# Patient Record
Sex: Female | Born: 1935
Health system: Southern US, Community
[De-identification: ages and names within clinical notes are randomized; demographics above are authoritative.]

## PROBLEM LIST (undated history)

## (undated) DIAGNOSIS — C8590 Non-Hodgkin lymphoma, unspecified, unspecified site: Secondary | ICD-10-CM

## (undated) DIAGNOSIS — I10 Essential (primary) hypertension: Secondary | ICD-10-CM

## (undated) DIAGNOSIS — Z972 Presence of dental prosthetic device (complete) (partial): Secondary | ICD-10-CM

## (undated) DIAGNOSIS — L304 Erythema intertrigo: Secondary | ICD-10-CM

## (undated) DIAGNOSIS — I5189 Other ill-defined heart diseases: Secondary | ICD-10-CM

## (undated) DIAGNOSIS — K635 Polyp of colon: Secondary | ICD-10-CM

## (undated) DIAGNOSIS — I2510 Atherosclerotic heart disease of native coronary artery without angina pectoris: Secondary | ICD-10-CM

## (undated) DIAGNOSIS — K5730 Diverticulosis of large intestine without perforation or abscess without bleeding: Secondary | ICD-10-CM

## (undated) DIAGNOSIS — C801 Malignant (primary) neoplasm, unspecified: Secondary | ICD-10-CM

## (undated) DIAGNOSIS — N200 Calculus of kidney: Secondary | ICD-10-CM

## (undated) DIAGNOSIS — I351 Nonrheumatic aortic (valve) insufficiency: Secondary | ICD-10-CM

## (undated) DIAGNOSIS — N1832 Chronic kidney disease, stage 3b: Secondary | ICD-10-CM

## (undated) DIAGNOSIS — E785 Hyperlipidemia, unspecified: Secondary | ICD-10-CM

## (undated) DIAGNOSIS — K6289 Other specified diseases of anus and rectum: Secondary | ICD-10-CM

## (undated) DIAGNOSIS — M1990 Unspecified osteoarthritis, unspecified site: Secondary | ICD-10-CM

## (undated) DIAGNOSIS — Z974 Presence of external hearing-aid: Secondary | ICD-10-CM

## (undated) DIAGNOSIS — T7840XA Allergy, unspecified, initial encounter: Secondary | ICD-10-CM

## (undated) DIAGNOSIS — Z860100 Personal history of colon polyps, unspecified: Secondary | ICD-10-CM

## (undated) DIAGNOSIS — I4891 Unspecified atrial fibrillation: Secondary | ICD-10-CM

## (undated) DIAGNOSIS — T753XXA Motion sickness, initial encounter: Secondary | ICD-10-CM

## (undated) DIAGNOSIS — Z9289 Personal history of other medical treatment: Secondary | ICD-10-CM

## (undated) DIAGNOSIS — H409 Unspecified glaucoma: Secondary | ICD-10-CM

## (undated) DIAGNOSIS — K219 Gastro-esophageal reflux disease without esophagitis: Secondary | ICD-10-CM

## (undated) DIAGNOSIS — I517 Cardiomegaly: Secondary | ICD-10-CM

## (undated) DIAGNOSIS — D649 Anemia, unspecified: Secondary | ICD-10-CM

## (undated) DIAGNOSIS — I82409 Acute embolism and thrombosis of unspecified deep veins of unspecified lower extremity: Secondary | ICD-10-CM

## (undated) HISTORY — DX: Anemia, unspecified: D64.9

## (undated) HISTORY — DX: Atherosclerotic heart disease of native coronary artery without angina pectoris: I25.10

## (undated) HISTORY — DX: Gastro-esophageal reflux disease without esophagitis: K21.9

## (undated) HISTORY — DX: Diverticulosis of large intestine without perforation or abscess without bleeding: K57.30

## (undated) HISTORY — DX: Allergy, unspecified, initial encounter: T78.40XA

## (undated) HISTORY — DX: Other specified diseases of anus and rectum: K62.89

## (undated) HISTORY — DX: Calculus of kidney: N20.0

## (undated) HISTORY — DX: Personal history of other medical treatment: Z92.89

## (undated) HISTORY — DX: Hyperlipidemia, unspecified: E78.5

## (undated) HISTORY — DX: Chronic kidney disease, stage 3b: N18.32

## (undated) HISTORY — DX: Personal history of colon polyps, unspecified: Z86.0100

## (undated) HISTORY — PX: CATARACT EXTRACTION: SUR2

## (undated) HISTORY — DX: Personal history of colonic polyps: Z86.010

## (undated) HISTORY — DX: Essential (primary) hypertension: I10

## (undated) HISTORY — PX: VAGINAL HYSTERECTOMY: SUR661

## (undated) HISTORY — DX: Acute embolism and thrombosis of unspecified deep veins of unspecified lower extremity: I82.409

## (undated) HISTORY — DX: Non-Hodgkin lymphoma, unspecified, unspecified site: C85.90

## (undated) HISTORY — DX: Polyp of colon: K63.5

## (undated) HISTORY — DX: Unspecified osteoarthritis, unspecified site: M19.90

## (undated) HISTORY — PX: CORONARY ANGIOPLASTY WITH STENT PLACEMENT: SHX49

## (undated) HISTORY — PX: OTHER SURGICAL HISTORY: SHX169

## (undated) HISTORY — DX: Unspecified glaucoma: H40.9

---
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
## 2000-05-09 ENCOUNTER — Encounter: Payer: Self-pay | Admitting: Gastroenterology

## 2001-02-28 ENCOUNTER — Other Ambulatory Visit: Admission: RE | Admit: 2001-02-28 | Discharge: 2001-02-28 | Payer: Self-pay | Admitting: Family Medicine

## 2001-09-19 ENCOUNTER — Other Ambulatory Visit: Admission: RE | Admit: 2001-09-19 | Discharge: 2001-09-19 | Payer: Self-pay | Admitting: Gastroenterology

## 2001-09-19 ENCOUNTER — Encounter (INDEPENDENT_AMBULATORY_CARE_PROVIDER_SITE_OTHER): Payer: Self-pay | Admitting: Specialist

## 2001-12-06 ENCOUNTER — Ambulatory Visit (HOSPITAL_COMMUNITY): Admission: RE | Admit: 2001-12-06 | Discharge: 2001-12-06 | Payer: Self-pay | Admitting: Gastroenterology

## 2001-12-06 ENCOUNTER — Encounter (INDEPENDENT_AMBULATORY_CARE_PROVIDER_SITE_OTHER): Payer: Self-pay | Admitting: Specialist

## 2001-12-26 ENCOUNTER — Ambulatory Visit (HOSPITAL_COMMUNITY): Admission: RE | Admit: 2001-12-26 | Discharge: 2001-12-26 | Payer: Self-pay | Admitting: Oncology

## 2001-12-26 ENCOUNTER — Encounter: Payer: Self-pay | Admitting: Oncology

## 2002-02-06 ENCOUNTER — Other Ambulatory Visit: Admission: RE | Admit: 2002-02-06 | Discharge: 2002-02-06 | Payer: Self-pay | Admitting: Oncology

## 2002-02-06 ENCOUNTER — Encounter (INDEPENDENT_AMBULATORY_CARE_PROVIDER_SITE_OTHER): Payer: Self-pay

## 2002-03-30 ENCOUNTER — Ambulatory Visit: Admission: RE | Admit: 2002-03-30 | Discharge: 2002-06-28 | Payer: Self-pay | Admitting: Radiation Oncology

## 2002-09-18 ENCOUNTER — Ambulatory Visit (HOSPITAL_COMMUNITY): Admission: RE | Admit: 2002-09-18 | Discharge: 2002-09-18 | Payer: Self-pay | Admitting: Oncology

## 2002-09-18 ENCOUNTER — Encounter: Payer: Self-pay | Admitting: Oncology

## 2003-01-22 ENCOUNTER — Ambulatory Visit (HOSPITAL_COMMUNITY): Admission: RE | Admit: 2003-01-22 | Discharge: 2003-01-22 | Payer: Self-pay | Admitting: Oncology

## 2003-01-22 ENCOUNTER — Encounter: Payer: Self-pay | Admitting: Oncology

## 2003-05-20 ENCOUNTER — Ambulatory Visit (HOSPITAL_COMMUNITY): Admission: RE | Admit: 2003-05-20 | Discharge: 2003-05-20 | Payer: Self-pay | Admitting: Oncology

## 2003-05-20 ENCOUNTER — Encounter: Payer: Self-pay | Admitting: Oncology

## 2003-07-22 ENCOUNTER — Encounter (INDEPENDENT_AMBULATORY_CARE_PROVIDER_SITE_OTHER): Payer: Self-pay | Admitting: Specialist

## 2003-07-22 ENCOUNTER — Ambulatory Visit (HOSPITAL_COMMUNITY): Admission: RE | Admit: 2003-07-22 | Discharge: 2003-07-22 | Payer: Self-pay | Admitting: Oncology

## 2003-08-20 ENCOUNTER — Encounter: Payer: Self-pay | Admitting: Oncology

## 2003-08-20 ENCOUNTER — Ambulatory Visit (HOSPITAL_COMMUNITY): Admission: RE | Admit: 2003-08-20 | Discharge: 2003-08-20 | Payer: Self-pay | Admitting: Oncology

## 2003-10-16 ENCOUNTER — Encounter: Payer: Self-pay | Admitting: Gastroenterology

## 2003-10-30 HISTORY — PX: KNEE SURGERY: SHX244

## 2003-12-17 ENCOUNTER — Ambulatory Visit (HOSPITAL_COMMUNITY): Admission: RE | Admit: 2003-12-17 | Discharge: 2003-12-17 | Payer: Self-pay | Admitting: Oncology

## 2003-12-30 ENCOUNTER — Ambulatory Visit (HOSPITAL_COMMUNITY): Admission: RE | Admit: 2003-12-30 | Discharge: 2003-12-30 | Payer: Self-pay | Admitting: Orthopedic Surgery

## 2003-12-30 ENCOUNTER — Ambulatory Visit (HOSPITAL_BASED_OUTPATIENT_CLINIC_OR_DEPARTMENT_OTHER): Admission: RE | Admit: 2003-12-30 | Discharge: 2003-12-30 | Payer: Self-pay | Admitting: Orthopedic Surgery

## 2004-03-10 ENCOUNTER — Ambulatory Visit (HOSPITAL_COMMUNITY): Admission: RE | Admit: 2004-03-10 | Discharge: 2004-03-10 | Payer: Self-pay | Admitting: Oncology

## 2004-08-31 ENCOUNTER — Ambulatory Visit (HOSPITAL_COMMUNITY): Admission: RE | Admit: 2004-08-31 | Discharge: 2004-08-31 | Payer: Self-pay | Admitting: Oncology

## 2004-09-30 ENCOUNTER — Ambulatory Visit: Payer: Self-pay | Admitting: Gastroenterology

## 2004-11-22 ENCOUNTER — Ambulatory Visit: Payer: Self-pay | Admitting: Internal Medicine

## 2004-11-23 ENCOUNTER — Inpatient Hospital Stay (HOSPITAL_COMMUNITY): Admission: EM | Admit: 2004-11-23 | Discharge: 2004-11-25 | Payer: Self-pay | Admitting: Emergency Medicine

## 2004-11-23 ENCOUNTER — Ambulatory Visit: Payer: Self-pay | Admitting: Cardiology

## 2004-12-01 ENCOUNTER — Ambulatory Visit: Payer: Self-pay

## 2004-12-01 ENCOUNTER — Ambulatory Visit: Payer: Self-pay | Admitting: Cardiology

## 2004-12-05 ENCOUNTER — Ambulatory Visit: Payer: Self-pay | Admitting: Family Medicine

## 2004-12-07 ENCOUNTER — Ambulatory Visit: Payer: Self-pay | Admitting: Internal Medicine

## 2004-12-07 ENCOUNTER — Ambulatory Visit: Payer: Self-pay | Admitting: Cardiology

## 2004-12-14 ENCOUNTER — Ambulatory Visit: Payer: Self-pay | Admitting: Internal Medicine

## 2004-12-17 ENCOUNTER — Ambulatory Visit: Payer: Self-pay | Admitting: Internal Medicine

## 2004-12-28 ENCOUNTER — Ambulatory Visit: Payer: Self-pay | Admitting: Oncology

## 2004-12-30 ENCOUNTER — Ambulatory Visit (HOSPITAL_COMMUNITY): Admission: RE | Admit: 2004-12-30 | Discharge: 2004-12-30 | Payer: Self-pay | Admitting: Oncology

## 2005-01-18 ENCOUNTER — Ambulatory Visit: Payer: Self-pay | Admitting: Internal Medicine

## 2005-02-01 ENCOUNTER — Ambulatory Visit: Payer: Self-pay | Admitting: Family Medicine

## 2005-02-19 ENCOUNTER — Ambulatory Visit: Payer: Self-pay | Admitting: Cardiology

## 2005-02-26 ENCOUNTER — Ambulatory Visit: Payer: Self-pay | Admitting: Cardiology

## 2005-04-19 ENCOUNTER — Ambulatory Visit: Payer: Self-pay | Admitting: Cardiology

## 2005-05-26 ENCOUNTER — Ambulatory Visit: Payer: Self-pay | Admitting: Family Medicine

## 2005-06-14 ENCOUNTER — Ambulatory Visit: Payer: Self-pay | Admitting: Internal Medicine

## 2005-06-25 ENCOUNTER — Ambulatory Visit: Payer: Self-pay | Admitting: Oncology

## 2005-06-30 ENCOUNTER — Ambulatory Visit (HOSPITAL_COMMUNITY): Admission: RE | Admit: 2005-06-30 | Discharge: 2005-06-30 | Payer: Self-pay | Admitting: Oncology

## 2005-07-05 ENCOUNTER — Ambulatory Visit: Payer: Self-pay | Admitting: Family Medicine

## 2005-08-06 ENCOUNTER — Ambulatory Visit: Payer: Self-pay | Admitting: Family Medicine

## 2005-08-16 ENCOUNTER — Ambulatory Visit: Payer: Self-pay | Admitting: Family Medicine

## 2005-08-25 ENCOUNTER — Ambulatory Visit: Payer: Self-pay | Admitting: Cardiology

## 2005-09-01 ENCOUNTER — Ambulatory Visit: Payer: Self-pay | Admitting: Cardiology

## 2005-09-13 ENCOUNTER — Ambulatory Visit: Payer: Self-pay | Admitting: Family Medicine

## 2005-10-01 ENCOUNTER — Ambulatory Visit: Payer: Self-pay | Admitting: Gastroenterology

## 2005-10-18 ENCOUNTER — Ambulatory Visit: Payer: Self-pay | Admitting: Cardiology

## 2005-10-18 ENCOUNTER — Ambulatory Visit: Payer: Self-pay | Admitting: Family Medicine

## 2005-10-25 ENCOUNTER — Ambulatory Visit: Payer: Self-pay | Admitting: Gastroenterology

## 2005-11-15 ENCOUNTER — Ambulatory Visit: Payer: Self-pay | Admitting: Cardiology

## 2005-12-06 ENCOUNTER — Ambulatory Visit: Payer: Self-pay | Admitting: Family Medicine

## 2005-12-27 ENCOUNTER — Ambulatory Visit: Payer: Self-pay | Admitting: Oncology

## 2005-12-29 ENCOUNTER — Ambulatory Visit (HOSPITAL_COMMUNITY): Admission: RE | Admit: 2005-12-29 | Discharge: 2005-12-29 | Payer: Self-pay | Admitting: Oncology

## 2006-01-10 ENCOUNTER — Ambulatory Visit: Payer: Self-pay | Admitting: Family Medicine

## 2006-01-28 ENCOUNTER — Ambulatory Visit: Payer: Self-pay | Admitting: Family Medicine

## 2006-02-23 ENCOUNTER — Ambulatory Visit: Payer: Self-pay | Admitting: Family Medicine

## 2006-03-29 ENCOUNTER — Ambulatory Visit: Payer: Self-pay | Admitting: Family Medicine

## 2006-03-30 ENCOUNTER — Ambulatory Visit: Payer: Self-pay | Admitting: Family Medicine

## 2006-05-16 ENCOUNTER — Ambulatory Visit: Payer: Self-pay | Admitting: Family Medicine

## 2006-06-27 ENCOUNTER — Ambulatory Visit: Payer: Self-pay | Admitting: Oncology

## 2006-06-28 LAB — CBC WITH DIFFERENTIAL/PLATELET
Eosinophils Absolute: 0.1 10*3/uL (ref 0.0–0.5)
MCV: 95.6 fL (ref 81.0–101.0)
MONO%: 8.6 % (ref 0.0–13.0)
NEUT#: 1.8 10*3/uL (ref 1.5–6.5)
RBC: 4.22 10*6/uL (ref 3.70–5.32)
RDW: 13.9 % (ref 11.3–14.5)
WBC: 4.5 10*3/uL (ref 3.9–10.0)

## 2006-06-29 ENCOUNTER — Ambulatory Visit (HOSPITAL_COMMUNITY): Admission: RE | Admit: 2006-06-29 | Discharge: 2006-06-29 | Payer: Self-pay | Admitting: Oncology

## 2006-06-30 LAB — COMPREHENSIVE METABOLIC PANEL
ALT: 39 U/L (ref 0–40)
AST: 31 U/L (ref 0–37)
Albumin: 4.6 g/dL (ref 3.5–5.2)
Alkaline Phosphatase: 60 U/L (ref 39–117)
Glucose, Bld: 170 mg/dL — ABNORMAL HIGH (ref 70–99)
Potassium: 4.5 mEq/L (ref 3.5–5.3)
Sodium: 140 mEq/L (ref 135–145)
Total Protein: 7.3 g/dL (ref 6.0–8.3)

## 2006-06-30 LAB — PROTEIN ELECTROPHORESIS, SERUM
Beta 2: 4.9 % (ref 3.2–6.5)
Beta Globulin: 6.4 % (ref 4.7–7.2)
Gamma Globulin: 13.2 % (ref 11.1–18.8)
M-Spike, %: 0.17 g/dL
Total Protein, Serum Electrophoresis: 7.3 g/dL (ref 6.0–8.3)

## 2006-08-02 ENCOUNTER — Ambulatory Visit: Payer: Self-pay | Admitting: Family Medicine

## 2006-08-10 ENCOUNTER — Ambulatory Visit: Payer: Self-pay | Admitting: Cardiology

## 2006-09-05 ENCOUNTER — Ambulatory Visit: Payer: Self-pay | Admitting: Family Medicine

## 2006-09-06 ENCOUNTER — Ambulatory Visit: Payer: Self-pay | Admitting: Cardiology

## 2006-09-13 ENCOUNTER — Ambulatory Visit: Payer: Self-pay | Admitting: Cardiology

## 2006-09-30 ENCOUNTER — Ambulatory Visit: Payer: Self-pay | Admitting: Family Medicine

## 2006-12-21 ENCOUNTER — Ambulatory Visit: Payer: Self-pay | Admitting: Oncology

## 2006-12-26 LAB — CBC WITH DIFFERENTIAL/PLATELET
EOS%: 1.6 % (ref 0.0–7.0)
MCH: 33.2 pg (ref 26.0–34.0)
MCV: 96.4 fL (ref 81.0–101.0)
MONO%: 7.7 % (ref 0.0–13.0)
NEUT#: 2 10*3/uL (ref 1.5–6.5)
RBC: 4.2 10*6/uL (ref 3.70–5.32)
RDW: 12.8 % (ref 11.3–14.5)

## 2006-12-26 LAB — ERYTHROCYTE SEDIMENTATION RATE: Sed Rate: 7 mm/hr (ref 0–30)

## 2006-12-28 LAB — COMPREHENSIVE METABOLIC PANEL
ALT: 45 U/L — ABNORMAL HIGH (ref 0–35)
AST: 36 U/L (ref 0–37)
Alkaline Phosphatase: 66 U/L (ref 39–117)
Sodium: 143 mEq/L (ref 135–145)
Total Bilirubin: 0.6 mg/dL (ref 0.3–1.2)
Total Protein: 7.1 g/dL (ref 6.0–8.3)

## 2006-12-28 LAB — SPEP & IFE WITH QIG
Alpha-1-Globulin: 3.4 % (ref 2.9–4.9)
Alpha-2-Globulin: 10.7 % (ref 7.1–11.8)
Total Protein, Serum Electrophoresis: 7.1 g/dL (ref 6.0–8.3)

## 2006-12-28 LAB — LACTATE DEHYDROGENASE: LDH: 167 U/L (ref 94–250)

## 2006-12-29 ENCOUNTER — Encounter: Payer: Self-pay | Admitting: Family Medicine

## 2006-12-29 ENCOUNTER — Ambulatory Visit (HOSPITAL_COMMUNITY): Admission: RE | Admit: 2006-12-29 | Discharge: 2006-12-29 | Payer: Self-pay | Admitting: Oncology

## 2007-01-24 ENCOUNTER — Ambulatory Visit: Payer: Self-pay | Admitting: Gastroenterology

## 2007-01-24 LAB — CONVERTED CEMR LAB: Total Protein: 7.2 g/dL (ref 6.0–8.3)

## 2007-02-09 ENCOUNTER — Ambulatory Visit: Payer: Self-pay | Admitting: Gastroenterology

## 2007-02-20 ENCOUNTER — Ambulatory Visit: Payer: Self-pay | Admitting: Family Medicine

## 2007-02-23 ENCOUNTER — Ambulatory Visit: Payer: Self-pay | Admitting: Gastroenterology

## 2007-02-23 LAB — CONVERTED CEMR LAB
Fecal Occult Blood: NEGATIVE
OCCULT 2: NEGATIVE
OCCULT 3: NEGATIVE
OCCULT 4: NEGATIVE
OCCULT 5: NEGATIVE

## 2007-03-06 ENCOUNTER — Ambulatory Visit: Payer: Self-pay | Admitting: Family Medicine

## 2007-04-14 ENCOUNTER — Encounter (INDEPENDENT_AMBULATORY_CARE_PROVIDER_SITE_OTHER): Payer: Self-pay | Admitting: Internal Medicine

## 2007-04-14 LAB — CONVERTED CEMR LAB: Pap Smear: NORMAL

## 2007-04-17 ENCOUNTER — Telehealth (INDEPENDENT_AMBULATORY_CARE_PROVIDER_SITE_OTHER): Payer: Self-pay | Admitting: *Deleted

## 2007-04-21 ENCOUNTER — Ambulatory Visit: Payer: Self-pay | Admitting: Internal Medicine

## 2007-04-21 DIAGNOSIS — S139XXA Sprain of joints and ligaments of unspecified parts of neck, initial encounter: Secondary | ICD-10-CM

## 2007-05-08 ENCOUNTER — Telehealth (INDEPENDENT_AMBULATORY_CARE_PROVIDER_SITE_OTHER): Payer: Self-pay | Admitting: *Deleted

## 2007-05-17 ENCOUNTER — Telehealth (INDEPENDENT_AMBULATORY_CARE_PROVIDER_SITE_OTHER): Payer: Self-pay | Admitting: Internal Medicine

## 2007-06-22 ENCOUNTER — Ambulatory Visit: Payer: Self-pay | Admitting: Oncology

## 2007-06-26 LAB — CBC WITH DIFFERENTIAL/PLATELET
BASO%: 0.3 % (ref 0.0–2.0)
EOS%: 0.9 % (ref 0.0–7.0)
HCT: 38.1 % (ref 34.8–46.6)
HGB: 13.6 g/dL (ref 11.6–15.9)
MCH: 34.3 pg — ABNORMAL HIGH (ref 26.0–34.0)
MCHC: 35.6 g/dL (ref 32.0–36.0)
MONO#: 0.3 10*3/uL (ref 0.1–0.9)
NEUT%: 46 % (ref 39.6–76.8)
RDW: 13.1 % (ref 11.3–14.5)
WBC: 4.6 10*3/uL (ref 3.9–10.0)
lymph#: 2.1 10*3/uL (ref 0.9–3.3)

## 2007-06-27 LAB — COMPREHENSIVE METABOLIC PANEL
ALT: 60 U/L — ABNORMAL HIGH (ref 0–35)
AST: 57 U/L — ABNORMAL HIGH (ref 0–37)
Albumin: 4.3 g/dL (ref 3.5–5.2)
CO2: 25 mEq/L (ref 19–32)
Calcium: 9.8 mg/dL (ref 8.4–10.5)
Chloride: 102 mEq/L (ref 96–112)
Creatinine, Ser: 1.3 mg/dL — ABNORMAL HIGH (ref 0.40–1.20)
Potassium: 5.1 mEq/L (ref 3.5–5.3)
Total Protein: 7.1 g/dL (ref 6.0–8.3)

## 2007-06-27 LAB — BETA 2 MICROGLOBULIN, SERUM: Beta-2 Microglobulin: 2.46 mg/L — ABNORMAL HIGH (ref 1.01–1.73)

## 2007-06-28 ENCOUNTER — Encounter (INDEPENDENT_AMBULATORY_CARE_PROVIDER_SITE_OTHER): Payer: Self-pay | Admitting: Internal Medicine

## 2007-06-28 ENCOUNTER — Ambulatory Visit (HOSPITAL_COMMUNITY): Admission: RE | Admit: 2007-06-28 | Discharge: 2007-06-28 | Payer: Self-pay | Admitting: Oncology

## 2007-06-29 ENCOUNTER — Ambulatory Visit: Payer: Self-pay | Admitting: Family Medicine

## 2007-06-29 DIAGNOSIS — M545 Low back pain: Secondary | ICD-10-CM

## 2007-06-30 ENCOUNTER — Encounter: Payer: Self-pay | Admitting: Family Medicine

## 2007-07-03 ENCOUNTER — Encounter: Payer: Self-pay | Admitting: Family Medicine

## 2007-08-07 ENCOUNTER — Telehealth (INDEPENDENT_AMBULATORY_CARE_PROVIDER_SITE_OTHER): Payer: Self-pay | Admitting: Internal Medicine

## 2007-08-09 ENCOUNTER — Ambulatory Visit: Payer: Self-pay | Admitting: Internal Medicine

## 2007-08-09 DIAGNOSIS — R945 Abnormal results of liver function studies: Secondary | ICD-10-CM

## 2007-08-18 ENCOUNTER — Telehealth (INDEPENDENT_AMBULATORY_CARE_PROVIDER_SITE_OTHER): Payer: Self-pay | Admitting: Internal Medicine

## 2007-08-24 LAB — CONVERTED CEMR LAB
AST: 66 units/L — ABNORMAL HIGH (ref 0–37)
Bilirubin, Direct: 0.1 mg/dL (ref 0.0–0.3)
Total Bilirubin: 0.9 mg/dL (ref 0.3–1.2)

## 2007-08-28 ENCOUNTER — Encounter (INDEPENDENT_AMBULATORY_CARE_PROVIDER_SITE_OTHER): Payer: Self-pay | Admitting: Internal Medicine

## 2007-08-28 DIAGNOSIS — I059 Rheumatic mitral valve disease, unspecified: Secondary | ICD-10-CM | POA: Insufficient documentation

## 2007-08-28 DIAGNOSIS — R079 Chest pain, unspecified: Secondary | ICD-10-CM

## 2007-08-28 DIAGNOSIS — N6019 Diffuse cystic mastopathy of unspecified breast: Secondary | ICD-10-CM | POA: Insufficient documentation

## 2007-08-28 DIAGNOSIS — C8583 Other specified types of non-Hodgkin lymphoma, intra-abdominal lymph nodes: Secondary | ICD-10-CM | POA: Insufficient documentation

## 2007-08-28 DIAGNOSIS — E7800 Pure hypercholesterolemia, unspecified: Secondary | ICD-10-CM

## 2007-08-28 DIAGNOSIS — I10 Essential (primary) hypertension: Secondary | ICD-10-CM | POA: Insufficient documentation

## 2007-08-28 DIAGNOSIS — K589 Irritable bowel syndrome without diarrhea: Secondary | ICD-10-CM

## 2007-08-28 DIAGNOSIS — K219 Gastro-esophageal reflux disease without esophagitis: Secondary | ICD-10-CM

## 2007-08-28 DIAGNOSIS — H3320 Serous retinal detachment, unspecified eye: Secondary | ICD-10-CM | POA: Insufficient documentation

## 2007-08-28 DIAGNOSIS — K5289 Other specified noninfective gastroenteritis and colitis: Secondary | ICD-10-CM

## 2007-08-28 DIAGNOSIS — I2510 Atherosclerotic heart disease of native coronary artery without angina pectoris: Secondary | ICD-10-CM | POA: Insufficient documentation

## 2007-08-31 ENCOUNTER — Ambulatory Visit: Payer: Self-pay | Admitting: Family Medicine

## 2007-10-03 ENCOUNTER — Ambulatory Visit: Payer: Self-pay | Admitting: Internal Medicine

## 2007-10-03 ENCOUNTER — Ambulatory Visit: Payer: Self-pay | Admitting: Family Medicine

## 2007-10-03 DIAGNOSIS — M25529 Pain in unspecified elbow: Secondary | ICD-10-CM | POA: Insufficient documentation

## 2007-10-03 DIAGNOSIS — M25559 Pain in unspecified hip: Secondary | ICD-10-CM

## 2007-10-23 ENCOUNTER — Encounter (INDEPENDENT_AMBULATORY_CARE_PROVIDER_SITE_OTHER): Payer: Self-pay | Admitting: Internal Medicine

## 2007-11-24 ENCOUNTER — Ambulatory Visit: Payer: Self-pay | Admitting: Family Medicine

## 2007-11-24 DIAGNOSIS — N390 Urinary tract infection, site not specified: Secondary | ICD-10-CM

## 2007-11-24 LAB — CONVERTED CEMR LAB
Bilirubin Urine: NEGATIVE
Casts: 0 /lpf
Ketones, urine, test strip: NEGATIVE

## 2007-12-02 ENCOUNTER — Ambulatory Visit: Payer: Self-pay | Admitting: Family Medicine

## 2007-12-02 DIAGNOSIS — T7840XA Allergy, unspecified, initial encounter: Secondary | ICD-10-CM | POA: Insufficient documentation

## 2007-12-08 ENCOUNTER — Telehealth (INDEPENDENT_AMBULATORY_CARE_PROVIDER_SITE_OTHER): Payer: Self-pay | Admitting: Internal Medicine

## 2007-12-08 ENCOUNTER — Encounter: Payer: Self-pay | Admitting: Family Medicine

## 2007-12-18 ENCOUNTER — Ambulatory Visit: Payer: Self-pay | Admitting: Cardiology

## 2007-12-21 ENCOUNTER — Ambulatory Visit: Payer: Self-pay | Admitting: Oncology

## 2007-12-25 ENCOUNTER — Ambulatory Visit: Payer: Self-pay | Admitting: Cardiology

## 2007-12-25 LAB — CONVERTED CEMR LAB
Direct LDL: 169.6 mg/dL
HDL: 63.9 mg/dL (ref >39.0)
Total CHOL/HDL Ratio: 3.9
Triglycerides: 123 mg/dL (ref 0–149)
VLDL: 25 mg/dL (ref 0–40)

## 2007-12-25 LAB — CBC WITH DIFFERENTIAL/PLATELET
BASO%: 0 % (ref 0.0–2.0)
EOS%: 1.5 % (ref 0.0–7.0)
Eosinophils Absolute: 0.1 10*3/uL (ref 0.0–0.5)
LYMPH%: 43.3 % (ref 14.0–48.0)
MCH: 33.6 pg (ref 26.0–34.0)
MCHC: 34.7 g/dL (ref 32.0–36.0)
MCV: 96.9 fL (ref 81.0–101.0)
MONO%: 5.9 % (ref 0.0–13.0)
Platelets: 192 10*3/uL (ref 145–400)
RBC: 4.34 10*6/uL (ref 3.70–5.32)
RDW: 13.2 % (ref 11.3–14.5)

## 2007-12-25 LAB — COMPREHENSIVE METABOLIC PANEL
AST: 16 U/L (ref 0–37)
Albumin: 4.5 g/dL (ref 3.5–5.2)
Alkaline Phosphatase: 58 U/L (ref 39–117)
Potassium: 5.8 mEq/L — ABNORMAL HIGH (ref 3.5–5.3)
Sodium: 141 mEq/L (ref 135–145)
Total Protein: 7.3 g/dL (ref 6.0–8.3)

## 2007-12-27 ENCOUNTER — Ambulatory Visit (HOSPITAL_COMMUNITY)
Admission: RE | Admit: 2007-12-27 | Discharge: 2007-12-27 | Payer: Self-pay | Admitting: Physical Medicine and Rehabilitation

## 2007-12-27 ENCOUNTER — Ambulatory Visit (HOSPITAL_COMMUNITY): Admission: RE | Admit: 2007-12-27 | Discharge: 2007-12-27 | Payer: Self-pay | Admitting: Oncology

## 2007-12-29 ENCOUNTER — Encounter (INDEPENDENT_AMBULATORY_CARE_PROVIDER_SITE_OTHER): Payer: Self-pay | Admitting: Internal Medicine

## 2008-01-01 ENCOUNTER — Encounter: Payer: Self-pay | Admitting: Family Medicine

## 2008-01-04 ENCOUNTER — Encounter (INDEPENDENT_AMBULATORY_CARE_PROVIDER_SITE_OTHER): Payer: Self-pay | Admitting: Internal Medicine

## 2008-01-04 ENCOUNTER — Ambulatory Visit: Payer: Self-pay | Admitting: Family Medicine

## 2008-01-04 DIAGNOSIS — E878 Other disorders of electrolyte and fluid balance, not elsewhere classified: Secondary | ICD-10-CM | POA: Insufficient documentation

## 2008-01-04 LAB — CONVERTED CEMR LAB: Chloride: 100 meq/L (ref 96–112)

## 2008-01-10 ENCOUNTER — Ambulatory Visit: Payer: Self-pay | Admitting: Family Medicine

## 2008-01-10 DIAGNOSIS — J019 Acute sinusitis, unspecified: Secondary | ICD-10-CM | POA: Insufficient documentation

## 2008-01-15 IMAGING — CT CT NECK W/ CM
3 of 8 series · 12 of 33 positions shown, 14 images · IV contrast (omnipaque)
Comparison: 06/29/2006

CLINICAL DATA: Rectal cancer. Non-Hodgkin's lymphoma.
TECHNIQUE: Multidetector CT imaging of the neck, chest, abdomen and pelvis was
performed following the standard protocol during administration of intravenous
contrast.

Contrast:  125 cc Omnipaque 300
NECK CT WITH CONTRAST:
There is no evidence for lymphadenopathy in the neck. Chronic sinus disease in
the left maxillary sinus is stable. The parotid and submandibular glands are
symmetric.

[Series 2: cap 5.0 b40f · axial · 0.77mm/px · z∈[-708,-268]mm · 5 of 134 slices shown, 7 images]
[im 23/134  soft-tissue]
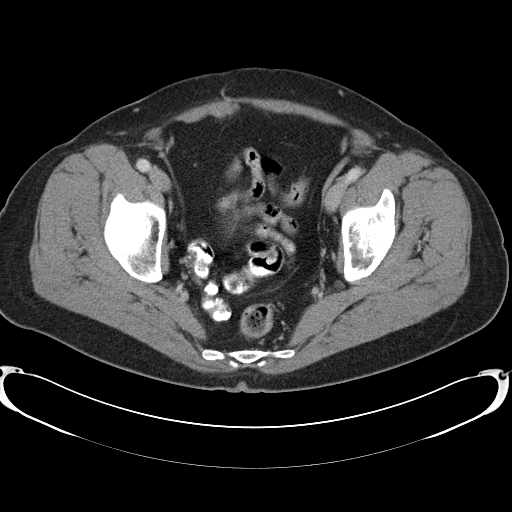
[im 23/134  bone]
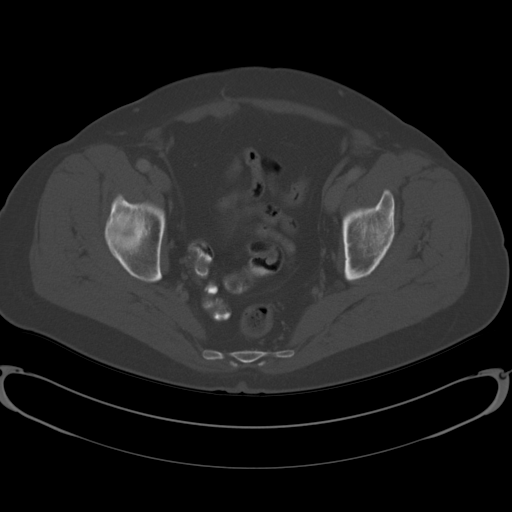
[im 45/134  bone]
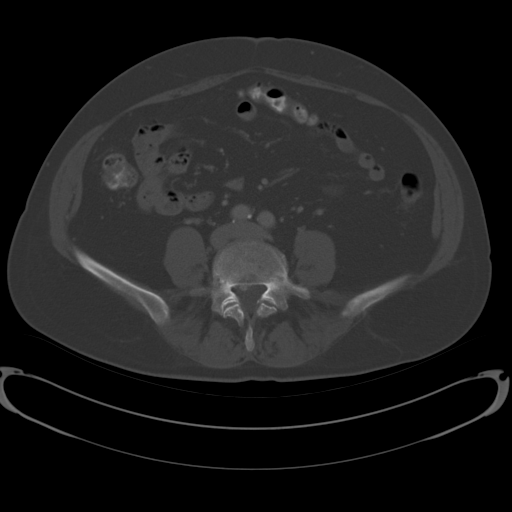
[im 67/134  bone]
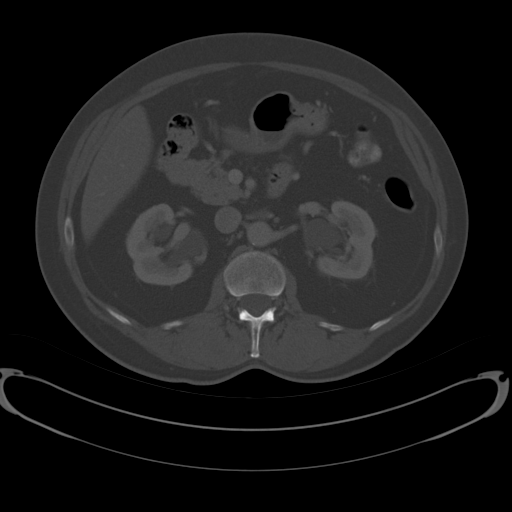
[im 89/134  bone]
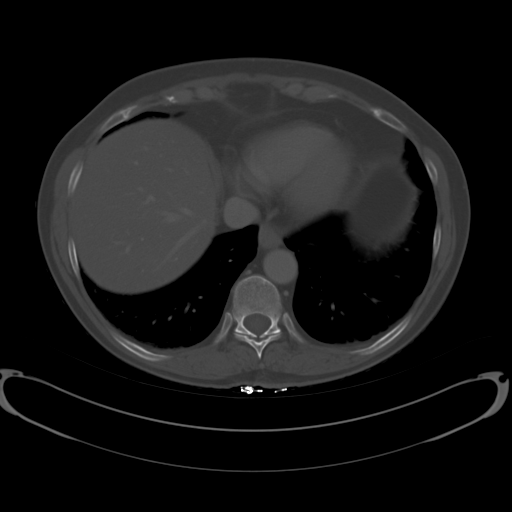
[im 111/134  soft-tissue]
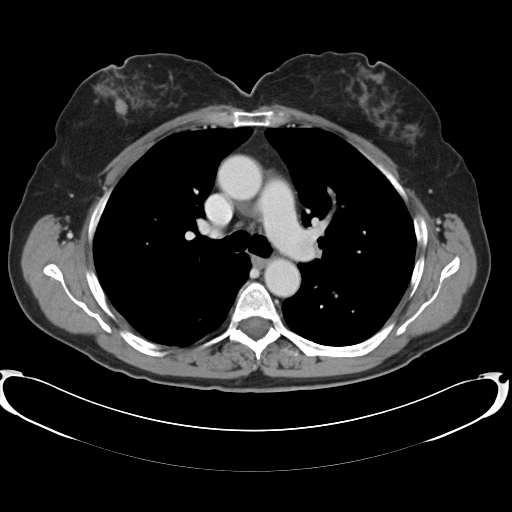
[im 111/134  bone]
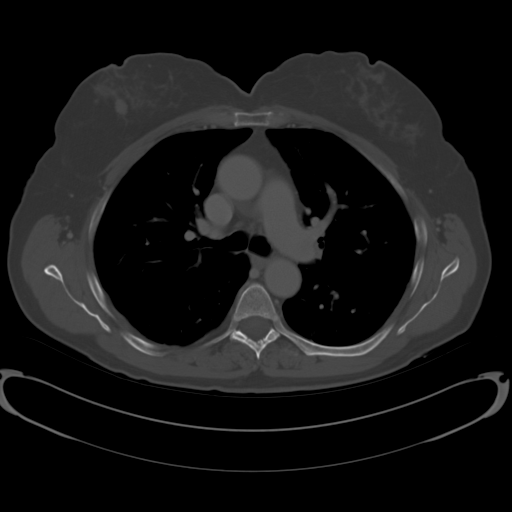

[Series 604: coronal cap images · coronal · 1.30mm/px · 2 of 83 slices shown]
[im 28/83  bone]
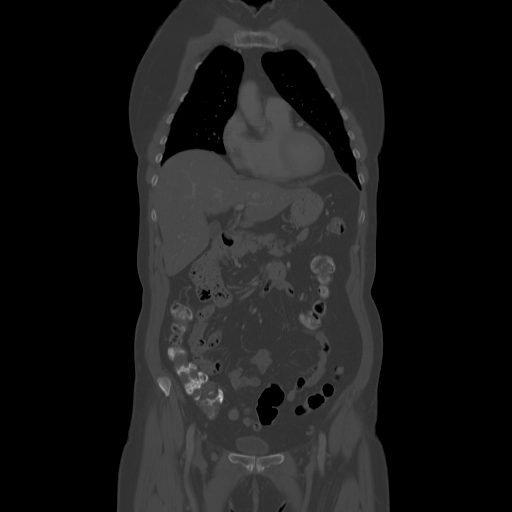
[im 55/83  bone]
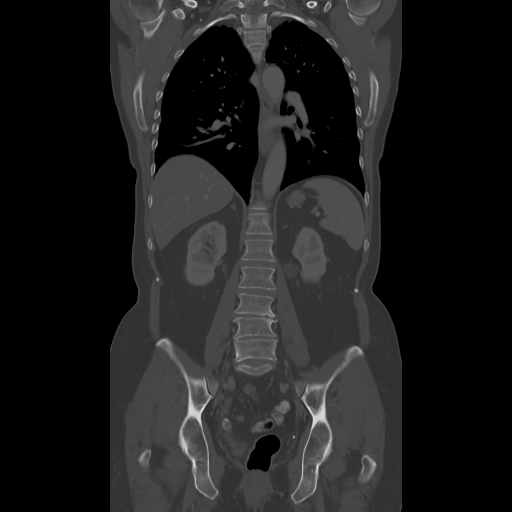

[Series 605: sagittal cap images · sagittal · 1.30mm/px · 5 of 105 slices shown]
[im 15/105  bone]
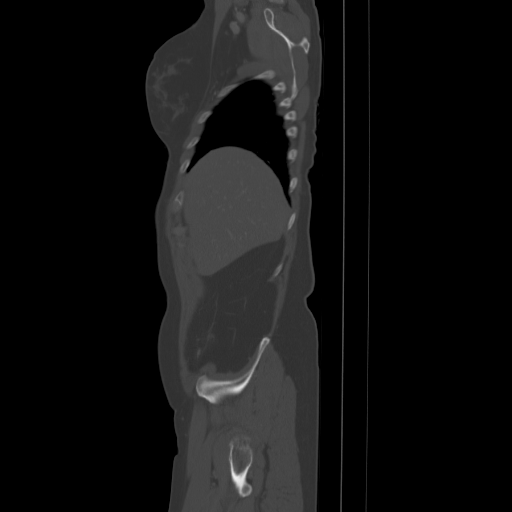
[im 30/105  bone]
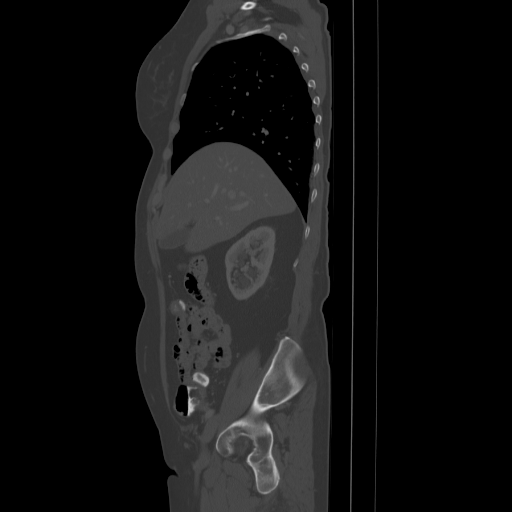
[im 45/105  bone]
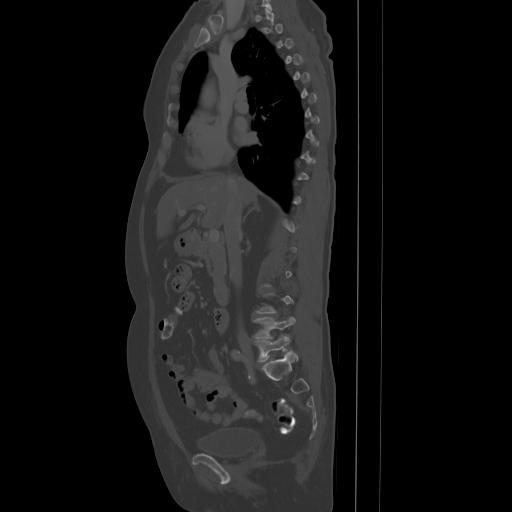
[im 60/105  bone]
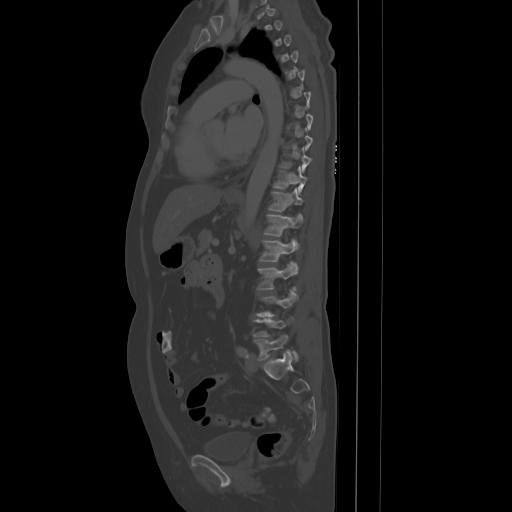
[im 75/105  bone]
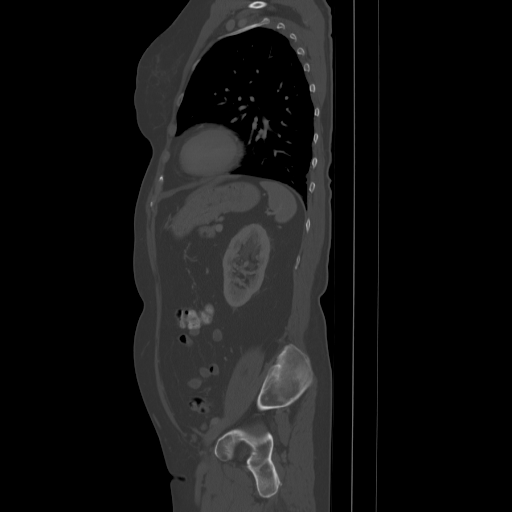

[12 of 33 positions shown; findings below may reference images not displayed]

IMPRESSION: Stable exam. No lymphadenopathy.

CHEST CT WITH CONTRAST:

No axillary, mediastinal, or hilar lymphadenopathy. Heart size is upper normal.
There is no pericardial or pleural effusion. Coronary artery calcification is
apparent. The lungs are clear bilaterally.

Bony structures are unremarkable.
IMPRESSION: Stable. No evidence for lymphadenopathy in the chest.

ABDOMEN CT WITH CONTRAST:

The liver is fatty infiltrated without focal abnormality. The spleen, stomach,
duodenum, pancreas, gallbladder, and adrenal glands have normal imaging
features. Large central sinus cysts are again noted in the kidneys. There is no
evidence for intraperitoneal free fluid. No abdominal lymphadenopathy.
IMPRESSION: Stable exam. No evidence for lymphadenopathy.

PELVIS CT WITH CONTRAST:

No intraperitoneal free fluid. There is no pelvic lymphadenopathy. Diverticular
changes seen in the sigmoid colon without diverticulitis. No worrisome or
asymmetric wall thickening is identified in the colon. There is no evidence for
a perirectal mass lesion.

A small right inguinal hernia contains only fat. Patient is status post
hysterectomy. No adnexal mass.

Bone windows demonstrate bilateral pars defects at the level of L5 with diffuse
mild degenerative change in the lumbar spine.
IMPRESSION: No evidence for metastatic disease or lymphadenopathy in the anatomic pelvis.

## 2008-02-07 ENCOUNTER — Encounter (INDEPENDENT_AMBULATORY_CARE_PROVIDER_SITE_OTHER): Payer: Self-pay | Admitting: Internal Medicine

## 2008-02-12 ENCOUNTER — Encounter (INDEPENDENT_AMBULATORY_CARE_PROVIDER_SITE_OTHER): Payer: Self-pay | Admitting: Internal Medicine

## 2008-02-14 ENCOUNTER — Ambulatory Visit: Payer: Self-pay | Admitting: Cardiology

## 2008-02-14 LAB — CONVERTED CEMR LAB
ALT: 29 units/L (ref 0–35)
Alkaline Phosphatase: 63 units/L (ref 39–117)
Total CHOL/HDL Ratio: 5.3
Triglycerides: 279 mg/dL (ref 0–149)
VLDL: 56 mg/dL — ABNORMAL HIGH (ref 0–40)

## 2008-02-29 ENCOUNTER — Ambulatory Visit: Payer: Self-pay | Admitting: Cardiology

## 2008-02-29 LAB — CONVERTED CEMR LAB
BUN: 12 mg/dL (ref 6–23)
CO2: 32 meq/L (ref 19–32)
Eosinophils Relative: 1.5 % (ref 0.0–5.0)
GFR calc Af Amer: 56 mL/min
Glucose, Bld: 91 mg/dL (ref 70–99)
HCT: 44.1 % (ref 36.0–46.0)
Hemoglobin: 14.1 g/dL (ref 12.0–15.0)
INR: 1 (ref 0.8–1.0)
Lymphocytes Relative: 46 % (ref 12.0–46.0)
Monocytes Absolute: 0.6 10*3/uL (ref 0.1–1.0)
Monocytes Relative: 10.2 % (ref 3.0–12.0)
Neutro Abs: 2.4 10*3/uL (ref 1.4–7.7)
Platelets: 242 10*3/uL (ref 150–400)
Potassium: 4.9 meq/L (ref 3.5–5.1)
Prothrombin Time: 12.4 s (ref 10.9–13.3)
RDW: 12.3 % (ref 11.5–14.6)
Sodium: 143 meq/L (ref 135–145)
WBC: 5.6 10*3/uL (ref 4.5–10.5)

## 2008-03-04 ENCOUNTER — Ambulatory Visit: Payer: Self-pay | Admitting: Cardiology

## 2008-03-04 ENCOUNTER — Inpatient Hospital Stay (HOSPITAL_BASED_OUTPATIENT_CLINIC_OR_DEPARTMENT_OTHER): Admission: RE | Admit: 2008-03-04 | Discharge: 2008-03-04 | Payer: Self-pay | Admitting: Cardiology

## 2008-03-07 ENCOUNTER — Ambulatory Visit: Payer: Self-pay | Admitting: Family Medicine

## 2008-03-07 ENCOUNTER — Encounter (INDEPENDENT_AMBULATORY_CARE_PROVIDER_SITE_OTHER): Payer: Self-pay | Admitting: Internal Medicine

## 2008-03-08 ENCOUNTER — Inpatient Hospital Stay (HOSPITAL_COMMUNITY): Admission: RE | Admit: 2008-03-08 | Discharge: 2008-03-10 | Payer: Self-pay | Admitting: Cardiology

## 2008-03-08 ENCOUNTER — Encounter (INDEPENDENT_AMBULATORY_CARE_PROVIDER_SITE_OTHER): Payer: Self-pay | Admitting: Internal Medicine

## 2008-03-08 ENCOUNTER — Ambulatory Visit: Payer: Self-pay | Admitting: Cardiology

## 2008-03-11 ENCOUNTER — Emergency Department: Payer: BC Managed Care – PPO | Admitting: Emergency Medicine

## 2008-03-12 ENCOUNTER — Telehealth (INDEPENDENT_AMBULATORY_CARE_PROVIDER_SITE_OTHER): Payer: Self-pay | Admitting: Internal Medicine

## 2008-03-12 ENCOUNTER — Encounter: Payer: Self-pay | Admitting: Cardiovascular Disease

## 2008-03-12 ENCOUNTER — Ambulatory Visit: Payer: Self-pay

## 2008-03-13 ENCOUNTER — Ambulatory Visit: Payer: Self-pay | Admitting: Family Medicine

## 2008-03-27 ENCOUNTER — Ambulatory Visit: Payer: Self-pay | Admitting: Cardiology

## 2008-03-27 LAB — CONVERTED CEMR LAB
Basophils Absolute: 0 10*3/uL (ref 0.0–0.1)
Basophils Relative: 0.1 % (ref 0.0–1.0)
Eosinophils Absolute: 0 10*3/uL (ref 0.0–0.7)
Eosinophils Relative: 0.8 % (ref 0.0–5.0)
HCT: 43.2 % (ref 36.0–46.0)
MCHC: 33.1 g/dL (ref 30.0–36.0)
MCV: 100.5 fL — ABNORMAL HIGH (ref 78.0–100.0)
Monocytes Absolute: 0.4 10*3/uL (ref 0.1–1.0)
Neutro Abs: 3 10*3/uL (ref 1.4–7.7)
RBC: 4.3 M/uL (ref 3.87–5.11)
WBC: 4.8 10*3/uL (ref 4.5–10.5)

## 2008-04-01 ENCOUNTER — Ambulatory Visit: Payer: Self-pay | Admitting: Cardiology

## 2008-04-03 ENCOUNTER — Ambulatory Visit: Payer: Self-pay | Admitting: Cardiology

## 2008-04-03 LAB — CONVERTED CEMR LAB: Direct LDL: 130 mg/dL

## 2008-04-30 ENCOUNTER — Ambulatory Visit: Payer: Self-pay | Admitting: Family Medicine

## 2008-04-30 DIAGNOSIS — M546 Pain in thoracic spine: Secondary | ICD-10-CM

## 2008-04-30 DIAGNOSIS — L501 Idiopathic urticaria: Secondary | ICD-10-CM

## 2008-05-01 ENCOUNTER — Encounter (INDEPENDENT_AMBULATORY_CARE_PROVIDER_SITE_OTHER): Payer: Self-pay | Admitting: Internal Medicine

## 2008-05-06 ENCOUNTER — Ambulatory Visit: Payer: Self-pay | Admitting: Cardiology

## 2008-05-06 ENCOUNTER — Encounter (INDEPENDENT_AMBULATORY_CARE_PROVIDER_SITE_OTHER): Payer: Self-pay | Admitting: Internal Medicine

## 2008-05-06 LAB — CONVERTED CEMR LAB
Albumin: 4 g/dL (ref 3.5–5.2)
Alkaline Phosphatase: 75 units/L (ref 39–117)
Cholesterol: 203 mg/dL (ref 0–200)
Direct LDL: 97.8 mg/dL
HDL: 41.6 mg/dL (ref >39.0)
Total CHOL/HDL Ratio: 4.9
Total Protein: 7 g/dL (ref 6.0–8.3)
Triglycerides: 320 mg/dL (ref 0–149)
VLDL: 64 mg/dL — ABNORMAL HIGH (ref 0–40)

## 2008-06-03 ENCOUNTER — Ambulatory Visit: Payer: Self-pay | Admitting: Cardiology

## 2008-07-30 ENCOUNTER — Ambulatory Visit: Payer: Self-pay | Admitting: Cardiology

## 2008-08-06 ENCOUNTER — Ambulatory Visit: Payer: Self-pay

## 2008-08-21 ENCOUNTER — Encounter (INDEPENDENT_AMBULATORY_CARE_PROVIDER_SITE_OTHER): Payer: Self-pay | Admitting: Internal Medicine

## 2008-08-28 ENCOUNTER — Telehealth: Payer: Self-pay | Admitting: Family Medicine

## 2008-08-29 ENCOUNTER — Encounter (INDEPENDENT_AMBULATORY_CARE_PROVIDER_SITE_OTHER): Payer: Self-pay | Admitting: *Deleted

## 2008-08-29 ENCOUNTER — Encounter (INDEPENDENT_AMBULATORY_CARE_PROVIDER_SITE_OTHER): Payer: Self-pay | Admitting: Internal Medicine

## 2008-08-30 ENCOUNTER — Encounter (INDEPENDENT_AMBULATORY_CARE_PROVIDER_SITE_OTHER): Payer: Self-pay | Admitting: Internal Medicine

## 2008-09-02 ENCOUNTER — Ambulatory Visit: Payer: Self-pay | Admitting: Family Medicine

## 2008-09-02 DIAGNOSIS — R040 Epistaxis: Secondary | ICD-10-CM | POA: Insufficient documentation

## 2008-09-02 DIAGNOSIS — R042 Hemoptysis: Secondary | ICD-10-CM | POA: Insufficient documentation

## 2008-09-04 LAB — CONVERTED CEMR LAB
Eosinophils Absolute: 0.1 10*3/uL (ref 0.0–0.7)
HCT: 42.8 % (ref 36.0–46.0)
INR: 1 (ref 0.8–1.0)
Monocytes Absolute: 0.5 10*3/uL (ref 0.1–1.0)
Monocytes Relative: 10.1 % (ref 3.0–12.0)
Neutrophils Relative %: 42.4 % — ABNORMAL LOW (ref 43.0–77.0)
Platelets: 174 10*3/uL (ref 150–400)
Prothrombin Time: 12.4 s (ref 10.9–13.3)
RDW: 12.7 % (ref 11.5–14.6)
aPTT: 28.8 s (ref 21.7–29.8)

## 2008-09-21 ENCOUNTER — Telehealth (INDEPENDENT_AMBULATORY_CARE_PROVIDER_SITE_OTHER): Payer: Self-pay | Admitting: *Deleted

## 2008-09-21 ENCOUNTER — Ambulatory Visit: Payer: Self-pay | Admitting: Family Medicine

## 2008-09-21 LAB — CONVERTED CEMR LAB
Bilirubin Urine: NEGATIVE
Glucose, Urine, Semiquant: NEGATIVE
Specific Gravity, Urine: 1.02
pH: 6.5

## 2008-09-25 ENCOUNTER — Ambulatory Visit: Payer: Self-pay | Admitting: Family Medicine

## 2008-09-25 LAB — CONVERTED CEMR LAB
Bacteria, UA: 0
Blood in Urine, dipstick: NEGATIVE
Ketones, urine, test strip: NEGATIVE
Protein, U semiquant: 30
RBC / HPF: 0

## 2008-10-31 ENCOUNTER — Ambulatory Visit: Payer: Self-pay | Admitting: Family Medicine

## 2008-10-31 DIAGNOSIS — R109 Unspecified abdominal pain: Secondary | ICD-10-CM | POA: Insufficient documentation

## 2008-11-05 ENCOUNTER — Encounter (INDEPENDENT_AMBULATORY_CARE_PROVIDER_SITE_OTHER): Payer: Self-pay | Admitting: *Deleted

## 2008-11-27 ENCOUNTER — Telehealth (INDEPENDENT_AMBULATORY_CARE_PROVIDER_SITE_OTHER): Payer: Self-pay | Admitting: Internal Medicine

## 2008-12-10 ENCOUNTER — Ambulatory Visit: Payer: Self-pay | Admitting: Family Medicine

## 2008-12-10 DIAGNOSIS — I2510 Atherosclerotic heart disease of native coronary artery without angina pectoris: Secondary | ICD-10-CM | POA: Insufficient documentation

## 2008-12-10 LAB — CONVERTED CEMR LAB
Glucose, Urine, Semiquant: NEGATIVE
Specific Gravity, Urine: 1.025
WBC Urine, dipstick: NEGATIVE
pH: 5

## 2008-12-11 ENCOUNTER — Encounter: Payer: Self-pay | Admitting: Family Medicine

## 2008-12-11 ENCOUNTER — Ambulatory Visit: Payer: Self-pay | Admitting: Cardiology

## 2008-12-11 LAB — CONVERTED CEMR LAB
ALT: 34 units/L (ref 0–35)
Basophils Absolute: 0 10*3/uL (ref 0.0–0.1)
Basophils Relative: 0.1 % (ref 0.0–3.0)
CO2: 32 meq/L (ref 19–32)
Calcium: 9.5 mg/dL (ref 8.4–10.5)
Creatinine, Ser: 1 mg/dL (ref 0.4–1.2)
Direct LDL: 158.3 mg/dL
Eosinophils Absolute: 0.1 10*3/uL (ref 0.0–0.7)
HCT: 42.4 % (ref 36.0–46.0)
Hemoglobin: 14.7 g/dL (ref 12.0–15.0)
Lymphocytes Relative: 44.9 % (ref 12.0–46.0)
MCHC: 34.6 g/dL (ref 30.0–36.0)
MCV: 100.1 fL — ABNORMAL HIGH (ref 78.0–100.0)
Neutro Abs: 2.1 10*3/uL (ref 1.4–7.7)
RBC: 4.24 M/uL (ref 3.87–5.11)
TSH: 1.69 microintl units/mL (ref 0.35–5.50)
Total Bilirubin: 0.8 mg/dL (ref 0.3–1.2)
Total Protein: 7.1 g/dL (ref 6.0–8.3)

## 2008-12-17 ENCOUNTER — Telehealth (INDEPENDENT_AMBULATORY_CARE_PROVIDER_SITE_OTHER): Payer: Self-pay | Admitting: Internal Medicine

## 2008-12-19 ENCOUNTER — Encounter (INDEPENDENT_AMBULATORY_CARE_PROVIDER_SITE_OTHER): Payer: Self-pay | Admitting: Internal Medicine

## 2008-12-25 ENCOUNTER — Ambulatory Visit: Payer: Self-pay | Admitting: Cardiology

## 2008-12-25 LAB — CONVERTED CEMR LAB
Calcium: 9.2 mg/dL (ref 8.4–10.5)
Chloride: 107 meq/L (ref 96–112)
Creatinine, Ser: 1.1 mg/dL (ref 0.4–1.2)
GFR calc Af Amer: 62 mL/min
GFR calc non Af Amer: 52 mL/min

## 2009-01-08 ENCOUNTER — Ambulatory Visit: Payer: Self-pay | Admitting: Cardiology

## 2009-01-08 LAB — CONVERTED CEMR LAB
AST: 21 units/L (ref 0–37)
Albumin: 3.9 g/dL (ref 3.5–5.2)
Alkaline Phosphatase: 92 units/L (ref 39–117)
BUN: 20 mg/dL (ref 6–23)
CO2: 32 meq/L (ref 19–32)
Chloride: 109 meq/L (ref 96–112)
Cholesterol: 190 mg/dL (ref 0–200)
Glucose, Bld: 115 mg/dL — ABNORMAL HIGH (ref 70–99)
Potassium: 5.2 meq/L — ABNORMAL HIGH (ref 3.5–5.1)
Total Protein: 6.8 g/dL (ref 6.0–8.3)

## 2009-02-04 ENCOUNTER — Ambulatory Visit: Payer: Self-pay | Admitting: Cardiology

## 2009-02-04 LAB — CONVERTED CEMR LAB
BUN: 14 mg/dL (ref 6–23)
Calcium: 9 mg/dL (ref 8.4–10.5)
Creatinine, Ser: 1.1 mg/dL (ref 0.4–1.2)
GFR calc non Af Amer: 51 mL/min

## 2009-02-24 DIAGNOSIS — K5730 Diverticulosis of large intestine without perforation or abscess without bleeding: Secondary | ICD-10-CM | POA: Insufficient documentation

## 2009-02-24 DIAGNOSIS — K644 Residual hemorrhoidal skin tags: Secondary | ICD-10-CM | POA: Insufficient documentation

## 2009-02-25 ENCOUNTER — Ambulatory Visit: Payer: Self-pay | Admitting: Gastroenterology

## 2009-02-25 DIAGNOSIS — C8589 Other specified types of non-Hodgkin lymphoma, extranodal and solid organ sites: Secondary | ICD-10-CM | POA: Insufficient documentation

## 2009-02-25 DIAGNOSIS — K529 Noninfective gastroenteritis and colitis, unspecified: Secondary | ICD-10-CM | POA: Insufficient documentation

## 2009-02-25 DIAGNOSIS — R197 Diarrhea, unspecified: Secondary | ICD-10-CM

## 2009-03-03 ENCOUNTER — Telehealth (INDEPENDENT_AMBULATORY_CARE_PROVIDER_SITE_OTHER): Payer: Self-pay | Admitting: *Deleted

## 2009-03-04 ENCOUNTER — Encounter: Payer: Self-pay | Admitting: Gastroenterology

## 2009-03-12 ENCOUNTER — Ambulatory Visit: Payer: Self-pay | Admitting: Gastroenterology

## 2009-03-12 ENCOUNTER — Encounter: Payer: Self-pay | Admitting: Gastroenterology

## 2009-03-13 ENCOUNTER — Telehealth: Payer: Self-pay | Admitting: Gastroenterology

## 2009-03-17 ENCOUNTER — Encounter: Payer: Self-pay | Admitting: Gastroenterology

## 2009-03-17 ENCOUNTER — Ambulatory Visit: Payer: Self-pay | Admitting: Family Medicine

## 2009-03-19 ENCOUNTER — Encounter: Admission: RE | Admit: 2009-03-19 | Discharge: 2009-03-19 | Payer: Self-pay | Admitting: Pulmonary Disease

## 2009-04-16 ENCOUNTER — Encounter: Admission: RE | Admit: 2009-04-16 | Discharge: 2009-04-16 | Payer: Self-pay | Admitting: Pulmonary Disease

## 2009-04-23 ENCOUNTER — Telehealth: Payer: Self-pay | Admitting: Cardiology

## 2009-04-23 ENCOUNTER — Ambulatory Visit: Payer: Self-pay | Admitting: Family Medicine

## 2009-04-23 DIAGNOSIS — S40029A Contusion of unspecified upper arm, initial encounter: Secondary | ICD-10-CM | POA: Insufficient documentation

## 2009-04-23 LAB — CONVERTED CEMR LAB
Glucose, Urine, Semiquant: NEGATIVE
Nitrite: NEGATIVE
Specific Gravity, Urine: 1.02
pH: 6

## 2009-04-24 ENCOUNTER — Encounter (INDEPENDENT_AMBULATORY_CARE_PROVIDER_SITE_OTHER): Payer: Self-pay | Admitting: Internal Medicine

## 2009-04-25 ENCOUNTER — Encounter (INDEPENDENT_AMBULATORY_CARE_PROVIDER_SITE_OTHER): Payer: Self-pay | Admitting: Internal Medicine

## 2009-04-29 ENCOUNTER — Encounter (INDEPENDENT_AMBULATORY_CARE_PROVIDER_SITE_OTHER): Payer: Self-pay | Admitting: Internal Medicine

## 2009-04-29 ENCOUNTER — Ambulatory Visit: Payer: Self-pay | Admitting: Cardiology

## 2009-04-29 DIAGNOSIS — M899 Disorder of bone, unspecified: Secondary | ICD-10-CM | POA: Insufficient documentation

## 2009-04-29 DIAGNOSIS — M949 Disorder of cartilage, unspecified: Secondary | ICD-10-CM

## 2009-04-29 DIAGNOSIS — R5381 Other malaise: Secondary | ICD-10-CM | POA: Insufficient documentation

## 2009-04-29 DIAGNOSIS — R5383 Other fatigue: Secondary | ICD-10-CM | POA: Insufficient documentation

## 2009-04-29 LAB — CONVERTED CEMR LAB
Basophils Absolute: 0 10*3/uL (ref 0.0–0.1)
Basophils Relative: 0.1 % (ref 0.0–3.0)
Eosinophils Absolute: 0.1 10*3/uL (ref 0.0–0.7)
Eosinophils Relative: 1.1 % (ref 0.0–5.0)
HCT: 41.8 % (ref 36.0–46.0)
Hemoglobin: 14.4 g/dL (ref 12.0–15.0)
Lymphocytes Relative: 49.5 % — ABNORMAL HIGH (ref 12.0–46.0)
Lymphs Abs: 2.7 10*3/uL (ref 0.7–4.0)
MCHC: 34.4 g/dL (ref 30.0–36.0)
MCV: 99.5 fL (ref 78.0–100.0)
Monocytes Absolute: 0.5 10*3/uL (ref 0.1–1.0)
Monocytes Relative: 8.9 % (ref 3.0–12.0)
Neutro Abs: 2.3 10*3/uL (ref 1.4–7.7)
Neutrophils Relative %: 40.4 % — ABNORMAL LOW (ref 43.0–77.0)
Platelets: 189 10*3/uL (ref 150.0–400.0)
RBC: 4.2 M/uL (ref 3.87–5.11)
RDW: 13 % (ref 11.5–14.6)
WBC: 5.6 10*3/uL (ref 4.5–10.5)

## 2009-05-05 ENCOUNTER — Ambulatory Visit: Payer: Self-pay | Admitting: Family Medicine

## 2009-05-06 ENCOUNTER — Encounter (INDEPENDENT_AMBULATORY_CARE_PROVIDER_SITE_OTHER): Payer: Self-pay | Admitting: Internal Medicine

## 2009-05-16 ENCOUNTER — Encounter (INDEPENDENT_AMBULATORY_CARE_PROVIDER_SITE_OTHER): Payer: Self-pay | Admitting: Internal Medicine

## 2009-05-26 ENCOUNTER — Ambulatory Visit: Payer: Self-pay | Admitting: Family Medicine

## 2009-05-27 ENCOUNTER — Encounter (INDEPENDENT_AMBULATORY_CARE_PROVIDER_SITE_OTHER): Payer: Self-pay | Admitting: Internal Medicine

## 2009-06-09 ENCOUNTER — Telehealth: Payer: Self-pay | Admitting: Cardiology

## 2009-06-27 ENCOUNTER — Telehealth: Payer: Self-pay | Admitting: Cardiology

## 2009-06-30 ENCOUNTER — Telehealth (INDEPENDENT_AMBULATORY_CARE_PROVIDER_SITE_OTHER): Payer: Self-pay | Admitting: Radiology

## 2009-06-30 ENCOUNTER — Ambulatory Visit: Payer: Self-pay | Admitting: Cardiology

## 2009-07-01 ENCOUNTER — Encounter: Payer: Self-pay | Admitting: Cardiovascular Disease

## 2009-07-01 ENCOUNTER — Ambulatory Visit: Payer: Self-pay

## 2009-07-21 ENCOUNTER — Ambulatory Visit: Payer: Self-pay | Admitting: Family Medicine

## 2009-07-28 ENCOUNTER — Telehealth: Payer: Self-pay | Admitting: Family Medicine

## 2009-07-29 ENCOUNTER — Ambulatory Visit: Payer: Self-pay | Admitting: Family Medicine

## 2009-07-29 DIAGNOSIS — R519 Headache, unspecified: Secondary | ICD-10-CM | POA: Insufficient documentation

## 2009-07-29 DIAGNOSIS — R51 Headache: Secondary | ICD-10-CM

## 2009-07-29 LAB — CONVERTED CEMR LAB
Basophils Relative: 0.8 % (ref 0.0–3.0)
Eosinophils Relative: 1.2 % (ref 0.0–5.0)
Hemoglobin: 14.2 g/dL (ref 12.0–15.0)
Lymphocytes Relative: 40.4 % (ref 12.0–46.0)
MCV: 100.8 fL — ABNORMAL HIGH (ref 78.0–100.0)
Neutro Abs: 2.2 10*3/uL (ref 1.4–7.7)
Neutrophils Relative %: 49.4 % (ref 43.0–77.0)
RBC: 4.12 M/uL (ref 3.87–5.11)
WBC: 4.6 10*3/uL (ref 4.5–10.5)

## 2009-07-31 ENCOUNTER — Encounter: Payer: Self-pay | Admitting: Family Medicine

## 2009-08-05 ENCOUNTER — Ambulatory Visit: Payer: Self-pay | Admitting: Cardiology

## 2009-08-07 ENCOUNTER — Encounter: Admission: RE | Admit: 2009-08-07 | Discharge: 2009-08-07 | Payer: Self-pay | Admitting: Cardiology

## 2009-08-14 ENCOUNTER — Telehealth: Payer: Self-pay | Admitting: Cardiology

## 2009-08-14 ENCOUNTER — Encounter: Payer: Self-pay | Admitting: Cardiology

## 2009-08-22 ENCOUNTER — Encounter (INDEPENDENT_AMBULATORY_CARE_PROVIDER_SITE_OTHER): Payer: Self-pay | Admitting: Internal Medicine

## 2009-08-27 ENCOUNTER — Encounter: Payer: Self-pay | Admitting: Family Medicine

## 2009-08-27 ENCOUNTER — Telehealth (INDEPENDENT_AMBULATORY_CARE_PROVIDER_SITE_OTHER): Payer: Self-pay | Admitting: Internal Medicine

## 2009-08-27 ENCOUNTER — Encounter (INDEPENDENT_AMBULATORY_CARE_PROVIDER_SITE_OTHER): Payer: Self-pay | Admitting: Internal Medicine

## 2009-09-05 ENCOUNTER — Ambulatory Visit: Payer: BC Managed Care – PPO | Admitting: Unknown Physician Specialty

## 2009-09-18 ENCOUNTER — Ambulatory Visit: Payer: Self-pay | Admitting: Family Medicine

## 2009-10-28 ENCOUNTER — Telehealth: Payer: Self-pay | Admitting: Cardiology

## 2009-10-29 ENCOUNTER — Ambulatory Visit: Payer: Self-pay | Admitting: Family Medicine

## 2009-10-29 DIAGNOSIS — R42 Dizziness and giddiness: Secondary | ICD-10-CM

## 2009-12-12 ENCOUNTER — Ambulatory Visit: Payer: Self-pay | Admitting: Family Medicine

## 2009-12-15 ENCOUNTER — Telehealth: Payer: Self-pay | Admitting: Family Medicine

## 2010-01-15 ENCOUNTER — Ambulatory Visit: Payer: Self-pay | Admitting: Family Medicine

## 2010-01-15 DIAGNOSIS — S335XXA Sprain of ligaments of lumbar spine, initial encounter: Secondary | ICD-10-CM

## 2010-02-10 ENCOUNTER — Ambulatory Visit: Payer: Self-pay | Admitting: Family Medicine

## 2010-02-10 DIAGNOSIS — L989 Disorder of the skin and subcutaneous tissue, unspecified: Secondary | ICD-10-CM | POA: Insufficient documentation

## 2010-04-15 ENCOUNTER — Ambulatory Visit: Payer: Self-pay | Admitting: Family Medicine

## 2010-05-01 ENCOUNTER — Telehealth: Payer: Self-pay | Admitting: Cardiology

## 2010-05-06 ENCOUNTER — Telehealth: Payer: Self-pay | Admitting: Cardiology

## 2010-05-07 ENCOUNTER — Encounter (INDEPENDENT_AMBULATORY_CARE_PROVIDER_SITE_OTHER): Payer: Self-pay | Admitting: *Deleted

## 2010-05-13 ENCOUNTER — Encounter: Payer: Self-pay | Admitting: Cardiology

## 2010-07-31 ENCOUNTER — Ambulatory Visit: Payer: Self-pay | Admitting: Cardiology

## 2010-08-04 ENCOUNTER — Ambulatory Visit: Payer: Self-pay | Admitting: Cardiology

## 2010-08-04 DIAGNOSIS — E782 Mixed hyperlipidemia: Secondary | ICD-10-CM | POA: Insufficient documentation

## 2010-08-04 DIAGNOSIS — I119 Hypertensive heart disease without heart failure: Secondary | ICD-10-CM

## 2010-08-05 LAB — CONVERTED CEMR LAB
Albumin: 4 g/dL (ref 3.5–5.2)
Basophils Relative: 0.3 % (ref 0.0–3.0)
CO2: 31 meq/L (ref 19–32)
Chloride: 103 meq/L (ref 96–112)
Cholesterol: 189 mg/dL (ref 0–200)
Eosinophils Absolute: 0 10*3/uL (ref 0.0–0.7)
HCT: 38.9 % (ref 36.0–46.0)
Hemoglobin: 13.5 g/dL (ref 12.0–15.0)
Lymphocytes Relative: 45.3 % (ref 12.0–46.0)
MCHC: 34.8 g/dL (ref 30.0–36.0)
MCV: 98.8 fL (ref 78.0–100.0)
Monocytes Absolute: 0.4 10*3/uL (ref 0.1–1.0)
Neutro Abs: 2 10*3/uL (ref 1.4–7.7)
Potassium: 5.4 meq/L — ABNORMAL HIGH (ref 3.5–5.1)
RBC: 3.94 M/uL (ref 3.87–5.11)
Sodium: 142 meq/L (ref 135–145)
Total CHOL/HDL Ratio: 5
Triglycerides: 394 mg/dL — ABNORMAL HIGH (ref 0.0–149.0)
VLDL: 78.8 mg/dL — ABNORMAL HIGH (ref 0.0–40.0)

## 2010-08-12 ENCOUNTER — Ambulatory Visit: Payer: Self-pay | Admitting: Cardiovascular Disease

## 2010-08-27 ENCOUNTER — Telehealth: Payer: Self-pay | Admitting: Cardiology

## 2010-08-27 LAB — CONVERTED CEMR LAB
BUN: 21 mg/dL (ref 6–23)
GFR calc non Af Amer: 51.25 mL/min (ref >60)
Glucose, Bld: 134 mg/dL — ABNORMAL HIGH (ref 70–99)
Potassium: 5.2 meq/L — ABNORMAL HIGH (ref 3.5–5.1)

## 2010-09-01 ENCOUNTER — Ambulatory Visit: Payer: Self-pay | Admitting: Family Medicine

## 2010-09-22 ENCOUNTER — Telehealth: Payer: Self-pay | Admitting: Cardiology

## 2010-09-23 ENCOUNTER — Ambulatory Visit: Payer: Self-pay | Admitting: Family Medicine

## 2010-10-26 LAB — CONVERTED CEMR LAB
CO2: 29 meq/L (ref 19–32)
Calcium: 9.1 mg/dL (ref 8.4–10.5)
Creatinine, Ser: 1.1 mg/dL (ref 0.4–1.2)
Glucose, Bld: 176 mg/dL — ABNORMAL HIGH (ref 70–99)

## 2010-12-29 NOTE — Assessment & Plan Note (Signed)
Summary: inner ear/dizzy/rbh   Vital Signs:  Patient profile:   77 year old female Height:      69.5 inches Weight:      175 pounds BMI:     25.56 Temp:     98.5 degrees F oral Pulse rate:   80 / minute Pulse rhythm:   regular BP sitting:   150 / 86  (left arm) Cuff size:   regular  Vitals Entered By: Delilah Shan CMA [name]) (December 12, 2009 9:37 AM) CC: Inner ear probs, dizzy   History of Present Illness: Here for acute vertigo.  Has had problems with it for years, but this is worst attack she has had. Woke up with it Saturday morning, cannot turn head to left side or she gets very dizzy and nauseated.  No tinnitus or hearing loss.  Episodes last a few seconds. No focal neurological deficits. Has Meclizine at home but it is written for her to take 25 mg every 8 hours.  It works for a few hours, then wears off.   Can't sleep because she is afraid she will turn her head in her sleep and wake up dizzy and nauseated.   Current Medications (verified): 1)  Toprol Xl 50 Mg Xr24h-Tab (Metoprolol Succinate) .... One Tab By Mouth Every Evening 2)  Zyrtec Allergy 10 Mg  Tabs (Cetirizine Hcl) .... Take 1 Tablet By Mouth Once At Bedtime 3)  Cozaar 50 Mg  Tabs (Losartan Potassium) .... Take 1 Tablet By Mouth Once A Day 4)  Cranberry Plus Vitamin C 140-100-3 Mg-Mg-Unit Caps (Cranberry-Vitamin C-Vitamin E) .Marland Kitchen.. 1 Capsule By Mouth Once Daily 5)  Ticlopidine Hcl 250 Mg  Tabs (Ticlopidine Hcl) .... Take One By Mouth Bid 6)  Aspirin Adult Low Strength 81 Mg Tbec (Aspirin) .Marland Kitchen.. 1 Tablet By Mouth Once Daily 7)  Claritin 10 Mg Tabs (Loratadine) .... Take One Tablet Once Qam 8)  Toprol Xl 100 Mg Xr24h-Tab (Metoprolol Succinate) .Marland Kitchen.. 1 Tablet By Mouth Every Morning 9)  Zetia 10 Mg Tabs (Ezetimibe) .Marland Kitchen.. 1 Tablet By Mouth Once Daily 10)  Vision Formula  Tabs (Multiple Vitamins-Minerals) .Marland Kitchen.. 1 Tablet By Mouth Once Daily 11)  Ibuprofen 200 Mg Tabs (Ibuprofen) .Marland Kitchen.. 1 Tablet By Mouth As Needed 12)   Prevacid 15 Mg Cpdr (Lansoprazole) .Marland Kitchen.. 1 Tab Once Daily 13)  Antivert 25 Mg Tabs (Meclizine Hcl) .... Take 1 Every 8 Hrs As Needed For Dizziness 14)  Calcium Carbonate-Vitamin D 600-400 Mg-Unit  Tabs (Calcium Carbonate-Vitamin D) .... Take 1 Tablet By Mouth Once A Day 15)  Lorazepam 0.5 Mg Tabs (Lorazepam) .Marland Kitchen.. 1 Tab By Mouth At Bedtime As Needed For Anxiety, Dizziness.  Allergies: 1)  ! Macrobid 2)  ! Celebrex 3)  ! * Tussionex 4)  ! Cipro 5)  ! Darvocet 6)  ! * Vytorin 7)  ! * Plavix 8)  ! * Lyrica 9)  ! Vioxx 10)  ! Keflex 11)  ! * Nuts 12)  * Iodine; Iodine Containing Group 13)  * Sulfa (Sulfonamides) Group 14)  Codeine Phosphate (Codeine Phosphate) 15)  * Cipro (Ciprofloxacin) 16)  * Vytorin 17)  Sulfamethoxazole (Sulfamethoxazole)  Review of Systems      See HPI General:  Denies malaise. Neuro:  Complains of sensation of room spinning; denies falling down, headaches, tingling, tremors, and visual disturbances.  Physical Exam  General:  alert, well-developed, well-nourished, and well-hydrated.  NAD Eyes:  No corneal or conjunctival inflammation noted. EOMI. Perrla. Funduscopic exam benign, without hemorrhages, exudates or  papilledema. Vision grossly normal. Neurologic:  alert & oriented X3, strength normal in all extremities,  gait normal.   Psych:  normally interactive and good eye contact.     Impression & Recommendations:  Problem # 1:  VERTIGO (ICD-780.4) Assessment Deteriorated consistent with benign positional vertigo.  She did not want me to perform Gilberto Better today because she drove herself and does not want to get sick.  Will increase dosage of Antivert to 50 mg two times a day (max is 100 mg daily) and add Lorazepam at night.  See patient instructions for details. Her updated medication list for this problem includes:    Zyrtec Allergy 10 Mg Tabs (Cetirizine hcl) .Marland Kitchen... Take 1 tablet by mouth once at bedtime    Claritin 10 Mg Tabs (Loratadine) .Marland Kitchen... Take  one tablet once qam    Antivert 25 Mg Tabs (Meclizine hcl) .Marland Kitchen... Take 1 every 8 hrs as needed for dizziness  Complete Medication List: 1)  Toprol Xl 50 Mg Xr24h-tab (Metoprolol succinate) .... One tab by mouth every evening 2)  Zyrtec Allergy 10 Mg Tabs (Cetirizine hcl) .... Take 1 tablet by mouth once at bedtime 3)  Cozaar 50 Mg Tabs (Losartan potassium) .... Take 1 tablet by mouth once a day 4)  Cranberry Plus Vitamin C 140-100-3 Mg-mg-unit Caps (Cranberry-vitamin c-vitamin e) .Marland Kitchen.. 1 capsule by mouth once daily 5)  Ticlopidine Hcl 250 Mg Tabs (Ticlopidine hcl) .... Take one by mouth bid 6)  Aspirin Adult Low Strength 81 Mg Tbec (Aspirin) .Marland Kitchen.. 1 tablet by mouth once daily 7)  Claritin 10 Mg Tabs (Loratadine) .... Take one tablet once qam 8)  Toprol Xl 100 Mg Xr24h-tab (Metoprolol succinate) .Marland Kitchen.. 1 tablet by mouth every morning 9)  Zetia 10 Mg Tabs (Ezetimibe) .Marland Kitchen.. 1 tablet by mouth once daily 10)  Vision Formula Tabs (Multiple vitamins-minerals) .Marland Kitchen.. 1 tablet by mouth once daily 11)  Ibuprofen 200 Mg Tabs (Ibuprofen) .Marland Kitchen.. 1 tablet by mouth as needed 12)  Prevacid 15 Mg Cpdr (Lansoprazole) .Marland Kitchen.. 1 tab once daily 13)  Antivert 25 Mg Tabs (Meclizine hcl) .... Take 1 every 8 hrs as needed for dizziness 14)  Calcium Carbonate-vitamin D 600-400 Mg-unit Tabs (Calcium carbonate-vitamin d) .... Take 1 tablet by mouth once a day 15)  Lorazepam 0.5 Mg Tabs (Lorazepam) .Marland Kitchen.. 1 tab by mouth at bedtime as needed for anxiety, dizziness.  Patient Instructions: 1)  Meclizine- 2 tablets twice a day. 2)  Lorazepam- 0.5 mg every night. 3)  Call me if no better on Monday. Prescriptions: LORAZEPAM 0.5 MG TABS (LORAZEPAM) 1 tab by mouth at bedtime as needed for anxiety, dizziness.  #60 x 0   Entered and Authorized by:   Ruthe Mannan MD   Signed by:   Ruthe Mannan MD on 12/12/2009   Method used:   Print then Give to Patient   RxID:   [id number]   Current Allergies (reviewed today): ! MACROBID !  CELEBREX ! * TUSSIONEX ! CIPRO ! DARVOCET ! * VYTORIN ! * PLAVIX ! * LYRICA ! VIOXX ! KEFLEX ! * NUTS * IODINE; IODINE CONTAINING GROUP * SULFA (SULFONAMIDES) GROUP CODEINE PHOSPHATE (CODEINE PHOSPHATE) * CIPRO (CIPROFLOXACIN) * VYTORIN SULFAMETHOXAZOLE (SULFAMETHOXAZOLE)

## 2010-12-29 NOTE — Assessment & Plan Note (Signed)
Summary: [name] [name]   Visit Type:  Follow-up Primary Provider:  Everrett Coombe, MD(  -retired) Dr Clifton Custard  CC:  no coplaints.  History of Present Illness: This 77 years old and return for management of CAD. In 2009 she had tandem nonoverlapping promus drug-eluting stents placed to the LAD. She was evaluated in 2010 for chest pain and had a negative Myoview scan. She has been allergic to Plavix and is currently taking Ticlid.  She says she's been doing quite well has had no recent chest pain shortness of breath or palpitations.  her other problems include hyperlipidemia and hypertension. She's been intolerant to statins. She also has a history of non-Hodgkin's lymphoma.  Current Medications (verified): 1)  Metoprolol Succinate 100 Mg Xr24h-Tab (Metoprolol Succinate) .... Pt Takes 100mg  Tab in The Am and Then 50mg  Tab in The Pm 2)  Zyrtec Allergy 10 Mg  Tabs (Cetirizine Hcl) .... Take 1 Tablet By Mouth Once At Bedtime 3)  Cozaar 50 Mg  Tabs (Losartan Potassium) .... Take 1 Tablet By Mouth Once A Day 4)  Cranberry Plus Vitamin C 140-100-3 Mg-Mg-Unit Caps (Cranberry-Vitamin C-Vitamin E) .Marland Kitchen.. 1 Capsule By Mouth Once Daily 5)  Ticlopidine Hcl 250 Mg  Tabs (Ticlopidine Hcl) .Marland Kitchen.. 1 Tab Bid 6)  Aspirin Adult Low Strength 81 Mg Tbec (Aspirin) .Marland Kitchen.. 1 Tablet By Mouth Once Daily 7)  Claritin 10 Mg Tabs (Loratadine) .... Take One Tablet Once Qam 8)  Zetia 10 Mg Tabs (Ezetimibe) .Marland Kitchen.. 1 Tablet By Mouth Once Daily 9)  Vision Formula  Tabs (Multiple Vitamins-Minerals) .Marland Kitchen.. 1 Tablet By Mouth Once Daily 10)  Ibuprofen 200 Mg Tabs (Ibuprofen) .Marland Kitchen.. 1 Tablet By Mouth As Needed 11)  Prevacid 15 Mg Cpdr (Lansoprazole) .Marland Kitchen.. 1 Tab Once Daily 12)  Antivert 25 Mg Tabs (Meclizine Hcl) .... Take 1 Every 8 Hrs As Needed For Dizziness 13)  Lorazepam 0.5 Mg Tabs (Lorazepam) .Marland Kitchen.. 1 Tab By Mouth At Bedtime As Needed For Anxiety, Dizziness. 14)  Tramadol Hcl 50 Mg  Tabs (Tramadol Hcl) .... 1/2 Tablet To 1 Tablet Every 6 Hours As Needed  For Pain. 15)  Trimethoprim 100 Mg Tabs (Trimethoprim) .Marland Kitchen.. 1 Tab Two Times A Day As Needed  Allergies (verified): 1)  ! Macrobid 2)  ! Celebrex 3)  ! * Tussionex 4)  ! Cipro 5)  ! Darvocet 6)  ! * Vytorin 7)  ! * Plavix 8)  ! * Lyrica 9)  ! Vioxx 10)  ! Keflex 11)  ! * Nuts 12)  * Iodine; Iodine Containing Group 13)  * Sulfa (Sulfonamides) Group 14)  Codeine Phosphate (Codeine Phosphate) 15)  * Cipro (Ciprofloxacin) 16)  * Vytorin 17)  Sulfamethoxazole (Sulfamethoxazole)  Past History:  Past Medical History: Reviewed history from 08/04/2009 and no changes required.  GERD multi level degenerative spondylosis and braad based disc bulge at L 4-5--2/09--[facility]  1. Coronary artery disease status post percutaneous coronary     intervention of the mid and distal left anterior descending with     drug-eluting stents 2009 neg MV 06/2009 2. Urticaria secondary most probably to either Cipro or Plavix,     currently on Ticlid. 3. Hyperlipidemia with intolerance to STATINS. 4. Hypertension. 5. Non-Hodgkin's lymphoma. 6. History of multiple allergies including PLAVIX, CIPRO and STATINS.   Review of Systems       ROS is negative except as outlined in HPI.   Vital Signs:  Patient profile:   77 year old female Height:      69 inches  Weight:      177 pounds BMI:     26.23 Pulse rate:   68 / minute BP sitting:   117 / 78  (left arm) Cuff size:   regular  Vitals Entered By: Burnett Kanaris, CNA (July 31, 2010 3:53 PM)  Physical Exam  Additional Exam:  Gen. Well-nourished, in no distress   Neck: No JVD, thyroid not enlarged, no carotid bruits Lungs: No tachypnea, clear without rales, rhonchi or wheezes Cardiovascular: Rhythm regular, PMI not displaced,  heart sounds  normal, no murmurs or gallops, no peripheral edema, pulses normal in all 4 extremities. Abdomen: BS normal, abdomen soft and non-tender without masses or organomegaly, no hepatosplenomegaly. MS: No  deformities, no cyanosis or clubbing   Neuro:  No focal sns   Skin:  no lesions    Impression & Recommendations:  Problem # 1:  CAD, NATIVE VESSEL (ICD-414.01) She has 2 drug-eluting stents in the LAD. These are nonoverlapping second-generation stents. In the side effect profile of Ticlid and the fact that she has second-generation doubling stent, I think we should stop the Ticlid this point. We'll continue aspirin.  The following medications were removed from the medication list:    Ticlopidine Hcl 250 Mg Tabs (Ticlopidine hcl) .Marland Kitchen... 1 tab bid    Toprol Xl 100 Mg Xr24h-tab (Metoprolol succinate) .Marland Kitchen... 1 tablet by mouth every morning Her updated medication list for this problem includes:    Metoprolol Succinate 100 Mg Xr24h-tab (Metoprolol succinate) .Marland Kitchen... Pt takes 100mg  tab in the am and then 50mg  tab in the pm    Aspirin Adult Low Strength 81 Mg Tbec (Aspirin) .Marland Kitchen... 1 tablet by mouth once daily  Orders: EKG w/ Interpretation (93000)  Problem # 2:  LABILE HYPERTENSION (ICD-401.9) This is well controlled on current medications. The following medications were removed from the medication list:    Toprol Xl 100 Mg Xr24h-tab (Metoprolol succinate) .Marland Kitchen... 1 tablet by mouth every morning Her updated medication list for this problem includes:    Metoprolol Succinate 100 Mg Xr24h-tab (Metoprolol succinate) .Marland Kitchen... Pt takes 100mg  tab in the am and then 50mg  tab in the pm    Cozaar 50 Mg Tabs (Losartan potassium) .Marland Kitchen... Take 1 tablet by mouth once a day    Aspirin Adult Low Strength 81 Mg Tbec (Aspirin) .Marland Kitchen... 1 tablet by mouth once daily  Problem # 3:  Hx of HYPERCHOLESTEROLEMIA (ICD-272.0) She is intolerant to statins and is just being treated with ZETIA. We will lipid profile. Her updated medication list for this problem includes:    Zetia 10 Mg Tabs (Ezetimibe) .Marland Kitchen... 1 tablet by mouth once daily  Patient Instructions: 1)  Finish your current dose of Ticlid and then stop this medication. 2)   Your physician recommends that you return for FASTING  lab work in: 1 week- lipid/liver/bmet/cbc (414.01;272.2;402.10) 3)  Your physician wants you to follow-up in: 1 year with [name]. You will receive a reminder letter in the mail two months in advance. If you don't receive a letter, please call our office to schedule the follow-up appointment.

## 2010-12-29 NOTE — Progress Notes (Signed)
Summary: pt request call  Phone Note Call from Patient Call back at [facility] Phone [phone]   Caller: Patient Reason for Call: Talk to Nurse Initial call taken by: Judie Grieve,  September 22, 2010 9:23 AM  Follow-up for Phone Call        Pt. concerned regarding her Cozaar & wondering if she still needs to take 1/2 pill or 25mg . I advised her to continue taking the 25mg  & I have set her up to go have another BMP drawn @ [facility] on 10/26 to recheck her potassium level since we decreased her Cozaar. Pt. aware. Whitney Maeola Sarah RN  September 22, 2010 9:46 AM  Follow-up by: Whitney Maeola Sarah RN,  September 22, 2010 9:47 AM

## 2010-12-29 NOTE — Progress Notes (Signed)
Summary: Having eye surgery need to stop medication  Phone Note Call from Patient Call back at Home Phone [phone]   Caller: Patient Summary of Call: Pt having eye surgery and need to stop Aspirin and Ticlopidine  need to be off for 10 days Initial call taken by: Judie Grieve,  May 01, 2010 3:36 PM  Follow-up for Phone Call        Called patient back..she states that she has a blocked tear duct and needs to have surgery in [facility] ( Not scheduled yet). They are requesting that she come off of Asa and Ticlid 7 to 10 days prior. Advised we will ask Dr.Jenniferann Stuckert next week and call her back. Follow-up by: Suzan Garibaldi RN

## 2010-12-29 NOTE — Progress Notes (Signed)
Summary: Hold meds prior to procedure  ---- Converted from flag ---- ---- 05/05/2010 9:00 PM, Lenoria Farrier, MD, [facility] wrote: Let's continue asa. BB  ---- 05/05/2010 5:40 PM, Sherri Rad, RN, BSN wrote: [place] Sidle Dr. Juanda Chance- this pt is on ticlid- you told me it would be ok to hold for her procedure. How about her ASA?  ---- 05/03/2010 9:31 PM, Lenoria Farrier, MD, [facility] wrote: Herbert Seta, OK to hold plavix 5-7 days prior to surgery. BB ------------------------------  Phone Note Outgoing Call   Call placed by: Sherri Rad, RN, BSN,  May 06, 2010 4:30 PM Call placed to: Patient Summary of Call: The pt is aware she may hold Ticlid for 5-7 days prior to her procedure, but that Dr. Juanda Chance would like her to continue her ASA. The pt asked that we contact Dr. Mickie Kay in [facility]. [phone] FAX/ [phone]. I will send this imformation to them. Initial call taken by: Sherri Rad, RN, BSN,  May 06, 2010 4:32 PM

## 2010-12-29 NOTE — Assessment & Plan Note (Signed)
Summary: PAIN R  BACK/CLE   Vital Signs:  Patient profile:   77 year old female Height:      69.5 inches Weight:      173.50 pounds BMI:     25.35 Temp:     98.4 degrees F oral Pulse rate:   68 / minute Pulse rhythm:   regular BP sitting:   146 / 80  (left arm) Cuff size:   regular  Vitals Entered By: Delilah Shan CMA [name]) (February 10, 2010 8:35 AM) CC: Pain in right back   History of Present Illness: 78 yo with 3 days of aching in her right back/side. Feels like skin is tender to touch, no radiation of pain. No known injury.  Not aggrevated by food or changes in position. Does feel like she cannot wear a bra because any touch hurts. Feels a little tingly as well. No rashes evident.  Does have a h/o CAD but does not feel this pain is at all related, feels like it is on the surface of her skin. No CP, SOB, DOE. No LE edema.  Has not had Zostavax.  Current Medications (verified): 1)  Metoprolol Succinate 100 Mg Xr24h-Tab (Metoprolol Succinate) .... Take One Tablet By Mouth Daily 2)  Zyrtec Allergy 10 Mg  Tabs (Cetirizine Hcl) .... Take 1 Tablet By Mouth Once At Bedtime 3)  Cozaar 50 Mg  Tabs (Losartan Potassium) .... Take 1 Tablet By Mouth Once A Day 4)  Cranberry Plus Vitamin C 140-100-3 Mg-Mg-Unit Caps (Cranberry-Vitamin C-Vitamin E) .Marland Kitchen.. 1 Capsule By Mouth Once Daily 5)  Ticlopidine Hcl 250 Mg  Tabs (Ticlopidine Hcl) .Marland Kitchen.. 1 Tab Bid 6)  Aspirin Adult Low Strength 81 Mg Tbec (Aspirin) .Marland Kitchen.. 1 Tablet By Mouth Once Daily 7)  Claritin 10 Mg Tabs (Loratadine) .... Take One Tablet Once Qam 8)  Toprol Xl 100 Mg Xr24h-Tab (Metoprolol Succinate) .Marland Kitchen.. 1 Tablet By Mouth Every Morning 9)  Zetia 10 Mg Tabs (Ezetimibe) .Marland Kitchen.. 1 Tablet By Mouth Once Daily 10)  Vision Formula  Tabs (Multiple Vitamins-Minerals) .Marland Kitchen.. 1 Tablet By Mouth Once Daily 11)  Ibuprofen 200 Mg Tabs (Ibuprofen) .Marland Kitchen.. 1 Tablet By Mouth As Needed 12)  Prevacid 15 Mg Cpdr (Lansoprazole) .Marland Kitchen.. 1 Tab Once Daily 13)  Antivert  25 Mg Tabs (Meclizine Hcl) .... Take 1 Every 8 Hrs As Needed For Dizziness 14)  Calcium Carbonate-Vitamin D 600-400 Mg-Unit  Tabs (Calcium Carbonate-Vitamin D) .... Take 1 Tablet By Mouth Once A Day 15)  Lorazepam 0.5 Mg Tabs (Lorazepam) .Marland Kitchen.. 1 Tab By Mouth At Bedtime As Needed For Anxiety, Dizziness. 16)  Cyclobenzaprine Hcl 10 Mg  Tabs (Cyclobenzaprine Hcl) .Marland Kitchen.. 1 By Mouth Nightly As Needed For Back Pain 17)  Meloxicam 15 Mg Tabs (Meloxicam) .... One By Mouth Daily As Needed Back Pain. 18)  Valtrex 1 Gm Tabs (Valacyclovir Hcl) .Marland Kitchen.. 1 Tab By Mouth Three Times A Day X 7 Days 19)  Tramadol Hcl 50 Mg  Tabs (Tramadol Hcl) .... 1/2 Tablet To 1 Tablet Every 6 Hours As Needed For Pain.  Allergies: 1)  ! Macrobid 2)  ! Celebrex 3)  ! * Tussionex 4)  ! Cipro 5)  ! Darvocet 6)  ! * Vytorin 7)  ! * Plavix 8)  ! * Lyrica 9)  ! Vioxx 10)  ! Keflex 11)  ! * Nuts 12)  * Iodine; Iodine Containing Group 13)  * Sulfa (Sulfonamides) Group 14)  Codeine Phosphate (Codeine Phosphate) 15)  * Cipro (Ciprofloxacin) 16)  *  Vytorin 17)  Sulfamethoxazole (Sulfamethoxazole)  Review of Systems      See HPI General:  Denies chills and fever. CV:  Denies chest pain or discomfort. Resp:  Denies shortness of breath. Derm:  Denies rash.  Physical Exam  General:  alert, well-developed, well-nourished, and well-hydrated.  NAD Lungs:  Normal respiratory effort, chest expands symmetrically. Lungs are clear to auscultation, no crackles or wheezes. Heart:  Normal rate and regular rhythm. S1 and S2 normal without gallop, murmur, click, rub or other extra sounds. Msk:  normal ROM.   Neg empty can, neg arch.  Skin:  No rash on right side. Very tender to touch. No pain with deep palpation. Psych:  normally interactive and good eye contact.     Impression & Recommendations:  Problem # 1:  UNSPECIFIED DISORDER OF SKIN&SUBCUTANEOUS TISSUE (ICD-709.9) Assessment New  Pain does seem very consistent with Zoster,  no rash evident yet. Will treat with Valtrex 1 g by mouth three times a day x 7 days. If pain changes, needs to f/u with me immediately. I would like to see her back next week. I have added her to the Zostavax wait list.  Orders: Prescription Created Electronically [phone])  Complete Medication List: 1)  Metoprolol Succinate 100 Mg Xr24h-tab (Metoprolol succinate) .... Take one tablet by mouth daily 2)  Zyrtec Allergy 10 Mg Tabs (Cetirizine hcl) .... Take 1 tablet by mouth once at bedtime 3)  Cozaar 50 Mg Tabs (Losartan potassium) .... Take 1 tablet by mouth once a day 4)  Cranberry Plus Vitamin C 140-100-3 Mg-mg-unit Caps (Cranberry-vitamin c-vitamin e) .Marland Kitchen.. 1 capsule by mouth once daily 5)  Ticlopidine Hcl 250 Mg Tabs (Ticlopidine hcl) .Marland Kitchen.. 1 tab bid 6)  Aspirin Adult Low Strength 81 Mg Tbec (Aspirin) .Marland Kitchen.. 1 tablet by mouth once daily 7)  Claritin 10 Mg Tabs (Loratadine) .... Take one tablet once qam 8)  Toprol Xl 100 Mg Xr24h-tab (Metoprolol succinate) .Marland Kitchen.. 1 tablet by mouth every morning 9)  Zetia 10 Mg Tabs (Ezetimibe) .Marland Kitchen.. 1 tablet by mouth once daily 10)  Vision Formula Tabs (Multiple vitamins-minerals) .Marland Kitchen.. 1 tablet by mouth once daily 11)  Ibuprofen 200 Mg Tabs (Ibuprofen) .Marland Kitchen.. 1 tablet by mouth as needed 12)  Prevacid 15 Mg Cpdr (Lansoprazole) .Marland Kitchen.. 1 tab once daily 13)  Antivert 25 Mg Tabs (Meclizine hcl) .... Take 1 every 8 hrs as needed for dizziness 14)  Calcium Carbonate-vitamin D 600-400 Mg-unit Tabs (Calcium carbonate-vitamin d) .... Take 1 tablet by mouth once a day 15)  Lorazepam 0.5 Mg Tabs (Lorazepam) .Marland Kitchen.. 1 tab by mouth at bedtime as needed for anxiety, dizziness. 16)  Cyclobenzaprine Hcl 10 Mg Tabs (Cyclobenzaprine hcl) .Marland Kitchen.. 1 by mouth nightly as needed for back pain 17)  Meloxicam 15 Mg Tabs (Meloxicam) .... One by mouth daily as needed back pain. 18)  Valtrex 1 Gm Tabs (Valacyclovir hcl) .Marland Kitchen.. 1 tab by mouth three times a day x 7 days 19)  Tramadol Hcl 50 Mg Tabs  (Tramadol hcl) .... 1/2 tablet to 1 tablet every 6 hours as needed for pain. Prescriptions: TRAMADOL HCL 50 MG  TABS (TRAMADOL HCL) 1/2 tablet to 1 tablet every 6 hours as needed for pain.  #30 x 0   Entered and Authorized by:   Ruthe Mannan MD   Signed by:   Ruthe Mannan MD on 02/10/2010   Method used:   Electronically to        [facility]  [address]       [place], [place]  [address]       Ph: [phone]       Fax: [phone]   RxID:   [id number] VALTREX 1 GM TABS (VALACYCLOVIR HCL) 1 tab by mouth three times a day x 7 days  #21 x 0   Entered and Authorized by:   Ruthe Mannan MD   Signed by:   Ruthe Mannan MD on 02/10/2010   Method used:   Electronically to        [facility]       [address]       [place], [place]  [address]       Ph: [phone]       Fax: [phone]   RxID:   [id number]   Current Allergies (reviewed today): ! MACROBID ! CELEBREX ! * TUSSIONEX ! CIPRO ! DARVOCET ! * VYTORIN ! * PLAVIX ! * LYRICA ! VIOXX ! KEFLEX ! * NUTS * IODINE; IODINE CONTAINING GROUP * SULFA (SULFONAMIDES) GROUP CODEINE PHOSPHATE (CODEINE PHOSPHATE) * CIPRO (CIPROFLOXACIN) * VYTORIN SULFAMETHOXAZOLE (SULFAMETHOXAZOLE)

## 2010-12-29 NOTE — Letter (Signed)
Summary: [facility] Note  [facility] Note   Imported By: Roderic Ovens 05/29/2010 09:46:29  _____________________________________________________________________  External Attachment:    Type:   Image     Comment:   External Document

## 2010-12-29 NOTE — Assessment & Plan Note (Signed)
Summary: BACK PAIN  [name]   Vital Signs:  Patient profile:   77 year old female Height:      69.5 inches Weight:      177.25 pounds BMI:     25.89 Temp:     98.5 degrees F oral Pulse rate:   88 / minute Pulse rhythm:   regular BP sitting:   142 / 84  (left arm) Cuff size:   regular  Vitals Entered By: Delilah Shan CMA [name]) (January 15, 2010 2:43 PM) CC: Back pain   History of Present Illness: 77 yo with left back pain.  Started 4 weeks ago, moved a certain way while lifting something, hurting ever since.  Left side only. No radiulcopathy, urinary incontinence, dysuria, fevers/chills, saddle anesthesia or LE weakness. Has been gradually getting better but she wanted to make sure that it was not something serious. Taking Ibuprofen as needed which does provide some relief. Pain is worse when she goes from lying to standing position.  Current Medications (verified): 1)  Toprol Xl 50 Mg Xr24h-Tab (Metoprolol Succinate) .... One Tab By Mouth Every Evening 2)  Zyrtec Allergy 10 Mg  Tabs (Cetirizine Hcl) .... Take 1 Tablet By Mouth Once At Bedtime 3)  Cozaar 50 Mg  Tabs (Losartan Potassium) .... Take 1 Tablet By Mouth Once A Day 4)  Cranberry Plus Vitamin C 140-100-3 Mg-Mg-Unit Caps (Cranberry-Vitamin C-Vitamin E) .Marland Kitchen.. 1 Capsule By Mouth Once Daily 5)  Ticlopidine Hcl 250 Mg  Tabs (Ticlopidine Hcl) .Marland Kitchen.. 1 Tab Bid 6)  Aspirin Adult Low Strength 81 Mg Tbec (Aspirin) .Marland Kitchen.. 1 Tablet By Mouth Once Daily 7)  Claritin 10 Mg Tabs (Loratadine) .... Take One Tablet Once Qam 8)  Toprol Xl 100 Mg Xr24h-Tab (Metoprolol Succinate) .Marland Kitchen.. 1 Tablet By Mouth Every Morning 9)  Zetia 10 Mg Tabs (Ezetimibe) .Marland Kitchen.. 1 Tablet By Mouth Once Daily 10)  Vision Formula  Tabs (Multiple Vitamins-Minerals) .Marland Kitchen.. 1 Tablet By Mouth Once Daily 11)  Ibuprofen 200 Mg Tabs (Ibuprofen) .Marland Kitchen.. 1 Tablet By Mouth As Needed 12)  Prevacid 15 Mg Cpdr (Lansoprazole) .Marland Kitchen.. 1 Tab Once Daily 13)  Antivert 25 Mg Tabs (Meclizine Hcl) ....  Take 1 Every 8 Hrs As Needed For Dizziness 14)  Calcium Carbonate-Vitamin D 600-400 Mg-Unit  Tabs (Calcium Carbonate-Vitamin D) .... Take 1 Tablet By Mouth Once A Day 15)  Lorazepam 0.5 Mg Tabs (Lorazepam) .Marland Kitchen.. 1 Tab By Mouth At Bedtime As Needed For Anxiety, Dizziness. 16)  Cyclobenzaprine Hcl 10 Mg  Tabs (Cyclobenzaprine Hcl) .Marland Kitchen.. 1 By Mouth Nightly As Needed For Back Pain 17)  Meloxicam 15 Mg Tabs (Meloxicam) .... One By Mouth Daily As Needed Back Pain.  Allergies: 1)  ! Macrobid 2)  ! Celebrex 3)  ! * Tussionex 4)  ! Cipro 5)  ! Darvocet 6)  ! * Vytorin 7)  ! * Plavix 8)  ! * Lyrica 9)  ! Vioxx 10)  ! Keflex 11)  ! * Nuts 12)  * Iodine; Iodine Containing Group 13)  * Sulfa (Sulfonamides) Group 14)  Codeine Phosphate (Codeine Phosphate) 15)  * Cipro (Ciprofloxacin) 16)  * Vytorin 17)  Sulfamethoxazole (Sulfamethoxazole)  Review of Systems      See HPI General:  Denies chills, fatigue, and fever. GU:  Denies dysuria, hematuria, incontinence, urinary frequency, and urinary hesitancy. MS:  Complains of low back pain; denies loss of strength, muscle aches, muscle weakness, stiffness, and thoracic pain.  Physical Exam  General:  alert, well-developed, well-nourished, and well-hydrated.  NAD   Detailed Back/Spine Exam  Lumbosacral Exam:  Inspection-deformity:    Normal Palpation-spinal tenderness:  Normal Squatting:      Pain with hyperextension only. Lying Straight Leg Raise:    Right:  negative Sitting Straight Leg Raise:    Right:  negative    Left:  negative Reverse Straight Leg Raise:    Right:  negative    Left:  negative Contralateral Straight Leg Raise:    Right:  negative    Left:  negative Fabere Test:    Right:  negative    Left:  negative   Impression & Recommendations:  Problem # 1:  LUMBAR SPRAIN AND STRAIN (ICD-847.2) Assessment New  No red flag signs or symptoms. Continue supportive care, will try muscle relaxant and NSAIDs. See pt  instructions for details.  Orders: Prescription Created Electronically [phone])  Complete Medication List: 1)  Toprol Xl 50 Mg Xr24h-tab (Metoprolol succinate) .... One tab by mouth every evening 2)  Zyrtec Allergy 10 Mg Tabs (Cetirizine hcl) .... Take 1 tablet by mouth once at bedtime 3)  Cozaar 50 Mg Tabs (Losartan potassium) .... Take 1 tablet by mouth once a day 4)  Cranberry Plus Vitamin C 140-100-3 Mg-mg-unit Caps (Cranberry-vitamin c-vitamin e) .Marland Kitchen.. 1 capsule by mouth once daily 5)  Ticlopidine Hcl 250 Mg Tabs (Ticlopidine hcl) .Marland Kitchen.. 1 tab bid 6)  Aspirin Adult Low Strength 81 Mg Tbec (Aspirin) .Marland Kitchen.. 1 tablet by mouth once daily 7)  Claritin 10 Mg Tabs (Loratadine) .... Take one tablet once qam 8)  Toprol Xl 100 Mg Xr24h-tab (Metoprolol succinate) .Marland Kitchen.. 1 tablet by mouth every morning 9)  Zetia 10 Mg Tabs (Ezetimibe) .Marland Kitchen.. 1 tablet by mouth once daily 10)  Vision Formula Tabs (Multiple vitamins-minerals) .Marland Kitchen.. 1 tablet by mouth once daily 11)  Ibuprofen 200 Mg Tabs (Ibuprofen) .Marland Kitchen.. 1 tablet by mouth as needed 12)  Prevacid 15 Mg Cpdr (Lansoprazole) .Marland Kitchen.. 1 tab once daily 13)  Antivert 25 Mg Tabs (Meclizine hcl) .... Take 1 every 8 hrs as needed for dizziness 14)  Calcium Carbonate-vitamin D 600-400 Mg-unit Tabs (Calcium carbonate-vitamin d) .... Take 1 tablet by mouth once a day 15)  Lorazepam 0.5 Mg Tabs (Lorazepam) .Marland Kitchen.. 1 tab by mouth at bedtime as needed for anxiety, dizziness. 16)  Cyclobenzaprine Hcl 10 Mg Tabs (Cyclobenzaprine hcl) .Marland Kitchen.. 1 by mouth nightly as needed for back pain 17)  Meloxicam 15 Mg Tabs (Meloxicam) .... One by mouth daily as needed back pain.  Patient Instructions: 1)  Most patients (90%) with low back pain will improve with time ( 2-6 weeks). Keep active but avoid activities that are painful. Apply moist heat and/or ice to lower back several times a day.  2)  Try Flexeril as needed at night, along with Mobic during the day. Prescriptions: MELOXICAM 15 MG TABS  (MELOXICAM) one by mouth daily as needed back pain.  #30 x 0   Entered and Authorized by:   Ruthe Mannan MD   Signed by:   Ruthe Mannan MD on 01/15/2010   Method used:   Electronically to        [facility]       [address]       [place], [place]  [address]       Ph: [phone]       Fax: [phone]   RxID:   [id number] CYCLOBENZAPRINE HCL 10 MG  TABS (CYCLOBENZAPRINE HCL) 1 by mouth  nightly as needed for back pain  #20 x 0   Entered and Authorized by:   Ruthe Mannan MD   Signed by:   Ruthe Mannan MD on 01/15/2010   Method used:   [facility]        [facility]       [address]       [place], [place]  [address]       Ph: [phone]       Fax: [phone]   RxID:   [id number]   Current Allergies (reviewed today): ! MACROBID ! CELEBREX ! * TUSSIONEX ! CIPRO ! DARVOCET ! * VYTORIN ! * PLAVIX ! * LYRICA ! VIOXX ! KEFLEX ! * NUTS * IODINE; IODINE CONTAINING GROUP * SULFA (SULFONAMIDES) GROUP CODEINE PHOSPHATE (CODEINE PHOSPHATE) * CIPRO (CIPROFLOXACIN) * VYTORIN SULFAMETHOXAZOLE (SULFAMETHOXAZOLE)

## 2010-12-29 NOTE — Assessment & Plan Note (Signed)
Summary: SHINGLES SHOT/DLO  Nurse Visit   Allergies: 1)  ! Macrobid 2)  ! Celebrex 3)  ! * Tussionex 4)  ! Cipro 5)  ! Darvocet 6)  ! * Vytorin 7)  ! * Plavix 8)  ! * Lyrica 9)  ! Vioxx 10)  ! Keflex 11)  ! * Nuts 12)  * Iodine; Iodine Containing Group 13)  * Sulfa (Sulfonamides) Group 14)  Codeine Phosphate (Codeine Phosphate) 15)  * Cipro (Ciprofloxacin) 16)  * Vytorin 17)  Sulfamethoxazole (Sulfamethoxazole)  Immunizations Administered:  Zostavax # 1:    Vaccine Type: Zostavax    Site: left deltoid    Mfr: Merck    Dose: 0.65ML    Route: [place]    Given by: Linde Gillis CMA (AAMA)    Exp. Date: 05/08/2011    Lot #: [id number]    VIS given: 09/10/05 given Apr 15, 2010.   Patient is aware that Medicare will not cover the cost of this injection.  She says that BCBS will cover it at 100%.  Linde Gillis CMA [name])  Apr 15, 2010 9:10 AM    Orders Added: 1)  Zoster (Shingles) Vaccine Live [90736] 2)  Admin 1st Vaccine [phone]

## 2010-12-29 NOTE — Progress Notes (Signed)
Summary: Dizzy  Phone Note Call from Patient Call back at [phone] or [phone] cell   Caller: Patient Call For: Dr. Dayton Martes Summary of Call: Pt was supposed to call today for Dr. Dayton Martes with update on how feeling with inner ear problem. Pt thinks Lorazepam0.5mg  made her feed druggy and hung over the next morning. Pt took on Fri and Sat nights and on Sat and Sun mornings felt hung over. Pt did not take Lorazepam on Sunday night and did not have the hung over feeling this morning. Pt would like another med instead of Lorazepam. Also today had not been dizzy. Pt took two  Meclizine this morning and had a good day with no dizziness. About 4 pm pt bent over to get something out of cabinet and became dizzy. Pt took 2 Meclizine and is not dizzy now and can walk without staggering. Dr. Dayton Martes has gone for the day and I will leave phone note for her but will check with Dr. Milinda Antis to verify ok for pt to wait until tomorrown for instruction from Dr. Dayton Martes. Pt said she would rest tonight and not bend over. Please advise.  Initial call taken by: Lewanda Rife LPN,  December 15, 2009 4:41 PM  Follow-up for Phone Call        meclizine as needed hold lorazepam  udate asap if symptoms worsen or change  update Dr Dayton Martes in am Follow-up by: Judith Part MD,  December 15, 2009 4:43 PM  Additional Follow-up for Phone Call Additional follow up Details #1::        Patient notified as instructed by telephone. Lewanda Rife LPN  December 15, 2009 5:23 PM     Additional Follow-up for Phone Call Additional follow up Details #2::    Ok to stop the Lorazepam.  I think seeing an ENT at this point would be best if she is ok with that.  Take the meclizine as needed.  I can put the referral in for ENT if she's ready. Follow-up by: Ruthe Mannan MD,  December 16, 2009 7:54 AM   Appended Document: Dizzy Left message on voicemail  to return call.   Appended Document: Dizzy Patient states that she doesn't think she could tolerate them  "jerking her head around" like she has heard some people say that they do.  She said she thought that you said there was another medication that she could take along with this that might help.     Also, could she possibly take 1/2 of the Lorazepam to see if that might help to relieve the hang over feeling in the mornings.  Appended Document: Dizzy We could try antinausea drugs, which sometimes help. I would definitely recommend cutting the lorazepam dosage in half, which she can do on her own. If Meclizine truly is not helping, I would go see an ENT as she may need further work up.  They wont do any procedures without her consent.  Appended Document: Dizzy Patient says she will try the 1/2 Lorazepam for a few nights to see if that helps and if not, she will schedule an appt. with her ENT, Dr. Jenne Campus, who has done sinus surgery on her in the past.

## 2010-12-29 NOTE — Progress Notes (Signed)
Summary: pt would like lab results  Phone Note Call from Patient Call back at Home Phone [phone]   Caller: Patient Reason for Call: Talk to Nurse, Talk to Doctor, Lab or Test Results Summary of Call: pt would like lab results Initial call taken by: Omer Jack,  August 27, 2010 4:14 PM  Follow-up for Phone Call        PT AWARE DR BRODIE HASN'T REVIEWED LABS WILL CALL ONCE HE REVIEWS PT VERBALIZED UNDERSTANDING. Follow-up by: Scherrie Bateman, LPN,  August 27, 2010 4:30 PM

## 2010-12-29 NOTE — Assessment & Plan Note (Signed)
Summary: FLU SHOT/CLE  Nurse Visit   Allergies: 1)  ! Macrobid 2)  ! Celebrex 3)  ! * Tussionex 4)  ! Cipro 5)  ! Darvocet 6)  ! * Vytorin 7)  ! * Plavix 8)  ! * Lyrica 9)  ! Vioxx 10)  ! Keflex 11)  ! * Nuts 12)  * Iodine; Iodine Containing Group 13)  * Sulfa (Sulfonamides) Group 14)  Codeine Phosphate (Codeine Phosphate) 15)  * Cipro (Ciprofloxacin) 16)  * Vytorin 17)  Sulfamethoxazole (Sulfamethoxazole)  Orders Added: 1)  Flu Vaccine 13yrs + MEDICARE PATIENTS [Q2039] 2)  Administration Flu vaccine - MCR [G0008]  Flu Vaccine Consent Questions     Do you have a history of severe allergic reactions to this vaccine? no    Any prior history of allergic reactions to egg and/or gelatin? no    Do you have a sensitivity to the preservative Thimersol? no    Do you have a past history of Guillan-Barre Syndrome? no    Do you currently have an acute febrile illness? no    Have you ever had a severe reaction to latex? no    Vaccine information given and explained to patient? yes    Are you currently pregnant? no    Lot Number:[id number]   Exp Date:05/29/2011   Site Given  Left Deltoid IMu

## 2010-12-29 NOTE — Letter (Signed)
Summary: Generic Letter  [facility], Main Office  [address]   [place], [place] [address]   Phone: [phone]  Fax: [phone]        May 07, 2010 MRN: [id number]    [facility] [address] [place], [place]  [address]    Dear. Dr. Ether Griffins,  Ms. Lennartz has contacted our office stating that she has some upcoming eye surgery with you. She may hold her Ticlid for 5-7 days prior to surgery, but it is suggested that she continue her Aspirin throughout the perioperative period. If you have any questions please contact our office.     Sincerely,   Charlies Constable, MD Sherri Rad, RN, BSN  This letter has been electronically signed by your physician.

## 2011-02-01 ENCOUNTER — Ambulatory Visit (INDEPENDENT_AMBULATORY_CARE_PROVIDER_SITE_OTHER)
Admission: RE | Admit: 2011-02-01 | Discharge: 2011-02-01 | Disposition: A | Discharge status: Home / Self Care | Payer: Medicare Other | Source: Ambulatory Visit | Attending: Family Medicine | Admitting: Family Medicine

## 2011-02-01 ENCOUNTER — Encounter: Payer: Self-pay | Admitting: Family Medicine

## 2011-02-01 ENCOUNTER — Ambulatory Visit (INDEPENDENT_AMBULATORY_CARE_PROVIDER_SITE_OTHER): Payer: Medicare Other | Admitting: Family Medicine

## 2011-02-01 ENCOUNTER — Other Ambulatory Visit: Payer: Self-pay | Admitting: Family Medicine

## 2011-02-01 DIAGNOSIS — M549 Dorsalgia, unspecified: Secondary | ICD-10-CM

## 2011-02-01 DIAGNOSIS — E782 Mixed hyperlipidemia: Secondary | ICD-10-CM

## 2011-02-01 DIAGNOSIS — E785 Hyperlipidemia, unspecified: Secondary | ICD-10-CM

## 2011-02-01 LAB — HEPATIC FUNCTION PANEL
AST: 57 U/L — ABNORMAL HIGH (ref 0–37)
Albumin: 4.1 g/dL (ref 3.5–5.2)
Alkaline Phosphatase: 76 U/L (ref 39–117)
Total Bilirubin: 0.7 mg/dL (ref 0.3–1.2)

## 2011-02-01 LAB — BASIC METABOLIC PANEL
BUN: 16 mg/dL (ref 6–23)
Calcium: 9.1 mg/dL (ref 8.4–10.5)
GFR: 51.19 mL/min — ABNORMAL LOW (ref >60.00)
Glucose, Bld: 187 mg/dL — ABNORMAL HIGH (ref 70–99)
Potassium: 4.9 mEq/L (ref 3.5–5.1)
Sodium: 137 mEq/L (ref 135–145)

## 2011-02-01 LAB — LIPID PANEL
HDL: 40.8 mg/dL (ref >39.00)
Triglycerides: 202 mg/dL — ABNORMAL HIGH (ref 0.0–149.0)
VLDL: 40.4 mg/dL — ABNORMAL HIGH (ref 0.0–40.0)

## 2011-02-01 LAB — LDL CHOLESTEROL, DIRECT: Direct LDL: 110.7 mg/dL

## 2011-02-09 NOTE — Assessment & Plan Note (Signed)
Summary: RIGHT SIDE PAIN,PAIN BETWEEN SHOULDERS/CLE  MEDICARE/BCBS   Vital Signs:  Patient profile:   77 year old female Height:      69 inches Weight:      177.50 pounds BMI:     26.31 Temp:     97.9 degrees F oral Pulse rate:   64 / minute Pulse rhythm:   regular BP sitting:   140 / 72  (left arm) Cuff size:   regular  Vitals Entered By: Linde Gillis CMA [name]) (February 01, 2011 11:49 AM) CC: right side pain, pain between shoulder blades   History of Present Illness: 77 yo here for  right arm neck, back pain that started acutely several days ago.  Has been caring for her sister who fell but reports not lifting anything heavy or out of the ordinary for her.  No tingling down her right arm, no UE weakness.  Hurts most when she turns from side to side or lays directly on the tender area.  Last DEXA was in 04/2009- osteopenia.  Current Medications (verified): 1)  Metoprolol Succinate 100 Mg Xr24h-Tab (Metoprolol Succinate) .... Pt Takes 100mg  Tab in The Am and Then 50mg  Tab in The Pm 2)  Zyrtec Allergy 10 Mg  Tabs (Cetirizine Hcl) .... Take 1 Tablet By Mouth Once At Bedtime 3)  Cozaar 50 Mg  Tabs (Losartan Potassium) .... Take 1 Tablet By Mouth Once A Day 4)  Cranberry Plus Vitamin C 140-100-3 Mg-Mg-Unit Caps (Cranberry-Vitamin C-Vitamin E) .Marland Kitchen.. 1 Capsule By Mouth Once Daily 5)  Aspirin Adult Low Strength 81 Mg Tbec (Aspirin) .Marland Kitchen.. 1 Tablet By Mouth Once Daily 6)  Claritin 10 Mg Tabs (Loratadine) .... Take One Tablet Once Qam 7)  Zetia 10 Mg Tabs (Ezetimibe) .Marland Kitchen.. 1 Tablet By Mouth Once Daily 8)  Vision Formula  Tabs (Multiple Vitamins-Minerals) .Marland Kitchen.. 1 Tablet By Mouth Once Daily 9)  Ibuprofen 200 Mg Tabs (Ibuprofen) .Marland Kitchen.. 1 Tablet By Mouth As Needed 10)  Prevacid 15 Mg Cpdr (Lansoprazole) .Marland Kitchen.. 1 Tab Once Daily 11)  Antivert 25 Mg Tabs (Meclizine Hcl) .... Take 1 Every 8 Hrs As Needed For Dizziness 12)  Trimethoprim 100 Mg Tabs (Trimethoprim) .Marland Kitchen.. 1 Tab Two Times A Day As Needed 13)   Toprol Xl 50 Mg Xr24h-Tab (Metoprolol Succinate) .... Take One Tablet in The Evening.  Pt Also Takes A 100 Mg Tablet in The Morning 14)  Bonine 25 Mg Chew (Meclizine Hcl) .... Take As Directed  Allergies: 1)  ! Macrobid 2)  ! Celebrex 3)  ! * Tussionex 4)  ! Cipro 5)  ! Darvocet 6)  ! * Vytorin 7)  ! * Plavix 8)  ! * Lyrica 9)  ! Vioxx 10)  ! Keflex 11)  ! * Nuts 12)  * Iodine; Iodine Containing Group 13)  * Sulfa (Sulfonamides) Group 14)  Codeine Phosphate (Codeine Phosphate) 15)  * Cipro (Ciprofloxacin) 16)  * Vytorin 17)  Sulfamethoxazole (Sulfamethoxazole)  Past History:  Past Medical History: Last updated: 08/04/2009  GERD multi level degenerative spondylosis and braad based disc bulge at L 4-5--2/09--[facility]  1. Coronary artery disease status post percutaneous coronary     intervention of the mid and distal left anterior descending with     drug-eluting stents 2009 neg MV 06/2009 2. Urticaria secondary most probably to either Cipro or Plavix,     currently on Ticlid. 3. Hyperlipidemia with intolerance to STATINS. 4. Hypertension. 5. Non-Hodgkin's lymphoma. 6. History of multiple allergies including PLAVIX, CIPRO and STATINS.  Past Surgical History: Last updated: 2009/03/24 Dexa- neg (12/1995) Cardiac cath- neg (01/1997)    CAD (10/2004) Laser surgery, left- cataracts (11/1998) Hysterectomy- partial, fibroids--1ovary left in Stress cardiolite- poor tolerance, neg ischemia, EF 80% (03/1999) Abd U/S- normal (08/10/1999) Breast U/S- cyst (01/2000), repeated 06/2000--asperated Colonoscopy- normal (05/2000) Colonoscopy- divertics, colitis- non infectious (08/2001) Sigmoidoscopy- colitis, atypical proctitis (11/2001) Left knee surgery (10/2003) 2D Echo- thickening mitral valve (10/2003) Cardiolite- neg EF 72% (04/2002) Dexa- neg (12/1995),  neg (06/2001) EGD- Neg (04/2000) Colonoscopy- normal (09/2004),  divertics (09/2005) CT spine--2/09--[facility]--multilevel  degenerative spondylosis and bulging disc L4-5 Cardiac Stent Placement x 2  Family History: Last updated: 2009-03-24 Father: deceased age 58- HTN Mother: deceased age 15- cancer Jaw Siblings: sister died of cerebral hemorrhage age 42 No FH of Colon Cancer:  Social History: Last updated: 03-24-09 Marital Status: Married Children:  Occupation: retired Patient has never smoked.  Alcohol Use - no Illicit Drug Use - no  Risk Factors: Smoking Status: never (24-Mar-2009)  Review of Systems      See HPI General:  Denies fever and malaise. MS:  Complains of thoracic pain; denies joint redness, joint swelling, loss of strength, and muscle weakness.  Physical Exam  General:  alert, well-developed, well-nourished, and well-hydrated.  NAD   Detailed Back/Spine Exam  Cervical Exam:  Inspection-deformity:    Normal Palpation-spinal tenderness:  Normal Range of Motion:    Forward Flexion:   60 degrees    Hyperextension:   75 degrees    Right Lat. Flexion:   45 degrees    Left Lat. Flexion:   45 degrees    Right Lat. Rotation:   80 degrees    Left Lat. Rotation:   80 degrees Spurling Maneuver:    negative Hoffman's Sign:    Right:  negative    Left:  negative  Thoracic Exam:  Inspection-deformity:    Normal Palpation-spinal tenderness:  Abnormal    TTP over T3  Shoulder Prominence    Location: normal Pelvis Prominence    Location: none Range of Motion:    Forward Flexion:   60 degrees    Hyperextension:   75 degrees    Right Lat. Flexion:   45 degrees    Left Lat. Flexion:   45 degrees    Right Lat. Rotation:   80 degrees    Left Lat. Rotation:   80 degrees   Impression & Recommendations:  Problem # 1:  BACK PAIN, ACUTE (ICD-724.5) Assessment New Will get xray of Thoracic and cervical spines and I am concerned about her point tenderness. ? compression fracture vs strain. The following medications were removed from the medication list:    Tramadol Hcl 50 Mg  Tabs (Tramadol hcl) .Marland Kitchen... 1/2 tablet to 1 tablet every 6 hours as needed for pain. Her updated medication list for this problem includes:    Aspirin Adult Low Strength 81 Mg Tbec (Aspirin) .Marland Kitchen... 1 tablet by mouth once daily    Ibuprofen 200 Mg Tabs (Ibuprofen) .Marland Kitchen... 1 tablet by mouth as needed  Orders: T-Thoracic Spine 2 Views [phone]) T-Cervicle Spine 2-3 Views (72040TC)  Problem # 2:  MIXED HYPERLIPIDEMIA (ICD-272.2) Assessment: Unchanged recheck labs today. Her updated medication list for this problem includes:    Zetia 10 Mg Tabs (Ezetimibe) .Marland Kitchen... 1 tablet by mouth once daily  Orders: Venipuncture (86578) TLB-Lipid Panel (80061-LIPID) TLB-BMP (Basic Metabolic Panel-BMET) (80048-METABOL) TLB-Hepatic/Liver Function Pnl (80076-HEPATIC)  Complete Medication List: 1)  Metoprolol Succinate 100 Mg Xr24h-tab (Metoprolol succinate) .... Pt takes 100mg   tab in the am and then 50mg  tab in the pm 2)  Zyrtec Allergy 10 Mg Tabs (Cetirizine hcl) .... Take 1 tablet by mouth once at bedtime 3)  Cozaar 50 Mg Tabs (Losartan potassium) .... Take 1 tablet by mouth once a day 4)  Cranberry Plus Vitamin C 140-100-3 Mg-mg-unit Caps (Cranberry-vitamin c-vitamin e) .Marland Kitchen.. 1 capsule by mouth once daily 5)  Aspirin Adult Low Strength 81 Mg Tbec (Aspirin) .Marland Kitchen.. 1 tablet by mouth once daily 6)  Claritin 10 Mg Tabs (Loratadine) .... Take one tablet once qam 7)  Zetia 10 Mg Tabs (Ezetimibe) .Marland Kitchen.. 1 tablet by mouth once daily 8)  Vision Formula Tabs (Multiple vitamins-minerals) .Marland Kitchen.. 1 tablet by mouth once daily 9)  Ibuprofen 200 Mg Tabs (Ibuprofen) .Marland Kitchen.. 1 tablet by mouth as needed 10)  Prevacid 15 Mg Cpdr (Lansoprazole) .Marland Kitchen.. 1 tab once daily 11)  Antivert 25 Mg Tabs (Meclizine hcl) .... Take 1 every 8 hrs as needed for dizziness 12)  Trimethoprim 100 Mg Tabs (Trimethoprim) .Marland Kitchen.. 1 tab two times a day as needed 13)  Toprol Xl 50 Mg Xr24h-tab (Metoprolol succinate) .... Take one tablet in the evening.  pt also takes a  100 mg tablet in the morning 14)  Bonine 25 Mg Chew (Meclizine hcl) .... Take as directed   Orders Added: 1)  T-Thoracic Spine 2 Views [72070TC] 2)  T-Cervicle Spine 2-3 Views [72040TC] 3)  Venipuncture [36415] 4)  TLB-Lipid Panel [80061-LIPID] 5)  TLB-BMP (Basic Metabolic Panel-BMET) [80048-METABOL] 6)  TLB-Hepatic/Liver Function Pnl [80076-HEPATIC] 7)  Est. Patient Level IV [04540]    Current Allergies (reviewed today): ! MACROBID ! CELEBREX ! * TUSSIONEX ! CIPRO ! DARVOCET ! * VYTORIN ! * PLAVIX ! * LYRICA ! VIOXX ! KEFLEX ! * NUTS * IODINE; IODINE CONTAINING GROUP * SULFA (SULFONAMIDES) GROUP CODEINE PHOSPHATE (CODEINE PHOSPHATE) * CIPRO (CIPROFLOXACIN) * VYTORIN SULFAMETHOXAZOLE (SULFAMETHOXAZOLE)

## 2011-03-04 ENCOUNTER — Telehealth: Payer: Self-pay | Admitting: *Deleted

## 2011-03-04 NOTE — Telephone Encounter (Signed)
Let's refer to ortho.  If ok with her, I will place referral on Monday.

## 2011-03-04 NOTE — Telephone Encounter (Signed)
Patient called to let Dr. Dayton Martes know that her back pains has gotten worse.  She stated that she cannot even open the oven door or cabinet doors without wanting to cry.  She had xrays done recently.  She has been taking Tylenol and Ibuprofen for pain with no relief.  Please advise.

## 2011-03-09 ENCOUNTER — Other Ambulatory Visit: Payer: Self-pay | Admitting: Family Medicine

## 2011-03-09 DIAGNOSIS — S139XXA Sprain of joints and ligaments of unspecified parts of neck, initial encounter: Secondary | ICD-10-CM

## 2011-03-09 NOTE — Telephone Encounter (Signed)
Referral

## 2011-03-09 NOTE — Telephone Encounter (Signed)
Referral placed.

## 2011-03-09 NOTE — Telephone Encounter (Signed)
Patient would like referral to ortho.

## 2011-03-15 ENCOUNTER — Other Ambulatory Visit: Payer: Self-pay | Admitting: Orthopedic Surgery

## 2011-03-15 DIAGNOSIS — M549 Dorsalgia, unspecified: Secondary | ICD-10-CM

## 2011-03-19 ENCOUNTER — Ambulatory Visit
Admission: RE | Admit: 2011-03-19 | Discharge: 2011-03-19 | Disposition: A | Discharge status: Home / Self Care | Payer: Medicare Other | Source: Ambulatory Visit | Attending: Orthopedic Surgery | Admitting: Orthopedic Surgery

## 2011-03-19 DIAGNOSIS — M549 Dorsalgia, unspecified: Secondary | ICD-10-CM

## 2011-03-25 ENCOUNTER — Telehealth: Payer: Self-pay | Admitting: *Deleted

## 2011-03-25 MED ORDER — FLUCONAZOLE 150 MG PO TABS: 150.0000 mg | ORAL_TABLET | Freq: Once | ORAL | Status: AC

## 2011-03-25 NOTE — Telephone Encounter (Signed)
Pt states she has a yeast infection- has itching and burning.  She has been using otc vagicaine, but I told her that wont cure anything, but might make her feel better, she said it has not.  She is asking that something be called to [facility].

## 2011-03-25 NOTE — Telephone Encounter (Signed)
Patient advised as instructed via telephone. 

## 2011-03-25 NOTE — Telephone Encounter (Signed)
Diflucan sent in.  If no improvement, needs to be seen before another rx is called in.

## 2011-04-13 NOTE — Assessment & Plan Note (Signed)
[facility]                            CARDIOLOGY OFFICE NOTE   NAME:[name] [name], [name] [name]                        MRN:          [id number]  DATE:02/29/2008                            DOB:          05-17-1932    PRIMARY CARE PHYSICIAN:  Atha Starks. Bean, FNP, at [facility]   CLINICAL HISTORY:  Ms. Boonstra is 77 years old and returns for management  of coronary heart disease.  She came in for an unscheduled visit because  of increasing chest pain.  She had undergone catheterization in 2005 and  was found to have 70-80% stenosis in the distal LAD and was managed  medically.  Recently she has had some increased symptoms of chest pain  over the past couple of months.  She describes the pain as beginning in  the back between her shoulder blades and then radiating to the front of  her chest.  It seems to be particularly related to exertion.  The back  part of the pain seems to be precipitated also by bending, tilting head  backwards.  Her pain is a little bit different than the pain she had in  2005 when she was admitted with unstable angina.  She has had no  palpitations or diaphoresis.   PAST MEDICAL HISTORY:  1. A non-Hodgkin lymphoma, which is followed with CT scans.  2. Hyperlipidemia.  3. Hypertension.   She has been intolerant to Vytorin and Zetia.   CURRENT MEDICATIONS:  1. Aspirin 81 mg.  2. AcipHex.  3. Zyrtec.  4. Cozaar 50 mg daily.  5. Pravastatin 10 mg daily.  6. Toprol 75 mg daily, which she takes metoprolol 50 mg the morning      and 25 mg in the afternoon.  7. PreserVision daily.   EXAMINATION:  Blood pressure is 142/82 and the pulse 59 and regular.  There was no venous distention.  The carotid pulses were full without  bruits.  CHEST:  Clear.  The heart rhythm was regular.  No murmurs or gallops.  ABDOMEN:  Soft, without organomegaly.  Peripheral pulses were full and there was no peripheral edema.   IMPRESSION:  Ms. Marciel  symptoms are suggestive of recurrent angina.  In view of her known distal left anterior descending artery disease, I  think she can be evaluated better with cardiac catheterization than with  a nuclear stress test.  We will set her up for this for next week.  If  her catheterization does not suggest a new source of ischemia, then we  may consider cervical  spine disease as a possible culprit for her symptoms as some of them are  aggravated by position of her neck.  I will make a decision after we  have the results of her catheterization.     Bruce Elvera Lennox Juanda Chance, MD, [facility]  Electronically Signed    BRB/MedQ  DD: 02/29/2008  DT: 02/29/2008  Job #: [id number]

## 2011-04-13 NOTE — Assessment & Plan Note (Signed)
[facility]                            CARDIOLOGY OFFICE NOTE   NAME:[name] [name], [name] [name]                        MRN:          [id number]  DATE:12/18/2007                            DOB:          12-11-1931    PRIMARY CARE PHYSICIAN:  Atha Starks. Bean, FNP in [facility]   CLINICAL HISTORY:  [name] [name] is 77 years old and returns for management  of coronary heart disease.  She was admitted with unstable angina and  underwent catheterization in December 2005, at which time she was found  to have 70% and 80% distal stenoses in the LAD.  She has been managed  medically since that time.  She said she has been doing quite well from  the standpoint of her heart and has had no chest pain, shortness of  breath or palpitations.   PAST MEDICAL HISTORY:  Significant for non-Hodgkin's lymphoma for which  she gets serial CT scans.  She has been intolerant to both Vytorin and  Zetia.  She also has a history of multiple allergies and is quite  allergenic.  She had a severe reaction to [facility]  Her past history  is also significant for hypertension.   CURRENT MEDICATIONS:  1. Aspirin.  2. Aciphex.  3. Imdur.  4. B-complex.  5. Toprol.  6. Zyrtec.  7. Cozaar.   PHYSICAL EXAMINATION:  Blood pressure:  122/75.  Pulse:  71, regular.  There was no venous distension.  Carotids are full without bruits.  Chest was clear.  Heart rhythm was regular.  No murmurs or gallops.  Abdomen was soft with no organomegaly.  Peripheral pulses were full with  no peripheral edema.   Electrocardiogram showed small inferior Q waves and was otherwise  normal.   IMPRESSION:  1. Coronary artery disease with coronary anatomy as described above.  2. Hypertension.  3. Hyperlipidemia.  Intolerance to Vytorin and Zetia.  4. Non-Hodgkin's lymphoma.  5. History of multiple allergies including PLAVIX, CIPRO, and      SHELLFISH.   RECOMMENDATIONS:  1. I think [name] [name] is doing well from  the standpoint of her heart.      I think she can stop the Imdur since she is not having any angina.      Unfortunately she cannot take a statin.  I told her we would get a      fasting lipid profile.  After we get the lipid profile, we will      decide if we want to consider pravastatin or more likely give her      some alternative medicine such as garlic.  She has had reactions to      so many drugs that I am a little reluctant to start her on      another.  2. I will plan to see her back on a p.r.n. basis after this.     Bruce Elvera Lennox Juanda Chance, MD, [facility]  Electronically Signed    BRB/MedQ  DD: 12/18/2007  DT: 12/18/2007  Job #: [id number]

## 2011-04-13 NOTE — Assessment & Plan Note (Signed)
[facility]                                 ON-CALL NOTE   NAME:[name] [name], ONEDIA                          MRN:          [id number]  DATE:03/11/2008                            DOB:          Sep 06, 1932    This is a patient of Dr. Juanda Chance.  I received a call from Ms. Fatica this  evening stating that she recently underwent stent placement this past  Friday, April 10, and has been on Plavix for about a week now.  While  hospitalized between Friday and yesterday (Sunday), she was diagnosed  with a urinary tract infection and also initiated on Cipro therapy.  Today she noted some hives for which she took Benadryl with reasonable  control but then lay down to take a nap, and when she woke up, she noted  that her bottom lip was swollen.  She has not had any dyspnea.  She is  asking what she should do.  I have recommended that she present to a  local emergency room, which in her case is [facility], for  evaluation.  It may be that she is allergic to Cipro which she has been  on for just a couple of days.  However, the possibility of Plavix  allergy is also present as that is something that may not show up until  about 7-10 days after initiation of therapy.  I recommended stopping  Cipro for now and going to [facility] for initiation of steroids, and that  we would be in touch for followup.  The patient was grateful for the  callback.     Nicolasa Ducking, ANP  Electronically Signed    CB/MedQ  DD: 03/11/2008  DT: 03/11/2008  Job #: [id number]

## 2011-04-13 NOTE — Assessment & Plan Note (Signed)
[facility]                            CARDIOLOGY OFFICE NOTE   NAME:[name], [name] BRODMAN                        MRN:          [id number]  DATE:07/30/2008                            DOB:          23-Apr-1932    PRIMARY CARE PHYSICIAN:  Atha Starks. Bean, FNP, family nurse practitioner  at [facility].   CLINICAL HISTORY:  [name] [name] is a 77 years old and returns for followup  management of coronary heart disease.  In April 2009, she underwent  implantation of tandem non-overlapping drug-eluting stents in the mid  and distal LAD for exertional angina.  She initially did well, but she  developed a skin rash when she was treated with Cipro and we were not  sure if this is related to Plavix or Cipro and stopped both, then she  had been on Ticlid since that time.  She is continued to have trouble  with hives and whelps intermittently.  She also says that she feels a  warm sensation about 30 minutes after she takes Ticlid.   In addition to these symptoms, she says she has exertional chest  tightness which is worse when she walks up a hill.  She says that this  is similar what she had before stents, although she said it never went  away after we put the stents in.   Her past medical history is significant for non-Hodgkin lymphoma which  has been followed by CT scan.  She has hyperlipidemia, but has been  intolerant of statins due to muscle aches.  She also has hypertension.   CURRENT MEDICATIONS:  1. Ticlid 250 mg b.i.d.  2. Zyrtec.  3. Cozaar 50 mg daily.  4. Aspirin 325 mg daily.  5. Prilosec 20 mg daily.  6. Metoprolol ER 125 mg daily.   PHYSICAL EXAMINATION:  VITAL SIGNS:  The blood pressure is 147/84 and  the pulse is 73 and regular.  NECK:  There is no vein distension.  The carotid pulses are full without  bruits.  CHEST:  Clear.  HEART:  Rhythm was regular with no murmurs or gallops.  ABDOMEN:  Soft with normal bowel sounds.  There is  hepatosplenomegaly.  EXTREMITIES:  There is no peripheral edema.  Pedal pulses are equal.  SKIN:  Did show some, what appeared to be angioedema, but with linear  markings suggestive of possible contact dermatitis.   IMPRESSION:  1. Coronary artery disease, status post placement of tandem non-      overlapping drug-eluting stents in the mid and distal LAD April      2009.  2. Urticaria secondary to Cipro or Plavix.  Currently on Ticlid.  3. Recurrent urticaria.  4. Nasal congestion/probable sinusitis, possibly allergic.  5. Hypertension.  6. History of non-Hodgkin lymphoma.  7. Multiple drug allergies and intolerances including PLAVIX, CIPRO,      and STATIN.   RECOMMENDATIONS:  Mr. Hennings has multiple symptoms that are suggestive  of allergies.  She has recurrent urticaria or angioedema.  She also has  recurrent sinus congestion, which could be an allergic problem.  We  will  arrange her to see Dr. Stevphen Rochester in consultation for this.  She  also has exertional chest pain and I am concerned that this could be an  ischemic equivalent.  We will plan to arrange for her to have an  exercise rest-stress Myoview scan to evaluate this.  We will be in touch  by phone after having the results and decide about followup.  I will  plan to see her in followup in 4 months, but we will plan to see her  sooner if needed depending on the results of her test.     Bruce R. Juanda Chance, MD, [facility]  Electronically Signed    BRB/MedQ  DD: 07/30/2008  DT: 07/31/2008  Job #: [id number]

## 2011-04-13 NOTE — Discharge Summary (Signed)
[name] [name], [name] [name]                 ACCOUNT NO.:  [id number]   MEDICAL RECORD NO.:  [id number]          PATIENT TYPE:  INP   LOCATION:  2921                         FACILITY:  [facility]   PHYSICIAN:  Jonelle Sidle, MD DATE OF BIRTH:  Apr 05, 1932   DATE OF ADMISSION:  03/08/2008  DATE OF DISCHARGE:  03/10/2008                               DISCHARGE SUMMARY   PRIMARY CARDIOLOGIST:  Dr. Charlies Constable.   PRIMARY CARE Franz Svec:  Atha Starks. Bean, FNP.   PROCEDURES PERFORMED DURING HOSPITALIZATION:  Cardiac catheterization  completed by Dr. Charlies Constable on March 08, 2008, with successful PCI of  tandem lesions in the mid and distal LAD with improvement of a mid  lesion from 90% to 0% and improvement in distal lesion from 80% to 0%  using promise drug-eluting stents, nonoverlapping.   FINAL DISCHARGE DIAGNOSES:  1. Coronary artery disease, status post drug-eluting stent to the left      anterior descending on March 08, 2008, using 2 promise drug-eluting      stents nonoverlapping.  2. Mild renal insufficiency, creatinine elevation of 1.25.  3. Urinary tract infection, going home on Cipro.  4. Non-Hodgkin's lymphoma.  5. Hypertension.  6. Hyperlipidemia.   HOSPITAL COURSE:  This is a 77 year old female with known history of  coronary artery disease and distal disease in the LAD, which had been  managed medically.  She developed increasing angina.  The patient was  admitted secondary to abnormal catheterization, which was done earlier  in the week, which revealed new tight lesions in the mid and distal LAD.  She was admitted for intervention.   The patient tolerated the procedure well without any complaints.  The  patient did have 2 drug-eluting promise stents placed per Dr. Juanda Chance.  It was noted that she had some mild elevation in her creatinine of 1.25.  The patient's creatinine was followed with hydration and on day of  discharge, was 1.1.  The patient was found also to have a UTI and  was  started on Cipro 250 mg twice a day.  The patient had some abdominal  pain and mild soreness at her right groin site with some mild swelling.  She had an abdominal and pelvic CT with showed no obvious  retroperitaneal bleed.  There was a probable small hematoma at the  access site.  Could not entirely exclude a pseudoaneurysm although no  bruit on exam and patient felt much better the next day. The patient is  to follow up as an outpatient next week for right groin ultrasound to  rule out pseudoaneurysm.  The patient was seen and examined by Dr. Simona Huh on discharge and found to be stable.   DISCHARGE VITAL SIGNS:  Blood pressure 131/71, pulse 73, respirations  16, O2 sat 95% on room air, and temperature 98.5.   DISCHARGE LABS:  Sodium 140, potassium 4.9, chloride 102, CO2 of 33,  glucose 104, BUN 11, and creatinine 1.18.  Hemoglobin 13.0, hematocrit  37.8, white blood cells 6.6, and platelets of 173.  Urinalysis was  positive for UTI.  DISCHARGE MEDICATIONS:  1. Cipro 250 mg twice a day for 3 days only (prescription provided).  2. Aciphex 20 mg daily.  3. Metoprolol 50 mg daily.  4. Cozaar 50 mg daily.  5. Pravastatin 10 mg at bedtime.  6. Zyrtec 10 mg daily.  7. Aspirin 325 daily.  8. Super B vitamin complex daily.  9. Cranberry daily.  10.PreserVision daily  11.Darvocet-N 100 as needed.  12.Plavix 75 mg daily.  13.Nitrofurantoin 100 mg daily.  14.Azo p.r.n.   ALLERGIES:  CODEINE and SULFA.   FOLLOWUP PLANS AND APPOINTMENTS:  1. The patient will have her right groin ultrasound to rule out      pseudoaneurysm early next week, the week of March 11, 2008, in our      office.  2. The patient is to follow up with Dr. Charlies Constable on a previously      scheduled appointment on Apr 02, 2008.  3. The patient is to follow with primary care physician for continued      medical management.  4. The patient has been given post cardiac catheterization      instructions  with particular emphasis on the right groin site for      evidence of bleeding, hematomas, signs of infection or severe pain.   TIME SPENT WITH THE PATIENT TO INCLUDE PHYSICIAN TIME:  30 minutes.      Bettey Mare. Lyman Bishop, NP      Jonelle Sidle, MD  Electronically Signed    KML/MEDQ  D:  03/10/2008  T:  03/11/2008  Job:  [id number]

## 2011-04-13 NOTE — Assessment & Plan Note (Signed)
[facility] HEALTHCARE                                 ON-CALL NOTE   NAME:Scahill, NIANG MITCHELTREE                        MRN:          [id number]  DATE:03/13/2008                            DOB:          1932/05/19    PRIMARY CARDIOLOGIST:  Everardo Beals. Juanda Chance, MD.   Ms. Carmon was discharged from the hospital on March 10, 2008, after  having drug-eluting stents placed in the mid and distal LAD.  She had  called on March 11, 2008, because of a rash.  She had two new  medications, Cipro and Plavix.  She went to the local emergency room as  instructed and received Benadryl and prednisone.  She was asked to stop  the Cipro and did so.  Today she called back because she stated she  feels like the rash is getting worse, not better.  Upon review of the  records I see on a note from approximately 2 years ago that she was  listed as having an allergy to Plavix.  I therefore advised her she  would need to stop the Plavix.  I called her in a prescription of Ticlid  to her preferred pharmacy.  I will have the office contact her to come  in and get blood work done in a week.  She understands if she has any  reaction to the Ticlid or her symptoms do not get better on the current  dose of prednisone, she is to contact our office immediately but not  skip any doses of the Ticlid unless advised by cardiology to do so.  Ms.  Lamb was not having any chest pain or shortness of breath and was  otherwise doing well.  She was in agreement with this as a plan of care.      Theodore Demark, PA-C  Electronically Signed      Luis Abed, MD, [facility]  Electronically Signed   RB/MedQ  DD: 03/13/2008  DT: 03/13/2008  Job #: [id number]

## 2011-04-13 NOTE — Assessment & Plan Note (Signed)
[facility] HEALTHCARE                                 ON-CALL NOTE   NAME:[name] [name], [name] [name]                        MRN:          [id number]  DATE:03/08/2008                            DOB:          [birth year]    PRIMARY CARDIOLOGIST:  Dr. Juanda Chance.   SUPERVISING PHYSICIAN:  Dr. Antoine Poche.   SUMMARY OF HISTORY:  [name] [name] is a 77 year old female who calls on the  morning of March 07, 2008, complaining of a possible urinary tract  infection.  She states that she was recently seen by Dr. Juanda Chance and  underwent cardiac catheterization earlier this week, and she was  instructed to come back to the hospital on March 08, 2008, for  intervention on a blockage.  However, she states that she woke up this  morning with intent urinary burning and frequency.  She states that she  is not running a temperature, but she is not sure if she should have the  procedure tomorrow or not.   I suggested that she contact her primary care physician at [facility]  to have a urinalysis and to possibly be placed on antibiotics.  I  explained to her that after this is done, I will have Dr. Regino Schultze nurse  follow-up with [facility] to find out the results of the urinalysis  and also discussed with Dr. Juanda Chance if she should proceed to cardiac  catheterization.  I further explained to her that after Dr. Juanda Chance hears  the results of her urinalysis, then his nurse well contact her on the  afternoon of April 9 to let her know if they should postpone the cardiac  catheterization/intervention or proceed.  [name] [name] was acceptable with  that plan.     Joellyn Rued, PA-C  Electronically Signed    EW/MedQ  DD: 03/08/2008  DT: 03/08/2008  Job #: [id number]

## 2011-04-13 NOTE — Cardiovascular Report (Signed)
[name] [name], [name] [name]                 ACCOUNT NO.:  [id number]   MEDICAL RECORD NO.:  [id number]          PATIENT TYPE:  INP   LOCATION:  2921                         FACILITY:  [facility]   PHYSICIAN:  Everardo Beals. Juanda Chance, MD, FACCDATE OF BIRTH:  1932-08-06   DATE OF PROCEDURE:  03/08/2008  DATE OF DISCHARGE:                            CARDIAC CATHETERIZATION   CLINICAL HISTORY:  [name] [name] is 77 years old and has a known coronary  disease with distal disease in the LAD, which has been managed  medically.  Recently, she developed increasing angina.  We brought her  in for an AV cath earlier this week and found that she had developed new  tight lesions in the mid and mid-to-distal LAD.  We brought her back  today for intervention.  These were heavily calcified, and we planned to  use rotational atherectomy.   PROCEDURE:  The procedure was performed via the  right femoral  arterial  sheath and a 7-French Q-3.5 guiding catheter with side holes.  The  patient was given Angiomax  bolus infusion and was loaded with 300 mg of  Plavix and was given 4 chewable aspirin.  She was given Benadryl for a  shellfish allergy.  We first navigated a Rotafloppy wire down the LAD  across the lesions in the mid and distal vessel.  We then performed  rotational atherectomy using a 1.5 bur.  We performed approximately 5  runs for approximately 10 seconds each at 155,000 RPMs.  There was only  a moderate amount of resistance.  We then used a 2.25 x 20 mm Maverick  balloon and dilated both lesions up to 8 atmospheres for 20 seconds.  We  then deployed a 2.5 x 28 mm Palmaz stent at the lesion site in the mid  LAD.  The proximal edge of the stent was just after a moderate-sized  diagonal branch.  We deployed this with one inflation of 12 atmospheres  for 30 seconds.  We then postdilated with a 2.75 x 20 mm Quantum  Maverick performing two inflations up to 20 atmospheres for 30 seconds.  We then deployed a 2.5 x 8 mm stent  at the lesion in the distal LAD.  We  deployed this with one inflation of 16 atmospheres for 30 seconds.   We then decided that the stent in the mid LAD was not quite fully  expanded in its proximal portion, so went in with a 3.0 x 12 mm Castor  Voyager and performed one inflation up to 18 atmospheres for 30 seconds  which gave full expansion of the balloon, which we did not have  previously.  The balloon was removed, and the final diagnostics were  then performed through the guiding catheter.  The patient tolerated  procedure well and left the laboratory in satisfactory condition.   RESULTS:  Initially, the stenosis in the mid LAD was estimated 90%.  Following stenting, this improved to 0%.   Initially, the stenosis in the distal LAD was 80%, and following  stenting, this improved to 0%.   CONCLUSION:  Successful PCI of tandem  lesions in the mid and distal left  anterior descending artery with improvement in the mid lesion from 90%  to 0% and improvement in distal lesion from 80% to 0% using Promus drug-  eluting stents, not overlapping.   DISPOSITION:  The patient was taken to  postanesthesia for further  observation.  She should remain on Plavix for at least a year.      Bruce Elvera Lennox Juanda Chance, MD, [facility]  Electronically Signed     BRB/MEDQ  D:  03/08/2008  T:  03/09/2008  Job:  [id number]   cc:   [name] D. Bean, FNP  [name] [name]  Cardiopulmonary Lab

## 2011-04-13 NOTE — Assessment & Plan Note (Signed)
[facility] HEALTHCARE                            CARDIOLOGY OFFICE NOTE   NAME:[name] [name], [name] [name]                        MRN:          [id number]  DATE:04/01/2008                            DOB:          October 04, 1932    PRIMARY CARE PHYSICIAN:  Everrett Coombe, FNP at [facility].   CLINICAL HISTORY:  [name] [name] is 77 years old and returns for a follow-  up visit after a recent percutaneous coronary intervention.  She had  been cathed a few years ago and had coronary artery disease that we  managed medically. She recently had increased symptoms and we brought  her in to the [facility] lab where she had progression of disease in the mid and  distal LAD.  We brought her back and put non non-overlapping drug-  eluting stents in the mid and distal LAD.  She went home on Cipro for a  urinary tract infection as well as Plavix and developed hives and  urticaria and was seen at [facility]. [name] [name] was involved in  the discussions at this time and the Cipro and Plavix were discontinued  and she was put on Ticlid and she was treated with steroids.  She had a  difficult time for several days but subsequently  improved.   She has had no recurrence of the chest pain that precipitated her  catheterization procedure.   PAST MEDICAL HISTORY:  Significant for non-Hodgkin's lymphoma followed  by CT scan.  She has hyperlipidemia and has been intolerant to statins  due to muscle aches.  She also has hypertension.   CURRENT MEDICATIONS:  1. Ticlid 250 mg b.i.d.  2. Toprol 50  mg in the morning and 25 in the afternoon.  3. Aspirin.  4. Cozaar 50 mg daily.  5. Zyrtec.  6. AcipHex.   PHYSICAL EXAMINATION:  Blood pressure is 120/80 and the pulse 68 and  regular.  There was no venous tension.  The carotid pulses were full  without bruits.  CHEST:  Clear.  HEART:  Rhythm was regular.  No murmurs or gallops.  ABDOMEN:  Soft without organomegaly.  Peripherals pulses were full with no  peripheral edema.   The right femoral artery site was well-healed.  There was a small knot.   IMPRESSION:  1. Coronary artery disease status post recent percutaneous coronary      intervention of the mid and distal left anterior descending with      drug-eluting stents now stable.  2. Urticaria secondary most probably to either Cipro or Plavix,      currently on Ticlid.  3. Hyperlipidemia with intolerance to STATINS.  4. Hypertension.  5. Non-Hodgkin's lymphoma.  6. History of multiple allergies including PLAVIX, CIPRO and STATINS.   RECOMMENDATIONS:  I think [name] [name] is doing well from the standpoint  of her heart.  Will plan to continue her current medications.  She is  not on any cholesterol medications and her LDL was quite high before.  Will get a repeat baseline lipid profile this week and then give her a  retrial of Zetia 10  mg a day with a follow-up lipid profile in a month.  She does have a history of some intolerance to Zetia in the past but I  think given the low side effect profile of this drug, I think it would  be worth a rechallenge.  We will get a follow-up lipid profile in 1  month and I will see her in 4 months.     Bruce Elvera Lennox Juanda Chance, MD, [facility]  Electronically Signed    BRB/MedQ  DD: 04/01/2008  DT: 04/01/2008  Job #: [id number]

## 2011-04-13 NOTE — Assessment & Plan Note (Signed)
[facility] HEALTHCARE                            CARDIOLOGY OFFICE NOTE   NAME:[name] [name], [name] [name]                        MRN:          [id number]  DATE:12/11/2008                            DOB:          12-06-1931    PRIMARY CARE PHYSICIAN:  Atha Starks. Bean, FNP   CLINICAL HISTORY:  [name] [name] is a 77 years old who returned for a  followup management of coronary heart disease.  In April 2009, she  underwent overlapping drug-eluting stents in the mid and distal LAD.  She has done quite well since that time from the standpoint of her  heart.  She was having some chest pain when I saw her last March, and we  did a Myoview scan, but this showed no evidence of ischemia.   She has had some problem with reactions to medication.  She developed a  skin rash while on Cipro and Plavix and she has been on Ticlid since  that time.  She saw Dr. Stevphen Rochester recently and he tested her for  allergies and has her on Zyrtec, Claritin, and Zantac.   PAST MEDICAL HISTORY:  Significant for a non-Hodgkin lymphoma followed  by CT scans.  She has hyperlipidemia, but has been intolerant to STATINS  due to muscle aches.  She also has hypertension.   CURRENT MEDICATIONS:  1. Ticlid 250 mg b.i.d.  2. Zyrtec.  3. Cozaar 50 mg daily.  4. PreserVision 1 daily.  5. Metoprolol ER 100 mg in the morning and 25 mg in the afternoon.  6. Aspirin 81 mg.  7. Claritin.  8. Prevacid 20 mg daily.  9. Zantac 150 mg b.i.d.   PHYSICAL EXAMINATION:  VITAL SIGNS:  The blood pressure is 128/60 and  the pulse 68 and regular.  NECK:  There was no venous tension.  The carotid pulses were full  without bruits.  CHEST:  Clear.  HEART:  Rhythm was regular.  I could hear no murmurs or gallops.  ABDOMEN:  Soft.  No organomegaly.  EXTREMITIES:  Peripheral pulses were full with no peripheral edema.   IMPRESSION:  1. Coronary artery disease status post tandem non-overlapping stents      in the left anterior  descending coronary artery in April 2009, now      stable.  2. Good left ventricular function.  3. Hypertension.  4. Hyperlipidemia, intolerance of STATINS.  5. Urticaria secondary to Cipro and Plavix, currently on Ticlid.  6. Multiple allergies.  7. History of non-Hodgkin lymphoma.   RECOMMENDATIONS:  I think, [name] [name] is doing very well.  We will plan  to get laboratory studies today including a lipid, CBC, BMP, and TSH.  I  will plan to see her back in a year.     Bruce Elvera Lennox Juanda Chance, MD, [facility]  Electronically Signed    BRB/MedQ  DD: 12/11/2008  DT: 12/11/2008  Job #: [id number]

## 2011-04-13 NOTE — Assessment & Plan Note (Signed)
[facility] HEALTHCARE                                 ON-CALL NOTE   NAME:[place], [facility]                          MRN:          [id number]  DATE:12/02/2007                            DOB:          10/31/1932    Telephone number [phone].   SUBJECTIVE:  Possible allergic reaction with bottom lip swollen.  No  tongue swelling.  No shortness of breath.  She does have a rash across  her body.  She is unsure what the allergen could be.   ASSESSMENT/PLAN:  Come to Saturday clinic first thing.     Kerby Nora, MD  Electronically Signed    AB/MedQ  DD: 12/02/2007  DT: 12/02/2007  Job #: [id number]

## 2011-04-13 NOTE — Cardiovascular Report (Signed)
NAMEAZELEA, SEGUIN                 ACCOUNT NO.:  [id number]   MEDICAL RECORD NO.:  [id number]           PATIENT TYPE:   LOCATION:                                 FACILITY:   PHYSICIAN:  Bruce R. Juanda Chance, MD, FACCDATE OF BIRTH:  [birth year]   DATE OF PROCEDURE:  03/04/2008  DATE OF DISCHARGE:                            CARDIAC CATHETERIZATION   PAST MEDICAL HISTORY:  Ms. Tomeo is 77 years old and has known coronary  disease with distal occlusion of the LAD which has been managed  medically.  Recently she developed increasing symptoms of chest pain  with exertion, and I saw her in the office and scheduled her for  evaluation in the outpatient laboratory for today.  She also has a non-  Hodgkin's lymphoma, hyperlipidemia, and hypertension.   DESCRIPTION OF PROCEDURE:  The procedure was performed via the femoral  and arterial sheath and 4-French preformed coronary catheters.  A front  wall arterial puncture was and Omnipaque contrast was used.  The patient  tolerated the procedure well and left the laboratory in satisfactory  condition.   RESULTS:  1. Left main coronary main was free of significant disease.  2. The left anterior descending artery gave rise to a diagonal branch      and several septal perforators.  The LAD was heavily calcified.      There was 50% ostial stenosis in the diagonal branch.  There was      40% narrowing in the mid-LAD and there was 90% narrowing in the mid-      to-distal LAD.  There was 50% and 90% stenoses in the distal LAD.  3. The circumflex artery  gave rise to a small marginal branch, an      atrial branch, and two posterolateral branches.  These vessels were      free of significant disease.  4. The right coronary artery was a moderate-sized vessel that gave      rise to a conus branch, right ventricle branch, posterior branch      and three posterolateral branches.  This had some irregularity but      no significant obstruction.   LEFT  VENTRICULOGRAM:  The left ventriculogram was performed in the RAO  projection. It showed good wall motion with no areas of hypokinesis.  The estimated ejection fraction was 60%.   The aortic pressure was 166/83 with a mean of 120 and left ventricular  pressure is 166/24.   CONCLUSION:  Coronary artery disease with 40% and 90% stenosis in the  mid-LAD, 50% and 90% stenosis in the distal LAD, 50% stenosis in the  diagonal branch of the LAD, no significant obstruction in the circumflex  and right coronaries with normal LV function.   RECOMMENDATIONS:  The patient has progressed the lesion in the mid-LAD  from 70%-90%; and I suspect that this is responsible for her increased  symptoms.  The vessel was heavily calcified; but, I think, that it is  suitable for intervention.  We will plan to schedule that later this  week.      Smitty Cords  R. Juanda Chance, MD, [facility]  Electronically Signed     BRB/MEDQ  D:  03/04/2008  T:  03/04/2008  Job:  [id number]   cc:   Willaim Sheng D. Bean, FNP

## 2011-04-16 NOTE — Assessment & Plan Note (Signed)
[facility]                              CARDIOLOGY OFFICE NOTE   NAME:[name] [name], [name] [name]                        MRN:          [id number]  DATE:09/13/2006                            DOB:          [birth year]    PRIMARY CARE PHYSICIAN:  Atha Starks. Bean, FNP, in [place].   CLINICAL HISTORY:  [name] [name] is 77 years old and has coronary artery  disease with lesions in the mid and distal LAD in a small vessel which we  have been managing medically.  She has chronic angina that has been fairly  well controlled.   She came in today because of increased edema in the lower extremity.  We  thought this might be related to Norvasc and we stopped this a little bit  prior to her office visit today.  She says that it has gotten somewhat  better.  She says that she worn support hose in the past for mild edema but  this has gotten somewhat worse.  She also indicated she had some pain in her  left foot and wondered if this was related to the swelling.  This was  located in the region of the plantar fascia.   PAST MEDICAL HISTORY:  Significant for a history of non-Hodgkin's lymphoma.  She also has a history of being intolerant to VYTORIN and is on Zetia.  She  has multiple allergies including PLAVIX and CIPRO.  She also has  hypertension.   CURRENT MEDICATIONS:  Include aspirin, AcipHex, Imdur, Toprol-XL, Zyrtec,  Zetia, Mobic, and Cozaar.   EXAMINATION TODAY:  VITAL SIGNS:  The blood pressure was 138/88 and the  pulse 70 and regular.  NECK:  There was no venous distention.  The carotid pulses were full without  bruits.  CHEST:  Clear.  CARDIAC:  Rhythm was regular.  No murmurs, no gallops.  ABDOMEN:  Soft without organomegaly.  EXTREMITIES:  There was only trace edema of the lower extremities.  The  pedal pulses were equal.   IMPRESSION:  1. Edema lower extremities now improved off Norvasc.  2. Coronary artery disease managed medically with stable  angina.  3. Hypertension.  4. Hyperlipidemia, intolerant to VYTORIN.  5. Non-Hodgkin's lymphoma.  6. Multiple allergies including PLAVIX and CIPRO.  7. Fatigue.   RECOMMENDATIONS:  [name] [name] edema is better since she has been off the  Norvasc.  She also appears to have some plantar fasciitis on the left and  this may be contributing to some of the swelling in the foot, and I told her  the Mobic would help with that.  She complained of symptoms of fatigue and I  think the Zyrtec may contribute to that, and we have cut her back from 10 to  5 a day and hope that will help.  Her blood pressure seems to be controlled  okay without the Norvasc so will leave her other medications the same.  I  will plan to see her back in a year and she will follow up Everrett Coombe, FNP,  in the interim.  ______________________________  Everardo Beals Juanda Chance, MD, [facility]     BRB/MedQ  DD:  09/13/2006  DT:  09/14/2006  Job #:  [id number]

## 2011-04-16 NOTE — Assessment & Plan Note (Signed)
[name] [name]                         GASTROENTEROLOGY OFFICE NOTE   NAME:[name], [name] WOOLFORD                        MRN:          [id number]  DATE:02/09/2007                            DOB:          May 06, 1932    [name] returns today, is really fairly asymptomatic.  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  This really made no difference in her GI symptomatology.  She has recent CT scan of her chest, abdomen, and pelvis, all of which  were normal without any evidence of any recurrent lymphoma.  I have  given her Hemoccult cards for guaiac testing.  If these are negative, we  will repeat her colonoscopy exam in November 2009.  If these are  positive, we will repeat her exam as needed.  She is to continue all of  her other medications including her AcipHex 20 mg daily for chronic acid  reflux and her chronic NSAID use.     Vania Rea. Jarold Motto, MD, Caleen Essex, FAGA  Electronically Signed    DRP/MedQ  DD: 02/09/2007  DT: 02/10/2007  Job #: [id number]   cc:   Rose Phi. Myna Hidalgo, M.D.  Arta Silence, MD

## 2011-04-16 NOTE — Cardiovascular Report (Signed)
[name] [name], [name] [name]                 ACCOUNT NO.:  [id number]   MEDICAL RECORD NO.:  [id number]          PATIENT TYPE:  INP   LOCATION:  3737                         FACILITY:  [facility]   PHYSICIAN:  Charlies Constable, M.D. [facility] DATE OF BIRTH:  1932-09-02   DATE OF PROCEDURE:  11/24/2004  DATE OF DISCHARGE:                              CARDIAC CATHETERIZATION   CLINICAL HISTORY:  [name] [name] is 77 years old and was recently admitted with  exertional chest and back pain and then severe rest pain.  Her enzymes were  negative but she had recurrent chest pain so she was scheduled for  evaluation of angiography.  She indicated she had a previous catheterization  done in 1998 showing a nonobstructive coronary disease.  She had a  Cardiolite in June of 2003 which did not show any ischemia and showed an  ejection fraction of 72%.   PROCEDURE:  The procedure was performed with the right femoral artery and  arterial sheath and 6 French sheath performing coronary catheters.  _____  was performed and Omnipaque contrast was used.  The right femoral artery was  closed with Angio-Seal.  The patient tolerated the procedure well and left  the laboratory in satisfactory condition.   RESULTS:  The left coronary artery:  The left main coronary artery was free  of significant disease.   The left anterior descending artery:  The left anterior descending artery  had moderate calcification.  The LAD gave rise to two diagonal branches, a  large and several small septal perforators.  There was 30% narrowing of the  proximal LAD.  There was a long area of 70% narrowing in the mid LAD.  There  was 80% narrowing in the distal LAD as it wrapped around the apex.  There  was 50% narrowing in the ostium of the diagonal branch.  The segments gave  rise to two marginal branches and the posterolateral branch.  There was mild  irregularity but no significant obstruction.   The right coronary artery gave rise to a conus branch.   Two right  ventricular branches, the posterior descending branch and three  posterolateral branches.  There were irregulars in the right coronary artery  but no significant obstruction.   The left ventriculogram was performed in the RAO projection showed good wall  motion but no areas of hypokinesis.  The estimated ejection fraction was  60%.   The aortic pressure was 164/92 with a mean of 124 and left mid pressure is  164/9.   CONCLUSION:  Coronary artery disease with 70% mid and 80% distal stenosis in  the left anterior descending with 50% stenosis in the second diagonal  branch, irregularities in the circumflex and right coronary artery and  normal left ventricular function.   RECOMMENDATIONS:  I think that [name] [name]'s symptoms may be ischemic  although her most severe disease is distal.  We will plan initial medical  management and plan to evaluate with a Cardiolite scan as an outpatient.  If  her Cardiolite scan suggests anterior ischemia and if her symptoms persist,  then I would  consider intervention on the  mid left anterior descending lesion.  I am not certain that her symptoms are  related to her coronary disease and they were quite impressive on admission  and will plan to evaluate her with a spiral CT scan to rule out pulmonary  embolism and other causes.       BB/MEDQ  D:  11/24/2004  T:  11/24/2004  Job:  [id number]   cc:   Laurita Quint, M.D.  [address] [place]  [place] [address]  Fax: [phone]   Legrand Rams, M.D.

## 2011-04-16 NOTE — H&P (Signed)
NAMETEIA, FREITAS                 ACCOUNT NO.:  [id number]   MEDICAL RECORD NO.:  [id number]          PATIENT TYPE:  INP   LOCATION:  3737                         FACILITY:  [facility]   PHYSICIAN:  Arvilla Meres, M.D. LHCDATE OF BIRTH:  1932/04/09   DATE OF ADMISSION:  11/22/2004  DATE OF DISCHARGE:                                HISTORY & PHYSICAL   PRIMARY CARE PHYSICIAN:  Laurita Quint, M.D. and Everrett Coombe, N.P.  Cardiologist is Charlies Constable, M.D. [facility].   HISTORY OF PRESENT ILLNESS:  The patient is a very pleasant 77 year old  female with a history of non-cardiac chest pain by cardiac catheterization  in 1998 as well as mitral valve prolapse who is admitted through the ER for  further evaluation of six-hour episode of chest pain.   She reports a long history of exertional chest pain for which she had a  cardiac catheterization in 1998 which showed only a 30% LAD lesion.  More  recently, she had an Adenosine Cardiolite in 2003 which had an EF of 72%  without any ischemia or infarct.   She says her chest pain has been relatively quiet over the last few months  until this afternoon when she had a severe episode of central chest pain  radiating to her back and arms which she experienced while lifting and  bending.  She says this is much like her typical chest pain, however, the  pain persisted for greater than six hours and she was unable to get rid of  it by rubbing her back which usually makes the pain go away.  So she came to  the ER for further evaluation.  In the emergency room, the pain resolved  spontaneously.  Her first set of cardiac markers and EKG were negative.  She  denied any reflux symptoms.  The rest of her review of systems was  essentially negative.   PAST MEDICAL HISTORY:  1.  Noncardiac chest pain per HPI.  2.  Hypertension.  3.  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  4.  Status post hysterectomy.  5.  Status  post appendectomy.  6.  Mitral valve prolapse.  7.  Osteoarthritis.   MEDICATIONS:  1.  Toprol XL 75 mg a day.  2.  Aspirin 325 mg.  3.  Aciphex 20 mg.  4.  Multivitamins.   ALLERGIES:  SHELLFISH, IODINE, SULFA.   SOCIAL HISTORY:  She is married.  She lives in [place].  She is a retired Psychologist, forensic.  She denies any alcohol or tobacco.   FAMILY HISTORY:  Her father had a history of CAD and died at age 104.   PHYSICAL EXAMINATION:  GENERAL:  She is comfortable.  VITAL SIGNS:  Blood pressure is 171/96 in the right arm, 168/91 in the left  arm, heart rate is 72.  She is afebrile.  HEENT:  Sclerae anicteric.  EOMI.  NECK:  Supple.  There is no JVD.  Carotids are 2+ bilaterally without any  bruits.  There is no lymphadenopathy or  thyromegaly.  HEART:  She has a regular rate and rhythm with a normal S1 and S2, plus S4.  There is no murmur.  LUNGS:  Clear to auscultation.  BACK:  She has exquisite pain to palpation on the left cervical paraspinal  muscle which reproduces her symptoms exactly.  ABDOMEN:  Benign.  Soft, nontender, and nondistended with no bowel sounds.  There are no palpable masses.  EXTREMITIES:  There is no cyanosis, clubbing, or edema.  They are warm.  CP  pulses are 2+ bilaterally.  NEUROLOGY:  She is alert and oriented x3 and otherwise nonfocal.   LABORATORY DATA:  White count 4.6, hematocrit 38%, platelets 216.  Sodium  142, potassium 4.4, creatinine 1.2.  BNP is 75.  CK-MB 1.1, troponin is less  than 0.05.   Chest x-ray is normal sinus rhythm at 72 with no ST T wave changes.  Chest x-  ray is normal.  The mediastinum is not widened.   ASSESSMENT:  Given her history as well as her prolonged history of chest  pain with negative cardiac markers and reproducibility of the symptoms with  palpation, I have a very low suspicion for myocardial ischemia.  This is  most likely musculoskeletal.  However, we will observe her overnight for  rule out MI.  We can likely do an  outpatient stress test in the next week or  two.  Despite her symptoms, she has no evidence of dissection on her chest x-  ray and her pulses and blood pressures are symmetric bilaterally.  We will  add hydrochlorothiazide and increase her Toprol for her hypertension.  She  may possibly benefit from switching her Toprol to Coreg which will give her  more blood pressure control.  This can be decided as an outpatient.      Dani   DB/MEDQ  D:  11/23/2004  T:  11/23/2004  Job:  [id number]   cc:   Laurita Quint, M.D.  [address] [place]  [place] [address]  Fax: [phone]   Everrett Coombe, N.P.   Charlies Constable, M.D. [facility]

## 2011-04-16 NOTE — Discharge Summary (Signed)
[name] [name], [name] [name]                 ACCOUNT NO.:  [id number]   MEDICAL RECORD NO.:  [id number]          PATIENT TYPE:  INP   LOCATION:  3737                         FACILITY:  [facility]   PHYSICIAN:  Carole Binning, M.D. LHCDATE OF BIRTH:  01/06/1932   DATE OF ADMISSION:  11/22/2004  DATE OF DISCHARGE:  11/25/2004                           DISCHARGE SUMMARY - REFERRING   DISCHARGE DIAGNOSIS:  1.  Chest pain, resolved.  2.  Coronary artery disease, medical treatment.  3.  Elevated creatinine at 1.5 (will need a BMP next week).  4.  Hypertension, controlled.  5.  History of rectal lymphoma status post radiation therapy and      chemotherapy which was completed in June 2005.  6.  Mitral valve prolapse.  7.  Osteoarthritis.  8.  Status post hysterectomy.  9.  Status post appendectomy.   HOSPITAL COURSE:  [name] was admitted on November 22, 2004, with  substernal chest pain.  Her workup included serial cardiac isoenzymes which  were negative.  Because of her persistent pain, she underwent cardiac  catheterization the following day which revealed a 70% mid LAD, 80% distal  LAD, 50% second diagonal, circumflex and right coronary artery were  angiographically normal, LV was 60% with no wall motion abnormalities.  At  this point, the patient will be treated medically and we will perform a  Cardiolite as an outpatient.  If the Cardiolite does show ischemia, then we  will consider percutaneous intervention to the mid and distal LAD lesion.  We have added Plavix and Imdur to her medical regimen.  As part of her  workup, we did perform a VQ scan to evaluate for pulmonary embolus and this  was negative.  Other lab studies included total cholesterol 196,  triglycerides 110, HDL 49, LDL 125.  Her EKG showed normal sinus rhythm,  rate 90, no acute ST or T wave changes.  Chest x-ray showed left basilar  scar versus subsegmental atelectasis.   The patient was felt ready for discharge to home on  November 25, 2004.  She  was discharged home on the following medications:  Enteric coated aspirin  325 mg a day, AcipHex 20 mg daily, multi-vitamin daily.  She is to increase  her Toprol to Toprol XL 100 mg a day.  Her new medications include Plavix 75  mg daily and Imdur 30 mg daily.  She is to use sublingual nitroglycerin as  needed for chest pain, no straining, no lifting over 10 pounds for one week.  Remain on a low fat diet.  Clean the cath site with soap and water, no  scrubbing.  She has a follow up visit with Dr. Edwyna Shell on December 07, 2004, at  11:45 a.m., and also setting up a Cardiolite next week and will give her  instructions for that procedure.      Larita Fife   LB/MEDQ  D:  11/25/2004  T:  11/25/2004  Job:  [id number]   cc:   Charlies Constable, M.D. [facility]   Laurita Quint, M.D.  [address] [place]  [place] [address]  Fax: [phone]

## 2011-04-16 NOTE — Assessment & Plan Note (Signed)
[facility]                              CARDIOLOGY OFFICE NOTE   NAME:[name] [name], [name] [name]                        MRN:          [id number]  DATE:08/10/2006                            DOB:          01/10/1932    CLINICAL HISTORY:  [name] [name] is 77 years old and has coronary disease, and  had a catheterization performed in December of 2005 with a 70% mid and 80%  distal LAD.  She has had chronic angina since that time.  She said she still  has chest discomfort when she carries bags up the stairs or does something  strenuous.  This is fairly predictable.  She has really not had much in the  way of rest symptoms.  She usually does not take a nitroglycerin for this,  but rather just rests and her symptoms go away.  She has had no palpitations  or shortness of breath.   She also has hyperlipidemia and she recently developed muscle aches on  Zetia.  She had previously been intolerant to Vytorin.  She has been off  Zetia for about 3 weeks and she was put on Mobic recently.   PAST MEDICAL HISTORY:  Significant for non-Hodgkin's lymphoma.   CURRENT MEDICATIONS:  Include aspirin, AcipHex, Imdur, Toprol, Zyrtec,  alprazolam, Cozaar, __________, vitamins, and Mobic.   PHYSICAL EXAM:  VITAL SIGNS:  On examination, blood pressure is 167/96 and  pulse 74 and regular.  There is no venous distension.  Carotid pulses are full without bruits.  CHEST:  Clear.  CARDIAC:  Rhythm is regular.  No murmurs or gallops.  ABDOMEN:  Was soft without organomegaly.  EXTREMITIES:  Peripheral pulses are full and there is no peripheral edema.   An EKG today was normal.   IMPRESSION:  1. Coronary artery disease with stable angina.  2. Hypertension.  3. Hyperlipidemia with INTOLERANCE TO VYTORIN and POSSIBLY ZETIA.  4. MULTIPLE ALLERGIES including PLAVIX and CIPRO.  5. Non-Hodgkin's lymphoma.   RECOMMENDATIONS:  [name] [name] is still having symptoms that are fairly  characteristic  of angina.  Blood pressure is also poorly controlled.  Will  increase her Toprol from 100 to 125 a day and we will add Norvasc 10 a day  to her current regimen.  We will decide later if she needs both Norvasc and  Cozaar.  She is willing to try and resume the Zetia now that the Mobic has  cleared up her muscle aches.  We will get a lipid profile in about four  weeks and I will see her back in about 6 weeks to decide about further  adjustments in her anti-anginal therapy.                               Bruce Elvera Lennox Juanda Chance, MD, [facility]    BRB/MedQ  DD:  08/10/2006  DT:  08/10/2006  Job #:  [id number]

## 2011-04-16 NOTE — Assessment & Plan Note (Signed)
[name]                         GASTROENTEROLOGY OFFICE NOTE   NAME:[name] [name], [name] [name]                        MRN:          [id number]  DATE:01/24/2007                            DOB:          Sep 17, 1932    [name] [name] is a middle-aged white female who has had MALT-type rectal lymphoma  treated with radiation therapy and chemotherapy in March 2005.  She  currently is having no lower GI problems and sees Dr. Donnie Coffin and has  regular CT scans with one due in July.  She really denies any GI  complaints such as diarrhea or rectal bleeding but does have a lot of  abdominal gas, bloating, and occasional loose stool.  Last colonoscopy  exam was completed in November 2006 and was unremarkable except for some  diverticulosis.  Review of her chart shows that she has had some mild  abnormal liver function tests, probably related to her statin medication  and this has been discontinued.  She does suffer from coronary artery  disease, stable angina, hypertension, and hyperlipidemia.  She also has  multiple drug allergies including PLAVIX.   She currently has no steatorrhea but does occasionally have loose stools  and complains of a lot of abdominal gas and bloating.  She takes AcipHex  20 mg a day for acid reflux symptoms.  It is of note that she is on  Mobic 7.5 mg a day, Cozaar 50 mg a day, Zyrtec 10 mg a day, Toprol-XL  125 mg a day, Imdur 30 mg a day, and aspirin 325 mg a day, along with  AcipHex 20 mg a day.   She is a healthy-appearing white female in no distress.  There are no  stigmata of chronic liver disease.  She has had a 10-pound weight gain  up to 184 pounds.  Blood pressure today was 160/100 and pulse was 60 and  regular.  Her abdominal exam did show mild distention but no  organomegaly, masses, or localized tenderness.  Bowel sounds were  normal.  Rectal exam was deferred.   ASSESSMENT:  [name] [name]'s problems sound most consistent with possible occult  bacterial overgrowth syndrome.  I think it is unlikely that she has had  a recurrence of her lymphoma with close followup per Dr. Donnie Coffin.   RECOMMENDATIONS:  1. Trial of Xifaxan 400 mg t.i.d. for 10 days, along with align      probiotic therapy.  2. GI followup in 2 weeks' time.  Will check liver profile before her      visit.  3. Consider followup colonoscopy exam depending on her clinical      course.  4. Other medications as listed above.     Vania Rea. Jarold Motto, MD, [facility] Essex, FAGA  Electronically Signed    DRP/MedQ  DD: 01/24/2007  DT: 01/24/2007  Job #: [id number]   cc:   Arta Silence, MD  Everardo Beals. Juanda Chance, MD, [facility]  Pierce Crane, M.D.

## 2011-04-16 NOTE — Op Note (Signed)
NAME:  [name] [name], [name] [name]                           ACCOUNT NO.:  [id number]   MEDICAL RECORD NO.:  [id number]                   PATIENT TYPE:  OUT   LOCATION:  XRAY                                 FACILITY:  [facility]   PHYSICIAN:  Elana Alm. Thurston Hole, M.D.              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   POSTOPERATIVE DIAGNOSIS:  Left knee medial and lateral meniscal tears with  chondromalacia.   OPERATION PERFORMED:  1. Left knee examination under anesthesia followed by arthroscopic partial     medial and lateral meniscectomies.  2. Left knee chondroplasty with partial synovectomy.   SURGEON:  Elana Alm. Thurston Hole, M.D.   ANESTHESIA:  Local with MAC.   OPERATIVE TIME:  30 minutes.   COMPLICATIONS:  None.   INDICATIONS FOR PROCEDURE:  Ms. Min is a 77 year old woman who injured  her left knee approximately five months ago with a twisting injury,  persistent pain with exam and MRI documenting medial meniscal tear and  chondromalacia, who has failed conservative care and is now to undergo  arthroscopy.   DESCRIPTION OF PROCEDURE:  [name] was brought to the operating room on  December 30, 2003 after a knee block had been placed in the holding room by  anesthesia.  She was placed on the operating table in supine position.  Her  left knee was examined under anesthesia.  Range of motion 0 to 125 degrees,  1 to 2+ crepitation, knee stable to ligamentous exam with normal patellar  tracking.  The knee was prepped using sterile DuraPrep and draped using  sterile technique.  Originally through an anterolateral portal, an  arthroscope with a pump attached was placed and through an anteromedial  portal, an arthroscopic probe was placed.  On initial inspection of the  medial compartment, she was found to have 75%  grade 3 chondromalacia which  was debrided.  Medial meniscus showed tearing of the posterior medial horn  of which 50% was resected back to a stable rim.  The intercondylar notch was  inspected.  The anterior and posterior cruciate ligaments were normal.  Lateral compartment inspected.  30% grade 3 chondromalacia which was  debrided.  Lateral meniscus tearing posterior and lateral horn 20% which was  resected back to a stable rim.  Patellofemoral joint showed 30% grade 3  chondromalacia which was debrided.  The patella tracked normally.  Moderate  synovitis in the medial and lateral gutters were debrided.  Otherwise, they  were free of pathology.  After this was done, it was felt that all pathology  had been satisfactorily addressed.  The instruments were removed.  Portals  closed with 3-0 nylon suture and injected with 0.25% Marcaine with  epinephrine and 4 mg of morphine.  Sterile dressings applied.  Patient  awakened and taken to recovery room in stable condition.   FOLLOW UP:  Ms. Digilio will be followed as an outpatient on Vicodin and  Naprosyn.  See her back in the office in a week for suture removal and  follow-up.                                               Robert A. Thurston Hole, M.D.    RAW/MEDQ  D:  12/30/2003  T:  12/30/2003  Job:  [id number]

## 2011-04-20 ENCOUNTER — Ambulatory Visit (INDEPENDENT_AMBULATORY_CARE_PROVIDER_SITE_OTHER): Payer: Medicare Other | Admitting: Family Medicine

## 2011-04-20 ENCOUNTER — Encounter: Payer: Self-pay | Admitting: Family Medicine

## 2011-04-20 VITALS — BP 140/80 | HR 73 | Temp 98.8°F | Wt 173.0 lb

## 2011-04-20 DIAGNOSIS — J329 Chronic sinusitis, unspecified: Secondary | ICD-10-CM

## 2011-04-20 LAB — HM COLONOSCOPY

## 2011-04-20 MED ORDER — AMOXICILLIN 875 MG PO TABS: 875.0000 mg | ORAL_TABLET | Freq: Two times a day (BID) | ORAL | Status: AC

## 2011-04-20 NOTE — Progress Notes (Signed)
Subjective:     [name] [name] is a 77 y.o. female who presents for evaluation of sinus pain. Symptoms include: congestion, cough, facial pain, fevers and nasal congestion. Onset of symptoms was 7 days ago. Symptoms have been gradually worsening since that time. Past history is significant for no history of pneumonia or bronchitis. Patient is a non-smoker.  The PMH, PSH, Social History, Family History, Medications, and allergies have been reviewed in [facility], and have been updated if relevant.  Review of Systems See HPI Denies CP, nausea, vomiting, diarrhea, abdominal, wheezing, or SOB.  Objective:  BP 140/80  Pulse 73  Temp(Src) 98.8 F (37.1 C) (Oral)  Wt 173 lb (78.472 kg)  SpO2 94% Gen:  Alert, congested, NAD HEENT:  +PND, boggy turbinates, sinuses +/- TTP througout Resp:  CTA bilaterally, no increased WOB CVS: RRR Ext: no edema  Assessment:    Acute bacterial sinusitis.    Plan:    Nasal saline sprays. Amoxicillin per medication orders.

## 2011-04-21 ENCOUNTER — Other Ambulatory Visit: Payer: Self-pay | Admitting: Family Medicine

## 2011-05-06 ENCOUNTER — Telehealth: Payer: Self-pay | Admitting: *Deleted

## 2011-05-06 MED ORDER — FLUCONAZOLE 150 MG PO TABS: 150.0000 mg | ORAL_TABLET | Freq: Once | ORAL | Status: DC

## 2011-05-06 NOTE — Telephone Encounter (Signed)
Pt complains of yeast infection- has burning and itching, and is asking that diflucan be called to [facility].  She has recently been on augmentin.  ( Dr. Dayton Martes has left for the day).

## 2011-05-06 NOTE — Telephone Encounter (Signed)
rx sent to pharmacy by e-script , Spoke with patient and advised results   

## 2011-05-06 NOTE — Telephone Encounter (Signed)
Okay to send Rx for fluconazole 150mg   #2 x 0 Take 1 now and repeat in 3 days if symptoms persist

## 2011-05-31 ENCOUNTER — Other Ambulatory Visit: Payer: Self-pay | Admitting: Family Medicine

## 2011-05-31 ENCOUNTER — Encounter: Payer: Self-pay | Admitting: Family Medicine

## 2011-05-31 ENCOUNTER — Ambulatory Visit (INDEPENDENT_AMBULATORY_CARE_PROVIDER_SITE_OTHER): Payer: Medicare Other | Admitting: Family Medicine

## 2011-05-31 VITALS — BP 124/72 | HR 65 | Temp 98.3°F | Wt 174.5 lb

## 2011-05-31 DIAGNOSIS — Z13820 Encounter for screening for osteoporosis: Secondary | ICD-10-CM

## 2011-05-31 DIAGNOSIS — R7309 Other abnormal glucose: Secondary | ICD-10-CM

## 2011-05-31 DIAGNOSIS — E1122 Type 2 diabetes mellitus with diabetic chronic kidney disease: Secondary | ICD-10-CM | POA: Insufficient documentation

## 2011-05-31 DIAGNOSIS — E119 Type 2 diabetes mellitus without complications: Secondary | ICD-10-CM | POA: Insufficient documentation

## 2011-05-31 DIAGNOSIS — R739 Hyperglycemia, unspecified: Secondary | ICD-10-CM

## 2011-05-31 DIAGNOSIS — N390 Urinary tract infection, site not specified: Secondary | ICD-10-CM

## 2011-05-31 DIAGNOSIS — R81 Glycosuria: Secondary | ICD-10-CM | POA: Insufficient documentation

## 2011-05-31 LAB — POCT URINALYSIS DIPSTICK
Leukocytes, UA: NEGATIVE
Nitrite, UA: NEGATIVE
Protein, UA: NEGATIVE
pH, UA: 5

## 2011-05-31 MED ORDER — FLUCONAZOLE 150 MG PO TABS: 150.0000 mg | ORAL_TABLET | Freq: Once | ORAL | Status: DC

## 2011-05-31 NOTE — Progress Notes (Signed)
SUBJECTIVE: [name] [name] is a 77 y.o. female who complains of urinary frequency without dysuria and increased thirst.  Labia majora irritated and itchy.  No flank pain, fever, chills, or abnormal vaginal discharge or bleeding.   Patient Active Problem List  Diagnoses  . 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  . LYMPHOMA, MALT  . HYPERCHOLESTEROLEMIA  . MIXED HYPERLIPIDEMIA  . ELECTROLYTE AND FLUID DISORDERS NEC  . DETACHED RETINA  . LABILE HYPERTENSION  . 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 CORONARY ARTERY DISEASE  . CAD, NATIVE VESSEL  . MITRAL VALVE PROLAPSE  . EXTERNAL HEMORRHOIDS  . Acute Sinusitis, Unspecified  . GERD  . COLITIS  . DIVERTICULOSIS, COLON  . IBS  . UTI  . BREAST CYSTS, BILATERAL  . URTICARIA, IDIOPATHIC  . UNSPECIFIED DISORDER OF SKIN&SUBCUTANEOUS TISSUE  . ELBOW PAIN, RIGHT  . HIP PAIN, LEFT  . BACK PAIN, THORACIC REGION  . LOW BACK PAIN, CHRONIC  . OSTEOPENIA  . VERTIGO  . OTHER MALAISE AND FATIGUE  . FACIAL PAIN  . EPISTAXIS  . HEMOPTYSIS  . Unspecified Chest Pain  . DIARRHEA  . ABDOMINAL PAIN  . ABNORMAL RESULT, FUNCTION STUDY, LIVER  . SPRAIN/STRAIN, NECK  . LUMBAR SPRAIN AND STRAIN  . CONTUSION, ARM  . ALLERGIC REACTION, ACUTE  . BACK PAIN, ACUTE   Past Medical History  Diagnosis Date  . GERD (gastroesophageal reflux disease)   . CAD (coronary artery disease)   . Urticaria   . Hyperlipidemia   . Hypertension   . Non Hodgkin's lymphoma   . Allergy     Cipro, Plavix, Statins   Past Surgical History  Procedure Date  . Vaginal hysterectomy     partial, fibroids, one ovary left  . Knee surgery 10/2003    left  . Colonoscopy   . Coronary angioplasty with stent placement     x 2   History  Substance Use Topics  . Smoking status: Never Smoker   . Smokeless tobacco: Not on file  . Alcohol Use: No   Family History  Problem Relation Age of Onset  . Cancer Mother     jaw  . Hypertension Father    Allergies    Allergen Reactions  . Celecoxib     REACTION: [place]  . Cephalexin     REACTION: trash and swelling  . Ciprofloxacin     REACTION: u/k  . Clopidogrel Bisulfate     REACTION: swelling, rash  . Codeine Phosphate     REACTION: unspecified  . Ezetimibe-Simvastatin     REACTION: myalgias  . Nitrofurantoin     REACTION: [place]  . Pregabalin     REACTION: felt bad  . Propoxyphene N-Acetaminophen     REACTION: [place]  . Rofecoxib   . Sulfamethoxazole     REACTION: unspecified  . Sulfonamide Derivatives     REACTION: unspecified  . Tussionex Pennkinetic Er     REACTION: u/k   Current Outpatient Prescriptions on File Prior to Visit  Medication Sig Dispense Refill  . aspirin (ASPIRIN LOW DOSE) 81 MG tablet Take 81 mg by mouth daily.        . cetirizine (ZYRTEC) 10 MG tablet Take 10 mg by mouth at bedtime.        . Cranberry-Vitamin C-Vitamin E (CRANBERRY PLUS VITAMIN C PO) Take 1 tablet by mouth daily.        Marland Kitchen ezetimibe (ZETIA) 10 MG tablet Take 10 mg by mouth daily.        Marland Kitchen  fluconazole (DIFLUCAN) 150 MG tablet Take 1 tablet (150 mg total) by mouth once. repeat in 3 days if symptoms persist  2 tablet  0  . ibuprofen (ADVIL,MOTRIN) 200 MG tablet Take 200 mg by mouth as needed.        . loratadine (CLARITIN) 10 MG tablet Take 10 mg by mouth daily.        Marland Kitchen losartan (COZAAR) 50 MG tablet Take 50 mg by mouth daily.        . meclizine (ANTIVERT) 25 MG tablet Take 25 mg by mouth every 8 (eight) hours.        . Meclizine HCl (BONINE) 25 MG CHEW Take as directed       . metoprolol (TOPROL-XL) 100 MG 24 hr tablet Take 100mg  in the morning 50mg  in the evening.       . metoprolol (TOPROL-XL) 50 MG 24 hr tablet Take 50 mg by mouth daily.        . Multiple Vitamins-Minerals (VISION FORMULA) TABS Take 1 tablet by mouth daily.        Marland Kitchen PREVACID 24HR 15 MG capsule TAKE ONE CAPSULE BY MOUTH ONE TIME DAILY  30 capsule  6  . trimethoprim (TRIMPEX) 100 MG tablet Take 100 mg by mouth 2 (two) times daily.           The PMH, PSH, Social History, Family History, Medications, and allergies have been reviewed in [facility], and have been updated if relevant. Multiple drug allergies, including sulfa, cipro, keflex, macrobid although unclear which are true allergies.  OBJECTIVE: BP 124/72  Pulse 65  Temp(Src) 98.3 F (36.8 C) (Oral)  Wt 174 lb 8 oz (79.153 kg)   Appears well, in no apparent distress.  Vital signs are normal. The abdomen is soft without tenderness, guarding, mass, rebound or organomegaly. No CVA tenderness or inguinal adenopathy noted. Urine dipstick shows positive for glucose.  Micro exam: not done.   ASSESSMENT: ?new onset DM.  PLAN: Push fluids, check a1c and BMET today. Diflucan po x 1 for candidiasis. The patient indicates understanding of these issues and agrees with the plan.

## 2011-05-31 NOTE — Progress Notes (Signed)
Addended by: Alvina Chou on: 05/31/2011 03:19 PM   Modules accepted: Orders

## 2011-05-31 NOTE — Patient Instructions (Signed)
Please pick up the diflucan from your pharmacy. I will call you tomorrow with results of your blood sugar test.

## 2011-06-01 ENCOUNTER — Other Ambulatory Visit: Payer: Self-pay | Admitting: Family Medicine

## 2011-06-01 MED ORDER — METFORMIN HCL 500 MG PO TABS: 500.0000 mg | ORAL_TABLET | Freq: Two times a day (BID) | ORAL | Status: DC

## 2011-06-02 LAB — URINE CULTURE: Colony Count: 15000

## 2011-06-04 ENCOUNTER — Other Ambulatory Visit: Payer: Self-pay | Admitting: *Deleted

## 2011-06-04 ENCOUNTER — Other Ambulatory Visit: Payer: Self-pay | Admitting: Family Medicine

## 2011-06-04 ENCOUNTER — Other Ambulatory Visit (INDEPENDENT_AMBULATORY_CARE_PROVIDER_SITE_OTHER): Payer: Medicare Other | Admitting: Family Medicine

## 2011-06-04 DIAGNOSIS — R7309 Other abnormal glucose: Secondary | ICD-10-CM

## 2011-06-04 DIAGNOSIS — N179 Acute kidney failure, unspecified: Secondary | ICD-10-CM

## 2011-06-04 DIAGNOSIS — R739 Hyperglycemia, unspecified: Secondary | ICD-10-CM

## 2011-06-04 DIAGNOSIS — E119 Type 2 diabetes mellitus without complications: Secondary | ICD-10-CM

## 2011-06-04 LAB — BASIC METABOLIC PANEL
BUN: 24 mg/dL — ABNORMAL HIGH (ref 6–23)
CO2: 29 mEq/L (ref 19–32)
Chloride: 96 mEq/L (ref 96–112)
Creatinine, Ser: 1.4 mg/dL — ABNORMAL HIGH (ref 0.4–1.2)
Glucose, Bld: 299 mg/dL — ABNORMAL HIGH (ref 70–99)
Potassium: 5.1 mEq/L (ref 3.5–5.1)

## 2011-06-04 MED ORDER — GLIPIZIDE 5 MG PO TABS: 5.0000 mg | ORAL_TABLET | Freq: Every day | ORAL | Status: DC

## 2011-06-07 ENCOUNTER — Other Ambulatory Visit: Payer: Self-pay | Admitting: Family Medicine

## 2011-06-07 ENCOUNTER — Other Ambulatory Visit (INDEPENDENT_AMBULATORY_CARE_PROVIDER_SITE_OTHER): Payer: Medicare Other | Admitting: Family Medicine

## 2011-06-07 DIAGNOSIS — N179 Acute kidney failure, unspecified: Secondary | ICD-10-CM

## 2011-06-07 DIAGNOSIS — E119 Type 2 diabetes mellitus without complications: Secondary | ICD-10-CM

## 2011-06-07 DIAGNOSIS — N289 Disorder of kidney and ureter, unspecified: Secondary | ICD-10-CM | POA: Insufficient documentation

## 2011-06-07 LAB — BASIC METABOLIC PANEL
BUN: 26 mg/dL — ABNORMAL HIGH (ref 6–23)
CO2: 27 mEq/L (ref 19–32)
Calcium: 9.3 mg/dL (ref 8.4–10.5)
Creatinine, Ser: 1.4 mg/dL — ABNORMAL HIGH (ref 0.4–1.2)
GFR: 37.48 mL/min — ABNORMAL LOW (ref >60.00)
Glucose, Bld: 220 mg/dL — ABNORMAL HIGH (ref 70–99)

## 2011-06-07 NOTE — Progress Notes (Signed)
Shirlee Limerick, can you please look into this for me! Thanks. Nikki, please also remind her not to take any ibuprofen since it's hard on the kidneys.  Ok to take Tylenol.

## 2011-06-08 ENCOUNTER — Encounter: Payer: Self-pay | Admitting: Family Medicine

## 2011-06-17 ENCOUNTER — Other Ambulatory Visit: Payer: Self-pay | Admitting: *Deleted

## 2011-06-17 ENCOUNTER — Other Ambulatory Visit: Payer: Self-pay | Admitting: Family Medicine

## 2011-06-17 ENCOUNTER — Other Ambulatory Visit (INDEPENDENT_AMBULATORY_CARE_PROVIDER_SITE_OTHER): Payer: Medicare Other | Admitting: Family Medicine

## 2011-06-17 ENCOUNTER — Ambulatory Visit: Payer: BC Managed Care – PPO | Admitting: Family Medicine

## 2011-06-17 ENCOUNTER — Ambulatory Visit: Payer: Medicare Other | Admitting: Family Medicine

## 2011-06-17 DIAGNOSIS — E119 Type 2 diabetes mellitus without complications: Secondary | ICD-10-CM

## 2011-06-17 LAB — BASIC METABOLIC PANEL
BUN: 22 mg/dL (ref 6–23)
Calcium: 9.5 mg/dL (ref 8.4–10.5)
Creatinine, Ser: 1.2 mg/dL (ref 0.4–1.2)
GFR: 46.7 mL/min — ABNORMAL LOW (ref >60.00)

## 2011-06-17 LAB — HEMOGLOBIN A1C: Hgb A1c MFr Bld: 11.9 % — ABNORMAL HIGH (ref 4.6–6.5)

## 2011-06-17 MED ORDER — SODIUM POLYSTYRENE SULFONATE 15 GM/60ML PO SUSP: ORAL | Status: DC

## 2011-06-18 ENCOUNTER — Other Ambulatory Visit: Payer: Self-pay | Admitting: Family Medicine

## 2011-06-18 ENCOUNTER — Telehealth: Payer: Self-pay | Admitting: *Deleted

## 2011-06-18 MED ORDER — GLIPIZIDE 5 MG PO TABS: 5.0000 mg | ORAL_TABLET | Freq: Two times a day (BID) | ORAL | Status: DC

## 2011-06-18 NOTE — Telephone Encounter (Signed)
Melons, oranges, dried fruits, bananas and , potatoes, prune juice are very high in potassium.

## 2011-06-18 NOTE — Telephone Encounter (Signed)
Patient says that she needs a list or foods to avoid since her potassium is high. She says that she has already been trying to avoid fruits, bananas. Please advise.

## 2011-06-18 NOTE — Telephone Encounter (Signed)
Patient advised.

## 2011-06-21 ENCOUNTER — Encounter: Payer: Self-pay | Admitting: Family Medicine

## 2011-06-21 ENCOUNTER — Other Ambulatory Visit (INDEPENDENT_AMBULATORY_CARE_PROVIDER_SITE_OTHER): Payer: Medicare Other | Admitting: Family Medicine

## 2011-06-21 DIAGNOSIS — E119 Type 2 diabetes mellitus without complications: Secondary | ICD-10-CM

## 2011-06-21 LAB — BASIC METABOLIC PANEL
BUN: 19 mg/dL (ref 6–23)
CO2: 29 mEq/L (ref 19–32)
Calcium: 9 mg/dL (ref 8.4–10.5)
Chloride: 102 mEq/L (ref 96–112)
Creatinine, Ser: 1.2 mg/dL (ref 0.4–1.2)

## 2011-06-22 ENCOUNTER — Other Ambulatory Visit: Payer: Self-pay | Admitting: *Deleted

## 2011-06-22 MED ORDER — MICROLET LANCETS MISC: Status: DC

## 2011-06-22 MED ORDER — GLUCOSE BLOOD VI STRP: ORAL_STRIP | Status: DC

## 2011-06-23 ENCOUNTER — Telehealth: Payer: Self-pay | Admitting: *Deleted

## 2011-06-23 NOTE — Telephone Encounter (Signed)
Agreed -

## 2011-06-23 NOTE — Telephone Encounter (Signed)
Pt was seen by ENT yesterday for a sinus infection and was given augmentin and prednisone.  She doesn't want to take the prednisone, afraid this will make her blood sugar go up.  Pt states this is not a severe infection that she has had for a long time so I advised pt that she doesn't have to take the prednisone if she doesn't want to.  She said she will discuss the need for the prednisone later if her symptoms dont improve with antibiotic.

## 2011-06-30 ENCOUNTER — Ambulatory Visit: Payer: BC Managed Care – PPO | Admitting: Family Medicine

## 2011-07-20 ENCOUNTER — Ambulatory Visit (INDEPENDENT_AMBULATORY_CARE_PROVIDER_SITE_OTHER): Payer: Medicare Other | Admitting: Family Medicine

## 2011-07-20 ENCOUNTER — Encounter: Payer: Self-pay | Admitting: Family Medicine

## 2011-07-20 ENCOUNTER — Encounter: Payer: Self-pay | Admitting: Cardiovascular Disease

## 2011-07-20 DIAGNOSIS — E119 Type 2 diabetes mellitus without complications: Secondary | ICD-10-CM

## 2011-07-20 DIAGNOSIS — N289 Disorder of kidney and ureter, unspecified: Secondary | ICD-10-CM

## 2011-07-20 LAB — BASIC METABOLIC PANEL
BUN: 24 mg/dL — ABNORMAL HIGH (ref 6–23)
Calcium: 9.4 mg/dL (ref 8.4–10.5)
GFR: 44.94 mL/min — ABNORMAL LOW (ref >60.00)
Glucose, Bld: 145 mg/dL — ABNORMAL HIGH (ref 70–99)

## 2011-07-20 MED ORDER — GLIPIZIDE 5 MG PO TABS: 5.0000 mg | ORAL_TABLET | Freq: Once | ORAL | Status: DC

## 2011-07-20 NOTE — Progress Notes (Signed)
  Subjective:    Patient ID: [name] [name], female    DOB: [birth year], 77 y.o.   MRN: [id number]  HPI  77 yo here for follow up new onset diabetes. Lab Results  Component Value Date   HGBA1C 11.9* 06/17/2011   Has been going to diabetic teaching center. Originally started on Metformin 500 mg twice daily but Cr was elevated at 1.4 so switched to Glipizide 5 mg twice daily . Recently changed to Glipizide 5 mg daily after she had a few episodes of hypoglycemia.  Checking CBGs twice daily. Bring her log in today, ranging 87-138. No recent episodes or symptoms of hypoglycemia.  Feels much better now that her sugars are improving- much more energy, less urinary frequency and less thirst.   Review of Systems See HPI   Objective:   Physical Exam BP 124/70  Pulse 63  Temp(Src) 98.1 F (36.7 C) (Oral)  Wt 163 lb 8 oz (74.163 kg)  General:  Well-developed,well-nourished,in no acute distress; alert,appropriate and cooperative throughout examination Head:  normocephalic and atraumatic.   Eyes:  vision grossly intact, pupils equal, pupils round, and pupils reactive to light.   Ears:  R ear normal and L ear normal.   Nose:  no external deformity.   Mouth:  good dentition.   Lungs:  Normal respiratory effort, chest expands symmetrically. Lungs are clear to auscultation, no crackles or wheezes. Heart:  Normal rate and regular rhythm. S1 and S2 normal without gallop, murmur, click, rub or other extra sounds. Abdomen:  Bowel sounds positive,abdomen soft and non-tender without masses, organomegaly or hernias noted. Msk:  No deformity or scoliosis noted of thoracic or lumbar spine.   Extremities:  No clubbing, cyanosis, edema, or deformity noted with normal full range of motion of all joints.   Neurologic:  alert & oriented X3 and gait normal.   Skin:  Intact without suspicious lesions or rashes Psych:  Cognition and judgment appear intact. Alert and cooperative with normal attention span and  concentration. No apparent delusions, illusions, hallucinations    Assessment & Plan:   1. Diabetes mellitus   Improving.  Doing well. Continue current dose of Glipizide. glipiZIDE (GLUCOTROL) 5 MG tablet, Basic Metabolic Panel (BMET)  2. Renal insufficiency   Improving.  Recheck BMET today. Has appt with nephrology to monitor. Basic Metabolic Panel (BMET)

## 2011-07-26 ENCOUNTER — Telehealth: Payer: Self-pay | Admitting: Physician Assistant

## 2011-07-26 NOTE — Telephone Encounter (Signed)
Pharmacy calling regarding toprol XL 50mg . Please return call.

## 2011-07-28 ENCOUNTER — Other Ambulatory Visit: Payer: Self-pay | Admitting: *Deleted

## 2011-07-28 MED ORDER — METOPROLOL SUCCINATE ER 100 MG PO TB24: ORAL_TABLET | ORAL | Status: DC

## 2011-07-29 ENCOUNTER — Telehealth: Payer: Self-pay | Admitting: Cardiovascular Disease

## 2011-07-29 DIAGNOSIS — I10 Essential (primary) hypertension: Secondary | ICD-10-CM

## 2011-07-29 MED ORDER — METOPROLOL SUCCINATE ER 100 MG PO TB24: ORAL_TABLET | ORAL | Status: DC

## 2011-07-29 MED ORDER — METOPROLOL SUCCINATE ER 50 MG PO TB24: ORAL_TABLET | ORAL | Status: DC

## 2011-07-29 NOTE — Telephone Encounter (Signed)
Toprol has been refilled.

## 2011-07-29 NOTE — Telephone Encounter (Signed)
Pt had a change in her medication and since she has not been seen yet she was wondering why her meds have been changed she will be home until 11am then she has to go to hospital with her sister. She did not pick up meds because she wants to talk to someone first

## 2011-07-29 NOTE — Telephone Encounter (Signed)
Refilled [name] [name]'s Toprol. She is currently taking 100 mg in the morning and 50 mg in the evening.

## 2011-07-31 ENCOUNTER — Ambulatory Visit: Payer: BC Managed Care – PPO | Admitting: Family Medicine

## 2011-08-10 ENCOUNTER — Encounter: Payer: Self-pay | Admitting: Cardiovascular Disease

## 2011-08-10 ENCOUNTER — Ambulatory Visit (INDEPENDENT_AMBULATORY_CARE_PROVIDER_SITE_OTHER): Payer: Medicare Other | Admitting: Cardiovascular Disease

## 2011-08-10 VITALS — BP 135/80 | HR 55 | Ht 69.0 in | Wt 161.1 lb

## 2011-08-10 DIAGNOSIS — I2510 Atherosclerotic heart disease of native coronary artery without angina pectoris: Secondary | ICD-10-CM

## 2011-08-10 NOTE — Progress Notes (Signed)
History of Present Illness:77 yo female with history of CAD, HLD, HTN, non-Hodgkin's lymphoma, DM here today for cardiac follow up. She has been followed in the past by Dr. Juanda Chance.  In 2009 she had tandem nonoverlapping Promus drug-eluting stents placed to the LAD. She was evaluated in 2010 for chest pain and had a negative Myoview scan. She has been allergic to Plavix and is currently taking Ticlid. She has been intolerant to statins in the past.   She says she's been doing quite well has had no recent chest pain,  shortness of breath or palpitations.    Past Medical History  Diagnosis Date  . GERD (gastroesophageal reflux disease)   . CAD (coronary artery disease)   . Urticaria   . Hyperlipidemia   . Hypertension   . Non Hodgkin's lymphoma   . Allergy     Cipro, Plavix, Statins  . Hx of colonoscopy     Past Surgical History  Procedure Date  . Vaginal hysterectomy     partial, fibroids, one ovary left  . Knee surgery 10/2003    left  . Colonoscopy   . Coronary angioplasty with stent placement     x 2  . Dexa-neg   . Cardiac cath-neg   . Laser surgery for cataracts-left   . Stress cardiolite   . Sigmoidoscopy     Current Outpatient Prescriptions  Medication Sig Dispense Refill  . aspirin (ASPIRIN LOW DOSE) 81 MG tablet Take 81 mg by mouth daily.        . cetirizine (ZYRTEC) 10 MG tablet Take 10 mg by mouth at bedtime.        . Cranberry-Vitamin C-Vitamin E (CRANBERRY PLUS VITAMIN C PO) Take 1 tablet by mouth daily.        Marland Kitchen ezetimibe (ZETIA) 10 MG tablet Take 10 mg by mouth daily.        Marland Kitchen glipiZIDE (GLUCOTROL) 5 MG tablet Take 1 tablet (5 mg total) by mouth once.  30 tablet  3  . glucose blood (BAYER CONTOUR TEST) test strip Use to test blood sugar twice daily  100 each  11  . ibuprofen (ADVIL,MOTRIN) 200 MG tablet Take 200 mg by mouth as needed.        . loratadine (CLARITIN) 10 MG tablet Take 10 mg by mouth daily.        Marland Kitchen losartan (COZAAR) 50 MG tablet Take 50 mg by  mouth daily.        . meclizine (ANTIVERT) 25 MG tablet Take 25 mg by mouth every 8 (eight) hours.        . Meclizine HCl (BONINE) 25 MG CHEW Take as directed       . metoprolol (TOPROL-XL) 100 MG 24 hr tablet Take one tablet by mouth in the morning.  30 tablet  4  . metoprolol (TOPROL-XL) 50 MG 24 hr tablet Take one tablet by mouth in the evening.  30 tablet  4  . MICROLET LANCETS MISC Use with Bayer Contour Meter Test blood sugar twice daily  100 each  11  . Multiple Vitamins-Minerals (VISION FORMULA) TABS Take 1 tablet by mouth daily.        Marland Kitchen PREVACID 24HR 15 MG capsule TAKE ONE CAPSULE BY MOUTH ONE TIME DAILY  30 capsule  6  . sodium polystyrene (KAYEXALATE) 15 GM/60ML suspension 15g by mouth times one now, one refill  60 mL  1  . trimethoprim (TRIMPEX) 100 MG tablet Take 100 mg by mouth 2 (two)  times daily.          Allergies  Allergen Reactions  . Celecoxib     REACTION: [place]  . Cephalexin     REACTION: trash and swelling  . Ciprofloxacin     REACTION: u/k  . Clopidogrel Bisulfate     REACTION: swelling, rash  . Codeine Phosphate     REACTION: unspecified  . Ezetimibe-Simvastatin     REACTION: myalgias  . Nitrofurantoin     REACTION: [place]  . Pregabalin     REACTION: felt bad  . Propoxyphene N-Acetaminophen     REACTION: [place]  . Rofecoxib   . Sulfamethoxazole     REACTION: unspecified  . Sulfonamide Derivatives     REACTION: unspecified  . Tussionex Pennkinetic Er     REACTION: u/k    History   Social History  . Marital Status: Married    Spouse Name: N/A    Number of Children: N/A  . Years of Education: N/A   Occupational History  . Retired    Social History Main Topics  . Smoking status: Never Smoker   . Smokeless tobacco: Not on file  . Alcohol Use: No  . Drug Use: No  . Sexually Active: Not on file   Other Topics Concern  . Not on file   Social History Narrative  . No narrative on file    Family History  Problem Relation Age of Onset  . Cancer  Mother     jaw  . Hypertension Father   . Colon cancer Neg Hx     Review of Systems:  As stated in the HPI and otherwise negative.   BP 152/84  Ht 5\' 9"  (1.753 m)  Wt 161 lb 1.9 oz (73.084 kg)  BMI 23.79 kg/m2  Physical Examination: General: Well developed, well nourished, NAD HEENT: OP clear, mucus membranes moist SKIN: warm, dry. No rashes. Neuro: No focal deficits Musculoskeletal: Muscle strength 5/5 all ext Psychiatric: Mood and affect normal Neck: No JVD, no carotid bruits, no thyromegaly, no lymphadenopathy. Lungs:Clear bilaterally, no wheezes, rhonci, crackles Cardiovascular: Regular rate and rhythm. No murmurs, gallops or rubs. Abdomen:Soft. Bowel sounds present. Non-tender.  Extremities: No lower extremity edema. Pulses are 2 + in the bilateral DP/PT.  ZOX:WRUEA bradycardia, rate 55 bpm.

## 2011-08-10 NOTE — Assessment & Plan Note (Signed)
Stable. NO chest pain. Continue Zetia, ASA, Toprol. She is intolerant to statins. No changes.

## 2011-08-10 NOTE — Patient Instructions (Signed)
Your physician wants you to follow-up in:  12 months.  You will receive a reminder letter in the mail two months in advance. If you don't receive a letter, please call our office to schedule the follow-up appointment.   

## 2011-08-24 LAB — URINALYSIS, ROUTINE W REFLEX MICROSCOPIC
Glucose, UA: NEGATIVE
Ketones, ur: NEGATIVE
Protein, ur: NEGATIVE
Urobilinogen, UA: 0.2

## 2011-08-24 LAB — BASIC METABOLIC PANEL
BUN: 14
CO2: 26
CO2: 30
Calcium: 9.3
Chloride: 100
Chloride: 103
Creatinine, Ser: 1.11
Creatinine, Ser: 1.18
Creatinine, Ser: 1.25 — ABNORMAL HIGH
GFR calc Af Amer: 51 — ABNORMAL LOW
GFR calc Af Amer: 54 — ABNORMAL LOW
GFR calc non Af Amer: 45 — ABNORMAL LOW
Glucose, Bld: 121 — ABNORMAL HIGH
Glucose, Bld: 121 — ABNORMAL HIGH
Potassium: 4.3
Sodium: 140

## 2011-08-24 LAB — CBC
Hemoglobin: 12.9
Hemoglobin: 13
MCHC: 34.3
MCHC: 34.4
MCV: 98
RBC: 3.83 — ABNORMAL LOW
RBC: 3.9
RDW: 12.8
WBC: 6.6

## 2011-09-14 ENCOUNTER — Ambulatory Visit (INDEPENDENT_AMBULATORY_CARE_PROVIDER_SITE_OTHER): Payer: Medicare Other

## 2011-09-14 DIAGNOSIS — Z23 Encounter for immunization: Secondary | ICD-10-CM

## 2011-09-20 ENCOUNTER — Encounter: Payer: Self-pay | Admitting: Family Medicine

## 2011-09-22 ENCOUNTER — Encounter: Payer: Self-pay | Admitting: Family Medicine

## 2011-09-23 ENCOUNTER — Encounter: Payer: Self-pay | Admitting: Family Medicine

## 2011-09-23 ENCOUNTER — Ambulatory Visit (INDEPENDENT_AMBULATORY_CARE_PROVIDER_SITE_OTHER): Payer: Medicare Other | Admitting: Family Medicine

## 2011-09-23 DIAGNOSIS — N289 Disorder of kidney and ureter, unspecified: Secondary | ICD-10-CM

## 2011-09-23 DIAGNOSIS — E119 Type 2 diabetes mellitus without complications: Secondary | ICD-10-CM

## 2011-09-23 LAB — HEMOGLOBIN A1C: Hgb A1c MFr Bld: 6 % (ref 4.6–6.5)

## 2011-09-23 NOTE — Patient Instructions (Signed)
Great to see you. We will call with your a1c results. Good luck with your biopsy next week.

## 2011-09-23 NOTE — Progress Notes (Signed)
  Subjective:    Patient ID: [name] [name], female    DOB: 11-28-1932, 77 y.o.   MRN: [id number]  HPI  77 yo here for follow up new onset diabetes. Lab Results  Component Value Date   HGBA1C 11.9* 06/17/2011   Has been going to diabetic teaching center. Originally started on Metformin 500 mg twice daily but Cr was elevated at 1.4 so switched to Glipizide 5 mg twice daily . Lab Results  Component Value Date   CREATININE 1.2 07/20/2011  Referred to renal- renal U/S and 24 hour urine pending.  Recently changed to Glipizide 5 mg daily after she had a few episodes of hypoglycemia. Checks CBGs twice daily, doing very well with her diabetic diet. CBGs ranging 80s- low 100s. Has lost 3 pounds since last office visit!  Breast biopsy next week.  She is not too nervous since she was told it is very small.   Feels much better now that her sugars are improving- much more energy, less urinary frequency and less thirst.   Review of Systems See HPI   Objective:   Physical Exam BP 130/70  Pulse 59  Temp(Src) 98 F (36.7 C) (Oral)  Wt 160 lb (72.576 kg)  General:  Well-developed,well-nourished,in no acute distress; alert,appropriate and cooperative throughout examination Head:  normocephalic and atraumatic.   Eyes:  vision grossly intact, pupils equal, pupils round, and pupils reactive to light.   Ears:  R ear normal and L ear normal.   Nose:  no external deformity.   Mouth:  good dentition.   Lungs:  Normal respiratory effort, chest expands symmetrically. Lungs are clear to auscultation, no crackles or wheezes. Heart:  Normal rate and regular rhythm. S1 and S2 normal without gallop, murmur, click, rub or other extra sounds. Abdomen:  Bowel sounds positive,abdomen soft and non-tender without masses, organomegaly or hernias noted. Msk:  No deformity or scoliosis noted of thoracic or lumbar spine.   Extremities:  No clubbing, cyanosis, edema, or deformity noted with normal full range of motion  of all joints.   Neurologic:  alert & oriented X3 and gait normal.   Skin:  Intact without suspicious lesions or rashes Psych:  Cognition and judgment appear intact. Alert and cooperative with normal attention span and concentration. No apparent delusions, illusions, hallucinations    Assessment & Plan:   1. Diabetes mellitus   Improving.  Doing well. Continue current dose of Glipizide. glipiZIDE (GLUCOTROL) 5 MG tablet,  a1c   2. Renal insufficiency     Improved.  Followed by nephrology, renal ultrasound pending.

## 2011-09-28 ENCOUNTER — Encounter: Payer: Self-pay | Admitting: Family Medicine

## 2011-09-28 ENCOUNTER — Other Ambulatory Visit: Payer: Self-pay | Admitting: Radiology

## 2011-09-29 ENCOUNTER — Encounter: Payer: Self-pay | Admitting: Family Medicine

## 2011-09-30 ENCOUNTER — Encounter: Payer: Self-pay | Admitting: Family Medicine

## 2011-10-04 ENCOUNTER — Other Ambulatory Visit: Payer: Self-pay | Admitting: *Deleted

## 2011-10-04 MED ORDER — LOSARTAN POTASSIUM 50 MG PO TABS: 50.0000 mg | ORAL_TABLET | Freq: Every day | ORAL | Status: DC

## 2011-11-19 ENCOUNTER — Other Ambulatory Visit (HOSPITAL_COMMUNITY): Payer: Self-pay | Admitting: Nephrology

## 2011-11-19 ENCOUNTER — Telehealth: Payer: Self-pay | Admitting: Family Medicine

## 2011-11-19 DIAGNOSIS — N329 Bladder disorder, unspecified: Secondary | ICD-10-CM

## 2011-11-19 NOTE — Telephone Encounter (Signed)
Spoke with patient and she will be having a CT scan done of her right kidney ordered by [facility] and she will need BUN/Cr labs done to be sent to Dr. Hyman Hopes at [facility]  Schedule lab appt for 11/22/2011.

## 2011-11-19 NOTE — Telephone Encounter (Signed)
Would like to speak with nurse re: CT scan needed at [facility] in January and info needed for it.  Please call back at [phone].

## 2011-11-22 ENCOUNTER — Other Ambulatory Visit (INDEPENDENT_AMBULATORY_CARE_PROVIDER_SITE_OTHER): Payer: Medicare Other

## 2011-11-22 DIAGNOSIS — Z0000 Encounter for general adult medical examination without abnormal findings: Secondary | ICD-10-CM

## 2011-11-22 LAB — CREATININE, SERUM: Creatinine, Ser: 1.3 mg/dL — ABNORMAL HIGH (ref 0.4–1.2)

## 2011-11-22 LAB — BUN: BUN: 17 mg/dL (ref 6–23)

## 2011-11-25 ENCOUNTER — Telehealth: Payer: Self-pay | Admitting: Cardiovascular Disease

## 2011-11-25 MED ORDER — EZETIMIBE 10 MG PO TABS: 10.0000 mg | ORAL_TABLET | Freq: Every day | ORAL | Status: DC

## 2011-11-25 NOTE — Telephone Encounter (Signed)
New Msg: pt calling to check on status of zetia rx refill request from [facility]. Pt said there were no more refills on RX therefore [facility] has request approval of rx refill. Please call this refill request in.

## 2011-11-25 NOTE — Telephone Encounter (Signed)
Patient called, states she needs refills sent in to [facility] for Zetia.Zetia 10 mg daily #90 ,3 refills sent to [facility].

## 2011-12-06 ENCOUNTER — Encounter (HOSPITAL_COMMUNITY): Payer: Self-pay

## 2011-12-06 ENCOUNTER — Ambulatory Visit (HOSPITAL_COMMUNITY)
Admission: RE | Admit: 2011-12-06 | Discharge: 2011-12-06 | Disposition: A | Discharge status: Home / Self Care | Payer: Medicare Other | Source: Ambulatory Visit | Attending: Nephrology | Admitting: Nephrology

## 2011-12-06 ENCOUNTER — Other Ambulatory Visit (HOSPITAL_COMMUNITY): Payer: Self-pay | Admitting: Nephrology

## 2011-12-06 DIAGNOSIS — N329 Bladder disorder, unspecified: Secondary | ICD-10-CM

## 2011-12-06 DIAGNOSIS — E119 Type 2 diabetes mellitus without complications: Secondary | ICD-10-CM | POA: Insufficient documentation

## 2011-12-06 DIAGNOSIS — N281 Cyst of kidney, acquired: Secondary | ICD-10-CM | POA: Insufficient documentation

## 2011-12-06 DIAGNOSIS — I700 Atherosclerosis of aorta: Secondary | ICD-10-CM | POA: Insufficient documentation

## 2011-12-06 DIAGNOSIS — I2510 Atherosclerotic heart disease of native coronary artery without angina pectoris: Secondary | ICD-10-CM | POA: Insufficient documentation

## 2011-12-06 DIAGNOSIS — R944 Abnormal results of kidney function studies: Secondary | ICD-10-CM | POA: Insufficient documentation

## 2011-12-06 DIAGNOSIS — K5730 Diverticulosis of large intestine without perforation or abscess without bleeding: Secondary | ICD-10-CM | POA: Insufficient documentation

## 2011-12-06 HISTORY — DX: Malignant (primary) neoplasm, unspecified: C80.1

## 2011-12-06 MED ORDER — IOHEXOL 300 MG/ML  SOLN
100.0000 mL | Freq: Once | INTRAMUSCULAR | Status: AC | PRN
  Administered 2011-12-06: 100 mL via INTRAVENOUS

## 2011-12-09 ENCOUNTER — Encounter: Payer: Self-pay | Admitting: *Deleted

## 2011-12-10 ENCOUNTER — Encounter: Payer: Self-pay | Admitting: Gastroenterology

## 2011-12-10 ENCOUNTER — Ambulatory Visit (INDEPENDENT_AMBULATORY_CARE_PROVIDER_SITE_OTHER): Payer: Medicare Other | Admitting: Gastroenterology

## 2011-12-10 VITALS — BP 132/68 | HR 60 | Ht 69.0 in | Wt 163.0 lb

## 2011-12-10 DIAGNOSIS — K644 Residual hemorrhoidal skin tags: Secondary | ICD-10-CM | POA: Insufficient documentation

## 2011-12-10 DIAGNOSIS — K625 Hemorrhage of anus and rectum: Secondary | ICD-10-CM

## 2011-12-10 MED ORDER — HYDROCORTISONE ACE-PRAMOXINE 1-1 % RE CREA: TOPICAL_CREAM | Freq: Two times a day (BID) | RECTAL | Status: AC

## 2011-12-10 NOTE — Patient Instructions (Signed)
Buy metamucil OTC and take once a day. Use Analpram as needed, rx sent to your pharmacy.

## 2011-12-10 NOTE — Progress Notes (Signed)
This is a somewhat complicated 78 year old Caucasian female 10 years status post chemotherapy radiation treatment for intestinal non-Hodgkin's lymphoma. She is followed by Dr. Pierce Crane. She has other multiple medical problems including new-onset diabetes, and she is on oral medications. Recent CT scan of the abdomen was unremarkable scattered left colon diverticulosis, and a lateral renal cysts, and she does have mild chronic renal insufficiency. She has had several colonoscopies which have been unremarkable except for some small telangiectasias in her rectum felt secondary to previous radiation therapy.. She had bright red blood per rectum several days ago without rectal or abdominal pain. She denies upper gastrointestinal or hepatobiliary complaints. She does take aspirin 81 mg a day and when necessary Advil.  Current Medications, Allergies, Past Medical History, Past Surgical History, Family History and Social History were reviewed in [employer] record.  Pertinent Review of Systems Negative   Physical Exam: Healthy-appearing patient in no distress. Abdominal exam shows no organomegaly, masses or tenderness. Bowel sounds are normal. Inspection of rectum shows a posterior external hemorrhoid with a small blood clot evident. Rectal exam shows no masses or tenderness.    Assessment and Plan: Hemorrhoidal bleeding and the patient had a negative colonoscopy in April of 2010. Random colon biopsies at that time were negative for lymphocytic or collagenous colitis. I have asked to take daily Metamucil with liberal by mouth fluids, and to use Analpram cream on a when necessary . For rectal bleeding continues, she will call, and we will proceed with followup colonoscopy. Otherwise the rash continue her multiple medications as listed and reviewed.  Please copy her primary care physician, referring physician, and pertinent subspecialists. No diagnosis found.

## 2011-12-14 ENCOUNTER — Other Ambulatory Visit: Payer: Self-pay | Admitting: Family Medicine

## 2012-01-25 ENCOUNTER — Other Ambulatory Visit: Payer: Self-pay | Admitting: *Deleted

## 2012-01-25 DIAGNOSIS — I10 Essential (primary) hypertension: Secondary | ICD-10-CM

## 2012-01-25 MED ORDER — METOPROLOL SUCCINATE ER 50 MG PO TB24: ORAL_TABLET | ORAL | Status: DC

## 2012-01-25 MED ORDER — METOPROLOL SUCCINATE ER 100 MG PO TB24: ORAL_TABLET | ORAL | Status: DC

## 2012-02-15 ENCOUNTER — Ambulatory Visit (INDEPENDENT_AMBULATORY_CARE_PROVIDER_SITE_OTHER): Payer: Medicare Other | Admitting: Family Medicine

## 2012-02-15 ENCOUNTER — Encounter: Payer: Self-pay | Admitting: Family Medicine

## 2012-02-15 ENCOUNTER — Other Ambulatory Visit: Payer: Self-pay | Admitting: Family Medicine

## 2012-02-15 DIAGNOSIS — E119 Type 2 diabetes mellitus without complications: Secondary | ICD-10-CM

## 2012-02-15 LAB — HEMOGLOBIN A1C: Hgb A1c MFr Bld: 5.9 % (ref 4.6–6.5)

## 2012-02-15 NOTE — Telephone Encounter (Signed)
Received refill electronically from pharmacy. Is it okay to refill medication. See allergy/contraindication.

## 2012-02-15 NOTE — Patient Instructions (Signed)
I'm going to call you later today or tomorrow with your a1c result. Continue your glipizide once daily.

## 2012-02-15 NOTE — Progress Notes (Signed)
Subjective:    Patient ID: [name] [name], female    DOB: July 20, 1932, 78 y.o.   MRN: [id number]  HPI  78 yo here for follow up diabetes. Lab Results  Component Value Date   HGBA1C 6.0 09/23/2011    On Glipizide 5 mg daily. Checks CBGs twice daily, doing very well with her diabetic diet. CBGs ranging 80s- low 102. Denies any episodes of hypoglycemia.  Patient Active Problem List  Diagnoses  . 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  . LYMPHOMA, MALT  . HYPERCHOLESTEROLEMIA  . MIXED HYPERLIPIDEMIA  . ELECTROLYTE AND FLUID DISORDERS NEC  . DETACHED RETINA  . LABILE HYPERTENSION  . 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 CORONARY ARTERY DISEASE  . CAD, NATIVE VESSEL  . MITRAL VALVE PROLAPSE  . EXTERNAL HEMORRHOIDS  . Acute Sinusitis, Unspecified  . GERD  . COLITIS  . DIVERTICULOSIS, COLON  . IBS  . UTI  . BREAST CYSTS, BILATERAL  . URTICARIA, IDIOPATHIC  . UNSPECIFIED DISORDER OF SKIN&SUBCUTANEOUS TISSUE  . ELBOW PAIN, RIGHT  . HIP PAIN, LEFT  . BACK PAIN, THORACIC REGION  . LOW BACK PAIN, CHRONIC  . OSTEOPENIA  . VERTIGO  . OTHER MALAISE AND FATIGUE  . FACIAL PAIN  . EPISTAXIS  . HEMOPTYSIS  . Unspecified Chest Pain  . DIARRHEA  . ABDOMINAL PAIN  . ABNORMAL RESULT, FUNCTION STUDY, LIVER  . SPRAIN/STRAIN, NECK  . LUMBAR SPRAIN AND STRAIN  . CONTUSION, ARM  . ALLERGIC REACTION, ACUTE  . BACK PAIN, ACUTE  . Glucosuria  . Diabetes mellitus  . Renal insufficiency  . BRBPR (bright red blood per rectum)  . External hemorrhoid, bleeding   Past Medical History  Diagnosis Date  . GERD (gastroesophageal reflux disease)   . CAD (coronary artery disease)   . Urticaria   . Hyperlipidemia   . Hypertension   . Non Hodgkin's lymphoma   . Allergy     Cipro, Plavix, Statins  . Personal history of colonic polyps   . Diabetes mellitus     Diagnosed 2012  . nhl dx'd 2003    chemo/xrt comp 2003  . Proctitis    Past Surgical History  Procedure Date    . Vaginal hysterectomy     partial, fibroids, one ovary left  . Knee surgery 10/2003    left  . Coronary angioplasty with stent placement     x 2  . Dexa-neg   . Cardiac cath-neg   . Laser surgery for cataracts-left   . Stress cardiolite    History  Substance Use Topics  . Smoking status: Never Smoker   . Smokeless tobacco: Never Used  . Alcohol Use: No   Family History  Problem Relation Age of Onset  . Cancer Mother     jaw  . Hypertension Father   . Heart attack Father   . Colon cancer Neg Hx   . Heart attack Sister    Allergies  Allergen Reactions  . Celecoxib     REACTION: [place]  . Cephalexin     REACTION: trash and swelling  . Ciprofloxacin     REACTION: u/k  . Clopidogrel Bisulfate     REACTION: swelling, rash  . Codeine Phosphate     REACTION: unspecified  . Ezetimibe-Simvastatin     REACTION: myalgias  . Nitrofurantoin     REACTION: [place]  . Pregabalin     REACTION: felt bad  . Propoxyphene N-Acetaminophen     REACTION: [place]  .  Rofecoxib   . Shellfish Allergy   . Sulfamethoxazole     REACTION: unspecified  . Sulfonamide Derivatives     REACTION: unspecified  . Tussionex Pennkinetic Er     REACTION: u/k   Current Outpatient Prescriptions on File Prior to Visit  Medication Sig Dispense Refill  . aspirin (ASPIRIN LOW DOSE) 81 MG tablet Take 81 mg by mouth daily.        . Calcium Carbonate-Vitamin D (CALCIUM-VITAMIN D) 500-200 MG-UNIT per tablet Take 1 tablet by mouth 2 (two) times daily with a meal.        . cetirizine (ZYRTEC) 10 MG tablet Take 10 mg by mouth as needed.       . Cranberry-Vitamin C-Vitamin E (CRANBERRY PLUS VITAMIN C PO) Take 1 tablet by mouth daily.       Marland Kitchen ezetimibe (ZETIA) 10 MG tablet Take 1 tablet (10 mg total) by mouth daily.  90 tablet  3  . glipiZIDE (GLUCOTROL) 5 MG tablet Take 1 tablet (5 mg total) by mouth once.  30 tablet  3  . glucose blood (BAYER CONTOUR TEST) test strip Use to test blood sugar twice daily  100 each  11  .  ibuprofen (ADVIL,MOTRIN) 200 MG tablet Take 200 mg by mouth as needed.        Marland Kitchen losartan (COZAAR) 50 MG tablet Take 1 tablet (50 mg total) by mouth daily.  30 tablet  6  . meclizine (ANTIVERT) 25 MG tablet Take 25 mg by mouth every 8 (eight) hours.        . metoprolol succinate (TOPROL-XL) 100 MG 24 hr tablet Take one tablet by mouth in the morning.  30 tablet  6  . metoprolol succinate (TOPROL-XL) 50 MG 24 hr tablet Take one tablet by mouth in the evening.  30 tablet  6  . MICROLET LANCETS MISC Use with Bayer Contour Meter Test blood sugar twice daily  100 each  11  . Multiple Vitamins-Minerals (VISION FORMULA) TABS Take 1 tablet by mouth daily.        Marland Kitchen PREVACID 24HR 15 MG capsule TAKE ONE CAPSULE BY MOUTH ONE TIME DAILY  30 capsule  6   The PMH, PSH, Social History, Family History, Medications, and allergies have been reviewed in [facility], and have been updated if relevant.    Review of Systems See HPI   Objective:   Physical Exam BP 120/72  Pulse 58  Temp(Src) 97.9 F (36.6 C) (Oral)  Ht 5\' 9"  (1.753 m)  Wt 163 lb (73.936 kg)  BMI 24.07 kg/m2  SpO2 98%  General:  Well-developed,well-nourished,in no acute distress; alert,appropriate and cooperative throughout examination Head:  normocephalic and atraumatic.   Eyes:  vision grossly intact, pupils equal, pupils round, and pupils reactive to light.   Ears:  R ear normal and L ear normal.   Nose:  no external deformity.   Mouth:  good dentition.   Lungs:  Normal respiratory effort, chest expands symmetrically. Lungs are clear to auscultation, no crackles or wheezes. Heart:  Normal rate and regular rhythm. S1 and S2 normal without gallop, murmur, click, rub or other extra sounds. Abdomen:  Bowel sounds positive,abdomen soft and non-tender without masses, organomegaly or hernias noted. Msk:  No deformity or scoliosis noted of thoracic or lumbar spine.   Extremities:  No clubbing, cyanosis, edema, or deformity noted with normal full range  of motion of all joints.   Neurologic:  alert & oriented X3 and gait normal.   Skin:  Intact without suspicious lesions or rashes Psych:  Cognition and judgment appear intact. Alert and cooperative with normal attention span and concentration. No apparent delusions, illusions, hallucinations    Assessment & Plan:   1. Diabetes mellitus   Improving.  Doing well. Continue current dose of Glipizide. Recheck a1c today. glipiZIDE (GLUCOTROL) 5 MG tablet,  a1c

## 2012-03-16 ENCOUNTER — Ambulatory Visit (INDEPENDENT_AMBULATORY_CARE_PROVIDER_SITE_OTHER): Payer: Medicare Other | Admitting: Family Medicine

## 2012-03-16 ENCOUNTER — Encounter: Payer: Self-pay | Admitting: Family Medicine

## 2012-03-16 VITALS — BP 130/72 | HR 60 | Temp 97.9°F | Wt 165.0 lb

## 2012-03-16 DIAGNOSIS — M79606 Pain in leg, unspecified: Secondary | ICD-10-CM

## 2012-03-16 DIAGNOSIS — M79609 Pain in unspecified limb: Secondary | ICD-10-CM

## 2012-03-16 MED ORDER — TRAMADOL HCL 50 MG PO TABS: 50.0000 mg | ORAL_TABLET | Freq: Three times a day (TID) | ORAL | Status: AC | PRN

## 2012-03-16 NOTE — Progress Notes (Signed)
Subjective:    Patient ID: [name] [name], female    DOB: 10-Sep-1932, 78 y.o.   MRN: [id number]  HPI  78 yo here for left leg pain for several weeks.  Had right foot surgery 8 weeks ago.  Several weeks after surgery, she developed right thigh/groin pain. Pain is worse when she stands or knees are together. Has chronic "tingling in feet, " denies any new radiculopathy.  No LE weakness.  Patient Active Problem List  Diagnoses  . 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  . LYMPHOMA, MALT  . HYPERCHOLESTEROLEMIA  . MIXED HYPERLIPIDEMIA  . ELECTROLYTE AND FLUID DISORDERS NEC  . DETACHED RETINA  . LABILE HYPERTENSION  . 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 CORONARY ARTERY DISEASE  . CAD, NATIVE VESSEL  . MITRAL VALVE PROLAPSE  . EXTERNAL HEMORRHOIDS  . Acute Sinusitis, Unspecified  . GERD  . COLITIS  . DIVERTICULOSIS, COLON  . IBS  . UTI  . BREAST CYSTS, BILATERAL  . URTICARIA, IDIOPATHIC  . UNSPECIFIED DISORDER OF SKIN&SUBCUTANEOUS TISSUE  . ELBOW PAIN, RIGHT  . HIP PAIN, LEFT  . BACK PAIN, THORACIC REGION  . LOW BACK PAIN, CHRONIC  . OSTEOPENIA  . VERTIGO  . OTHER MALAISE AND FATIGUE  . FACIAL PAIN  . EPISTAXIS  . HEMOPTYSIS  . Unspecified Chest Pain  . DIARRHEA  . ABDOMINAL PAIN  . ABNORMAL RESULT, FUNCTION STUDY, LIVER  . SPRAIN/STRAIN, NECK  . LUMBAR SPRAIN AND STRAIN  . CONTUSION, ARM  . ALLERGIC REACTION, ACUTE  . BACK PAIN, ACUTE  . Glucosuria  . Diabetes mellitus  . Renal insufficiency  . BRBPR (bright red blood per rectum)  . External hemorrhoid, bleeding   Past Medical History  Diagnosis Date  . GERD (gastroesophageal reflux disease)   . CAD (coronary artery disease)   . Urticaria   . Hyperlipidemia   . Hypertension   . Non Hodgkin's lymphoma   . Allergy     Cipro, Plavix, Statins  . Personal history of colonic polyps   . Diabetes mellitus     Diagnosed 2012  . nhl dx'd 2003    chemo/xrt comp 2003  . Proctitis    Past  Surgical History  Procedure Date  . Vaginal hysterectomy     partial, fibroids, one ovary left  . Knee surgery 10/2003    left  . Coronary angioplasty with stent placement     x 2  . Dexa-neg   . Cardiac cath-neg   . Laser surgery for cataracts-left   . Stress cardiolite    History  Substance Use Topics  . Smoking status: Never Smoker   . Smokeless tobacco: Never Used  . Alcohol Use: No   Family History  Problem Relation Age of Onset  . Cancer Mother     jaw  . Hypertension Father   . Heart attack Father   . Colon cancer Neg Hx   . Heart attack Sister    Allergies  Allergen Reactions  . Celecoxib     REACTION: [place]  . Cephalexin     REACTION: trash and swelling  . Ciprofloxacin     REACTION: u/k  . Clopidogrel Bisulfate     REACTION: swelling, rash  . Codeine Phosphate     REACTION: unspecified  . Ezetimibe-Simvastatin     REACTION: myalgias  . Nitrofurantoin     REACTION: [place]  . Pregabalin     REACTION: felt bad  . Propoxacet-N  REACTION: [place]  . Rofecoxib   . Shellfish Allergy   . Sulfamethoxazole     REACTION: unspecified  . Sulfonamide Derivatives     REACTION: unspecified  . Tussionex Pennkinetic Er     REACTION: u/k   Current Outpatient Prescriptions on File Prior to Visit  Medication Sig Dispense Refill  . aspirin (ASPIRIN LOW DOSE) 81 MG tablet Take 81 mg by mouth daily.        . Calcium Carbonate-Vitamin D (CALCIUM-VITAMIN D) 500-200 MG-UNIT per tablet Take 1 tablet by mouth 2 (two) times daily with a meal.        . cetirizine (ZYRTEC) 10 MG tablet Take 10 mg by mouth as needed.       . Cranberry-Vitamin C-Vitamin E (CRANBERRY PLUS VITAMIN C PO) Take 1 tablet by mouth daily.       Marland Kitchen ezetimibe (ZETIA) 10 MG tablet Take 1 tablet (10 mg total) by mouth daily.  90 tablet  3  . glipiZIDE (GLUCOTROL) 5 MG tablet Take 1 tablet (5 mg total) by mouth once.  30 tablet  3  . glucose blood (BAYER CONTOUR TEST) test strip Use to test blood sugar twice daily   100 each  11  . ibuprofen (ADVIL,MOTRIN) 200 MG tablet Take 200 mg by mouth as needed.        Marland Kitchen losartan (COZAAR) 50 MG tablet Take 1 tablet (50 mg total) by mouth daily.  30 tablet  6  . meclizine (ANTIVERT) 25 MG tablet Take 25 mg by mouth every 8 (eight) hours.        . metoprolol succinate (TOPROL-XL) 100 MG 24 hr tablet Take one tablet by mouth in the morning.  30 tablet  6  . metoprolol succinate (TOPROL-XL) 50 MG 24 hr tablet Take one tablet by mouth in the evening.  30 tablet  6  . MICROLET LANCETS MISC Use with Bayer Contour Meter Test blood sugar twice daily  100 each  11  . Multiple Vitamins-Minerals (VISION FORMULA) TABS Take 1 tablet by mouth daily.        Marland Kitchen PREVACID 24HR 15 MG capsule TAKE ONE CAPSULE BY MOUTH ONE TIME DAILY  30 capsule  5  . DISCONTD: loratadine (CLARITIN) 10 MG tablet Take 10 mg by mouth as needed.        The PMH, PSH, Social History, Family History, Medications, and allergies have been reviewed in [facility], and have been updated if relevant.    Review of Systems See HPI   Objective:   Physical Exam BP 130/72  Pulse 60  Temp(Src) 97.9 F (36.6 C) (Oral)  Wt 165 lb (74.844 kg)  General:  Well-developed,well-nourished,in no acute distress; alert,appropriate and cooperative throughout examination Head:  normocephalic and atraumatic.   Eyes:  vision grossly intact, pupils equal, pupils round, and pupils reactive to light.   Ears:  R ear normal and L ear normal.   Nose:  no external deformity.   Mouth:  good dentition.   Lungs:  Normal respiratory effort, chest expands symmetrically. Lungs are clear to auscultation, no crackles or wheezes. Heart:  Normal rate and regular rhythm. S1 and S2 normal without gallop, murmur, click, rub or other extra sounds. Abdomen:  Bowel sounds positive,abdomen soft and non-tender without masses, organomegaly or hernias noted. Msk:  No deformity or scoliosis noted of thoracic or lumbar spine.   Extremities:  FROM of left hip, no  TTP over trochanteric bursa Neurologic:  alert & oriented X3 and gait normal.  Skin:  Intact without suspicious lesions or rashes Psych:  Cognition and judgment appear intact. Alert and cooperative with normal attention span and concentration. No apparent delusions, illusions, hallucinations    Assessment & Plan:   1. Groin strain New- likely due to compensation from foot surgery. Given handout from sports med advisor- discussed exercises.  Tramadol and Tylenol as needed for pain.

## 2012-03-16 NOTE — Patient Instructions (Signed)
Take tramadol as needed. Try applying heat. Keep walking Do home exercises we discussed.

## 2012-03-24 ENCOUNTER — Ambulatory Visit (INDEPENDENT_AMBULATORY_CARE_PROVIDER_SITE_OTHER): Payer: Medicare Other | Admitting: Internal Medicine

## 2012-03-24 ENCOUNTER — Encounter: Payer: Self-pay | Admitting: Internal Medicine

## 2012-03-24 VITALS — BP 116/70 | HR 64 | Temp 98.0°F | Wt 164.0 lb

## 2012-03-24 DIAGNOSIS — R1013 Epigastric pain: Secondary | ICD-10-CM | POA: Insufficient documentation

## 2012-03-24 LAB — BASIC METABOLIC PANEL
Chloride: 102 mEq/L (ref 96–112)
GFR: 49.99 mL/min — ABNORMAL LOW (ref >60.00)
Glucose, Bld: 81 mg/dL (ref 70–99)
Potassium: 4.8 mEq/L (ref 3.5–5.1)
Sodium: 141 mEq/L (ref 135–145)

## 2012-03-24 LAB — CBC WITH DIFFERENTIAL/PLATELET
Eosinophils Relative: 0.7 % (ref 0.0–5.0)
Lymphocytes Relative: 45.9 % (ref 12.0–46.0)
MCV: 96.5 fl (ref 78.0–100.0)
Monocytes Absolute: 0.6 10*3/uL (ref 0.1–1.0)
Neutrophils Relative %: 42.4 % — ABNORMAL LOW (ref 43.0–77.0)
Platelets: 192 10*3/uL (ref 150.0–400.0)
WBC: 5.7 10*3/uL (ref 4.5–10.5)

## 2012-03-24 LAB — HEPATIC FUNCTION PANEL
AST: 19 U/L (ref 0–37)
Albumin: 4.3 g/dL (ref 3.5–5.2)
Alkaline Phosphatase: 72 U/L (ref 39–117)
Total Bilirubin: 0.8 mg/dL (ref 0.3–1.2)

## 2012-03-24 NOTE — Progress Notes (Signed)
Subjective:    Patient ID: [name] [name], female    DOB: 1932-10-08, 78 y.o.   MRN: [id number]  HPI Having trouble with her stomach Has soreness in epigastrium Radiates around the right flank  No problems eating or nausea or vomiting Has had constipation since on tramadol Using miralax and has to strain  Notices it when coughing or she yawns---she can feel it then Still has gallbladder Started 2 days ago Not related to eating  Had surgery on right foot Has been walking on it but tends to favor left foot Has used some tramadol---wondering if this could cause the problem  Current Outpatient Prescriptions on File Prior to Visit  Medication Sig Dispense Refill  . aspirin (ASPIRIN LOW DOSE) 81 MG tablet Take 81 mg by mouth daily.        . Calcium Carbonate-Vitamin D (CALCIUM-VITAMIN D) 500-200 MG-UNIT per tablet Take 1 tablet by mouth 2 (two) times daily with a meal.        . cetirizine (ZYRTEC) 10 MG tablet Take 10 mg by mouth as needed.       . Cranberry-Vitamin C-Vitamin E (CRANBERRY PLUS VITAMIN C PO) Take 1 tablet by mouth daily.       Marland Kitchen ezetimibe (ZETIA) 10 MG tablet Take 1 tablet (10 mg total) by mouth daily.  90 tablet  3  . glipiZIDE (GLUCOTROL) 5 MG tablet Take 1 tablet (5 mg total) by mouth once.  30 tablet  3  . glucose blood (BAYER CONTOUR TEST) test strip Use to test blood sugar twice daily  100 each  11  . ibuprofen (ADVIL,MOTRIN) 200 MG tablet Take 200 mg by mouth as needed.        Marland Kitchen losartan (COZAAR) 50 MG tablet Take 1 tablet (50 mg total) by mouth daily.  30 tablet  6  . meclizine (ANTIVERT) 25 MG tablet Take 25 mg by mouth every 8 (eight) hours.        . metoprolol succinate (TOPROL-XL) 100 MG 24 hr tablet Take one tablet by mouth in the morning.  30 tablet  6  . metoprolol succinate (TOPROL-XL) 50 MG 24 hr tablet Take one tablet by mouth in the evening.  30 tablet  6  . MICROLET LANCETS MISC Use with Bayer Contour Meter Test blood sugar twice daily  100 each  11    . Multiple Vitamins-Minerals (VISION FORMULA) TABS Take 1 tablet by mouth daily.        Marland Kitchen PREVACID 24HR 15 MG capsule TAKE ONE CAPSULE BY MOUTH ONE TIME DAILY  30 capsule  5  . traMADol (ULTRAM) 50 MG tablet Take 1 tablet (50 mg total) by mouth every 8 (eight) hours as needed for pain.  60 tablet  0  . DISCONTD: loratadine (CLARITIN) 10 MG tablet Take 10 mg by mouth as needed.         Allergies  Allergen Reactions  . Celecoxib     REACTION: [place]  . Cephalexin     REACTION: trash and swelling  . Ciprofloxacin     REACTION: u/k  . Clopidogrel Bisulfate     REACTION: swelling, rash  . Codeine Phosphate     REACTION: unspecified  . Ezetimibe-Simvastatin     REACTION: myalgias  . Nitrofurantoin     REACTION: [place]  . Pregabalin     REACTION: felt bad  . Propoxacet-N     REACTION: [place]  . Rofecoxib   . Shellfish Allergy   . Sulfamethoxazole  REACTION: unspecified  . Sulfonamide Derivatives     REACTION: unspecified  . Tussionex Pennkinetic Er     REACTION: u/k    Past Medical History  Diagnosis Date  . GERD (gastroesophageal reflux disease)   . CAD (coronary artery disease)   . Urticaria   . Hyperlipidemia   . Hypertension   . Non Hodgkin's lymphoma   . Allergy     Cipro, Plavix, Statins  . Personal history of colonic polyps   . Diabetes mellitus     Diagnosed 2012  . nhl dx'd 2003    chemo/xrt comp 2003  . Proctitis     Past Surgical History  Procedure Date  . Vaginal hysterectomy     partial, fibroids, one ovary left  . Knee surgery 10/2003    left  . Coronary angioplasty with stent placement     x 2  . Dexa-neg   . Cardiac cath-neg   . Laser surgery for cataracts-left   . Stress cardiolite     Family History  Problem Relation Age of Onset  . Cancer Mother     jaw  . Hypertension Father   . Heart attack Father   . Colon cancer Neg Hx   . Heart attack Sister     History   Social History  . Marital Status: Married    Spouse Name: N/A     Number of Children: N/A  . Years of Education: N/A   Occupational History  . Retired    Social History Main Topics  . Smoking status: Never Smoker   . Smokeless tobacco: Never Used  . Alcohol Use: No  . Drug Use: No  . Sexually Active: Not on file   Other Topics Concern  . Not on file   Social History Narrative  . No narrative on file   Review of Systems No fever Normal stool color Tends to have loose stools normally No cough or respiratory difficulty     Objective:   Physical Exam  Constitutional: She appears well-developed and well-nourished. No distress.  Neck: Normal range of motion. Neck supple.  Pulmonary/Chest: Effort normal and breath sounds normal. No respiratory distress. She has no wheezes. She has no rales.  Abdominal: Soft. Bowel sounds are normal. She exhibits no distension and no mass. There is tenderness. There is no rebound and no guarding.       Mild epigastric tenderness Mildly positive Murphy's sign also  Musculoskeletal: She exhibits no edema and no tenderness.  Lymphadenopathy:    She has no cervical adenopathy.  Psychiatric: She has a normal mood and affect. Her behavior is normal.          Assessment & Plan:

## 2012-03-24 NOTE — Patient Instructions (Signed)
Please take the miralax-- a capful -- 3 times a day till your bowels open up. You will need to continue this probably once or twice a day if you continue the tramadol  Call next week if the pain is not gone, we will set up ultrasound

## 2012-03-24 NOTE — Assessment & Plan Note (Signed)
2 days of symptoms Most likely cause is secondary to constipation No evidence of pneumonia or intrathoracic cause Could be from gallbladder though history is not consistent---certainly not acute cholecystitis  Notes her lymphoma was bowel not stomach so doubt recurrence of this  Will have her increase her bowel regimen If symptoms persist, will check RUQ ultrasound next week

## 2012-03-29 ENCOUNTER — Encounter: Payer: Self-pay | Admitting: Family Medicine

## 2012-03-29 ENCOUNTER — Encounter: Payer: Self-pay | Admitting: *Deleted

## 2012-03-30 ENCOUNTER — Encounter: Payer: Self-pay | Admitting: Family Medicine

## 2012-03-30 ENCOUNTER — Encounter: Payer: Self-pay | Admitting: *Deleted

## 2012-06-19 ENCOUNTER — Encounter: Payer: Self-pay | Admitting: Gastroenterology

## 2012-06-19 NOTE — Telephone Encounter (Signed)
Pt called back and wanted to disregard message....she is going to see her PCP

## 2012-06-20 ENCOUNTER — Encounter: Payer: Self-pay | Admitting: Family Medicine

## 2012-06-20 ENCOUNTER — Ambulatory Visit (INDEPENDENT_AMBULATORY_CARE_PROVIDER_SITE_OTHER): Payer: Medicare Other | Admitting: Family Medicine

## 2012-06-20 VITALS — BP 124/80 | HR 64 | Temp 97.7°F | Wt 167.0 lb

## 2012-06-20 DIAGNOSIS — R109 Unspecified abdominal pain: Secondary | ICD-10-CM

## 2012-06-20 DIAGNOSIS — R1032 Left lower quadrant pain: Secondary | ICD-10-CM

## 2012-06-20 DIAGNOSIS — E119 Type 2 diabetes mellitus without complications: Secondary | ICD-10-CM

## 2012-06-20 LAB — CBC WITH DIFFERENTIAL/PLATELET
Basophils Relative: 0.4 % (ref 0.0–3.0)
Eosinophils Absolute: 0.1 10*3/uL (ref 0.0–0.7)
Eosinophils Relative: 1.5 % (ref 0.0–5.0)
Lymphocytes Relative: 36.9 % (ref 12.0–46.0)
Neutrophils Relative %: 52 % (ref 43.0–77.0)
RBC: 4.22 Mil/uL (ref 3.87–5.11)
WBC: 4.7 10*3/uL (ref 4.5–10.5)

## 2012-06-20 LAB — COMPREHENSIVE METABOLIC PANEL
Albumin: 4.1 g/dL (ref 3.5–5.2)
BUN: 28 mg/dL — ABNORMAL HIGH (ref 6–23)
Calcium: 9.4 mg/dL (ref 8.4–10.5)
Chloride: 100 mEq/L (ref 96–112)
GFR: 41.69 mL/min — ABNORMAL LOW (ref >60.00)
Glucose, Bld: 191 mg/dL — ABNORMAL HIGH (ref 70–99)
Potassium: 4.8 mEq/L (ref 3.5–5.1)

## 2012-06-20 LAB — POCT URINALYSIS DIPSTICK
Bilirubin, UA: NEGATIVE
Blood, UA: NEGATIVE
Glucose, UA: NEGATIVE
Spec Grav, UA: 1.025

## 2012-06-20 MED ORDER — AMOXICILLIN-POT CLAVULANATE 875-125 MG PO TABS: 1.0000 | ORAL_TABLET | Freq: Two times a day (BID) | ORAL | Status: AC

## 2012-06-20 NOTE — Patient Instructions (Signed)
Good to see you. Please take Augmentin as directed- 1 tablet twice daily for 10 days. Stop by to see Shirlee Limerick on your way out to set up your CAT scan.  Diverticulitis A diverticulum is a small pouch or sac on the colon. Diverticulosis is the presence of these diverticula on the colon. Diverticulitis is the irritation (inflammation) or infection of diverticula. CAUSES  The colon and its diverticula contain bacteria. If food particles block the tiny opening to a diverticulum, the bacteria inside can grow and cause an increase in pressure. This leads to infection and inflammation and is called diverticulitis. SYMPTOMS   Abdominal pain and tenderness. Usually, the pain is located on the left side of your abdomen. However, it could be located elsewhere.   Fever.   Bloating.   Feeling sick to your stomach (nausea).   Throwing up (vomiting).   Abnormal stools.  DIAGNOSIS  Your caregiver will take a history and perform a physical exam. Since many things can cause abdominal pain, other tests may be necessary. Tests may include:  Blood tests.   Urine tests.   X-ray of the abdomen.   CT scan of the abdomen.  Sometimes, surgery is needed to determine if diverticulitis or other conditions are causing your symptoms. TREATMENT  Most of the time, you can be treated without surgery. Treatment includes:  Resting the bowels by only having liquids for a few days. As you improve, you will need to eat a low-fiber diet.   Intravenous (IV) fluids if you are losing body fluids (dehydrated).   Antibiotic medicines that treat infections may be given.   Pain and nausea medicine, if needed.   Surgery if the inflamed diverticulum has burst.  HOME CARE INSTRUCTIONS   Try a clear liquid diet (broth, tea, or water for as long as directed by your caregiver). You may then gradually begin a low-fiber diet as tolerated. A low-fiber diet is a diet with less than 10 grams of fiber. Choose the foods below to  reduce fiber in the diet:   White breads, cereals, rice, and pasta.   Cooked fruits and vegetables or soft fresh fruits and vegetables without the skin.   Ground or well-cooked tender beef, ham, veal, lamb, pork, or poultry.   Eggs and seafood.   After your diverticulitis symptoms have improved, your caregiver may put you on a high-fiber diet. A high-fiber diet includes 14 grams of fiber for every 1000 calories consumed. For a standard 2000 calorie diet, you would need 28 grams of fiber. Follow these diet guidelines to help you increase the fiber in your diet. It is important to slowly increase the amount fiber in your diet to avoid gas, constipation, and bloating.   Choose whole-grain breads, cereals, pasta, and brown rice.   Choose fresh fruits and vegetables with the skin on. Do not overcook vegetables because the more vegetables are cooked, the more fiber is lost.   Choose more nuts, seeds, legumes, dried peas, beans, and lentils.   Look for food products that have greater than 3 grams of fiber per serving on the Nutrition Facts label.   Take all medicine as directed by your caregiver.   If your caregiver has given you a follow-up appointment, it is very important that you go. Not going could result in lasting (chronic) or permanent injury, pain, and disability. If there is any problem keeping the appointment, call to reschedule.  SEEK MEDICAL CARE IF:   Your pain does not improve.   You  have a hard time advancing your diet beyond clear liquids.   Your bowel movements do not return to normal.  SEEK IMMEDIATE MEDICAL CARE IF:   Your pain becomes worse.   You have an oral temperature above 102 F (38.9 C), not controlled by medicine.   You have repeated vomiting.   You have bloody or black, tarry stools.   Symptoms that brought you to your caregiver become worse or are not getting better.  MAKE SURE YOU:   Understand these instructions.   Will watch your condition.    Will get help right away if you are not doing well or get worse.  Document Released: 08/25/2005 Document Revised: 11/04/2011 Document Reviewed: 12/21/2010 [facility] Patient Information 2012 [place], [place].

## 2012-06-20 NOTE — Progress Notes (Signed)
Subjective:    Patient ID: [name] [name], female    DOB: June 20, 1932, 78 y.o.   MRN: [id number]  HPI  78 yo female here with intermittent LLQ abdominal pain for 5 days.  Has felt bloated.  Finally had a BM yesterday and symptoms a little better. No blood in stool that she could see. Does have a h/o colitis and diverticulosis (visible on colonoscopy dated 02/2009). No fevers but did have chills last night.  No dysuria.  No back pain.  No n/v/d.  Multiple drug allergies but states she takes amoxicillin frequently without issue (also h/o of amoxicillin use in Epic).  Patient Active Problem List  Diagnosis  . 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  . LYMPHOMA, MALT  . HYPERCHOLESTEROLEMIA  . MIXED HYPERLIPIDEMIA  . ELECTROLYTE AND FLUID DISORDERS NEC  . DETACHED RETINA  . LABILE HYPERTENSION  . 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 CORONARY ARTERY DISEASE  . CAD, NATIVE VESSEL  . MITRAL VALVE PROLAPSE  . EXTERNAL HEMORRHOIDS  . Acute Sinusitis, Unspecified  . GERD  . COLITIS  . DIVERTICULOSIS, COLON  . IBS  . UTI  . BREAST CYSTS, BILATERAL  . URTICARIA, IDIOPATHIC  . UNSPECIFIED DISORDER OF SKIN&SUBCUTANEOUS TISSUE  . ELBOW PAIN, RIGHT  . HIP PAIN, LEFT  . BACK PAIN, THORACIC REGION  . LOW BACK PAIN, CHRONIC  . OSTEOPENIA  . VERTIGO  . OTHER MALAISE AND FATIGUE  . FACIAL PAIN  . EPISTAXIS  . HEMOPTYSIS  . Unspecified Chest Pain  . DIARRHEA  . ABDOMINAL PAIN  . ABNORMAL RESULT, FUNCTION STUDY, LIVER  . SPRAIN/STRAIN, NECK  . LUMBAR SPRAIN AND STRAIN  . CONTUSION, ARM  . ALLERGIC REACTION, ACUTE  . BACK PAIN, ACUTE  . Glucosuria  . Diabetes mellitus  . Renal insufficiency  . BRBPR (bright red blood per rectum)  . External hemorrhoid, bleeding  . Abdominal pain, chronic, epigastric   Past Medical History  Diagnosis Date  . GERD (gastroesophageal reflux disease)   . CAD (coronary artery disease)   . Urticaria   . Hyperlipidemia   .  Hypertension   . Non Hodgkin's lymphoma   . Allergy     Cipro, Plavix, Statins  . Personal history of colonic polyps   . Diabetes mellitus     Diagnosed 2012  . nhl dx'd 2003    chemo/xrt comp 2003  . Proctitis    Past Surgical History  Procedure Date  . Vaginal hysterectomy     partial, fibroids, one ovary left  . Knee surgery 10/2003    left  . Coronary angioplasty with stent placement     x 2  . Dexa-neg   . Cardiac cath-neg   . Laser surgery for cataracts-left   . Stress cardiolite    History  Substance Use Topics  . Smoking status: Never Smoker   . Smokeless tobacco: Never Used  . Alcohol Use: No   Family History  Problem Relation Age of Onset  . Cancer Mother     jaw  . Hypertension Father   . Heart attack Father   . Colon cancer Neg Hx   . Heart attack Sister    Allergies  Allergen Reactions  . Celecoxib     REACTION: [place]  . Cephalexin     REACTION: trash and swelling  . Ciprofloxacin     REACTION: u/k  . Clopidogrel Bisulfate     REACTION: swelling, rash  . Codeine Phosphate  REACTION: unspecified  . Ezetimibe-Simvastatin     REACTION: myalgias  . Hydrocod Polst-Cpm Polst Er     REACTION: u/k  . Nitrofurantoin     REACTION: [place]  . Pregabalin     REACTION: felt bad  . Propoxyphene-Acetaminophen     REACTION: [place]  . Rofecoxib   . Shellfish Allergy   . Sulfamethoxazole     REACTION: unspecified  . Sulfonamide Derivatives     REACTION: unspecified   Current Outpatient Prescriptions on File Prior to Visit  Medication Sig Dispense Refill  . aspirin (ASPIRIN LOW DOSE) 81 MG tablet Take 81 mg by mouth daily.        . Calcium Carbonate-Vitamin D (CALCIUM-VITAMIN D) 500-200 MG-UNIT per tablet Take 1 tablet by mouth 2 (two) times daily with a meal.        . cetirizine (ZYRTEC) 10 MG tablet Take 10 mg by mouth as needed.       . ezetimibe (ZETIA) 10 MG tablet Take 1 tablet (10 mg total) by mouth daily.  90 tablet  3  . glipiZIDE (GLUCOTROL) 5 MG  tablet Take 1 tablet (5 mg total) by mouth once.  30 tablet  3  . glucose blood (BAYER CONTOUR TEST) test strip Use to test blood sugar twice daily  100 each  11  . ibuprofen (ADVIL,MOTRIN) 200 MG tablet Take 200 mg by mouth as needed.        Marland Kitchen losartan (COZAAR) 50 MG tablet Take 1 tablet (50 mg total) by mouth daily.  30 tablet  6  . meclizine (ANTIVERT) 25 MG tablet Take 25 mg by mouth every 8 (eight) hours.        . metoprolol succinate (TOPROL-XL) 100 MG 24 hr tablet Take one tablet by mouth in the morning.  30 tablet  6  . metoprolol succinate (TOPROL-XL) 50 MG 24 hr tablet Take one tablet by mouth in the evening.  30 tablet  6  . MICROLET LANCETS MISC Use with Bayer Contour Meter Test blood sugar twice daily  100 each  11  . Multiple Vitamins-Minerals (VISION FORMULA) TABS Take 1 tablet by mouth daily.        Marland Kitchen PREVACID 24HR 15 MG capsule TAKE ONE CAPSULE BY MOUTH ONE TIME DAILY  30 capsule  5  . DISCONTD: loratadine (CLARITIN) 10 MG tablet Take 10 mg by mouth as needed.        The PMH, PSH, Social History, Family History, Medications, and allergies have been reviewed in [facility], and have been updated if relevant.   Review of Systems    See HPI Objective:   Physical Exam BP 124/80  Pulse 64  Temp 97.7 F (36.5 C)  Wt 167 lb (75.751 kg)  General:  Well-developed,well-nourished,in no acute distress; alert,appropriate and cooperative throughout examination Head:  normocephalic and atraumatic.   Abdomen:  Bowel sounds positive, TTP over LLQ, no rebound or guarding Msk:  No deformity or scoliosis noted of thoracic or lumbar spine.   Extremities:  No clubbing, cyanosis, edema, or deformity noted with normal full range of motion of all joints.   Neurologic:  alert & oriented X3 and gait normal.   Skin:  Intact without suspicious lesions or rashes Psych:  Cognition and judgment appear intact. Alert and cooperative with normal attention span and concentration. No apparent delusions,  illusions, hallucinations        Assessment & Plan:   1. LLQ pain  CT Abdomen Pelvis W Contrast, Comprehensive metabolic panel,  CBC with Differential   New- history and exam concerning for diverticulitis in pt with known sigmoid diverticulosis. Will treat with Augmentin 875 twice daily x 10 days given multiple abx allergies. CT of abd/pelvis to confirm and r/o perf. UA neg.

## 2012-06-21 ENCOUNTER — Other Ambulatory Visit: Payer: Self-pay | Admitting: Family Medicine

## 2012-06-21 DIAGNOSIS — N289 Disorder of kidney and ureter, unspecified: Secondary | ICD-10-CM

## 2012-06-22 ENCOUNTER — Ambulatory Visit (INDEPENDENT_AMBULATORY_CARE_PROVIDER_SITE_OTHER)
Admission: RE | Admit: 2012-06-22 | Discharge: 2012-06-22 | Disposition: A | Discharge status: Home / Self Care | Payer: Medicare Other | Source: Ambulatory Visit | Attending: Family Medicine | Admitting: Family Medicine

## 2012-06-22 DIAGNOSIS — R1032 Left lower quadrant pain: Secondary | ICD-10-CM

## 2012-06-22 MED ORDER — IOHEXOL 300 MG/ML  SOLN
80.0000 mL | Freq: Once | INTRAMUSCULAR | Status: AC | PRN
  Administered 2012-06-22: 80 mL via INTRAVENOUS

## 2012-06-27 ENCOUNTER — Other Ambulatory Visit (INDEPENDENT_AMBULATORY_CARE_PROVIDER_SITE_OTHER): Payer: Medicare Other

## 2012-06-27 ENCOUNTER — Telehealth: Payer: Self-pay | Admitting: *Deleted

## 2012-06-27 DIAGNOSIS — N289 Disorder of kidney and ureter, unspecified: Secondary | ICD-10-CM

## 2012-06-27 LAB — COMPREHENSIVE METABOLIC PANEL
Albumin: 3.9 g/dL (ref 3.5–5.2)
Alkaline Phosphatase: 59 U/L (ref 39–117)
Glucose, Bld: 46 mg/dL — CL (ref 70–99)
Potassium: 5.4 mEq/L — ABNORMAL HIGH (ref 3.5–5.1)
Sodium: 141 mEq/L (ref 135–145)
Total Protein: 6.7 g/dL (ref 6.0–8.3)

## 2012-06-27 NOTE — Telephone Encounter (Signed)
[name] [name] with lab called with critical glucose of 46.

## 2012-06-27 NOTE — Telephone Encounter (Signed)
Noted! Thank you

## 2012-06-27 NOTE — Telephone Encounter (Signed)
Please recheck BMET as this may simply be a lab error.

## 2012-06-27 NOTE — Telephone Encounter (Signed)
Patient called back, she said she was having a low blood sugar episode when she was here getting her blood work done. Ate cereal about 6:30 am, then here in the Lab at 10:15. She left and ate at [facility] and felt better. I did schedule her for, 07-03-12 for a BMET.

## 2012-06-27 NOTE — Telephone Encounter (Signed)
lmom for patient to call.

## 2012-06-28 ENCOUNTER — Other Ambulatory Visit (INDEPENDENT_AMBULATORY_CARE_PROVIDER_SITE_OTHER): Payer: Medicare Other

## 2012-06-28 DIAGNOSIS — R7309 Other abnormal glucose: Secondary | ICD-10-CM

## 2012-06-28 LAB — BASIC METABOLIC PANEL
BUN: 17 mg/dL (ref 6–23)
Calcium: 9.1 mg/dL (ref 8.4–10.5)
Creatinine, Ser: 1.3 mg/dL — ABNORMAL HIGH (ref 0.4–1.2)
GFR: 43.6 mL/min — ABNORMAL LOW (ref >60.00)
Glucose, Bld: 138 mg/dL — ABNORMAL HIGH (ref 70–99)
Sodium: 139 mEq/L (ref 135–145)

## 2012-06-28 NOTE — Telephone Encounter (Signed)
Her potassium was elevated as well so I would like BMET redrawn today if possible . If that's a true value, we need to treat her hyperkalemia. Thanks

## 2012-06-28 NOTE — Telephone Encounter (Signed)
Pt had lab work drawn today

## 2012-07-03 ENCOUNTER — Other Ambulatory Visit: Payer: Self-pay | Admitting: Family Medicine

## 2012-07-03 ENCOUNTER — Other Ambulatory Visit: Payer: Medicare Other

## 2012-07-21 ENCOUNTER — Other Ambulatory Visit: Payer: Self-pay

## 2012-07-21 DIAGNOSIS — I10 Essential (primary) hypertension: Secondary | ICD-10-CM

## 2012-07-21 MED ORDER — LOSARTAN POTASSIUM 50 MG PO TABS: 50.0000 mg | ORAL_TABLET | Freq: Every day | ORAL | Status: DC

## 2012-08-16 ENCOUNTER — Encounter: Payer: Self-pay | Admitting: Cardiovascular Disease

## 2012-08-16 ENCOUNTER — Ambulatory Visit (INDEPENDENT_AMBULATORY_CARE_PROVIDER_SITE_OTHER): Payer: Medicare Other | Admitting: Cardiovascular Disease

## 2012-08-16 VITALS — BP 124/78 | HR 58 | Ht 69.0 in | Wt 170.0 lb

## 2012-08-16 DIAGNOSIS — I10 Essential (primary) hypertension: Secondary | ICD-10-CM

## 2012-08-16 DIAGNOSIS — I2510 Atherosclerotic heart disease of native coronary artery without angina pectoris: Secondary | ICD-10-CM

## 2012-08-16 MED ORDER — METOPROLOL SUCCINATE ER 100 MG PO TB24: ORAL_TABLET | ORAL | Status: DC

## 2012-08-16 NOTE — Progress Notes (Signed)
History of Present Illness: 78 yo female with history of CAD, HLD, HTN, non-Hodgkin's lymphoma, DM here today for cardiac follow up. She has been followed in the past by Dr. Juanda Chance. In 2009 she had tandem non-overlapping Promus drug-eluting stents placed in the LAD. She was evaluated in 2010 for chest pain and had a negative Myoview scan. She has been allergic to Plavix and is currently taking Ticlid. She has been intolerant to statins in the past. She has been on Zetia on doing well.   She is here for follow up. She has been doing well. She has occasional chest pains but these have been present for many years and she says it is unchanged. She also has some shortness of breath when she is rushing around. No palpitations. Her BP has been well controlled at home. She does describe fatigue. She has been on twice daily Toprol.   Primary Care Physician: Ruthe Mannan  Last Lipid Profile:Lipid Panel     Component Value Date/Time   CHOL 163 06/17/2011 0830   TRIG 156.0* 06/17/2011 0830   HDL 45.40 06/17/2011 0830   CHOLHDL 4 06/17/2011 0830   VLDL 31.2 06/17/2011 0830   LDLCALC 86 06/17/2011 0830     Past Medical History  Diagnosis Date  . GERD (gastroesophageal reflux disease)   . CAD (coronary artery disease)   . Urticaria   . Hyperlipidemia   . Hypertension   . Non Hodgkin's lymphoma   . Allergy     Cipro, Plavix, Statins  . Personal history of colonic polyps   . Diabetes mellitus     Diagnosed 2012  . nhl dx'd 2003    chemo/xrt comp 2003  . Proctitis     Past Surgical History  Procedure Date  . Vaginal hysterectomy     partial, fibroids, one ovary left  . Knee surgery 10/2003    left  . Coronary angioplasty with stent placement     x 2  . Dexa-neg   . Cardiac cath-neg   . Laser surgery for cataracts-left   . Stress cardiolite     Current Outpatient Prescriptions  Medication Sig Dispense Refill  . aspirin (ASPIRIN LOW DOSE) 81 MG tablet Take 81 mg by mouth daily.        Marland Kitchen  BAYER CONTOUR TEST test strip USE TO CHECK BLOOD SUGAR TWICE A DAY  100 each  5  . Calcium Carbonate-Vitamin D (CALCIUM-VITAMIN D) 500-200 MG-UNIT per tablet Take 1 tablet by mouth 2 (two) times daily with a meal.        . cetirizine (ZYRTEC) 10 MG tablet Take 10 mg by mouth as needed.       . ezetimibe (ZETIA) 10 MG tablet Take 1 tablet (10 mg total) by mouth daily.  90 tablet  3  . glipiZIDE (GLUCOTROL) 5 MG tablet Take 1 tablet (5 mg total) by mouth once.  30 tablet  3  . ibuprofen (ADVIL,MOTRIN) 200 MG tablet Take 200 mg by mouth as needed.        Marland Kitchen losartan (COZAAR) 50 MG tablet Take 1 tablet (50 mg total) by mouth daily.  30 tablet  6  . meclizine (ANTIVERT) 25 MG tablet Take 25 mg by mouth every 8 (eight) hours.        . metoprolol succinate (TOPROL-XL) 100 MG 24 hr tablet Take one tablet by mouth in the morning.  30 tablet  6  . metoprolol succinate (TOPROL-XL) 50 MG 24 hr tablet Take one tablet  by mouth in the evening.  30 tablet  6  . MICROLET LANCETS MISC USE WITH BAYER CONTOUR METER  TEST BLOOD SUGAR TWICE DAILY  100 each  5  . Multiple Vitamins-Minerals (VISION FORMULA) TABS Take 1 tablet by mouth daily.        Marland Kitchen PREVACID 24HR 15 MG capsule TAKE ONE CAPSULE BY MOUTH ONE TIME DAILY  30 capsule  5  . DISCONTD: loratadine (CLARITIN) 10 MG tablet Take 10 mg by mouth as needed.         Allergies  Allergen Reactions  . Celecoxib     REACTION: [place]  . Cephalexin     REACTION: trash and swelling  . Ciprofloxacin     REACTION: u/k  . Clopidogrel Bisulfate     REACTION: swelling, rash  . Codeine Phosphate     REACTION: unspecified  . Ezetimibe-Simvastatin     REACTION: myalgias  . Hydrocod Polst-Cpm Polst Er     REACTION: u/k  . Nitrofurantoin     REACTION: [place]  . Pregabalin     REACTION: felt bad  . Propoxyphene-Acetaminophen     REACTION: [place]  . Rofecoxib   . Shellfish Allergy   . Sulfamethoxazole     REACTION: unspecified  . Sulfonamide Derivatives     REACTION:  unspecified    History   Social History  . Marital Status: Married    Spouse Name: N/A    Number of Children: N/A  . Years of Education: N/A   Occupational History  . Retired    Social History Main Topics  . Smoking status: Never Smoker   . Smokeless tobacco: Never Used  . Alcohol Use: No  . Drug Use: No  . Sexually Active: Not on file   Other Topics Concern  . Not on file   Social History Narrative  . No narrative on file    Family History  Problem Relation Age of Onset  . Cancer Mother     jaw  . Hypertension Father   . Heart attack Father   . Colon cancer Neg Hx   . Heart attack Sister     Review of Systems:  As stated in the HPI and otherwise negative.   BP 124/78  Pulse 58  Ht 5\' 9"  (1.753 m)  Wt 170 lb (77.111 kg)  BMI 25.10 kg/m2  Physical Examination: General: Well developed, well nourished, NAD HEENT: OP clear, mucus membranes moist SKIN: warm, dry. No rashes. Neuro: No focal deficits Musculoskeletal: Muscle strength 5/5 all ext Psychiatric: Mood and affect normal Neck: No JVD, no carotid bruits, no thyromegaly, no lymphadenopathy. Lungs:Clear bilaterally, no wheezes, rhonci, crackles Cardiovascular: Huston Foley, Regular rhythm. No murmurs, gallops or rubs. Abdomen:Soft. Bowel sounds present. Non-tender.  Extremities: No lower extremity edema. Pulses are 2 + in the bilateral DP/PT.  EKG: Sinus bradycardia, rate 58 bpm.   Assessment and Plan:   1. CORONARY ARTERY DISEASE: Stable. No change in chronic chest pain. Continue Zetia, ASA, Toprol but will lower Toprol dose to 100 mg po QAM.  She is intolerant to statins.   2. Hyperlipidemia: Will continue Zetia. Check fasting lipids.

## 2012-08-16 NOTE — Patient Instructions (Signed)
Your physician wants you to follow-up in:  12 months.You will receive a reminder letter in the mail two months in advance. If you don't receive a letter, please call our office to schedule the follow-up appointment.  Your physician has recommended you make the following change in your medication:  Change Toprol to 100 mg by mouth every AM  Your physician recommends that you return for fasting lab work later this week or next week.--lipid profile

## 2012-08-22 ENCOUNTER — Other Ambulatory Visit (INDEPENDENT_AMBULATORY_CARE_PROVIDER_SITE_OTHER): Payer: Medicare Other

## 2012-08-22 DIAGNOSIS — I2510 Atherosclerotic heart disease of native coronary artery without angina pectoris: Secondary | ICD-10-CM

## 2012-08-22 LAB — LIPID PANEL
HDL: 42.8 mg/dL (ref >39.00)
LDL Cholesterol: 89 mg/dL (ref 0–99)
Total CHOL/HDL Ratio: 4
Triglycerides: 194 mg/dL — ABNORMAL HIGH (ref 0.0–149.0)

## 2012-08-28 ENCOUNTER — Ambulatory Visit (INDEPENDENT_AMBULATORY_CARE_PROVIDER_SITE_OTHER): Payer: Medicare Other

## 2012-08-28 ENCOUNTER — Ambulatory Visit (INDEPENDENT_AMBULATORY_CARE_PROVIDER_SITE_OTHER): Payer: Medicare Other | Admitting: Internal Medicine

## 2012-08-28 ENCOUNTER — Encounter: Payer: Self-pay | Admitting: Internal Medicine

## 2012-08-28 VITALS — BP 138/80 | HR 68 | Temp 98.2°F | Wt 171.0 lb

## 2012-08-28 DIAGNOSIS — R300 Dysuria: Secondary | ICD-10-CM

## 2012-08-28 DIAGNOSIS — Z23 Encounter for immunization: Secondary | ICD-10-CM

## 2012-08-28 DIAGNOSIS — N3000 Acute cystitis without hematuria: Secondary | ICD-10-CM

## 2012-08-28 LAB — POCT URINALYSIS DIPSTICK
Blood, UA: NEGATIVE
Nitrite, UA: POSITIVE
Protein, UA: NEGATIVE
Urobilinogen, UA: NEGATIVE
pH, UA: 6.5

## 2012-08-28 MED ORDER — AMOXICILLIN 500 MG PO TABS: 1000.0000 mg | ORAL_TABLET | Freq: Two times a day (BID) | ORAL | Status: DC

## 2012-08-28 NOTE — Progress Notes (Signed)
Subjective:    Patient ID: [name] [name], female    DOB: 12/29/1931, 78 y.o.   MRN: [id number]  HPI Feels she has a UTI Started today Has lower abdominal pressure and burning dysuria Tried azo--has helped some  No fever No abdominal pain Has urgency and increased frequency No blood in urine No back pain  Takes cranberrry pills regularly and has tried some cranberry juice  Current Outpatient Prescriptions on File Prior to Visit  Medication Sig Dispense Refill  . aspirin (ASPIRIN LOW DOSE) 81 MG tablet Take 81 mg by mouth daily.        Marland Kitchen BAYER CONTOUR TEST test strip USE TO CHECK BLOOD SUGAR TWICE A DAY  100 each  5  . Calcium Carbonate-Vitamin D (CALCIUM-VITAMIN D) 500-200 MG-UNIT per tablet Take 1 tablet by mouth 2 (two) times daily with a meal.        . cetirizine (ZYRTEC) 10 MG tablet Take 10 mg by mouth as needed.       . ezetimibe (ZETIA) 10 MG tablet Take 1 tablet (10 mg total) by mouth daily.  90 tablet  3  . glipiZIDE (GLUCOTROL) 5 MG tablet Take 1 tablet (5 mg total) by mouth once.  30 tablet  3  . ibuprofen (ADVIL,MOTRIN) 200 MG tablet Take 200 mg by mouth as needed.        Marland Kitchen losartan (COZAAR) 50 MG tablet Take 1 tablet (50 mg total) by mouth daily.  30 tablet  6  . meclizine (ANTIVERT) 25 MG tablet Take 25 mg by mouth every 8 (eight) hours.        . metoprolol succinate (TOPROL-XL) 100 MG 24 hr tablet Take one tablet by mouth in the morning.  30 tablet  11  . MICROLET LANCETS MISC USE WITH BAYER CONTOUR METER  TEST BLOOD SUGAR TWICE DAILY  100 each  5  . Multiple Vitamins-Minerals (VISION FORMULA) TABS Take 1 tablet by mouth daily.        Marland Kitchen PREVACID 24HR 15 MG capsule TAKE ONE CAPSULE BY MOUTH ONE TIME DAILY  30 capsule  5  . DISCONTD: loratadine (CLARITIN) 10 MG tablet Take 10 mg by mouth as needed.         Allergies  Allergen Reactions  . Celecoxib     REACTION: [place]  . Cephalexin     REACTION: trash and swelling  . Ciprofloxacin     REACTION: u/k  . Clopidogrel  Bisulfate     REACTION: swelling, rash  . Codeine Phosphate     REACTION: unspecified  . Ezetimibe-Simvastatin     REACTION: myalgias  . Hydrocod Polst-Cpm Polst Er     REACTION: u/k  . Nitrofurantoin     REACTION: [place]  . Pregabalin     REACTION: felt bad  . Propoxyphene-Acetaminophen     REACTION: [place]  . Rofecoxib   . Shellfish Allergy   . Sulfamethoxazole     REACTION: unspecified  . Sulfonamide Derivatives     REACTION: unspecified    Past Medical History  Diagnosis Date  . GERD (gastroesophageal reflux disease)   . CAD (coronary artery disease)   . Urticaria   . Hyperlipidemia   . Hypertension   . Non Hodgkin's lymphoma   . Allergy     Cipro, Plavix, Statins  . Personal history of colonic polyps   . Diabetes mellitus     Diagnosed 2012  . nhl dx'd 2003    chemo/xrt comp 2003  . Proctitis  Past Surgical History  Procedure Date  . Vaginal hysterectomy     partial, fibroids, one ovary left  . Knee surgery 10/2003    left  . Coronary angioplasty with stent placement     x 2  . Dexa-neg   . Cardiac cath-neg   . Laser surgery for cataracts-left   . Stress cardiolite     Family History  Problem Relation Age of Onset  . Cancer Mother     jaw  . Hypertension Father   . Heart attack Father   . Colon cancer Neg Hx   . Heart attack Sister     History   Social History  . Marital Status: Married    Spouse Name: N/A    Number of Children: N/A  . Years of Education: N/A   Occupational History  . Retired    Social History Main Topics  . Smoking status: Never Smoker   . Smokeless tobacco: Never Used  . Alcohol Use: No  . Drug Use: No  . Sexually Active: Not on file   Other Topics Concern  . Not on file   Social History Narrative  . No narrative on file   Review of Systems No nausea or vomiting Appetite is fine     Objective:   Physical Exam  Constitutional: She appears well-developed and well-nourished. No distress.  Abdominal: Soft.  She exhibits no distension. There is no tenderness. There is no rebound and no guarding.  Musculoskeletal:       No CVA tenderness          Assessment & Plan:

## 2012-08-28 NOTE — Patient Instructions (Signed)
If your bladder symptoms go away with the first or second dose of antibiotic, you can stop treatment after 3 days

## 2012-08-28 NOTE — Assessment & Plan Note (Signed)
Fairly classic symptoms No systemic symptoms Will treat with amoxil--- 3 days may be enough Azo messed up urine dip---would send culture if any recurrent symptoms despite the Rx

## 2012-08-29 ENCOUNTER — Ambulatory Visit: Payer: Medicare Other | Admitting: Family Medicine

## 2012-10-02 ENCOUNTER — Encounter: Payer: Self-pay | Admitting: Family Medicine

## 2012-10-02 ENCOUNTER — Ambulatory Visit (INDEPENDENT_AMBULATORY_CARE_PROVIDER_SITE_OTHER)
Admission: RE | Admit: 2012-10-02 | Discharge: 2012-10-02 | Disposition: A | Discharge status: Home / Self Care | Payer: Medicare Other | Source: Ambulatory Visit | Attending: Family Medicine | Admitting: Family Medicine

## 2012-10-02 ENCOUNTER — Ambulatory Visit (INDEPENDENT_AMBULATORY_CARE_PROVIDER_SITE_OTHER): Payer: Medicare Other | Admitting: Family Medicine

## 2012-10-02 VITALS — BP 124/80 | HR 64 | Temp 98.0°F | Wt 169.0 lb

## 2012-10-02 DIAGNOSIS — M25552 Pain in left hip: Secondary | ICD-10-CM | POA: Insufficient documentation

## 2012-10-02 DIAGNOSIS — M25559 Pain in unspecified hip: Secondary | ICD-10-CM

## 2012-10-02 DIAGNOSIS — E119 Type 2 diabetes mellitus without complications: Secondary | ICD-10-CM

## 2012-10-02 DIAGNOSIS — R350 Frequency of micturition: Secondary | ICD-10-CM

## 2012-10-02 LAB — COMPREHENSIVE METABOLIC PANEL
BUN: 18 mg/dL (ref 6–23)
CO2: 29 mEq/L (ref 19–32)
Creatinine, Ser: 1.2 mg/dL (ref 0.4–1.2)
GFR: 47 mL/min — ABNORMAL LOW (ref >60.00)
Glucose, Bld: 73 mg/dL (ref 70–99)
Sodium: 138 mEq/L (ref 135–145)
Total Bilirubin: 0.8 mg/dL (ref 0.3–1.2)
Total Protein: 7.7 g/dL (ref 6.0–8.3)

## 2012-10-02 LAB — POCT URINALYSIS DIPSTICK
Blood, UA: NEGATIVE
Spec Grav, UA: 1.02
Urobilinogen, UA: NEGATIVE
pH, UA: 6

## 2012-10-02 NOTE — Progress Notes (Signed)
Subjective:    Patient ID: [name] [name], female    DOB: 12-28-1931, 78 y.o.   MRN: [id number]  HPI 78 yo very pleasant female with h/o well controlled DM here to discuss several issues.  1.  Urinary frequency-  No dysuria. Has noticed increased urinary frequency/mild supra pubic pressure x 1 month. Saw Dr. Vonita Moss several months ago before he retired, urology, who gave her "an antibiotic" to take for 3 days when she has symptoms.  She took this, cannot remember the name. Feels symptoms are improving.  No fever No abdominal pain Has urgency and increased frequency No blood in urine No back pain  Last UTI was last month- treated with amoxicillin (saw Dr. Alphonsus Sias).  2.  Left leg/hip pain- only feels it few steps of the day or if she goes up and down steps.  Symptoms intermittent for past few months. No LE weakness.  No groin pain.    3.  DM- checks her CBGs every other day, typically runs between 100-120.  On Glipizide 5 mg daily.  Denies any episodes of hypoglycemia.  Lab Results  Component Value Date   HGBA1C 6.1 06/20/2012     Current Outpatient Prescriptions on File Prior to Visit  Medication Sig Dispense Refill  . aspirin (ASPIRIN LOW DOSE) 81 MG tablet Take 81 mg by mouth daily.        Marland Kitchen BAYER CONTOUR TEST test strip USE TO CHECK BLOOD SUGAR TWICE A DAY  100 each  5  . cetirizine (ZYRTEC) 10 MG tablet Take 10 mg by mouth as needed.       . ezetimibe (ZETIA) 10 MG tablet Take 1 tablet (10 mg total) by mouth daily.  90 tablet  3  . glipiZIDE (GLUCOTROL) 5 MG tablet Take 1 tablet (5 mg total) by mouth once.  30 tablet  3  . ibuprofen (ADVIL,MOTRIN) 200 MG tablet Take 200 mg by mouth as needed.        Marland Kitchen losartan (COZAAR) 50 MG tablet Take 1 tablet (50 mg total) by mouth daily.  30 tablet  6  . meclizine (ANTIVERT) 25 MG tablet Take 25 mg by mouth every 8 (eight) hours.        . metoprolol succinate (TOPROL-XL) 100 MG 24 hr tablet Take one tablet by mouth in the morning.  30  tablet  11  . MICROLET LANCETS MISC USE WITH BAYER CONTOUR METER  TEST BLOOD SUGAR TWICE DAILY  100 each  5  . Multiple Vitamins-Minerals (VISION FORMULA) TABS Take 1 tablet by mouth daily.        Marland Kitchen PREVACID 24HR 15 MG capsule TAKE ONE CAPSULE BY MOUTH ONE TIME DAILY  30 capsule  5  . Calcium Carbonate-Vitamin D (CALCIUM-VITAMIN D) 500-200 MG-UNIT per tablet Take 1 tablet by mouth 2 (two) times daily with a meal.        . [DISCONTINUED] loratadine (CLARITIN) 10 MG tablet Take 10 mg by mouth as needed.         Allergies  Allergen Reactions  . Celecoxib     REACTION: [place]  . Cephalexin     REACTION: trash and swelling  . Ciprofloxacin     REACTION: u/k  . Clopidogrel Bisulfate     REACTION: swelling, rash  . Codeine Phosphate     REACTION: unspecified  . Ezetimibe-Simvastatin     REACTION: myalgias  . Hydrocod Polst-Cpm Polst Er     REACTION: u/k  . Nitrofurantoin     REACTION:  [place]  . Pregabalin     REACTION: felt bad  . Propoxyphene-Acetaminophen     REACTION: [place]  . Rofecoxib   . Shellfish Allergy   . Sulfamethoxazole     REACTION: unspecified  . Sulfonamide Derivatives     REACTION: unspecified    Past Medical History  Diagnosis Date  . GERD (gastroesophageal reflux disease)   . CAD (coronary artery disease)   . Urticaria   . Hyperlipidemia   . Hypertension   . Non Hodgkin's lymphoma   . Allergy     Cipro, Plavix, Statins  . Personal history of colonic polyps   . Diabetes mellitus     Diagnosed 2012  . nhl dx'd 2003    chemo/xrt comp 2003  . Proctitis     Past Surgical History  Procedure Date  . Vaginal hysterectomy     partial, fibroids, one ovary left  . Knee surgery 10/2003    left  . Coronary angioplasty with stent placement     x 2  . Dexa-neg   . Cardiac cath-neg   . Laser surgery for cataracts-left   . Stress cardiolite     Family History  Problem Relation Age of Onset  . Cancer Mother     jaw  . Hypertension Father   . Heart attack  Father   . Colon cancer Neg Hx   . Heart attack Sister     History   Social History  . Marital Status: Married    Spouse Name: N/A    Number of Children: N/A  . Years of Education: N/A   Occupational History  . Retired    Social History Main Topics  . Smoking status: Never Smoker   . Smokeless tobacco: Never Used  . Alcohol Use: No  . Drug Use: No  . Sexually Active: Not on file   Other Topics Concern  . Not on file   Social History Narrative  . No narrative on file   Review of Systems No nausea or vomiting Appetite is fine     Objective:   Physical Exam  BP 124/80  Pulse 64  Temp 98 F (36.7 C)  Wt 169 lb (76.658 kg)  Constitutional: She appears well-developed and well-nourished. No distress.  Abdominal: Soft. She exhibits no distension. There is no tenderness. There is no rebound and no guarding.  Musculoskeletal:       No CVA tenderness  Left trochanteric bursa TTP, no pain with external or internal rotation of hip.      Assessment & Plan:   1. Frequent urination  UA s/p abx.  Will send for cx to verify no infection.  Advised pt to get established with another urologist. The patient indicates understanding of these issues and agrees with the plan.  POCT urinalysis dipstick  2. Left hip pain  Likely bursitis- will check hip xray today.  Likely refer to Dr. Patsy Lager for hip injections. DG Hip Complete Left  3. Diabetes mellitus  Stable.  Recheck a1c and CMET today. Hemoglobin A1c, Comprehensive metabolic panel

## 2012-10-02 NOTE — Patient Instructions (Addendum)
Good to see you. We will call you with your xray results.  Your urine looked good- we will send it for culture and call you with your results.  I would touch base with urology.

## 2012-10-02 NOTE — Addendum Note (Signed)
Addended by: Eliezer Bottom on: 10/02/2012 08:49 AM   Modules accepted: Orders

## 2012-10-04 LAB — URINE CULTURE

## 2012-10-05 ENCOUNTER — Other Ambulatory Visit (INDEPENDENT_AMBULATORY_CARE_PROVIDER_SITE_OTHER): Payer: Medicare Other

## 2012-10-05 DIAGNOSIS — N289 Disorder of kidney and ureter, unspecified: Secondary | ICD-10-CM

## 2012-10-05 LAB — BASIC METABOLIC PANEL
BUN: 19 mg/dL (ref 6–23)
CO2: 29 mEq/L (ref 19–32)
Chloride: 103 mEq/L (ref 96–112)
Creatinine, Ser: 1.2 mg/dL (ref 0.4–1.2)

## 2012-10-11 ENCOUNTER — Encounter: Payer: Self-pay | Admitting: Family Medicine

## 2012-10-11 ENCOUNTER — Ambulatory Visit (INDEPENDENT_AMBULATORY_CARE_PROVIDER_SITE_OTHER): Payer: Medicare Other | Admitting: Family Medicine

## 2012-10-11 ENCOUNTER — Other Ambulatory Visit: Payer: Self-pay | Admitting: Cardiovascular Disease

## 2012-10-11 VITALS — BP 140/74 | HR 74 | Temp 98.2°F | Ht 69.0 in | Wt 172.0 lb

## 2012-10-11 DIAGNOSIS — M76899 Other specified enthesopathies of unspecified lower limb, excluding foot: Secondary | ICD-10-CM

## 2012-10-11 DIAGNOSIS — M7060 Trochanteric bursitis, unspecified hip: Secondary | ICD-10-CM

## 2012-10-11 NOTE — Progress Notes (Signed)
Nature conservation officer at [facility] [address] [place] [address] Phone: [phone] Fax: [phone]  Date:  10/11/2012   Name:  [name] [name]   DOB:  24-Jun-1932   MRN:  [id number] Gender: female Age: 78 y.o.  PCP:  Ruthe Mannan, MD  Evaluating MD: Hannah Beat, MD   Chief Complaint: Hip Pain   History of Present Illness:  [name] [name] is a 78 y.o. pleasant patient who presents with the following:  Saw Dr. Dayton Martes last week, had some foot surgery, and put a lot of pressure on left side after her right bunionectomy. Had been walking differently, got better, but then has been flared up, but will hurt a lot with putting some weight on it.  Will have some R LBP, too. Pain is all lateral hip.  Went to [facility] last week, and with walking, felt OK, but with standing still it will get worse.   Reatha Harps, cards - occ chest pain.   Patient Active Problem List  Diagnosis  . 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  . LYMPHOMA, MALT  . HYPERCHOLESTEROLEMIA  . MIXED HYPERLIPIDEMIA  . DETACHED RETINA  . LABILE HYPERTENSION  . 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 CORONARY ARTERY DISEASE  . CAD, NATIVE VESSEL  . MITRAL VALVE PROLAPSE  . EXTERNAL HEMORRHOIDS  . GERD  . COLITIS  . DIVERTICULOSIS, COLON  . IBS  . UTI  . BREAST CYSTS, BILATERAL  . URTICARIA, IDIOPATHIC  . UNSPECIFIED DISORDER OF SKIN&SUBCUTANEOUS TISSUE  . BACK PAIN, THORACIC REGION  . LOW BACK PAIN, CHRONIC  . OSTEOPENIA  . VERTIGO  . OTHER MALAISE AND FATIGUE  . FACIAL PAIN  . HEMOPTYSIS  . DIARRHEA  . LUMBAR SPRAIN AND STRAIN  . BACK PAIN, ACUTE  . Glucosuria  . Diabetes mellitus  . Renal insufficiency  . BRBPR (bright red blood per rectum)  . External hemorrhoid, bleeding  . Abdominal pain, chronic, epigastric  . Acute cystitis  . Left hip pain    Past Medical History  Diagnosis Date  . GERD (gastroesophageal reflux disease)   . CAD (coronary artery disease)   .  Urticaria   . Hyperlipidemia   . Hypertension   . Non Hodgkin's lymphoma   . Allergy     Cipro, Plavix, Statins  . Personal history of colonic polyps   . Diabetes mellitus     Diagnosed 2012  . nhl dx'd 2003    chemo/xrt comp 2003  . Proctitis     Past Surgical History  Procedure Date  . Vaginal hysterectomy     partial, fibroids, one ovary left  . Knee surgery 10/2003    left  . Coronary angioplasty with stent placement     x 2  . Dexa-neg   . Cardiac cath-neg   . Laser surgery for cataracts-left   . Stress cardiolite     History  Substance Use Topics  . Smoking status: Never Smoker   . Smokeless tobacco: Never Used  . Alcohol Use: No    Family History  Problem Relation Age of Onset  . Cancer Mother     jaw  . Hypertension Father   . Heart attack Father   . Colon cancer Neg Hx   . Heart attack Sister     Allergies  Allergen Reactions  . Celecoxib     REACTION: [place]  . Cephalexin     REACTION: trash and swelling  . Ciprofloxacin  REACTION: u/k  . Clopidogrel Bisulfate     REACTION: swelling, rash  . Codeine Phosphate     REACTION: unspecified  . Ezetimibe-Simvastatin     REACTION: myalgias  . Hydrocod Polst-Cpm Polst Er     REACTION: u/k  . Nitrofurantoin     REACTION: [place]  . Pregabalin     REACTION: felt bad  . Propoxyphene-Acetaminophen     REACTION: [place]  . Rofecoxib   . Shellfish Allergy   . Sulfamethoxazole     REACTION: unspecified  . Sulfonamide Derivatives     REACTION: unspecified    Medication list has been reviewed and updated.  Outpatient Prescriptions Prior to Visit  Medication Sig Dispense Refill  . aspirin (ASPIRIN LOW DOSE) 81 MG tablet Take 81 mg by mouth daily.        Marland Kitchen BAYER CONTOUR TEST test strip USE TO CHECK BLOOD SUGAR TWICE A DAY  100 each  5  . cetirizine (ZYRTEC) 10 MG tablet Take 10 mg by mouth as needed.       . ezetimibe (ZETIA) 10 MG tablet Take 1 tablet (10 mg total) by mouth daily.  90 tablet  3  .  glipiZIDE (GLUCOTROL) 5 MG tablet Take 1 tablet (5 mg total) by mouth once.  30 tablet  3  . ibuprofen (ADVIL,MOTRIN) 200 MG tablet Take 200 mg by mouth as needed.        Marland Kitchen losartan (COZAAR) 50 MG tablet Take 1 tablet (50 mg total) by mouth daily.  30 tablet  6  . meclizine (ANTIVERT) 25 MG tablet Take 25 mg by mouth every 8 (eight) hours.        . metoprolol succinate (TOPROL-XL) 100 MG 24 hr tablet Take one tablet by mouth in the morning.  30 tablet  11  . MICROLET LANCETS MISC USE WITH BAYER CONTOUR METER  TEST BLOOD SUGAR TWICE DAILY  100 each  5  . Multiple Vitamins-Minerals (VISION FORMULA) TABS Take 1 tablet by mouth daily.        Marland Kitchen PREVACID 24HR 15 MG capsule TAKE ONE CAPSULE BY MOUTH ONE TIME DAILY  30 capsule  5  . Calcium Carbonate-Vitamin D (CALCIUM-VITAMIN D) 500-200 MG-UNIT per tablet Take 1 tablet by mouth 2 (two) times daily with a meal.         Last reviewed on 10/11/2012  9:05 AM by Consuello Masse, CMA  Review of Systems:   GEN: No fevers, chills. Nontoxic. Primarily MSK c/o today. MSK: Detailed in the HPI GI: tolerating PO intake without difficulty Neuro: No numbness, parasthesias, or tingling associated. Otherwise the pertinent positives of the ROS are noted above.    Physical Examination: Filed Vitals:   10/11/12 0904  BP: 140/74  Pulse: 74  Temp: 98.2 F (36.8 C)  TempSrc: Oral  Height: 5\' 9"  (1.753 m)  Weight: 172 lb (78.019 kg)  SpO2: 97%    Body mass index is 25.40 kg/(m^2). Ideal Body Weight: Weight in (lb) to have BMI = 25: 168.9    GEN: WDWN, NAD, Non-toxic, Alert & Oriented x 3 HEENT: Atraumatic, Normocephalic.  Ears and Nose: No external deformity. EXTR: No clubbing/cyanosis/edema NEURO: Normal gait.  PSYCH: Normally interactive. Conversant. Not depressed or anxious appearing.  Calm demeanor.   HIP EXAM: SIDE: B ROM: Abduction, Flexion, Internal and External range of motion: full Pain with terminal IROM and EROM: none GTB: L GTB  tender SLR: NEG Knees: No effusion FABER: NT REVERSE FABER: NT, neg Piriformis: NT  at direct palpation Str: flexion: 5/5 abduction: 4/5 on L adduction: 5/5 Strength testing non-tender     Assessment and Plan:  1. Trochanteric bursitis    Classic GTB, likely from altered gait post-op. Some abductor weakness Rehab reviewed  Trochanteric Bursitis Injection, L Verbal consent obtained. Risks (including infection, potential atrophy), benefits, and alternatives reviewed. Greater trochanter sterilely prepped with Chloraprep. Ethyl Chloride used for anesthesia. 8 cc of Lidocaine 1% injected with 2 cc of 40 mg Depo-Medrol into trochanteric bursa at area of maximal tenderness at greater trochanter. Needle taken to bone to troch bursa, flows easily. Bursa massaged. No bleeding and no complications. Decreased pain after injection. Needle: 22 gauge spinal needle   Orders Today:  No orders of the defined types were placed in this encounter.    Updated Medication List: (Includes new medications, updates to list, dose adjustments) No orders of the defined types were placed in this encounter.    Medications Discontinued: There are no discontinued medications.   Hannah Beat, MD

## 2012-11-01 ENCOUNTER — Other Ambulatory Visit: Payer: Self-pay | Admitting: Family Medicine

## 2012-11-29 ENCOUNTER — Other Ambulatory Visit: Payer: Self-pay | Admitting: Cardiovascular Disease

## 2012-11-30 NOTE — Telephone Encounter (Signed)
Called pharmacy and they stated patient does have more refills left and It was an error.

## 2012-12-19 ENCOUNTER — Telehealth: Payer: Self-pay | Admitting: *Deleted

## 2012-12-19 NOTE — Telephone Encounter (Signed)
Pt states that prevacid is no longer covered by her insurance and she is asking if generic can be called to [facility].

## 2012-12-20 MED ORDER — LANSOPRAZOLE 15 MG PO TBDP: 15.0000 mg | ORAL_TABLET | Freq: Every day | ORAL | Status: DC

## 2012-12-20 NOTE — Telephone Encounter (Signed)
Rx sent 

## 2012-12-21 ENCOUNTER — Telehealth: Payer: Self-pay | Admitting: Family Medicine

## 2012-12-21 NOTE — Telephone Encounter (Signed)
[name] [name], please find out what they will cover and ok to order 30 day supply with 3 refills.

## 2012-12-21 NOTE — Telephone Encounter (Signed)
Patient states she told by her [employer] they would no longer cover her prevacid.  She called the office and Dr. Lenward Chancellor in a Rx for  Prevacid Solutab 15mg  to her [facility] pharmacy [phone].  When she went to pick up the medication it is going to cost her $64.00.  She cannot afford that.  OTC will cost her $30.00 for 20 days supply and she cannot afford that either.  The pharmacist at [facility] told Ms. Corl that they make a "generic prevacid" that she would need a Rx for .  Ms. Wehner is asking that office contact the pharmacy and order the Generic prevacid that she can afford.  Please contact patient. [phone]

## 2012-12-22 ENCOUNTER — Other Ambulatory Visit: Payer: Self-pay | Admitting: *Deleted

## 2012-12-22 MED ORDER — LANSOPRAZOLE 15 MG PO CPDR: 15.0000 mg | DELAYED_RELEASE_CAPSULE | Freq: Every day | ORAL | Status: DC

## 2012-12-29 ENCOUNTER — Other Ambulatory Visit: Payer: Self-pay | Admitting: Cardiovascular Disease

## 2013-02-20 ENCOUNTER — Other Ambulatory Visit: Payer: Self-pay | Admitting: Family Medicine

## 2013-02-22 ENCOUNTER — Other Ambulatory Visit: Payer: Self-pay | Admitting: Cardiovascular Disease

## 2013-02-23 ENCOUNTER — Other Ambulatory Visit: Payer: Self-pay | Admitting: Family Medicine

## 2013-02-26 ENCOUNTER — Other Ambulatory Visit: Payer: Self-pay | Admitting: *Deleted

## 2013-02-26 MED ORDER — EZETIMIBE 10 MG PO TABS: ORAL_TABLET | ORAL | Status: DC

## 2013-02-27 ENCOUNTER — Encounter: Payer: Self-pay | Admitting: Family Medicine

## 2013-02-27 ENCOUNTER — Ambulatory Visit (INDEPENDENT_AMBULATORY_CARE_PROVIDER_SITE_OTHER): Payer: Medicare Other | Admitting: Family Medicine

## 2013-02-27 ENCOUNTER — Ambulatory Visit (INDEPENDENT_AMBULATORY_CARE_PROVIDER_SITE_OTHER)
Admission: RE | Admit: 2013-02-27 | Discharge: 2013-02-27 | Disposition: A | Discharge status: Home / Self Care | Payer: Medicare Other | Source: Ambulatory Visit | Attending: Family Medicine | Admitting: Family Medicine

## 2013-02-27 VITALS — BP 140/72 | HR 68 | Temp 98.0°F | Wt 174.0 lb

## 2013-02-27 DIAGNOSIS — M546 Pain in thoracic spine: Secondary | ICD-10-CM

## 2013-02-27 DIAGNOSIS — Z136 Encounter for screening for cardiovascular disorders: Secondary | ICD-10-CM

## 2013-02-27 DIAGNOSIS — E119 Type 2 diabetes mellitus without complications: Secondary | ICD-10-CM

## 2013-02-27 LAB — COMPREHENSIVE METABOLIC PANEL
ALT: 28 U/L (ref 0–35)
AST: 24 U/L (ref 0–37)
Albumin: 4.1 g/dL (ref 3.5–5.2)
Alkaline Phosphatase: 61 U/L (ref 39–117)
Calcium: 9.3 mg/dL (ref 8.4–10.5)
Chloride: 104 mEq/L (ref 96–112)
Potassium: 4.6 mEq/L (ref 3.5–5.1)

## 2013-02-27 LAB — LIPID PANEL
HDL: 38.8 mg/dL — ABNORMAL LOW (ref >39.00)
Total CHOL/HDL Ratio: 5
Triglycerides: 270 mg/dL — ABNORMAL HIGH (ref 0.0–149.0)

## 2013-02-27 LAB — LDL CHOLESTEROL, DIRECT: Direct LDL: 102.5 mg/dL

## 2013-02-27 LAB — HEMOGLOBIN A1C: Hgb A1c MFr Bld: 6 % (ref 4.6–6.5)

## 2013-02-27 MED ORDER — CYCLOBENZAPRINE HCL 5 MG PO TABS: 5.0000 mg | ORAL_TABLET | Freq: Two times a day (BID) | ORAL | Status: DC | PRN

## 2013-02-27 NOTE — Patient Instructions (Addendum)
Good to see you. I think is a muscle strain. We are getting an xray today.  I will call you with your results. Take flexeril 5 mg nightly for next 3 nights, continue using heating pad.

## 2013-02-27 NOTE — Progress Notes (Signed)
Subjective:    Patient ID: [name] [name], female    DOB: 05-19-1932, 79 y.o.   MRN: [id number]  HPI 79 yo very pleasant female with h/o well controlled DM here for:  1.  Pain inbetween her shoulder blades for over a week.  Was sleeping on her right side, woke up and immediately felt this pain.  Worsened with flexion of her neck and sore with direct palpation.  Heat and ice have not helped much.  Can take her breath away when she feels it but no CP or SOB otherwise.  No known injury.  No fall.    1.  DM- checks her CBGs every other day, typically runs between 100-120.  On Glipizide 5 mg daily.  Denies any episodes of hypoglycemia.  Lab Results  Component Value Date   HGBA1C 5.9 10/02/2012     Current Outpatient Prescriptions on File Prior to Visit  Medication Sig Dispense Refill  . aspirin (ASPIRIN LOW DOSE) 81 MG tablet Take 81 mg by mouth daily.        Marland Kitchen BAYER CONTOUR TEST test strip USE TO CHECK BLOOD SUGAR TWICE A DAY  100 each  5  . BAYER CONTOUR TEST test strip USE TO CHECK BLOOD SUGAR TWICE A DAY  100 each  5  . Calcium Carbonate-Vitamin D (CALCIUM-VITAMIN D) 500-200 MG-UNIT per tablet Take 1 tablet by mouth 2 (two) times daily with a meal.        . cetirizine (ZYRTEC) 10 MG tablet Take 10 mg by mouth as needed.       . ezetimibe (ZETIA) 10 MG tablet TAKE 1 TABLET DAILY  30 tablet  3  . glipiZIDE (GLUCOTROL) 5 MG tablet TAKE ONE TABLET BY MOUTH TWICE DAILY  60 tablet  5  . ibuprofen (ADVIL,MOTRIN) 200 MG tablet Take 200 mg by mouth as needed.        . lansoprazole (PREVACID) 15 MG capsule Take 1 capsule (15 mg total) by mouth daily.  30 capsule  11  . losartan (COZAAR) 50 MG tablet TAKE ONE TABLET BY MOUTH ONE TIME DAILY  30 tablet  3  . meclizine (ANTIVERT) 25 MG tablet Take 25 mg by mouth every 8 (eight) hours.        . metoprolol succinate (TOPROL-XL) 100 MG 24 hr tablet Take one tablet by mouth in the morning.  30 tablet  11  . MICROLET LANCETS MISC USE WITH BAYER CONTOUR  METER  TEST BLOOD SUGAR TWICE DAILY  100 each  5  . Multiple Vitamins-Minerals (VISION FORMULA) TABS Take 1 tablet by mouth daily.        . [DISCONTINUED] loratadine (CLARITIN) 10 MG tablet Take 10 mg by mouth as needed.        No current facility-administered medications on file prior to visit.    Allergies  Allergen Reactions  . Celecoxib     REACTION: [place]  . Cephalexin     REACTION: trash and swelling  . Ciprofloxacin     REACTION: u/k  . Clopidogrel Bisulfate     REACTION: swelling, rash  . Codeine Phosphate     REACTION: unspecified  . Ezetimibe-Simvastatin     REACTION: myalgias  . Hydrocod Polst-Cpm Polst Er     REACTION: u/k  . Nitrofurantoin     REACTION: [place]  . Pregabalin     REACTION: felt bad  . Propoxyphene-Acetaminophen     REACTION: [place]  . Rofecoxib   . Shellfish Allergy   .  Sulfamethoxazole     REACTION: unspecified  . Sulfonamide Derivatives     REACTION: unspecified    Past Medical History  Diagnosis Date  . GERD (gastroesophageal reflux disease)   . CAD (coronary artery disease)   . Urticaria   . Hyperlipidemia   . Hypertension   . Non Hodgkin's lymphoma   . Allergy     Cipro, Plavix, Statins  . Personal history of colonic polyps   . Diabetes mellitus     Diagnosed 2012  . nhl dx'd 2003    chemo/xrt comp 2003  . Proctitis     Past Surgical History  Procedure Laterality Date  . Vaginal hysterectomy      partial, fibroids, one ovary left  . Knee surgery  10/2003    left  . Coronary angioplasty with stent placement      x 2  . Dexa-neg    . Cardiac cath-neg    . Laser surgery for cataracts-left    . Stress cardiolite      Family History  Problem Relation Age of Onset  . Cancer Mother     jaw  . Hypertension Father   . Heart attack Father   . Colon cancer Neg Hx   . Heart attack Sister     History   Social History  . Marital Status: Married    Spouse Name: N/A    Number of Children: N/A  . Years of Education: N/A    Occupational History  . Retired    Social History Main Topics  . Smoking status: Never Smoker   . Smokeless tobacco: Never Used  . Alcohol Use: No  . Drug Use: No  . Sexually Active: Not on file   Other Topics Concern  . Not on file   Social History Narrative  . No narrative on file   Review of Systems No nausea or vomiting Appetite is fine     Objective:   Physical Exam  BP 140/72  Pulse 68  Temp(Src) 98 F (36.7 C)  Wt 174 lb (78.926 kg)  BMI 25.68 kg/m2  Constitutional: She appears well-developed and well-nourished. No distress.  Abdominal: Soft. She exhibits no distension. There is no tenderness. There is no rebound and no guarding.  Musculoskeletal: TTP over T3/T4, pain is elicited with neck flexion and extension.    Assessment & Plan:    1. BACK PAIN, THORACIC REGION New- I suspect muscle strain.  Since she is TTP over spine, will get xray to rule out compression fracture.  The patient indicates understanding of these issues and agrees with the plan.  - DG Thoracic Spine W/Swimmers; Future  2. Diabetes mellitus Well controlled on low dose glipizide. - Hemoglobin A1c - Comprehensive metabolic panel  3. Screening for ischemic heart disease  - Lipid Panel

## 2013-04-09 ENCOUNTER — Encounter: Payer: Self-pay | Admitting: Family Medicine

## 2013-04-26 ENCOUNTER — Encounter: Payer: Self-pay | Admitting: *Deleted

## 2013-05-03 ENCOUNTER — Other Ambulatory Visit (INDEPENDENT_AMBULATORY_CARE_PROVIDER_SITE_OTHER): Payer: Medicare PPO

## 2013-05-03 ENCOUNTER — Encounter: Payer: Self-pay | Admitting: Gastroenterology

## 2013-05-03 ENCOUNTER — Ambulatory Visit (INDEPENDENT_AMBULATORY_CARE_PROVIDER_SITE_OTHER): Payer: Medicare PPO | Admitting: Gastroenterology

## 2013-05-03 VITALS — BP 120/60 | HR 60 | Ht 69.0 in | Wt 174.0 lb

## 2013-05-03 DIAGNOSIS — R1011 Right upper quadrant pain: Secondary | ICD-10-CM

## 2013-05-03 DIAGNOSIS — K5730 Diverticulosis of large intestine without perforation or abscess without bleeding: Secondary | ICD-10-CM

## 2013-05-03 DIAGNOSIS — K219 Gastro-esophageal reflux disease without esophagitis: Secondary | ICD-10-CM

## 2013-05-03 DIAGNOSIS — R140 Abdominal distension (gaseous): Secondary | ICD-10-CM

## 2013-05-03 DIAGNOSIS — K589 Irritable bowel syndrome without diarrhea: Secondary | ICD-10-CM

## 2013-05-03 DIAGNOSIS — K5900 Constipation, unspecified: Secondary | ICD-10-CM

## 2013-05-03 DIAGNOSIS — R143 Flatulence: Secondary | ICD-10-CM

## 2013-05-03 LAB — CBC WITH DIFFERENTIAL/PLATELET
Basophils Absolute: 0 10*3/uL (ref 0.0–0.1)
Eosinophils Absolute: 0.1 10*3/uL (ref 0.0–0.7)
Hemoglobin: 14.3 g/dL (ref 12.0–15.0)
Lymphocytes Relative: 47.9 % — ABNORMAL HIGH (ref 12.0–46.0)
Lymphs Abs: 2.4 10*3/uL (ref 0.7–4.0)
MCHC: 33.3 g/dL (ref 30.0–36.0)
Neutro Abs: 2 10*3/uL (ref 1.4–7.7)
Platelets: 206 10*3/uL (ref 150.0–400.0)
RDW: 13.4 % (ref 11.5–14.6)

## 2013-05-03 LAB — LIPASE: Lipase: 34 U/L (ref 11.0–59.0)

## 2013-05-03 LAB — AMYLASE: Amylase: 49 U/L (ref 27–131)

## 2013-05-03 LAB — SEDIMENTATION RATE: Sed Rate: 11 mm/hr (ref 0–22)

## 2013-05-03 NOTE — Patient Instructions (Addendum)
  You have been scheduled for an abdominal ultrasound at [facility] (1st floor of hospital) on 05-07-13 at 8am. Please arrive 15 minutes prior to your appointment for registration. Make certain not to have anything to eat or drink 6 hours prior to your appointment. Should you need to reschedule your appointment, please contact radiology at [phone]. This test typically takes about 30 minutes to perform.  Please purchase Align over the counter(coupon given) and take one capsule by mouth once daily for two weeks. This puts good bacteria back into your colon.  Your physician has requested that you go to the basement for the following lab work before leaving today: CBC Amylase Sedimentation Rate Lipase  Celiac  _______________________________________________________                                               We are excited to introduce MyChart, a new best-in-class service that provides you online access to important information in your electronic medical record. We want to make it easier for you to view your health information - all in one secure location - when and where you need it. We expect MyChart will enhance the quality of care and service we provide.  When you register for MyChart, you can:    View your test results.    Request appointments and receive appointment reminders via email.    Request medication renewals.    View your medical history, allergies, medications and immunizations.    Communicate with your physician's office through a password-protected site.    Conveniently print information such as your medication lists.  To find out if MyChart is right for you, please talk to a member of our clinical staff today. We will gladly answer your questions about this free health and wellness tool.  If you are age 77 or older and want a member of your family to have access to your record, you must provide written consent by completing a proxy form available at  our office. Please speak to our clinical staff about guidelines regarding accounts for patients younger than age 46.  As you activate your MyChart account and need any technical assistance, please call the MyChart technical support line at [phone]) or email your question to [email or web address]. If you email your question(s), please include your name, a return phone number and the best time to reach you.  If you have non-urgent health-related questions, you can send a message to our office through MyChart at [email or web address]. If you have a medical emergency, call 911.  Thank you for using MyChart as your new health and wellness resource!   MyChart licensed from [employer],  [id number]. Patents Pending.

## 2013-05-03 NOTE — Progress Notes (Signed)
This is a 79 year old Caucasian female with chronic IBS.  She has periodic gas, bloating, crampy abdominal pain, also has a history of recurrent kidney stones, but apparently is had recent negative urologic evaluation.  Her acid reflux is well controlled on Prevacid 50 mg a day.  However, she continues to complain of periodic right upper quadrant discomfort, gas, bloating, but no melena or hematochezia.  She does use a fair amount of ibuprofen, has associated constipation and uses Benefiber and MiraLax.  Her appetite is good her weight is stable.  She's been to her previous lymphoma, no longer sees oncology.  Patient also has had hysterectomy, and denies gynecologic problems.  She is up-to-date on her endoscopy and colonoscopy exams.  CT scan of the abdomen one year ago was unremarkable.  The scan did show diverticulosis and degenerative spine disease.  Patient denies a specific food intolerances or any specific hepatobiliary or systemic complaints.  Current Medications, Allergies, Past Medical History, Past Surgical History, Family History and Social History were reviewed in [employer] record.  ROS: All systems were reviewed and are negative unless otherwise stated in the HPI.          Physical Exam: The appearing patient in no distress.  Blood pressure 120/60, pulse 60 and regular, and weight 174 with a BMI of 25.68.  I cannot appreciate stigmata of chronic liver disease.  Her chest is clear, she's in a regular rhythm without murmurs gallops or rubs.  There is no abdominal distention, organomegaly, masses or tenderness.  There is a well-healed large lower abdominal scar noted.  Bowel sounds are active but nonobstructive.  Peripheral extremities are unremarkable.  Mental status is normal.    Assessment and Plan: Diverticulosis coli and irritable bowel syndrome with possible element of bacterial overgrowth.  I have placed her on Align probiotic therapy but have asked her  continue her Benefiber and MiraLax for her chronic functional constipation.  Cause her abdominal pain, we will check followup labs and upper abdominal ultrasound to exclude cholelithiasis.  I do not think she needs followup endoscopy or colonoscopy at this time.  She is to continue her Prevacid for her GERD.  If she continues to be symptomatic, we will consider therapeutic trial of Xifaxan therapy. Encounter Diagnosis  Name Primary?  . RUQ abdominal pain Yes

## 2013-05-04 LAB — CELIAC PANEL 10
Endomysial Screen: NEGATIVE
IgA: 229 mg/dL (ref 69–380)
Tissue Transglutaminase Ab, IgA: 4.2 U/mL (ref <20)

## 2013-05-07 ENCOUNTER — Ambulatory Visit (HOSPITAL_COMMUNITY)
Admission: RE | Admit: 2013-05-07 | Discharge: 2013-05-07 | Disposition: A | Discharge status: Home / Self Care | Payer: Medicare PPO | Source: Ambulatory Visit | Attending: Gastroenterology | Admitting: Gastroenterology

## 2013-05-07 DIAGNOSIS — K7689 Other specified diseases of liver: Secondary | ICD-10-CM | POA: Insufficient documentation

## 2013-05-07 DIAGNOSIS — R109 Unspecified abdominal pain: Secondary | ICD-10-CM | POA: Insufficient documentation

## 2013-05-07 DIAGNOSIS — N281 Cyst of kidney, acquired: Secondary | ICD-10-CM | POA: Insufficient documentation

## 2013-05-07 DIAGNOSIS — Z87442 Personal history of urinary calculi: Secondary | ICD-10-CM | POA: Insufficient documentation

## 2013-06-13 ENCOUNTER — Ambulatory Visit (INDEPENDENT_AMBULATORY_CARE_PROVIDER_SITE_OTHER): Payer: Medicare PPO | Admitting: Family Medicine

## 2013-06-13 ENCOUNTER — Telehealth: Payer: Self-pay

## 2013-06-13 ENCOUNTER — Encounter: Payer: Self-pay | Admitting: Family Medicine

## 2013-06-13 ENCOUNTER — Other Ambulatory Visit: Payer: Self-pay | Admitting: Cardiovascular Disease

## 2013-06-13 VITALS — BP 120/70 | HR 60 | Temp 97.8°F | Ht 69.0 in | Wt 173.0 lb

## 2013-06-13 DIAGNOSIS — R209 Unspecified disturbances of skin sensation: Secondary | ICD-10-CM

## 2013-06-13 DIAGNOSIS — E119 Type 2 diabetes mellitus without complications: Secondary | ICD-10-CM

## 2013-06-13 DIAGNOSIS — M546 Pain in thoracic spine: Secondary | ICD-10-CM

## 2013-06-13 DIAGNOSIS — M2140 Flat foot [pes planus] (acquired), unspecified foot: Secondary | ICD-10-CM

## 2013-06-13 DIAGNOSIS — R208 Other disturbances of skin sensation: Secondary | ICD-10-CM | POA: Insufficient documentation

## 2013-06-13 LAB — MICROALBUMIN / CREATININE URINE RATIO: Microalb, Ur: 0.5 mg/dL (ref 0.0–1.9)

## 2013-06-13 NOTE — Telephone Encounter (Signed)
Please send my office note for today.

## 2013-06-13 NOTE — Progress Notes (Signed)
Subjective:    Patient ID: [name] [name], female    DOB: 07-Aug-1932, 79 y.o.   MRN: [id number]  HPI 79 yo very pleasant female with h/o well controlled DM here for:  1. Persistent back pain.  Initially saw her for this pain in 02/2013.  At that time she complained of pain inbetween her shoulder blades for over a week.  Was sleeping on her right side, woke up and immediately felt this pain.  Worsened with flexion of her neck and sore with direct palpation.    No known injury.  No fall. Xray at that time showed moderate degenerative spondylosis.    Clinical Data: 1-week history of thoracic back pain.  THORACIC SPINE - 2 VIEW + SWIMMERS  Comparison: MRI 03/19/2011.  Findings: There is marginal osteophyte formation representing  degenerative spondylosis. Alignment is within normal limits. No  fracture or bony destruction is evident. Slightly osteopenic  appearance of bones.  IMPRESSION:  Moderate changes of degenerative spondylosis.  Original Report Authenticated By: Onalee Hua Call  Pain has persisted especially with active.  Sees cardiology yearly- recent stress test neg.  No UE weakness or tingling in hands or fingers.  1.  Tingling and burning in her feet bilaterally-  This was her chief complaint however she describes it more as an achy than burning and mainly balls of feet bilaterally. Worse when she is wearing tight shoes or non supportive shoes.  DM has been under good control.   Checks her CBGs every other day, typically runs between 100-120.  On Glipizide 5 mg daily.  Denies any episodes of hypoglycemia.  Lab Results  Component Value Date   HGBA1C 6.0 02/27/2013     Current Outpatient Prescriptions on File Prior to Visit  Medication Sig Dispense Refill  . aspirin (ASPIRIN LOW DOSE) 81 MG tablet Take 81 mg by mouth daily.        Marland Kitchen BAYER CONTOUR TEST test strip USE TO CHECK BLOOD SUGAR TWICE A DAY  100 each  5  . BAYER CONTOUR TEST test strip USE TO CHECK BLOOD SUGAR TWICE A DAY   100 each  5  . Calcium Carbonate-Vitamin D (CALCIUM-VITAMIN D) 500-200 MG-UNIT per tablet Take 1 tablet by mouth 2 (two) times daily with a meal.        . cetirizine (ZYRTEC) 10 MG tablet Take 10 mg by mouth as needed.       . ezetimibe (ZETIA) 10 MG tablet TAKE 1 TABLET DAILY  30 tablet  3  . glipiZIDE (GLUCOTROL) 5 MG tablet TAKE ONE TABLET BY MOUTH TWICE DAILY  60 tablet  5  . ibuprofen (ADVIL,MOTRIN) 200 MG tablet Take 200 mg by mouth as needed.        . lansoprazole (PREVACID) 15 MG capsule Take 1 capsule (15 mg total) by mouth daily.  30 capsule  11  . losartan (COZAAR) 50 MG tablet TAKE ONE TABLET BY MOUTH ONE TIME DAILY  30 tablet  3  . meclizine (ANTIVERT) 25 MG tablet Take 25 mg by mouth every 8 (eight) hours.        . metoprolol succinate (TOPROL-XL) 100 MG 24 hr tablet Take one tablet by mouth in the morning.  30 tablet  11  . MICROLET LANCETS MISC USE WITH BAYER CONTOUR METER  TEST BLOOD SUGAR TWICE DAILY  100 each  5  . Multiple Vitamins-Minerals (VISION FORMULA) TABS Take 1 tablet by mouth daily.        . [DISCONTINUED] loratadine (CLARITIN)  10 MG tablet Take 10 mg by mouth as needed.        No current facility-administered medications on file prior to visit.    Allergies  Allergen Reactions  . Celecoxib     REACTION: [place]  . Cephalexin     REACTION: trash and swelling  . Ciprofloxacin     REACTION: u/k  . Clopidogrel Bisulfate     REACTION: swelling, rash  . Codeine Phosphate     REACTION: unspecified  . Ezetimibe-Simvastatin     REACTION: myalgias  . Hydrocod Polst-Cpm Polst Er     REACTION: u/k  . Nitrofurantoin     REACTION: [place]  . Pregabalin     REACTION: felt bad  . Propoxyphene-Acetaminophen     REACTION: [place]  . Rofecoxib   . Shellfish Allergy   . Sulfamethoxazole     REACTION: unspecified  . Sulfonamide Derivatives     REACTION: unspecified    Past Medical History  Diagnosis Date  . GERD (gastroesophageal reflux disease)   . CAD (coronary artery  disease)   . Urticaria   . Hyperlipidemia   . Hypertension   . Allergy     Cipro, Plavix, Statins  . Personal history of colonic polyps   . Diabetes mellitus     Diagnosed 2012  . NHL (non-Hodgkin's lymphoma) dx'd 2003    chemo/xrt comp 2003  . Proctitis   . Diverticulosis of colon (without mention of hemorrhage)     Past Surgical History  Procedure Laterality Date  . Vaginal hysterectomy      partial, fibroids, one ovary left  . Knee surgery  10/2003    left  . Coronary angioplasty with stent placement      x 2  . Dexa-neg    . Cardiac cath-neg    . Laser surgery for cataracts-left    . Stress cardiolite      Family History  Problem Relation Age of Onset  . Cancer Mother     jaw  . Hypertension Father   . Heart attack Father   . Colon cancer Neg Hx   . Heart attack Sister     History   Social History  . Marital Status: Married    Spouse Name: N/A    Number of Children: N/A  . Years of Education: N/A   Occupational History  . Retired    Social History Main Topics  . Smoking status: Never Smoker   . Smokeless tobacco: Never Used  . Alcohol Use: No  . Drug Use: No  . Sexually Active: Not on file   Other Topics Concern  . Not on file   Social History Narrative  . No narrative on file   Review of Systems No nausea or vomiting Appetite is fine     Objective:   Physical Exam  BP 120/70  Pulse 60  Temp(Src) 97.8 F (36.6 C)  Ht 5\' 9"  (1.753 m)  Wt 173 lb (78.472 kg)  BMI 25.54 kg/m2  Constitutional: She appears well-developed and well-nourished. No distress.  Abdominal: Soft. She exhibits no distension. There is no tenderness. There is no rebound and no guarding.  Musculoskeletal: TTP over T3/T4, pain is elicited with neck flexion and extension.   Diabetic foot exam: Normal inspection-+ pos pes planus No skin breakdown No calluses  Normal DP pulses Normal sensation to light touch and monofilament Nails normal  Assessment & Plan:  1.  BACK PAIN, THORACIC REGION Persistent likely due to moderate degenerative changes.  She defers ortho at this time. Advised Tylenol (no NSAIDS due to CKD and DM).  2. Burning sensation of feet Normal sensation.  This is likely more due to pes planus with arch breakdown.  Advised orthotics. The patient indicates understanding of these issues and agrees with the plan. She will call her insurance company to look into this.

## 2013-06-13 NOTE — Patient Instructions (Addendum)
Good to see you. Let's start taking Tylenol Arthritis- follow directions on bottles. If symptoms worsen and you do decide to see an orthopedist, let me know.  Call you insurance company.  Let them know that you are flat footed (pes planus) and that I would like for you to get custom orthotics.  Dr. Patsy Lager can do those here and you can schedule that at your convenience.

## 2013-06-13 NOTE — Telephone Encounter (Signed)
Pt was seen earlier today; Insurance co said cannot give any information over phone about coverage; Needed written letter from doctor stating why pt needed orthotics and diagnosis mailed to [facility], [address] [address] [place], [place] [address].

## 2013-06-15 NOTE — Telephone Encounter (Signed)
Copy of office note mailed to insurance company.

## 2013-06-19 ENCOUNTER — Encounter: Payer: Self-pay | Admitting: Family Medicine

## 2013-08-03 ENCOUNTER — Telehealth: Payer: Self-pay | Admitting: Family Medicine

## 2013-08-03 NOTE — Telephone Encounter (Signed)
Patient Information:  Caller Name: Kina  Phone: [phone]  Patient: [name] [name], [name] [name]  Gender: Female  DOB: 1932-08-06  Age: 79 Years  PCP: Ruthe Mannan [facility])  Office Follow Up:  Does the office need to follow up with this patient?: No  Instructions For The Office: N/A  RN Note:  Patient was given appt 08/06/13 at 1200 with Dr. Dayton Martes, but told to speak with RN regarding her back pain.  Pain onset into back 07/31/13.  States worse when climbing stairs.  Had an injection in the left hip in the past when this occurred before.  States her pain goes down the back of the left thigh.  Also has pain in mid back, particularly when carrying something heavy.  States pain is between the shoulder blades on the spine; states this is not cardiac pain.  Per back pain protocol, emergent symptoms denied; advised and offered appt within 24 hours at [facility].  Patient declines; states she prefers to be seen at [facility] office on 08/06/13.  krs/can  Symptoms  Reason For Call & Symptoms: pain in back of thigh and up into mid back especially when carrying something  Reviewed Health History In EMR: Yes  Reviewed Medications In EMR: Yes  Reviewed Allergies In EMR: Yes  Reviewed Surgeries / Procedures: Yes  Date of Onset of Symptoms: 07/27/2013  Guideline(s) Used:  Back Pain  Disposition Per Guideline:   See Today or Tomorrow in Office  Reason For Disposition Reached:   Pain radiates into the thigh or further down the leg  Advice Given:  Reassurance:  Twisting or heavy lifting can cause back pain.  With treatment, the pain most often goes away in 1-2 weeks.  You can treat most back pain at home.  Here is some care advice that should help.  Cold or Heat:  Cold Pack: For pain or swelling, use a cold pack or ice wrapped in a wet cloth. Put it on the sore area for 20 minutes. Repeat 4 times on the first day, then as needed.  Activity  Keep doing your day-to-day activities if it is not too painful.  Staying active is better than resting.  Avoid anything that makes your pain worse. Avoid heavy lifting, twisting, and too much exercise until your back heals.  You do not need to stay in bed.  Pain Medicines:  For pain relief, take acetaminophen, ibuprofen, or naproxen.  Use the lowest amount of medicine that makes your pain feel better.  Call Back If:  Numbness or weakness occur  Bowel/bladder problems occur  Pain lasts for more than 2 weeks  You become worse.  Patient Will Follow Care Advice:  YES  Appointment Scheduled:  08/06/2013 12:00:00 Appointment Scheduled Provider:  Ruthe Mannan [facility])

## 2013-08-03 NOTE — Telephone Encounter (Signed)
Noted  Will forward to Dr Dayton Martes

## 2013-08-06 ENCOUNTER — Ambulatory Visit (INDEPENDENT_AMBULATORY_CARE_PROVIDER_SITE_OTHER)
Admission: RE | Admit: 2013-08-06 | Discharge: 2013-08-06 | Disposition: A | Discharge status: Home / Self Care | Payer: Medicare PPO | Source: Ambulatory Visit | Attending: Family Medicine | Admitting: Family Medicine

## 2013-08-06 ENCOUNTER — Encounter: Payer: Self-pay | Admitting: Family Medicine

## 2013-08-06 ENCOUNTER — Ambulatory Visit (INDEPENDENT_AMBULATORY_CARE_PROVIDER_SITE_OTHER): Payer: Medicare PPO | Admitting: Family Medicine

## 2013-08-06 VITALS — BP 158/98 | HR 67 | Temp 98.0°F | Wt 179.0 lb

## 2013-08-06 DIAGNOSIS — R079 Chest pain, unspecified: Secondary | ICD-10-CM

## 2013-08-06 DIAGNOSIS — M25559 Pain in unspecified hip: Secondary | ICD-10-CM

## 2013-08-06 DIAGNOSIS — M25552 Pain in left hip: Secondary | ICD-10-CM

## 2013-08-06 LAB — CBC WITH DIFFERENTIAL/PLATELET
Eosinophils Relative: 1.1 % (ref 0.0–5.0)
HCT: 42.1 % (ref 36.0–46.0)
Hemoglobin: 14.4 g/dL (ref 12.0–15.0)
Lymphs Abs: 2.5 10*3/uL (ref 0.7–4.0)
Monocytes Relative: 11.1 % (ref 3.0–12.0)
Platelets: 222 10*3/uL (ref 150.0–400.0)
RBC: 4.37 Mil/uL (ref 3.87–5.11)
WBC: 5.8 10*3/uL (ref 4.5–10.5)

## 2013-08-06 LAB — LIPASE: Lipase: 35 U/L (ref 11.0–59.0)

## 2013-08-06 NOTE — Progress Notes (Signed)
Subjective:    Patient ID: [name] [name], female    DOB: 03-Oct-1932, 79 y.o.   MRN: [id number]  HPI 79 yo very pleasant female with h/o well controlled DM here for:  1. Persistent back pain.  Initially saw her for this pain in 02/2013.  At that time she complained of pain inbetween her shoulder blades for over a week.  Was sleeping on her right side, woke up and immediately felt this pain.  Worsened with flexion of her neck and sore with direct palpation.    No known injury.  No fall. Xray at that time showed moderate degenerative spondylosis.    Clinical Data: 1-week history of thoracic back pain.  THORACIC SPINE - 2 VIEW + SWIMMERS  Comparison: MRI 03/19/2011.  Findings: There is marginal osteophyte formation representing  degenerative spondylosis. Alignment is within normal limits. No  fracture or bony destruction is evident. Slightly osteopenic  appearance of bones.  IMPRESSION:  Moderate changes of degenerative spondylosis.  Original Report Authenticated By: Onalee Hua Call  Pain has persisted especially with active.  Sees cardiology yearly- recent stress test neg.  No UE weakness or tingling in hands or fingers. Now feels pain radiates to her chest as well.  Has appt to see cardiology on 08/21/2013.  2.  Left hip pain- referred her to Dr. Patsy Lager last year for left trochanteric hip bursitis.  Cortisone injection was helpful but past several weeks, pain has deteriorated.    Current Outpatient Prescriptions on File Prior to Visit  Medication Sig Dispense Refill  . aspirin (ASPIRIN LOW DOSE) 81 MG tablet Take 81 mg by mouth daily.        Marland Kitchen BAYER CONTOUR TEST test strip USE TO CHECK BLOOD SUGAR TWICE A DAY  100 each  5  . BAYER CONTOUR TEST test strip USE TO CHECK BLOOD SUGAR TWICE A DAY  100 each  5  . cetirizine (ZYRTEC) 10 MG tablet Take 10 mg by mouth as needed.       . ezetimibe (ZETIA) 10 MG tablet TAKE 1 TABLET DAILY  30 tablet  3  . glipiZIDE (GLUCOTROL) 5 MG tablet TAKE ONE  TABLET BY MOUTH TWICE DAILY  60 tablet  5  . ibuprofen (ADVIL,MOTRIN) 200 MG tablet Take 200 mg by mouth as needed.        . lansoprazole (PREVACID) 15 MG capsule Take 1 capsule (15 mg total) by mouth daily.  30 capsule  11  . losartan (COZAAR) 50 MG tablet TAKE ONE TABLET BY MOUTH ONE TIME DAILY  30 tablet  3  . meclizine (ANTIVERT) 25 MG tablet Take 25 mg by mouth every 8 (eight) hours.        . metoprolol succinate (TOPROL-XL) 100 MG 24 hr tablet Take one tablet by mouth in the morning.  30 tablet  11  . MICROLET LANCETS MISC USE WITH BAYER CONTOUR METER  TEST BLOOD SUGAR TWICE DAILY  100 each  5  . Multiple Vitamins-Minerals (VISION FORMULA) TABS Take 1 tablet by mouth daily.        . [DISCONTINUED] loratadine (CLARITIN) 10 MG tablet Take 10 mg by mouth as needed.        No current facility-administered medications on file prior to visit.    Allergies  Allergen Reactions  . Celecoxib     REACTION: [place]  . Cephalexin     REACTION: trash and swelling  . Ciprofloxacin     REACTION: u/k  . Clopidogrel Bisulfate  REACTION: swelling, rash  . Codeine Phosphate     REACTION: unspecified  . Ezetimibe-Simvastatin     REACTION: myalgias  . Hydrocod Polst-Cpm Polst Er     REACTION: u/k  . Nitrofurantoin     REACTION: [place]  . Pregabalin     REACTION: felt bad  . Propoxyphene-Acetaminophen     REACTION: [place]  . Rofecoxib   . Shellfish Allergy   . Sulfamethoxazole     REACTION: unspecified  . Sulfonamide Derivatives     REACTION: unspecified    Past Medical History  Diagnosis Date  . GERD (gastroesophageal reflux disease)   . CAD (coronary artery disease)   . Urticaria   . Hyperlipidemia   . Hypertension   . Allergy     Cipro, Plavix, Statins  . Personal history of colonic polyps   . Diabetes mellitus     Diagnosed 2012  . NHL (non-Hodgkin's lymphoma) dx'd 2003    chemo/xrt comp 2003  . Proctitis   . Diverticulosis of colon (without mention of hemorrhage)     Past  Surgical History  Procedure Laterality Date  . Vaginal hysterectomy      partial, fibroids, one ovary left  . Knee surgery  10/2003    left  . Coronary angioplasty with stent placement      x 2  . Dexa-neg    . Cardiac cath-neg    . Laser surgery for cataracts-left    . Stress cardiolite      Family History  Problem Relation Age of Onset  . Cancer Mother     jaw  . Hypertension Father   . Heart attack Father   . Colon cancer Neg Hx   . Heart attack Sister     History   Social History  . Marital Status: Married    Spouse Name: N/A    Number of Children: N/A  . Years of Education: N/A   Occupational History  . Retired    Social History Main Topics  . Smoking status: Never Smoker   . Smokeless tobacco: Never Used  . Alcohol Use: No  . Drug Use: No  . Sexual Activity: Not on file   Other Topics Concern  . Not on file   Social History Narrative  . No narrative on file   Review of Systems No nausea or vomiting Appetite is fine     Objective:   Physical Exam  BP 158/98  Pulse 67  Temp(Src) 98 F (36.7 C)  Wt 179 lb (81.194 kg)  BMI 26.42 kg/m2   General:  Well-developed,well-nourished,in no acute distress; alert,appropriate and cooperative throughout examination Head:  normocephalic and atraumatic.   Mouth:  good dentition.   Neck:  No deformities, masses, or tenderness noted. Lungs:  Normal respiratory effort, chest expands symmetrically. Lungs are clear to auscultation, no crackles or wheezes. Heart:  Normal rate and regular rhythm. S1 and S2 normal without gallop, murmur, click, rub or other extra sounds. Skin:  Intact without suspicious lesions or rashes Psych:  Cognition and judgment appear intact. Alert and cooperative with normal attention span and concentration. No apparent delusions, illusions, hallucinations  Assessment & Plan:   1. Chest pain, unspecified Deteriorated.  Ongoing for over a year.  Likely due to her significant degenerative  changes in her spine (almost always originates in her back), but there are some symptoms that are concerning, specifically pain that is worsened by exertion and relieved by rest.  Does have cards appt scheduled for week  after next.  EKG does not show any major changes from prior- I will send to her cardiologist as well. CXR and lab work today. - Lipase - DG Chest 2 View; Future - CBC with Differential - EKG 12-Lead  2. Left hip pain Not examined today- probable trochanteric bursitis, will refer back to Dr. Patsy Lager to see if repeat injections would be appropriate.

## 2013-08-06 NOTE — Patient Instructions (Addendum)
Good to see you.  I will call you with your xray and lab results. I will also forward your EKG to your cardiologist.  Please make an appointment to see Dr. Patsy Lager for your hip pain on the way out.

## 2013-08-07 ENCOUNTER — Other Ambulatory Visit: Payer: Self-pay | Admitting: Family Medicine

## 2013-08-07 DIAGNOSIS — R1013 Epigastric pain: Secondary | ICD-10-CM

## 2013-08-07 DIAGNOSIS — R079 Chest pain, unspecified: Secondary | ICD-10-CM

## 2013-08-08 ENCOUNTER — Ambulatory Visit (INDEPENDENT_AMBULATORY_CARE_PROVIDER_SITE_OTHER): Payer: Medicare PPO | Admitting: Family Medicine

## 2013-08-08 ENCOUNTER — Other Ambulatory Visit: Payer: Self-pay | Admitting: Cardiovascular Disease

## 2013-08-08 ENCOUNTER — Encounter: Payer: Self-pay | Admitting: Family Medicine

## 2013-08-08 VITALS — BP 142/80 | HR 62 | Temp 98.0°F | Ht 69.0 in | Wt 178.0 lb

## 2013-08-08 DIAGNOSIS — M7711 Lateral epicondylitis, right elbow: Secondary | ICD-10-CM

## 2013-08-08 DIAGNOSIS — M7710 Lateral epicondylitis, unspecified elbow: Secondary | ICD-10-CM

## 2013-08-08 DIAGNOSIS — M76899 Other specified enthesopathies of unspecified lower limb, excluding foot: Secondary | ICD-10-CM

## 2013-08-08 DIAGNOSIS — M7062 Trochanteric bursitis, left hip: Secondary | ICD-10-CM

## 2013-08-08 NOTE — Progress Notes (Signed)
Nature conservation officer at [facility] [address] [place] [address] Phone: [phone] Fax: [phone]  Date:  08/08/2013   Name:  [name] [name]   DOB:  1932-01-22   MRN:  [id number] Gender: female Age: 79 y.o.  Primary Physician:  Ruthe Mannan, MD  Evaluating MD: Hannah Beat, MD  Chief Complaint: Hip Pain and Elbow Pain   History of Present Illness:  [name] [name] is a 79 y.o. very pleasant female patient who presents with the following:  Going to [place], [place], then [place].   Hip When walking pain is lateral on the left. And the right elbow is also bothering her. I saw her about a year ago with GTB on the left, injected her hip, and she had good relief of symptoms for close to a year. Multiple med probs reviewed.  Occ back pain and other pains as well.  Patient presents with lateral elbow pain.  Length of symptoms: few months Hand effected: R  Patient describes a dull ache on the lateral elbow. There is some translation in the proximal forearm and in the distal upper arm. It is painful to lift with the hand facing down and to lift with the thumb in an upright position. Supination is painful. Patient points to the lateral epicondyle as the point of maximal tenderness near ECRB. No trauma.   No prior fractures or operative interventions in the effective hand. Prior PT or HEP: none  Denies numbness or tingling.   Past Medical History, Surgical History, Social History, Family History, Problem List, Medications, and Allergies have been reviewed and updated if relevant.  Current Outpatient Prescriptions on File Prior to Visit  Medication Sig Dispense Refill  . aspirin (ASPIRIN LOW DOSE) 81 MG tablet Take 81 mg by mouth daily.        Marland Kitchen BAYER CONTOUR TEST test strip USE TO CHECK BLOOD SUGAR TWICE A DAY  100 each  5  . BAYER CONTOUR TEST test strip USE TO CHECK BLOOD SUGAR TWICE A DAY  100 each  5  . cetirizine (ZYRTEC) 10 MG tablet Take  10 mg by mouth as needed.       . ezetimibe (ZETIA) 10 MG tablet TAKE 1 TABLET DAILY  30 tablet  3  . glipiZIDE (GLUCOTROL) 5 MG tablet TAKE ONE TABLET BY MOUTH TWICE DAILY  60 tablet  5  . ibuprofen (ADVIL,MOTRIN) 200 MG tablet Take 200 mg by mouth as needed.        . lansoprazole (PREVACID) 15 MG capsule Take 1 capsule (15 mg total) by mouth daily.  30 capsule  11  . losartan (COZAAR) 50 MG tablet TAKE ONE TABLET BY MOUTH ONE TIME DAILY  30 tablet  3  . meclizine (ANTIVERT) 25 MG tablet Take 25 mg by mouth every 8 (eight) hours.        Marland Kitchen MICROLET LANCETS MISC USE WITH BAYER CONTOUR METER  TEST BLOOD SUGAR TWICE DAILY  100 each  5  . Multiple Vitamins-Minerals (VISION FORMULA) TABS Take 1 tablet by mouth daily.        . [DISCONTINUED] loratadine (CLARITIN) 10 MG tablet Take 10 mg by mouth as needed.        No current facility-administered medications on file prior to visit.    Review of Systems:  GEN: No fevers, chills. Nontoxic. Primarily MSK c/o today. MSK: Detailed in the HPI GI: tolerating PO intake without difficulty Neuro: No numbness, parasthesias, or  tingling associated. Otherwise the pertinent positives of the ROS are noted above.    Physical Examination: BP 142/80  Pulse 62  Temp(Src) 98 F (36.7 C) (Oral)  Ht 5\' 9"  (1.753 m)  Wt 178 lb (80.74 kg)  BMI 26.27 kg/m2   GEN: WDWN, NAD, Non-toxic, Alert & Oriented x 3 HEENT: Atraumatic, Normocephalic.  Ears and Nose: No external deformity. EXTR: No clubbing/cyanosis/edema NEURO: Normal gait.  PSYCH: Normally interactive. Conversant. Not depressed or anxious appearing.  Calm demeanor.   HIP EXAM: SIDE: L ROM: Abduction, Flexion, Internal and External range of motion: approaching normal Pain with terminal IROM and EROM: minimal GTB: L SLR: NEG Knees: No effusion FABER: NT REVERSE FABER: NT, neg Piriformis: NT at direct palpation Str: flexion: 4/5 abduction: 4-/5 adduction: 5/5 Strength testing  non-tender  Elbow: R Ecchymosis or edema: neg ROM: full flexion, extension, pronation, supination Shoulder ROM: Full Flexion: 5/5 Extension: 5/5, PAINFUL Supination: 5/5, PAINFUL Pronation: 5/5 Wrist ext: 5/5 Wrist flexion: 5/5 No gross bony abnormality Varus and Valgus stress: stable ECRB tenderness: YES, TTP Medial epicondyle: NT Lateral epicondyle, resisted wrist extension from wrist full pronation and flexion: PAINFUL grip: 5/5  sensation intact    Assessment and Plan: Trochanteric bursitis, left  Lateral epicondylitis (tennis elbow), right   Lateral Epicondylitis: Elbow anatomy was reviewed, and tendinopathy was explained.  Pt. given a basic rehab program Series of concentric and eccentric exercises should be done starting with no weight, work up to 1 lb, hammer, etc.  Use counterforce strap if working or using hands. Given one today.  Formal PT would be beneficial. Emphasized stretching an cross-friction massage Emphasized proper palms up lifting biomechanics to unload ECRB  Classic GTB  Trochanteric Bursitis Injection, LEFT Verbal consent obtained. Risks (including infection, potential atrophy), benefits, and alternatives reviewed. Greater trochanter sterilely prepped with Chloraprep. Ethyl Chloride used for anesthesia. 8 cc of Lidocaine 1% injected with 2 cc of 40 mg Depo-Medrol into trochanteric bursa at area of maximal tenderness at greater trochanter. Needle taken to bone to troch bursa, flows easily. Bursa massaged. No bleeding and no complications. Decreased pain after injection. Needle: 22 gauge spinal needle   Signed, Nekeisha Aure T. Jla Reynolds, MD 08/08/2013 3:21 PM

## 2013-08-09 ENCOUNTER — Ambulatory Visit (INDEPENDENT_AMBULATORY_CARE_PROVIDER_SITE_OTHER)
Admission: RE | Admit: 2013-08-09 | Discharge: 2013-08-09 | Disposition: A | Discharge status: Home / Self Care | Payer: Medicare PPO | Source: Ambulatory Visit | Attending: Family Medicine | Admitting: Family Medicine

## 2013-08-09 ENCOUNTER — Ambulatory Visit
Admission: RE | Admit: 2013-08-09 | Discharge: 2013-08-09 | Disposition: A | Discharge status: Home / Self Care | Payer: Medicare PPO | Source: Ambulatory Visit | Attending: Family Medicine | Admitting: Family Medicine

## 2013-08-09 DIAGNOSIS — R1013 Epigastric pain: Secondary | ICD-10-CM

## 2013-08-09 DIAGNOSIS — R079 Chest pain, unspecified: Secondary | ICD-10-CM

## 2013-08-21 ENCOUNTER — Encounter: Payer: Self-pay | Admitting: Cardiovascular Disease

## 2013-08-21 ENCOUNTER — Ambulatory Visit (INDEPENDENT_AMBULATORY_CARE_PROVIDER_SITE_OTHER): Payer: Medicare PPO | Admitting: Cardiovascular Disease

## 2013-08-21 VITALS — BP 144/64 | Ht 69.0 in | Wt 174.1 lb

## 2013-08-21 DIAGNOSIS — I10 Essential (primary) hypertension: Secondary | ICD-10-CM

## 2013-08-21 DIAGNOSIS — R079 Chest pain, unspecified: Secondary | ICD-10-CM

## 2013-08-21 DIAGNOSIS — I2510 Atherosclerotic heart disease of native coronary artery without angina pectoris: Secondary | ICD-10-CM

## 2013-08-21 NOTE — Patient Instructions (Addendum)
Your physician recommends that you schedule a follow-up appointment in: 3-4 weeks.   Your physician has requested that you have an echocardiogram. Echocardiography is a painless test that uses sound waves to create images of your heart. It provides your doctor with information about the size and shape of your heart and how well your heart's chambers and valves are working. This procedure takes approximately one hour. There are no restrictions for this procedure.   Your physician has requested that you have an exercise stress myoview. For further information please visit [email or web address]. Please follow instruction sheet, as given.   

## 2013-08-21 NOTE — Progress Notes (Signed)
History of Present Illness: 79 yo female with history of CAD, HLD, HTN, non-Hodgkin's lymphoma, DM here today for cardiac follow up. She has been followed in the past by Dr. Juanda Chance. In 2009 she had tandem non-overlapping Promus drug-eluting stents placed in the LAD. She was evaluated in 2010 for chest pain and had a negative Myoview scan. She has been intolerant to statins and Plavix in the past. She has been on Zetia on doing well.   She is here for follow up. She has occasional chest pains but these have been present for many years and she says it is unchanged. She does note back pains, starting mid upper back and radiates to the chest and shoulders. Relieved with movement and position changes. No palpitations.   Primary Care Physician: Ruthe Mannan  Last Lipid Profile:Lipid Panel     Component Value Date/Time   CHOL 177 02/27/2013 0854   TRIG 270.0* 02/27/2013 0854   HDL 38.80* 02/27/2013 0854   CHOLHDL 5 02/27/2013 0854   VLDL 54.0* 02/27/2013 0854   LDLCALC 89 08/22/2012 0844     Past Medical History  Diagnosis Date  . GERD (gastroesophageal reflux disease)   . CAD (coronary artery disease)   . Urticaria   . Hyperlipidemia   . Hypertension   . Allergy     Cipro, Plavix, Statins  . Personal history of colonic polyps   . Diabetes mellitus     Diagnosed 2012  . NHL (non-Hodgkin's lymphoma) dx'd 2003    chemo/xrt comp 2003  . Proctitis   . Diverticulosis of colon (without mention of hemorrhage)     Past Surgical History  Procedure Laterality Date  . Vaginal hysterectomy      partial, fibroids, one ovary left  . Knee surgery  10/2003    left  . Coronary angioplasty with stent placement      x 2  . Dexa-neg    . Cardiac cath-neg    . Laser surgery for cataracts-left    . Stress cardiolite      Current Outpatient Prescriptions  Medication Sig Dispense Refill  . aspirin (ASPIRIN LOW DOSE) 81 MG tablet Take 81 mg by mouth daily.        Marland Kitchen BAYER CONTOUR TEST test strip USE TO  CHECK BLOOD SUGAR TWICE A DAY  100 each  5  . cetirizine (ZYRTEC) 10 MG tablet Take 10 mg by mouth as needed.       Marland Kitchen CRANBERRY PO Take 1 tablet by mouth daily.      Marland Kitchen ezetimibe (ZETIA) 10 MG tablet TAKE 1 TABLET DAILY  30 tablet  3  . glipiZIDE (GLUCOTROL) 5 MG tablet TAKE ONE TABLET BY MOUTH TWICE DAILY  60 tablet  5  . ibuprofen (ADVIL,MOTRIN) 200 MG tablet Take 200 mg by mouth as needed.        . lansoprazole (PREVACID) 15 MG capsule Take 1 capsule (15 mg total) by mouth daily.  30 capsule  11  . losartan (COZAAR) 50 MG tablet TAKE ONE TABLET BY MOUTH ONE TIME DAILY  30 tablet  3  . meclizine (ANTIVERT) 25 MG tablet Take 25 mg by mouth every 8 (eight) hours as needed.       . metoprolol succinate (TOPROL-XL) 100 MG 24 hr tablet Take one tablet by mouth every morning  30 tablet  1  . MICROLET LANCETS MISC USE WITH BAYER CONTOUR METER  TEST BLOOD SUGAR TWICE DAILY  100 each  5  . Multiple  Vitamins-Minerals (VISION FORMULA) TABS Take 1 tablet by mouth daily.        . [DISCONTINUED] loratadine (CLARITIN) 10 MG tablet Take 10 mg by mouth as needed.        No current facility-administered medications for this visit.    Allergies  Allergen Reactions  . Celecoxib     REACTION: [place]  . Cephalexin     REACTION: trash and swelling  . Ciprofloxacin     REACTION: u/k  . Clopidogrel Bisulfate     REACTION: swelling, rash  . Codeine Phosphate     REACTION: unspecified  . Ezetimibe-Simvastatin     REACTION: myalgias  . Hydrocod Polst-Cpm Polst Er     REACTION: u/k  . Nitrofurantoin     REACTION: [place]  . Pregabalin     REACTION: felt bad  . Propoxyphene-Acetaminophen     REACTION: [place]  . Rofecoxib   . Shellfish Allergy   . Sulfamethoxazole     REACTION: unspecified  . Sulfonamide Derivatives     REACTION: unspecified    History   Social History  . Marital Status: Married    Spouse Name: N/A    Number of Children: N/A  . Years of Education: N/A   Occupational History  . Retired      Social History Main Topics  . Smoking status: Never Smoker   . Smokeless tobacco: Never Used  . Alcohol Use: No  . Drug Use: No  . Sexual Activity: Not on file   Other Topics Concern  . Not on file   Social History Narrative  . No narrative on file    Family History  Problem Relation Age of Onset  . Cancer Mother     jaw  . Hypertension Father   . Heart attack Father   . Colon cancer Neg Hx   . Heart attack Sister     Review of Systems:  As stated in the HPI and otherwise negative.   BP 144/64  Ht 5\' 9"  (1.753 m)  Wt 174 lb 1.9 oz (78.98 kg)  BMI 25.7 kg/m2  Physical Examination: General: Well developed, well nourished, NAD HEENT: OP clear, mucus membranes moist SKIN: warm, dry. No rashes. Neuro: No focal deficits Musculoskeletal: Muscle strength 5/5 all ext Psychiatric: Mood and affect normal Neck: No JVD, no carotid bruits, no thyromegaly, no lymphadenopathy. Lungs:Clear bilaterally, no wheezes, rhonci, crackles Cardiovascular: Huston Foley, Regular rhythm. No murmurs, gallops or rubs. Abdomen:Soft. Bowel sounds present. Non-tender.  Extremities: No lower extremity edema. Pulses are 2 + in the bilateral DP/PT.  Assessment and Plan:   1. CORONARY ARTERY DISEASE: Recent back pains with radiation to chest. Not associated with exercise. She has had no ischemic evaluation in 4.5 years. Will arrange exercise stress myoview to exclude ischemia. Will arrange echo to exclude structural heart disease.  Continue Zetia, ASA, Toprol. She is intolerant to statins.   2. Hyperlipidemia: Lipids well controlled April 2014 as above. Will continue Zetia. Repeat lipids and LFTs in April 2015.   3. HTN: BP controlled. Continue Cozaar and Toprol.

## 2013-08-22 ENCOUNTER — Other Ambulatory Visit: Payer: Self-pay | Admitting: Cardiovascular Disease

## 2013-08-23 ENCOUNTER — Ambulatory Visit (HOSPITAL_COMMUNITY): Payer: Medicare PPO | Attending: Cardiovascular Disease | Admitting: Radiology

## 2013-08-23 DIAGNOSIS — I10 Essential (primary) hypertension: Secondary | ICD-10-CM

## 2013-08-23 DIAGNOSIS — R072 Precordial pain: Secondary | ICD-10-CM

## 2013-08-23 DIAGNOSIS — I2510 Atherosclerotic heart disease of native coronary artery without angina pectoris: Secondary | ICD-10-CM | POA: Insufficient documentation

## 2013-08-23 DIAGNOSIS — I059 Rheumatic mitral valve disease, unspecified: Secondary | ICD-10-CM | POA: Insufficient documentation

## 2013-08-23 DIAGNOSIS — R079 Chest pain, unspecified: Secondary | ICD-10-CM

## 2013-08-23 NOTE — Progress Notes (Signed)
Echocardiogram performed.  

## 2013-08-30 ENCOUNTER — Encounter: Payer: Self-pay | Admitting: Cardiovascular Disease

## 2013-09-04 ENCOUNTER — Ambulatory Visit (HOSPITAL_COMMUNITY): Payer: Medicare PPO | Attending: Cardiology | Admitting: Radiology

## 2013-09-04 VITALS — BP 132/79 | Ht 69.0 in | Wt 172.0 lb

## 2013-09-04 DIAGNOSIS — I2510 Atherosclerotic heart disease of native coronary artery without angina pectoris: Secondary | ICD-10-CM | POA: Insufficient documentation

## 2013-09-04 DIAGNOSIS — E119 Type 2 diabetes mellitus without complications: Secondary | ICD-10-CM | POA: Insufficient documentation

## 2013-09-04 DIAGNOSIS — R079 Chest pain, unspecified: Secondary | ICD-10-CM

## 2013-09-04 DIAGNOSIS — E785 Hyperlipidemia, unspecified: Secondary | ICD-10-CM | POA: Insufficient documentation

## 2013-09-04 DIAGNOSIS — I10 Essential (primary) hypertension: Secondary | ICD-10-CM | POA: Insufficient documentation

## 2013-09-04 DIAGNOSIS — Z8249 Family history of ischemic heart disease and other diseases of the circulatory system: Secondary | ICD-10-CM | POA: Insufficient documentation

## 2013-09-04 MED ORDER — TECHNETIUM TC 99M SESTAMIBI GENERIC - CARDIOLITE
30.0000 | Freq: Once | INTRAVENOUS | Status: AC | PRN
  Administered 2013-09-04: 30 via INTRAVENOUS

## 2013-09-04 MED ORDER — REGADENOSON 0.4 MG/5ML IV SOLN
0.4000 mg | Freq: Once | INTRAVENOUS | Status: AC
  Administered 2013-09-04: 0.4 mg via INTRAVENOUS

## 2013-09-04 MED ORDER — TECHNETIUM TC 99M SESTAMIBI GENERIC - CARDIOLITE
10.0000 | Freq: Once | INTRAVENOUS | Status: AC | PRN
  Administered 2013-09-04: 10 via INTRAVENOUS

## 2013-09-04 NOTE — Progress Notes (Signed)
[facility] SITE 3 NUCLEAR MED [address] [place], [place] [address] [phone]    Cardiology Nuclear Med Study  [name] [name] is a 79 y.o. female     MRN : [id number]     DOB: 1932-07-03  Procedure Date: 09/04/2013  Nuclear Med Background Indication for Stress Test:  Evaluation for Ischemia and Stent Patency History:  '09 Heart Catheterization>Stent of LADx2;EF=65-70%;'10 MPS:EF=82%,no ischemia;9/14 CT of chest:3 vessel CAD Cardiac Risk Factors: Family History - CAD, Hypertension, Lipids and NIDDM  Symptoms:  Chest Pain   Nuclear Pre-Procedure Caffeine/Decaff Intake:  None NPO After: 8:30pm   Lungs:  clear O2 Sat: 98% on room air. IV 0.9% NS with Angio Cath:  20g  IV Site: R Hand  IV Started by:  Cathlyn Parsons, RN  Chest Size (in):  38 Cup Size: B  Height: 5\' 9"  (1.753 m)  Weight:  172 lb (78.019 kg)  BMI:  Body mass index is 25.39 kg/(m^2). Tech Comments: No Toprol x 24 hrs. Patient was switched to a sitting Lexiscan, she was unable to walk on the treadmill. Aminophylline 75 mg IV given for symptoms. They were all resolved.    Nuclear Med Study 1 or 2 day study: 1 day  Stress Test Type:  Treadmill/Lexiscan  Reading MD: Cassell Clement, MD  Order Authorizing Provider:  Tedra Senegal  Resting Radionuclide: Technetium 3m Sestamibi  Resting Radionuclide Dose: 11.0 mCi   Stress Radionuclide:  Technetium 57m Sestamibi  Stress Radionuclide Dose: 33.0 mCi           Stress Protocol Rest HR: 60 Stress HR: 114  Rest BP: 132/79 Stress BP: 165/78  Exercise Time (min): 7:06 METS: 6.0   Predicted Max HR: 141 bpm % Max HR: 80.85 bpm Rate Pressure Product: 41324   Dose of Adenosine (mg):  n/a Dose of Lexiscan: 0.4 mg  Dose of Atropine (mg): n/a Dose of Dobutamine: n/a mcg/kg/min (at max HR)  Stress Test Technologist: Milana Na, EMT-P  Nuclear Technologist:  Doyne Keel, CNMT     Rest Procedure:  Myocardial perfusion imaging was performed  at rest 45 minutes following the intravenous administration of Technetium 88m Sestamibi. Rest ECG: NSR - Normal EKG  Stress Procedure:  The patient exercised on the treadmill utilizing the Bruce Protocol for 7:06 minutes. The patient stopped due to sob, jaw pain, chest pressure and chest pain.  Technetium 79m Sestamibi was injected at peak exercise and myocardial perfusion imaging was performed after a brief delay. Stress ECG: No significant change from baseline ECG  QPS Raw Data Images:  Normal; no motion artifact; normal heart/lung ratio. Stress Images:  Normal homogeneous uptake in all areas of the myocardium. Rest Images:  Normal homogeneous uptake in all areas of the myocardium. Subtraction (SDS):  No evidence of ischemia. Transient Ischemic Dilatation (Normal <1.22):  N/A  Lung/Heart Ratio (Normal <0.45): 0.34  Quantitative Gated Spect Images QGS EDV:  53 ml QGS ESV:  11 ml  Impression Exercise Capacity:  Lexiscan with low level exercise. BP Response:  Normal blood pressure response. Clinical Symptoms:  Significant chest pain. ECG Impression:  No significant ST segment change suggestive of ischemia. Comparison with Prior Nuclear Study: No significant change from previous study  Overall Impression:  Normal stress nuclear study. Patient had chest pain. No EKG changes. Normal perfusion. LV systolic function is vigorous.  LV Ejection Fraction: 80%.  LV Wall Motion:  NL LV Function; NL Wall Motion  [employer]

## 2013-09-19 ENCOUNTER — Encounter: Payer: Self-pay | Admitting: Cardiovascular Disease

## 2013-09-19 ENCOUNTER — Ambulatory Visit (INDEPENDENT_AMBULATORY_CARE_PROVIDER_SITE_OTHER): Payer: Medicare PPO | Admitting: Cardiovascular Disease

## 2013-09-19 VITALS — BP 130/70 | HR 64 | Ht 69.0 in | Wt 175.0 lb

## 2013-09-19 DIAGNOSIS — I2510 Atherosclerotic heart disease of native coronary artery without angina pectoris: Secondary | ICD-10-CM

## 2013-09-19 DIAGNOSIS — E785 Hyperlipidemia, unspecified: Secondary | ICD-10-CM

## 2013-09-19 DIAGNOSIS — I059 Rheumatic mitral valve disease, unspecified: Secondary | ICD-10-CM

## 2013-09-19 DIAGNOSIS — I10 Essential (primary) hypertension: Secondary | ICD-10-CM

## 2013-09-19 DIAGNOSIS — I340 Nonrheumatic mitral (valve) insufficiency: Secondary | ICD-10-CM

## 2013-09-19 NOTE — Patient Instructions (Signed)
Your physician wants you to follow-up in:  6 months. You will receive a reminder letter in the mail two months in advance. If you don't receive a letter, please call our office to schedule the follow-up appointment.   

## 2013-09-19 NOTE — Progress Notes (Signed)
History of Present Illness: 79 yo female with history of CAD, HLD, HTN, non-Hodgkin's lymphoma, DM here today for cardiac follow up. She has been followed in the past by Dr. Juanda Chance. In 2009 she had tandem non-overlapping Promus drug-eluting stents placed in the LAD. She was evaluated in 2010 for chest pain and had a negative Myoview scan. She has been intolerant to statins and Plavix in the past. She has been on Zetia on doing well. At her last visit in September 2014, she c/o occasional chest pains, back pains. Since she has had no recent ischemic evaluation, I arranged a stress myoview. Stress myoview 09/05/13 without ischemia. Echo 08/23/13 with normal LV size and function, mild MR.   She is here for follow up. She continues to have back pain that radiates to her chest. This occurs at rest or with exertion. She notices the pain depending on her position in bed. It seems to improve with rubbing her back. No associated SOB, diaphoresis, N/V.    Primary Care Physician: Ruthe Mannan  Last Lipid Profile:Lipid Panel     Component Value Date/Time   CHOL 177 02/27/2013 0854   TRIG 270.0* 02/27/2013 0854   HDL 38.80* 02/27/2013 0854   CHOLHDL 5 02/27/2013 0854   VLDL 54.0* 02/27/2013 0854   LDLCALC 89 08/22/2012 0844     Past Medical History  Diagnosis Date  . GERD (gastroesophageal reflux disease)   . CAD (coronary artery disease)   . Urticaria   . Hyperlipidemia   . Hypertension   . Allergy     Cipro, Plavix, Statins  . Personal history of colonic polyps   . Diabetes mellitus     Diagnosed 2012  . NHL (non-Hodgkin's lymphoma) dx'd 2003    chemo/xrt comp 2003  . Proctitis   . Diverticulosis of colon (without mention of hemorrhage)     Past Surgical History  Procedure Laterality Date  . Vaginal hysterectomy      partial, fibroids, one ovary left  . Knee surgery  10/2003    left  . Coronary angioplasty with stent placement      x 2  . Dexa-neg    . Cardiac cath-neg    . Laser surgery  for cataracts-left    . Stress cardiolite      Current Outpatient Prescriptions  Medication Sig Dispense Refill  . aspirin (ASPIRIN LOW DOSE) 81 MG tablet Take 81 mg by mouth daily.        Marland Kitchen BAYER CONTOUR TEST test strip USE TO CHECK BLOOD SUGAR TWICE A DAY  100 each  5  . cetirizine (ZYRTEC) 10 MG tablet Take 10 mg by mouth as needed.       Marland Kitchen CRANBERRY PO Take 1 tablet by mouth daily.      Marland Kitchen glipiZIDE (GLUCOTROL) 5 MG tablet TAKE ONE TABLET BY MOUTH TWICE DAILY  60 tablet  5  . ibuprofen (ADVIL,MOTRIN) 200 MG tablet Take 200 mg by mouth as needed.        . lansoprazole (PREVACID) 15 MG capsule Take 1 capsule (15 mg total) by mouth daily.  30 capsule  11  . losartan (COZAAR) 50 MG tablet TAKE ONE TABLET BY MOUTH ONE TIME DAILY  30 tablet  3  . meclizine (ANTIVERT) 25 MG tablet Take 25 mg by mouth every 8 (eight) hours as needed.       . metoprolol succinate (TOPROL-XL) 100 MG 24 hr tablet Take one tablet by mouth every morning  30 tablet  1  . MICROLET LANCETS MISC USE WITH BAYER CONTOUR METER  TEST BLOOD SUGAR TWICE DAILY  100 each  5  . Multiple Vitamins-Minerals (VISION FORMULA) TABS Take 1 tablet by mouth daily.        Marland Kitchen ZETIA 10 MG tablet Take one tablet by mouth one time daily  30 tablet  2  . [DISCONTINUED] loratadine (CLARITIN) 10 MG tablet Take 10 mg by mouth as needed.        No current facility-administered medications for this visit.    Allergies  Allergen Reactions  . Celecoxib     REACTION: [place]  . Cephalexin     REACTION: trash and swelling  . Ciprofloxacin     REACTION: u/k  . Clopidogrel Bisulfate     REACTION: swelling, rash  . Codeine Phosphate     REACTION: unspecified  . Ezetimibe-Simvastatin     REACTION: myalgias  . Hydrocod Polst-Cpm Polst Er     REACTION: u/k  . Nitrofurantoin     REACTION: [place]  . Pregabalin     REACTION: felt bad  . Propoxyphene-Acetaminophen     REACTION: [place]  . Rofecoxib   . Shellfish Allergy   . Sulfamethoxazole     REACTION:  unspecified  . Sulfonamide Derivatives     REACTION: unspecified    History   Social History  . Marital Status: Married    Spouse Name: N/A    Number of Children: N/A  . Years of Education: N/A   Occupational History  . Retired    Social History Main Topics  . Smoking status: Never Smoker   . Smokeless tobacco: Never Used  . Alcohol Use: No  . Drug Use: No  . Sexual Activity: Not on file   Other Topics Concern  . Not on file   Social History Narrative  . No narrative on file    Family History  Problem Relation Age of Onset  . Cancer Mother     jaw  . Hypertension Father   . Heart attack Father   . Colon cancer Neg Hx   . Heart attack Sister     Review of Systems:  As stated in the HPI and otherwise negative.   BP 130/70  Pulse 64  Ht 5\' 9"  (1.753 m)  Wt 175 lb (79.379 kg)  BMI 25.83 kg/m2  Physical Examination: General: Well developed, well nourished, NAD HEENT: OP clear, mucus membranes moist SKIN: warm, dry. No rashes. Neuro: No focal deficits Musculoskeletal: Muscle strength 5/5 all ext Psychiatric: Mood and affect normal Neck: No JVD, no carotid bruits, no thyromegaly, no lymphadenopathy. Lungs:Clear bilaterally, no wheezes, rhonci, crackles Cardiovascular: Huston Foley, Regular rhythm. No murmurs, gallops or rubs. Abdomen:Soft. Bowel sounds present. Non-tender.  Extremities: No lower extremity edema. Pulses are 2 + in the bilateral DP/PT.  Exercise stress myoview: 09/05/13:  Stress Procedure: The patient exercised on the treadmill utilizing the Bruce Protocol for 7:06 minutes. The patient stopped due to sob, jaw pain, chest pressure and chest pain. Technetium 53m Sestamibi was injected at peak exercise and myocardial perfusion imaging was performed after a brief delay.  Stress ECG: No significant change from baseline ECG  QPS  Raw Data Images: Normal; no motion artifact; normal heart/lung ratio.  Stress Images: Normal homogeneous uptake in all areas of  the myocardium.  Rest Images: Normal homogeneous uptake in all areas of the myocardium.  Subtraction (SDS): No evidence of ischemia.  Transient Ischemic Dilatation (Normal <1.22): N/A  Lung/Heart Ratio (Normal <0.45):  0.34  Quantitative Gated Spect Images  QGS EDV: 53 ml  QGS ESV: 11 ml  Impression  Exercise Capacity: Lexiscan with low level exercise.  BP Response: Normal blood pressure response.  Clinical Symptoms: Significant chest pain.  ECG Impression: No significant ST segment change suggestive of ischemia.  Comparison with Prior Nuclear Study: No significant change from previous study  Overall Impression: Normal stress nuclear study. Patient had chest pain. No EKG changes. Normal perfusion. LV systolic function is vigorous.  LV Ejection Fraction: 80%. LV Wall Motion: NL LV Function; NL Wall Motion  Echo 08/23/13: Left ventricle: The cavity size was normal. There was mild concentric hypertrophy. Systolic function was vigorous. The estimated ejection fraction was in the range of 65% to 70%. Wall motion was normal; there were no regional wall motion abnormalities. Doppler parameters are consistent with abnormal left ventricular relaxation (grade 1 diastolic dysfunction). - Mitral valve: Mildly calcified annulus. Mild regurgitation. - Left atrium: The atrium was mildly to moderately dilated.  Assessment and Plan:   1. CORONARY ARTERY DISEASE: Stable. Stress myoview 09/05/13 without ischemia. LV function normal. Continue Zetia, ASA, Toprol. She is intolerant to statins.   2. Hyperlipidemia: Lipids well controlled April 2014 as above. Will continue Zetia. Repeat lipids and LFTs in April 2015.   3. HTN: BP controlled. Continue Cozaar and Toprol.   4. Mitral regurgitation: Mild by echo September 2014. Repeat echo September 2016.

## 2013-09-27 ENCOUNTER — Telehealth: Payer: Self-pay

## 2013-09-27 NOTE — Telephone Encounter (Signed)
Michelle nurse with [facility] left v/m; medicare will not cover a service that was not audible on v/m. Left v/m requesting cb.

## 2013-10-01 ENCOUNTER — Encounter: Payer: Self-pay | Admitting: Family Medicine

## 2013-10-01 ENCOUNTER — Ambulatory Visit (INDEPENDENT_AMBULATORY_CARE_PROVIDER_SITE_OTHER): Payer: Medicare PPO | Admitting: Family Medicine

## 2013-10-01 VITALS — BP 114/80 | HR 74 | Temp 98.2°F | Ht 69.0 in | Wt 177.5 lb

## 2013-10-01 DIAGNOSIS — M7711 Lateral epicondylitis, right elbow: Secondary | ICD-10-CM

## 2013-10-01 DIAGNOSIS — M7701 Medial epicondylitis, right elbow: Secondary | ICD-10-CM

## 2013-10-01 DIAGNOSIS — M7710 Lateral epicondylitis, unspecified elbow: Secondary | ICD-10-CM

## 2013-10-01 DIAGNOSIS — M7700 Medial epicondylitis, unspecified elbow: Secondary | ICD-10-CM

## 2013-10-01 NOTE — Progress Notes (Signed)
    Nature conservation officer at [facility] [address] [place] [address] Phone: [phone] Fax: [phone]  Date:  10/01/2013   Name:  [name] [name]   DOB:  Dec 26, 1931   MRN:  [id number] Gender: female Age: 79 y.o.  PCP:  Ruthe Mannan, MD  Evaluating MD: Hannah Beat, MD  Patient presents with lateral elbow pain.  Length of symptoms: 3 mo Hand effected: R  Patient describes a dull ache on the lateral and medial elbow. There is some translation in the proximal forearm and in the distal upper arm. It is painful to lift with the hand facing down and to lift with the thumb in an upright position. Supination is painful. 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  Using strap.  Motrin anf tylenol  No trauma.   No prior fractures or operative interventions in the effective hand. Prior PT or HEP: none  Denies numbness or tingling. No significant neck or shoulder pain.  Hand of dominance: R  REVIEW OF SYSTEMS  GEN: No fevers, chills. Nontoxic. Primarily MSK c/o today. MSK: Detailed in the HPI GI: tolerating PO intake without difficulty Neuro: No numbness, parasthesias, or tingling associated. Otherwise the pertinent positives of the ROS are noted above.   PHYSICAL EXAM  Blood pressure 114/80, pulse 74, temperature 98.2 F (36.8 C), temperature source Oral, height 5\' 9"  (1.753 m), weight 177 lb 8 oz (80.513 kg).  GEN: Well-developed,well-nourished,in no acute distress; alert,appropriate and cooperative throughout examination HEENT: Normocephalic and atraumatic without obvious abnormalities. Ears, externally no deformities PULM: Breathing comfortably in no respiratory distress EXT: No clubbing, cyanosis, or edema PSYCH: Normally interactive. Cooperative during the interview. Pleasant. Friendly and conversant. Not anxious or depressed appearing.  Normal, full affect.  []  elbow Ecchymosis or edema: neg ROM: full flexion, extension, pronation, supination Shoulder ROM: Full Flexion: 5/5 Extension: 5/5, PAINFUL Supination: 5/5, PAINFUL Pronation: 5/5 Wrist ext: 5/5 Wrist flexion: 5/5 No gross bony abnormality Varus and Valgus stress: stable ECRB tenderness: YES, TTP Medial epicondyle: TTP Lateral epicondyle, resisted wrist extension from wrist full pronation and flexion: PAINFUL grip: 5/5  sensation intact Tinel's, Elbow: negative  A/P:  Lateral epicondylitis (tennis elbow), right  Medial epicondylitis, right    Elbow anatomy was reviewed, and tendinopathy was explained.  >25 minutes spent in face to face time with patient, >50% spent in counselling or coordination of care  Pt. given a formal rehab program Series of concentric and eccentric exercises should be done starting with no weight, work up to 1 lb, hammer, etc.  Use counterforce strap if working or using hands - has one  Formal PT would be beneficial. Emphasized stretching an cross-friction massage Emphasized proper palms up lifting biomechanics to unload ECRB

## 2013-10-05 ENCOUNTER — Encounter: Payer: Self-pay | Admitting: Cardiology

## 2013-10-08 ENCOUNTER — Ambulatory Visit (INDEPENDENT_AMBULATORY_CARE_PROVIDER_SITE_OTHER): Payer: Medicare PPO | Admitting: Internal Medicine

## 2013-10-08 ENCOUNTER — Encounter: Payer: Self-pay | Admitting: Internal Medicine

## 2013-10-08 VITALS — BP 136/68 | HR 72 | Temp 98.5°F | Ht 69.0 in | Wt 177.0 lb

## 2013-10-08 DIAGNOSIS — J0100 Acute maxillary sinusitis, unspecified: Secondary | ICD-10-CM

## 2013-10-08 DIAGNOSIS — J209 Acute bronchitis, unspecified: Secondary | ICD-10-CM

## 2013-10-08 MED ORDER — AMOXICILLIN 500 MG PO CAPS: 500.0000 mg | ORAL_CAPSULE | Freq: Three times a day (TID) | ORAL | Status: DC

## 2013-10-08 MED ORDER — FLUTICASONE PROPIONATE 50 MCG/ACT NA SUSP: 2.0000 | Freq: Every day | NASAL | Status: DC

## 2013-10-08 NOTE — Patient Instructions (Signed)

## 2013-10-08 NOTE — Progress Notes (Signed)
HPI  Pt presents to the clinic today with c/o nasal congestion, face pain, fever cough. This started on Saturday. The cough is productive of thick yellow sputum. She has taken Ibuprofen OTC with only minimal relief. She has no history of asthma, seasonal allergies or breathing problems. She has not had sick contacts that she is aware of.  Review of Systems    Past Medical History  Diagnosis Date  . GERD (gastroesophageal reflux disease)   . CAD (coronary artery disease)   . Urticaria   . Hyperlipidemia   . Hypertension   . Allergy     Cipro, Plavix, Statins  . Personal history of colonic polyps   . Diabetes mellitus     Diagnosed 2012  . NHL (non-Hodgkin's lymphoma) dx'd 2003    chemo/xrt comp 2003  . Proctitis   . Diverticulosis of colon (without mention of hemorrhage)     Family History  Problem Relation Age of Onset  . Cancer Mother     jaw  . Hypertension Father   . Heart attack Father   . Colon cancer Neg Hx   . Heart attack Sister     History   Social History  . Marital Status: Married    Spouse Name: N/A    Number of Children: N/A  . Years of Education: N/A   Occupational History  . Retired    Social History Main Topics  . Smoking status: Never Smoker   . Smokeless tobacco: Never Used  . Alcohol Use: No  . Drug Use: No  . Sexual Activity: Not on file   Other Topics Concern  . Not on file   Social History Narrative  . No narrative on file    Allergies  Allergen Reactions  . Celecoxib     REACTION: [place]  . Cephalexin     REACTION: trash and swelling  . Ciprofloxacin     REACTION: u/k  . Clopidogrel Bisulfate     REACTION: swelling, rash  . Codeine Phosphate     REACTION: unspecified  . Ezetimibe-Simvastatin     REACTION: myalgias  . Hydrocod Polst-Cpm Polst Er     REACTION: u/k  . Nitrofurantoin     REACTION: [place]  . Pregabalin     REACTION: felt bad  . Propoxyphene-Acetaminophen     REACTION: [place]  . Rofecoxib   . Shellfish Allergy    . Sulfamethoxazole     REACTION: unspecified  . Sulfonamide Derivatives     REACTION: unspecified     Constitutional: Positive headache, fatigue and fever. Denies abrupt weight changes.  HEENT:  Positive eye pain, pressure behind the eyes, facial pain, nasal congestion and sore throat. Denies eye redness, ear pain, ringing in the ears, wax buildup, runny nose or bloody nose. Respiratory: Positive cough and thick yellow sputum production. Denies difficulty breathing or shortness of breath.  Cardiovascular: Denies chest pain, chest tightness, palpitations or swelling in the hands or feet.   No other specific complaints in a complete review of systems (except as listed in HPI above).  Objective:    BP 136/68  Pulse 72  Temp(Src) 98.5 F (36.9 C) (Oral)  Ht 5\' 9"  (1.753 m)  Wt 177 lb (80.287 kg)  BMI 26.13 kg/m2 Wt Readings from Last 3 Encounters:  10/08/13 177 lb (80.287 kg)  10/01/13 177 lb 8 oz (80.513 kg)  09/19/13 175 lb (79.379 kg)    General: Appears her stated age, well developed, well nourished in NAD. HEENT: Head: normal  shape and size, maxillary tenderness to palpation; Eyes: sclera white, no icterus, conjunctiva pink, PERRLA and EOMs intact; Ears: Tm's gray and intact, normal light reflex; Nose: mucosa pink and moist, septum midline; Throat/Mouth: + PND. Teeth present, mucosa pink and moist, no exudate noted, no lesions or ulcerations noted.  Neck: Mild cervical lymphadenopathy. Neck supple, trachea midline. No massses, lumps or thyromegaly present.  Cardiovascular: Normal rate and rhythm. S1,S2 noted.  No murmur, rubs or gallops noted. No JVD or BLE edema. No carotid bruits noted. Pulmonary/Chest: Normal effort and intermittent wheeze noted in bilateral upper lobes. No respiratory distress. No rales or ronchi noted.      Assessment & Plan:   Acute bacterial sinusitis and acute bronchitis:  Can use a Neti Pot which can be purchased from your local drug  store. Flonase 2 sprays each nostril for 3 days and then as needed. Amoxicillin BID for 10 days  RTC as needed or if symptoms persist.

## 2013-10-08 NOTE — Progress Notes (Signed)
Pre-visit discussion using our clinic review tool. No additional management support is needed unless otherwise documented below in the visit note.  

## 2013-10-08 NOTE — Progress Notes (Signed)
HPI: Pt presents today with complaints of chest congestion and sinus pressure. Pt states symptoms started Saturday. She endorses productive cough, sinus pressure, headache, hoarseness, and nasal congestion. Pt denies fever, chills, shortness or breath, or chest tightness. Pt tried Ibuprofen for pain with some relief.  Past Medical History  Diagnosis Date  . GERD (gastroesophageal reflux disease)   . CAD (coronary artery disease)   . Urticaria   . Hyperlipidemia   . Hypertension   . Allergy     Cipro, Plavix, Statins  . Personal history of colonic polyps   . Diabetes mellitus     Diagnosed 2012  . NHL (non-Hodgkin's lymphoma) dx'd 2003    chemo/xrt comp 2003  . Proctitis   . Diverticulosis of colon (without mention of hemorrhage)     Current Outpatient Prescriptions  Medication Sig Dispense Refill  . aspirin (ASPIRIN LOW DOSE) 81 MG tablet Take 81 mg by mouth daily.        Marland Kitchen BAYER CONTOUR TEST test strip USE TO CHECK BLOOD SUGAR TWICE A DAY  100 each  5  . cetirizine (ZYRTEC) 10 MG tablet Take 10 mg by mouth as needed.       Marland Kitchen CRANBERRY PO Take 1 tablet by mouth daily.      Marland Kitchen glipiZIDE (GLUCOTROL) 5 MG tablet TAKE ONE TABLET BY MOUTH TWICE DAILY  60 tablet  5  . ibuprofen (ADVIL,MOTRIN) 200 MG tablet Take 200 mg by mouth as needed.        . lansoprazole (PREVACID) 15 MG capsule Take 1 capsule (15 mg total) by mouth daily.  30 capsule  11  . losartan (COZAAR) 50 MG tablet TAKE ONE TABLET BY MOUTH ONE TIME DAILY  30 tablet  3  . meclizine (ANTIVERT) 25 MG tablet Take 25 mg by mouth every 8 (eight) hours as needed.       . metoprolol succinate (TOPROL-XL) 100 MG 24 hr tablet Take one tablet by mouth every morning  30 tablet  1  . MICROLET LANCETS MISC USE WITH BAYER CONTOUR METER  TEST BLOOD SUGAR TWICE DAILY  100 each  5  . Multiple Vitamins-Minerals (VISION FORMULA) TABS Take 1 tablet by mouth daily.        Marland Kitchen ZETIA 10 MG tablet Take one tablet by mouth one time daily  30 tablet  2  .  [DISCONTINUED] loratadine (CLARITIN) 10 MG tablet Take 10 mg by mouth as needed.        No current facility-administered medications for this visit.    Allergies  Allergen Reactions  . Celecoxib     REACTION: [place]  . Cephalexin     REACTION: trash and swelling  . Ciprofloxacin     REACTION: u/k  . Clopidogrel Bisulfate     REACTION: swelling, rash  . Codeine Phosphate     REACTION: unspecified  . Ezetimibe-Simvastatin     REACTION: myalgias  . Hydrocod Polst-Cpm Polst Er     REACTION: u/k  . Nitrofurantoin     REACTION: [place]  . Pregabalin     REACTION: felt bad  . Propoxyphene-Acetaminophen     REACTION: [place]  . Rofecoxib   . Shellfish Allergy   . Sulfamethoxazole     REACTION: unspecified  . Sulfonamide Derivatives     REACTION: unspecified    Family History  Problem Relation Age of Onset  . Cancer Mother     jaw  . Hypertension Father   . Heart attack Father   .  Colon cancer Neg Hx   . Heart attack Sister     History   Social History  . Marital Status: Married    Spouse Name: N/A    Number of Children: N/A  . Years of Education: N/A   Occupational History  . Retired    Social History Main Topics  . Smoking status: Never Smoker   . Smokeless tobacco: Never Used  . Alcohol Use: No  . Drug Use: No  . Sexual Activity: Not on file   Other Topics Concern  . Not on file   Social History Narrative  . No narrative on file    ROS:  Constitutional:Endores headache. Denies fever, malaise, fatigue, or abrupt weight changes.  HEENT:Endores sinus pressure, nasal congestion, sore throat, and hoarseness. Denies eye pain, eye redness, ear pain, ringing in the ears, wax buildup, runny nose, or, bloody nose Respiratory: Endorses productive cough and sputum production. Denies difficulty breathing or shortness of breath Cardiovascular: Denies chest pain, chest tightness, palpitations or swelling in the hands or feet.    No other specific complaints in a complete  review of systems (except as listed in HPI above).  PE:  BP 136/68  Pulse 72  Temp(Src) 98.5 F (36.9 C) (Oral)  Ht 5\' 9"  (1.753 m)  Wt 177 lb (80.287 kg)  BMI 26.13 kg/m2 Wt Readings from Last 3 Encounters:  10/08/13 177 lb (80.287 kg)  10/01/13 177 lb 8 oz (80.513 kg)  09/19/13 175 lb (79.379 kg)    General: Appears their stated age, well developed, well nourished in NAD. HEENT: Head: normal shape and size; Eyes: sclera white, no icterus, conjunctiva pink, PERRLA and EOMs intact; Ears: Tm's gray and intact, normal light reflex; Nose: mucosa pink and moist,turbinates inflamed; bilateral maxillary tenderness, without frontal. septum midline; Throat/Mouth: Teeth present, mucosa pink and moist, no lesions or ulcerations noted.  Neck: Normal range of motion. Neck supple, trachea midline. No massses, lumps or thyromegaly present.  Cardiovascular: Normal rate and rhythm. S1,S2 noted.  No murmur, rubs or gallops noted. No JVD or BLE edema. No carotid bruits noted.Marland Kitchen Psychiatric: Mood and affect normal. Behavior is normal. Judgment and thought content normal.   Assessment and Plan: Acute Sinusitis/Acute Bronchitis Amoxicillin 500mg  one tablet by mouth three times a day for 10days; #30, no refills Flonase 16g 2 puffs to each nare twice daily as needed for nasal congestion; 2 refills Try sinus rinse as needed for congestion Follow up in 3-5 days if symptoms do not improve or worsening  Maximiano Lott S, Student-NP

## 2013-10-10 ENCOUNTER — Other Ambulatory Visit: Payer: Self-pay | Admitting: Cardiovascular Disease

## 2013-10-10 ENCOUNTER — Other Ambulatory Visit: Payer: Self-pay | Admitting: Family Medicine

## 2013-10-10 NOTE — Telephone Encounter (Signed)
Received refill request electronically. Last office visit 10/08/13. See allergy/contraindication. Is it okay to refill?

## 2013-10-17 NOTE — Telephone Encounter (Signed)
noted 

## 2013-10-17 NOTE — Telephone Encounter (Signed)
Spoke with [name] [name]; medicare will not cover 3D imaging for mammogram but will cover mammogram only. [name] [name] does not need cb; pt was mailed letter about denial  For 3D mammogram done on 04/05/13 with instructions of how to request a review for denial. [name] [name] wanted Dr Dayton Martes to be aware.

## 2013-10-19 ENCOUNTER — Other Ambulatory Visit: Payer: Self-pay | Admitting: Family Medicine

## 2013-10-29 ENCOUNTER — Telehealth: Payer: Self-pay

## 2013-10-29 NOTE — Telephone Encounter (Signed)
Pt has billing question for 10/08/13; pt advised to call [phone].

## 2013-11-23 NOTE — Telephone Encounter (Signed)
Pt said got another bill for $40 copay which is a specialist chg/ pt said she was on hold over 30 mins and wants me to contact [facility] billing for her; i spoke with Aundra Millet at [phone] and Aundra Millet will contact [employer] and call pt back by 11/27/13. Pt voiced understanding.

## 2013-11-26 ENCOUNTER — Other Ambulatory Visit: Payer: Self-pay | Admitting: Family Medicine

## 2013-11-26 ENCOUNTER — Other Ambulatory Visit: Payer: Self-pay | Admitting: Cardiovascular Disease

## 2013-12-03 ENCOUNTER — Other Ambulatory Visit: Payer: Self-pay | Admitting: Cardiovascular Disease

## 2013-12-27 LAB — HM DIABETES EYE EXAM

## 2013-12-31 ENCOUNTER — Encounter: Payer: Self-pay | Admitting: Family Medicine

## 2014-02-12 ENCOUNTER — Other Ambulatory Visit: Payer: Self-pay | Admitting: Cardiovascular Disease

## 2014-02-20 ENCOUNTER — Other Ambulatory Visit: Payer: Self-pay | Admitting: Family Medicine

## 2014-03-18 ENCOUNTER — Other Ambulatory Visit: Payer: Self-pay | Admitting: *Deleted

## 2014-03-18 ENCOUNTER — Encounter: Payer: Self-pay | Admitting: Family Medicine

## 2014-03-18 ENCOUNTER — Encounter (HOSPITAL_COMMUNITY): Payer: Self-pay | Admitting: Pharmacy Technician

## 2014-03-18 ENCOUNTER — Encounter (INDEPENDENT_AMBULATORY_CARE_PROVIDER_SITE_OTHER): Payer: Self-pay

## 2014-03-18 ENCOUNTER — Encounter: Payer: Self-pay | Admitting: *Deleted

## 2014-03-18 ENCOUNTER — Ambulatory Visit (INDEPENDENT_AMBULATORY_CARE_PROVIDER_SITE_OTHER): Payer: Medicare PPO | Admitting: Cardiovascular Disease

## 2014-03-18 ENCOUNTER — Encounter: Payer: Self-pay | Admitting: Cardiovascular Disease

## 2014-03-18 ENCOUNTER — Ambulatory Visit (INDEPENDENT_AMBULATORY_CARE_PROVIDER_SITE_OTHER): Payer: Medicare PPO | Admitting: Family Medicine

## 2014-03-18 VITALS — BP 120/60 | HR 72 | Temp 98.2°F | Ht 68.25 in | Wt 177.0 lb

## 2014-03-18 VITALS — BP 146/82 | HR 64 | Ht 69.0 in | Wt 176.0 lb

## 2014-03-18 DIAGNOSIS — M7062 Trochanteric bursitis, left hip: Secondary | ICD-10-CM

## 2014-03-18 DIAGNOSIS — E785 Hyperlipidemia, unspecified: Secondary | ICD-10-CM

## 2014-03-18 DIAGNOSIS — M79609 Pain in unspecified limb: Secondary | ICD-10-CM

## 2014-03-18 DIAGNOSIS — I10 Essential (primary) hypertension: Secondary | ICD-10-CM

## 2014-03-18 DIAGNOSIS — M7750 Other enthesopathy of unspecified foot: Secondary | ICD-10-CM

## 2014-03-18 DIAGNOSIS — I2510 Atherosclerotic heart disease of native coronary artery without angina pectoris: Secondary | ICD-10-CM

## 2014-03-18 DIAGNOSIS — M79672 Pain in left foot: Principal | ICD-10-CM

## 2014-03-18 DIAGNOSIS — M7670 Peroneal tendinitis, unspecified leg: Secondary | ICD-10-CM

## 2014-03-18 DIAGNOSIS — M76899 Other specified enthesopathies of unspecified lower limb, excluding foot: Secondary | ICD-10-CM

## 2014-03-18 DIAGNOSIS — G4700 Insomnia, unspecified: Secondary | ICD-10-CM

## 2014-03-18 DIAGNOSIS — I200 Unstable angina: Secondary | ICD-10-CM

## 2014-03-18 DIAGNOSIS — M79671 Pain in right foot: Secondary | ICD-10-CM

## 2014-03-18 LAB — BASIC METABOLIC PANEL
BUN: 19 mg/dL (ref 6–23)
CHLORIDE: 105 meq/L (ref 96–112)
CO2: 29 mEq/L (ref 19–32)
CREATININE: 1.1 mg/dL (ref 0.4–1.2)
Calcium: 9.4 mg/dL (ref 8.4–10.5)
GFR: 51.32 mL/min — ABNORMAL LOW (ref >60.00)
Glucose, Bld: 109 mg/dL — ABNORMAL HIGH (ref 70–99)
Potassium: 5.2 mEq/L — ABNORMAL HIGH (ref 3.5–5.1)
SODIUM: 141 meq/L (ref 135–145)

## 2014-03-18 LAB — CBC
HCT: 43.1 % (ref 36.0–46.0)
Hemoglobin: 14.5 g/dL (ref 12.0–15.0)
MCHC: 33.6 g/dL (ref 30.0–36.0)
MCV: 97 fl (ref 78.0–100.0)
PLATELETS: 206 10*3/uL (ref 150.0–400.0)
RBC: 4.45 Mil/uL (ref 3.87–5.11)
RDW: 13.6 % (ref 11.5–14.6)
WBC: 4.7 10*3/uL (ref 4.5–10.5)

## 2014-03-18 LAB — HEPATIC FUNCTION PANEL
ALT: 59 U/L — ABNORMAL HIGH (ref 0–35)
AST: 53 U/L — ABNORMAL HIGH (ref 0–37)
Albumin: 4.1 g/dL (ref 3.5–5.2)
Alkaline Phosphatase: 59 U/L (ref 39–117)
Bilirubin, Direct: 0.1 mg/dL (ref 0.0–0.3)
TOTAL PROTEIN: 7.2 g/dL (ref 6.0–8.3)
Total Bilirubin: 1 mg/dL (ref 0.3–1.2)

## 2014-03-18 LAB — PROTIME-INR
INR: 1 ratio (ref 0.8–1.0)
Prothrombin Time: 11.5 s (ref 9.6–13.1)

## 2014-03-18 LAB — LIPID PANEL
CHOL/HDL RATIO: 4
CHOLESTEROL: 177 mg/dL (ref 0–200)
HDL: 44.6 mg/dL (ref >39.00)
LDL CALC: 103 mg/dL — AB (ref 0–99)
TRIGLYCERIDES: 146 mg/dL (ref 0.0–149.0)
VLDL: 29.2 mg/dL (ref 0.0–40.0)

## 2014-03-18 MED ORDER — TRAZODONE HCL 50 MG PO TABS: 50.0000 mg | ORAL_TABLET | Freq: Every evening | ORAL | Status: DC | PRN

## 2014-03-18 NOTE — Progress Notes (Signed)
Date:  03/18/2014   Name:  [name] [name]   DOB:  February 08, 1932   MRN:  [id number]  Primary Physician:  Arnette Norris, MD   Chief Complaint: Foot Orthotics and Hip Pain   Subjective:   History of Present Illness:  [name] [name] is a 80 y.o. pleasant patient who presents with the following:  Very pleasant elderly lady who presents for multiple problems including many years of bilateral foot pain. She does have some significant longitudinal arch collapse as well as extensive forefoot breakdown. She has intermittently been having problems with her peroneal and posterior tibialis tendons off and on for years. She also has some occasional ache in the forefoot, that is not so significant right now.  She also has had some intermittent trochanteric bursitis on the LEFT. I did one injection on this about 9 months ago, the patient had excellent relief of symptoms. She is active and tries to walk.  She also has been having some intermittent insomnia.  Patient Active Problem List   Diagnosis Date Noted  . Burning sensation of feet 06/13/2013  . Pes planus 06/13/2013  . Left hip pain 10/02/2012  . Abdominal pain, chronic, epigastric 03/24/2012  . Renal insufficiency 06/07/2011  . Diabetes mellitus 05/31/2011  . MIXED HYPERLIPIDEMIA 08/04/2010  . BEN HTN HEART DISEASE WITHOUT HEART FAIL 08/04/2010  . LUMBAR SPRAIN AND STRAIN 01/15/2010  . VERTIGO 10/29/2009  . OSTEOPENIA 04/29/2009  . 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 02/25/2009  . EXTERNAL HEMORRHOIDS 02/24/2009  . DIVERTICULOSIS, COLON 02/24/2009  . HEMOPTYSIS 09/02/2008  . BACK PAIN, THORACIC REGION 04/30/2008  . LYMPHOMA, MALT 08/28/2007  . DETACHED RETINA 08/28/2007  . LABILE HYPERTENSION 08/28/2007  . CORONARY ARTERY DISEASE 08/28/2007  . MITRAL VALVE PROLAPSE 08/28/2007  . GERD 08/28/2007  . IBS 08/28/2007  . BREAST CYSTS, BILATERAL 08/28/2007  . LOW BACK PAIN, CHRONIC 06/29/2007    Past Medical History  Diagnosis  Date  . GERD (gastroesophageal reflux disease)   . CAD (coronary artery disease)   . Urticaria   . Hyperlipidemia   . Hypertension   . Allergy     Cipro, Plavix, Statins  . Personal history of colonic polyps   . Diabetes mellitus     Diagnosed 2012  . NHL (non-Hodgkin's lymphoma) dx'd 2003    chemo/xrt comp 2003  . Proctitis   . Diverticulosis of colon (without mention of hemorrhage)     Past Surgical History  Procedure Laterality Date  . Vaginal hysterectomy      partial, fibroids, one ovary left  . Knee surgery  10/2003    left  . Coronary angioplasty with stent placement      x 2  . Dexa-neg    . Cardiac cath-neg    . Laser surgery for cataracts-left    . Stress cardiolite      History   Social History  . Marital Status: Married    Spouse Name: N/A    Number of Children: N/A  . Years of Education: N/A   Occupational History  . Retired    Social History Main Topics  . Smoking status: Never Smoker   . Smokeless tobacco: Never Used  . Alcohol Use: No  . Drug Use: No  . Sexual Activity: Not on file   Other Topics Concern  . Not on file   Social History Narrative  . No narrative on file    Family History  Problem Relation Age of Onset  . Cancer  Mother     jaw  . Hypertension Father   . Heart attack Father   . Colon cancer Neg Hx   . Heart attack Sister     Allergies  Allergen Reactions  . Shellfish Allergy Anaphylaxis  . Celecoxib     REACTION: [place]  . Cephalexin     REACTION: trash and swelling  . Ciprofloxacin     REACTION: u/k  . Clopidogrel Bisulfate     REACTION: swelling, rash  . Codeine Phosphate     REACTION: unspecified  . Ezetimibe-Simvastatin     REACTION: myalgias  . Hydrocod Polst-Cpm Polst Er     REACTION: u/k  . Nitrofurantoin     REACTION: [place]  . Pregabalin     REACTION: felt bad  . Propoxyphene N-Acetaminophen     REACTION: [place]  . Rofecoxib     unk   . Sulfamethoxazole     REACTION: unspecified  . Sulfonamide  Derivatives     REACTION: unspecified    Medication list has been reviewed and updated.  Review of Systems:  GEN: No fevers, chills. Nontoxic. Primarily MSK c/o today. MSK: Detailed in the HPI GI: tolerating PO intake without difficulty Neuro: No numbness, parasthesias, or tingling associated. Otherwise the pertinent positives of the ROS are noted above.   Objective:   Physical Examination: BP 120/60  Pulse 72  Temp(Src) 98.2 F (36.8 C) (Oral)  Ht 5' 8.25" (1.734 m)  Wt 177 lb (80.287 kg)  BMI 26.70 kg/m2  SpO2 96%  Ideal Body Weight: Weight in (lb) to have BMI = 25: 165.3   GEN: WDWN, NAD, Non-toxic, Alert & Oriented x 3 HEENT: Atraumatic, Normocephalic.  Ears and Nose: No external deformity. EXTR: No clubbing/cyanosis/edema NEURO: Normal gait.  PSYCH: Normally interactive. Conversant. Not depressed or anxious appearing.  Calm demeanor.   HIP EXAM: SIDE:  ROM: Abduction, Flexion, Internal and External range of motion: modest restriction only Pain with terminal IROM and EROM: no GTB: L TTP SLR: NEG Knees: No effusion FABER: NT REVERSE FABER: NT, neg Piriformis: NT at direct palpation Str: flexion: 5/5 abduction: 5/5 adduction: 5/5 Strength testing non-tender  FEET: B Echymosis: no Edema: no ROM: full LE B Gait: heel toe, non-antalgic MT pain: no Callus pattern: none Lateral Mall: NT Medial Mall: NT Talus: NT Navicular: NT Cuboid: NT Calcaneous: NT Metatarsals: NT 5th MT: NT Phalanges: NT Achilles: NT Plantar Fascia: NT Fat Pad: NT Peroneals: mild ttp Post Tib: mild ttp Great Toe: Nml motion Ant Drawer: neg ATFL: NT CFL: NT Deltoid: NT Long arch: mod pes planus Transverse arch: extensive forefoot breakdown Hindfoot breakdown: none Sensation: intact   No results found.  Assessment & Plan:   Foot pain, bilateral  Peroneal tendinitis  Trochanteric bursitis of left hip  Insomnia  >40 minutes spent in face to face time with  patient, >50% spent in counselling or coordination of care: additional gait analysis and discussion with footwear.   Patient was fitted for a standard, cushioned, semi-rigid orthotic.  The orthotic was heated and the patient stood on the orthotic blank positioned on the orthotic stand. The patient was positioned in subtalar neutral position and 10 degrees of ankle dorsiflexion in a weight bearing stance. After molding, a stable Fast-Tech EVA base was applied to the orthotic blank.   The blank was ground to a stable position for weight bearing. Size: 8 female base: f3 posting: none additional orthotic padding: none   Trochanteric Bursitis Injection, LEFT Verbal consent obtained. Risks (  including infection, potential atrophy), benefits, and alternatives reviewed. Greater trochanter sterilely prepped with Chloraprep. Ethyl Chloride used for anesthesia. 8 cc of Lidocaine 1% injected with 2 cc of 40 mg Depo-Medrol into trochanteric bursa at area of maximal tenderness at greater trochanter. Needle taken to bone to troch bursa, flows easily. Bursa massaged. No bleeding and no complications. Decreased pain after injection. Needle: 22 gauge spinal needle   Trazodone prn for insomnia  Follow-up: prn  New Prescriptions   TRAZODONE (DESYREL) 50 MG TABLET    Take 1 tablet (50 mg total) by mouth at bedtime as needed for sleep.   No orders of the defined types were placed in this encounter.    Signed,  Maud Deed. Fantasy Donald, MD, [facility] at [facility] [place] [address] Phone: [phone] Fax: [phone]  Patient's Medications  New Prescriptions   TRAZODONE (DESYREL) 50 MG TABLET    Take 1 tablet (50 mg total) by mouth at bedtime as needed for sleep.  Previous Medications   ASPIRIN (ASPIRIN LOW DOSE) 81 MG TABLET    Take 81 mg by mouth daily.     BETA CAROTENE W/MINERALS (OCUVITE) TABLET    Take 1 tablet by mouth daily.   CETIRIZINE (ZYRTEC) 10  MG TABLET    Take 10 mg by mouth daily as needed for allergies.    CRANBERRY PO    Take 1 tablet by mouth daily.   GLIPIZIDE (GLUCOTROL) 5 MG TABLET    Take 5 mg by mouth 2 (two) times daily.   GLUCOSE BLOOD (BAYER CONTOUR TEST) TEST STRIP    USE TO TEST GLUCOSE TWICE A DAY   LANSOPRAZOLE (PREVACID) 15 MG CAPSULE    Take 15 mg by mouth daily at 12 noon.   LOSARTAN (COZAAR) 50 MG TABLET    Take 50 mg by mouth daily.   MICROLET LANCETS MISC    Use with bayer contour meter test blood sugar twice daily   TIMOLOL (TIMOPTIC) 0.25 % OPHTHALMIC SOLUTION    Place 1 drop into both eyes 2 (two) times daily.    ZETIA 10 MG TABLET    TAKE ONE TABLET BY MOUTH EVERY DAY  Modified Medications   No medications on file  Discontinued Medications   AMOXICILLIN (AMOXIL) 500 MG CAPSULE    Take 1 capsule (500 mg total) by mouth 3 (three) times daily.   GLIPIZIDE (GLUCOTROL) 5 MG TABLET    Take one tablet by mouth twice daily   IBUPROFEN (ADVIL,MOTRIN) 200 MG TABLET    Take 200 mg by mouth as needed.     LANSOPRAZOLE (PREVACID) 15 MG CAPSULE    Take one capsule by mouth one time daily   LOSARTAN (COZAAR) 50 MG TABLET    TAKE ONE TABLET BY MOUTH ONE TIME DAILY    MECLIZINE (ANTIVERT) 25 MG TABLET    Take 25 mg by mouth every 8 (eight) hours as needed.    METOPROLOL SUCCINATE (TOPROL-XL) 100 MG 24 HR TABLET    TAKE ONE TABLET BY MOUTH EVERY MORNING    MULTIPLE VITAMINS-MINERALS (VISION FORMULA) TABS    Take 1 tablet by mouth daily.

## 2014-03-18 NOTE — Patient Instructions (Addendum)
Your physician recommends that you schedule a follow-up appointment in:  4-6 weeks with PA or NP.   Your physician has requested that you have a cardiac catheterization. Cardiac catheterization is used to diagnose and/or treat various heart conditions. Doctors may recommend this procedure for a number of different reasons. The most common reason is to evaluate chest pain. Chest pain can be a symptom of coronary artery disease (CAD), and cardiac catheterization can show whether plaque is narrowing or blocking your heart's arteries. This procedure is also used to evaluate the valves, as well as measure the blood flow and oxygen levels in different parts of your heart. For further information please visit [email or web address]. Please follow instruction sheet, as given. Scheduled for March 22, 2014

## 2014-03-18 NOTE — Progress Notes (Signed)
Pre visit review using our clinic review tool, if applicable. No additional management support is needed unless otherwise documented below in the visit note. 

## 2014-03-18 NOTE — Progress Notes (Signed)
History of Present Illness: 80 yo female with history of CAD, HLD, HTN, non-Hodgkin's lymphoma, DM here today for cardiac follow up. She has been followed in the past by Dr. Olevia Perches. In 2009 she had tandem non-overlapping Promus drug-eluting stents placed in the LAD. She was evaluated in 2010 for chest pain and had a negative Myoview scan. She has been intolerant to statins and Plavix in the past. She has been on Zetia on doing well. Stress myoview 09/05/13 without ischemia. Echo 08/23/13 with normal LV size and function, mild MR.   She is here for follow up. She found her sister in law dead last week. She has been upset. She has been having severe pains in her back with radiation into her chest. The pain lasts for 10 minutes. This also occurs with exertion.   Primary Care Physician: Arnette Norris  Last Lipid Profile:Lipid Panel     Component Value Date/Time   CHOL 177 02/27/2013 0854   TRIG 270.0* 02/27/2013 0854   HDL 38.80* 02/27/2013 0854   CHOLHDL 5 02/27/2013 0854   VLDL 54.0* 02/27/2013 0854   LDLCALC 89 08/22/2012 0844     Past Medical History  Diagnosis Date  . GERD (gastroesophageal reflux disease)   . CAD (coronary artery disease)   . Urticaria   . Hyperlipidemia   . Hypertension   . Allergy     Cipro, Plavix, Statins  . Personal history of colonic polyps   . Diabetes mellitus     Diagnosed 2012  . NHL (non-Hodgkin's lymphoma) dx'd 2003    chemo/xrt comp 2003  . Proctitis   . Diverticulosis of colon (without mention of hemorrhage)     Past Surgical History  Procedure Laterality Date  . Vaginal hysterectomy      partial, fibroids, one ovary left  . Knee surgery  10/2003    left  . Coronary angioplasty with stent placement      x 2  . Dexa-neg    . Cardiac cath-neg    . Laser surgery for cataracts-left    . Stress cardiolite      Current Outpatient Prescriptions  Medication Sig Dispense Refill  . aspirin (ASPIRIN LOW DOSE) 81 MG tablet Take 81 mg by mouth daily.         . cetirizine (ZYRTEC) 10 MG tablet Take 10 mg by mouth as needed.       Marland Kitchen CRANBERRY PO Take 1 tablet by mouth daily.      Marland Kitchen glipiZIDE (GLUCOTROL) 5 MG tablet Take one tablet by mouth twice daily  60 tablet  4  . glucose blood (BAYER CONTOUR TEST) test strip USE TO TEST GLUCOSE TWICE A DAY  100 each  0  . ibuprofen (ADVIL,MOTRIN) 200 MG tablet Take 200 mg by mouth as needed.        . lansoprazole (PREVACID) 15 MG capsule Take one capsule by mouth one time daily  30 capsule  5  . losartan (COZAAR) 50 MG tablet TAKE ONE TABLET BY MOUTH ONE TIME DAILY   30 tablet  5  . meclizine (ANTIVERT) 25 MG tablet Take 25 mg by mouth every 8 (eight) hours as needed.       . metoprolol succinate (TOPROL-XL) 100 MG 24 hr tablet TAKE ONE TABLET BY MOUTH EVERY MORNING   30 tablet  6  . MICROLET LANCETS MISC Use with bayer contour meter test blood sugar twice daily  100 each  5  . Multiple Vitamins-Minerals (VISION FORMULA) TABS  Take 1 tablet by mouth daily.        Marland Kitchen ZETIA 10 MG tablet TAKE ONE TABLET BY MOUTH EVERY DAY  30 tablet  0  . amoxicillin (AMOXIL) 500 MG capsule Take 1 capsule (500 mg total) by mouth 3 (three) times daily.  30 capsule  0  . [DISCONTINUED] loratadine (CLARITIN) 10 MG tablet Take 10 mg by mouth as needed.        No current facility-administered medications for this visit.    Allergies  Allergen Reactions  . Celecoxib     REACTION: [place]  . Cephalexin     REACTION: trash and swelling  . Ciprofloxacin     REACTION: u/k  . Clopidogrel Bisulfate     REACTION: swelling, rash  . Codeine Phosphate     REACTION: unspecified  . Ezetimibe-Simvastatin     REACTION: myalgias  . Hydrocod Polst-Cpm Polst Er     REACTION: u/k  . Nitrofurantoin     REACTION: [place]  . Pregabalin     REACTION: felt bad  . Propoxyphene N-Acetaminophen     REACTION: [place]  . Rofecoxib   . Shellfish Allergy   . Sulfamethoxazole     REACTION: unspecified  . Sulfonamide Derivatives     REACTION: unspecified     History   Social History  . Marital Status: Married    Spouse Name: N/A    Number of Children: N/A  . Years of Education: N/A   Occupational History  . Retired    Social History Main Topics  . Smoking status: Never Smoker   . Smokeless tobacco: Never Used  . Alcohol Use: No  . Drug Use: No  . Sexual Activity: Not on file   Other Topics Concern  . Not on file   Social History Narrative  . No narrative on file    Family History  Problem Relation Age of Onset  . Cancer Mother     jaw  . Hypertension Father   . Heart attack Father   . Colon cancer Neg Hx   . Heart attack Sister     Review of Systems:  As stated in the HPI and otherwise negative.   BP 146/82  Pulse 64  Ht 5\' 9"  (1.753 m)  Wt 176 lb (79.833 kg)  BMI 25.98 kg/m2  Physical Examination: General: Well developed, well nourished, NAD HEENT: OP clear, mucus membranes moist SKIN: warm, dry. No rashes. Neuro: No focal deficits Musculoskeletal: Muscle strength 5/5 all ext Psychiatric: Mood and affect normal Neck: No JVD, no carotid bruits, no thyromegaly, no lymphadenopathy. Lungs:Clear bilaterally, no wheezes, rhonci, crackles Cardiovascular: Loletha Grayer, Regular rhythm. No murmurs, gallops or rubs. Abdomen:Soft. Bowel sounds present. Non-tender.  Extremities: No lower extremity edema. Pulses are 2 + in the bilateral DP/PT.  Exercise stress myoview: 09/05/13:  Stress Procedure: The patient exercised on the treadmill utilizing the Bruce Protocol for 7:06 minutes. The patient stopped due to sob, jaw pain, chest pressure and chest pain. Technetium 8m Sestamibi was injected at peak exercise and myocardial perfusion imaging was performed after a brief delay.  Stress ECG: No significant change from baseline ECG  QPS  Raw Data Images: Normal; no motion artifact; normal heart/lung ratio.  Stress Images: Normal homogeneous uptake in all areas of the myocardium.  Rest Images: Normal homogeneous uptake in all  areas of the myocardium.  Subtraction (SDS): No evidence of ischemia.  Transient Ischemic Dilatation (Normal <1.22): N/A  Lung/Heart Ratio (Normal <0.45): 0.34  Quantitative Gated Spect  Images  QGS EDV: 53 ml  QGS ESV: 11 ml  Impression  Exercise Capacity: Lexiscan with low level exercise.  BP Response: Normal blood pressure response.  Clinical Symptoms: Significant chest pain.  ECG Impression: No significant ST segment change suggestive of ischemia.  Comparison with Prior Nuclear Study: No significant change from previous study  Overall Impression: Normal stress nuclear study. Patient had chest pain. No EKG changes. Normal perfusion. LV systolic function is vigorous.  LV Ejection Fraction: 80%. LV Wall Motion: NL LV Function; NL Wall Motion  Echo 08/23/13: Left ventricle: The cavity size was normal. There was mild concentric hypertrophy. Systolic function was vigorous. The estimated ejection fraction was in the range of 65% to 70%. Wall motion was normal; there were no regional wall motion abnormalities. Doppler parameters are consistent with abnormal left ventricular relaxation (grade 1 diastolic dysfunction). - Mitral valve: Mildly calcified annulus. Mild regurgitation. - Left atrium: The atrium was mildly to moderately dilated.  Assessment and Plan:   1. CORONARY ARTERY DISEASE: She is still having back and chest pain with exertion.  Stress myoview 09/05/13 without ischemia but this pain is very similar to her prior angina. Will arrange cardiac cath with possible PCI at [facility] on 03/22/14. Risks and benefits reviewed. She agrees to proceed. LV function normal. Continue Zetia, ASA, Toprol. She is intolerant to statins.   2. Hyperlipidemia: Lipids well controlled April 2014 as above. Will continue Zetia. She is intolerant of statins. Repeat lipids and LFTs now.   3. HTN: BP controlled. Continue Cozaar and Toprol.   4. Mitral regurgitation: Mild by echo September 2014. Repeat echo  September 2016.   5. Unstable angina: See above. Plan cath.

## 2014-03-19 ENCOUNTER — Other Ambulatory Visit: Payer: Self-pay | Admitting: Cardiovascular Disease

## 2014-03-19 ENCOUNTER — Telehealth: Payer: Self-pay | Admitting: Family Medicine

## 2014-03-19 NOTE — Telephone Encounter (Signed)
Relevant patient education mailed to patient.  

## 2014-03-22 ENCOUNTER — Ambulatory Visit (HOSPITAL_COMMUNITY)
Admission: RE | Admit: 2014-03-22 | Discharge: 2014-03-22 | Disposition: A | Discharge status: Home / Self Care | Payer: Medicare PPO | Source: Ambulatory Visit | Attending: Cardiovascular Disease | Admitting: Cardiovascular Disease

## 2014-03-22 ENCOUNTER — Encounter (HOSPITAL_COMMUNITY)
Admission: RE | Disposition: A | Discharge status: Home / Self Care | Payer: Self-pay | Source: Ambulatory Visit | Attending: Cardiovascular Disease

## 2014-03-22 DIAGNOSIS — Z9861 Coronary angioplasty status: Secondary | ICD-10-CM | POA: Insufficient documentation

## 2014-03-22 DIAGNOSIS — I2510 Atherosclerotic heart disease of native coronary artery without angina pectoris: Secondary | ICD-10-CM | POA: Insufficient documentation

## 2014-03-22 DIAGNOSIS — Z7982 Long term (current) use of aspirin: Secondary | ICD-10-CM | POA: Insufficient documentation

## 2014-03-22 DIAGNOSIS — Z9221 Personal history of antineoplastic chemotherapy: Secondary | ICD-10-CM | POA: Insufficient documentation

## 2014-03-22 DIAGNOSIS — Z923 Personal history of irradiation: Secondary | ICD-10-CM | POA: Insufficient documentation

## 2014-03-22 DIAGNOSIS — E119 Type 2 diabetes mellitus without complications: Secondary | ICD-10-CM | POA: Insufficient documentation

## 2014-03-22 DIAGNOSIS — I059 Rheumatic mitral valve disease, unspecified: Secondary | ICD-10-CM | POA: Insufficient documentation

## 2014-03-22 DIAGNOSIS — K219 Gastro-esophageal reflux disease without esophagitis: Secondary | ICD-10-CM | POA: Insufficient documentation

## 2014-03-22 DIAGNOSIS — C8589 Other specified types of non-Hodgkin lymphoma, extranodal and solid organ sites: Secondary | ICD-10-CM | POA: Insufficient documentation

## 2014-03-22 DIAGNOSIS — E785 Hyperlipidemia, unspecified: Secondary | ICD-10-CM | POA: Insufficient documentation

## 2014-03-22 DIAGNOSIS — I10 Essential (primary) hypertension: Secondary | ICD-10-CM | POA: Insufficient documentation

## 2014-03-22 DIAGNOSIS — I200 Unstable angina: Secondary | ICD-10-CM | POA: Insufficient documentation

## 2014-03-22 HISTORY — PX: LEFT HEART CATHETERIZATION WITH CORONARY ANGIOGRAM: SHX5451

## 2014-03-22 LAB — GLUCOSE, CAPILLARY
Glucose-Capillary: 113 mg/dL — ABNORMAL HIGH (ref 70–99)
Glucose-Capillary: 98 mg/dL (ref 70–99)

## 2014-03-22 SURGERY — LEFT HEART CATHETERIZATION WITH CORONARY ANGIOGRAM
Anesthesia: LOCAL

## 2014-03-22 MED ORDER — DIPHENHYDRAMINE HCL 50 MG/ML IJ SOLN
50.0000 mg | Freq: Once | INTRAMUSCULAR | Status: AC
  Administered 2014-03-22: 50 mg via INTRAVENOUS

## 2014-03-22 MED ORDER — HEPARIN SODIUM (PORCINE) 1000 UNIT/ML IJ SOLN
INTRAMUSCULAR | Status: AC
  Filled 2014-03-22: qty 1

## 2014-03-22 MED ORDER — MIDAZOLAM HCL 2 MG/2ML IJ SOLN
INTRAMUSCULAR | Status: AC
  Filled 2014-03-22: qty 2

## 2014-03-22 MED ORDER — LIDOCAINE HCL (PF) 1 % IJ SOLN
INTRAMUSCULAR | Status: AC
  Filled 2014-03-22: qty 30

## 2014-03-22 MED ORDER — ASPIRIN 81 MG PO CHEW: 81.0000 mg | CHEWABLE_TABLET | ORAL | Status: DC

## 2014-03-22 MED ORDER — DIPHENHYDRAMINE HCL 50 MG/ML IJ SOLN
INTRAMUSCULAR | Status: AC
  Administered 2014-03-22: 50 mg via INTRAVENOUS
  Filled 2014-03-22: qty 1

## 2014-03-22 MED ORDER — FENTANYL CITRATE 0.05 MG/ML IJ SOLN
INTRAMUSCULAR | Status: AC
Start: 2014-03-22 — End: 2014-03-22
  Filled 2014-03-22: qty 2

## 2014-03-22 MED ORDER — SODIUM CHLORIDE 0.9 % IV SOLN: 250.0000 mL | INTRAVENOUS | Status: DC | PRN

## 2014-03-22 MED ORDER — METHYLPREDNISOLONE SODIUM SUCC 125 MG IJ SOLR
125.0000 mg | Freq: Once | INTRAMUSCULAR | Status: AC
  Administered 2014-03-22: 125 mg via INTRAVENOUS

## 2014-03-22 MED ORDER — FAMOTIDINE IN NACL 20-0.9 MG/50ML-% IV SOLN
20.0000 mg | Freq: Once | INTRAVENOUS | Status: AC
  Administered 2014-03-22: 20 mg via INTRAVENOUS

## 2014-03-22 MED ORDER — NITROGLYCERIN IN D5W 200-5 MCG/ML-% IV SOLN
INTRAVENOUS | Status: AC
  Filled 2014-03-22: qty 250

## 2014-03-22 MED ORDER — SODIUM CHLORIDE 0.9 % IJ SOLN: 3.0000 mL | INTRAMUSCULAR | Status: DC | PRN

## 2014-03-22 MED ORDER — SODIUM CHLORIDE 0.9 % IV SOLN
INTRAVENOUS | Status: DC
  Administered 2014-03-22: 06:00:00 via INTRAVENOUS

## 2014-03-22 MED ORDER — METHYLPREDNISOLONE SODIUM SUCC 125 MG IJ SOLR
INTRAMUSCULAR | Status: AC
  Administered 2014-03-22: 125 mg via INTRAVENOUS
  Filled 2014-03-22: qty 2

## 2014-03-22 MED ORDER — HEPARIN (PORCINE) IN NACL 2-0.9 UNIT/ML-% IJ SOLN
INTRAMUSCULAR | Status: AC
  Filled 2014-03-22: qty 1500

## 2014-03-22 MED ORDER — VERAPAMIL HCL 2.5 MG/ML IV SOLN
INTRAVENOUS | Status: AC
  Filled 2014-03-22: qty 2

## 2014-03-22 MED ORDER — SODIUM CHLORIDE 0.9 % IJ SOLN: 3.0000 mL | Freq: Two times a day (BID) | INTRAMUSCULAR | Status: DC

## 2014-03-22 MED ORDER — HYDRALAZINE HCL 20 MG/ML IJ SOLN
INTRAMUSCULAR | Status: AC
  Filled 2014-03-22: qty 1

## 2014-03-22 MED ORDER — SODIUM CHLORIDE 0.9 % IV SOLN: INTRAVENOUS | Status: AC

## 2014-03-22 MED ORDER — DIAZEPAM 5 MG PO TABS
5.0000 mg | ORAL_TABLET | ORAL | Status: AC
  Administered 2014-03-22: 5 mg via ORAL
  Filled 2014-03-22: qty 1

## 2014-03-22 NOTE — Progress Notes (Signed)
Called to see patient regarding hives, post-cath.  Patient is being given Benadryl, Solu-Medrol and Pepcid.  [name] [name] has had multiple allergic reactions like this, the cause has not always been clear. She is aware of a shrimp allergy, and has reacted to multiple medications in the past. She has never had a reaction to dye before. Today, she was fine post-cath but after she ate a [place] sandwich, she had sudden onset of itching followed shortly by hives and was noted to have swelling in the left side of her jaw. She denies any difficulty breathing or speaking. She has had perioral swelling in the past but does not have it today. She denies shortness of breath. Vital signs are stable. No wheezing on physical exam. She has some generalized erythema and welts are noted, with mild edema to her left jaw and neck.  Continue current medical therapy, patient may need to be discharged with a steroid Dosepak. Monitor and short stay for now. Have encouraged the patient to get an EpiPen from her primary care provider as her current EpiPen as expired.

## 2014-03-22 NOTE — CV Procedure (Addendum)
      Cardiac Catheterization Operative Report  ZAIA CARRE [id number] 4/24/20158:26 AM Arnette Norris, MD  Procedure Performed:  1. Left Heart Catheterization 2. Selective Coronary Angiography 3. Left ventricular angiogram  Operator: Lauree Chandler, MD  Arterial access site:  Right radial artery.   Indication: 80 yo female with history of CAD, HLD, HTN, non-Hodgkin's lymphoma, DM here today for cardiac cath. In 2009 she had tandem non-overlapping Promus drug-eluting stents placed in the LAD. She was evaluated in 2010 for chest pain and had a negative Myoview scan. She has been intolerant to statins and Plavix in the past. She has been on Zetia on doing well. Stress myoview 09/05/13 without ischemia. Echo 08/23/13 with normal LV size and function, mild MR. Recent chest pains c/w unstable angina.                                       Procedure Details: The risks, benefits, complications, treatment options, and expected outcomes were discussed with the patient. The patient and/or family concurred with the proposed plan, giving informed consent. The patient was brought to the cath lab after IV hydration was begun and oral premedication was given. The patient was further sedated with Versed and Fentanyl. The right wrist was assessed with an Allens test which was positive. The right wrist was prepped and draped in a sterile fashion. 1% lidocaine was used for local anesthesia. Using the modified Seldinger access technique, a 5 French sheath was placed in the right radial artery. 3 mg Verapamil was given through the sheath. 4000 units IV heparin was given. Standard diagnostic catheters were used to perform selective coronary angiography. A pigtail catheter was used to perform a left ventricular angiogram. The sheath was removed from the right radial artery and a Terumo hemostasis band was applied at the arteriotomy site on the right wrist.    There were no immediate complications. The patient was  taken to the recovery area in stable condition.   Hemodynamic Findings: Central aortic pressure: 158/78 Left ventricular pressure: 158/16/28  Angiographic Findings:  Left main: No obstructive disease.   Left Anterior Descending Artery: Large caliber vessel that courses to the apex. 30% proximal stenosis. 40% mid stenosis just prior to the stented segment. Mid stented segment patent without restenosis. Distal stented segment patent with 20% restenosis. Apical LAD becomes very small in caliber and has total occlusion with filling of apical vessel by left to left collaterals. (0.75 mm vessel).   Circumflex Artery: Large caliber vessel with several small OM branches. Diffuse luminal irregularities in the mid vessel.   Right Coronary Artery: Large dominant vessel with 20% mid stenosis.   Left Ventricular Angiogram: LVEF=70%.   Impression: 1. Single vessel CAD with patent LAD stents 2. Occlusion of distal/apical LAD, very small in caliber. Too small for PCI 3. Normal LV function  Recommendations: Continue medical management of CAD.        Complications:  None. The patient tolerated the procedure well.

## 2014-03-22 NOTE — Interval H&P Note (Signed)
History and Physical Interval Note:  03/22/2014 7:15 AM  [name]  has presented today for surgery, with the diagnosis of CAD/chest pain.   The various methods of treatment have been discussed with the patient and family. After consideration of risks, benefits and other options for treatment, the patient has consented to  Procedure(s): LEFT HEART CATHETERIZATION WITH CORONARY ANGIOGRAM (N/A) as a surgical intervention .  The patient's history has been reviewed, patient examined, no change in status, stable for surgery.  I have reviewed the patient's chart and labs.  Questions were answered to the patient's satisfaction.    Cath Lab Visit (complete for each Cath Lab visit)  Clinical Evaluation Leading to the Procedure:   ACS: no  Non-ACS:    Anginal Classification: CCS III  Anti-ischemic medical therapy: No Therapy  Non-Invasive Test Results: No non-invasive testing performed  Prior CABG: No previous CABG        [name] [name]

## 2014-03-22 NOTE — Progress Notes (Signed)
Pt c/o swelling to left neck, hives noted to left neck and abdomen.  Dr Angelena Form notified, give benadryl 50 mg IV, solumedrol 125mg  IV, pepcid 20 mg IV.  Will have PA to come assess pt.  Will continue to monitor

## 2014-03-22 NOTE — H&P (View-Only) (Signed)
History of Present Illness: 80 yo female with history of CAD, HLD, HTN, non-Hodgkin's lymphoma, DM here today for cardiac follow up. She has been followed in the past by Dr. Olevia Perches. In 2009 she had tandem non-overlapping Promus drug-eluting stents placed in the LAD. She was evaluated in 2010 for chest pain and had a negative Myoview scan. She has been intolerant to statins and Plavix in the past. She has been on Zetia on doing well. Stress myoview 09/05/13 without ischemia. Echo 08/23/13 with normal LV size and function, mild MR.   She is here for follow up. She found her sister in law dead last week. She has been upset. She has been having severe pains in her back with radiation into her chest. The pain lasts for 10 minutes. This also occurs with exertion.   Primary Care Physician: Arnette Norris  Last Lipid Profile:Lipid Panel     Component Value Date/Time   CHOL 177 02/27/2013 0854   TRIG 270.0* 02/27/2013 0854   HDL 38.80* 02/27/2013 0854   CHOLHDL 5 02/27/2013 0854   VLDL 54.0* 02/27/2013 0854   LDLCALC 89 08/22/2012 0844     Past Medical History  Diagnosis Date  . GERD (gastroesophageal reflux disease)   . CAD (coronary artery disease)   . Urticaria   . Hyperlipidemia   . Hypertension   . Allergy     Cipro, Plavix, Statins  . Personal history of colonic polyps   . Diabetes mellitus     Diagnosed 2012  . NHL (non-Hodgkin's lymphoma) dx'd 2003    chemo/xrt comp 2003  . Proctitis   . Diverticulosis of colon (without mention of hemorrhage)     Past Surgical History  Procedure Laterality Date  . Vaginal hysterectomy      partial, fibroids, one ovary left  . Knee surgery  10/2003    left  . Coronary angioplasty with stent placement      x 2  . Dexa-neg    . Cardiac cath-neg    . Laser surgery for cataracts-left    . Stress cardiolite      Current Outpatient Prescriptions  Medication Sig Dispense Refill  . aspirin (ASPIRIN LOW DOSE) 81 MG tablet Take 81 mg by mouth daily.         . cetirizine (ZYRTEC) 10 MG tablet Take 10 mg by mouth as needed.       Marland Kitchen CRANBERRY PO Take 1 tablet by mouth daily.      Marland Kitchen glipiZIDE (GLUCOTROL) 5 MG tablet Take one tablet by mouth twice daily  60 tablet  4  . glucose blood (BAYER CONTOUR TEST) test strip USE TO TEST GLUCOSE TWICE A DAY  100 each  0  . ibuprofen (ADVIL,MOTRIN) 200 MG tablet Take 200 mg by mouth as needed.        . lansoprazole (PREVACID) 15 MG capsule Take one capsule by mouth one time daily  30 capsule  5  . losartan (COZAAR) 50 MG tablet TAKE ONE TABLET BY MOUTH ONE TIME DAILY   30 tablet  5  . meclizine (ANTIVERT) 25 MG tablet Take 25 mg by mouth every 8 (eight) hours as needed.       . metoprolol succinate (TOPROL-XL) 100 MG 24 hr tablet TAKE ONE TABLET BY MOUTH EVERY MORNING   30 tablet  6  . MICROLET LANCETS MISC Use with bayer contour meter test blood sugar twice daily  100 each  5  . Multiple Vitamins-Minerals (VISION FORMULA) TABS  Take 1 tablet by mouth daily.        . ZETIA 10 MG tablet TAKE ONE TABLET BY MOUTH EVERY DAY  30 tablet  0  . amoxicillin (AMOXIL) 500 MG capsule Take 1 capsule (500 mg total) by mouth 3 (three) times daily.  30 capsule  0  . [DISCONTINUED] loratadine (CLARITIN) 10 MG tablet Take 10 mg by mouth as needed.        No current facility-administered medications for this visit.    Allergies  Allergen Reactions  . Celecoxib     REACTION: [place]  . Cephalexin     REACTION: trash and swelling  . Ciprofloxacin     REACTION: u/k  . Clopidogrel Bisulfate     REACTION: swelling, rash  . Codeine Phosphate     REACTION: unspecified  . Ezetimibe-Simvastatin     REACTION: myalgias  . Hydrocod Polst-Cpm Polst Er     REACTION: u/k  . Nitrofurantoin     REACTION: [place]  . Pregabalin     REACTION: felt bad  . Propoxyphene N-Acetaminophen     REACTION: [place]  . Rofecoxib   . Shellfish Allergy   . Sulfamethoxazole     REACTION: unspecified  . Sulfonamide Derivatives     REACTION: unspecified     History   Social History  . Marital Status: Married    Spouse Name: N/A    Number of Children: N/A  . Years of Education: N/A   Occupational History  . Retired    Social History Main Topics  . Smoking status: Never Smoker   . Smokeless tobacco: Never Used  . Alcohol Use: No  . Drug Use: No  . Sexual Activity: Not on file   Other Topics Concern  . Not on file   Social History Narrative  . No narrative on file    Family History  Problem Relation Age of Onset  . Cancer Mother     jaw  . Hypertension Father   . Heart attack Father   . Colon cancer Neg Hx   . Heart attack Sister     Review of Systems:  As stated in the HPI and otherwise negative.   BP 146/82  Pulse 64  Ht 5' 9" (1.753 m)  Wt 176 lb (79.833 kg)  BMI 25.98 kg/m2  Physical Examination: General: Well developed, well nourished, NAD HEENT: OP clear, mucus membranes moist SKIN: warm, dry. No rashes. Neuro: No focal deficits Musculoskeletal: Muscle strength 5/5 all ext Psychiatric: Mood and affect normal Neck: No JVD, no carotid bruits, no thyromegaly, no lymphadenopathy. Lungs:Clear bilaterally, no wheezes, rhonci, crackles Cardiovascular: Brady, Regular rhythm. No murmurs, gallops or rubs. Abdomen:Soft. Bowel sounds present. Non-tender.  Extremities: No lower extremity edema. Pulses are 2 + in the bilateral DP/PT.  Exercise stress myoview: 09/05/13:  Stress Procedure: The patient exercised on the treadmill utilizing the Bruce Protocol for 7:06 minutes. The patient stopped due to sob, jaw pain, chest pressure and chest pain. Technetium 99m Sestamibi was injected at peak exercise and myocardial perfusion imaging was performed after a brief delay.  Stress ECG: No significant change from baseline ECG  QPS  Raw Data Images: Normal; no motion artifact; normal heart/lung ratio.  Stress Images: Normal homogeneous uptake in all areas of the myocardium.  Rest Images: Normal homogeneous uptake in all  areas of the myocardium.  Subtraction (SDS): No evidence of ischemia.  Transient Ischemic Dilatation (Normal <1.22): N/A  Lung/Heart Ratio (Normal <0.45): 0.34  Quantitative Gated Spect   Images  QGS EDV: 53 ml  QGS ESV: 11 ml  Impression  Exercise Capacity: Lexiscan with low level exercise.  BP Response: Normal blood pressure response.  Clinical Symptoms: Significant chest pain.  ECG Impression: No significant ST segment change suggestive of ischemia.  Comparison with Prior Nuclear Study: No significant change from previous study  Overall Impression: Normal stress nuclear study. Patient had chest pain. No EKG changes. Normal perfusion. LV systolic function is vigorous.  LV Ejection Fraction: 80%. LV Wall Motion: NL LV Function; NL Wall Motion  Echo 08/23/13: Left ventricle: The cavity size was normal. There was mild concentric hypertrophy. Systolic function was vigorous. The estimated ejection fraction was in the range of 65% to 70%. Wall motion was normal; there were no regional wall motion abnormalities. Doppler parameters are consistent with abnormal left ventricular relaxation (grade 1 diastolic dysfunction). - Mitral valve: Mildly calcified annulus. Mild regurgitation. - Left atrium: The atrium was mildly to moderately dilated.  Assessment and Plan:   1. CORONARY ARTERY DISEASE: She is still having back and chest pain with exertion.  Stress myoview 09/05/13 without ischemia but this pain is very similar to her prior angina. Will arrange cardiac cath with possible PCI at [facility] on 03/22/14. Risks and benefits reviewed. She agrees to proceed. LV function normal. Continue Zetia, ASA, Toprol. She is intolerant to statins.   2. Hyperlipidemia: Lipids well controlled April 2014 as above. Will continue Zetia. She is intolerant of statins. Repeat lipids and LFTs now.   3. HTN: BP controlled. Continue Cozaar and Toprol.   4. Mitral regurgitation: Mild by echo September 2014. Repeat echo  September 2016.   5. Unstable angina: See above. Plan cath.  

## 2014-03-22 NOTE — Discharge Instructions (Signed)

## 2014-04-10 ENCOUNTER — Encounter: Payer: Self-pay | Admitting: Family Medicine

## 2014-04-19 ENCOUNTER — Ambulatory Visit (INDEPENDENT_AMBULATORY_CARE_PROVIDER_SITE_OTHER): Payer: Medicare PPO | Admitting: Physician Assistant

## 2014-04-19 ENCOUNTER — Other Ambulatory Visit: Payer: Medicare PPO

## 2014-04-19 ENCOUNTER — Encounter: Payer: Self-pay | Admitting: Physician Assistant

## 2014-04-19 VITALS — BP 136/78 | HR 66 | Ht 69.25 in | Wt 175.0 lb

## 2014-04-19 DIAGNOSIS — R079 Chest pain, unspecified: Secondary | ICD-10-CM

## 2014-04-19 DIAGNOSIS — R7989 Other specified abnormal findings of blood chemistry: Secondary | ICD-10-CM

## 2014-04-19 DIAGNOSIS — I340 Nonrheumatic mitral (valve) insufficiency: Secondary | ICD-10-CM

## 2014-04-19 DIAGNOSIS — E782 Mixed hyperlipidemia: Secondary | ICD-10-CM

## 2014-04-19 DIAGNOSIS — R945 Abnormal results of liver function studies: Secondary | ICD-10-CM

## 2014-04-19 DIAGNOSIS — I059 Rheumatic mitral valve disease, unspecified: Secondary | ICD-10-CM

## 2014-04-19 DIAGNOSIS — I119 Hypertensive heart disease without heart failure: Secondary | ICD-10-CM

## 2014-04-19 DIAGNOSIS — I2510 Atherosclerotic heart disease of native coronary artery without angina pectoris: Secondary | ICD-10-CM

## 2014-04-19 HISTORY — DX: Other specified abnormal findings of blood chemistry: R79.89

## 2014-04-19 LAB — HEPATIC FUNCTION PANEL
ALBUMIN: 4.1 g/dL (ref 3.5–5.2)
ALT: 42 U/L — AB (ref 0–35)
AST: 34 U/L (ref 0–37)
Alkaline Phosphatase: 59 U/L (ref 39–117)
BILIRUBIN TOTAL: 0.8 mg/dL (ref 0.2–1.2)
Bilirubin, Direct: 0.1 mg/dL (ref 0.0–0.3)
Total Protein: 7.1 g/dL (ref 6.0–8.3)

## 2014-04-19 MED ORDER — NITROGLYCERIN 0.4 MG SL SUBL: 0.4000 mg | SUBLINGUAL_TABLET | SUBLINGUAL | Status: DC | PRN

## 2014-04-19 MED ORDER — ISOSORBIDE MONONITRATE ER 30 MG PO TB24: 15.0000 mg | ORAL_TABLET | Freq: Every day | ORAL | Status: DC

## 2014-04-19 NOTE — Progress Notes (Signed)
Cardiology Office Note   Date:  04/19/2014   ID:  MONTEZ STRYKER, DOB 05-24-1932, MRN [id number]  PCP:  Arnette Norris, MD  Cardiologist:  Dr. Lauree Chandler      History of Present Illness: [name] [name] is a 80 y.o. female with a hx of CAD, HTN, HL, non-Hodgkins Lymphoma, DM.  She underwent tandem Promus DES to LAD in 2009 by Dr. Olevia Perches.  She saw Dr. Lauree Chandler 02/2014 with symptoms concerning for recurrent angina.  Cardiac cath was arranged.  This demonstrated a patent LAD stent. She did have an occluded apical LAD with left to left collaterals. This vessel was too small for PCI. She had nonobstructive disease elsewhere. She returns for followup. She is doing about the same. She continues to have interscapular back pain with exertion that radiates to her chest with associated dyspnea. She denies associated nausea, diaphoresis. She denies syncope. She denies orthopnea or PND. She denies edema. She does note some worsening of pain with palpation of her back.   Studies:  - LHC (03/22/14):  prox LAD 30%, mid LAD 40%, LAD stent ok with dist 20% ISR, apical LAD occluded with L-L collats filling apical vessel (too small for PCI), mid RCA 20%, EF 70%.  Med Rx.  - Echo (07/2013):  Mild LVH, vigorous LVF, EF 65-70%, Gr 1 DD, mild MR, mild to mod LAE.   - Nuclear (08/2013):  No ischemia, EF 80%, Normal    Recent Labs: 03/18/2014: ALT 59*; Creatinine 1.1; HDL Cholesterol by NMR 44.60; Hemoglobin 14.5; LDL (calc) 103*; Potassium 5.2*   Wt Readings from Last 3 Encounters:  03/22/14 176 lb (79.833 kg)  03/22/14 176 lb (79.833 kg)  03/18/14 177 lb (80.287 kg)     Past Medical History  Diagnosis Date  . GERD (gastroesophageal reflux disease)   . CAD (coronary artery disease)   . Urticaria   . Hyperlipidemia   . Hypertension   . Allergy     Cipro, Plavix, Statins  . Personal history of colonic polyps   . Diabetes mellitus     Diagnosed 2012  . NHL (non-Hodgkin's lymphoma) dx'd 2003      chemo/xrt comp 2003  . Proctitis   . Diverticulosis of colon (without mention of hemorrhage)     Current Outpatient Prescriptions  Medication Sig Dispense Refill  . aspirin (ASPIRIN LOW DOSE) 81 MG tablet Take 81 mg by mouth daily.        . beta carotene w/minerals (OCUVITE) tablet Take 1 tablet by mouth daily.      . cetirizine (ZYRTEC) 10 MG tablet Take 10 mg by mouth daily as needed for allergies.       Marland Kitchen CRANBERRY PO Take 1 tablet by mouth daily.      Marland Kitchen glipiZIDE (GLUCOTROL) 5 MG tablet Take 5 mg by mouth 2 (two) times daily.      Marland Kitchen glucose blood (BAYER CONTOUR TEST) test strip USE TO TEST GLUCOSE TWICE A DAY  100 each  0  . lansoprazole (PREVACID) 15 MG capsule Take 15 mg by mouth daily at 12 noon.      Marland Kitchen losartan (COZAAR) 50 MG tablet Take 50 mg by mouth daily.      Marland Kitchen MICROLET LANCETS MISC Use with bayer contour meter test blood sugar twice daily  100 each  5  . timolol (TIMOPTIC) 0.25 % ophthalmic solution Place 1 drop into both eyes 2 (two) times daily.       . traZODone (DESYREL) 50  MG tablet Take 1 tablet (50 mg total) by mouth at bedtime as needed for sleep.  30 tablet  3  . ZETIA 10 MG tablet TAKE ONE TABLET BY MOUTH EVERY DAY  30 tablet  1  . [DISCONTINUED] loratadine (CLARITIN) 10 MG tablet Take 10 mg by mouth as needed.        No current facility-administered medications for this visit.    Allergies:   Shellfish allergy; Celecoxib; Cephalexin; Ciprofloxacin; Clopidogrel bisulfate; Codeine phosphate; Ezetimibe-simvastatin; Hydrocod polst-cpm polst er; Nitrofurantoin; Pregabalin; Propoxyphene n-acetaminophen; Rofecoxib; Sulfamethoxazole; and Sulfonamide derivatives   Social History:  The patient  reports that she has never smoked. She has never used smokeless tobacco. She reports that she does not drink alcohol or use illicit drugs.   Family History:  The patient's family history includes Cancer in her mother; Heart attack in her father and sister; Hypertension in her  father. There is no history of Colon cancer.   ROS:  Please see the history of present illness.      All other systems reviewed and negative.   PHYSICAL EXAM: VS:  BP 136/78  Pulse 66  Ht 5' 9.25" (1.759 m)  Wt 175 lb (79.379 kg)  BMI 25.66 kg/m2 Well nourished, well developed, in no acute distress HEENT: normal Neck: no JVD Cardiac:  normal S1, S2; RRR; no murmur Lungs:  clear to auscultation bilaterally, no wheezing, rhonchi or rales Abd: soft, nontender, no hepatomegaly Back: No tenderness to palpation Ext: no edemaright wrist without hematoma or mass  Skin: warm and dry Neuro:  CNs 2-12 intact, no focal abnormalities noted      ASSESSMENT AND PLAN:  1. Chest pain: I suspect that she has stable angina (CCS class II) related to either microvascular ischemia or the occluded apical LAD noted on recent cardiac catheterization. We discussed a trial of nitrates for symptom control. I will start her on Imdur 15 mg daily. She understands that there is a possibility of headache. If this is prohibitive, consider switching to amlodipine. She does have some interscapular back pain that is brought on by palpation. If there is no improvement with medical therapy, consider referral back to primary care for evaluation of back pain. 2. CORONARY ARTERY DISEASE s/p DES x 2 to LAD in 2009:  Patent LAD stent at recent cardiac catheterization. Continue aspirin. She is intolerant of statins. Continue beta blocker. Add nitrates as noted. 3. BEN HTN HEART DISEASE WITHOUT HEART FAIL: Blood pressure controlled. 4. Mixed hyperlipidemia:  Continue Zetia. 5. Mitral regurgitation:  Mild by recent echo 6. Elevated LFTs:  Repeat LFTs today. 7. Disposition: Follow up with Dr. Lauree Chandler in 6 weeks.    Signed, Versie Starks, MHS 04/19/2014 8:26 AM    [facility] [place], [place], [place]  [address] Phone: [phone]; Fax: [phone]

## 2014-04-19 NOTE — Patient Instructions (Addendum)
Your physician has recommended you make the following change in your medication:   1. Start Imdur 15 mg (1/2 tablet) once daily. 2. Start Nitroglycerin 0.4mg  as needed for chest pains  Your physician recommends that you have lab work today: LFT's  Your physician recommends that you schedule a follow-up appointment in: 6 weeks with Dr. Julianne Handler

## 2014-04-23 ENCOUNTER — Other Ambulatory Visit: Payer: Self-pay | Admitting: Family Medicine

## 2014-04-23 ENCOUNTER — Telehealth: Payer: Self-pay | Admitting: *Deleted

## 2014-04-23 ENCOUNTER — Encounter: Payer: Self-pay | Admitting: *Deleted

## 2014-04-23 NOTE — Telephone Encounter (Signed)
This encounter was created in error - please disregard.

## 2014-04-23 NOTE — Telephone Encounter (Signed)
pt notified per Brynda Rim. PA to change imdur to 30 mg daily, pt said ok and thank you.

## 2014-04-23 NOTE — Telephone Encounter (Signed)
pt notified about lab results with verbal understanding. Pt does state to me that she is having a hard time cutting the imdur in 1/2 to = 15 mg, said tablet breaks apart. Pt wondering if she could maybe do 30 mg every other day. I said I will d/w Brynda Rim and cb later today. Pt said ok and thank you. I will fax results to PCP for pt today, pt advised to f/u PCP in regards to minimally elevated LFT.Marland Kitchen

## 2014-05-03 ENCOUNTER — Other Ambulatory Visit: Payer: Self-pay | Admitting: Cardiovascular Disease

## 2014-05-09 ENCOUNTER — Encounter: Payer: Self-pay | Admitting: Family Medicine

## 2014-05-09 ENCOUNTER — Ambulatory Visit (INDEPENDENT_AMBULATORY_CARE_PROVIDER_SITE_OTHER): Payer: Medicare PPO | Admitting: Family Medicine

## 2014-05-09 VITALS — BP 120/76 | HR 76 | Temp 98.3°F | Ht 69.25 in | Wt 178.8 lb

## 2014-05-09 DIAGNOSIS — M7062 Trochanteric bursitis, left hip: Secondary | ICD-10-CM

## 2014-05-09 DIAGNOSIS — M76899 Other specified enthesopathies of unspecified lower limb, excluding foot: Secondary | ICD-10-CM

## 2014-05-09 NOTE — Progress Notes (Signed)
[place] [address] Phone: [phone] Fax: [phone]  Patient ID: [name] [name] MRN: [id number], DOB: 20-Oct-1932, 80 y.o. Date of Encounter: 05/09/2014  Primary Physician:  Arnette Norris, MD   Chief Complaint: Hip Pain   Subjective:   History of Present Illness:  [name] [name] is a 80 y.o. very pleasant female patient who presents with the following:  Left hip - left hip is hurting. All laterally. None in the groin. None in the back or posterior. Has had GTB in the past. Foot pain has gotten better after I made her some orthotics.  GTB  Inj left hip.  Past Medical History, Surgical History, Social History, Family History, Problem List, Medications, and Allergies have been reviewed and updated if relevant.  Review of Systems:  GEN: No fevers, chills. Nontoxic. Primarily MSK c/o today. MSK: Detailed in the HPI GI: tolerating PO intake without difficulty Neuro: No numbness, parasthesias, or tingling associated. Otherwise the pertinent positives of the ROS are noted above.   Objective:   Physical Examination: BP 120/76  Pulse 76  Temp(Src) 98.3 F (36.8 C) (Oral)  Ht 5' 9.25" (1.759 m)  Wt 178 lb 12 oz (81.08 kg)  BMI 26.20 kg/m2   GEN: WDWN, NAD, Non-toxic, Alert & Oriented x 3 HEENT: Atraumatic, Normocephalic.  Ears and Nose: No external deformity. EXTR: No clubbing/cyanosis/edema NEURO: Normal gait.  PSYCH: Normally interactive. Conversant. Not depressed or anxious appearing.  Calm demeanor.   HIP EXAM: SIDE: L ROM: Abduction, Flexion, Internal and External range of motion: full Pain with terminal IROM and EROM: minimal GTB: marked SLR: NEG Knees: No effusion FABER: NT REVERSE FABER: NT, neg Piriformis: NT at direct palpation Str: flexion: 5/5 abduction: 5/5 adduction: 5/5 Strength testing non-tender     Radiology: No results found.  Assessment & Plan:   Trochanteric bursitis of left hip  Trochanteric Bursitis  Injection Verbal consent obtained. Risks (including infection, potential atrophy), benefits, and alternatives reviewed. Greater trochanter sterilely prepped with Chloraprep. Ethyl Chloride used for anesthesia. 8 cc of Lidocaine 1% injected with Depo-Medrol 80 mg into trochanteric bursa at area of maximal tenderness at greater trochanter. Needle taken to bone to troch bursa, flows easily. Bursa massaged. No bleeding and no complications. Decreased pain after injection. Needle: 22 gauge 1 1/2 inch  A rehabilitation program from the [facility] was reviewed with the patient face to face for their condition.   Follow-up: No Follow-up on file. Unless noted above, the patient is to follow-up if symptoms worsen. Red flags were reviewed with the patient.  Signed,  Maud Deed. Savoy Som[place], MD, CAQ Sports Medicine   Discontinued Medications   TIMOLOL (TIMOPTIC) 0.25 % OPHTHALMIC SOLUTION    Place 1 drop into both eyes 2 (two) times daily.    Current Medications at Discharge:   Medication List       This list is accurate as of: 05/09/14  5:12 PM.  Always use your most recent med list.               ASPIRIN LOW DOSE 81 MG tablet  Generic drug:  aspirin  Take 81 mg by mouth daily.     beta carotene w/minerals tablet  Take 1 tablet by mouth daily.     cetirizine 10 MG tablet  Commonly known as:  ZYRTEC  Take 10 mg by mouth daily as needed for allergies.     CRANBERRY PO  Take 1 tablet by mouth daily.  dorzolamide-timolol 22.3-6.8 MG/ML ophthalmic solution  Commonly known as:  COSOPT  Eye drops     glipiZIDE 5 MG tablet  Commonly known as:  GLUCOTROL  Take 5 mg by mouth 2 (two) times daily.     isosorbide mononitrate 30 MG 24 hr tablet  Commonly known as:  IMDUR  Take 0.5 tablets (15 mg total) by mouth daily.     lansoprazole 15 MG capsule  Commonly known as:  PREVACID  Take 15 mg by mouth daily at 12 noon.     losartan 50 MG tablet  Commonly known  as:  COZAAR  TAKE ONE TABLET BY MOUTH EVERY DAY     metoprolol succinate 100 MG 24 hr tablet  Commonly known as:  TOPROL-XL     MICROLET LANCETS Misc  Use with bayer contour meter test blood sugar twice daily     nitroGLYCERIN 0.4 MG SL tablet  Commonly known as:  NITROSTAT  Place 1 tablet (0.4 mg total) under the tongue every 5 (five) minutes as needed for chest pain.     traZODone 50 MG tablet  Commonly known as:  DESYREL  Take 1 tablet (50 mg total) by mouth at bedtime as needed for sleep.     TRUETEST TEST test strip  Generic drug:  glucose blood  TEST TWICE DAILY     ZETIA 10 MG tablet  Generic drug:  ezetimibe  TAKE ONE TABLET BY MOUTH EVERY DAY

## 2014-05-09 NOTE — Progress Notes (Signed)
Pre visit review using our clinic review tool, if applicable. No additional management support is needed unless otherwise documented below in the visit note. 

## 2014-05-27 ENCOUNTER — Other Ambulatory Visit: Payer: Self-pay | Admitting: Cardiovascular Disease

## 2014-05-30 ENCOUNTER — Ambulatory Visit: Payer: Medicare PPO | Admitting: Family Medicine

## 2014-06-03 ENCOUNTER — Other Ambulatory Visit: Payer: Self-pay | Admitting: Cardiovascular Disease

## 2014-06-04 ENCOUNTER — Ambulatory Visit (INDEPENDENT_AMBULATORY_CARE_PROVIDER_SITE_OTHER): Payer: Medicare PPO | Admitting: Family Medicine

## 2014-06-04 ENCOUNTER — Encounter: Payer: Self-pay | Admitting: Family Medicine

## 2014-06-04 VITALS — BP 118/66 | HR 69 | Temp 98.1°F | Wt 177.8 lb

## 2014-06-04 DIAGNOSIS — R5383 Other fatigue: Secondary | ICD-10-CM

## 2014-06-04 DIAGNOSIS — M25551 Pain in right hip: Secondary | ICD-10-CM | POA: Insufficient documentation

## 2014-06-04 DIAGNOSIS — R5382 Chronic fatigue, unspecified: Secondary | ICD-10-CM | POA: Insufficient documentation

## 2014-06-04 DIAGNOSIS — E0921 Drug or chemical induced diabetes mellitus with diabetic nephropathy: Secondary | ICD-10-CM

## 2014-06-04 DIAGNOSIS — E1329 Other specified diabetes mellitus with other diabetic kidney complication: Secondary | ICD-10-CM

## 2014-06-04 DIAGNOSIS — M25552 Pain in left hip: Secondary | ICD-10-CM

## 2014-06-04 DIAGNOSIS — M25559 Pain in unspecified hip: Secondary | ICD-10-CM

## 2014-06-04 DIAGNOSIS — N058 Unspecified nephritic syndrome with other morphologic changes: Secondary | ICD-10-CM

## 2014-06-04 DIAGNOSIS — R5381 Other malaise: Secondary | ICD-10-CM | POA: Insufficient documentation

## 2014-06-04 LAB — COMPREHENSIVE METABOLIC PANEL
ALT: 44 U/L — AB (ref 0–35)
AST: 38 U/L — ABNORMAL HIGH (ref 0–37)
Albumin: 3.9 g/dL (ref 3.5–5.2)
Alkaline Phosphatase: 49 U/L (ref 39–117)
BUN: 17 mg/dL (ref 6–23)
CO2: 30 meq/L (ref 19–32)
CREATININE: 1.2 mg/dL (ref 0.4–1.2)
Calcium: 9.6 mg/dL (ref 8.4–10.5)
Chloride: 102 mEq/L (ref 96–112)
GFR: 48.21 mL/min — AB (ref >60.00)
Glucose, Bld: 161 mg/dL — ABNORMAL HIGH (ref 70–99)
Potassium: 4.9 mEq/L (ref 3.5–5.1)
Sodium: 137 mEq/L (ref 135–145)
Total Bilirubin: 0.6 mg/dL (ref 0.2–1.2)
Total Protein: 6.8 g/dL (ref 6.0–8.3)

## 2014-06-04 LAB — CBC WITH DIFFERENTIAL/PLATELET
Basophils Absolute: 0 10*3/uL (ref 0.0–0.1)
Basophils Relative: 0.4 % (ref 0.0–3.0)
EOS ABS: 0.1 10*3/uL (ref 0.0–0.7)
Eosinophils Relative: 1.6 % (ref 0.0–5.0)
HEMATOCRIT: 41.4 % (ref 36.0–46.0)
Hemoglobin: 13.9 g/dL (ref 12.0–15.0)
Lymphocytes Relative: 45.6 % (ref 12.0–46.0)
Lymphs Abs: 1.9 10*3/uL (ref 0.7–4.0)
MCHC: 33.5 g/dL (ref 30.0–36.0)
MCV: 98.8 fl (ref 78.0–100.0)
MONO ABS: 0.5 10*3/uL (ref 0.1–1.0)
Monocytes Relative: 11.2 % (ref 3.0–12.0)
NEUTROS PCT: 41.2 % — AB (ref 43.0–77.0)
Neutro Abs: 1.7 10*3/uL (ref 1.4–7.7)
PLATELETS: 203 10*3/uL (ref 150.0–400.0)
RBC: 4.19 Mil/uL (ref 3.87–5.11)
RDW: 14.6 % (ref 11.5–15.5)
WBC: 4.1 10*3/uL (ref 4.0–10.5)

## 2014-06-04 LAB — TSH: TSH: 0.76 u[IU]/mL (ref 0.35–4.50)

## 2014-06-04 LAB — HEMOGLOBIN A1C: HEMOGLOBIN A1C: 7.1 % — AB (ref 4.6–6.5)

## 2014-06-04 LAB — LDL CHOLESTEROL, DIRECT: LDL DIRECT: 97.1 mg/dL

## 2014-06-04 LAB — VITAMIN B12: Vitamin B-12: 306 pg/mL (ref 211–911)

## 2014-06-04 NOTE — Assessment & Plan Note (Signed)
?  SI joint pain vs trochanteric bursitis. Advised continued management by Dr. [name]. She will schedule appt on her way out today.

## 2014-06-04 NOTE — Progress Notes (Signed)
Subjective:   Patient ID: [name] [name], female    DOB: [birth year], 80 y.o.   MRN: [id number]  [name] [name] is a pleasant 80 y.o. year old female who presents to clinic today with Hip Pain  on 06/04/2014  HPI: Here for a few issues:  1.  Hip pain- saw Dr. [name] last month for trochanteric bursitis, left.  Received hip injection at that OV (tihs was his second one).  Felt this one didn't work for as long as previous one. Now right hip is hurting but radiating more towards her groin.  Says she recently saw Dr. Risa Grill and was told "not a kidney issue."  2.  DM-  Feels sugars have been controlled.  No longer checking FSBS daily. On Glucotrol 5 mg daily.  Overdue for a1c. Lab Results  Component Value Date   HGBA1C 6.0 02/27/2013   Lab Results  Component Value Date   CHOL 177 03/18/2014   HDL 44.60 03/18/2014   LDLCALC 103* 03/18/2014   LDLDIRECT 102.5 02/27/2013   TRIG 146.0 03/18/2014   CHOLHDL 4 03/18/2014   3.  Fatigue- has been very fatigued lately, "no get up and go."  Denies any blood in stool.  No recent CP or SOB.  Sleeping ok.  Current Outpatient Prescriptions on File Prior to Visit  Medication Sig Dispense Refill  . aspirin (ASPIRIN LOW DOSE) 81 MG tablet Take 81 mg by mouth daily.        . beta carotene w/minerals (OCUVITE) tablet Take 1 tablet by mouth daily.      . cetirizine (ZYRTEC) 10 MG tablet Take 10 mg by mouth daily as needed for allergies.       Marland Kitchen CRANBERRY PO Take 1 tablet by mouth daily.      . dorzolamide-timolol (COSOPT) 22.3-6.8 MG/ML ophthalmic solution Eye drops      . glipiZIDE (GLUCOTROL) 5 MG tablet Take 5 mg by mouth 2 (two) times daily.      . isosorbide mononitrate (IMDUR) 30 MG 24 hr tablet Take 0.5 tablets (15 mg total) by mouth daily.  30 tablet  3  . lansoprazole (PREVACID) 15 MG capsule Take 15 mg by mouth daily at 12 noon.      Marland Kitchen losartan (COZAAR) 50 MG tablet TAKE ONE TABLET BY MOUTH EVERY DAY  30 tablet  0  . metoprolol succinate (TOPROL-XL) 100  MG 24 hr tablet       . MICROLET LANCETS MISC Use with bayer contour meter test blood sugar twice daily  100 each  5  . nitroGLYCERIN (NITROSTAT) 0.4 MG SL tablet Place 1 tablet (0.4 mg total) under the tongue every 5 (five) minutes as needed for chest pain.  30 tablet  3  . traZODone (DESYREL) 50 MG tablet Take 1 tablet (50 mg total) by mouth at bedtime as needed for sleep.  30 tablet  3  . TRUETEST TEST test strip TEST TWICE DAILY  100 each  0  . ZETIA 10 MG tablet TAKE ONE TABLET BY MOUTH EVERY DAY  30 tablet  0  . [DISCONTINUED] loratadine (CLARITIN) 10 MG tablet Take 10 mg by mouth as needed.        No current facility-administered medications on file prior to visit.    Allergies  Allergen Reactions  . Shellfish Allergy Anaphylaxis  . Celecoxib     REACTION: [place]  . Cephalexin     REACTION: trash and swelling  . Ciprofloxacin     REACTION: u/k  .  Clopidogrel Bisulfate     REACTION: swelling, rash  . Codeine Phosphate     REACTION: unspecified  . Ezetimibe-Simvastatin     REACTION: myalgias  . Hydrocod Polst-Cpm Polst Er     REACTION: u/k  . Nitrofurantoin     REACTION: [place]  . Pregabalin     REACTION: felt bad  . Propoxyphene N-Acetaminophen     REACTION: [place]  . Rofecoxib     unk   . Sulfamethoxazole     REACTION: unspecified  . Sulfonamide Derivatives     REACTION: unspecified    Past Medical History  Diagnosis Date  . GERD (gastroesophageal reflux disease)   . CAD (coronary artery disease)   . Urticaria   . Hyperlipidemia   . Hypertension   . Allergy     Cipro, Plavix, Statins  . Personal history of colonic polyps   . Diabetes mellitus     Diagnosed 2012  . NHL (non-Hodgkin's lymphoma) dx'd 2003    chemo/xrt comp 2003  . Proctitis   . Diverticulosis of colon (without mention of hemorrhage)     Past Surgical History  Procedure Laterality Date  . Vaginal hysterectomy      partial, fibroids, one ovary left  . Knee surgery  10/2003    left  . Coronary  angioplasty with stent placement      x 2  . Dexa-neg    . Cardiac cath-neg    . Laser surgery for cataracts-left    . Stress cardiolite      Family History  Problem Relation Age of [place]set  . Cancer Mother     jaw  . Hypertension Father   . Heart attack Father   . Colon cancer Neg Hx   . Heart attack Sister     History   Social History  . Marital Status: Married    Spouse Name: N/A    Number of Children: N/A  . Years of Education: N/A   Occupational History  . Retired    Social History Main Topics  . Smoking status: Never Smoker   . Smokeless tobacco: Never Used  . Alcohol Use: No  . Drug Use: No  . Sexual Activity: Not on file   Other Topics Concern  . Not on file   Social History Narrative  . No narrative on file   The PMH, PSH, Social History, Family History, Medications, and allergies have been reviewed in [facility], and have been updated if relevant.   Review of Systems    See HPI No HA No blurred vision No tingling in extremities  Objective:    BP 118/66  Pulse 69  Temp(Src) 98.1 F (36.7 C) (Oral)  Wt 177 lb 12 oz (80.627 kg)  SpO2 97%   Physical Exam  Nursing note and vitals reviewed. Constitutional: She appears well-developed and well-nourished. No distress.  HENT:  Head: Normocephalic and atraumatic.  Neck: Normal range of motion.  Musculoskeletal:  No TTP over right bursa. Complaining more of pain in SI joint Normal gait  Psychiatric: She has a normal mood and affect. Her behavior is normal. Judgment and thought content normal.          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

## 2014-06-04 NOTE — Assessment & Plan Note (Addendum)
Recheck a1c today. 

## 2014-06-04 NOTE — Patient Instructions (Signed)
Good to see you. I will call you with your lab results.  Have a wonderful trip.  Make an appointment with Dr. [name].

## 2014-06-04 NOTE — Progress Notes (Signed)
Pre visit review using our clinic review tool, if applicable. No additional management support is needed unless otherwise documented below in the visit note. 

## 2014-06-04 NOTE — Assessment & Plan Note (Signed)
Likely multifactorial. Check labs today. Orders Placed This Encounter  Procedures  . Hemoglobin A1c  . Comprehensive metabolic panel  . Vitamin B12  . TSH  . CBC with Differential  . LDL Cholesterol, Direct

## 2014-06-05 ENCOUNTER — Encounter: Payer: Self-pay | Admitting: Physician Assistant

## 2014-06-05 ENCOUNTER — Ambulatory Visit (INDEPENDENT_AMBULATORY_CARE_PROVIDER_SITE_OTHER): Payer: Medicare PPO | Admitting: Physician Assistant

## 2014-06-05 ENCOUNTER — Ambulatory Visit: Payer: Medicare PPO | Admitting: Physician Assistant

## 2014-06-05 VITALS — BP 120/67 | HR 71 | Ht 69.25 in | Wt 177.0 lb

## 2014-06-05 DIAGNOSIS — I25119 Atherosclerotic heart disease of native coronary artery with unspecified angina pectoris: Secondary | ICD-10-CM

## 2014-06-05 DIAGNOSIS — E782 Mixed hyperlipidemia: Secondary | ICD-10-CM

## 2014-06-05 DIAGNOSIS — I209 Angina pectoris, unspecified: Secondary | ICD-10-CM

## 2014-06-05 DIAGNOSIS — I2510 Atherosclerotic heart disease of native coronary artery without angina pectoris: Secondary | ICD-10-CM

## 2014-06-05 DIAGNOSIS — I119 Hypertensive heart disease without heart failure: Secondary | ICD-10-CM

## 2014-06-05 NOTE — Progress Notes (Signed)
Cardiology Office Note   Date:  06/05/2014   ID:  [name] [name], DOB [birth year], MRN [id number]  PCP:  Arnette Norris, MD  Cardiologist:  Dr. Lauree Chandler      History of Present Illness: [name] [name] is a 80 y.o. female with a hx of CAD, HTN, HL, non-Hodgkins Lymphoma, DM.  She underwent tandem Promus DES to LAD in 2009 by Dr. Olevia Perches.  She is intolerant to statins.  She saw Dr. Lauree Chandler 02/2014 with symptoms concerning for recurrent angina.  Cardiac cath was arranged.  This demonstrated a patent LAD stent. She did have an occluded apical LAD with left to left collaterals. This vessel was too small for PCI. She had nonobstructive disease elsewhere.  I saw her 04/19/14.  She continued to have interscapular back pain with exertion that radiated to her chest.  I placed her on Imdur.  She is brought back today for follow up.    She is feeling much better.  She has not had back and chest pain as much.  She has had one or 2 episodes since starting on the Imdur.  She denies dyspnea, orthopnea, PND, edema, syncope.     Studies:  - LHC (03/22/14):  prox LAD 30%, mid LAD 40%, LAD stent ok with dist 20% ISR, apical LAD occluded with L-L collats filling apical vessel (too small for PCI), mid RCA 20%, EF 70%.  Med Rx.  - Echo (07/2013):  Mild LVH, vigorous LVF, EF 65-70%, Gr 1 DD, mild MR, mild to mod LAE.   - Nuclear (08/2013):  No ischemia, EF 80%, Normal    Recent Labs: 03/18/2014: HDL Cholesterol by NMR 44.60; LDL (calc) 103*  06/04/2014: ALT 44*; Creatinine 1.2; Direct LDL 97.1; Hemoglobin 13.9; Potassium 4.9; TSH 0.76   Wt Readings from Last 3 Encounters:  06/05/14 177 lb (80.287 kg)  06/04/14 177 lb 12 oz (80.627 kg)  05/09/14 178 lb 12 oz (81.08 kg)     Past Medical History  Diagnosis Date  . GERD (gastroesophageal reflux disease)   . CAD (coronary artery disease)     a. s/p tandem Promus DES to LAD in 2009 by Dr. Olevia Perches;  b.  LHC (03/22/14):  prox LAD 30%, mid LAD 40%, LAD  stent ok with dist 20% ISR, apical LAD occluded with L-L collats filling apical vessel (too small for PCI), mid RCA 20%, EF 70%.  Med Rx  . Urticaria   . Hyperlipidemia     intol of statins  . Hypertension   . Allergy     Cipro, Plavix, Statins  . Personal history of colonic polyps   . Diabetes mellitus     Diagnosed 2012  . NHL (non-Hodgkin's lymphoma) dx'd 2003    chemo/xrt comp 2003  . Proctitis   . Diverticulosis of colon (without mention of hemorrhage)   . Hx of echocardiogram     a. Echo (07/2013):  Mild LVH, vigorous LVF, EF 65-70%, Gr 1 DD, mild MR, mild to mod LAE  . Hx of cardiovascular stress test     a. Nuclear (08/2013):  No ischemia, EF 80%, Normal    Current Outpatient Prescriptions  Medication Sig Dispense Refill  . aspirin (ASPIRIN LOW DOSE) 81 MG tablet Take 81 mg by mouth daily.        . beta carotene w/minerals (OCUVITE) tablet Take 1 tablet by mouth daily.      . cetirizine (ZYRTEC) 10 MG tablet Take 10 mg by mouth daily as  needed for allergies.       Marland Kitchen CRANBERRY PO Take 1 tablet by mouth daily.      . dorzolamide-timolol (COSOPT) 22.3-6.8 MG/ML ophthalmic solution Place 1 drop into both eyes 2 (two) times daily. Eye drops      . glipiZIDE (GLUCOTROL) 5 MG tablet Take 5 mg by mouth 2 (two) times daily.      . isosorbide mononitrate (IMDUR) 30 MG 24 hr tablet Take 0.5 tablets (15 mg total) by mouth daily.  30 tablet  3  . lansoprazole (PREVACID) 15 MG capsule Take 15 mg by mouth daily at 12 noon.      Marland Kitchen losartan (COZAAR) 50 MG tablet TAKE ONE TABLET BY MOUTH EVERY DAY  30 tablet  5  . metoprolol succinate (TOPROL-XL) 100 MG 24 hr tablet       . MICROLET LANCETS MISC Use with bayer contour meter test blood sugar twice daily  100 each  5  . nitroGLYCERIN (NITROSTAT) 0.4 MG SL tablet Place 1 tablet (0.4 mg total) under the tongue every 5 (five) minutes as needed for chest pain.  30 tablet  3  . traZODone (DESYREL) 50 MG tablet Take 1 tablet (50 mg total) by mouth at  bedtime as needed for sleep.  30 tablet  3  . TRUETEST TEST test strip TEST TWICE DAILY  100 each  0  . ZETIA 10 MG tablet TAKE ONE TABLET BY MOUTH EVERY DAY  30 tablet  0  . [DISCONTINUED] loratadine (CLARITIN) 10 MG tablet Take 10 mg by mouth as needed.        No current facility-administered medications for this visit.    Allergies:   Shellfish allergy; Celecoxib; Cephalexin; Ciprofloxacin; Clopidogrel bisulfate; Codeine phosphate; Ezetimibe-simvastatin; Hydrocod polst-cpm polst er; Nitrofurantoin; Pregabalin; Propoxyphene n-acetaminophen; Rofecoxib; Sulfamethoxazole; and Sulfonamide derivatives   Social History:  The patient  reports that she has never smoked. She has never used smokeless tobacco. She reports that she does not drink alcohol or use illicit drugs.   Family History:  The patient's family history includes Cancer in her mother; Heart attack in her father and sister; Hypertension in her father. There is no history of Colon cancer.   ROS:  Please see the history of present illness.      All other systems reviewed and negative.   PHYSICAL EXAM: VS:  BP 120/67  Pulse 71  Ht 5' 9.25" (1.759 m)  Wt 177 lb (80.287 kg)  BMI 25.95 kg/m2  SpO2 96% Well nourished, well developed, in no acute distress HEENT: normal Neck: no JVD Cardiac:  normal S1, S2; RRR; no murmur Lungs:  clear to auscultation bilaterally, no wheezing, rhonchi or rales Abd: soft, nontender, no hepatomegaly Back: No tenderness to palpation Ext: no edema  Skin: warm and dry Neuro:  CNs 2-12 intact, no focal abnormalities noted       ASSESSMENT AND PLAN:  1. CORONARY ARTERY DISEASE s/p DES x 2 to LAD in 2009:   She has stable angina that is much improved on nitrate Rx.  Continue current Rx.  I gave her the option to increase her Imdur to 30 mg QD if she needs to.  She will call us to get her Rx changed if she chooses to do so.  Continue aspirin, beta blocker, nitrates. 2. BEN HTN HEART DISEASE WITHOUT HEART  FAIL: Blood pressure controlled. 3. Mixed hyperlipidemia:  Intol of statins.  Recent LDL ok.  Continue Zetia. 4. Mitral regurgitation:  Mild by recent echo  5. Elevated LFTs:  F/u with PCP.  6. Disposition: Follow up with Dr. Lauree Chandler 6 mos.    Signed, Versie Starks, MHS 06/05/2014 12:26 PM    [address], [place], [place]  [address] Phone: [phone]; Fax: [phone]

## 2014-06-05 NOTE — Patient Instructions (Signed)
Your physician recommends that you continue on your current medications as directed. Please refer to the Current Medication list given to you today.  Your physician wants you to follow-up in: 6 months with Dr Shirley Friar will receive a reminder letter in the mail two months in advance. If you don't receive a letter, please call our office to schedule the follow-up appointment.

## 2014-06-10 ENCOUNTER — Ambulatory Visit (INDEPENDENT_AMBULATORY_CARE_PROVIDER_SITE_OTHER): Payer: Medicare PPO | Admitting: Family Medicine

## 2014-06-10 ENCOUNTER — Encounter: Payer: Self-pay | Admitting: Family Medicine

## 2014-06-10 VITALS — BP 110/68 | HR 65 | Temp 98.4°F | Ht 69.25 in

## 2014-06-10 DIAGNOSIS — M545 Low back pain, unspecified: Secondary | ICD-10-CM

## 2014-06-10 DIAGNOSIS — M76899 Other specified enthesopathies of unspecified lower limb, excluding foot: Secondary | ICD-10-CM

## 2014-06-10 DIAGNOSIS — M7062 Trochanteric bursitis, left hip: Secondary | ICD-10-CM

## 2014-06-10 NOTE — Progress Notes (Signed)
[place] [address] Phone: [phone] Fax: [phone]  Patient ID: [name] [name] MRN: [id number], DOB: [birth year], 80 y.o. Date of Encounter: 06/10/2014  Primary Physician:  Arnette Norris, MD   Chief Complaint: Hip Pain   Subjective:   History of Present Illness:  [name] [name] is a 80 y.o. very pleasant female patient who presents with the following:  Lives near [place].  L GTB - going to [place].   Pleasant 80 year old patient who has some bilateral chronic back pain in the lumbar spine area, and she also has had some trochanteric bursitis on the left side. I have injected her hip previously on the left, most recently one month ago. She also has some posterior pain, buttocks pain, and SI joint pain bilaterally right now.  Past Medical History, Surgical History, Social History, Family History, Problem List, Medications, and Allergies have been reviewed and updated if relevant.  Review of Systems:  GEN: No fevers, chills. Nontoxic. Primarily MSK c/o today. MSK: Detailed in the HPI GI: tolerating PO intake without difficulty Neuro: No numbness, parasthesias, or tingling associated. Otherwise the pertinent positives of the ROS are noted above.   Objective:   Physical Examination: BP 110/68  Pulse 65  Temp(Src) 98.4 F (36.9 C) (Oral)  Ht 5' 9.25" (1.759 m)   GEN: WDWN, NAD, Non-toxic, Alert & Oriented x 3 HEENT: Atraumatic, Normocephalic.  Ears and Nose: No external deformity. EXTR: No clubbing/cyanosis/edema NEURO: Normal gait.  PSYCH: Normally interactive. Conversant. Not depressed or anxious appearing.  Calm demeanor.   HIP EXAM: SIDE: L ROM: Abduction, Flexion, Internal and External range of motion: full Pain with terminal IROM and EROM: mild GTB: TTP on the L SLR: NEG Knees: No effusion FABER: NT REVERSE FABER: NT, neg Piriformis: NT at direct palpation Str: flexion: 5/5 abduction: 5/5 adduction: 5/5 Strength testing  non-tender     Radiology: No results found.  Assessment & Plan:   Trochanteric bursitis of left hip - Plan: Ambulatory referral to Physical Therapy  Bilateral low back pain without sciatica - Plan: Ambulatory referral to Physical Therapy  She also has some concomitant SI joint dysfunction. Some basic physical therapy would likely help. She was not really able to do the home exercise program that I gave to her last time. For this 80 year old, formal physical therapy would be helpful.  Trochanteric Bursitis Injection, LEFT Verbal consent obtained. Risks (including infection, potential atrophy), benefits, and alternatives reviewed. Greater trochanter sterilely prepped with Chloraprep. Ethyl Chloride used for anesthesia. 8 cc of Lidocaine 1% injected with Depo-Medrol 80 mg into trochanteric bursa at area of maximal tenderness at greater trochanter. Needle taken to bone to troch bursa, flows easily. Bursa massaged. No bleeding and no complications. Decreased pain after injection. Needle: 22 gauge spinal needle   New Prescriptions   No medications on file   Modified Medications   No medications on file   Orders Placed This Encounter  Procedures  . Ambulatory referral to Physical Therapy   Follow-up: No Follow-up on file. Unless noted above, the patient is to follow-up if symptoms worsen. Red flags were reviewed with the patient.  Signed,  Maud Deed. Eleisha Branscomb, MD, CAQ Sports Medicine   Discontinued Medications   No medications on file   Current Medications at Discharge:   Medication List       This list is accurate as of: 06/10/14  1:37 PM.  Always use your most recent med list.  ASPIRIN LOW DOSE 81 MG tablet  Generic drug:  aspirin  Take 81 mg by mouth daily.     beta carotene w/minerals tablet  Take 1 tablet by mouth daily.     cetirizine 10 MG tablet  Commonly known as:  ZYRTEC  Take 10 mg by mouth daily as needed for allergies.     CRANBERRY PO    Take 1 tablet by mouth daily.     dorzolamide-timolol 22.3-6.8 MG/ML ophthalmic solution  Commonly known as:  COSOPT  Place 1 drop into both eyes 2 (two) times daily. Eye drops     glipiZIDE 5 MG tablet  Commonly known as:  GLUCOTROL  Take 5 mg by mouth 2 (two) times daily.     isosorbide mononitrate 30 MG 24 hr tablet  Commonly known as:  IMDUR  Take 0.5 tablets (15 mg total) by mouth daily.     lansoprazole 15 MG capsule  Commonly known as:  PREVACID  Take 15 mg by mouth daily at 12 noon.     losartan 50 MG tablet  Commonly known as:  COZAAR  TAKE ONE TABLET BY MOUTH EVERY DAY     metoprolol succinate 100 MG 24 hr tablet  Commonly known as:  TOPROL-XL     MICROLET LANCETS Misc  Use with bayer contour meter test blood sugar twice daily     nitroGLYCERIN 0.4 MG SL tablet  Commonly known as:  NITROSTAT  Place 1 tablet (0.4 mg total) under the tongue every 5 (five) minutes as needed for chest pain.     traZODone 50 MG tablet  Commonly known as:  DESYREL  Take 1 tablet (50 mg total) by mouth at bedtime as needed for sleep.     TRUETEST TEST test strip  Generic drug:  glucose blood  TEST TWICE DAILY     vitamin B-12 500 MCG tablet  Commonly known as:  CYANOCOBALAMIN  Take 500 mcg by mouth daily.     ZETIA 10 MG tablet  Generic drug:  ezetimibe  TAKE ONE TABLET BY MOUTH EVERY DAY

## 2014-06-10 NOTE — Progress Notes (Signed)
Pre visit review using our clinic review tool, if applicable. No additional management support is needed unless otherwise documented below in the visit note. 

## 2014-06-10 NOTE — Patient Instructions (Signed)
REFERRALS TO SPECIALISTS, SPECIAL TESTS (MRI, CT, ULTRASOUNDS)  GO THE WAITING ROOM AND TELL CHECK IN YOU NEED HELP WITH A REFERRAL. Either MARION or LINDA will help you set it up.  If it is between 1-2 PM they may be at lunch.  After 5 PM, they will likely be at home.  They will call you, so please make sure the office has your correct phone number.  Referrals sometimes can be done same day if urgent, but others can take 2 or 3 days to get an appointment. Starting in 2015, many of the new Medicare insurance plans and Affordable Care Act (Obamacare) Health plans offered take much longer for referrals. They have added additional paperwork and steps.  MRI's and CT's can take up to a week for the test. (Emergencies like strokes take precedence. I will tell you if you have an emergency.)   If your referral is to an in-network [facility] office, their office may contact you directly prior to our office reaching you.  -- Examples: [facility], [facility], [facility], [facility], [facility], and many more.  Specialist appointment times vary a great deal, mostly on the specialist's schedule and if they have openings. -- Our office tries to get you in as fast as possible. -- Some specialists have very long wait times. (Example. Dermatology. Usually months) -- If you have a true emergency like new cancer, we work to get you in ASAP.   

## 2014-06-15 ENCOUNTER — Other Ambulatory Visit: Payer: Self-pay | Admitting: Cardiovascular Disease

## 2014-06-19 ENCOUNTER — Other Ambulatory Visit: Payer: Self-pay | Admitting: Family Medicine

## 2014-06-25 ENCOUNTER — Other Ambulatory Visit: Payer: Self-pay | Admitting: Family Medicine

## 2014-06-27 ENCOUNTER — Ambulatory Visit (INDEPENDENT_AMBULATORY_CARE_PROVIDER_SITE_OTHER): Payer: Medicare PPO | Admitting: Family Medicine

## 2014-06-27 ENCOUNTER — Encounter: Payer: Self-pay | Admitting: Family Medicine

## 2014-06-27 VITALS — BP 118/64 | HR 62 | Temp 98.4°F | Ht 69.25 in | Wt 176.0 lb

## 2014-06-27 DIAGNOSIS — M7750 Other enthesopathy of unspecified foot: Secondary | ICD-10-CM

## 2014-06-27 DIAGNOSIS — M7741 Metatarsalgia, right foot: Secondary | ICD-10-CM

## 2014-06-27 NOTE — Progress Notes (Signed)
Pre visit review using our clinic review tool, if applicable. No additional management support is needed unless otherwise documented below in the visit note. 

## 2014-06-27 NOTE — Progress Notes (Signed)
[place] [address] Phone: [phone] Fax: [phone]  Patient ID: [name] [name] MRN: [id number], DOB: January 14, 1932, 80 y.o. Date of Encounter: 06/27/2014  Primary Physician:  Arnette Norris, MD   Chief Complaint: Foot Swelling   Subjective:   History of Present Illness:  [name] [name] is a 80 y.o. very pleasant female patient who presents with the following:  Tuesday night R lateral ankle started to swell and started to bother her and more in the forefoot. Not really bothering her. Does feel a little bit tight.  Laid down and elevated. Wanted me to check - worried.  Had a good time on vacation.  No h/o gout.   Other joints feeling a little bit better.   Past Medical History, Surgical History, Social History, Family History, Problem List, Medications, and Allergies have been reviewed and updated if relevant.  Review of Systems:  GEN: No fevers, chills. Nontoxic. Primarily MSK c/o today. MSK: Detailed in the HPI GI: tolerating PO intake without difficulty Neuro: No numbness, parasthesias, or tingling associated. Otherwise the pertinent positives of the ROS are noted above.   Objective:   Physical Examination: BP 118/64  Pulse 62  Temp(Src) 98.4 F (36.9 C) (Oral)  Ht 5' 9.25" (1.759 m)  Wt 176 lb (79.833 kg)  BMI 25.80 kg/m2   GEN: Well-developed,well-nourished,in no acute distress; alert,appropriate and cooperative throughout examination HEENT: Normocephalic and atraumatic without obvious abnormalities. Ears, externally no deformities PULM: Breathing comfortably in no respiratory distress EXT: No clubbing, cyanosis, or edema PSYCH: Normally interactive. Cooperative during the interview. Pleasant. Friendly and conversant. Not anxious or depressed appearing. Normal, full affect.  ANKLE: R Echymosis: no Edema: forefoot slight and lateral ankle ROM: Full dorsi and plantar flexion, inversion, eversion Gait: heel toe, non-antalgic Lateral Mall:  NT Medial Mall: NT Talus: NT Navicular: NT Cuboid: NT Calcaneous: NT Metatarsals: mild swelling without focal shaft tenderness. 5th MT: NT Phalanges: NT Achilles: NT Plantar Fascia: NT Fat Pad: NT Peroneals: NT Post Tib: NT Great Toe: Nml motion Ant Drawer: neg Talar Tilt: neg ATFL: mild ttp CFL: mild ttp Deltoid: NT Str: 5/5 Other Special tests: none Sensation: intact   Radiology: No results found.  Assessment & Plan:   Metatarsalgia of right foot  Not overly concerned. Recent trip likely aggravated.  Ice TID for the next 5 days before next trip.  Placed a MT bar on orthotic.  New Prescriptions   No medications on file   Modified Medications   No medications on file   No orders of the defined types were placed in this encounter.   Follow-up: No Follow-up on file. Unless noted above, the patient is to follow-up if symptoms worsen. Red flags were reviewed with the patient.  Signed,  Maud Deed. Eissa Buchberger, MD, CAQ Sports Medicine   Discontinued Medications   No medications on file   Current Medications at Discharge:   Medication List       This list is accurate as of: 06/27/14 11:59 PM.  Always use your most recent med list.               ASPIRIN LOW DOSE 81 MG tablet  Generic drug:  aspirin  Take 81 mg by mouth daily.     beta carotene w/minerals tablet  Take 1 tablet by mouth daily.     cetirizine 10 MG tablet  Commonly known as:  ZYRTEC  Take 10 mg by mouth daily as needed for allergies.  CRANBERRY PO  Take 1 tablet by mouth daily.     dorzolamide-timolol 22.3-6.8 MG/ML ophthalmic solution  Commonly known as:  COSOPT  Place 1 drop into both eyes 2 (two) times daily. Eye drops     glipiZIDE 5 MG tablet  Commonly known as:  GLUCOTROL  TAKE ONE TABLET BY MOUTH TWICE DAILY     isosorbide mononitrate 30 MG 24 hr tablet  Commonly known as:  IMDUR  Take 0.5 tablets (15 mg total) by mouth daily.     lansoprazole 15 MG capsule   Commonly known as:  PREVACID  TAKE ONE CAPSULE BY MOUTH EVERY DAY     losartan 50 MG tablet  Commonly known as:  COZAAR  TAKE ONE TABLET BY MOUTH EVERY DAY     metoprolol succinate 100 MG 24 hr tablet  Commonly known as:  TOPROL-XL     MICROLET LANCETS Misc  Use with bayer contour meter test blood sugar twice daily     nitroGLYCERIN 0.4 MG SL tablet  Commonly known as:  NITROSTAT  Place 1 tablet (0.4 mg total) under the tongue every 5 (five) minutes as needed for chest pain.     traZODone 50 MG tablet  Commonly known as:  DESYREL  Take 1 tablet (50 mg total) by mouth at bedtime as needed for sleep.     TRUETEST TEST test strip  Generic drug:  glucose blood  TEST TWICE DAILY     vitamin B-12 500 MCG tablet  Commonly known as:  CYANOCOBALAMIN  Take 500 mcg by mouth daily.     ZETIA 10 MG tablet  Generic drug:  ezetimibe  TAKE ONE TABLET BY MOUTH EVERY DAY

## 2014-07-01 ENCOUNTER — Other Ambulatory Visit: Payer: Self-pay | Admitting: Cardiovascular Disease

## 2014-07-08 ENCOUNTER — Telehealth: Payer: Self-pay | Admitting: Family Medicine

## 2014-07-08 NOTE — Telephone Encounter (Signed)
Pt has ? On bill 05/09/14. Insurance states she has 40 copay  Her insurance card says 20  After 10 please call  [phone]

## 2014-07-15 ENCOUNTER — Telehealth: Payer: Self-pay | Admitting: Family Medicine

## 2014-07-15 NOTE — Telephone Encounter (Signed)
Patient Information:  Caller Name: Braylin  Phone: [phone]  Patient: [name] [name], [name] [name]  Gender: Female  DOB: [birth year]  Age: 80 Years  PCP: Arnette Norris [facility])  Office Follow Up:  Does the office need to follow up with this patient?: No  Instructions For The Office: N/A  RN Note:  Appointment already scheduled with Dr. Deborra Medina on 07/18/14 at 12:30pm. Advised patient to keep scheduled appointment and to call back prior to appointment if any symptoms worsen or any new symptoms develop.  Symptoms  Reason For Call & Symptoms: Reports swelling and pain to the right foot. Was seen by Dr. [name] approximately two week ago for the same symptoms.  Reviewed Health History In EMR: Yes  Reviewed Medications In EMR: Yes  Reviewed Allergies In EMR: Yes  Reviewed Surgeries / Procedures: Yes  Date of Onset of Symptoms: 07/01/2014  Treatments Tried: ice packs and elevating the legs  Treatments Tried Worked: Yes  Guideline(s) Used:  Ankle Pain  Disposition Per Guideline:   See Within 3 Days in Office  Reason For Disposition Reached:   Moderate pain (e.g., interferes with normal activities, limping) and present > 3 days  Advice Given:  Pain Medicines:  Ibuprofen (e.g., Motrin, Advil):  Take 400 mg (two 200 mg pills) by mouth every 6 hours.  The most you should take each day is 1,200 mg (six 200 mg pills), unless your doctor has told you to take more.  Patient Will Follow Care Advice:  YES

## 2014-07-18 ENCOUNTER — Encounter: Payer: Self-pay | Admitting: Family Medicine

## 2014-07-18 ENCOUNTER — Ambulatory Visit (INDEPENDENT_AMBULATORY_CARE_PROVIDER_SITE_OTHER): Payer: Medicare PPO | Admitting: Family Medicine

## 2014-07-18 ENCOUNTER — Ambulatory Visit (INDEPENDENT_AMBULATORY_CARE_PROVIDER_SITE_OTHER)
Admission: RE | Admit: 2014-07-18 | Discharge: 2014-07-18 | Disposition: A | Discharge status: Home / Self Care | Payer: Medicare PPO | Source: Ambulatory Visit | Attending: Family Medicine | Admitting: Family Medicine

## 2014-07-18 ENCOUNTER — Encounter (INDEPENDENT_AMBULATORY_CARE_PROVIDER_SITE_OTHER): Payer: Self-pay

## 2014-07-18 VITALS — BP 116/70 | HR 67 | Temp 98.0°F | Wt 179.5 lb

## 2014-07-18 DIAGNOSIS — M25571 Pain in right ankle and joints of right foot: Secondary | ICD-10-CM | POA: Insufficient documentation

## 2014-07-18 DIAGNOSIS — M25579 Pain in unspecified ankle and joints of unspecified foot: Secondary | ICD-10-CM

## 2014-07-18 DIAGNOSIS — L304 Erythema intertrigo: Secondary | ICD-10-CM

## 2014-07-18 DIAGNOSIS — L538 Other specified erythematous conditions: Secondary | ICD-10-CM

## 2014-07-18 HISTORY — DX: Erythema intertrigo: L30.4

## 2014-07-18 LAB — COMPREHENSIVE METABOLIC PANEL
ALBUMIN: 3.8 g/dL (ref 3.5–5.2)
ALT: 45 U/L — AB (ref 0–35)
AST: 37 U/L (ref 0–37)
Alkaline Phosphatase: 55 U/L (ref 39–117)
BUN: 24 mg/dL — ABNORMAL HIGH (ref 6–23)
CALCIUM: 8.9 mg/dL (ref 8.4–10.5)
CHLORIDE: 104 meq/L (ref 96–112)
CO2: 29 meq/L (ref 19–32)
Creatinine, Ser: 1.1 mg/dL (ref 0.4–1.2)
GFR: 49.18 mL/min — AB (ref >60.00)
Glucose, Bld: 186 mg/dL — ABNORMAL HIGH (ref 70–99)
POTASSIUM: 4.5 meq/L (ref 3.5–5.1)
SODIUM: 139 meq/L (ref 135–145)
TOTAL PROTEIN: 6.3 g/dL (ref 6.0–8.3)
Total Bilirubin: 0.5 mg/dL (ref 0.2–1.2)

## 2014-07-18 LAB — URIC ACID: URIC ACID, SERUM: 4 mg/dL (ref 2.4–7.0)

## 2014-07-18 MED ORDER — NYSTATIN 100000 UNIT/GM EX POWD: Freq: Four times a day (QID) | CUTANEOUS | Status: DC

## 2014-07-18 NOTE — Progress Notes (Signed)
Pre visit review using our clinic review tool, if applicable. No additional management support is needed unless otherwise documented below in the visit note. 

## 2014-07-18 NOTE — Assessment & Plan Note (Signed)
New- advised to stop abx ointment. Nystatin powder to area three times daily - four times daily and try to keep area dry. Call or return to clinic prn if these symptoms worsen or fail to improve as anticipated. The patient indicates understanding of these issues and agrees with the plan.

## 2014-07-18 NOTE — Patient Instructions (Signed)
Great to see you. Use Nystatin powder on your rash up to four times a day for no longer than a week without calling me.  Ask for [facility].  I will call you with your results.

## 2014-07-18 NOTE — Assessment & Plan Note (Signed)
Deteriorated with progressive pain and edema. Unclear etiology at this point. Xray and labs (uric acid to rule out gout). Advised to d/c MT pad as it seems to be aggravating her symptoms. The patient indicates understanding of these issues and agrees with the plan.

## 2014-07-18 NOTE — Progress Notes (Signed)
Subjective:   Patient ID: [name] [name], female    DOB: [birth year], 80 y.o.   MRN: [id number]  [name] [name] is a pleasant 80 y.o. year old female who presents to clinic today with Foot Swelling  on 07/18/2014  HPI: Saw Dr. [name] for this complaint on 05/3014- note reviewed. He felt she had metatarsalgia of her right foot that was aggravated by increased walking on her recent trip.  He advised ice .itd for 5 days before her next trip and gave her a MT bar on orthotic.  She is here today because she feels symptoms are actually getting worse.  More pain in foot and now toes are tingling since she put orthotic MT in.  No h/o gout.  Lab Results  Component Value Date   WBC 4.1 06/04/2014   HGB 13.9 06/04/2014   HCT 41.4 06/04/2014   MCV 98.8 06/04/2014   PLT 203.0 06/04/2014   Rash under left breast- very itchy.  Noticed it a few days ago.  Antibiotic ointment not helping. Not painful, not draining.   Current Outpatient Prescriptions on File Prior to Visit  Medication Sig Dispense Refill  . aspirin (ASPIRIN LOW DOSE) 81 MG tablet Take 81 mg by mouth daily.        . beta carotene w/minerals (OCUVITE) tablet Take 1 tablet by mouth daily.      . cetirizine (ZYRTEC) 10 MG tablet Take 10 mg by mouth daily as needed for allergies.       Marland Kitchen CRANBERRY PO Take 1 tablet by mouth daily.      . dorzolamide-timolol (COSOPT) 22.3-6.8 MG/ML ophthalmic solution Place 1 drop into both eyes 2 (two) times daily. Eye drops      . glipiZIDE (GLUCOTROL) 5 MG tablet TAKE ONE TABLET BY MOUTH TWICE DAILY  60 tablet  2  . isosorbide mononitrate (IMDUR) 30 MG 24 hr tablet Take 0.5 tablets (15 mg total) by mouth daily.  30 tablet  3  . lansoprazole (PREVACID) 15 MG capsule TAKE ONE CAPSULE BY MOUTH EVERY DAY  30 capsule  3  . losartan (COZAAR) 50 MG tablet TAKE ONE TABLET BY MOUTH EVERY DAY  30 tablet  5  . metoprolol succinate (TOPROL-XL) 100 MG 24 hr tablet TAKE ONE TABLET BY MOUTH EVERY MORNING  30 tablet  5  .  MICROLET LANCETS MISC Use with bayer contour meter test blood sugar twice daily  100 each  5  . nitroGLYCERIN (NITROSTAT) 0.4 MG SL tablet Place 1 tablet (0.4 mg total) under the tongue every 5 (five) minutes as needed for chest pain.  30 tablet  3  . traZODone (DESYREL) 50 MG tablet Take 1 tablet (50 mg total) by mouth at bedtime as needed for sleep.  30 tablet  3  . TRUETEST TEST test strip TEST TWICE DAILY  100 each  1  . vitamin B-12 (CYANOCOBALAMIN) 500 MCG tablet Take 500 mcg by mouth daily.      Marland Kitchen ZETIA 10 MG tablet TAKE ONE TABLET BY MOUTH EVERY DAY  30 tablet  6  . [DISCONTINUED] loratadine (CLARITIN) 10 MG tablet Take 10 mg by mouth as needed.        No current facility-administered medications on file prior to visit.    Allergies  Allergen Reactions  . Shellfish Allergy Anaphylaxis  . Celecoxib     REACTION: [place]  . Cephalexin     REACTION: trash and swelling  . Ciprofloxacin     REACTION:  u/k  . Clopidogrel Bisulfate     REACTION: swelling, rash  . Codeine Phosphate     REACTION: unspecified  . Ezetimibe-Simvastatin     REACTION: myalgias  . Hydrocod Polst-Cpm Polst Er     REACTION: u/k  . Nitrofurantoin     REACTION: [place]  . Pregabalin     REACTION: felt bad  . Propoxyphene N-Acetaminophen     REACTION: [place]  . Rofecoxib     unk   . Sulfamethoxazole     REACTION: unspecified  . Sulfonamide Derivatives     REACTION: unspecified    Past Medical History  Diagnosis Date  . GERD (gastroesophageal reflux disease)   . CAD (coronary artery disease)     a. s/p tandem Promus DES to LAD in 2009 by Dr. Olevia Perches;  b.  LHC (03/22/14):  prox LAD 30%, mid LAD 40%, LAD stent ok with dist 20% ISR, apical LAD occluded with L-L collats filling apical vessel (too small for PCI), mid RCA 20%, EF 70%.  Med Rx  . Urticaria   . Hyperlipidemia     intol of statins  . Hypertension   . Allergy     Cipro, Plavix, Statins  . Personal history of colonic polyps   . Diabetes mellitus      Diagnosed 2012  . NHL (non-Hodgkin's lymphoma) dx'd 2003    chemo/xrt comp 2003  . Proctitis   . Diverticulosis of colon (without mention of hemorrhage)   . Hx of echocardiogram     a. Echo (07/2013):  Mild LVH, vigorous LVF, EF 65-70%, Gr 1 DD, mild MR, mild to mod LAE  . Hx of cardiovascular stress test     a. Nuclear (08/2013):  No ischemia, EF 80%, Normal    Past Surgical History  Procedure Laterality Date  . Vaginal hysterectomy      partial, fibroids, one ovary left  . Knee surgery  10/2003    left  . Coronary angioplasty with stent placement      x 2  . Dexa-neg    . Cardiac cath-neg    . Laser surgery for cataracts-left    . Stress cardiolite      Family History  Problem Relation Age of Onset  . Cancer Mother     jaw  . Hypertension Father   . Heart attack Father   . Colon cancer Neg Hx   . Heart attack Sister     History   Social History  . Marital Status: Married    Spouse Name: N/A    Number of Children: N/A  . Years of Education: N/A   Occupational History  . Retired    Social History Main Topics  . Smoking status: Never Smoker   . Smokeless tobacco: Never Used  . Alcohol Use: No  . Drug Use: No  . Sexual Activity: Not on file   Other Topics Concern  . Not on file   Social History Narrative  . No narrative on file   The PMH, PSH, Social History, Family History, Medications, and allergies have been reviewed in [facility], and have been updated if relevant.  Review of Systems    See HPI No CP No SOB No fevers + intermittent redness over right ankle  No other rashes Objective:    BP 116/70  Pulse 67  Temp(Src) 98 F (36.7 C) (Oral)  Wt 179 lb 8 oz (81.421 kg)  SpO2 95%   Physical Exam  Nursing note and vitals reviewed. Constitutional:  She is oriented to person, place, and time. She appears well-developed and well-nourished. No distress.  HENT:  Head: Normocephalic.  Musculoskeletal: Normal range of motion.       Right ankle: She  exhibits swelling. She exhibits normal range of motion, no ecchymosis and normal pulse. Tenderness. Lateral malleolus tenderness found.  Neurological: She is alert and oriented to person, place, and time.  Skin: Rash noted.  Erythematous area (1.5 cm) under left breast.  No pustules or drainage          Assessment & Plan:   Arthralgia of right foot No Follow-up on file.

## 2014-07-24 ENCOUNTER — Encounter: Payer: Self-pay | Admitting: Family Medicine

## 2014-07-24 ENCOUNTER — Ambulatory Visit (INDEPENDENT_AMBULATORY_CARE_PROVIDER_SITE_OTHER): Payer: Medicare PPO | Admitting: Family Medicine

## 2014-07-24 VITALS — BP 130/80 | HR 64 | Temp 98.1°F | Ht 69.25 in | Wt 180.0 lb

## 2014-07-24 DIAGNOSIS — M79671 Pain in right foot: Secondary | ICD-10-CM

## 2014-07-24 DIAGNOSIS — M79609 Pain in unspecified limb: Secondary | ICD-10-CM

## 2014-07-24 NOTE — Progress Notes (Signed)
Pre visit review using our clinic review tool, if applicable. No additional management support is needed unless otherwise documented below in the visit note. 

## 2014-07-24 NOTE — Progress Notes (Signed)
Dr. Frederico Hamman T. Braylon Lemmons, MD, [facility] [place], [address] Phone: [phone] Fax: [phone]  07/24/2014  Patient: [name] [name], MRN: [id number], DOB: 1932/07/13  Primary Physician:  Arnette Norris, MD  Chief Complaint: Foot Swelling  Subjective:   [name] [name] is a 80 y.o. very pleasant female patient who presents with the following:  R foot. Pleasant patient, who is known well and I saw her about 1 months previously, and I thought that she was having some metatarsalgia. At that point put on a metatarsal bar, and adjusted her orthotic.  She is still having some pain and some swelling in the forefoot region on the right. X-rays from last week are negative. These were all independently reviewed by myself and reviewed with the patient in the room.  Past Medical History, Surgical History, Social History, Family History, Problem List, Medications, and Allergies have been reviewed and updated if relevant.  GEN: No fevers, chills. Nontoxic. Primarily MSK c/o today. MSK: Detailed in the HPI GI: tolerating PO intake without difficulty Neuro: No numbness, parasthesias, or tingling associated. Otherwise the pertinent positives of the ROS are noted above.   Objective:   BP 130/80  Pulse 64  Temp(Src) 98.1 F (36.7 C) (Oral)  Ht 5' 9.25" (1.759 m)  Wt 180 lb (81.647 kg)  BMI 26.39 kg/m2   GEN: WDWN, NAD, Non-toxic, Alert & Oriented x 3 HEENT: Atraumatic, Normocephalic.  Ears and Nose: No external deformity. EXTR: No clubbing/cyanosis/edema NEURO: Normal gait.  PSYCH: Normally interactive. Conversant. Not depressed or anxious appearing.  Calm demeanor.    The forefoot on the right compared to left is mildly swollen, mostly on 2,3, 4 metatarsals. This is mostly distal. She is at some mild tenderness in loss of motion at the first MCP. She has diffuse onychomycosis. She has tenderness along the second metatarsal shaft.  There is also some less tenderness on the third metatarsal shaft.  Radiology: Dg Foot Complete Right  07/18/2014   CLINICAL DATA:  Pain and swelling  EXAM: RIGHT FOOT COMPLETE - 3+ VIEW  COMPARISON:  January 18, 2012  FINDINGS: Frontal, oblique, and lateral views were obtained. There is no fracture or dislocation. There is mild hallux valgus deformity at the first MTP joint. There is no appreciable joint space narrowing. There is a spur arising from the inferior calcaneus.  IMPRESSION: No fracture or dislocation. Inferior calcaneal spur. Mild hallux valgus deformity first MTP joint.   Electronically Signed   By: Lowella Grip M.D.   On: 07/18/2014 13:26    Assessment and Plan:   Foot pain, right  Concern for metatarsal stress reaction. All place the patient in a postoperative shoe for the next 4 weeks.  We discussed potentially getting an MRI of the foot, and both of [place] agreed that treating clinically at this point is most appropriate.  Follow-up: Return in about 1 month (around 08/24/2014).  New Prescriptions   No medications on file   No orders of the defined types were placed in this encounter.    Signed,  Maud Deed. Chaddrick Brue, MD   Patient's Medications  New Prescriptions   No medications on file  Previous Medications   ASPIRIN (ASPIRIN LOW DOSE) 81 MG TABLET    Take 81 mg by mouth daily.     BETA CAROTENE W/MINERALS (OCUVITE) TABLET    Take 1 tablet by mouth daily.   CETIRIZINE (ZYRTEC) 10 MG TABLET    Take  10 mg by mouth daily as needed for allergies.    CRANBERRY PO    Take 1 tablet by mouth daily.   DORZOLAMIDE-TIMOLOL (COSOPT) 22.3-6.8 MG/ML OPHTHALMIC SOLUTION    Place 1 drop into both eyes 2 (two) times daily. Eye drops   GLIPIZIDE (GLUCOTROL) 5 MG TABLET    TAKE ONE TABLET BY MOUTH TWICE DAILY   ISOSORBIDE MONONITRATE (IMDUR) 30 MG 24 HR TABLET    Take 0.5 tablets (15 mg total) by mouth daily.   LANSOPRAZOLE (PREVACID) 15 MG CAPSULE    TAKE ONE CAPSULE BY MOUTH  EVERY DAY   LOSARTAN (COZAAR) 50 MG TABLET    TAKE ONE TABLET BY MOUTH EVERY DAY   METOPROLOL SUCCINATE (TOPROL-XL) 100 MG 24 HR TABLET    TAKE ONE TABLET BY MOUTH EVERY MORNING   MICROLET LANCETS MISC    Use with bayer contour meter test blood sugar twice daily   NITROGLYCERIN (NITROSTAT) 0.4 MG SL TABLET    Place 1 tablet (0.4 mg total) under the tongue every 5 (five) minutes as needed for chest pain.   NYSTATIN (MYCOSTATIN) POWDER    Apply topically 4 (four) times daily.   TRAZODONE (DESYREL) 50 MG TABLET    Take 1 tablet (50 mg total) by mouth at bedtime as needed for sleep.   TRUETEST TEST TEST STRIP    TEST TWICE DAILY   VITAMIN B-12 (CYANOCOBALAMIN) 500 MCG TABLET    Take 500 mcg by mouth daily.   ZETIA 10 MG TABLET    TAKE ONE TABLET BY MOUTH EVERY DAY  Modified Medications   No medications on file  Discontinued Medications   No medications on file

## 2014-08-12 ENCOUNTER — Ambulatory Visit: Payer: Medicare PPO

## 2014-08-26 ENCOUNTER — Ambulatory Visit (INDEPENDENT_AMBULATORY_CARE_PROVIDER_SITE_OTHER): Payer: Medicare PPO | Admitting: Family Medicine

## 2014-08-26 ENCOUNTER — Encounter: Payer: Self-pay | Admitting: Family Medicine

## 2014-08-26 VITALS — BP 110/64 | HR 65 | Temp 98.2°F | Ht 69.25 in | Wt 179.5 lb

## 2014-08-26 DIAGNOSIS — Z23 Encounter for immunization: Secondary | ICD-10-CM

## 2014-08-26 DIAGNOSIS — M79671 Pain in right foot: Secondary | ICD-10-CM

## 2014-08-26 DIAGNOSIS — M79609 Pain in unspecified limb: Secondary | ICD-10-CM

## 2014-08-26 NOTE — Patient Instructions (Signed)
Peroneal Tendon irritation:  Towel "Scrunch Ups" Use a hand towel or a moderate size towel Foot flat down on the towel Use toes to "scrunch up the towel" straight up and down, and going to the right and left.  3 sets of 20 * Can be done watching TV, reading, or sitting and relaxing.   Balance Exercises:  While brushing teeth, practice standing on 1 foot. Do for as long as you can, ideally 30 seconds to 1 minute each foot.

## 2014-08-26 NOTE — Progress Notes (Signed)
Pre visit review using our clinic review tool, if applicable. No additional management support is needed unless otherwise documented below in the visit note. 

## 2014-08-26 NOTE — Progress Notes (Signed)
Dr. Frederico Hamman T. Tanish Sinkler, MD, [facility] [place], [address] Phone: [phone] Fax: [phone]  08/26/2014  Patient: [name] [name], MRN: [id number], DOB: 25-May-1932  Primary Physician:  Arnette Norris, MD  Chief Complaint: Follow-up  Subjective:   [name] [name] is a 80 y.o. very pleasant female patient who presents with the following:  Much improved, she has been compliant. Forefoot does not hurt now, but mildly laterally.  07/24/2014 Last OV with Owens Loffler, MD  R foot. Pleasant patient, who is known well and I saw her about 1 months previously, and I thought that she was having some metatarsalgia. At that point put on a metatarsal bar, and adjusted her orthotic.  She is still having some pain and some swelling in the forefoot region on the right. X-rays from last week are negative. These were all independently reviewed by myself and reviewed with the patient in the room.  Past Medical History, Surgical History, Social History, Family History, Problem List, Medications, and Allergies have been reviewed and updated if relevant.  GEN: No fevers, chills. Nontoxic. Primarily MSK c/o today. MSK: Detailed in the HPI GI: tolerating PO intake without difficulty Neuro: No numbness, parasthesias, or tingling associated. Otherwise the pertinent positives of the ROS are noted above.   Objective:   BP 110/64  Pulse 65  Temp(Src) 98.2 F (36.8 C) (Oral)  Ht 5' 9.25" (1.759 m)  Wt 179 lb 8 oz (81.421 kg)  BMI 26.32 kg/m2   GEN: WDWN, NAD, Non-toxic, Alert & Oriented x 3 HEENT: Atraumatic, Normocephalic.  Ears and Nose: No external deformity. EXTR: No clubbing/cyanosis/edema NEURO: Normal gait.  PSYCH: Normally interactive. Conversant. Not depressed or anxious appearing.  Calm demeanor.    Entire forefoot is NT now. Ankle NT. Malleoli NT. PT NT. Peroneal mildly tender on the R str 5/5 neurovasc intact  Radiology: Dg  Foot Complete Right  07/18/2014   CLINICAL DATA:  Pain and swelling  EXAM: RIGHT FOOT COMPLETE - 3+ VIEW  COMPARISON:  January 18, 2012  FINDINGS: Frontal, oblique, and lateral views were obtained. There is no fracture or dislocation. There is mild hallux valgus deformity at the first MTP joint. There is no appreciable joint space narrowing. There is a spur arising from the inferior calcaneus.  IMPRESSION: No fracture or dislocation. Inferior calcaneal spur. Mild hallux valgus deformity first MTP joint.   Electronically Signed   By: Lowella Grip M.D.   On: 07/18/2014 13:26    Assessment and Plan:   Foot pain, right  Need for prophylactic vaccination and inoculation against influenza - Plan: Flu Vaccine QUAD 36+ mos IM  Need for prophylactic vaccination against Streptococcus pneumoniae (pneumococcus) - Plan: Pneumococcal conjugate vaccine 13-valent  MT stress rx improved.   Mild peroneal tendonitis.   Out of post-op shoe. Rehab.  Patient Instructions  Peroneal Tendon irritation:  Towel "Scrunch Ups" Use a hand towel or a moderate size towel Foot flat down on the towel Use toes to "scrunch up the towel" straight up and down, and going to the right and left.  3 sets of 20 * Can be done watching TV, reading, or sitting and relaxing.   Balance Exercises:  While brushing teeth, practice standing on 1 foot. Do for as long as you can, ideally 30 seconds to 1 minute each foot.       New Prescriptions   No medications on file   Orders Placed This Encounter  Procedures  . Flu Vaccine QUAD 36+ mos IM  . Pneumococcal conjugate vaccine 13-valent    Signed,  Makailyn Mccormick T. Pelagia Iacobucci, MD   Patient's Medications  New Prescriptions   No medications on file  Previous Medications   ASPIRIN (ASPIRIN LOW DOSE) 81 MG TABLET    Take 81 mg by mouth daily.     BETA CAROTENE W/MINERALS (OCUVITE) TABLET    Take 1 tablet by mouth daily.   CETIRIZINE (ZYRTEC) 10 MG TABLET    Take 10 mg by  mouth daily as needed for allergies.    CRANBERRY PO    Take 1 tablet by mouth daily.   DORZOLAMIDE-TIMOLOL (COSOPT) 22.3-6.8 MG/ML OPHTHALMIC SOLUTION    Place 1 drop into both eyes 2 (two) times daily. Eye drops   GLIPIZIDE (GLUCOTROL) 5 MG TABLET    TAKE ONE TABLET BY MOUTH TWICE DAILY   ISOSORBIDE MONONITRATE (IMDUR) 30 MG 24 HR TABLET    Take 0.5 tablets (15 mg total) by mouth daily.   LANSOPRAZOLE (PREVACID) 15 MG CAPSULE    TAKE ONE CAPSULE BY MOUTH EVERY DAY   LOSARTAN (COZAAR) 50 MG TABLET    TAKE ONE TABLET BY MOUTH EVERY DAY   METOPROLOL SUCCINATE (TOPROL-XL) 100 MG 24 HR TABLET    TAKE ONE TABLET BY MOUTH EVERY MORNING   MICROLET LANCETS MISC    Use with bayer contour meter test blood sugar twice daily   NITROGLYCERIN (NITROSTAT) 0.4 MG SL TABLET    Place 1 tablet (0.4 mg total) under the tongue every 5 (five) minutes as needed for chest pain.   NYSTATIN (MYCOSTATIN) POWDER    Apply topically 4 (four) times daily.   TRAZODONE (DESYREL) 50 MG TABLET    Take 1 tablet (50 mg total) by mouth at bedtime as needed for sleep.   TRUETEST TEST TEST STRIP    TEST TWICE DAILY   VITAMIN B-12 (CYANOCOBALAMIN) 500 MCG TABLET    Take 500 mcg by mouth daily.   ZETIA 10 MG TABLET    TAKE ONE TABLET BY MOUTH EVERY DAY  Modified Medications   No medications on file  Discontinued Medications   No medications on file

## 2014-09-02 ENCOUNTER — Encounter: Payer: Self-pay | Admitting: Family Medicine

## 2014-09-02 ENCOUNTER — Ambulatory Visit (INDEPENDENT_AMBULATORY_CARE_PROVIDER_SITE_OTHER): Payer: Medicare PPO | Admitting: Family Medicine

## 2014-09-02 VITALS — BP 140/88 | HR 87 | Temp 98.2°F | Wt 177.2 lb

## 2014-09-02 DIAGNOSIS — T7840XA Allergy, unspecified, initial encounter: Secondary | ICD-10-CM | POA: Insufficient documentation

## 2014-09-02 MED ORDER — PREDNISONE 20 MG PO TABS: ORAL_TABLET | ORAL | Status: DC

## 2014-09-02 NOTE — Progress Notes (Signed)
Pre visit review using our clinic review tool, if applicable. No additional management support is needed unless otherwise documented below in the visit note. 

## 2014-09-02 NOTE — Assessment & Plan Note (Signed)
New- Somewhat delayed response. Will place on course of prednisone, continue antihistamines. I do want her to call me in the morning- if symptoms are not improving or worsening, will add abx for possible cellulitis.  Given her multiple abx allergies, I would prefer to hold off on that if possible. The patient indicates understanding of these issues and agrees with the plan.

## 2014-09-02 NOTE — Progress Notes (Signed)
Subjective:   Patient ID: [name] [name], female    DOB: 1932/06/12, 80 y.o.   MRN: [id number]  [name] [name] is a pleasant 80 y.o. year old female who presents to clinic today with Arm Pain and Edema  on 09/02/2014  HPI: Has multiple drug allergies that involve itching, hives, swelling.  Received a flu shot in her left arm and prevnar 13 vaccination in her right arm here on 9/28.    Felt like her right arm was "heavy" for a day or two but there was no rash.  Then two days, large, baseball sized red, warm area appeared around her immunization site on her right arm (where she received prevnar 13).  It is itchy and growing in size.  Did take some benadryl over the weekend.  Had body aches and low grade temp over the weekend but those symptoms have resolved.  Feels rash is growing in size and getting more red and warm to touch.  No bug bites or other trauma to her right arm.  No hives, swelling of throat, nausea or vomiting.  Has never had allergic rxn to immunization in past.  Current Outpatient Prescriptions on File Prior to Visit  Medication Sig Dispense Refill  . aspirin (ASPIRIN LOW DOSE) 81 MG tablet Take 81 mg by mouth daily.        . beta carotene w/minerals (OCUVITE) tablet Take 1 tablet by mouth daily.      . cetirizine (ZYRTEC) 10 MG tablet Take 10 mg by mouth daily as needed for allergies.       Marland Kitchen CRANBERRY PO Take 1 tablet by mouth daily.      . dorzolamide-timolol (COSOPT) 22.3-6.8 MG/ML ophthalmic solution Place 1 drop into both eyes 2 (two) times daily. Eye drops      . glipiZIDE (GLUCOTROL) 5 MG tablet TAKE ONE TABLET BY MOUTH TWICE DAILY  60 tablet  2  . isosorbide mononitrate (IMDUR) 30 MG 24 hr tablet Take 0.5 tablets (15 mg total) by mouth daily.  30 tablet  3  . lansoprazole (PREVACID) 15 MG capsule TAKE ONE CAPSULE BY MOUTH EVERY DAY  30 capsule  3  . losartan (COZAAR) 50 MG tablet TAKE ONE TABLET BY MOUTH EVERY DAY  30 tablet  5  . metoprolol succinate (TOPROL-XL)  100 MG 24 hr tablet TAKE ONE TABLET BY MOUTH EVERY MORNING  30 tablet  5  . MICROLET LANCETS MISC Use with bayer contour meter test blood sugar twice daily  100 each  5  . nitroGLYCERIN (NITROSTAT) 0.4 MG SL tablet Place 1 tablet (0.4 mg total) under the tongue every 5 (five) minutes as needed for chest pain.  30 tablet  3  . nystatin (MYCOSTATIN) powder Apply topically 4 (four) times daily.  15 g  0  . traZODone (DESYREL) 50 MG tablet Take 1 tablet (50 mg total) by mouth at bedtime as needed for sleep.  30 tablet  3  . TRUETEST TEST test strip TEST TWICE DAILY  100 each  1  . vitamin B-12 (CYANOCOBALAMIN) 500 MCG tablet Take 500 mcg by mouth daily.      Marland Kitchen ZETIA 10 MG tablet TAKE ONE TABLET BY MOUTH EVERY DAY  30 tablet  6  . [DISCONTINUED] loratadine (CLARITIN) 10 MG tablet Take 10 mg by mouth as needed.        No current facility-administered medications on file prior to visit.    Allergies  Allergen Reactions  . Shellfish Allergy Anaphylaxis  .  Celecoxib     REACTION: [place]  . Cephalexin     REACTION: trash and swelling  . Ciprofloxacin     REACTION: u/k  . Clopidogrel Bisulfate     REACTION: swelling, rash  . Codeine Phosphate     REACTION: unspecified  . Ezetimibe-Simvastatin     REACTION: myalgias  . Hydrocod Polst-Cpm Polst Er     REACTION: u/k  . Nitrofurantoin     REACTION: [place]  . Pregabalin     REACTION: felt bad  . Propoxyphene N-Acetaminophen     REACTION: [place]  . Rofecoxib     unk   . Sulfamethoxazole     REACTION: unspecified  . Sulfonamide Derivatives     REACTION: unspecified    Past Medical History  Diagnosis Date  . GERD (gastroesophageal reflux disease)   . CAD (coronary artery disease)     a. s/p tandem Promus DES to LAD in 2009 by Dr. Olevia Perches;  b.  LHC (03/22/14):  prox LAD 30%, mid LAD 40%, LAD stent ok with dist 20% ISR, apical LAD occluded with L-L collats filling apical vessel (too small for PCI), mid RCA 20%, EF 70%.  Med Rx  . Urticaria   .  Hyperlipidemia     intol of statins  . Hypertension   . Allergy     Cipro, Plavix, Statins  . Personal history of colonic polyps   . Diabetes mellitus     Diagnosed 2012  . NHL (non-Hodgkin's lymphoma) dx'd 2003    chemo/xrt comp 2003  . Proctitis   . Diverticulosis of colon (without mention of hemorrhage)   . Hx of echocardiogram     a. Echo (07/2013):  Mild LVH, vigorous LVF, EF 65-70%, Gr 1 DD, mild MR, mild to mod LAE  . Hx of cardiovascular stress test     a. Nuclear (08/2013):  No ischemia, EF 80%, Normal    Past Surgical History  Procedure Laterality Date  . Vaginal hysterectomy      partial, fibroids, one ovary left  . Knee surgery  10/2003    left  . Coronary angioplasty with stent placement      x 2  . Dexa-neg    . Cardiac cath-neg    . Laser surgery for cataracts-left    . Stress cardiolite      Family History  Problem Relation Age of Onset  . Cancer Mother     jaw  . Hypertension Father   . Heart attack Father   . Colon cancer Neg Hx   . Heart attack Sister     History   Social History  . Marital Status: Married    Spouse Name: N/A    Number of Children: N/A  . Years of Education: N/A   Occupational History  . Retired    Social History Main Topics  . Smoking status: Never Smoker   . Smokeless tobacco: Never Used  . Alcohol Use: No  . Drug Use: No  . Sexual Activity: Not on file   Other Topics Concern  . Not on file   Social History Narrative  . No narrative on file   The PMH, PSH, Social History, Family History, Medications, and allergies have been reviewed in [facility], and have been updated if relevant.   Review of Systems See HPI No CP No dysphagia No SOB No HA    Objective:    BP 140/88  Pulse 87  Temp(Src) 98.2 F (36.8 C) (Oral)  Wt 177  lb 4 oz (80.4 kg)  SpO2 94%   Physical Exam  Nursing note and vitals reviewed. Constitutional: She is oriented to person, place, and time. She appears well-developed and  well-nourished. No distress.  HENT:  Head: Normocephalic.  Musculoskeletal: Normal range of motion.  Neurological: She is alert and oriented to person, place, and time. No cranial nerve deficit.  Skin:  Large- softball size red, raised area on right upper arm. Warm to touch. NTTP No streaking up her arm  Psychiatric: She has a normal mood and affect. Her behavior is normal. Judgment and thought content normal.          Assessment & Plan:   No diagnosis found. No Follow-up on file.

## 2014-09-02 NOTE — Patient Instructions (Signed)
Great to see you. Take prednisone as directed. You could try zyrtec in the morning and benadryl at night.  Call me in the morning.

## 2014-09-03 ENCOUNTER — Telehealth: Payer: Self-pay | Admitting: *Deleted

## 2014-09-03 NOTE — Telephone Encounter (Signed)
Pt was seen in the office on 09/02/14 for a reaction to Prevnar 13.Pt states that Dr Deborra Medina wanted her to contact the office with an update on today. She states that she began taking the prednisone on yesterday and her arm has began to improve. She was able to raise her arm this am and put deodorant on, which she was unable to do days prior. She indicates that it is still lightly red at the site, but is not as "warm to the touch" as it was on yesterday. Pt states she has upcoming appt 10/8 to f/u.

## 2014-09-03 NOTE — Telephone Encounter (Signed)
Spoke to pt and advised her to contact office should her s/s worsen before Thursday appt

## 2014-09-03 NOTE — Telephone Encounter (Signed)
Thank you for the update.  Call us if it worsens before her scheduled appt.

## 2014-09-05 ENCOUNTER — Encounter: Payer: Self-pay | Admitting: Family Medicine

## 2014-09-05 ENCOUNTER — Ambulatory Visit (INDEPENDENT_AMBULATORY_CARE_PROVIDER_SITE_OTHER): Payer: Medicare PPO | Admitting: Family Medicine

## 2014-09-05 VITALS — BP 118/66 | HR 68 | Temp 97.8°F | Wt 176.8 lb

## 2014-09-05 DIAGNOSIS — E1121 Type 2 diabetes mellitus with diabetic nephropathy: Secondary | ICD-10-CM

## 2014-09-05 DIAGNOSIS — E782 Mixed hyperlipidemia: Secondary | ICD-10-CM

## 2014-09-05 DIAGNOSIS — T7840XD Allergy, unspecified, subsequent encounter: Secondary | ICD-10-CM

## 2014-09-05 DIAGNOSIS — E1129 Type 2 diabetes mellitus with other diabetic kidney complication: Secondary | ICD-10-CM

## 2014-09-05 LAB — MICROALBUMIN / CREATININE URINE RATIO
Creatinine,U: 338.6 mg/dL
Microalb Creat Ratio: 2.3 mg/g (ref 0.0–30.0)
Microalb, Ur: 7.8 mg/dL — ABNORMAL HIGH (ref 0.0–1.9)

## 2014-09-05 LAB — COMPREHENSIVE METABOLIC PANEL
ALBUMIN: 3.5 g/dL (ref 3.5–5.2)
ALT: 55 U/L — AB (ref 0–35)
AST: 51 U/L — ABNORMAL HIGH (ref 0–37)
Alkaline Phosphatase: 57 U/L (ref 39–117)
BUN: 24 mg/dL — ABNORMAL HIGH (ref 6–23)
CALCIUM: 9.1 mg/dL (ref 8.4–10.5)
CHLORIDE: 103 meq/L (ref 96–112)
CO2: 30 meq/L (ref 19–32)
Creatinine, Ser: 1.1 mg/dL (ref 0.4–1.2)
GFR: 48.67 mL/min — AB (ref >60.00)
GLUCOSE: 129 mg/dL — AB (ref 70–99)
POTASSIUM: 3.9 meq/L (ref 3.5–5.1)
SODIUM: 139 meq/L (ref 135–145)
TOTAL PROTEIN: 7.6 g/dL (ref 6.0–8.3)
Total Bilirubin: 0.8 mg/dL (ref 0.2–1.2)

## 2014-09-05 LAB — HEMOGLOBIN A1C: Hgb A1c MFr Bld: 6.8 % — ABNORMAL HIGH (ref 4.6–6.5)

## 2014-09-05 NOTE — Patient Instructions (Signed)
Great to see you. Your arm looks so much better. Please let me know if it does not continue to improve. We will call you with your lab results from today.

## 2014-09-05 NOTE — Assessment & Plan Note (Signed)
At goal for diabetic. Continue zetia- statin allergic. Lab Results  Component Value Date   CHOL 177 03/18/2014   HDL 44.60 03/18/2014   LDLCALC 103* 03/18/2014   LDLDIRECT 97.1 06/04/2014   TRIG 146.0 03/18/2014   CHOLHDL 4 03/18/2014

## 2014-09-05 NOTE — Progress Notes (Signed)
Subjective:    Patient ID: [name] [name], female    DOB: 11-13-1932, 80 y.o.   MRN: [id number]  HPI  80 yo pleasant female here for follow up.  Diabetes- Lab Results  Component Value Date   HGBA1C 7.1* 06/04/2014    On Glipizide 5 mg daily. Checks FSBS twice daily, doing very well with her diabetic diet. CBGs ranging 70s- low 103. Denies any episodes of hypoglycemia. Neg urine micro 05/2013.  HLD- at goal for diabetic.  Takes Zetia.  Statin intolerant. Lab Results  Component Value Date   CHOL 177 03/18/2014   HDL 44.60 03/18/2014   LDLCALC 103* 03/18/2014   LDLDIRECT 97.1 06/04/2014   TRIG 146.0 03/18/2014   CHOLHDL 4 03/18/2014   Saw her two days ago from right arm swelling, redness and warmth after receiving prevnar 13 immunization. Placed on course of prednisone- feels much better.  Itching has resolved. Last dose of prednisone with be this Sunday. FSBS have been only mildly higher in the evenings since starting prednisone.  Patient Active Problem List   Diagnosis Date Noted  . Allergic reaction 09/02/2014  . Arthralgia of right foot 07/18/2014  . Pruritic intertrigo 07/18/2014  . Right hip pain 06/04/2014  . Other malaise and fatigue 06/04/2014  . Elevated LFTs 04/19/2014  . Mitral regurgitation 04/19/2014  . Burning sensation of feet 06/13/2013  . Pes planus 06/13/2013  . Left hip pain 10/02/2012  . Abdominal pain, chronic, epigastric 03/24/2012  . Renal insufficiency 06/07/2011  . Diabetes mellitus with renal manifestations, controlled 05/31/2011  . MIXED HYPERLIPIDEMIA 08/04/2010  . BEN HTN HEART DISEASE WITHOUT HEART FAIL 08/04/2010  . LUMBAR SPRAIN AND STRAIN 01/15/2010  . VERTIGO 10/29/2009  . OSTEOPENIA 04/29/2009  . 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 02/25/2009  . EXTERNAL HEMORRHOIDS 02/24/2009  . DIVERTICULOSIS, COLON 02/24/2009  . HEMOPTYSIS 09/02/2008  . BACK PAIN, THORACIC REGION 04/30/2008  . LYMPHOMA, MALT 08/28/2007  . DETACHED  RETINA 08/28/2007  . LABILE HYPERTENSION 08/28/2007  . CORONARY ARTERY DISEASE s/p DES x 2 to LAD in 2009 08/28/2007  . MITRAL VALVE PROLAPSE 08/28/2007  . GERD 08/28/2007  . IBS 08/28/2007  . BREAST CYSTS, BILATERAL 08/28/2007  . LOW BACK PAIN, CHRONIC 06/29/2007   Past Medical History  Diagnosis Date  . GERD (gastroesophageal reflux disease)   . CAD (coronary artery disease)     a. s/p tandem Promus DES to LAD in 2009 by Dr. Olevia Perches;  b.  LHC (03/22/14):  prox LAD 30%, mid LAD 40%, LAD stent ok with dist 20% ISR, apical LAD occluded with L-L collats filling apical vessel (too small for PCI), mid RCA 20%, EF 70%.  Med Rx  . Urticaria   . Hyperlipidemia     intol of statins  . Hypertension   . Allergy     Cipro, Plavix, Statins  . Personal history of colonic polyps   . Diabetes mellitus     Diagnosed 2012  . NHL (non-Hodgkin's lymphoma) dx'd 2003    chemo/xrt comp 2003  . Proctitis   . Diverticulosis of colon (without mention of hemorrhage)   . Hx of echocardiogram     a. Echo (07/2013):  Mild LVH, vigorous LVF, EF 65-70%, Gr 1 DD, mild MR, mild to mod LAE  . Hx of cardiovascular stress test     a. Nuclear (08/2013):  No ischemia, EF 80%, Normal   Past Surgical History  Procedure Laterality Date  . Vaginal hysterectomy  partial, fibroids, one ovary left  . Knee surgery  10/2003    left  . Coronary angioplasty with stent placement      x 2  . Dexa-neg    . Cardiac cath-neg    . Laser surgery for cataracts-left    . Stress cardiolite     History  Substance Use Topics  . Smoking status: Never Smoker   . Smokeless tobacco: Never Used  . Alcohol Use: No   Family History  Problem Relation Age of Onset  . Cancer Mother     jaw  . Hypertension Father   . Heart attack Father   . Colon cancer Neg Hx   . Heart attack Sister    Allergies  Allergen Reactions  . Shellfish Allergy Anaphylaxis  . Celecoxib     REACTION: [place]  . Cephalexin     REACTION: trash and  swelling  . Ciprofloxacin     REACTION: u/k  . Clopidogrel Bisulfate     REACTION: swelling, rash  . Codeine Phosphate     REACTION: unspecified  . Ezetimibe-Simvastatin     REACTION: myalgias  . Hydrocod Polst-Cpm Polst Er     REACTION: u/k  . Nitrofurantoin     REACTION: [place]  . Pregabalin     REACTION: felt bad  . Propoxyphene N-Acetaminophen     REACTION: [place]  . Rofecoxib     unk   . Sulfamethoxazole     REACTION: unspecified  . Sulfonamide Derivatives     REACTION: unspecified   Current Outpatient Prescriptions on File Prior to Visit  Medication Sig Dispense Refill  . aspirin (ASPIRIN LOW DOSE) 81 MG tablet Take 81 mg by mouth daily.        . beta carotene w/minerals (OCUVITE) tablet Take 1 tablet by mouth daily.      . cetirizine (ZYRTEC) 10 MG tablet Take 10 mg by mouth daily as needed for allergies.       Marland Kitchen CRANBERRY PO Take 1 tablet by mouth daily.      . dorzolamide-timolol (COSOPT) 22.3-6.8 MG/ML ophthalmic solution Place 1 drop into both eyes 2 (two) times daily. Eye drops      . glipiZIDE (GLUCOTROL) 5 MG tablet TAKE ONE TABLET BY MOUTH TWICE DAILY  60 tablet  2  . isosorbide mononitrate (IMDUR) 30 MG 24 hr tablet Take 0.5 tablets (15 mg total) by mouth daily.  30 tablet  3  . lansoprazole (PREVACID) 15 MG capsule TAKE ONE CAPSULE BY MOUTH EVERY DAY  30 capsule  3  . losartan (COZAAR) 50 MG tablet TAKE ONE TABLET BY MOUTH EVERY DAY  30 tablet  5  . metoprolol succinate (TOPROL-XL) 100 MG 24 hr tablet TAKE ONE TABLET BY MOUTH EVERY MORNING  30 tablet  5  . MICROLET LANCETS MISC Use with bayer contour meter test blood sugar twice daily  100 each  5  . nitroGLYCERIN (NITROSTAT) 0.4 MG SL tablet Place 1 tablet (0.4 mg total) under the tongue every 5 (five) minutes as needed for chest pain.  30 tablet  3  . nystatin (MYCOSTATIN) powder Apply topically 4 (four) times daily.  15 g  0  . predniSONE (DELTASONE) 20 MG tablet 2 tablets by mouth every morning with food for 7  days  14 tablet  0  . traZODone (DESYREL) 50 MG tablet Take 1 tablet (50 mg total) by mouth at bedtime as needed for sleep.  30 tablet  3  . TRUETEST TEST test  strip TEST TWICE DAILY  100 each  1  . vitamin B-12 (CYANOCOBALAMIN) 500 MCG tablet Take 500 mcg by mouth daily.      Marland Kitchen ZETIA 10 MG tablet TAKE ONE TABLET BY MOUTH EVERY DAY  30 tablet  6  . [DISCONTINUED] loratadine (CLARITIN) 10 MG tablet Take 10 mg by mouth as needed.        No current facility-administered medications on file prior to visit.   The PMH, PSH, Social History, Family History, Medications, and allergies have been reviewed in [facility], and have been updated if relevant.    Review of Systems See HPI   No CP No SOB No nausea or vomiting No fevers No anxiety No depression No LE edema  Objective:   Physical Exam BP 118/66  Pulse 68  Temp(Src) 97.8 F (36.6 C) (Oral)  Wt 176 lb 12 oz (80.173 kg)  SpO2 97%  General:  Well-developed,well-nourished,in no acute distress; alert,appropriate and cooperative throughout examination Head:  normocephalic and atraumatic.   Eyes:  vision grossly intact, pupils equal, pupils round, and pupils reactive to light.   Ears:  R ear normal and L ear normal.   Nose:  no external deformity.   Mouth:  good dentition.   Lungs:  Normal respiratory effort, chest expands symmetrically. Lungs are clear to auscultation, no crackles or wheezes. Heart:  Normal rate and regular rhythm. S1 and S2 normal without gallop, murmur, click, rub or other extra sounds. Abdomen:  Bowel sounds positive,abdomen soft and non-tender without masses, organomegaly or hernias noted. Msk:  No deformity or scoliosis noted of thoracic or lumbar spine.   Extremities:   Right upper arm- area of erythema much small, no longer warm or edematous, able to lift arm overhead without discomfort. Neurologic:  alert & oriented X3 and gait normal.   Psych:  Cognition and judgment appear intact. Alert and cooperative with  normal attention span and concentration. No apparent delusions, illusions, hallucinations    Assessment & Plan:

## 2014-09-05 NOTE — Assessment & Plan Note (Signed)
Improving. Finish course of prednisone. Call or return to clinic prn if these symptoms worsen or fail to improve as anticipated. The patient indicates understanding of these issues and agrees with the plan.

## 2014-09-05 NOTE — Progress Notes (Signed)
Pre visit review using our clinic review tool, if applicable. No additional management support is needed unless otherwise documented below in the visit note. 

## 2014-09-05 NOTE — Assessment & Plan Note (Signed)
Has been well controlled. Due for urine micro. Recheck a1c today as well.

## 2014-09-09 ENCOUNTER — Encounter: Payer: Self-pay | Admitting: *Deleted

## 2014-09-12 ENCOUNTER — Telehealth: Payer: Self-pay

## 2014-09-12 DIAGNOSIS — R5383 Other fatigue: Secondary | ICD-10-CM

## 2014-09-12 NOTE — Telephone Encounter (Signed)
Pt left v/m; pt received flushot and prevnar 13 on 08/26/14; pt continues feeling weak, no energy and tired. Pt wants to know could these symptoms be related to Prevnar vaccine. Pt request cb.

## 2014-09-12 NOTE — Telephone Encounter (Signed)
Spoke to pt and advised per Dr Deborra Medina. Repeat labs ordered and sched for 10/16

## 2014-09-12 NOTE — Telephone Encounter (Signed)
I suppose that she could still be having some lingering effects.  I would like for her to come in for CBC, TSH, vit B12 and Vit D labs- Dx- R53.83

## 2014-09-13 ENCOUNTER — Other Ambulatory Visit (INDEPENDENT_AMBULATORY_CARE_PROVIDER_SITE_OTHER): Payer: Medicare PPO

## 2014-09-13 DIAGNOSIS — E559 Vitamin D deficiency, unspecified: Secondary | ICD-10-CM

## 2014-09-13 DIAGNOSIS — M949 Disorder of cartilage, unspecified: Secondary | ICD-10-CM

## 2014-09-13 DIAGNOSIS — R7989 Other specified abnormal findings of blood chemistry: Secondary | ICD-10-CM

## 2014-09-13 DIAGNOSIS — E1129 Type 2 diabetes mellitus with other diabetic kidney complication: Secondary | ICD-10-CM

## 2014-09-13 DIAGNOSIS — R945 Abnormal results of liver function studies: Secondary | ICD-10-CM

## 2014-09-13 DIAGNOSIS — E1121 Type 2 diabetes mellitus with diabetic nephropathy: Secondary | ICD-10-CM

## 2014-09-13 DIAGNOSIS — M899 Disorder of bone, unspecified: Secondary | ICD-10-CM

## 2014-09-13 DIAGNOSIS — Z79899 Other long term (current) drug therapy: Secondary | ICD-10-CM

## 2014-09-13 DIAGNOSIS — R5383 Other fatigue: Secondary | ICD-10-CM

## 2014-09-13 LAB — VITAMIN D 25 HYDROXY (VIT D DEFICIENCY, FRACTURES): VITD: 26.02 ng/mL — ABNORMAL LOW (ref 30.00–100.00)

## 2014-09-13 LAB — CBC WITH DIFFERENTIAL/PLATELET
BASOS ABS: 0 10*3/uL (ref 0.0–0.1)
BASOS PCT: 0.4 % (ref 0.0–3.0)
Eosinophils Absolute: 0.1 10*3/uL (ref 0.0–0.7)
Eosinophils Relative: 1 % (ref 0.0–5.0)
HCT: 43.2 % (ref 36.0–46.0)
Hemoglobin: 14.1 g/dL (ref 12.0–15.0)
Lymphocytes Relative: 34.5 % (ref 12.0–46.0)
Lymphs Abs: 2.6 10*3/uL (ref 0.7–4.0)
MCHC: 32.6 g/dL (ref 30.0–36.0)
MCV: 98.2 fl (ref 78.0–100.0)
MONO ABS: 0.6 10*3/uL (ref 0.1–1.0)
Monocytes Relative: 7.7 % (ref 3.0–12.0)
NEUTROS PCT: 56.4 % (ref 43.0–77.0)
Neutro Abs: 4.3 10*3/uL (ref 1.4–7.7)
PLATELETS: 252 10*3/uL (ref 150.0–400.0)
RBC: 4.4 Mil/uL (ref 3.87–5.11)
RDW: 13.5 % (ref 11.5–15.5)
WBC: 7.7 10*3/uL (ref 4.0–10.5)

## 2014-09-13 LAB — TSH: TSH: 1.19 u[IU]/mL (ref 0.35–4.50)

## 2014-09-13 LAB — VITAMIN B12: Vitamin B-12: 657 pg/mL (ref 211–911)

## 2014-09-19 MED ORDER — VITAMIN D (ERGOCALCIFEROL) 1.25 MG (50000 UNIT) PO CAPS: 50000.0000 [IU] | ORAL_CAPSULE | ORAL | Status: DC

## 2014-09-19 NOTE — Addendum Note (Signed)
Addended by: Modena Nunnery on: 09/19/2014 03:19 PM   Modules accepted: Orders

## 2014-10-21 ENCOUNTER — Other Ambulatory Visit: Payer: Self-pay | Admitting: Family Medicine

## 2014-11-07 ENCOUNTER — Encounter (HOSPITAL_COMMUNITY): Payer: Self-pay | Admitting: Cardiovascular Disease

## 2014-11-08 ENCOUNTER — Ambulatory Visit (INDEPENDENT_AMBULATORY_CARE_PROVIDER_SITE_OTHER): Payer: Medicare PPO

## 2014-11-08 ENCOUNTER — Other Ambulatory Visit: Payer: Self-pay | Admitting: Family Medicine

## 2014-11-08 ENCOUNTER — Ambulatory Visit (INDEPENDENT_AMBULATORY_CARE_PROVIDER_SITE_OTHER): Payer: Medicare PPO | Admitting: Podiatry

## 2014-11-08 ENCOUNTER — Encounter: Payer: Self-pay | Admitting: Podiatry

## 2014-11-08 VITALS — BP 125/68 | HR 68 | Resp 16 | Ht 69.0 in | Wt 176.0 lb

## 2014-11-08 DIAGNOSIS — M779 Enthesopathy, unspecified: Secondary | ICD-10-CM

## 2014-11-08 DIAGNOSIS — R609 Edema, unspecified: Secondary | ICD-10-CM

## 2014-11-08 MED ORDER — TRIAMCINOLONE ACETONIDE 10 MG/ML IJ SUSP
10.0000 mg | Freq: Once | INTRAMUSCULAR | Status: AC
  Administered 2014-11-08: 10 mg

## 2014-11-08 NOTE — Progress Notes (Signed)
   Subjective:    Patient ID: [name] [name], female    DOB: 1932/11/03, 80 y.o.   MRN: [id number]  HPI Comments: The right foot is swollen and painful. It has been like this for 3 or 4 months off an on. Went to primary doctor does not know , diabetes , dont know what the last a1c was   Foot Pain      Review of Systems  Endocrine:       Diabetes   All other systems reviewed and are negative.      Objective:   Physical Exam        Assessment & Plan:

## 2014-11-09 NOTE — Progress Notes (Signed)
Subjective:     Patient ID: [name] [name], female   DOB: [birth year], 80 y.o.   MRN: [id number]  HPI patient presents with quite a bit of discomfort in the right ankle and right forefoot for several months and does not remember any specific injury or trauma   Review of Systems  All other systems reviewed and are negative.      Objective:   Physical Exam  Nursing note and vitals reviewed.  neurovascular status found to be intact muscle strength was adequate with discomfort in the sinus tarsi right and when the ankle was inverted or everted. Also noted to have forefoot edema with mild to moderate discomfort around the second and third metatarsophalangeal joint and pain with palpation to the metatarsal shafts of a mild nature     Assessment:     Probable sinus tarsitis right with forefoot edema which may be related to inflammatory capsulitis gait change or possible stress fracture    Plan:     H&P and x-rays reviewed with patient. Today I went ahead and injected the sinus tarsi right 3 mg Kenalog 5 mg Xylocaine and applied Unna boot to try to reduce forefoot edema and take stress off her foot. Patient will reappoint to recheck again in the next several weeks or earlier if symptoms indicate and she will leave the dressing on for the next 4 days and take it off if she should develop any discoloration in her digits

## 2014-11-18 ENCOUNTER — Other Ambulatory Visit: Payer: Self-pay | Admitting: Cardiovascular Disease

## 2014-11-19 ENCOUNTER — Ambulatory Visit (INDEPENDENT_AMBULATORY_CARE_PROVIDER_SITE_OTHER): Payer: Medicare PPO | Admitting: Podiatry

## 2014-11-19 VITALS — BP 120/66 | HR 66 | Resp 16

## 2014-11-19 DIAGNOSIS — M779 Enthesopathy, unspecified: Secondary | ICD-10-CM

## 2014-11-19 DIAGNOSIS — R609 Edema, unspecified: Secondary | ICD-10-CM

## 2014-11-20 NOTE — Progress Notes (Signed)
Subjective:     Patient ID: [name] [name], female   DOB: February 18, 1932, 80 y.o.   MRN: [id number]  HPI patient presents stating that she has had quite a bit of reduction of discomfort in her ankle but that she's having a lot of pain in her metatarsal phalangeal joints still and that the swelling has reduced quite a bit with the treatment   Review of Systems     Objective:   Physical Exam Neurovascular status unchanged with muscle strength and range of motion within normal limits. Patient is noted to have mild forefoot edema but improved from previous visit and quite a bit of discomfort in the second metatarsophalangeal joint right upon palpation. Right sinus tarsi is improved with palpation and ankle motion is improved    Assessment:     Continued inflammatory capsulitis right with improvement of edema and sinus tarsitis right    Plan:     Reviewed condition and discussed possibility long-term for capsular injection treatment but today were to try to utilize pad therapy and rigid bottom shoes and see whether or not this will reduce. She will be seen back for reevaluation again if symptoms persist

## 2014-11-25 ENCOUNTER — Ambulatory Visit (INDEPENDENT_AMBULATORY_CARE_PROVIDER_SITE_OTHER): Payer: Medicare PPO | Admitting: Family Medicine

## 2014-11-25 ENCOUNTER — Encounter: Payer: Self-pay | Admitting: Family Medicine

## 2014-11-25 VITALS — BP 126/74 | HR 64 | Temp 98.1°F | Ht 69.0 in | Wt 174.2 lb

## 2014-11-25 DIAGNOSIS — J209 Acute bronchitis, unspecified: Secondary | ICD-10-CM

## 2014-11-25 MED ORDER — BENZONATATE 200 MG PO CAPS: 200.0000 mg | ORAL_CAPSULE | Freq: Three times a day (TID) | ORAL | Status: DC | PRN

## 2014-11-25 MED ORDER — AMOXICILLIN 500 MG PO CAPS: 500.0000 mg | ORAL_CAPSULE | Freq: Three times a day (TID) | ORAL | Status: DC

## 2014-11-25 NOTE — Progress Notes (Signed)
Subjective:    Patient ID: [name] [name], female    DOB: 11-30-1931, 80 y.o.   MRN: [id number]  HPI Here for uri symptoms   Started getting sick on 12/19  Nose runny/stuffy and face pressure  She took alka selzer cold medicine   Cough is productive and persistent - yellow mucous  ST from cough as well   Nl temp/ no fever   Facial pressure is improved but cough is worse   Patient Active Problem List   Diagnosis Date Noted  . Allergic reaction 09/02/2014  . Arthralgia of right foot 07/18/2014  . Pruritic intertrigo 07/18/2014  . Right hip pain 06/04/2014  . Other malaise and fatigue 06/04/2014  . Elevated LFTs 04/19/2014  . Mitral regurgitation 04/19/2014  . Burning sensation of feet 06/13/2013  . Pes planus 06/13/2013  . Left hip pain 10/02/2012  . Abdominal pain, chronic, epigastric 03/24/2012  . Renal insufficiency 06/07/2011  . Diabetes mellitus with renal manifestations, controlled 05/31/2011  . MIXED HYPERLIPIDEMIA 08/04/2010  . BEN HTN HEART DISEASE WITHOUT HEART FAIL 08/04/2010  . LUMBAR SPRAIN AND STRAIN 01/15/2010  . VERTIGO 10/29/2009  . OSTEOPENIA 04/29/2009  . 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 02/25/2009  . EXTERNAL HEMORRHOIDS 02/24/2009  . DIVERTICULOSIS, COLON 02/24/2009  . HEMOPTYSIS 09/02/2008  . BACK PAIN, THORACIC REGION 04/30/2008  . LYMPHOMA, MALT 08/28/2007  . DETACHED RETINA 08/28/2007  . LABILE HYPERTENSION 08/28/2007  . CORONARY ARTERY DISEASE s/p DES x 2 to LAD in 2009 08/28/2007  . MITRAL VALVE PROLAPSE 08/28/2007  . GERD 08/28/2007  . IBS 08/28/2007  . BREAST CYSTS, BILATERAL 08/28/2007  . LOW BACK PAIN, CHRONIC 06/29/2007   Past Medical History  Diagnosis Date  . GERD (gastroesophageal reflux disease)   . CAD (coronary artery disease)     a. s/p tandem Promus DES to LAD in 2009 by Dr. Olevia Perches;  b.  LHC (03/22/14):  prox LAD 30%, mid LAD 40%, LAD stent ok with dist 20% ISR, apical LAD occluded with L-L collats filling  apical vessel (too small for PCI), mid RCA 20%, EF 70%.  Med Rx  . Urticaria   . Hyperlipidemia     intol of statins  . Hypertension   . Allergy     Cipro, Plavix, Statins  . Personal history of colonic polyps   . Diabetes mellitus     Diagnosed 2012  . NHL (non-Hodgkin's lymphoma) dx'd 2003    chemo/xrt comp 2003  . Proctitis   . Diverticulosis of colon (without mention of hemorrhage)   . Hx of echocardiogram     a. Echo (07/2013):  Mild LVH, vigorous LVF, EF 65-70%, Gr 1 DD, mild MR, mild to mod LAE  . Hx of cardiovascular stress test     a. Nuclear (08/2013):  No ischemia, EF 80%, Normal   Past Surgical History  Procedure Laterality Date  . Vaginal hysterectomy      partial, fibroids, one ovary left  . Knee surgery  10/2003    left  . Coronary angioplasty with stent placement      x 2  . Dexa-neg    . Cardiac cath-neg    . Laser surgery for cataracts-left    . Stress cardiolite    . Left heart catheterization with coronary angiogram N/A 03/22/2014    Procedure: LEFT HEART CATHETERIZATION WITH CORONARY ANGIOGRAM;  Surgeon: Burnell Blanks, MD;  Location: [facility] CATH LAB;  Service: Cardiovascular;  Laterality: N/A;   History  Substance  Use Topics  . Smoking status: Never Smoker   . Smokeless tobacco: Never Used  . Alcohol Use: No   Family History  Problem Relation Age of Onset  . Cancer Mother     jaw  . Hypertension Father   . Heart attack Father   . Colon cancer Neg Hx   . Heart attack Sister    Allergies  Allergen Reactions  . Shellfish Allergy Anaphylaxis  . Celecoxib     REACTION: [place]  . Cephalexin     REACTION: trash and swelling  . Ciprofloxacin     REACTION: u/k  . Clopidogrel Bisulfate     REACTION: swelling, rash  . Codeine Phosphate     REACTION: unspecified  . Ezetimibe-Simvastatin     REACTION: myalgias  . Hydrocod Polst-Cpm Polst Er     REACTION: u/k  . Nitrofurantoin     REACTION: [place]  . Pregabalin     REACTION: felt bad  .  Propoxyphene N-Acetaminophen     REACTION: [place]  . Rofecoxib     unk   . Sulfamethoxazole     REACTION: unspecified  . Sulfonamide Derivatives     REACTION: unspecified   Current Outpatient Prescriptions on File Prior to Visit  Medication Sig Dispense Refill  . aspirin (ASPIRIN LOW DOSE) 81 MG tablet Take 81 mg by mouth daily.      . beta carotene w/minerals (OCUVITE) tablet Take 1 tablet by mouth daily.    . cetirizine (ZYRTEC) 10 MG tablet Take 10 mg by mouth daily as needed for allergies.     Marland Kitchen CRANBERRY PO Take 1 tablet by mouth daily.    . dorzolamide-timolol (COSOPT) 22.3-6.8 MG/ML ophthalmic solution Place 1 drop into both eyes 2 (two) times daily. Eye drops    . glipiZIDE (GLUCOTROL) 5 MG tablet TAKE ONE TABLET BY MOUTH TWICE DAILY 60 tablet 2  . isosorbide mononitrate (IMDUR) 30 MG 24 hr tablet Take 0.5 tablets (15 mg total) by mouth daily. 30 tablet 3  . lansoprazole (PREVACID) 15 MG capsule TAKE ONE CAPSULE BY MOUTH EVERY DAY 30 capsule 5  . losartan (COZAAR) 50 MG tablet TAKE ONE TABLET BY MOUTH EVERY DAY 30 tablet 3  . metoprolol succinate (TOPROL-XL) 100 MG 24 hr tablet TAKE ONE TABLET BY MOUTH EVERY MORNING 30 tablet 5  . MICROLET LANCETS MISC Use with bayer contour meter test blood sugar twice daily 100 each 5  . nitroGLYCERIN (NITROSTAT) 0.4 MG SL tablet Place 1 tablet (0.4 mg total) under the tongue every 5 (five) minutes as needed for chest pain. 30 tablet 3  . TRUETEST TEST test strip TEST TWICE DAILY 100 each 1  . vitamin B-12 (CYANOCOBALAMIN) 500 MCG tablet Take 500 mcg by mouth daily.    Marland Kitchen ZETIA 10 MG tablet TAKE ONE TABLET BY MOUTH EVERY DAY 30 tablet 6  . [DISCONTINUED] loratadine (CLARITIN) 10 MG tablet Take 10 mg by mouth as needed.      No current facility-administered medications on file prior to visit.     Review of Systems Review of Systems  Constitutional: Negative for fever, appetite change,  and unexpected weight change.  Eyes: Negative for pain and  visual disturbance.  ENT pos for nasal congestion /neg for facial pain  Respiratory: Negative for wheeze and shortness of breath.   Cardiovascular: Negative for cp or palpitations    Gastrointestinal: Negative for nausea, diarrhea and constipation.  Genitourinary: Negative for urgency and frequency.  Skin: Negative for pallor or  rash   Neurological: Negative for weakness, light-headedness, numbness and headaches.  Hematological: Negative for adenopathy. Does not bruise/bleed easily.  Psychiatric/Behavioral: Negative for dysphoric mood. The patient is not nervous/anxious.         Objective:   Physical Exam  Constitutional: She appears well-developed and well-nourished. No distress.  HENT:  Head: Normocephalic and atraumatic.  Right Ear: External ear normal.  Left Ear: External ear normal.  Mouth/Throat: Oropharynx is clear and moist. No oropharyngeal exudate.  Nares are injected and congested  No sinus tenderness  Eyes: Conjunctivae and EOM are normal. Pupils are equal, round, and reactive to light. Right eye exhibits no discharge. Left eye exhibits no discharge.  Neck: Normal range of motion. Neck supple.  Cardiovascular: Normal rate and regular rhythm.   Pulmonary/Chest: Effort normal and breath sounds normal. No respiratory distress. She has no wheezes. She has no rales.  Harsh bs  Croupy cough   Lymphadenopathy:    She has no cervical adenopathy.  Neurological: She is alert.  Skin: Skin is warm and dry. No rash noted.  Psychiatric: She has a normal mood and affect.          Assessment & Plan:   Problem List Items Addressed This Visit      Respiratory   Acute bronchitis - Primary    Given duration / cover with abx (pt states she does well with amox even though she cannot take cephalosporin)- requested amox Disc symptomatic care - see instructions on AVS  Tessalon for cough   Update if not starting to improve in a week or if worsening  -esp if any wheezing

## 2014-11-25 NOTE — Assessment & Plan Note (Signed)
Given duration / cover with abx (pt states she does well with amox even though she cannot take cephalosporin)- requested amox Disc symptomatic care - see instructions on AVS  Tessalon for cough   Update if not starting to improve in a week or if worsening  -esp if any wheezing

## 2014-11-25 NOTE — Progress Notes (Signed)
Pre visit review using our clinic review tool, if applicable. No additional management support is needed unless otherwise documented below in the visit note. 

## 2014-11-25 NOTE — Patient Instructions (Signed)
You have bronchitis  Drink lots of fluids and rest  Take amoxicillin as directed  Try tessalon for cough  Try mucinex DM over the counter for cough also if you want something extra   Update if not starting to improve in a week or if worsening

## 2014-11-26 ENCOUNTER — Other Ambulatory Visit: Payer: Self-pay | Admitting: Family Medicine

## 2014-11-26 DIAGNOSIS — E559 Vitamin D deficiency, unspecified: Secondary | ICD-10-CM

## 2014-12-02 ENCOUNTER — Other Ambulatory Visit: Payer: Medicare PPO

## 2014-12-02 ENCOUNTER — Other Ambulatory Visit: Payer: Self-pay | Admitting: Family Medicine

## 2014-12-03 ENCOUNTER — Other Ambulatory Visit (INDEPENDENT_AMBULATORY_CARE_PROVIDER_SITE_OTHER): Payer: Medicare PPO

## 2014-12-03 DIAGNOSIS — E559 Vitamin D deficiency, unspecified: Secondary | ICD-10-CM

## 2014-12-03 LAB — VITAMIN D 25 HYDROXY (VIT D DEFICIENCY, FRACTURES): VITD: 25.45 ng/mL — AB (ref 30.00–100.00)

## 2014-12-05 ENCOUNTER — Ambulatory Visit (INDEPENDENT_AMBULATORY_CARE_PROVIDER_SITE_OTHER): Payer: Medicare PPO | Admitting: Cardiovascular Disease

## 2014-12-05 ENCOUNTER — Encounter: Payer: Self-pay | Admitting: Cardiovascular Disease

## 2014-12-05 VITALS — BP 130/78 | HR 61 | Ht 69.0 in | Wt 174.8 lb

## 2014-12-05 DIAGNOSIS — I2510 Atherosclerotic heart disease of native coronary artery without angina pectoris: Secondary | ICD-10-CM

## 2014-12-05 DIAGNOSIS — I340 Nonrheumatic mitral (valve) insufficiency: Secondary | ICD-10-CM

## 2014-12-05 DIAGNOSIS — I10 Essential (primary) hypertension: Secondary | ICD-10-CM

## 2014-12-05 DIAGNOSIS — E785 Hyperlipidemia, unspecified: Secondary | ICD-10-CM

## 2014-12-05 NOTE — Progress Notes (Signed)
History of Present Illness: 81 yo female with history of CAD, HLD, HTN, non-Hodgkin's lymphoma, DM here today for cardiac follow up. She has been followed in the past by Dr. Olevia Perches. In 2009 she had tandem non-overlapping Promus drug-eluting stents placed in the LAD. She was evaluated in 2010 for chest pain and had a negative Myoview scan. She has been intolerant to statins and Plavix in the past. She has been on Zetia on doing well. Stress myoview 09/05/13 without ischemia. Echo 08/23/13 with normal LV size and function, mild MR. I saw her April 2015 and she c/o chest pain. Cardiac cath was arranged. This demonstrated a patent LAD stent. She did have an occluded apical LAD with left to left collaterals. This vessel was too small for PCI. She had nonobstructive disease elsewhere.  She is here for follow up. She is doing well. She has no chest pain or SOB.   Primary Care Physician: Arnette Norris  Last Lipid Profile:Lipid Panel     Component Value Date/Time   CHOL 177 03/18/2014 0914   TRIG 146.0 03/18/2014 0914   HDL 44.60 03/18/2014 0914   CHOLHDL 4 03/18/2014 0914   VLDL 29.2 03/18/2014 0914   LDLCALC 103* 03/18/2014 0914     Past Medical History  Diagnosis Date  . GERD (gastroesophageal reflux disease)   . CAD (coronary artery disease)     a. s/p tandem Promus DES to LAD in 2009 by Dr. Olevia Perches;  b.  LHC (03/22/14):  prox LAD 30%, mid LAD 40%, LAD stent ok with dist 20% ISR, apical LAD occluded with L-L collats filling apical vessel (too small for PCI), mid RCA 20%, EF 70%.  Med Rx  . Urticaria   . Hyperlipidemia     intol of statins  . Hypertension   . Allergy     Cipro, Plavix, Statins  . Personal history of colonic polyps   . Diabetes mellitus     Diagnosed 2012  . NHL (non-Hodgkin's lymphoma) dx'd 2003    chemo/xrt comp 2003  . Proctitis   . Diverticulosis of colon (without mention of hemorrhage)   . Hx of echocardiogram     a. Echo (07/2013):  Mild LVH, vigorous LVF, EF  65-70%, Gr 1 DD, mild MR, mild to mod LAE  . Hx of cardiovascular stress test     a. Nuclear (08/2013):  No ischemia, EF 80%, Normal    Past Surgical History  Procedure Laterality Date  . Vaginal hysterectomy      partial, fibroids, one ovary left  . Knee surgery  10/2003    left  . Coronary angioplasty with stent placement      x 2  . Dexa-neg    . Cardiac cath-neg    . Laser surgery for cataracts-left    . Stress cardiolite    . Left heart catheterization with coronary angiogram N/A 03/22/2014    Procedure: LEFT HEART CATHETERIZATION WITH CORONARY ANGIOGRAM;  Surgeon: Burnell Blanks, MD;  Location: [facility] CATH LAB;  Service: Cardiovascular;  Laterality: N/A;    Current Outpatient Prescriptions  Medication Sig Dispense Refill  . aspirin (ASPIRIN LOW DOSE) 81 MG tablet Take 81 mg by mouth daily.      . beta carotene w/minerals (OCUVITE) tablet Take 1 tablet by mouth daily.    . cetirizine (ZYRTEC) 10 MG tablet Take 10 mg by mouth daily as needed for allergies.     . COMBIGAN 0.2-0.5 % ophthalmic solution     .  CRANBERRY PO Take 1 tablet by mouth daily.    Marland Kitchen glipiZIDE (GLUCOTROL) 5 MG tablet TAKE ONE TABLET BY MOUTH TWICE DAILY 60 tablet 1  . isosorbide mononitrate (IMDUR) 30 MG 24 hr tablet Take 0.5 tablets (15 mg total) by mouth daily. 30 tablet 3  . lansoprazole (PREVACID) 15 MG capsule TAKE ONE CAPSULE BY MOUTH EVERY DAY 30 capsule 5  . losartan (COZAAR) 50 MG tablet TAKE ONE TABLET BY MOUTH EVERY DAY 30 tablet 3  . metoprolol succinate (TOPROL-XL) 100 MG 24 hr tablet TAKE ONE TABLET BY MOUTH EVERY MORNING 30 tablet 5  . MICROLET LANCETS MISC Use with bayer contour meter test blood sugar twice daily 100 each 5  . nitroGLYCERIN (NITROSTAT) 0.4 MG SL tablet Place 1 tablet (0.4 mg total) under the tongue every 5 (five) minutes as needed for chest pain. 30 tablet 3  . predniSONE (DELTASONE) 20 MG tablet     . TRUETEST TEST test strip TEST TWICE DAILY 100 each 1  . vitamin B-12  (CYANOCOBALAMIN) 500 MCG tablet Take 500 mcg by mouth daily.    Marland Kitchen ZETIA 10 MG tablet TAKE ONE TABLET BY MOUTH EVERY DAY 30 tablet 6  . [DISCONTINUED] loratadine (CLARITIN) 10 MG tablet Take 10 mg by mouth as needed.      No current facility-administered medications for this visit.    Allergies  Allergen Reactions  . Shellfish Allergy Anaphylaxis  . Celecoxib     REACTION: [place]  . Cephalexin     REACTION: trash and swelling  . Ciprofloxacin     REACTION: u/k  . Clopidogrel Bisulfate     REACTION: swelling, rash  . Codeine Phosphate     REACTION: unspecified  . Ezetimibe-Simvastatin     REACTION: myalgias  . Hydrocod Polst-Cpm Polst Er     REACTION: u/k  . Nitrofurantoin     REACTION: [place]  . Pregabalin     REACTION: felt bad  . Propoxyphene N-Acetaminophen     REACTION: [place]  . Rofecoxib     unk   . Sulfamethoxazole     REACTION: unspecified  . Sulfonamide Derivatives     REACTION: unspecified    History   Social History  . Marital Status: Married    Spouse Name: N/A    Number of Children: N/A  . Years of Education: N/A   Occupational History  . Retired    Social History Main Topics  . Smoking status: Never Smoker   . Smokeless tobacco: Never Used  . Alcohol Use: No  . Drug Use: No  . Sexual Activity: Not on file   Other Topics Concern  . Not on file   Social History Narrative    Family History  Problem Relation Age of Onset  . Cancer Mother     jaw  . Hypertension Father   . Heart attack Father   . Colon cancer Neg Hx   . Heart attack Sister     Review of Systems:  As stated in the HPI and otherwise negative.   BP 130/78 mmHg  Pulse 61  Ht 5\' 9"  (1.753 m)  Wt 174 lb 12.8 oz (79.289 kg)  BMI 25.80 kg/m2  SpO2 95%  Physical Examination: General: Well developed, well nourished, NAD HEENT: OP clear, mucus membranes moist SKIN: warm, dry. No rashes. Neuro: No focal deficits Musculoskeletal: Muscle strength 5/5 all ext Psychiatric: Mood and  affect normal Neck: No JVD, no carotid bruits, no thyromegaly, no lymphadenopathy. Lungs:Clear bilaterally, no  wheezes, rhonci, crackles Cardiovascular: Loletha Grayer, Regular rhythm. No murmurs, gallops or rubs. Abdomen:Soft. Bowel sounds present. Non-tender.  Extremities: No lower extremity edema. Pulses are 2 + in the bilateral DP/PT.  Exercise stress myoview: 09/05/13:  Stress Procedure: The patient exercised on the treadmill utilizing the Bruce Protocol for 7:06 minutes. The patient stopped due to sob, jaw pain, chest pressure and chest pain. Technetium 43m Sestamibi was injected at peak exercise and myocardial perfusion imaging was performed after a brief delay.  Stress ECG: No significant change from baseline ECG  QPS  Raw Data Images: Normal; no motion artifact; normal heart/lung ratio.  Stress Images: Normal homogeneous uptake in all areas of the myocardium.  Rest Images: Normal homogeneous uptake in all areas of the myocardium.  Subtraction (SDS): No evidence of ischemia.  Transient Ischemic Dilatation (Normal <1.22): N/A  Lung/Heart Ratio (Normal <0.45): 0.34  Quantitative Gated Spect Images  QGS EDV: 53 ml  QGS ESV: 11 ml  Impression  Exercise Capacity: Lexiscan with low level exercise.  BP Response: Normal blood pressure response.  Clinical Symptoms: Significant chest pain.  ECG Impression: No significant ST segment change suggestive of ischemia.  Comparison with Prior Nuclear Study: No significant change from previous study  Overall Impression: Normal stress nuclear study. Patient had chest pain. No EKG changes. Normal perfusion. LV systolic function is vigorous.  LV Ejection Fraction: 80%. LV Wall Motion: NL LV Function; NL Wall Motion  Echo 08/23/13: Left ventricle: The cavity size was normal. There was mild concentric hypertrophy. Systolic function was vigorous. The estimated ejection fraction was in the range of 65% to 70%. Wall motion was normal; there were no regional  wall motion abnormalities. Doppler parameters are consistent with abnormal left ventricular relaxation (grade 1 diastolic dysfunction). - Mitral valve: Mildly calcified annulus. Mild regurgitation. - Left atrium: The atrium was mildly to moderately dilated.  Cardiac cath 03/22/14: Left main: No obstructive disease.  Left Anterior Descending Artery: Large caliber vessel that courses to the apex. 30% proximal stenosis. 40% mid stenosis just prior to the stented segment. Mid stented segment patent without restenosis. Distal stented segment patent with 20% restenosis. Apical LAD becomes very small in caliber and has total occlusion with filling of apical vessel by left to left collaterals. (0.75 mm vessel).  Circumflex Artery: Large caliber vessel with several small OM branches. Diffuse luminal irregularities in the mid vessel.  Right Coronary Artery: Large dominant vessel with 20% mid stenosis.  Left Ventricular Angiogram: LVEF=70%.   EKG: NSR, rate 61 bpm. LVH. Poor R wave progression.   Assessment and Plan:   1. CORONARY ARTERY DISEASE: Stable. Continue Zetia, ASA, Toprol. She is intolerant to statins.   2. Hyperlipidemia: Lipids well controlled April 2014 as above. Will continue Zetia. She is intolerant of statins. Repeat lipids and LFTs now.   3. HTN: BP controlled. Continue Cozaar and Toprol.   4. Mitral regurgitation: Mild by echo September 2014. Repeat echo September 2016.

## 2014-12-05 NOTE — Patient Instructions (Signed)
Your physician wants you to follow-up in:  6 months. You will receive a reminder letter in the mail two months in advance. If you don't receive a letter, please call our office to schedule the follow-up appointment.   

## 2014-12-06 ENCOUNTER — Telehealth: Payer: Self-pay | Admitting: *Deleted

## 2014-12-06 NOTE — Telephone Encounter (Signed)
We could certainly try restarting the 1000 IU daily dose and recheck it in a month or two before considering restarting the high dose rx.

## 2014-12-06 NOTE — Telephone Encounter (Signed)
Spoke to someone at pts home and requested pt cb

## 2014-12-06 NOTE — Telephone Encounter (Signed)
Spoke to pt and advised per Dr Deborra Medina. Pt verbally expressed understanding and f/u lab appt scheduled for 16mo.

## 2014-12-06 NOTE — Telephone Encounter (Signed)
Spoke to pt who received her VitD results. She stopped taking 1000iu months ago, and is questioning if she should start back on it, or if she will be needing the high dose Rx. Pls advise

## 2014-12-17 ENCOUNTER — Other Ambulatory Visit: Payer: Self-pay | Admitting: Physician Assistant

## 2014-12-25 ENCOUNTER — Other Ambulatory Visit: Payer: Self-pay | Admitting: Family Medicine

## 2014-12-25 DIAGNOSIS — E559 Vitamin D deficiency, unspecified: Secondary | ICD-10-CM

## 2014-12-30 ENCOUNTER — Other Ambulatory Visit: Payer: Medicare PPO

## 2014-12-30 ENCOUNTER — Ambulatory Visit (INDEPENDENT_AMBULATORY_CARE_PROVIDER_SITE_OTHER): Payer: Medicare PPO | Admitting: Family Medicine

## 2014-12-30 ENCOUNTER — Other Ambulatory Visit: Payer: Self-pay | Admitting: Cardiovascular Disease

## 2014-12-30 ENCOUNTER — Encounter: Payer: Self-pay | Admitting: Family Medicine

## 2014-12-30 VITALS — BP 132/72 | HR 65 | Temp 97.9°F | Wt 176.8 lb

## 2014-12-30 DIAGNOSIS — E119 Type 2 diabetes mellitus without complications: Secondary | ICD-10-CM | POA: Diagnosis not present

## 2014-12-30 DIAGNOSIS — E782 Mixed hyperlipidemia: Secondary | ICD-10-CM

## 2014-12-30 DIAGNOSIS — K5900 Constipation, unspecified: Secondary | ICD-10-CM

## 2014-12-30 DIAGNOSIS — E559 Vitamin D deficiency, unspecified: Secondary | ICD-10-CM

## 2014-12-30 DIAGNOSIS — E538 Deficiency of other specified B group vitamins: Secondary | ICD-10-CM

## 2014-12-30 DIAGNOSIS — Z9849 Cataract extraction status, unspecified eye: Secondary | ICD-10-CM | POA: Diagnosis not present

## 2014-12-30 DIAGNOSIS — R1011 Right upper quadrant pain: Secondary | ICD-10-CM

## 2014-12-30 DIAGNOSIS — M25551 Pain in right hip: Secondary | ICD-10-CM

## 2014-12-30 DIAGNOSIS — H4011X3 Primary open-angle glaucoma, severe stage: Secondary | ICD-10-CM | POA: Diagnosis not present

## 2014-12-30 DIAGNOSIS — E1121 Type 2 diabetes mellitus with diabetic nephropathy: Secondary | ICD-10-CM

## 2014-12-30 DIAGNOSIS — N289 Disorder of kidney and ureter, unspecified: Secondary | ICD-10-CM

## 2014-12-30 DIAGNOSIS — E1129 Type 2 diabetes mellitus with other diabetic kidney complication: Secondary | ICD-10-CM

## 2014-12-30 HISTORY — DX: Constipation, unspecified: K59.00

## 2014-12-30 LAB — POCT URINALYSIS DIPSTICK
Bilirubin, UA: NEGATIVE
Blood, UA: NEGATIVE
GLUCOSE UA: NEGATIVE
Ketones, UA: NEGATIVE
Leukocytes, UA: NEGATIVE
Nitrite, UA: NEGATIVE
PROTEIN UA: NEGATIVE
Spec Grav, UA: >=1.03
Urobilinogen, UA: 0.2
pH, UA: 6

## 2014-12-30 LAB — COMPREHENSIVE METABOLIC PANEL
ALT: 30 U/L (ref 0–35)
AST: 26 U/L (ref 0–37)
Albumin: 4.2 g/dL (ref 3.5–5.2)
Alkaline Phosphatase: 60 U/L (ref 39–117)
BUN: 16 mg/dL (ref 6–23)
CO2: 31 mEq/L (ref 19–32)
Calcium: 9.5 mg/dL (ref 8.4–10.5)
Chloride: 104 mEq/L (ref 96–112)
Creatinine, Ser: 1.05 mg/dL (ref 0.40–1.20)
GFR: 53.47 mL/min — AB (ref >60.00)
GLUCOSE: 106 mg/dL — AB (ref 70–99)
POTASSIUM: 4.6 meq/L (ref 3.5–5.1)
Sodium: 138 mEq/L (ref 135–145)
TOTAL PROTEIN: 6.8 g/dL (ref 6.0–8.3)
Total Bilirubin: 0.6 mg/dL (ref 0.2–1.2)

## 2014-12-30 LAB — LDL CHOLESTEROL, DIRECT: Direct LDL: 101 mg/dL

## 2014-12-30 LAB — LIPASE: Lipase: 34 U/L (ref 11.0–59.0)

## 2014-12-30 LAB — VITAMIN B12: Vitamin B-12: 797 pg/mL (ref 211–911)

## 2014-12-30 LAB — HEMOGLOBIN A1C: Hgb A1c MFr Bld: 7.3 % — ABNORMAL HIGH (ref 4.6–6.5)

## 2014-12-30 LAB — HM DIABETES EYE EXAM

## 2014-12-30 LAB — VITAMIN D 25 HYDROXY (VIT D DEFICIENCY, FRACTURES): VITD: 28.51 ng/mL — ABNORMAL LOW (ref 30.00–100.00)

## 2014-12-30 NOTE — Assessment & Plan Note (Signed)
Check b12 today- if low, discuss IM B12 monthly injections here.

## 2014-12-30 NOTE — Progress Notes (Signed)
Pre visit review using our clinic review tool, if applicable. No additional management support is needed unless otherwise documented below in the visit note. 

## 2014-12-30 NOTE — Progress Notes (Signed)
Subjective:   Patient ID: [name] [name], female    DOB: [birth year], 81 y.o.   MRN: [id number]  [name] [name] is a pleasant 81 y.o. year old female who presents to clinic today with Hip Pain  on 12/30/2014  HPI: Right hip pain- on and off for months.  Had been receiving injections from Dr. [name] for left trochanteric bursitis which were very helpful. No known injury. She feels like it is really starting in abdomen and radiating towards her hip. RUQ has been tender to touch.  Intermittent- mainly when she sits down.  Does not seem to be affected by food.  No associated, nausea, vomiting, diarrhea, or fever.  Colonoscopy 03/02/09 ([facility])- diverticulosis.  Constipation- ongoing for months- with increased gasiness. Used to have 3-4 BMS per day.  Now strains to have one a couple of times a week. No new rx.  Does not take zyrtec regularly.  DM-On Glipizide 5 mg daily. Checks FSBS twice daily, doing very well with her diabetic diet. CBGs ranging 70s- low 103. Denies any episodes of hypoglycemia. Neg urine micro 05/2013.  Lab Results  Component Value Date   HGBA1C 6.8* 09/05/2014    HLD- at goal for diabetic. Takes Zetia. Statin intolerant.  Lab Results  Component Value Date   CHOL 177 03/18/2014   HDL 44.60 03/18/2014   LDLCALC 103* 03/18/2014   LDLDIRECT 97.1 06/04/2014   TRIG 146.0 03/18/2014   CHOLHDL 4 03/18/2014   Lab Results  Component Value Date   ALT 55* 09/05/2014   AST 51* 09/05/2014   ALKPHOS 57 09/05/2014   BILITOT 0.8 09/05/2014   B12 deficiency- taking OTC oral vit B12 but wonders if she could get IM injections here- "I take so many pills." Lab Results  Component Value Date   [id number] 322 09/13/2014     Current Outpatient Prescriptions on File Prior to Visit  Medication Sig Dispense Refill  . aspirin (ASPIRIN LOW DOSE) 81 MG tablet Take 81 mg by mouth daily.      . beta carotene w/minerals (OCUVITE) tablet Take 1 tablet by mouth daily.    .  cetirizine (ZYRTEC) 10 MG tablet Take 10 mg by mouth daily as needed for allergies.     . COMBIGAN 0.2-0.5 % ophthalmic solution     . CRANBERRY PO Take 1 tablet by mouth daily.    Marland Kitchen glipiZIDE (GLUCOTROL) 5 MG tablet TAKE ONE TABLET BY MOUTH TWICE DAILY 60 tablet 1  . isosorbide mononitrate (IMDUR) 30 MG 24 hr tablet TAKE ONE-HALF TABLET BY MOUTH DAILY 15 tablet 5  . lansoprazole (PREVACID) 15 MG capsule TAKE ONE CAPSULE BY MOUTH EVERY DAY 30 capsule 5  . losartan (COZAAR) 50 MG tablet TAKE ONE TABLET BY MOUTH EVERY DAY 30 tablet 3  . metoprolol succinate (TOPROL-XL) 100 MG 24 hr tablet TAKE ONE TABLET BY MOUTH EVERY MORNING 30 tablet 5  . MICROLET LANCETS MISC Use with bayer contour meter test blood sugar twice daily 100 each 5  . nitroGLYCERIN (NITROSTAT) 0.4 MG SL tablet Place 1 tablet (0.4 mg total) under the tongue every 5 (five) minutes as needed for chest pain. 30 tablet 3  . predniSONE (DELTASONE) 20 MG tablet     . TRUETEST TEST test strip TEST TWICE DAILY 100 each 1  . vitamin B-12 (CYANOCOBALAMIN) 500 MCG tablet Take 500 mcg by mouth daily.    Marland Kitchen ZETIA 10 MG tablet TAKE ONE TABLET BY MOUTH EVERY DAY 30 tablet 6  . [DISCONTINUED]  loratadine (CLARITIN) 10 MG tablet Take 10 mg by mouth as needed.      No current facility-administered medications on file prior to visit.    Allergies  Allergen Reactions  . Shellfish Allergy Anaphylaxis  . Celecoxib     REACTION: [place]  . Cephalexin     REACTION: trash and swelling  . Ciprofloxacin     REACTION: u/k  . Clopidogrel Bisulfate     REACTION: swelling, rash  . Codeine Phosphate     REACTION: unspecified  . Ezetimibe-Simvastatin     REACTION: myalgias  . Hydrocod Polst-Cpm Polst Er     REACTION: u/k  . Nitrofurantoin     REACTION: [place]  . Pregabalin     REACTION: felt bad  . Propoxyphene N-Acetaminophen     REACTION: [place]  . Rofecoxib     unk   . Sulfamethoxazole     REACTION: unspecified  . Sulfonamide Derivatives      REACTION: unspecified    Past Medical History  Diagnosis Date  . GERD (gastroesophageal reflux disease)   . CAD (coronary artery disease)     a. s/p tandem Promus DES to LAD in 2009 by Dr. Olevia Perches;  b.  LHC (03/22/14):  prox LAD 30%, mid LAD 40%, LAD stent ok with dist 20% ISR, apical LAD occluded with L-L collats filling apical vessel (too small for PCI), mid RCA 20%, EF 70%.  Med Rx  . Urticaria   . Hyperlipidemia     intol of statins  . Hypertension   . Allergy     Cipro, Plavix, Statins  . Personal history of colonic polyps   . Diabetes mellitus     Diagnosed 2012  . NHL (non-Hodgkin's lymphoma) dx'd 2003    chemo/xrt comp 2003  . Proctitis   . Diverticulosis of colon (without mention of hemorrhage)   . Hx of echocardiogram     a. Echo (07/2013):  Mild LVH, vigorous LVF, EF 65-70%, Gr 1 DD, mild MR, mild to mod LAE  . Hx of cardiovascular stress test     a. Nuclear (08/2013):  No ischemia, EF 80%, Normal    Past Surgical History  Procedure Laterality Date  . Vaginal hysterectomy      partial, fibroids, one ovary left  . Knee surgery  10/2003    left  . Coronary angioplasty with stent placement      x 2  . Dexa-neg    . Cardiac cath-neg    . Laser surgery for cataracts-left    . Stress cardiolite    . Left heart catheterization with coronary angiogram N/A 03/22/2014    Procedure: LEFT HEART CATHETERIZATION WITH CORONARY ANGIOGRAM;  Surgeon: Burnell Blanks, MD;  Location: [facility] CATH LAB;  Service: Cardiovascular;  Laterality: N/A;    Family History  Problem Relation Age of Onset  . Cancer Mother     jaw  . Hypertension Father   . Heart attack Father   . Colon cancer Neg Hx   . Heart attack Sister     History   Social History  . Marital Status: Married    Spouse Name: N/A    Number of Children: N/A  . Years of Education: N/A   Occupational History  . Retired    Social History Main Topics  . Smoking status: Never Smoker   . Smokeless tobacco: Never  Used  . Alcohol Use: No  . Drug Use: No  . Sexual Activity: Not on file   Other  Topics Concern  . Not on file   Social History Narrative   The PMH, PSH, Social History, Family History, Medications, and allergies have been reviewed in [facility], and have been updated if relevant.  Review of Systems  Constitutional: Negative for fever, chills, diaphoresis, appetite change, fatigue and unexpected weight change.  HENT: Negative.   Respiratory: Negative.   Cardiovascular: Negative.   Gastrointestinal: Positive for abdominal pain, constipation and abdominal distention. Negative for nausea, vomiting, diarrhea, blood in stool, anal bleeding and rectal pain.  Endocrine: Negative.   Genitourinary: Negative.   Musculoskeletal: Negative for joint swelling and gait problem.  Skin: Negative.   Neurological: Negative.   Hematological: Negative.   Psychiatric/Behavioral: Negative.   All other systems reviewed and are negative.      Objective:    BP 132/72 mmHg  Pulse 65  Temp(Src) 97.9 F (36.6 C) (Oral)  Wt 176 lb 12 oz (80.173 kg)  SpO2 96%   Physical Exam  Constitutional: She is oriented to person, place, and time. She appears well-developed and well-nourished. No distress.  HENT:  Head: Normocephalic.  Eyes: Conjunctivae are normal.  Neck: Normal range of motion.  Cardiovascular: Normal rate and regular rhythm.   Pulmonary/Chest: Effort normal and breath sounds normal. No respiratory distress.  Abdominal: Soft. Normal appearance. There is no hepatosplenomegaly. There is tenderness in the right upper quadrant, epigastric area and suprapubic area. There is no rigidity, no rebound, no guarding, no CVA tenderness, no tenderness at McBurney's point and negative Murphy's sign.  Musculoskeletal: Normal range of motion. She exhibits no edema.       Right hip: She exhibits tenderness. She exhibits normal range of motion, normal strength, no swelling, no crepitus, no deformity and no laceration.   Neurological: She is alert and oriented to person, place, and time.  Skin: Skin is warm and dry.  Psychiatric: She has a normal mood and affect. Her behavior is normal. Judgment and thought content normal.          Assessment & Plan:   Right hip pain  Constipation, unspecified constipation type  RUQ pain - Plan: US Abdomen Limited RUQ, Lipase  Renal insufficiency  Diabetes mellitus with renal manifestations, controlled - Plan: Hemoglobin A1c, Comprehensive metabolic panel  Mixed hyperlipidemia - Plan: LDL Cholesterol, Direct  Vitamin D deficiency - Plan: Vitamin D, 25-hydroxy  B12 deficiency - Plan: Vitamin B12 No Follow-up on file.

## 2014-12-30 NOTE — Assessment & Plan Note (Signed)
New- she is quite tender on exam. Not classic for biliary colic. Start work up with RUQ ultrasound however I question if she is having some referred pain from possible diverticulitis given that she does have some pain radiating to lower abdomen with tenderness on exam as well. Diverticulosis noted from last colonoscopy in 02/2009. Also is having some changes in bowel habits although constipation, not diarrhea.  Some of her pain may also be due to constipation- will need to rule out SBO if symptoms deteriorate.  Also check CMET and lipase today.

## 2014-12-30 NOTE — Assessment & Plan Note (Signed)
Well controlled- due for labs today. Orders Placed This Encounter  Procedures  . US Abdomen Limited RUQ  . Hemoglobin A1c  . Comprehensive metabolic panel  . Lipase  . Vitamin D, 25-hydroxy  . LDL Cholesterol, Direct  . Vitamin B12  . Urinalysis Dipstick

## 2014-12-30 NOTE — Assessment & Plan Note (Signed)
Deteriorated- known OA with h/o trochanteric bursitis. Advised contacting Dr. [name] for right hip injections since her left hip injections were successful.

## 2014-12-30 NOTE — Patient Instructions (Signed)
Good to see you. Please stop by to see Rosaria Ferries or Vaughan Basta on your way out to setup your ultrasound.  Start taking OTC Align daily for 2 weeks. Stop if not working in 2 weeks and consider Miralax.  Keep me updated.  We will call you with your lab results.

## 2014-12-31 ENCOUNTER — Ambulatory Visit
Admission: RE | Admit: 2014-12-31 | Discharge: 2014-12-31 | Disposition: A | Discharge status: Home / Self Care | Payer: Medicare PPO | Source: Ambulatory Visit | Attending: Family Medicine | Admitting: Family Medicine

## 2014-12-31 DIAGNOSIS — R1011 Right upper quadrant pain: Secondary | ICD-10-CM

## 2015-01-07 ENCOUNTER — Encounter: Payer: Self-pay | Admitting: Family Medicine

## 2015-02-11 ENCOUNTER — Other Ambulatory Visit: Payer: Self-pay | Admitting: Cardiovascular Disease

## 2015-03-04 ENCOUNTER — Ambulatory Visit (INDEPENDENT_AMBULATORY_CARE_PROVIDER_SITE_OTHER): Payer: Medicare PPO

## 2015-03-04 ENCOUNTER — Ambulatory Visit (INDEPENDENT_AMBULATORY_CARE_PROVIDER_SITE_OTHER): Payer: Medicare PPO | Admitting: Podiatry

## 2015-03-04 ENCOUNTER — Encounter: Payer: Self-pay | Admitting: Podiatry

## 2015-03-04 VITALS — BP 162/86 | HR 65 | Resp 16 | Ht 69.0 in | Wt 170.0 lb

## 2015-03-04 DIAGNOSIS — M79671 Pain in right foot: Secondary | ICD-10-CM | POA: Diagnosis not present

## 2015-03-04 DIAGNOSIS — M779 Enthesopathy, unspecified: Secondary | ICD-10-CM | POA: Diagnosis not present

## 2015-03-04 DIAGNOSIS — M79672 Pain in left foot: Secondary | ICD-10-CM | POA: Diagnosis not present

## 2015-03-04 NOTE — Progress Notes (Signed)
   Subjective:    Patient ID: [name] [name], female    DOB: 03/14/1932, 81 y.o.   MRN: [id number] Pt states that her right foot hurts on dorsal side and swells all over dorsal and plantar side up through her ankle.   HPI 81 year old female returns the office today with complaints of pain along side of a bunion on the right side as well as on the bottom of the big toe joint. She states that she previously hadn't injection to her ankle which helped those symptoms and her last appointment with Dr. Paulla Dolly he applied  a metatarsal pad. She states that she does not have much relief from the pad. Denies any recent injury or trauma. Denies any swelling or redness overlying the area. No other complaints at this time.   Review of Systems  All other systems reviewed and are negative.      Objective:   Physical Exam AAO 3, NAD DP/PT pulses palpable, CRT less than 3 seconds protective sensation intact with Simms Weinstein monofilament, vibratory sensation intact, Achilles tendon reflex intact. There is tenderness palpation overlying the medial aspect of the right first metatarsal head over site of mild bunion deformity. There is mild irritation directly overlying the first metatarsal head likely from shoe gear. There is no increase in warmth or any ascending cellulitis. There is also mild tenderness on the course of the sesamoid complex. There is mild discomfort with first MTPJ range of motion however there is no crepitation. No other areas of tenderness to bilateral lower extremities. No overlying edema, erythema, increase in warmth bilaterally. MMT 5/5, ROM WNL. No open lesions or pre-ulcerative lesions identified bilaterally. No pain with calf compression, swelling, warmth, erythema.     Assessment & Plan:  81 year old female right bunion pain, sesamoiditis -Treatment options were discussed the patient living alternatives, risks, complications -At this time I did discuss a steroid injection around the site  of maximal tenderness on the bunion. Patient agrees to injection after discussing risks and complications. Under sterile conditions a total of 1 mL mixture of dexamethasone phosphate and 0.5% Marcaine plain was infiltrated to the area of maximal tenderness along the medial aspect of the first metatarsal head. Bandage was applied. Post injection care was discussed the patient for which she understood. Patient tolerated the injection well without any competitions. -I dispensed offloading pad with a cutout for the sesamoids to help alleviate her symptoms. -In the future if symptoms continue would likely benefit from custom orthotics. -Follow-up as needed. In the meantime encouraged to call the office with any questions, concerns, change in symptoms.

## 2015-03-10 ENCOUNTER — Other Ambulatory Visit: Payer: Self-pay | Admitting: Family Medicine

## 2015-03-14 ENCOUNTER — Other Ambulatory Visit: Payer: Self-pay | Admitting: Family Medicine

## 2015-03-24 ENCOUNTER — Ambulatory Visit (INDEPENDENT_AMBULATORY_CARE_PROVIDER_SITE_OTHER): Payer: Medicare PPO | Admitting: Family Medicine

## 2015-03-24 ENCOUNTER — Encounter: Payer: Self-pay | Admitting: Family Medicine

## 2015-03-24 VITALS — BP 124/78 | HR 82 | Temp 98.0°F | Wt 179.2 lb

## 2015-03-24 DIAGNOSIS — J209 Acute bronchitis, unspecified: Secondary | ICD-10-CM

## 2015-03-24 DIAGNOSIS — B079 Viral wart, unspecified: Secondary | ICD-10-CM | POA: Diagnosis not present

## 2015-03-24 DIAGNOSIS — L820 Inflamed seborrheic keratosis: Secondary | ICD-10-CM | POA: Diagnosis not present

## 2015-03-24 DIAGNOSIS — L304 Erythema intertrigo: Secondary | ICD-10-CM | POA: Diagnosis not present

## 2015-03-24 MED ORDER — DOXYCYCLINE HYCLATE 100 MG PO TABS: 100.0000 mg | ORAL_TABLET | Freq: Two times a day (BID) | ORAL | Status: DC

## 2015-03-24 MED ORDER — EPINEPHRINE 0.3 MG/0.3ML IJ SOAJ
0.3000 mg | Freq: Once | INTRAMUSCULAR | Status: DC
Start: 2015-03-24 — End: 2015-05-28

## 2015-03-24 MED ORDER — BENZONATATE 200 MG PO CAPS: 200.0000 mg | ORAL_CAPSULE | Freq: Three times a day (TID) | ORAL | Status: DC | PRN

## 2015-03-24 NOTE — Patient Instructions (Signed)
Good to see you. Please take doxycyline as directed- 1 tablet twice daily for 10 days. Tessalon as needed for cough. Drink a lot of fluids.

## 2015-03-24 NOTE — Progress Notes (Signed)
Pre visit review using our clinic review tool, if applicable. No additional management support is needed unless otherwise documented below in the visit note. 

## 2015-03-24 NOTE — Progress Notes (Signed)
SUBJECTIVE:  [name] [name] is a 81 y.o. female who complains of coryza, congestion, productive cough and fever for 8 days. She denies a history of shortness of breath, sweats, vomiting and wheezing and denies a history of asthma. Patient denies smoke cigarettes.  Taking Tylenol and Sudafed.  FSBS have been ok.    Current Outpatient Prescriptions on File Prior to Visit  Medication Sig Dispense Refill  . aspirin (ASPIRIN LOW DOSE) 81 MG tablet Take 81 mg by mouth daily.      . beta carotene w/minerals (OCUVITE) tablet Take 1 tablet by mouth daily.    . cetirizine (ZYRTEC) 10 MG tablet Take 10 mg by mouth daily as needed for allergies.     . COMBIGAN 0.2-0.5 % ophthalmic solution     . CRANBERRY PO Take 1 tablet by mouth daily.    Marland Kitchen glipiZIDE (GLUCOTROL) 5 MG tablet TAKE ONE TABLET BY MOUTH TWICE DAILY 60 tablet 3  . isosorbide mononitrate (IMDUR) 30 MG 24 hr tablet TAKE ONE-HALF TABLET BY MOUTH DAILY 15 tablet 5  . lansoprazole (PREVACID) 15 MG capsule TAKE ONE CAPSULE BY MOUTH EVERY DAY 30 capsule 5  . losartan (COZAAR) 50 MG tablet TAKE ONE TABLET BY MOUTH EVERY DAY 30 tablet 3  . metoprolol succinate (TOPROL-XL) 100 MG 24 hr tablet TAKE ONE TABLET BY MOUTH EVERY MORNING 30 tablet 6  . MICROLET LANCETS MISC Use with bayer contour meter test blood sugar twice daily 100 each 5  . nitroGLYCERIN (NITROSTAT) 0.4 MG SL tablet Place 1 tablet (0.4 mg total) under the tongue every 5 (five) minutes as needed for chest pain. 30 tablet 3  . predniSONE (DELTASONE) 20 MG tablet     . TRUETEST TEST test strip TEST TWICE DAILY 100 each 1  . vitamin B-12 (CYANOCOBALAMIN) 500 MCG tablet Take 500 mcg by mouth daily.    Marland Kitchen ZETIA 10 MG tablet TAKE ONE TABLET BY MOUTH EVERY DAY 30 tablet 3  . [DISCONTINUED] loratadine (CLARITIN) 10 MG tablet Take 10 mg by mouth as needed.      No current facility-administered medications on file prior to visit.    Allergies  Allergen Reactions  . Shellfish Allergy  Anaphylaxis and Rash  . Cephalexin     REACTION: trash and swelling REACTION: trash and swelling  . Ciprofloxacin     REACTION: u/k  . Clopidogrel Bisulfate     REACTION: swelling, rash  . Codeine Phosphate     REACTION: unspecified  . Ezetimibe-Simvastatin     REACTION: myalgias REACTION: myalgias  . Hydrocod Polst-Cpm Polst Er     REACTION: u/k  . Nitrofurantoin     REACTION: [place] REACTION: [place]  . Pregabalin     REACTION: felt bad REACTION: felt bad  . Propoxyphene N-Acetaminophen     REACTION: [place]  . Rofecoxib     unk unk   . Sulfa Antibiotics     REACTION: unspecified  . Sulfamethoxazole     REACTION: unspecified  . Sulfonamide Derivatives     REACTION: unspecified  . Celecoxib Rash    REACTION: [place]  . Codeine Rash    REACTION: [place]  . Hydrocod Polst-Cpm Polst Er Rash  . Other Rash  . Propoxyphene Rash    Past Medical History  Diagnosis Date  . GERD (gastroesophageal reflux disease)   . CAD (coronary artery disease)     a. s/p tandem Promus DES to LAD in 2009 by Dr. Olevia Perches;  b.  LHC (03/22/14):  prox  LAD 30%, mid LAD 40%, LAD stent ok with dist 20% ISR, apical LAD occluded with L-L collats filling apical vessel (too small for PCI), mid RCA 20%, EF 70%.  Med Rx  . Urticaria   . Hyperlipidemia     intol of statins  . Hypertension   . Allergy     Cipro, Plavix, Statins  . Personal history of colonic polyps   . Diabetes mellitus     Diagnosed 2012  . NHL (non-Hodgkin's lymphoma) dx'd 2003    chemo/xrt comp 2003  . Proctitis   . Diverticulosis of colon (without mention of hemorrhage)   . Hx of echocardiogram     a. Echo (07/2013):  Mild LVH, vigorous LVF, EF 65-70%, Gr 1 DD, mild MR, mild to mod LAE  . Hx of cardiovascular stress test     a. Nuclear (08/2013):  No ischemia, EF 80%, Normal    Past Surgical History  Procedure Laterality Date  . Vaginal hysterectomy      partial, fibroids, one ovary left  . Knee surgery  10/2003    left  . Coronary  angioplasty with stent placement      x 2  . Dexa-neg    . Cardiac cath-neg    . Laser surgery for cataracts-left    . Stress cardiolite    . Left heart catheterization with coronary angiogram N/A 03/22/2014    Procedure: LEFT HEART CATHETERIZATION WITH CORONARY ANGIOGRAM;  Surgeon: Burnell Blanks, MD;  Location: [facility] CATH LAB;  Service: Cardiovascular;  Laterality: N/A;    Family History  Problem Relation Age of Onset  . Cancer Mother     jaw  . Hypertension Father   . Heart attack Father   . Colon cancer Neg Hx   . Heart attack Sister     History   Social History  . Marital Status: Married    Spouse Name: N/A  . Number of Children: N/A  . Years of Education: N/A   Occupational History  . Retired    Social History Main Topics  . Smoking status: Never Smoker   . Smokeless tobacco: Never Used  . Alcohol Use: No  . Drug Use: No  . Sexual Activity: Not on file   Other Topics Concern  . Not on file   Social History Narrative   The PMH, PSH, Social History, Family History, Medications, and allergies have been reviewed in [facility], and have been updated if relevant.  OBJECTIVE:  BP 124/78 mmHg  Pulse 82  Temp(Src) 98 F (36.7 C) (Oral)  Wt 179 lb 4 oz (81.307 kg)  SpO2 98%  She appears well, vital signs are as noted. Ears normal.  Throat and pharynx normal.  Neck supple. No adenopathy in the neck. Nose is congested. Sinuses non tender.  Right sided exp wheezes and rales  ASSESSMENT:  bronchitis  PLAN: Doxycyline 100 mg twice daily x 10 days and tessalon as needed for cough. Symptomatic therapy suggested: push fluids, rest and return office visit prn if symptoms persist or worsen.Call or return to clinic prn if these symptoms worsen or fail to improve as anticipated.

## 2015-03-31 ENCOUNTER — Other Ambulatory Visit: Payer: Self-pay | Admitting: Cardiovascular Disease

## 2015-03-31 DIAGNOSIS — H4011X3 Primary open-angle glaucoma, severe stage: Secondary | ICD-10-CM | POA: Diagnosis not present

## 2015-04-01 ENCOUNTER — Ambulatory Visit (INDEPENDENT_AMBULATORY_CARE_PROVIDER_SITE_OTHER): Payer: Medicare PPO | Admitting: Podiatry

## 2015-04-01 VITALS — BP 112/78 | HR 88 | Resp 16

## 2015-04-01 DIAGNOSIS — M2010 Hallux valgus (acquired), unspecified foot: Secondary | ICD-10-CM

## 2015-04-01 DIAGNOSIS — M779 Enthesopathy, unspecified: Secondary | ICD-10-CM

## 2015-04-02 NOTE — Progress Notes (Signed)
Patient ID: [name] [name], female   DOB: 1932-10-29, 81 y.o.   MRN: [id number]  Subjective: 81 year old female presents the ossific follow-up evaluation of right bunion pain and sesamoiditis. She states that since the injection last appointment she is significantly in proved compared to she was prior although she is not 100% yet. She does continue gets some intermittent pain which is mostly dependent on shoe gear. She denies any recent injury or trauma. Denies any swelling or redness. Denies any numbness or tingling. Denies any systemic complaints such as fevers, chills, nausea, vomiting. No acute changes since last appointment, and no other complaints at this time.   Objective: AAO x3, NAD DP/PT pulses palpable bilaterally, CRT less than 3 seconds Protective sensation intact with Simms Weinstein monofilament, vibratory sensation intact, Achilles tendon reflex intact There is no tenderness to palpation along the medial aspect of the right first metatarsal head of the site of the bunion. There is no tenderness along the sesamoids. There is mild discomfort on second MTPJ however there is no pinpoint bony tenderness or pain the vibratory sensation on the digits, metatarsals or other areas of the foot/ankle. There is no pain with MTPJ range of motion. No subluxation of the digit. There is prominent metatarsal head plantarly with atrophy of the fat pad No other areas of pinpoint bony tenderness or pain with vibratory sensation. MMT 5/5, ROM WNL. No edema, erythema, increase in warmth to bilateral lower extremities.  No open lesions or pre-ulcerative lesions.  No pain with calf compression, swelling, warmth, erythema  Assessment: 81 year old female with right bunion, resolved sesamoiditis, second MTPJ capsulitis  Plan: -All treatment options discussed with the patient including all alternatives, risks, complications.  I do the patient would benefit from orthotics however she does not want orthotics at  this time as she wear sandals. I discussed with her shoe gear modifications. Also dispensed. Offloading pads to help take pressure off the second MTPJ. -She was going on vacation to [facility] in the next few weeks, and she will follow-up after if symptoms continue or worsen. In the meantime, encouraged to call with any questions/concerns/change in symptoms.  -Patient encouraged to call the office with any questions, concerns, change in symptoms.

## 2015-04-10 ENCOUNTER — Telehealth: Payer: Self-pay | Admitting: Family Medicine

## 2015-04-10 NOTE — Telephone Encounter (Signed)
Pt called she is having her mammogram Tuesday 5/17 @ [facility].  She called her insurance company  They will not pay for 3d mammogram unless the medical doctor request that it be done/   Pt stated she has already had a biospy on right breast  Please advise pt if she can have 3d mammogram

## 2015-04-10 NOTE — Telephone Encounter (Signed)
Lm on pts vm and advised per DrARon

## 2015-04-10 NOTE — Telephone Encounter (Signed)
She could have a1c done now- anytime after 5/1

## 2015-04-10 NOTE — Telephone Encounter (Signed)
Request faxed to [facility]

## 2015-04-10 NOTE — Telephone Encounter (Signed)
Yes I do advise that she does get a 3 D mammogram given her breast history.

## 2015-04-10 NOTE — Telephone Encounter (Signed)
Spoke to [place] at [place] and she informed me to have Dr. Deborra Medina sign examination request including request for 3D. Forms in Dr. Deborra Medina inbox.  Also spoke to pt and informed her that since she does not have traditional [place] [place] [employer] medicare that the 3D may not be covered at 100%. She also is aware that [facility]'s protocol is to collect $55.00 upfront at check in and she is to just tell them to bill her and after visit is ran through [place] and if still not covered she will get a bill (which if [employer] told her it would be covered if medical dr requested 3D--she should not get bill).

## 2015-04-10 NOTE — Telephone Encounter (Signed)
Pt would like to know when she needs her sugar re-checked?  Pt states it is ok to leave message on home phone and she will call back to schedule lab.

## 2015-04-16 ENCOUNTER — Telehealth: Payer: Self-pay | Admitting: Family Medicine

## 2015-04-16 ENCOUNTER — Other Ambulatory Visit: Payer: Self-pay | Admitting: Family Medicine

## 2015-04-16 ENCOUNTER — Encounter: Payer: Self-pay | Admitting: Family Medicine

## 2015-04-16 NOTE — Telephone Encounter (Signed)
Patient returned [facility] call.  Please call her back before 9:00 on the home phone or after 9:00 on cell phone  [phone].

## 2015-04-16 NOTE — Telephone Encounter (Signed)
Spoke to pt and advised pt that she is able to have A1C completed anytime.

## 2015-04-21 ENCOUNTER — Other Ambulatory Visit (INDEPENDENT_AMBULATORY_CARE_PROVIDER_SITE_OTHER): Payer: Medicare PPO

## 2015-04-21 DIAGNOSIS — E1121 Type 2 diabetes mellitus with diabetic nephropathy: Secondary | ICD-10-CM | POA: Diagnosis not present

## 2015-04-21 DIAGNOSIS — E1129 Type 2 diabetes mellitus with other diabetic kidney complication: Secondary | ICD-10-CM

## 2015-04-21 LAB — HEMOGLOBIN A1C: Hgb A1c MFr Bld: 7 % — ABNORMAL HIGH (ref 4.6–6.5)

## 2015-04-22 ENCOUNTER — Encounter: Payer: Self-pay | Admitting: *Deleted

## 2015-05-28 ENCOUNTER — Encounter: Payer: Self-pay | Admitting: Gastroenterology

## 2015-05-28 ENCOUNTER — Ambulatory Visit (INDEPENDENT_AMBULATORY_CARE_PROVIDER_SITE_OTHER): Payer: Medicare PPO | Admitting: Gastroenterology

## 2015-05-28 VITALS — BP 114/62 | HR 72 | Ht 69.0 in | Wt 180.6 lb

## 2015-05-28 DIAGNOSIS — K589 Irritable bowel syndrome without diarrhea: Secondary | ICD-10-CM

## 2015-05-28 DIAGNOSIS — R109 Unspecified abdominal pain: Secondary | ICD-10-CM

## 2015-05-28 DIAGNOSIS — K5909 Other constipation: Secondary | ICD-10-CM

## 2015-05-28 MED ORDER — DICYCLOMINE HCL 10 MG PO CAPS: 10.0000 mg | ORAL_CAPSULE | Freq: Three times a day (TID) | ORAL | Status: DC | PRN

## 2015-05-28 NOTE — Progress Notes (Signed)
    History of Present Illness: This is an 81 year old female previously followed by Dr. Verl Blalock. She has a history of rectosigmoid lymphoma in 2002 and irritable bowel syndrome. She underwent colonoscopy in 02/2009 that showed some mild radiation changes in the rectal area but was otherwise unremarkable. Colonoscopy in 09/2005 showed diverticulosis in the descending colon and sigmoid colon. She last saw Dr. Sharlett Iles 2 years ago and had problems with crampy right-sided flank and abdominal pain, constipation, gas and bloating.  She continues to have the same symptoms. When her constipation is under better control she has no right-sided abdominal pain however she has occasional gas and bloating. Recently she has been taking MiraLAX on a daily basis with good control of all her symptoms.  Current Medications, Allergies, Past Medical History, Past Surgical History, Family History and Social History were reviewed in [employer] record.  Physical Exam: General: Well developed , well nourished, no acute distress Head: Normocephalic and atraumatic Eyes:  sclerae anicteric, EOMI Ears: Normal auditory acuity Mouth: No deformity or lesions Lungs: Clear throughout to auscultation Heart: Regular rate and rhythm; no murmurs, rubs or bruits Abdomen: Soft, non tender and non distended. No masses, hepatosplenomegaly or hernias noted. Normal Bowel sounds Musculoskeletal: Symmetrical with no gross deformities  Pulses:  Normal pulses noted Extremities: No clubbing, cyanosis, edema or deformities noted Neurological: Alert oriented x 4, grossly nonfocal Psychological:  Alert and cooperative. Normal mood and affect  Assessment and Recommendations:  1.  IBS constipation. Right-sided flank and abdominal pain relieved with treatment of constipation. Continue MiraLAX 1-3 times daily titrated for adequate bowel movements. Dicyclomine 10 mg 3 times a day when necessary. Gas-X  4 times a day  when necessary. Avoid foods that worsen symptoms. Follow in 1 year and prn.  2.  GERD. Well controlled on Prevacid 15 mg daily and standard antireflux measures.

## 2015-05-28 NOTE — Patient Instructions (Addendum)
You can take Gas- X four times a day as needed.   You can take Miralax mixing 17 grams in 8 oz of water 1-3 x daily.  We have sent the following medications to your pharmacy for you to pick up at your convenience:Bentyl.  You can follow up with Dr. Fuller Plan as needed.  Normal BMI (Body Mass Index- based on height and weight) is between 23 and 30. Your BMI today is Body mass index is 26.66 kg/(m^2). Marland Kitchen Please consider follow up  regarding your BMI with your Primary Care Provider.  Thank you for choosing me and [facility].  Pricilla Riffle. Dagoberto Ligas., MD., Marval Regal

## 2015-06-05 ENCOUNTER — Encounter: Payer: Self-pay | Admitting: Cardiovascular Disease

## 2015-06-05 ENCOUNTER — Ambulatory Visit (INDEPENDENT_AMBULATORY_CARE_PROVIDER_SITE_OTHER): Payer: Medicare PPO | Admitting: Cardiovascular Disease

## 2015-06-05 ENCOUNTER — Other Ambulatory Visit: Payer: Self-pay | Admitting: Cardiovascular Disease

## 2015-06-05 VITALS — BP 110/70 | HR 63 | Ht 67.0 in | Wt 177.8 lb

## 2015-06-05 DIAGNOSIS — I340 Nonrheumatic mitral (valve) insufficiency: Secondary | ICD-10-CM | POA: Diagnosis not present

## 2015-06-05 DIAGNOSIS — I2510 Atherosclerotic heart disease of native coronary artery without angina pectoris: Secondary | ICD-10-CM

## 2015-06-05 DIAGNOSIS — E785 Hyperlipidemia, unspecified: Secondary | ICD-10-CM | POA: Diagnosis not present

## 2015-06-05 DIAGNOSIS — M79671 Pain in right foot: Secondary | ICD-10-CM

## 2015-06-05 DIAGNOSIS — R0989 Other specified symptoms and signs involving the circulatory and respiratory systems: Secondary | ICD-10-CM

## 2015-06-05 DIAGNOSIS — M79672 Pain in left foot: Secondary | ICD-10-CM

## 2015-06-05 DIAGNOSIS — I10 Essential (primary) hypertension: Secondary | ICD-10-CM

## 2015-06-05 NOTE — Patient Instructions (Signed)
Medication Instructions:  Your physician recommends that you continue on your current medications as directed. Please refer to the Current Medication list given to you today.   Labwork: none  Testing/Procedures:  Your physician has requested that you have a lower   extremity arterial duplex. This test is an ultrasound of the arteries in the legs   It looks at arterial blood flow in the legs  Allow one hour for Lower   Arterial scans. There are no restrictions or special instructions    Follow-Up: Your physician wants you to follow-up in:  6 months.  You will receive a reminder letter in the mail two months in advance. If you don't receive a letter, please call our office to schedule the follow-up appointment.

## 2015-06-05 NOTE — Progress Notes (Signed)
4  Chief Complaint  Patient presents with  . Shortness of Breath      History of Present Illness: 81 yo female with history of CAD, HLD, HTN, non-Hodgkin's lymphoma, DM here today for cardiac follow up. She has been followed in the past by Dr. Olevia Perches. In 2009 she had tandem non-overlapping Promus drug-eluting stents placed in the LAD. She was evaluated in 2010 for chest pain and had a negative Myoview scan. She has been intolerant to statins and Plavix in the past. She has been on Zetia. Stress myoview 09/05/13 without ischemia. Echo 08/23/13 with normal LV size and function, mild MR. I saw her April 2015 and she c/o chest pain. Cardiac cath was arranged. This demonstrated a patent LAD stent. She did have an occluded apical LAD with left to left collaterals. This vessel was too small for PCI. She had nonobstructive disease elsewhere.  She is here for follow up. She is doing well. She has no chest pain or SOB. She does note fatigue and occasional dyspnea. Also bilateral foot pain. No calf pain but left hip and buttock pain.   Primary Care Physician: Arnette Norris  Last Lipid Profile:Lipid Panel     Component Value Date/Time   CHOL 177 03/18/2014 0914   TRIG 146.0 03/18/2014 0914   HDL 44.60 03/18/2014 0914   CHOLHDL 4 03/18/2014 0914   VLDL 29.2 03/18/2014 0914   LDLCALC 103* 03/18/2014 0914     Past Medical History  Diagnosis Date  . GERD (gastroesophageal reflux disease)   . CAD (coronary artery disease)     a. s/p tandem Promus DES to LAD in 2009 by Dr. Olevia Perches;  b.  LHC (03/22/14):  prox LAD 30%, mid LAD 40%, LAD stent ok with dist 20% ISR, apical LAD occluded with L-L collats filling apical vessel (too small for PCI), mid RCA 20%, EF 70%.  Med Rx  . Urticaria   . Hyperlipidemia     intol of statins  . Hypertension   . Allergy     Cipro, Plavix, Statins  . Personal history of colonic polyps   . Diabetes mellitus     Diagnosed 2012  . NHL (non-Hodgkin's lymphoma) dx'd 2003   chemo/xrt comp 2003  . Proctitis   . Diverticulosis of colon (without mention of hemorrhage)   . Hx of echocardiogram     a. Echo (07/2013):  Mild LVH, vigorous LVF, EF 65-70%, Gr 1 DD, mild MR, mild to mod LAE  . Hx of cardiovascular stress test     a. Nuclear (08/2013):  No ischemia, EF 80%, Normal    Past Surgical History  Procedure Laterality Date  . Vaginal hysterectomy      partial, fibroids, one ovary left  . Knee surgery  10/2003    left  . Coronary angioplasty with stent placement      x 2  . Dexa-neg    . Cardiac cath-neg    . Laser surgery for cataracts-left    . Stress cardiolite    . Left heart catheterization with coronary angiogram N/A 03/22/2014    Procedure: LEFT HEART CATHETERIZATION WITH CORONARY ANGIOGRAM;  Surgeon: Burnell Blanks, MD;  Location: [facility] CATH LAB;  Service: Cardiovascular;  Laterality: N/A;    Current Outpatient Prescriptions  Medication Sig Dispense Refill  . aspirin (ASPIRIN LOW DOSE) 81 MG tablet Take 81 mg by mouth daily.      . beta carotene w/minerals (OCUVITE) tablet Take 1 tablet by mouth daily.    Marland Kitchen  cetirizine (ZYRTEC) 10 MG tablet Take 10 mg by mouth daily as needed for allergies.     . Cholecalciferol (VITAMIN D-3) 1000 UNITS CAPS Take 1 capsule by mouth daily.    . COMBIGAN 0.2-0.5 % ophthalmic solution 1 drop every 12 (twelve) hours.     . CRANBERRY PO Take 1 tablet by mouth daily.    Marland Kitchen dicyclomine (BENTYL) 10 MG capsule Take 1 capsule (10 mg total) by mouth 3 (three) times daily as needed for spasms. 90 capsule 5  . glipiZIDE (GLUCOTROL) 5 MG tablet TAKE ONE TABLET BY MOUTH TWICE DAILY 60 tablet 3  . isosorbide mononitrate (IMDUR) 30 MG 24 hr tablet TAKE ONE-HALF TABLET BY MOUTH DAILY 15 tablet 5  . lansoprazole (PREVACID) 15 MG capsule TAKE ONE CAPSULE BY MOUTH EVERY DAY 30 capsule 5  . losartan (COZAAR) 50 MG tablet TAKE ONE TABLET BY MOUTH EVERY DAY 30 tablet 1  . metoprolol succinate (TOPROL-XL) 100 MG 24 hr tablet TAKE ONE  TABLET BY MOUTH EVERY MORNING 30 tablet 6  . MICROLET LANCETS MISC Use with bayer contour meter test blood sugar twice daily 100 each 5  . nitroGLYCERIN (NITROSTAT) 0.4 MG SL tablet Place 1 tablet (0.4 mg total) under the tongue every 5 (five) minutes as needed for chest pain. 30 tablet 3  . TRUETEST TEST test strip TEST TWICE DAILY 100 each 1  . vitamin B-12 (CYANOCOBALAMIN) 500 MCG tablet Take 500 mcg by mouth daily.    . vitamin C (ASCORBIC ACID) 500 MG tablet Take 500 mg by mouth daily.    Marland Kitchen ZETIA 10 MG tablet TAKE ONE TABLET BY MOUTH EVERY DAY 30 tablet 3  . [DISCONTINUED] loratadine (CLARITIN) 10 MG tablet Take 10 mg by mouth as needed.      No current facility-administered medications for this visit.    Allergies  Allergen Reactions  . Shellfish Allergy Anaphylaxis and Rash  . Cephalexin     REACTION: trash and swelling REACTION: trash and swelling  . Ciprofloxacin     REACTION: u/k  . Clopidogrel Bisulfate     REACTION: swelling, rash  . Codeine Phosphate     REACTION: unspecified  . Ezetimibe-Simvastatin     REACTION: myalgias REACTION: myalgias  . Hydrocod Polst-Cpm Polst Er     REACTION: u/k  . Nitrofurantoin     REACTION: [place] REACTION: [place]  . Pregabalin     REACTION: felt bad REACTION: felt bad  . Propoxyphene N-Acetaminophen     REACTION: [place]  . Rofecoxib     unk unk   . Sulfa Antibiotics     REACTION: unspecified  . Sulfamethoxazole     REACTION: unspecified  . Sulfonamide Derivatives     REACTION: unspecified  . Celecoxib Rash    REACTION: [place]  . Codeine Rash    REACTION: [place]  . Hydrocod Polst-Cpm Polst Er Rash  . Other Rash  . Propoxyphene Rash    History   Social History  . Marital Status: Married    Spouse Name: N/A  . Number of Children: N/A  . Years of Education: N/A   Occupational History  . Retired    Social History Main Topics  . Smoking status: Never Smoker   . Smokeless tobacco: Never Used  . Alcohol Use: No  . Drug Use: No    . Sexual Activity: Not on file   Other Topics Concern  . Not on file   Social History Narrative    Family History  Problem Relation Age of Onset  . Cancer Mother     jaw  . Hypertension Father   . Heart attack Father   . Colon cancer Neg Hx   . Heart attack Sister     Review of Systems:  As stated in the HPI and otherwise negative.   BP 110/70 mmHg  Pulse 63  Ht 5\' 7"  (1.702 m)  Wt 177 lb 12.8 oz (80.65 kg)  BMI 27.84 kg/m2  SpO2 94%  Physical Examination: General: Well developed, well nourished, NAD HEENT: OP clear, mucus membranes moist SKIN: warm, dry. No rashes. Neuro: No focal deficits Musculoskeletal: Muscle strength 5/5 all ext Psychiatric: Mood and affect normal Neck: No JVD, no carotid bruits, no thyromegaly, no lymphadenopathy. Lungs:Clear bilaterally, no wheezes, rhonci, crackles Cardiovascular: Loletha Grayer, Regular rhythm. No murmurs, gallops or rubs. Abdomen:Soft. Bowel sounds present. Non-tender.  Extremities: No lower extremity edema. Pulses are 2 + in the bilateral DP/PT.  Exercise stress myoview: 09/05/13:  Stress Procedure: The patient exercised on the treadmill utilizing the Bruce Protocol for 7:06 minutes. The patient stopped due to sob, jaw pain, chest pressure and chest pain. Technetium 75m Sestamibi was injected at peak exercise and myocardial perfusion imaging was performed after a brief delay.  Stress ECG: No significant change from baseline ECG  QPS  Raw Data Images: Normal; no motion artifact; normal heart/lung ratio.  Stress Images: Normal homogeneous uptake in all areas of the myocardium.  Rest Images: Normal homogeneous uptake in all areas of the myocardium.  Subtraction (SDS): No evidence of ischemia.  Transient Ischemic Dilatation (Normal <1.22): N/A  Lung/Heart Ratio (Normal <0.45): 0.34  Quantitative Gated Spect Images  QGS EDV: 53 ml  QGS ESV: 11 ml  Impression  Exercise Capacity: Lexiscan with low level exercise.  BP Response:  Normal blood pressure response.  Clinical Symptoms: Significant chest pain.  ECG Impression: No significant ST segment change suggestive of ischemia.  Comparison with Prior Nuclear Study: No significant change from previous study  Overall Impression: Normal stress nuclear study. Patient had chest pain. No EKG changes. Normal perfusion. LV systolic function is vigorous.  LV Ejection Fraction: 80%. LV Wall Motion: NL LV Function; NL Wall Motion  Echo 08/23/13: Left ventricle: The cavity size was normal. There was mild concentric hypertrophy. Systolic function was vigorous. The estimated ejection fraction was in the range of 65% to 70%. Wall motion was normal; there were no regional wall motion abnormalities. Doppler parameters are consistent with abnormal left ventricular relaxation (grade 1 diastolic dysfunction). - Mitral valve: Mildly calcified annulus. Mild regurgitation. - Left atrium: The atrium was mildly to moderately dilated.  Cardiac cath 03/22/14: Left main: No obstructive disease.  Left Anterior Descending Artery: Large caliber vessel that courses to the apex. 30% proximal stenosis. 40% mid stenosis just prior to the stented segment. Mid stented segment patent without restenosis. Distal stented segment patent with 20% restenosis. Apical LAD becomes very small in caliber and has total occlusion with filling of apical vessel by left to left collaterals. (0.75 mm vessel).  Circumflex Artery: Large caliber vessel with several small OM branches. Diffuse luminal irregularities in the mid vessel.  Right Coronary Artery: Large dominant vessel with 20% mid stenosis.  Left Ventricular Angiogram: LVEF=70%.   EKG:  EKG is not ordered today. The ekg ordered today demonstrates   Recent Labs: 09/13/2014: Hemoglobin 14.1; Platelets 252.0; TSH 1.19 12/30/2014: ALT 30; BUN 16; Creatinine, Ser 1.05; Potassium 4.6; Sodium 138   Lipid Panel    Component Value  Date/Time   CHOL 177 03/18/2014  0914   TRIG 146.0 03/18/2014 0914   HDL 44.60 03/18/2014 0914   CHOLHDL 4 03/18/2014 0914   VLDL 29.2 03/18/2014 0914   LDLCALC 103* 03/18/2014 0914   LDLDIRECT 101.0 12/30/2014 1208     Wt Readings from Last 3 Encounters:  06/05/15 177 lb 12.8 oz (80.65 kg)  05/28/15 180 lb 9.6 oz (81.92 kg)  03/24/15 179 lb 4 oz (81.307 kg)     Other studies Reviewed: Additional studies/ records that were reviewed today include: . Review of the above records demonstrates:    Assessment and Plan:   1. CORONARY ARTERY DISEASE: Stable. Continue Zetia, ASA, Toprol. She is intolerant to statins.   2. Hyperlipidemia: Lipids well controlled. Will continue Zetia. She is intolerant of statins.   3. HTN: BP controlled. Continue Cozaar and Toprol.   4. Mitral regurgitation: Mild by echo September 2014. Repeat echo after next visit  5. Bilateral foot pain: No claudication but pulses are reduced in left foot. Will arrange LE dopplers with ABI  Current medicines are reviewed at length with the patient today.  The patient does not have concerns regarding medicines.  The following changes have been made:  no change  Labs/ tests ordered today include:  No orders of the defined types were placed in this encounter.    Disposition:   FU with me in 6 months  Signed, Lauree Chandler, MD 06/05/2015 11:45 AM    [address], [place], [place]  [address] Phone: [phone]; Fax: [phone]

## 2015-06-09 ENCOUNTER — Ambulatory Visit (HOSPITAL_COMMUNITY): Payer: Medicare PPO | Attending: Internal Medicine

## 2015-06-09 ENCOUNTER — Other Ambulatory Visit: Payer: Self-pay | Admitting: Cardiovascular Disease

## 2015-06-09 DIAGNOSIS — L304 Erythema intertrigo: Secondary | ICD-10-CM | POA: Diagnosis not present

## 2015-06-09 DIAGNOSIS — L820 Inflamed seborrheic keratosis: Secondary | ICD-10-CM | POA: Diagnosis not present

## 2015-06-09 DIAGNOSIS — R0989 Other specified symptoms and signs involving the circulatory and respiratory systems: Secondary | ICD-10-CM | POA: Insufficient documentation

## 2015-06-11 ENCOUNTER — Telehealth: Payer: Self-pay | Admitting: Cardiovascular Disease

## 2015-06-11 NOTE — Telephone Encounter (Signed)
Contacted the pt to inform her that per Dr Angelena Form, she had normal ABI bilaterally. Pt verbalized understanding.

## 2015-06-11 NOTE — Telephone Encounter (Signed)
New message  ° ° °Patient calling for test results.   °

## 2015-06-14 ENCOUNTER — Other Ambulatory Visit: Payer: Self-pay | Admitting: Cardiovascular Disease

## 2015-06-16 ENCOUNTER — Other Ambulatory Visit: Payer: Self-pay

## 2015-06-16 MED ORDER — EZETIMIBE 10 MG PO TABS: 10.0000 mg | ORAL_TABLET | Freq: Every day | ORAL | Status: DC

## 2015-06-17 DIAGNOSIS — M899 Disorder of bone, unspecified: Secondary | ICD-10-CM | POA: Diagnosis not present

## 2015-06-23 ENCOUNTER — Encounter: Payer: Self-pay | Admitting: Gastroenterology

## 2015-06-23 ENCOUNTER — Other Ambulatory Visit: Payer: Self-pay | Admitting: Cardiovascular Disease

## 2015-06-24 ENCOUNTER — Encounter: Payer: Self-pay | Admitting: Family Medicine

## 2015-06-24 ENCOUNTER — Other Ambulatory Visit: Payer: Self-pay

## 2015-06-24 MED ORDER — ISOSORBIDE MONONITRATE ER 30 MG PO TB24: 15.0000 mg | ORAL_TABLET | Freq: Every day | ORAL | Status: DC

## 2015-06-25 ENCOUNTER — Ambulatory Visit (INDEPENDENT_AMBULATORY_CARE_PROVIDER_SITE_OTHER): Payer: Medicare PPO | Admitting: Family Medicine

## 2015-06-25 ENCOUNTER — Encounter: Payer: Self-pay | Admitting: Family Medicine

## 2015-06-25 VITALS — BP 118/60 | HR 66 | Temp 98.2°F | Ht 67.0 in | Wt 177.2 lb

## 2015-06-25 DIAGNOSIS — M7062 Trochanteric bursitis, left hip: Secondary | ICD-10-CM

## 2015-06-25 DIAGNOSIS — M25552 Pain in left hip: Secondary | ICD-10-CM | POA: Diagnosis not present

## 2015-06-25 MED ORDER — METHYLPREDNISOLONE ACETATE 40 MG/ML IJ SUSP
80.0000 mg | Freq: Once | INTRAMUSCULAR | Status: AC
  Administered 2015-06-25: 80 mg via INTRA_ARTICULAR

## 2015-06-25 NOTE — Progress Notes (Signed)
   Dr. Frederico Hamman T. Kevaughn Ewing, MD, [facility] [place], [address] Phone: [phone] Fax: [phone]  06/25/2015  Patient: [name] [name], MRN: [id number], DOB: [birth year], 81 y.o.  Primary Physician:  Arnette Norris, MD  Chief Complaint: Hip Pain    Procedure only   Obvious trochanteric bursitis on the left.  Trochanteric Bursitis Injection, LEFT Verbal consent obtained. Risks (including infection, potential atrophy), benefits, and alternatives reviewed. Greater trochanter sterilely prepped with Chloraprep. Ethyl Chloride used for anesthesia. 8 cc of Lidocaine 1% injected with Depo-Medrol 80 mg into trochanteric bursa at area of maximal tenderness at greater trochanter. Needle taken to bone to troch bursa, flows easily. Bursa massaged. No bleeding and no complications. Decreased pain after injection. Needle: 22 gauge spinal needle   Signed,  Korinne Greenstein T. Shellyann Wandrey, MD

## 2015-06-25 NOTE — Progress Notes (Signed)
Pre visit review using our clinic review tool, if applicable. No additional management support is needed unless otherwise documented below in the visit note. 

## 2015-07-04 ENCOUNTER — Encounter: Payer: Self-pay | Admitting: *Deleted

## 2015-07-08 ENCOUNTER — Other Ambulatory Visit: Payer: Self-pay | Admitting: Family Medicine

## 2015-07-28 ENCOUNTER — Other Ambulatory Visit: Payer: Self-pay | Admitting: Cardiovascular Disease

## 2015-07-28 ENCOUNTER — Other Ambulatory Visit: Payer: Self-pay | Admitting: Family Medicine

## 2015-08-01 DIAGNOSIS — H4011X3 Primary open-angle glaucoma, severe stage: Secondary | ICD-10-CM | POA: Diagnosis not present

## 2015-08-06 ENCOUNTER — Encounter: Payer: Self-pay | Admitting: *Deleted

## 2015-08-06 ENCOUNTER — Encounter: Payer: Self-pay | Admitting: Family Medicine

## 2015-08-06 ENCOUNTER — Ambulatory Visit (INDEPENDENT_AMBULATORY_CARE_PROVIDER_SITE_OTHER): Payer: Medicare PPO | Admitting: Family Medicine

## 2015-08-06 VITALS — BP 140/66 | HR 64 | Temp 97.8°F | Wt 175.5 lb

## 2015-08-06 DIAGNOSIS — Z23 Encounter for immunization: Secondary | ICD-10-CM | POA: Diagnosis not present

## 2015-08-06 DIAGNOSIS — E782 Mixed hyperlipidemia: Secondary | ICD-10-CM

## 2015-08-06 DIAGNOSIS — M8580 Other specified disorders of bone density and structure, unspecified site: Secondary | ICD-10-CM | POA: Diagnosis not present

## 2015-08-06 DIAGNOSIS — E119 Type 2 diabetes mellitus without complications: Secondary | ICD-10-CM | POA: Diagnosis not present

## 2015-08-06 DIAGNOSIS — E538 Deficiency of other specified B group vitamins: Secondary | ICD-10-CM

## 2015-08-06 LAB — LIPID PANEL
Cholesterol: 168 mg/dL (ref 0–200)
HDL: 38.6 mg/dL — AB (ref >39.00)
NonHDL: 129.02
Total CHOL/HDL Ratio: 4
Triglycerides: 228 mg/dL — ABNORMAL HIGH (ref 0.0–149.0)
VLDL: 45.6 mg/dL — AB (ref 0.0–40.0)

## 2015-08-06 LAB — VITAMIN B12: Vitamin B-12: 1011 pg/mL — ABNORMAL HIGH (ref 211–911)

## 2015-08-06 LAB — HEMOGLOBIN A1C: HEMOGLOBIN A1C: 7.1 % — AB (ref 4.6–6.5)

## 2015-08-06 LAB — LDL CHOLESTEROL, DIRECT: Direct LDL: 93 mg/dL

## 2015-08-06 NOTE — Progress Notes (Signed)
Pre visit review using our clinic review tool, if applicable. No additional management support is needed unless otherwise documented below in the visit note. 

## 2015-08-06 NOTE — Progress Notes (Signed)
Subjective:   Patient ID: [name] [name], female    DOB: 1932-01-28, 81 y.o.   MRN: [id number]  [name] [name] is a pleasant 81 y.o. year old female who presents to clinic today with Hip Pain and Results  on 08/06/2015  HPI: Here to discuss bone density results.  Low T score (femoral) - 1.7.  Bone density in spine normal.  No recent falls or fractures.  Stays very active despite OA/trochanteric bursitis that has been bothering her lately.  Does not like dairy.  She does take Vit D 1000 IU daily.  Current Outpatient Prescriptions on File Prior to Visit  Medication Sig Dispense Refill  . aspirin (ASPIRIN LOW DOSE) 81 MG tablet Take 81 mg by mouth daily.      . beta carotene w/minerals (OCUVITE) tablet Take 1 tablet by mouth daily.    . cetirizine (ZYRTEC) 10 MG tablet Take 10 mg by mouth daily as needed for allergies.     . Cholecalciferol (VITAMIN D-3) 1000 UNITS CAPS Take 1 capsule by mouth daily.    . COMBIGAN 0.2-0.5 % ophthalmic solution 1 drop every 12 (twelve) hours.     . CRANBERRY PO Take 1 tablet by mouth daily.    Marland Kitchen ezetimibe (ZETIA) 10 MG tablet Take 1 tablet (10 mg total) by mouth daily. 30 tablet 11  . glipiZIDE (GLUCOTROL) 5 MG tablet TAKE ONE TABLET BY MOUTH TWICE DAILY 60 tablet 0  . isosorbide mononitrate (IMDUR) 30 MG 24 hr tablet Take 0.5 tablets (15 mg total) by mouth daily. 15 tablet 11  . lansoprazole (PREVACID) 15 MG capsule TAKE ONE CAPSULE BY MOUTH EVERY DAY 30 capsule 5  . losartan (COZAAR) 50 MG tablet TAKE ONE TABLET BY MOUTH EVERY DAY 30 tablet 11  . metoprolol succinate (TOPROL-XL) 100 MG 24 hr tablet TAKE ONE TABLET BY MOUTH EVERY MORNING 30 tablet 3  . MICROLET LANCETS MISC Use with bayer contour meter test blood sugar twice daily 100 each 5  . nitroGLYCERIN (NITROSTAT) 0.4 MG SL tablet Place 1 tablet (0.4 mg total) under the tongue every 5 (five) minutes as needed for chest pain. 30 tablet 3  . TRUETEST TEST test strip TEST TWICE DAILY 100 each 2  .  vitamin B-12 (CYANOCOBALAMIN) 500 MCG tablet Take 500 mcg by mouth daily.    . vitamin C (ASCORBIC ACID) 500 MG tablet Take 500 mg by mouth daily.    . [DISCONTINUED] loratadine (CLARITIN) 10 MG tablet Take 10 mg by mouth as needed.      No current facility-administered medications on file prior to visit.    Allergies  Allergen Reactions  . Shellfish Allergy Anaphylaxis and Rash  . Cephalexin     REACTION: trash and swelling REACTION: trash and swelling  . Ciprofloxacin     REACTION: u/k  . Clopidogrel Bisulfate     REACTION: swelling, rash  . Codeine Phosphate     REACTION: unspecified  . Ezetimibe-Simvastatin     REACTION: myalgias REACTION: myalgias  . Hydrocod Polst-Cpm Polst Er     REACTION: u/k  . Nitrofurantoin     REACTION: [place] REACTION: [place]  . Pregabalin     REACTION: felt bad REACTION: felt bad  . Propoxyphene N-Acetaminophen     REACTION: [place]  . Rofecoxib     unk unk   . Sulfa Antibiotics     REACTION: unspecified  . Sulfamethoxazole     REACTION: unspecified  . Sulfonamide Derivatives     REACTION:  unspecified  . Celecoxib Rash    REACTION: [place]  . Codeine Rash    REACTION: [place]  . Hydrocod Polst-Cpm Polst Er Rash  . Other Rash  . Propoxyphene Rash    Past Medical History  Diagnosis Date  . GERD (gastroesophageal reflux disease)   . CAD (coronary artery disease)     a. s/p tandem Promus DES to LAD in 2009 by Dr. Olevia Perches;  b.  LHC (03/22/14):  prox LAD 30%, mid LAD 40%, LAD stent ok with dist 20% ISR, apical LAD occluded with L-L collats filling apical vessel (too small for PCI), mid RCA 20%, EF 70%.  Med Rx  . Urticaria   . Hyperlipidemia     intol of statins  . Hypertension   . Allergy     Cipro, Plavix, Statins  . Personal history of colonic polyps   . Diabetes mellitus     Diagnosed 2012  . NHL (non-Hodgkin's lymphoma) dx'd 2003    chemo/xrt comp 2003  . Proctitis   . Diverticulosis of colon (without mention of hemorrhage)   . Hx of  echocardiogram     a. Echo (07/2013):  Mild LVH, vigorous LVF, EF 65-70%, Gr 1 DD, mild MR, mild to mod LAE  . Hx of cardiovascular stress test     a. Nuclear (08/2013):  No ischemia, EF 80%, Normal    Past Surgical History  Procedure Laterality Date  . Vaginal hysterectomy      partial, fibroids, one ovary left  . Knee surgery  10/2003    left  . Coronary angioplasty with stent placement      x 2  . Dexa-neg    . Cardiac cath-neg    . Laser surgery for cataracts-left    . Stress cardiolite    . Left heart catheterization with coronary angiogram N/A 03/22/2014    Procedure: LEFT HEART CATHETERIZATION WITH CORONARY ANGIOGRAM;  Surgeon: Burnell Blanks, MD;  Location: [facility] CATH LAB;  Service: Cardiovascular;  Laterality: N/A;    Family History  Problem Relation Age of Onset  . Cancer Mother     jaw  . Hypertension Father   . Heart attack Father   . Colon cancer Neg Hx   . Heart attack Sister     Social History   Social History  . Marital Status: Married    Spouse Name: N/A  . Number of Children: N/A  . Years of Education: N/A   Occupational History  . Retired    Social History Main Topics  . Smoking status: Never Smoker   . Smokeless tobacco: Never Used  . Alcohol Use: No  . Drug Use: No  . Sexual Activity: Not on file   Other Topics Concern  . Not on file   Social History Narrative   The PMH, PSH, Social History, Family History, Medications, and allergies have been reviewed in [facility], and have been updated if relevant.     Review of Systems  Constitutional: Negative.   Respiratory: Negative.   Cardiovascular: Negative.   Genitourinary: Negative.   Musculoskeletal: Positive for arthralgias. Negative for gait problem.  Skin: Negative.   Neurological: Negative.   Psychiatric/Behavioral: Negative.   All other systems reviewed and are negative.      Objective:    BP 140/66 mmHg  Pulse 64  Temp(Src) 97.8 F (36.6 C) (Oral)  Wt 175 lb 8 oz  (79.606 kg)  SpO2 96%   Physical Exam  Constitutional: She is oriented to person, place,  and time. She appears well-developed and well-nourished. No distress.  HENT:  Head: Normocephalic.  Eyes: Conjunctivae are normal.  Cardiovascular: Normal rate.   Pulmonary/Chest: Effort normal.  Musculoskeletal: Normal range of motion. She exhibits no edema.  Neurological: She is alert and oriented to person, place, and time. No cranial nerve deficit.  Skin: Skin is warm and dry.  Psychiatric: She has a normal mood and affect. Her behavior is normal. Judgment and thought content normal.          Assessment & Plan:   Osteopenia No Follow-up on file.

## 2015-08-06 NOTE — Assessment & Plan Note (Signed)
>  15 minutes spent in face to face time with patient, >50% spent in counselling or coordination of care. Discussed non dairy sources of Calcium- see AVS. Continue vitamin d intake in diet and weight bearing exercise.  Follow up bone density in 2 years.

## 2015-08-06 NOTE — Patient Instructions (Signed)
  Try to get most or all of your calcium from your food--aim for 1200 mg/day for women over 50 and men over 70.  To figure out dietary calcium: 300 mg/day from all non dairy foods plus 300 mg per cup of milk, other dairy, or fortified juice.  Non dairy foods that contain calcium:  Kale, oranges, sardines, oatmeal, soy milk/soybeans, salmon, white beans, dried figs, turnip greens, almonds, broccoli, tofu.  

## 2015-08-14 ENCOUNTER — Ambulatory Visit (INDEPENDENT_AMBULATORY_CARE_PROVIDER_SITE_OTHER): Payer: Medicare PPO | Admitting: Family Medicine

## 2015-08-14 ENCOUNTER — Encounter: Payer: Self-pay | Admitting: Family Medicine

## 2015-08-14 VITALS — BP 120/64 | HR 66 | Temp 98.2°F | Ht 67.0 in | Wt 177.5 lb

## 2015-08-14 DIAGNOSIS — M7062 Trochanteric bursitis, left hip: Secondary | ICD-10-CM

## 2015-08-14 DIAGNOSIS — M25552 Pain in left hip: Secondary | ICD-10-CM

## 2015-08-14 MED ORDER — METHYLPREDNISOLONE ACETATE 40 MG/ML IJ SUSP
80.0000 mg | Freq: Once | INTRAMUSCULAR | Status: AC
  Administered 2015-08-14: 80 mg via INTRA_ARTICULAR

## 2015-08-14 NOTE — Progress Notes (Signed)
Dr. Frederico Hamman T. Sueko Dimichele, MD, [facility] [place], [address] Phone: [phone] Fax: [phone]  08/14/2015  Patient: [name] [name], MRN: [id number], DOB: 1932-05-05, 81 y.o.  Primary Physician:  Arnette Norris, MD  Chief Complaint: Hip Pain  Subjective:   [name] [name] is a 81 y.o. very pleasant female patient who presents with the following:  Left hip pain:  Wakes up in the morning and feels like she is getting stuck by a knife, and standing up and cooking it will hurt really had and ache. In church will stand up and down.   This is all lateral in the left hip, and a slightly different location than on previous episodes.  She is not having any groin pain.  No back pain.  No radicular symptoms.  Past Medical History, Surgical History, Social History, Family History, Problem List, Medications, and Allergies have been reviewed and updated if relevant.  Patient Active Problem List   Diagnosis Date Noted  . Osteopenia 08/06/2015  . Constipation 12/30/2014  . RUQ pain 12/30/2014  . B12 deficiency 12/30/2014  . Allergic reaction 09/02/2014  . Arthralgia of right foot 07/18/2014  . Pruritic intertrigo 07/18/2014  . Right hip pain 06/04/2014  . Other malaise and fatigue 06/04/2014  . Elevated LFTs 04/19/2014  . Mitral regurgitation 04/19/2014  . Burning sensation of feet 06/13/2013  . Pes planus 06/13/2013  . Left hip pain 10/02/2012  . Renal insufficiency 06/07/2011  . Diabetes mellitus with renal manifestations, controlled 05/31/2011  . MIXED HYPERLIPIDEMIA 08/04/2010  . BEN HTN HEART DISEASE WITHOUT HEART FAIL 08/04/2010  . LUMBAR SPRAIN AND STRAIN 01/15/2010  . VERTIGO 10/29/2009  . OSTEOPENIA 04/29/2009  . 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 02/25/2009  . EXTERNAL HEMORRHOIDS 02/24/2009  . DIVERTICULOSIS, COLON 02/24/2009  . HEMOPTYSIS 09/02/2008  . BACK PAIN, THORACIC REGION 04/30/2008  .  LYMPHOMA, MALT 08/28/2007  . DETACHED RETINA 08/28/2007  . LABILE HYPERTENSION 08/28/2007  . CORONARY ARTERY DISEASE s/p DES x 2 to LAD in 2009 08/28/2007  . MITRAL VALVE PROLAPSE 08/28/2007  . GERD 08/28/2007  . IBS 08/28/2007  . BREAST CYSTS, BILATERAL 08/28/2007  . LOW BACK PAIN, CHRONIC 06/29/2007    Past Medical History  Diagnosis Date  . GERD (gastroesophageal reflux disease)   . CAD (coronary artery disease)     a. s/p tandem Promus DES to LAD in 2009 by Dr. Olevia Perches;  b.  LHC (03/22/14):  prox LAD 30%, mid LAD 40%, LAD stent ok with dist 20% ISR, apical LAD occluded with L-L collats filling apical vessel (too small for PCI), mid RCA 20%, EF 70%.  Med Rx  . Urticaria   . Hyperlipidemia     intol of statins  . Hypertension   . Allergy     Cipro, Plavix, Statins  . Personal history of colonic polyps   . Diabetes mellitus     Diagnosed 2012  . NHL (non-Hodgkin's lymphoma) dx'd 2003    chemo/xrt comp 2003  . Proctitis   . Diverticulosis of colon (without mention of hemorrhage)   . Hx of echocardiogram     a. Echo (07/2013):  Mild LVH, vigorous LVF, EF 65-70%, Gr 1 DD, mild MR, mild to mod LAE  . Hx of cardiovascular stress test     a. Nuclear (08/2013):  No ischemia, EF 80%, Normal    Past Surgical History  Procedure Laterality Date  . Vaginal hysterectomy  partial, fibroids, one ovary left  . Knee surgery  10/2003    left  . Coronary angioplasty with stent placement      x 2  . Dexa-neg    . Cardiac cath-neg    . Laser surgery for cataracts-left    . Stress cardiolite    . Left heart catheterization with coronary angiogram N/A 03/22/2014    Procedure: LEFT HEART CATHETERIZATION WITH CORONARY ANGIOGRAM;  Surgeon: Burnell Blanks, MD;  Location: [facility] CATH LAB;  Service: Cardiovascular;  Laterality: N/A;    Social History   Social History  . Marital Status: Married    Spouse Name: N/A  . Number of Children: N/A  . Years of Education: N/A   Occupational  History  . Retired    Social History Main Topics  . Smoking status: Never Smoker   . Smokeless tobacco: Never Used  . Alcohol Use: No  . Drug Use: No  . Sexual Activity: Not on file   Other Topics Concern  . Not on file   Social History Narrative    Family History  Problem Relation Age of Onset  . Cancer Mother     jaw  . Hypertension Father   . Heart attack Father   . Colon cancer Neg Hx   . Heart attack Sister     Allergies  Allergen Reactions  . Shellfish Allergy Anaphylaxis and Rash  . Cephalexin     REACTION: trash and swelling REACTION: trash and swelling  . Ciprofloxacin     REACTION: u/k  . Clopidogrel Bisulfate     REACTION: swelling, rash  . Codeine Phosphate     REACTION: unspecified  . Ezetimibe-Simvastatin     REACTION: myalgias REACTION: myalgias  . Hydrocod Polst-Cpm Polst Er     REACTION: u/k  . Nitrofurantoin     REACTION: [place] REACTION: [place]  . Pregabalin     REACTION: felt bad REACTION: felt bad  . Propoxyphene N-Acetaminophen     REACTION: [place]  . Rofecoxib     unk unk   . Sulfa Antibiotics     REACTION: unspecified  . Sulfamethoxazole     REACTION: unspecified  . Sulfonamide Derivatives     REACTION: unspecified  . Celecoxib Rash    REACTION: [place]  . Codeine Rash    REACTION: [place]  . Hydrocod Polst-Cpm Polst Er Rash  . Other Rash  . Propoxyphene Rash    Medication list reviewed and updated in full in [place].  GEN: No fevers, chills. Nontoxic. Primarily MSK c/o today. MSK: Detailed in the HPI GI: tolerating PO intake without difficulty Neuro: No numbness, parasthesias, or tingling associated. Otherwise the pertinent positives of the ROS are noted above.   Objective:   BP 120/64 mmHg  Pulse 66  Temp(Src) 98.2 F (36.8 C) (Oral)  Ht 5\' 7"  (1.702 m)  Wt 177 lb 8 oz (80.513 kg)  BMI 27.79 kg/m2   GEN: WDWN, NAD, Non-toxic, Alert & Oriented x 3 HEENT: Atraumatic, Normocephalic.  Ears and Nose: No external  deformity. EXTR: No clubbing/cyanosis/edema NEURO: Normal gait.  PSYCH: Normally interactive. Conversant. Not depressed or anxious appearing.  Calm demeanor.   HIP EXAM: SIDE: l ROM: Abduction, Flexion, Internal and External range of motion: mostly preserved Pain with terminal IROM and EROM: minor  GTB: TTP but more tender at the lesser troch bursa SLR: NEG Knees: No effusion FABER: NT REVERSE FABER: NT, neg Piriformis: NT at direct palpation Str: flexion: 5/5 abduction: 4/5  on the L adduction: 5/5 Strength testing non-tender     Radiology: No results found.  Assessment and Plan:   Trochanteric bursitis of left hip  Left hip pain - Plan: methylPREDNISolone acetate (DEPO-MEDROL) injection 80 mg   This time the patient seems to have more pain at her lesser trochanteric bursa, with a milder degree at the trochanteric bursa at the greater trochanter.  She still remains weak in her hip abductors, but to try to give her some pain relief we'll inject both the lesser and greater trochanteric bursa.  Trochanteric Bursitis Injection, L Verbal consent obtained. Risks (including infection, potential atrophy), benefits, and alternatives reviewed. Greater trochanter sterilely prepped with Chloraprep. Ethyl Chloride used for anesthesia. 8 cc of Lidocaine 1% injected with Depo-Medrol 80 mg into trochanteric bursa at area of maximal tenderness at lesser trochanter and 1/2 of medication injected into greater trochateric bursa. Needle taken to bone to troch bursa, flows easily. Bursa massaged. No bleeding and no complications. Decreased pain after injection. Needle: 22 gauge spinal needle   Follow-up: prn  Patient Instructions  Hip Rehab:  Hip Abductions: Lying on side, straight out to side. 3 sets, work up to being able to do #30 with each set.  At the beginning you may only be able to do a lot less, try to do #10.   hip abductor rotations. standing, hip flexion and rotation outward then  inward. 3 sets, 15 reps. when can do comfortably, add ankle weights starting at 2 pounds.   cross over stretching - shoulder back to ground, same side leg crossover. 3-5 sets for 30 min..   IT Band stretch, 3 sets, 5-10 seconds     Signed,  Crystalann Korf T. Selen Smucker, MD   Patient's Medications  New Prescriptions   No medications on file  Previous Medications   ASPIRIN (ASPIRIN LOW DOSE) 81 MG TABLET    Take 81 mg by mouth daily.     BETA CAROTENE W/MINERALS (OCUVITE) TABLET    Take 1 tablet by mouth daily.   CETIRIZINE (ZYRTEC) 10 MG TABLET    Take 10 mg by mouth daily as needed for allergies.    CHOLECALCIFEROL (VITAMIN D-3) 1000 UNITS CAPS    Take 1 capsule by mouth daily.   COMBIGAN 0.2-0.5 % OPHTHALMIC SOLUTION    1 drop every 12 (twelve) hours.    CRANBERRY PO    Take 1 tablet by mouth daily.   EZETIMIBE (ZETIA) 10 MG TABLET    Take 1 tablet (10 mg total) by mouth daily.   GLIPIZIDE (GLUCOTROL) 5 MG TABLET    TAKE ONE TABLET BY MOUTH TWICE DAILY   ISOSORBIDE MONONITRATE (IMDUR) 30 MG 24 HR TABLET    Take 0.5 tablets (15 mg total) by mouth daily.   LANSOPRAZOLE (PREVACID) 15 MG CAPSULE    TAKE ONE CAPSULE BY MOUTH EVERY DAY   LATANOPROST (XALATAN) 0.005 % OPHTHALMIC SOLUTION       LOSARTAN (COZAAR) 50 MG TABLET    TAKE ONE TABLET BY MOUTH EVERY DAY   METOPROLOL SUCCINATE (TOPROL-XL) 100 MG 24 HR TABLET    TAKE ONE TABLET BY MOUTH EVERY MORNING   MICROLET LANCETS MISC    Use with bayer contour meter test blood sugar twice daily   NITROGLYCERIN (NITROSTAT) 0.4 MG SL TABLET    Place 1 tablet (0.4 mg total) under the tongue every 5 (five) minutes as needed for chest pain.   TRUETEST TEST TEST STRIP    TEST TWICE DAILY  VITAMIN B-12 (CYANOCOBALAMIN) 500 MCG TABLET    Take 500 mcg by mouth daily.   VITAMIN C (ASCORBIC ACID) 500 MG TABLET    Take 500 mg by mouth daily.  Modified Medications   No medications on file  Discontinued Medications   No medications on file

## 2015-08-14 NOTE — Progress Notes (Signed)
Pre visit review using our clinic review tool, if applicable. No additional management support is needed unless otherwise documented below in the visit note. 

## 2015-08-14 NOTE — Patient Instructions (Signed)
Hip Rehab:  Hip Abductions: Lying on side, straight out to side. 3 sets, work up to being able to do #30 with each set.  At the beginning you may only be able to do a lot less, try to do #10.   hip abductor rotations. standing, hip flexion and rotation outward then inward. 3 sets, 15 reps. when can do comfortably, add ankle weights starting at 2 pounds.   cross over stretching - shoulder back to ground, same side leg crossover. 3-5 sets for 30 min..   IT Band stretch, 3 sets, 5-10 seconds

## 2015-09-04 ENCOUNTER — Ambulatory Visit: Payer: Self-pay | Admitting: Family Medicine

## 2015-10-08 ENCOUNTER — Encounter: Payer: Self-pay | Admitting: Family Medicine

## 2015-10-08 ENCOUNTER — Ambulatory Visit (INDEPENDENT_AMBULATORY_CARE_PROVIDER_SITE_OTHER): Payer: Medicare PPO | Admitting: Family Medicine

## 2015-10-08 VITALS — BP 112/66 | HR 66 | Temp 97.7°F | Wt 178.0 lb

## 2015-10-08 DIAGNOSIS — M25552 Pain in left hip: Secondary | ICD-10-CM | POA: Diagnosis not present

## 2015-10-08 MED ORDER — TRAMADOL HCL 50 MG PO TBDP: ORAL_TABLET | ORAL | Status: DC

## 2015-10-08 MED ORDER — LIDOCAINE 5 % EX PTCH: 1.0000 | MEDICATED_PATCH | CUTANEOUS | Status: DC

## 2015-10-08 NOTE — Assessment & Plan Note (Signed)
Deteriorated. >25 minutes spent in face to face time with patient, >50% spent in counselling or coordination of care Multiple drug allergies. Voltaren gel would be a good option but she developed a rash with celebrex. Advised scheduling tylenol twice daily, eRx sent for lidocaine patches and given for tramadol for severe pain. If pain persists, she will schedule follow up with Dr. [name].

## 2015-10-08 NOTE — Patient Instructions (Signed)
Good to see you.  We are starting lidocaine patch for your hip. Ok to take Tylenol twice daily and tramadol as needed for SEVERE pain.  Colace and Miralax as needed for constipation.

## 2015-10-08 NOTE — Progress Notes (Signed)
Subjective:   Patient ID: [name] [name], female    DOB: November 10, 1932, 81 y.o.   MRN: [id number]  [name] [name] is a pleasant 81 y.o. year old female who presents to clinic today with Hip Pain  on 10/08/2015  HPI:  Has been followed by Dr. [name] for trochanteric bursitis, most recently on 08/14/15.  Note reviewed.  Did receive left trochanteric bursa steroid injection. Also given exercises to do.  First hip injection that she received helped a lot.  The one in September didn't last as long.  Not sleeping well- cant get comfortable. Standing is very painful too.  Pain goes down "fleshy part of thigh" but not to groin.  Never warm to touch.  Tylenol hasnt helped.  Taking a lot of Ibuprofen. Current Outpatient Prescriptions on File Prior to Visit  Medication Sig Dispense Refill  . aspirin (ASPIRIN LOW DOSE) 81 MG tablet Take 81 mg by mouth daily.      . beta carotene w/minerals (OCUVITE) tablet Take 1 tablet by mouth daily.    . cetirizine (ZYRTEC) 10 MG tablet Take 10 mg by mouth daily as needed for allergies.     . Cholecalciferol (VITAMIN D-3) 1000 UNITS CAPS Take 1 capsule by mouth daily.    . COMBIGAN 0.2-0.5 % ophthalmic solution 1 drop every 12 (twelve) hours.     . CRANBERRY PO Take 1 tablet by mouth daily.    Marland Kitchen ezetimibe (ZETIA) 10 MG tablet Take 1 tablet (10 mg total) by mouth daily. 30 tablet 11  . glipiZIDE (GLUCOTROL) 5 MG tablet TAKE ONE TABLET BY MOUTH TWICE DAILY 60 tablet 0  . isosorbide mononitrate (IMDUR) 30 MG 24 hr tablet Take 0.5 tablets (15 mg total) by mouth daily. 15 tablet 11  . lansoprazole (PREVACID) 15 MG capsule TAKE ONE CAPSULE BY MOUTH EVERY DAY 30 capsule 5  . latanoprost (XALATAN) 0.005 % ophthalmic solution     . losartan (COZAAR) 50 MG tablet TAKE ONE TABLET BY MOUTH EVERY DAY 30 tablet 11  . metoprolol succinate (TOPROL-XL) 100 MG 24 hr tablet TAKE ONE TABLET BY MOUTH EVERY MORNING 30 tablet 3  . MICROLET LANCETS MISC Use with bayer contour meter  test blood sugar twice daily 100 each 5  . nitroGLYCERIN (NITROSTAT) 0.4 MG SL tablet Place 1 tablet (0.4 mg total) under the tongue every 5 (five) minutes as needed for chest pain. 30 tablet 3  . TRUETEST TEST test strip TEST TWICE DAILY 100 each 2  . vitamin B-12 (CYANOCOBALAMIN) 500 MCG tablet Take 500 mcg by mouth daily.    . vitamin C (ASCORBIC ACID) 500 MG tablet Take 500 mg by mouth daily.    . [DISCONTINUED] loratadine (CLARITIN) 10 MG tablet Take 10 mg by mouth as needed.      No current facility-administered medications on file prior to visit.    Allergies  Allergen Reactions  . Shellfish Allergy Anaphylaxis and Rash  . Cephalexin     REACTION: trash and swelling REACTION: trash and swelling  . Ciprofloxacin     REACTION: u/k  . Clopidogrel Bisulfate     REACTION: swelling, rash  . Codeine Phosphate     REACTION: unspecified  . Ezetimibe-Simvastatin     REACTION: myalgias REACTION: myalgias  . Hydrocod Polst-Cpm Polst Er     REACTION: u/k  . Nitrofurantoin     REACTION: [place] REACTION: [place]  . Pregabalin     REACTION: felt bad REACTION: felt bad  . Propoxyphene N-Acetaminophen  REACTION: [place]  . Rofecoxib     unk unk   . Sulfa Antibiotics     REACTION: unspecified  . Sulfamethoxazole     REACTION: unspecified  . Sulfonamide Derivatives     REACTION: unspecified  . Celecoxib Rash    REACTION: [place]  . Codeine Rash    REACTION: [place]  . Hydrocod Polst-Cpm Polst Er Rash  . Other Rash  . Propoxyphene Rash    Past Medical History  Diagnosis Date  . GERD (gastroesophageal reflux disease)   . CAD (coronary artery disease)     a. s/p tandem Promus DES to LAD in 2009 by Dr. Olevia Perches;  b.  LHC (03/22/14):  prox LAD 30%, mid LAD 40%, LAD stent ok with dist 20% ISR, apical LAD occluded with L-L collats filling apical vessel (too small for PCI), mid RCA 20%, EF 70%.  Med Rx  . Urticaria   . Hyperlipidemia     intol of statins  . Hypertension   . Allergy     Cipro,  Plavix, Statins  . Personal history of colonic polyps   . Diabetes mellitus     Diagnosed 2012  . NHL (non-Hodgkin's lymphoma) ([place]) dx'd 2003    chemo/xrt comp 2003  . Proctitis   . Diverticulosis of colon (without mention of hemorrhage)   . Hx of echocardiogram     a. Echo (07/2013):  Mild LVH, vigorous LVF, EF 65-70%, Gr 1 DD, mild MR, mild to mod LAE  . Hx of cardiovascular stress test     a. Nuclear (08/2013):  No ischemia, EF 80%, Normal    Past Surgical History  Procedure Laterality Date  . Vaginal hysterectomy      partial, fibroids, one ovary left  . Knee surgery  10/2003    left  . Coronary angioplasty with stent placement      x 2  . Dexa-neg    . Cardiac cath-neg    . Laser surgery for cataracts-left    . Stress cardiolite    . Left heart catheterization with coronary angiogram N/A 03/22/2014    Procedure: LEFT HEART CATHETERIZATION WITH CORONARY ANGIOGRAM;  Surgeon: Burnell Blanks, MD;  Location: [facility] CATH LAB;  Service: Cardiovascular;  Laterality: N/A;    Family History  Problem Relation Age of Onset  . Cancer Mother     jaw  . Hypertension Father   . Heart attack Father   . Colon cancer Neg Hx   . Heart attack Sister     Social History   Social History  . Marital Status: Married    Spouse Name: N/A  . Number of Children: N/A  . Years of Education: N/A   Occupational History  . Retired    Social History Main Topics  . Smoking status: Never Smoker   . Smokeless tobacco: Never Used  . Alcohol Use: No  . Drug Use: No  . Sexual Activity: Not on file   Other Topics Concern  . Not on file   Social History Narrative   The PMH, PSH, Social History, Family History, Medications, and allergies have been reviewed in [facility], and have been updated if relevant.  Review of Systems  Constitutional: Negative.   Cardiovascular: Negative.   Gastrointestinal: Negative.   Musculoskeletal: Positive for arthralgias. Negative for myalgias, back pain, joint  swelling, gait problem, neck pain and neck stiffness.  Skin: Negative.   Neurological: Negative.   Psychiatric/Behavioral: Negative.   All other systems reviewed and are negative.  Objective:    BP 112/66 mmHg  Pulse 66  Temp(Src) 97.7 F (36.5 C) (Oral)  Wt 178 lb (80.74 kg)  SpO2 95%   Physical Exam  Constitutional: She is oriented to person, place, and time. She appears well-developed and well-nourished. No distress.  HENT:  Head: Normocephalic.  Eyes: Conjunctivae are normal.  Cardiovascular: Normal rate.   Pulmonary/Chest: Effort normal.  Musculoskeletal:       Left hip: She exhibits tenderness. She exhibits normal range of motion, no swelling, no crepitus, no deformity and no laceration.  Neurological: She is alert and oriented to person, place, and time. No cranial nerve deficit.  Skin: Skin is warm and dry.  Psychiatric: She has a normal mood and affect. Her behavior is normal. Thought content normal.          Assessment & Plan:   Left hip pain No Follow-up on file.

## 2015-10-08 NOTE — Progress Notes (Signed)
Pre visit review using our clinic review tool, if applicable. No additional management support is needed unless otherwise documented below in the visit note. 

## 2015-10-10 ENCOUNTER — Telehealth: Payer: Self-pay | Admitting: *Deleted

## 2015-10-10 NOTE — Telephone Encounter (Signed)
Form received indicating PA required for pts lidocaine patch. Form completed and placed in Dr Hulen Shouts inbox for review and sig

## 2015-10-13 NOTE — Telephone Encounter (Signed)
Form completed and faxed back for approval

## 2015-10-15 NOTE — Telephone Encounter (Signed)
Attempted to contact pt. VM not set up. Coverage for pts lidocaine patches have been denied. Per fax, medication from pts formulary list must be prescribed

## 2015-10-17 ENCOUNTER — Other Ambulatory Visit: Payer: Self-pay | Admitting: Family Medicine

## 2015-10-21 ENCOUNTER — Other Ambulatory Visit: Payer: Self-pay | Admitting: Family Medicine

## 2015-11-13 ENCOUNTER — Encounter: Payer: Self-pay | Admitting: Primary Care

## 2015-11-13 ENCOUNTER — Ambulatory Visit (INDEPENDENT_AMBULATORY_CARE_PROVIDER_SITE_OTHER): Payer: Medicare PPO | Admitting: Primary Care

## 2015-11-13 VITALS — BP 132/68 | HR 63 | Temp 97.5°F | Ht 67.0 in | Wt 174.0 lb

## 2015-11-13 DIAGNOSIS — L509 Urticaria, unspecified: Secondary | ICD-10-CM | POA: Diagnosis not present

## 2015-11-13 MED ORDER — PREDNISONE 10 MG PO TABS: ORAL_TABLET | ORAL | Status: DC

## 2015-11-13 MED ORDER — METHYLPREDNISOLONE ACETATE 80 MG/ML IJ SUSP
80.0000 mg | Freq: Once | INTRAMUSCULAR | Status: AC
  Administered 2015-11-13: 80 mg via INTRAMUSCULAR

## 2015-11-13 NOTE — Progress Notes (Signed)
Subjective:    Patient ID: [name] [name], female    DOB: August 22, 1932, 81 y.o.   MRN: [id number]  HPI [name] [name] is an 81 year old female who presents today with a chief complaint of hives. Her hives are present to bilateral lower and upper extremities and are moderately itchy in nature.   She has a history of chronic hip and back pain so she  purchased OTC lidoncaine 4% patches for her pain. She placed the patch on her left hip Tuesday night and removed it Wednesday morning. She first noticed a few whelps Wednesday morning, but as the day went on her whelps progressed and are now to her extremities. She took a benadryl tablet last night with minor improvement in itching, however rash continues to progress.  Denies sensations of scratchy throat, inflammation of throat, tingling to lips, shortness of breath, wheezing, chest pain.   Review of Systems  Constitutional: Negative for fever and chills.  HENT: Negative for sore throat and trouble swallowing.   Respiratory: Negative for shortness of breath and wheezing.   Cardiovascular: Negative for chest pain.  Skin: Positive for rash.       Past Medical History  Diagnosis Date  . GERD (gastroesophageal reflux disease)   . CAD (coronary artery disease)     a. s/p tandem Promus DES to LAD in 2009 by Dr. Olevia Perches;  b.  LHC (03/22/14):  prox LAD 30%, mid LAD 40%, LAD stent ok with dist 20% ISR, apical LAD occluded with L-L collats filling apical vessel (too small for PCI), mid RCA 20%, EF 70%.  Med Rx  . Urticaria   . Hyperlipidemia     intol of statins  . Hypertension   . Allergy     Cipro, Plavix, Statins  . Personal history of colonic polyps   . Diabetes mellitus     Diagnosed 2012  . NHL (non-Hodgkin's lymphoma) ([place]) dx'd 2003    chemo/xrt comp 2003  . Proctitis   . Diverticulosis of colon (without mention of hemorrhage)   . Hx of echocardiogram     a. Echo (07/2013):  Mild LVH, vigorous LVF, EF 65-70%, Gr 1 DD, mild MR, mild to mod LAE   . Hx of cardiovascular stress test     a. Nuclear (08/2013):  No ischemia, EF 80%, Normal    Social History   Social History  . Marital Status: Married    Spouse Name: N/A  . Number of Children: N/A  . Years of Education: N/A   Occupational History  . Retired    Social History Main Topics  . Smoking status: Never Smoker   . Smokeless tobacco: Never Used  . Alcohol Use: No  . Drug Use: No  . Sexual Activity: Not on file   Other Topics Concern  . Not on file   Social History Narrative    Past Surgical History  Procedure Laterality Date  . Vaginal hysterectomy      partial, fibroids, one ovary left  . Knee surgery  10/2003    left  . Coronary angioplasty with stent placement      x 2  . Dexa-neg    . Cardiac cath-neg    . Laser surgery for cataracts-left    . Stress cardiolite    . Left heart catheterization with coronary angiogram N/A 03/22/2014    Procedure: LEFT HEART CATHETERIZATION WITH CORONARY ANGIOGRAM;  Surgeon: Burnell Blanks, MD;  Location: [facility] CATH LAB;  Service: Cardiovascular;  Laterality:  N/A;    Family History  Problem Relation Age of Onset  . Cancer Mother     jaw  . Hypertension Father   . Heart attack Father   . Colon cancer Neg Hx   . Heart attack Sister     Allergies  Allergen Reactions  . Shellfish Allergy Anaphylaxis and Rash  . Cephalexin     REACTION: trash and swelling REACTION: trash and swelling  . Ciprofloxacin     REACTION: u/k  . Clopidogrel Bisulfate     REACTION: swelling, rash  . Codeine Phosphate     REACTION: unspecified  . Ezetimibe-Simvastatin     REACTION: myalgias REACTION: myalgias  . Hydrocod Polst-Cpm Polst Er     REACTION: u/k  . Lidocaine Hives  . Nitrofurantoin     REACTION: [place] REACTION: [place]  . Pregabalin     REACTION: felt bad REACTION: felt bad  . Propoxyphene N-Acetaminophen     REACTION: [place]  . Rofecoxib     unk unk   . Sulfa Antibiotics     REACTION: unspecified  .  Sulfamethoxazole     REACTION: unspecified  . Sulfonamide Derivatives     REACTION: unspecified  . Celecoxib Rash    REACTION: [place]  . Codeine Rash    REACTION: [place]  . Hydrocod Polst-Cpm Polst Er Rash  . Other Rash  . Propoxyphene Rash    Current Outpatient Prescriptions on File Prior to Visit  Medication Sig Dispense Refill  . aspirin (ASPIRIN LOW DOSE) 81 MG tablet Take 81 mg by mouth daily.      . beta carotene w/minerals (OCUVITE) tablet Take 1 tablet by mouth daily.    . cetirizine (ZYRTEC) 10 MG tablet Take 10 mg by mouth daily as needed for allergies.     . Cholecalciferol (VITAMIN D-3) 1000 UNITS CAPS Take 1 capsule by mouth daily.    . COMBIGAN 0.2-0.5 % ophthalmic solution 1 drop every 12 (twelve) hours.     . CRANBERRY PO Take 1 tablet by mouth daily.    Marland Kitchen ezetimibe (ZETIA) 10 MG tablet Take 1 tablet (10 mg total) by mouth daily. 30 tablet 11  . glipiZIDE (GLUCOTROL) 5 MG tablet TAKE ONE TABLET BY MOUTH TWICE DAILY 60 tablet 0  . isosorbide mononitrate (IMDUR) 30 MG 24 hr tablet Take 0.5 tablets (15 mg total) by mouth daily. 15 tablet 11  . lansoprazole (PREVACID) 15 MG capsule TAKE ONE CAPSULE BY MOUTH EVERY DAY 30 capsule 11  . latanoprost (XALATAN) 0.005 % ophthalmic solution     . losartan (COZAAR) 50 MG tablet TAKE ONE TABLET BY MOUTH EVERY DAY 30 tablet 11  . metoprolol succinate (TOPROL-XL) 100 MG 24 hr tablet TAKE ONE TABLET BY MOUTH EVERY MORNING 30 tablet 3  . MICROLET LANCETS MISC Use with bayer contour meter test blood sugar twice daily 100 each 5  . nitroGLYCERIN (NITROSTAT) 0.4 MG SL tablet Place 1 tablet (0.4 mg total) under the tongue every 5 (five) minutes as needed for chest pain. 30 tablet 3  . TRUETEST TEST test strip TEST TWICE DAILY 100 each 2  . vitamin B-12 (CYANOCOBALAMIN) 500 MCG tablet Take 500 mcg by mouth daily.    . vitamin C (ASCORBIC ACID) 500 MG tablet Take 500 mg by mouth daily.    Marland Kitchen lidocaine (LIDODERM) 5 % Place 1 patch onto the skin daily.  Remove & Discard patch within 12 hours or as directed by MD (Patient not taking: Reported on 11/13/2015) 30  patch 0  . TraMADol HCl 50 MG TBDP 1 tab by mouth three times daily as needed for severe pain (Patient not taking: Reported on 11/13/2015) 30 tablet 0  . [DISCONTINUED] loratadine (CLARITIN) 10 MG tablet Take 10 mg by mouth as needed.      No current facility-administered medications on file prior to visit.    BP 132/68 mmHg  Pulse 63  Temp(Src) 97.5 F (36.4 C) (Oral)  Ht 5\' 7"  (1.702 m)  Wt 174 lb (78.926 kg)  BMI 27.25 kg/m2  SpO2 97%    Objective:   Physical Exam  Constitutional: She appears well-nourished.  HENT:  Mouth/Throat: Oropharynx is clear and moist.  Neck: Neck supple.  Cardiovascular: Normal rate and regular rhythm.   Pulmonary/Chest: Effort normal and breath sounds normal.  Skin: Skin is warm and dry.  Hives located to bilateral upper and lower extremities. During exam whelps popped up to small portion of anterior chest wall.          Assessment & Plan:  Hives:  Applied OTC lidocaine 4% patch Tuesday night, woke up Wednesday morning with large whelps, progressed since. Itchy. Denies wheezing, throat swelling, tingling to oral mucosa. Moderate sized whelps to bilateral upper and lower extremities. Treat with IM depo medrol 80 mg today. RX for prednisone taper to start tomorrow. Added to allergy list. ED precautions provided, return precautions provided.

## 2015-11-13 NOTE — Patient Instructions (Signed)
You've been provided with a shot of steroids for your hives.  Start prednisone tablets on Friday December 16th. Take 3 tablets daily for 3 days, then 2 tablets for 3 days, then 1 tablet for 3 days.  Please call me if no improvement in the next 1-2 days.  It was a pleasure meeting you!  Hives Hives are itchy, red, swollen areas of the skin. They can vary in size and location on your body. Hives can come and go for hours or several days (acute hives) or for several weeks (chronic hives). Hives do not spread from person to person (noncontagious). They may get worse with scratching, exercise, and emotional stress. CAUSES   Allergic reaction to food, additives, or drugs.  Infections, including the common cold.  Illness, such as vasculitis, lupus, or thyroid disease.  Exposure to sunlight, heat, or cold.  Exercise.  Stress.  Contact with chemicals. SYMPTOMS   Red or white swollen patches on the skin. The patches may change size, shape, and location quickly and repeatedly.  Itching.  Swelling of the hands, feet, and face. This may occur if hives develop deeper in the skin. DIAGNOSIS  Your caregiver can usually tell what is wrong by performing a physical exam. Skin or blood tests may also be done to determine the cause of your hives. In some cases, the cause cannot be determined. TREATMENT  Mild cases usually get better with medicines such as antihistamines. Severe cases may require an emergency epinephrine injection. If the cause of your hives is known, treatment includes avoiding that trigger.  HOME CARE INSTRUCTIONS   Avoid causes that trigger your hives.  Take antihistamines as directed by your caregiver to reduce the severity of your hives. Non-sedating or low-sedating antihistamines are usually recommended. Do not drive while taking an antihistamine.  Take any other medicines prescribed for itching as directed by your caregiver.  Wear loose-fitting clothing.  Keep all  follow-up appointments as directed by your caregiver. SEEK MEDICAL CARE IF:   You have persistent or severe itching that is not relieved with medicine.  You have painful or swollen joints. SEEK IMMEDIATE MEDICAL CARE IF:   You have a fever.  Your tongue or lips are swollen.  You have trouble breathing or swallowing.  You feel tightness in the throat or chest.  You have abdominal pain. These problems may be the first sign of a life-threatening allergic reaction. Call your local emergency services (911 in [place] MAKE SURE YOU:   Understand these instructions.  Will watch your condition.  Will get help right away if you are not doing well or get worse.   This information is not intended to replace advice given to you by your health care provider. Make sure you discuss any questions you have with your health care provider.   Document Released: 11/15/2005 Document Revised: 11/20/2013 Document Reviewed: 02/08/2012 Elsevier Interactive Patient Education [employer].

## 2015-11-13 NOTE — Progress Notes (Signed)
Pre visit review using our clinic review tool, if applicable. No additional management support is needed unless otherwise documented below in the visit note. 

## 2015-11-17 ENCOUNTER — Encounter: Payer: Self-pay | Admitting: Family Medicine

## 2015-11-17 ENCOUNTER — Ambulatory Visit (INDEPENDENT_AMBULATORY_CARE_PROVIDER_SITE_OTHER): Payer: Medicare PPO | Admitting: Family Medicine

## 2015-11-17 VITALS — BP 120/70 | HR 63 | Temp 97.7°F | Ht 67.0 in | Wt 174.0 lb

## 2015-11-17 DIAGNOSIS — L500 Allergic urticaria: Secondary | ICD-10-CM | POA: Diagnosis not present

## 2015-11-17 DIAGNOSIS — T7840XD Allergy, unspecified, subsequent encounter: Secondary | ICD-10-CM

## 2015-11-17 MED ORDER — PREDNISONE 20 MG PO TABS: ORAL_TABLET | ORAL | Status: DC

## 2015-11-17 NOTE — Patient Instructions (Signed)
Allergic Reaction: You have had an allergic reaction to something. Much of the time it is difficult to identify exactly what it is. If we have been able to identify your trigger, then that is great and be sure to avoid it in the future.   Antihistamines:  Zyrtec 10 mg, 1 tablet in the morning (Less sedating) You can take an additional Benadryl 25 mg tablet at night. (1-2 tablets before bed)  Cimetidine 400 mg, 1 tablet 3 times a day This is a stomach acid blocker (H2 blocker), but it also has antihistamine properties and works in a completely different way that Zyrtec or Benadryl.   These are all over the counter, and if you cannot find them, then the pharmacy staff would be happy to help you find the right medicine.   

## 2015-11-17 NOTE — Progress Notes (Signed)
Pre visit review using our clinic review tool, if applicable. No additional management support is needed unless otherwise documented below in the visit note. 

## 2015-11-17 NOTE — Progress Notes (Signed)
Dr. Frederico Hamman T. Walda Hertzog, MD, [facility] [place], [address] Phone: [phone] Fax: [phone]  11/17/2015  Patient: [name] [name], MRN: [id number], DOB: 1932/06/01, 81 y.o.  Primary Physician:  Arnette Norris, MD  Chief Complaint: Urticaria  Subjective:   [name] [name] is a 81 y.o. very pleasant female patient who presents with the following:  Allergic reaction and urticaria.  Slept with a lidocaine patch - took a benadryl. Woke up this morning scratching for dear life.   Seen last 11/13/2015 and given an IM steroid shot and prednisone 30 mg x 3, 20 mg x 3, 10 mg x 3. Taking rare benadryl only on top of this highly allergic.   20 things on her allergy list. Has always tolerated injectable lidocaine.  08/14/2015 Last OV with Owens Loffler, MD  Left hip pain:  Wakes up in the morning and feels like she is getting stuck by a knife, and standing up and cooking it will hurt really had and ache. In church will stand up and down.   This is all lateral in the left hip, and a slightly different location than on previous episodes.  She is not having any groin pain.  No back pain.  No radicular symptoms.  Past Medical History, Surgical History, Social History, Family History, Problem List, Medications, and Allergies have been reviewed and updated if relevant.  Patient Active Problem List   Diagnosis Date Noted  . Osteopenia 08/06/2015  . Constipation 12/30/2014  . RUQ pain 12/30/2014  . B12 deficiency 12/30/2014  . Allergic reaction 09/02/2014  . Arthralgia of right foot 07/18/2014  . Pruritic intertrigo 07/18/2014  . Right hip pain 06/04/2014  . Other malaise and fatigue 06/04/2014  . Elevated LFTs 04/19/2014  . Mitral regurgitation 04/19/2014  . Burning sensation of feet 06/13/2013  . Pes planus 06/13/2013  . Left hip pain 10/02/2012  . Renal insufficiency 06/07/2011  . Diabetes mellitus with renal manifestations,  controlled ([place]) 05/31/2011  . MIXED HYPERLIPIDEMIA 08/04/2010  . BEN HTN HEART DISEASE WITHOUT HEART FAIL 08/04/2010  . LUMBAR SPRAIN AND STRAIN 01/15/2010  . VERTIGO 10/29/2009  . OSTEOPENIA 04/29/2009  . 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 02/25/2009  . EXTERNAL HEMORRHOIDS 02/24/2009  . DIVERTICULOSIS, COLON 02/24/2009  . HEMOPTYSIS 09/02/2008  . BACK PAIN, THORACIC REGION 04/30/2008  . LYMPHOMA, MALT 08/28/2007  . DETACHED RETINA 08/28/2007  . LABILE HYPERTENSION 08/28/2007  . CORONARY ARTERY DISEASE s/p DES x 2 to LAD in 2009 08/28/2007  . MITRAL VALVE PROLAPSE 08/28/2007  . GERD 08/28/2007  . IBS 08/28/2007  . BREAST CYSTS, BILATERAL 08/28/2007  . LOW BACK PAIN, CHRONIC 06/29/2007    Past Medical History  Diagnosis Date  . GERD (gastroesophageal reflux disease)   . CAD (coronary artery disease)     a. s/p tandem Promus DES to LAD in 2009 by Dr. Olevia Perches;  b.  LHC (03/22/14):  prox LAD 30%, mid LAD 40%, LAD stent ok with dist 20% ISR, apical LAD occluded with L-L collats filling apical vessel (too small for PCI), mid RCA 20%, EF 70%.  Med Rx  . Urticaria   . Hyperlipidemia     intol of statins  . Hypertension   . Allergy     Cipro, Plavix, Statins  . Personal history of colonic polyps   . Diabetes mellitus     Diagnosed 2012  . NHL (non-Hodgkin's lymphoma) ([place]) dx'd 2003    chemo/xrt  comp 2003  . Proctitis   . Diverticulosis of colon (without mention of hemorrhage)   . Hx of echocardiogram     a. Echo (07/2013):  Mild LVH, vigorous LVF, EF 65-70%, Gr 1 DD, mild MR, mild to mod LAE  . Hx of cardiovascular stress test     a. Nuclear (08/2013):  No ischemia, EF 80%, Normal    Past Surgical History  Procedure Laterality Date  . Vaginal hysterectomy      partial, fibroids, one ovary left  . Knee surgery  10/2003    left  . Coronary angioplasty with stent placement      x 2  . Dexa-neg    . Cardiac cath-neg    . Laser surgery for cataracts-left    .  Stress cardiolite    . Left heart catheterization with coronary angiogram N/A 03/22/2014    Procedure: LEFT HEART CATHETERIZATION WITH CORONARY ANGIOGRAM;  Surgeon: Burnell Blanks, MD;  Location: [facility] CATH LAB;  Service: Cardiovascular;  Laterality: N/A;    Social History   Social History  . Marital Status: Married    Spouse Name: N/A  . Number of Children: N/A  . Years of Education: N/A   Occupational History  . Retired    Social History Main Topics  . Smoking status: Never Smoker   . Smokeless tobacco: Never Used  . Alcohol Use: No  . Drug Use: No  . Sexual Activity: Not on file   Other Topics Concern  . Not on file   Social History Narrative    Family History  Problem Relation Age of Onset  . Cancer Mother     jaw  . Hypertension Father   . Heart attack Father   . Colon cancer Neg Hx   . Heart attack Sister     Allergies  Allergen Reactions  . Shellfish Allergy Anaphylaxis and Rash  . Cephalexin     REACTION: trash and swelling REACTION: trash and swelling  . Ciprofloxacin     REACTION: u/k  . Clopidogrel Bisulfate     REACTION: swelling, rash  . Codeine Phosphate     REACTION: unspecified  . Ezetimibe-Simvastatin     REACTION: myalgias REACTION: myalgias  . Hydrocod Polst-Cpm Polst Er     REACTION: u/k  . Lidocaine Hives  . Nitrofurantoin     REACTION: [place] REACTION: [place]  . Pregabalin     REACTION: felt bad REACTION: felt bad  . Propoxyphene N-Acetaminophen     REACTION: [place]  . Rofecoxib     unk unk   . Sulfa Antibiotics     REACTION: unspecified  . Sulfamethoxazole     REACTION: unspecified  . Sulfonamide Derivatives     REACTION: unspecified  . Celecoxib Rash    REACTION: [place]  . Codeine Rash    REACTION: [place]  . Hydrocod Polst-Cpm Polst Er Rash  . Other Rash  . Propoxyphene Rash    Medication list reviewed and updated in full in [place].  GEN: No fevers, chills. Nontoxic. Primarily MSK c/o today. MSK: Detailed in the  HPI GI: tolerating PO intake without difficulty Neuro: No numbness, parasthesias, or tingling associated. Otherwise the pertinent positives of the ROS are noted above.   Objective:   BP 120/70 mmHg  Pulse 63  Temp(Src) 97.7 F (36.5 C) (Oral)  Ht 5\' 7"  (1.702 m)  Wt 174 lb (78.926 kg)  BMI 27.25 kg/m2   GEN: WDWN, NAD, Non-toxic, A & O x 3  HEENT: Atraumatic, Normocephalic. Neck supple. No masses, No LAD. Ears and Nose: No external deformity. CV: RRR, No M/G/R. No JVD. No thrill. No extra heart sounds. PULM: CTA B, no wheezes, crackles, rhonchi. No retractions. No resp. distress. No accessory muscle use. EXTR: No c/c/e NEURO Normal gait.  PSYCH: Normally interactive. Conversant. Not depressed or anxious appearing.  Calm demeanor.   SKIN: scattered hives   Radiology: No results found.  Assessment and Plan:   Allergic urticaria  Allergic reaction, subsequent encounter  Likely reaction to something in patch not lidocaine, but treat systemic reaction.  H1 and H2 blockers with steroids.   Patient Instructions  Allergic Reaction: You have had an allergic reaction to something. Much of the time it is difficult to identify exactly what it is. If we have been able to identify your trigger, then that is great and be sure to avoid it in the future.   Antihistamines:  Zyrtec 10 mg, 1 tablet in the morning (Less sedating) You can take an additional Benadryl 25 mg tablet at night. (1-2 tablets before bed)  Cimetidine 400 mg, 1 tablet 3 times a day This is a stomach acid blocker (H2 blocker), but it also has antihistamine properties and works in a completely different way that Zyrtec or Benadryl.   These are all over the counter, and if you cannot find them, then the pharmacy staff would be happy to help you find the right medicine.       New Prescriptions   PREDNISONE (DELTASONE) 20 MG TABLET    2 tabs a day for 3 days, then 1 tab a day for 3 days   Modified Medications   No  medications on file   No orders of the defined types were placed in this encounter.    Signed,  Maud Deed. Jjesus Dingley, MD   Patient's Medications  New Prescriptions   PREDNISONE (DELTASONE) 20 MG TABLET    2 tabs a day for 3 days, then 1 tab a day for 3 days  Previous Medications   ASPIRIN (ASPIRIN LOW DOSE) 81 MG TABLET    Take 81 mg by mouth daily.     BETA CAROTENE W/MINERALS (OCUVITE) TABLET    Take 1 tablet by mouth daily.   CETIRIZINE (ZYRTEC) 10 MG TABLET    Take 10 mg by mouth daily as needed for allergies.    CHOLECALCIFEROL (VITAMIN D-3) 1000 UNITS CAPS    Take 1 capsule by mouth daily.   COMBIGAN 0.2-0.5 % OPHTHALMIC SOLUTION    1 drop every 12 (twelve) hours.    CRANBERRY PO    Take 1 tablet by mouth daily.   EZETIMIBE (ZETIA) 10 MG TABLET    Take 1 tablet (10 mg total) by mouth daily.   GLIPIZIDE (GLUCOTROL) 5 MG TABLET    TAKE ONE TABLET BY MOUTH TWICE DAILY   ISOSORBIDE MONONITRATE (IMDUR) 30 MG 24 HR TABLET    Take 0.5 tablets (15 mg total) by mouth daily.   LANSOPRAZOLE (PREVACID) 15 MG CAPSULE    TAKE ONE CAPSULE BY MOUTH EVERY DAY   LATANOPROST (XALATAN) 0.005 % OPHTHALMIC SOLUTION       LIDOCAINE (LIDODERM) 5 %    Place 1 patch onto the skin daily. Remove & Discard patch within 12 hours or as directed by MD   LOSARTAN (COZAAR) 50 MG TABLET    TAKE ONE TABLET BY MOUTH EVERY DAY   METOPROLOL SUCCINATE (TOPROL-XL) 100 MG 24 HR TABLET    TAKE ONE  TABLET BY MOUTH EVERY MORNING   MICROLET LANCETS MISC    Use with bayer contour meter test blood sugar twice daily   NITROGLYCERIN (NITROSTAT) 0.4 MG SL TABLET    Place 1 tablet (0.4 mg total) under the tongue every 5 (five) minutes as needed for chest pain.   PREDNISONE (DELTASONE) 10 MG TABLET    Take 3 tablets daily for 3 days, then 2 tablets daily for 3 days, then 1 tablet daily for 3 days.   TRAMADOL HCL 50 MG TBDP    1 tab by mouth three times daily as needed for severe pain   TRIMETHOPRIM (TRIMPEX) 100 MG TABLET        TRUETEST TEST TEST STRIP    TEST TWICE DAILY   VITAMIN B-12 (CYANOCOBALAMIN) 500 MCG TABLET    Take 500 mcg by mouth daily.   VITAMIN C (ASCORBIC ACID) 500 MG TABLET    Take 500 mg by mouth daily.  Modified Medications   No medications on file  Discontinued Medications   No medications on file

## 2015-11-26 ENCOUNTER — Ambulatory Visit (INDEPENDENT_AMBULATORY_CARE_PROVIDER_SITE_OTHER): Payer: Medicare PPO | Admitting: Family Medicine

## 2015-11-26 ENCOUNTER — Encounter: Payer: Self-pay | Admitting: Family Medicine

## 2015-11-26 VITALS — BP 132/60 | HR 65 | Temp 98.2°F | Wt 173.5 lb

## 2015-11-26 DIAGNOSIS — L500 Allergic urticaria: Secondary | ICD-10-CM

## 2015-11-26 DIAGNOSIS — T7840XS Allergy, unspecified, sequela: Secondary | ICD-10-CM

## 2015-11-26 DIAGNOSIS — T7840XA Allergy, unspecified, initial encounter: Secondary | ICD-10-CM | POA: Diagnosis not present

## 2015-11-26 MED ORDER — METHYLPREDNISOLONE ACETATE 80 MG/ML IJ SUSP: 80.0000 mg | Freq: Once | INTRAMUSCULAR | Status: DC

## 2015-11-26 MED ORDER — METHYLPREDNISOLONE ACETATE 40 MG/ML IJ SUSP
80.0000 mg | Freq: Once | INTRAMUSCULAR | Status: AC
  Administered 2015-11-26: 80 mg via INTRAMUSCULAR

## 2015-11-26 NOTE — Progress Notes (Signed)
Pre visit review using our clinic review tool, if applicable. No additional management support is needed unless otherwise documented below in the visit note. 

## 2015-11-26 NOTE — Patient Instructions (Signed)
Great to see you. Happy New year! Please update me next week.

## 2015-11-26 NOTE — Assessment & Plan Note (Signed)
Deteriorated. Has been to allergist multiple times. Unclear what caused this flare of hives-? Possibly still component from lidocaine patch.  She insists she has not changed anything else or eaten any shellfish. She does not want to go back to allergist at this time. IM depo medrol 80 mg x 1 given today.  She will update me in a few days.  Perhaps a few more days of steroids will carry her over until the patch is out of her system. The patient indicates understanding of these issues and agrees with the plan.

## 2015-11-26 NOTE — Progress Notes (Signed)
Subjective:    Patient ID: [name] [name], female    DOB: [birth year], 81 y.o.   MRN: [id number]  Rash Pertinent negatives include no fever, shortness of breath or sore throat.   [name] [name] is an 81 year old female with multiple allergies, who presents today with a chief complaint of hives. Her hives are present to bilateral lower and upper extremities and are moderately itchy in nature.  This is her 3rd office visit in less than a month for this complaint.  Initially saw [name] Friendly, NP on 11/13/15- note reviewed-  She has a history of chronic hip and back pain so she  purchased OTC lidoncaine 4% patches for her pain. She placed the patch on her left hip Tuesday night and removed it Wednesday morning. She first noticed a few whelps Wednesday morning, but as the day went on her whelps progressed and are now to her extremities. She took a benadryl tablet last night with minor improvement in itching, however rash continues to progress.  Denies sensations of scratchy throat, inflammation of throat, tingling to lips, shortness of breath, wheezing, chest pain.  Treated with IM depo medrol 80 mg and give rx for prednisone taper to start at home the next day.  She then returned 4 days later to see Dr. Lorelei [name], on 12/19- note reviewed:  He treated her with the following:  Zyrtec 10 mg, 1 tablet in the morning (Less sedating) You can take an additional Benadryl 25 mg tablet at night. (1-2 tablets before bed)  Cimetidine 400 mg, 1 tablet 3 times a day This is a stomach acid blocker (H2 blocker), but it also has antihistamine properties and works in a completely different way that Zyrtec or Benadryl.   These are all over the counter, and if you cannot find them, then the pharmacy staff would be happy to help you find the right medicine.   Finished course of prednisone on christmas days.  Symptoms resolved until the next day and now she has all over hives again.  Took a bendaryl this morning but  tries not to as it "wipes her out."  Review of Systems  Constitutional: Negative for fever and chills.  HENT: Negative for sore throat and trouble swallowing.   Respiratory: Negative for shortness of breath and wheezing.   Cardiovascular: Negative for chest pain.  Skin: Positive for rash.       Past Medical History  Diagnosis Date  . GERD (gastroesophageal reflux disease)   . CAD (coronary artery disease)     a. s/p tandem Promus DES to LAD in 2009 by Dr. Olevia Perches;  b.  LHC (03/22/14):  prox LAD 30%, mid LAD 40%, LAD stent ok with dist 20% ISR, apical LAD occluded with L-L collats filling apical vessel (too small for PCI), mid RCA 20%, EF 70%.  Med Rx  . Urticaria   . Hyperlipidemia     intol of statins  . Hypertension   . Allergy     Cipro, Plavix, Statins  . Personal history of colonic polyps   . Diabetes mellitus     Diagnosed 2012  . NHL (non-Hodgkin's lymphoma) ([place]) dx'd 2003    chemo/xrt comp 2003  . Proctitis   . Diverticulosis of colon (without mention of hemorrhage)   . Hx of echocardiogram     a. Echo (07/2013):  Mild LVH, vigorous LVF, EF 65-70%, Gr 1 DD, mild MR, mild to mod LAE  . Hx of cardiovascular stress test  a. Nuclear (08/2013):  No ischemia, EF 80%, Normal    Social History   Social History  . Marital Status: Married    Spouse Name: N/A  . Number of Children: N/A  . Years of Education: N/A   Occupational History  . Retired    Social History Main Topics  . Smoking status: Never Smoker   . Smokeless tobacco: Never Used  . Alcohol Use: No  . Drug Use: No  . Sexual Activity: Not on file   Other Topics Concern  . Not on file   Social History Narrative    Past Surgical History  Procedure Laterality Date  . Vaginal hysterectomy      partial, fibroids, one ovary left  . Knee surgery  10/2003    left  . Coronary angioplasty with stent placement      x 2  . Dexa-neg    . Cardiac cath-neg    . Laser surgery for cataracts-left    . Stress  cardiolite    . Left heart catheterization with coronary angiogram N/A 03/22/2014    Procedure: LEFT HEART CATHETERIZATION WITH CORONARY ANGIOGRAM;  Surgeon: Burnell Blanks, MD;  Location: [facility] CATH LAB;  Service: Cardiovascular;  Laterality: N/A;    Family History  Problem Relation Age of Onset  . Cancer Mother     jaw  . Hypertension Father   . Heart attack Father   . Colon cancer Neg Hx   . Heart attack Sister     Allergies  Allergen Reactions  . Shellfish Allergy Anaphylaxis and Rash  . Cephalexin     REACTION: trash and swelling REACTION: trash and swelling  . Ciprofloxacin     REACTION: u/k  . Clopidogrel Bisulfate     REACTION: swelling, rash  . Codeine Phosphate     REACTION: unspecified  . Ezetimibe-Simvastatin     REACTION: myalgias REACTION: myalgias  . Hydrocod Polst-Cpm Polst Er     REACTION: u/k  . Lidocaine Hives    ONLY TO ADHESIVE LIDOCAINE PATCHES. NO PROBLEM WITH INJECTABLE LIDOCAINE.  Marland Kitchen Nitrofurantoin     REACTION: [place] REACTION: [place]  . Pregabalin     REACTION: felt bad REACTION: felt bad  . Propoxyphene N-Acetaminophen     REACTION: [place]  . Rofecoxib     unk unk   . Sulfa Antibiotics     REACTION: unspecified  . Sulfamethoxazole     REACTION: unspecified  . Sulfonamide Derivatives     REACTION: unspecified  . Celecoxib Rash    REACTION: [place]  . Codeine Rash    REACTION: [place]  . Hydrocod Polst-Cpm Polst Er Rash  . Other Rash  . Propoxyphene Rash    Current Outpatient Prescriptions on File Prior to Visit  Medication Sig Dispense Refill  . aspirin (ASPIRIN LOW DOSE) 81 MG tablet Take 81 mg by mouth daily.      . beta carotene w/minerals (OCUVITE) tablet Take 1 tablet by mouth daily.    . cetirizine (ZYRTEC) 10 MG tablet Take 10 mg by mouth daily as needed for allergies.     . Cholecalciferol (VITAMIN D-3) 1000 UNITS CAPS Take 1 capsule by mouth daily.    . COMBIGAN 0.2-0.5 % ophthalmic solution 1 drop every 12 (twelve) hours.     .  CRANBERRY PO Take 1 tablet by mouth daily.    Marland Kitchen ezetimibe (ZETIA) 10 MG tablet Take 1 tablet (10 mg total) by mouth daily. 30 tablet 11  . glipiZIDE (GLUCOTROL) 5 MG  tablet TAKE ONE TABLET BY MOUTH TWICE DAILY 60 tablet 0  . isosorbide mononitrate (IMDUR) 30 MG 24 hr tablet Take 0.5 tablets (15 mg total) by mouth daily. 15 tablet 11  . lansoprazole (PREVACID) 15 MG capsule TAKE ONE CAPSULE BY MOUTH EVERY DAY 30 capsule 11  . latanoprost (XALATAN) 0.005 % ophthalmic solution     . lidocaine (LIDODERM) 5 % Place 1 patch onto the skin daily. Remove & Discard patch within 12 hours or as directed by MD 30 patch 0  . losartan (COZAAR) 50 MG tablet TAKE ONE TABLET BY MOUTH EVERY DAY 30 tablet 11  . metoprolol succinate (TOPROL-XL) 100 MG 24 hr tablet TAKE ONE TABLET BY MOUTH EVERY MORNING 30 tablet 3  . MICROLET LANCETS MISC Use with bayer contour meter test blood sugar twice daily 100 each 5  . nitroGLYCERIN (NITROSTAT) 0.4 MG SL tablet Place 1 tablet (0.4 mg total) under the tongue every 5 (five) minutes as needed for chest pain. 30 tablet 3  . TraMADol HCl 50 MG TBDP 1 tab by mouth three times daily as needed for severe pain 30 tablet 0  . trimethoprim (TRIMPEX) 100 MG tablet     . TRUETEST TEST test strip TEST TWICE DAILY 100 each 2  . vitamin B-12 (CYANOCOBALAMIN) 500 MCG tablet Take 500 mcg by mouth daily.    . vitamin C (ASCORBIC ACID) 500 MG tablet Take 500 mg by mouth daily.    . [DISCONTINUED] loratadine (CLARITIN) 10 MG tablet Take 10 mg by mouth as needed.      No current facility-administered medications on file prior to visit.    BP 132/60 mmHg  Pulse 65  Temp(Src) 98.2 F (36.8 C) (Oral)  Wt 173 lb 8 oz (78.699 kg)  SpO2 96%    Objective:   Physical Exam  Constitutional: She appears well-nourished.  HENT:  Mouth/Throat: Oropharynx is clear and moist.  Neck: Neck supple.  Cardiovascular: Normal rate and regular rhythm.   Pulmonary/Chest: Effort normal and breath sounds  normal.  Skin: Skin is warm and dry.  Hives located to bilateral upper and lower extremities. During exam whelps popped up to small portion of anterior chest wall.  also has some hives now on her right hip        Assessment & Plan:

## 2015-12-02 ENCOUNTER — Other Ambulatory Visit: Payer: Self-pay | Admitting: Cardiovascular Disease

## 2015-12-02 LAB — HM DIABETES EYE EXAM

## 2015-12-03 ENCOUNTER — Other Ambulatory Visit: Payer: Self-pay | Admitting: Internal Medicine

## 2015-12-03 ENCOUNTER — Telehealth: Payer: Self-pay

## 2015-12-03 DIAGNOSIS — L500 Allergic urticaria: Secondary | ICD-10-CM

## 2015-12-03 MED ORDER — HYDROXYZINE HCL 10 MG PO TABS: 10.0000 mg | ORAL_TABLET | Freq: Three times a day (TID) | ORAL | Status: DC | PRN

## 2015-12-03 NOTE — Telephone Encounter (Signed)
Lm on pts vm and advised Rx sent to pharmacy. Pt advised to contact office should she have additional questions

## 2015-12-03 NOTE — Telephone Encounter (Signed)
Pt left v/m; pt was seen 11/26/15 with rash and pt still has hives and pt wants to get a rx for hydroxyzine 10 mg that Dr Caprice Red gave pt years ago that worked well. Pt request cb. [place]

## 2015-12-03 NOTE — Telephone Encounter (Signed)
We can try it short term. RX sent to pharmacy

## 2015-12-10 ENCOUNTER — Encounter: Payer: Self-pay | Admitting: Family Medicine

## 2015-12-26 ENCOUNTER — Other Ambulatory Visit: Payer: Self-pay | Admitting: Family Medicine

## 2015-12-26 NOTE — Telephone Encounter (Signed)
Last A1C abnormal. pls advise 

## 2016-01-09 ENCOUNTER — Ambulatory Visit (INDEPENDENT_AMBULATORY_CARE_PROVIDER_SITE_OTHER): Payer: Medicare Other | Admitting: Cardiovascular Disease

## 2016-01-09 ENCOUNTER — Encounter: Payer: Self-pay | Admitting: Cardiovascular Disease

## 2016-01-09 VITALS — Ht 67.0 in | Wt 174.0 lb

## 2016-01-09 DIAGNOSIS — I10 Essential (primary) hypertension: Secondary | ICD-10-CM | POA: Diagnosis not present

## 2016-01-09 DIAGNOSIS — I25119 Atherosclerotic heart disease of native coronary artery with unspecified angina pectoris: Secondary | ICD-10-CM

## 2016-01-09 DIAGNOSIS — I340 Nonrheumatic mitral (valve) insufficiency: Secondary | ICD-10-CM | POA: Diagnosis not present

## 2016-01-09 DIAGNOSIS — M79673 Pain in unspecified foot: Secondary | ICD-10-CM

## 2016-01-09 DIAGNOSIS — E785 Hyperlipidemia, unspecified: Secondary | ICD-10-CM

## 2016-01-09 MED ORDER — ISOSORBIDE MONONITRATE ER 30 MG PO TB24: 30.0000 mg | ORAL_TABLET | Freq: Every day | ORAL | Status: DC

## 2016-01-09 NOTE — Patient Instructions (Signed)
Medication Instructions:  Your physician has recommended you make the following change in your medication: Increase Imdur to 30 mg by mouth daily   Labwork: none  Testing/Procedures: Your physician has requested that you have an echocardiogram. Echocardiography is a painless test that uses sound waves to create images of your heart. It provides your doctor with information about the size and shape of your heart and how well your heart's chambers and valves are working. This procedure takes approximately one hour. There are no restrictions for this procedure.    Follow-Up: Your physician wants you to follow-up in: 12 months.  You will receive a reminder letter in the mail two months in advance. If you don't receive a letter, please call our office to schedule the follow-up appointment.   Any Other Special Instructions Will Be Listed Below (If Applicable).     If you need a refill on your cardiac medications before your next appointment, please call your pharmacy.

## 2016-01-09 NOTE — Progress Notes (Signed)
4  Chief Complaint  Patient presents with  . Follow-up      History of Present Illness: 82 yo female with history of CAD, HLD, HTN, non-Hodgkin's lymphoma, DM here today for cardiac follow up. She has been followed in the past by Dr. Olevia Perches. In 2009 she had tandem non-overlapping Promus drug-eluting stents placed in the LAD. She was evaluated in 2010 for chest pain and had a negative Myoview scan. She has been intolerant to statins and Plavix in the past. She has been on Zetia. Stress myoview 09/05/13 without ischemia. Echo 08/23/13 with normal LV size and function, mild MR. I saw her April 2015 and she c/o chest pain.Cardiac cath 03/22/14 with patent LAD stent, occluded apical with left to left collaterals and non-obstructive disease elsewhere. At her last visit here in 2016 she c/o bilateral foot pain, no calf pain. Lower extremity arterial dopplers were normal with normal ABI.   She is here for follow up. She is doing well. She has stable chest pains with exertion. No changes in frequency or character. No SOB. She continues to have bilateral foot pain.   Primary Care Physician: Arnette Norris  Past Medical History  Diagnosis Date  . GERD (gastroesophageal reflux disease)   . CAD (coronary artery disease)     a. s/p tandem Promus DES to LAD in 2009 by Dr. Olevia Perches;  b.  LHC (03/22/14):  prox LAD 30%, mid LAD 40%, LAD stent ok with dist 20% ISR, apical LAD occluded with L-L collats filling apical vessel (too small for PCI), mid RCA 20%, EF 70%.  Med Rx  . Urticaria   . Hyperlipidemia     intol of statins  . Hypertension   . Allergy     Cipro, Plavix, Statins  . Personal history of colonic polyps   . Diabetes mellitus     Diagnosed 2012  . NHL (non-Hodgkin's lymphoma) ([place]) dx'd 2003    chemo/xrt comp 2003  . Proctitis   . Diverticulosis of colon (without mention of hemorrhage)   . Hx of echocardiogram     a. Echo (07/2013):  Mild LVH, vigorous LVF, EF 65-70%, Gr 1 DD, mild MR, mild to mod LAE   . Hx of cardiovascular stress test     a. Nuclear (08/2013):  No ischemia, EF 80%, Normal    Past Surgical History  Procedure Laterality Date  . Vaginal hysterectomy      partial, fibroids, one ovary left  . Knee surgery  10/2003    left  . Coronary angioplasty with stent placement      x 2  . Dexa-neg    . Cardiac cath-neg    . Laser surgery for cataracts-left    . Stress cardiolite    . Left heart catheterization with coronary angiogram N/A 03/22/2014    Procedure: LEFT HEART CATHETERIZATION WITH CORONARY ANGIOGRAM;  Surgeon: Burnell Blanks, MD;  Location: [facility] CATH LAB;  Service: Cardiovascular;  Laterality: N/A;    Current Outpatient Prescriptions  Medication Sig Dispense Refill  . aspirin (ASPIRIN LOW DOSE) 81 MG tablet Take 81 mg by mouth daily.      . cetirizine (ZYRTEC) 10 MG tablet Take 10 mg by mouth daily as needed for allergies.     . Cholecalciferol (VITAMIN D-3) 1000 UNITS CAPS Take 1 capsule by mouth daily.    . COMBIGAN 0.2-0.5 % ophthalmic solution 1 drop every 12 (twelve) hours.     . CRANBERRY PO Take 1 tablet by mouth daily.    Marland Kitchen  ezetimibe (ZETIA) 10 MG tablet Take 1 tablet (10 mg total) by mouth daily. 30 tablet 11  . glipiZIDE (GLUCOTROL) 5 MG tablet TAKE ONE TABLET BY MOUTH TWICE DAILY 60 tablet 3  . hydrOXYzine (ATARAX/VISTARIL) 10 MG tablet Take 1 tablet (10 mg total) by mouth 3 (three) times daily as needed. 30 tablet 0  . isosorbide mononitrate (IMDUR) 30 MG 24 hr tablet Take 1 tablet (30 mg total) by mouth daily. 30 tablet 11  . lansoprazole (PREVACID) 15 MG capsule TAKE ONE CAPSULE BY MOUTH EVERY DAY 30 capsule 11  . latanoprost (XALATAN) 0.005 % ophthalmic solution     . lidocaine (LIDODERM) 5 % Place 1 patch onto the skin daily. Remove & Discard patch within 12 hours or as directed by MD 30 patch 0  . losartan (COZAAR) 50 MG tablet TAKE ONE TABLET BY MOUTH EVERY DAY 30 tablet 11  . metoprolol succinate (TOPROL-XL) 100 MG 24 hr tablet TAKE ONE  TABLET BY MOUTH EVERY MORNING 30 tablet 6  . MICROLET LANCETS MISC Use with bayer contour meter test blood sugar twice daily 100 each 5  . nitroGLYCERIN (NITROSTAT) 0.4 MG SL tablet Place 1 tablet (0.4 mg total) under the tongue every 5 (five) minutes as needed for chest pain. 30 tablet 3  . TraMADol HCl 50 MG TBDP 1 tab by mouth three times daily as needed for severe pain 30 tablet 0  . trimethoprim (TRIMPEX) 100 MG tablet     . TRUETEST TEST test strip TEST TWICE DAILY 100 each 2  . vitamin B-12 (CYANOCOBALAMIN) 500 MCG tablet Take 500 mcg by mouth daily.    . [DISCONTINUED] loratadine (CLARITIN) 10 MG tablet Take 10 mg by mouth as needed.      No current facility-administered medications for this visit.    Allergies  Allergen Reactions  . Shellfish Allergy Anaphylaxis and Rash  . Cephalexin     REACTION: trash and swelling REACTION: trash and swelling  . Ciprofloxacin     REACTION: u/k  . Clopidogrel Bisulfate     REACTION: swelling, rash  . Codeine Phosphate     REACTION: unspecified  . Ezetimibe-Simvastatin     REACTION: myalgias REACTION: myalgias  . Hydrocod Polst-Cpm Polst Er     REACTION: u/k  . Lidocaine Hives    ONLY TO ADHESIVE LIDOCAINE PATCHES. NO PROBLEM WITH INJECTABLE LIDOCAINE.  Marland Kitchen Nitrofurantoin     REACTION: [place] REACTION: [place]  . Pregabalin     REACTION: felt bad REACTION: felt bad  . Propoxyphene N-Acetaminophen     REACTION: [place]  . Rofecoxib     unk unk   . Sulfa Antibiotics     REACTION: unspecified  . Sulfamethoxazole     REACTION: unspecified  . Sulfonamide Derivatives     REACTION: unspecified  . Celecoxib Rash    REACTION: [place]  . Codeine Rash    REACTION: [place]  . Hydrocod Polst-Cpm Polst Er Rash  . Other Rash  . Propoxyphene Rash    Social History   Social History  . Marital Status: Married    Spouse Name: N/A  . Number of Children: N/A  . Years of Education: N/A   Occupational History  . Retired    Social History Main Topics  .  Smoking status: Never Smoker   . Smokeless tobacco: Never Used  . Alcohol Use: No  . Drug Use: No  . Sexual Activity: Not on file   Other Topics Concern  . Not on file  Social History Narrative    Family History  Problem Relation Age of Onset  . Cancer Mother     jaw  . Hypertension Father   . Heart attack Father   . Colon cancer Neg Hx   . Heart attack Sister     Review of Systems:  As stated in the HPI and otherwise negative.   Ht 5\' 7"  (1.702 m)  Wt 174 lb (78.926 kg)  BMI 27.25 kg/m2  Physical Examination: General: Well developed, well nourished, NAD HEENT: OP clear, mucus membranes moist SKIN: warm, dry. No rashes. Neuro: No focal deficits Musculoskeletal: Muscle strength 5/5 all ext Psychiatric: Mood and affect normal Neck: No JVD, no carotid bruits, no thyromegaly, no lymphadenopathy. Lungs:Clear bilaterally, no wheezes, rhonci, crackles Cardiovascular: Loletha Grayer, Regular rhythm. No murmurs, gallops or rubs. Abdomen:Soft. Bowel sounds present. Non-tender.  Extremities: No lower extremity edema. Pulses are 2 + in the bilateral DP/PT.  Exercise stress myoview: 09/05/13:  Stress Procedure: The patient exercised on the treadmill utilizing the Bruce Protocol for 7:06 minutes. The patient stopped due to sob, jaw pain, chest pressure and chest pain. Technetium 30m Sestamibi was injected at peak exercise and myocardial perfusion imaging was performed after a brief delay.  Stress ECG: No significant change from baseline ECG  QPS  Raw Data Images: Normal; no motion artifact; normal heart/lung ratio.  Stress Images: Normal homogeneous uptake in all areas of the myocardium.  Rest Images: Normal homogeneous uptake in all areas of the myocardium.  Subtraction (SDS): No evidence of ischemia.  Transient Ischemic Dilatation (Normal <1.22): N/A  Lung/Heart Ratio (Normal <0.45): 0.34  Quantitative Gated Spect Images  QGS EDV: 53 ml  QGS ESV: 11 ml  Impression  Exercise  Capacity: Lexiscan with low level exercise.  BP Response: Normal blood pressure response.  Clinical Symptoms: Significant chest pain.  ECG Impression: No significant ST segment change suggestive of ischemia.  Comparison with Prior Nuclear Study: No significant change from previous study  Overall Impression: Normal stress nuclear study. Patient had chest pain. No EKG changes. Normal perfusion. LV systolic function is vigorous.  LV Ejection Fraction: 80%. LV Wall Motion: NL LV Function; NL Wall Motion  Echo 08/23/13: Left ventricle: The cavity size was normal. There was mild concentric hypertrophy. Systolic function was vigorous. The estimated ejection fraction was in the range of 65% to 70%. Wall motion was normal; there were no regional wall motion abnormalities. Doppler parameters are consistent with abnormal left ventricular relaxation (grade 1 diastolic dysfunction). - Mitral valve: Mildly calcified annulus. Mild regurgitation. - Left atrium: The atrium was mildly to moderately dilated.  Cardiac cath 03/22/14: Left main: No obstructive disease.  Left Anterior Descending Artery: Large caliber vessel that courses to the apex. 30% proximal stenosis. 40% mid stenosis just prior to the stented segment. Mid stented segment patent without restenosis. Distal stented segment patent with 20% restenosis. Apical LAD becomes very small in caliber and has total occlusion with filling of apical vessel by left to left collaterals. (0.75 mm vessel).  Circumflex Artery: Large caliber vessel with several small OM branches. Diffuse luminal irregularities in the mid vessel.  Right Coronary Artery: Large dominant vessel with 20% mid stenosis.  Left Ventricular Angiogram: LVEF=70%.   EKG:  EKG is ordered today. The ekg ordered today demonstrates NSR, rate 81 bpm  Recent Labs: No results found for requested labs within last 365 days.   Lipid Panel    Component Value Date/Time   CHOL 168 08/06/2015  0847  TRIG 228.0* 08/06/2015 0847   HDL 38.60* 08/06/2015 0847   CHOLHDL 4 08/06/2015 0847   VLDL 45.6* 08/06/2015 0847   LDLCALC 103* 03/18/2014 0914   LDLDIRECT 93.0 08/06/2015 0847     Wt Readings from Last 3 Encounters:  01/09/16 174 lb (78.926 kg)  11/26/15 173 lb 8 oz (78.699 kg)  11/17/15 174 lb (78.926 kg)     Other studies Reviewed: Additional studies/ records that were reviewed today include: . Review of the above records demonstrates:    Assessment and Plan:   1. CORONARY ARTERY DISEASE: Stable. Continue Zetia, ASA, Toprol and Imdur. Will increase Imdur to 30 mg daily. She is intolerant to statins.   2. Hyperlipidemia: Lipids well controlled. Will continue Zetia. She is intolerant of statins.   3. HTN: BP controlled. Continue Cozaar and Toprol.   4. Mitral regurgitation: Mild by echo September 2014. Repeat echo now.   5. Bilateral foot pain: Lower extremity arterial dopplers are normal.   Current medicines are reviewed at length with the patient today.  The patient does not have concerns regarding medicines.  The following changes have been made:  no change  Labs/ tests ordered today include:   Orders Placed This Encounter  Procedures  . EKG 12-Lead  . Echocardiogram    Disposition:   FU with me in 12 months  Signed, Lauree Chandler, MD 01/09/2016 9:01 AM    [facility] [address], [place], [place]  [address] Phone: [phone]; Fax: [phone]

## 2016-01-14 ENCOUNTER — Ambulatory Visit (INDEPENDENT_AMBULATORY_CARE_PROVIDER_SITE_OTHER): Payer: Medicare Other | Admitting: Family Medicine

## 2016-01-14 ENCOUNTER — Encounter: Payer: Self-pay | Admitting: Family Medicine

## 2016-01-14 VITALS — BP 118/68 | HR 60 | Temp 97.9°F | Wt 172.0 lb

## 2016-01-14 DIAGNOSIS — I2510 Atherosclerotic heart disease of native coronary artery without angina pectoris: Secondary | ICD-10-CM | POA: Diagnosis not present

## 2016-01-14 DIAGNOSIS — M25571 Pain in right ankle and joints of right foot: Secondary | ICD-10-CM | POA: Insufficient documentation

## 2016-01-14 DIAGNOSIS — R300 Dysuria: Secondary | ICD-10-CM | POA: Diagnosis not present

## 2016-01-14 DIAGNOSIS — E0921 Drug or chemical induced diabetes mellitus with diabetic nephropathy: Secondary | ICD-10-CM

## 2016-01-14 DIAGNOSIS — M79671 Pain in right foot: Secondary | ICD-10-CM

## 2016-01-14 DIAGNOSIS — M79672 Pain in left foot: Secondary | ICD-10-CM

## 2016-01-14 DIAGNOSIS — M25572 Pain in left ankle and joints of left foot: Principal | ICD-10-CM

## 2016-01-14 LAB — COMPREHENSIVE METABOLIC PANEL
ALBUMIN: 4.3 g/dL (ref 3.5–5.2)
ALK PHOS: 54 U/L (ref 39–117)
ALT: 17 U/L (ref 0–35)
AST: 21 U/L (ref 0–37)
BUN: 28 mg/dL — AB (ref 6–23)
CALCIUM: 9.9 mg/dL (ref 8.4–10.5)
CHLORIDE: 102 meq/L (ref 96–112)
CO2: 32 mEq/L (ref 19–32)
Creatinine, Ser: 1.33 mg/dL — ABNORMAL HIGH (ref 0.40–1.20)
GFR: 40.6 mL/min — AB (ref >60.00)
Glucose, Bld: 122 mg/dL — ABNORMAL HIGH (ref 70–99)
POTASSIUM: 5.6 meq/L — AB (ref 3.5–5.1)
SODIUM: 139 meq/L (ref 135–145)
TOTAL PROTEIN: 7 g/dL (ref 6.0–8.3)
Total Bilirubin: 0.8 mg/dL (ref 0.2–1.2)

## 2016-01-14 LAB — LIPID PANEL
CHOLESTEROL: 216 mg/dL — AB (ref 0–200)
HDL: 52.3 mg/dL (ref >39.00)
LDL CALC: 133 mg/dL — AB (ref 0–99)
NonHDL: 163.43
TRIGLYCERIDES: 152 mg/dL — AB (ref 0.0–149.0)
Total CHOL/HDL Ratio: 4
VLDL: 30.4 mg/dL (ref 0.0–40.0)

## 2016-01-14 LAB — HEMOGLOBIN A1C: Hgb A1c MFr Bld: 7.2 % — ABNORMAL HIGH (ref 4.6–6.5)

## 2016-01-14 MED ORDER — GLUCOSE BLOOD VI STRP: 1.0000 | ORAL_STRIP | Freq: Two times a day (BID) | Status: DC

## 2016-01-14 MED ORDER — ONETOUCH VERIO FLEX SYSTEM W/DEVICE KIT: 1.0000 | PACK | Freq: Once | Status: DC

## 2016-01-14 NOTE — Assessment & Plan Note (Signed)
Due for labs today. 

## 2016-01-14 NOTE — Assessment & Plan Note (Signed)
New- consistent with plantar fascitis. Given and reviewed handout from sports med advisor with supportive care and exercises. Call or return to clinic prn if these symptoms worsen or fail to improve as anticipated. The patient indicates understanding of these issues and agrees with the plan.

## 2016-01-14 NOTE — Progress Notes (Signed)
Subjective:   Patient ID: [name] [name], female    DOB: 05-Nov-1932, 82 y.o.   MRN: [id number]  [name] [name] is a pleasant 82 y.o. year old female who presents to clinic today with Cramping in feet; Diabetes; and Dysuria  on 01/14/2016  HPI:  Saw cardiology  last week- increased Imdur 30 mg daily. Tolerating this ok.  This has helped with recurrent CP. She does have 2 echo scheduled for 01/20/16  Cramping in both feet/toes- at times radiates to above the ankle. First few steps in the morning are the worst.  Sometime tingling in her toes.  Diabetes- has been well controlled. On Glipizide 5 mg daily. Checks FSBS twice daily, doing very well with her diabetic diet. FSBS fasting was 122 this morning.  Lab Results  Component Value Date   HGBA1C 7.1* 08/06/2015   Lab Results  Component Value Date   CHOL 168 08/06/2015   HDL 38.60* 08/06/2015   LDLCALC 103* 03/18/2014   LDLDIRECT 93.0 08/06/2015   TRIG 228.0* 08/06/2015   CHOLHDL 4 08/06/2015    Dysuria- ongoing for past 2-3 days.  Denies hematuria.  No back pain.  No nausea, vomiting or fevers.  No hematuria. AZO has been helping with the burning.  Current Outpatient Prescriptions on File Prior to Visit  Medication Sig Dispense Refill  . aspirin (ASPIRIN LOW DOSE) 81 MG tablet Take 81 mg by mouth daily.      . cetirizine (ZYRTEC) 10 MG tablet Take 10 mg by mouth daily as needed for allergies.     . Cholecalciferol (VITAMIN D-3) 1000 UNITS CAPS Take 1 capsule by mouth daily.    . COMBIGAN 0.2-0.5 % ophthalmic solution 1 drop every 12 (twelve) hours.     . CRANBERRY PO Take 1 tablet by mouth daily.    Marland Kitchen ezetimibe (ZETIA) 10 MG tablet Take 1 tablet (10 mg total) by mouth daily. 30 tablet 11  . glipiZIDE (GLUCOTROL) 5 MG tablet TAKE ONE TABLET BY MOUTH TWICE DAILY 60 tablet 3  . hydrOXYzine (ATARAX/VISTARIL) 10 MG tablet Take 1 tablet (10 mg total) by mouth 3 (three) times daily as needed. 30 tablet 0  . isosorbide mononitrate  (IMDUR) 30 MG 24 hr tablet Take 1 tablet (30 mg total) by mouth daily. 30 tablet 11  . lansoprazole (PREVACID) 15 MG capsule TAKE ONE CAPSULE BY MOUTH EVERY DAY 30 capsule 11  . latanoprost (XALATAN) 0.005 % ophthalmic solution     . lidocaine (LIDODERM) 5 % Place 1 patch onto the skin daily. Remove & Discard patch within 12 hours or as directed by MD 30 patch 0  . losartan (COZAAR) 50 MG tablet TAKE ONE TABLET BY MOUTH EVERY DAY 30 tablet 11  . metoprolol succinate (TOPROL-XL) 100 MG 24 hr tablet TAKE ONE TABLET BY MOUTH EVERY MORNING 30 tablet 6  . nitroGLYCERIN (NITROSTAT) 0.4 MG SL tablet Place 1 tablet (0.4 mg total) under the tongue every 5 (five) minutes as needed for chest pain. 30 tablet 3  . TraMADol HCl 50 MG TBDP 1 tab by mouth three times daily as needed for severe pain 30 tablet 0  . trimethoprim (TRIMPEX) 100 MG tablet     . vitamin B-12 (CYANOCOBALAMIN) 500 MCG tablet Take 500 mcg by mouth daily.    . [DISCONTINUED] loratadine (CLARITIN) 10 MG tablet Take 10 mg by mouth as needed.      No current facility-administered medications on file prior to visit.    Allergies  Allergen Reactions  . Shellfish Allergy Anaphylaxis and Rash  . Cephalexin     REACTION: trash and swelling REACTION: trash and swelling  . Ciprofloxacin     REACTION: u/k  . Clopidogrel Bisulfate     REACTION: swelling, rash  . Codeine Phosphate     REACTION: unspecified  . Ezetimibe-Simvastatin     REACTION: myalgias REACTION: myalgias  . Hydrocod Polst-Cpm Polst Er     REACTION: u/k  . Lidocaine Hives    ONLY TO ADHESIVE LIDOCAINE PATCHES. NO PROBLEM WITH INJECTABLE LIDOCAINE.  Marland Kitchen Nitrofurantoin     REACTION: [place] REACTION: [place]  . Pregabalin     REACTION: felt bad REACTION: felt bad  . Propoxyphene N-Acetaminophen     REACTION: [place]  . Rofecoxib     unk unk   . Sulfa Antibiotics     REACTION: unspecified  . Sulfamethoxazole     REACTION: unspecified  . Sulfonamide Derivatives     REACTION:  unspecified  . Celecoxib Rash    REACTION: [place]  . Codeine Rash    REACTION: [place]  . Hydrocod Polst-Cpm Polst Er Rash  . Other Rash  . Propoxyphene Rash    Past Medical History  Diagnosis Date  . GERD (gastroesophageal reflux disease)   . CAD (coronary artery disease)     a. s/p tandem Promus DES to LAD in 2009 by Dr. Olevia Perches;  b.  LHC (03/22/14):  prox LAD 30%, mid LAD 40%, LAD stent ok with dist 20% ISR, apical LAD occluded with L-L collats filling apical vessel (too small for PCI), mid RCA 20%, EF 70%.  Med Rx  . Urticaria   . Hyperlipidemia     intol of statins  . Hypertension   . Allergy     Cipro, Plavix, Statins  . Personal history of colonic polyps   . Diabetes mellitus     Diagnosed 2012  . NHL (non-Hodgkin's lymphoma) ([place]) dx'd 2003    chemo/xrt comp 2003  . Proctitis   . Diverticulosis of colon (without mention of hemorrhage)   . Hx of echocardiogram     a. Echo (07/2013):  Mild LVH, vigorous LVF, EF 65-70%, Gr 1 DD, mild MR, mild to mod LAE  . Hx of cardiovascular stress test     a. Nuclear (08/2013):  No ischemia, EF 80%, Normal    Past Surgical History  Procedure Laterality Date  . Vaginal hysterectomy      partial, fibroids, one ovary left  . Knee surgery  10/2003    left  . Coronary angioplasty with stent placement      x 2  . Dexa-neg    . Cardiac cath-neg    . Laser surgery for cataracts-left    . Stress cardiolite    . Left heart catheterization with coronary angiogram N/A 03/22/2014    Procedure: LEFT HEART CATHETERIZATION WITH CORONARY ANGIOGRAM;  Surgeon: Burnell Blanks, MD;  Location: [facility] CATH LAB;  Service: Cardiovascular;  Laterality: N/A;    Family History  Problem Relation Age of Onset  . Cancer Mother     jaw  . Hypertension Father   . Heart attack Father   . Colon cancer Neg Hx   . Heart attack Sister     Social History   Social History  . Marital Status: Married    Spouse Name: N/A  . Number of Children: N/A  . Years of  Education: N/A   Occupational History  . Retired    Social History  Main Topics  . Smoking status: Never Smoker   . Smokeless tobacco: Never Used  . Alcohol Use: No  . Drug Use: No  . Sexual Activity: Not on file   Other Topics Concern  . Not on file   Social History Narrative   The PMH, PSH, Social History, Family History, Medications, and allergies have been reviewed in [facility], and have been updated if relevant.   Review of Systems  Constitutional: Negative.   Eyes: Negative.   Respiratory: Negative.   Endocrine: Negative.   Genitourinary: Positive for dysuria. Negative for frequency, hematuria, decreased urine volume, vaginal bleeding, vaginal discharge, genital sores, vaginal pain, menstrual problem and pelvic pain.  Musculoskeletal: Positive for arthralgias.  Allergic/Immunologic: Negative.   Neurological: Negative.   Hematological: Negative.   Psychiatric/Behavioral: Negative.   All other systems reviewed and are negative.      Objective:    BP 118/68 mmHg  Pulse 60  Temp(Src) 97.9 F (36.6 C) (Oral)  Wt 172 lb (78.019 kg)  SpO2 97%   Physical Exam   General:  Well-developed,well-nourished,in no acute distress; alert,appropriate and cooperative throughout examination Head:  normocephalic and atraumatic.   Eyes:  vision grossly intact, pupils equal, pupils round, and pupils reactive to light.   Ears:  R ear normal and L ear normal.   Nose:  no external deformity.   Mouth:  good dentition.   Neck:  No deformities, masses, or tenderness noted. Breasts:  No mass, nodules, thickening, tenderness, bulging, retraction, inflamation, nipple discharge or skin changes noted.   Lungs:  Normal respiratory effort, chest expands symmetrically. Lungs are clear to auscultation, no crackles or wheezes. Heart:  Normal rate and regular rhythm. S1 and S2 normal without gallop, murmur, click, rub or other extra sounds. Abdomen:  Bowel sounds positive,abdomen soft and non-tender  without masses, organomegaly or hernias noted. No suprapubic or CVA tenderness Msk:  TTP over right plantar fascia insertion   Extremities:  No clubbing, cyanosis, edema, or deformity noted with normal full range of motion of all joints.   Neurologic:  alert & oriented X3 and gait normal.   Skin:  Intact without suspicious lesions or rashes Psych:  Cognition and judgment appear intact. Alert and cooperative with normal attention span and concentration. No apparent delusions, illusions, hallucinations       Assessment & Plan:   Drug or chemical induced diabetes mellitus, controlled, with diabetic nephropathy, without long-term current use of insulin (HCC)  Dysuria No Follow-up on file.

## 2016-01-14 NOTE — Assessment & Plan Note (Signed)
Followed by cardiology 

## 2016-01-14 NOTE — Patient Instructions (Signed)
Great to see you. Your urine was normal.  . Frequency and burning may be due to bladder irritants... Drink water, avoid alcohol, caffeine, soda, citris, tomato, spicy foods.  Please keep me updated.  Try exercises given to you for plantar fascitis.  Let me know if this helps.  We will call you with your lab results and you can view them online.

## 2016-01-14 NOTE — Assessment & Plan Note (Signed)
New- UA neg.  Frequency may be due to bladder irritants... Drink water, avoid alcohol, caffeine, soda, citris, tomato, spicy foods. Call or return to clinic prn if these symptoms worsen or fail to improve as anticipated. The patient indicates understanding of these issues and agrees with the plan.

## 2016-01-14 NOTE — Progress Notes (Signed)
Pre visit review using our clinic review tool, if applicable. No additional management support is needed unless otherwise documented below in the visit note. 

## 2016-01-15 ENCOUNTER — Other Ambulatory Visit (INDEPENDENT_AMBULATORY_CARE_PROVIDER_SITE_OTHER): Payer: Medicare Other

## 2016-01-15 ENCOUNTER — Other Ambulatory Visit: Payer: Self-pay | Admitting: Family Medicine

## 2016-01-15 DIAGNOSIS — E875 Hyperkalemia: Secondary | ICD-10-CM

## 2016-01-15 LAB — BASIC METABOLIC PANEL
BUN: 28 mg/dL — AB (ref 6–23)
CHLORIDE: 102 meq/L (ref 96–112)
CO2: 31 meq/L (ref 19–32)
Calcium: 9.5 mg/dL (ref 8.4–10.5)
Creatinine, Ser: 1.34 mg/dL — ABNORMAL HIGH (ref 0.40–1.20)
GFR: 40.25 mL/min — ABNORMAL LOW (ref >60.00)
GLUCOSE: 114 mg/dL — AB (ref 70–99)
POTASSIUM: 4.8 meq/L (ref 3.5–5.1)
SODIUM: 137 meq/L (ref 135–145)

## 2016-01-20 ENCOUNTER — Ambulatory Visit (HOSPITAL_COMMUNITY): Payer: Medicare Other | Attending: Cardiology

## 2016-01-20 ENCOUNTER — Other Ambulatory Visit: Payer: Self-pay

## 2016-01-20 DIAGNOSIS — I340 Nonrheumatic mitral (valve) insufficiency: Secondary | ICD-10-CM | POA: Insufficient documentation

## 2016-01-20 DIAGNOSIS — I5189 Other ill-defined heart diseases: Secondary | ICD-10-CM | POA: Diagnosis not present

## 2016-01-20 DIAGNOSIS — I517 Cardiomegaly: Secondary | ICD-10-CM | POA: Insufficient documentation

## 2016-01-20 DIAGNOSIS — I351 Nonrheumatic aortic (valve) insufficiency: Secondary | ICD-10-CM | POA: Insufficient documentation

## 2016-01-20 DIAGNOSIS — I25119 Atherosclerotic heart disease of native coronary artery with unspecified angina pectoris: Secondary | ICD-10-CM | POA: Diagnosis not present

## 2016-01-20 DIAGNOSIS — I059 Rheumatic mitral valve disease, unspecified: Secondary | ICD-10-CM | POA: Diagnosis present

## 2016-02-04 ENCOUNTER — Encounter: Payer: Self-pay | Admitting: Family Medicine

## 2016-02-04 ENCOUNTER — Ambulatory Visit (INDEPENDENT_AMBULATORY_CARE_PROVIDER_SITE_OTHER): Payer: Medicare Other | Admitting: Family Medicine

## 2016-02-04 VITALS — BP 140/78 | HR 71 | Temp 98.1°F | Ht 67.0 in | Wt 177.2 lb

## 2016-02-04 DIAGNOSIS — M7062 Trochanteric bursitis, left hip: Secondary | ICD-10-CM | POA: Diagnosis not present

## 2016-02-04 MED ORDER — METHYLPREDNISOLONE ACETATE 40 MG/ML IJ SUSP
80.0000 mg | Freq: Once | INTRAMUSCULAR | Status: AC
  Administered 2016-02-04: 80 mg via INTRA_ARTICULAR

## 2016-02-04 NOTE — Progress Notes (Signed)
Pre visit review using our clinic review tool, if applicable. No additional management support is needed unless otherwise documented below in the visit note. 

## 2016-02-04 NOTE — Progress Notes (Signed)
Dr. Frederico Hamman T. Quin Mathenia, MD, [facility] [place], [address] Phone: [phone] Fax: [phone]  02/04/2016  Patient: [name] [name], MRN: [id number], DOB: [birth year], 82 y.o.  Primary Physician:  Arnette Norris, MD   Chief Complaint  Patient presents with  . Hip Pain   Subjective:   AMAND LEMOINE is a 82 y.o. very pleasant female patient who presents with the following:  F/u L hip lateral pain:  GTB. Patient who I remember well who presents with recurrent left-sided lateral hip pain.  She has had recurrent trochanteric bursitis on the left, and we've injected this a number of times.  She does have some resolution of symptoms the last anywhere from 2 or 3 months up to 6 months or longer, but she has had recurrence over time.  She is not having any significant back pain right now and she is not having any groin pain.  She moves her hip quite well.  Past Medical History, Surgical History, Social History, Family History, Problem List, Medications, and Allergies have been reviewed and updated if relevant.  Patient Active Problem List   Diagnosis Date Noted  . Dysuria 01/14/2016  . Pain in joints of both feet 01/14/2016  . Osteopenia 08/06/2015  . Constipation 12/30/2014  . RUQ pain 12/30/2014  . B12 deficiency 12/30/2014  . Allergic reaction 09/02/2014  . Arthralgia of right foot 07/18/2014  . Pruritic intertrigo 07/18/2014  . Right hip pain 06/04/2014  . Other malaise and fatigue 06/04/2014  . Elevated LFTs 04/19/2014  . Mitral regurgitation 04/19/2014  . Burning sensation of feet 06/13/2013  . Pes planus 06/13/2013  . Left hip pain 10/02/2012  . Renal insufficiency 06/07/2011  . Diabetes mellitus with renal manifestations, controlled ([place]) 05/31/2011  . MIXED HYPERLIPIDEMIA 08/04/2010  . BEN HTN HEART DISEASE WITHOUT HEART FAIL 08/04/2010  . LUMBAR SPRAIN AND STRAIN 01/15/2010  . VERTIGO 10/29/2009  . OSTEOPENIA  04/29/2009  . 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 02/25/2009  . EXTERNAL HEMORRHOIDS 02/24/2009  . DIVERTICULOSIS, COLON 02/24/2009  . HEMOPTYSIS 09/02/2008  . BACK PAIN, THORACIC REGION 04/30/2008  . LYMPHOMA, MALT 08/28/2007  . DETACHED RETINA 08/28/2007  . LABILE HYPERTENSION 08/28/2007  . Coronary atherosclerosis 08/28/2007  . MITRAL VALVE PROLAPSE 08/28/2007  . GERD 08/28/2007  . IBS 08/28/2007  . BREAST CYSTS, BILATERAL 08/28/2007  . LOW BACK PAIN, CHRONIC 06/29/2007    Past Medical History  Diagnosis Date  . GERD (gastroesophageal reflux disease)   . CAD (coronary artery disease)     a. s/p tandem Promus DES to LAD in 2009 by Dr. Olevia Perches;  b.  LHC (03/22/14):  prox LAD 30%, mid LAD 40%, LAD stent ok with dist 20% ISR, apical LAD occluded with L-L collats filling apical vessel (too small for PCI), mid RCA 20%, EF 70%.  Med Rx  . Urticaria   . Hyperlipidemia     intol of statins  . Hypertension   . Allergy     Cipro, Plavix, Statins  . Personal history of colonic polyps   . Diabetes mellitus     Diagnosed 2012  . NHL (non-Hodgkin's lymphoma) ([place]) dx'd 2003    chemo/xrt comp 2003  . Proctitis   . Diverticulosis of colon (without mention of hemorrhage)   . Hx of echocardiogram     a. Echo (07/2013):  Mild LVH, vigorous LVF, EF 65-70%, Gr 1 DD, mild MR, mild to mod LAE  .  Hx of cardiovascular stress test     a. Nuclear (08/2013):  No ischemia, EF 80%, Normal    Past Surgical History  Procedure Laterality Date  . Vaginal hysterectomy      partial, fibroids, one ovary left  . Knee surgery  10/2003    left  . Coronary angioplasty with stent placement      x 2  . Dexa-neg    . Cardiac cath-neg    . Laser surgery for cataracts-left    . Stress cardiolite    . Left heart catheterization with coronary angiogram N/A 03/22/2014    Procedure: LEFT HEART CATHETERIZATION WITH CORONARY ANGIOGRAM;  Surgeon: Burnell Blanks, MD;  Location: [facility] CATH LAB;   Service: Cardiovascular;  Laterality: N/A;    Social History   Social History  . Marital Status: Married    Spouse Name: N/A  . Number of Children: N/A  . Years of Education: N/A   Occupational History  . Retired    Social History Main Topics  . Smoking status: Never Smoker   . Smokeless tobacco: Never Used  . Alcohol Use: No  . Drug Use: No  . Sexual Activity: Not on file   Other Topics Concern  . Not on file   Social History Narrative    Family History  Problem Relation Age of Onset  . Cancer Mother     jaw  . Hypertension Father   . Heart attack Father   . Colon cancer Neg Hx   . Heart attack Sister     Allergies  Allergen Reactions  . Shellfish Allergy Anaphylaxis and Rash  . Cephalexin     REACTION: trash and swelling REACTION: trash and swelling  . Ciprofloxacin     REACTION: u/k  . Clopidogrel Bisulfate     REACTION: swelling, rash  . Codeine Phosphate     REACTION: unspecified  . Ezetimibe-Simvastatin     REACTION: myalgias REACTION: myalgias  . Hydrocod Polst-Cpm Polst Er     REACTION: u/k  . Lidocaine Hives    ONLY TO ADHESIVE LIDOCAINE PATCHES. NO PROBLEM WITH INJECTABLE LIDOCAINE.  Marland Kitchen Nitrofurantoin     REACTION: [place] REACTION: [place]  . Pregabalin     REACTION: felt bad REACTION: felt bad  . Propoxyphene N-Acetaminophen     REACTION: [place]  . Rofecoxib     unk unk   . Sulfa Antibiotics     REACTION: unspecified  . Sulfamethoxazole     REACTION: unspecified  . Sulfonamide Derivatives     REACTION: unspecified  . Celecoxib Rash    REACTION: [place]  . Codeine Rash    REACTION: [place]  . Hydrocod Polst-Cpm Polst Er Rash  . Other Rash  . Propoxyphene Rash    Medication list reviewed and updated in full in [facility].  GEN: No fevers, chills. Nontoxic. Primarily MSK c/o today. MSK: Detailed in the HPI GI: tolerating PO intake without difficulty Neuro: No numbness, parasthesias, or tingling associated. Otherwise the pertinent  positives of the ROS are noted above.   Objective:   BP 140/78 mmHg  Pulse 71  Temp(Src) 98.1 F (36.7 C) (Oral)  Ht 5' 7"  (1.702 m)  Wt 177 lb 4 oz (80.4 kg)  BMI 27.75 kg/m2   GEN: WDWN, NAD, Non-toxic, Alert & Oriented x 3 HEENT: Atraumatic, Normocephalic.  Ears and Nose: No external deformity. EXTR: No clubbing/cyanosis/edema NEURO: Normal gait.  PSYCH: Normally interactive. Conversant. Not depressed or anxious appearing.  Calm demeanor.  HIP EXAM: SIDE: L ROM: Abduction, Flexion, Internal and External range of motion: full Pain with terminal IROM and EROM: no GTB: ttp on the L lateral SLR: NEG Knees: No effusion FABER: NT REVERSE FABER: NT, neg Piriformis: NT at direct palpation Str: flexion: 5/5 abduction: 5/5 adduction: 5/5 Strength testing non-tender  Radiology: No results found.  Assessment and Plan:   Trochanteric bursitis of left hip - Plan: methylPREDNISolone acetate (DEPO-MEDROL) injection 80 mg  Her hip moves well overall, and I'm not entirely sure why she keeps having recurrent trochanteric bursitis.  Her strength is good.  Her hip range of motion is good.  No significant back pain.  For pain management and relief of symptoms, it is reasonable to do a repeat trochanteric bursa injection.  Trochanteric Bursitis Injection, LEFT Verbal consent obtained. Risks (including infection, potential atrophy), benefits, and alternatives reviewed. Greater trochanter sterilely prepped with Chloraprep. Ethyl Chloride used for anesthesia. 8 cc of Lidocaine 1% injected with 2 mL of Depo-Medrol 40 mg into trochanteric bursa at area of maximal tenderness at greater trochanter. Needle taken to bone to troch bursa, flows easily. Bursa massaged. No bleeding and no complications. Decreased pain after injection. Needle: 22 gauge spinal needle   Follow-up: prn  Signed,  Demitrios Molyneux T. Suzann Lazaro, MD   Patient's Medications  New Prescriptions   No medications on file  Previous  Medications   ASPIRIN (ASPIRIN LOW DOSE) 81 MG TABLET    Take 81 mg by mouth daily.     BLOOD GLUCOSE MONITORING SUPPL (ONETOUCH VERIO FLEX SYSTEM) W/DEVICE KIT    1 Device by Does not apply route once.   CETIRIZINE (ZYRTEC) 10 MG TABLET    Take 10 mg by mouth daily as needed for allergies.    CHOLECALCIFEROL (VITAMIN D-3) 1000 UNITS CAPS    Take 1 capsule by mouth daily.   COMBIGAN 0.2-0.5 % OPHTHALMIC SOLUTION    1 drop every 12 (twelve) hours.    CRANBERRY PO    Take 1 tablet by mouth daily.   EZETIMIBE (ZETIA) 10 MG TABLET    Take 1 tablet (10 mg total) by mouth daily.   GLIPIZIDE (GLUCOTROL) 5 MG TABLET    TAKE ONE TABLET BY MOUTH TWICE DAILY   GLUCOSE BLOOD (ONETOUCH VERIO) TEST STRIP    1 each by Other route 2 (two) times daily. Use as instructed   HYDROXYZINE (ATARAX/VISTARIL) 10 MG TABLET    Take 1 tablet (10 mg total) by mouth 3 (three) times daily as needed.   ISOSORBIDE MONONITRATE (IMDUR) 30 MG 24 HR TABLET    Take 1 tablet (30 mg total) by mouth daily.   LANSOPRAZOLE (PREVACID) 15 MG CAPSULE    TAKE ONE CAPSULE BY MOUTH EVERY DAY   LATANOPROST (XALATAN) 0.005 % OPHTHALMIC SOLUTION       LIDOCAINE (LIDODERM) 5 %    Place 1 patch onto the skin daily. Remove & Discard patch within 12 hours or as directed by MD   LOSARTAN (COZAAR) 50 MG TABLET    TAKE ONE TABLET BY MOUTH EVERY DAY   METOPROLOL SUCCINATE (TOPROL-XL) 100 MG 24 HR TABLET    TAKE ONE TABLET BY MOUTH EVERY MORNING   NITROGLYCERIN (NITROSTAT) 0.4 MG SL TABLET    Place 1 tablet (0.4 mg total) under the tongue every 5 (five) minutes as needed for chest pain.   ONE TOUCH LANCETS MISC    1 each by Does not apply route 2 (two) times daily.   TRAMADOL HCL 50  MG TBDP    1 tab by mouth three times daily as needed for severe pain   TRIMETHOPRIM (TRIMPEX) 100 MG TABLET       VITAMIN B-12 (CYANOCOBALAMIN) 500 MCG TABLET    Take 500 mcg by mouth daily.  Modified Medications   No medications on file  Discontinued Medications   No  medications on file

## 2016-02-04 NOTE — Patient Instructions (Signed)
Lidocaine 4% cream. Can use up to every 3 hours.

## 2016-02-05 ENCOUNTER — Telehealth: Payer: Self-pay

## 2016-02-05 NOTE — Telephone Encounter (Signed)
i am sorry if there was some confusion.   I meant for her to purchase one of the over the counter creams such as Aspercreme with 4% Lidocaine.  (Very few such creams have the lidocaine - so just check the label.)

## 2016-02-05 NOTE — Telephone Encounter (Signed)
Pt was seen on 02/04/16 and pt was advised to get OTC lidocaine 4% cream for topical use; pt was at [facility] and could not find cream; spoke with pharmacist at [facility] and advised lidocaine 4 % cream is primary ingredient in aspercreme; [facility] does not have just lidocaine 4 % cream OTC. Advised pt and she will purchase the aspercreme; pt will cb if needed. FYI to Dr [name].

## 2016-03-10 ENCOUNTER — Ambulatory Visit (INDEPENDENT_AMBULATORY_CARE_PROVIDER_SITE_OTHER): Payer: Medicare Other | Admitting: Family Medicine

## 2016-03-10 ENCOUNTER — Encounter: Payer: Self-pay | Admitting: Family Medicine

## 2016-03-10 VITALS — BP 120/70 | HR 69 | Temp 98.5°F | Ht 67.0 in | Wt 178.8 lb

## 2016-03-10 DIAGNOSIS — M25552 Pain in left hip: Secondary | ICD-10-CM | POA: Diagnosis not present

## 2016-03-10 DIAGNOSIS — M7062 Trochanteric bursitis, left hip: Secondary | ICD-10-CM

## 2016-03-10 DIAGNOSIS — M7632 Iliotibial band syndrome, left leg: Secondary | ICD-10-CM | POA: Diagnosis not present

## 2016-03-10 MED ORDER — METHYLPREDNISOLONE ACETATE 40 MG/ML IJ SUSP
80.0000 mg | Freq: Once | INTRAMUSCULAR | Status: AC
  Administered 2016-03-10: 80 mg via INTRA_ARTICULAR

## 2016-03-10 NOTE — Progress Notes (Signed)
Pre visit review using our clinic review tool, if applicable. No additional management support is needed unless otherwise documented below in the visit note. 

## 2016-03-10 NOTE — Progress Notes (Signed)
Dr. Frederico Hamman T. Zaydin Billey, MD, [facility] [place], [address] Phone: [phone] Fax: [phone]  03/10/2016  Patient: [name] [name], MRN: [id number], DOB: [birth year], 82 y.o.  Primary Physician:  Arnette Norris, MD   Chief Complaint  Patient presents with  . Leg Pain    left  . Hip Pain    left radiates down to her left leg.    Subjective:   [name] [name] is a 82 y.o. very pleasant female patient who presents with the following:  Buttocks pain on the left side. Radiating down the leg. This is mostly on the lateral, is never on the posterior aspect and never beyond the knee. She has pain in the lateral trochanteric bursal region. Her back doesn't have any pain at all. She occasionally will have some pain on the posterior rim on the left, but it is still around the bursa on the trochanter primarily. Strength is always been good, but she has not been all that diligent with doing any stretching of this region.  L GTB.  GTB inj  Rehab reviewed  02/04/2016 Last OV with Owens Loffler, MD  F/u L hip lateral pain:  GTB. Patient who I remember well who presents with recurrent left-sided lateral hip pain.  She has had recurrent trochanteric bursitis on the left, and we've injected this a number of times.  She does have some resolution of symptoms the last anywhere from 2 or 3 months up to 6 months or longer, but she has had recurrence over time.  She is not having any significant back pain right now and she is not having any groin pain.  She moves her hip quite well.  Past Medical History, Surgical History, Social History, Family History, Problem List, Medications, and Allergies have been reviewed and updated if relevant.  Patient Active Problem List   Diagnosis Date Noted  . Dysuria 01/14/2016  . Pain in joints of both feet 01/14/2016  . Osteopenia 08/06/2015  . Constipation 12/30/2014  . RUQ pain 12/30/2014  . B12 deficiency  12/30/2014  . Allergic reaction 09/02/2014  . Arthralgia of right foot 07/18/2014  . Pruritic intertrigo 07/18/2014  . Right hip pain 06/04/2014  . Other malaise and fatigue 06/04/2014  . Elevated LFTs 04/19/2014  . Mitral regurgitation 04/19/2014  . Burning sensation of feet 06/13/2013  . Pes planus 06/13/2013  . Left hip pain 10/02/2012  . Renal insufficiency 06/07/2011  . Diabetes mellitus with renal manifestations, controlled ([place]) 05/31/2011  . MIXED HYPERLIPIDEMIA 08/04/2010  . BEN HTN HEART DISEASE WITHOUT HEART FAIL 08/04/2010  . LUMBAR SPRAIN AND STRAIN 01/15/2010  . VERTIGO 10/29/2009  . OSTEOPENIA 04/29/2009  . 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 02/25/2009  . EXTERNAL HEMORRHOIDS 02/24/2009  . DIVERTICULOSIS, COLON 02/24/2009  . HEMOPTYSIS 09/02/2008  . BACK PAIN, THORACIC REGION 04/30/2008  . LYMPHOMA, MALT 08/28/2007  . DETACHED RETINA 08/28/2007  . LABILE HYPERTENSION 08/28/2007  . Coronary atherosclerosis 08/28/2007  . MITRAL VALVE PROLAPSE 08/28/2007  . GERD 08/28/2007  . IBS 08/28/2007  . BREAST CYSTS, BILATERAL 08/28/2007  . LOW BACK PAIN, CHRONIC 06/29/2007    Past Medical History  Diagnosis Date  . GERD (gastroesophageal reflux disease)   . CAD (coronary artery disease)     a. s/p tandem Promus DES to LAD in 2009 by Dr. Olevia Perches;  b.  LHC (03/22/14):  prox LAD 30%, mid LAD 40%, LAD stent ok with dist 20% ISR,  apical LAD occluded with L-L collats filling apical vessel (too small for PCI), mid RCA 20%, EF 70%.  Med Rx  . Urticaria   . Hyperlipidemia     intol of statins  . Hypertension   . Allergy     Cipro, Plavix, Statins  . Personal history of colonic polyps   . Diabetes mellitus     Diagnosed 2012  . NHL (non-Hodgkin's lymphoma) ([place]) dx'd 2003    chemo/xrt comp 2003  . Proctitis   . Diverticulosis of colon (without mention of hemorrhage)   . Hx of echocardiogram     a. Echo (07/2013):  Mild LVH, vigorous LVF, EF 65-70%, Gr 1 DD,  mild MR, mild to mod LAE  . Hx of cardiovascular stress test     a. Nuclear (08/2013):  No ischemia, EF 80%, Normal    Past Surgical History  Procedure Laterality Date  . Vaginal hysterectomy      partial, fibroids, one ovary left  . Knee surgery  10/2003    left  . Coronary angioplasty with stent placement      x 2  . Dexa-neg    . Cardiac cath-neg    . Laser surgery for cataracts-left    . Stress cardiolite    . Left heart catheterization with coronary angiogram N/A 03/22/2014    Procedure: LEFT HEART CATHETERIZATION WITH CORONARY ANGIOGRAM;  Surgeon: Burnell Blanks, MD;  Location: [facility] CATH LAB;  Service: Cardiovascular;  Laterality: N/A;    Social History   Social History  . Marital Status: Married    Spouse Name: N/A  . Number of Children: N/A  . Years of Education: N/A   Occupational History  . Retired    Social History Main Topics  . Smoking status: Never Smoker   . Smokeless tobacco: Never Used  . Alcohol Use: No  . Drug Use: No  . Sexual Activity: Not on file   Other Topics Concern  . Not on file   Social History Narrative    Family History  Problem Relation Age of Onset  . Cancer Mother     jaw  . Hypertension Father   . Heart attack Father   . Colon cancer Neg Hx   . Heart attack Sister     Allergies  Allergen Reactions  . Shellfish Allergy Anaphylaxis and Rash  . Cephalexin     REACTION: trash and swelling REACTION: trash and swelling  . Ciprofloxacin     REACTION: u/k  . Clopidogrel Bisulfate     REACTION: swelling, rash  . Codeine Phosphate     REACTION: unspecified  . Ezetimibe-Simvastatin     REACTION: myalgias REACTION: myalgias  . Hydrocod Polst-Cpm Polst Er     REACTION: u/k  . Lidocaine Hives    ONLY TO ADHESIVE LIDOCAINE PATCHES. NO PROBLEM WITH INJECTABLE LIDOCAINE.  Marland Kitchen Nitrofurantoin     REACTION: [place] REACTION: [place]  . Pregabalin     REACTION: felt bad REACTION: felt bad  . Propoxyphene N-Acetaminophen      REACTION: [place]  . Rofecoxib     unk unk   . Sulfa Antibiotics     REACTION: unspecified  . Sulfamethoxazole     REACTION: unspecified  . Sulfonamide Derivatives     REACTION: unspecified  . Celecoxib Rash    REACTION: [place]  . Codeine Rash    REACTION: [place]  . Hydrocod Polst-Cpm Polst Er Rash  . Other Rash  . Propoxyphene Rash    Medication list  reviewed and updated in full in [facility].  GEN: No fevers, chills. Nontoxic. Primarily MSK c/o today. MSK: Detailed in the HPI GI: tolerating PO intake without difficulty Neuro: No numbness, parasthesias, or tingling associated. Otherwise the pertinent positives of the ROS are noted above.   Objective:   BP 120/70 mmHg  Pulse 69  Temp(Src) 98.5 F (36.9 C) (Oral)  Ht 5' 7"  (1.702 m)  Wt 178 lb 12.8 oz (81.103 kg)  BMI 28.00 kg/m2  SpO2 96%   GEN: WDWN, NAD, Non-toxic, Alert & Oriented x 3 HEENT: Atraumatic, Normocephalic.  Ears and Nose: No external deformity. EXTR: No clubbing/cyanosis/edema NEURO: Normal gait.  PSYCH: Normally interactive. Conversant. Not depressed or anxious appearing.  Calm demeanor.   HIP EXAM: SIDE: L ROM: Abduction, Flexion, Internal and External range of motion: full Pain with terminal IROM and EROM: no GTB: ttp on the L lateral SLR: NEG Knees: No effusion FABER: NT REVERSE FABER: NT, neg Piriformis: NT at direct palpation Str: flexion: 5/5 abduction: 5/5 adduction: 5/5 Strength testing non-tender  Radiology: No results found.  Assessment and Plan:   Trochanteric bursitis of left hip  Left hip pain - Plan: methylPREDNISolone acetate (DEPO-MEDROL) injection 80 mg  Iliotibial band syndrome affecting lower leg, left   I did not fully know why the patient has had such difficulty with recurrent trochanteric bursitis. Her back seems to move quite well, her range of motion strength is excellent. She does have some tightness along the ITB as well as in her hips which may be  contributing. I'm going to alter some of her rehabilitation and have her begin doing more hip and ITB flexibility work.  I did inject her trochanteric bursa about 5 weeks ago, but in this extra-articular space I think that this is reasonable to repeat for pain control in an 82 year old.  Trochanteric Bursitis Injection, LEFT Verbal consent obtained. Risks (including infection, potential atrophy), benefits, and alternatives reviewed. Greater trochanter sterilely prepped with Chloraprep. Ethyl Chloride used for anesthesia. 8 cc of Lidocaine 1% injected with 2 mL of Depo-Medrol 40 mg into trochanteric bursa at area of maximal tenderness at greater trochanter. Needle taken to bone to troch bursa, flows easily. Bursa massaged. No bleeding and no complications. Decreased pain after injection. Needle: 22 gauge spinal needle    Follow-up: No Follow-up on file.  Signed,  Maud Deed. Deavion Dobbs, MD   Patient's Medications  New Prescriptions   No medications on file  Previous Medications   ASPIRIN (ASPIRIN LOW DOSE) 81 MG TABLET    Take 81 mg by mouth daily.     BLOOD GLUCOSE MONITORING SUPPL (ONETOUCH VERIO FLEX SYSTEM) W/DEVICE KIT    1 Device by Does not apply route once.   CETIRIZINE (ZYRTEC) 10 MG TABLET    Take 10 mg by mouth daily as needed for allergies.    CHOLECALCIFEROL (VITAMIN D-3) 1000 UNITS CAPS    Take 1 capsule by mouth daily.   COMBIGAN 0.2-0.5 % OPHTHALMIC SOLUTION    1 drop every 12 (twelve) hours.    CRANBERRY PO    Take 1 tablet by mouth daily.   EZETIMIBE (ZETIA) 10 MG TABLET    Take 1 tablet (10 mg total) by mouth daily.   GLIPIZIDE (GLUCOTROL) 5 MG TABLET    TAKE ONE TABLET BY MOUTH TWICE DAILY   GLUCOSE BLOOD (ONETOUCH VERIO) TEST STRIP    1 each by Other route 2 (two) times daily. Use as instructed   HYDROXYZINE (ATARAX/VISTARIL) 10  MG TABLET    Take 1 tablet (10 mg total) by mouth 3 (three) times daily as needed.   ISOSORBIDE MONONITRATE (IMDUR) 30 MG 24 HR TABLET    Take 1  tablet (30 mg total) by mouth daily.   LANSOPRAZOLE (PREVACID) 15 MG CAPSULE    TAKE ONE CAPSULE BY MOUTH EVERY DAY   LATANOPROST (XALATAN) 0.005 % OPHTHALMIC SOLUTION       LIDOCAINE (LIDODERM) 5 %    Place 1 patch onto the skin daily. Remove & Discard patch within 12 hours or as directed by MD   LOSARTAN (COZAAR) 50 MG TABLET    TAKE ONE TABLET BY MOUTH EVERY DAY   METOPROLOL SUCCINATE (TOPROL-XL) 100 MG 24 HR TABLET    TAKE ONE TABLET BY MOUTH EVERY MORNING   NITROGLYCERIN (NITROSTAT) 0.4 MG SL TABLET    Place 1 tablet (0.4 mg total) under the tongue every 5 (five) minutes as needed for chest pain.   ONE TOUCH LANCETS MISC    1 each by Does not apply route 2 (two) times daily.   TRAMADOL HCL 50 MG TBDP    1 tab by mouth three times daily as needed for severe pain   TRIMETHOPRIM (TRIMPEX) 100 MG TABLET       VITAMIN B-12 (CYANOCOBALAMIN) 500 MCG TABLET    Take 500 mcg by mouth daily.  Modified Medications   No medications on file  Discontinued Medications   No medications on file

## 2016-03-22 ENCOUNTER — Ambulatory Visit (INDEPENDENT_AMBULATORY_CARE_PROVIDER_SITE_OTHER): Payer: Medicare Other

## 2016-03-22 VITALS — BP 112/72 | HR 68 | Temp 98.5°F | Ht 69.0 in | Wt 177.0 lb

## 2016-03-22 DIAGNOSIS — Z0000 Encounter for general adult medical examination without abnormal findings: Secondary | ICD-10-CM

## 2016-03-22 NOTE — Progress Notes (Signed)
Pre visit review using our clinic review tool, if applicable. No additional management support is needed unless otherwise documented below in the visit note. 

## 2016-03-22 NOTE — Progress Notes (Signed)
Subjective:   [name] [name] is a 82 y.o. female who presents for an Initial Medicare Annual Wellness Visit.   Cardiac Risk Factors include: advanced age (>65mn, >>45women);diabetes mellitus     Objective:    Today's Vitals   03/22/16 1011  BP: 112/72  Pulse: 68  Temp: 98.5 F (36.9 C)  TempSrc: Oral  Height: _0  (1.753 m)  Weight: 177 lb (80.287 kg)  SpO2: 95%  PainSc: 0-No pain   Body mass index is 26.13 kg/(m^2).   Current Medications (verified) Outpatient Encounter Prescriptions as of 03/22/2016  Medication Sig  . aspirin (ASPIRIN LOW DOSE) 81 MG tablet Take 81 mg by mouth daily.    . Blood Glucose Monitoring Suppl ([place] w/Device KIT 1 Device by Does not apply route once.  . Cholecalciferol (VITAMIN D-3) 1000 UNITS CAPS Take 1 capsule by mouth daily.  . COMBIGAN 0.2-0.5 % ophthalmic solution 1 drop every 12 (twelve) hours.   . CRANBERRY PO Take 1 tablet by mouth daily.  .Marland Kitchenezetimibe (ZETIA) 10 MG tablet Take 1 tablet (10 mg total) by mouth daily.  .Marland KitchenglipiZIDE (GLUCOTROL) 5 MG tablet TAKE ONE TABLET BY MOUTH TWICE DAILY  . glucose blood (ONETOUCH VERIO) test strip 1 each by Other route 2 (two) times daily. Use as instructed  . isosorbide mononitrate (IMDUR) 30 MG 24 hr tablet Take 1 tablet (30 mg total) by mouth daily.  . lansoprazole (PREVACID) 15 MG capsule TAKE ONE CAPSULE BY MOUTH EVERY DAY  . latanoprost (XALATAN) 0.005 % ophthalmic solution Place 1 drop into both eyes at bedtime.   .Marland Kitchenlosartan (COZAAR) 50 MG tablet TAKE ONE TABLET BY MOUTH EVERY DAY  . metoprolol succinate (TOPROL-XL) 100 MG 24 hr tablet TAKE ONE TABLET BY MOUTH EVERY MORNING  . Multiple Vitamins-Minerals (EYE VITAMINS PO) Take 1 tablet by mouth.  . nitroGLYCERIN (NITROSTAT) 0.4 MG SL tablet Place 1 tablet (0.4 mg total) under the tongue every 5 (five) minutes as needed for chest pain.  . ONE TOUCH LANCETS MISC 1 each by Does not apply route 2 (two) times daily.  . vitamin  B-12 (CYANOCOBALAMIN) 500 MCG tablet Take 1,000 mcg by mouth daily.   . [DISCONTINUED] cetirizine (ZYRTEC) 10 MG tablet Take 10 mg by mouth daily as needed for allergies.   . [DISCONTINUED] hydrOXYzine (ATARAX/VISTARIL) 10 MG tablet Take 1 tablet (10 mg total) by mouth 3 (three) times daily as needed.  . [DISCONTINUED] lidocaine (LIDODERM) 5 % Place 1 patch onto the skin daily. Remove & Discard patch within 12 hours or as directed by MD  . [DISCONTINUED] TraMADol HCl 50 MG TBDP 1 tab by mouth three times daily as needed for severe pain  . [DISCONTINUED] trimethoprim (TRIMPEX) 100 MG tablet    No facility-administered encounter medications on file as of 03/22/2016.    Allergies (verified) Shellfish allergy; Cephalexin; Ciprofloxacin; Clopidogrel bisulfate; Codeine phosphate; Ezetimibe-simvastatin; Hydrocod polst-cpm polst er; Lidocaine; Nitrofurantoin; Pregabalin; Propoxyphene n-acetaminophen; Rofecoxib; Sulfa antibiotics; Sulfamethoxazole; Sulfonamide derivatives; Celecoxib; Codeine; Hydrocod polst-cpm polst er; Other; and Propoxyphene   History: Past Medical History  Diagnosis Date  . GERD (gastroesophageal reflux disease)   . CAD (coronary artery disease)     a. s/p tandem Promus DES to LAD in 2009 by Dr. BOlevia Perches  b.  LHC (03/22/14):  prox LAD 30%, mid LAD 40%, LAD stent ok with dist 20% ISR, apical LAD occluded with L-L collats filling apical vessel (too small for PCI), mid RCA 20%, EF 70%.  Med Rx  . Urticaria   . Hyperlipidemia     intol of statins  . Hypertension   . Allergy     Cipro, Plavix, Statins  . Personal history of colonic polyps   . Diabetes mellitus     Diagnosed 2012  . NHL (non-Hodgkin's lymphoma) ([place]) dx'd 2003    chemo/xrt comp 2003  . Proctitis   . Diverticulosis of colon (without mention of hemorrhage)   . Hx of echocardiogram     a. Echo (07/2013):  Mild LVH, vigorous LVF, EF 65-70%, Gr 1 DD, mild MR, mild to mod LAE  . Hx of cardiovascular stress test     a.  Nuclear (08/2013):  No ischemia, EF 80%, Normal   Past Surgical History  Procedure Laterality Date  . Vaginal hysterectomy      partial, fibroids, one ovary left  . Knee surgery  10/2003    left  . Coronary angioplasty with stent placement      x 2  . Dexa-neg    . Cardiac cath-neg    . Laser surgery for cataracts-left    . Stress cardiolite    . Left heart catheterization with coronary angiogram N/A 03/22/2014    Procedure: LEFT HEART CATHETERIZATION WITH CORONARY ANGIOGRAM;  Surgeon: Burnell Blanks, MD;  Location: [facility] CATH LAB;  Service: Cardiovascular;  Laterality: N/A;   Family History  Problem Relation Age of Onset  . Cancer Mother     jaw  . Hypertension Father   . Heart attack Father   . Colon cancer Neg Hx   . Heart attack Sister    Social History   Occupational History  . Retired    Social History Main Topics  . Smoking status: Never Smoker   . Smokeless tobacco: Never Used  . Alcohol Use: No  . Drug Use: No  . Sexual Activity: No    Tobacco Counseling Counseling given: No   Activities of Daily Living In your present state of health, do you have any difficulty performing the following activities: 03/22/2016  Hearing? N  Vision? N  Difficulty concentrating or making decisions? N  Walking or climbing stairs? N  Dressing or bathing? N  Doing errands, shopping? N  Preparing Food and eating ? N  Using the Toilet? N  In the past six months, have you accidently leaked urine? N  Do you have problems with loss of bowel control? N  Managing your Medications? N  Managing your Finances? N  Housekeeping or managing your Housekeeping? N    Immunizations and Health Maintenance Immunization History  Administered Date(s) Administered  . Influenza Split 09/14/2011, 08/28/2012  . Influenza Whole 09/09/2004, 09/05/2006, 08/31/2007, 08/29/2008, 09/18/2009, 09/01/2010  . Influenza,inj,Quad PF,36+ Mos 08/26/2014, 08/06/2015  . Pneumococcal Conjugate-13  08/26/2014  . Pneumococcal Polysaccharide-23 09/09/2004  . Td 05/30/1999  . Zoster 04/15/2010   There are no preventive care reminders to display for this patient.  Patient Care Team: Lucille Passy, MD as PCP - General Rana Snare, MD as Consulting Physician (Urology) Ladene Artist, MD as Consulting Physician (Gastroenterology) Thelma Comp, OD as Consulting Physician (Optometry)     Assessment:   This is a routine wellness examination for [facility].   Hearing/Vision screen  Hearing Screening   _0  _1  _2  _3  _4  _5  _6   Right ear:   40 40 40 0   Left ear:   0 40 40 0   Vision Screening Comments: Last eye exam in Jan 2017  Dietary issues  and exercise activities discussed: Current Exercise Habits: Home exercise routine, Type of exercise: walking, Time (Minutes): 30, Frequency (Times/Week): 2, Weekly Exercise (Minutes/Week): 60, Intensity: Mild, Exercise limited by: None identified  Goals    . Increase physical activity     Starting 03/22/2016, I will continue to walk for at least 30 min twice weekly and monitor my diet.       Depression Screen PHQ 2/9 Scores 03/22/2016 10/02/2012  PHQ - 2 Score 0 0    Fall Risk Fall Risk  03/22/2016  Falls in the past year? No    Cognitive Function: MMSE - Mini Mental State Exam 03/22/2016  Orientation to time 5  Orientation to Place 5  Registration 3  Attention/ Calculation 0  Recall 3  Language- name 2 objects 0  Language- repeat 1  Language- follow 3 step command 3  Language- read & follow direction 0  Write a sentence 0  Copy design 0  Total score 20   PLEASE NOTE: A Mini-Cog screen was completed. Maximum score is 20. A value of 0 denotes this part of [place] MMSE was not completed.  Orientation to Time - Max 5 Orientation to Place - Max 5 Registration - Max 3 Recall - Max 3 Language Repeat - Max 1 Language Follow 3 Step Command - Max 3  Screening Tests Health Maintenance  Topic Date Due  .  TETANUS/TDAP  03/22/2017 (Originally 05/29/2009)  . MAMMOGRAM  04/14/2016  . INFLUENZA VACCINE  06/29/2016  . HEMOGLOBIN A1C  07/13/2016  . OPHTHALMOLOGY EXAM  12/01/2016  . FOOT EXAM  01/13/2017  . DEXA SCAN  Completed  . ZOSTAVAX  Completed  . PNA vac Low Risk Adult  Completed      Plan:      I have personally reviewed and addressed the Medicare Annual Wellness questionnaire and have noted the following in the patient's chart:  A. Medical and social history B. Use of alcohol, tobacco or illicit drugs  C. Current medications and supplements D. Functional ability and status E.  Nutritional status F.  Physical activity G. Advance directives H. List of other physicians I.  Hospitalizations, surgeries, and ER visits in previous 12 months J.  [place] to include hearing, vision, cognitive, depression L. Referrals and appointments - none  In addition, I have reviewed and discussed with patient certain preventive protocols, quality metrics, and best practice recommendations. A written personalized care plan for preventive services as well as general preventive health recommendations were provided to patient.  See attached scanned questionnaire for additional information.   Signed,   Lindell Noe, MHA, BS, LPN Health Advisor [id number]

## 2016-03-22 NOTE — Progress Notes (Signed)
I reviewed health advisor's note, was available for consultation, and agree with documentation and plan.  

## 2016-03-22 NOTE — Patient Instructions (Addendum)
[name] [name] , Thank you for taking time to come for your Medicare Wellness Visit. I appreciate your ongoing commitment to your health goals. Please review the following plan we discussed and let me know if I can assist you in the future.   These are the goals we discussed: Goals    . Increase physical activity     Starting 03/22/2016, I will continue to walk for at least 30 min twice weekly and monitor my diet.        This is a list of the screening recommended for you and due dates:  Health Maintenance  Topic Date Due  . Tetanus Vaccine  03/22/2017*  . Mammogram  04/14/2016  . Flu Shot  06/29/2016  . Hemoglobin A1C  07/13/2016  . Eye exam for diabetics  12/01/2016  . Complete foot exam   01/13/2017  . DEXA scan (bone density measurement)  Completed  . Shingles Vaccine  Completed  . Pneumonia vaccines  Completed  *Topic was postponed. The date shown is not the original due date.   Preventive Care for Adults  A healthy lifestyle and preventive care can promote health and wellness. Preventive health guidelines for adults include the following key practices.  . A routine yearly physical is a good way to check with your health care provider about your health and preventive screening. It is a chance to share any concerns and updates on your health and to receive a thorough exam.  . Visit your dentist for a routine exam and preventive care every 6 months. Brush your teeth twice a day and floss once a day. Good oral hygiene prevents tooth decay and gum disease.  . The frequency of eye exams is based on your age, health, family medical history, use  of contact lenses, and other factors. Follow your health care provider's ecommendations for frequency of eye exams.  . Eat a healthy diet. Foods like vegetables, fruits, whole grains, low-fat dairy products, and lean protein foods contain the nutrients you need without too many calories. Decrease your intake of foods high in solid fats, added  sugars, and salt. Eat the right amount of calories for you. Get information about a proper diet from your health care provider, if necessary.  . Regular physical exercise is one of the most important things you can do for your health. Most adults should get at least 150 minutes of moderate-intensity exercise (any activity that increases your heart rate and causes you to sweat) each week. In addition, most adults need muscle-strengthening exercises on 2 or more days a week.  Silver Sneakers may be a benefit available to you. To determine eligibility, you may visit the website: www.silversneakers.com or contact program at 571-015-8691 Mon-Fri between 8AM-8PM.   . Maintain a healthy weight. The body mass index (BMI) is a screening tool to identify possible weight problems. It provides an estimate of body fat based on height and weight. Your health care provider can find your BMI and can help you achieve or maintain a healthy weight.   For adults 20 years and older: ? A BMI below 18.5 is considered underweight. ? A BMI of 18.5 to 24.9 is normal. ? A BMI of 25 to 29.9 is considered overweight. ? A BMI of 30 and above is considered obese.   . Maintain normal blood lipids and cholesterol levels by exercising and minimizing your intake of saturated fat. Eat a balanced diet with plenty of fruit and vegetables. Blood tests for lipids and cholesterol should  begin at age 68 and be repeated every 5 years. If your lipid or cholesterol levels are high, you are over 50, or you are at high risk for heart disease, you may need your cholesterol levels checked more frequently. Ongoing high lipid and cholesterol levels should be treated with medicines if diet and exercise are not working.  . If you smoke, find out from your health care provider how to quit. If you do not use tobacco, please do not start.  . If you choose to drink alcohol, please do not consume more than 2 drinks per day. One drink is considered to  be 12 ounces (355 mL) of beer, 5 ounces (148 mL) of wine, or 1.5 ounces (44 mL) of liquor.  . If you are 35-50 years old, ask your health care provider if you should take aspirin to prevent strokes.  . Use sunscreen. Apply sunscreen liberally and repeatedly throughout the day. You should seek shade when your shadow is shorter than you. Protect yourself by wearing long sleeves, pants, a wide-brimmed hat, and sunglasses year round, whenever you are outdoors.  . Once a month, do a whole body skin exam, using a mirror to look at the skin on your back. Tell your health care provider of new moles, moles that have irregular borders, moles that are larger than a pencil eraser, or moles that have changed in shape or color.    Fall Prevention in the Home  Falls can cause injuries. They can happen to people of all ages. There are many things you can do to make your home safe and to help prevent falls.  WHAT CAN I DO ON THE OUTSIDE OF MY HOME?  Regularly fix the edges of walkways and driveways and fix any cracks.  Remove anything that might make you trip as you walk through a door, such as a raised step or threshold.  Trim any bushes or trees on the path to your home.  Use bright outdoor lighting.  Clear any walking paths of anything that might make someone trip, such as rocks or tools.  Regularly check to see if handrails are loose or broken. Make sure that both sides of any steps have handrails.  Any raised decks and porches should have guardrails on the edges.  Have any leaves, snow, or ice cleared regularly.  Use sand or salt on walking paths during winter.  Clean up any spills in your garage right away. This includes oil or grease spills. WHAT CAN I DO IN THE BATHROOM?   Use night lights.  Install grab bars by the toilet and in the tub and shower. Do not use towel bars as grab bars.  Use non-skid mats or decals in the tub or shower.  If you need to sit down in the shower, use a  plastic, non-slip stool.  Keep the floor dry. Clean up any water that spills on the floor as soon as it happens.  Remove soap buildup in the tub or shower regularly.  Attach bath mats securely with double-sided non-slip rug tape.  Do not have throw rugs and other things on the floor that can make you trip. WHAT CAN I DO IN THE BEDROOM?  Use night lights.  Make sure that you have a light by your bed that is easy to reach.  Do not use any sheets or blankets that are too big for your bed. They should not hang down onto the floor.  Have a firm chair that has side  arms. You can use this for support while you get dressed.  Do not have throw rugs and other things on the floor that can make you trip. WHAT CAN I DO IN THE KITCHEN?  Clean up any spills right away.  Avoid walking on wet floors.  Keep items that you use a lot in easy-to-reach places.  If you need to reach something above you, use a strong step stool that has a grab bar.  Keep electrical cords out of the way.  Do not use floor polish or wax that makes floors slippery. If you must use wax, use non-skid floor wax.  Do not have throw rugs and other things on the floor that can make you trip. WHAT CAN I DO WITH MY STAIRS?  Do not leave any items on the stairs.  Make sure that there are handrails on both sides of the stairs and use them. Fix handrails that are broken or loose. Make sure that handrails are as long as the stairways.  Check any carpeting to make sure that it is firmly attached to the stairs. Fix any carpet that is loose or worn.  Avoid having throw rugs at the top or bottom of the stairs. If you do have throw rugs, attach them to the floor with carpet tape.  Make sure that you have a light switch at the top of the stairs and the bottom of the stairs. If you do not have them, ask someone to add them for you. WHAT ELSE CAN I DO TO HELP PREVENT FALLS?  Wear shoes that:  Do not have high heels.  Have  rubber bottoms.  Are comfortable and fit you well.  Are closed at the toe. Do not wear sandals.  If you use a stepladder:  Make sure that it is fully opened. Do not climb a closed stepladder.  Make sure that both sides of the stepladder are locked into place.  Ask someone to hold it for you, if possible.  Clearly mark and make sure that you can see:  Any grab bars or handrails.  First and last steps.  Where the edge of each step is.  Use tools that help you move around (mobility aids) if they are needed. These include:  Canes.  Walkers.  Scooters.  Crutches.  Turn on the lights when you go into a dark area. Replace any light bulbs as soon as they burn out.  Set up your furniture so you have a clear path. Avoid moving your furniture around.  If any of your floors are uneven, fix them.  If there are any pets around you, be aware of where they are.  Review your medicines with your doctor. Some medicines can make you feel dizzy. This can increase your chance of falling. Ask your doctor what other things that you can do to help prevent falls.   This information is not intended to replace advice given to you by your health care provider. Make sure you discuss any questions you have with your health care provider.   Document Released: 09/11/2009 Document Revised: 04/01/2015 Document Reviewed: 12/20/2014 Elsevier Interactive Patient Education [employer].

## 2016-04-12 ENCOUNTER — Telehealth: Payer: Self-pay | Admitting: Family Medicine

## 2016-04-12 NOTE — Telephone Encounter (Signed)
Patient Name: [name] [name] DOB: [birth year] Initial Comment Caller states [facility] Lerner; called earlier re UTI; MD gave trimethoprin, advised her to keep on hand; pharmacist adv for bladder; wants to know if can take meds and see how is tomorrow and reschedule appt; husband's phy therapist comes at [facility] tomorrow; sees Dr Risa Grill for kidneys; diabetic; Nurse Assessment Nurse: Ronnald Ramp, RN, Miranda Date/Time (Eastern Time): 04/12/2016 1:26:27 PM Confirm and document reason for call. If symptomatic, describe symptoms. You must click the next button to save text entered. ---Caller states she woke up and having pain and burning when she urinates. She made an appt for tomorrow but then remembered she had a standing prescription for Trimethoprim and wants to know if she can take that and cancel appt. Has the patient traveled out of the country within the last 30 days? ---Not Applicable Does the patient have any new or worsening symptoms? ---Yes Will a triage be completed? ---Yes Related visit to physician within the last 2 weeks? ---No Does the PT have any chronic conditions? (i.e. diabetes, asthma, etc.) ---Yes List chronic conditions. ---Diabetes, Is this a behavioral health or substance abuse call? ---No Guidelines Guideline Title Affirmed Question Affirmed Notes Urination Pain - Female Diabetes mellitus or weak immune system (e.g., HIV positive, cancer chemotherapy, transplant patient) Final Disposition User See Physician within 4 Hours (or PCP triage) Ronnald Ramp, RN, Miranda Comments Told caller that Trimethoprim is an antibiotic. She wants to cancel appt and will call her specialist if symptoms persist. Appt for tomorrow cancelled at callers request. Referrals GO TO FACILITY REFUSED Disagree/Comply: Disagree Disagree/Comply Reason: Disagree with instructions

## 2016-04-13 ENCOUNTER — Ambulatory Visit: Payer: Medicare Other | Admitting: Family Medicine

## 2016-04-20 ENCOUNTER — Encounter: Payer: Self-pay | Admitting: Internal Medicine

## 2016-04-20 ENCOUNTER — Ambulatory Visit (INDEPENDENT_AMBULATORY_CARE_PROVIDER_SITE_OTHER): Payer: Medicare Other | Admitting: Internal Medicine

## 2016-04-20 ENCOUNTER — Encounter: Payer: Self-pay | Admitting: Family Medicine

## 2016-04-20 VITALS — BP 110/70 | HR 61 | Temp 97.7°F | Wt 178.8 lb

## 2016-04-20 DIAGNOSIS — L509 Urticaria, unspecified: Secondary | ICD-10-CM | POA: Diagnosis not present

## 2016-04-20 MED ORDER — METHYLPREDNISOLONE ACETATE 80 MG/ML IJ SUSP
80.0000 mg | Freq: Once | INTRAMUSCULAR | Status: AC
  Administered 2016-04-20: 80 mg via INTRAMUSCULAR

## 2016-04-20 NOTE — Patient Instructions (Signed)
Hives Hives are itchy, red, swollen areas of the skin. They can vary in size and location on your body. Hives can come and go for hours or several days (acute hives) or for several weeks (chronic hives). Hives do not spread from person to person (noncontagious). They may get worse with scratching, exercise, and emotional stress. CAUSES   Allergic reaction to food, additives, or drugs.  Infections, including the common cold.  Illness, such as vasculitis, lupus, or thyroid disease.  Exposure to sunlight, heat, or cold.  Exercise.  Stress.  Contact with chemicals. SYMPTOMS   Red or white swollen patches on the skin. The patches may change size, shape, and location quickly and repeatedly.  Itching.  Swelling of the hands, feet, and face. This may occur if hives develop deeper in the skin. DIAGNOSIS  Your caregiver can usually tell what is wrong by performing a physical exam. Skin or blood tests may also be done to determine the cause of your hives. In some cases, the cause cannot be determined. TREATMENT  Mild cases usually get better with medicines such as antihistamines. Severe cases may require an emergency epinephrine injection. If the cause of your hives is known, treatment includes avoiding that trigger.  HOME CARE INSTRUCTIONS   Avoid causes that trigger your hives.  Take antihistamines as directed by your caregiver to reduce the severity of your hives. Non-sedating or low-sedating antihistamines are usually recommended. Do not drive while taking an antihistamine.  Take any other medicines prescribed for itching as directed by your caregiver.  Wear loose-fitting clothing.  Keep all follow-up appointments as directed by your caregiver. SEEK MEDICAL CARE IF:   You have persistent or severe itching that is not relieved with medicine.  You have painful or swollen joints. SEEK IMMEDIATE MEDICAL CARE IF:   You have a fever.  Your tongue or lips are swollen.  You have  trouble breathing or swallowing.  You feel tightness in the throat or chest.  You have abdominal pain. These problems may be the first sign of a life-threatening allergic reaction. Call your local emergency services (911 in [place] MAKE SURE YOU:   Understand these instructions.  Will watch your condition.  Will get help right away if you are not doing well or get worse.   This information is not intended to replace advice given to you by your health care provider. Make sure you discuss any questions you have with your health care provider.   Document Released: 11/15/2005 Document Revised: 11/20/2013 Document Reviewed: 02/08/2012 Elsevier Interactive Patient Education 2016 Elsevier Inc.  

## 2016-04-20 NOTE — Progress Notes (Signed)
Pre visit review using our clinic review tool, if applicable. No additional management support is needed unless otherwise documented below in the visit note. 

## 2016-04-20 NOTE — Progress Notes (Addendum)
Subjective:    Patient ID: [name] [name], female    DOB: 1932/03/05, 82 y.o.   MRN: [id number]  HPI  Pt presents to the clinic today with c/o a rash. It started out this morning on her arms. It has spread to her trunk, back and legs. The rash is very itchy. She did take Benadryl with some relief. She thinks it is hives, as she has had this many times in the past. She has had a thorough workup to identify the cause of the hives but all the testing has come back negative. She has been very stressed lately- her husband was recently diagnosed with hives. She denies changes in soaps, lotions or detergents.  Review of Systems      Past Medical History  Diagnosis Date  . GERD (gastroesophageal reflux disease)   . CAD (coronary artery disease)     a. s/p tandem Promus DES to LAD in 2009 by Dr. Olevia Perches;  b.  LHC (03/22/14):  prox LAD 30%, mid LAD 40%, LAD stent ok with dist 20% ISR, apical LAD occluded with L-L collats filling apical vessel (too small for PCI), mid RCA 20%, EF 70%.  Med Rx  . Urticaria   . Hyperlipidemia     intol of statins  . Hypertension   . Allergy     Cipro, Plavix, Statins  . Personal history of colonic polyps   . Diabetes mellitus     Diagnosed 2012  . NHL (non-Hodgkin's lymphoma) ([place]) dx'd 2003    chemo/xrt comp 2003  . Proctitis   . Diverticulosis of colon (without mention of hemorrhage)   . Hx of echocardiogram     a. Echo (07/2013):  Mild LVH, vigorous LVF, EF 65-70%, Gr 1 DD, mild MR, mild to mod LAE  . Hx of cardiovascular stress test     a. Nuclear (08/2013):  No ischemia, EF 80%, Normal    Current Outpatient Prescriptions  Medication Sig Dispense Refill  . aspirin (ASPIRIN LOW DOSE) 81 MG tablet Take 81 mg by mouth daily.      . Blood Glucose Monitoring Suppl ([place]) w/Device KIT 1 Device by Does not apply route once. 1 kit 0  . Cholecalciferol (VITAMIN D-3) 1000 UNITS CAPS Take 1 capsule by mouth daily.    . COMBIGAN 0.2-0.5 %  ophthalmic solution 1 drop every 12 (twelve) hours.     . CRANBERRY PO Take 1 tablet by mouth daily.    Marland Kitchen ezetimibe (ZETIA) 10 MG tablet Take 1 tablet (10 mg total) by mouth daily. 30 tablet 11  . glipiZIDE (GLUCOTROL) 5 MG tablet TAKE ONE TABLET BY MOUTH TWICE DAILY 60 tablet 3  . glucose blood (ONETOUCH VERIO) test strip 1 each by Other route 2 (two) times daily. Use as instructed 100 each 3  . isosorbide mononitrate (IMDUR) 30 MG 24 hr tablet Take 1 tablet (30 mg total) by mouth daily. 30 tablet 11  . lansoprazole (PREVACID) 15 MG capsule TAKE ONE CAPSULE BY MOUTH EVERY DAY 30 capsule 11  . latanoprost (XALATAN) 0.005 % ophthalmic solution Place 1 drop into both eyes at bedtime.     Marland Kitchen losartan (COZAAR) 50 MG tablet TAKE ONE TABLET BY MOUTH EVERY DAY 30 tablet 11  . metoprolol succinate (TOPROL-XL) 100 MG 24 hr tablet TAKE ONE TABLET BY MOUTH EVERY MORNING 30 tablet 6  . Multiple Vitamins-Minerals (EYE VITAMINS PO) Take 1 tablet by mouth.    . nitroGLYCERIN (NITROSTAT) 0.4 MG SL  tablet Place 1 tablet (0.4 mg total) under the tongue every 5 (five) minutes as needed for chest pain. 30 tablet 3  . ONE TOUCH LANCETS MISC 1 each by Does not apply route 2 (two) times daily.    . vitamin B-12 (CYANOCOBALAMIN) 500 MCG tablet Take 1,000 mcg by mouth daily.     . [DISCONTINUED] loratadine (CLARITIN) 10 MG tablet Take 10 mg by mouth as needed.      No current facility-administered medications for this visit.    Allergies  Allergen Reactions  . Shellfish Allergy Anaphylaxis and Rash  . Cephalexin     REACTION: trash and swelling REACTION: trash and swelling  . Ciprofloxacin     REACTION: u/k  . Clopidogrel Bisulfate     REACTION: swelling, rash  . Codeine Phosphate     REACTION: unspecified  . Ezetimibe-Simvastatin     REACTION: myalgias REACTION: myalgias  . Hydrocod Polst-Cpm Polst Er     REACTION: u/k  . Lidocaine Hives    ONLY TO ADHESIVE LIDOCAINE PATCHES. NO PROBLEM WITH INJECTABLE  LIDOCAINE.  Marland Kitchen Nitrofurantoin     REACTION: [place] REACTION: [place]  . Pregabalin     REACTION: felt bad REACTION: felt bad  . Propoxyphene N-Acetaminophen     REACTION: [place]  . Rofecoxib     unk unk   . Sulfa Antibiotics     REACTION: unspecified  . Sulfamethoxazole     REACTION: unspecified  . Sulfonamide Derivatives     REACTION: unspecified  . Celecoxib Rash    REACTION: [place]  . Codeine Rash    REACTION: [place]  . Hydrocod Polst-Cpm Polst Er Rash  . Other Rash  . Propoxyphene Rash    Family History  Problem Relation Age of Onset  . Cancer Mother     jaw  . Hypertension Father   . Heart attack Father   . Colon cancer Neg Hx   . Heart attack Sister     Social History   Social History  . Marital Status: Married    Spouse Name: N/A  . Number of Children: N/A  . Years of Education: N/A   Occupational History  . Retired    Social History Main Topics  . Smoking status: Never Smoker   . Smokeless tobacco: Never Used  . Alcohol Use: No  . Drug Use: No  . Sexual Activity: No   Other Topics Concern  . Not on file   Social History Narrative     Constitutional: Denies fever, malaise, fatigue, headache or abrupt weight changes.  Respiratory: Denies difficulty breathing, shortness of breath, cough or sputum production.   Cardiovascular: Denies chest pain, chest tightness, palpitations or swelling in the hands or feet.  Skin: Pt reports rash. Denies redness, lesions or ulcercations.    No other specific complaints in a complete review of systems (except as listed in HPI above).  Objective:   Physical Exam  BP 110/70 mmHg  Pulse 61  Temp(Src) 97.7 F (36.5 C) (Oral)  Wt 178 lb 12 oz (81.08 kg) Wt Readings from Last 3 Encounters:  04/20/16 178 lb 12 oz (81.08 kg)  03/22/16 177 lb (80.287 kg)  03/10/16 178 lb 12.8 oz (81.103 kg)    General: Appears her stated age, well developed, well nourished in NAD. Skin: Scattered wheals, noted on arms, back, abdomen and  upper thighs. Pulmonary/Chest: Normal effort and positive vesicular breath sounds. No respiratory distress. No wheezes, rales or ronchi noted.   BMET  Component Value Date/Time   NA 137 01/15/2016 1339   K 4.8 01/15/2016 1339   CL 102 01/15/2016 1339   CO2 31 01/15/2016 1339   GLUCOSE 114* 01/15/2016 1339   BUN 28* 01/15/2016 1339   CREATININE 1.34* 01/15/2016 1339   CALCIUM 9.5 01/15/2016 1339   GFRNONAA 51.78 09/23/2010 0917   GFRAA 62 02/04/2009 0833    Lipid Panel     Component Value Date/Time   CHOL 216* 01/14/2016 0934   TRIG 152.0* 01/14/2016 0934   HDL 52.30 01/14/2016 0934   CHOLHDL 4 01/14/2016 0934   VLDL 30.4 01/14/2016 0934   LDLCALC 133* 01/14/2016 0934    CBC    Component Value Date/Time   WBC 7.7 09/13/2014 0925   WBC 4.5 12/25/2007 0834   RBC 4.40 09/13/2014 0925   RBC 4.34 12/25/2007 0834   HGB 14.1 09/13/2014 0925   HGB 14.6 12/25/2007 0834   HCT 43.2 09/13/2014 0925   HCT 42.1 12/25/2007 0834   PLT 252.0 09/13/2014 0925   PLT 192 12/25/2007 0834   MCV 98.2 09/13/2014 0925   MCV 96.9 12/25/2007 0834   MCH 33.6 12/25/2007 0834   MCHC 32.6 09/13/2014 0925   MCHC 34.7 12/25/2007 0834   RDW 13.5 09/13/2014 0925   RDW 13.2 12/25/2007 0834   LYMPHSABS 2.6 09/13/2014 0925   LYMPHSABS 1.9 12/25/2007 0834   MONOABS 0.6 09/13/2014 0925   MONOABS 0.3 12/25/2007 0834   EOSABS 0.1 09/13/2014 0925   EOSABS 0.1 12/25/2007 0834   BASOSABS 0.0 09/13/2014 0925   BASOSABS 0.0 12/25/2007 0834    Hgb A1C Lab Results  Component Value Date   HGBA1C 7.2* 01/14/2016        Assessment & Plan:   Hives:  Continue Benadryl 25 mg every 12 hours for the next 36 hours 80 mg Depo IM today If persist or worsen, she will call back and will give RX for Pred Taper  RTC as needed or if symptoms persist or worsen  Webb Silversmith, NP

## 2016-04-20 NOTE — Addendum Note (Signed)
Addended by: Lurlean Nanny on: 04/20/2016 09:39 AM   Modules accepted: Orders

## 2016-04-21 ENCOUNTER — Telehealth: Payer: Self-pay | Admitting: Family Medicine

## 2016-04-21 ENCOUNTER — Other Ambulatory Visit: Payer: Self-pay | Admitting: Internal Medicine

## 2016-04-21 MED ORDER — PREDNISONE 10 MG PO TABS: ORAL_TABLET | ORAL | Status: DC

## 2016-04-21 NOTE — Telephone Encounter (Signed)
Prednisone called into pharmacy 

## 2016-04-21 NOTE — Telephone Encounter (Signed)
Patient stated that she was seen yesterday and given a shot for hives. Patient stated that she was told to call back today and you would send something in to the pharmacy for this. Patient stated that she is also taking Benadryl and it helps some, but itching starts back before the next dose is due. Pharmacy [facility]. Patient requested a call back on her cell [phone]

## 2016-04-21 NOTE — Telephone Encounter (Signed)
Pt is aware.  

## 2016-04-21 NOTE — Telephone Encounter (Signed)
Pt called stating she is broke out with hives.  They are all over legs waist and arms.  [place]  Please advise pt what she needs to do

## 2016-04-23 ENCOUNTER — Ambulatory Visit (INDEPENDENT_AMBULATORY_CARE_PROVIDER_SITE_OTHER): Payer: Medicare Other | Admitting: Primary Care

## 2016-04-23 ENCOUNTER — Encounter: Payer: Self-pay | Admitting: Primary Care

## 2016-04-23 VITALS — BP 124/76 | HR 63 | Temp 97.3°F | Ht 67.0 in | Wt 178.8 lb

## 2016-04-23 DIAGNOSIS — R21 Rash and other nonspecific skin eruption: Secondary | ICD-10-CM | POA: Diagnosis not present

## 2016-04-23 MED ORDER — TRIAMCINOLONE ACETONIDE 0.1 % EX CREA: 1.0000 "application " | TOPICAL_CREAM | Freq: Two times a day (BID) | CUTANEOUS | Status: DC

## 2016-04-23 MED ORDER — PREDNISONE 20 MG PO TABS: ORAL_TABLET | ORAL | Status: DC

## 2016-04-23 NOTE — Patient Instructions (Signed)
Stop your current regimen of prednisone.  Start Prednisone 20 mg. Take 3 tablets for 3 days, then 2 tablets for 3 days, then 1 tablet for 3 days.  You may apply the Triamcinolone cream twice daily to those bothersome, itchy spots on your skin. Do not apply this to your face. Wash your hands after each application.  Continue benadryl as discussed.  Please schedule an appointment with Dr. Deborra Medina to discuss anxiety and nerves as this is likely triggering your rash.  It was a pleasure meeting you!

## 2016-04-23 NOTE — Progress Notes (Signed)
Pre visit review using our clinic review tool, if applicable. No additional management support is needed unless otherwise documented below in the visit note. 

## 2016-04-23 NOTE — Progress Notes (Signed)
Subjective:    Patient ID: [name] [name], female    DOB: 02-04-1932, 82 y.o.   MRN: [id number]  HPI  [name] [name] is an 82 year old female who presents today with a chief complaint of rash. She was evaluated on 05/23 with a chief complaint of rash that had began that day. She's had prior testing for evaluation of hives/rash in the past with negative testing. During her recent visit she was instructed to take benadryl twice daily and also provided with IM Depo Medrol. She called back the next day with complaints of hives and was prescribed a prednisone taper.   Since her last visit she's taken prednisone and benadryl as instructed. She's not taken any of her medication this morning. This morning she woke up with moderate itching and whelps to her entire body excluding her face. She's undergoing increased stress with her husbands recent diagnosis of Parkinson's Disease.  She did notice temporary improvement when taking Prednisone, but the next morning her symptoms returned. She is now on day 3 of her taper. Denies shortness of breath, closure to her throat.   Review of Systems  Constitutional: Negative for fever and chills.  HENT: Negative for trouble swallowing and voice change.   Respiratory: Negative for shortness of breath and wheezing.   Skin: Positive for rash.       Past Medical History  Diagnosis Date  . GERD (gastroesophageal reflux disease)   . CAD (coronary artery disease)     a. s/p tandem Promus DES to LAD in 2009 by Dr. Olevia Perches;  b.  LHC (03/22/14):  prox LAD 30%, mid LAD 40%, LAD stent ok with dist 20% ISR, apical LAD occluded with L-L collats filling apical vessel (too small for PCI), mid RCA 20%, EF 70%.  Med Rx  . Urticaria   . Hyperlipidemia     intol of statins  . Hypertension   . Allergy     Cipro, Plavix, Statins  . Personal history of colonic polyps   . Diabetes mellitus     Diagnosed 2012  . NHL (non-Hodgkin's lymphoma) ([place]) dx'd 2003    chemo/xrt comp 2003  .  Proctitis   . Diverticulosis of colon (without mention of hemorrhage)   . Hx of echocardiogram     a. Echo (07/2013):  Mild LVH, vigorous LVF, EF 65-70%, Gr 1 DD, mild MR, mild to mod LAE  . Hx of cardiovascular stress test     a. Nuclear (08/2013):  No ischemia, EF 80%, Normal     Social History   Social History  . Marital Status: Married    Spouse Name: N/A  . Number of Children: N/A  . Years of Education: N/A   Occupational History  . Retired    Social History Main Topics  . Smoking status: Never Smoker   . Smokeless tobacco: Never Used  . Alcohol Use: No  . Drug Use: No  . Sexual Activity: No   Other Topics Concern  . Not on file   Social History Narrative    Past Surgical History  Procedure Laterality Date  . Vaginal hysterectomy      partial, fibroids, one ovary left  . Knee surgery  10/2003    left  . Coronary angioplasty with stent placement      x 2  . Dexa-neg    . Cardiac cath-neg    . Laser surgery for cataracts-left    . Stress cardiolite    . Left heart catheterization  with coronary angiogram N/A 03/22/2014    Procedure: LEFT HEART CATHETERIZATION WITH CORONARY ANGIOGRAM;  Surgeon: Burnell Blanks, MD;  Location: [facility] CATH LAB;  Service: Cardiovascular;  Laterality: N/A;    Family History  Problem Relation Age of Onset  . Cancer Mother     jaw  . Hypertension Father   . Heart attack Father   . Colon cancer Neg Hx   . Heart attack Sister     Allergies  Allergen Reactions  . Shellfish Allergy Anaphylaxis and Rash  . Cephalexin     REACTION: trash and swelling REACTION: trash and swelling  . Ciprofloxacin     REACTION: u/k  . Clopidogrel Bisulfate     REACTION: swelling, rash  . Codeine Phosphate     REACTION: unspecified  . Ezetimibe-Simvastatin     REACTION: myalgias REACTION: myalgias  . Hydrocod Polst-Cpm Polst Er     REACTION: u/k  . Lidocaine Hives    ONLY TO ADHESIVE LIDOCAINE PATCHES. NO PROBLEM WITH INJECTABLE  LIDOCAINE.  Marland Kitchen Nitrofurantoin     REACTION: [place] REACTION: [place]  . Pregabalin     REACTION: felt bad REACTION: felt bad  . Propoxyphene N-Acetaminophen     REACTION: [place]  . Rofecoxib     unk unk   . Sulfa Antibiotics     REACTION: unspecified  . Sulfamethoxazole     REACTION: unspecified  . Sulfonamide Derivatives     REACTION: unspecified  . Celecoxib Rash    REACTION: [place]  . Codeine Rash    REACTION: [place]  . Hydrocod Polst-Cpm Polst Er Rash  . Other Rash  . Propoxyphene Rash    Current Outpatient Prescriptions on File Prior to Visit  Medication Sig Dispense Refill  . aspirin (ASPIRIN LOW DOSE) 81 MG tablet Take 81 mg by mouth daily.      . Blood Glucose Monitoring Suppl ([place]) w/Device KIT 1 Device by Does not apply route once. 1 kit 0  . Cholecalciferol (VITAMIN D-3) 1000 UNITS CAPS Take 1 capsule by mouth daily.    . COMBIGAN 0.2-0.5 % ophthalmic solution 1 drop every 12 (twelve) hours.     . CRANBERRY PO Take 1 tablet by mouth daily.    Marland Kitchen ezetimibe (ZETIA) 10 MG tablet Take 1 tablet (10 mg total) by mouth daily. 30 tablet 11  . glipiZIDE (GLUCOTROL) 5 MG tablet TAKE ONE TABLET BY MOUTH TWICE DAILY 60 tablet 3  . glucose blood (ONETOUCH VERIO) test strip 1 each by Other route 2 (two) times daily. Use as instructed 100 each 3  . isosorbide mononitrate (IMDUR) 30 MG 24 hr tablet Take 1 tablet (30 mg total) by mouth daily. 30 tablet 11  . lansoprazole (PREVACID) 15 MG capsule TAKE ONE CAPSULE BY MOUTH EVERY DAY 30 capsule 11  . latanoprost (XALATAN) 0.005 % ophthalmic solution Place 1 drop into both eyes at bedtime.     Marland Kitchen losartan (COZAAR) 50 MG tablet TAKE ONE TABLET BY MOUTH EVERY DAY 30 tablet 11  . metoprolol succinate (TOPROL-XL) 100 MG 24 hr tablet TAKE ONE TABLET BY MOUTH EVERY MORNING 30 tablet 6  . Multiple Vitamins-Minerals (EYE VITAMINS PO) Take 1 tablet by mouth.    . nitroGLYCERIN (NITROSTAT) 0.4 MG SL tablet Place 1 tablet (0.4 mg total) under  the tongue every 5 (five) minutes as needed for chest pain. 30 tablet 3  . ONE TOUCH LANCETS MISC 1 each by Does not apply route 2 (two) times daily.    Marland Kitchen  vitamin B-12 (CYANOCOBALAMIN) 500 MCG tablet Take 1,000 mcg by mouth daily.     . [DISCONTINUED] loratadine (CLARITIN) 10 MG tablet Take 10 mg by mouth as needed.      No current facility-administered medications on file prior to visit.    BP 124/76 mmHg  Pulse 63  Temp(Src) 97.3 F (36.3 C) (Oral)  Ht _0  (1.702 m)  Wt 178 lb 12.8 oz (81.103 kg)  BMI 28.00 kg/m2  SpO2 96%    Objective:   Physical Exam  Constitutional: She appears well-nourished.  Cardiovascular: Normal rate and regular rhythm.   Pulmonary/Chest: Effort normal and breath sounds normal.  Skin: Skin is warm and dry.  Moderate, wide spread rash with raised whelps. Intact. Appear uncomfortable.  Psychiatric: She has a normal mood and affect.          Assessment & Plan:  Rash/Hives:  Located to entire body since 05/23. Temporary improvement on low dose prednisone taper. Complete negative work up in the past in regards to hives. Suspect this is due to increased stress from husbands recent diagnosis of Parkinson's and his moderate decline.  No signs of anaphylaxis. No wheezing, shortness of breath. Clear lungs.    Will increase prednisone as she is on a low dose taper. Continue benadryl as this does help and does not cause drowsiness. Discussed ways to decrease stress, she will be traveling to the beach this weekend. Also recommended she follow up with PCP in regards to anxiety and depression, she verbalized understanding.

## 2016-05-04 ENCOUNTER — Ambulatory Visit (INDEPENDENT_AMBULATORY_CARE_PROVIDER_SITE_OTHER): Payer: Medicare Other | Admitting: Family Medicine

## 2016-05-04 ENCOUNTER — Encounter: Payer: Self-pay | Admitting: Family Medicine

## 2016-05-04 VITALS — BP 114/70 | HR 61 | Temp 97.9°F | Wt 174.5 lb

## 2016-05-04 DIAGNOSIS — Z6379 Other stressful life events affecting family and household: Secondary | ICD-10-CM | POA: Diagnosis not present

## 2016-05-04 DIAGNOSIS — E0821 Diabetes mellitus due to underlying condition with diabetic nephropathy: Secondary | ICD-10-CM | POA: Diagnosis not present

## 2016-05-04 DIAGNOSIS — L509 Urticaria, unspecified: Secondary | ICD-10-CM | POA: Insufficient documentation

## 2016-05-04 DIAGNOSIS — F32A Depression, unspecified: Secondary | ICD-10-CM | POA: Insufficient documentation

## 2016-05-04 LAB — COMPREHENSIVE METABOLIC PANEL
ALBUMIN: 3.9 g/dL (ref 3.5–5.2)
ALT: 14 U/L (ref 0–35)
AST: 14 U/L (ref 0–37)
Alkaline Phosphatase: 42 U/L (ref 39–117)
BUN: 29 mg/dL — ABNORMAL HIGH (ref 6–23)
CALCIUM: 9.3 mg/dL (ref 8.4–10.5)
CHLORIDE: 103 meq/L (ref 96–112)
CO2: 32 meq/L (ref 19–32)
CREATININE: 1.21 mg/dL — AB (ref 0.40–1.20)
GFR: 45.25 mL/min — AB (ref >60.00)
Glucose, Bld: 89 mg/dL (ref 70–99)
POTASSIUM: 5 meq/L (ref 3.5–5.1)
Sodium: 139 mEq/L (ref 135–145)
Total Bilirubin: 0.7 mg/dL (ref 0.2–1.2)
Total Protein: 6.3 g/dL (ref 6.0–8.3)

## 2016-05-04 LAB — HEMOGLOBIN A1C: Hgb A1c MFr Bld: 7.2 % — ABNORMAL HIGH (ref 4.6–6.5)

## 2016-05-04 MED ORDER — HYDROXYZINE PAMOATE 25 MG PO CAPS: 25.0000 mg | ORAL_CAPSULE | Freq: Three times a day (TID) | ORAL | Status: DC | PRN

## 2016-05-04 NOTE — Patient Instructions (Signed)
Good to see you. Have fun at the beach. We are starting Vistaril 25 mg three times daily as needed.

## 2016-05-04 NOTE — Progress Notes (Signed)
Subjective:    Patient ID: [name] [name], female    DOB: 1932/08/18, 82 y.o.   MRN: [id number]  HPI  [name] [name] is an 82 year old pleasant female, well known to me,  who presents today with a chief complaint of rash and anxiety.   She was evaluated on 05/23 with a chief complaint of rash that had began that day by [name] [name], note reviewed.  She's had prior extensive testing for evaluation of hives/rash in the past which was unremarkable.  She was instructed to take benadryl twice daily and also provided with IM Depo Medrol. She called back the next day with complaints of hives and was prescribed a prednisone taper.   Was seen again in the office on 5/26 by [name] [name] for acute onset that morning of itching and whelps to her entire body excluding her face. Note reviewed.  She's undergoing increased stress with her husbands recent diagnosis of Parkinson's Disease.  She did notice temporary improvement when taking Prednisone, and prednisone taper duration was increased, advised to continue benadryl and to follow up with me here today.  Last dose of Prednisone on 6/2.  Still having recurrent rashes.  Did stop taking one of her eye drops and she does feel symptoms have been a little better since. Her eye doctor is aware.   Review of Systems  Constitutional: Negative for chills.  HENT: Negative for trouble swallowing and voice change.   Respiratory: Negative for wheezing.   Skin: Positive for rash.  Psychiatric/Behavioral: Negative for suicidal ideas, sleep disturbance and self-injury. The patient is nervous/anxious.        Past Medical History  Diagnosis Date  . GERD (gastroesophageal reflux disease)   . CAD (coronary artery disease)     a. s/p tandem Promus DES to LAD in 2009 by Dr. Olevia Perches;  b.  LHC (03/22/14):  prox LAD 30%, mid LAD 40%, LAD stent ok with dist 20% ISR, apical LAD occluded with L-L collats filling apical vessel (too small for PCI), mid RCA 20%, EF 70%.  Med Rx  .  Urticaria   . Hyperlipidemia     intol of statins  . Hypertension   . Allergy     Cipro, Plavix, Statins  . Personal history of colonic polyps   . Diabetes mellitus     Diagnosed 2012  . NHL (non-Hodgkin's lymphoma) ([place]) dx'd 2003    chemo/xrt comp 2003  . Proctitis   . Diverticulosis of colon (without mention of hemorrhage)   . Hx of echocardiogram     a. Echo (07/2013):  Mild LVH, vigorous LVF, EF 65-70%, Gr 1 DD, mild MR, mild to mod LAE  . Hx of cardiovascular stress test     a. Nuclear (08/2013):  No ischemia, EF 80%, Normal     Social History   Social History  . Marital Status: Married    Spouse Name: N/A  . Number of Children: N/A  . Years of Education: N/A   Occupational History  . Retired    Social History Main Topics  . Smoking status: Never Smoker   . Smokeless tobacco: Never Used  . Alcohol Use: No  . Drug Use: No  . Sexual Activity: No   Other Topics Concern  . Not on file   Social History Narrative    Past Surgical History  Procedure Laterality Date  . Vaginal hysterectomy      partial, fibroids, one ovary left  . Knee surgery  10/2003  left  . Coronary angioplasty with stent placement      x 2  . Dexa-neg    . Cardiac cath-neg    . Laser surgery for cataracts-left    . Stress cardiolite    . Left heart catheterization with coronary angiogram N/A 03/22/2014    Procedure: LEFT HEART CATHETERIZATION WITH CORONARY ANGIOGRAM;  Surgeon: Burnell Blanks, MD;  Location: [facility] CATH LAB;  Service: Cardiovascular;  Laterality: N/A;    Family History  Problem Relation Age of Onset  . Cancer Mother     jaw  . Hypertension Father   . Heart attack Father   . Colon cancer Neg Hx   . Heart attack Sister     Allergies  Allergen Reactions  . Shellfish Allergy Anaphylaxis and Rash  . Cephalexin     REACTION: trash and swelling REACTION: trash and swelling  . Ciprofloxacin     REACTION: u/k  . Clopidogrel Bisulfate     REACTION: swelling,  rash  . Codeine Phosphate     REACTION: unspecified  . Ezetimibe-Simvastatin     REACTION: myalgias REACTION: myalgias  . Hydrocod Polst-Cpm Polst Er     REACTION: u/k  . Lidocaine Hives    ONLY TO ADHESIVE LIDOCAINE PATCHES. NO PROBLEM WITH INJECTABLE LIDOCAINE.  Marland Kitchen Nitrofurantoin     REACTION: [place] REACTION: [place]  . Pregabalin     REACTION: felt bad REACTION: felt bad  . Propoxyphene N-Acetaminophen     REACTION: [place]  . Rofecoxib     unk unk   . Sulfa Antibiotics     REACTION: unspecified  . Sulfamethoxazole     REACTION: unspecified  . Sulfonamide Derivatives     REACTION: unspecified  . Celecoxib Rash    REACTION: [place]  . Codeine Rash    REACTION: [place]  . Hydrocod Polst-Cpm Polst Er Rash  . Other Rash  . Propoxyphene Rash    Current Outpatient Prescriptions on File Prior to Visit  Medication Sig Dispense Refill  . aspirin (ASPIRIN LOW DOSE) 81 MG tablet Take 81 mg by mouth daily.      . Blood Glucose Monitoring Suppl ([place]) w/Device KIT 1 Device by Does not apply route once. 1 kit 0  . Cholecalciferol (VITAMIN D-3) 1000 UNITS CAPS Take 1 capsule by mouth daily.    . COMBIGAN 0.2-0.5 % ophthalmic solution 1 drop every 12 (twelve) hours.     . CRANBERRY PO Take 1 tablet by mouth daily.    Marland Kitchen ezetimibe (ZETIA) 10 MG tablet Take 1 tablet (10 mg total) by mouth daily. 30 tablet 11  . glipiZIDE (GLUCOTROL) 5 MG tablet TAKE ONE TABLET BY MOUTH TWICE DAILY 60 tablet 3  . glucose blood (ONETOUCH VERIO) test strip 1 each by Other route 2 (two) times daily. Use as instructed 100 each 3  . isosorbide mononitrate (IMDUR) 30 MG 24 hr tablet Take 1 tablet (30 mg total) by mouth daily. 30 tablet 11  . lansoprazole (PREVACID) 15 MG capsule TAKE ONE CAPSULE BY MOUTH EVERY DAY 30 capsule 11  . latanoprost (XALATAN) 0.005 % ophthalmic solution Place 1 drop into both eyes at bedtime.     Marland Kitchen losartan (COZAAR) 50 MG tablet TAKE ONE TABLET BY MOUTH EVERY DAY 30 tablet 11  .  metoprolol succinate (TOPROL-XL) 100 MG 24 hr tablet TAKE ONE TABLET BY MOUTH EVERY MORNING 30 tablet 6  . Multiple Vitamins-Minerals (EYE VITAMINS PO) Take 1 tablet by mouth.    Marland Kitchen  nitroGLYCERIN (NITROSTAT) 0.4 MG SL tablet Place 1 tablet (0.4 mg total) under the tongue every 5 (five) minutes as needed for chest pain. 30 tablet 3  . ONE TOUCH LANCETS MISC 1 each by Does not apply route 2 (two) times daily.    . predniSONE (DELTASONE) 20 MG tablet Take 3 tablets for 3 days, then 2 tablets for 3 days, then 1 tablet for 3 days. 18 tablet 0  . triamcinolone cream (KENALOG) 0.1 % Apply 1 application topically 2 (two) times daily. 80 g 0  . vitamin B-12 (CYANOCOBALAMIN) 500 MCG tablet Take 1,000 mcg by mouth daily.     . [DISCONTINUED] loratadine (CLARITIN) 10 MG tablet Take 10 mg by mouth as needed.      No current facility-administered medications on file prior to visit.    BP 114/70 mmHg  Pulse 61  Temp(Src) 97.9 F (36.6 C) (Oral)  Wt 174 lb 8 oz (79.153 kg)  SpO2 97%    Objective:   Physical Exam  Constitutional: She appears well-nourished.  Cardiovascular: Normal rate and regular rhythm.   Pulmonary/Chest: Effort normal and breath sounds normal.  Skin: Skin is warm and dry.  No rashes noted today.  Psychiatric: She has a normal mood and affect.          Assessment & Plan:

## 2016-05-04 NOTE — Progress Notes (Signed)
Pre visit review using our clinic review tool, if applicable. No additional management support is needed unless otherwise documented below in the visit note. 

## 2016-05-04 NOTE — Assessment & Plan Note (Signed)
>  25 minutes spent in face to face time with patient, >50% spent in counselling or coordination of care Agree likely related to worsening anxiety. Will try vistaril 25 mg three times daily prn. The patient indicates understanding of these issues and agrees with the plan.

## 2016-05-05 ENCOUNTER — Encounter: Payer: Self-pay | Admitting: *Deleted

## 2016-05-06 ENCOUNTER — Other Ambulatory Visit: Payer: Self-pay | Admitting: *Deleted

## 2016-05-06 MED ORDER — GLIPIZIDE 5 MG PO TABS: 5.0000 mg | ORAL_TABLET | Freq: Two times a day (BID) | ORAL | Status: DC

## 2016-05-18 ENCOUNTER — Ambulatory Visit (INDEPENDENT_AMBULATORY_CARE_PROVIDER_SITE_OTHER): Payer: Medicare Other | Admitting: Family Medicine

## 2016-05-18 ENCOUNTER — Ambulatory Visit (INDEPENDENT_AMBULATORY_CARE_PROVIDER_SITE_OTHER)
Admission: RE | Admit: 2016-05-18 | Discharge: 2016-05-18 | Disposition: A | Discharge status: Home / Self Care | Payer: Medicare Other | Source: Ambulatory Visit | Attending: Family Medicine | Admitting: Family Medicine

## 2016-05-18 ENCOUNTER — Encounter: Payer: Self-pay | Admitting: Family Medicine

## 2016-05-18 VITALS — BP 114/60 | HR 77 | Temp 98.4°F | Ht 67.0 in | Wt 179.5 lb

## 2016-05-18 DIAGNOSIS — M7062 Trochanteric bursitis, left hip: Secondary | ICD-10-CM

## 2016-05-18 DIAGNOSIS — M79602 Pain in left arm: Secondary | ICD-10-CM | POA: Diagnosis not present

## 2016-05-18 DIAGNOSIS — M79605 Pain in left leg: Secondary | ICD-10-CM

## 2016-05-18 NOTE — Progress Notes (Signed)
Dr. Frederico Hamman T. Aleene Swanner, MD, [facility] [place], [address] Phone: [phone] Fax: [phone]  05/18/2016  Patient: [name] [name], MRN: [id number], DOB: May 20, 1932, 82 y.o.  Primary Physician:  Arnette Norris, MD   Chief Complaint  Patient presents with  . Hip Pain    Left   Subjective:   [name] [name] is a 82 y.o. very pleasant female patient who presents with the following:  Trouble putting pressure on her left leg.  [place] trip.   Pain laterally, with starting to walk, pain will be really bad. Pain with lifting up her left leg.   The patient has a history of trochanteric bursitis intermittently ongoing for a number of years.  She has had a failure of traditional conservative management.  She went to physical therapy a handful of years ago.  I have reviewed traditional rehabilitation for this for the hip numerous times.  We have done multiple trochanteric  Bursal injections which provided relief for a short amount of time, but then she always has a return of symptoms.  She really has minimal back pain at this point.  No pain in her buttocks and no radicular pain.  03/10/2016 Last OV with Owens Loffler, MD  Buttocks pain on the left side. Radiating down the leg. This is mostly on the lateral, is never on the posterior aspect and never beyond the knee. She has pain in the lateral trochanteric bursal region. Her back doesn't have any pain at all. She occasionally will have some pain on the posterior rim on the left, but it is still around the bursa on the trochanter primarily. Strength is always been good, but she has not been all that diligent with doing any stretching of this region.  L GTB.  GTB inj  Rehab reviewed  02/04/2016 Last OV with Owens Loffler, MD  F/u L hip lateral pain:  GTB. Patient who I remember well who presents with recurrent left-sided lateral hip pain.  She has had recurrent trochanteric  bursitis on the left, and we've injected this a number of times.  She does have some resolution of symptoms the last anywhere from 2 or 3 months up to 6 months or longer, but she has had recurrence over time.  She is not having any significant back pain right now and she is not having any groin pain.  She moves her hip quite well.  Past Medical History, Surgical History, Social History, Family History, Problem List, Medications, and Allergies have been reviewed and updated if relevant.  Patient Active Problem List   Diagnosis Date Noted  . Stress due to illness of family member 05/04/2016  . Hives 05/04/2016  . Dysuria 01/14/2016  . Pain in joints of both feet 01/14/2016  . Osteopenia 08/06/2015  . Constipation 12/30/2014  . B12 deficiency 12/30/2014  . Allergic reaction 09/02/2014  . Arthralgia of right foot 07/18/2014  . Pruritic intertrigo 07/18/2014  . Right hip pain 06/04/2014  . Other malaise and fatigue 06/04/2014  . Elevated LFTs 04/19/2014  . Mitral regurgitation 04/19/2014  . Burning sensation of feet 06/13/2013  . Pes planus 06/13/2013  . Left hip pain 10/02/2012  . Renal insufficiency 06/07/2011  . Diabetes mellitus with renal manifestations, controlled ([place]) 05/31/2011  . MIXED HYPERLIPIDEMIA 08/04/2010  . BEN HTN HEART DISEASE WITHOUT HEART FAIL 08/04/2010  . LUMBAR SPRAIN AND STRAIN 01/15/2010  . VERTIGO 10/29/2009  . OSTEOPENIA 04/29/2009  . MZ LYMPHOMA  UNS SITE EXTRANODAL&SOLID ORGAN SITE 02/25/2009  . EXTERNAL HEMORRHOIDS 02/24/2009  . DIVERTICULOSIS, COLON 02/24/2009  . HEMOPTYSIS 09/02/2008  . BACK PAIN, THORACIC REGION 04/30/2008  . LYMPHOMA, MALT 08/28/2007  . DETACHED RETINA 08/28/2007  . LABILE HYPERTENSION 08/28/2007  . Coronary atherosclerosis 08/28/2007  . MITRAL VALVE PROLAPSE 08/28/2007  . GERD 08/28/2007  . IBS 08/28/2007  . BREAST CYSTS, BILATERAL 08/28/2007  . LOW BACK PAIN, CHRONIC 06/29/2007    Past Medical History  Diagnosis Date  .  GERD (gastroesophageal reflux disease)   . CAD (coronary artery disease)     a. s/p tandem Promus DES to LAD in 2009 by Dr. Olevia Perches;  b.  LHC (03/22/14):  prox LAD 30%, mid LAD 40%, LAD stent ok with dist 20% ISR, apical LAD occluded with L-L collats filling apical vessel (too small for PCI), mid RCA 20%, EF 70%.  Med Rx  . Urticaria   . Hyperlipidemia     intol of statins  . Hypertension   . Allergy     Cipro, Plavix, Statins  . Personal history of colonic polyps   . Diabetes mellitus     Diagnosed 2012  . NHL (non-Hodgkin's lymphoma) ([place]) dx'd 2003    chemo/xrt comp 2003  . Proctitis   . Diverticulosis of colon (without mention of hemorrhage)   . Hx of echocardiogram     a. Echo (07/2013):  Mild LVH, vigorous LVF, EF 65-70%, Gr 1 DD, mild MR, mild to mod LAE  . Hx of cardiovascular stress test     a. Nuclear (08/2013):  No ischemia, EF 80%, Normal    Past Surgical History  Procedure Laterality Date  . Vaginal hysterectomy      partial, fibroids, one ovary left  . Knee surgery  10/2003    left  . Coronary angioplasty with stent placement      x 2  . Dexa-neg    . Cardiac cath-neg    . Laser surgery for cataracts-left    . Stress cardiolite    . Left heart catheterization with coronary angiogram N/A 03/22/2014    Procedure: LEFT HEART CATHETERIZATION WITH CORONARY ANGIOGRAM;  Surgeon: Burnell Blanks, MD;  Location[facility] CATH LAB;  Service: Cardiovascular;  Laterality: N/A;    Social History   Social History  . Marital Status: Married    Spouse Name: N/A  . Number of Children: N/A  . Years of Education: N/A   Occupational History  . Retired    Social History Main Topics  . Smoking status: Never Smoker   . Smokeless tobacco: Never Used  . Alcohol Use: No  . Drug Use: No  . Sexual Activity: No   Other Topics Concern  . Not on file   Social History Narrative    Family History  Problem Relation Age of Onset  . Cancer Mother     jaw  . Hypertension  Father   . Heart attack Father   . Colon cancer Neg Hx   . Heart attack Sister     Allergies  Allergen Reactions  . Shellfish Allergy Anaphylaxis and Rash  . Cephalexin     REACTION: trash and swelling REACTION: trash and swelling  . Ciprofloxacin     REACTION: u/k  . Clopidogrel Bisulfate     REACTION: swelling, rash  . Codeine Phosphate     REACTION: unspecified  . Ezetimibe-Simvastatin     REACTION: myalgias REACTION: myalgias  . Hydrocod Polst-Cpm Polst Er     REACTION:  u/k  . Lidocaine Hives    ONLY TO ADHESIVE LIDOCAINE PATCHES. NO PROBLEM WITH INJECTABLE LIDOCAINE.  Marland Kitchen Nitrofurantoin     REACTION: [place] REACTION: [place]  . Pregabalin     REACTION: felt bad REACTION: felt bad  . Propoxyphene N-Acetaminophen     REACTION: [place]  . Rofecoxib     unk unk   . Sulfa Antibiotics     REACTION: unspecified  . Sulfamethoxazole     REACTION: unspecified  . Sulfonamide Derivatives     REACTION: unspecified  . Celecoxib Rash    REACTION: [place]  . Codeine Rash    REACTION: [place]  . Hydrocod Polst-Cpm Polst Er Rash  . Other Rash  . Propoxyphene Rash    Medication list reviewed and updated in full in [place].  GEN: No fevers, chills. Nontoxic. Primarily MSK c/o today. MSK: Detailed in the HPI GI: tolerating PO intake without difficulty Neuro: No numbness, parasthesias, or tingling associated. Otherwise the pertinent positives of the ROS are noted above.   Objective:   BP 114/60 mmHg  Pulse 77  Temp(Src) 98.4 F (36.9 C) (Oral)  Ht 5' 7"  (1.702 m)  Wt 179 lb 8 oz (81.421 kg)  BMI 28.11 kg/m2   GEN: WDWN, NAD, Non-toxic, Alert & Oriented x 3 HEENT: Atraumatic, Normocephalic.  Ears and Nose: No external deformity. EXTR: No clubbing/cyanosis/edema NEURO: Normal gait.  PSYCH: Normally interactive. Conversant. Not depressed or anxious appearing.  Calm demeanor.   HIP EXAM: SIDE: L ROM: Abduction, Flexion, Internal and External range of motion: full Pain with  terminal IROM and EROM: no GTB: ttp on the L lateral SLR: NEG Knees: No effusion FABER: NT REVERSE FABER: NT, neg Piriformis: NT at direct palpation Str: flexion: 4/5 abduction: 3+/5 adduction: 4/5 Strength testing non-tender  Radiology: Dg Lumbar Spine Complete  05/18/2016  CLINICAL DATA:  Trochanteric bursitis of the left hip. Left leg pain. EXAM: LUMBAR SPINE - COMPLETE 4+ VIEW COMPARISON:  CT 06/22/2012 FINDINGS: Diffuse degenerative disc and facet disease throughout the lumbar spine. 8 mm anterolisthesis of L5 on S1, stable since prior CT. Bilateral L5 pars defects noted. No acute fracture. SI joints are symmetric and unremarkable. IMPRESSION: Degenerative disc and facet disease. Bilateral L5 pars defects with grade 1 anterolisthesis. No acute findings. Electronically Signed   By: Rolm Baptise M.D.   On: 05/18/2016 11:59   Dg Hip Unilat With Pelvis 2-3 Views Left  05/18/2016  CLINICAL DATA:  Trochanteric bursitis of the left hip. Left leg pain. EXAM: DG HIP (WITH OR WITHOUT PELVIS) 2-3V LEFT COMPARISON:  None. FINDINGS: Hip joints are symmetric and unremarkable. SI joints unremarkable. No acute bony abnormality. Specifically, no fracture, subluxation, or dislocation. Soft tissues are intact. IMPRESSION: No acute bony abnormality. Electronically Signed   By: Rolm Baptise M.D.   On: 05/18/2016 12:00    Assessment and Plan:   Trochanteric bursitis of left hip - Plan: DG Lumbar Spine Complete, DG HIP UNILAT WITH PELVIS 2-3 VIEWS LEFT, Ambulatory referral to Physical Therapy  Leg pain, lateral, left - Plan: DG Lumbar Spine Complete, DG HIP UNILAT WITH PELVIS 2-3 VIEWS LEFT, Ambulatory referral to Physical Therapy  >25 minutes spent in face to face time with patient, >50% spent in counselling or coordination of care  Refractory left-sided trochanteric bursitis.  Failure of conservative management for years.  Continued pelvic weakness.  Recommended formal physical therapy for core and hip  stability.  Discussed with patient at this point given her multiyear failure  of conservative management, consideration of bursectomy would be reasonable at this point, but she is not interested in operative management.  Follow-up: No Follow-up on file.  Orders Placed This Encounter  Procedures  . DG Lumbar Spine Complete  . DG HIP UNILAT WITH PELVIS 2-3 VIEWS LEFT  . Ambulatory referral to Physical Therapy    Signed,  Frederico Hamman T. Jaun Galluzzo, MD   Patient's Medications  New Prescriptions   No medications on file  Previous Medications   ASPIRIN (ASPIRIN LOW DOSE) 81 MG TABLET    Take 81 mg by mouth daily.     BLOOD GLUCOSE MONITORING SUPPL (ONETOUCH VERIO FLEX SYSTEM) W/DEVICE KIT    1 Device by Does not apply route once.   CHOLECALCIFEROL (VITAMIN D-3) 1000 UNITS CAPS    Take 1 capsule by mouth daily.   CRANBERRY PO    Take 1 tablet by mouth daily.   EZETIMIBE (ZETIA) 10 MG TABLET    Take 1 tablet (10 mg total) by mouth daily.   GLIPIZIDE (GLUCOTROL) 5 MG TABLET    Take 1 tablet (5 mg total) by mouth 2 (two) times daily.   GLUCOSE BLOOD (ONETOUCH VERIO) TEST STRIP    1 each by Other route 2 (two) times daily. Use as instructed   HYDROXYZINE (VISTARIL) 25 MG CAPSULE    Take 1 capsule (25 mg total) by mouth 3 (three) times daily as needed for anxiety or itching.   ISOSORBIDE MONONITRATE (IMDUR) 30 MG 24 HR TABLET    Take 1 tablet (30 mg total) by mouth daily.   LANSOPRAZOLE (PREVACID) 15 MG CAPSULE    TAKE ONE CAPSULE BY MOUTH EVERY DAY   LATANOPROST (XALATAN) 0.005 % OPHTHALMIC SOLUTION    Place 1 drop into both eyes at bedtime.    LOSARTAN (COZAAR) 50 MG TABLET    TAKE ONE TABLET BY MOUTH EVERY DAY   METOPROLOL SUCCINATE (TOPROL-XL) 100 MG 24 HR TABLET    TAKE ONE TABLET BY MOUTH EVERY MORNING   NITROGLYCERIN (NITROSTAT) 0.4 MG SL TABLET    Place 1 tablet (0.4 mg total) under the tongue every 5 (five) minutes as needed for chest pain.   ONE TOUCH LANCETS MISC    1 each by Does not  apply route 2 (two) times daily.   TRIAMCINOLONE CREAM (KENALOG) 0.1 %    Apply 1 application topically 2 (two) times daily.   VITAMIN B-12 (CYANOCOBALAMIN) 500 MCG TABLET    Take 1,000 mcg by mouth daily.   Modified Medications   No medications on file  Discontinued Medications   COMBIGAN 0.2-0.5 % OPHTHALMIC SOLUTION    1 drop every 12 (twelve) hours.    MULTIPLE VITAMINS-MINERALS (EYE VITAMINS PO)    Take 1 tablet by mouth.   PREDNISONE (DELTASONE) 20 MG TABLET    Take 3 tablets for 3 days, then 2 tablets for 3 days, then 1 tablet for 3 days.

## 2016-05-18 NOTE — Progress Notes (Signed)
Pre visit review using our clinic review tool, if applicable. No additional management support is needed unless otherwise documented below in the visit note. 

## 2016-05-18 NOTE — Patient Instructions (Signed)

## 2016-06-03 ENCOUNTER — Other Ambulatory Visit: Payer: Self-pay | Admitting: Cardiovascular Disease

## 2016-06-30 ENCOUNTER — Other Ambulatory Visit: Payer: Self-pay | Admitting: Cardiovascular Disease

## 2016-07-03 ENCOUNTER — Other Ambulatory Visit: Payer: Self-pay | Admitting: Cardiovascular Disease

## 2016-07-05 ENCOUNTER — Ambulatory Visit (INDEPENDENT_AMBULATORY_CARE_PROVIDER_SITE_OTHER): Payer: Medicare Other | Admitting: Family Medicine

## 2016-07-05 ENCOUNTER — Ambulatory Visit (INDEPENDENT_AMBULATORY_CARE_PROVIDER_SITE_OTHER)
Admission: RE | Admit: 2016-07-05 | Discharge: 2016-07-05 | Disposition: A | Discharge status: Home / Self Care | Payer: Medicare Other | Source: Ambulatory Visit | Attending: Family Medicine | Admitting: Family Medicine

## 2016-07-05 ENCOUNTER — Encounter: Payer: Self-pay | Admitting: Family Medicine

## 2016-07-05 DIAGNOSIS — M79605 Pain in left leg: Secondary | ICD-10-CM | POA: Insufficient documentation

## 2016-07-05 DIAGNOSIS — S99921A Unspecified injury of right foot, initial encounter: Secondary | ICD-10-CM

## 2016-07-05 NOTE — Patient Instructions (Signed)
Good to see you.  Please stop by to see Rosaria Ferries on your way out.  We will call you with your foot xray results.

## 2016-07-05 NOTE — Progress Notes (Signed)
Subjective:   Patient ID: [name] [name], female    DOB: [birth year], 82 y.o.   MRN: [id number]  [name] [name] is a pleasant 82 y.o. year old female who presents to clinic today with Leg Pain (left) and Foot Pain  on 07/05/2016  HPI:   Has been seeing Dr. [name] for left hip trochanteric bursitis.  Has received several steroid injections which have helped initially but now the pain radiating down her left leg and foot are becoming unbearable.  A couple of weeks ago, stepped off a curb and twisted her left foot.  Lateral left foot has been tender to palpation and with weight bearing.    Current Outpatient Prescriptions on File Prior to Visit  Medication Sig Dispense Refill  . aspirin (ASPIRIN LOW DOSE) 81 MG tablet Take 81 mg by mouth daily.      . Blood Glucose Monitoring Suppl (Ruth) w/Device KIT 1 Device by Does not apply route once. 1 kit 0  . Cholecalciferol (VITAMIN D-3) 1000 UNITS CAPS Take 1 capsule by mouth daily.    Marland Kitchen CRANBERRY PO Take 1 tablet by mouth daily.    Marland Kitchen ezetimibe (ZETIA) 10 MG tablet TAKE ONE TABLET BY MOUTH EVERY DAY 30 tablet 6  . glipiZIDE (GLUCOTROL) 5 MG tablet Take 1 tablet (5 mg total) by mouth 2 (two) times daily. 90 tablet 1  . glucose blood (ONETOUCH VERIO) test strip 1 each by Other route 2 (two) times daily. Use as instructed 100 each 3  . hydrOXYzine (VISTARIL) 25 MG capsule Take 1 capsule (25 mg total) by mouth 3 (three) times daily as needed for anxiety or itching. 90 capsule 0  . isosorbide mononitrate (IMDUR) 30 MG 24 hr tablet Take 1 tablet (30 mg total) by mouth daily. 30 tablet 11  . lansoprazole (PREVACID) 15 MG capsule TAKE ONE CAPSULE BY MOUTH EVERY DAY 30 capsule 11  . latanoprost (XALATAN) 0.005 % ophthalmic solution Place 1 drop into both eyes at bedtime.     Marland Kitchen losartan (COZAAR) 50 MG tablet TAKE ONE TABLET BY MOUTH EVERY DAY 30 tablet 6  . metoprolol succinate (TOPROL-XL) 100 MG 24 hr tablet TAKE ONE TABLET BY MOUTH  EVERY MORNING 30 tablet 7  . nitroGLYCERIN (NITROSTAT) 0.4 MG SL tablet Place 1 tablet (0.4 mg total) under the tongue every 5 (five) minutes as needed for chest pain. 30 tablet 3  . ONE TOUCH LANCETS MISC 1 each by Does not apply route 2 (two) times daily.    Marland Kitchen triamcinolone cream (KENALOG) 0.1 % Apply 1 application topically 2 (two) times daily. 80 g 0  . vitamin B-12 (CYANOCOBALAMIN) 500 MCG tablet Take 1,000 mcg by mouth daily.     . [DISCONTINUED] loratadine (CLARITIN) 10 MG tablet Take 10 mg by mouth as needed.      No current facility-administered medications on file prior to visit.     Allergies  Allergen Reactions  . Shellfish Allergy Anaphylaxis and Rash  . Cephalexin     REACTION: trash and swelling REACTION: trash and swelling  . Ciprofloxacin     REACTION: u/k  . Clopidogrel Bisulfate     REACTION: swelling, rash  . Codeine Phosphate     REACTION: unspecified  . Ezetimibe-Simvastatin     REACTION: myalgias REACTION: myalgias  . Hydrocod Polst-Cpm Polst Er     REACTION: u/k  . Lidocaine Hives    ONLY TO ADHESIVE LIDOCAINE PATCHES. NO PROBLEM WITH INJECTABLE LIDOCAINE.  Marland Kitchen  Nitrofurantoin     REACTION: [place] REACTION: [place]  . Pregabalin     REACTION: felt bad REACTION: felt bad  . Propoxyphene N-Acetaminophen     REACTION: [place]  . Rofecoxib     unk unk   . Sulfa Antibiotics     REACTION: unspecified  . Sulfamethoxazole     REACTION: unspecified  . Sulfonamide Derivatives     REACTION: unspecified  . Celecoxib Rash    REACTION: [place]  . Codeine Rash    REACTION: [place]  . Hydrocod Polst-Cpm Polst Er Rash  . Other Rash  . Propoxyphene Rash    Past Medical History:  Diagnosis Date  . Allergy    Cipro, Plavix, Statins  . CAD (coronary artery disease)    a. s/p tandem Promus DES to LAD in 2009 by Dr. Olevia Perches;  b.  LHC (03/22/14):  prox LAD 30%, mid LAD 40%, LAD stent ok with dist 20% ISR, apical LAD occluded with L-L collats filling apical vessel (too small for PCI),  mid RCA 20%, EF 70%.  Med Rx  . Diabetes mellitus    Diagnosed 2012  . Diverticulosis of colon (without mention of hemorrhage)   . GERD (gastroesophageal reflux disease)   . Hx of cardiovascular stress test    a. Nuclear (08/2013):  No ischemia, EF 80%, Normal  . Hx of echocardiogram    a. Echo (07/2013):  Mild LVH, vigorous LVF, EF 65-70%, Gr 1 DD, mild MR, mild to mod LAE  . Hyperlipidemia    intol of statins  . Hypertension   . NHL (non-Hodgkin's lymphoma) ([place]) dx'd 2003   chemo/xrt comp 2003  . Personal history of colonic polyps   . Proctitis   . Urticaria     Past Surgical History:  Procedure Laterality Date  . cardiac cath-neg    . CORONARY ANGIOPLASTY WITH STENT PLACEMENT     x 2  . dexa-neg    . KNEE SURGERY  10/2003   left  . laser surgery for cataracts-left    . LEFT HEART CATHETERIZATION WITH CORONARY ANGIOGRAM N/A 03/22/2014   Procedure: LEFT HEART CATHETERIZATION WITH CORONARY ANGIOGRAM;  Surgeon: Burnell Blanks, MD;  Location[facility] CATH LAB;  Service: Cardiovascular;  Laterality: N/A;  . stress cardiolite    . VAGINAL HYSTERECTOMY     partial, fibroids, one ovary left    Family History  Problem Relation Age of Onset  . Cancer Mother     jaw  . Hypertension Father   . Heart attack Father   . Colon cancer Neg Hx   . Heart attack Sister     Social History   Social History  . Marital status: Married    Spouse name: N/A  . Number of children: N/A  . Years of education: N/A   Occupational History  . Retired Retired   Social History Main Topics  . Smoking status: Never Smoker  . Smokeless tobacco: Never Used  . Alcohol use No  . Drug use: No  . Sexual activity: No   Other Topics Concern  . Not on file   Social History Narrative  . No narrative on file   The PMH, PSH, Social History, Family History, Medications, and allergies have been reviewed in [facility], and have been updated if relevant.   Review of Systems  Musculoskeletal: Positive  for arthralgias, gait problem and joint swelling.  All other systems reviewed and are negative.      Objective:    BP 134/68  Pulse 69   Temp 98.2 F (36.8 C) (Oral)   Wt 181 lb 4 oz (82.2 kg)   SpO2 95%   BMI 28.39 kg/m    Physical Exam  Constitutional: She is oriented to person, place, and time. She appears well-developed and well-nourished. No distress.  HENT:  Head: Normocephalic.  Eyes: Conjunctivae are normal.  Cardiovascular: Normal rate.   Pulmonary/Chest: Effort normal.  Musculoskeletal:       Right hip: She exhibits tenderness.       Right foot: There is tenderness, bony tenderness and swelling.  Neurological: She is alert and oriented to person, place, and time. No cranial nerve deficit.  Skin: Skin is warm and dry. She is not diaphoretic.  Psychiatric: She has a normal mood and affect. Her behavior is normal. Judgment and thought content normal.  Nursing note and vitals reviewed.         Assessment & Plan:   Left leg pain - Plan: Ambulatory referral to Orthopedic Surgery  Right foot injury, initial encounter - Plan: DG Foot Complete Right No Follow-up on file.

## 2016-07-05 NOTE — Assessment & Plan Note (Addendum)
Failed conservative tx despite Dr. Lillie Fragmin excellent care. Refer to ortho per pt request.

## 2016-07-05 NOTE — Assessment & Plan Note (Signed)
Lateral foot very tender to palpation. Xray today to rule out fx. The patient indicates understanding of these issues and agrees with the plan.

## 2016-07-05 NOTE — Progress Notes (Signed)
Pre visit review using our clinic review tool, if applicable. No additional management support is needed unless otherwise documented below in the visit note. 

## 2016-07-13 ENCOUNTER — Encounter: Payer: Self-pay | Admitting: Sports Medicine

## 2016-07-13 ENCOUNTER — Ambulatory Visit (INDEPENDENT_AMBULATORY_CARE_PROVIDER_SITE_OTHER): Payer: Medicare Other | Admitting: Sports Medicine

## 2016-07-13 DIAGNOSIS — S93401A Sprain of unspecified ligament of right ankle, initial encounter: Secondary | ICD-10-CM

## 2016-07-13 DIAGNOSIS — R600 Localized edema: Secondary | ICD-10-CM

## 2016-07-13 DIAGNOSIS — S92301A Fracture of unspecified metatarsal bone(s), right foot, initial encounter for closed fracture: Secondary | ICD-10-CM

## 2016-07-13 NOTE — Progress Notes (Signed)
Subjective: [name] [name] is a 82 y.o. female patient who presents to office for evaluation of right foot and ankle pain. Patient complains of swelling and pain in the ankle after 1 month of twisting her ankle while getting up to greet a friend at her house. Patient went to PCP 2 weeks ago who got xrays and also saw her Ortho doctor who has been treating her for left hip bursitis; finished steroids for this. Patient reports feels best in tennis shoes. Patient denies any other pedal complaints.   Patient Active Problem List   Diagnosis Date Noted  . Left leg pain 07/05/2016  . Right foot injury 07/05/2016  . Stress due to illness of family member 05/04/2016  . Hives 05/04/2016  . Dysuria 01/14/2016  . Pain in joints of both feet 01/14/2016  . Osteopenia 08/06/2015  . Constipation 12/30/2014  . B12 deficiency 12/30/2014  . Allergic reaction 09/02/2014  . Arthralgia of right foot 07/18/2014  . Pruritic intertrigo 07/18/2014  . Right hip pain 06/04/2014  . Other malaise and fatigue 06/04/2014  . Elevated LFTs 04/19/2014  . Mitral regurgitation 04/19/2014  . Burning sensation of feet 06/13/2013  . Pes planus 06/13/2013  . Left hip pain 10/02/2012  . Renal insufficiency 06/07/2011  . Diabetes mellitus with renal manifestations, controlled ([place]) 05/31/2011  . MIXED HYPERLIPIDEMIA 08/04/2010  . BEN HTN HEART DISEASE WITHOUT HEART FAIL 08/04/2010  . LUMBAR SPRAIN AND STRAIN 01/15/2010  . VERTIGO 10/29/2009  . OSTEOPENIA 04/29/2009  . 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 02/25/2009  . EXTERNAL HEMORRHOIDS 02/24/2009  . DIVERTICULOSIS, COLON 02/24/2009  . HEMOPTYSIS 09/02/2008  . BACK PAIN, THORACIC REGION 04/30/2008  . LYMPHOMA, MALT 08/28/2007  . DETACHED RETINA 08/28/2007  . LABILE HYPERTENSION 08/28/2007  . Coronary atherosclerosis 08/28/2007  . MITRAL VALVE PROLAPSE 08/28/2007  . GERD 08/28/2007  . IBS 08/28/2007  . BREAST CYSTS, BILATERAL 08/28/2007  . LOW BACK  PAIN, CHRONIC 06/29/2007    Current Outpatient Prescriptions on File Prior to Visit  Medication Sig Dispense Refill  . aspirin (ASPIRIN LOW DOSE) 81 MG tablet Take 81 mg by mouth daily.      . Blood Glucose Monitoring Suppl ([place]) w/Device KIT 1 Device by Does not apply route once. 1 kit 0  . Cholecalciferol (VITAMIN D-3) 1000 UNITS CAPS Take 1 capsule by mouth daily.    Marland Kitchen CRANBERRY PO Take 1 tablet by mouth daily.    Marland Kitchen ezetimibe (ZETIA) 10 MG tablet TAKE ONE TABLET BY MOUTH EVERY DAY 30 tablet 6  . glipiZIDE (GLUCOTROL) 5 MG tablet Take 1 tablet (5 mg total) by mouth 2 (two) times daily. 90 tablet 1  . glucose blood (ONETOUCH VERIO) test strip 1 each by Other route 2 (two) times daily. Use as instructed 100 each 3  . hydrOXYzine (VISTARIL) 25 MG capsule Take 1 capsule (25 mg total) by mouth 3 (three) times daily as needed for anxiety or itching. 90 capsule 0  . isosorbide mononitrate (IMDUR) 30 MG 24 hr tablet Take 1 tablet (30 mg total) by mouth daily. 30 tablet 11  . lansoprazole (PREVACID) 15 MG capsule TAKE ONE CAPSULE BY MOUTH EVERY DAY 30 capsule 11  . latanoprost (XALATAN) 0.005 % ophthalmic solution Place 1 drop into both eyes at bedtime.     Marland Kitchen losartan (COZAAR) 50 MG tablet TAKE ONE TABLET BY MOUTH EVERY DAY 30 tablet 6  . metoprolol succinate (TOPROL-XL) 100 MG 24 hr tablet TAKE ONE TABLET BY  MOUTH EVERY MORNING 30 tablet 7  . nitroGLYCERIN (NITROSTAT) 0.4 MG SL tablet Place 1 tablet (0.4 mg total) under the tongue every 5 (five) minutes as needed for chest pain. 30 tablet 3  . ONE TOUCH LANCETS MISC 1 each by Does not apply route 2 (two) times daily.    Marland Kitchen triamcinolone cream (KENALOG) 0.1 % Apply 1 application topically 2 (two) times daily. 80 g 0  . vitamin B-12 (CYANOCOBALAMIN) 500 MCG tablet Take 1,000 mcg by mouth daily.     . [DISCONTINUED] loratadine (CLARITIN) 10 MG tablet Take 10 mg by mouth as needed.      No current facility-administered medications  on file prior to visit.     Allergies  Allergen Reactions  . Shellfish Allergy Anaphylaxis and Rash  . Cephalexin     REACTION: trash and swelling REACTION: trash and swelling  . Ciprofloxacin     REACTION: u/k  . Clopidogrel Bisulfate     REACTION: swelling, rash  . Codeine Phosphate     REACTION: unspecified  . Ezetimibe-Simvastatin     REACTION: myalgias REACTION: myalgias  . Hydrocod Polst-Cpm Polst Er     REACTION: u/k  . Lidocaine Hives    ONLY TO ADHESIVE LIDOCAINE PATCHES. NO PROBLEM WITH INJECTABLE LIDOCAINE.  Marland Kitchen Nitrofurantoin     REACTION: [place] REACTION: [place]  . Pregabalin     REACTION: felt bad REACTION: felt bad  . Propoxyphene N-Acetaminophen     REACTION: [place]  . Rofecoxib     unk unk   . Sulfamethoxazole     REACTION: unspecified  . Sulfonamide Derivatives     REACTION: unspecified  . Celecoxib Rash    REACTION: [place] REACTION: [place]  . Codeine Rash    REACTION: [place] REACTION: [place]  . Hydrocod Polst-Cpm Polst Er Rash  . Other Rash  . Propoxyphene Rash  . Sulfa Antibiotics Rash    REACTION: unspecified REACTION: unspecified    Objective:  General: Alert and oriented x3 in no acute distress  Dermatology: No open lesions bilateral lower extremities, no webspace macerations, no ecchymosis bilateral, all nails x 10 are well manicured.  Vascular: Dorsalis Pedis and Posterior Tibial pedal pulses palpable, Capillary Fill Time 3 seconds,Scant pedal hair growth bilateral, Mild focal edema right ankle, Temperature gradient within normal limits.  Neurology: Gross sensation intact via light touch bilateral, Protective sensation intact with Semmes Weinstein Monofilament to all pedal sites, Position sense intact, vibratory intact bilateral, Deep tendon reflexes within normal limits bilateral, No babinski sign present bilateral. (- )Tinels sign bilateral.   Musculoskeletal: Mild tenderness with palpation at peroneal course on right. Negative talar tilt, Negative tib-fib  stress, No instability. No pain with calf compression bilateral. Range of motion within normal limits with mild guarding on right ankle especially with inversion. Strength within normal limits in all groups bilateral.    Assessment and Plan: Problem List Items Addressed This Visit    None    Visit Diagnoses    Sprain of ankle, right, initial encounter    -  Primary   Avulsion fracture of metatarsal bone of right foot, closed, initial encounter       Localized edema           -Complete examination performed -Previous Xrays reviewed from 07-05-16 -Discussed treatement options for small avulsion fracture with sprain -Recommend stiff sole shoe or tennis shoes; No boot or post op shoe give due to patient spine and hip bursitis issues -Compression anklet to right ankle to wear  as instructed on use -Recommend Motrin, ice, elevation until symptoms improve -Patient to return to office as 4-6 weeks for follow up eval or sooner if condition worsens. Patient may benefit from PT if symptoms persists.   Landis Martins, DPM

## 2016-08-10 ENCOUNTER — Encounter: Payer: Self-pay | Admitting: Podiatry

## 2016-08-10 ENCOUNTER — Ambulatory Visit (INDEPENDENT_AMBULATORY_CARE_PROVIDER_SITE_OTHER): Payer: Medicare Other

## 2016-08-10 ENCOUNTER — Ambulatory Visit (INDEPENDENT_AMBULATORY_CARE_PROVIDER_SITE_OTHER): Payer: Medicare Other | Admitting: Podiatry

## 2016-08-10 VITALS — BP 157/80 | HR 69 | Resp 16

## 2016-08-10 DIAGNOSIS — M79671 Pain in right foot: Secondary | ICD-10-CM | POA: Diagnosis not present

## 2016-08-10 DIAGNOSIS — S93401D Sprain of unspecified ligament of right ankle, subsequent encounter: Secondary | ICD-10-CM

## 2016-08-10 DIAGNOSIS — M25571 Pain in right ankle and joints of right foot: Secondary | ICD-10-CM

## 2016-08-10 DIAGNOSIS — R609 Edema, unspecified: Secondary | ICD-10-CM

## 2016-08-10 DIAGNOSIS — M25473 Effusion, unspecified ankle: Secondary | ICD-10-CM

## 2016-08-10 NOTE — Patient Instructions (Signed)
Ankle Sprain  An ankle sprain is an injury to the strong, fibrous tissues (ligaments) that hold the bones of your ankle joint together.   CAUSES  An ankle sprain is usually caused by a fall or by twisting your ankle. Ankle sprains most commonly occur when you step on the outer edge of your foot, and your ankle turns inward. People who participate in sports are more prone to these types of injuries.   SYMPTOMS    Pain in your ankle. The pain may be present at rest or only when you are trying to stand or walk.   Swelling.   Bruising. Bruising may develop immediately or within 1 to 2 days after your injury.   Difficulty standing or walking, particularly when turning corners or changing directions.  DIAGNOSIS   Your caregiver will ask you details about your injury and perform a physical exam of your ankle to determine if you have an ankle sprain. During the physical exam, your caregiver will press on and apply pressure to specific areas of your foot and ankle. Your caregiver will try to move your ankle in certain ways. An X-ray exam may be done to be sure a bone was not broken or a ligament did not separate from one of the bones in your ankle (avulsion fracture).   TREATMENT   Certain types of braces can help stabilize your ankle. Your caregiver can make a recommendation for this. Your caregiver may recommend the use of medicine for pain. If your sprain is severe, your caregiver may refer you to a surgeon who helps to restore function to parts of your skeletal system (orthopedist) or a physical therapist.  HOME CARE INSTRUCTIONS    Apply ice to your injury for 1-2 days or as directed by your caregiver. Applying ice helps to reduce inflammation and pain.    Put ice in a plastic bag.    Place a towel between your skin and the bag.    Leave the ice on for 15-20 minutes at a time, every 2 hours while you are awake.   Only take over-the-counter or prescription medicines for pain, discomfort, or fever as directed by  your caregiver.   Elevate your injured ankle above the level of your heart as much as possible for 2-3 days.   If your caregiver recommends crutches, use them as instructed. Gradually put weight on the affected ankle. Continue to use crutches or a cane until you can walk without feeling pain in your ankle.   If you have a plaster splint, wear the splint as directed by your caregiver. Do not rest it on anything harder than a pillow for the first 24 hours. Do not put weight on it. Do not get it wet. You may take it off to take a shower or bath.   You may have been given an elastic bandage to wear around your ankle to provide support. If the elastic bandage is too tight (you have numbness or tingling in your foot or your foot becomes cold and blue), adjust the bandage to make it comfortable.   If you have an air splint, you may blow more air into it or let air out to make it more comfortable. You may take your splint off at night and before taking a shower or bath. Wiggle your toes in the splint several times per day to decrease swelling.  SEEK MEDICAL CARE IF:    You have rapidly increasing bruising or swelling.   Your toes feel   extremely cold or you lose feeling in your foot.   Your pain is not relieved with medicine.  SEEK IMMEDIATE MEDICAL CARE IF:   Your toes are numb or blue.   You have severe pain that is increasing.  MAKE SURE YOU:    Understand these instructions.   Will watch your condition.   Will get help right away if you are not doing well or get worse.     This information is not intended to replace advice given to you by your health care provider. Make sure you discuss any questions you have with your health care provider.     Document Released: 11/15/2005 Document Revised: 12/06/2014 Document Reviewed: 11/27/2011  [employer]vier Interactive Patient Education 2016 [employer]

## 2016-08-10 NOTE — Progress Notes (Signed)
Subjective: Patient is a history of a right ankle sprain. She still relates some pain and tenderness to the right ankle. Patient also states that she's been treated by an orthopedic specialist for left hip bursitis. Patient is currently receiving injections into the left hip bursa. Patient has no other complaints at this time.   Objective: Physical Exam General: The patient is alert and oriented x3 in no acute distress.  Dermatology: Skin is warm, dry and supple bilateral lower extremities. Negative for open lesions or macerations.  Vascular: Palpable pedal pulses bilaterally. No edema or erythema noted. Capillary refill within normal limits.  Neurological: Epicritic and protective threshold grossly intact bilaterally.   Musculoskeletal Exam: Pain on palpation overlying the ATF ligament right ankle. Also pain on palpation to the tubercle of the fifth metatarsal right foot. Range of motion within normal limits to all pedal and ankle joints bilateral. Muscle strength 5/5 in all groups bilateral.   Radiographic Exam:    Normal osseous mineralization. Joint spaces preserved. No fracture/dislocation/boney destruction.     Assessment:  Problem List Items Addressed This Visit    None    Visit Diagnoses    Ankle pain, right    -  Primary   Foot pain, right       Relevant Orders   DG Foot Complete Right        Plan of Care:  #1 Patient was evaluated. #2 Smaller compression stockinette was given for the right ankle. The one that she's currently using his 2 loose #3 ankle stabilization brace was dispensed for the right ankle for ankle stabilization. #4 prescription for [facility] pharmacy and temperature compounding cream was prescribed for the patient next #5 patient is to return to clinic in 2 months     Dr. Edrick Kins, [place]

## 2016-09-13 ENCOUNTER — Encounter: Payer: Self-pay | Admitting: Physician Assistant

## 2016-09-14 ENCOUNTER — Ambulatory Visit (INDEPENDENT_AMBULATORY_CARE_PROVIDER_SITE_OTHER): Payer: Medicare Other

## 2016-09-14 ENCOUNTER — Encounter: Payer: Self-pay | Admitting: Physician Assistant

## 2016-09-14 ENCOUNTER — Ambulatory Visit (INDEPENDENT_AMBULATORY_CARE_PROVIDER_SITE_OTHER): Payer: Medicare Other | Admitting: Physician Assistant

## 2016-09-14 VITALS — BP 122/74 | HR 74 | Ht 69.0 in | Wt 178.8 lb

## 2016-09-14 DIAGNOSIS — I10 Essential (primary) hypertension: Secondary | ICD-10-CM

## 2016-09-14 DIAGNOSIS — R0609 Other forms of dyspnea: Secondary | ICD-10-CM

## 2016-09-14 DIAGNOSIS — I25119 Atherosclerotic heart disease of native coronary artery with unspecified angina pectoris: Secondary | ICD-10-CM

## 2016-09-14 DIAGNOSIS — Z23 Encounter for immunization: Secondary | ICD-10-CM

## 2016-09-14 DIAGNOSIS — I208 Other forms of angina pectoris: Secondary | ICD-10-CM | POA: Diagnosis not present

## 2016-09-14 DIAGNOSIS — R0789 Other chest pain: Secondary | ICD-10-CM

## 2016-09-14 MED ORDER — ISOSORBIDE MONONITRATE ER 60 MG PO TB24: 60.0000 mg | ORAL_TABLET | Freq: Every day | ORAL | 11 refills | Status: DC

## 2016-09-14 NOTE — Progress Notes (Signed)
Cardiology Office Note    Date:  09/14/2016   ID:  [name] [name], DOB Oct 22, 1932, MRN [id number]  PCP:  Arnette Norris, MD  Cardiologist: Dr. Angelena Form  No chief complaint on file.   History of Present Illness:  [name] [name] is a 82 y.o. female with history of CADStatus post tandem nonoverlapping DES to the LAD in 2009. Chest pain 2010 with negative Myoview scan. Negative Myoview in 2014, chest pain 2015 repeat cath demonstrated a patent LAD stent but occluded apical LAD with left to left collaterals. This vessel was too small for PCI. She had nonobstructive disease elsewhere. 2-D echo 2014 normal LV size and function, mild MR. She has been intolerant to statins and Plavix in the past., HLD, HTN, non-Hodgkin's lymphoma, DM. ABIs 2016 normal  Last follow-up with Dr.McAlhany 12/2015 . 2-D echo showed normal LV systolic function grade 1 DD with elevated LV filling pressures, trace AI, MR and TR, EF 60-65%.  Last weekend she went to the beach. She was falling asleep and her neck was strained while sitting up. She noticed she had to take a deep breath when she woke up. Also had an episode of chest tightness relieved with NTG that night. Having some dyspnea on exertion carrying something heavy or getting in a hurry. This is new since she was last seen here. No symptoms since she came back from beach.She is under a lot of stress trying to take care of her husband who has Parkinson's and she recently broke her right ankle and is in a soft cast. She's had some right leg swelling because of this that goes down at night.   Past Medical History:  Diagnosis Date  . Allergy    Cipro, Plavix, Statins  . CAD (coronary artery disease)    a. s/p tandem Promus DES to LAD in 2009 by Dr. Olevia Perches;  b.  LHC (03/22/14):  prox LAD 30%, mid LAD 40%, LAD stent ok with dist 20% ISR, apical LAD occluded with L-L collats filling apical vessel (too small for PCI), mid RCA 20%, EF 70%.  Med Rx  . Diabetes mellitus    Diagnosed 2012  . Diverticulosis of colon (without mention of hemorrhage)   . GERD (gastroesophageal reflux disease)   . Hx of cardiovascular stress test    a. Nuclear (08/2013):  No ischemia, EF 80%, Normal  . Hx of echocardiogram    a. Echo (07/2013):  Mild LVH, vigorous LVF, EF 65-70%, Gr 1 DD, mild MR, mild to mod LAE  . Hyperlipidemia    intol of statins  . Hypertension   . NHL (non-Hodgkin's lymphoma) ([place]) dx'd 2003   chemo/xrt comp 2003  . Personal history of colonic polyps   . Proctitis   . Urticaria     Past Surgical History:  Procedure Laterality Date  . cardiac cath-neg    . CORONARY ANGIOPLASTY WITH STENT PLACEMENT     x 2  . dexa-neg    . KNEE SURGERY  10/2003   left  . laser surgery for cataracts-left    . LEFT HEART CATHETERIZATION WITH CORONARY ANGIOGRAM N/A 03/22/2014   Procedure: LEFT HEART CATHETERIZATION WITH CORONARY ANGIOGRAM;  Surgeon: Burnell Blanks, MD;  Location: [facility] CATH LAB;  Service: Cardiovascular;  Laterality: N/A;  . stress cardiolite    . VAGINAL HYSTERECTOMY     partial, fibroids, one ovary left    Current Medications: Outpatient Medications Prior to Visit  Medication Sig Dispense Refill  . aspirin (ASPIRIN  LOW DOSE) 81 MG tablet Take 81 mg by mouth daily.      . Blood Glucose Monitoring Suppl ([place]) w/Device KIT 1 Device by Does not apply route once. 1 kit 0  . Cholecalciferol (VITAMIN D-3) 1000 UNITS CAPS Take 1 capsule by mouth daily.    Marland Kitchen ezetimibe (ZETIA) 10 MG tablet TAKE ONE TABLET BY MOUTH EVERY DAY 30 tablet 6  . glipiZIDE (GLUCOTROL) 5 MG tablet Take 1 tablet (5 mg total) by mouth 2 (two) times daily. 90 tablet 1  . glucose blood (ONETOUCH VERIO) test strip 1 each by Other route 2 (two) times daily. Use as instructed 100 each 3  . hydrOXYzine (VISTARIL) 25 MG capsule Take 1 capsule (25 mg total) by mouth 3 (three) times daily as needed for anxiety or itching. 90 capsule 0  . lansoprazole (PREVACID) 15  MG capsule TAKE ONE CAPSULE BY MOUTH EVERY DAY 30 capsule 11  . latanoprost (XALATAN) 0.005 % ophthalmic solution Place 1 drop into both eyes at bedtime.     Marland Kitchen losartan (COZAAR) 50 MG tablet TAKE ONE TABLET BY MOUTH EVERY DAY 30 tablet 6  . methylPREDNISolone (MEDROL DOSEPAK) 4 MG TBPK tablet     . metoprolol succinate (TOPROL-XL) 100 MG 24 hr tablet TAKE ONE TABLET BY MOUTH EVERY MORNING 30 tablet 7  . miconazole (MICOTIN) 2 % powder Apply topically under breast twice daily.    . nitroGLYCERIN (NITROSTAT) 0.4 MG SL tablet Place 1 tablet (0.4 mg total) under the tongue every 5 (five) minutes as needed for chest pain. 30 tablet 3  . ONE TOUCH LANCETS MISC 1 each by Does not apply route 2 (two) times daily.    Marland Kitchen trimethoprim (TRIMPEX) 100 MG tablet Take 100 mg by mouth 2 (two) times daily as needed (UTI).     . isosorbide mononitrate (IMDUR) 30 MG 24 hr tablet Take 1 tablet (30 mg total) by mouth daily. 30 tablet 11  . brimonidine-timolol (COMBIGAN) 0.2-0.5 % ophthalmic solution 1 drop every 12 (twelve) hours.    . Cranberry 200 MG CAPS Take by mouth.    . CRANBERRY PO Take 1 tablet by mouth daily.    . cyanocobalamin (V-R VITAMIN B-12) 500 MCG tablet Take by mouth.    . desonide (DESOWEN) 0.05 % lotion Apply to inflamed areas under breasts up to twice daily.    Marland Kitchen ezetimibe (ZETIA) 10 MG tablet TAKE ONE TABLET BY MOUTH EVERY DAY    . glipiZIDE (GLUCOTROL) 5 MG tablet TAKE ONE TABLET BY MOUTH TWICE DAILY    . predniSONE (DELTASONE) 10 MG tablet     . triamcinolone cream (KENALOG) 0.1 % Apply 1 application topically 2 (two) times daily. (Patient not taking: Reported on 09/14/2016) 80 g 0  . vitamin B-12 (CYANOCOBALAMIN) 500 MCG tablet Take 1,000 mcg by mouth daily.      No facility-administered medications prior to visit.      Allergies:   Shellfish allergy; Cephalexin; Ciprofloxacin; Clopidogrel bisulfate; Codeine phosphate; Ezetimibe-simvastatin; Hydrocod polst-cpm polst er; Lidocaine;  Nitrofurantoin; Pregabalin; Propoxyphene n-acetaminophen; Rofecoxib; Sulfamethoxazole; Sulfonamide derivatives; Celecoxib; Codeine; Hydrocod polst-cpm polst er; Other; Propoxyphene; and Sulfa antibiotics   Social History   Social History  . Marital status: Married    Spouse name: N/A  . Number of children: N/A  . Years of education: N/A   Occupational History  . Retired Retired   Social History Main Topics  . Smoking status: Never Smoker  . Smokeless tobacco: Never Used  .  Alcohol use No  . Drug use: No  . Sexual activity: No   Other Topics Concern  . None   Social History Narrative  . None     Family History:  The patient's family history includes Cancer in her mother; Heart attack in her father and sister; Hypertension in her father.   ROS:   Please see the history of present illness.    Review of Systems  Constitution: Negative.  HENT: Negative.   Eyes: Negative.   Cardiovascular: Positive for chest pain, dyspnea on exertion and leg swelling.  Respiratory: Negative.   Hematologic/Lymphatic: Negative.   Musculoskeletal: Positive for arthritis, joint pain and joint swelling.  Gastrointestinal: Negative.   Genitourinary: Negative.   Neurological: Negative.    All other systems reviewed and are negative.   PHYSICAL EXAM:   VS:  BP 122/74   Pulse 74   Ht _0  (1.753 m)   Wt 178 lb 12.8 oz (81.1 kg)   BMI 26.40 kg/m   Physical Exam  GEN: Well nourished, well developed, in no acute distress  Neck: no JVD, carotid bruits, or masses Cardiac:RRR;As of S4, 1/6 systolic murmur at the left sternal border Respiratory:  clear to auscultation bilaterally, normal work of breathing GI: soft, nontender, nondistended, + BS Ext: Right lower extremity +1 edema in right ankle and a soft cast otherwise without cyanosis, clubbing,  Good distal pulses bilaterally MS: no deformity or atrophy  Skin: warm and dry, no rash Psych: euthymic mood, full affect  Wt Readings from Last  3 Encounters:  09/14/16 178 lb 12.8 oz (81.1 kg)  07/05/16 181 lb 4 oz (82.2 kg)  05/18/16 179 lb 8 oz (81.4 kg)      Studies/Labs Reviewed:   EKG:  EKG is ordered today.  The ekg ordered today demonstrates Normal sinus rhythm with old anterior infarct, no acute change  Recent Labs: 05/04/2016: ALT 14; BUN 29; Creatinine, Ser 1.21; Potassium 5.0; Sodium 139   Lipid Panel    Component Value Date/Time   CHOL 216 (H) 01/14/2016 0934   TRIG 152.0 (H) 01/14/2016 0934   HDL 52.30 01/14/2016 0934   CHOLHDL 4 01/14/2016 0934   VLDL 30.4 01/14/2016 0934   LDLCALC 133 (H) 01/14/2016 0934   LDLDIRECT 93.0 08/06/2015 0847    Additional studies/ records that were reviewed today include:  Cardiac cath 03/22/14: Left main: No obstructive disease.   Left Anterior Descending Artery: Large caliber vessel that courses to the apex. 30% proximal stenosis. 40% mid stenosis just prior to the stented segment. Mid stented segment patent without restenosis. Distal stented segment patent with 20% restenosis. Apical LAD becomes very small in caliber and has total occlusion with filling of apical vessel by left to left collaterals. (0.75 mm vessel).   Circumflex Artery: Large caliber vessel with several small OM branches. Diffuse luminal irregularities in the mid vessel.   Right Coronary Artery: Large dominant vessel with 20% mid stenosis.  Left Ventricular Angiogram: LVEF=70%  2-D echo 01/20/16 Study Conclusions   - Left ventricle: The cavity size was normal. Wall thickness was   increased in a pattern of mild LVH. There was mild focal basal   hypertrophy of the septum. Systolic function was normal. The   estimated ejection fraction was in the range of 60% to 65%. Wall   motion was normal; there were no regional wall motion   abnormalities. Doppler parameters are consistent with abnormal   left ventricular relaxation (grade 1 diastolic dysfunction).  Doppler parameters are consistent with high  ventricular filling   pressure. - Aortic valve: There was trivial regurgitation. - Mitral valve: Calcified annulus.   Impressions:   - Normal LV systolic function; grade 1 diastolic dysfunction with   elevated LV filling pressure; trace AI, MR and TR.    ASSESSMENT:    1. Other chest pain   2. Stable angina pectoris ([place])   3. Dyspnea on exertion   4. Coronary artery disease involving native coronary artery of native heart with angina pectoris ([place])   5. Essential hypertension      PLAN:  In order of problems listed above:  Chest pain is intermittent and responds to nitroglycerin. She does have CAD. Increase Imdur to 60 mg daily. I will see her back after her Lexi scan.  Stable angina pectoris as described above  Dyspnea on exertion is a new symptom for her. She has trouble going up hills or carrying packages. She is rocking harder because she broke her ankle and taking care of her husband with Parkinson's. Recommend Lexi scan Myoview to rule out ischemia.  CAD last cath 2015 patent stent to the LAD nonobstructive disease elsewhere. Chronic stable angina increase Imdur to 60 mg daily.  Essential hypertension controlled    Medication Adjustments/Labs and Tests Ordered: Current medicines are reviewed at length with the patient today.  Concerns regarding medicines are outlined above.  Medication changes, Labs and Tests ordered today are listed in the Patient Instructions below. Patient Instructions  Medication Instructions: [name] Barrios, PA-C, has recommended making the following medication changes: 1. INCREASE Isosorbide to 60 mg daily  Labwork: NONE ORDERED  Testing/Procedures: 1. [place] Stress Test - Your physician has requested that you have a lexiscan myoview. For further information please visit [email or web address]. Please follow instruction sheet, as given.  Follow-up: Selinda Eon recommends that you schedule a follow-up appointment in 3 weeks.  If you  need a refill on your cardiac medications before your next appointment, please call your pharmacy.    Signed, [name] Barrios, PA-C  09/14/2016 1:12 PM    [facility] [address], [place], [place]  [address] Phone: [phone]; Fax: [phone]

## 2016-09-14 NOTE — Patient Instructions (Signed)
Medication Instructions: Ermalinda Barrios, PA-C, has recommended making the following medication changes: 1. INCREASE Isosorbide to 60 mg daily  Labwork: NONE ORDERED  Testing/Procedures: 1. [place] Stress Test - Your physician has requested that you have a lexiscan myoview. For further information please visit [email or web address]. Please follow instruction sheet, as given.  Follow-up: Selinda Eon recommends that you schedule a follow-up appointment in 3 weeks.  If you need a refill on your cardiac medications before your next appointment, please call your pharmacy.

## 2016-09-21 ENCOUNTER — Telehealth (HOSPITAL_COMMUNITY): Payer: Self-pay | Admitting: Radiology

## 2016-09-21 NOTE — Telephone Encounter (Signed)
Patient given detailed instructions per Myocardial Perfusion Study Information Sheet for the test on 09/22/2016 at 7:45. Patient notified to arrive 15 minutes early and that it is imperative to arrive on time for appointment to keep from having the test rescheduled.  If you need to cancel or reschedule your appointment, please call the office within 24 hours of your appointment. Failure to do so may result in a cancellation of your appointment, and a $50 no show fee. Patient verbalized understanding.EHK

## 2016-09-22 ENCOUNTER — Ambulatory Visit (HOSPITAL_COMMUNITY): Payer: Medicare Other | Attending: Cardiovascular Disease

## 2016-09-22 DIAGNOSIS — R0609 Other forms of dyspnea: Secondary | ICD-10-CM | POA: Insufficient documentation

## 2016-09-22 DIAGNOSIS — E119 Type 2 diabetes mellitus without complications: Secondary | ICD-10-CM | POA: Diagnosis not present

## 2016-09-22 DIAGNOSIS — R0789 Other chest pain: Secondary | ICD-10-CM | POA: Insufficient documentation

## 2016-09-22 DIAGNOSIS — I2510 Atherosclerotic heart disease of native coronary artery without angina pectoris: Secondary | ICD-10-CM | POA: Insufficient documentation

## 2016-09-22 LAB — MYOCARDIAL PERFUSION IMAGING
CHL CUP NUCLEAR SDS: 3
CHL CUP NUCLEAR SSS: 6
CSEPPHR: 86 {beats}/min
LV dias vol: 78 mL (ref 46–106)
LVSYSVOL: 19 mL
RATE: 0.26
Rest HR: 64 {beats}/min
SRS: 3
TID: 0.91

## 2016-09-22 MED ORDER — TECHNETIUM TC 99M TETROFOSMIN IV KIT
10.7000 | PACK | Freq: Once | INTRAVENOUS | Status: AC | PRN
  Administered 2016-09-22: 10.7 via INTRAVENOUS
  Filled 2016-09-22: qty 11

## 2016-09-22 MED ORDER — TECHNETIUM TC 99M TETROFOSMIN IV KIT
32.2000 | PACK | Freq: Once | INTRAVENOUS | Status: AC | PRN
  Administered 2016-09-22: 32.2 via INTRAVENOUS
  Filled 2016-09-22: qty 33

## 2016-09-22 MED ORDER — REGADENOSON 0.4 MG/5ML IV SOLN
0.4000 mg | Freq: Once | INTRAVENOUS | Status: AC
  Administered 2016-09-22: 0.4 mg via INTRAVENOUS

## 2016-10-04 ENCOUNTER — Encounter: Payer: Self-pay | Admitting: Family Medicine

## 2016-10-04 ENCOUNTER — Ambulatory Visit (INDEPENDENT_AMBULATORY_CARE_PROVIDER_SITE_OTHER): Payer: Medicare Other | Admitting: Family Medicine

## 2016-10-04 VITALS — BP 108/57 | HR 79 | Temp 97.7°F | Ht 67.0 in | Wt 175.2 lb

## 2016-10-04 DIAGNOSIS — M25552 Pain in left hip: Secondary | ICD-10-CM | POA: Diagnosis not present

## 2016-10-04 DIAGNOSIS — M7062 Trochanteric bursitis, left hip: Secondary | ICD-10-CM

## 2016-10-04 MED ORDER — METHYLPREDNISOLONE ACETATE 40 MG/ML IJ SUSP
80.0000 mg | Freq: Once | INTRAMUSCULAR | Status: AC
  Administered 2016-10-04: 80 mg via INTRA_ARTICULAR

## 2016-10-04 NOTE — Progress Notes (Signed)
Pre visit review using our clinic review tool, if applicable. No additional management support is needed unless otherwise documented below in the visit note. 

## 2016-10-04 NOTE — Progress Notes (Signed)
Dr. Frederico Hamman T. Lulu Hirschmann, MD, [facility] [place], [address] Phone: [phone] Fax: [phone]  10/04/2016  Patient: [name] [name], MRN: [id number], DOB: 21-Aug-1932, 82 y.o.  Primary Physician:  Arnette Norris, MD   Chief Complaint  Patient presents with  . Follow-up    Left Leg   Subjective:   [name] [name] is a 82 y.o. very pleasant female patient who presents with the following:  Still having lateral troch pain. Long term back pain for many years.   Currently, she is not having any significant back pain.  She is not having any significant buttocks pain.  Her pain is localized to the lateral left side trochanteric bursa.  She did course of physical therapy over the summer, and per the therapist's notes, the patient had improved, but she states that she didn't really improve all that much and she has been ongoing and having pain since at least June.  I given her different home rehabilitation programs, she is on formal physical therapy, she has had multiple different injections to the trochanteric bursa, but her symptoms have been persistent and lasting on the order of years.  This summer she got another opinion and saw what sounds like Dr. Mardelle Matte at [facility].  They did another hip injection and recommended continued conservative management.  05/18/2016 Last OV with Owens Loffler, MD  Trouble putting pressure on her left leg.  [place] trip.   Pain laterally, with starting to walk, pain will be really bad. Pain with lifting up her left leg.   The patient has a history of trochanteric bursitis intermittently ongoing for a number of years.  She has had a failure of traditional conservative management.  She went to physical therapy a handful of years ago.  I have reviewed traditional rehabilitation for this for the hip numerous times.  We have done multiple trochanteric  Bursal injections which provided relief for a  short amount of time, but then she always has a return of symptoms.  She really has minimal back pain at this point.  No pain in her buttocks and no radicular pain.  03/10/2016 Last OV with Owens Loffler, MD  Buttocks pain on the left side. Radiating down the leg. This is mostly on the lateral, is never on the posterior aspect and never beyond the knee. She has pain in the lateral trochanteric bursal region. Her back doesn't have any pain at all. She occasionally will have some pain on the posterior rim on the left, but it is still around the bursa on the trochanter primarily. Strength is always been good, but she has not been all that diligent with doing any stretching of this region.  L GTB.  GTB inj  Rehab reviewed  02/04/2016 Last OV with Owens Loffler, MD  F/u L hip lateral pain:  GTB. Patient who I remember well who presents with recurrent left-sided lateral hip pain.  She has had recurrent trochanteric bursitis on the left, and we've injected this a number of times.  She does have some resolution of symptoms the last anywhere from 2 or 3 months up to 6 months or longer, but she has had recurrence over time.  She is not having any significant back pain right now and she is not having any groin pain.  She moves her hip quite well.  Past Medical History, Surgical History, Social History, Family History, Problem List, Medications, and Allergies have been reviewed and  updated if relevant.  Patient Active Problem List   Diagnosis Date Noted  . Left leg pain 07/05/2016  . Right foot injury 07/05/2016  . Stress due to illness of family member 05/04/2016  . Hives 05/04/2016  . Dysuria 01/14/2016  . Pain in joints of both feet 01/14/2016  . Osteopenia 08/06/2015  . Constipation 12/30/2014  . B12 deficiency 12/30/2014  . Allergic reaction 09/02/2014  . Arthralgia of right foot 07/18/2014  . Pruritic intertrigo 07/18/2014  . Right hip pain 06/04/2014  . Other malaise and fatigue  06/04/2014  . Elevated LFTs 04/19/2014  . Mitral regurgitation 04/19/2014  . Burning sensation of feet 06/13/2013  . Pes planus 06/13/2013  . Left hip pain 10/02/2012  . Renal insufficiency 06/07/2011  . Diabetes mellitus with renal manifestations, controlled ([place]) 05/31/2011  . MIXED HYPERLIPIDEMIA 08/04/2010  . BEN HTN HEART DISEASE WITHOUT HEART FAIL 08/04/2010  . LUMBAR SPRAIN AND STRAIN 01/15/2010  . VERTIGO 10/29/2009  . OSTEOPENIA 04/29/2009  . 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 02/25/2009  . EXTERNAL HEMORRHOIDS 02/24/2009  . DIVERTICULOSIS, COLON 02/24/2009  . HEMOPTYSIS 09/02/2008  . BACK PAIN, THORACIC REGION 04/30/2008  . LYMPHOMA, MALT 08/28/2007  . DETACHED RETINA 08/28/2007  . LABILE HYPERTENSION 08/28/2007  . Coronary atherosclerosis 08/28/2007  . MITRAL VALVE PROLAPSE 08/28/2007  . GERD 08/28/2007  . IBS 08/28/2007  . BREAST CYSTS, BILATERAL 08/28/2007  . LOW BACK PAIN, CHRONIC 06/29/2007    Past Medical History:  Diagnosis Date  . Allergy    Cipro, Plavix, Statins  . CAD (coronary artery disease)    a. s/p tandem Promus DES to LAD in 2009 by Dr. Olevia Perches;  b.  LHC (03/22/14):  prox LAD 30%, mid LAD 40%, LAD stent ok with dist 20% ISR, apical LAD occluded with L-L collats filling apical vessel (too small for PCI), mid RCA 20%, EF 70%.  Med Rx  . Diabetes mellitus    Diagnosed 2012  . Diverticulosis of colon (without mention of hemorrhage)   . GERD (gastroesophageal reflux disease)   . Hx of cardiovascular stress test    a. Nuclear (08/2013):  No ischemia, EF 80%, Normal  . Hx of echocardiogram    a. Echo (07/2013):  Mild LVH, vigorous LVF, EF 65-70%, Gr 1 DD, mild MR, mild to mod LAE  . Hyperlipidemia    intol of statins  . Hypertension   . NHL (non-Hodgkin's lymphoma) ([place]) dx'd 2003   chemo/xrt comp 2003  . Personal history of colonic polyps   . Proctitis   . Urticaria     Past Surgical History:  Procedure Laterality Date  . cardiac  cath-neg    . CORONARY ANGIOPLASTY WITH STENT PLACEMENT     x 2  . dexa-neg    . KNEE SURGERY  10/2003   left  . laser surgery for cataracts-left    . LEFT HEART CATHETERIZATION WITH CORONARY ANGIOGRAM N/A 03/22/2014   Procedure: LEFT HEART CATHETERIZATION WITH CORONARY ANGIOGRAM;  Surgeon: Burnell Blanks, MD;  Location: [facility] CATH LAB;  Service: Cardiovascular;  Laterality: N/A;  . stress cardiolite    . VAGINAL HYSTERECTOMY     partial, fibroids, one ovary left    Social History   Social History  . Marital status: Married    Spouse name: N/A  . Number of children: N/A  . Years of education: N/A   Occupational History  . Retired Retired   Social History Main Topics  . Smoking status: Never Smoker  .  Smokeless tobacco: Never Used  . Alcohol use No  . Drug use: No  . Sexual activity: No   Other Topics Concern  . Not on file   Social History Narrative  . No narrative on file    Family History  Problem Relation Age of Onset  . Cancer Mother     jaw  . Hypertension Father   . Heart attack Father   . Heart attack Sister   . Colon cancer Neg Hx     Allergies  Allergen Reactions  . Shellfish Allergy Anaphylaxis and Rash  . Cephalexin     REACTION: trash and swelling REACTION: trash and swelling  . Ciprofloxacin     REACTION: u/k  . Clopidogrel Bisulfate     REACTION: swelling, rash  . Codeine Phosphate     REACTION: unspecified  . Ezetimibe-Simvastatin     REACTION: myalgias REACTION: myalgias  . Hydrocod Polst-Cpm Polst Er     REACTION: u/k  . Lidocaine Hives    ONLY TO ADHESIVE LIDOCAINE PATCHES. NO PROBLEM WITH INJECTABLE LIDOCAINE.  Marland Kitchen Nitrofurantoin     REACTION: [place] REACTION: [place]  . Pregabalin     REACTION: felt bad REACTION: felt bad  . Propoxyphene N-Acetaminophen     REACTION: [place]  . Rofecoxib     unk unk   . Sulfamethoxazole     REACTION: unspecified  . Sulfonamide Derivatives     REACTION: unspecified  . Celecoxib Rash     REACTION: [place] REACTION: [place]  . Codeine Rash    REACTION: [place] REACTION: [place]  . Hydrocod Polst-Cpm Polst Er Rash  . Other Rash  . Propoxyphene Rash  . Sulfa Antibiotics Rash    REACTION: unspecified REACTION: unspecified    Medication list reviewed and updated in full in [place].  GEN: No fevers, chills. Nontoxic. Primarily MSK c/o today. MSK: Detailed in the HPI GI: tolerating PO intake without difficulty Neuro: No numbness, parasthesias, or tingling associated. Otherwise the pertinent positives of the ROS are noted above.   Objective:   BP (!) 108/57   Pulse 79   Temp 97.7 F (36.5 C) (Oral)   Ht _0  (1.702 m)   Wt 175 lb 4 oz (79.5 kg)   BMI 27.45 kg/m    GEN: WDWN, NAD, Non-toxic, Alert & Oriented x 3 HEENT: Atraumatic, Normocephalic.  Ears and Nose: No external deformity. EXTR: No clubbing/cyanosis/edema NEURO: Normal gait.  PSYCH: Normally interactive. Conversant. Not depressed or anxious appearing.  Calm demeanor.   HIP EXAM: SIDE: L ROM: Abduction, Flexion, Internal and External range of motion: full Pain with terminal IROM and EROM: no GTB: ttp on the L lateral SLR: NEG Knees: No effusion FABER: NT REVERSE FABER: NT, neg Piriformis: NT at direct palpation Str: flexion: 5/5 abduction: 4+/5 adduction: 5/5 Strength testing non-tender  Radiology: Xray, hip series Indication: pain Findings: No occult fracture or dislocation. No signficant osteoarthritic changes.  Prior films reviewed again. Electronically Signed  By: Owens Loffler, MD On: 10/05/2016 9:26 AM   Assessment and Plan:   Trochanteric bursitis, left hip  Left hip pain - Plan: methylPREDNISolone acetate (DEPO-MEDROL) injection 80 mg  Very challenging case, the patient is frustrated, and she is had failure of extensive conservative management.  I discussed with her that potentially she could be an option for surgical bursectomy.  I also suggested that she very well would be well  suited to get another opinion from an orthopedic surgeon or someone who does surgical bursectomy  or concentrates on the hip to get their evaluation.  I am at a bit of a loss why this patient has had symptoms so persistently in this region.  Continue with home rehabilitation, and we will do a trochanteric bursa injection again on this patient today.  Trochanteric Bursitis Injection, L Verbal consent obtained. Risks (including infection, potential atrophy), benefits, and alternatives reviewed. Greater trochanter sterilely prepped with Chloraprep. Ethyl Chloride used for anesthesia. 8 cc of Lidocaine 1% injected with 2 mL of Depo-Medrol 40 mg into trochanteric bursa at area of maximal tenderness at greater trochanter. Needle taken to bone to troch bursa, flows easily. Bursa massaged. No bleeding and no complications. Decreased pain after injection. Needle: 22 gauge spinal needle   Follow-up: No Follow-up on file.  Meds ordered this encounter  Medications  . methylPREDNISolone acetate (DEPO-MEDROL) injection 80 mg   Medications Discontinued During This Encounter  Medication Reason  . methylPREDNISolone (MEDROL DOSEPAK) 4 MG TBPK tablet Completed Course  . miconazole (MICOTIN) 2 % powder Completed Course   No orders of the defined types were placed in this encounter.   Signed,  Maud Deed. Doniven Vanpatten, MD     Medication List       Accurate as of 10/04/16 11:59 PM. Always use your most recent med list.          ASPIRIN LOW DOSE 81 MG tablet Generic drug:  aspirin Take 81 mg by mouth daily.   COMBIGAN 0.2-0.5 % ophthalmic solution Generic drug:  brimonidine-timolol Place 1 drop into both eyes every 12 (twelve) hours.   ezetimibe 10 MG tablet Commonly known as:  ZETIA TAKE ONE TABLET BY MOUTH EVERY DAY   glipiZIDE 5 MG tablet Commonly known as:  GLUCOTROL Take 1 tablet (5 mg total) by mouth 2 (two) times daily.   glucose blood test strip Commonly known as:  ONETOUCH VERIO 1  each by Other route 2 (two) times daily. Use as instructed   hydrOXYzine 25 MG capsule Commonly known as:  VISTARIL Take 1 capsule (25 mg total) by mouth 3 (three) times daily as needed for anxiety or itching.   isosorbide mononitrate 60 MG 24 hr tablet Commonly known as:  IMDUR Take 1 tablet (60 mg total) by mouth daily.   lansoprazole 15 MG capsule Commonly known as:  PREVACID TAKE ONE CAPSULE BY MOUTH EVERY DAY   latanoprost 0.005 % ophthalmic solution Commonly known as:  XALATAN Place 1 drop into both eyes at bedtime.   losartan 50 MG tablet Commonly known as:  COZAAR TAKE ONE TABLET BY MOUTH EVERY DAY   metoprolol succinate 100 MG 24 hr tablet Commonly known as:  TOPROL-XL TAKE ONE TABLET BY MOUTH EVERY MORNING   nitroGLYCERIN 0.4 MG SL tablet Commonly known as:  NITROSTAT Place 1 tablet (0.4 mg total) under the tongue every 5 (five) minutes as needed for chest pain.   ONE TOUCH LANCETS Misc 1 each by Does not apply route 2 (two) times daily.   [place] w/Device Kit 1 Device by Does not apply route once.   OVER THE COUNTER MEDICATION Take 1 tablet by mouth daily. Med name: VISION FORMULA   trimethoprim 100 MG tablet Commonly known as:  TRIMPEX Take 100 mg by mouth 2 (two) times daily as needed (UTI).   vitamin B-12 1000 MCG tablet Commonly known as:  CYANOCOBALAMIN Take 1,000 mcg by mouth daily.   Vitamin D-3 1000 units Caps Take 1 capsule by mouth daily.

## 2016-10-11 ENCOUNTER — Ambulatory Visit: Payer: Medicare Other | Admitting: Physician Assistant

## 2016-10-12 ENCOUNTER — Ambulatory Visit (INDEPENDENT_AMBULATORY_CARE_PROVIDER_SITE_OTHER): Payer: Medicare Other | Admitting: Podiatry

## 2016-10-12 DIAGNOSIS — S93401D Sprain of unspecified ligament of right ankle, subsequent encounter: Secondary | ICD-10-CM | POA: Diagnosis not present

## 2016-10-12 DIAGNOSIS — IMO0001 Reserved for inherently not codable concepts without codable children: Secondary | ICD-10-CM

## 2016-10-12 DIAGNOSIS — M7751 Other enthesopathy of right foot: Secondary | ICD-10-CM

## 2016-10-12 DIAGNOSIS — M25571 Pain in right ankle and joints of right foot: Secondary | ICD-10-CM

## 2016-10-12 DIAGNOSIS — M659 Synovitis and tenosynovitis, unspecified: Secondary | ICD-10-CM | POA: Diagnosis not present

## 2016-10-12 DIAGNOSIS — M65971 Unspecified synovitis and tenosynovitis, right ankle and foot: Secondary | ICD-10-CM

## 2016-10-12 DIAGNOSIS — M25471 Effusion, right ankle: Secondary | ICD-10-CM

## 2016-10-14 ENCOUNTER — Other Ambulatory Visit: Payer: Self-pay | Admitting: Family Medicine

## 2016-10-16 ENCOUNTER — Other Ambulatory Visit: Payer: Self-pay | Admitting: Family Medicine

## 2016-10-18 ENCOUNTER — Ambulatory Visit (INDEPENDENT_AMBULATORY_CARE_PROVIDER_SITE_OTHER): Payer: Medicare Other | Admitting: Physician Assistant

## 2016-10-18 ENCOUNTER — Encounter: Payer: Self-pay | Admitting: Physician Assistant

## 2016-10-18 VITALS — BP 120/50 | HR 60 | Ht 69.0 in | Wt 172.0 lb

## 2016-10-18 DIAGNOSIS — R0609 Other forms of dyspnea: Secondary | ICD-10-CM | POA: Diagnosis not present

## 2016-10-18 DIAGNOSIS — I2510 Atherosclerotic heart disease of native coronary artery without angina pectoris: Secondary | ICD-10-CM

## 2016-10-18 DIAGNOSIS — I10 Essential (primary) hypertension: Secondary | ICD-10-CM | POA: Diagnosis not present

## 2016-10-18 NOTE — Patient Instructions (Addendum)
Your physician recommends that you continue on your current medications as directed. Please refer to the Current Medication list given to you today.   Your physician recommends that you schedule a follow-up appointment in:  3 MONTHS WITH DR MCALHANY  

## 2016-10-18 NOTE — Progress Notes (Signed)
Cardiology Office Note    Date:  10/18/2016   ID:  [name] [name], DOB Jun 20, 1932, MRN [id number]  PCP:  Arnette Norris, MD  Cardiologist: Dr. Angelena Form  Chief Complaint  Patient presents with  . Follow-up    History of Present Illness:  [name] [name] is a 82 y.o. female with history of CADStatus post tandem nonoverlapping DES to the LAD in 2009. Chest pain 2010 with negative Myoview scan. Negative Myoview in 2014, chest pain 2015 repeat cath demonstrated a patent LAD stent but occluded apical LAD with left to left collaterals. This vessel was too small for PCI. She had nonobstructive disease elsewhere. 2-D echo 2014 normal LV size and function, mild MR. She has been intolerant to statins and Plavix in the past., HLD, HTN, non-Hodgkin's lymphoma, DM. ABIs 2016 normal   Last follow-up with Dr.McAlhany 12/2015 . 2-D echo showed normal LV systolic function grade 1 DD with elevated LV filling pressures, trace AI, MR and TR, EF 60-65%.  I saw the patient 09/14/16 complaining of intermittent chest pain and dyspnea on exertion that was responsive to nitroglycerin. I increased her Imdur to 60 mg daily and ordered a Lexi scan. This was done on 09/22/16 normal LVEF 76% normal study low risk.  Patient comes in today feeling much better. She denies any further chest pain since increasing Imdur. She was under a lot of stress with her husband who has Parkinson's. Her ankle is also doing better that she fractured.        Past Medical History:  Diagnosis Date  . Allergy    Cipro, Plavix, Statins  . CAD (coronary artery disease)    a. s/p tandem Promus DES to LAD in 2009 by Dr. Olevia Perches;  b.  LHC (03/22/14):  prox LAD 30%, mid LAD 40%, LAD stent ok with dist 20% ISR, apical LAD occluded with L-L collats filling apical vessel (too small for PCI), mid RCA 20%, EF 70%.  Med Rx  . Diabetes mellitus    Diagnosed 2012  . Diverticulosis of colon (without mention of hemorrhage)   . GERD (gastroesophageal reflux  disease)   . Hx of cardiovascular stress test    a. Nuclear (08/2013):  No ischemia, EF 80%, Normal  . Hx of echocardiogram    a. Echo (07/2013):  Mild LVH, vigorous LVF, EF 65-70%, Gr 1 DD, mild MR, mild to mod LAE  . Hyperlipidemia    intol of statins  . Hypertension   . NHL (non-Hodgkin's lymphoma) ([place]) dx'd 2003   chemo/xrt comp 2003  . Personal history of colonic polyps   . Proctitis   . Urticaria     Past Surgical History:  Procedure Laterality Date  . cardiac cath-neg    . CORONARY ANGIOPLASTY WITH STENT PLACEMENT     x 2  . dexa-neg    . KNEE SURGERY  10/2003   left  . laser surgery for cataracts-left    . LEFT HEART CATHETERIZATION WITH CORONARY ANGIOGRAM N/A 03/22/2014   Procedure: LEFT HEART CATHETERIZATION WITH CORONARY ANGIOGRAM;  Surgeon: Burnell Blanks, MD;  Location: [facility] CATH LAB;  Service: Cardiovascular;  Laterality: N/A;  . stress cardiolite    . VAGINAL HYSTERECTOMY     partial, fibroids, one ovary left    Current Medications: Outpatient Medications Prior to Visit  Medication Sig Dispense Refill  . aspirin (ASPIRIN LOW DOSE) 81 MG tablet Take 81 mg by mouth daily.      . Blood Glucose Monitoring Suppl [facility]  [place]) w/Device KIT 1 Device by Does not apply route once. 1 kit 0  . brimonidine-timolol (COMBIGAN) 0.2-0.5 % ophthalmic solution Place 1 drop into both eyes every 12 (twelve) hours.    . Cholecalciferol (VITAMIN D-3) 1000 UNITS CAPS Take 1 capsule by mouth daily.    Marland Kitchen ezetimibe (ZETIA) 10 MG tablet TAKE ONE TABLET BY MOUTH EVERY DAY 30 tablet 6  . glipiZIDE (GLUCOTROL) 5 MG tablet Take 1 tablet (5 mg total) by mouth 2 (two) times daily. 90 tablet 1  . glucose blood (ONETOUCH VERIO) test strip 1 each by Other route 2 (two) times daily. Use as instructed 100 each 3  . hydrOXYzine (VISTARIL) 25 MG capsule Take 1 capsule (25 mg total) by mouth 3 (three) times daily as needed for anxiety or itching. 90 capsule 0  . isosorbide  mononitrate (IMDUR) 60 MG 24 hr tablet Take 1 tablet (60 mg total) by mouth daily. 30 tablet 11  . lansoprazole (PREVACID) 15 MG capsule TAKE ONE CAPSULE BY MOUTH EVERY DAY 90 capsule 2  . latanoprost (XALATAN) 0.005 % ophthalmic solution Place 1 drop into both eyes at bedtime.     Marland Kitchen losartan (COZAAR) 50 MG tablet TAKE ONE TABLET BY MOUTH EVERY DAY 30 tablet 6  . metoprolol succinate (TOPROL-XL) 100 MG 24 hr tablet TAKE ONE TABLET BY MOUTH EVERY MORNING 30 tablet 7  . nitroGLYCERIN (NITROSTAT) 0.4 MG SL tablet Place 1 tablet (0.4 mg total) under the tongue every 5 (five) minutes as needed for chest pain. 30 tablet 3  . ONE TOUCH LANCETS MISC 1 each by Does not apply route 2 (two) times daily.    Marland Kitchen OVER THE COUNTER MEDICATION Take 1 tablet by mouth daily. Med name: VISION FORMULA    . trimethoprim (TRIMPEX) 100 MG tablet Take 100 mg by mouth 2 (two) times daily as needed (UTI).     . vitamin B-12 (CYANOCOBALAMIN) 1000 MCG tablet Take 1,000 mcg by mouth daily.     No facility-administered medications prior to visit.      Allergies:   Shellfish allergy; Cephalexin; Ciprofloxacin; Clopidogrel bisulfate; Codeine phosphate; Ezetimibe-simvastatin; Hydrocod polst-cpm polst er; Lidocaine; Nitrofurantoin; Pregabalin; Propoxyphene n-acetaminophen; Rofecoxib; Sulfamethoxazole; Sulfonamide derivatives; Celecoxib; Codeine; Hydrocod polst-cpm polst er; Other; Propoxyphene; and Sulfa antibiotics   Social History   Social History  . Marital status: Married    Spouse name: N/A  . Number of children: N/A  . Years of education: N/A   Occupational History  . Retired Retired   Social History Main Topics  . Smoking status: Never Smoker  . Smokeless tobacco: Never Used  . Alcohol use No  . Drug use: No  . Sexual activity: No   Other Topics Concern  . Not on file   Social History Narrative  . No narrative on file     Family History:  The patient's family history includes Cancer in her mother; Heart  attack in her father and sister; Hypertension in her father.   ROS:   Please see the history of present illness.    Review of Systems  Constitution: Negative.  HENT: Negative.   Eyes: Negative.   Respiratory: Negative.   Hematologic/Lymphatic: Negative.   Musculoskeletal: Positive for arthritis, back pain and joint pain.  Gastrointestinal: Negative.   Genitourinary: Negative.   Neurological: Negative.   Psychiatric/Behavioral: The patient has insomnia.    All other systems reviewed and are negative.   PHYSICAL EXAM:   VS:  BP (!) 120/50  Pulse 60   Ht '5\' 9"'$  (1.753 m)   Wt 172 lb (78 kg)   BMI 25.40 kg/m   Physical Exam  GEN: Well nourished, well developed, in no acute distress  Neck: no JVD, carotid bruits, or masses Cardiac:RRR; positive S4, no murmurs, rubs, or gallops  Respiratory:  clear to auscultation bilaterally, normal work of breathing GI: soft, nontender, nondistended, + BS Ext: without cyanosis, clubbing, or edema, Good distal pulses bilaterally MS: no deformity or atrophy  Skin: warm and dry, no rash Psych: euthymic mood, full affect  Wt Readings from Last 3 Encounters:  10/18/16 172 lb (78 kg)  10/04/16 175 lb 4 oz (79.5 kg)  09/22/16 178 lb (80.7 kg)      Studies/Labs Reviewed:   EKG:  EKG is not ordered today.   Recent Labs: 05/04/2016: ALT 14; BUN 29; Creatinine, Ser 1.21; Potassium 5.0; Sodium 139   Lipid Panel    Component Value Date/Time   CHOL 216 (H) 01/14/2016 0934   TRIG 152.0 (H) 01/14/2016 0934   HDL 52.30 01/14/2016 0934   CHOLHDL 4 01/14/2016 0934   VLDL 30.4 01/14/2016 0934   LDLCALC 133 (H) 01/14/2016 0934   LDLDIRECT 93.0 08/06/2015 0847    Additional studies/ records that were reviewed today include:  Nuclear stress test 09/22/16  Nuclear stress EF: 76%.  The left ventricular ejection fraction is hyperdynamic (>65%).  There was no ST segment deviation noted during stress.  The study is normal.  This is a low risk  study.   Cardiac cath 03/22/14: Left main: No obstructive disease.   Left Anterior Descending Artery: Large caliber vessel that courses to the apex. 30% proximal stenosis. 40% mid stenosis just prior to the stented segment. Mid stented segment patent without restenosis. Distal stented segment patent with 20% restenosis. Apical LAD becomes very small in caliber and has total occlusion with filling of apical vessel by left to left collaterals. (0.75 mm vessel).   Circumflex Artery: Large caliber vessel with several small OM branches. Diffuse luminal irregularities in the mid vessel.   Right Coronary Artery: Large dominant vessel with 20% mid stenosis.  Left Ventricular Angiogram: LVEF=70%   2-D echo 01/20/16 Study Conclusions   - Left ventricle: The cavity size was normal. Wall thickness was   increased in a pattern of mild LVH. There was mild focal basal   hypertrophy of the septum. Systolic function was normal. The   estimated ejection fraction was in the range of 60% to 65%. Wall   motion was normal; there were no regional wall motion   abnormalities. Doppler parameters are consistent with abnormal   left ventricular relaxation (grade 1 diastolic dysfunction).   Doppler parameters are consistent with high ventricular filling   pressure. - Aortic valve: There was trivial regurgitation. - Mitral valve: Calcified annulus.   Impressions:   - Normal LV systolic function; grade 1 diastolic dysfunction with   elevated LV filling pressure; trace AI, MR and TR.         ASSESSMENT:    1. Atherosclerosis of native coronary artery of native heart without angina pectoris   2. Essential hypertension   3. Dyspnea on exertion      PLAN:  In order of problems listed above:  CAD angina improved with increasing Imdur to 60 mg daily. Lexi scan Myoview was normal. Continue medical management. Follow-up with Dr. Angelena Form in Feb.  Essential hypertension controlled  Dyspnea on exertion  improved.    Medication Adjustments/Labs and Tests  Ordered: Current medicines are reviewed at length with the patient today.  Concerns regarding medicines are outlined above.  Medication changes, Labs and Tests ordered today are listed in the Patient Instructions below. Patient Instructions  Your physician recommends that you continue on your current medications as directed. Please refer to the Current Medication list given to you today.   Your physician recommends that you schedule a follow-up appointment in: [place]      Signed, Ermalinda Barrios, PA-C  10/18/2016 12:30 PM    [address], [place], [place]  [address] Phone: [phone]; Fax: [phone]

## 2016-10-24 MED ORDER — BETAMETHASONE SOD PHOS & ACET 6 (3-3) MG/ML IJ SUSP: 3.0000 mg | Freq: Once | INTRAMUSCULAR | Status: DC

## 2016-10-24 NOTE — Progress Notes (Signed)
Subjective: Patient is a history of a right ankle sprain. She still relates some pain and tenderness to the right ankle. Patient also states that she's been treated by an orthopedic specialist for left hip bursitis. Patient is currently receiving injections into the left hip bursa. Patient has no other complaints at this time.   Objective: Physical Exam General: The patient is alert and oriented x3 in no acute distress.  Dermatology: Skin is warm, dry and supple bilateral lower extremities. Negative for open lesions or macerations.  Vascular: Palpable pedal pulses bilaterally. No edema or erythema noted. Capillary refill within normal limits.  Neurological: Epicritic and protective threshold grossly intact bilaterally.   Musculoskeletal Exam: Pain on palpation overlying the ATF ligament right ankle. Also pain on palpation to the tubercle of the fifth metatarsal right foot. Range of motion within normal limits to all pedal and ankle joints bilateral. Muscle strength 5/5 in all groups bilateral.   Radiographic Exam:    Normal osseous mineralization. Joint spaces preserved. No fracture/dislocation/boney destruction.     Assessment:  Problem List Items Addressed This Visit    None        Plan of Care:  #1 Patient was evaluated. #2 Injection of 0.5 mL Celestone Soluspan injected in the patient's right ankle joint #3 compression anklet dispensed #4 continue anti-inflammatory pain cream through [facility] #5 continue ankle stabilization brace as needed #6 return to clinic when necessary    Dr. Edrick Kins, [place]

## 2016-10-28 ENCOUNTER — Other Ambulatory Visit: Payer: Self-pay | Admitting: Family Medicine

## 2017-01-04 ENCOUNTER — Other Ambulatory Visit: Payer: Self-pay | Admitting: Cardiovascular Disease

## 2017-01-20 ENCOUNTER — Encounter: Payer: Self-pay | Admitting: Cardiovascular Disease

## 2017-01-26 ENCOUNTER — Encounter: Payer: Self-pay | Admitting: Family Medicine

## 2017-01-26 ENCOUNTER — Ambulatory Visit (INDEPENDENT_AMBULATORY_CARE_PROVIDER_SITE_OTHER): Payer: Medicare Other | Admitting: Family Medicine

## 2017-01-26 VITALS — BP 120/80 | HR 67 | Wt 177.1 lb

## 2017-01-26 DIAGNOSIS — M25552 Pain in left hip: Secondary | ICD-10-CM

## 2017-01-26 DIAGNOSIS — E0821 Diabetes mellitus due to underlying condition with diabetic nephropathy: Secondary | ICD-10-CM

## 2017-01-26 DIAGNOSIS — E1121 Type 2 diabetes mellitus with diabetic nephropathy: Secondary | ICD-10-CM

## 2017-01-26 DIAGNOSIS — F5109 Other insomnia not due to a substance or known physiological condition: Secondary | ICD-10-CM | POA: Diagnosis not present

## 2017-01-26 DIAGNOSIS — M79605 Pain in left leg: Secondary | ICD-10-CM

## 2017-01-26 LAB — COMPREHENSIVE METABOLIC PANEL
ALBUMIN: 4.1 g/dL (ref 3.5–5.2)
ALT: 39 U/L — AB (ref 0–35)
AST: 36 U/L (ref 0–37)
Alkaline Phosphatase: 55 U/L (ref 39–117)
BILIRUBIN TOTAL: 0.6 mg/dL (ref 0.2–1.2)
BUN: 13 mg/dL (ref 6–23)
CALCIUM: 9.6 mg/dL (ref 8.4–10.5)
CO2: 30 meq/L (ref 19–32)
CREATININE: 1.06 mg/dL (ref 0.40–1.20)
Chloride: 102 mEq/L (ref 96–112)
GFR: 52.62 mL/min — ABNORMAL LOW (ref >60.00)
Glucose, Bld: 160 mg/dL — ABNORMAL HIGH (ref 70–99)
Potassium: 4.7 mEq/L (ref 3.5–5.1)
Sodium: 137 mEq/L (ref 135–145)
Total Protein: 6.7 g/dL (ref 6.0–8.3)

## 2017-01-26 LAB — VITAMIN D 25 HYDROXY (VIT D DEFICIENCY, FRACTURES): VITD: 32.86 ng/mL (ref 30.00–100.00)

## 2017-01-26 LAB — HEMOGLOBIN A1C: Hgb A1c MFr Bld: 7.2 % — ABNORMAL HIGH (ref 4.6–6.5)

## 2017-01-26 MED ORDER — TRAZODONE HCL 50 MG PO TABS
25.0000 mg | ORAL_TABLET | Freq: Every evening | ORAL | 3 refills | Status: DC | PRN
Start: 2017-01-26 — End: 2017-12-26

## 2017-01-26 NOTE — Assessment & Plan Note (Signed)
I suspect this is still related to her left hip pain. Also not sleeping well. I advised we check labs today, start trazodone for sleep and for her to follow up with Dr. [name] about her leg/hip. The patient indicates understanding of these issues and agrees with the plan. Orders Placed This Encounter  Procedures  . Hemoglobin A1c  . Comprehensive metabolic panel  . Vitamin D, 25-hydroxy

## 2017-01-26 NOTE — Progress Notes (Signed)
Subjective:   Patient ID: [name] [name], female    DOB: 11/02/1932, 83 y.o.   MRN: [id number]  [name] [name] is a pleasant 83 y.o. year old female who presents to clinic today with Leg Pain (left )  on 01/26/2017  HPI:   Has been followed by Dr. [name] and Dr. Mardelle Matte for left hip/leg pain for months now.  She did have bursal injections and PT which provided some relief.  She last saw Dr. [name] on 10/04/16.  Note reviewed. She did received another left bursal injection at that [facility]. This has helped her.  Hip is sore but not was painful.  Now having more left upper thigh pain- feels like a toothache.  Not sleeping well.  Husband recently diagnosed with PD and it has been stressful for her. Denies SI or HI.   Current Outpatient Prescriptions on File Prior to Visit  Medication Sig Dispense Refill  . aspirin (ASPIRIN LOW DOSE) 81 MG tablet Take 81 mg by mouth daily.      . Blood Glucose Monitoring Suppl ([place]) w/Device KIT 1 Device by Does not apply route once. 1 kit 0  . brimonidine-timolol (COMBIGAN) 0.2-0.5 % ophthalmic solution Place 1 drop into both eyes every 12 (twelve) hours.    . Cholecalciferol (VITAMIN D-3) 1000 UNITS CAPS Take 1 capsule by mouth daily.    Marland Kitchen ezetimibe (ZETIA) 10 MG tablet TAKE ONE TABLET BY MOUTH EVERY DAY 30 tablet 6  . glipiZIDE (GLUCOTROL) 5 MG tablet TAKE ONE TABLET BY MOUTH TWICE A DAY. 180 tablet 1  . hydrOXYzine (VISTARIL) 25 MG capsule Take 1 capsule (25 mg total) by mouth 3 (three) times daily as needed for anxiety or itching. 90 capsule 0  . isosorbide mononitrate (IMDUR) 60 MG 24 hr tablet Take 1 tablet (60 mg total) by mouth daily. 30 tablet 11  . lansoprazole (PREVACID) 15 MG capsule TAKE ONE CAPSULE BY MOUTH EVERY DAY 90 capsule 2  . latanoprost (XALATAN) 0.005 % ophthalmic solution Place 1 drop into both eyes at bedtime.     Marland Kitchen losartan (COZAAR) 50 MG tablet TAKE ONE TABLET BY MOUTH EVERY DAY. 30 tablet 8  . metoprolol  succinate (TOPROL-XL) 100 MG 24 hr tablet TAKE ONE TABLET BY MOUTH EVERY MORNING 30 tablet 7  . nitroGLYCERIN (NITROSTAT) 0.4 MG SL tablet Place 1 tablet (0.4 mg total) under the tongue every 5 (five) minutes as needed for chest pain. 30 tablet 3  . ONE TOUCH LANCETS MISC 1 each by Does not apply route 2 (two) times daily.    . ONE TOUCH ULTRA TEST test strip TEST TWICE DAILY 100 each 2  . OVER THE COUNTER MEDICATION Take 1 tablet by mouth daily. Med name: VISION FORMULA    . trimethoprim (TRIMPEX) 100 MG tablet Take 100 mg by mouth 2 (two) times daily as needed (UTI).     . vitamin B-12 (CYANOCOBALAMIN) 1000 MCG tablet Take 1,000 mcg by mouth daily.    . [DISCONTINUED] loratadine (CLARITIN) 10 MG tablet Take 10 mg by mouth as needed.      Current Facility-Administered Medications on File Prior to Visit  Medication Dose Route Frequency Provider Last Rate Last Dose  . betamethasone acetate-betamethasone sodium phosphate (CELESTONE) injection 3 mg  3 mg Intramuscular Once Edrick Kins, DPM        Allergies  Allergen Reactions  . Shellfish Allergy Anaphylaxis and Rash  . Cephalexin     REACTION: trash and swelling  REACTION: trash and swelling  . Ciprofloxacin     REACTION: u/k  . Clopidogrel Bisulfate     REACTION: swelling, rash  . Codeine Phosphate     REACTION: unspecified  . Ezetimibe-Simvastatin     REACTION: myalgias REACTION: myalgias  . Hydrocod Polst-Cpm Polst Er     REACTION: u/k  . Lidocaine Hives    ONLY TO ADHESIVE LIDOCAINE PATCHES. NO PROBLEM WITH INJECTABLE LIDOCAINE.  Marland Kitchen Nitrofurantoin     REACTION: [place] REACTION: [place]  . Pregabalin     REACTION: felt bad REACTION: felt bad  . Propoxyphene N-Acetaminophen     REACTION: [place]  . Rofecoxib     unk unk   . Sulfamethoxazole     REACTION: unspecified  . Sulfonamide Derivatives     REACTION: unspecified  . Celecoxib Rash    REACTION: [place] REACTION: [place]  . Codeine Rash    REACTION: [place] REACTION: [place]  . Hydrocod  Polst-Cpm Polst Er Rash  . Other Rash  . Propoxyphene Rash  . Sulfa Antibiotics Rash    REACTION: unspecified REACTION: unspecified    Past Medical History:  Diagnosis Date  . Allergy    Cipro, Plavix, Statins  . CAD (coronary artery disease)    a. s/p tandem Promus DES to LAD in 2009 by Dr. Olevia Perches;  b.  LHC (03/22/14):  prox LAD 30%, mid LAD 40%, LAD stent ok with dist 20% ISR, apical LAD occluded with L-L collats filling apical vessel (too small for PCI), mid RCA 20%, EF 70%.  Med Rx  . Diabetes mellitus    Diagnosed 2012  . Diverticulosis of colon (without mention of hemorrhage)   . GERD (gastroesophageal reflux disease)   . Hx of cardiovascular stress test    a. Nuclear (08/2013):  No ischemia, EF 80%, Normal  . Hx of echocardiogram    a. Echo (07/2013):  Mild LVH, vigorous LVF, EF 65-70%, Gr 1 DD, mild MR, mild to mod LAE  . Hyperlipidemia    intol of statins  . Hypertension   . NHL (non-Hodgkin's lymphoma) ([place]) dx'd 2003   chemo/xrt comp 2003  . Personal history of colonic polyps   . Proctitis   . Urticaria     Past Surgical History:  Procedure Laterality Date  . cardiac cath-neg    . CORONARY ANGIOPLASTY WITH STENT PLACEMENT     x 2  . dexa-neg    . KNEE SURGERY  10/2003   left  . laser surgery for cataracts-left    . LEFT HEART CATHETERIZATION WITH CORONARY ANGIOGRAM N/A 03/22/2014   Procedure: LEFT HEART CATHETERIZATION WITH CORONARY ANGIOGRAM;  Surgeon: Burnell Blanks, MD;  Location: [facility] CATH LAB;  Service: Cardiovascular;  Laterality: N/A;  . stress cardiolite    . VAGINAL HYSTERECTOMY     partial, fibroids, one ovary left    Family History  Problem Relation Age of Onset  . Cancer Mother     jaw  . Hypertension Father   . Heart attack Father   . Heart attack Sister   . Colon cancer Neg Hx     Social History   Social History  . Marital status: Married    Spouse name: N/A  . Number of children: N/A  . Years of education: N/A    Occupational History  . Retired Retired   Social History Main Topics  . Smoking status: Never Smoker  . Smokeless tobacco: Never Used  . Alcohol use No  . Drug use: No  .  Sexual activity: No   Other Topics Concern  . Not on file   Social History Narrative  . No narrative on file   The PMH, PSH, Social History, Family History, Medications, and allergies have been reviewed in [facility], and have been updated if relevant.  Review of Systems  Constitutional: Negative.   Cardiovascular: Negative.   Gastrointestinal: Negative.   Genitourinary: Negative.   Musculoskeletal: Positive for arthralgias and myalgias.  Neurological: Negative.   Psychiatric/Behavioral: Positive for sleep disturbance. Negative for agitation, behavioral problems, confusion, decreased concentration, dysphoric mood, hallucinations, self-injury and suicidal ideas. The patient is nervous/anxious. The patient is not hyperactive.   All other systems reviewed and are negative.      Objective:    BP 120/80   Pulse 67   Wt 177 lb 1.9 oz (80.3 kg)   SpO2 97%   BMI 26.16 kg/m    Physical Exam  Constitutional: She is oriented to person, place, and time. She appears well-developed and well-nourished. No distress.  HENT:  Head: Normocephalic.  Eyes: Conjunctivae are normal.  Cardiovascular: Normal rate.   Pulmonary/Chest: Effort normal.  Musculoskeletal:       Left hip: She exhibits decreased range of motion and tenderness. She exhibits no bony tenderness, no swelling, no crepitus, no deformity and no laceration.  Neurological: She is alert and oriented to person, place, and time. No cranial nerve deficit.  Skin: Skin is warm and dry. She is not diaphoretic.  Psychiatric: Her behavior is normal. Judgment and thought content normal.  Nursing note reviewed.         Assessment & Plan:   Left leg pain - Plan: Comprehensive metabolic panel, Vitamin D, 25-hydroxy  Left hip pain  Diabetes mellitus due to  underlying condition, controlled, with diabetic nephropathy, without long-term current use of insulin (HCC) - Plan: Hemoglobin A1c  Situational insomnia No Follow-up on file.

## 2017-01-26 NOTE — Patient Instructions (Signed)
Great to see you. We will call you with your lab results.   Try OTC miralax for constipation.  We are starting Trazodone for insomnia.  Please call me with an updated.  Happy Birthday!

## 2017-01-26 NOTE — Assessment & Plan Note (Signed)
  The problem of recurrent insomnia is discussed. Avoidance of caffeine sources is strongly encouraged. Sleep hygiene issues are reviewed.  eRx sent for trazodone and advised her to keep me updated with symptoms. The patient indicates understanding of these issues and agrees with the plan.

## 2017-01-31 ENCOUNTER — Encounter: Payer: Self-pay | Admitting: Cardiovascular Disease

## 2017-01-31 ENCOUNTER — Ambulatory Visit (INDEPENDENT_AMBULATORY_CARE_PROVIDER_SITE_OTHER): Payer: Medicare Other | Admitting: Cardiovascular Disease

## 2017-01-31 VITALS — BP 112/72 | HR 64 | Ht 69.0 in | Wt 179.0 lb

## 2017-01-31 DIAGNOSIS — E7800 Pure hypercholesterolemia, unspecified: Secondary | ICD-10-CM

## 2017-01-31 DIAGNOSIS — I2510 Atherosclerotic heart disease of native coronary artery without angina pectoris: Secondary | ICD-10-CM | POA: Diagnosis not present

## 2017-01-31 DIAGNOSIS — I10 Essential (primary) hypertension: Secondary | ICD-10-CM

## 2017-01-31 MED ORDER — EZETIMIBE 10 MG PO TABS: 10.0000 mg | ORAL_TABLET | Freq: Every day | ORAL | 3 refills | Status: DC

## 2017-01-31 MED ORDER — LOSARTAN POTASSIUM 50 MG PO TABS: 50.0000 mg | ORAL_TABLET | Freq: Every day | ORAL | 3 refills | Status: DC

## 2017-01-31 MED ORDER — ISOSORBIDE MONONITRATE ER 60 MG PO TB24: 60.0000 mg | ORAL_TABLET | Freq: Every day | ORAL | 3 refills | Status: DC

## 2017-01-31 MED ORDER — METOPROLOL SUCCINATE ER 100 MG PO TB24: 100.0000 mg | ORAL_TABLET | Freq: Every morning | ORAL | 3 refills | Status: DC

## 2017-01-31 NOTE — Progress Notes (Signed)
4  Chief Complaint  Patient presents with  . Essential hypertension    follow up      History of Present Illness: 83 yo female with history of CAD, HLD, HTN, non-Hodgkin's lymphoma, DM here today for cardiac follow up. In 2009 she had tandem non-overlapping Promus drug-eluting stents placed in the LAD. Last cardiac cath April 2015 with patent LAD stent, occluded apical LAD segment with left to left collaterals and non-obstructive disease elsewhere. She has been intolerant to statins and Plavix in the past. She has been on Zetia. Echo 01/20/16 with normal LV size and function, trivial aortic valve insufficiency. She has had bilateral foot pain and normal ABI in 2016. Last nuclear stress test October 2017 with no ischemia. She had chest pain that responded to increased dose of Imdur.   She is here for follow up. She is doing well. She has stable chest pains with exertion. This occurs once every few months. No changes in frequency or character. No SOB. She continues to have bilateral foot pain and leg pain.   Primary Care Physician: Arnette Norris, MD   Past Medical History:  Diagnosis Date  . Allergy    Cipro, Plavix, Statins  . CAD (coronary artery disease)    a. s/p tandem Promus DES to LAD in 2009 by Dr. Olevia Perches;  b.  LHC (03/22/14):  prox LAD 30%, mid LAD 40%, LAD stent ok with dist 20% ISR, apical LAD occluded with L-L collats filling apical vessel (too small for PCI), mid RCA 20%, EF 70%.  Med Rx  . Diabetes mellitus    Diagnosed 2012  . Diverticulosis of colon (without mention of hemorrhage)   . GERD (gastroesophageal reflux disease)   . Hx of cardiovascular stress test    a. Nuclear (08/2013):  No ischemia, EF 80%, Normal  . Hx of echocardiogram    a. Echo (07/2013):  Mild LVH, vigorous LVF, EF 65-70%, Gr 1 DD, mild MR, mild to mod LAE  . Hyperlipidemia    intol of statins  . Hypertension   . NHL (non-Hodgkin's lymphoma) ([place]) dx'd 2003   chemo/xrt comp 2003  . Personal history of  colonic polyps   . Proctitis   . Urticaria     Past Surgical History:  Procedure Laterality Date  . cardiac cath-neg    . CORONARY ANGIOPLASTY WITH STENT PLACEMENT     x 2  . dexa-neg    . KNEE SURGERY  10/2003   left  . laser surgery for cataracts-left    . LEFT HEART CATHETERIZATION WITH CORONARY ANGIOGRAM N/A 03/22/2014   Procedure: LEFT HEART CATHETERIZATION WITH CORONARY ANGIOGRAM;  Surgeon: Burnell Blanks, MD;  Location: [facility] CATH LAB;  Service: Cardiovascular;  Laterality: N/A;  . stress cardiolite    . VAGINAL HYSTERECTOMY     partial, fibroids, one ovary left    Current Outpatient Prescriptions  Medication Sig Dispense Refill  . aspirin (ASPIRIN LOW DOSE) 81 MG tablet Take 81 mg by mouth daily.      . Blood Glucose Monitoring Suppl ([place]) w/Device KIT 1 Device by Does not apply route once. 1 kit 0  . brimonidine-timolol (COMBIGAN) 0.2-0.5 % ophthalmic solution Place 1 drop into both eyes every 12 (twelve) hours.    . Cholecalciferol (VITAMIN D-3) 1000 UNITS CAPS Take 1 capsule by mouth daily.    Marland Kitchen ezetimibe (ZETIA) 10 MG tablet Take 1 tablet (10 mg total) by mouth daily. 90 tablet 3  . glipiZIDE (GLUCOTROL)  5 MG tablet TAKE ONE TABLET BY MOUTH TWICE A DAY. 180 tablet 1  . hydrOXYzine (VISTARIL) 25 MG capsule Take 1 capsule (25 mg total) by mouth 3 (three) times daily as needed for anxiety or itching. 90 capsule 0  . isosorbide mononitrate (IMDUR) 60 MG 24 hr tablet Take 1 tablet (60 mg total) by mouth daily. 90 tablet 3  . lansoprazole (PREVACID) 15 MG capsule TAKE ONE CAPSULE BY MOUTH EVERY DAY 90 capsule 2  . latanoprost (XALATAN) 0.005 % ophthalmic solution Place 1 drop into both eyes at bedtime.     Marland Kitchen losartan (COZAAR) 50 MG tablet Take 1 tablet (50 mg total) by mouth daily. 90 tablet 3  . metoprolol succinate (TOPROL-XL) 100 MG 24 hr tablet Take 1 tablet (100 mg total) by mouth every morning. Take with or immediately following a meal. 90  tablet 3  . nitroGLYCERIN (NITROSTAT) 0.4 MG SL tablet Place 1 tablet (0.4 mg total) under the tongue every 5 (five) minutes as needed for chest pain. 30 tablet 3  . ONE TOUCH LANCETS MISC 1 each by Does not apply route 2 (two) times daily.    . ONE TOUCH ULTRA TEST test strip TEST TWICE DAILY 100 each 2  . OVER THE COUNTER MEDICATION Take 1 tablet by mouth daily. Med name: VISION FORMULA    . traZODone (DESYREL) 50 MG tablet Take 0.5-1 tablets (25-50 mg total) by mouth at bedtime as needed for sleep. 30 tablet 3  . trimethoprim (TRIMPEX) 100 MG tablet Take 100 mg by mouth 2 (two) times daily as needed (UTI).     . vitamin B-12 (CYANOCOBALAMIN) 1000 MCG tablet Take 1,000 mcg by mouth daily.     Current Facility-Administered Medications  Medication Dose Route Frequency Provider Last Rate Last Dose  . betamethasone acetate-betamethasone sodium phosphate (CELESTONE) injection 3 mg  3 mg Intramuscular Once Edrick Kins, DPM        Allergies  Allergen Reactions  . Shellfish Allergy Anaphylaxis and Rash  . Cephalexin     REACTION: trash and swelling REACTION: trash and swelling  . Ciprofloxacin     REACTION: u/k  . Clopidogrel Bisulfate     REACTION: swelling, rash  . Codeine Phosphate     REACTION: unspecified  . Ezetimibe-Simvastatin     REACTION: myalgias REACTION: myalgias  . Hydrocod Polst-Cpm Polst Er     REACTION: u/k  . Lidocaine Hives    ONLY TO ADHESIVE LIDOCAINE PATCHES. NO PROBLEM WITH INJECTABLE LIDOCAINE.  Marland Kitchen Nitrofurantoin     REACTION: [place] REACTION: [place]  . Pregabalin     REACTION: felt bad REACTION: felt bad  . Propoxyphene N-Acetaminophen     REACTION: [place]  . Rofecoxib     unk unk   . Sulfamethoxazole     REACTION: unspecified  . Sulfonamide Derivatives     REACTION: unspecified  . Celecoxib Rash    REACTION: [place] REACTION: [place]  . Codeine Rash    REACTION: [place] REACTION: [place]  . Hydrocod Polst-Cpm Polst Er Rash  . Other Rash  . Propoxyphene Rash  . Sulfa  Antibiotics Rash    REACTION: unspecified REACTION: unspecified    Social History   Social History  . Marital status: Married    Spouse name: N/A  . Number of children: N/A  . Years of education: N/A   Occupational History  . Retired Retired   Social History Main Topics  . Smoking status: Never Smoker  . Smokeless tobacco: Never Used  .  Alcohol use No  . Drug use: No  . Sexual activity: No   Other Topics Concern  . Not on file   Social History Narrative  . No narrative on file    Family History  Problem Relation Age of Onset  . Cancer Mother     jaw  . Hypertension Father   . Heart attack Father   . Heart attack Sister   . Colon cancer Neg Hx     Review of Systems:  As stated in the HPI and otherwise negative.   BP 112/72   Pulse 64   Ht _0  (1.753 m)   Wt 179 lb (81.2 kg)   BMI 26.43 kg/m   Physical Examination: General: Well developed, well nourished, NAD  HEENT: OP clear, mucus membranes moist  SKIN: warm, dry. No rashes. Neuro: No focal deficits  Musculoskeletal: Muscle strength 5/5 all ext  Psychiatric: Mood and affect normal  Neck: No JVD, no carotid bruits, no thyromegaly, no lymphadenopathy.  Lungs:Clear bilaterally, no wheezes, rhonci, crackles Cardiovascular: Loletha Grayer, Regular rhythm. No murmurs, gallops or rubs. Abdomen:Soft. Bowel sounds present. Non-tender.  Extremities: No lower extremity edema. Pulses are 2 + in the bilateral DP/PT.  Echo 01/20/16: Left ventricle: The cavity size was normal. Wall thickness was   increased in a pattern of mild LVH. There was mild focal basal   hypertrophy of the septum. Systolic function was normal. The   estimated ejection fraction was in the range of 60% to 65%. Wall   motion was normal; there were no regional wall motion   abnormalities. Doppler parameters are consistent with abnormal   left ventricular relaxation (grade 1 diastolic dysfunction).   Doppler parameters are consistent with high  ventricular filling   pressure. - Aortic valve: There was trivial regurgitation. - Mitral valve: Calcified annulus.  Cardiac cath 03/22/14: Left main: No obstructive disease.  Left Anterior Descending Artery: Large caliber vessel that courses to the apex. 30% proximal stenosis. 40% mid stenosis just prior to the stented segment. Mid stented segment patent without restenosis. Distal stented segment patent with 20% restenosis. Apical LAD becomes very small in caliber and has total occlusion with filling of apical vessel by left to left collaterals. (0.75 mm vessel).  Circumflex Artery: Large caliber vessel with several small OM branches. Diffuse luminal irregularities in the mid vessel.  Right Coronary Artery: Large dominant vessel with 20% mid stenosis.  Left Ventricular Angiogram: LVEF=70%.   EKG:  EKG is not ordered today. The ekg ordered today demonstrates   Recent Labs: 01/26/2017: ALT 39; BUN 13; Creatinine, Ser 1.06; Potassium 4.7; Sodium 137   Lipid Panel    Component Value Date/Time   CHOL 216 (H) 01/14/2016 0934   TRIG 152.0 (H) 01/14/2016 0934   HDL 52.30 01/14/2016 0934   CHOLHDL 4 01/14/2016 0934   VLDL 30.4 01/14/2016 0934   LDLCALC 133 (H) 01/14/2016 0934   LDLDIRECT 93.0 08/06/2015 0847     Wt Readings from Last 3 Encounters:  01/31/17 179 lb (81.2 kg)  01/26/17 177 lb 1.9 oz (80.3 kg)  10/18/16 172 lb (78 kg)     Other studies Reviewed: Additional studies/ records that were reviewed today include: . Review of the above records demonstrates:    Assessment and Plan:   1. CAD with stable angina: She has no change in angina. Continue Zetia, ASA, Toprol and Imdur. She is intolerant to statins.   2. Hyperlipidemia: Will continue Zetia. She is intolerant of statins.   3.  HTN: BP controlled. Continue current meds.    Current medicines are reviewed at length with the patient today.  The patient does not have concerns regarding medicines.  The following  changes have been made:  no change  Labs/ tests ordered today include:   No orders of the defined types were placed in this encounter.   Disposition:   FU with me in 6 months  Signed, Lauree Chandler, MD 01/31/2017 11:23 AM    [facility] [address], [place], [place]  [address] Phone: [phone]; Fax: [phone]

## 2017-01-31 NOTE — Patient Instructions (Signed)

## 2017-02-10 ENCOUNTER — Other Ambulatory Visit: Payer: Self-pay | Admitting: Family Medicine

## 2017-02-10 MED ORDER — OMEPRAZOLE-SODIUM BICARBONATE 40-1100 MG PO CAPS: 1.0000 | ORAL_CAPSULE | Freq: Every day | ORAL | 3 refills | Status: DC

## 2017-02-10 NOTE — Telephone Encounter (Signed)
Yes, I will send the rx. I spoke to the pharmacy to make sure she was only doing Zegerid and not both.

## 2017-02-10 NOTE — Telephone Encounter (Signed)
Pharmacy sent a refill request for lansoprazole and put this statement with the refill: MRS [facility] IS HAVING SOME BREAKTHROUGH ACID REFLUX, CAN WEPLEASE TRY ZEGERID?  IT IS BACK IN STOCK AND INS COVERS.

## 2017-02-10 NOTE — Telephone Encounter (Signed)
Yes It's ok to substitute zegerid.  Do they need a new rx?

## 2017-03-07 ENCOUNTER — Encounter (INDEPENDENT_AMBULATORY_CARE_PROVIDER_SITE_OTHER): Payer: Self-pay

## 2017-03-07 ENCOUNTER — Encounter: Payer: Self-pay | Admitting: Primary Care

## 2017-03-07 ENCOUNTER — Ambulatory Visit (INDEPENDENT_AMBULATORY_CARE_PROVIDER_SITE_OTHER): Payer: Medicare Other | Admitting: Primary Care

## 2017-03-07 VITALS — BP 140/88 | HR 74 | Temp 98.9°F | Ht 69.0 in | Wt 174.1 lb

## 2017-03-07 DIAGNOSIS — J302 Other seasonal allergic rhinitis: Secondary | ICD-10-CM

## 2017-03-07 NOTE — Progress Notes (Signed)
Subjective:    Patient ID: [name] [name], female    DOB: Jun 18, 1932, 83 y.o.   MRN: [id number]  HPI  [name] [name] is an 83 year old female with a history of GERD, mitral valve insufficiency who presents today with a chief complaint of cough. She also reports nasal congestion, sore throat, facial pain, sinus pressure, rhinorrhea, watery eyes. Her symptoms began 2 days ago. Her cough is non productive. She's been taking tylenol and benadryl without improvement in pain or sleep. She's not slept well in two days. She denies fevers, chills, sick contacts, nausea, shortness of breath.   Review of Systems  Constitutional: Negative for chills and fever.  HENT: Positive for congestion, postnasal drip, rhinorrhea, sinus pain, sinus pressure and sore throat.   Eyes:       Watery eyes  Respiratory: Positive for cough. Negative for shortness of breath and wheezing.   Cardiovascular: Negative for chest pain.       Past Medical History:  Diagnosis Date  . Allergy    Cipro, Plavix, Statins  . CAD (coronary artery disease)    a. s/p tandem Promus DES to LAD in 2009 by Dr. Olevia Perches;  b.  LHC (03/22/14):  prox LAD 30%, mid LAD 40%, LAD stent ok with dist 20% ISR, apical LAD occluded with L-L collats filling apical vessel (too small for PCI), mid RCA 20%, EF 70%.  Med Rx  . Diabetes mellitus    Diagnosed 2012  . Diverticulosis of colon (without mention of hemorrhage)   . GERD (gastroesophageal reflux disease)   . Hx of cardiovascular stress test    a. Nuclear (08/2013):  No ischemia, EF 80%, Normal  . Hx of echocardiogram    a. Echo (07/2013):  Mild LVH, vigorous LVF, EF 65-70%, Gr 1 DD, mild MR, mild to mod LAE  . Hyperlipidemia    intol of statins  . Hypertension   . NHL (non-Hodgkin's lymphoma) ([place]) dx'd 2003   chemo/xrt comp 2003  . Personal history of colonic polyps   . Proctitis   . Urticaria      Social History   Social History  . Marital status: Married    Spouse name: N/A  . Number of  children: N/A  . Years of education: N/A   Occupational History  . Retired Retired   Social History Main Topics  . Smoking status: Never Smoker  . Smokeless tobacco: Never Used  . Alcohol use No  . Drug use: No  . Sexual activity: No   Other Topics Concern  . Not on file   Social History Narrative  . No narrative on file    Past Surgical History:  Procedure Laterality Date  . cardiac cath-neg    . CORONARY ANGIOPLASTY WITH STENT PLACEMENT     x 2  . dexa-neg    . KNEE SURGERY  10/2003   left  . laser surgery for cataracts-left    . LEFT HEART CATHETERIZATION WITH CORONARY ANGIOGRAM N/A 03/22/2014   Procedure: LEFT HEART CATHETERIZATION WITH CORONARY ANGIOGRAM;  Surgeon: Burnell Blanks, MD;  Location: [facility] CATH LAB;  Service: Cardiovascular;  Laterality: N/A;  . stress cardiolite    . VAGINAL HYSTERECTOMY     partial, fibroids, one ovary left    Family History  Problem Relation Age of Onset  . Cancer Mother     jaw  . Hypertension Father   . Heart attack Father   . Heart attack Sister   . Colon cancer  Neg Hx     Allergies  Allergen Reactions  . Shellfish Allergy Anaphylaxis and Rash  . Cephalexin     REACTION: trash and swelling REACTION: trash and swelling  . Ciprofloxacin     REACTION: u/k  . Clopidogrel Bisulfate     REACTION: swelling, rash  . Codeine Phosphate     REACTION: unspecified  . Ezetimibe-Simvastatin     REACTION: myalgias REACTION: myalgias  . Hydrocod Polst-Cpm Polst Er     REACTION: u/k  . Lidocaine Hives    ONLY TO ADHESIVE LIDOCAINE PATCHES. NO PROBLEM WITH INJECTABLE LIDOCAINE.  Marland Kitchen Nitrofurantoin     REACTION: [place] REACTION: [place]  . Pregabalin     REACTION: felt bad REACTION: felt bad  . Propoxyphene N-Acetaminophen     REACTION: [place]  . Rofecoxib     unk unk   . Sulfamethoxazole     REACTION: unspecified  . Sulfonamide Derivatives     REACTION: unspecified  . Celecoxib Rash    REACTION: [place] REACTION: [place]  . Codeine  Rash    REACTION: [place] REACTION: [place]  . Hydrocod Polst-Cpm Polst Er Rash  . Other Rash  . Propoxyphene Rash  . Sulfa Antibiotics Rash    REACTION: unspecified REACTION: unspecified    Current Outpatient Prescriptions on File Prior to Visit  Medication Sig Dispense Refill  . aspirin (ASPIRIN LOW DOSE) 81 MG tablet Take 81 mg by mouth daily.      . Blood Glucose Monitoring Suppl ([place]) w/Device KIT 1 Device by Does not apply route once. 1 kit 0  . brimonidine-timolol (COMBIGAN) 0.2-0.5 % ophthalmic solution Place 1 drop into both eyes every 12 (twelve) hours.    . Cholecalciferol (VITAMIN D-3) 1000 UNITS CAPS Take 1 capsule by mouth daily.    Marland Kitchen ezetimibe (ZETIA) 10 MG tablet Take 1 tablet (10 mg total) by mouth daily. 90 tablet 3  . glipiZIDE (GLUCOTROL) 5 MG tablet TAKE ONE TABLET BY MOUTH TWICE A DAY. 180 tablet 1  . hydrOXYzine (VISTARIL) 25 MG capsule Take 1 capsule (25 mg total) by mouth 3 (three) times daily as needed for anxiety or itching. 90 capsule 0  . isosorbide mononitrate (IMDUR) 60 MG 24 hr tablet Take 1 tablet (60 mg total) by mouth daily. 90 tablet 3  . lansoprazole (PREVACID) 15 MG capsule TAKE ONE CAPSULE BY MOUTH EVERY DAY 90 capsule 2  . latanoprost (XALATAN) 0.005 % ophthalmic solution Place 1 drop into both eyes at bedtime.     Marland Kitchen losartan (COZAAR) 50 MG tablet Take 1 tablet (50 mg total) by mouth daily. 90 tablet 3  . metoprolol succinate (TOPROL-XL) 100 MG 24 hr tablet Take 1 tablet (100 mg total) by mouth every morning. Take with or immediately following a meal. 90 tablet 3  . nitroGLYCERIN (NITROSTAT) 0.4 MG SL tablet Place 1 tablet (0.4 mg total) under the tongue every 5 (five) minutes as needed for chest pain. 30 tablet 3  . omeprazole-sodium bicarbonate (ZEGERID) 40-1100 MG capsule Take 1 capsule by mouth daily before breakfast. 90 capsule 3  . ONE TOUCH LANCETS MISC 1 each by Does not apply route 2 (two) times daily.    . ONE TOUCH ULTRA TEST  test strip TEST TWICE DAILY 100 each 2  . OVER THE COUNTER MEDICATION Take 1 tablet by mouth daily. Med name: VISION FORMULA    . traZODone (DESYREL) 50 MG tablet Take 0.5-1 tablets (25-50 mg total) by mouth at bedtime as needed  for sleep. 30 tablet 3  . trimethoprim (TRIMPEX) 100 MG tablet Take 100 mg by mouth 2 (two) times daily as needed (UTI).     . vitamin B-12 (CYANOCOBALAMIN) 1000 MCG tablet Take 1,000 mcg by mouth daily.    . [DISCONTINUED] loratadine (CLARITIN) 10 MG tablet Take 10 mg by mouth as needed.      Current Facility-Administered Medications on File Prior to Visit  Medication Dose Route Frequency Provider Last Rate Last Dose  . betamethasone acetate-betamethasone sodium phosphate (CELESTONE) injection 3 mg  3 mg Intramuscular Once Edrick Kins, DPM        BP 140/88   Pulse 74   Temp 98.9 F (37.2 C) (Oral)   Ht 5' 9"  (1.753 m)   Wt 174 lb 1.9 oz (79 kg)   SpO2 95%   BMI 25.71 kg/m    Objective:   Physical Exam  Constitutional: She appears well-nourished.  HENT:  Right Ear: Tympanic membrane and ear canal normal.  Left Ear: Tympanic membrane and ear canal normal.  Nose: Mucosal edema present. Right sinus exhibits maxillary sinus tenderness. Right sinus exhibits no frontal sinus tenderness. Left sinus exhibits maxillary sinus tenderness. Left sinus exhibits no frontal sinus tenderness.  Mouth/Throat: Oropharynx is clear and moist.  Eyes: Conjunctivae are normal.  Neck: Neck supple.  Cardiovascular: Normal rate and regular rhythm.   Pulmonary/Chest: Effort normal and breath sounds normal. She has no wheezes. She has no rales.  Lymphadenopathy:    She has no cervical adenopathy.  Skin: Skin is warm and dry.          Assessment & Plan:  Allergic Rhinitis:  Symptoms x 2 days. Little improvement with OTC treatment. Exam today stable, consistent for allergy involvement. Suspect cough secondary to post nasal drip. Does not appear ill. Will treat with Zyrtec  HS to help with symptoms plus difficulty sleeping. Discussed use of Flonase twice daily. She will update if symptoms progress. At this point she does not show evidence for any infectious process.  Sheral Flow, NP

## 2017-03-07 NOTE — Patient Instructions (Signed)
Your symptoms are related to allergies. You may also have the beginning of a viral infection.  Start cetirizine (Zyrtec) tablets once daily for runny notes, watery eyes, drainage.  Nasal Congestion/Sinus Pressure: Try using Flonase (fluticasone) nasal spray. Instill 1 spray in each nostril twice daily.   If your cough becomes worse, you may try Delsym or Robitussin, these may be purchased over the counter.   Please notify me if you develop persistent fevers of 101, start coughing up green mucous, notice increased fatigue or weakness, or feel worse after 1 week of onset of symptoms.   Increase consumption of water intake and rest.  It was a pleasure meeting you!

## 2017-03-07 NOTE — Progress Notes (Signed)
Pre visit review using our clinic review tool, if applicable. No additional management support is needed unless otherwise documented below in the visit note. 

## 2017-04-06 ENCOUNTER — Ambulatory Visit: Payer: Medicare Other | Admitting: Family Medicine

## 2017-04-07 ENCOUNTER — Ambulatory Visit: Payer: Medicare Other | Admitting: Family Medicine

## 2017-04-12 ENCOUNTER — Ambulatory Visit: Payer: Medicare Other

## 2017-04-12 ENCOUNTER — Ambulatory Visit (INDEPENDENT_AMBULATORY_CARE_PROVIDER_SITE_OTHER): Payer: Medicare Other | Admitting: Podiatry

## 2017-04-12 DIAGNOSIS — M25571 Pain in right ankle and joints of right foot: Secondary | ICD-10-CM

## 2017-04-12 DIAGNOSIS — M79672 Pain in left foot: Secondary | ICD-10-CM

## 2017-04-12 DIAGNOSIS — M65971 Unspecified synovitis and tenosynovitis, right ankle and foot: Secondary | ICD-10-CM

## 2017-04-12 DIAGNOSIS — M659 Synovitis and tenosynovitis, unspecified: Secondary | ICD-10-CM

## 2017-04-12 DIAGNOSIS — M7751 Other enthesopathy of right foot: Secondary | ICD-10-CM

## 2017-04-13 ENCOUNTER — Ambulatory Visit: Payer: Medicare Other | Admitting: Family Medicine

## 2017-04-13 NOTE — Progress Notes (Signed)
   Subjective:  Patient presents today for follow-up evaluation of pain and tenderness to the right ankle. Patient relates significant pain and tenderness when walking. She reports a new complaint of pain to the heels, forefoot and arches of bilateral feet that has been ongoing for years. She reports associated numbness, tingling in the toes. Walking increases the pain. She has been taking Tylenol with some temporary relief. Patient presents for further treatment and evaluation.    Objective / Physical Exam:  General:  The patient is alert and oriented x3 in no acute distress. Dermatology:  Skin is warm, dry and supple bilateral lower extremities. Negative for open lesions or macerations. Vascular:  Palpable pedal pulses bilaterally. No edema or erythema noted. Capillary refill within normal limits. Neurological:  Epicritic and protective threshold grossly intact bilaterally.  Musculoskeletal Exam:  Pain on palpation to the anterior lateral medial aspects of the patient's right ankle. Mild edema noted. Pain with palpation to the 2nd MPJ of the right foot. Range of motion within normal limits to all pedal and ankle joints bilateral. Muscle strength 5/5 in all groups bilateral.   Radiographic Exam:  Normal osseous mineralization. Joint spaces preserved. No fracture/dislocation/boney destruction.    Assessment: #1 pain in right ankle #2 second MPJ capsulitis-right  Plan of Care:  #1 Patient was evaluated. X-rays reviewed. #2 injection of 0.5 mL Celestone Soluspan injected in the patient's right ankle. #3 Injection of 0.5 mLs Celestone Soluspan injected into second MPJ of the right foot. #4 compression anklet dispensed. #5 Silicone toe caps dispensed. #6 patient is to return to clinic when necessary  Edrick Kins, DPM [facility]  Dr. Edrick Kins, [address]                                        [place], [place] [address]                Office [phone]  Fax [phone]

## 2017-04-16 MED ORDER — BETAMETHASONE SOD PHOS & ACET 6 (3-3) MG/ML IJ SUSP: 3.0000 mg | Freq: Once | INTRAMUSCULAR | Status: DC

## 2017-04-27 ENCOUNTER — Ambulatory Visit (INDEPENDENT_AMBULATORY_CARE_PROVIDER_SITE_OTHER): Payer: Medicare Other | Admitting: Family Medicine

## 2017-04-27 ENCOUNTER — Encounter: Payer: Self-pay | Admitting: Family Medicine

## 2017-04-27 ENCOUNTER — Ambulatory Visit (INDEPENDENT_AMBULATORY_CARE_PROVIDER_SITE_OTHER): Payer: Medicare Other

## 2017-04-27 VITALS — BP 110/70 | HR 72 | Temp 97.8°F | Ht 68.0 in | Wt 171.0 lb

## 2017-04-27 DIAGNOSIS — M25552 Pain in left hip: Secondary | ICD-10-CM

## 2017-04-27 DIAGNOSIS — M7062 Trochanteric bursitis, left hip: Secondary | ICD-10-CM | POA: Diagnosis not present

## 2017-04-27 DIAGNOSIS — Z0000 Encounter for general adult medical examination without abnormal findings: Secondary | ICD-10-CM | POA: Diagnosis not present

## 2017-04-27 MED ORDER — METHYLPREDNISOLONE ACETATE 40 MG/ML IJ SUSP
80.0000 mg | Freq: Once | INTRAMUSCULAR | Status: AC
  Administered 2017-04-27: 80 mg via INTRA_ARTICULAR

## 2017-04-27 NOTE — Patient Instructions (Signed)
[name] , Thank you for taking time to come for your Medicare Wellness Visit. I appreciate your ongoing commitment to your health goals. Please review the following plan we discussed and let me know if I can assist you in the future.   These are the goals we discussed: Goals    . Increase physical activity          Starting 04/27/17, I will continue to walk for at least 15 min 3 days per week.         This is a list of the screening recommended for you and due dates:  Health Maintenance  Topic Date Due  . Tetanus Vaccine  05/29/2019*  . Flu Shot  06/29/2017  . Hemoglobin A1C  07/26/2017  . Eye exam for diabetics  02/27/2018  . Complete foot exam   04/12/2018  . Mammogram  04/27/2018  . DEXA scan (bone density measurement)  Completed  . Pneumonia vaccines  Completed  *Topic was postponed. The date shown is not the original due date.   Preventive Care for Adults  A healthy lifestyle and preventive care can promote health and wellness. Preventive health guidelines for adults include the following key practices.  . A routine yearly physical is a good way to check with your health care provider about your health and preventive screening. It is a chance to share any concerns and updates on your health and to receive a thorough exam.  . Visit your dentist for a routine exam and preventive care every 6 months. Brush your teeth twice a day and floss once a day. Good oral hygiene prevents tooth decay and gum disease.  . The frequency of eye exams is based on your age, health, family medical history, use  of contact lenses, and other factors. Follow your health care provider's ecommendations for frequency of eye exams.  . Eat a healthy diet. Foods like vegetables, fruits, whole grains, low-fat dairy products, and lean protein foods contain the nutrients you need without too many calories. Decrease your intake of foods high in solid fats, added sugars, and salt. Eat the right amount of  calories for you. Get information about a proper diet from your health care provider, if necessary.  . Regular physical exercise is one of the most important things you can do for your health. Most adults should get at least 150 minutes of moderate-intensity exercise (any activity that increases your heart rate and causes you to sweat) each week. In addition, most adults need muscle-strengthening exercises on 2 or more days a week.  Silver Sneakers may be a benefit available to you. To determine eligibility, you may visit the website: www.silversneakers.com or contact program at 406-002-6732 Mon-Fri between 8AM-8PM.   . Maintain a healthy weight. The body mass index (BMI) is a screening tool to identify possible weight problems. It provides an estimate of body fat based on height and weight. Your health care provider can find your BMI and can help you achieve or maintain a healthy weight.   For adults 20 years and older: ? A BMI below 18.5 is considered underweight. ? A BMI of 18.5 to 24.9 is normal. ? A BMI of 25 to 29.9 is considered overweight. ? A BMI of 30 and above is considered obese.   . Maintain normal blood lipids and cholesterol levels by exercising and minimizing your intake of saturated fat. Eat a balanced diet with plenty of fruit and vegetables. Blood tests for lipids and cholesterol should begin at  age 63 and be repeated every 5 years. If your lipid or cholesterol levels are high, you are over 50, or you are at high risk for heart disease, you may need your cholesterol levels checked more frequently. Ongoing high lipid and cholesterol levels should be treated with medicines if diet and exercise are not working.  . If you smoke, find out from your health care provider how to quit. If you do not use tobacco, please do not start.  . If you choose to drink alcohol, please do not consume more than 2 drinks per day. One drink is considered to be 12 ounces (355 mL) of beer, 5 ounces  (148 mL) of wine, or 1.5 ounces (44 mL) of liquor.  . If you are 39-71 years old, ask your health care provider if you should take aspirin to prevent strokes.  . Use sunscreen. Apply sunscreen liberally and repeatedly throughout the day. You should seek shade when your shadow is shorter than you. Protect yourself by wearing long sleeves, pants, a wide-brimmed hat, and sunglasses year round, whenever you are outdoors.  . Once a month, do a whole body skin exam, using a mirror to look at the skin on your back. Tell your health care provider of new moles, moles that have irregular borders, moles that are larger than a pencil eraser, or moles that have changed in shape or color.

## 2017-04-27 NOTE — Progress Notes (Signed)
I reviewed health advisor's note, was available for consultation, and agree with documentation and plan.   Signed,  Cambry Spampinato T. Jeannette Maddy, MD  

## 2017-04-27 NOTE — Progress Notes (Signed)
Pre visit review using our clinic review tool, if applicable. No additional management support is needed unless otherwise documented below in the visit note. 

## 2017-04-27 NOTE — Progress Notes (Signed)
Subjective:   [name] [name] is a 83 y.o. female who presents for Medicare Annual (Subsequent) preventive examination.  Review of Systems:  N/A Cardiac Risk Factors include: advanced age (>26mn, >>67women);diabetes mellitus     Objective:     Vitals: BP 110/70 (BP Location: Right Arm, Patient Position: Sitting, Cuff Size: Normal)   Pulse 72   Temp 97.8 F (36.6 C) (Oral)   Ht 5' 8"  (1.727 m) Comment: no shoes  Wt 171 lb (77.6 kg)   SpO2 96%   BMI 26.00 kg/m   Body mass index is 26 kg/m.   Tobacco History  Smoking Status  . Never Smoker  Smokeless Tobacco  . Never Used     Counseling given: No   Past Medical History:  Diagnosis Date  . Allergy    Cipro, Plavix, Statins  . CAD (coronary artery disease)    a. s/p tandem Promus DES to LAD in 2009 by Dr. BOlevia Perches  b.  LHC (03/22/14):  prox LAD 30%, mid LAD 40%, LAD stent ok with dist 20% ISR, apical LAD occluded with L-L collats filling apical vessel (too small for PCI), mid RCA 20%, EF 70%.  Med Rx  . Diabetes mellitus    Diagnosed 2012  . Diverticulosis of colon (without mention of hemorrhage)   . GERD (gastroesophageal reflux disease)   . Hx of cardiovascular stress test    a. Nuclear (08/2013):  No ischemia, EF 80%, Normal  . Hx of echocardiogram    a. Echo (07/2013):  Mild LVH, vigorous LVF, EF 65-70%, Gr 1 DD, mild MR, mild to mod LAE  . Hyperlipidemia    intol of statins  . Hypertension   . NHL (non-Hodgkin's lymphoma) [place] dx[place] 2003   chemo/xrt comp 2003  . Personal history of colonic polyps   . Proctitis   . Urticaria    Past Surgical History:  Procedure Laterality Date  . cardiac cath-neg    . CORONARY ANGIOPLASTY WITH STENT PLACEMENT     x 2  . dexa-neg    . KNEE SURGERY  10/2003   left  . laser surgery for cataracts-left    . LEFT HEART CATHETERIZATION WITH CORONARY ANGIOGRAM N/A 03/22/2014   Procedure: LEFT HEART CATHETERIZATION WITH CORONARY ANGIOGRAM;  Surgeon: CBurnell Blanks MD;   [facility];  Service: Cardiovascular;  Laterality: N/A;  . stress cardiolite    . VAGINAL HYSTERECTOMY     partial, fibroids, one ovary left   Family History  Problem Relation Age of Onset  . Cancer Mother        jaw  . Hypertension Father   . Heart attack Father   . Heart attack Sister   . Colon cancer Neg Hx    History  Sexual Activity  . Sexual activity: No    Outpatient Encounter Prescriptions as of 04/27/2017  Medication Sig  . aspirin (ASPIRIN LOW DOSE) 81 MG tablet Take 81 mg by mouth daily.    . Blood Glucose Monitoring Suppl ([place] w/Device KIT 1 Device by Does not apply route once.  . brimonidine-timolol (COMBIGAN) 0.2-0.5 % ophthalmic solution Place 1 drop into both eyes every 12 (twelve) hours.  . Cholecalciferol (VITAMIN D-3) 1000 UNITS CAPS Take 1 capsule by mouth daily.  .Marland Kitchenezetimibe (ZETIA) 10 MG tablet Take 1 tablet (10 mg total) by mouth daily.  .Marland KitchenglipiZIDE (GLUCOTROL) 5 MG tablet TAKE ONE TABLET BY MOUTH TWICE A DAY.  . hydrOXYzine (VISTARIL) 25  MG capsule Take 1 capsule (25 mg total) by mouth 3 (three) times daily as needed for anxiety or itching.  . isosorbide mononitrate (IMDUR) 60 MG 24 hr tablet Take 1 tablet (60 mg total) by mouth daily.  . lansoprazole (PREVACID) 15 MG capsule TAKE ONE CAPSULE BY MOUTH EVERY DAY  . latanoprost (XALATAN) 0.005 % ophthalmic solution Place 1 drop into both eyes at bedtime.   Marland Kitchen losartan (COZAAR) 50 MG tablet Take 1 tablet (50 mg total) by mouth daily.  . metoprolol succinate (TOPROL-XL) 100 MG 24 hr tablet Take 1 tablet (100 mg total) by mouth every morning. Take with or immediately following a meal.  . nitroGLYCERIN (NITROSTAT) 0.4 MG SL tablet Place 1 tablet (0.4 mg total) under the tongue every 5 (five) minutes as needed for chest pain.  Marland Kitchen omeprazole-sodium bicarbonate (ZEGERID) 40-1100 MG capsule Take 1 capsule by mouth daily before breakfast.  . ONE TOUCH LANCETS MISC 1 each by Does not  apply route 2 (two) times daily.  . ONE TOUCH ULTRA TEST test strip TEST TWICE DAILY  . OVER THE COUNTER MEDICATION Take 1 tablet by mouth daily. Med name: VISION FORMULA  . traZODone (DESYREL) 50 MG tablet Take 0.5-1 tablets (25-50 mg total) by mouth at bedtime as needed for sleep.  Marland Kitchen trimethoprim (TRIMPEX) 100 MG tablet Take 100 mg by mouth 2 (two) times daily as needed (UTI).   . vitamin B-12 (CYANOCOBALAMIN) 1000 MCG tablet Take 1,000 mcg by mouth daily.   Facility-Administered Encounter Medications as of 04/27/2017  Medication  . betamethasone acetate-betamethasone sodium phosphate (CELESTONE) injection 3 mg  . betamethasone acetate-betamethasone sodium phosphate (CELESTONE) injection 3 mg    Activities of Daily Living In your present state of health, do you have any difficulty performing the following activities: 04/27/2017  Hearing? N  Vision? Y  Difficulty concentrating or making decisions? N  Walking or climbing stairs? Y  Dressing or bathing? N  Doing errands, shopping? N  Preparing Food and eating ? N  Using the Toilet? N  In the past six months, have you accidently leaked urine? N  Do you have problems with loss of bowel control? N  Managing your Medications? N  Managing your Finances? N  Housekeeping or managing your Housekeeping? N  Some recent data might be hidden    Patient Care Team: Lucille Passy, MD as PCP - General Rana Snare, MD as Consulting Physician (Urology) Ladene Artist, MD as Consulting Physician (Gastroenterology) Thelma Comp, OD as Consulting Physician (Optometry)    Assessment:    PLEASE NOTE: A Mini-Cog screen was completed. Maximum score is 20. A value of 0 denotes this part of [place]stein MMSE was not completed or the patient failed this part of the Mini-Cog screening.   Mini-Cog Screening Orientation to Time - Max 5 pts Orientation to Place - Max 5 pts Registration - Max 3 pts Recall - Max 3 pts Language Repeat - Max 1  pts Language Follow 3 Step Command - Max 3 pts  Exercise Activities and Dietary recommendations Current Exercise Habits: Home exercise routine, Type of exercise: walking, Time (Minutes): 15, Frequency (Times/Week): 3, Weekly Exercise (Minutes/Week): 45, Intensity: Mild, Exercise limited by: orthopedic condition(s)  Goals    . Increase physical activity          Starting 04/27/17, I will continue to walk for at least 15 min 3 days per week.        Fall Risk Fall Risk  04/27/2017 03/22/2016  Falls  in the past year? No No   Depression Screen PHQ 2/9 Scores 04/27/2017 03/22/2016 10/02/2012  PHQ - 2 Score 0 0 0     Cognitive Function MMSE - Mini Mental State Exam 04/27/2017 03/22/2016  Orientation to time 5 5  Orientation to Place 5 5  Registration 3 3  Attention/ Calculation 0 0  Recall 3 3  Language- name 2 objects 0 0  Language- repeat 1 1  Language- follow 3 step command 3 3  Language- read & follow direction 0 0  Write a sentence 0 0  Copy design 0 0  Total score 20 20     PLEASE NOTE: A Mini-Cog screen was completed. Maximum score is 20. A value of 0 denotes this part of Folstein MMSE was not completed or the patient failed this part of the Mini-Cog screening.   Mini-Cog Screening Orientation to Time - Max 5 pts Orientation to Place - Max 5 pts Registration - Max 3 pts Recall - Max 3 pts Language Repeat - Max 1 pts Language Follow 3 Step Command - Max 3 pts     Immunization History  Administered Date(s) Administered  . Influenza Split 09/14/2011, 08/28/2012  . Influenza Whole 09/09/2004, 09/05/2006, 08/31/2007, 08/29/2008, 09/18/2009, 09/01/2010  . Influenza,inj,Quad PF,36+ Mos 08/26/2014, 08/06/2015, 09/14/2016  . Pneumococcal Conjugate-13 08/26/2014  . Pneumococcal Polysaccharide-23 09/09/2004  . Td 05/30/1999  . Zoster 04/15/2010   Screening Tests Health Maintenance  Topic Date Due  . TETANUS/TDAP  05/29/2019 (Originally 05/29/2009)  . INFLUENZA VACCINE   06/29/2017  . HEMOGLOBIN A1C  07/26/2017  . OPHTHALMOLOGY EXAM  02/27/2018  . FOOT EXAM  04/12/2018  . MAMMOGRAM  04/27/2018  . DEXA SCAN  Completed  . PNA vac Low Risk Adult  Completed      Plan:     I have personally reviewed and addressed the Medicare Annual Wellness questionnaire and have noted the following in the patient's chart:  A. Medical and social history B. Use of alcohol, tobacco or illicit drugs  C. Current medications and supplements D. Functional ability and status E.  Nutritional status F.  Physical activity G. Advance directives H. List of other physicians I.  Hospitalizations, surgeries, and ER visits in previous 12 months J.  [place] to include hearing, vision, cognitive, depression L. Referrals and appointments - none  In addition, I have reviewed and discussed with patient certain preventive protocols, quality metrics, and best practice recommendations. A written personalized care plan for preventive services as well as general preventive health recommendations were provided to patient.  See attached scanned questionnaire for additional information.   Signed,   Lindell Noe, MHA, BS, LPN Health Coach

## 2017-04-27 NOTE — Progress Notes (Signed)
Dr. Frederico Hamman T. Elmore Hyslop, MD, [facility] [place], [address] Phone: [phone] Fax: [phone]  04/27/2017  Patient: [name] [name], MRN: [id number], DOB: September 10, 1932, 83 y.o.  Primary Physician:  Lucille Passy, MD   Chief Complaint  Patient presents with  . Hip Pain    Left   Subjective:   [name] [name] is a 83 y.o. very pleasant female patient who presents with the following:  Very pleasant lady who I've seen last approximately 6 months ago who is a history of recurrent left-sided trochanteric bursitis. She has no significant hip pain, no significant intra-articular pain, with grossly normal x-rays, no significant back injuries or pain. Her strength is been good, she has done formal physical therapy, and she has failed essentially all conservative measures and we have been doing trochanteric bursa injections approximately 2 times per year for pain management.  Past Medical History, Surgical History, Social History, Family History, Problem List, Medications, and Allergies have been reviewed and updated if relevant.  Patient Active Problem List   Diagnosis Date Noted  . Situational insomnia 01/26/2017  . Left leg pain 07/05/2016  . Right foot injury 07/05/2016  . Stress due to illness of family member 05/04/2016  . Hives 05/04/2016  . Dysuria 01/14/2016  . Pain in joints of both feet 01/14/2016  . Osteopenia 08/06/2015  . Constipation 12/30/2014  . B12 deficiency 12/30/2014  . Allergic reaction 09/02/2014  . Arthralgia of right foot 07/18/2014  . Pruritic intertrigo 07/18/2014  . Right hip pain 06/04/2014  . Other malaise and fatigue 06/04/2014  . Elevated LFTs 04/19/2014  . Mitral regurgitation 04/19/2014  . Burning sensation of feet 06/13/2013  . Pes planus 06/13/2013  . Left hip pain 10/02/2012  . Renal insufficiency 06/07/2011  . Diabetes mellitus with renal manifestations, controlled ([place]) 05/31/2011  .  MIXED HYPERLIPIDEMIA 08/04/2010  . BEN HTN HEART DISEASE WITHOUT HEART FAIL 08/04/2010  . LUMBAR SPRAIN AND STRAIN 01/15/2010  . VERTIGO 10/29/2009  . OSTEOPENIA 04/29/2009  . 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 02/25/2009  . EXTERNAL HEMORRHOIDS 02/24/2009  . DIVERTICULOSIS, COLON 02/24/2009  . HEMOPTYSIS 09/02/2008  . BACK PAIN, THORACIC REGION 04/30/2008  . LYMPHOMA, MALT 08/28/2007  . DETACHED RETINA 08/28/2007  . LABILE HYPERTENSION 08/28/2007  . Coronary atherosclerosis 08/28/2007  . MITRAL VALVE PROLAPSE 08/28/2007  . GERD 08/28/2007  . IBS 08/28/2007  . BREAST CYSTS, BILATERAL 08/28/2007  . LOW BACK PAIN, CHRONIC 06/29/2007    Past Medical History:  Diagnosis Date  . Allergy    Cipro, Plavix, Statins  . CAD (coronary artery disease)    a. s/p tandem Promus DES to LAD in 2009 by Dr. Olevia Perches;  b.  LHC (03/22/14):  prox LAD 30%, mid LAD 40%, LAD stent ok with dist 20% ISR, apical LAD occluded with L-L collats filling apical vessel (too small for PCI), mid RCA 20%, EF 70%.  Med Rx  . Diabetes mellitus    Diagnosed 2012  . Diverticulosis of colon (without mention of hemorrhage)   . GERD (gastroesophageal reflux disease)   . Hx of cardiovascular stress test    a. Nuclear (08/2013):  No ischemia, EF 80%, Normal  . Hx of echocardiogram    a. Echo (07/2013):  Mild LVH, vigorous LVF, EF 65-70%, Gr 1 DD, mild MR, mild to mod LAE  . Hyperlipidemia    intol of statins  . Hypertension   . NHL (non-Hodgkin's lymphoma) (  [place]) dx'd 2003   chemo/xrt comp 2003  . Personal history of colonic polyps   . Proctitis   . Urticaria     Past Surgical History:  Procedure Laterality Date  . cardiac cath-neg    . CORONARY ANGIOPLASTY WITH STENT PLACEMENT     x 2  . dexa-neg    . KNEE SURGERY  10/2003   left  . laser surgery for cataracts-left    . LEFT HEART CATHETERIZATION WITH CORONARY ANGIOGRAM N/A 03/22/2014   Procedure: LEFT HEART CATHETERIZATION WITH CORONARY ANGIOGRAM;   Surgeon: Burnell Blanks, MD;  Location: [facility] CATH LAB;  Service: Cardiovascular;  Laterality: N/A;  . stress cardiolite    . VAGINAL HYSTERECTOMY     partial, fibroids, one ovary left    Social History   Social History  . Marital status: Married    Spouse name: N/A  . Number of children: N/A  . Years of education: N/A   Occupational History  . Retired Retired   Social History Main Topics  . Smoking status: Never Smoker  . Smokeless tobacco: Never Used  . Alcohol use No  . Drug use: No  . Sexual activity: No   Other Topics Concern  . Not on file   Social History Narrative  . No narrative on file    Family History  Problem Relation Age of Onset  . Cancer Mother        jaw  . Hypertension Father   . Heart attack Father   . Heart attack Sister   . Colon cancer Neg Hx     Allergies  Allergen Reactions  . Shellfish Allergy Anaphylaxis and Rash  . Cephalexin     REACTION: trash and swelling REACTION: trash and swelling  . Ciprofloxacin     REACTION: u/k  . Clopidogrel Bisulfate     REACTION: swelling, rash  . Codeine Phosphate     REACTION: unspecified  . Ezetimibe-Simvastatin     REACTION: myalgias REACTION: myalgias  . Hydrocod Polst-Cpm Polst Er     REACTION: u/k  . Lidocaine Hives    ONLY TO ADHESIVE LIDOCAINE PATCHES. NO PROBLEM WITH INJECTABLE LIDOCAINE.  Marland Kitchen Nitrofurantoin     REACTION: [place] REACTION: [place]  . Pregabalin     REACTION: felt bad REACTION: felt bad  . Propoxyphene N-Acetaminophen     REACTION: [place]  . Rofecoxib     unk unk   . Sulfamethoxazole     REACTION: unspecified  . Sulfonamide Derivatives     REACTION: unspecified  . Celecoxib Rash    REACTION: [place] REACTION: [place]  . Codeine Rash    REACTION: [place] REACTION: [place]  . Hydrocod Polst-Cpm Polst Er Rash  . Other Rash  . Propoxyphene Rash  . Sulfa Antibiotics Rash    REACTION: unspecified REACTION: unspecified    Medication list reviewed and updated in full in [place].  GEN: No fevers, chills. Nontoxic. Primarily MSK c/o today. MSK: Detailed in the HPI GI: tolerating PO intake without difficulty Neuro: No numbness, parasthesias, or tingling associated. Otherwise the pertinent positives of the ROS are noted above.   Objective:   BP 110/70 (BP Location: Right Arm, Patient Position: Sitting, Cuff Size: Normal)   Pulse 72   Temp 97.8 F (36.6 C) (Oral)   Ht 5' 8"  (1.727 m) Comment: no shoes  Wt 171 lb (77.6 kg)   SpO2 96%   BMI 26.00 kg/m    GEN: WDWN, NAD, Non-toxic, Alert & Oriented  x 3 HEENT: Atraumatic, Normocephalic.  Ears and Nose: No external deformity. EXTR: No clubbing/cyanosis/edema NEURO: Normal gait.  PSYCH: Normally interactive. Conversant. Not depressed or anxious appearing.  Calm demeanor.   HIP EXAM: SIDE: L ROM: Abduction, Flexion, Internal and External range of motion: full Pain with terminal IROM and EROM: nt GTB: L SLR: NEG Knees: No effusion FABER: NT REVERSE FABER: NT, neg Piriformis: NT at direct palpation Str: flexion: 5/5 abduction: 5/5 adduction: 5/5 Strength testing non-tender     Radiology: No results found.  Assessment and Plan:   Trochanteric bursitis, left hip  Left hip pain - Plan: methylPREDNISolone acetate (DEPO-MEDROL) injection 80 mg  Recurrent trochanteric bursitis. Try to exercise, stay limber and remained strong.  Trochanteric Bursitis Injection, L Verbal consent obtained. Risks (including infection, potential atrophy), benefits, and alternatives reviewed. Greater trochanter sterilely prepped with Chloraprep. Ethyl Chloride used for anesthesia. 8 cc of Lidocaine 1% injected with 2 mL of Depo-Medrol 40 mg into trochanteric bursa at area of maximal tenderness at greater trochanter. Needle taken to bone to troch bursa, flows easily. Bursa massaged. No bleeding and no complications. Decreased pain after injection. Needle: 22 gauge spinal needle   Follow-up: No Follow-up on file.  Meds  ordered this encounter  Medications  . methylPREDNISolone acetate (DEPO-MEDROL) injection 80 mg   Signed,  Aydrien Froman T. Latica Hohmann, MD   Allergies as of 04/27/2017      Reactions   Shellfish Allergy Anaphylaxis, Rash   Cephalexin    REACTION: trash and swelling REACTION: trash and swelling   Ciprofloxacin    REACTION: u/k   Clopidogrel Bisulfate    REACTION: swelling, rash   Codeine Phosphate    REACTION: unspecified   Ezetimibe-simvastatin    REACTION: myalgias REACTION: myalgias   Hydrocod Polst-cpm Polst Er    REACTION: u/k   Lidocaine Hives   ONLY TO ADHESIVE LIDOCAINE PATCHES. NO PROBLEM WITH INJECTABLE LIDOCAINE.   Nitrofurantoin    REACTION: [place] REACTION: [place]   Pregabalin    REACTION: felt bad REACTION: felt bad   Propoxyphene N-acetaminophen    REACTION: [place]   Rofecoxib    unk unk   Sulfamethoxazole    REACTION: unspecified   Sulfonamide Derivatives    REACTION: unspecified   Celecoxib Rash   REACTION: [place] REACTION: [place]   Codeine Rash   REACTION: [place] REACTION: [place]   Hydrocod Polst-cpm Polst Er Rash   Other Rash   Propoxyphene Rash   Sulfa Antibiotics Rash   REACTION: unspecified REACTION: unspecified      Medication List       Accurate as of 04/27/17  1:55 PM. Always use your most recent med list.          ASPIRIN LOW DOSE 81 MG tablet Generic drug:  aspirin Take 81 mg by mouth daily.   COMBIGAN 0.2-0.5 % ophthalmic solution Generic drug:  brimonidine-timolol Place 1 drop into both eyes every 12 (twelve) hours.   ezetimibe 10 MG tablet Commonly known as:  ZETIA Take 1 tablet (10 mg total) by mouth daily.   glipiZIDE 5 MG tablet Commonly known as:  GLUCOTROL TAKE ONE TABLET BY MOUTH TWICE A DAY.   hydrOXYzine 25 MG capsule Commonly known as:  VISTARIL Take 1 capsule (25 mg total) by mouth 3 (three) times daily as needed for anxiety or itching.   isosorbide mononitrate 60 MG 24 hr tablet Commonly known as:  IMDUR Take 1 tablet (60 mg  total) by mouth daily.   lansoprazole 15 MG  capsule Commonly known as:  PREVACID TAKE ONE CAPSULE BY MOUTH EVERY DAY   latanoprost 0.005 % ophthalmic solution Commonly known as:  XALATAN Place 1 drop into both eyes at bedtime.   losartan 50 MG tablet Commonly known as:  COZAAR Take 1 tablet (50 mg total) by mouth daily.   metoprolol succinate 100 MG 24 hr tablet Commonly known as:  TOPROL-XL Take 1 tablet (100 mg total) by mouth every morning. Take with or immediately following a meal.   nitroGLYCERIN 0.4 MG SL tablet Commonly known as:  NITROSTAT Place 1 tablet (0.4 mg total) under the tongue every 5 (five) minutes as needed for chest pain.   omeprazole-sodium bicarbonate 40-1100 MG capsule Commonly known as:  ZEGERID Take 1 capsule by mouth daily before breakfast.   ONE TOUCH LANCETS Misc 1 each by Does not apply route 2 (two) times daily.   ONE TOUCH ULTRA TEST test strip Generic drug:  glucose blood TEST TWICE DAILY   ONETOUCH VERIO FLEX SYSTEM w/Device Kit 1 Device by Does not apply route once.   OVER THE COUNTER MEDICATION Take 1 tablet by mouth daily. Med name: VISION FORMULA   traZODone 50 MG tablet Commonly known as:  DESYREL Take 0.5-1 tablets (25-50 mg total) by mouth at bedtime as needed for sleep.   trimethoprim 100 MG tablet Commonly known as:  TRIMPEX Take 100 mg by mouth 2 (two) times daily as needed (UTI).   vitamin B-12 1000 MCG tablet Commonly known as:  CYANOCOBALAMIN Take 1,000 mcg by mouth daily.   Vitamin D-3 1000 units Caps Take 1 capsule by mouth daily.

## 2017-04-27 NOTE — Progress Notes (Signed)
PCP notes:   Health maintenance:  Tetanus - postponed/insurance Foot exam - per pt, podiatrist completed exam Mammogram - per pt, screening completed today Eye exam - per pt, exam in Feb 2018  Abnormal screenings:   Hearing -failed  Patient concerns:   Hip pain - left, chronic, shooting. Pain scale: 9/10. Pt has same day appt with Dr. [name].  Nurse concerns:  None  Next PCP appt:   07/26/17 @ 1030

## 2017-05-26 ENCOUNTER — Ambulatory Visit: Payer: Medicare Other | Admitting: Family Medicine

## 2017-06-16 ENCOUNTER — Other Ambulatory Visit: Payer: Self-pay | Admitting: Family Medicine

## 2017-06-24 ENCOUNTER — Encounter: Payer: Self-pay | Admitting: Family Medicine

## 2017-07-26 ENCOUNTER — Encounter: Payer: Self-pay | Admitting: Family Medicine

## 2017-07-26 ENCOUNTER — Ambulatory Visit (INDEPENDENT_AMBULATORY_CARE_PROVIDER_SITE_OTHER): Payer: Medicare Other | Admitting: Family Medicine

## 2017-07-26 VITALS — BP 108/68 | HR 60 | Ht 68.0 in | Wt 177.0 lb

## 2017-07-26 DIAGNOSIS — E782 Mixed hyperlipidemia: Secondary | ICD-10-CM

## 2017-07-26 DIAGNOSIS — E538 Deficiency of other specified B group vitamins: Secondary | ICD-10-CM | POA: Diagnosis not present

## 2017-07-26 DIAGNOSIS — E1129 Type 2 diabetes mellitus with other diabetic kidney complication: Secondary | ICD-10-CM | POA: Diagnosis not present

## 2017-07-26 DIAGNOSIS — E0821 Diabetes mellitus due to underlying condition with diabetic nephropathy: Secondary | ICD-10-CM

## 2017-07-26 LAB — VITAMIN B12

## 2017-07-26 LAB — HEMOGLOBIN A1C: Hgb A1c MFr Bld: 8 % — ABNORMAL HIGH (ref 4.6–6.5)

## 2017-07-26 NOTE — Assessment & Plan Note (Signed)
Continue current rxs. Check a1c today.

## 2017-07-26 NOTE — Patient Instructions (Signed)
Great to see you.  I will call you with lab results from today.

## 2017-07-26 NOTE — Assessment & Plan Note (Signed)
Labs today. No changes made to rxs.

## 2017-07-26 NOTE — Progress Notes (Signed)
Subjective:   Patient ID: [name] [name], female    DOB: 1931-12-28, 83 y.o.   MRN: [id number]  [name] [name] is a pleasant 83 y.o. year old female who presents to clinic today with Follow-up  on 07/26/2017  HPI: DM-  On Glipizide 5 mg daily. Checks FSBS twice daily, doing very well with her diabetic diet. FSBS fasting have deteriorated in recent weeks- ranging in 140s rather than low 100s. Denies any episodes of hypoglycemia.  Lab Results  Component Value Date   HGBA1C 7.2 (H) 01/26/2017    HLD- due for labs.  Takes Zetia.  Statin intolerant.  Lab Results  Component Value Date   CHOL 216 (H) 01/14/2016   HDL 52.30 01/14/2016   LDLCALC 133 (H) 01/14/2016   LDLDIRECT 93.0 08/06/2015   TRIG 152.0 (H) 01/14/2016   CHOLHDL 4 01/14/2016   Lab Results  Component Value Date   ALT 39 (H) 01/26/2017   AST 36 01/26/2017   ALKPHOS 55 01/26/2017   BILITOT 0.6 01/26/2017     Current Outpatient Prescriptions on File Prior to Visit  Medication Sig Dispense Refill  . aspirin (ASPIRIN LOW DOSE) 81 MG tablet Take 81 mg by mouth daily.      . Blood Glucose Monitoring Suppl ([name]) w/Device KIT 1 Device by Does not apply route once. 1 kit 0  . brimonidine-timolol (COMBIGAN) 0.2-0.5 % ophthalmic solution Place 1 drop into both eyes every 12 (twelve) hours.    . Cholecalciferol (VITAMIN D-3) 1000 UNITS CAPS Take 1 capsule by mouth daily.    Marland Kitchen ezetimibe (ZETIA) 10 MG tablet Take 1 tablet (10 mg total) by mouth daily. 90 tablet 3  . glipiZIDE (GLUCOTROL) 5 MG tablet TAKE ONE TABLET BY MOUTH TWICE A DAY. 180 tablet 1  . hydrOXYzine (VISTARIL) 25 MG capsule Take 1 capsule (25 mg total) by mouth 3 (three) times daily as needed for anxiety or itching. 90 capsule 0  . isosorbide mononitrate (IMDUR) 60 MG 24 hr tablet Take 1 tablet (60 mg total) by mouth daily. 90 tablet 3  . lansoprazole (PREVACID) 15 MG capsule TAKE ONE CAPSULE BY MOUTH EVERY DAY 90 capsule 2  . latanoprost  (XALATAN) 0.005 % ophthalmic solution Place 1 drop into both eyes at bedtime.     Marland Kitchen losartan (COZAAR) 50 MG tablet Take 1 tablet (50 mg total) by mouth daily. 90 tablet 3  . metoprolol succinate (TOPROL-XL) 100 MG 24 hr tablet Take 1 tablet (100 mg total) by mouth every morning. Take with or immediately following a meal. 90 tablet 3  . nitroGLYCERIN (NITROSTAT) 0.4 MG SL tablet Place 1 tablet (0.4 mg total) under the tongue every 5 (five) minutes as needed for chest pain. 30 tablet 3  . omeprazole-sodium bicarbonate (ZEGERID) 40-1100 MG capsule Take 1 capsule by mouth daily before breakfast. 90 capsule 3  . ONE TOUCH LANCETS MISC 1 each by Does not apply route 2 (two) times daily.    . ONE TOUCH ULTRA TEST test strip TEST TWICE DAILY 100 each 10  . OVER THE COUNTER MEDICATION Take 1 tablet by mouth daily. Med name: VISION FORMULA    . traZODone (DESYREL) 50 MG tablet Take 0.5-1 tablets (25-50 mg total) by mouth at bedtime as needed for sleep. 30 tablet 3  . trimethoprim (TRIMPEX) 100 MG tablet Take 100 mg by mouth 2 (two) times daily as needed (UTI).     . vitamin B-12 (CYANOCOBALAMIN) 1000 MCG tablet Take 1,000  mcg by mouth daily.    . [DISCONTINUED] loratadine (CLARITIN) 10 MG tablet Take 10 mg by mouth as needed.      Current Facility-Administered Medications on File Prior to Visit  Medication Dose Route Frequency Provider Last Rate Last Dose  . betamethasone acetate-betamethasone sodium phosphate (CELESTONE) injection 3 mg  3 mg Intramuscular Once Daylene Katayama M, DPM      . betamethasone acetate-betamethasone sodium phosphate (CELESTONE) injection 3 mg  3 mg Intramuscular Once Edrick Kins, DPM        Allergies  Allergen Reactions  . Shellfish Allergy Anaphylaxis and Rash  . Cephalexin     REACTION: trash and swelling REACTION: trash and swelling  . Ciprofloxacin     REACTION: u/k  . Clopidogrel Bisulfate     REACTION: swelling, rash  . Codeine Phosphate     REACTION: unspecified    . Ezetimibe-Simvastatin     REACTION: myalgias REACTION: myalgias  . Hydrocod Polst-Cpm Polst Er     REACTION: u/k  . Lidocaine Hives    ONLY TO ADHESIVE LIDOCAINE PATCHES. NO PROBLEM WITH INJECTABLE LIDOCAINE.  Marland Kitchen Nitrofurantoin     REACTION: [place] REACTION: [place]  . Pregabalin     REACTION: felt bad REACTION: felt bad  . Propoxyphene N-Acetaminophen     REACTION: [place]  . Rofecoxib     unk unk   . Sulfamethoxazole     REACTION: unspecified  . Sulfonamide Derivatives     REACTION: unspecified  . Celecoxib Rash    REACTION: [place] REACTION: [place]  . Codeine Rash    REACTION: [place] REACTION: [place]  . Hydrocod Polst-Cpm Polst Er Rash  . Other Rash  . Propoxyphene Rash  . Sulfa Antibiotics Rash    REACTION: unspecified REACTION: unspecified    Past Medical History:  Diagnosis Date  . Allergy    Cipro, Plavix, Statins  . CAD (coronary artery disease)    a. s/p tandem Promus DES to LAD in 2009 by Dr. Olevia Perches;  b.  LHC (03/22/14):  prox LAD 30%, mid LAD 40%, LAD stent ok with dist 20% ISR, apical LAD occluded with L-L collats filling apical vessel (too small for PCI), mid RCA 20%, EF 70%.  Med Rx  . Diabetes mellitus    Diagnosed 2012  . Diverticulosis of colon (without mention of hemorrhage)   . GERD (gastroesophageal reflux disease)   . Hx of cardiovascular stress test    a. Nuclear (08/2013):  No ischemia, EF 80%, Normal  . Hx of echocardiogram    a. Echo (07/2013):  Mild LVH, vigorous LVF, EF 65-70%, Gr 1 DD, mild MR, mild to mod LAE  . Hyperlipidemia    intol of statins  . Hypertension   . NHL (non-Hodgkin's lymphoma) ([place]) dx'd 2003   chemo/xrt comp 2003  . Personal history of colonic polyps   . Proctitis   . Urticaria     Past Surgical History:  Procedure Laterality Date  . cardiac cath-neg    . CORONARY ANGIOPLASTY WITH STENT PLACEMENT     x 2  . dexa-neg    . KNEE SURGERY  10/2003   left  . laser surgery for cataracts-left    . LEFT HEART CATHETERIZATION WITH CORONARY  ANGIOGRAM N/A 03/22/2014   Procedure: LEFT HEART CATHETERIZATION WITH CORONARY ANGIOGRAM;  Surgeon: Burnell Blanks, MD;  Location: [facility] CATH LAB;  Service: Cardiovascular;  Laterality: N/A;  . stress cardiolite    . VAGINAL HYSTERECTOMY     partial, fibroids,  one ovary left    Family History  Problem Relation Age of Onset  . Cancer Mother        jaw  . Hypertension Father   . Heart attack Father   . Heart attack Sister   . Colon cancer Neg Hx     Social History   Social History  . Marital status: Married    Spouse name: N/A  . Number of children: N/A  . Years of education: N/A   Occupational History  . Retired Retired   Social History Main Topics  . Smoking status: Never Smoker  . Smokeless tobacco: Never Used  . Alcohol use No  . Drug use: No  . Sexual activity: No   Other Topics Concern  . Not on file   Social History Narrative  . No narrative on file   The PMH, PSH, Social History, Family History, Medications, and allergies have been reviewed in [facility], and have been updated if relevant.   Review of Systems  Constitutional: Negative.   HENT: Negative.   Eyes: Negative.   Respiratory: Negative.   Cardiovascular: Negative.   Gastrointestinal: Negative.   Endocrine: Negative.   Genitourinary: Negative.   Musculoskeletal: Negative.   Allergic/Immunologic: Negative.   Neurological: Negative.   Hematological: Negative.   Psychiatric/Behavioral: Negative.   All other systems reviewed and are negative.      Objective:    BP 108/68   Pulse 60   Ht 5' 8"  (1.727 m)   Wt 177 lb (80.3 kg)   SpO2 96%   BMI 26.91 kg/m    Physical Exam   General:  Well-developed,well-nourished,in no acute distress; alert,appropriate and cooperative throughout examination Head:  normocephalic and atraumatic.   Eyes:  vision grossly intact, PERRL Ears:  R ear normal and L ear normal externally, TMs clear bilaterally Nose:  no external deformity.   Mouth:  good  dentition.   Lungs:  Normal respiratory effort, chest expands symmetrically. Lungs are clear to auscultation, no crackles or wheezes. Heart:  Normal rate and regular rhythm. S1 and S2 normal without gallop, murmur, click, rub or other extra sounds. Abdomen:  Bowel sounds positive,abdomen soft and non-tender without masses, organomegaly or hernias noted. Msk:  No deformity or scoliosis noted of thoracic or lumbar spine.   Extremities:  No clubbing, cyanosis, edema, or deformity noted with normal full range of motion of all joints.   Neurologic:  alert & oriented X3 and gait normal.   Skin:  Intact without suspicious lesions or rashes Psych:  Cognition and judgment appear intact. Alert and cooperative with normal attention span and concentration. No apparent delusions, illusions, hallucinations       Assessment & Plan:   Diabetes mellitus due to underlying condition, controlled, with diabetic nephropathy, without long-term current use of insulin (HCC) - Plan: Comprehensive metabolic panel, TSH, Hemoglobin A1c  B12 deficiency - Plan: Vitamin B12  Mixed hyperlipidemia - Plan: Lipid panel No Follow-up on file.

## 2017-07-27 ENCOUNTER — Telehealth: Payer: Self-pay | Admitting: Family Medicine

## 2017-07-27 NOTE — Telephone Encounter (Signed)
Patient returned Direce's call.  Patient is going to a funeral this afternoon.  Please call her back before noon.

## 2017-07-27 NOTE — Telephone Encounter (Signed)
Refer to result notes.

## 2017-08-08 ENCOUNTER — Other Ambulatory Visit: Payer: Self-pay | Admitting: Family Medicine

## 2017-08-31 ENCOUNTER — Telehealth: Payer: Self-pay

## 2017-08-31 NOTE — Telephone Encounter (Signed)
Pt last seen 07/26/17; pt on eye gtt for glaucoma that might cause elevated BS. Pt said FBS 140's - 150s. 08/31/17 FBS 161.  Pt feels OK; some days pt feels weak but does not feel bad today. Pt taking glipizide 5 mg one bid. Pt wants to know if needs to repeat A1C or shat to do. Pt request cb. [place].

## 2017-08-31 NOTE — Telephone Encounter (Signed)
Spoke with patient advised her it was too early for A1C .  She states she is working hard on cutting  Sugar from diet.

## 2017-08-31 NOTE — Telephone Encounter (Signed)
It's a bit too early to repeat an a1c.  Has she cut out sugar from her diet?

## 2017-09-05 ENCOUNTER — Encounter: Payer: Self-pay | Admitting: Cardiovascular Disease

## 2017-09-08 ENCOUNTER — Ambulatory Visit (INDEPENDENT_AMBULATORY_CARE_PROVIDER_SITE_OTHER): Payer: Medicare Other

## 2017-09-08 DIAGNOSIS — Z23 Encounter for immunization: Secondary | ICD-10-CM

## 2017-09-15 ENCOUNTER — Ambulatory Visit (INDEPENDENT_AMBULATORY_CARE_PROVIDER_SITE_OTHER): Payer: Medicare Other | Admitting: Cardiovascular Disease

## 2017-09-15 ENCOUNTER — Encounter: Payer: Self-pay | Admitting: Cardiovascular Disease

## 2017-09-15 VITALS — BP 116/70 | HR 65 | Ht 68.0 in | Wt 175.1 lb

## 2017-09-15 DIAGNOSIS — I2510 Atherosclerotic heart disease of native coronary artery without angina pectoris: Secondary | ICD-10-CM | POA: Diagnosis not present

## 2017-09-15 DIAGNOSIS — I10 Essential (primary) hypertension: Secondary | ICD-10-CM | POA: Diagnosis not present

## 2017-09-15 DIAGNOSIS — E7800 Pure hypercholesterolemia, unspecified: Secondary | ICD-10-CM | POA: Diagnosis not present

## 2017-09-15 MED ORDER — NITROGLYCERIN 0.4 MG SL SUBL: 0.4000 mg | SUBLINGUAL_TABLET | SUBLINGUAL | 6 refills | Status: DC | PRN

## 2017-09-15 NOTE — Progress Notes (Signed)
4  Chief Complaint  Patient presents with  . Follow-up    CAD      History of Present Illness: 83 yo female with history of CAD, HLD, HTN, non-Hodgkin's lymphoma, DM here today for cardiac follow up. In 2009 she had tandem non-overlapping drug-eluting stents placed in the LAD. Last cardiac cath April 2015 with patent LAD stents, occluded apical LAD segment with left to left collaterals and non-obstructive disease elsewhere. She has been intolerant to statins and Plavix in the past. She has been on Zetia. Echo 01/20/16 with normal LV size and function, trivial aortic valve insufficiency. She has had bilateral foot pain and normal ABI in 2016. Last nuclear stress test October 2017 with no ischemia. She has stable angina controlled with Imdur.    She is here today for follow up. The patient denies any dyspnea, palpitations, lower extremity edema, orthopnea, PND, dizziness, near syncope or syncope. She had one episode of chest pain this am while asleep.This awakened her from her sleep and lasted for ten minutes. The pain resolved with NTG SL x 1. No recurrence of chest pain today.   Primary Care Physician: Lucille Passy, MD   Past Medical History:  Diagnosis Date  . Allergy    Cipro, Plavix, Statins  . CAD (coronary artery disease)    a. s/p tandem Promus DES to LAD in 2009 by Dr. Olevia Perches;  b.  LHC (03/22/14):  prox LAD 30%, mid LAD 40%, LAD stent ok with dist 20% ISR, apical LAD occluded with L-L collats filling apical vessel (too small for PCI), mid RCA 20%, EF 70%.  Med Rx  . Diabetes mellitus    Diagnosed 2012  . Diverticulosis of colon (without mention of hemorrhage)   . GERD (gastroesophageal reflux disease)   . Hx of cardiovascular stress test    a. Nuclear (08/2013):  No ischemia, EF 80%, Normal  . Hx of echocardiogram    a. Echo (07/2013):  Mild LVH, vigorous LVF, EF 65-70%, Gr 1 DD, mild MR, mild to mod LAE  . Hyperlipidemia    intol of statins  . Hypertension   . NHL  (non-Hodgkin's lymphoma) ([place]) dx'd 2003   chemo/xrt comp 2003  . Personal history of colonic polyps   . Proctitis   . Urticaria     Past Surgical History:  Procedure Laterality Date  . cardiac cath-neg    . CORONARY ANGIOPLASTY WITH STENT PLACEMENT     x 2  . dexa-neg    . KNEE SURGERY  10/2003   left  . laser surgery for cataracts-left    . LEFT HEART CATHETERIZATION WITH CORONARY ANGIOGRAM N/A 03/22/2014   Procedure: LEFT HEART CATHETERIZATION WITH CORONARY ANGIOGRAM;  Surgeon: Burnell Blanks, MD;  Location: [facility] CATH LAB;  Service: Cardiovascular;  Laterality: N/A;  . stress cardiolite    . VAGINAL HYSTERECTOMY     partial, fibroids, one ovary left    Current Outpatient Prescriptions  Medication Sig Dispense Refill  . aspirin (ASPIRIN LOW DOSE) 81 MG tablet Take 81 mg by mouth daily.      . Blood Glucose Monitoring Suppl ([place]) w/Device KIT 1 Device by Does not apply route once. 1 kit 0  . brimonidine-timolol (COMBIGAN) 0.2-0.5 % ophthalmic solution Place 1 drop into both eyes every 12 (twelve) hours.    . Cholecalciferol (VITAMIN D-3) 1000 UNITS CAPS Take 1 capsule by mouth daily.    Marland Kitchen ezetimibe (ZETIA) 10 MG tablet Take 1 tablet (10  mg total) by mouth daily. 90 tablet 3  . glipiZIDE (GLUCOTROL) 5 MG tablet TAKE 1 TABLET BY MOUTH TWICE A DAY. 180 tablet 0  . isosorbide mononitrate (IMDUR) 60 MG 24 hr tablet Take 1 tablet (60 mg total) by mouth daily. 90 tablet 3  . lansoprazole (PREVACID) 15 MG capsule TAKE ONE CAPSULE BY MOUTH EVERY DAY 90 capsule 2  . Latanoprostene Bunod (VYZULTA) 0.024 % SOLN Apply 1 drop to eye at bedtime.    Marland Kitchen losartan (COZAAR) 50 MG tablet Take 1 tablet (50 mg total) by mouth daily. 90 tablet 3  . metoprolol succinate (TOPROL-XL) 100 MG 24 hr tablet Take 1 tablet (100 mg total) by mouth every morning. Take with or immediately following a meal. 90 tablet 3  . nitroGLYCERIN (NITROSTAT) 0.4 MG SL tablet Place 1 tablet (0.4 mg  total) under the tongue every 5 (five) minutes as needed for chest pain. 26 tablet 6  . omeprazole-sodium bicarbonate (ZEGERID) 40-1100 MG capsule Take 1 capsule by mouth daily before breakfast. 90 capsule 3  . ONE TOUCH LANCETS MISC 1 each by Does not apply route 2 (two) times daily.    . ONE TOUCH ULTRA TEST test strip TEST TWICE DAILY 100 each 10  . OVER THE COUNTER MEDICATION Take 1 tablet by mouth daily. Med name: VISION FORMULA    . traZODone (DESYREL) 50 MG tablet Take 0.5-1 tablets (25-50 mg total) by mouth at bedtime as needed for sleep. 30 tablet 3  . trimethoprim (TRIMPEX) 100 MG tablet Take 100 mg by mouth 2 (two) times daily as needed (UTI).     . vitamin B-12 (CYANOCOBALAMIN) 1000 MCG tablet Take 1,000 mcg by mouth daily.     Current Facility-Administered Medications  Medication Dose Route Frequency Provider Last Rate Last Dose  . betamethasone acetate-betamethasone sodium phosphate (CELESTONE) injection 3 mg  3 mg Intramuscular Once Daylene Katayama M, DPM      . betamethasone acetate-betamethasone sodium phosphate (CELESTONE) injection 3 mg  3 mg Intramuscular Once Edrick Kins, DPM        Allergies  Allergen Reactions  . Other Rash and Anaphylaxis  . Shellfish Allergy Anaphylaxis and Rash  . Cephalexin     REACTION: trash and swelling REACTION: trash and swelling  . Ciprofloxacin     REACTION: u/k  . Clopidogrel Bisulfate     REACTION: swelling, rash  . Codeine Phosphate     REACTION: unspecified  . Ezetimibe-Simvastatin     REACTION: myalgias REACTION: myalgias  . Hydrocod Polst-Cpm Polst Er     REACTION: u/k  . Lidocaine Hives    ONLY TO ADHESIVE LIDOCAINE PATCHES. NO PROBLEM WITH INJECTABLE LIDOCAINE.  Marland Kitchen Nitrofurantoin     REACTION: [place] REACTION: [place]  . Pregabalin     REACTION: felt bad REACTION: felt bad  . Propoxyphene N-Acetaminophen     REACTION: [place]  . Rofecoxib     unk unk   . Sulfamethoxazole     REACTION: unspecified  . Sulfonamide Derivatives       REACTION: unspecified  . Celecoxib Rash    REACTION: [place] REACTION: [place]  . Codeine Rash    REACTION: [place] REACTION: [place]  . Hydrocod Polst-Cpm Polst Er Rash  . Propoxyphene Rash  . Sulfa Antibiotics Rash    REACTION: unspecified REACTION: unspecified    Social History   Social History  . Marital status: Married    Spouse name: N/A  . Number of children: N/A  . Years of education:  N/A   Occupational History  . Retired Retired   Social History Main Topics  . Smoking status: Never Smoker  . Smokeless tobacco: Never Used  . Alcohol use No  . Drug use: No  . Sexual activity: No   Other Topics Concern  . Not on file   Social History Narrative  . No narrative on file    Family History  Problem Relation Age of Onset  . Cancer Mother        jaw  . Hypertension Father   . Heart attack Father   . Heart attack Sister   . Colon cancer Neg Hx     Review of Systems:  As stated in the HPI and otherwise negative.   BP 116/70   Pulse 65   Ht 5' 8" (1.727 m)   Wt 175 lb 1.9 oz (79.4 kg)   SpO2 95%   BMI 26.63 kg/m   Physical Examination:  General: Well developed, well nourished, NAD  HEENT: OP clear, mucus membranes moist  SKIN: warm, dry. No rashes. Neuro: No focal deficits  Musculoskeletal: Muscle strength 5/5 all ext  Psychiatric: Mood and affect normal  Neck: No JVD, no carotid bruits, no thyromegaly, no lymphadenopathy.  Lungs:Clear bilaterally, no wheezes, rhonci, crackles Cardiovascular: Regular rate and rhythm. No murmurs, gallops or rubs. Abdomen:Soft. Bowel sounds present. Non-tender.  Extremities: No lower extremity edema. Pulses are 2 + in the bilateral DP/PT.  Echo 01/20/16: Left ventricle: The cavity size was normal. Wall thickness was   increased in a pattern of mild LVH. There was mild focal basal   hypertrophy of the septum. Systolic function was normal. The   estimated ejection fraction was in the range of 60% to 65%. Wall   motion was normal;  there were no regional wall motion   abnormalities. Doppler parameters are consistent with abnormal   left ventricular relaxation (grade 1 diastolic dysfunction).   Doppler parameters are consistent with high ventricular filling   pressure. - Aortic valve: There was trivial regurgitation. - Mitral valve: Calcified annulus.  Cardiac cath 03/22/14: Left main: No obstructive disease.  Left Anterior Descending Artery: Large caliber vessel that courses to the apex. 30% proximal stenosis. 40% mid stenosis just prior to the stented segment. Mid stented segment patent without restenosis. Distal stented segment patent with 20% restenosis. Apical LAD becomes very small in caliber and has total occlusion with filling of apical vessel by left to left collaterals. (0.75 mm vessel).  Circumflex Artery: Large caliber vessel with several small OM branches. Diffuse luminal irregularities in the mid vessel.  Right Coronary Artery: Large dominant vessel with 20% mid stenosis.  Left Ventricular Angiogram: LVEF=70%.   EKG:  EKG is ordered today. The ekg ordered today demonstrates NSR, rate 64 bpm.   Recent Labs: 01/26/2017: ALT 39; BUN 13; Creatinine, Ser 1.06; Potassium 4.7; Sodium 137   Lipid Panel    Component Value Date/Time   CHOL 216 (H) 01/14/2016 0934   TRIG 152.0 (H) 01/14/2016 0934   HDL 52.30 01/14/2016 0934   CHOLHDL 4 01/14/2016 0934   VLDL 30.4 01/14/2016 0934   LDLCALC 133 (H) 01/14/2016 0934   LDLDIRECT 93.0 08/06/2015 0847     Wt Readings from Last 3 Encounters:  09/15/17 175 lb 1.9 oz (79.4 kg)  07/26/17 177 lb (80.3 kg)  04/27/17 171 lb (77.6 kg)     Other studies Reviewed: Additional studies/ records that were reviewed today include: . Review of the above records demonstrates:  Assessment and Plan:   1. CAD with stable angina: She has chronic stable angina. One episode of chest pain this am. EKG normal today. No other pain over last year. Will continue ASA, Toprol,  Zetia and Imdur. She is intolerant of statins.  She will call back if she has increase in frequency or severity of chest pain. If she has more chest pain, will need to consider repeat cath.   2. Hyperlipidemia: She is intolerant of statins. Continue Zetia.   3. HTN: BP controlled. No changes.    Current medicines are reviewed at length with the patient today.  The patient does not have concerns regarding medicines.  The following changes have been made:  no change  Labs/ tests ordered today include:   Orders Placed This Encounter  Procedures  . EKG 12-Lead    Disposition:   FU with me in 12 months  Signed, Lauree Chandler, MD 09/15/2017 4:20 PM    [facility] [address], [place], [place]  [address] Phone: [phone]; Fax: [phone]

## 2017-09-15 NOTE — Patient Instructions (Signed)

## 2017-09-27 ENCOUNTER — Ambulatory Visit (INDEPENDENT_AMBULATORY_CARE_PROVIDER_SITE_OTHER): Payer: Medicare Other | Admitting: Family Medicine

## 2017-09-27 ENCOUNTER — Encounter: Payer: Self-pay | Admitting: Family Medicine

## 2017-09-27 DIAGNOSIS — M542 Cervicalgia: Secondary | ICD-10-CM | POA: Diagnosis not present

## 2017-09-27 DIAGNOSIS — G4700 Insomnia, unspecified: Secondary | ICD-10-CM | POA: Diagnosis not present

## 2017-09-27 MED ORDER — CYCLOBENZAPRINE HCL 5 MG PO TABS: 5.0000 mg | ORAL_TABLET | Freq: Every day | ORAL | 1 refills | Status: DC

## 2017-09-27 NOTE — Progress Notes (Signed)
Subjective:   Patient ID: [name] [name], female    DOB: 1932/03/19, 83 y.o.   MRN: [id number]  [name] [name] is a pleasant 83 y.o. year old female who presents to clinic today with Neck Pain (Patient is here today C/O neck pain that radiates up into AU.  This started a little over a week ago.  Has Tx with heat, ointment, and other OTC meds but still very painful.  Does not have full ROM.  She also states that the Trazodone is ineffective at helping her sleep.)  on 09/27/2017  HPI:  Neck pain- radiates up to her ear. Started a little over a week ago. Has tried heat, ointment, other OTC meds.  Decreased ROM.  No known injury.  Insomnia- Feels trazodone is no longer effective. She is currently taking 25 mg nightly.  Under a lot of stress- husband's dementia is worsening.  Current Outpatient Prescriptions on File Prior to Visit  Medication Sig Dispense Refill  . aspirin (ASPIRIN LOW DOSE) 81 MG tablet Take 81 mg by mouth daily.      . Blood Glucose Monitoring Suppl ([place]) w/Device KIT 1 Device by Does not apply route once. 1 kit 0  . brimonidine-timolol (COMBIGAN) 0.2-0.5 % ophthalmic solution Place 1 drop into both eyes every 12 (twelve) hours.    . Cholecalciferol (VITAMIN D-3) 1000 UNITS CAPS Take 1 capsule by mouth daily.    Marland Kitchen ezetimibe (ZETIA) 10 MG tablet Take 1 tablet (10 mg total) by mouth daily. 90 tablet 3  . glipiZIDE (GLUCOTROL) 5 MG tablet TAKE 1 TABLET BY MOUTH TWICE A DAY. 180 tablet 0  . isosorbide mononitrate (IMDUR) 60 MG 24 hr tablet Take 1 tablet (60 mg total) by mouth daily. 90 tablet 3  . Latanoprostene Bunod (VYZULTA) 0.024 % SOLN Apply 1 drop to eye at bedtime.    Marland Kitchen losartan (COZAAR) 50 MG tablet Take 1 tablet (50 mg total) by mouth daily. 90 tablet 3  . metoprolol succinate (TOPROL-XL) 100 MG 24 hr tablet Take 1 tablet (100 mg total) by mouth every morning. Take with or immediately following a meal. 90 tablet 3  . nitroGLYCERIN (NITROSTAT)  0.4 MG SL tablet Place 1 tablet (0.4 mg total) under the tongue every 5 (five) minutes as needed for chest pain. 26 tablet 6  . omeprazole-sodium bicarbonate (ZEGERID) 40-1100 MG capsule Take 1 capsule by mouth daily before breakfast. 90 capsule 3  . ONE TOUCH LANCETS MISC 1 each by Does not apply route 2 (two) times daily.    . ONE TOUCH ULTRA TEST test strip TEST TWICE DAILY 100 each 10  . OVER THE COUNTER MEDICATION Take 1 tablet by mouth daily. Med name: VISION FORMULA    . traZODone (DESYREL) 50 MG tablet Take 0.5-1 tablets (25-50 mg total) by mouth at bedtime as needed for sleep. 30 tablet 3  . trimethoprim (TRIMPEX) 100 MG tablet Take 100 mg by mouth 2 (two) times daily as needed (UTI).     . vitamin B-12 (CYANOCOBALAMIN) 1000 MCG tablet Take 1,000 mcg by mouth daily.    . [DISCONTINUED] loratadine (CLARITIN) 10 MG tablet Take 10 mg by mouth as needed.      No current facility-administered medications on file prior to visit.     Allergies  Allergen Reactions  . Other Rash and Anaphylaxis  . Shellfish Allergy Anaphylaxis and Rash  . Cephalexin     REACTION: trash and swelling REACTION: trash and swelling  .  Ciprofloxacin     REACTION: u/k  . Clopidogrel Bisulfate     REACTION: swelling, rash  . Codeine Phosphate     REACTION: unspecified  . Ezetimibe-Simvastatin     REACTION: myalgias REACTION: myalgias  . Hydrocod Polst-Cpm Polst Er     REACTION: u/k  . Lidocaine Hives    ONLY TO ADHESIVE LIDOCAINE PATCHES. NO PROBLEM WITH INJECTABLE LIDOCAINE.  Marland Kitchen Nitrofurantoin     REACTION: [place] REACTION: [place]  . Pregabalin     REACTION: felt bad REACTION: felt bad  . Propoxyphene N-Acetaminophen     REACTION: [place]  . Rofecoxib     unk unk   . Sulfamethoxazole     REACTION: unspecified  . Sulfonamide Derivatives     REACTION: unspecified  . Celecoxib Rash    REACTION: [place] REACTION: [place]  . Codeine Rash    REACTION: [place] REACTION: [place]  . Hydrocod Polst-Cpm Polst Er Rash  .  Propoxyphene Rash  . Sulfa Antibiotics Rash    REACTION: unspecified REACTION: unspecified    Past Medical History:  Diagnosis Date  . Allergy    Cipro, Plavix, Statins  . CAD (coronary artery disease)    a. s/p tandem Promus DES to LAD in 2009 by Dr. Olevia Perches;  b.  LHC (03/22/14):  prox LAD 30%, mid LAD 40%, LAD stent ok with dist 20% ISR, apical LAD occluded with L-L collats filling apical vessel (too small for PCI), mid RCA 20%, EF 70%.  Med Rx  . Diabetes mellitus    Diagnosed 2012  . Diverticulosis of colon (without mention of hemorrhage)   . GERD (gastroesophageal reflux disease)   . Hx of cardiovascular stress test    a. Nuclear (08/2013):  No ischemia, EF 80%, Normal  . Hx of echocardiogram    a. Echo (07/2013):  Mild LVH, vigorous LVF, EF 65-70%, Gr 1 DD, mild MR, mild to mod LAE  . Hyperlipidemia    intol of statins  . Hypertension   . NHL (non-Hodgkin's lymphoma) ([place]) dx'd 2003   chemo/xrt comp 2003  . Personal history of colonic polyps   . Proctitis   . Urticaria     Past Surgical History:  Procedure Laterality Date  . cardiac cath-neg    . CORONARY ANGIOPLASTY WITH STENT PLACEMENT     x 2  . dexa-neg    . KNEE SURGERY  10/2003   left  . laser surgery for cataracts-left    . LEFT HEART CATHETERIZATION WITH CORONARY ANGIOGRAM N/A 03/22/2014   Procedure: LEFT HEART CATHETERIZATION WITH CORONARY ANGIOGRAM;  Surgeon: Burnell Blanks, MD;  Location: [facility] CATH LAB;  Service: Cardiovascular;  Laterality: N/A;  . stress cardiolite    . VAGINAL HYSTERECTOMY     partial, fibroids, one ovary left    Family History  Problem Relation Age of Onset  . Cancer Mother        jaw  . Hypertension Father   . Heart attack Father   . Heart attack Sister   . Colon cancer Neg Hx     Social History   Social History  . Marital status: Married    Spouse name: N/A  . Number of children: N/A  . Years of education: N/A   Occupational History  . Retired Retired    Social History Main Topics  . Smoking status: Never Smoker  . Smokeless tobacco: Never Used  . Alcohol use No  . Drug use: No  . Sexual activity: No   Other  Topics Concern  . Not on file   Social History Narrative  . No narrative on file   The PMH, PSH, Social History, Family History, Medications, and allergies have been reviewed in [facility], and have been updated if relevant.   Review of Systems  Constitutional: Negative.   Musculoskeletal: Positive for neck pain and neck stiffness.  Psychiatric/Behavioral: Positive for sleep disturbance. Negative for agitation, behavioral problems, confusion, decreased concentration, dysphoric mood and hallucinations. The patient is not nervous/anxious and is not hyperactive.   All other systems reviewed and are negative.      Objective:    BP (!) 116/58 (BP Location: Left Arm, Patient Position: Sitting, Cuff Size: Normal)   Pulse 65   Temp 98.1 F (36.7 C) (Oral)   Ht 5' 8"  (1.727 m)   Wt 165 lb (74.8 kg)   SpO2 96%   BMI 25.09 kg/m    Physical Exam  Constitutional: She is oriented to person, place, and time. She appears well-developed and well-nourished. No distress.  HENT:  Head: Normocephalic and atraumatic.  Eyes: Conjunctivae are normal.  Cardiovascular: Normal rate.   Pulmonary/Chest: Effort normal.  Musculoskeletal:       Cervical back: She exhibits decreased range of motion, tenderness, pain and spasm. She exhibits no bony tenderness, no swelling, no edema, no deformity and no laceration.  Neurological: She is alert and oriented to person, place, and time. No cranial nerve deficit.  Skin: Skin is warm and dry. She is not diaphoretic.  Psychiatric: She has a normal mood and affect. Her behavior is normal. Judgment and thought content normal.  Nursing note and vitals reviewed.         Assessment & Plan:   Neck pain  Insomnia, unspecified type No Follow-up on file.

## 2017-09-27 NOTE — Assessment & Plan Note (Addendum)
Cervical strain. PLAN: rest the injured area as much as practical, apply heat, get a new pillow. Given how symptomatic she is, will try short course of low dose flexeril at bedtime.  Consider Physical Therapy and XRay studies if not improving.  Call or return to clinic prn if these symptoms worsen or fail to improve as anticipated.

## 2017-09-27 NOTE — Assessment & Plan Note (Signed)
Deteriorated due to recent worsening stressors. Advised trial of increasing trazodone to 50 mg nightly. Call or return to clinic prn if these symptoms worsen or fail to improve as anticipated. The patient indicates understanding of these issues and agrees with the plan.

## 2017-09-27 NOTE — Patient Instructions (Addendum)
Great to see you.  Try a new pillow.  Flexeril 5 mg nightly for next few nights. Continue heat.  Please keep me updated.  Try taking a full tablet (50 mg) of trazodone at bedtime.

## 2017-10-05 ENCOUNTER — Telehealth: Payer: Self-pay | Admitting: Family Medicine

## 2017-10-05 NOTE — Telephone Encounter (Signed)
Can she come in to see Dr. [name]?

## 2017-10-05 NOTE — Telephone Encounter (Signed)
LMOVM for pt to call office/can see Dr. [name]? Thx dmf

## 2017-10-05 NOTE — Telephone Encounter (Signed)
Copied from [place]. Topic: Quick Communication - See Telephone Encounter >> Oct 05, 2017  8:51 AM Ether Griffins B wrote: CRM for notification. See Telephone encounter for:  Muscle relaxer called in isnt helping. Using heat on it as well. Pain is on right side of neck and across shoulders.  10/05/17.

## 2017-10-10 ENCOUNTER — Encounter: Payer: Self-pay | Admitting: Family Medicine

## 2017-10-10 ENCOUNTER — Ambulatory Visit: Payer: Medicare Other | Admitting: Family Medicine

## 2017-10-10 ENCOUNTER — Other Ambulatory Visit: Payer: Self-pay

## 2017-10-10 VITALS — BP 140/70 | HR 75 | Temp 98.3°F | Ht 68.0 in | Wt 176.0 lb

## 2017-10-10 DIAGNOSIS — M542 Cervicalgia: Secondary | ICD-10-CM | POA: Diagnosis not present

## 2017-10-10 MED ORDER — PREDNISONE 20 MG PO TABS: ORAL_TABLET | ORAL | 0 refills | Status: DC

## 2017-10-10 NOTE — Progress Notes (Signed)
Dr. Frederico Hamman T. Demaree Liberto, MD, [facility] [place], [address] Phone: [phone] Fax: [phone]  10/10/2017  Patient: [name] [name], MRN: [id number], DOB: 09/04/1932, 83 y.o.  Primary Physician:  Lucille Passy, MD   Chief Complaint  Patient presents with  . Neck Pain    Radiates to right shoulder-Seen Dr. Loraine Grip Flexeril-Not helping   Subjective:   [name] [name] is a 83 y.o. very pleasant female patient who presents with the following:  Saw Dr. Deborra Medina a couple of weeks ago, taking some flexeril at night. Does not seem to be helping it. Now on a bambos pillow. Using some tube exercises.   She is having some pain and restriction of motion in her neck over the last few weeks.  She does have notable restriction of motion throughout her neck currently.  She was having some occasional intermittent radiation to the right shoulder only, no radiation down the arms.  No weakness.  No numbness.  Past Medical History, Surgical History, Social History, Family History, Problem List, Medications, and Allergies have been reviewed and updated if relevant.  Patient Active Problem List   Diagnosis Date Noted  . Neck pain 09/27/2017  . Insomnia 09/27/2017  . Situational insomnia 01/26/2017  . Stress due to illness of family member 05/04/2016  . Pain in joints of both feet 01/14/2016  . Osteopenia 08/06/2015  . Constipation 12/30/2014  . B12 deficiency 12/30/2014  . Allergic reaction 09/02/2014  . Arthralgia of right foot 07/18/2014  . Pruritic intertrigo 07/18/2014  . Right hip pain 06/04/2014  . Other malaise and fatigue 06/04/2014  . Elevated LFTs 04/19/2014  . Mitral regurgitation 04/19/2014  . Pes planus 06/13/2013  . Renal insufficiency 06/07/2011  . Diabetes mellitus with renal manifestations, controlled ([place]) 05/31/2011  . MIXED HYPERLIPIDEMIA 08/04/2010  . BEN HTN HEART DISEASE WITHOUT HEART FAIL 08/04/2010  . LUMBAR  SPRAIN AND STRAIN 01/15/2010  . OSTEOPENIA 04/29/2009  . 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 02/25/2009  . EXTERNAL HEMORRHOIDS 02/24/2009  . DIVERTICULOSIS, COLON 02/24/2009  . LYMPHOMA, MALT 08/28/2007  . DETACHED RETINA 08/28/2007  . LABILE HYPERTENSION 08/28/2007  . Coronary atherosclerosis 08/28/2007  . MITRAL VALVE PROLAPSE 08/28/2007  . GERD 08/28/2007  . IBS 08/28/2007  . BREAST CYSTS, BILATERAL 08/28/2007    Past Medical History:  Diagnosis Date  . Allergy    Cipro, Plavix, Statins  . CAD (coronary artery disease)    a. s/p tandem Promus DES to LAD in 2009 by Dr. Olevia Perches;  b.  LHC (03/22/14):  prox LAD 30%, mid LAD 40%, LAD stent ok with dist 20% ISR, apical LAD occluded with L-L collats filling apical vessel (too small for PCI), mid RCA 20%, EF 70%.  Med Rx  . Diabetes mellitus    Diagnosed 2012  . Diverticulosis of colon (without mention of hemorrhage)   . GERD (gastroesophageal reflux disease)   . Hx of cardiovascular stress test    a. Nuclear (08/2013):  No ischemia, EF 80%, Normal  . Hx of echocardiogram    a. Echo (07/2013):  Mild LVH, vigorous LVF, EF 65-70%, Gr 1 DD, mild MR, mild to mod LAE  . Hyperlipidemia    intol of statins  . Hypertension   . NHL (non-Hodgkin's lymphoma) ([place]) dx'd 2003   chemo/xrt comp 2003  . Personal history of colonic polyps   . Proctitis   . Urticaria     Past Surgical History:  Procedure Laterality Date  . cardiac cath-neg    . CORONARY ANGIOPLASTY WITH STENT PLACEMENT     x 2  . dexa-neg    . KNEE SURGERY  10/2003   left  . laser surgery for cataracts-left    . stress cardiolite    . VAGINAL HYSTERECTOMY     partial, fibroids, one ovary left    Social History   Socioeconomic History  . Marital status: Married    Spouse name: Not on file  . Number of children: Not on file  . Years of education: Not on file  . Highest education level: Not on file  Social Needs  . Financial resource strain: Not on file   . Food insecurity - worry: Not on file  . Food insecurity - inability: Not on file  . Transportation needs - medical: Not on file  . Transportation needs - non-medical: Not on file  Occupational History  . Occupation: Retired    Fish farm manager: RETIRED  Tobacco Use  . Smoking status: Never Smoker  . Smokeless tobacco: Never Used  Substance and Sexual Activity  . Alcohol use: No  . Drug use: No  . Sexual activity: No  Other Topics Concern  . Not on file  Social History Narrative  . Not on file    Family History  Problem Relation Age of Onset  . Cancer Mother        jaw  . Hypertension Father   . Heart attack Father   . Heart attack Sister   . Colon cancer Neg Hx     Allergies  Allergen Reactions  . Other Rash and Anaphylaxis  . Shellfish Allergy Anaphylaxis and Rash  . Cephalexin     REACTION: trash and swelling REACTION: trash and swelling  . Ciprofloxacin     REACTION: u/k  . Clopidogrel Bisulfate     REACTION: swelling, rash  . Codeine Phosphate     REACTION: unspecified  . Ezetimibe-Simvastatin     REACTION: myalgias REACTION: myalgias  . Hydrocod Polst-Cpm Polst Er     REACTION: u/k  . Lidocaine Hives    ONLY TO ADHESIVE LIDOCAINE PATCHES. NO PROBLEM WITH INJECTABLE LIDOCAINE.  Marland Kitchen Nitrofurantoin     REACTION: [place] REACTION: [place]  . Pregabalin     REACTION: felt bad REACTION: felt bad  . Propoxyphene N-Acetaminophen     REACTION: [place]  . Rofecoxib     unk unk   . Sulfamethoxazole     REACTION: unspecified  . Sulfonamide Derivatives     REACTION: unspecified  . Celecoxib Rash    REACTION: [place] REACTION: [place]  . Codeine Rash    REACTION: [place] REACTION: [place]  . Hydrocod Polst-Cpm Polst Er Rash  . Propoxyphene Rash  . Sulfa Antibiotics Rash    REACTION: unspecified REACTION: unspecified    Medication list reviewed and updated in full in [place].  GEN: No fevers, chills. Nontoxic. Primarily MSK c/o today. MSK: Detailed in the HPI GI: tolerating  PO intake without difficulty Neuro: No numbness, parasthesias, or tingling associated. Otherwise the pertinent positives of the ROS are noted above.   Objective:   BP 140/70   Pulse 75   Temp 98.3 F (36.8 C) (Oral)   Ht 5' 8"  (1.727 m)   Wt 176 lb (79.8 kg)   BMI 26.76 kg/m    GEN: Well-developed,well-nourished,in no acute distress; alert,appropriate and cooperative throughout examination HEENT: Normocephalic and atraumatic without obvious abnormalities. Ears, externally no deformities PULM: Breathing comfortably  in no respiratory distress EXT: No clubbing, cyanosis, or edema PSYCH: Normally interactive. Cooperative during the interview. Pleasant. Friendly and conversant. Not anxious or depressed appearing. Normal, full affect.  CERVICAL SPINE EXAM Range of motion: Flexion, extension, lateral bending, and rotation: approx 30% loss of motion in all directions Pain with terminal motion: y Spinous Processes: NT SCM: NT Upper paracervical muscles: ttp Upper traps: ttp C5-T1 intact, sensation and motor   Radiology: No results found.  Assessment and Plan:   Cervicalgia   Almost certainly the patient has cervical spondyloarthropathy as well as DDD.  She has about 20 different allergies listed.  I gave her a course of some prednisone and reviewed some range of motion that she can do multiple times a day, and likely will improve.  Follow-up: No Follow-up on file.   Meds ordered this encounter  Medications  . predniSONE (DELTASONE) 20 MG tablet    Sig: 2 tabs po for 5 days, then 1 tab po for 5 days    Dispense:  15 tablet    Refill:  0   Signed,  Akasia Ahmad T. Deaysia Grigoryan, MD   Allergies as of 10/10/2017      Reactions   Other Rash, Anaphylaxis   Shellfish Allergy Anaphylaxis, Rash   Cephalexin    REACTION: trash and swelling REACTION: trash and swelling   Ciprofloxacin    REACTION: u/k   Clopidogrel Bisulfate    REACTION: swelling, rash   Codeine Phosphate     REACTION: unspecified   Ezetimibe-simvastatin    REACTION: myalgias REACTION: myalgias   Hydrocod Polst-cpm Polst Er    REACTION: u/k   Lidocaine Hives   ONLY TO ADHESIVE LIDOCAINE PATCHES. NO PROBLEM WITH INJECTABLE LIDOCAINE.   Nitrofurantoin    REACTION: [place] REACTION: [place]   Pregabalin    REACTION: felt bad REACTION: felt bad   Propoxyphene N-acetaminophen    REACTION: [place]   Rofecoxib    unk unk   Sulfamethoxazole    REACTION: unspecified   Sulfonamide Derivatives    REACTION: unspecified   Celecoxib Rash   REACTION: [place] REACTION: [place]   Codeine Rash   REACTION: [place] REACTION: [place]   Hydrocod Polst-cpm Polst Er Rash   Propoxyphene Rash   Sulfa Antibiotics Rash   REACTION: unspecified REACTION: unspecified      Medication List        Accurate as of 10/10/17 11:59 PM. Always use your most recent med list.          ASPIRIN LOW DOSE 81 MG tablet Generic drug:  aspirin Take 81 mg by mouth daily.   COMBIGAN 0.2-0.5 % ophthalmic solution Generic drug:  brimonidine-timolol Place 1 drop into both eyes every 12 (twelve) hours.   cyclobenzaprine 5 MG tablet Commonly known as:  FLEXERIL Take 1 tablet (5 mg total) by mouth at bedtime.   ezetimibe 10 MG tablet Commonly known as:  ZETIA Take 1 tablet (10 mg total) by mouth daily.   glipiZIDE 5 MG tablet Commonly known as:  GLUCOTROL TAKE 1 TABLET BY MOUTH TWICE A DAY.   isosorbide mononitrate 60 MG 24 hr tablet Commonly known as:  IMDUR Take 1 tablet (60 mg total) by mouth daily.   losartan 50 MG tablet Commonly known as:  COZAAR Take 1 tablet (50 mg total) by mouth daily.   metoprolol succinate 100 MG 24 hr tablet Commonly known as:  TOPROL-XL Take 1 tablet (100 mg total) by mouth every morning. Take with or immediately following a meal.  nitroGLYCERIN 0.4 MG SL tablet Commonly known as:  NITROSTAT Place 1 tablet (0.4 mg total) under the tongue every 5 (five) minutes as needed for chest pain.     omeprazole-sodium bicarbonate 40-1100 MG capsule Commonly known as:  ZEGERID Take 1 capsule by mouth daily before breakfast.   ONE TOUCH LANCETS Misc 1 each by Does not apply route 2 (two) times daily.   ONE TOUCH ULTRA TEST test strip Generic drug:  glucose blood TEST TWICE DAILY   ONETOUCH VERIO FLEX SYSTEM w/Device Kit 1 Device by Does not apply route once.   OVER THE COUNTER MEDICATION Take 1 tablet by mouth daily. Med name: VISION FORMULA   predniSONE 20 MG tablet Commonly known as:  DELTASONE 2 tabs po for 5 days, then 1 tab po for 5 days   traZODone 50 MG tablet Commonly known as:  DESYREL Take 0.5-1 tablets (25-50 mg total) by mouth at bedtime as needed for sleep.   trimethoprim 100 MG tablet Commonly known as:  TRIMPEX Take 100 mg by mouth 2 (two) times daily as needed (UTI).   vitamin B-12 1000 MCG tablet Commonly known as:  CYANOCOBALAMIN Take 1,000 mcg by mouth daily.   Vitamin D-3 1000 units Caps Take 1 capsule by mouth daily.   VYZULTA 0.024 % Soln Generic drug:  Latanoprostene Bunod Apply 1 drop to eye at bedtime.

## 2017-10-17 ENCOUNTER — Telehealth: Payer: Self-pay | Admitting: Family Medicine

## 2017-10-17 NOTE — Telephone Encounter (Signed)
Copied from [place] [phone]. Topic: Quick Communication - See Telephone Encounter >> Oct 17, 2017  3:51 PM Ether Griffins B wrote: CRM for notification. See Telephone encounter for:  Pt taking omeprazole and [place] cover this medicine anymore. Pt is needing another med called in. Pt says its ok to leave detailed VM  10/17/17.

## 2017-10-25 NOTE — Telephone Encounter (Signed)
[place]-[employer] Medicare does not cover the Zegerid that patient is currently taking/I have researched her formulary and they will cover the following:  Omeprazole 20mg  Pantoprazole 20mg ; 40mg  Rebeprazole Esomeprazole  Plz advise your thoughts/thx dmf

## 2017-10-26 MED ORDER — PANTOPRAZOLE SODIUM 40 MG PO TBEC: 40.0000 mg | DELAYED_RELEASE_TABLET | Freq: Every day | ORAL | 3 refills | Status: DC

## 2017-10-26 NOTE — Telephone Encounter (Signed)
Ok to switch to pantoprazole 40mg , same sig

## 2017-10-26 NOTE — Telephone Encounter (Signed)
Pt aware to Marji Kuehnel/C Zegerid and Start Pantoprazole 40mg  1qd/faxed to pharmacy/thx dmf

## 2017-10-27 ENCOUNTER — Ambulatory Visit: Payer: Medicare Other | Admitting: Podiatry

## 2017-10-27 ENCOUNTER — Encounter: Payer: Self-pay | Admitting: Podiatry

## 2017-10-27 DIAGNOSIS — E119 Type 2 diabetes mellitus without complications: Secondary | ICD-10-CM | POA: Diagnosis not present

## 2017-10-27 DIAGNOSIS — M79674 Pain in right toe(s): Secondary | ICD-10-CM | POA: Diagnosis not present

## 2017-10-27 DIAGNOSIS — B351 Tinea unguium: Secondary | ICD-10-CM

## 2017-10-27 DIAGNOSIS — M79675 Pain in left toe(s): Secondary | ICD-10-CM

## 2017-10-27 NOTE — Progress Notes (Signed)
Complaint:  Visit Type: Patient returns to my office for continued preventative foot care services. Complaint: Patient states" my nails have grown long and thick and become painful to walk and wear shoes" Patient has been diagnosed with DM with no foot complications. The patient presents for preventative foot care services. No changes to ROS  Podiatric Exam: Vascular: dorsalis pedis and posterior tibial pulses are palpable bilateral. Capillary return is immediate. Temperature gradient is WNL. Skin turgor WNL  Sensorium: Normal Semmes Weinstein monofilament test. Normal tactile sensation bilaterally. Nail Exam: Pt has thick disfigured discolored nails with subungual debris noted bilateral entire nail hallux through fifth toenails Ulcer Exam: There is no evidence of ulcer or pre-ulcerative changes or infection. Orthopedic Exam: Muscle tone and strength are WNL. No limitations in general ROM. No crepitus or effusions noted. HAV  B/l .  Hammer toes second  B/L Skin: No Porokeratosis. No infection or ulcers  Diagnosis:  Onychomycosis, , Pain in right toe, pain in left toes  Treatment & Plan Procedures and Treatment: Consent by patient was obtained for treatment procedures.   Debridement of mycotic and hypertrophic toenails, 1 through 5 bilateral and clearing of subungual debris. No ulceration, no infection noted.  Return Visit-Office Procedure: Patient instructed to return to the office for a follow up visit 3 months for continued evaluation and treatment.    Gardiner Barefoot DPM

## 2017-11-12 ENCOUNTER — Other Ambulatory Visit: Payer: Self-pay | Admitting: Family Medicine

## 2017-12-14 ENCOUNTER — Ambulatory Visit: Payer: Medicare Other | Admitting: Family Medicine

## 2017-12-14 ENCOUNTER — Encounter: Payer: Self-pay | Admitting: Family Medicine

## 2017-12-14 ENCOUNTER — Other Ambulatory Visit: Payer: Self-pay

## 2017-12-14 VITALS — BP 120/62 | HR 70 | Temp 98.6°F | Ht 68.0 in | Wt 175.0 lb

## 2017-12-14 DIAGNOSIS — J029 Acute pharyngitis, unspecified: Secondary | ICD-10-CM | POA: Diagnosis not present

## 2017-12-14 DIAGNOSIS — J069 Acute upper respiratory infection, unspecified: Secondary | ICD-10-CM

## 2017-12-14 LAB — POCT RAPID STREP A (OFFICE): Rapid Strep A Screen: NEGATIVE

## 2017-12-14 NOTE — Progress Notes (Signed)
Dr. Frederico Hamman T. Neytiri Asche, MD, [facility] [place], [address] Phone: [phone] Fax: [phone]  12/14/2017  Patient: [name] [name], MRN: [id number], DOB: 1932/01/17, 84 y.o.  Primary Physician:  Lucille Passy, MD   Chief Complaint  Patient presents with  . Sore Throat   Subjective:   This 84 y.o. female patient presents with runny nose, sneezing, cough, sore throat, malaise and minimal / low-grade fever .   ? recent exposure to others with similar symptoms.   The patent denies sore throat as the primary complaint. Denies sthortness of breath/wheezing, high fever, chest pain, rhinits for more than 14 days, significant myalgia, otalgia, facial pain, abdominal pain, changes in bowel or bladder.  PMH, PHS, Allergies, Problem List, Medications, Family History, and Social History have all been reviewed.  Patient Active Problem List   Diagnosis Date Noted  . Neck pain 09/27/2017  . Insomnia 09/27/2017  . Situational insomnia 01/26/2017  . Stress due to illness of family member 05/04/2016  . Pain in joints of both feet 01/14/2016  . Osteopenia 08/06/2015  . Constipation 12/30/2014  . B12 deficiency 12/30/2014  . Allergic reaction 09/02/2014  . Arthralgia of right foot 07/18/2014  . Pruritic intertrigo 07/18/2014  . Right hip pain 06/04/2014  . Other malaise and fatigue 06/04/2014  . Elevated LFTs 04/19/2014  . Mitral regurgitation 04/19/2014  . Pes planus 06/13/2013  . Renal insufficiency 06/07/2011  . Diabetes mellitus with renal manifestations, controlled ([place]) 05/31/2011  . MIXED HYPERLIPIDEMIA 08/04/2010  . BEN HTN HEART DISEASE WITHOUT HEART FAIL 08/04/2010  . LUMBAR SPRAIN AND STRAIN 01/15/2010  . OSTEOPENIA 04/29/2009  . 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 02/25/2009  . EXTERNAL HEMORRHOIDS 02/24/2009  . DIVERTICULOSIS, COLON 02/24/2009  . LYMPHOMA, MALT 08/28/2007  . DETACHED RETINA  08/28/2007  . LABILE HYPERTENSION 08/28/2007  . Coronary atherosclerosis 08/28/2007  . MITRAL VALVE PROLAPSE 08/28/2007  . GERD 08/28/2007  . IBS 08/28/2007  . BREAST CYSTS, BILATERAL 08/28/2007    Past Medical History:  Diagnosis Date  . Allergy    Cipro, Plavix, Statins  . CAD (coronary artery disease)    a. s/p tandem Promus DES to LAD in 2009 by Dr. Olevia Perches;  b.  LHC (03/22/14):  prox LAD 30%, mid LAD 40%, LAD stent ok with dist 20% ISR, apical LAD occluded with L-L collats filling apical vessel (too small for PCI), mid RCA 20%, EF 70%.  Med Rx  . Diabetes mellitus    Diagnosed 2012  . Diverticulosis of colon (without mention of hemorrhage)   . GERD (gastroesophageal reflux disease)   . Hx of cardiovascular stress test    a. Nuclear (08/2013):  No ischemia, EF 80%, Normal  . Hx of echocardiogram    a. Echo (07/2013):  Mild LVH, vigorous LVF, EF 65-70%, Gr 1 DD, mild MR, mild to mod LAE  . Hyperlipidemia    intol of statins  . Hypertension   . NHL (non-Hodgkin's lymphoma) ([place]) dx'd 2003   chemo/xrt comp 2003  . Personal history of colonic polyps   . Proctitis   . Urticaria     Past Surgical History:  Procedure Laterality Date  . cardiac cath-neg    . CORONARY ANGIOPLASTY WITH STENT PLACEMENT     x 2  . dexa-neg    . KNEE SURGERY  10/2003   left  . laser surgery for cataracts-left    . LEFT HEART CATHETERIZATION WITH  CORONARY ANGIOGRAM N/A 03/22/2014   Procedure: LEFT HEART CATHETERIZATION WITH CORONARY ANGIOGRAM;  Surgeon: Burnell Blanks, MD;  Location: [facility] CATH LAB;  Service: Cardiovascular;  Laterality: N/A;  . stress cardiolite    . VAGINAL HYSTERECTOMY     partial, fibroids, one ovary left    Social History   Socioeconomic History  . Marital status: Married    Spouse name: Not on file  . Number of children: Not on file  . Years of education: Not on file  . Highest education level: Not on file  Social Needs  . Financial resource strain: Not on file    . Food insecurity - worry: Not on file  . Food insecurity - inability: Not on file  . Transportation needs - medical: Not on file  . Transportation needs - non-medical: Not on file  Occupational History  . Occupation: Retired    Fish farm manager: RETIRED  Tobacco Use  . Smoking status: Never Smoker  . Smokeless tobacco: Never Used  Substance and Sexual Activity  . Alcohol use: No  . Drug use: No  . Sexual activity: No  Other Topics Concern  . Not on file  Social History Narrative  . Not on file    Family History  Problem Relation Age of Onset  . Cancer Mother        jaw  . Hypertension Father   . Heart attack Father   . Heart attack Sister   . Colon cancer Neg Hx     Allergies  Allergen Reactions  . Other Rash and Anaphylaxis  . Shellfish Allergy Anaphylaxis and Rash  . Cephalexin     REACTION: trash and swelling REACTION: trash and swelling  . Ciprofloxacin     REACTION: u/k  . Clopidogrel Bisulfate     REACTION: swelling, rash  . Codeine Phosphate     REACTION: unspecified  . Ezetimibe-Simvastatin     REACTION: myalgias REACTION: myalgias  . Hydrocod Polst-Cpm Polst Er     REACTION: u/k  . Lidocaine Hives    ONLY TO ADHESIVE LIDOCAINE PATCHES. NO PROBLEM WITH INJECTABLE LIDOCAINE.  Marland Kitchen Nitrofurantoin     REACTION: [place] REACTION: [place]  . Pregabalin     REACTION: felt bad REACTION: felt bad  . Propoxyphene N-Acetaminophen     REACTION: [place]  . Rofecoxib     unk unk   . Sulfamethoxazole     REACTION: unspecified  . Sulfonamide Derivatives     REACTION: unspecified  . Celecoxib Rash    REACTION: [place] REACTION: [place]  . Codeine Rash    REACTION: [place] REACTION: [place]  . Hydrocod Polst-Cpm Polst Er Rash  . Propoxyphene Rash  . Sulfa Antibiotics Rash    REACTION: unspecified REACTION: unspecified    Medication list reviewed and updated in full in [place].  ROS as above, eating and drinking - tolerating PO. Urinating normally. No excessive vomitting or  diarrhea. O/w as above.  Objective:   Blood pressure 120/62, pulse 70, temperature 98.6 F (37 C), temperature source Oral, height 5' 8"  (1.727 m), weight 175 lb (79.4 kg).  GEN: WDWN, Non-toxic, Atraumatic, normocephalic. A and O x 3. HEENT: Oropharynx clear without exudate, MMM, no significant LAD, mild rhinnorhea Ears: TM clear, COL visualized with good landmarks CV: RRR, no m/g/r. Pulm: CTA B, no wheezes, rhonchi, or crackles, normal respiratory effort. EXT: no c/c/e Psych: well oriented, neither depressed nor anxious in appearance  Objective Data: Results for orders placed or performed in visit on  12/14/17  POCT rapid strep A  Result Value Ref Range   Rapid Strep A Screen Negative Negative    Assessment and Plan:   Acute URI  Sore throat - Plan: POCT rapid strep A  Supportive care reviewed with patient. See patient instruction section.  Follow-up: No Follow-up on file.  Orders Placed This Encounter  Procedures  . POCT rapid strep A    Signed,  Charlsie Fleeger T. Snyder Colavito, MD   Patient's Medications  New Prescriptions   No medications on file  Previous Medications   ASPIRIN (ASPIRIN LOW DOSE) 81 MG TABLET    Take 81 mg by mouth daily.     BLOOD GLUCOSE MONITORING SUPPL (ONETOUCH VERIO FLEX SYSTEM) W/DEVICE KIT    1 Device by Does not apply route once.   BRIMONIDINE (ALPHAGAN) 0.2 % OPHTHALMIC SOLUTION       BRIMONIDINE-TIMOLOL (COMBIGAN) 0.2-0.5 % OPHTHALMIC SOLUTION    Place 1 drop into both eyes every 12 (twelve) hours.   CHOLECALCIFEROL (VITAMIN D-3) 1000 UNITS CAPS    Take 1 capsule by mouth daily.   CYCLOBENZAPRINE (FLEXERIL) 5 MG TABLET    Take 1 tablet (5 mg total) by mouth at bedtime.   EZETIMIBE (ZETIA) 10 MG TABLET    Take 1 tablet (10 mg total) by mouth daily.   GLIPIZIDE (GLUCOTROL) 5 MG TABLET    TAKE ONE TABLET BY MOUTH TWICE DAILY   ISOSORBIDE MONONITRATE (IMDUR) 60 MG 24 HR TABLET    Take 1 tablet (60 mg total) by mouth daily.   LATANOPROSTENE BUNOD  (VYZULTA) 0.024 % SOLN    Apply 1 drop to eye at bedtime.   LOSARTAN (COZAAR) 50 MG TABLET    Take 1 tablet (50 mg total) by mouth daily.   METOPROLOL SUCCINATE (TOPROL-XL) 100 MG 24 HR TABLET    Take 1 tablet (100 mg total) by mouth every morning. Take with or immediately following a meal.   NITROGLYCERIN (NITROSTAT) 0.4 MG SL TABLET    Place 1 tablet (0.4 mg total) under the tongue every 5 (five) minutes as needed for chest pain.   ONE TOUCH LANCETS MISC    1 each by Does not apply route 2 (two) times daily.   ONE TOUCH ULTRA TEST TEST STRIP    TEST TWICE DAILY   OVER THE COUNTER MEDICATION    Take 1 tablet by mouth daily. Med name: VISION FORMULA   PANTOPRAZOLE (PROTONIX) 40 MG TABLET    Take 1 tablet (40 mg total) by mouth daily.   PREDNISONE (DELTASONE) 20 MG TABLET    2 tabs po for 5 days, then 1 tab po for 5 days   TIMOLOL (TIMOPTIC) 0.5 % OPHTHALMIC SOLUTION       TRAZODONE (DESYREL) 50 MG TABLET    Take 0.5-1 tablets (25-50 mg total) by mouth at bedtime as needed for sleep.   TRIMETHOPRIM (TRIMPEX) 100 MG TABLET    Take 100 mg by mouth 2 (two) times daily as needed (UTI).    VITAMIN B-12 (CYANOCOBALAMIN) 1000 MCG TABLET    Take 1,000 mcg by mouth daily.  Modified Medications   No medications on file  Discontinued Medications   No medications on file

## 2017-12-26 ENCOUNTER — Encounter: Payer: Self-pay | Admitting: Internal Medicine

## 2017-12-26 ENCOUNTER — Ambulatory Visit: Payer: Medicare Other | Admitting: Internal Medicine

## 2017-12-26 VITALS — BP 112/70 | HR 71 | Temp 98.1°F | Wt 175.0 lb

## 2017-12-26 DIAGNOSIS — J0140 Acute pansinusitis, unspecified: Secondary | ICD-10-CM | POA: Diagnosis not present

## 2017-12-26 MED ORDER — AMOXICILLIN 500 MG PO TABS: 1000.0000 mg | ORAL_TABLET | Freq: Two times a day (BID) | ORAL | 0 refills | Status: AC

## 2017-12-26 NOTE — Progress Notes (Signed)
Subjective:    Patient ID: [name] [name], female    DOB: 10/09/1932, 84 y.o.   MRN: [id number]  HPI Here due to ongoing respiratory symptoms  The cold improved but then seemed to worsen with head congestion Cough did get better Going on for 4-5 days Nasty nasal drainage Some post nasal drip--then spits it out Right nose feels sore No fever Sore throat is better now Headache if bends over No ear pain  Delsym, sudafed--but her symptoms worsened  Current Outpatient Medications on File Prior to Visit  Medication Sig Dispense Refill  . aspirin (ASPIRIN LOW DOSE) 81 MG tablet Take 81 mg by mouth daily.      . Blood Glucose Monitoring Suppl (Jack) w/Device KIT 1 Device by Does not apply route once. 1 kit 0  . brimonidine (ALPHAGAN) 0.2 % ophthalmic solution     . brimonidine-timolol (COMBIGAN) 0.2-0.5 % ophthalmic solution Place 1 drop into both eyes every 12 (twelve) hours.    . Cholecalciferol (VITAMIN D-3) 1000 UNITS CAPS Take 1 capsule by mouth daily.    Marland Kitchen ezetimibe (ZETIA) 10 MG tablet Take 1 tablet (10 mg total) by mouth daily. 90 tablet 3  . glipiZIDE (GLUCOTROL) 5 MG tablet TAKE ONE TABLET BY MOUTH TWICE DAILY 60 tablet 0  . isosorbide mononitrate (IMDUR) 60 MG 24 hr tablet Take 1 tablet (60 mg total) by mouth daily. 90 tablet 3  . Latanoprostene Bunod (VYZULTA) 0.024 % SOLN Apply 1 drop to eye at bedtime.    Marland Kitchen losartan (COZAAR) 50 MG tablet Take 1 tablet (50 mg total) by mouth daily. 90 tablet 3  . metoprolol succinate (TOPROL-XL) 100 MG 24 hr tablet Take 1 tablet (100 mg total) by mouth every morning. Take with or immediately following a meal. 90 tablet 3  . nitroGLYCERIN (NITROSTAT) 0.4 MG SL tablet Place 1 tablet (0.4 mg total) under the tongue every 5 (five) minutes as needed for chest pain. 26 tablet 6  . ONE TOUCH LANCETS MISC 1 each by Does not apply route 2 (two) times daily.    . ONE TOUCH ULTRA TEST test strip TEST TWICE DAILY 100 each 10  . OVER  THE COUNTER MEDICATION Take 1 tablet by mouth daily. Med name: VISION FORMULA    . pantoprazole (PROTONIX) 40 MG tablet Take 1 tablet (40 mg total) by mouth daily. 30 tablet 3  . trimethoprim (TRIMPEX) 100 MG tablet Take 100 mg by mouth 2 (two) times daily as needed (UTI).     . vitamin B-12 (CYANOCOBALAMIN) 1000 MCG tablet Take 1,000 mcg by mouth daily.    . [DISCONTINUED] loratadine (CLARITIN) 10 MG tablet Take 10 mg by mouth as needed.      No current facility-administered medications on file prior to visit.     Allergies  Allergen Reactions  . Other Rash and Anaphylaxis  . Shellfish Allergy Anaphylaxis and Rash  . Cephalexin     REACTION: trash and swelling REACTION: trash and swelling  . Ciprofloxacin     REACTION: u/k  . Clopidogrel Bisulfate     REACTION: swelling, rash  . Codeine Phosphate     REACTION: unspecified  . Ezetimibe-Simvastatin     REACTION: myalgias REACTION: myalgias  . Hydrocod Polst-Cpm Polst Er     REACTION: u/k  . Lidocaine Hives    ONLY TO ADHESIVE LIDOCAINE PATCHES. NO PROBLEM WITH INJECTABLE LIDOCAINE.  Marland Kitchen Nitrofurantoin     REACTION: [place] REACTION: [place]  . Pregabalin  REACTION: felt bad REACTION: felt bad  . Propoxyphene N-Acetaminophen     REACTION: [place]  . Rofecoxib     unk unk   . Sulfamethoxazole     REACTION: unspecified  . Sulfonamide Derivatives     REACTION: unspecified  . Celecoxib Rash    REACTION: [place] REACTION: [place]  . Codeine Rash    REACTION: [place] REACTION: [place]  . Hydrocod Polst-Cpm Polst Er Rash  . Propoxyphene Rash  . Sulfa Antibiotics Rash    REACTION: unspecified REACTION: unspecified    Past Medical History:  Diagnosis Date  . Allergy    Cipro, Plavix, Statins  . CAD (coronary artery disease)    a. s/p tandem Promus DES to LAD in 2009 by Dr. Olevia Perches;  b.  LHC (03/22/14):  prox LAD 30%, mid LAD 40%, LAD stent ok with dist 20% ISR, apical LAD occluded with L-L collats filling apical vessel (too small for PCI), mid RCA  20%, EF 70%.  Med Rx  . Diabetes mellitus    Diagnosed 2012  . Diverticulosis of colon (without mention of hemorrhage)   . GERD (gastroesophageal reflux disease)   . Hx of cardiovascular stress test    a. Nuclear (08/2013):  No ischemia, EF 80%, Normal  . Hx of echocardiogram    a. Echo (07/2013):  Mild LVH, vigorous LVF, EF 65-70%, Gr 1 DD, mild MR, mild to mod LAE  . Hyperlipidemia    intol of statins  . Hypertension   . NHL (non-Hodgkin's lymphoma) ([place]) dx'd 2003   chemo/xrt comp 2003  . Personal history of colonic polyps   . Proctitis   . Urticaria     Past Surgical History:  Procedure Laterality Date  . cardiac cath-neg    . CORONARY ANGIOPLASTY WITH STENT PLACEMENT     x 2  . dexa-neg    . KNEE SURGERY  10/2003   left  . laser surgery for cataracts-left    . LEFT HEART CATHETERIZATION WITH CORONARY ANGIOGRAM N/A 03/22/2014   Procedure: LEFT HEART CATHETERIZATION WITH CORONARY ANGIOGRAM;  Surgeon: Burnell Blanks, MD;  Location: [facility] CATH LAB;  Service: Cardiovascular;  Laterality: N/A;  . stress cardiolite    . VAGINAL HYSTERECTOMY     partial, fibroids, one ovary left    Family History  Problem Relation Age of Onset  . Cancer Mother        jaw  . Hypertension Father   . Heart attack Father   . Heart attack Sister   . Colon cancer Neg Hx     Social History   Socioeconomic History  . Marital status: Married    Spouse name: Not on file  . Number of children: Not on file  . Years of education: Not on file  . Highest education level: Not on file  Social Needs  . Financial resource strain: Not on file  . Food insecurity - worry: Not on file  . Food insecurity - inability: Not on file  . Transportation needs - medical: Not on file  . Transportation needs - non-medical: Not on file  Occupational History  . Occupation: Retired    Fish farm manager: RETIRED  Tobacco Use  . Smoking status: Never Smoker  . Smokeless tobacco: Never Used  Substance and Sexual  Activity  . Alcohol use: No  . Drug use: No  . Sexual activity: No  Other Topics Concern  . Not on file  Social History Narrative  . Not on file   Review of  Systems  Trying vaporizer to improve moisture in air No rash No vomiting or diarrhea Appetite is off some---trying to be careful Sugars high lately -- 180-190 fasting     Objective:   Physical Exam  Constitutional: She appears well-developed. No distress.  HENT:  Mouth/Throat: Oropharynx is clear and moist. No oropharyngeal exudate.  Moderate maxillary and mild frontal tenderness Moderate nasal inflammation TMs fine   Neck: No thyromegaly present.  Pulmonary/Chest: Effort normal and breath sounds normal. No respiratory distress. She has no wheezes. She has no rales.  Lymphadenopathy:    She has no cervical adenopathy.          Assessment & Plan:

## 2017-12-26 NOTE — Assessment & Plan Note (Signed)
Sick for over 2 weeks and sudden change ----likely secondary bacterial sinus infection Discussed supportive care--analgesics amoxil

## 2018-01-12 ENCOUNTER — Encounter: Payer: Self-pay | Admitting: Primary Care

## 2018-01-12 ENCOUNTER — Ambulatory Visit: Payer: Medicare Other | Admitting: Primary Care

## 2018-01-12 VITALS — BP 102/64 | HR 68 | Temp 97.9°F | Wt 171.0 lb

## 2018-01-12 DIAGNOSIS — K219 Gastro-esophageal reflux disease without esophagitis: Secondary | ICD-10-CM

## 2018-01-12 DIAGNOSIS — I10 Essential (primary) hypertension: Secondary | ICD-10-CM | POA: Diagnosis not present

## 2018-01-12 DIAGNOSIS — N390 Urinary tract infection, site not specified: Secondary | ICD-10-CM | POA: Diagnosis not present

## 2018-01-12 DIAGNOSIS — E782 Mixed hyperlipidemia: Secondary | ICD-10-CM | POA: Diagnosis not present

## 2018-01-12 DIAGNOSIS — E0821 Diabetes mellitus due to underlying condition with diabetic nephropathy: Secondary | ICD-10-CM

## 2018-01-12 DIAGNOSIS — E1121 Type 2 diabetes mellitus with diabetic nephropathy: Secondary | ICD-10-CM

## 2018-01-12 NOTE — Assessment & Plan Note (Signed)
Repeat A1C pending today. Based off of glucose readings, seems like A1C may be higher than last. Consider increasing glipizide 5 mg BID, cannot tolerate metformin.

## 2018-01-12 NOTE — Progress Notes (Signed)
Subjective:    Patient ID: [name] [name], female    DOB: Feb 15, 1932, 84 y.o.   MRN: [id number]  HPI  [name] is an 84 year old female who presents today to transfer care.  1) Essential Hypertension: Currently managed on losartan 50 mg, metoprolol succinate 100 mg, isosorbide mononitrate 60 mg. She is currently following with [facility] every 6 months. She denies dizziness, chest pain, shortness of breath.   BP Readings from Last 3 Encounters:  01/12/18 102/64  12/26/17 112/70  12/14/17 120/62     2) Glaucoma: Currently managed on Alphagan, Combigan, Vyzulta. Currently following with ophthalmology.  3) Recurrent UTI: Currently prescribed trimethoprim 100 mg PRN. She'll start this medication when she starts to feel symptoms of UTI's. She'll drink cranberry juice. She's following with Urology and recently had a follow up exam.  4) GERD: Currently managed on pantoprazole 40 mg. She has symptoms of belching and esophageal burning without her medication.   5) Hyperlipidemia: Currently managed on Zetia 10 mg and aspirin 81 mg. No recent lipid panel on file.   6) Type 2 Diabetes: Currently managed on glipizide 5 mg once daily. Her last A1C was 8.0 in August 2018. She's checking her blood sugars every morning fasting and typically gets readings of 90-130's. Over the past several months her blood sugar is running 160's-170's. 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 She was once managed on Metformin which caused GI upset. History of colon cancer.  7) Osteoarthritis: Located to neck and hips. Following with Dr. [name] and has had injections to the hip in the past. Overall feels she's doing well.  Review of Systems  Eyes:       Glaucoma   Respiratory: Negative for shortness of breath.   Cardiovascular: Negative for chest pain.  Gastrointestinal:       GERD  Musculoskeletal:       Chronic neck and hip pain  Neurological: Negative for  dizziness, numbness and headaches.       Past Medical History:  Diagnosis Date  . Allergy    Cipro, Plavix, Statins  . CAD (coronary artery disease)    a. s/p tandem Promus DES to LAD in 2009 by Dr. Olevia Perches;  b.  LHC (03/22/14):  prox LAD 30%, mid LAD 40%, LAD stent ok with dist 20% ISR, apical LAD occluded with L-L collats filling apical vessel (too small for PCI), mid RCA 20%, EF 70%.  Med Rx  . Diabetes mellitus    Diagnosed 2012  . Diverticulosis of colon (without mention of hemorrhage)   . GERD (gastroesophageal reflux disease)   . Hx of cardiovascular stress test    a. Nuclear (08/2013):  No ischemia, EF 80%, Normal  . Hx of echocardiogram    a. Echo (07/2013):  Mild LVH, vigorous LVF, EF 65-70%, Gr 1 DD, mild MR, mild to mod LAE  . Hyperlipidemia    intol of statins  . Hypertension   . NHL (non-Hodgkin's lymphoma) ([place]) dx'd 2003   chemo/xrt comp 2003  . Personal history of colonic polyps   . Proctitis   . Urticaria      Social History   Socioeconomic History  . Marital status: Married    Spouse name: Not on file  . Number of children: Not on file  . Years of education: Not on file  . Highest education level: Not on file  Social Needs  . Financial resource strain:  Not on file  . Food insecurity - worry: Not on file  . Food insecurity - inability: Not on file  . Transportation needs - medical: Not on file  . Transportation needs - non-medical: Not on file  Occupational History  . Occupation: Retired    Fish farm manager: RETIRED  Tobacco Use  . Smoking status: Never Smoker  . Smokeless tobacco: Never Used  Substance and Sexual Activity  . Alcohol use: No  . Drug use: No  . Sexual activity: No  Other Topics Concern  . Not on file  Social History Narrative  . Not on file    Past Surgical History:  Procedure Laterality Date  . cardiac cath-neg    . CORONARY ANGIOPLASTY WITH STENT PLACEMENT     x 2  . dexa-neg    . KNEE SURGERY  10/2003   left  . laser surgery  for cataracts-left    . LEFT HEART CATHETERIZATION WITH CORONARY ANGIOGRAM N/A 03/22/2014   Procedure: LEFT HEART CATHETERIZATION WITH CORONARY ANGIOGRAM;  Surgeon: Burnell Blanks, MD;  Location: [facility] CATH [facility];  Service: Cardiovascular;  Laterality: N/A;  . stress cardiolite    . VAGINAL HYSTERECTOMY     partial, fibroids, one ovary left    Family History  Problem Relation Age of Onset  . Cancer Mother        jaw  . Hypertension Father   . Heart attack Father   . Heart attack Sister   . Colon cancer Neg Hx     Allergies  Allergen Reactions  . Other Rash and Anaphylaxis  . Shellfish Allergy Anaphylaxis and Rash  . Cephalexin     REACTION: trash and swelling REACTION: trash and swelling  . Ciprofloxacin     REACTION: u/k  . Clopidogrel Bisulfate     REACTION: swelling, rash  . Codeine Phosphate     REACTION: unspecified  . Ezetimibe-Simvastatin     REACTION: myalgias REACTION: myalgias  . Hydrocod Polst-Cpm Polst Er     REACTION: u/k  . Lidocaine Hives    ONLY TO ADHESIVE LIDOCAINE PATCHES. NO PROBLEM WITH INJECTABLE LIDOCAINE.  Marland Kitchen Nitrofurantoin     REACTION: [place] REACTION: [place]  . Pregabalin     REACTION: felt bad REACTION: felt bad  . Propoxyphene N-Acetaminophen     REACTION: [place]  . Rofecoxib     unk unk   . Sulfamethoxazole     REACTION: unspecified  . Sulfonamide Derivatives     REACTION: unspecified  . Celecoxib Rash    REACTION: [place] REACTION: [place]  . Codeine Rash    REACTION: [place] REACTION: [place]  . Hydrocod Polst-Cpm Polst Er Rash  . Propoxyphene Rash  . Sulfa Antibiotics Rash    REACTION: unspecified REACTION: unspecified    Current Outpatient Medications on File Prior to Visit  Medication Sig Dispense Refill  . aspirin (ASPIRIN LOW DOSE) 81 MG tablet Take 81 mg by mouth daily.      . Blood Glucose Monitoring Suppl ([place]) w/Device KIT 1 Device by Does not apply route once. 1 kit 0  . brimonidine (ALPHAGAN) 0.2 % ophthalmic  solution     . brimonidine-timolol (COMBIGAN) 0.2-0.5 % ophthalmic solution Place 1 drop into both eyes every 12 (twelve) hours.    . Cholecalciferol (VITAMIN D-3) 1000 UNITS CAPS Take 1 capsule by mouth daily.    Marland Kitchen ezetimibe (ZETIA) 10 MG tablet Take 1 tablet (10 mg total) by mouth daily. 90 tablet 3  . glipiZIDE (GLUCOTROL)  5 MG tablet TAKE ONE TABLET BY MOUTH TWICE DAILY 60 tablet 0  . isosorbide mononitrate (IMDUR) 60 MG 24 hr tablet Take 1 tablet (60 mg total) by mouth daily. 90 tablet 3  . Latanoprostene Bunod (VYZULTA) 0.024 % SOLN Apply 1 drop to eye at bedtime.    Marland Kitchen losartan (COZAAR) 50 MG tablet Take 1 tablet (50 mg total) by mouth daily. 90 tablet 3  . metoprolol succinate (TOPROL-XL) 100 MG 24 hr tablet Take 1 tablet (100 mg total) by mouth every morning. Take with or immediately following a meal. 90 tablet 3  . nitroGLYCERIN (NITROSTAT) 0.4 MG SL tablet Place 1 tablet (0.4 mg total) under the tongue every 5 (five) minutes as needed for chest pain. 26 tablet 6  . ONE TOUCH LANCETS MISC 1 each by Does not apply route 2 (two) times daily.    . ONE TOUCH ULTRA TEST test strip TEST TWICE DAILY 100 each 10  . OVER THE COUNTER MEDICATION Take 1 tablet by mouth daily. Med name: VISION FORMULA    . pantoprazole (PROTONIX) 40 MG tablet Take 1 tablet (40 mg total) by mouth daily. 30 tablet 3  . trimethoprim (TRIMPEX) 100 MG tablet Take 100 mg by mouth 2 (two) times daily as needed (UTI).     . vitamin B-12 (CYANOCOBALAMIN) 1000 MCG tablet Take 1,000 mcg by mouth daily.    . [DISCONTINUED] loratadine (CLARITIN) 10 MG tablet Take 10 mg by mouth as needed.      No current facility-administered medications on file prior to visit.     BP 102/64 (BP Location: Left Arm, Patient Position: Sitting, Cuff Size: Large)   Pulse 68   Temp 97.9 F (36.6 C) (Oral)   Wt 171 lb (77.6 kg)   SpO2 96%   BMI 26.00 kg/m    Objective:   Physical Exam  Constitutional: She appears well-nourished.  Neck:  Neck supple.  Cardiovascular: Normal rate and regular rhythm.  Pulmonary/Chest: Effort normal and breath sounds normal.  Skin: Skin is warm and dry.  Psychiatric: She has a normal mood and affect.          Assessment & Plan:

## 2018-01-12 NOTE — Assessment & Plan Note (Signed)
Stable on current regimen, continue same.  Follows with cardiology semi-annually

## 2018-01-12 NOTE — Patient Instructions (Addendum)
Schedule a lab only appointment to return fasting for labs.  I'll be in touch once I receive your results.   Schedule a follow up visit in 6 months.  It was a pleasure to see you today!

## 2018-01-12 NOTE — Assessment & Plan Note (Signed)
Repeat lipids pending, she will return when fasting. Continue Zetia, discussed Fish Oil if needed.

## 2018-01-12 NOTE — Assessment & Plan Note (Addendum)
Following with urology, no use of Trimethoprim for one year. Continue cranberry daily.

## 2018-01-12 NOTE — Assessment & Plan Note (Signed)
Managed on pantoprazole 40 mg, overall doing well. Continue same given return of symptoms off of medication.

## 2018-01-16 ENCOUNTER — Other Ambulatory Visit (INDEPENDENT_AMBULATORY_CARE_PROVIDER_SITE_OTHER): Payer: Medicare Other

## 2018-01-16 DIAGNOSIS — E0821 Diabetes mellitus due to underlying condition with diabetic nephropathy: Secondary | ICD-10-CM

## 2018-01-16 DIAGNOSIS — E1121 Type 2 diabetes mellitus with diabetic nephropathy: Secondary | ICD-10-CM | POA: Diagnosis not present

## 2018-01-16 DIAGNOSIS — E782 Mixed hyperlipidemia: Secondary | ICD-10-CM | POA: Diagnosis not present

## 2018-01-16 LAB — LIPID PANEL
CHOL/HDL RATIO: 4
Cholesterol: 161 mg/dL (ref 0–200)
HDL: 39.5 mg/dL (ref >39.00)
LDL CALC: 91 mg/dL (ref 0–99)
NONHDL: 121.26
Triglycerides: 151 mg/dL — ABNORMAL HIGH (ref 0.0–149.0)
VLDL: 30.2 mg/dL (ref 0.0–40.0)

## 2018-01-16 LAB — COMPREHENSIVE METABOLIC PANEL
ALT: 41 U/L — ABNORMAL HIGH (ref 0–35)
AST: 36 U/L (ref 0–37)
Albumin: 4.2 g/dL (ref 3.5–5.2)
Alkaline Phosphatase: 56 U/L (ref 39–117)
BUN: 19 mg/dL (ref 6–23)
CHLORIDE: 102 meq/L (ref 96–112)
CO2: 29 meq/L (ref 19–32)
Calcium: 9.6 mg/dL (ref 8.4–10.5)
Creatinine, Ser: 1.12 mg/dL (ref 0.40–1.20)
GFR: 49.26 mL/min — ABNORMAL LOW (ref >60.00)
GLUCOSE: 142 mg/dL — AB (ref 70–99)
POTASSIUM: 5.1 meq/L (ref 3.5–5.1)
SODIUM: 139 meq/L (ref 135–145)
TOTAL PROTEIN: 7.4 g/dL (ref 6.0–8.3)
Total Bilirubin: 1 mg/dL (ref 0.2–1.2)

## 2018-01-16 LAB — HEMOGLOBIN A1C: HEMOGLOBIN A1C: 8.6 % — AB (ref 4.6–6.5)

## 2018-01-16 LAB — TSH: TSH: 1.65 u[IU]/mL (ref 0.35–4.50)

## 2018-01-18 ENCOUNTER — Other Ambulatory Visit: Payer: Self-pay | Admitting: Primary Care

## 2018-01-18 DIAGNOSIS — E0821 Diabetes mellitus due to underlying condition with diabetic nephropathy: Secondary | ICD-10-CM

## 2018-01-18 MED ORDER — GLIPIZIDE ER 10 MG PO TB24: 10.0000 mg | ORAL_TABLET | Freq: Every day | ORAL | 0 refills | Status: DC

## 2018-01-24 ENCOUNTER — Other Ambulatory Visit: Payer: Self-pay | Admitting: Primary Care

## 2018-01-26 ENCOUNTER — Ambulatory Visit: Payer: Medicare Other | Admitting: Podiatry

## 2018-02-03 ENCOUNTER — Other Ambulatory Visit: Payer: Self-pay | Admitting: Cardiovascular Disease

## 2018-02-06 ENCOUNTER — Other Ambulatory Visit: Payer: Self-pay | Admitting: Cardiovascular Disease

## 2018-02-14 ENCOUNTER — Ambulatory Visit: Payer: Medicare Other | Admitting: Primary Care

## 2018-02-15 ENCOUNTER — Ambulatory Visit: Payer: Medicare Other | Admitting: Primary Care

## 2018-02-15 VITALS — BP 120/76 | HR 61 | Temp 98.2°F | Ht 68.0 in | Wt 172.5 lb

## 2018-02-15 DIAGNOSIS — M25561 Pain in right knee: Secondary | ICD-10-CM | POA: Diagnosis not present

## 2018-02-15 DIAGNOSIS — N644 Mastodynia: Secondary | ICD-10-CM

## 2018-02-15 MED ORDER — NAPROXEN 500 MG PO TABS: 500.0000 mg | ORAL_TABLET | Freq: Two times a day (BID) | ORAL | 0 refills | Status: DC | PRN

## 2018-02-15 NOTE — Patient Instructions (Signed)
You can take Naproxen 500 mg tablets twice daily with food as needed for knee pain. Do not take Ibuprofen with this medication.   Purchase a knee sleeve/brace for support.  Continue working on knee stretching and exercises.   You will be contacted regarding your mammogram and ultrasound.  Please let us know if you have not been contacted within one week.   It was a pleasure to see you today!   Knee Exercises Ask your health care provider which exercises are safe for you. Do exercises exactly as told by your health care provider and adjust them as directed. It is normal to feel mild stretching, pulling, tightness, or discomfort as you do these exercises, but you should stop right away if you feel sudden pain or your pain gets worse.Do not begin these exercises until told by your health care provider. STRETCHING AND RANGE OF MOTION EXERCISES These exercises warm up your muscles and joints and improve the movement and flexibility of your knee. These exercises also help to relieve pain, numbness, and tingling. Exercise A: Knee Extension, Prone 1. Lie on your abdomen on a bed. 2. Place your left / right knee just beyond the edge of the surface so your knee is not on the bed. You can put a towel under your left / right thigh just above your knee for comfort. 3. Relax your leg muscles and allow gravity to straighten your knee. You should feel a stretch behind your left / right knee. 4. Hold this position for __________ seconds. 5. Scoot up so your knee is supported between repetitions. Repeat __________ times. Complete this stretch __________ times a day. Exercise B: Knee Flexion, Active  1. Lie on your back with both knees straight. If this causes back discomfort, bend your left / right knee so your foot is flat on the floor. 2. Slowly slide your left / right heel back toward your buttocks until you feel a gentle stretch in the front of your knee or thigh. 3. Hold this position for __________  seconds. 4. Slowly slide your left / right heel back to the starting position. Repeat __________ times. Complete this exercise __________ times a day. Exercise C: Quadriceps, Prone  1. Lie on your abdomen on a firm surface, such as a bed or padded floor. 2. Bend your left / right knee and hold your ankle. If you cannot reach your ankle or pant leg, loop a belt around your foot and grab the belt instead. 3. Gently pull your heel toward your buttocks. Your knee should not slide out to the side. You should feel a stretch in the front of your thigh and knee. 4. Hold this position for __________ seconds. Repeat __________ times. Complete this stretch __________ times a day. Exercise D: Hamstring, Supine 1. Lie on your back. 2. Loop a belt or towel over the ball of your left / right foot. The ball of your foot is on the walking surface, right under your toes. 3. Straighten your left / right knee and slowly pull on the belt to raise your leg until you feel a gentle stretch behind your knee. ? Do not let your left / right knee bend while you do this. ? Keep your other leg flat on the floor. 4. Hold this position for __________ seconds. Repeat __________ times. Complete this stretch __________ times a day. STRENGTHENING EXERCISES These exercises build strength and endurance in your knee. Endurance is the ability to use your muscles for a long time, even  after they get tired. Exercise E: Quadriceps, Isometric  1. Lie on your back with your left / right leg extended and your other knee bent. Put a rolled towel or small pillow under your knee if told by your health care provider. 2. Slowly tense the muscles in the front of your left / right thigh. You should see your kneecap slide up toward your hip or see increased dimpling just above the knee. This motion will push the back of the knee toward the floor. 3. For __________ seconds, keep the muscle as tight as you can without increasing your  pain. 4. Relax the muscles slowly and completely. Repeat __________ times. Complete this exercise __________ times a day. Exercise F: Straight Leg Raises - Quadriceps 1. Lie on your back with your left / right leg extended and your other knee bent. 2. Tense the muscles in the front of your left / right thigh. You should see your kneecap slide up or see increased dimpling just above the knee. Your thigh may even shake a bit. 3. Keep these muscles tight as you raise your leg 4-6 inches (10-15 cm) off the floor. Do not let your knee bend. 4. Hold this position for __________ seconds. 5. Keep these muscles tense as you lower your leg. 6. Relax your muscles slowly and completely after each repetition. Repeat __________ times. Complete this exercise __________ times a day. Exercise G: Hamstring, Isometric 1. Lie on your back on a firm surface. 2. Bend your left / right knee approximately __________ degrees. 3. Dig your left / right heel into the surface as if you are trying to pull it toward your buttocks. Tighten the muscles in the back of your thighs to dig as hard as you can without increasing any pain. 4. Hold this position for __________ seconds. 5. Release the tension gradually and allow your muscles to relax completely for __________ seconds after each repetition. Repeat __________ times. Complete this exercise __________ times a day. Exercise H: Hamstring Curls  If told by your health care provider, do this exercise while wearing ankle weights. Begin with __________ weights. Then increase the weight by 1 lb (0.5 kg) increments. Do not wear ankle weights that are more than __________. 1. Lie on your abdomen with your legs straight. 2. Bend your left / right knee as far as you can without feeling pain. Keep your hips flat against the floor. 3. Hold this position for __________ seconds. 4. Slowly lower your leg to the starting position.  Repeat __________ times. Complete this exercise  __________ times a day. Exercise I: Squats (Quadriceps) 1. Stand in front of a table, with your feet and knees pointing straight ahead. You may rest your hands on the table for balance but not for support. 2. Slowly bend your knees and lower your hips like you are going to sit in a chair. ? Keep your weight over your heels, not over your toes. ? Keep your lower legs upright so they are parallel with the table legs. ? Do not let your hips go lower than your knees. ? Do not bend lower than told by your health care provider. ? If your knee pain increases, do not bend as low. 3. Hold the squat position for __________ seconds. 4. Slowly push with your legs to return to standing. Do not use your hands to pull yourself to standing. Repeat __________ times. Complete this exercise __________ times a day. Exercise J: Wall Slides (Quadriceps)  1. Lean your back against  a smooth wall or door while you walk your feet out 18-24 inches (46-61 cm) from it. 2. Place your feet hip-width apart. 3. Slowly slide down the wall or door until your knees bend __________ degrees. Keep your knees over your heels, not over your toes. Keep your knees in line with your hips. 4. Hold for __________ seconds. Repeat __________ times. Complete this exercise __________ times a day. Exercise K: Straight Leg Raises - Hip Abductors 1. Lie on your side with your left / right leg in the top position. Lie so your head, shoulder, knee, and hip line up. You may bend your bottom knee to help you keep your balance. 2. Roll your hips slightly forward so your hips are stacked directly over each other and your left / right knee is facing forward. 3. Leading with your heel, lift your top leg 4-6 inches (10-15 cm). You should feel the muscles in your outer hip lifting. ? Do not let your foot drift forward. ? Do not let your knee roll toward the ceiling. 4. Hold this position for __________ seconds. 5. Slowly return your leg to the starting  position. 6. Let your muscles relax completely after each repetition. Repeat __________ times. Complete this exercise __________ times a day. Exercise L: Straight Leg Raises - Hip Extensors 1. Lie on your abdomen on a firm surface. You can put a pillow under your hips if that is more comfortable. 2. Tense the muscles in your buttocks and lift your left / right leg about 4-6 inches (10-15 cm). Keep your knee straight as you lift your leg. 3. Hold this position for __________ seconds. 4. Slowly lower your leg to the starting position. 5. Let your leg relax completely after each repetition. Repeat __________ times. Complete this exercise __________ times a day. This information is not intended to replace advice given to you by your health care provider. Make sure you discuss any questions you have with your health care provider. Document Released: 09/29/2005 Document Revised: 08/09/2016 Document Reviewed: 09/21/2015 Elsevier Interactive Patient Education  2018 [employer].

## 2018-02-15 NOTE — Progress Notes (Signed)
Subjective:    Patient ID: [name] [name], female    DOB: 1932/10/11, 84 y.o.   MRN: [id number]  HPI  [name] [name] is an 84 year old female with a history of non hodgkin's lymphoma, breast cysts, arthralgias who presents today with multiple complaints.  1) Breast Pain: Her pain is located to the right lateral chest with radiation to her right breast at 9 o'clock position. She denies changes in skin texture, masses, rashes. The pain has been present for the past one week. She had a skin biopsy by her dermatologist to the right lateral chest several years ago. Her mammogram is due in October this year.  2) Knee Pain: Located to her right knee that began 5 days ago after raising from a seated position. During that time she was unable to move due to the pain. She has pain with ambulation and when crossing her right leg over her left. Her pain is located to the right medial side with radiation to the popliteal fossa. She's been taking Ibuprofen and applying heat and Deep Blue Rub with some improvement. She's also been doing some stretching exercises at home.   Review of Systems  Respiratory: Negative for cough and shortness of breath.   Cardiovascular:       Right breast pain  Musculoskeletal: Positive for arthralgias.  Skin: Negative for color change, rash and wound.  Neurological: Negative for numbness.       Past Medical History:  Diagnosis Date  . Allergy    Cipro, Plavix, Statins  . CAD (coronary artery disease)    a. s/p tandem Promus DES to LAD in 2009 by Dr. Olevia Perches;  b.  LHC (03/22/14):  prox LAD 30%, mid LAD 40%, LAD stent ok with dist 20% ISR, apical LAD occluded with L-L collats filling apical vessel (too small for PCI), mid RCA 20%, EF 70%.  Med Rx  . Diabetes mellitus    Diagnosed 2012  . Diverticulosis of colon (without mention of hemorrhage)   . GERD (gastroesophageal reflux disease)   . Hx of cardiovascular stress test    a. Nuclear (08/2013):  No ischemia, EF 80%, Normal  .  Hx of echocardiogram    a. Echo (07/2013):  Mild LVH, vigorous LVF, EF 65-70%, Gr 1 DD, mild MR, mild to mod LAE  . Hyperlipidemia    intol of statins  . Hypertension   . NHL (non-Hodgkin's lymphoma) ([place]) dx'd 2003   chemo/xrt comp 2003  . Personal history of colonic polyps   . Proctitis   . Urticaria      Social History   Socioeconomic History  . Marital status: Married    Spouse name: Not on file  . Number of children: Not on file  . Years of education: Not on file  . Highest education level: Not on file  Social Needs  . Financial resource strain: Not on file  . Food insecurity - worry: Not on file  . Food insecurity - inability: Not on file  . Transportation needs - medical: Not on file  . Transportation needs - non-medical: Not on file  Occupational History  . Occupation: Retired    Fish farm manager: RETIRED  Tobacco Use  . Smoking status: Never Smoker  . Smokeless tobacco: Never Used  Substance and Sexual Activity  . Alcohol use: No  . Drug use: No  . Sexual activity: No  Other Topics Concern  . Not on file  Social History Narrative  . Not on file  Past Surgical History:  Procedure Laterality Date  . cardiac cath-neg    . CORONARY ANGIOPLASTY WITH STENT PLACEMENT     x 2  . dexa-neg    . KNEE SURGERY  10/2003   left  . laser surgery for cataracts-left    . LEFT HEART CATHETERIZATION WITH CORONARY ANGIOGRAM N/A 03/22/2014   Procedure: LEFT HEART CATHETERIZATION WITH CORONARY ANGIOGRAM;  Surgeon: Burnell Blanks, MD;  Location: [facility] C[facility] LAB;  Service: Cardiovascular;  Laterality: N/A;  . stress cardiolite    . VAGINAL HYSTERECTOMY     partial, fibroids, one ovary left    Family History  Problem Relation Age of Onset  . Cancer Mother        jaw  . Hypertension Father   . Heart attack Father   . Heart attack Sister   . Colon cancer Neg Hx     Allergies  Allergen Reactions  . Other Rash and Anaphylaxis  . Shellfish Allergy Anaphylaxis and Rash  .  Cephalexin     REACTION: trash and swelling REACTION: trash and swelling  . Ciprofloxacin     REACTION: u/k  . Clopidogrel Bisulfate     REACTION: swelling, rash  . Codeine Phosphate     REACTION: unspecified  . Ezetimibe-Simvastatin     REACTION: myalgias REACTION: myalgias  . Hydrocod Polst-Cpm Polst Er     REACTION: u/k  . Lidocaine Hives    ONLY TO ADHESIVE LIDOCAINE PATCHES. NO PROBLEM WITH INJECTABLE LIDOCAINE.  Marland Kitchen Nitrofurantoin     REACTION: [place] REACTION: [place]  . Pregabalin     REACTION: felt bad REACTION: felt bad  . Propoxyphene N-Acetaminophen     REACTION: [place]  . Rofecoxib     unk unk   . Sulfamethoxazole     REACTION: unspecified  . Sulfonamide Derivatives     REACTION: unspecified  . Celecoxib Rash    REACTION: [place] REACTION: [place]  . Codeine Rash    REACTION: [place] REACTION: [place]  . Hydrocod Polst-Cpm Polst Er Rash  . Propoxyphene Rash  . Sulfa Antibiotics Rash    REACTION: unspecified REACTION: unspecified    Current Outpatient Medications on File Prior to Visit  Medication Sig Dispense Refill  . aspirin (ASPIRIN LOW DOSE) 81 MG tablet Take 81 mg by mouth daily.      . Blood Glucose Monitoring Suppl ([place]) w/Device KIT 1 Device by Does not apply route once. 1 kit 0  . brimonidine-timolol (COMBIGAN) 0.2-0.5 % ophthalmic solution Place 1 drop into both eyes every 12 (twelve) hours.    . Cholecalciferol (VITAMIN D-3) 1000 UNITS CAPS Take 1 capsule by mouth daily.    Marland Kitchen ezetimibe (ZETIA) 10 MG tablet TAKE ONE TABLET BY MOUTH EVERY DAY 90 tablet 2  . glipiZIDE (GLUCOTROL XL) 10 MG 24 hr tablet Take 1 tablet (10 mg total) by mouth daily with breakfast. 90 tablet 0  . isosorbide mononitrate (IMDUR) 60 MG 24 hr tablet Take 1 tablet (60 mg total) by mouth daily. 90 tablet 3  . Latanoprostene Bunod (VYZULTA) 0.024 % SOLN Apply 1 drop to eye at bedtime.    Marland Kitchen losartan (COZAAR) 50 MG tablet TAKE ONE TABLET BY MOUTH EVERY DAY 90 tablet 2  . metoprolol  succinate (TOPROL-XL) 100 MG 24 hr tablet TAKE ONE TABLET BY MOUTH EVERY MORNING WITH OR IMMEDIATELY FOLLOWING MEAL 90 tablet 0  . nitroGLYCERIN (NITROSTAT) 0.4 MG SL tablet Place 1 tablet (0.4 mg total) under the tongue every 5 (  five) minutes as needed for chest pain. 26 tablet 6  . ONE TOUCH LANCETS MISC 1 each by Does not apply route 2 (two) times daily.    . ONE TOUCH ULTRA TEST test strip TEST TWICE DAILY 100 each 10  . OVER THE COUNTER MEDICATION Take 1 tablet by mouth daily. Med name: VISION FORMULA    . pantoprazole (PROTONIX) 40 MG tablet Take 1 tablet (40 mg total) by mouth daily. 30 tablet 3  . trimethoprim (TRIMPEX) 100 MG tablet Take 100 mg by mouth 2 (two) times daily as needed (UTI).     . vitamin B-12 (CYANOCOBALAMIN) 1000 MCG tablet Take 1,000 mcg by mouth daily.    . [DISCONTINUED] loratadine (CLARITIN) 10 MG tablet Take 10 mg by mouth as needed.      No current facility-administered medications on file prior to visit.     BP 120/76   Pulse 61   Temp 98.2 F (36.8 C) (Oral)   Ht _0  (1.727 m)   Wt 172 lb 8 oz (78.2 kg)   SpO2 95%   BMI 26.23 kg/m    Objective:   Physical Exam  Constitutional: She appears well-nourished.  Neck: Neck supple.  Cardiovascular: Normal rate and regular rhythm.  Pulmonary/Chest: Effort normal and breath sounds normal. Right breast exhibits no mass, no skin change and no tenderness. Left breast exhibits no mass, no skin change and no tenderness.    Breast cyst noted to left breast at 9 o'clock position  Musculoskeletal:       Right knee: She exhibits normal range of motion, no erythema and no bony tenderness. No tenderness found.       Legs: 5/5 strength to bilateral lower extremities  Skin: Skin is warm and dry. No rash noted.          Assessment & Plan:  Breast Pain:  Located to right lateral chest with radiation to right breast. No evidence of shingles. No obvious new masses or changes to skin texture. Orders placed for  diagnostic mammogram with bilateral ultrasounds. Consider MSK vs nerve involvement as well. Rx for Naproxen 500 mg course sent to pharmacy.  Acute knee Pain:  Located to right knee x 5 days. HPI and exam suggest arthritic flare. Rx for Naproxen course sent to pharmacy. Discussed knee brace/sleeve.  Handout provided for home exercises.  Consider PT vs Sports Med eval if no improvement.  Pleas Koch, NP

## 2018-02-18 ENCOUNTER — Other Ambulatory Visit: Payer: Self-pay | Admitting: Cardiovascular Disease

## 2018-02-22 ENCOUNTER — Other Ambulatory Visit: Payer: Self-pay

## 2018-02-22 ENCOUNTER — Encounter: Payer: Self-pay | Admitting: Family Medicine

## 2018-02-22 ENCOUNTER — Ambulatory Visit: Payer: Medicare Other | Admitting: Family Medicine

## 2018-02-22 VITALS — BP 140/72 | HR 56 | Temp 98.3°F | Ht 68.0 in | Wt 174.0 lb

## 2018-02-22 DIAGNOSIS — M25471 Effusion, right ankle: Secondary | ICD-10-CM | POA: Diagnosis not present

## 2018-02-22 DIAGNOSIS — M23306 Other meniscus derangements, unspecified meniscus, right knee: Secondary | ICD-10-CM | POA: Diagnosis not present

## 2018-02-22 DIAGNOSIS — M1711 Unilateral primary osteoarthritis, right knee: Secondary | ICD-10-CM

## 2018-02-22 NOTE — Progress Notes (Signed)
Dr. Frederico Hamman T. Chetara Kropp, MD, [facility] [place], [address] Phone: [phone] Fax: [phone]  02/22/2018  Patient: [name] [name], MRN: [id number], DOB: 23-Oct-1932, 84 y.o.  Primary Physician:  Pleas Koch, NP   Chief Complaint  Patient presents with  . Knee Pain    Right-behind knee  . Foot Swelling    Right   Subjective:   [name] [name] is a 84 y.o. very pleasant female patient who presents with the following:  Very well-known patient aged 7 who presents with right-sided knee pain over the last few days.  She has had some mild swelling and pain on the medial aspect as well as the posterior aspect of her knee.  Yesterday she had a difficult time even walking, but this is improved compared to yesterday.  Going up and down stairs has been okay.  She did have a knee brace that she placed on this, but this was actually a prior knee brace for her left knee that was hinged in of course did not fit correctly.  Husband, when stood up has had a lot of pain in her r knee. Has taken some NSAIDS.  Yesterday could hardly walk on it. Wore a brace with stays. Up and down the stairs without problems.   Did have some ankle swelling on the r  Past Medical History, Surgical History, Social History, Family History, Problem List, Medications, and Allergies have been reviewed and updated if relevant.  Patient Active Problem List   Diagnosis Date Noted  . Recurrent UTI 01/12/2018  . Acute non-recurrent pansinusitis 12/26/2017  . Neck pain 09/27/2017  . Insomnia 09/27/2017  . Situational insomnia 01/26/2017  . Stress due to illness of family member 05/04/2016  . Pain in joints of both feet 01/14/2016  . Osteopenia 08/06/2015  . Constipation 12/30/2014  . B12 deficiency 12/30/2014  . Allergic reaction 09/02/2014  . Arthralgia of right foot 07/18/2014  . Pruritic intertrigo 07/18/2014  . Right hip pain 06/04/2014  . Other  malaise and fatigue 06/04/2014  . Elevated LFTs 04/19/2014  . Mitral regurgitation 04/19/2014  . Pes planus 06/13/2013  . Renal insufficiency 06/07/2011  . Type 2 diabetes mellitus ([place]) 05/31/2011  . MIXED HYPERLIPIDEMIA 08/04/2010  . LUMBAR SPRAIN AND STRAIN 01/15/2010  . 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 02/25/2009  . EXTERNAL HEMORRHOIDS 02/24/2009  . DIVERTICULOSIS, COLON 02/24/2009  . LYMPHOMA, MALT 08/28/2007  . DETACHED RETINA 08/28/2007  . Essential hypertension 08/28/2007  . Coronary atherosclerosis 08/28/2007  . MITRAL VALVE PROLAPSE 08/28/2007  . GERD 08/28/2007  . IBS 08/28/2007  . BREAST CYSTS, BILATERAL 08/28/2007    Past Medical History:  Diagnosis Date  . Allergy    Cipro, Plavix, Statins  . CAD (coronary artery disease)    a. s/p tandem Promus DES to LAD in 2009 by Dr. Olevia Perches;  b.  LHC (03/22/14):  prox LAD 30%, mid LAD 40%, LAD stent ok with dist 20% ISR, apical LAD occluded with L-L collats filling apical vessel (too small for PCI), mid RCA 20%, EF 70%.  Med Rx  . Diabetes mellitus    Diagnosed 2012  . Diverticulosis of colon (without mention of hemorrhage)   . GERD (gastroesophageal reflux disease)   . Hx of cardiovascular stress test    a. Nuclear (08/2013):  No ischemia, EF 80%, Normal  . Hx of echocardiogram    a. Echo (07/2013):  Mild LVH, vigorous LVF, EF  65-70%, Gr 1 DD, mild MR, mild to mod LAE  . Hyperlipidemia    intol of statins  . Hypertension   . NHL (non-Hodgkin's lymphoma) ([place]) dx'd 2003   chemo/xrt comp 2003  . Personal history of colonic polyps   . Proctitis   . Urticaria     Past Surgical History:  Procedure Laterality Date  . cardiac cath-neg    . CORONARY ANGIOPLASTY WITH STENT PLACEMENT     x 2  . dexa-neg    . KNEE SURGERY  10/2003   left  . laser surgery for cataracts-left    . LEFT HEART CATHETERIZATION WITH CORONARY ANGIOGRAM N/A 03/22/2014   Procedure: LEFT HEART CATHETERIZATION WITH CORONARY ANGIOGRAM;   Surgeon: Burnell Blanks, MD;  Location: [facility] CATH LAB;  Service: Cardiovascular;  Laterality: N/A;  . stress cardiolite    . VAGINAL HYSTERECTOMY     partial, fibroids, one ovary left    Social History   Socioeconomic History  . Marital status: Married    Spouse name: Not on file  . Number of children: Not on file  . Years of education: Not on file  . Highest education level: Not on file  Occupational History  . Occupation: Retired    Fish farm manager: RETIRED  Social Needs  . Financial resource strain: Not on file  . Food insecurity:    Worry: Not on file    Inability: Not on file  . Transportation needs:    Medical: Not on file    Non-medical: Not on file  Tobacco Use  . Smoking status: Never Smoker  . Smokeless tobacco: Never Used  Substance and Sexual Activity  . Alcohol use: No  . Drug use: No  . Sexual activity: Never  Lifestyle  . Physical activity:    Days per week: Not on file    Minutes per session: Not on file  . Stress: Not on file  Relationships  . Social connections:    Talks on phone: Not on file    Gets together: Not on file    Attends religious service: Not on file    Active member of club or organization: Not on file    Attends meetings of clubs or organizations: Not on file    Relationship status: Not on file  . Intimate partner violence:    Fear of current or ex partner: Not on file    Emotionally abused: Not on file    Physically abused: Not on file    Forced sexual activity: Not on file  Other Topics Concern  . Not on file  Social History Narrative  . Not on file    Family History  Problem Relation Age of Onset  . Cancer Mother        jaw  . Hypertension Father   . Heart attack Father   . Heart attack Sister   . Colon cancer Neg Hx     Allergies  Allergen Reactions  . Other Rash and Anaphylaxis  . Shellfish Allergy Anaphylaxis and Rash  . Cephalexin     REACTION: trash and swelling REACTION: trash and swelling  .  Ciprofloxacin     REACTION: u/k  . Clopidogrel Bisulfate     REACTION: swelling, rash  . Codeine Phosphate     REACTION: unspecified  . Ezetimibe-Simvastatin     REACTION: myalgias REACTION: myalgias  . Hydrocod Polst-Cpm Polst Er     REACTION: u/k  . Lidocaine Hives    ONLY TO ADHESIVE LIDOCAINE  PATCHES. NO PROBLEM WITH INJECTABLE LIDOCAINE.  Marland Kitchen Nitrofurantoin     REACTION: [place] REACTION: [place]  . Pregabalin     REACTION: felt bad REACTION: felt bad  . Propoxyphene N-Acetaminophen     REACTION: [place]  . Rofecoxib     unk unk   . Sulfamethoxazole     REACTION: unspecified  . Sulfonamide Derivatives     REACTION: unspecified  . Celecoxib Rash    REACTION: [place] REACTION: [place]  . Codeine Rash    REACTION: [place] REACTION: [place]  . Hydrocod Polst-Cpm Polst Er Rash  . Propoxyphene Rash  . Sulfa Antibiotics Rash    REACTION: unspecified REACTION: unspecified    Medication list reviewed and updated in full in [place].  GEN: No fevers, chills. Nontoxic. Primarily MSK c/o today. MSK: Detailed in the HPI GI: tolerating PO intake without difficulty Neuro: No numbness, parasthesias, or tingling associated. Otherwise the pertinent positives of the ROS are noted above.   Objective:   BP 140/72   Pulse (!) 56   Temp 98.3 F (36.8 C) (Oral)   Ht 5' 8"  (1.727 m)   Wt 174 lb (78.9 kg)   BMI 26.46 kg/m    GEN: WDWN, NAD, Non-toxic, Alert & Oriented x 3 HEENT: Atraumatic, Normocephalic.  Ears and Nose: No external deformity. EXTR: No clubbing/cyanosis/edema NEURO: Normal gait.  PSYCH: Normally interactive. Conversant. Not depressed or anxious appearing.  Calm demeanor.   Knee:  R Gait: Normal heel toe pattern, antalgia ROM: 0-115 Effusion: neg Echymosis or edema: none Patellar tendon NT Painful PLICA: neg Patellar grind: negative Medial and lateral patellar facet loading: negative medial and lateral joint lines: ttp throughout medial joint line Mcmurray's + for  pain Flexion-pinch + Varus and valgus stress: stable Lachman: neg Ant and Post drawer: neg Hip abduction, IR, ER: WNL Hip flexion str: 5/5 Hip abd: 5/5 Quad: 5/5 VMO atrophy:No Hamstring concentric and eccentric: 5/5   Radiology: No results found.   Assessment and Plan:   Degenerative tear of meniscus, right  Primary osteoarthritis of right knee  Edema of right ankle  >25 minutes spent in face to face time with patient, >50% spent in counselling or coordination of care   Degenerative meniscal tear likely based on exam.  Age 61.  10-15 degrees difference in flexion compared to the left side.  She is very active, and I am and I have her go back to doing her regular basic exercise, which I think will help rehab her knee.  Hold off on any further medications or interventions given her preferences and approximate 20 allergies.  I reassured her about her ankle, and it is really not swollen at all right now and not tender at all.  I placed her in a patellar J brace for support when she is up and walking and moving about.  She is going to follow-up with me in 4-6 weeks.  If she is doing well, then I do not think she really needs to come back and see me.  Signed,  Maud Deed. [name], MD   Allergies as of 02/22/2018      Reactions   Other Rash, Anaphylaxis   Shellfish Allergy Anaphylaxis, Rash   Cephalexin    REACTION: trash and swelling REACTION: trash and swelling   Ciprofloxacin    REACTION: u/k   Clopidogrel Bisulfate    REACTION: swelling, rash   Codeine Phosphate    REACTION: unspecified   Ezetimibe-simvastatin    REACTION: myalgias REACTION: myalgias  Hydrocod Polst-cpm Polst Er    REACTION: u/k   Lidocaine Hives   ONLY TO ADHESIVE LIDOCAINE PATCHES. NO PROBLEM WITH INJECTABLE LIDOCAINE.   Nitrofurantoin    REACTION: [place] REACTION: [place]   Pregabalin    REACTION: felt bad REACTION: felt bad   Propoxyphene N-acetaminophen    REACTION: [place]   Rofecoxib     unk unk   Sulfamethoxazole    REACTION: unspecified   Sulfonamide Derivatives    REACTION: unspecified   Celecoxib Rash   REACTION: [place] REACTION: [place]   Codeine Rash   REACTION: [place] REACTION: [place]   Hydrocod Polst-cpm Polst Er Rash   Propoxyphene Rash   Sulfa Antibiotics Rash   REACTION: unspecified REACTION: unspecified      Medication List        Accurate as of 02/22/18 11:59 PM. Always use your most recent med list.          ASPIRIN LOW DOSE 81 MG tablet Generic drug:  aspirin Take 81 mg by mouth daily.   COMBIGAN 0.2-0.5 % ophthalmic solution Generic drug:  brimonidine-timolol Place 1 drop into both eyes every 12 (twelve) hours.   ezetimibe 10 MG tablet Commonly known as:  ZETIA TAKE ONE TABLET BY MOUTH EVERY DAY   glipiZIDE 10 MG 24 hr tablet Commonly known as:  GLUCOTROL XL Take 1 tablet (10 mg total) by mouth daily with breakfast.   isosorbide mononitrate 60 MG 24 hr tablet Commonly known as:  IMDUR TAKE ONE TABLET BY MOUTH EVERY DAY   losartan 50 MG tablet Commonly known as:  COZAAR TAKE ONE TABLET BY MOUTH EVERY DAY   metoprolol succinate 100 MG 24 hr tablet Commonly known as:  TOPROL-XL TAKE ONE TABLET BY MOUTH EVERY MORNING WITH OR IMMEDIATELY FOLLOWING MEAL   naproxen 500 MG tablet Commonly known as:  NAPROSYN Take 1 tablet (500 mg total) by mouth 2 (two) times daily as needed for moderate pain.   nitroGLYCERIN 0.4 MG SL tablet Commonly known as:  NITROSTAT Place 1 tablet (0.4 mg total) under the tongue every 5 (five) minutes as needed for chest pain.   ONE TOUCH LANCETS Misc 1 each by Does not apply route 2 (two) times daily.   ONE TOUCH ULTRA TEST test strip Generic drug:  glucose blood TEST TWICE DAILY   ONETOUCH VERIO FLEX SYSTEM w/Device Kit 1 Device by Does not apply route once.   OVER THE COUNTER MEDICATION Take 1 tablet by mouth daily. Med name: VISION FORMULA   pantoprazole 40 MG tablet Commonly known as:  PROTONIX Take 1  tablet (40 mg total) by mouth daily.   timolol 0.5 % ophthalmic solution Commonly known as:  TIMOPTIC   trimethoprim 100 MG tablet Commonly known as:  TRIMPEX Take 100 mg by mouth 2 (two) times daily as needed (UTI).   vitamin B-12 1000 MCG tablet Commonly known as:  CYANOCOBALAMIN Take 1,000 mcg by mouth daily.   Vitamin D-3 1000 units Caps Take 1 capsule by mouth daily.

## 2018-02-28 ENCOUNTER — Encounter: Payer: Self-pay | Admitting: Primary Care

## 2018-03-21 ENCOUNTER — Ambulatory Visit (INDEPENDENT_AMBULATORY_CARE_PROVIDER_SITE_OTHER): Payer: Medicare Other

## 2018-03-21 ENCOUNTER — Ambulatory Visit: Payer: Medicare Other | Admitting: Podiatry

## 2018-03-21 ENCOUNTER — Encounter: Payer: Self-pay | Admitting: Podiatry

## 2018-03-21 DIAGNOSIS — M779 Enthesopathy, unspecified: Secondary | ICD-10-CM

## 2018-03-21 DIAGNOSIS — M659 Synovitis and tenosynovitis, unspecified: Secondary | ICD-10-CM | POA: Diagnosis not present

## 2018-03-23 NOTE — Progress Notes (Signed)
   Subjective:  84 year old female with PMHx of T2DM presenting today with a chief complaint of intermittent lateral right ankle pain that began about one week ago. She reports associated swelling. She states the swelling is worse at the end of the day. Walking and bearing weight increase the pain. She has not done anything for treatment. She is not currently on any diuretics. Patient is here for further evaluation and treatment.   Past Medical History:  Diagnosis Date  . Allergy    Cipro, Plavix, Statins  . CAD (coronary artery disease)    a. s/p tandem Promus DES to LAD in 2009 by Dr. Olevia Perches;  b.  LHC (03/22/14):  prox LAD 30%, mid LAD 40%, LAD stent ok with dist 20% ISR, apical LAD occluded with L-L collats filling apical vessel (too small for PCI), mid RCA 20%, EF 70%.  Med Rx  . Diabetes mellitus    Diagnosed 2012  . Diverticulosis of colon (without mention of hemorrhage)   . GERD (gastroesophageal reflux disease)   . Hx of cardiovascular stress test    a. Nuclear (08/2013):  No ischemia, EF 80%, Normal  . Hx of echocardiogram    a. Echo (07/2013):  Mild LVH, vigorous LVF, EF 65-70%, Gr 1 DD, mild MR, mild to mod LAE  . Hyperlipidemia    intol of statins  . Hypertension   . NHL (non-Hodgkin's lymphoma) ([place]) dx'd 2003   chemo/xrt comp 2003  . Personal history of colonic polyps   . Proctitis   . Urticaria      Objective / Physical Exam:  General:  The patient is alert and oriented x3 in no acute distress. Dermatology:  Skin is warm, dry and supple bilateral lower extremities. Negative for open lesions or macerations. Vascular:  Palpable pedal pulses bilaterally. No edema or erythema noted. Capillary refill within normal limits. Neurological:  Epicritic and protective threshold grossly intact bilaterally.  Musculoskeletal Exam:  Pain on palpation to the anterior lateral medial aspects of the patient's right ankle. Mild edema noted. Range of motion within normal limits to all  pedal and ankle joints bilateral. Muscle strength 5/5 in all groups bilateral.   Radiographic Exam:  Normal osseous mineralization. Joint spaces preserved. No fracture/dislocation/boney destruction.    Assessment: 1. pain in right ankle 2. synovitis of right ankle  Plan of Care:  1. Patient was evaluated. X-Rays reviewed.  2. injection of 0.5 mL Celestone Soluspan injected in the patient's right ankle. 3. Compression anklet dispensed.  4. Return to clinic at next routine nail care appointment.   Edrick Kins, DPM [facility]  Dr. Edrick Kins, [address]                                        [place], [place] [address]                Office [phone]  Fax [phone]

## 2018-03-27 ENCOUNTER — Other Ambulatory Visit: Payer: Self-pay

## 2018-03-27 ENCOUNTER — Ambulatory Visit: Payer: Medicare Other | Admitting: Family Medicine

## 2018-03-27 ENCOUNTER — Encounter: Payer: Self-pay | Admitting: Family Medicine

## 2018-03-27 VITALS — BP 100/60 | HR 65 | Temp 98.5°F | Ht 68.0 in | Wt 172.2 lb

## 2018-03-27 DIAGNOSIS — J040 Acute laryngitis: Secondary | ICD-10-CM | POA: Diagnosis not present

## 2018-03-27 DIAGNOSIS — J069 Acute upper respiratory infection, unspecified: Secondary | ICD-10-CM

## 2018-03-27 MED ORDER — CYCLOBENZAPRINE HCL 5 MG PO TABS: 5.0000 mg | ORAL_TABLET | Freq: Every day | ORAL | 0 refills | Status: DC

## 2018-03-27 NOTE — Patient Instructions (Signed)
Over the counter Afrin

## 2018-03-27 NOTE — Progress Notes (Signed)
Dr. Frederico Hamman T. Caragh Gasper, MD, [facility] [place], [address] Phone: [phone] Fax: [phone]  03/27/2018  Patient: [name] [name], MRN: [id number], DOB: [birth year], 84 y.o.  Primary Physician:  Pleas Koch, NP   Chief Complaint  Patient presents with  . Cough  . Hoarse  . Nausea  . Sore Throat    Scrathcy   Subjective:   This 84 y.o. female patient presents with runny nose, sneezing, cough, sore throat, malaise and minimal / low-grade fever .  Saturday, lost her voice and took some Coricidin HBP and delsym cough medication. Will not sleep at night - up much of the night last night.   ? recent exposure to others with similar symptoms.   The patent denies sore throat as the primary complaint. Denies sthortness of breath/wheezing, high fever, chest pain, rhinits for more than 14 days, significant myalgia, otalgia, facial pain, abdominal pain, changes in bowel or bladder.  PMH, PHS, Allergies, Problem List, Medications, Family History, and Social History have all been reviewed.  Patient Active Problem List   Diagnosis Date Noted  . Recurrent UTI 01/12/2018  . Acute non-recurrent pansinusitis 12/26/2017  . Neck pain 09/27/2017  . Insomnia 09/27/2017  . Situational insomnia 01/26/2017  . Stress due to illness of family member 05/04/2016  . Pain in joints of both feet 01/14/2016  . Osteopenia 08/06/2015  . Constipation 12/30/2014  . B12 deficiency 12/30/2014  . Allergic reaction 09/02/2014  . Arthralgia of right foot 07/18/2014  . Pruritic intertrigo 07/18/2014  . Right hip pain 06/04/2014  . Other malaise and fatigue 06/04/2014  . Elevated LFTs 04/19/2014  . Mitral regurgitation 04/19/2014  . Pes planus 06/13/2013  . Renal insufficiency 06/07/2011  . Type 2 diabetes mellitus ([place]) 05/31/2011  . MIXED HYPERLIPIDEMIA 08/04/2010  . LUMBAR SPRAIN AND STRAIN 01/15/2010  . MZ LYMPHOMA UNS SITE  EXTRANODAL&SOLID ORGAN SITE 02/25/2009  . EXTERNAL HEMORRHOIDS 02/24/2009  . DIVERTICULOSIS, COLON 02/24/2009  . LYMPHOMA, MALT 08/28/2007  . DETACHED RETINA 08/28/2007  . Essential hypertension 08/28/2007  . Coronary atherosclerosis 08/28/2007  . MITRAL VALVE PROLAPSE 08/28/2007  . GERD 08/28/2007  . IBS 08/28/2007  . BREAST CYSTS, BILATERAL 08/28/2007    Past Medical History:  Diagnosis Date  . Allergy    Cipro, Plavix, Statins  . CAD (coronary artery disease)    a. s/p tandem Promus DES to LAD in 2009 by Dr. Olevia Perches;  b.  LHC (03/22/14):  prox LAD 30%, mid LAD 40%, LAD stent ok with dist 20% ISR, apical LAD occluded with L-L collats filling apical vessel (too small for PCI), mid RCA 20%, EF 70%.  Med Rx  . Diabetes mellitus    Diagnosed 2012  . Diverticulosis of colon (without mention of hemorrhage)   . GERD (gastroesophageal reflux disease)   . Hx of cardiovascular stress test    a. Nuclear (08/2013):  No ischemia, EF 80%, Normal  . Hx of echocardiogram    a. Echo (07/2013):  Mild LVH, vigorous LVF, EF 65-70%, Gr 1 DD, mild MR, mild to mod LAE  . Hyperlipidemia    intol of statins  . Hypertension   . NHL (non-Hodgkin's lymphoma) ([place]) dx'd 2003   chemo/xrt comp 2003  . Personal history of colonic polyps   . Proctitis   . Urticaria     Past Surgical History:  Procedure Laterality Date  . cardiac cath-neg    . CORONARY ANGIOPLASTY WITH STENT  PLACEMENT     x 2  . dexa-neg    . KNEE SURGERY  10/2003   left  . laser surgery for cataracts-left    . LEFT HEART CATHETERIZATION WITH CORONARY ANGIOGRAM N/A 03/22/2014   Procedure: LEFT HEART CATHETERIZATION WITH CORONARY ANGIOGRAM;  Surgeon: Burnell Blanks, MD;  Location: [facility] CATH LAB;  Service: Cardiovascular;  Laterality: N/A;  . stress cardiolite    . VAGINAL HYSTERECTOMY     partial, fibroids, one ovary left    Social History   Socioeconomic History  . Marital status: Married    Spouse name: Not on file  .  Number of children: Not on file  . Years of education: Not on file  . Highest education level: Not on file  Occupational History  . Occupation: Retired    Fish farm manager: RETIRED  Social Needs  . Financial resource strain: Not on file  . Food insecurity:    Worry: Not on file    Inability: Not on file  . Transportation needs:    Medical: Not on file    Non-medical: Not on file  Tobacco Use  . Smoking status: Never Smoker  . Smokeless tobacco: Never Used  Substance and Sexual Activity  . Alcohol use: No  . Drug use: No  . Sexual activity: Never  Lifestyle  . Physical activity:    Days per week: Not on file    Minutes per session: Not on file  . Stress: Not on file  Relationships  . Social connections:    Talks on phone: Not on file    Gets together: Not on file    Attends religious service: Not on file    Active member of club or organization: Not on file    Attends meetings of clubs or organizations: Not on file    Relationship status: Not on file  . Intimate partner violence:    Fear of current or ex partner: Not on file    Emotionally abused: Not on file    Physically abused: Not on file    Forced sexual activity: Not on file  Other Topics Concern  . Not on file  Social History Narrative  . Not on file    Family History  Problem Relation Age of Onset  . Cancer Mother        jaw  . Hypertension Father   . Heart attack Father   . Heart attack Sister   . Colon cancer Neg Hx     Allergies  Allergen Reactions  . Other Rash and Anaphylaxis  . Shellfish Allergy Anaphylaxis and Rash  . Cephalexin     REACTION: trash and swelling REACTION: trash and swelling  . Ciprofloxacin     REACTION: u/k  . Clopidogrel Bisulfate     REACTION: swelling, rash  . Codeine Phosphate     REACTION: unspecified  . Ezetimibe-Simvastatin     REACTION: myalgias REACTION: myalgias  . Hydrocod Polst-Cpm Polst Er     REACTION: u/k  . Lidocaine Hives    ONLY TO ADHESIVE LIDOCAINE  PATCHES. NO PROBLEM WITH INJECTABLE LIDOCAINE.  Marland Kitchen Nitrofurantoin     REACTION: [place] REACTION: [place]  . Pregabalin     REACTION: felt bad REACTION: felt bad  . Propoxyphene N-Acetaminophen     REACTION: [place]  . Rofecoxib     unk unk   . Sulfamethoxazole     REACTION: unspecified  . Sulfonamide Derivatives     REACTION: unspecified  . Celecoxib Rash  REACTION: [place] REACTION: [place]  . Codeine Rash    REACTION: [place] REACTION: [place]  . Hydrocod Polst-Cpm Polst Er Rash  . Propoxyphene Rash  . Sulfa Antibiotics Rash    REACTION: unspecified REACTION: unspecified    Medication list reviewed and updated in full in [place].  ROS as above, eating and drinking - tolerating PO. Urinating normally. No excessive vomitting or diarrhea. O/w as above.  Objective:   Blood pressure 100/60, pulse 65, temperature 98.5 F (36.9 C), temperature source Oral, height 5' 8"  (1.727 m), weight 172 lb 4 oz (78.1 kg), SpO2 94 %.  GEN: WDWN, Non-toxic, Atraumatic, normocephalic. A and O x 3. HEENT: Oropharynx clear without exudate, MMM, no significant LAD, mild rhinnorhea Ears: TM clear, COL visualized with good landmarks CV: RRR, no m/g/r. Pulm: CTA B, no wheezes, rhonchi, or crackles, normal respiratory effort. EXT: no c/c/e Psych: well oriented, neither depressed nor anxious in appearance  Objective Data:  Assessment and Plan:   Laryngitis  Acute URI  Supportive care reviewed with patient. See patient instruction section. Trouble with sleeping - she has tolerated flexeril before, and cont with home OTC cold meds.  Follow-up: No follow-ups on file.  Meds ordered this encounter  Medications  . cyclobenzaprine (FLEXERIL) 5 MG tablet    Sig: Take 1 tablet (5 mg total) by mouth at bedtime.    Dispense:  15 tablet    Refill:  0    Signed,  Lachanda Buczek T. Mckay Brandt, MD   Patient's Medications  New Prescriptions   CYCLOBENZAPRINE (FLEXERIL) 5 MG TABLET    Take 1 tablet (5 mg total) by mouth  at bedtime.  Previous Medications   ASPIRIN (ASPIRIN LOW DOSE) 81 MG TABLET    Take 81 mg by mouth daily.     BLOOD GLUCOSE MONITORING SUPPL (ONETOUCH VERIO FLEX SYSTEM) W/DEVICE KIT    1 Device by Does not apply route once.   BRIMONIDINE-TIMOLOL (COMBIGAN) 0.2-0.5 % OPHTHALMIC SOLUTION    Place 1 drop into both eyes every 12 (twelve) hours.   CHOLECALCIFEROL (VITAMIN D-3) 1000 UNITS CAPS    Take 1 capsule by mouth daily.   EZETIMIBE (ZETIA) 10 MG TABLET    TAKE ONE TABLET BY MOUTH EVERY DAY   GLIPIZIDE (GLUCOTROL XL) 10 MG 24 HR TABLET    Take 1 tablet (10 mg total) by mouth daily with breakfast.   ISOSORBIDE MONONITRATE (IMDUR) 60 MG 24 HR TABLET    TAKE ONE TABLET BY MOUTH EVERY DAY   LOSARTAN (COZAAR) 50 MG TABLET    TAKE ONE TABLET BY MOUTH EVERY DAY   METOPROLOL SUCCINATE (TOPROL-XL) 100 MG 24 HR TABLET    TAKE ONE TABLET BY MOUTH EVERY MORNING WITH OR IMMEDIATELY FOLLOWING MEAL   NAPROXEN (NAPROSYN) 500 MG TABLET    Take 1 tablet (500 mg total) by mouth 2 (two) times daily as needed for moderate pain.   NITROGLYCERIN (NITROSTAT) 0.4 MG SL TABLET    Place 1 tablet (0.4 mg total) under the tongue every 5 (five) minutes as needed for chest pain.   ONE TOUCH LANCETS MISC    1 each by Does not apply route 2 (two) times daily.   ONE TOUCH ULTRA TEST TEST STRIP    TEST TWICE DAILY   OVER THE COUNTER MEDICATION    Take 1 tablet by mouth daily. Med name: VISION FORMULA   PANTOPRAZOLE (PROTONIX) 40 MG TABLET    Take 1 tablet (40 mg total) by mouth daily.  TIMOLOL (TIMOPTIC) 0.5 % OPHTHALMIC SOLUTION       TRIMETHOPRIM (TRIMPEX) 100 MG TABLET    Take 100 mg by mouth 2 (two) times daily as needed (UTI).    VITAMIN B-12 (CYANOCOBALAMIN) 1000 MCG TABLET    Take 1,000 mcg by mouth daily.  Modified Medications   No medications on file  Discontinued Medications   No medications on file

## 2018-04-01 ENCOUNTER — Other Ambulatory Visit: Payer: Self-pay

## 2018-04-01 ENCOUNTER — Emergency Department
Admission: EM | Admit: 2018-04-01 | Discharge: 2018-04-01 | Disposition: A | Discharge status: Home / Self Care | Payer: Medicare Other | Attending: Emergency Medicine | Admitting: Emergency Medicine

## 2018-04-01 ENCOUNTER — Encounter: Payer: Self-pay | Admitting: Emergency Medicine

## 2018-04-01 DIAGNOSIS — Z79899 Other long term (current) drug therapy: Secondary | ICD-10-CM | POA: Insufficient documentation

## 2018-04-01 DIAGNOSIS — B309 Viral conjunctivitis, unspecified: Secondary | ICD-10-CM | POA: Diagnosis not present

## 2018-04-01 DIAGNOSIS — Z7982 Long term (current) use of aspirin: Secondary | ICD-10-CM | POA: Insufficient documentation

## 2018-04-01 DIAGNOSIS — Z7984 Long term (current) use of oral hypoglycemic drugs: Secondary | ICD-10-CM | POA: Insufficient documentation

## 2018-04-01 DIAGNOSIS — Z955 Presence of coronary angioplasty implant and graft: Secondary | ICD-10-CM | POA: Diagnosis not present

## 2018-04-01 DIAGNOSIS — Z8572 Personal history of non-Hodgkin lymphomas: Secondary | ICD-10-CM | POA: Insufficient documentation

## 2018-04-01 DIAGNOSIS — J209 Acute bronchitis, unspecified: Secondary | ICD-10-CM

## 2018-04-01 DIAGNOSIS — I2510 Atherosclerotic heart disease of native coronary artery without angina pectoris: Secondary | ICD-10-CM | POA: Insufficient documentation

## 2018-04-01 DIAGNOSIS — I10 Essential (primary) hypertension: Secondary | ICD-10-CM | POA: Insufficient documentation

## 2018-04-01 DIAGNOSIS — E119 Type 2 diabetes mellitus without complications: Secondary | ICD-10-CM | POA: Diagnosis not present

## 2018-04-01 DIAGNOSIS — R05 Cough: Secondary | ICD-10-CM | POA: Diagnosis present

## 2018-04-01 MED ORDER — PREDNISONE 10 MG PO TABS: ORAL_TABLET | ORAL | 0 refills | Status: DC

## 2018-04-01 MED ORDER — AMOXICILLIN 500 MG PO CAPS: 500.0000 mg | ORAL_CAPSULE | Freq: Three times a day (TID) | ORAL | 0 refills | Status: DC

## 2018-04-01 NOTE — ED Triage Notes (Signed)
C/O productive cough since Monday.  States coughing up thick yellow phlegm.  Seen by PCP earlier in the week for same, given afrin for symptoms and something to help her rest.

## 2018-04-01 NOTE — Discharge Instructions (Addendum)
You have bronchitis and conjunctivitis. We are providing you with a RX for Amoxicillin and Prednisone. Continue Afrin and Delsym OTC. Warm compresses to right eye 3 x day. Follow up with PCP early next week if no improvement

## 2018-04-01 NOTE — ED Provider Notes (Signed)
[facility] Emergency Department Provider Note ____________________________________________  Time seen: 31  I have reviewed the triage vital signs and the nursing notes.  HISTORY  Chief Complaint  Cough   HPI [name] [name] is a 84 y.o. female presents to the ER today with complaints of fever, nasal congestion, sore throat, cough and shortness of breath.  She reports this started about 1 week ago.  She saw Donella Stade, MD on 429 for the same.  She was diagnosed with a viral URI.  She was advised to try Afrin over-the-counter, which she has tried without any relief.  Her fever today is 100.8.  She is blowing yellow mucus out of her nose.  She denies difficulty swallowing.  The cough is productive of green mucus.  She denies dizziness, chest pain, or chest tightness.  She has also tried Delsym, ibuprofen, Tylenol with minimal relief.  She does have a history of allergies.  She has not had sick contacts that she is aware of.  She is up-to-date with vaccines.  Past Medical History:  Diagnosis Date  . Allergy    Cipro, Plavix, Statins  . CAD (coronary artery disease)    a. s/p tandem Promus DES to LAD in 2009 by Dr. Olevia Perches;  b.  LHC (03/22/14):  prox LAD 30%, mid LAD 40%, LAD stent ok with dist 20% ISR, apical LAD occluded with L-L collats filling apical vessel (too small for PCI), mid RCA 20%, EF 70%.  Med Rx  . Diabetes mellitus    Diagnosed 2012  . Diverticulosis of colon (without mention of hemorrhage)   . GERD (gastroesophageal reflux disease)   . Hx of cardiovascular stress test    a. Nuclear (08/2013):  No ischemia, EF 80%, Normal  . Hx of echocardiogram    a. Echo (07/2013):  Mild LVH, vigorous LVF, EF 65-70%, Gr 1 DD, mild MR, mild to mod LAE  . Hyperlipidemia    intol of statins  . Hypertension   . NHL (non-Hodgkin's lymphoma) ([place]) dx'd 2003   chemo/xrt comp 2003  . Personal history of colonic polyps   . Proctitis   . Urticaria     Patient  Active Problem List   Diagnosis Date Noted  . Recurrent UTI 01/12/2018  . Acute non-recurrent pansinusitis 12/26/2017  . Neck pain 09/27/2017  . Insomnia 09/27/2017  . Situational insomnia 01/26/2017  . Stress due to illness of family member 05/04/2016  . Pain in joints of both feet 01/14/2016  . Osteopenia 08/06/2015  . Constipation 12/30/2014  . B12 deficiency 12/30/2014  . Allergic reaction 09/02/2014  . Arthralgia of right foot 07/18/2014  . Pruritic intertrigo 07/18/2014  . Right hip pain 06/04/2014  . Other malaise and fatigue 06/04/2014  . Elevated LFTs 04/19/2014  . Mitral regurgitation 04/19/2014  . Pes planus 06/13/2013  . Renal insufficiency 06/07/2011  . Type 2 diabetes mellitus ([place]) 05/31/2011  . MIXED HYPERLIPIDEMIA 08/04/2010  . LUMBAR SPRAIN AND STRAIN 01/15/2010  . 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 02/25/2009  . EXTERNAL HEMORRHOIDS 02/24/2009  . DIVERTICULOSIS, COLON 02/24/2009  . LYMPHOMA, MALT 08/28/2007  . DETACHED RETINA 08/28/2007  . Essential hypertension 08/28/2007  . Coronary atherosclerosis 08/28/2007  . MITRAL VALVE PROLAPSE 08/28/2007  . GERD 08/28/2007  . IBS 08/28/2007  . BREAST CYSTS, BILATERAL 08/28/2007    Past Surgical History:  Procedure Laterality Date  . cardiac cath-neg    . CORONARY ANGIOPLASTY WITH STENT PLACEMENT     x 2  .  dexa-neg    . KNEE SURGERY  10/2003   left  . laser surgery for cataracts-left    . LEFT HEART CATHETERIZATION WITH CORONARY ANGIOGRAM N/A 03/22/2014   Procedure: LEFT HEART CATHETERIZATION WITH CORONARY ANGIOGRAM;  Surgeon: Burnell Blanks, MD;  Location: [facility] CATH LAB;  Service: Cardiovascular;  Laterality: N/A;  . stress cardiolite    . VAGINAL HYSTERECTOMY     partial, fibroids, one ovary left    Prior to Admission medications   Medication Sig Start Date End Date Taking? Authorizing Provider  amoxicillin (AMOXIL) 500 MG capsule Take 1 capsule (500 mg total) by mouth 3 (three)  times daily. 04/01/18   Jearld Fenton, NP  aspirin (ASPIRIN LOW DOSE) 81 MG tablet Take 81 mg by mouth daily.      [provider]  Blood Glucose Monitoring Suppl ([place]) w/Device KIT 1 Device by Does not apply route once. 01/14/16   Lucille Passy, MD  brimonidine-timolol (COMBIGAN) 0.2-0.5 % ophthalmic solution Place 1 drop into both eyes every 12 (twelve) hours.    [provider]  Cholecalciferol (VITAMIN D-3) 1000 UNITS CAPS Take 1 capsule by mouth daily.    [provider]  cyclobenzaprine (FLEXERIL) 5 MG tablet Take 1 tablet (5 mg total) by mouth at bedtime. 03/27/18   [place], Frederico Hamman, MD  ezetimibe (ZETIA) 10 MG tablet TAKE ONE TABLET BY MOUTH EVERY DAY 02/08/18   Burnell Blanks, MD  glipiZIDE (GLUCOTROL XL) 10 MG 24 hr tablet Take 1 tablet (10 mg total) by mouth daily with breakfast. 01/18/18   Pleas Koch, NP  isosorbide mononitrate (IMDUR) 60 MG 24 hr tablet TAKE ONE TABLET BY MOUTH EVERY DAY 02/20/18   Burnell Blanks, MD  losartan (COZAAR) 50 MG tablet TAKE ONE TABLET BY MOUTH EVERY DAY 02/08/18   Burnell Blanks, MD  metoprolol succinate (TOPROL-XL) 100 MG 24 hr tablet TAKE ONE TABLET BY MOUTH EVERY MORNING WITH OR IMMEDIATELY FOLLOWING MEAL 02/03/18   Burnell Blanks, MD  naproxen (NAPROSYN) 500 MG tablet Take 1 tablet (500 mg total) by mouth 2 (two) times daily as needed for moderate pain. 02/15/18   Pleas Koch, NP  nitroGLYCERIN (NITROSTAT) 0.4 MG SL tablet Place 1 tablet (0.4 mg total) under the tongue every 5 (five) minutes as needed for chest pain. 09/15/17   Burnell Blanks, MD  ONE TOUCH LANCETS MISC 1 each by Does not apply route 2 (two) times daily.    [provider]  ONE TOUCH ULTRA TEST test strip TEST TWICE DAILY 06/16/17   Lucille Passy, MD  OVER THE COUNTER MEDICATION Take 1 tablet by mouth daily. Med name: VISION FORMULA    [provider]  pantoprazole  (PROTONIX) 40 MG tablet Take 1 tablet (40 mg total) by mouth daily. 10/26/17   Lucille Passy, MD  predniSONE (DELTASONE) 10 MG tablet Take 3 tabs on days 1-2, take 2 tabs on days 3-4, take 1 tab on days 5-6 04/01/18   Jearld Fenton, NP  timolol (TIMOPTIC) 0.5 % ophthalmic solution  02/21/18   [provider]  trimethoprim (TRIMPEX) 100 MG tablet Take 100 mg by mouth 2 (two) times daily as needed (UTI).  04/12/16   [provider]  vitamin B-12 (CYANOCOBALAMIN) 1000 MCG tablet Take 1,000 mcg by mouth daily.    [provider]  loratadine (CLARITIN) 10 MG tablet Take 10 mg by mouth as needed.   02/15/12  [provider]    Allergies Other; Shellfish allergy; Cephalexin; Ciprofloxacin; Clopidogrel bisulfate; Codeine phosphate; Ezetimibe-simvastatin; Hydrocod polst-cpm polst er; Lidocaine; Nitrofurantoin; Pregabalin; Propoxyphene n-acetaminophen; Rofecoxib; Sulfamethoxazole; Sulfonamide derivatives; Celecoxib; Codeine; Hydrocod polst-cpm polst er; Propoxyphene; and Sulfa antibiotics  Family History  Problem Relation Age of Onset  . Cancer Mother        jaw  . Hypertension Father   . Heart attack Father   . Heart attack Sister   . Colon cancer Neg Hx     Social History Social History   Tobacco Use  . Smoking status: Never Smoker  . Smokeless tobacco: Never Used  Substance Use Topics  . Alcohol use: No  . Drug use: No    Review of Systems  Constitutional: Positive for fever. Eyes: Negative for visual changes. ENT: Positive for nasal congestion, sore throat. Cardiovascular: Negative for chest pain. Respiratory: Positive for cough and shortness of breath.   Gastrointestinal: Negative for abdominal pain, vomiting and diarrhea. Skin: Negative for rash. Neurological: Negative for headaches, focal weakness or numbness. ____________________________________________  PHYSICAL EXAM:  VITAL SIGNS: ED Triage Vitals  Enc Vitals Group     BP 04/01/18 0916  (!) 151/88     Pulse Rate 04/01/18 0916 93     Resp 04/01/18 0916 16     Temp 04/01/18 0916 (!) 100.8 F (38.2 C)     Temp Source 04/01/18 0916 Oral     SpO2 04/01/18 0916 95 %     Weight 04/01/18 0917 172 lb (78 kg)     Height 04/01/18 0917 5' 8"  (1.727 m)     Head Circumference --      Peak Flow --      Pain Score 04/01/18 0916 3     Pain Loc --      Pain Edu? --      Excl. in [place]? --     Constitutional: Alert and oriented.  Ill appearing but in no distress. Eyes: Right sclera injected, conjunctiva erythematous but no discharge noted.   Ears: Canals clear. TMs intact bilaterally. Nose: Mucosa boggy, turbinates swollen. Mouth/Throat: Mucous membranes are moist.  + postnasal drip. Neck: Supple. No adenopathy Hematological/Lymphatic/Immunological: No cervical lymphadenopathy. Cardiovascular: Normal rate, regular rhythm.?  Slight murmur noted. Respiratory: Normal respiratory effort.  Scattered rhonchi throughout and bilateral expiratory wheezing noted Neurologic: Normal speech and language. No gross focal neurologic deficits are appreciated. Skin:  Skin is warm, dry and intact. No rash noted.  INITIAL IMPRESSION / ASSESSMENT AND PLAN / ED COURSE  Acute Bronchitis, Viral Conjunctivitis:  She declines breathing treatment in ER today She declines chest x-ray to rule out pneumonia today She declines ibuprofen or Tylenol for fever, reports she will get this at home Rx provided for amoxicillin x10 days Rx provided for prednisone x6 days-advised her to monitor blood sugars She will continue ibuprofen and Tylenol over-the-counter She will continue Delsym as needed for cough  she will continue Afrin for nasal congestion Warm compresses to the right eye for 10 minutes 3 times a day Can use saline eyedrops for comfort Follow-up with PCP if symptoms persist or worsen  FINAL CLINICAL IMPRESSION(S) / ED DIAGNOSES  Final diagnoses:  Acute bronchitis, unspecified organism  Viral  conjunctivitis      Jearld Fenton, NP 04/01/18 1045    Lavonia Drafts, MD 04/01/18 1313

## 2018-04-03 ENCOUNTER — Telehealth: Payer: Self-pay

## 2018-04-03 NOTE — Telephone Encounter (Signed)
Per chart review tab pt went to [facility] ED on 04/01/18.

## 2018-04-03 NOTE — Telephone Encounter (Signed)
PLEASE NOTE: All timestamps contained within this report are represented as [place] Standard Time. CONFIDENTIALTY NOTICE: This fax transmission is intended only for the addressee. It contains information that is legally privileged, confidential or otherwise protected from use or disclosure. If you are not the intended recipient, you are strictly prohibited from reviewing, disclosing, copying using or disseminating any of this information or taking any action in reliance on or regarding this information. If you have received this fax in error, please notify us immediately by telephone so that we can arrange for its return to [place]. Phone: [phone], Toll-Free: [phone], Fax: [phone] Page: 1 of 2 Call Id: [id number] [place] Patient Name: [name] [name] Gender: Female DOB: 1932/11/08 Age: 84 Y 2 M 1 D Return Phone Number: [phone] (Primary) Address: City/State/Zip: [place] [address] Client [facility] Night - Client Client Site [place] Physician [name] - NP Contact Type Call Who Is Calling Patient / Member / Family / Caregiver Call Type Triage / Clinical Relationship To Patient Self Return Phone Number [phone] (Primary) Chief Complaint BREATHING - shortness of breath or sounds breathless Reason for Call Symptomatic / Request for [place] states she is having trouble breathing and is coughing. Translation No Nurse Assessment Nurse: Allene Dillon, RN, [place] Date/Time (Eastern Time): 04/01/2018 7:58:41 AM Confirm and document reason for call. If symptomatic, describe symptoms. ---Caller states she is having trouble breathing and is coughing. Does the patient have any new or worsening symptoms? ---Yes Will a triage be completed? ---Yes Related visit to physician within the last 2  weeks? ---Yes Does the PT have any chronic conditions? (i.e. diabetes, asthma, etc.) ---Yes List chronic conditions. ---diabetic Is this a behavioral health or substance abuse call? ---No Guidelines Guideline Title Affirmed Question Affirmed Notes Nurse Date/Time (Eastern Time) Cough - Acute Productive Difficulty breathing Allene Dillon, RN, Tabatha 04/01/2018 8:00:02 AM Disp. Time Eilene Ghazi Time) Disposition Final User 04/01/2018 7:56:31 AM Send to Urgent Queue Kelby Aline 04/01/2018 8:01:49 AM Go to ED Now Yes Allene Dillon, RN, Cassandria [place] Caller Disagree/Comply Comply Caller Understands Yes PreDisposition InappropriateToAsk PLEASE NOTE: All timestamps contained within this report are represented as [place] Standard Time. CONFIDENTIALTY NOTICE: This fax transmission is intended only for the addressee. It contains information that is legally privileged, confidential or otherwise protected from use or disclosure. If you are not the intended recipient, you are strictly prohibited from reviewing, disclosing, copying using or disseminating any of this information or taking any action in reliance on or regarding this information. If you have received this fax in error, please notify us immediately by telephone so that we can arrange for its return to [place]. Phone: [phone], Toll-Free: [phone], Fax: [phone] Page: 2 of 2 Call Id: [id number] Care Advice Given Per Guideline GO TO ED NOW: You need to be seen in the Emergency Department. Go to the ER at ___________ [place] now. Drive carefully. DRIVING: Another adult should drive. CARE ADVICE given per Cough - Acute Productive (Adult) guideline. Referrals [facility] - ED

## 2018-04-03 NOTE — Telephone Encounter (Signed)
Noted, chart reviewed

## 2018-04-25 ENCOUNTER — Encounter: Payer: Self-pay | Admitting: Internal Medicine

## 2018-04-25 ENCOUNTER — Ambulatory Visit: Payer: Medicare Other | Admitting: Internal Medicine

## 2018-04-25 VITALS — BP 126/78 | HR 75 | Temp 98.2°F | Wt 172.0 lb

## 2018-04-25 DIAGNOSIS — H9202 Otalgia, left ear: Secondary | ICD-10-CM | POA: Diagnosis not present

## 2018-04-25 DIAGNOSIS — H6122 Impacted cerumen, left ear: Secondary | ICD-10-CM | POA: Diagnosis not present

## 2018-04-25 DIAGNOSIS — J301 Allergic rhinitis due to pollen: Secondary | ICD-10-CM

## 2018-04-25 MED ORDER — BENZONATATE 100 MG PO CAPS: 100.0000 mg | ORAL_CAPSULE | Freq: Three times a day (TID) | ORAL | 0 refills | Status: DC | PRN

## 2018-04-25 MED ORDER — FEXOFENADINE HCL 180 MG PO TABS: 180.0000 mg | ORAL_TABLET | Freq: Every day | ORAL | 2 refills | Status: DC

## 2018-04-25 MED ORDER — FLUTICASONE PROPIONATE 50 MCG/ACT NA SUSP: 2.0000 | Freq: Every day | NASAL | 6 refills | Status: DC

## 2018-04-25 NOTE — Progress Notes (Signed)
HPI  Pt presents to the clinic today with c/o ear pain,runny nose, sore throat and cough. She reports this started 2-3 days ago. She describes the ear pain as sharp, full. The left seems worse than right. She denies loss of hearing. She is blowing clear mucous out of her nose. She denies difficulty swallowing but reports the left side of her throat seems worse than the right. The cough is productive of green/grey mucous. She denies fever, chills, body aches or shortness of breath.  She has a history of DM 2. She was recently seen in the ER for acute bronchitis, treated with abx and steroids.  Review of Systems      Past Medical History:  Diagnosis Date  . Allergy    Cipro, Plavix, Statins  . CAD (coronary artery disease)    a. s/p tandem Promus DES to LAD in 2009 by Dr. Olevia Perches;  b.  LHC (03/22/14):  prox LAD 30%, mid LAD 40%, LAD stent ok with dist 20% ISR, apical LAD occluded with L-L collats filling apical vessel (too small for PCI), mid RCA 20%, EF 70%.  Med Rx  . Diabetes mellitus    Diagnosed 2012  . Diverticulosis of colon (without mention of hemorrhage)   . GERD (gastroesophageal reflux disease)   . Hx of cardiovascular stress test    a. Nuclear (08/2013):  No ischemia, EF 80%, Normal  . Hx of echocardiogram    a. Echo (07/2013):  Mild LVH, vigorous LVF, EF 65-70%, Gr 1 DD, mild MR, mild to mod LAE  . Hyperlipidemia    intol of statins  . Hypertension   . NHL (non-Hodgkin's lymphoma) ([place]) dx'd 2003   chemo/xrt comp 2003  . Personal history of colonic polyps   . Proctitis   . Urticaria     Family History  Problem Relation Age of Onset  . Cancer Mother        jaw  . Hypertension Father   . Heart attack Father   . Heart attack Sister   . Colon cancer Neg Hx     Social History   Socioeconomic History  . Marital status: Married    Spouse name: Not on file  . Number of children: Not on file  . Years of education: Not on file  . Highest education level: Not on file   Occupational History  . Occupation: Retired    Fish farm manager: RETIRED  Social Needs  . Financial resource strain: Not on file  . Food insecurity:    Worry: Not on file    Inability: Not on file  . Transportation needs:    Medical: Not on file    Non-medical: Not on file  Tobacco Use  . Smoking status: Never Smoker  . Smokeless tobacco: Never Used  Substance and Sexual Activity  . Alcohol use: No  . Drug use: No  . Sexual activity: Never  Lifestyle  . Physical activity:    Days per week: Not on file    Minutes per session: Not on file  . Stress: Not on file  Relationships  . Social connections:    Talks on phone: Not on file    Gets together: Not on file    Attends religious service: Not on file    Active member of club or organization: Not on file    Attends meetings of clubs or organizations: Not on file    Relationship status: Not on file  . Intimate partner violence:    Fear of  current or ex partner: Not on file    Emotionally abused: Not on file    Physically abused: Not on file    Forced sexual activity: Not on file  Other Topics Concern  . Not on file  Social History Narrative  . Not on file    Allergies  Allergen Reactions  . Other Rash and Anaphylaxis  . Shellfish Allergy Anaphylaxis and Rash  . Cephalexin     REACTION: trash and swelling REACTION: trash and swelling  . Ciprofloxacin     REACTION: u/k  . Clopidogrel Bisulfate     REACTION: swelling, rash  . Codeine Phosphate     REACTION: unspecified  . Ezetimibe-Simvastatin     REACTION: myalgias REACTION: myalgias  . Hydrocod Polst-Cpm Polst Er     REACTION: u/k  . Lidocaine Hives    ONLY TO ADHESIVE LIDOCAINE PATCHES. NO PROBLEM WITH INJECTABLE LIDOCAINE.  Marland Kitchen Nitrofurantoin     REACTION: [place] REACTION: [place]  . Pregabalin     REACTION: felt bad REACTION: felt bad  . Propoxyphene N-Acetaminophen     REACTION: [place]  . Rofecoxib     unk unk   . Sulfamethoxazole     REACTION: unspecified  .  Sulfonamide Derivatives     REACTION: unspecified  . Celecoxib Rash    REACTION: [place] REACTION: [place]  . Codeine Rash    REACTION: [place] REACTION: [place]  . Hydrocod Polst-Cpm Polst Er Rash  . Propoxyphene Rash  . Sulfa Antibiotics Rash    REACTION: unspecified REACTION: unspecified     Constitutional:  Denies headache, fatigue, fever or abrupt weight changes.  HEENT:  Positive ear pain, runny nose, sore throat. Denies eye redness, eye pain, pressure behind the eyes, facial pain, nasal congestion, ringing in the ears, wax buildup, or bloody nose. Respiratory: Positive cough. Denies difficulty breathing or shortness of breath.  Cardiovascular: Denies chest pain, chest tightness, palpitations or swelling in the hands or feet.   No other specific complaints in a complete review of systems (except as listed in HPI above).  Objective:   BP 126/78   Pulse 75   Temp 98.2 F (36.8 C) (Oral)   Wt 172 lb (78 kg)   SpO2 97%   BMI 26.15 kg/m  Wt Readings from Last 3 Encounters:  04/25/18 172 lb (78 kg)  04/01/18 172 lb (78 kg)  03/27/18 172 lb 4 oz (78.1 kg)     General: Appears her stated age, well developed, well nourished in NAD. HEENT: Head: normal shape and size, no sinus tenderness noted; Right Ear: Tm's gray and intact, normal light reflex; Left Ear: cerumen impaction; Nose: mucosa pink and moist, septum midline; Throat/Mouth: + PND. Teeth present, mucosa pinkand moist, no exudate noted, no lesions or ulcerations noted.  Neck: No cervical lymphadenopathy.  Cardiovascular: Normal rate and rhythm. S1,S2 noted.  No murmur, rubs or gallops noted.  Pulmonary/Chest: Normal effort and positive vesicular breath sounds. No respiratory distress. No wheezes, rales or ronchi noted.       Assessment & Plan:   Otalgia, Cerumen Impaction, Allergic Rhinitis:  Get some rest and drink plenty of water Do salt water gargles for the sore throat Removal of cerumen in left ear with cerumen spoon by this  provider, Left ear canal intact without erythema or drainage, TM grey and intact, normal light reflex. Stop Claritin, Start Allegra OTC eRx for [facility] sent to pharmacy eRx for Tessalon 100 mg TID prn sent to pharmacy  RTC as needed or  if symptoms persist.   Webb Silversmith, NP

## 2018-04-25 NOTE — Patient Instructions (Signed)
Earwax Buildup, Adult The ears produce a substance called earwax that helps keep bacteria out of the ear and protects the skin in the ear canal. Occasionally, earwax can build up in the ear and cause discomfort or hearing loss. What increases the risk? This condition is more likely to develop in people who:  Are female.  Are elderly.  Naturally produce more earwax.  Clean their ears often with cotton swabs.  Use earplugs often.  Use in-ear headphones often.  Wear hearing aids.  Have narrow ear canals.  Have earwax that is overly thick or sticky.  Have eczema.  Are dehydrated.  Have excess hair in the ear canal.  What are the signs or symptoms? Symptoms of this condition include:  Reduced or muffled hearing.  A feeling of fullness in the ear or feeling that the ear is plugged.  Fluid coming from the ear.  Ear pain.  Ear itch.  Ringing in the ear.  Coughing.  An obvious piece of earwax that can be seen inside the ear canal.  How is this diagnosed? This condition may be diagnosed based on:  Your symptoms.  Your medical history.  An ear exam. During the exam, your health care provider will look into your ear with an instrument called an otoscope.  You may have tests, including a hearing test. How is this treated? This condition may be treated by:  Using ear drops to soften the earwax.  Having the earwax removed by a health care provider. The health care provider may: ? Flush the ear with water. ? Use an instrument that has a loop on the end (curette). ? Use a suction device.  Surgery to remove the wax buildup. This may be done in severe cases.  Follow these instructions at home:  Take over-the-counter and prescription medicines only as told by your health care provider.  Do not put any objects, including cotton swabs, into your ear. You can clean the opening of your ear canal with a washcloth or facial tissue.  Follow instructions from your health  care provider about cleaning your ears. Do not over-clean your ears.  Drink enough fluid to keep your urine clear or pale yellow. This will help to thin the earwax.  Keep all follow-up visits as told by your health care provider. If earwax builds up in your ears often or if you use hearing aids, consider seeing your health care provider for routine, preventive ear cleanings. Ask your health care provider how often you should schedule your cleanings.  If you have hearing aids, clean them according to instructions from the manufacturer and your health care provider. Contact a health care provider if:  You have ear pain.  You develop a fever.  You have blood, pus, or other fluid coming from your ear.  You have hearing loss.  You have ringing in your ears that does not go away.  Your symptoms do not improve with treatment.  You feel like the room is spinning (vertigo). Summary  Earwax can build up in the ear and cause discomfort or hearing loss.  The most common symptoms of this condition include reduced or muffled hearing and a feeling of fullness in the ear or feeling that the ear is plugged.  This condition may be diagnosed based on your symptoms, your medical history, and an ear exam.  This condition may be treated by using ear drops to soften the earwax or by having the earwax removed by a health care provider.  Do   not put any objects, including cotton swabs, into your ear. You can clean the opening of your ear canal with a washcloth or facial tissue. This information is not intended to replace advice given to you by your health care provider. Make sure you discuss any questions you have with your health care provider. Document Released: 12/23/2004 Document Revised: 01/26/2017 Document Reviewed: 01/26/2017 Elsevier Interactive Patient Education  2018 Elsevier Inc.  

## 2018-05-02 ENCOUNTER — Ambulatory Visit: Payer: Medicare Other | Admitting: Primary Care

## 2018-05-02 ENCOUNTER — Encounter: Payer: Self-pay | Admitting: Primary Care

## 2018-05-02 VITALS — BP 120/70 | HR 78 | Temp 98.1°F | Ht 68.0 in | Wt 171.5 lb

## 2018-05-02 DIAGNOSIS — Z6379 Other stressful life events affecting family and household: Secondary | ICD-10-CM

## 2018-05-02 DIAGNOSIS — E1165 Type 2 diabetes mellitus with hyperglycemia: Secondary | ICD-10-CM | POA: Diagnosis not present

## 2018-05-02 LAB — POCT GLYCOSYLATED HEMOGLOBIN (HGB A1C): HEMOGLOBIN A1C: 8.7 % — AB (ref 4.0–5.6)

## 2018-05-02 MED ORDER — METFORMIN HCL ER 750 MG PO TB24: ORAL_TABLET | ORAL | 1 refills | Status: DC

## 2018-05-02 NOTE — Patient Instructions (Addendum)
Start metformin ER 750 mg tablets for diabetes. Take 1 tablet by mouth every morning with breakfast for diabetes.  Continue taking Glipizide XL 10 mg daily.  Start checking your blood sugars at differing times of the day: morning before breakfast, before lunch, before dinner.  Work on regular exercise, this may help reduce fatigue and decrease stress.  Please schedule a follow up appointment in 3 months for diabetes check.  It was a pleasure to see you today!

## 2018-05-02 NOTE — Progress Notes (Signed)
Subjective:    Patient ID: [name] [name], female    DOB: March 13, 1932, 84 y.o.   MRN: [id number]  HPI  [name] is an 84 year old female who presents today for follow up and a chief complaint of fatigue.  1) Type 2 Diabetes:   Current medications include: Glipizide XL 10 mg,   She is checking her blood glucose 2 times daily and is getting readings of AM fasting: 160's 1 hour after dinner: 180-190  Last A1C: 8.6 in February 2019, 8.7 today. Last Eye Exam: Completed in February 2019 Last Foot Exam: Due Pneumonia Vaccination: Completed last in 2015 ACE/ARB: Losartan Statin: Zetia   Diet currently consists of:  Breakfast: Egg, toast, cereal  Lunch: Sandwich, fruit Dinner: Meat, vegetable, starch Snacks: Rarely  Desserts: Occasionally, teaspoon of honey Beverages: Water, peach tea    2) Fatigue: Chronic for around one year. She feels tired throughout the day. Goes to bed around 9:30 am, wakes around 2:45 am and will sometimes sleep and other times will stay awake. Sometimes she'll nap around 11 am if she gets up around 2:45 am. She feels as though she could take a nap daily if she allowed herself to do it. She'll feel "shakes" in the morning before breakfast and lunch. She continues to care for her husband who is completely dependant on her due to dementia. She is under stress. She does make sure to get out of her home at least once daily.   She does not regularly exercise. She underwent lab work last visit including TSH which was unremarkable. She denies a history of anemia.     Review of Systems  Constitutional: Positive for fatigue.  Eyes: Negative for visual disturbance.  Respiratory: Negative for shortness of breath.   Cardiovascular: Negative for chest pain.  Neurological: Negative for dizziness and headaches.  Psychiatric/Behavioral:       Caregiver stress       Past Medical History:  Diagnosis Date  . Allergy    Cipro, Plavix, Statins  . CAD (coronary artery  disease)    a. s/p tandem Promus DES to LAD in 2009 by Dr. Olevia Perches;  b.  LHC (03/22/14):  prox LAD 30%, mid LAD 40%, LAD stent ok with dist 20% ISR, apical LAD occluded with L-L collats filling apical vessel (too small for PCI), mid RCA 20%, EF 70%.  Med Rx  . Diabetes mellitus    Diagnosed 2012  . Diverticulosis of colon (without mention of hemorrhage)   . GERD (gastroesophageal reflux disease)   . Hx of cardiovascular stress test    a. Nuclear (08/2013):  No ischemia, EF 80%, Normal  . Hx of echocardiogram    a. Echo (07/2013):  Mild LVH, vigorous LVF, EF 65-70%, Gr 1 DD, mild MR, mild to mod LAE  . Hyperlipidemia    intol of statins  . Hypertension   . NHL (non-Hodgkin's lymphoma) ([place]) dx'd 2003   chemo/xrt comp 2003  . Personal history of colonic polyps   . Proctitis   . Urticaria      Social History   Socioeconomic History  . Marital status: Married    Spouse name: Not on file  . Number of children: Not on file  . Years of education: Not on file  . Highest education level: Not on file  Occupational History  . Occupation: Retired    Fish farm manager: RETIRED  Social Needs  . Financial resource strain: Not on file  . Food insecurity:  Worry: Not on file    Inability: Not on file  . Transportation needs:    Medical: Not on file    Non-medical: Not on file  Tobacco Use  . Smoking status: Never Smoker  . Smokeless tobacco: Never Used  Substance and Sexual Activity  . Alcohol use: No  . Drug use: No  . Sexual activity: Never  Lifestyle  . Physical activity:    Days per week: Not on file    Minutes per session: Not on file  . Stress: Not on file  Relationships  . Social connections:    Talks on phone: Not on file    Gets together: Not on file    Attends religious service: Not on file    Active member of club or organization: Not on file    Attends meetings of clubs or organizations: Not on file    Relationship status: Not on file  . Intimate partner violence:    Fear  of current or ex partner: Not on file    Emotionally abused: Not on file    Physically abused: Not on file    Forced sexual activity: Not on file  Other Topics Concern  . Not on file  Social History Narrative  . Not on file    Past Surgical History:  Procedure Laterality Date  . cardiac cath-neg    . CORONARY ANGIOPLASTY WITH STENT PLACEMENT     x 2  . dexa-neg    . KNEE SURGERY  10/2003   left  . laser surgery for cataracts-left    . LEFT HEART CATHETERIZATION WITH CORONARY ANGIOGRAM N/A 03/22/2014   Procedure: LEFT HEART CATHETERIZATION WITH CORONARY ANGIOGRAM;  Surgeon: Burnell Blanks, MD;  Location: [facility] CATH LAB;  Service: Cardiovascular;  Laterality: N/A;  . stress cardiolite    . VAGINAL HYSTERECTOMY     partial, fibroids, one ovary left    Family History  Problem Relation Age of Onset  . Cancer Mother        jaw  . Hypertension Father   . Heart attack Father   . Heart attack Sister   . Colon cancer Neg Hx     Allergies  Allergen Reactions  . Other Rash and Anaphylaxis  . Shellfish Allergy Anaphylaxis and Rash  . Cephalexin     REACTION: trash and swelling REACTION: trash and swelling  . Ciprofloxacin     REACTION: u/k  . Clopidogrel Bisulfate     REACTION: swelling, rash  . Codeine Phosphate     REACTION: unspecified  . Ezetimibe-Simvastatin     REACTION: myalgias REACTION: myalgias  . Hydrocod Polst-Cpm Polst Er     REACTION: u/k  . Lidocaine Hives    ONLY TO ADHESIVE LIDOCAINE PATCHES. NO PROBLEM WITH INJECTABLE LIDOCAINE.  Marland Kitchen Nitrofurantoin     REACTION: [place] REACTION: [place]  . Pregabalin     REACTION: felt bad REACTION: felt bad  . Propoxyphene N-Acetaminophen     REACTION: [place]  . Rofecoxib     unk unk   . Sulfamethoxazole     REACTION: unspecified  . Sulfonamide Derivatives     REACTION: unspecified  . Celecoxib Rash    REACTION: [place] REACTION: [place]  . Codeine Rash    REACTION: [place] REACTION: [place]  . Hydrocod Polst-Cpm Polst Er Rash  .  Propoxyphene Rash  . Sulfa Antibiotics Rash    REACTION: unspecified REACTION: unspecified    Current Outpatient Medications on File Prior to Visit  Medication Sig  Dispense Refill  . aspirin (ASPIRIN LOW DOSE) 81 MG tablet Take 81 mg by mouth daily.      . Blood Glucose Monitoring Suppl ([place]) w/Device KIT 1 Device by Does not apply route once. 1 kit 0  . brimonidine-timolol (COMBIGAN) 0.2-0.5 % ophthalmic solution Place 1 drop into both eyes every 12 (twelve) hours.    . Cholecalciferol (VITAMIN D-3) 1000 UNITS CAPS Take 1 capsule by mouth daily.    Marland Kitchen ezetimibe (ZETIA) 10 MG tablet TAKE ONE TABLET BY MOUTH EVERY DAY 90 tablet 2  . fexofenadine (ALLEGRA) 180 MG tablet Take 1 tablet (180 mg total) by mouth daily. 30 tablet 2  . fluticasone (FLONASE) 50 MCG/ACT nasal spray Place 2 sprays into both nostrils daily. 16 g 6  . glipiZIDE (GLUCOTROL XL) 10 MG 24 hr tablet Take 1 tablet (10 mg total) by mouth daily with breakfast. 90 tablet 0  . isosorbide mononitrate (IMDUR) 60 MG 24 hr tablet TAKE ONE TABLET BY MOUTH EVERY DAY 90 tablet 1  . losartan (COZAAR) 50 MG tablet TAKE ONE TABLET BY MOUTH EVERY DAY 90 tablet 2  . metoprolol succinate (TOPROL-XL) 100 MG 24 hr tablet TAKE ONE TABLET BY MOUTH EVERY MORNING WITH OR IMMEDIATELY FOLLOWING MEAL 90 tablet 0  . naproxen (NAPROSYN) 500 MG tablet Take 1 tablet (500 mg total) by mouth 2 (two) times daily as needed for moderate pain. 30 tablet 0  . nitroGLYCERIN (NITROSTAT) 0.4 MG SL tablet Place 1 tablet (0.4 mg total) under the tongue every 5 (five) minutes as needed for chest pain. 26 tablet 6  . ONE TOUCH LANCETS MISC 1 each by Does not apply route 2 (two) times daily.    . ONE TOUCH ULTRA TEST test strip TEST TWICE DAILY 100 each 10  . OVER THE COUNTER MEDICATION Take 1 tablet by mouth daily. Med name: VISION FORMULA    . pantoprazole (PROTONIX) 40 MG tablet Take 1 tablet (40 mg total) by mouth daily. 30 tablet 3  . timolol  (TIMOPTIC) 0.5 % ophthalmic solution     . trimethoprim (TRIMPEX) 100 MG tablet Take 100 mg by mouth 2 (two) times daily as needed (UTI).     . vitamin B-12 (CYANOCOBALAMIN) 1000 MCG tablet Take 1,000 mcg by mouth daily.    . cyclobenzaprine (FLEXERIL) 5 MG tablet Take 1 tablet (5 mg total) by mouth at bedtime. (Patient not taking: Reported on 05/02/2018) 15 tablet 0  . [DISCONTINUED] loratadine (CLARITIN) 10 MG tablet Take 10 mg by mouth as needed.      No current facility-administered medications on file prior to visit.     BP 120/70   Pulse 78   Temp 98.1 F (36.7 C) (Oral)   Ht _0  (1.727 m)   Wt 171 lb 8 oz (77.8 kg)   SpO2 96%   BMI 26.08 kg/m    Objective:   Physical Exam  Constitutional: She appears well-nourished.  Neck: Neck supple.  Cardiovascular: Normal rate and regular rhythm.  Respiratory: Effort normal and breath sounds normal.  Skin: Skin is warm and dry.  Psychiatric: She has a normal mood and affect.           Assessment & Plan:

## 2018-05-02 NOTE — Assessment & Plan Note (Signed)
A1C of 8.7 today which is an increase slightly from last visit. We increased her Glipizide XL to 10 mg last visit with no improvement. Will add in Metformin ER 750 mg once daily.  Foot exam today. Pneumonia and eye exams UTD. Managed on Zetia and ARB.  Discussed to start regular exercise. Also check glucose levels at varying times of the day.   Follow up in 3 months for repeat A1C and follow up.

## 2018-05-02 NOTE — Assessment & Plan Note (Signed)
Suspect fatigue to be secondary to caregiver strain and stress. She denies feeling depressed and increased anxious.  Recommended regular exercise and spending time on herself.   Will check back in with patient during her next visit.

## 2018-05-03 ENCOUNTER — Encounter

## 2018-05-03 ENCOUNTER — Ambulatory Visit: Payer: Medicare Other | Admitting: Cardiovascular Disease

## 2018-05-09 ENCOUNTER — Other Ambulatory Visit: Payer: Self-pay | Admitting: Cardiovascular Disease

## 2018-05-25 ENCOUNTER — Other Ambulatory Visit: Payer: Self-pay | Admitting: Primary Care

## 2018-05-25 DIAGNOSIS — E0821 Diabetes mellitus due to underlying condition with diabetic nephropathy: Secondary | ICD-10-CM

## 2018-05-25 MED ORDER — GLIPIZIDE ER 10 MG PO TB24: 10.0000 mg | ORAL_TABLET | Freq: Every day | ORAL | 0 refills | Status: DC

## 2018-05-25 NOTE — Telephone Encounter (Signed)
Copied from [place] [id number]. Topic: Quick Communication - Rx Refill/Question >> May 25, 2018  9:31 AM Judyann Munson wrote: Medication: glipiZIDE (GLUCOTROL XL) 10 MG 24 hr tablet  Has the patient contacted their pharmacy? no  Preferred Pharmacy (with phone number or street name):  [name] [address], [place], [address]  [phone])  Agent: Please be advised that RX refills may take up to 3 business days. We ask that you follow-up with your pharmacy.

## 2018-05-25 NOTE — Telephone Encounter (Signed)
POCT was done 6/4. Medication refilled per request

## 2018-05-25 NOTE — Telephone Encounter (Signed)
Pt was to have repeat labs in 5mos from 12/2017. pls advise

## 2018-05-25 NOTE — Addendum Note (Signed)
Addended by: Lurlean Nanny on: 05/25/2018 01:17 PM   Modules accepted: Orders

## 2018-06-06 ENCOUNTER — Telehealth: Payer: Self-pay | Admitting: Primary Care

## 2018-06-06 MED ORDER — PANTOPRAZOLE SODIUM 40 MG PO TBEC: 40.0000 mg | DELAYED_RELEASE_TABLET | Freq: Every day | ORAL | 3 refills | Status: DC

## 2018-06-06 NOTE — Telephone Encounter (Signed)
Copied from [place] [id number]. Topic: Quick Communication - Rx Refill/Question >> Jun 06, 2018 10:23 AM Selinda Flavin B, NT wrote: Medication: pantoprazole (PROTONIX) 40 MG tablet (30 day supply)  Has the patient contacted their pharmacy? Yes.   (Agent: If no, request that the patient contact the pharmacy for the refill.) (Agent: If yes, when and what did the pharmacy advise?)  Preferred Pharmacy (with phone number or street name): [place], [place]  Agent: Please be advised that RX refills may take up to 3 business days. We ask that you follow-up with your pharmacy.

## 2018-06-14 ENCOUNTER — Telehealth: Payer: Self-pay | Admitting: Primary Care

## 2018-06-14 NOTE — Telephone Encounter (Signed)
Copied from [place]. Topic: Quick Communication - Rx Refill/Question >> Jun 14, 2018  2:54 PM Yvette Rack wrote: Medication: Contour Next test strips 100 strips  only  for the Contour Next EZ machine  Has the patient contacted their pharmacy? Yes.   (Agent: If no, request that the patient contact the pharmacy for the refill.) (Agent: If yes, when and what did the pharmacy advise?)  pt called today and was advised to call provider  Preferred Pharmacy (with phone number or street name):     [place], [place] [phone] (Phone) [phone] (Fax)      Agent: Please be advised that RX refills may take up to 3 business days. We ask that you follow-up with your pharmacy.

## 2018-06-15 NOTE — Telephone Encounter (Signed)
Patient called on both numbers listed, left VM to return call to the office to verify the type of test strips needed, One Touch is listed on the medication profile.

## 2018-06-15 NOTE — Telephone Encounter (Signed)
Pt states she needs [place] Contour Next test strips. She no longer has One Touch.

## 2018-06-15 NOTE — Telephone Encounter (Signed)
Patient is not at home- on her way to appointment- message left for her to return call when she returns home. Verify meter to let office know if they need to remove the One Touch from list.

## 2018-06-16 MED ORDER — GLUCOSE BLOOD VI STRP: ORAL_STRIP | 2 refills | Status: DC

## 2018-06-16 NOTE — Addendum Note (Signed)
Addended by: Jacqualin Combes on: 06/16/2018 09:32 AM   Modules accepted: Orders

## 2018-06-29 ENCOUNTER — Encounter: Payer: Self-pay | Admitting: Primary Care

## 2018-06-29 ENCOUNTER — Ambulatory Visit: Payer: Medicare Other | Admitting: Primary Care

## 2018-06-29 DIAGNOSIS — R202 Paresthesia of skin: Secondary | ICD-10-CM | POA: Insufficient documentation

## 2018-06-29 MED ORDER — BLOOD GLUCOSE METER KIT: PACK | 0 refills | Status: DC

## 2018-06-29 NOTE — Progress Notes (Signed)
Subjective:    Patient ID: [name] [name], female    DOB: [birth year], 84 y.o.   MRN: [id number]  HPI  [name] [name] is an 84 year old female with a history of lumbar back pain, type 2 diabetes, hypertension who presents today with a chief complaint of right lateral chest skin discomfort.   This has been present intermittently since April 2019. She describes the discomfort as a pin needle sticking. Episodes will occur 1-2 times monthly and will last a few hours. Her last episode was this Sunday. Her symptoms are never bad enough to take anything. She denies seeing a rash, denies breast pain/lumps/skin texture changes. She has a history of shingles, this doesn't feel similar.   Review of Systems  Cardiovascular: Negative for chest pain.  Skin: Negative for rash.  Neurological:       Pin pricks to right lateral upper trunk       Past Medical History:  Diagnosis Date  . Allergy    Cipro, Plavix, Statins  . CAD (coronary artery disease)    a. s/p tandem Promus DES to LAD in 2009 by Dr. Olevia Perches;  b.  LHC (03/22/14):  prox LAD 30%, mid LAD 40%, LAD stent ok with dist 20% ISR, apical LAD occluded with L-L collats filling apical vessel (too small for PCI), mid RCA 20%, EF 70%.  Med Rx  . Diabetes mellitus    Diagnosed 2012  . Diverticulosis of colon (without mention of hemorrhage)   . GERD (gastroesophageal reflux disease)   . Hx of cardiovascular stress test    a. Nuclear (08/2013):  No ischemia, EF 80%, Normal  . Hx of echocardiogram    a. Echo (07/2013):  Mild LVH, vigorous LVF, EF 65-70%, Gr 1 DD, mild MR, mild to mod LAE  . Hyperlipidemia    intol of statins  . Hypertension   . NHL (non-Hodgkin's lymphoma) ([place]) dx'd 2003   chemo/xrt comp 2003  . Personal history of colonic polyps   . Proctitis   . Urticaria      Social History   Socioeconomic History  . Marital status: Married    Spouse name: Not on file  . Number of children: Not on file  . Years of education: Not on file  .  Highest education level: Not on file  Occupational History  . Occupation: Retired    Fish farm manager: RETIRED  Social Needs  . Financial resource strain: Not on file  . Food insecurity:    Worry: Not on file    Inability: Not on file  . Transportation needs:    Medical: Not on file    Non-medical: Not on file  Tobacco Use  . Smoking status: Never Smoker  . Smokeless tobacco: Never Used  Substance and Sexual Activity  . Alcohol use: No  . Drug use: No  . Sexual activity: Never  Lifestyle  . Physical activity:    Days per week: Not on file    Minutes per session: Not on file  . Stress: Not on file  Relationships  . Social connections:    Talks on phone: Not on file    Gets together: Not on file    Attends religious service: Not on file    Active member of club or organization: Not on file    Attends meetings of clubs or organizations: Not on file    Relationship status: Not on file  . Intimate partner violence:    Fear of current or ex partner:  Not on file    Emotionally abused: Not on file    Physically abused: Not on file    Forced sexual activity: Not on file  Other Topics Concern  . Not on file  Social History Narrative  . Not on file    Past Surgical History:  Procedure Laterality Date  . cardiac cath-neg    . CORONARY ANGIOPLASTY WITH STENT PLACEMENT     x 2  . dexa-neg    . KNEE SURGERY  10/2003   left  . laser surgery for cataracts-left    . LEFT HEART CATHETERIZATION WITH CORONARY ANGIOGRAM N/A 03/22/2014   Procedure: LEFT HEART CATHETERIZATION WITH CORONARY ANGIOGRAM;  Surgeon: Burnell Blanks, MD;  Location: [facility] CATH LAB;  Service: Cardiovascular;  Laterality: N/A;  . stress cardiolite    . VAGINAL HYSTERECTOMY     partial, fibroids, one ovary left    Family History  Problem Relation Age of Onset  . Cancer Mother        jaw  . Hypertension Father   . Heart attack Father   . Heart attack Sister   . Colon cancer Neg Hx     Allergies  Allergen  Reactions  . Other Rash and Anaphylaxis  . Shellfish Allergy Anaphylaxis and Rash  . Cephalexin     REACTION: trash and swelling REACTION: trash and swelling  . Ciprofloxacin     REACTION: u/k  . Clopidogrel Bisulfate     REACTION: swelling, rash  . Codeine Phosphate     REACTION: unspecified  . Ezetimibe-Simvastatin     REACTION: myalgias REACTION: myalgias  . Hydrocod Polst-Cpm Polst Er     REACTION: u/k  . Lidocaine Hives    ONLY TO ADHESIVE LIDOCAINE PATCHES. NO PROBLEM WITH INJECTABLE LIDOCAINE.  Marland Kitchen Nitrofurantoin     REACTION: [place] REACTION: [place]  . Pregabalin     REACTION: felt bad REACTION: felt bad  . Propoxyphene N-Acetaminophen     REACTION: [place]  . Rofecoxib     unk unk   . Sulfamethoxazole     REACTION: unspecified  . Sulfonamide Derivatives     REACTION: unspecified  . Celecoxib Rash    REACTION: [place] REACTION: [place]  . Codeine Rash    REACTION: [place] REACTION: [place]  . Hydrocod Polst-Cpm Polst Er Rash  . Propoxyphene Rash  . Sulfa Antibiotics Rash    REACTION: unspecified REACTION: unspecified    Current Outpatient Medications on File Prior to Visit  Medication Sig Dispense Refill  . aspirin (ASPIRIN LOW DOSE) 81 MG tablet Take 81 mg by mouth daily.      . brimonidine-timolol (COMBIGAN) 0.2-0.5 % ophthalmic solution Place 1 drop into both eyes every 12 (twelve) hours.    . Cholecalciferol (VITAMIN D-3) 1000 UNITS CAPS Take 1 capsule by mouth daily.    Marland Kitchen ezetimibe (ZETIA) 10 MG tablet TAKE ONE TABLET BY MOUTH EVERY DAY 90 tablet 2  . fexofenadine (ALLEGRA) 180 MG tablet Take 1 tablet (180 mg total) by mouth daily. 30 tablet 2  . fluticasone (FLONASE) 50 MCG/ACT nasal spray Place 2 sprays into both nostrils daily. 16 g 6  . glipiZIDE (GLUCOTROL XL) 10 MG 24 hr tablet Take 1 tablet (10 mg total) by mouth daily with breakfast. 90 tablet 0  . glucose blood test strip Contour Next Blood Glucose Test Strips Use as instructed to test blood sugar 2 times daily 100 each 2    . isosorbide mononitrate (IMDUR) 60 MG 24 hr tablet TAKE  ONE TABLET BY MOUTH EVERY DAY 90 tablet 1  . losartan (COZAAR) 50 MG tablet TAKE ONE TABLET BY MOUTH EVERY DAY 90 tablet 2  . metFORMIN (GLUCOPHAGE-XR) 750 MG 24 hr tablet Take 1 tablet by mouth with breakfast for diabetes. 90 tablet 1  . metoprolol succinate (TOPROL-XL) 100 MG 24 hr tablet TAKE ONE TABLET BY MOUTH EVERY MORNING WITH OR IMMEDIATELY FOLLOWING MEAL 90 tablet 1  . naproxen (NAPROSYN) 500 MG tablet Take 1 tablet (500 mg total) by mouth 2 (two) times daily as needed for moderate pain. 30 tablet 0  . nitroGLYCERIN (NITROSTAT) 0.4 MG SL tablet Place 1 tablet (0.4 mg total) under the tongue every 5 (five) minutes as needed for chest pain. 26 tablet 6  . OVER THE COUNTER MEDICATION Take 1 tablet by mouth daily. Med name: VISION FORMULA    . pantoprazole (PROTONIX) 40 MG tablet Take 1 tablet (40 mg total) by mouth daily. 30 tablet 3  . timolol (TIMOPTIC) 0.5 % ophthalmic solution     . trimethoprim (TRIMPEX) 100 MG tablet Take 100 mg by mouth 2 (two) times daily as needed (UTI).     . vitamin B-12 (CYANOCOBALAMIN) 1000 MCG tablet Take 1,000 mcg by mouth daily.    . cyclobenzaprine (FLEXERIL) 5 MG tablet Take 1 tablet (5 mg total) by mouth at bedtime. (Patient not taking: Reported on 05/02/2018) 15 tablet 0  . [DISCONTINUED] loratadine (CLARITIN) 10 MG tablet Take 10 mg by mouth as needed.      No current facility-administered medications on file prior to visit.     BP 124/74   Pulse 76   Temp 98.2 F (36.8 C) (Oral)   Ht 5\' 8"  (1.727 m)   Wt 168 lb 12 oz (76.5 kg)   SpO2 98%   BMI 25.66 kg/m    Objective:   Physical Exam  Constitutional: She appears well-nourished.  Musculoskeletal: Normal range of motion.  Skin: Skin is warm and dry. No erythema.  Several benign appearing nevi. No rash. Skin is non tender.           Assessment & Plan:

## 2018-06-29 NOTE — Assessment & Plan Note (Signed)
Located to right upper lateral trunk intermittently x 3 months. Mammogram in April 2019 unremarkable. No rash or any obvious herpes zoster.  Sounds to be nerve involvement, could be from diabetes although she is well controlled. Could be due to stress.  Discussed options including PRN gabapentin vs NSAID's. She will continue to monitor symptoms and update if they progress.

## 2018-06-29 NOTE — Patient Instructions (Signed)
Please notify me when you notice those symptoms.  Try taking Ibuprofen or naproxen when the symptoms start. Please call me immediately if you notice a rash.  It was a pleasure to see you today!

## 2018-07-18 ENCOUNTER — Ambulatory Visit (INDEPENDENT_AMBULATORY_CARE_PROVIDER_SITE_OTHER): Payer: Medicare Other

## 2018-07-18 ENCOUNTER — Encounter: Payer: Self-pay | Admitting: Podiatry

## 2018-07-18 ENCOUNTER — Ambulatory Visit: Payer: Medicare Other | Admitting: Podiatry

## 2018-07-18 ENCOUNTER — Ambulatory Visit: Payer: Medicare Other | Admitting: Primary Care

## 2018-07-18 DIAGNOSIS — M722 Plantar fascial fibromatosis: Secondary | ICD-10-CM

## 2018-07-20 NOTE — Progress Notes (Signed)
   Subjective: 84 year old female presenting today with a chief complaint of pain to the plantar aspect of the left heel that began 3 weeks ago. She reports associated pain in the arch of the foot. Walking or stretching makes the pain worse. She states the pain is the worst when she first wakes up in the morning. She has taken Tylenol and Motrin for treatment. Patient is here for further evaluation and treatment.   Past Medical History:  Diagnosis Date  . Allergy    Cipro, Plavix, Statins  . CAD (coronary artery disease)    a. s/p tandem Promus DES to LAD in 2009 by Dr. Olevia Perches;  b.  LHC (03/22/14):  prox LAD 30%, mid LAD 40%, LAD stent ok with dist 20% ISR, apical LAD occluded with L-L collats filling apical vessel (too small for PCI), mid RCA 20%, EF 70%.  Med Rx  . Diabetes mellitus    Diagnosed 2012  . Diverticulosis of colon (without mention of hemorrhage)   . GERD (gastroesophageal reflux disease)   . Hx of cardiovascular stress test    a. Nuclear (08/2013):  No ischemia, EF 80%, Normal  . Hx of echocardiogram    a. Echo (07/2013):  Mild LVH, vigorous LVF, EF 65-70%, Gr 1 DD, mild MR, mild to mod LAE  . Hyperlipidemia    intol of statins  . Hypertension   . NHL (non-Hodgkin's lymphoma) ([place]) dx'd 2003   chemo/xrt comp 2003  . Personal history of colonic polyps   . Proctitis   . Urticaria      Objective: Physical Exam General: The patient is alert and oriented x3 in no acute distress.  Dermatology: Skin is warm, dry and supple bilateral lower extremities. Negative for open lesions or macerations bilateral.   Vascular: Dorsalis Pedis and Posterior Tibial pulses palpable bilateral.  Capillary fill time is immediate to all digits.  Neurological: Epicritic and protective threshold intact bilateral.   Musculoskeletal: Tenderness to palpation to the plantar aspect of the left heel along the plantar fascia. All other joints range of motion within normal limits bilateral. Strength  5/5 in all groups bilateral.   Radiographic exam:   Normal osseous mineralization. Joint spaces preserved. No fracture/dislocation/boney destruction. No other soft tissue abnormalities or radiopaque foreign bodies.   Assessment: 1. Plantar fasciitis left foot  Plan of Care:  1. Patient evaluated. Xrays reviewed.   2. Injection of 0.5cc Celestone soluspan injected into the left plantar fascia.  3. Plantar fascial band(s) dispensed  4. Continue taking OTC Motrin and Tylenol.  5. Instructed patient regarding therapies and modalities at home to alleviate symptoms.  6. Return to clinic in 4 weeks.     Edrick Kins, DPM [facility]  Dr. Edrick Kins, DPM    [address], [place] [address]                Office [phone]  Fax [phone]

## 2018-08-02 ENCOUNTER — Ambulatory Visit: Payer: Medicare Other | Admitting: Primary Care

## 2018-08-02 ENCOUNTER — Encounter: Payer: Self-pay | Admitting: Primary Care

## 2018-08-02 VITALS — BP 124/72 | HR 64 | Temp 98.2°F | Ht 68.0 in | Wt 169.0 lb

## 2018-08-02 DIAGNOSIS — F5109 Other insomnia not due to a substance or known physiological condition: Secondary | ICD-10-CM | POA: Diagnosis not present

## 2018-08-02 DIAGNOSIS — G4700 Insomnia, unspecified: Secondary | ICD-10-CM | POA: Diagnosis not present

## 2018-08-02 DIAGNOSIS — Z23 Encounter for immunization: Secondary | ICD-10-CM

## 2018-08-02 DIAGNOSIS — Z6379 Other stressful life events affecting family and household: Secondary | ICD-10-CM | POA: Diagnosis not present

## 2018-08-02 DIAGNOSIS — R202 Paresthesia of skin: Secondary | ICD-10-CM

## 2018-08-02 DIAGNOSIS — I2510 Atherosclerotic heart disease of native coronary artery without angina pectoris: Secondary | ICD-10-CM

## 2018-08-02 DIAGNOSIS — E118 Type 2 diabetes mellitus with unspecified complications: Secondary | ICD-10-CM | POA: Diagnosis not present

## 2018-08-02 DIAGNOSIS — M542 Cervicalgia: Secondary | ICD-10-CM

## 2018-08-02 LAB — POCT GLYCOSYLATED HEMOGLOBIN (HGB A1C): HEMOGLOBIN A1C: 6.7 % — AB (ref 4.0–5.6)

## 2018-08-02 MED ORDER — NITROGLYCERIN 0.4 MG SL SUBL: 0.4000 mg | SUBLINGUAL_TABLET | SUBLINGUAL | 0 refills | Status: DC | PRN

## 2018-08-02 NOTE — Assessment & Plan Note (Signed)
Much improved, will continue to monitor.

## 2018-08-02 NOTE — Addendum Note (Signed)
Addended by: Jacqualin Combes on: 08/02/2018 02:13 PM   Modules accepted: Orders

## 2018-08-02 NOTE — Patient Instructions (Addendum)
Stop by the lab prior to leaving today. I will notify you of your results once received.   You can try Melatonin 5 mg tablets for difficulty sleeping. Do not exceed 10 mg in 24 hours. Please update me if no improvement.  Continue Metformin and Glipizide.   Be sure to watch your diet as discussed.  Please schedule a follow up appointment in 6 months for general follow up.  It was a pleasure to see you today!

## 2018-08-02 NOTE — Assessment & Plan Note (Deleted)
Chronic, no difficulty falling asleep, does wake up at 2 am with inability to return to sleep. Is the primary caregiver of her husband, and now is helping to care for her sister who has cancer.  Will have her start with OTC Melatonin 5 mg, she will update.

## 2018-08-02 NOTE — Progress Notes (Signed)
Subjective:    Patient ID: [name] [name], female    DOB: Oct 22, 1932, 84 y.o.   MRN: [id number]  HPI  [name] [name] is an 84 year old female who presents today for follow up.  1) Type 2 Diabetes:   Current medications include: Glipizide XL 10 mg, Metformin ER 750 mg. Has noticed some diarrhea with metformin, overall tolerable.   She is checking her blood glucose 2 times daily and is getting readings of: AM fasting: 80-120's 2 hours after dinner: 120-180  Last A1C: 8.7 in June 2019 Last Eye Exam: UTD Last Foot Exam: Completed Pneumonia Vaccination: Up to date ACE/ARB: ARB Statin: No statin, Zetia  Diet currently consists of:  Breakfast: Toast, cereal, oatmeal, eggs, bacon Lunch: Sandwich Dinner: Meat, vegetables, starch Snacks: Occasionally nuts Desserts: Several times weekly (pie, cake, peanut butter with graham crackers) Beverages: Water, coffee, seldom sweet tea  2) Neck Pain: Located to the left lateral neck that has been intermittent for the last 2 months. She'll turn her neck a "certain way" and will notice a cramping. She will rub the side of her neck with improvement. She denies radiation of pain to her upper extremities, and back; no numbness/tingling.   3) Insomnia: Chronic. Difficulty staying asleep, doesn't have trouble falling asleep. Has not taken anything OTC or Rx for symptoms in the past. She is under stress caring for her husband who has advanced Parkinson's Disease.    Review of Systems  Eyes: Negative for visual disturbance.  Respiratory: Negative for shortness of breath.   Cardiovascular: Negative for chest pain.  Musculoskeletal: Positive for neck pain.  Neurological: Negative for headaches.  Psychiatric/Behavioral:       Overall feeling better during this visit with caregiver strain       Past Medical History:  Diagnosis Date  . Allergy    Cipro, Plavix, Statins  . CAD (coronary artery disease)    a. s/p tandem Promus DES to LAD in 2009 by Dr.  Olevia Perches;  b.  LHC (03/22/14):  prox LAD 30%, mid LAD 40%, LAD stent ok with dist 20% ISR, apical LAD occluded with L-L collats filling apical vessel (too small for PCI), mid RCA 20%, EF 70%.  Med Rx  . Diabetes mellitus    Diagnosed 2012  . Diverticulosis of colon (without mention of hemorrhage)   . GERD (gastroesophageal reflux disease)   . Hx of cardiovascular stress test    a. Nuclear (08/2013):  No ischemia, EF 80%, Normal  . Hx of echocardiogram    a. Echo (07/2013):  Mild LVH, vigorous LVF, EF 65-70%, Gr 1 DD, mild MR, mild to mod LAE  . Hyperlipidemia    intol of statins  . Hypertension   . NHL (non-Hodgkin's lymphoma) ([place]) dx'd 2003   chemo/xrt comp 2003  . Personal history of colonic polyps   . Proctitis   . Urticaria      Social History   Socioeconomic History  . Marital status: Married    Spouse name: Not on file  . Number of children: Not on file  . Years of education: Not on file  . Highest education level: Not on file  Occupational History  . Occupation: Retired    Fish farm manager: RETIRED  Social Needs  . Financial resource strain: Not on file  . Food insecurity:    Worry: Not on file    Inability: Not on file  . Transportation needs:    Medical: Not on file    Non-medical: Not  on file  Tobacco Use  . Smoking status: Never Smoker  . Smokeless tobacco: Never Used  Substance and Sexual Activity  . Alcohol use: No  . Drug use: No  . Sexual activity: Never  Lifestyle  . Physical activity:    Days per week: Not on file    Minutes per session: Not on file  . Stress: Not on file  Relationships  . Social connections:    Talks on phone: Not on file    Gets together: Not on file    Attends religious service: Not on file    Active member of club or organization: Not on file    Attends meetings of clubs or organizations: Not on file    Relationship status: Not on file  . Intimate partner violence:    Fear of current or ex partner: Not on file    Emotionally  abused: Not on file    Physically abused: Not on file    Forced sexual activity: Not on file  Other Topics Concern  . Not on file  Social History Narrative  . Not on file    Past Surgical History:  Procedure Laterality Date  . cardiac cath-neg    . CORONARY ANGIOPLASTY WITH STENT PLACEMENT     x 2  . dexa-neg    . KNEE SURGERY  10/2003   left  . laser surgery for cataracts-left    . LEFT HEART CATHETERIZATION WITH CORONARY ANGIOGRAM N/A 03/22/2014   Procedure: LEFT HEART CATHETERIZATION WITH CORONARY ANGIOGRAM;  Surgeon: Burnell Blanks, MD;  Location: [facility] CATH LAB;  Service: Cardiovascular;  Laterality: N/A;  . stress cardiolite    . VAGINAL HYSTERECTOMY     partial, fibroids, one ovary left    Family History  Problem Relation Age of Onset  . Cancer Mother        jaw  . Hypertension Father   . Heart attack Father   . Heart attack Sister   . Colon cancer Neg Hx     Allergies  Allergen Reactions  . Other Rash and Anaphylaxis  . Shellfish Allergy Anaphylaxis and Rash  . Cephalexin     REACTION: trash and swelling REACTION: trash and swelling  . Ciprofloxacin     REACTION: u/k  . Clopidogrel Bisulfate     REACTION: swelling, rash  . Codeine Phosphate     REACTION: unspecified  . Ezetimibe-Simvastatin     REACTION: myalgias REACTION: myalgias  . Hydrocod Polst-Cpm Polst Er     REACTION: u/k  . Lidocaine Hives    ONLY TO ADHESIVE LIDOCAINE PATCHES. NO PROBLEM WITH INJECTABLE LIDOCAINE.  Marland Kitchen Nitrofurantoin     REACTION: [place] REACTION: [place]  . Pregabalin     REACTION: felt bad REACTION: felt bad  . Propoxyphene N-Acetaminophen     REACTION: [place]  . Rofecoxib     unk unk   . Sulfamethoxazole     REACTION: unspecified  . Sulfonamide Derivatives     REACTION: unspecified  . Celecoxib Rash    REACTION: [place] REACTION: [place]  . Codeine Rash    REACTION: [place] REACTION: [place]  . Hydrocod Polst-Cpm Polst Er Rash  . Propoxyphene Rash  . Sulfa Antibiotics Rash     REACTION: unspecified REACTION: unspecified    Current Outpatient Medications on File Prior to Visit  Medication Sig Dispense Refill  . aspirin (ASPIRIN LOW DOSE) 81 MG tablet Take 81 mg by mouth daily.      . blood glucose meter kit  and supplies Dispense based on patient and insurance preference. Use up to times times daily as directed. (FOR ICD-10 E10.9, E11.9). 1 each 0  . brimonidine-timolol (COMBIGAN) 0.2-0.5 % ophthalmic solution Place 1 drop into both eyes every 12 (twelve) hours.    . Cholecalciferol (VITAMIN D-3) 1000 UNITS CAPS Take 1 capsule by mouth daily.    . cyclobenzaprine (FLEXERIL) 5 MG tablet Take 1 tablet (5 mg total) by mouth at bedtime. 15 tablet 0  . ezetimibe (ZETIA) 10 MG tablet TAKE ONE TABLET BY MOUTH EVERY DAY 90 tablet 2  . fexofenadine (ALLEGRA) 180 MG tablet Take 1 tablet (180 mg total) by mouth daily. 30 tablet 2  . fluticasone (FLONASE) 50 MCG/ACT nasal spray Place 2 sprays into both nostrils daily. 16 g 6  . glipiZIDE (GLUCOTROL XL) 10 MG 24 hr tablet Take 1 tablet (10 mg total) by mouth daily with breakfast. 90 tablet 0  . glucose blood test strip Contour Next Blood Glucose Test Strips Use as instructed to test blood sugar 2 times daily 100 each 2  . isosorbide mononitrate (IMDUR) 60 MG 24 hr tablet TAKE ONE TABLET BY MOUTH EVERY DAY 90 tablet 1  . losartan (COZAAR) 50 MG tablet TAKE ONE TABLET BY MOUTH EVERY DAY 90 tablet 2  . metFORMIN (GLUCOPHAGE-XR) 750 MG 24 hr tablet Take 1 tablet by mouth with breakfast for diabetes. 90 tablet 1  . metoprolol succinate (TOPROL-XL) 100 MG 24 hr tablet TAKE ONE TABLET BY MOUTH EVERY MORNING WITH OR IMMEDIATELY FOLLOWING MEAL 90 tablet 1  . naproxen (NAPROSYN) 500 MG tablet Take 1 tablet (500 mg total) by mouth 2 (two) times daily as needed for moderate pain. 30 tablet 0  . OVER THE COUNTER MEDICATION Take 1 tablet by mouth daily. Med name: VISION FORMULA    . pantoprazole (PROTONIX) 40 MG tablet Take 1 tablet (40 mg  total) by mouth daily. 30 tablet 3  . timolol (TIMOPTIC) 0.5 % ophthalmic solution     . trimethoprim (TRIMPEX) 100 MG tablet Take 100 mg by mouth 2 (two) times daily as needed (UTI).     . vitamin B-12 (CYANOCOBALAMIN) 1000 MCG tablet Take 1,000 mcg by mouth daily.    . [DISCONTINUED] loratadine (CLARITIN) 10 MG tablet Take 10 mg by mouth as needed.      No current facility-administered medications on file prior to visit.     BP 124/72   Pulse 64   Temp 98.2 F (36.8 C) (Oral)   Ht _0  (1.727 m)   Wt 169 lb (76.7 kg)   SpO2 94%   BMI 25.70 kg/m    Objective:   Physical Exam  Constitutional: She appears well-nourished.  Neck: Neck supple.  Cardiovascular: Normal rate and regular rhythm.  Respiratory: Effort normal and breath sounds normal.  Musculoskeletal:       Cervical back: She exhibits normal range of motion and no bony tenderness.       Back:  Skin: Skin is warm and dry.  Psychiatric: She has a normal mood and affect.           Assessment & Plan:

## 2018-08-02 NOTE — Assessment & Plan Note (Signed)
Overall okay, now with sister who was recently diagnosed with cancer. Will continue to monitor. Will have her start Melatonin HS for sleep. She will update.

## 2018-08-02 NOTE — Assessment & Plan Note (Signed)
Repeat A1C much improved at 6.7. Continue Glipizide XL 10 mg and Metformin ER 750 mg. Foot and eye exams UTD. Managed on ARB. Pneumonia vaccination UTD.  Follow up in 6 months.

## 2018-08-02 NOTE — Assessment & Plan Note (Signed)
Chronic, no difficulty falling asleep, does wake up at 2 am with inability to return to sleep. Is the primary caregiver of her husband, and now is helping to care for her sister who has cancer.  Will have her start with OTC Melatonin 5 mg, she will update.

## 2018-08-02 NOTE — Assessment & Plan Note (Signed)
Located to left lateral neck x 2 months, sounds to be MSK spasm. Offered further work up including imaging, she kindly declines as the symptoms aren't too bothersome and will dissipate with massage. She will update.

## 2018-08-03 NOTE — Progress Notes (Signed)
4  Chief Complaint  Patient presents with  . Follow-up    CAD   History of Present Illness: 84 yo female with history of CAD, HLD, HTN, non-Hodgkin's lymphoma, DM here today for cardiac follow up. In 2009 she had tandem non-overlapping drug-eluting stents placed in the LAD. Last cardiac cath April 2015 with patent LAD stents, occluded apical LAD segment with left to left collaterals and non-obstructive disease elsewhere. She has been intolerant to statins and Plavix in the past. She has been on Zetia. Echo 01/20/16 with normal LV size and function, trivial aortic valve insufficiency. She has had bilateral foot pain and normal ABI in 2016. Last nuclear stress test October 2017 with no ischemia. She has stable angina controlled with Imdur.    She is here today for follow up. The patient denies any chest pain, dyspnea, palpitations, lower extremity edema, orthopnea, PND, dizziness, near syncope or syncope. Rare chest pains that respond to NTG.   Primary Care Physician: Pleas Koch, NP   Past Medical History:  Diagnosis Date  . Allergy    Cipro, Plavix, Statins  . CAD (coronary artery disease)    a. s/p tandem Promus DES to LAD in 2009 by Dr. Olevia Perches;  b.  LHC (03/22/14):  prox LAD 30%, mid LAD 40%, LAD stent ok with dist 20% ISR, apical LAD occluded with L-L collats filling apical vessel (too small for PCI), mid RCA 20%, EF 70%.  Med Rx  . Diabetes mellitus    Diagnosed 2012  . Diverticulosis of colon (without mention of hemorrhage)   . GERD (gastroesophageal reflux disease)   . Hx of cardiovascular stress test    a. Nuclear (08/2013):  No ischemia, EF 80%, Normal  . Hx of echocardiogram    a. Echo (07/2013):  Mild LVH, vigorous LVF, EF 65-70%, Gr 1 DD, mild MR, mild to mod LAE  . Hyperlipidemia    intol of statins  . Hypertension   . NHL (non-Hodgkin's lymphoma) ([place]) dx'd 2003   chemo/xrt comp 2003  . Personal history of colonic polyps   . Proctitis   . Urticaria     Past  Surgical History:  Procedure Laterality Date  . cardiac cath-neg    . CORONARY ANGIOPLASTY WITH STENT PLACEMENT     x 2  . dexa-neg    . KNEE SURGERY  10/2003   left  . laser surgery for cataracts-left    . LEFT HEART CATHETERIZATION WITH CORONARY ANGIOGRAM N/A 03/22/2014   Procedure: LEFT HEART CATHETERIZATION WITH CORONARY ANGIOGRAM;  Surgeon: Burnell Blanks, MD;  Location: [facility] CATH LAB;  Service: Cardiovascular;  Laterality: N/A;  . stress cardiolite    . VAGINAL HYSTERECTOMY     partial, fibroids, one ovary left    Current Outpatient Medications  Medication Sig Dispense Refill  . aspirin (ASPIRIN LOW DOSE) 81 MG tablet Take 81 mg by mouth daily.      . blood glucose meter kit and supplies Dispense based on patient and insurance preference. Use up to times times daily as directed. (FOR ICD-10 E10.9, E11.9). 1 each 0  . brimonidine-timolol (COMBIGAN) 0.2-0.5 % ophthalmic solution Place 1 drop into both eyes every 12 (twelve) hours.    . Cholecalciferol (VITAMIN D-3) 1000 UNITS CAPS Take 1 capsule by mouth daily.    . cyclobenzaprine (FLEXERIL) 5 MG tablet Take 5 mg by mouth as needed for muscle spasms.    Marland Kitchen ezetimibe (ZETIA) 10 MG tablet TAKE ONE TABLET BY MOUTH EVERY DAY  90 tablet 2  . fexofenadine (ALLEGRA) 180 MG tablet Take 180 mg by mouth as needed for allergies or rhinitis.    . fluticasone (FLONASE) 50 MCG/ACT nasal spray Place 2 sprays into both nostrils as needed for allergies or rhinitis.    Marland Kitchen glipiZIDE (GLUCOTROL XL) 10 MG 24 hr tablet Take 1 tablet (10 mg total) by mouth daily with breakfast. 90 tablet 0  . glucose blood test strip Contour Next Blood Glucose Test Strips Use as instructed to test blood sugar 2 times daily 100 each 2  . isosorbide mononitrate (IMDUR) 60 MG 24 hr tablet TAKE ONE TABLET BY MOUTH EVERY DAY 90 tablet 1  . losartan (COZAAR) 50 MG tablet TAKE ONE TABLET BY MOUTH EVERY DAY 90 tablet 2  . metFORMIN (GLUCOPHAGE-XR) 750 MG 24 hr tablet Take 1  tablet by mouth with breakfast for diabetes. 90 tablet 1  . metoprolol succinate (TOPROL-XL) 100 MG 24 hr tablet TAKE ONE TABLET BY MOUTH EVERY MORNING WITH OR IMMEDIATELY FOLLOWING MEAL 90 tablet 1  . nitroGLYCERIN (NITROSTAT) 0.4 MG SL tablet Place 1 tablet (0.4 mg total) under the tongue every 5 (five) minutes as needed for chest pain. 26 tablet 0  . OVER THE COUNTER MEDICATION Take 1 tablet by mouth daily. Med name: VISION FORMULA    . pantoprazole (PROTONIX) 40 MG tablet Take 1 tablet (40 mg total) by mouth daily. 30 tablet 3  . timolol (TIMOPTIC) 0.5 % ophthalmic solution     . trimethoprim (TRIMPEX) 100 MG tablet Take 100 mg by mouth 2 (two) times daily as needed (UTI).     . vitamin B-12 (CYANOCOBALAMIN) 1000 MCG tablet Take 1,000 mcg by mouth daily.     No current facility-administered medications for this visit.     Allergies  Allergen Reactions  . Other Rash and Anaphylaxis  . Shellfish Allergy Anaphylaxis and Rash  . Cephalexin     REACTION: trash and swelling REACTION: trash and swelling  . Ciprofloxacin     REACTION: u/k  . Clopidogrel Bisulfate     REACTION: swelling, rash  . Codeine Phosphate     REACTION: unspecified  . Ezetimibe-Simvastatin     REACTION: myalgias REACTION: myalgias  . Hydrocod Polst-Cpm Polst Er     REACTION: u/k  . Lidocaine Hives    ONLY TO ADHESIVE LIDOCAINE PATCHES. NO PROBLEM WITH INJECTABLE LIDOCAINE.  Marland Kitchen Nitrofurantoin     REACTION: [place] REACTION: [place]  . Pregabalin     REACTION: felt bad REACTION: felt bad  . Propoxyphene N-Acetaminophen     REACTION: [place]  . Rofecoxib     unk unk   . Sulfamethoxazole     REACTION: unspecified  . Sulfonamide Derivatives     REACTION: unspecified  . Celecoxib Rash    REACTION: [place] REACTION: [place]  . Codeine Rash    REACTION: [place] REACTION: [place]  . Hydrocod Polst-Cpm Polst Er Rash  . Propoxyphene Rash  . Sulfa Antibiotics Rash    REACTION: unspecified REACTION: unspecified    Social History    Socioeconomic History  . Marital status: Married    Spouse name: Not on file  . Number of children: Not on file  . Years of education: Not on file  . Highest education level: Not on file  Occupational History  . Occupation: Retired    Fish farm manager: RETIRED  Social Needs  . Financial resource strain: Not on file  . Food insecurity:    Worry: Not on file  Inability: Not on file  . Transportation needs:    Medical: Not on file    Non-medical: Not on file  Tobacco Use  . Smoking status: Never Smoker  . Smokeless tobacco: Never Used  Substance and Sexual Activity  . Alcohol use: No  . Drug use: No  . Sexual activity: Never  Lifestyle  . Physical activity:    Days per week: Not on file    Minutes per session: Not on file  . Stress: Not on file  Relationships  . Social connections:    Talks on phone: Not on file    Gets together: Not on file    Attends religious service: Not on file    Active member of club or organization: Not on file    Attends meetings of clubs or organizations: Not on file    Relationship status: Not on file  . Intimate partner violence:    Fear of current or ex partner: Not on file    Emotionally abused: Not on file    Physically abused: Not on file    Forced sexual activity: Not on file  Other Topics Concern  . Not on file  Social History Narrative  . Not on file    Family History  Problem Relation Age of Onset  . Cancer Mother        jaw  . Hypertension Father   . Heart attack Father   . Heart attack Sister   . Colon cancer Neg Hx     Review of Systems:  As stated in the HPI and otherwise negative.   BP 112/70   Pulse (!) 57   Ht 5' 8"  (1.727 m)   Wt 169 lb 12.8 oz (77 kg)   BMI 25.82 kg/m   Physical Examination:  General: Well developed, well nourished, NAD  HEENT: OP clear, mucus membranes moist  SKIN: warm, dry. No rashes. Neuro: No focal deficits  Musculoskeletal: Muscle strength 5/5 all ext  Psychiatric: Mood and affect  normal  Neck: No JVD, no carotid bruits, no thyromegaly, no lymphadenopathy.  Lungs:Clear bilaterally, no wheezes, rhonci, crackles Cardiovascular: Regular rate and rhythm. No murmurs, gallops or rubs. Abdomen:Soft. Bowel sounds present. Non-tender.  Extremities: No lower extremity edema. Pulses are 2 + in the bilateral DP/PT.  Echo 01/20/16: Left ventricle: The cavity size was normal. Wall thickness was   increased in a pattern of mild LVH. There was mild focal basal   hypertrophy of the septum. Systolic function was normal. The   estimated ejection fraction was in the range of 60% to 65%. Wall   motion was normal; there were no regional wall motion   abnormalities. Doppler parameters are consistent with abnormal   left ventricular relaxation (grade 1 diastolic dysfunction).   Doppler parameters are consistent with high ventricular filling   pressure. - Aortic valve: There was trivial regurgitation. - Mitral valve: Calcified annulus.  Cardiac cath 03/22/14: Left main: No obstructive disease.  Left Anterior Descending Artery: Large caliber vessel that courses to the apex. 30% proximal stenosis. 40% mid stenosis just prior to the stented segment. Mid stented segment patent without restenosis. Distal stented segment patent with 20% restenosis. Apical LAD becomes very small in caliber and has total occlusion with filling of apical vessel by left to left collaterals. (0.75 mm vessel).  Circumflex Artery: Large caliber vessel with several small OM branches. Diffuse luminal irregularities in the mid vessel.  Right Coronary Artery: Large dominant vessel with 20% mid stenosis.  Left  Ventricular Angiogram: LVEF=70%.   EKG:  EKG is ordered today. The ekg ordered today demonstrates Sinus brady, rate 57 bpm. Q waves inferior leads.   Recent Labs: 01/16/2018: ALT 41; BUN 19; Creatinine, Ser 1.12; Potassium 5.1; Sodium 139; TSH 1.65   Lipid Panel    Component Value Date/Time   CHOL 161  01/16/2018 1034   TRIG 151.0 (H) 01/16/2018 1034   HDL 39.50 01/16/2018 1034   CHOLHDL 4 01/16/2018 1034   VLDL 30.2 01/16/2018 1034   LDLCALC 91 01/16/2018 1034   LDLDIRECT 93.0 08/06/2015 0847     Wt Readings from Last 3 Encounters:  08/04/18 169 lb 12.8 oz (77 kg)  08/02/18 169 lb (76.7 kg)  06/29/18 168 lb 12 oz (76.5 kg)     Other studies Reviewed: Additional studies/ records that were reviewed today include: . Review of the above records demonstrates:    Assessment and Plan:   1. CAD with stable angina: She has stable chronic angina. Will continue ASA, Toprol, Zetia and Imdur. She is statin intolerant.   2. Hyperlipidemia: She is statin intolerant. LDL earlier this year 82. Continue Zetia.   3. HTN: BP is controlled today. No changes   Current medicines are reviewed at length with the patient today.  The patient does not have concerns regarding medicines.  The following changes have been made:  no change  Labs/ tests ordered today include:   Orders Placed This Encounter  Procedures  . EKG 12-Lead    Disposition:   FU with me in 12 months  Signed, Lauree Chandler, MD 08/04/2018 10:27 AM    [facility] [place], [place], [place]  [address] Phone: [phone]; Fax: [phone]

## 2018-08-04 ENCOUNTER — Ambulatory Visit: Payer: Medicare Other | Admitting: Cardiovascular Disease

## 2018-08-04 ENCOUNTER — Encounter: Payer: Self-pay | Admitting: Cardiovascular Disease

## 2018-08-04 VITALS — BP 112/70 | HR 57 | Ht 68.0 in | Wt 169.8 lb

## 2018-08-04 DIAGNOSIS — I10 Essential (primary) hypertension: Secondary | ICD-10-CM

## 2018-08-04 DIAGNOSIS — I2510 Atherosclerotic heart disease of native coronary artery without angina pectoris: Secondary | ICD-10-CM | POA: Diagnosis not present

## 2018-08-04 DIAGNOSIS — E7800 Pure hypercholesterolemia, unspecified: Secondary | ICD-10-CM | POA: Diagnosis not present

## 2018-08-04 NOTE — Patient Instructions (Signed)

## 2018-08-07 ENCOUNTER — Telehealth: Payer: Self-pay | Admitting: Primary Care

## 2018-08-07 NOTE — Telephone Encounter (Signed)
She didn't mention any of this during her visit last week.  Please notify patient that in regards to the diarrhea we can reduce her Metformin dose to 500 mg once daily. Metformin is a good medication so if the diarrhea isn't too bad then I do recommend we continue.   Has she ever tried omeprazole (Prilosec)? We can switch to this.   Let me know.

## 2018-08-07 NOTE — Telephone Encounter (Signed)
Patient called for lab results- she had other questions:   Patient has noticed since she started metformin 750 mg she has had increased diarrhea and she gets caught out sometimes. She wants to know if there is something that can help that?  Patient also states the pantoprazole does not work well to control her burping and she would like to know if there is something else.  ( Prevacid worked Engineer, manufacturing- but not covered by Insurance underwriter)   Patient states message can be left on voice mail- she has to take her sister to the cancer center today

## 2018-08-08 NOTE — Telephone Encounter (Signed)
Noted  

## 2018-08-08 NOTE — Telephone Encounter (Signed)
Spoken and notified patient of [name] [name] comments earlier this afternoon. Patient stated that she will continue as for right now. She will let us know if she feels the need to change.  Patient stated that she have not tried omeprazole but wants to hold off on trying something else right now.

## 2018-08-15 ENCOUNTER — Ambulatory Visit (INDEPENDENT_AMBULATORY_CARE_PROVIDER_SITE_OTHER): Payer: Medicare Other | Admitting: Podiatry

## 2018-08-15 ENCOUNTER — Encounter: Payer: Self-pay | Admitting: Podiatry

## 2018-08-15 ENCOUNTER — Ambulatory Visit (INDEPENDENT_AMBULATORY_CARE_PROVIDER_SITE_OTHER): Payer: Medicare Other

## 2018-08-15 ENCOUNTER — Other Ambulatory Visit: Payer: Self-pay | Admitting: Podiatry

## 2018-08-15 DIAGNOSIS — S93601A Unspecified sprain of right foot, initial encounter: Secondary | ICD-10-CM | POA: Diagnosis not present

## 2018-08-15 DIAGNOSIS — M722 Plantar fascial fibromatosis: Secondary | ICD-10-CM | POA: Diagnosis not present

## 2018-08-15 DIAGNOSIS — M79671 Pain in right foot: Secondary | ICD-10-CM

## 2018-08-16 NOTE — Progress Notes (Signed)
   Subjective: 84 year old female presenting today for follow up evaluation of plantar fasciitis of the left foot. She states the pain has worsened. She also notes pain to the dorsal right foot secondary to an injury 3-4 weeks ago. She states she fell in her garage when the pain began. She denies modifying factors and has been taking OTC Tylenol for pain. She states the pain in the right foot is not as bad as the left. Patient is here for further evaluation and treatment.   Past Medical History:  Diagnosis Date  . Allergy    Cipro, Plavix, Statins  . CAD (coronary artery disease)    a. s/p tandem Promus DES to LAD in 2009 by Dr. Olevia Perches;  b.  LHC (03/22/14):  prox LAD 30%, mid LAD 40%, LAD stent ok with dist 20% ISR, apical LAD occluded with L-L collats filling apical vessel (too small for PCI), mid RCA 20%, EF 70%.  Med Rx  . Diabetes mellitus    Diagnosed 2012  . Diverticulosis of colon (without mention of hemorrhage)   . GERD (gastroesophageal reflux disease)   . Hx of cardiovascular stress test    a. Nuclear (08/2013):  No ischemia, EF 80%, Normal  . Hx of echocardiogram    a. Echo (07/2013):  Mild LVH, vigorous LVF, EF 65-70%, Gr 1 DD, mild MR, mild to mod LAE  . Hyperlipidemia    intol of statins  . Hypertension   . NHL (non-Hodgkin's lymphoma) ([place]) dx'd 2003   chemo/xrt comp 2003  . Personal history of colonic polyps   . Proctitis   . Urticaria      Objective: Physical Exam General: The patient is alert and oriented x3 in no acute distress.  Dermatology: Skin is warm, dry and supple bilateral lower extremities. Negative for open lesions or macerations bilateral.   Vascular: Dorsalis Pedis and Posterior Tibial pulses palpable bilateral.  Capillary fill time is immediate to all digits.  Neurological: Epicritic and protective threshold intact bilateral.   Musculoskeletal: Tenderness to palpation to the plantar aspect of the left heel along the plantar fascia as well as to  the dorsum of the right foot. All other joints range of motion within normal limits bilateral. Strength 5/5 in all groups bilateral.   Radiographic exam:   Normal osseous mineralization. Joint spaces preserved. No fracture/dislocation/boney destruction. No other soft tissue abnormalities or radiopaque foreign bodies.   Assessment: 1. Plantar fasciitis left foot 2. Right foot sprain secondary to mechanical fall   Plan of Care:  1. Patient evaluated. Xrays reviewed.   2. Injection of 0.5cc Celestone soluspan injected into the left plantar fascia.  3. Continue taking OTC Tylenol.  4. Continue using plantar fascial brace.  5. Recommended good shoe gear.  6. Continue conservative treatment for right foot including rest and ice.  7. Return to clinic in 4 weeks.    Edrick Kins, DPM [facility]  Dr. Edrick Kins, DPM    [address], [place] [address]                Office [phone]  Fax [phone]

## 2018-08-21 ENCOUNTER — Other Ambulatory Visit: Payer: Self-pay | Admitting: Cardiovascular Disease

## 2018-08-21 ENCOUNTER — Other Ambulatory Visit: Payer: Self-pay | Admitting: Internal Medicine

## 2018-08-21 DIAGNOSIS — E0821 Diabetes mellitus due to underlying condition with diabetic nephropathy: Secondary | ICD-10-CM

## 2018-08-21 MED ORDER — ISOSORBIDE MONONITRATE ER 60 MG PO TB24: 60.0000 mg | ORAL_TABLET | Freq: Every day | ORAL | 3 refills | Status: DC

## 2018-08-21 NOTE — Telephone Encounter (Signed)
Pt's medication was sent to pt's pharmacy as requested. Confirmation received.  °

## 2018-09-08 ENCOUNTER — Encounter: Payer: Self-pay | Admitting: Podiatry

## 2018-09-08 ENCOUNTER — Ambulatory Visit: Payer: Medicare Other | Admitting: Podiatry

## 2018-09-08 DIAGNOSIS — M722 Plantar fascial fibromatosis: Secondary | ICD-10-CM

## 2018-09-08 DIAGNOSIS — S93601D Unspecified sprain of right foot, subsequent encounter: Secondary | ICD-10-CM

## 2018-09-12 ENCOUNTER — Ambulatory Visit: Payer: Medicare Other | Admitting: Podiatry

## 2018-09-12 NOTE — Progress Notes (Signed)
   Subjective: 84 year old female presenting today for follow up evaluation of plantar fasciitis of the left foot and a right foot sprain. She states her pain has improved significantly since her last visit. She reports some continued mild aching. She has been wearing the plantar fascial brace for treatment. She denies modifying factors. Patient is here for further evaluation and treatment.   Past Medical History:  Diagnosis Date  . Allergy    Cipro, Plavix, Statins  . CAD (coronary artery disease)    a. s/p tandem Promus DES to LAD in 2009 by Dr. Olevia Perches;  b.  LHC (03/22/14):  prox LAD 30%, mid LAD 40%, LAD stent ok with dist 20% ISR, apical LAD occluded with L-L collats filling apical vessel (too small for PCI), mid RCA 20%, EF 70%.  Med Rx  . Diabetes mellitus    Diagnosed 2012  . Diverticulosis of colon (without mention of hemorrhage)   . GERD (gastroesophageal reflux disease)   . Hx of cardiovascular stress test    a. Nuclear (08/2013):  No ischemia, EF 80%, Normal  . Hx of echocardiogram    a. Echo (07/2013):  Mild LVH, vigorous LVF, EF 65-70%, Gr 1 DD, mild MR, mild to mod LAE  . Hyperlipidemia    intol of statins  . Hypertension   . NHL (non-Hodgkin's lymphoma) ([place]) dx'd 2003   chemo/xrt comp 2003  . Personal history of colonic polyps   . Proctitis   . Urticaria      Objective: Physical Exam General: The patient is alert and oriented x3 in no acute distress.  Dermatology: Skin is warm, dry and supple bilateral lower extremities. Negative for open lesions or macerations bilateral.   Vascular: Dorsalis Pedis and Posterior Tibial pulses palpable bilateral.  Capillary fill time is immediate to all digits.  Neurological: Epicritic and protective threshold intact bilateral.   Musculoskeletal: Tenderness to palpation to the plantar aspect of the left heel along the plantar fascia as well as to the dorsum of the right foot. All other joints range of motion within normal limits  bilateral. Strength 5/5 in all groups bilateral.   Assessment: 1. Plantar fasciitis left foot - resolved  2. Right foot sprain secondary to mechanical fall   Plan of Care:  1. Patient evaluated.  2. Continue wearing good shoe gear.  3. Continue taking OTC Tylenol as needed.  4. Return to clinic as needed.     Edrick Kins, DPM [facility]  Dr. Edrick Kins, DPM    [address], [place] [address]                Office [phone]  Fax [phone]

## 2018-09-15 ENCOUNTER — Other Ambulatory Visit: Payer: Self-pay | Admitting: Primary Care

## 2018-09-28 ENCOUNTER — Encounter

## 2018-10-03 ENCOUNTER — Encounter

## 2018-10-05 ENCOUNTER — Encounter: Payer: Self-pay | Admitting: Primary Care

## 2018-10-05 ENCOUNTER — Ambulatory Visit: Payer: Medicare Other | Admitting: Primary Care

## 2018-10-05 VITALS — BP 120/68 | HR 59 | Temp 98.0°F | Ht 68.0 in | Wt 168.8 lb

## 2018-10-05 DIAGNOSIS — M542 Cervicalgia: Secondary | ICD-10-CM | POA: Diagnosis not present

## 2018-10-05 MED ORDER — CYCLOBENZAPRINE HCL 5 MG PO TABS: 5.0000 mg | ORAL_TABLET | Freq: Three times a day (TID) | ORAL | 0 refills | Status: DC | PRN

## 2018-10-05 NOTE — Patient Instructions (Signed)
You may take the cyclobenzaprine tablets every 8 hours as needed for muscle tightness/spasm. Caution as this may cause drowsiness.  You may take Ibuprofen and Tylenol as needed.  Apply a heating pad to the site of your neck pain. Try to stretch the neck daily to improve mobility.  Please update me in 1 week if no improvement.  It was a pleasure to see you today!

## 2018-10-05 NOTE — Progress Notes (Signed)
Subjective:    Patient ID: [name] [name], female    DOB: [birth year], 84 y.o.   MRN: [id number]  HPI  [name] [name] is an 84 year old female with a history of arthralgias who presents today with a chief complaint of neck and shoulder pain.   Her pain is located to the lower cervical spine with radiation down to her right trapezius muscle and posterior shoulder. She describes her pain as a tightness/pulling. She fell 2-3 months ago, accidentally missed the bottom step in her garage. She fell forward landing onto her right side. She did not hit her head or neck. Her pain began 2 weeks ago.  She's been alternating Tylenol and Ibuprofen without much improvement. She has a nearly empty bottle of cyclobenzaprine for which she's not taken, this was an older prescription. She denies radiation of pain or numbness/tingling down her right upper extremity.    She continues to remain under a lot of stress with her chronically ill husband, sister, and now brother.   Review of Systems  Musculoskeletal: Positive for myalgias and neck pain.  Skin: Negative for color change.  Neurological: Negative for numbness.       Past Medical History:  Diagnosis Date  . Allergy    Cipro, Plavix, Statins  . CAD (coronary artery disease)    a. s/p tandem Promus DES to LAD in 2009 by Dr. Olevia Perches;  b.  LHC (03/22/14):  prox LAD 30%, mid LAD 40%, LAD stent ok with dist 20% ISR, apical LAD occluded with L-L collats filling apical vessel (too small for PCI), mid RCA 20%, EF 70%.  Med Rx  . Diabetes mellitus    Diagnosed 2012  . Diverticulosis of colon (without mention of hemorrhage)   . GERD (gastroesophageal reflux disease)   . Hx of cardiovascular stress test    a. Nuclear (08/2013):  No ischemia, EF 80%, Normal  . Hx of echocardiogram    a. Echo (07/2013):  Mild LVH, vigorous LVF, EF 65-70%, Gr 1 DD, mild MR, mild to mod LAE  . Hyperlipidemia    intol of statins  . Hypertension   . NHL (non-Hodgkin's lymphoma) ([place])  dx'd 2003   chemo/xrt comp 2003  . Personal history of colonic polyps   . Proctitis   . Urticaria      Social History   Socioeconomic History  . Marital status: Married    Spouse name: Not on file  . Number of children: Not on file  . Years of education: Not on file  . Highest education level: Not on file  Occupational History  . Occupation: Retired    Fish farm manager: RETIRED  Social Needs  . Financial resource strain: Not on file  . Food insecurity:    Worry: Not on file    Inability: Not on file  . Transportation needs:    Medical: Not on file    Non-medical: Not on file  Tobacco Use  . Smoking status: Never Smoker  . Smokeless tobacco: Never Used  Substance and Sexual Activity  . Alcohol use: No  . Drug use: No  . Sexual activity: Never  Lifestyle  . Physical activity:    Days per week: Not on file    Minutes per session: Not on file  . Stress: Not on file  Relationships  . Social connections:    Talks on phone: Not on file    Gets together: Not on file    Attends religious service: Not on file  Active member of club or organization: Not on file    Attends meetings of clubs or organizations: Not on file    Relationship status: Not on file  . Intimate partner violence:    Fear of current or ex partner: Not on file    Emotionally abused: Not on file    Physically abused: Not on file    Forced sexual activity: Not on file  Other Topics Concern  . Not on file  Social History Narrative  . Not on file    Past Surgical History:  Procedure Laterality Date  . cardiac cath-neg    . CORONARY ANGIOPLASTY WITH STENT PLACEMENT     x 2  . dexa-neg    . KNEE SURGERY  10/2003   left  . laser surgery for cataracts-left    . LEFT HEART CATHETERIZATION WITH CORONARY ANGIOGRAM N/A 03/22/2014   Procedure: LEFT HEART CATHETERIZATION WITH CORONARY ANGIOGRAM;  Surgeon: Burnell Blanks, MD;  Location: [facility] CATH LAB;  Service: Cardiovascular;  Laterality: N/A;  . stress  cardiolite    . VAGINAL HYSTERECTOMY     partial, fibroids, one ovary left    Family History  Problem Relation Age of Onset  . Cancer Mother        jaw  . Hypertension Father   . Heart attack Father   . Heart attack Sister   . Colon cancer Neg Hx     Allergies  Allergen Reactions  . Other Rash and Anaphylaxis  . Shellfish Allergy Anaphylaxis and Rash  . Cephalexin     REACTION: trash and swelling REACTION: trash and swelling  . Ciprofloxacin     REACTION: u/k  . Clopidogrel Bisulfate     REACTION: swelling, rash  . Codeine Phosphate     REACTION: unspecified  . Ezetimibe-Simvastatin     REACTION: myalgias REACTION: myalgias  . Hydrocod Polst-Cpm Polst Er     REACTION: u/k  . Lidocaine Hives    ONLY TO ADHESIVE LIDOCAINE PATCHES. NO PROBLEM WITH INJECTABLE LIDOCAINE.  Marland Kitchen Nitrofurantoin     REACTION: [place] REACTION: [place]  . Pregabalin     REACTION: felt bad REACTION: felt bad  . Propoxyphene N-Acetaminophen     REACTION: [place]  . Rofecoxib     unk unk   . Sulfamethoxazole     REACTION: unspecified  . Sulfonamide Derivatives     REACTION: unspecified  . Celecoxib Rash    REACTION: [place] REACTION: [place]  . Codeine Rash    REACTION: [place] REACTION: [place]  . Hydrocod Polst-Cpm Polst Er Rash  . Propoxyphene Rash  . Sulfa Antibiotics Rash    REACTION: unspecified REACTION: unspecified    Current Outpatient Medications on File Prior to Visit  Medication Sig Dispense Refill  . aspirin (ASPIRIN LOW DOSE) 81 MG tablet Take 81 mg by mouth daily.      . blood glucose meter kit and supplies Dispense based on patient and insurance preference. Use up to times times daily as directed. (FOR ICD-10 E10.9, E11.9). 1 each 0  . brimonidine-timolol (COMBIGAN) 0.2-0.5 % ophthalmic solution Place 1 drop into both eyes every 12 (twelve) hours.    . Cholecalciferol (VITAMIN D-3) 1000 UNITS CAPS Take 1 capsule by mouth daily.    Marland Kitchen ezetimibe (ZETIA) 10 MG tablet TAKE ONE TABLET BY MOUTH EVERY DAY  90 tablet 2  . fexofenadine (ALLEGRA) 180 MG tablet Take 180 mg by mouth as needed for allergies or rhinitis.    . fluticasone (FLONASE) 50  MCG/ACT nasal spray Place 2 sprays into both nostrils as needed for allergies or rhinitis.    Marland Kitchen glipiZIDE (GLUCOTROL XL) 10 MG 24 hr tablet TAKE ONE TABLET BY MOUTH DAILY WITH BREAKFAST 90 tablet 0  . glucose blood (ONE TOUCH ULTRA TEST) test strip USE UP TO 4 TIMES DAILY TO CHECK BLOOD SUGAR AS DIRECTED 100 each 2  . isosorbide mononitrate (IMDUR) 60 MG 24 hr tablet Take 1 tablet (60 mg total) by mouth daily. 90 tablet 3  . losartan (COZAAR) 50 MG tablet TAKE ONE TABLET BY MOUTH EVERY DAY 90 tablet 2  . metFORMIN (GLUCOPHAGE-XR) 750 MG 24 hr tablet Take 1 tablet by mouth with breakfast for diabetes. 90 tablet 1  . metoprolol succinate (TOPROL-XL) 100 MG 24 hr tablet TAKE ONE TABLET BY MOUTH EVERY MORNING WITH OR IMMEDIATELY FOLLOWING MEAL 90 tablet 1  . nitroGLYCERIN (NITROSTAT) 0.4 MG SL tablet Place 1 tablet (0.4 mg total) under the tongue every 5 (five) minutes as needed for chest pain. 26 tablet 0  . OVER THE COUNTER MEDICATION Take 1 tablet by mouth daily. Med name: VISION FORMULA    . pantoprazole (PROTONIX) 40 MG tablet Take 1 tablet (40 mg total) by mouth daily. 30 tablet 3  . timolol (TIMOPTIC) 0.5 % ophthalmic solution     . trimethoprim (TRIMPEX) 100 MG tablet Take 100 mg by mouth 2 (two) times daily as needed (UTI).     . vitamin B-12 (CYANOCOBALAMIN) 1000 MCG tablet Take 1,000 mcg by mouth daily.    . [DISCONTINUED] loratadine (CLARITIN) 10 MG tablet Take 10 mg by mouth as needed.      No current facility-administered medications on file prior to visit.     BP 120/68   Pulse (!) 59   Temp 98 F (36.7 C) (Oral)   Ht 5' 8"  (1.727 m)   Wt 168 lb 12 oz (76.5 kg)   SpO2 97%   BMI 25.66 kg/m    Objective:   Physical Exam  Constitutional: She appears well-nourished.  Neck: Neck supple. Muscular tenderness present. No spinous process  tenderness present. Decreased range of motion present.    Decrease in ROM with tightness with right lateral rotation, neck extension, neck flexion.  Cardiovascular: Normal rate and regular rhythm.  Respiratory: Effort normal and breath sounds normal.  Musculoskeletal:       Right shoulder: She exhibits normal range of motion, no tenderness, no pain and normal strength.       Cervical back: She exhibits decreased range of motion and pain. She exhibits no tenderness and no swelling.       Back:  Muscle tightness noted to right upper trapezius muscle.   Skin: Skin is warm and dry.           Assessment & Plan:  Acute Neck Pain:  Present for the last 2 weeks. No recent trauma.  Exam today consistent for MSK involvement.  No radiculopathy, cervical spinal tenderness. Good strength to bilateral upper extremities.  Refill provided for cyclobenzaprine, drowsiness precautions provided.  Discussed Ibuprofen, Tylenol, heat/ice.  She will update.   Pleas Koch, NP

## 2018-10-06 ENCOUNTER — Other Ambulatory Visit: Payer: Self-pay | Admitting: Primary Care

## 2018-10-06 NOTE — Telephone Encounter (Signed)
Refilled pantoprazole 40 mg # 30 x 3; TOC 01/12/18 [place].

## 2018-10-17 ENCOUNTER — Encounter: Payer: Self-pay | Admitting: Primary Care

## 2018-10-17 ENCOUNTER — Ambulatory Visit: Payer: Medicare Other | Admitting: Primary Care

## 2018-10-17 VITALS — BP 126/70 | HR 63 | Temp 97.9°F | Ht 68.0 in | Wt 169.5 lb

## 2018-10-17 DIAGNOSIS — M542 Cervicalgia: Secondary | ICD-10-CM

## 2018-10-17 DIAGNOSIS — Z6379 Other stressful life events affecting family and household: Secondary | ICD-10-CM | POA: Diagnosis not present

## 2018-10-17 MED ORDER — NAPROXEN 500 MG PO TABS: ORAL_TABLET | ORAL | 0 refills | Status: DC

## 2018-10-17 MED ORDER — SERTRALINE HCL 25 MG PO TABS: 25.0000 mg | ORAL_TABLET | Freq: Every day | ORAL | 1 refills | Status: DC

## 2018-10-17 NOTE — Progress Notes (Signed)
Subjective:    Patient ID: [name] [name], female    DOB: Dec 02, 1931, 84 y.o.   MRN: [id number]  HPI  [name] [name] is an 84 year old female who presents today with a chief complaint of neck pain. She's also requesting treatment for anxiety.  She was last evaluated on 10/05/18 with complaints of right lower neck pain with radiation down her right trapezius and posterior shoulder. She endorsed falling 2-3 months prior, did not hit her head or neck. She was treated with muscle relaxer, Ibuprofen/Tylenol, ice/heat.   Since her last visit she's noticed some improvement in ROM and pain but continues to notice symptoms. Her pain is worse with extension and right rotation. The cyclobenzaprine has helped her to sleep.   She denies numbness/tingling down her extremities. She is taking Ibuprofen/Tylenol and applying lidocaine patches with some improvement in her symptoms.   She's under a lot of stress as she's taking care of her husband, sister, and brother who are all chronically ill. She endorses daily worry, is easily irritable, feels anxious. She has mentioned this on prior visits and thinks that this could be contributing to her neck symptoms. She would like to pursue treatment for stress/anxiety.    Review of Systems  Musculoskeletal: Positive for neck pain.  Skin: Negative for color change.  Neurological: Negative for weakness and numbness.  Psychiatric/Behavioral: The patient is nervous/anxious.        Past Medical History:  Diagnosis Date  . Allergy    Cipro, Plavix, Statins  . CAD (coronary artery disease)    a. s/p tandem Promus DES to LAD in 2009 by Dr. Olevia Perches;  b.  LHC (03/22/14):  prox LAD 30%, mid LAD 40%, LAD stent ok with dist 20% ISR, apical LAD occluded with L-L collats filling apical vessel (too small for PCI), mid RCA 20%, EF 70%.  Med Rx  . Diabetes mellitus    Diagnosed 2012  . Diverticulosis of colon (without mention of hemorrhage)   . GERD (gastroesophageal reflux disease)    . Hx of cardiovascular stress test    a. Nuclear (08/2013):  No ischemia, EF 80%, Normal  . Hx of echocardiogram    a. Echo (07/2013):  Mild LVH, vigorous LVF, EF 65-70%, Gr 1 DD, mild MR, mild to mod LAE  . Hyperlipidemia    intol of statins  . Hypertension   . NHL (non-Hodgkin's lymphoma) ([place]) dx'd 2003   chemo/xrt comp 2003  . Personal history of colonic polyps   . Proctitis   . Urticaria      Social History   Socioeconomic History  . Marital status: Married    Spouse name: Not on file  . Number of children: Not on file  . Years of education: Not on file  . Highest education level: Not on file  Occupational History  . Occupation: Retired    Fish farm manager: RETIRED  Social Needs  . Financial resource strain: Not on file  . Food insecurity:    Worry: Not on file    Inability: Not on file  . Transportation needs:    Medical: Not on file    Non-medical: Not on file  Tobacco Use  . Smoking status: Never Smoker  . Smokeless tobacco: Never Used  Substance and Sexual Activity  . Alcohol use: No  . Drug use: No  . Sexual activity: Never  Lifestyle  . Physical activity:    Days per week: Not on file    Minutes per session: Not  on file  . Stress: Not on file  Relationships  . Social connections:    Talks on phone: Not on file    Gets together: Not on file    Attends religious service: Not on file    Active member of club or organization: Not on file    Attends meetings of clubs or organizations: Not on file    Relationship status: Not on file  . Intimate partner violence:    Fear of current or ex partner: Not on file    Emotionally abused: Not on file    Physically abused: Not on file    Forced sexual activity: Not on file  Other Topics Concern  . Not on file  Social History Narrative  . Not on file    Past Surgical History:  Procedure Laterality Date  . cardiac cath-neg    . CORONARY ANGIOPLASTY WITH STENT PLACEMENT     x 2  . dexa-neg    . KNEE SURGERY   10/2003   left  . laser surgery for cataracts-left    . LEFT HEART CATHETERIZATION WITH CORONARY ANGIOGRAM N/A 03/22/2014   Procedure: LEFT HEART CATHETERIZATION WITH CORONARY ANGIOGRAM;  Surgeon: Burnell Blanks, MD;  Location: [facility] CATH LAB;  Service: Cardiovascular;  Laterality: N/A;  . stress cardiolite    . VAGINAL HYSTERECTOMY     partial, fibroids, one ovary left    Family History  Problem Relation Age of Onset  . Cancer Mother        jaw  . Hypertension Father   . Heart attack Father   . Heart attack Sister   . Colon cancer Neg Hx     Allergies  Allergen Reactions  . Other Rash and Anaphylaxis  . Shellfish Allergy Anaphylaxis and Rash  . Cephalexin     REACTION: trash and swelling REACTION: trash and swelling  . Ciprofloxacin     REACTION: u/k  . Clopidogrel Bisulfate     REACTION: swelling, rash  . Codeine Phosphate     REACTION: unspecified  . Ezetimibe-Simvastatin     REACTION: myalgias REACTION: myalgias  . Hydrocod Polst-Cpm Polst Er     REACTION: u/k  . Lidocaine Hives    ONLY TO ADHESIVE LIDOCAINE PATCHES. NO PROBLEM WITH INJECTABLE LIDOCAINE.  Marland Kitchen Nitrofurantoin     REACTION: [place] REACTION: [place]  . Pregabalin     REACTION: felt bad REACTION: felt bad  . Propoxyphene N-Acetaminophen     REACTION: [place]  . Rofecoxib     unk unk   . Sulfamethoxazole     REACTION: unspecified  . Sulfonamide Derivatives     REACTION: unspecified  . Celecoxib Rash    REACTION: [place] REACTION: [place]  . Codeine Rash    REACTION: [place] REACTION: [place]  . Hydrocod Polst-Cpm Polst Er Rash  . Propoxyphene Rash  . Sulfa Antibiotics Rash    REACTION: unspecified REACTION: unspecified    Current Outpatient Medications on File Prior to Visit  Medication Sig Dispense Refill  . aspirin (ASPIRIN LOW DOSE) 81 MG tablet Take 81 mg by mouth daily.      . blood glucose meter kit and supplies Dispense based on patient and insurance preference. Use up to times times daily as directed.  (FOR ICD-10 E10.9, E11.9). 1 each 0  . brimonidine-timolol (COMBIGAN) 0.2-0.5 % ophthalmic solution Place 1 drop into both eyes every 12 (twelve) hours.    . Cholecalciferol (VITAMIN D-3) 1000 UNITS CAPS Take 1 capsule by mouth daily.    Marland Kitchen  cyclobenzaprine (FLEXERIL) 5 MG tablet Take 1 tablet (5 mg total) by mouth 3 (three) times daily as needed for muscle spasms. 30 tablet 0  . ezetimibe (ZETIA) 10 MG tablet TAKE ONE TABLET BY MOUTH EVERY DAY 90 tablet 2  . fexofenadine (ALLEGRA) 180 MG tablet Take 180 mg by mouth as needed for allergies or rhinitis.    . fluticasone (FLONASE) 50 MCG/ACT nasal spray Place 2 sprays into both nostrils as needed for allergies or rhinitis.    Marland Kitchen glipiZIDE (GLUCOTROL XL) 10 MG 24 hr tablet TAKE ONE TABLET BY MOUTH DAILY WITH BREAKFAST 90 tablet 0  . glucose blood (ONE TOUCH ULTRA TEST) test strip USE UP TO 4 TIMES DAILY TO CHECK BLOOD SUGAR AS DIRECTED 100 each 2  . isosorbide mononitrate (IMDUR) 60 MG 24 hr tablet Take 1 tablet (60 mg total) by mouth daily. 90 tablet 3  . losartan (COZAAR) 50 MG tablet TAKE ONE TABLET BY MOUTH EVERY DAY 90 tablet 2  . metFORMIN (GLUCOPHAGE-XR) 750 MG 24 hr tablet Take 1 tablet by mouth with breakfast for diabetes. 90 tablet 1  . metoprolol succinate (TOPROL-XL) 100 MG 24 hr tablet TAKE ONE TABLET BY MOUTH EVERY MORNING WITH OR IMMEDIATELY FOLLOWING MEAL 90 tablet 1  . nitroGLYCERIN (NITROSTAT) 0.4 MG SL tablet Place 1 tablet (0.4 mg total) under the tongue every 5 (five) minutes as needed for chest pain. 26 tablet 0  . OVER THE COUNTER MEDICATION Take 1 tablet by mouth daily. Med name: VISION FORMULA    . pantoprazole (PROTONIX) 40 MG tablet TAKE ONE TABLET BY MOUTH DAILY 30 tablet 3  . timolol (TIMOPTIC) 0.5 % ophthalmic solution     . trimethoprim (TRIMPEX) 100 MG tablet Take 100 mg by mouth 2 (two) times daily as needed (UTI).     . vitamin B-12 (CYANOCOBALAMIN) 1000 MCG tablet Take 1,000 mcg by mouth daily.    . [DISCONTINUED]  loratadine (CLARITIN) 10 MG tablet Take 10 mg by mouth as needed.      No current facility-administered medications on file prior to visit.     BP 126/70   Pulse 63   Temp 97.9 F (36.6 C) (Oral)   Ht 5' 8"  (1.727 m)   Wt 169 lb 8 oz (76.9 kg)   SpO2 97%   BMI 25.77 kg/m    Objective:   Physical Exam  Constitutional: She appears well-nourished.  Neck: Neck supple. Muscular tenderness present. No spinous process tenderness present. Decreased range of motion present.    Decrease in ROM with extension and right rotation. Tenderness to lower cervical spine and right lateral neck.   Cardiovascular: Normal rate and regular rhythm.  Respiratory: Effort normal and breath sounds normal.  Musculoskeletal:  5/5 strength to bilateral upper extremities.   Skin: Skin is warm and dry.  Psychiatric: She has a normal mood and affect.           Assessment & Plan:

## 2018-10-17 NOTE — Assessment & Plan Note (Signed)
Slight improvement but overall continues to be bothersome. Do agree that stress is playing a role. Check plain films given trauma 2-3 months ago.  Prescription for naproxen 500 mg twice daily sent to pharmacy.  Discussed to avoid other NSAIDs.  Okay to use Tylenol and Flexeril as needed.  Discussed application of heat.  Also discussed potential for physical therapy evaluation, she kindly declines at this time.  She would like to treat her stress/anxiety to see if this will help with her symptoms.

## 2018-10-17 NOTE — Patient Instructions (Signed)
You can try taking naproxen 500 mg tablets as needed for pain. Take twice daily with food.   Do not take Ibuprofen, Advil, Motrin with the naproxen.  You may take Tylenol is needed. Continue applying heat as needed.  Complete xray(s) prior to leaving today. I will notify you of your results once received.  Start sertraline (Zoloft) 25 mg tablets for anxiety. Take 1 tablet by mouth once daily.  Schedule a follow up visit in 6 weeks for re-evaluation of anxiety.  It was a pleasure to see you today!

## 2018-10-17 NOTE — Assessment & Plan Note (Signed)
Uncontrolled, persists.  We have discussed this during several office visits and she is ready for treatment.  Discussed different options for treatment and she opts for medication.  Prescription for sertraline 25 mg sent to pharmacy. We discussed possible side effects of headache, GI upset, drowsiness, and SI/HI. If thoughts of SI/HI develop, we discussed to present to the emergency immediately. Patient verbalized understanding.   Follow up in 6 weeks for re-evaluation.

## 2018-10-18 ENCOUNTER — Ambulatory Visit (INDEPENDENT_AMBULATORY_CARE_PROVIDER_SITE_OTHER)
Admission: RE | Admit: 2018-10-18 | Discharge: 2018-10-18 | Disposition: A | Discharge status: Home / Self Care | Payer: Medicare Other | Source: Ambulatory Visit | Attending: Primary Care | Admitting: Primary Care

## 2018-10-18 DIAGNOSIS — M542 Cervicalgia: Secondary | ICD-10-CM | POA: Diagnosis not present

## 2018-10-23 ENCOUNTER — Ambulatory Visit: Payer: Medicare Other | Admitting: Primary Care

## 2018-10-23 ENCOUNTER — Ambulatory Visit (INDEPENDENT_AMBULATORY_CARE_PROVIDER_SITE_OTHER)
Admission: RE | Admit: 2018-10-23 | Discharge: 2018-10-23 | Disposition: A | Discharge status: Home / Self Care | Payer: Medicare Other | Source: Ambulatory Visit | Attending: Primary Care | Admitting: Primary Care

## 2018-10-23 VITALS — BP 120/68 | HR 76 | Temp 98.7°F | Ht 68.0 in | Wt 169.5 lb

## 2018-10-23 DIAGNOSIS — J069 Acute upper respiratory infection, unspecified: Secondary | ICD-10-CM | POA: Diagnosis not present

## 2018-10-23 MED ORDER — BENZONATATE 200 MG PO CAPS: 200.0000 mg | ORAL_CAPSULE | Freq: Three times a day (TID) | ORAL | 0 refills | Status: DC | PRN

## 2018-10-23 NOTE — Patient Instructions (Signed)
You may take Benzonatate capsules for cough. Take 1 capsule by mouth three times daily as needed for cough.  Complete xray(s) prior to leaving today. I will notify you of your results once received.  Make sure to drink plenty of water to help thin your mucous.  Continue the Flonase if needed for sinus pressure/congestion.  Please notify me if you develop persistent fevers of 101, notice increased fatigue or weakness, and/or feel worse after 1 week of onset of symptoms.   It was a pleasure to see you today!

## 2018-10-23 NOTE — Progress Notes (Signed)
Subjective:    Patient ID: [name] [name], female    DOB: [birth year], 84 y.o.   MRN: [id number]  HPI  [name] [name] is an 84 year old female with a history of hypertension, mitral regurgitation, GERD who presents today with a chief complaint of cough.  She also reports chest congestion, fatigue, voice hoarseness, shortness of breath. Her symptoms began four days ago with a tickle to her throat, has since progressed to chest congestion with cough. Her cough is most non productive but has seen a little yellow/green sputum. She denies fevers. She's been taking Tylenol, Flonase, and Delsym with some improvement.   Review of Systems  Constitutional: Positive for fatigue. Negative for chills and fever.  HENT: Positive for congestion. Negative for sinus pressure and sore throat.   Respiratory: Positive for cough and shortness of breath. Negative for wheezing.        Past Medical History:  Diagnosis Date  . Allergy    Cipro, Plavix, Statins  . CAD (coronary artery disease)    a. s/p tandem Promus DES to LAD in 2009 by Dr. Olevia Perches;  b.  LHC (03/22/14):  prox LAD 30%, mid LAD 40%, LAD stent ok with dist 20% ISR, apical LAD occluded with L-L collats filling apical vessel (too small for PCI), mid RCA 20%, EF 70%.  Med Rx  . Diabetes mellitus    Diagnosed 2012  . Diverticulosis of colon (without mention of hemorrhage)   . GERD (gastroesophageal reflux disease)   . Hx of cardiovascular stress test    a. Nuclear (08/2013):  No ischemia, EF 80%, Normal  . Hx of echocardiogram    a. Echo (07/2013):  Mild LVH, vigorous LVF, EF 65-70%, Gr 1 DD, mild MR, mild to mod LAE  . Hyperlipidemia    intol of statins  . Hypertension   . NHL (non-Hodgkin's lymphoma) ([place]) dx'd 2003   chemo/xrt comp 2003  . Personal history of colonic polyps   . Proctitis   . Urticaria      Social History   Socioeconomic History  . Marital status: Married    Spouse name: Not on file  . Number of children: Not on file  .  Years of education: Not on file  . Highest education level: Not on file  Occupational History  . Occupation: Retired    Fish farm manager: RETIRED  Social Needs  . Financial resource strain: Not on file  . Food insecurity:    Worry: Not on file    Inability: Not on file  . Transportation needs:    Medical: Not on file    Non-medical: Not on file  Tobacco Use  . Smoking status: Never Smoker  . Smokeless tobacco: Never Used  Substance and Sexual Activity  . Alcohol use: No  . Drug use: No  . Sexual activity: Never  Lifestyle  . Physical activity:    Days per week: Not on file    Minutes per session: Not on file  . Stress: Not on file  Relationships  . Social connections:    Talks on phone: Not on file    Gets together: Not on file    Attends religious service: Not on file    Active member of club or organization: Not on file    Attends meetings of clubs or organizations: Not on file    Relationship status: Not on file  . Intimate partner violence:    Fear of current or ex partner: Not on file  Emotionally abused: Not on file    Physically abused: Not on file    Forced sexual activity: Not on file  Other Topics Concern  . Not on file  Social History Narrative  . Not on file    Past Surgical History:  Procedure Laterality Date  . cardiac cath-neg    . CORONARY ANGIOPLASTY WITH STENT PLACEMENT     x 2  . dexa-neg    . KNEE SURGERY  10/2003   left  . laser surgery for cataracts-left    . LEFT HEART CATHETERIZATION WITH CORONARY ANGIOGRAM N/A 03/22/2014   Procedure: LEFT HEART CATHETERIZATION WITH CORONARY ANGIOGRAM;  Surgeon: Burnell Blanks, MD;  Location: [facility] CATH LAB;  Service: Cardiovascular;  Laterality: N/A;  . stress cardiolite    . VAGINAL HYSTERECTOMY     partial, fibroids, one ovary left    Family History  Problem Relation Age of Onset  . Cancer Mother        jaw  . Hypertension Father   . Heart attack Father   . Heart attack Sister   . Colon  cancer Neg Hx     Allergies  Allergen Reactions  . Other Rash and Anaphylaxis  . Shellfish Allergy Anaphylaxis and Rash  . Cephalexin     REACTION: trash and swelling REACTION: trash and swelling  . Ciprofloxacin     REACTION: u/k  . Clopidogrel Bisulfate     REACTION: swelling, rash  . Codeine Phosphate     REACTION: unspecified  . Ezetimibe-Simvastatin     REACTION: myalgias REACTION: myalgias  . Hydrocod Polst-Cpm Polst Er     REACTION: u/k  . Lidocaine Hives    ONLY TO ADHESIVE LIDOCAINE PATCHES. NO PROBLEM WITH INJECTABLE LIDOCAINE.  Marland Kitchen Nitrofurantoin     REACTION: [place] REACTION: [place]  . Pregabalin     REACTION: felt bad REACTION: felt bad  . Propoxyphene N-Acetaminophen     REACTION: [place]  . Rofecoxib     unk unk   . Sulfamethoxazole     REACTION: unspecified  . Sulfonamide Derivatives     REACTION: unspecified  . Celecoxib Rash    REACTION: [place] REACTION: [place]  . Codeine Rash    REACTION: [place] REACTION: [place]  . Hydrocod Polst-Cpm Polst Er Rash  . Propoxyphene Rash  . Sulfa Antibiotics Rash    REACTION: unspecified REACTION: unspecified    Current Outpatient Medications on File Prior to Visit  Medication Sig Dispense Refill  . aspirin (ASPIRIN LOW DOSE) 81 MG tablet Take 81 mg by mouth daily.      . blood glucose meter kit and supplies Dispense based on patient and insurance preference. Use up to times times daily as directed. (FOR ICD-10 E10.9, E11.9). 1 each 0  . brimonidine-timolol (COMBIGAN) 0.2-0.5 % ophthalmic solution Place 1 drop into both eyes every 12 (twelve) hours.    . Cholecalciferol (VITAMIN D-3) 1000 UNITS CAPS Take 1 capsule by mouth daily.    . cyclobenzaprine (FLEXERIL) 5 MG tablet Take 1 tablet (5 mg total) by mouth 3 (three) times daily as needed for muscle spasms. 30 tablet 0  . ezetimibe (ZETIA) 10 MG tablet TAKE ONE TABLET BY MOUTH EVERY DAY 90 tablet 2  . fexofenadine (ALLEGRA) 180 MG tablet Take 180 mg by mouth as needed for allergies or  rhinitis.    . fluticasone (FLONASE) 50 MCG/ACT nasal spray Place 2 sprays into both nostrils as needed for allergies or rhinitis.    Marland Kitchen glipiZIDE (GLUCOTROL XL)  10 MG 24 hr tablet TAKE ONE TABLET BY MOUTH DAILY WITH BREAKFAST 90 tablet 0  . glucose blood (ONE TOUCH ULTRA TEST) test strip USE UP TO 4 TIMES DAILY TO CHECK BLOOD SUGAR AS DIRECTED 100 each 2  . isosorbide mononitrate (IMDUR) 60 MG 24 hr tablet Take 1 tablet (60 mg total) by mouth daily. 90 tablet 3  . losartan (COZAAR) 50 MG tablet TAKE ONE TABLET BY MOUTH EVERY DAY 90 tablet 2  . metFORMIN (GLUCOPHAGE-XR) 750 MG 24 hr tablet Take 1 tablet by mouth with breakfast for diabetes. 90 tablet 1  . metoprolol succinate (TOPROL-XL) 100 MG 24 hr tablet TAKE ONE TABLET BY MOUTH EVERY MORNING WITH OR IMMEDIATELY FOLLOWING MEAL 90 tablet 1  . naproxen (NAPROSYN) 500 MG tablet Take 1 tablet by mouth twice daily with food as needed for pain. 60 tablet 0  . nitroGLYCERIN (NITROSTAT) 0.4 MG SL tablet Place 1 tablet (0.4 mg total) under the tongue every 5 (five) minutes as needed for chest pain. 26 tablet 0  . OVER THE COUNTER MEDICATION Take 1 tablet by mouth daily. Med name: VISION FORMULA    . pantoprazole (PROTONIX) 40 MG tablet TAKE ONE TABLET BY MOUTH DAILY 30 tablet 3  . sertraline (ZOLOFT) 25 MG tablet Take 1 tablet (25 mg total) by mouth daily. 30 tablet 1  . timolol (TIMOPTIC) 0.5 % ophthalmic solution     . trimethoprim (TRIMPEX) 100 MG tablet Take 100 mg by mouth 2 (two) times daily as needed (UTI).     . vitamin B-12 (CYANOCOBALAMIN) 1000 MCG tablet Take 1,000 mcg by mouth daily.    . [DISCONTINUED] loratadine (CLARITIN) 10 MG tablet Take 10 mg by mouth as needed.      No current facility-administered medications on file prior to visit.     BP 120/68   Pulse 76   Temp 98.7 F (37.1 C) (Oral)   Ht 5' 8"  (1.727 m)   Wt 169 lb 8 oz (76.9 kg)   SpO2 96%   BMI 25.77 kg/m    Objective:   Physical Exam  Constitutional: She appears  well-nourished. She does not appear ill.  HENT:  Right Ear: Tympanic membrane and ear canal normal.  Left Ear: Tympanic membrane and ear canal normal.  Nose: Mucosal edema present. Right sinus exhibits no maxillary sinus tenderness and no frontal sinus tenderness. Left sinus exhibits no maxillary sinus tenderness and no frontal sinus tenderness.  Mouth/Throat: Oropharynx is clear and moist.  Hoarseness to voice  Neck: Neck supple.  Cardiovascular: Normal rate and regular rhythm.  Respiratory: Effort normal and breath sounds normal. She has no wheezes.  Dry, deep cough noticed twice during exam  Skin: Skin is warm and dry.           Assessment & Plan:  URI:  Cough, congestion, x 4 days. Exam today with clear lungs, dry cough, vitals WNL. Suspect viral etiology but given history of pneumonia, will rule out today. Discussed to increase fluid intake, rest. Rx for Ladona Ridgel sent to pharmacy.  She will update if symptoms progress, precautions provided.   Pleas Koch, NP

## 2018-10-24 ENCOUNTER — Telehealth: Payer: Self-pay

## 2018-10-24 DIAGNOSIS — J181 Lobar pneumonia, unspecified organism: Principal | ICD-10-CM

## 2018-10-24 DIAGNOSIS — J189 Pneumonia, unspecified organism: Secondary | ICD-10-CM

## 2018-10-24 MED ORDER — DOXYCYCLINE HYCLATE 100 MG PO TABS: 100.0000 mg | ORAL_TABLET | Freq: Two times a day (BID) | ORAL | 0 refills | Status: DC

## 2018-10-24 NOTE — Telephone Encounter (Signed)
[place] radiology, Diane called report CXR; report taken to Gentry Fitz NP area and report is in [place].

## 2018-10-24 NOTE — Telephone Encounter (Signed)
Please notify patient that she has pneumonia. Start Doxycycline antibiotic for the infection. Take 1 tablet by mouth twice daily for 10 days.  Needs repeat chest xray in 6 weeks to ensure this has cleared, please notify.

## 2018-10-24 NOTE — Telephone Encounter (Signed)
Spoken and notified patient of Kate [name]'s comments. Patient verbalized understanding.  

## 2018-11-01 ENCOUNTER — Encounter: Payer: Self-pay | Admitting: Primary Care

## 2018-11-01 LAB — HM DIABETES EYE EXAM

## 2018-11-07 ENCOUNTER — Other Ambulatory Visit: Payer: Self-pay | Admitting: Cardiovascular Disease

## 2018-11-15 ENCOUNTER — Other Ambulatory Visit: Payer: Self-pay | Admitting: Internal Medicine

## 2018-11-15 DIAGNOSIS — E0821 Diabetes mellitus due to underlying condition with diabetic nephropathy: Secondary | ICD-10-CM

## 2018-11-19 ENCOUNTER — Other Ambulatory Visit: Payer: Self-pay | Admitting: Cardiovascular Disease

## 2018-11-24 ENCOUNTER — Encounter: Payer: Self-pay | Admitting: *Deleted

## 2018-11-30 ENCOUNTER — Encounter: Payer: Self-pay | Admitting: Primary Care

## 2018-11-30 ENCOUNTER — Ambulatory Visit: Payer: Medicare Other | Admitting: Primary Care

## 2018-11-30 DIAGNOSIS — Z6379 Other stressful life events affecting family and household: Secondary | ICD-10-CM | POA: Diagnosis not present

## 2018-11-30 MED ORDER — SERTRALINE HCL 25 MG PO TABS: 25.0000 mg | ORAL_TABLET | Freq: Every day | ORAL | 3 refills | Status: DC

## 2018-11-30 NOTE — Assessment & Plan Note (Signed)
Improved on sertraline 25 mg, continue same. Denies SI/HI. Refills sent to pharmacy.

## 2018-11-30 NOTE — Progress Notes (Signed)
Subjective:    Patient ID: [name] [name], female    DOB: Jan 09, 1932, 85 y.o.   MRN: [id number]  HPI  [name] [name] is an 85 year old female who presents today for follow up of anxiety.   She was last evaluated in mid to late November 2019 for complaints of stress and anxiety. 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 She endorsed symptoms of muscle tension, feeling nervous/stressed. Given continued symptoms of caregiver strain/stress she was treated with sertraline 25 mg daily.  Since her last visit she's noticed improvement. She's noticed that she's less irritable. Finds that she's able to react better during a stressful situation. She denies headaches, dizziness, SI/HI.   Review of Systems  Respiratory: Negative for shortness of breath.   Cardiovascular: Negative for chest pain.  Gastrointestinal: Negative for nausea.  Neurological: Negative for dizziness and headaches.  Psychiatric/Behavioral: Negative for suicidal ideas.       Past Medical History:  Diagnosis Date  . Allergy    Cipro, Plavix, Statins  . CAD (coronary artery disease)    a. s/p tandem Promus DES to LAD in 2009 by Dr. Olevia Perches;  b.  LHC (03/22/14):  prox LAD 30%, mid LAD 40%, LAD stent ok with dist 20% ISR, apical LAD occluded with L-L collats filling apical vessel (too small for PCI), mid RCA 20%, EF 70%.  Med Rx  . Diabetes mellitus    Diagnosed 2012  . Diverticulosis of colon (without mention of hemorrhage)   . GERD (gastroesophageal reflux disease)   . Hx of cardiovascular stress test    a. Nuclear (08/2013):  No ischemia, EF 80%, Normal  . Hx of echocardiogram    a. Echo (07/2013):  Mild LVH, vigorous LVF, EF 65-70%, Gr 1 DD, mild MR, mild to mod LAE  . Hyperlipidemia    intol of statins  . Hypertension   . NHL (non-Hodgkin's lymphoma) ([place]) dx'd 2003   chemo/xrt comp 2003  . Personal history of colonic polyps   . Proctitis   . Urticaria      Social History    Socioeconomic History  . Marital status: Married    Spouse name: Not on file  . Number of children: Not on file  . Years of education: Not on file  . Highest education level: Not on file  Occupational History  . Occupation: Retired    Fish farm manager: RETIRED  Social Needs  . Financial resource strain: Not on file  . Food insecurity:    Worry: Not on file    Inability: Not on file  . Transportation needs:    Medical: Not on file    Non-medical: Not on file  Tobacco Use  . Smoking status: Never Smoker  . Smokeless tobacco: Never Used  Substance and Sexual Activity  . Alcohol use: No  . Drug use: No  . Sexual activity: Never  Lifestyle  . Physical activity:    Days per week: Not on file    Minutes per session: Not on file  . Stress: Not on file  Relationships  . Social connections:    Talks on phone: Not on file    Gets together: Not on file    Attends religious service: Not on file    Active member of club or organization: Not on file    Attends meetings of clubs or organizations: Not on file    Relationship status: Not on file  . Intimate partner violence:  Fear of current or ex partner: Not on file    Emotionally abused: Not on file    Physically abused: Not on file    Forced sexual activity: Not on file  Other Topics Concern  . Not on file  Social History Narrative  . Not on file    Past Surgical History:  Procedure Laterality Date  . cardiac cath-neg    . CORONARY ANGIOPLASTY WITH STENT PLACEMENT     x 2  . dexa-neg    . KNEE SURGERY  10/2003   left  . laser surgery for cataracts-left    . LEFT HEART CATHETERIZATION WITH CORONARY ANGIOGRAM N/A 03/22/2014   Procedure: LEFT HEART CATHETERIZATION WITH CORONARY ANGIOGRAM;  Surgeon: Christopher D McAlhany, MD;  Location: [facility] CATH LAB;  Service: Cardiovascular;  Laterality: N/A;  . stress cardiolite    . VAGINAL HYSTERECTOMY     partial, fibroids, one ovary left    Family History  Problem Relation Age of  Onset  . Cancer Mother        jaw  . Hypertension Father   . Heart attack Father   . Heart attack Sister   . Colon cancer Neg Hx     Allergies  Allergen Reactions  . Other Rash and Anaphylaxis  . Shellfish Allergy Anaphylaxis and Rash  . Cephalexin     REACTION: trash and swelling REACTION: trash and swelling  . Ciprofloxacin     REACTION: u/k  . Clopidogrel Bisulfate     REACTION: swelling, rash  . Codeine Phosphate     REACTION: unspecified  . Ezetimibe-Simvastatin     REACTION: myalgias REACTION: myalgias  . Hydrocod Polst-Cpm Polst Er     REACTION: u/k  . Lidocaine Hives    ONLY TO ADHESIVE LIDOCAINE PATCHES. NO PROBLEM WITH INJECTABLE LIDOCAINE.  . Nitrofurantoin     REACTION: [place] REACTION: [place]  . Pregabalin     REACTION: felt bad REACTION: felt bad  . Propoxyphene N-Acetaminophen     REACTION: [place]  . Rofecoxib     unk unk   . Sulfamethoxazole     REACTION: unspecified  . Sulfonamide Derivatives     REACTION: unspecified  . Celecoxib Rash    REACTION: [place] REACTION: [place]  . Codeine Rash    REACTION: [place] REACTION: [place]  . Hydrocod Polst-Cpm Polst Er Rash  . Propoxyphene Rash  . Sulfa Antibiotics Rash    REACTION: unspecified REACTION: unspecified    Current Outpatient Medications on File Prior to Visit  Medication Sig Dispense Refill  . aspirin (ASPIRIN LOW DOSE) 81 MG tablet Take 81 mg by mouth daily.      . benzonatate (TESSALON) 200 MG capsule Take 1 capsule (200 mg total) by mouth 3 (three) times daily as needed for cough. 15 capsule 0  . blood glucose meter kit and supplies Dispense based on patient and insurance preference. Use up to times times daily as directed. (FOR ICD-10 E10.9, E11.9). 1 each 0  . brimonidine-timolol (COMBIGAN) 0.2-0.5 % ophthalmic solution Place 1 drop into both eyes every 12 (twelve) hours.    . Cholecalciferol (VITAMIN D-3) 1000 UNITS CAPS Take 1 capsule by mouth daily.    . cyclobenzaprine (FLEXERIL) 5 MG tablet Take 1 tablet  (5 mg total) by mouth 3 (three) times daily as needed for muscle spasms. 30 tablet 0  . doxycycline (VIBRA-TABS) 100 MG tablet Take 1 tablet (100 mg total) by mouth 2 (two) times daily. 20 tablet 0  . ezetimibe (ZETIA)   10 MG tablet TAKE ONE TABLET BY MOUTH EVERY DAY 90 tablet 2  . fexofenadine (ALLEGRA) 180 MG tablet Take 180 mg by mouth as needed for allergies or rhinitis.    . fluticasone (FLONASE) 50 MCG/ACT nasal spray Place 2 sprays into both nostrils as needed for allergies or rhinitis.    . glipiZIDE (GLUCOTROL XL) 10 MG 24 hr tablet TAKE ONE TABLET BY MOUTH EVERY DAY WITH BREAKFAST 90 tablet 0  . glucose blood (ONE TOUCH ULTRA TEST) test strip USE UP TO 4 TIMES DAILY TO CHECK BLOOD SUGAR AS DIRECTED 100 each 2  . isosorbide mononitrate (IMDUR) 60 MG 24 hr tablet Take 1 tablet (60 mg total) by mouth daily. 90 tablet 3  . losartan (COZAAR) 50 MG tablet TAKE ONE TABLET BY MOUTH DAILY 90 tablet 2  . metFORMIN (GLUCOPHAGE-XR) 750 MG 24 hr tablet Take 1 tablet by mouth with breakfast for diabetes. 90 tablet 1  . metoprolol succinate (TOPROL-XL) 100 MG 24 hr tablet TAKE ONE TABLET BY MOUTH EVERY MORNING WITH OR IMMEDIATELY FOLLOWING A MEAL 90 tablet 3  . nitroGLYCERIN (NITROSTAT) 0.4 MG SL tablet Place 1 tablet (0.4 mg total) under the tongue every 5 (five) minutes as needed for chest pain. 26 tablet 0  . OVER THE COUNTER MEDICATION Take 1 tablet by mouth daily. Med name: VISION FORMULA    . pantoprazole (PROTONIX) 40 MG tablet TAKE ONE TABLET BY MOUTH DAILY 30 tablet 3  . timolol (TIMOPTIC) 0.5 % ophthalmic solution     . trimethoprim (TRIMPEX) 100 MG tablet Take 100 mg by mouth 2 (two) times daily as needed (UTI).     . vitamin B-12 (CYANOCOBALAMIN) 1000 MCG tablet Take 1,000 mcg by mouth daily.    . [DISCONTINUED] loratadine (CLARITIN) 10 MG tablet Take 10 mg by mouth as needed.      No current facility-administered medications on file prior to visit.     BP 126/76   Pulse 64   Temp 98.1  F (36.7 C) (Oral)   Ht 5' 8" (1.727 m)   Wt 166 lb 8 oz (75.5 kg)   SpO2 95%   BMI 25.32 kg/m    Objective:   Physical Exam  Constitutional: She appears well-nourished.  Cardiovascular: Normal rate and regular rhythm.  Respiratory: Effort normal and breath sounds normal.  Skin: Skin is warm and dry.  Psychiatric: She has a normal mood and affect.           Assessment & Plan:   

## 2018-11-30 NOTE — Patient Instructions (Signed)
Continue sertraline 25 mg tablets for anxiety/stress.  It was a pleasure to see you today!

## 2018-12-05 ENCOUNTER — Other Ambulatory Visit: Payer: Self-pay | Admitting: Cardiovascular Disease

## 2019-01-03 ENCOUNTER — Other Ambulatory Visit: Payer: Self-pay | Admitting: Primary Care

## 2019-01-31 ENCOUNTER — Ambulatory Visit: Payer: Medicare Other | Admitting: Primary Care

## 2019-01-31 ENCOUNTER — Encounter: Payer: Self-pay | Admitting: Primary Care

## 2019-01-31 VITALS — BP 130/82 | HR 61 | Temp 98.2°F | Ht 68.0 in | Wt 169.5 lb

## 2019-01-31 DIAGNOSIS — E118 Type 2 diabetes mellitus with unspecified complications: Secondary | ICD-10-CM

## 2019-01-31 DIAGNOSIS — E119 Type 2 diabetes mellitus without complications: Secondary | ICD-10-CM

## 2019-01-31 LAB — POCT GLYCOSYLATED HEMOGLOBIN (HGB A1C): Hemoglobin A1C: 7 % — AB (ref 4.0–5.6)

## 2019-01-31 NOTE — Patient Instructions (Signed)
Continue to hold Metformin due to side effects. Continue Glipizide ER 10 mg daily.  Continue to monitor your blood sugars.  Continue to work on [employer].  Start exercising. You should be getting 150 minutes of exercise weekly.  Please schedule a physical with me in 6 months. You may also schedule a lab only appointment 3-4 days prior. We will discuss your lab results in detail during your physical.  It was a pleasure to see you today!

## 2019-01-31 NOTE — Assessment & Plan Note (Signed)
A1C today of 7.0 which is a slight increase from 6.7 six months ago.  Could not tolerate Metformin ER 750 mg, is compliant to Glipizide ER 10 mg.   Foot and eye exam UTD. Pneumonia vaccination due in Fall 2019. Cannot tolerate statin. Managed on ARB.  Given CAD a A1C of 6.5 or less is desirable, but given her age, intolerance to Metformin, and numerous medications we will continue with Glipizide ER 10 mg alone. Follow up in 6 months.

## 2019-01-31 NOTE — Progress Notes (Signed)
Subjective:    Patient ID: [name] [name], female    DOB: Sep 02, 1932, 85 y.o.   MRN: [id number]  HPI  [name] [name] is an 85 year old female who presents today for follow up of diabetes.  Current medications include: Metformin ER 750 mg daily, Glipizide ER 10 mg daily. She stopped taking Metformin 4-5 weeks ago due to GI upset.   She is checking her blood glucose one times daily and is getting readings of: AM fasting: 120-150  Highest reading: 190 Lowest reading: 90  Last A1C: 6.7 in September 2019, 7.0 today Last Eye Exam: Completed in December 2019 Last Foot Exam: Due in June 2020 Pneumonia Vaccination: Completed in September 2015 ACE/ARB: Losartan Statin: None. On Zetia due to statin intolerance  Diet currently consists of:  Breakfast: Egg, bacon, oatmeal, cereal, grits Lunch: Sandwich  Dinner: Meat, vegetable, starch  Snacks: Graham crackers, Ritz crackers, peanut butter Desserts: Daily, small portions  Beverages: Water  Exercise: She walks daily  BP Readings from Last 3 Encounters:  01/31/19 130/82  11/30/18 126/76  10/23/18 120/68     Review of Systems  Respiratory: Negative for shortness of breath.   Cardiovascular: Negative for chest pain.  Gastrointestinal:       GI upset with Metformin, resolved after cessation.   Neurological: Negative for numbness.       Past Medical History:  Diagnosis Date  . Allergy    Cipro, Plavix, Statins  . CAD (coronary artery disease)    a. s/p tandem Promus DES to LAD in 2009 by Dr. Olevia Perches;  b.  LHC (03/22/14):  prox LAD 30%, mid LAD 40%, LAD stent ok with dist 20% ISR, apical LAD occluded with L-L collats filling apical vessel (too small for PCI), mid RCA 20%, EF 70%.  Med Rx  . Diabetes mellitus    Diagnosed 2012  . Diverticulosis of colon (without mention of hemorrhage)   . GERD (gastroesophageal reflux disease)   . Hx of cardiovascular stress test    a. Nuclear (08/2013):  No ischemia, EF 80%, Normal  . Hx of  echocardiogram    a. Echo (07/2013):  Mild LVH, vigorous LVF, EF 65-70%, Gr 1 DD, mild MR, mild to mod LAE  . Hyperlipidemia    intol of statins  . Hypertension   . NHL (non-Hodgkin's lymphoma) ([place]) dx'd 2003   chemo/xrt comp 2003  . Personal history of colonic polyps   . Proctitis   . Urticaria      Social History   Socioeconomic History  . Marital status: Married    Spouse name: Not on file  . Number of children: Not on file  . Years of education: Not on file  . Highest education level: Not on file  Occupational History  . Occupation: Retired    Fish farm manager: RETIRED  Social Needs  . Financial resource strain: Not on file  . Food insecurity:    Worry: Not on file    Inability: Not on file  . Transportation needs:    Medical: Not on file    Non-medical: Not on file  Tobacco Use  . Smoking status: Never Smoker  . Smokeless tobacco: Never Used  Substance and Sexual Activity  . Alcohol use: No  . Drug use: No  . Sexual activity: Never  Lifestyle  . Physical activity:    Days per week: Not on file    Minutes per session: Not on file  . Stress: Not on file  Relationships  .  Social connections:    Talks on phone: Not on file    Gets together: Not on file    Attends religious service: Not on file    Active member of club or organization: Not on file    Attends meetings of clubs or organizations: Not on file    Relationship status: Not on file  . Intimate partner violence:    Fear of current or ex partner: Not on file    Emotionally abused: Not on file    Physically abused: Not on file    Forced sexual activity: Not on file  Other Topics Concern  . Not on file  Social History Narrative  . Not on file    Past Surgical History:  Procedure Laterality Date  . cardiac cath-neg    . CORONARY ANGIOPLASTY WITH STENT PLACEMENT     x 2  . dexa-neg    . KNEE SURGERY  10/2003   left  . laser surgery for cataracts-left    . LEFT HEART CATHETERIZATION WITH CORONARY  ANGIOGRAM N/A 03/22/2014   Procedure: LEFT HEART CATHETERIZATION WITH CORONARY ANGIOGRAM;  Surgeon: Burnell Blanks, MD;  Location: [facility] CATH LAB;  Service: Cardiovascular;  Laterality: N/A;  . stress cardiolite    . VAGINAL HYSTERECTOMY     partial, fibroids, one ovary left    Family History  Problem Relation Age of Onset  . Cancer Mother        jaw  . Hypertension Father   . Heart attack Father   . Heart attack Sister   . Colon cancer Neg Hx     Allergies  Allergen Reactions  . Other Rash and Anaphylaxis  . Shellfish Allergy Anaphylaxis and Rash  . Cephalexin     REACTION: trash and swelling REACTION: trash and swelling  . Ciprofloxacin     REACTION: u/k  . Clopidogrel Bisulfate     REACTION: swelling, rash  . Codeine Phosphate     REACTION: unspecified  . Ezetimibe-Simvastatin     REACTION: myalgias REACTION: myalgias  . Hydrocod Polst-Cpm Polst Er     REACTION: u/k  . Lidocaine Hives    ONLY TO ADHESIVE LIDOCAINE PATCHES. NO PROBLEM WITH INJECTABLE LIDOCAINE.  Marland Kitchen Nitrofurantoin     REACTION: [place] REACTION: [place]  . Pregabalin     REACTION: felt bad REACTION: felt bad  . Propoxyphene N-Acetaminophen     REACTION: [place]  . Rofecoxib     unk unk   . Sulfamethoxazole     REACTION: unspecified  . Sulfonamide Derivatives     REACTION: unspecified  . Celecoxib Rash    REACTION: [place] REACTION: [place]  . Codeine Rash    REACTION: [place] REACTION: [place]  . Hydrocod Polst-Cpm Polst Er Rash  . Propoxyphene Rash  . Sulfa Antibiotics Rash    REACTION: unspecified REACTION: unspecified    Current Outpatient Medications on File Prior to Visit  Medication Sig Dispense Refill  . aspirin (ASPIRIN LOW DOSE) 81 MG tablet Take 81 mg by mouth daily.      . blood glucose meter kit and supplies Dispense based on patient and insurance preference. Use up to times times daily as directed. (FOR ICD-10 E10.9, E11.9). 1 each 0  . brimonidine-timolol (COMBIGAN) 0.2-0.5 % ophthalmic solution  Place 1 drop into both eyes every 12 (twelve) hours.    . Cholecalciferol (VITAMIN D-3) 1000 UNITS CAPS Take 1 capsule by mouth daily.    . cyclobenzaprine (FLEXERIL) 5 MG tablet Take 1 tablet (5  mg total) by mouth 3 (three) times daily as needed for muscle spasms. 30 tablet 0  . ezetimibe (ZETIA) 10 MG tablet TAKE ONE TABLET BY MOUTH DAILY 90 tablet 2  . fexofenadine (ALLEGRA) 180 MG tablet Take 180 mg by mouth as needed for allergies or rhinitis.    . fluticasone (FLONASE) 50 MCG/ACT nasal spray Place 2 sprays into both nostrils as needed for allergies or rhinitis.    Marland Kitchen glipiZIDE (GLUCOTROL XL) 10 MG 24 hr tablet TAKE ONE TABLET BY MOUTH EVERY DAY WITH BREAKFAST 90 tablet 0  . glucose blood (ONE TOUCH ULTRA TEST) test strip USE UP TO 4 TIMES DAILY TO CHECK BLOOD SUGAR AS DIRECTED 100 each 2  . isosorbide mononitrate (IMDUR) 60 MG 24 hr tablet Take 1 tablet (60 mg total) by mouth daily. 90 tablet 3  . losartan (COZAAR) 50 MG tablet TAKE ONE TABLET BY MOUTH DAILY 90 tablet 2  . metFORMIN (GLUCOPHAGE-XR) 750 MG 24 hr tablet Take 1 tablet by mouth with breakfast for diabetes. 90 tablet 1  . metoprolol succinate (TOPROL-XL) 100 MG 24 hr tablet TAKE ONE TABLET BY MOUTH EVERY MORNING WITH OR IMMEDIATELY FOLLOWING A MEAL 90 tablet 3  . nitroGLYCERIN (NITROSTAT) 0.4 MG SL tablet Place 1 tablet (0.4 mg total) under the tongue every 5 (five) minutes as needed for chest pain. 26 tablet 0  . OVER THE COUNTER MEDICATION Take 1 tablet by mouth daily. Med name: VISION FORMULA    . pantoprazole (PROTONIX) 40 MG tablet TAKE ONE TABLET BY MOUTH DAILY 90 tablet 1  . sertraline (ZOLOFT) 25 MG tablet Take 1 tablet (25 mg total) by mouth daily. For stress. 90 tablet 3  . timolol (TIMOPTIC) 0.5 % ophthalmic solution     . trimethoprim (TRIMPEX) 100 MG tablet Take 100 mg by mouth 2 (two) times daily as needed (UTI).     . vitamin B-12 (CYANOCOBALAMIN) 1000 MCG tablet Take 1,000 mcg by mouth daily.    . [DISCONTINUED]  loratadine (CLARITIN) 10 MG tablet Take 10 mg by mouth as needed.      No current facility-administered medications on file prior to visit.     BP 130/82   Pulse 61   Temp 98.2 F (36.8 C) (Oral)   Ht _0  (1.727 m)   Wt 169 lb 8 oz (76.9 kg)   SpO2 97%   BMI 25.77 kg/m    Objective:   Physical Exam  Constitutional: She appears well-nourished.  Neck: Neck supple.  Cardiovascular: Normal rate and regular rhythm.  Respiratory: Effort normal and breath sounds normal.  Skin: Skin is warm and dry.  Psychiatric: She has a normal mood and affect.           Assessment & Plan:

## 2019-02-16 ENCOUNTER — Other Ambulatory Visit: Payer: Self-pay | Admitting: Primary Care

## 2019-02-16 DIAGNOSIS — E0821 Diabetes mellitus due to underlying condition with diabetic nephropathy: Secondary | ICD-10-CM

## 2019-03-13 ENCOUNTER — Ambulatory Visit (INDEPENDENT_AMBULATORY_CARE_PROVIDER_SITE_OTHER): Payer: Medicare Other | Admitting: Primary Care

## 2019-03-13 ENCOUNTER — Other Ambulatory Visit: Payer: Self-pay

## 2019-03-13 ENCOUNTER — Telehealth: Payer: Self-pay

## 2019-03-13 DIAGNOSIS — M542 Cervicalgia: Secondary | ICD-10-CM

## 2019-03-13 DIAGNOSIS — M25511 Pain in right shoulder: Secondary | ICD-10-CM

## 2019-03-13 DIAGNOSIS — M25519 Pain in unspecified shoulder: Secondary | ICD-10-CM | POA: Insufficient documentation

## 2019-03-13 NOTE — Patient Instructions (Signed)
You may take Tylenol and naproxen as needed for pain. Do not exceed 1000 mg of naproxen and 3000 mg of Tylenol in 24 hours.   You may take the cyclobenzaprine at bedtime as needed for sleep/rest.  Please call me in 2-4 weeks if no continued improvement in pain.  It was a pleasure to speak with you today! Allie Bossier, NP-C

## 2019-03-13 NOTE — Progress Notes (Signed)
Subjective:    Patient ID: [name] [name], female    DOB: [birth year], 85 y.o.   MRN: [id number]  HPI  Virtual Visit via Video Note  I connected with [name] [name] on 03/13/19 at  4:00 PM EDT by a video enabled telemedicine application and verified that I am speaking with the correct person using two identifiers.   I discussed the limitations of evaluation and management by telemedicine and the availability of in person appointments. The patient expressed understanding and agreed to proceed.  Patient unable to get video function to work on her device, therefore, we proceed with a phone visit.  History of Present Illness:  [name] [name] is an 85 year old female with a history of  chronic right hip pain, chronic back pain, hypertension who presents today via phone with a chief complaint of hip pain.  She fell walking down the steps in the garage about one month ago. She missed the bottom step when walking down the stairs and hit the floor of her garage with her right knee and shoulder. She has pain to the right shoulder, right side of the neck, and right chest beneath her breast.   She has some older prescriptions from prior visits with our clinic and is wanting to know what she may be able to take. She endorses full range of motion of her shoulder, does have pain if she fully extends her arm above her head. She has minor pain to her neck with moving her head to the right. She denies bruising and skin breakdown to her shoulder, neck, knee, and chest.   She's taken Tylenol with some improvement. She has naproxen and cyclobenzaprine at home. She denies hitting her head, changes in LOC, dizziness. Overall feeling better now and was ambulatory immediately after incident. Mostly just right shoulder pain and neck pain now.   Observations/Objective:  Calm. Alert and oriented. No distress.  Assessment and Plan:  Overall seems to be doing well with recent fall. No indication of fracture based off  of HPI, she has nearly full ROM of all joints with minimal bruising to her knee  We will have her alternate Naproxen and Tylenol as needed for pain. Okay to use cyclobenzaprine HS if needed for sleep/discomfort. Given the recent Covid-19 pandemic we agreed that she should stay home but we also discussed that if she continues to experience right shoulder/neck pain then she will need an xray. She is overall better and feels like this pain is more so her arthritis type pain. She will update.   Follow Up Instructions:  You may take Tylenol and naproxen as needed for pain. Do not exceed 1000 mg of naproxen and 3000 mg of Tylenol in 24 hours.   You may take the cyclobenzaprine at bedtime as needed for sleep/rest.  Please call me in 2-4 weeks if no continued improvement in pain.  It was a pleasure to speak with you today! Allie Bossier, NP-C    I discussed the assessment and treatment plan with the patient. The patient was provided an opportunity to ask questions and all were answered. The patient agreed with the plan and demonstrated an understanding of the instructions.   The patient was advised to call back or seek an in-person evaluation if the symptoms worsen or if the condition fails to improve as anticipated.     Pleas Koch, NP    Review of Systems  Eyes: Negative for visual disturbance.  Musculoskeletal: Positive for arthralgias and  neck pain.  Neurological: Negative for weakness, numbness and headaches.       Past Medical History:  Diagnosis Date  . Allergy    Cipro, Plavix, Statins  . CAD (coronary artery disease)    a. s/p tandem Promus DES to LAD in 2009 by Dr. Olevia Perches;  b.  LHC (03/22/14):  prox LAD 30%, mid LAD 40%, LAD stent ok with dist 20% ISR, apical LAD occluded with L-L collats filling apical vessel (too small for PCI), mid RCA 20%, EF 70%.  Med Rx  . Diabetes mellitus    Diagnosed 2012  . Diverticulosis of colon (without mention of hemorrhage)   . GERD  (gastroesophageal reflux disease)   . Hx of cardiovascular stress test    a. Nuclear (08/2013):  No ischemia, EF 80%, Normal  . Hx of echocardiogram    a. Echo (07/2013):  Mild LVH, vigorous LVF, EF 65-70%, Gr 1 DD, mild MR, mild to mod LAE  . Hyperlipidemia    intol of statins  . Hypertension   . NHL (non-Hodgkin's lymphoma) ([place]) dx'd 2003   chemo/xrt comp 2003  . Personal history of colonic polyps   . Proctitis   . Urticaria      Social History   Socioeconomic History  . Marital status: Married    Spouse name: Not on file  . Number of children: Not on file  . Years of education: Not on file  . Highest education level: Not on file  Occupational History  . Occupation: Retired    Fish farm manager: RETIRED  Social Needs  . Financial resource strain: Not on file  . Food insecurity:    Worry: Not on file    Inability: Not on file  . Transportation needs:    Medical: Not on file    Non-medical: Not on file  Tobacco Use  . Smoking status: Never Smoker  . Smokeless tobacco: Never Used  Substance and Sexual Activity  . Alcohol use: No  . Drug use: No  . Sexual activity: Never  Lifestyle  . Physical activity:    Days per week: Not on file    Minutes per session: Not on file  . Stress: Not on file  Relationships  . Social connections:    Talks on phone: Not on file    Gets together: Not on file    Attends religious service: Not on file    Active member of club or organization: Not on file    Attends meetings of clubs or organizations: Not on file    Relationship status: Not on file  . Intimate partner violence:    Fear of current or ex partner: Not on file    Emotionally abused: Not on file    Physically abused: Not on file    Forced sexual activity: Not on file  Other Topics Concern  . Not on file  Social History Narrative  . Not on file    Past Surgical History:  Procedure Laterality Date  . cardiac cath-neg    . CORONARY ANGIOPLASTY WITH STENT PLACEMENT     x 2   . dexa-neg    . KNEE SURGERY  10/2003   left  . laser surgery for cataracts-left    . LEFT HEART CATHETERIZATION WITH CORONARY ANGIOGRAM N/A 03/22/2014   Procedure: LEFT HEART CATHETERIZATION WITH CORONARY ANGIOGRAM;  Surgeon: Burnell Blanks, MD;  Location: [facility] CATH LAB;  Service: Cardiovascular;  Laterality: N/A;  . stress cardiolite    . VAGINAL HYSTERECTOMY  partial, fibroids, one ovary left    Family History  Problem Relation Age of Onset  . Cancer Mother        jaw  . Hypertension Father   . Heart attack Father   . Heart attack Sister   . Colon cancer Neg Hx     Allergies  Allergen Reactions  . Other Rash and Anaphylaxis  . Shellfish Allergy Anaphylaxis and Rash  . Cephalexin     REACTION: trash and swelling REACTION: trash and swelling  . Ciprofloxacin     REACTION: u/k  . Clopidogrel Bisulfate     REACTION: swelling, rash  . Codeine Phosphate     REACTION: unspecified  . Ezetimibe-Simvastatin     REACTION: myalgias REACTION: myalgias  . Hydrocod Polst-Cpm Polst Er     REACTION: u/k  . Lidocaine Hives    ONLY TO ADHESIVE LIDOCAINE PATCHES. NO PROBLEM WITH INJECTABLE LIDOCAINE.  Marland Kitchen Nitrofurantoin     REACTION: [place] REACTION: [place]  . Pregabalin     REACTION: felt bad REACTION: felt bad  . Propoxyphene N-Acetaminophen     REACTION: [place]  . Rofecoxib     unk unk   . Sulfamethoxazole     REACTION: unspecified  . Sulfonamide Derivatives     REACTION: unspecified  . Celecoxib Rash    REACTION: [place] REACTION: [place]  . Codeine Rash    REACTION: [place] REACTION: [place]  . Hydrocod Polst-Cpm Polst Er Rash  . Propoxyphene Rash  . Sulfa Antibiotics Rash    REACTION: unspecified REACTION: unspecified    Current Outpatient Medications on File Prior to Visit  Medication Sig Dispense Refill  . aspirin (ASPIRIN LOW DOSE) 81 MG tablet Take 81 mg by mouth daily.      . blood glucose meter kit and supplies Dispense based on patient and insurance preference. Use up to  times times daily as directed. (FOR ICD-10 E10.9, E11.9). 1 each 0  . brimonidine-timolol (COMBIGAN) 0.2-0.5 % ophthalmic solution Place 1 drop into both eyes every 12 (twelve) hours.    . Cholecalciferol (VITAMIN D-3) 1000 UNITS CAPS Take 1 capsule by mouth daily.    . cyclobenzaprine (FLEXERIL) 5 MG tablet Take 1 tablet (5 mg total) by mouth 3 (three) times daily as needed for muscle spasms. 30 tablet 0  . ezetimibe (ZETIA) 10 MG tablet TAKE ONE TABLET BY MOUTH DAILY 90 tablet 2  . fexofenadine (ALLEGRA) 180 MG tablet Take 180 mg by mouth as needed for allergies or rhinitis.    . fluticasone (FLONASE) 50 MCG/ACT nasal spray Place 2 sprays into both nostrils as needed for allergies or rhinitis.    Marland Kitchen glipiZIDE (GLUCOTROL XL) 10 MG 24 hr tablet TAKE ONE TABLET BY MOUTH EVERY MORNING WITH BREAKFAST 90 tablet 0  . glucose blood (ONE TOUCH ULTRA TEST) test strip USE UP TO 4 TIMES DAILY TO CHECK BLOOD SUGAR AS DIRECTED 100 each 2  . isosorbide mononitrate (IMDUR) 60 MG 24 hr tablet Take 1 tablet (60 mg total) by mouth daily. 90 tablet 3  . losartan (COZAAR) 50 MG tablet TAKE ONE TABLET BY MOUTH DAILY 90 tablet 2  . metoprolol succinate (TOPROL-XL) 100 MG 24 hr tablet TAKE ONE TABLET BY MOUTH EVERY MORNING WITH OR IMMEDIATELY FOLLOWING A MEAL 90 tablet 3  . nitroGLYCERIN (NITROSTAT) 0.4 MG SL tablet Place 1 tablet (0.4 mg total) under the tongue every 5 (five) minutes as needed for chest pain. 26 tablet 0  . OVER THE COUNTER MEDICATION Take 1  tablet by mouth daily. Med name: VISION FORMULA    . pantoprazole (PROTONIX) 40 MG tablet TAKE ONE TABLET BY MOUTH DAILY 90 tablet 1  . sertraline (ZOLOFT) 25 MG tablet Take 1 tablet (25 mg total) by mouth daily. For stress. 90 tablet 3  . timolol (TIMOPTIC) 0.5 % ophthalmic solution     . trimethoprim (TRIMPEX) 100 MG tablet Take 100 mg by mouth 2 (two) times daily as needed (UTI).     . vitamin B-12 (CYANOCOBALAMIN) 1000 MCG tablet Take 1,000 mcg by mouth daily.     . [DISCONTINUED] loratadine (CLARITIN) 10 MG tablet Take 10 mg by mouth as needed.      No current facility-administered medications on file prior to visit.     There were no vitals taken for this visit.   Objective:   Physical Exam  Constitutional: She is oriented to person, place, and time.  Respiratory: Effort normal.  Neurological: She is alert and oriented to person, place, and time.  Psychiatric: She has a normal mood and affect.           Assessment & Plan:

## 2019-03-13 NOTE — Assessment & Plan Note (Signed)
Acute on chronic from fall one month ago.  Overall seems to be doing well with recent fall. No indication of fracture based off of HPI, she has nearly full ROM of all joints with minimal bruising to her knee  We will have her alternate Naproxen and Tylenol as needed for pain. Okay to use cyclobenzaprine HS if needed for sleep/discomfort. Given the recent Covid-19 pandemic we agreed that she should stay home but we also discussed that if she continues to experience right shoulder/neck pain then she will need an xray. She is overall better and feels like this pain is more so her arthritis type pain. She will update.

## 2019-03-13 NOTE — Telephone Encounter (Signed)
Inbound call to triage - pt reports right side pain related to fall approx. 4 weeks ago. Patient states she missed a step in her garage.   Unable to complete virtual video appointment.   Phone appt scheduled 03/13/19 @ 1600.

## 2019-03-13 NOTE — Assessment & Plan Note (Signed)
Overall seems to be doing well with recent fall. No indication of fracture based off of HPI, she has nearly full ROM of all joints with minimal bruising to her knee  We will have her alternate Naproxen and Tylenol as needed for pain. Okay to use cyclobenzaprine HS if needed for sleep/discomfort. Given the recent Covid-19 pandemic we agreed that she should stay home but we also discussed that if she continues to experience right shoulder/neck pain then she will need an xray. She is overall better and feels like this pain is more so her arthritis type pain. She will update.

## 2019-04-03 ENCOUNTER — Other Ambulatory Visit: Payer: Self-pay

## 2019-04-03 DIAGNOSIS — J302 Other seasonal allergic rhinitis: Secondary | ICD-10-CM

## 2019-04-03 MED ORDER — FEXOFENADINE HCL 180 MG PO TABS: 180.0000 mg | ORAL_TABLET | ORAL | 0 refills | Status: AC | PRN

## 2019-04-03 NOTE — Telephone Encounter (Signed)
Pt request refill on allegra to [facility]. Pt also said she is better from fall that pt talked with Gentry Fitz NP on 03/13/19 bu still not back to her normal self. Pt said she will continue to alternate naproxen and tylenol and if does not completely clear will cb for xray.

## 2019-04-03 NOTE — Telephone Encounter (Signed)
Noted, refill sent to pharmacy. 

## 2019-04-03 NOTE — Telephone Encounter (Signed)
Rx have not been  prescribed. Last office visit on 03/13/2019. Next future appointment on 08/01/2019

## 2019-04-18 LAB — HM MAMMOGRAPHY

## 2019-04-20 ENCOUNTER — Encounter: Payer: Self-pay | Admitting: Primary Care

## 2019-04-24 ENCOUNTER — Other Ambulatory Visit: Payer: Self-pay

## 2019-04-24 ENCOUNTER — Encounter: Payer: Self-pay | Admitting: Primary Care

## 2019-04-24 ENCOUNTER — Ambulatory Visit: Payer: Medicare Other | Admitting: Primary Care

## 2019-04-24 DIAGNOSIS — M542 Cervicalgia: Secondary | ICD-10-CM | POA: Diagnosis not present

## 2019-04-24 DIAGNOSIS — M25511 Pain in right shoulder: Secondary | ICD-10-CM

## 2019-04-24 NOTE — Assessment & Plan Note (Signed)
Chronic, intermittent.  Exam today consistent for bursitis secondary to arthritis.  Continue with Tylenol 1000 mg in the AM, discussed to add in 500 mg HS. Will have her evaluated by sports medicine for injection/further treatment.

## 2019-04-24 NOTE — Assessment & Plan Note (Signed)
Chronic, intermittent. History of DDD to cervical spine. Continue Tylenol 1000 mg, add in 500 mg HS. Overall is doing well with this treatment. No alarm signs.  Recommend PT but options are limited.  Will have her see Sports Medicine for evaluation.

## 2019-04-24 NOTE — Progress Notes (Signed)
Subjective:    Patient ID: [name] [name], female    DOB: [birth year], 85 y.o.   MRN: [id number]  HPI  [name] [name] is a 85 year old female with a history of osteoarthritis, chronic neck and shoulder pain, chronic back pain who presents today with a chief complaint of shoulder and neck pain.  She is currently managed on Tylenol as needed. She is taking 1000 of Tylenol in the morning which helps with chronic pain. She didn't notice any improvement with cyclobenzaprine and doesn't take NSAID's. She's been applying deep blu cream and Hempvanna oil as well without improvement.   Her pain begins to the right shoulder with radiation up to her right lateral and posterior neck. She will notice more pain with neck extension or right lateral neck rotation. She underwent xray of her cervical spine in November 2019 with DDD of C6-7, mild-moderate foraminal narrowing bilaterally to C6-7.   She denies numbness/tinligng to neck, shoulders, extremity. She denies injury or fall since April 2020.   Review of Systems  Musculoskeletal: Positive for arthralgias and neck pain.  Skin: Negative for color change.  Neurological: Negative for weakness and numbness.       Past Medical History:  Diagnosis Date  . Allergy    Cipro, Plavix, Statins  . CAD (coronary artery disease)    a. s/p tandem Promus DES to LAD in 2009 by Dr. Olevia Perches;  b.  LHC (03/22/14):  prox LAD 30%, mid LAD 40%, LAD stent ok with dist 20% ISR, apical LAD occluded with L-L collats filling apical vessel (too small for PCI), mid RCA 20%, EF 70%.  Med Rx  . Diabetes mellitus    Diagnosed 2012  . Diverticulosis of colon (without mention of hemorrhage)   . GERD (gastroesophageal reflux disease)   . Hx of cardiovascular stress test    a. Nuclear (08/2013):  No ischemia, EF 80%, Normal  . Hx of echocardiogram    a. Echo (07/2013):  Mild LVH, vigorous LVF, EF 65-70%, Gr 1 DD, mild MR, mild to mod LAE  . Hyperlipidemia    intol of statins  .  Hypertension   . NHL (non-Hodgkin's lymphoma) ([place]) dx'd 2003   chemo/xrt comp 2003  . Personal history of colonic polyps   . Proctitis   . Urticaria      Social History   Socioeconomic History  . Marital status: Married    Spouse name: Not on file  . Number of children: Not on file  . Years of education: Not on file  . Highest education level: Not on file  Occupational History  . Occupation: Retired    Fish farm manager: RETIRED  Social Needs  . Financial resource strain: Not on file  . Food insecurity:    Worry: Not on file    Inability: Not on file  . Transportation needs:    Medical: Not on file    Non-medical: Not on file  Tobacco Use  . Smoking status: Never Smoker  . Smokeless tobacco: Never Used  Substance and Sexual Activity  . Alcohol use: No  . Drug use: No  . Sexual activity: Never  Lifestyle  . Physical activity:    Days per week: Not on file    Minutes per session: Not on file  . Stress: Not on file  Relationships  . Social connections:    Talks on phone: Not on file    Gets together: Not on file    Attends religious service: Not on file  Active member of club or organization: Not on file    Attends meetings of clubs or organizations: Not on file    Relationship status: Not on file  . Intimate partner violence:    Fear of current or ex partner: Not on file    Emotionally abused: Not on file    Physically abused: Not on file    Forced sexual activity: Not on file  Other Topics Concern  . Not on file  Social History Narrative  . Not on file    Past Surgical History:  Procedure Laterality Date  . cardiac cath-neg    . CORONARY ANGIOPLASTY WITH STENT PLACEMENT     x 2  . dexa-neg    . KNEE SURGERY  10/2003   left  . laser surgery for cataracts-left    . LEFT HEART CATHETERIZATION WITH CORONARY ANGIOGRAM N/A 03/22/2014   Procedure: LEFT HEART CATHETERIZATION WITH CORONARY ANGIOGRAM;  Surgeon: Burnell Blanks, MD;  Location: [facility] CATH LAB;   Service: Cardiovascular;  Laterality: N/A;  . stress cardiolite    . VAGINAL HYSTERECTOMY     partial, fibroids, one ovary left    Family History  Problem Relation Age of Onset  . Cancer Mother        jaw  . Hypertension Father   . Heart attack Father   . Heart attack Sister   . Colon cancer Neg Hx     Allergies  Allergen Reactions  . Other Rash and Anaphylaxis  . Shellfish Allergy Anaphylaxis and Rash  . Cephalexin     REACTION: trash and swelling REACTION: trash and swelling  . Ciprofloxacin     REACTION: u/k  . Clopidogrel Bisulfate     REACTION: swelling, rash  . Codeine Phosphate     REACTION: unspecified  . Ezetimibe-Simvastatin     REACTION: myalgias REACTION: myalgias  . Hydrocod Polst-Cpm Polst Er     REACTION: u/k  . Lidocaine Hives    ONLY TO ADHESIVE LIDOCAINE PATCHES. NO PROBLEM WITH INJECTABLE LIDOCAINE.  Marland Kitchen Nitrofurantoin     REACTION: [place] REACTION: [place]  . Pregabalin     REACTION: felt bad REACTION: felt bad  . Propoxyphene N-Acetaminophen     REACTION: [place]  . Rofecoxib     unk unk   . Sulfamethoxazole     REACTION: unspecified  . Sulfonamide Derivatives     REACTION: unspecified  . Celecoxib Rash    REACTION: [place] REACTION: [place]  . Codeine Rash    REACTION: [place] REACTION: [place]  . Hydrocod Polst-Cpm Polst Er Rash  . Propoxyphene Rash  . Sulfa Antibiotics Rash    REACTION: unspecified REACTION: unspecified    Current Outpatient Medications on File Prior to Visit  Medication Sig Dispense Refill  . aspirin (ASPIRIN LOW DOSE) 81 MG tablet Take 81 mg by mouth daily.      . blood glucose meter kit and supplies Dispense based on patient and insurance preference. Use up to times times daily as directed. (FOR ICD-10 E10.9, E11.9). 1 each 0  . brimonidine-timolol (COMBIGAN) 0.2-0.5 % ophthalmic solution Place 1 drop into both eyes every 12 (twelve) hours.    . Cholecalciferol (VITAMIN D-3) 1000 UNITS CAPS Take 1 capsule by mouth daily.    Marland Kitchen ezetimibe  (ZETIA) 10 MG tablet TAKE ONE TABLET BY MOUTH DAILY 90 tablet 2  . fexofenadine (ALLEGRA) 180 MG tablet Take 1 tablet (180 mg total) by mouth as needed for allergies or rhinitis. 90 tablet 0  .  fluticasone (FLONASE) 50 MCG/ACT nasal spray Place 2 sprays into both nostrils as needed for allergies or rhinitis.    Marland Kitchen glipiZIDE (GLUCOTROL XL) 10 MG 24 hr tablet TAKE ONE TABLET BY MOUTH EVERY MORNING WITH BREAKFAST 90 tablet 0  . glucose blood (ONE TOUCH ULTRA TEST) test strip USE UP TO 4 TIMES DAILY TO CHECK BLOOD SUGAR AS DIRECTED 100 each 2  . isosorbide mononitrate (IMDUR) 60 MG 24 hr tablet Take 1 tablet (60 mg total) by mouth daily. 90 tablet 3  . losartan (COZAAR) 50 MG tablet TAKE ONE TABLET BY MOUTH DAILY 90 tablet 2  . metoprolol succinate (TOPROL-XL) 100 MG 24 hr tablet TAKE ONE TABLET BY MOUTH EVERY MORNING WITH OR IMMEDIATELY FOLLOWING A MEAL 90 tablet 3  . nitroGLYCERIN (NITROSTAT) 0.4 MG SL tablet Place 1 tablet (0.4 mg total) under the tongue every 5 (five) minutes as needed for chest pain. 26 tablet 0  . OVER THE COUNTER MEDICATION Take 1 tablet by mouth daily. Med name: VISION FORMULA    . pantoprazole (PROTONIX) 40 MG tablet TAKE ONE TABLET BY MOUTH DAILY 90 tablet 1  . sertraline (ZOLOFT) 25 MG tablet Take 1 tablet (25 mg total) by mouth daily. For stress. 90 tablet 3  . timolol (TIMOPTIC) 0.5 % ophthalmic solution     . trimethoprim (TRIMPEX) 100 MG tablet Take 100 mg by mouth 2 (two) times daily as needed (UTI).     . vitamin B-12 (CYANOCOBALAMIN) 1000 MCG tablet Take 1,000 mcg by mouth daily.    . cyclobenzaprine (FLEXERIL) 5 MG tablet Take 1 tablet (5 mg total) by mouth 3 (three) times daily as needed for muscle spasms. (Patient not taking: Reported on 04/24/2019) 30 tablet 0  . [DISCONTINUED] loratadine (CLARITIN) 10 MG tablet Take 10 mg by mouth as needed.      No current facility-administered medications on file prior to visit.     BP 120/82   Pulse 71   Temp 98.2 F (36.8  C) (Oral)   Ht _0  (1.727 m)   Wt 169 lb (76.7 kg)   SpO2 97%   BMI 25.70 kg/m    Objective:   Physical Exam  Constitutional: She is oriented to person, place, and time. She appears well-nourished.  Neck: No spinous process tenderness and no muscular tenderness present. Decreased range of motion present. No erythema present.    Pain with neck extension and right lateral rotation. Slight decrease in ROM due to pain with these movements.   Respiratory: Effort normal.  Musculoskeletal:     Right shoulder: She exhibits decreased range of motion and pain. She exhibits no tenderness and normal strength.     Cervical back: She exhibits decreased range of motion and pain. She exhibits no tenderness.     Comments: Decrease in ROM to right shoulder with lateral and forward abduction. 5/5 strength to bilateral upper extremities.   Neurological: She is alert and oriented to person, place, and time.  Psychiatric: She has a normal mood and affect.           Assessment & Plan:

## 2019-04-24 NOTE — Patient Instructions (Addendum)
Reschedule your wellness visit with our nurse and your physical with me for July 2020.  You may take up to 3000 mg of Tylenol in 24 hours.  Schedule a visit with Dr. [name] for your shoulder and neck pain.   It was a pleasure to see you today!

## 2019-04-27 ENCOUNTER — Ambulatory Visit (INDEPENDENT_AMBULATORY_CARE_PROVIDER_SITE_OTHER): Payer: Medicare Other

## 2019-04-27 DIAGNOSIS — Z0000 Encounter for general adult medical examination without abnormal findings: Secondary | ICD-10-CM

## 2019-04-27 NOTE — Patient Instructions (Signed)
[name] [name] , Thank you for taking time to come for your Medicare Wellness Visit. I appreciate your ongoing commitment to your health goals. Please review the following plan we discussed and let me know if I can assist you in the future.   These are the goals we discussed: Goals    . Patient Stated     Starting 04/27/19, I will continue to take medications as prescribed.        This is a list of the screening recommended for you and due dates:  Health Maintenance  Topic Date Due  . Tetanus Vaccine  05/29/2019*  . Complete foot exam   05/03/2019  . Flu Shot  06/30/2019  . Hemoglobin A1C  08/03/2019  . Eye exam for diabetics  11/02/2019  . Mammogram  04/17/2020  . DEXA scan (bone density measurement)  Completed  . Pneumonia vaccines  Completed  *Topic was postponed. The date shown is not the original due date.   Preventive Care for Adults  A healthy lifestyle and preventive care can promote health and wellness. Preventive health guidelines for adults include the following key practices.  . A routine yearly physical is a good way to check with your health care provider about your health and preventive screening. It is a chance to share any concerns and updates on your health and to receive a thorough exam.  . Visit your dentist for a routine exam and preventive care every 6 months. Brush your teeth twice a day and floss once a day. Good oral hygiene prevents tooth decay and gum disease.  . The frequency of eye exams is based on your age, health, family medical history, use  of contact lenses, and other factors. Follow your health care provider's recommendations for frequency of eye exams.  . Eat a healthy diet. Foods like vegetables, fruits, whole grains, low-fat dairy products, and lean protein foods contain the nutrients you need without too many calories. Decrease your intake of foods high in solid fats, added sugars, and salt. Eat the right amount of calories for you. Get  information about a proper diet from your health care provider, if necessary.  . Regular physical exercise is one of the most important things you can do for your health. Most adults should get at least 150 minutes of moderate-intensity exercise (any activity that increases your heart rate and causes you to sweat) each week. In addition, most adults need muscle-strengthening exercises on 2 or more days a week.  Silver Sneakers may be a benefit available to you. To determine eligibility, you may visit the website: www.silversneakers.com or contact program at [phone] Mon-Fri between 8AM-8PM.   . Maintain a healthy weight. The body mass index (BMI) is a screening tool to identify possible weight problems. It provides an estimate of body fat based on height and weight. Your health care provider can find your BMI and can help you achieve or maintain a healthy weight.   For adults 20 years and older: ? A BMI below 18.5 is considered underweight. ? A BMI of 18.5 to 24.9 is normal. ? A BMI of 25 to 29.9 is considered overweight. ? A BMI of 30 and above is considered obese.   . Maintain normal blood lipids and cholesterol levels by exercising and minimizing your intake of saturated fat. Eat a balanced diet with plenty of fruit and vegetables. Blood tests for lipids and cholesterol should begin at age 53 and be repeated every 5 years. If your lipid or cholesterol  levels are high, you are over 50, or you are at high risk for heart disease, you may need your cholesterol levels checked more frequently. Ongoing high lipid and cholesterol levels should be treated with medicines if diet and exercise are not working.  . If you smoke, find out from your health care provider how to quit. If you do not use tobacco, please do not start.  . If you choose to drink alcohol, please do not consume more than 2 drinks per day. One drink is considered to be 12 ounces (355 mL) of beer, 5 ounces (148 mL) of wine, or 1.5  ounces (44 mL) of liquor.  . If you are 62-50 years old, ask your health care provider if you should take aspirin to prevent strokes.  . Use sunscreen. Apply sunscreen liberally and repeatedly throughout the day. You should seek shade when your shadow is shorter than you. Protect yourself by wearing long sleeves, pants, a wide-brimmed hat, and sunglasses year round, whenever you are outdoors.  . Once a month, do a whole body skin exam, using a mirror to look at the skin on your back. Tell your health care provider of new moles, moles that have irregular borders, moles that are larger than a pencil eraser, or moles that have changed in shape or color.

## 2019-04-27 NOTE — Progress Notes (Signed)
Subjective:   [name] [name] is a 85 y.o. female who presents for Medicare Annual (Subsequent) preventive examination.  Review of Systems:  N/A Cardiac Risk Factors include: advanced age (>43mn, >>42women);diabetes mellitus     Objective:     Vitals: There were no vitals taken for this visit.  There is no height or weight on file to calculate BMI.  Advanced Directives 04/27/2019 04/27/2017 03/22/2016 03/22/2014  Does Patient Have a Medical Advance Directive? No No No Patient does not have advance directive;Patient would not like information  Would patient like information on creating a medical advance directive? No - Patient declined - Yes - EScientist, clinical (histocompatibility and immunogenetics)given -    Tobacco Social History   Tobacco Use  Smoking Status Never Smoker  Smokeless Tobacco Never Used     Counseling given: No   Clinical Intake:  Pre-visit preparation completed: Yes  Pain Score: 8 (neck, bilateral shoulders)     Nutritional Status: BMI 25 -29 Overweight Nutritional Risks: None  How often do you need to have someone help you when you read instructions, pamphlets, or other written materials from your doctor or pharmacy?: 1 - Never What is the last grade level you completed in school?: 12th grade  Interpreter Needed?: No  Comments: pt is married Information entered by :: [employer] LPN  Past Medical History:  Diagnosis Date  . Allergy    Cipro, Plavix, Statins  . CAD (coronary artery disease)    a. s/p tandem Promus DES to LAD in 2009 by Dr. BOlevia Perches  b.  LHC (03/22/14):  prox LAD 30%, mid LAD 40%, LAD stent ok with dist 20% ISR, apical LAD occluded with L-L collats filling apical vessel (too small for PCI), mid RCA 20%, EF 70%.  Med Rx  . Diabetes mellitus    Diagnosed 2012  . Diverticulosis of colon (without mention of hemorrhage)   . GERD (gastroesophageal reflux disease)   . Hx of cardiovascular stress test    a. Nuclear (08/2013):  No ischemia, EF 80%, Normal  . Hx of  echocardiogram    a. Echo (07/2013):  Mild LVH, vigorous LVF, EF 65-70%, Gr 1 DD, mild MR, mild to mod LAE  . Hyperlipidemia    intol of statins  . Hypertension   . NHL (non-Hodgkin's lymphoma) ([place] dx'd 2003   chemo/xrt comp 2003  . Personal history of colonic polyps   . Proctitis   . Urticaria    Past Surgical History:  Procedure Laterality Date  . cardiac cath-neg    . CORONARY ANGIOPLASTY WITH STENT PLACEMENT     x 2  . dexa-neg    . KNEE SURGERY  10/2003   left  . laser surgery for cataracts-left    . LEFT HEART CATHETERIZATION WITH CORONARY ANGIOGRAM N/A 03/22/2014   Procedure: LEFT HEART CATHETERIZATION WITH CORONARY ANGIOGRAM;  Surgeon: CBurnell Blanks MD;  [facility];  Service: Cardiovascular;  Laterality: N/A;  . stress cardiolite    . VAGINAL HYSTERECTOMY     partial, fibroids, one ovary left   Family History  Problem Relation Age of Onset  . Cancer Mother        jaw  . Hypertension Father   . Heart attack Father   . Heart attack Sister   . Colon cancer Neg Hx    Social History   Socioeconomic History  . Marital status: Married    Spouse name: Not on file  . Number of children: Not on file  .  Years of education: Not on file  . Highest education level: Not on file  Occupational History  . Occupation: Retired    Fish farm manager: RETIRED  Social Needs  . Financial resource strain: Not on file  . Food insecurity:    Worry: Not on file    Inability: Not on file  . Transportation needs:    Medical: Not on file    Non-medical: Not on file  Tobacco Use  . Smoking status: Never Smoker  . Smokeless tobacco: Never Used  Substance and Sexual Activity  . Alcohol use: No  . Drug use: No  . Sexual activity: Not Currently  Lifestyle  . Physical activity:    Days per week: Not on file    Minutes per session: Not on file  . Stress: Not on file  Relationships  . Social connections:    Talks on phone: Not on file    Gets together: Not on file     Attends religious service: Not on file    Active member of club or organization: Not on file    Attends meetings of clubs or organizations: Not on file    Relationship status: Not on file  Other Topics Concern  . Not on file  Social History Narrative  . Not on file    Outpatient Encounter Medications as of 04/27/2019  Medication Sig  . aspirin (ASPIRIN LOW DOSE) 81 MG tablet Take 81 mg by mouth daily.    . blood glucose meter kit and supplies Dispense based on patient and insurance preference. Use up to times times daily as directed. (FOR ICD-10 E10.9, E11.9).  . brimonidine-timolol (COMBIGAN) 0.2-0.5 % ophthalmic solution Place 1 drop into both eyes every 12 (twelve) hours.  . Cholecalciferol (VITAMIN D-3) 1000 UNITS CAPS Take 1 capsule by mouth daily.  . cyclobenzaprine (FLEXERIL) 5 MG tablet Take 1 tablet (5 mg total) by mouth 3 (three) times daily as needed for muscle spasms.  Marland Kitchen ezetimibe (ZETIA) 10 MG tablet TAKE ONE TABLET BY MOUTH DAILY  . fexofenadine (ALLEGRA) 180 MG tablet Take 1 tablet (180 mg total) by mouth as needed for allergies or rhinitis.  . fluticasone (FLONASE) 50 MCG/ACT nasal spray Place 2 sprays into both nostrils as needed for allergies or rhinitis.  Marland Kitchen glipiZIDE (GLUCOTROL XL) 10 MG 24 hr tablet TAKE ONE TABLET BY MOUTH EVERY MORNING WITH BREAKFAST  . glucose blood (ONE TOUCH ULTRA TEST) test strip USE UP TO 4 TIMES DAILY TO CHECK BLOOD SUGAR AS DIRECTED  . isosorbide mononitrate (IMDUR) 60 MG 24 hr tablet Take 1 tablet (60 mg total) by mouth daily.  Marland Kitchen losartan (COZAAR) 50 MG tablet TAKE ONE TABLET BY MOUTH DAILY  . metoprolol succinate (TOPROL-XL) 100 MG 24 hr tablet TAKE ONE TABLET BY MOUTH EVERY MORNING WITH OR IMMEDIATELY FOLLOWING A MEAL  . nitroGLYCERIN (NITROSTAT) 0.4 MG SL tablet Place 1 tablet (0.4 mg total) under the tongue every 5 (five) minutes as needed for chest pain.  Marland Kitchen OVER THE COUNTER MEDICATION Take 1 tablet by mouth daily. Med name: VISION FORMULA   . pantoprazole (PROTONIX) 40 MG tablet TAKE ONE TABLET BY MOUTH DAILY  . sertraline (ZOLOFT) 25 MG tablet Take 1 tablet (25 mg total) by mouth daily. For stress.  . timolol (TIMOPTIC) 0.5 % ophthalmic solution   . trimethoprim (TRIMPEX) 100 MG tablet Take 100 mg by mouth 2 (two) times daily as needed (UTI).   . vitamin B-12 (CYANOCOBALAMIN) 1000 MCG tablet Take 1,000 mcg by mouth  daily.  . [DISCONTINUED] loratadine (CLARITIN) 10 MG tablet Take 10 mg by mouth as needed.    No facility-administered encounter medications on file as of 04/27/2019.     Activities of Daily Living In your present state of health, do you have any difficulty performing the following activities: 04/27/2019  Hearing? Y  Vision? N  Difficulty concentrating or making decisions? N  Walking or climbing stairs? N  Dressing or bathing? N  Doing errands, shopping? N  Preparing Food and eating ? N  Using the Toilet? N  In the past six months, have you accidently leaked urine? N  Do you have problems with loss of bowel control? N  Managing your Medications? N  Managing your Finances? N  Housekeeping or managing your Housekeeping? N  Some recent data might be hidden    Patient Care Team: Pleas Koch, NP as PCP - General (Internal Medicine) Burnell Blanks, MD as PCP - Cardiology (Cardiology) Rana Snare, MD as Consulting Physician (Urology) Ladene Artist, MD as Consulting Physician (Gastroenterology) Thelma Comp, OD as Consulting Physician (Optometry)    Assessment:   This is a routine wellness examination for [facility].  Vision Screening Comments: Vision exam in May 2020 with Dr. Maryruth Hancock B.   Exercise Activities and Dietary recommendations Current Exercise Habits: The patient does not participate in regular exercise at present, Exercise limited by: orthopedic condition(s)  Goals    . Patient Stated     Starting 04/27/19, I will continue to take medications as prescribed.        Fall  Risk Fall Risk  04/27/2019 08/02/2018 04/27/2017 03/22/2016  Falls in the past year? 1 No No No  Comment fell in garage - - -  Number falls in past yr: 0 - - -  Injury with Fall? 1 - - -   Depression Screen PHQ 2/9 Scores 04/27/2019 04/27/2019 08/02/2018 04/27/2017  PHQ - 2 Score 0 0 0 0  PHQ- 9 Score 0 0 - -     Cognitive Function MMSE - Mini Mental State Exam 04/27/2019 04/27/2017 03/22/2016  Orientation to time 5 5 5   Orientation to Place 5 5 5   Registration 3 3 3   Attention/ Calculation 0 0 0  Recall 3 3 3   Language- name 2 objects 0 0 0  Language- repeat 1 1 1   Language- follow 3 step command 0 3 3  Language- read & follow direction 0 0 0  Write a sentence 0 0 0  Copy design 0 0 0  Total score 17 20 20        PLEASE NOTE: A Mini-Cog screen was completed. Maximum score is 17. A value of 0 denotes this part of Folstein MMSE was not completed or the patient failed this part of the Mini-Cog screening.   Mini-Cog Screening Orientation to Time - Max 5 pts Orientation to Place - Max 5 pts Registration - Max 3 pts Recall - Max 3 pts Language Repeat - Max 1 pts    Immunization History  Administered Date(s) Administered  . Influenza Split 09/14/2011, 08/28/2012  . Influenza Whole 09/09/2004, 09/05/2006, 08/31/2007, 08/29/2008, 09/18/2009, 09/01/2010  . Influenza,inj,Quad PF,6+ Mos 08/26/2014, 08/06/2015, 09/14/2016, 09/08/2017, 08/02/2018  . Pneumococcal Conjugate-13 08/26/2014  . Pneumococcal Polysaccharide-23 09/09/2004  . Td 05/30/1999  . Zoster 04/15/2010   Screening Tests Health Maintenance  Topic Date Due  . TETANUS/TDAP  05/29/2019 (Originally 05/29/2009)  . FOOT EXAM  05/03/2019  . INFLUENZA VACCINE  06/30/2019  . HEMOGLOBIN A1C  08/03/2019  .  OPHTHALMOLOGY EXAM  11/02/2019  . MAMMOGRAM  04/17/2020  . DEXA SCAN  Completed  . PNA vac Low Risk Adult  Completed    Plan:      I have personally reviewed, addressed, and noted the following in the patient's chart:  A.  Medical and social history B. Use of alcohol, tobacco or illicit drugs  C. Current medications and supplements D. Functional ability and status E.  Nutritional status F.  Physical activity G. Advance directives H. List of other physicians I.  Hospitalizations, surgeries, and ER visits in previous 12 months J.  Vitals (unless it is a telemedicine encounter) K. Screenings to include cognitive, depression, hearing, vision (NOTE: hearing and vision screenings not completed in telemedicine encounter) L. Referrals and appointments   In addition, I have reviewed and discussed with patient certain preventive protocols, quality metrics, and best practice recommendations. A written personalized care plan for preventive services and recommendations were provided to patient.  With patient's permission, we connected on 04/27/19 at  1:00 PM EDT. Interactive audio and video telecommunications were attempted with patient. This attempt was unsuccessful due to patient having technical difficulties OR patient did not have access to video capability.  Encounter was completed with audio only.  Two patient identifiers were used to ensure the encounter occurred with the correct person.   Patient was in home and writer was in office.    Signed,   Lindell Noe, MHA, BS, LPN Health Coach

## 2019-04-27 NOTE — Progress Notes (Signed)
PCP notes:   Health maintenance:  No gaps identified.  Abnormal screenings:   Fall risk - hx of single fall Fall Risk  04/27/2019 08/02/2018 04/27/2017 03/22/2016  Falls in the past year? 1 No No No  Comment fell in garage - - -  Number falls in past yr: 0 - - -  Injury with Fall? 1 - - -   Patient concerns:   None  Nurse concerns:  None  Next PCP appt:   06/05/19 @ 1400

## 2019-04-30 ENCOUNTER — Encounter: Payer: Self-pay | Admitting: Family Medicine

## 2019-04-30 ENCOUNTER — Ambulatory Visit: Payer: Medicare Other | Admitting: Family Medicine

## 2019-04-30 ENCOUNTER — Other Ambulatory Visit: Payer: Self-pay

## 2019-04-30 VITALS — BP 120/70 | HR 63 | Temp 98.4°F | Ht 68.0 in | Wt 170.5 lb

## 2019-04-30 DIAGNOSIS — G8911 Acute pain due to trauma: Secondary | ICD-10-CM

## 2019-04-30 DIAGNOSIS — M25512 Pain in left shoulder: Secondary | ICD-10-CM | POA: Diagnosis not present

## 2019-04-30 NOTE — Progress Notes (Signed)
I reviewed health advisor's note, was available for consultation, and agree with documentation and plan.  

## 2019-04-30 NOTE — Progress Notes (Signed)
[name] Verno [name], MD Primary Care and Sports Medicine [facility] at [facility] [place], [address] Phone: [phone]  FAX: [phone]  [name] [name] - 85 y.o. female  MRN [id number]  Date of Birth: [birth year]  Visit Date: 04/30/2019  PCP: Pleas Koch, NP  Referred by: Pleas Koch, NP  Chief Complaint  Patient presents with  . Fall    in Feb  . Shoulder Pain    RIght  . Neck Pain   Subjective:   [name] [name] is a 85 y.o. very pleasant female patient who presents with the following:  She is a very nice lady who I have known quite well over the years, and she fell on her right shoulder in February.  Since then she has had some neck pain as well as shoulder pain.  Initially she has some difficulty abducting her shoulder, but she has been working on her range of motion at home.  She strive to limit her outside exposure secondary COVID-19, which she wanted to get checked today to make sure there is nothing more serious wrong with her shoulder.  Her strength is been relatively preserved, but she does have some pain in her trap and she does have some pain with abduction.  Missed a bottom step in Feb, landed on her right side.  Has been hurting her for quite a while. Lost two family members.    Shoulder and trap on the right side.  No numbness or tingling.  Sore and it will hurt.  Raising arm up.     Past Medical History, Surgical History, Social History, Family History, Problem List, Medications, and Allergies have been reviewed and updated if relevant.  Patient Active Problem List   Diagnosis Date Noted  . Shoulder pain 03/13/2019  . Tingling 06/29/2018  . Recurrent UTI 01/12/2018  . Neck pain 09/27/2017  . Insomnia 09/27/2017  . Stress due to illness of family member 05/04/2016  . Pain in joints of both feet 01/14/2016  . Osteopenia 08/06/2015  . Constipation 12/30/2014  . B12 deficiency 12/30/2014  .  Allergic reaction 09/02/2014  . Arthralgia of right foot 07/18/2014  . Pruritic intertrigo 07/18/2014  . Right hip pain 06/04/2014  . Other malaise and fatigue 06/04/2014  . Elevated LFTs 04/19/2014  . Mitral regurgitation 04/19/2014  . Pes planus 06/13/2013  . Renal insufficiency 06/07/2011  . Type 2 diabetes mellitus ([place]) 05/31/2011  . MIXED HYPERLIPIDEMIA 08/04/2010  . LUMBAR SPRAIN AND STRAIN 01/15/2010  . 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 02/25/2009  . EXTERNAL HEMORRHOIDS 02/24/2009  . DIVERTICULOSIS, COLON 02/24/2009  . LYMPHOMA, MALT 08/28/2007  . DETACHED RETINA 08/28/2007  . Essential hypertension 08/28/2007  . Coronary atherosclerosis 08/28/2007  . MITRAL VALVE PROLAPSE 08/28/2007  . GERD 08/28/2007  . IBS 08/28/2007  . BREAST CYSTS, BILATERAL 08/28/2007    Past Medical History:  Diagnosis Date  . Allergy    Cipro, Plavix, Statins  . CAD (coronary artery disease)    a. s/p tandem Promus DES to LAD in 2009 by Dr. Olevia Perches;  b.  LHC (03/22/14):  prox LAD 30%, mid LAD 40%, LAD stent ok with dist 20% ISR, apical LAD occluded with L-L collats filling apical vessel (too small for PCI), mid RCA 20%, EF 70%.  Med Rx  . Diabetes mellitus    Diagnosed 2012  . Diverticulosis of colon (without mention of hemorrhage)   . GERD (gastroesophageal reflux disease)   .  Hx of cardiovascular stress test    a. Nuclear (08/2013):  No ischemia, EF 80%, Normal  . Hx of echocardiogram    a. Echo (07/2013):  Mild LVH, vigorous LVF, EF 65-70%, Gr 1 DD, mild MR, mild to mod LAE  . Hyperlipidemia    intol of statins  . Hypertension   . NHL (non-Hodgkin's lymphoma) ([place]) dx'd 2003   chemo/xrt comp 2003  . Personal history of colonic polyps   . Proctitis   . Urticaria     Past Surgical History:  Procedure Laterality Date  . cardiac cath-neg    . CORONARY ANGIOPLASTY WITH STENT PLACEMENT     x 2  . dexa-neg    . KNEE SURGERY  10/2003   left  . laser surgery for  cataracts-left    . LEFT HEART CATHETERIZATION WITH CORONARY ANGIOGRAM N/A 03/22/2014   Procedure: LEFT HEART CATHETERIZATION WITH CORONARY ANGIOGRAM;  Surgeon: Burnell Blanks, MD;  Location[facility] C[facility] LAB;  Service: Cardiovascular;  Laterality: N/A;  . stress cardiolite    . VAGINAL HYSTERECTOMY     partial, fibroids, one ovary left    Social History   Socioeconomic History  . Marital status: Married    Spouse name: Not on file  . Number of children: Not on file  . Years of education: Not on file  . Highest education level: Not on file  Occupational History  . Occupation: Retired    Fish farm manager: RETIRED  Social Needs  . Financial resource strain: Not on file  . Food insecurity:    Worry: Not on file    Inability: Not on file  . Transportation needs:    Medical: Not on file    Non-medical: Not on file  Tobacco Use  . Smoking status: Never Smoker  . Smokeless tobacco: Never Used  Substance and Sexual Activity  . Alcohol use: No  . Drug use: No  . Sexual activity: Not Currently  Lifestyle  . Physical activity:    Days per week: Not on file    Minutes per session: Not on file  . Stress: Not on file  Relationships  . Social connections:    Talks on phone: Not on file    Gets together: Not on file    Attends religious service: Not on file    Active member of club or organization: Not on file    Attends meetings of clubs or organizations: Not on file    Relationship status: Not on file  . Intimate partner violence:    Fear of current or ex partner: Not on file    Emotionally abused: Not on file    Physically abused: Not on file    Forced sexual activity: Not on file  Other Topics Concern  . Not on file  Social History Narrative  . Not on file    Family History  Problem Relation Age of Onset  . Cancer Mother        jaw  . Hypertension Father   . Heart attack Father   . Heart attack Sister   . Colon cancer Neg Hx     Allergies  Allergen Reactions  .  Other Rash and Anaphylaxis  . Shellfish Allergy Anaphylaxis and Rash  . Cephalexin     REACTION: trash and swelling REACTION: trash and swelling  . Ciprofloxacin     REACTION: u/k  . Clopidogrel Bisulfate     REACTION: swelling, rash  . Codeine Phosphate     REACTION:  unspecified  . Ezetimibe-Simvastatin     REACTION: myalgias REACTION: myalgias  . Hydrocod Polst-Cpm Polst Er     REACTION: u/k  . Lidocaine Hives    ONLY TO ADHESIVE LIDOCAINE PATCHES. NO PROBLEM WITH INJECTABLE LIDOCAINE.  Marland Kitchen Nitrofurantoin     REACTION: [place] REACTION: [place]  . Pregabalin     REACTION: felt bad REACTION: felt bad  . Propoxyphene N-Acetaminophen     REACTION: [place]  . Rofecoxib     unk unk   . Sulfamethoxazole     REACTION: unspecified  . Sulfonamide Derivatives     REACTION: unspecified  . Celecoxib Rash    REACTION: [place] REACTION: [place]  . Codeine Rash    REACTION: [place] REACTION: [place]  . Hydrocod Polst-Cpm Polst Er Rash  . Propoxyphene Rash  . Sulfa Antibiotics Rash    REACTION: unspecified REACTION: unspecified    Medication list reviewed and updated in full in [place].  GEN: no acute illness or fever CV: No chest pain or shortness of breath MSK: detailed above Neuro: neurological signs are described above ROS O/w per HPI  Objective:   BP 120/70   Pulse 63   Temp 98.4 F (36.9 C) (Oral)   Ht 5' 8"  (1.727 m)   Wt 170 lb 8 oz (77.3 kg)   BMI 25.92 kg/m    GEN: Well-developed,well-nourished,in no acute distress; alert,appropriate and cooperative throughout examination HEENT: Normocephalic and atraumatic without obvious abnormalities. Ears, externally no deformities PULM: Breathing comfortably in no respiratory distress EXT: No clubbing, cyanosis, or edema PSYCH: Normally interactive. Cooperative during the interview. Pleasant. Friendly and conversant. Not anxious or depressed appearing. Normal, full affect.  Shoulder: R Inspection: No muscle wasting or winging  Ecchymosis/edema: neg  AC joint, scapula, clavicle: NT Cervical spine: NT, full ROM Spurling's: neg Abduction: full, 4+/5 Flexion: full, 5/5 IR, full, lift-off: 5/5 ER at neutral: full, 5/5 AC crossover: neg Neer: neg Hawkins: neg Drop Test: neg Empty Can: pos (mild Jobe pos) Supraspinatus insertion: nt Bicipital groove: NT Speed's: neg Yergason's: neg Sulcus sign: neg Scapular dyskinesis: none C5-T1 intact  Neuro: Sensation intact Grip 5/5   Radiology: No results found.  Assessment and Plan:   Acute shoulder pain due to trauma, left  Distant trauma in February.  Partially recovered from almost certainly a partial rotator cuff tear.  Her strength and range of motion are virtually preserved at this point.  Minimal to no concern for a surgical problem.  Offered a few options including physical therapy, which I think would be beneficial, and potentially shoulder injection for pain management, but at this point she wants to hold off and hold off on PT due to her husband's health conditions.  Reassurance, A rehabilitation program from the [employer] was reviewed with the patient face to face for their condition.   I appreciate the opportunity to evaluate this very friendly patient. If you have any question regarding her care or prognosis, do not hesitate to ask.   Follow-up: prn  Signed,  Raquell Richer T. Alberta Lenhard, MD   Outpatient Encounter Medications as of 04/30/2019  Medication Sig  . aspirin (ASPIRIN LOW DOSE) 81 MG tablet Take 81 mg by mouth daily.    . blood glucose meter kit and supplies Dispense based on patient and insurance preference. Use up to times times daily as directed. (FOR ICD-10 E10.9, E11.9).  . brimonidine-timolol (COMBIGAN) 0.2-0.5 % ophthalmic solution Place 1 drop into both eyes every 12 (twelve) hours.  Marland Kitchen  Cholecalciferol (VITAMIN D-3) 1000 UNITS CAPS Take 1 capsule by mouth daily.  . cyclobenzaprine (FLEXERIL) 5 MG tablet  Take 1 tablet (5 mg total) by mouth 3 (three) times daily as needed for muscle spasms.  Marland Kitchen ezetimibe (ZETIA) 10 MG tablet TAKE ONE TABLET BY MOUTH DAILY  . fexofenadine (ALLEGRA) 180 MG tablet Take 1 tablet (180 mg total) by mouth as needed for allergies or rhinitis.  . fluticasone (FLONASE) 50 MCG/ACT nasal spray Place 2 sprays into both nostrils as needed for allergies or rhinitis.  Marland Kitchen glipiZIDE (GLUCOTROL XL) 10 MG 24 hr tablet TAKE ONE TABLET BY MOUTH EVERY MORNING WITH BREAKFAST  . glucose blood (ONE TOUCH ULTRA TEST) test strip USE UP TO 4 TIMES DAILY TO CHECK BLOOD SUGAR AS DIRECTED  . isosorbide mononitrate (IMDUR) 60 MG 24 hr tablet Take 1 tablet (60 mg total) by mouth daily.  Marland Kitchen losartan (COZAAR) 50 MG tablet TAKE ONE TABLET BY MOUTH DAILY  . metoprolol succinate (TOPROL-XL) 100 MG 24 hr tablet TAKE ONE TABLET BY MOUTH EVERY MORNING WITH OR IMMEDIATELY FOLLOWING A MEAL  . nitroGLYCERIN (NITROSTAT) 0.4 MG SL tablet Place 1 tablet (0.4 mg total) under the tongue every 5 (five) minutes as needed for chest pain.  Marland Kitchen OVER THE COUNTER MEDICATION Take 1 tablet by mouth daily. Med name: VISION FORMULA  . pantoprazole (PROTONIX) 40 MG tablet TAKE ONE TABLET BY MOUTH DAILY  . sertraline (ZOLOFT) 25 MG tablet Take 1 tablet (25 mg total) by mouth daily. For stress.  . timolol (TIMOPTIC) 0.5 % ophthalmic solution   . trimethoprim (TRIMPEX) 100 MG tablet Take 100 mg by mouth 2 (two) times daily as needed (UTI).   . vitamin B-12 (CYANOCOBALAMIN) 1000 MCG tablet Take 1,000 mcg by mouth daily.  . [DISCONTINUED] loratadine (CLARITIN) 10 MG tablet Take 10 mg by mouth as needed.    No facility-administered encounter medications on file as of 04/30/2019.

## 2019-05-08 ENCOUNTER — Other Ambulatory Visit: Payer: Self-pay | Admitting: Primary Care

## 2019-05-08 DIAGNOSIS — E0821 Diabetes mellitus due to underlying condition with diabetic nephropathy: Secondary | ICD-10-CM

## 2019-05-23 ENCOUNTER — Other Ambulatory Visit: Payer: Self-pay | Admitting: Primary Care

## 2019-05-23 DIAGNOSIS — E119 Type 2 diabetes mellitus without complications: Secondary | ICD-10-CM

## 2019-05-23 DIAGNOSIS — E782 Mixed hyperlipidemia: Secondary | ICD-10-CM

## 2019-05-23 DIAGNOSIS — I10 Essential (primary) hypertension: Secondary | ICD-10-CM

## 2019-05-23 DIAGNOSIS — E538 Deficiency of other specified B group vitamins: Secondary | ICD-10-CM

## 2019-05-29 ENCOUNTER — Other Ambulatory Visit (INDEPENDENT_AMBULATORY_CARE_PROVIDER_SITE_OTHER): Payer: Medicare Other

## 2019-05-29 ENCOUNTER — Other Ambulatory Visit: Payer: Self-pay

## 2019-05-29 DIAGNOSIS — I10 Essential (primary) hypertension: Secondary | ICD-10-CM

## 2019-05-29 DIAGNOSIS — E782 Mixed hyperlipidemia: Secondary | ICD-10-CM

## 2019-05-29 DIAGNOSIS — E119 Type 2 diabetes mellitus without complications: Secondary | ICD-10-CM | POA: Diagnosis not present

## 2019-05-29 DIAGNOSIS — E538 Deficiency of other specified B group vitamins: Secondary | ICD-10-CM | POA: Diagnosis not present

## 2019-05-29 LAB — COMPREHENSIVE METABOLIC PANEL
ALT: 27 U/L (ref 0–35)
AST: 26 U/L (ref 0–37)
Albumin: 4.4 g/dL (ref 3.5–5.2)
Alkaline Phosphatase: 57 U/L (ref 39–117)
BUN: 14 mg/dL (ref 6–23)
CO2: 30 mEq/L (ref 19–32)
Calcium: 9.3 mg/dL (ref 8.4–10.5)
Chloride: 104 mEq/L (ref 96–112)
Creatinine, Ser: 1.16 mg/dL (ref 0.40–1.20)
GFR: 44.37 mL/min — ABNORMAL LOW (ref >60.00)
Glucose, Bld: 136 mg/dL — ABNORMAL HIGH (ref 70–99)
Potassium: 5.3 mEq/L — ABNORMAL HIGH (ref 3.5–5.1)
Sodium: 142 mEq/L (ref 135–145)
Total Bilirubin: 0.6 mg/dL (ref 0.2–1.2)
Total Protein: 7.3 g/dL (ref 6.0–8.3)

## 2019-05-29 LAB — LIPID PANEL
Cholesterol: 165 mg/dL (ref 0–200)
HDL: 44.5 mg/dL (ref >39.00)
LDL Cholesterol: 84 mg/dL (ref 0–99)
NonHDL: 120.05
Total CHOL/HDL Ratio: 4
Triglycerides: 181 mg/dL — ABNORMAL HIGH (ref 0.0–149.0)
VLDL: 36.2 mg/dL (ref 0.0–40.0)

## 2019-05-29 LAB — VITAMIN B12: Vitamin B-12: 1292 pg/mL — ABNORMAL HIGH (ref 211–911)

## 2019-05-29 LAB — HEMOGLOBIN A1C: Hgb A1c MFr Bld: 7.5 % — ABNORMAL HIGH (ref 4.6–6.5)

## 2019-05-31 ENCOUNTER — Other Ambulatory Visit: Payer: Self-pay | Admitting: Primary Care

## 2019-05-31 DIAGNOSIS — E876 Hypokalemia: Secondary | ICD-10-CM

## 2019-06-04 ENCOUNTER — Other Ambulatory Visit (INDEPENDENT_AMBULATORY_CARE_PROVIDER_SITE_OTHER): Payer: Medicare Other

## 2019-06-04 DIAGNOSIS — E876 Hypokalemia: Secondary | ICD-10-CM | POA: Diagnosis not present

## 2019-06-04 LAB — POTASSIUM: Potassium: 5.2 mEq/L — ABNORMAL HIGH (ref 3.5–5.1)

## 2019-06-05 ENCOUNTER — Ambulatory Visit (INDEPENDENT_AMBULATORY_CARE_PROVIDER_SITE_OTHER): Payer: Medicare Other | Admitting: Primary Care

## 2019-06-05 ENCOUNTER — Other Ambulatory Visit: Payer: Self-pay

## 2019-06-05 ENCOUNTER — Encounter: Payer: Self-pay | Admitting: Primary Care

## 2019-06-05 VITALS — BP 122/68 | HR 64 | Temp 98.3°F | Ht 68.0 in | Wt 171.5 lb

## 2019-06-05 DIAGNOSIS — M542 Cervicalgia: Secondary | ICD-10-CM | POA: Diagnosis not present

## 2019-06-05 DIAGNOSIS — Z6379 Other stressful life events affecting family and household: Secondary | ICD-10-CM

## 2019-06-05 DIAGNOSIS — I2510 Atherosclerotic heart disease of native coronary artery without angina pectoris: Secondary | ICD-10-CM

## 2019-06-05 DIAGNOSIS — E538 Deficiency of other specified B group vitamins: Secondary | ICD-10-CM | POA: Diagnosis not present

## 2019-06-05 DIAGNOSIS — G4700 Insomnia, unspecified: Secondary | ICD-10-CM

## 2019-06-05 DIAGNOSIS — E119 Type 2 diabetes mellitus without complications: Secondary | ICD-10-CM

## 2019-06-05 DIAGNOSIS — I10 Essential (primary) hypertension: Secondary | ICD-10-CM | POA: Diagnosis not present

## 2019-06-05 DIAGNOSIS — E2839 Other primary ovarian failure: Secondary | ICD-10-CM

## 2019-06-05 DIAGNOSIS — N289 Disorder of kidney and ureter, unspecified: Secondary | ICD-10-CM

## 2019-06-05 DIAGNOSIS — R945 Abnormal results of liver function studies: Secondary | ICD-10-CM

## 2019-06-05 DIAGNOSIS — Z0000 Encounter for general adult medical examination without abnormal findings: Secondary | ICD-10-CM | POA: Diagnosis not present

## 2019-06-05 DIAGNOSIS — K219 Gastro-esophageal reflux disease without esophagitis: Secondary | ICD-10-CM

## 2019-06-05 DIAGNOSIS — E782 Mixed hyperlipidemia: Secondary | ICD-10-CM

## 2019-06-05 DIAGNOSIS — R7989 Other specified abnormal findings of blood chemistry: Secondary | ICD-10-CM

## 2019-06-05 MED ORDER — AMLODIPINE BESYLATE 5 MG PO TABS: 5.0000 mg | ORAL_TABLET | Freq: Every day | ORAL | 0 refills | Status: DC

## 2019-06-05 NOTE — Assessment & Plan Note (Signed)
Immunizations UTD except for pneumonia vaccination which was provided today. Mammogram UTD. Colonoscopy UTD. Bone density scan due, orders placed. Encouraged her to work on diet, get some exercise. Exam stable. Labs reviewed.

## 2019-06-05 NOTE — Assessment & Plan Note (Signed)
Recent B12 level too high on labs. Decrease to 1000 mcg every other day. Continue to monitor.

## 2019-06-05 NOTE — Assessment & Plan Note (Signed)
Stable on recent labs. Continue to monitor.  

## 2019-06-05 NOTE — Assessment & Plan Note (Signed)
Overall doing better with Zoloft 25 mg. Denies SI/HI. She will update.

## 2019-06-05 NOTE — Assessment & Plan Note (Signed)
LDL at decent goal on Zetia. Intolerant to statin therapy. Continue zetia.

## 2019-06-05 NOTE — Assessment & Plan Note (Signed)
Chronic, slight decrease.  Removed losartan from regimen given hyperkalemia. Continue to closely monitor.

## 2019-06-05 NOTE — Assessment & Plan Note (Signed)
Chronic, overall stable. ?Continue to monitor. ? ?

## 2019-06-05 NOTE — Assessment & Plan Note (Addendum)
Stable in the office today on losartan 50 mg but BMP and repeat BMP with hyperkalemia. She is not taking OTC potassium or consuming a lot of potassium.   Stop losartan. Switch to amlodipine 10 mg.  We will plan to see her back in the office in 2 weeks for follow up and repeat BMP.

## 2019-06-05 NOTE — Patient Instructions (Signed)
Stop taking losartan 50 mg tablets for high blood pressure.  Start Amlodipine 5 mg tablets once daily for blood pressure.   [facility].   It is important that you improve your diet. Please limit carbohydrates in the form of white bread, rice, pasta, sweets, fast food, fried food, sugary drinks, etc. Increase your consumption of fresh fruits and vegetables, whole grains, lean protein.  Ensure you are consuming 64 ounces of water daily.  Start exercising. You should be getting 150 minutes of exercise weekly.  Schedule a follow up visit for blood pressure check for two weeks.  It was a pleasure to see you today!   Preventive Care 27 Years and Older, Female Preventive care refers to lifestyle choices and visits with your health care provider that can promote health and wellness. This includes:  A yearly physical exam. This is also called an annual well check.  Regular dental and eye exams.  Immunizations.  Screening for certain conditions.  Healthy lifestyle choices, such as diet and exercise. What can I expect for my preventive care visit? Physical exam Your health care provider will check:  Height and weight. These may be used to calculate body mass index (BMI), which is a measurement that tells if you are at a healthy weight.  Heart rate and blood pressure.  Your skin for abnormal spots. Counseling Your health care provider may ask you questions about:  Alcohol, tobacco, and drug use.  Emotional well-being.  Home and relationship well-being.  Sexual activity.  Eating habits.  History of falls.  Memory and ability to understand (cognition).  Work and work Statistician.  Pregnancy and menstrual history. What immunizations do I need?  Influenza (flu) vaccine  This is recommended every year. Tetanus, diphtheria, and pertussis (Tdap) vaccine  You may need a Td booster every 10 years. Varicella (chickenpox) vaccine   You may need this vaccine if you have not already been vaccinated. Zoster (shingles) vaccine  You may need this after age 82. Pneumococcal conjugate (PCV13) vaccine  One dose is recommended after age 92. Pneumococcal polysaccharide (PPSV23) vaccine  One dose is recommended after age 51. Measles, mumps, and rubella (MMR) vaccine  You may need at least one dose of MMR if you were born in 1957 or later. You may also need a second dose. Meningococcal conjugate (MenACWY) vaccine  You may need this if you have certain conditions. Hepatitis A vaccine  You may need this if you have certain conditions or if you travel or work in places where you may be exposed to hepatitis A. Hepatitis B vaccine  You may need this if you have certain conditions or if you travel or work in places where you may be exposed to hepatitis B. Haemophilus influenzae type b (Hib) vaccine  You may need this if you have certain conditions. You may receive vaccines as individual doses or as more than one vaccine together in one shot (combination vaccines). Talk with your health care provider about the risks and benefits of combination vaccines. What tests do I need? Blood tests  Lipid and cholesterol levels. These may be checked every 5 years, or more frequently depending on your overall health.  Hepatitis C test.  Hepatitis B test. Screening  Lung cancer screening. You may have this screening every year starting at age 28 if you have a 30-pack-year history of smoking and currently smoke or have quit within the past 15 years.  Colorectal cancer screening. All adults should  have this screening starting at age 59 and continuing until age 35. Your health care provider may recommend screening at age 19 if you are at increased risk. You will have tests every 1-10 years, depending on your results and the type of screening test.  Diabetes screening. This is done by checking your blood sugar (glucose) after you have not  eaten for a while (fasting). You may have this done every 1-3 years.  Mammogram. This may be done every 1-2 years. Talk with your health care provider about how often you should have regular mammograms.  BRCA-related cancer screening. This may be done if you have a family history of breast, ovarian, tubal, or peritoneal cancers. Other tests  Sexually transmitted disease (STD) testing.  Bone density scan. This is done to screen for osteoporosis. You may have this done starting at age 61. Follow these instructions at home: Eating and drinking  Eat a diet that includes fresh fruits and vegetables, whole grains, lean protein, and low-fat dairy products. Limit your intake of foods with high amounts of sugar, saturated fats, and salt.  Take vitamin and mineral supplements as recommended by your health care provider.  Do not drink alcohol if your health care provider tells you not to drink.  If you drink alcohol: ? Limit how much you have to 0-1 drink a day. ? Be aware of how much alcohol is in your drink. In the [place] one drink equals one 12 oz bottle of beer (355 mL), one 5 oz glass of wine (148 mL), or one 1 oz glass of hard liquor (44 mL). Lifestyle  Take daily care of your teeth and gums.  Stay active. Exercise for at least 30 minutes on 5 or more days each week.  Do not use any products that contain nicotine or tobacco, such as cigarettes, e-cigarettes, and chewing tobacco. If you need help quitting, ask your health care provider.  If you are sexually active, practice safe sex. Use a condom or other form of protection in order to prevent STIs (sexually transmitted infections).  Talk with your health care provider about taking a low-dose aspirin or statin. What's next?  Go to your health care provider once a year for a well check visit.  Ask your health care provider how often you should have your eyes and teeth checked.  Stay up to date on all vaccines. This information is not  intended to replace advice given to you by your health care provider. Make sure you discuss any questions you have with your health care provider. Document Released: 12/12/2015 Document Revised: 11/09/2018 Document Reviewed: 11/09/2018 Elsevier Patient Education  2020 [employer].

## 2019-06-05 NOTE — Progress Notes (Signed)
Subjective:    Patient ID: [name] [name], female    DOB: Aug 27, 1932, 85 y.o.   MRN: [id number]  HPI  [name] [name] is an 85 year old female who presents today for complete physical.  Immunizations: -Tetanus: Completed in 2000 -Influenza: Duet his season -Pneumonia: Completed in 2005, 2015 -Shingles: Completed Zostavax in 2011  Diet: She's been eating more pasta recently, also eating sandwiches, ice cream, vegetables, some lean protein and potatoes. She is drinking water mostly, occasionally sugar free flavored water. She is eating desserts 3-4 days weekly on average.  Exercise: She is walking some, no regular exercise   Eye exam: Due in September 2020 Dental exam: Completes regularly Colonoscopy: Completed in 2012 Mammogram: Completed in May 2020 Dexa: Completed in 2018, due  BP Readings from Last 3 Encounters:  06/05/19 122/68  04/30/19 120/70  04/24/19 120/82   She is checking her blood sugars once daily in the morning and is getting readings of:  AM fasting: 150-175 over the last several months, 200-250 over the last 7 days.  Wt Readings from Last 3 Encounters:  06/05/19 171 lb 8 oz (77.8 kg)  04/30/19 170 lb 8 oz (77.3 kg)  04/24/19 169 lb (76.7 kg)     Review of Systems  Constitutional: Negative for unexpected weight change.  HENT: Negative for rhinorrhea.   Respiratory: Negative for cough and shortness of breath.   Cardiovascular: Negative for chest pain.  Gastrointestinal: Negative for constipation and diarrhea.  Genitourinary: Negative for difficulty urinating.  Musculoskeletal: Positive for arthralgias.       Chronic neck and generalized arthritis pain  Skin: Negative for rash.  Allergic/Immunologic: Negative for environmental allergies.  Neurological: Negative for dizziness, numbness and headaches.  Psychiatric/Behavioral: Negative for suicidal ideas.       Feels that Zoloft 25 mg has helped.        Past Medical History:  Diagnosis Date  . Allergy    Cipro, Plavix, Statins  . CAD (coronary artery disease)    a. s/p tandem Promus DES to LAD in 2009 by Dr. Olevia Perches;  b.  LHC (03/22/14):  prox LAD 30%, mid LAD 40%, LAD stent ok with dist 20% ISR, apical LAD occluded with L-L collats filling apical vessel (too small for PCI), mid RCA 20%, EF 70%.  Med Rx  . Diabetes mellitus    Diagnosed 2012  . Diverticulosis of colon (without mention of hemorrhage)   . GERD (gastroesophageal reflux disease)   . Hx of cardiovascular stress test    a. Nuclear (08/2013):  No ischemia, EF 80%, Normal  . Hx of echocardiogram    a. Echo (07/2013):  Mild LVH, vigorous LVF, EF 65-70%, Gr 1 DD, mild MR, mild to mod LAE  . Hyperlipidemia    intol of statins  . Hypertension   . NHL (non-Hodgkin's lymphoma) ([place]) dx'd 2003   chemo/xrt comp 2003  . Personal history of colonic polyps   . Proctitis   . Urticaria      Social History   Socioeconomic History  . Marital status: Married    Spouse name: Not on file  . Number of children: Not on file  . Years of education: Not on file  . Highest education level: Not on file  Occupational History  . Occupation: Retired    Fish farm manager: RETIRED  Social Needs  . Financial resource strain: Not on file  . Food insecurity    Worry: Not on file    Inability: Not on file  .  Transportation needs    Medical: Not on file    Non-medical: Not on file  Tobacco Use  . Smoking status: Never Smoker  . Smokeless tobacco: Never Used  Substance and Sexual Activity  . Alcohol use: No  . Drug use: No  . Sexual activity: Not Currently  Lifestyle  . Physical activity    Days per week: Not on file    Minutes per session: Not on file  . Stress: Not on file  Relationships  . Social Herbalist on phone: Not on file    Gets together: Not on file    Attends religious service: Not on file    Active member of club or organization: Not on file    Attends meetings of clubs or organizations: Not on file    Relationship status:  Not on file  . Intimate partner violence    Fear of current or ex partner: Not on file    Emotionally abused: Not on file    Physically abused: Not on file    Forced sexual activity: Not on file  Other Topics Concern  . Not on file  Social History Narrative  . Not on file    Past Surgical History:  Procedure Laterality Date  . cardiac cath-neg    . CORONARY ANGIOPLASTY WITH STENT PLACEMENT     x 2  . dexa-neg    . KNEE SURGERY  10/2003   left  . laser surgery for cataracts-left    . LEFT HEART CATHETERIZATION WITH CORONARY ANGIOGRAM N/A 03/22/2014   Procedure: LEFT HEART CATHETERIZATION WITH CORONARY ANGIOGRAM;  Surgeon: Burnell Blanks, MD;  Location: [facility] CATH LAB;  Service: Cardiovascular;  Laterality: N/A;  . stress cardiolite    . VAGINAL HYSTERECTOMY     partial, fibroids, one ovary left    Family History  Problem Relation Age of Onset  . Cancer Mother        jaw  . Hypertension Father   . Heart attack Father   . Heart attack Sister   . Colon cancer Neg Hx     Allergies  Allergen Reactions  . Other Rash and Anaphylaxis  . Shellfish Allergy Anaphylaxis and Rash  . Cephalexin     REACTION: trash and swelling REACTION: trash and swelling  . Ciprofloxacin     REACTION: u/k  . Clopidogrel Bisulfate     REACTION: swelling, rash  . Codeine Phosphate     REACTION: unspecified  . Ezetimibe-Simvastatin     REACTION: myalgias REACTION: myalgias  . Hydrocod Polst-Cpm Polst Er     REACTION: u/k  . Lidocaine Hives    ONLY TO ADHESIVE LIDOCAINE PATCHES. NO PROBLEM WITH INJECTABLE LIDOCAINE.  Marland Kitchen Nitrofurantoin     REACTION: [place] REACTION: [place]  . Pregabalin     REACTION: felt bad REACTION: felt bad  . Propoxyphene N-Acetaminophen     REACTION: [place]  . Rofecoxib     unk unk   . Sulfamethoxazole     REACTION: unspecified  . Sulfonamide Derivatives     REACTION: unspecified  . Celecoxib Rash    REACTION: [place] REACTION: [place]  . Codeine Rash    REACTION: [place]  REACTION: [place]  . Hydrocod Polst-Cpm Polst Er Rash  . Propoxyphene Rash  . Sulfa Antibiotics Rash    REACTION: unspecified REACTION: unspecified    Current Outpatient Medications on File Prior to Visit  Medication Sig Dispense Refill  . aspirin (ASPIRIN LOW DOSE) 81 MG tablet Take  81 mg by mouth daily.      . blood glucose meter kit and supplies Dispense based on patient and insurance preference. Use up to times times daily as directed. (FOR ICD-10 E10.9, E11.9). 1 each 0  . brimonidine-timolol (COMBIGAN) 0.2-0.5 % ophthalmic solution Place 1 drop into both eyes every 12 (twelve) hours.    . Cholecalciferol (VITAMIN D-3) 1000 UNITS CAPS Take 1 capsule by mouth daily.    . cyclobenzaprine (FLEXERIL) 5 MG tablet Take 1 tablet (5 mg total) by mouth 3 (three) times daily as needed for muscle spasms. 30 tablet 0  . ezetimibe (ZETIA) 10 MG tablet TAKE ONE TABLET BY MOUTH DAILY 90 tablet 2  . fexofenadine (ALLEGRA) 180 MG tablet Take 1 tablet (180 mg total) by mouth as needed for allergies or rhinitis. 90 tablet 0  . fluticasone (FLONASE) 50 MCG/ACT nasal spray Place 2 sprays into both nostrils as needed for allergies or rhinitis.    . glipiZIDE (GLUCOTROL XL) 10 MG 24 hr tablet TAKE ONE TABLET BY MOUTH EVERY MORNING WITH BREAKFAST 90 tablet 1  . glucose blood (ONE TOUCH ULTRA TEST) test strip USE UP TO 4 TIMES DAILY TO CHECK BLOOD SUGAR AS DIRECTED 100 each 2  . isosorbide mononitrate (IMDUR) 60 MG 24 hr tablet Take 1 tablet (60 mg total) by mouth daily. 90 tablet 3  . metoprolol succinate (TOPROL-XL) 100 MG 24 hr tablet TAKE ONE TABLET BY MOUTH EVERY MORNING WITH OR IMMEDIATELY FOLLOWING A MEAL 90 tablet 3  . nitroGLYCERIN (NITROSTAT) 0.4 MG SL tablet Place 1 tablet (0.4 mg total) under the tongue every 5 (five) minutes as needed for chest pain. 26 tablet 0  . OVER THE COUNTER MEDICATION Take 1 tablet by mouth daily. Med name: VISION FORMULA    . pantoprazole (PROTONIX) 40 MG tablet TAKE ONE TABLET  BY MOUTH DAILY 90 tablet 1  . sertraline (ZOLOFT) 25 MG tablet Take 1 tablet (25 mg total) by mouth daily. For stress. 90 tablet 3  . timolol (TIMOPTIC) 0.5 % ophthalmic solution     . trimethoprim (TRIMPEX) 100 MG tablet Take 100 mg by mouth 2 (two) times daily as needed (UTI).     . vitamin B-12 (CYANOCOBALAMIN) 1000 MCG tablet Take 1,000 mcg by mouth daily.    . [DISCONTINUED] loratadine (CLARITIN) 10 MG tablet Take 10 mg by mouth as needed.      No current facility-administered medications on file prior to visit.     BP 122/68   Pulse 64   Temp 98.3 F (36.8 C) (Temporal)   Ht 5' 8" (1.727 m)   Wt 171 lb 8 oz (77.8 kg)   SpO2 97%   BMI 26.08 kg/m    Objective:   Physical Exam  Constitutional: She is oriented to person, place, and time. She appears well-nourished.  HENT:  Mouth/Throat: No oropharyngeal exudate.  Eyes: Pupils are equal, round, and reactive to light. EOM are normal.  Neck: Neck supple. No thyromegaly present.  Cardiovascular: Normal rate and regular rhythm.  Respiratory: Effort normal and breath sounds normal.  GI: Soft. Bowel sounds are normal. There is no abdominal tenderness.  Musculoskeletal:     Comments: Ambulatory in clinic without difficulty or assistive device   Neurological: She is alert and oriented to person, place, and time.  Skin: Skin is warm and dry.  Psychiatric: She has a normal mood and affect.           Assessment & Plan:   

## 2019-06-05 NOTE — Assessment & Plan Note (Signed)
LDL at decent goal considering that she cannot tolerate statins and that she's on Zetia. Continue current regimen. Following with cardiology. BP under good control.

## 2019-06-05 NOTE — Assessment & Plan Note (Signed)
Recent spike in A1C to 7.5 which is likely from diet.  Overall considering her age this is decent control. Do recommend she work on limiting ice cream, pasta, processed carbohydrates.   Had to remove losartan given hyperkalemia. Will check urine microalbumin next year. Eye exam UTD. Pneumonia vaccination UTD.  Follow up in 6 months for diabetes check.

## 2019-06-05 NOTE — Assessment & Plan Note (Signed)
Chronic and stable on pantoprazole. Continue same.

## 2019-06-05 NOTE — Assessment & Plan Note (Signed)
Repeat bone density due, orders placed. Compliant to calcium and vitamin D.

## 2019-06-05 NOTE — Assessment & Plan Note (Signed)
Doing better on Zoloft 25 mg daily. Continue same. Denies SI/HI

## 2019-06-19 ENCOUNTER — Ambulatory Visit: Payer: Medicare Other | Admitting: Primary Care

## 2019-06-20 ENCOUNTER — Ambulatory Visit: Payer: Medicare Other | Admitting: Primary Care

## 2019-06-20 ENCOUNTER — Other Ambulatory Visit: Payer: Self-pay

## 2019-06-20 VITALS — BP 124/66 | HR 64 | Temp 97.8°F | Ht 68.0 in | Wt 169.5 lb

## 2019-06-20 DIAGNOSIS — K219 Gastro-esophageal reflux disease without esophagitis: Secondary | ICD-10-CM | POA: Diagnosis not present

## 2019-06-20 DIAGNOSIS — I10 Essential (primary) hypertension: Secondary | ICD-10-CM

## 2019-06-20 LAB — BASIC METABOLIC PANEL
BUN: 25 mg/dL — ABNORMAL HIGH (ref 6–23)
CO2: 29 mEq/L (ref 19–32)
Calcium: 9.6 mg/dL (ref 8.4–10.5)
Chloride: 103 mEq/L (ref 96–112)
Creatinine, Ser: 1.25 mg/dL — ABNORMAL HIGH (ref 0.40–1.20)
GFR: 40.69 mL/min — ABNORMAL LOW (ref >60.00)
Glucose, Bld: 218 mg/dL — ABNORMAL HIGH (ref 70–99)
Potassium: 5.1 mEq/L (ref 3.5–5.1)
Sodium: 138 mEq/L (ref 135–145)

## 2019-06-20 MED ORDER — AMLODIPINE BESYLATE 5 MG PO TABS: 5.0000 mg | ORAL_TABLET | Freq: Every day | ORAL | 3 refills | Status: DC

## 2019-06-20 MED ORDER — PANTOPRAZOLE SODIUM 40 MG PO TBEC: 40.0000 mg | DELAYED_RELEASE_TABLET | Freq: Every day | ORAL | 3 refills | Status: DC

## 2019-06-20 NOTE — Progress Notes (Signed)
Subjective:    Patient ID: [name] [name], female    DOB: June 03, 1932, 85 y.o.   MRN: [id number]  HPI  [name] [name] is an 85 year old female who presents today for follow up of hypertension.  She was last evaluated two weeks ago for follow up and labs were noted to have higher potassium on several checks. She was managed on losartan 50 mg, not taking any supplemental potassium so we stopped her losartan and switched her to Amlodipine 5 mg.   Since her last visit she's not had any problems with Amlodipine. She is compliant to Imdur, metoprolol succinate. She has noticed slight ankle edema which is not bothersome. Denies chest pain, increased dizziness.   BP Readings from Last 3 Encounters:  06/20/19 124/66  06/05/19 122/68  04/30/19 120/70     Review of Systems  Eyes: Negative for visual disturbance.  Respiratory: Negative for shortness of breath.   Cardiovascular: Negative for chest pain.  Neurological: Negative for dizziness.       Past Medical History:  Diagnosis Date  . Allergy    Cipro, Plavix, Statins  . CAD (coronary artery disease)    a. s/p tandem Promus DES to LAD in 2009 by Dr. Olevia [name];  b.  LHC (03/22/14):  prox LAD 30%, mid LAD 40%, LAD stent ok with dist 20% ISR, apical LAD occluded with L-L collats filling apical vessel (too small for PCI), mid RCA 20%, EF 70%.  Med Rx  . Diabetes mellitus    Diagnosed 2012  . Diverticulosis of colon (without mention of hemorrhage)   . GERD (gastroesophageal reflux disease)   . Hx of cardiovascular stress test    a. Nuclear (08/2013):  No ischemia, EF 80%, Normal  . Hx of echocardiogram    a. Echo (07/2013):  Mild LVH, vigorous LVF, EF 65-70%, Gr 1 DD, mild MR, mild to mod LAE  . Hyperlipidemia    intol of statins  . Hypertension   . NHL (non-Hodgkin's lymphoma) ([place]) dx'd 2003   chemo/xrt comp 2003  . Personal history of colonic polyps   . Proctitis   . Pruritic intertrigo 07/18/2014  . Urticaria      Social History    Socioeconomic History  . Marital status: Married    Spouse name: Not on file  . Number of children: Not on file  . Years of education: Not on file  . Highest education level: Not on file  Occupational History  . Occupation: Retired    Fish farm manager: RETIRED  Social Needs  . Financial resource strain: Not on file  . Food insecurity    Worry: Not on file    Inability: Not on file  . Transportation needs    Medical: Not on file    Non-medical: Not on file  Tobacco Use  . Smoking status: Never Smoker  . Smokeless tobacco: Never Used  Substance and Sexual Activity  . Alcohol use: No  . Drug use: No  . Sexual activity: Not Currently  Lifestyle  . Physical activity    Days per week: Not on file    Minutes per session: Not on file  . Stress: Not on file  Relationships  . Social Herbalist on phone: Not on file    Gets together: Not on file    Attends religious service: Not on file    Active member of club or organization: Not on file    Attends meetings of clubs or organizations: Not on  file    Relationship status: Not on file  . Intimate partner violence    Fear of current or ex partner: Not on file    Emotionally abused: Not on file    Physically abused: Not on file    Forced sexual activity: Not on file  Other Topics Concern  . Not on file  Social History Narrative  . Not on file    Past Surgical History:  Procedure Laterality Date  . cardiac cath-neg    . CORONARY ANGIOPLASTY WITH STENT PLACEMENT     x 2  . dexa-neg    . KNEE SURGERY  10/2003   left  . laser surgery for cataracts-left    . LEFT HEART CATHETERIZATION WITH CORONARY ANGIOGRAM N/A 03/22/2014   Procedure: LEFT HEART CATHETERIZATION WITH CORONARY ANGIOGRAM;  Surgeon: Burnell Blanks, MD;  Location: [facility] CATH LAB;  Service: Cardiovascular;  Laterality: N/A;  . stress cardiolite    . VAGINAL HYSTERECTOMY     partial, fibroids, one ovary left    Family History  Problem Relation Age of  Onset  . Cancer Mother        jaw  . Hypertension Father   . Heart attack Father   . Heart attack Sister   . Colon cancer Neg Hx     Allergies  Allergen Reactions  . Other Rash and Anaphylaxis  . Shellfish Allergy Anaphylaxis and Rash  . Cephalexin     REACTION: trash and swelling REACTION: trash and swelling  . Ciprofloxacin     REACTION: u/k  . Clopidogrel Bisulfate     REACTION: swelling, rash  . Codeine Phosphate     REACTION: unspecified  . Ezetimibe-Simvastatin     REACTION: myalgias REACTION: myalgias  . Hydrocod Polst-Cpm Polst Er     REACTION: u/k  . Lidocaine Hives    ONLY TO ADHESIVE LIDOCAINE PATCHES. NO PROBLEM WITH INJECTABLE LIDOCAINE.  Marland Kitchen Nitrofurantoin     REACTION: [place] REACTION: [place]  . Pregabalin     REACTION: felt bad REACTION: felt bad  . Propoxyphene N-Acetaminophen     REACTION: [place]  . Rofecoxib     unk unk   . Sulfamethoxazole     REACTION: unspecified  . Sulfonamide Derivatives     REACTION: unspecified  . Celecoxib Rash    REACTION: [place] REACTION: [place]  . Codeine Rash    REACTION: [place] REACTION: [place]  . Hydrocod Polst-Cpm Polst Er Rash  . Propoxyphene Rash  . Sulfa Antibiotics Rash    REACTION: unspecified REACTION: unspecified    Current Outpatient Medications on File Prior to Visit  Medication Sig Dispense Refill  . amLODipine (NORVASC) 5 MG tablet Take 1 tablet (5 mg total) by mouth daily. For blood pressure. 30 tablet 0  . aspirin (ASPIRIN LOW DOSE) 81 MG tablet Take 81 mg by mouth daily.      . blood glucose meter kit and supplies Dispense based on patient and insurance preference. Use up to times times daily as directed. (FOR ICD-10 E10.9, E11.9). 1 each 0  . brimonidine-timolol (COMBIGAN) 0.2-0.5 % ophthalmic solution Place 1 drop into both eyes every 12 (twelve) hours.    . Cholecalciferol (VITAMIN D-3) 1000 UNITS CAPS Take 1 capsule by mouth daily.    . cyclobenzaprine (FLEXERIL) 5 MG tablet Take 1 tablet (5 mg total) by mouth 3  (three) times daily as needed for muscle spasms. 30 tablet 0  . ezetimibe (ZETIA) 10 MG tablet TAKE ONE TABLET BY MOUTH DAILY  90 tablet 2  . fexofenadine (ALLEGRA) 180 MG tablet Take 1 tablet (180 mg total) by mouth as needed for allergies or rhinitis. 90 tablet 0  . fluticasone (FLONASE) 50 MCG/ACT nasal spray Place 2 sprays into both nostrils as needed for allergies or rhinitis.    Marland Kitchen glipiZIDE (GLUCOTROL XL) 10 MG 24 hr tablet TAKE ONE TABLET BY MOUTH EVERY MORNING WITH BREAKFAST 90 tablet 1  . glucose blood (ONE TOUCH ULTRA TEST) test strip USE UP TO 4 TIMES DAILY TO CHECK BLOOD SUGAR AS DIRECTED 100 each 2  . isosorbide mononitrate (IMDUR) 60 MG 24 hr tablet Take 1 tablet (60 mg total) by mouth daily. 90 tablet 3  . metoprolol succinate (TOPROL-XL) 100 MG 24 hr tablet TAKE ONE TABLET BY MOUTH EVERY MORNING WITH OR IMMEDIATELY FOLLOWING A MEAL 90 tablet 3  . nitroGLYCERIN (NITROSTAT) 0.4 MG SL tablet Place 1 tablet (0.4 mg total) under the tongue every 5 (five) minutes as needed for chest pain. 26 tablet 0  . OVER THE COUNTER MEDICATION Take 1 tablet by mouth daily. Med name: VISION FORMULA    . pantoprazole (PROTONIX) 40 MG tablet TAKE ONE TABLET BY MOUTH DAILY 90 tablet 1  . sertraline (ZOLOFT) 25 MG tablet Take 1 tablet (25 mg total) by mouth daily. For stress. 90 tablet 3  . timolol (TIMOPTIC) 0.5 % ophthalmic solution     . trimethoprim (TRIMPEX) 100 MG tablet Take 100 mg by mouth 2 (two) times daily as needed (UTI).     . vitamin B-12 (CYANOCOBALAMIN) 1000 MCG tablet Take 1,000 mcg by mouth daily.    . [DISCONTINUED] loratadine (CLARITIN) 10 MG tablet Take 10 mg by mouth as needed.      No current facility-administered medications on file prior to visit.     BP 124/66   Pulse 64   Temp 97.8 F (36.6 C) (Temporal)   Ht _0  (1.727 m)   Wt 169 lb 8 oz (76.9 kg)   SpO2 98%   BMI 25.77 kg/m    Objective:   Physical Exam  Constitutional: She appears well-nourished.  Neck: Neck  supple.  Cardiovascular: Normal rate and regular rhythm.  Respiratory: Effort normal and breath sounds normal.  Skin: Skin is warm and dry.           Assessment & Plan:

## 2019-06-20 NOTE — Assessment & Plan Note (Signed)
Stable with switch from losartan to amlodipine. Repeat potassium level pending. Continue other BP medications.

## 2019-06-20 NOTE — Patient Instructions (Signed)
Stop by the lab prior to leaving today. I will notify you of your results once received.   Continue taking Amlodipine 5 mg tablets daily for blood pressure along with your other medications.  It was a pleasure to see you today!

## 2019-07-02 ENCOUNTER — Other Ambulatory Visit: Payer: Self-pay | Admitting: Primary Care

## 2019-07-02 DIAGNOSIS — I10 Essential (primary) hypertension: Secondary | ICD-10-CM

## 2019-07-03 ENCOUNTER — Encounter: Payer: Self-pay | Admitting: Primary Care

## 2019-07-03 LAB — HM DIABETES EYE EXAM

## 2019-07-04 ENCOUNTER — Telehealth: Payer: Self-pay | Admitting: Primary Care

## 2019-07-04 NOTE — Telephone Encounter (Signed)
Message left for patient to return my call.  

## 2019-07-04 NOTE — Telephone Encounter (Signed)
Please notify patient that her bone density scan shows osteopenia in numerous sites. Needs to be taking 1200 mg of calcium and at least 800 units of vitamin D daily. Also needs weight bearing exercise. We will repeat this scan in 1 year.

## 2019-07-09 NOTE — Telephone Encounter (Signed)
Spoken and notified patient of Kate [name]'s comments. Patient verbalized understanding.  

## 2019-07-13 ENCOUNTER — Encounter: Payer: Self-pay | Admitting: Family Medicine

## 2019-07-13 ENCOUNTER — Ambulatory Visit (HOSPITAL_COMMUNITY)
Admission: RE | Admit: 2019-07-13 | Discharge: 2019-07-13 | Disposition: A | Discharge status: Home / Self Care | Payer: Medicare Other | Source: Ambulatory Visit | Attending: Family Medicine | Admitting: Family Medicine

## 2019-07-13 ENCOUNTER — Other Ambulatory Visit: Payer: Self-pay

## 2019-07-13 ENCOUNTER — Ambulatory Visit (INDEPENDENT_AMBULATORY_CARE_PROVIDER_SITE_OTHER)
Admission: RE | Admit: 2019-07-13 | Discharge: 2019-07-13 | Disposition: A | Discharge status: Home / Self Care | Payer: Medicare Other | Source: Ambulatory Visit | Attending: Family Medicine | Admitting: Family Medicine

## 2019-07-13 ENCOUNTER — Telehealth: Payer: Self-pay

## 2019-07-13 ENCOUNTER — Ambulatory Visit: Payer: Medicare Other | Admitting: Family Medicine

## 2019-07-13 VITALS — BP 130/78 | HR 65 | Temp 98.1°F | Ht 68.0 in | Wt 168.2 lb

## 2019-07-13 DIAGNOSIS — M25532 Pain in left wrist: Secondary | ICD-10-CM

## 2019-07-13 DIAGNOSIS — W19XXXA Unspecified fall, initial encounter: Secondary | ICD-10-CM | POA: Diagnosis not present

## 2019-07-13 DIAGNOSIS — R9082 White matter disease, unspecified: Secondary | ICD-10-CM | POA: Diagnosis not present

## 2019-07-13 DIAGNOSIS — M25561 Pain in right knee: Secondary | ICD-10-CM | POA: Diagnosis not present

## 2019-07-13 DIAGNOSIS — M25562 Pain in left knee: Secondary | ICD-10-CM

## 2019-07-13 DIAGNOSIS — G319 Degenerative disease of nervous system, unspecified: Secondary | ICD-10-CM | POA: Insufficient documentation

## 2019-07-13 DIAGNOSIS — M25521 Pain in right elbow: Secondary | ICD-10-CM

## 2019-07-13 DIAGNOSIS — S0990XA Unspecified injury of head, initial encounter: Secondary | ICD-10-CM

## 2019-07-13 NOTE — Patient Instructions (Addendum)
Stop at front to set up CT scan on the way out. NO wrist/radial fracture...  Start home physical therapy exercsies, ICE wrists and knees.  Wear thumb spica splint on wrist and hand.  Elevate as able.  Follow up with sports med if not  gradaullyimproving in  2 weeks.

## 2019-07-13 NOTE — Progress Notes (Signed)
Chief Complaint  Patient presents with  . Fall    yesterday  . Wrist Pain    Left  . Joint Swelling    Right elbow  . Knee Pain    Bilateral    History of Present Illness: HPI  [name] with diabetes and osteopenia presents  Following a fall yesterday.  She reports  She was [facility] yesterday with her debilitated husband... she was walking down steps and she missed a step.  No preceding symptoms.. no heart racing, no dizziness, no CP, no SOB.   Hit her hear on floor on left face near eye, landed on bilateral knees.Marland Kitchen left wrist and right elbow.  No LOC.  Was dazed fro 1-2 minutes after fall then able to stand with help. Observed and evaluated on site by MD present.   No headache, no vision changes. Soreness in left temple.   She has been having very severe pain in left wrist. Caught herself on hands.  Pain with trying hold things in  Left hand.  Bruising and pain at base of left thumb down into wrist.   ON baby as[pirin.  COVID 19 screen No recent travel or known exposure to COVID19 The patient denies respiratory symptoms of COVID 19 at this time.  The importance of social distancing was discussed today.   Review of Systems  Constitutional: Negative for chills and fever.  HENT: Negative for congestion and ear pain.   Eyes: Negative for pain and redness.  Respiratory: Negative for cough and shortness of breath.   Cardiovascular: Negative for chest pain, palpitations and leg swelling.  Gastrointestinal: Negative for abdominal pain, blood in stool, constipation, diarrhea, nausea and vomiting.  Genitourinary: Negative for dysuria.  Musculoskeletal: Negative for falls and myalgias.  Skin: Negative for rash.  Neurological: Negative for dizziness.  Psychiatric/Behavioral: Negative for depression. The patient is not nervous/anxious.       Past Medical History:  Diagnosis Date  . Allergy    Cipro, Plavix, Statins  . CAD  (coronary artery disease)    a. s/p tandem Promus DES to LAD in 2009 by Dr. Olevia Perches;  b.  LHC (03/22/14):  prox LAD 30%, mid LAD 40%, LAD stent ok with dist 20% ISR, apical LAD occluded with L-L collats filling apical vessel (too small for PCI), mid RCA 20%, EF 70%.  Med Rx  . Diabetes mellitus    Diagnosed 2012  . Diverticulosis of colon (without mention of hemorrhage)   . GERD (gastroesophageal reflux disease)   . Hx of cardiovascular stress test    a. Nuclear (08/2013):  No ischemia, EF 80%, Normal  . Hx of echocardiogram    a. Echo (07/2013):  Mild LVH, vigorous LVF, EF 65-70%, Gr 1 DD, mild MR, mild to mod LAE  . Hyperlipidemia    intol of statins  . Hypertension   . NHL (non-Hodgkin's lymphoma) ([place]) dx'd 2003   chemo/xrt comp 2003  . Personal history of colonic polyps   . Proctitis   . Pruritic intertrigo 07/18/2014  . Urticaria     reports that she has never smoked. She has never used smokeless tobacco. She reports that she does not drink alcohol or use drugs.   Current Outpatient Medications:  .  amLODipine (NORVASC) 5 MG tablet, Take 1 tablet (5 mg total) by mouth daily. For blood pressure., Disp: 90 tablet, Rfl: 3 .  aspirin (ASPIRIN LOW DOSE) 81 MG tablet, Take 81 mg by mouth  daily.  , Disp: , Rfl:  .  blood glucose meter kit and supplies, Dispense based on patient and insurance preference. Use up to times times daily as directed. (FOR ICD-10 E10.9, E11.9)., Disp: 1 each, Rfl: 0 .  brimonidine-timolol (COMBIGAN) 0.2-0.5 % ophthalmic solution, Place 1 drop into both eyes every 12 (twelve) hours., Disp: , Rfl:  .  Cholecalciferol (VITAMIN D-3) 1000 UNITS CAPS, Take 1 capsule by mouth daily., Disp: , Rfl:  .  cyclobenzaprine (FLEXERIL) 5 MG tablet, Take 1 tablet (5 mg total) by mouth 3 (three) times daily as needed for muscle spasms., Disp: 30 tablet, Rfl: 0 .  ezetimibe (ZETIA) 10 MG tablet, TAKE ONE TABLET BY MOUTH DAILY, Disp: 90 tablet, Rfl: 2 .  fexofenadine (ALLEGRA) 180 MG  tablet, Take 1 tablet (180 mg total) by mouth as needed for allergies or rhinitis., Disp: 90 tablet, Rfl: 0 .  fluticasone (FLONASE) 50 MCG/ACT nasal spray, Place 2 sprays into both nostrils as needed for allergies or rhinitis., Disp: , Rfl:  .  glipiZIDE (GLUCOTROL XL) 10 MG 24 hr tablet, TAKE ONE TABLET BY MOUTH EVERY MORNING WITH BREAKFAST, Disp: 90 tablet, Rfl: 1 .  glucose blood (ONE TOUCH ULTRA TEST) test strip, USE UP TO 4 TIMES DAILY TO CHECK BLOOD SUGAR AS DIRECTED, Disp: 100 each, Rfl: 2 .  isosorbide mononitrate (IMDUR) 60 MG 24 hr tablet, Take 1 tablet (60 mg total) by mouth daily., Disp: 90 tablet, Rfl: 3 .  metoprolol succinate (TOPROL-XL) 100 MG 24 hr tablet, TAKE ONE TABLET BY MOUTH EVERY MORNING WITH OR IMMEDIATELY FOLLOWING A MEAL, Disp: 90 tablet, Rfl: 3 .  nitroGLYCERIN (NITROSTAT) 0.4 MG SL tablet, Place 1 tablet (0.4 mg total) under the tongue every 5 (five) minutes as needed for chest pain., Disp: 26 tablet, Rfl: 0 .  OVER THE COUNTER MEDICATION, Take 1 tablet by mouth daily. Med name: VISION FORMULA, Disp: , Rfl:  .  pantoprazole (PROTONIX) 40 MG tablet, Take 1 tablet (40 mg total) by mouth daily. For heartburn., Disp: 90 tablet, Rfl: 3 .  sertraline (ZOLOFT) 25 MG tablet, Take 1 tablet (25 mg total) by mouth daily. For stress., Disp: 90 tablet, Rfl: 3 .  timolol (TIMOPTIC) 0.5 % ophthalmic solution, , Disp: , Rfl:  .  trimethoprim (TRIMPEX) 100 MG tablet, Take 100 mg by mouth 2 (two) times daily as needed (UTI). , Disp: , Rfl:  .  vitamin B-12 (CYANOCOBALAMIN) 1000 MCG tablet, Take 1,000 mcg by mouth daily., Disp: , Rfl:    Observations/Objective: Blood pressure 130/78, pulse 65, temperature 98.1 F (36.7 C), temperature source Temporal, height 5' 8"  (1.727 m), weight 168 lb 4 oz (76.3 kg), SpO2 97 %.  Physical Exam Constitutional:      General: She is not in acute distress.    Appearance: Normal appearance. She is well-developed. She is not ill-appearing or  toxic-appearing.  HENT:     Head: Normocephalic.     Right Ear: Hearing, tympanic membrane, ear canal and external ear normal. Tympanic membrane is not erythematous, retracted or bulging.     Left Ear: Hearing, tympanic membrane, ear canal and external ear normal. Tympanic membrane is not erythematous, retracted or bulging.     Nose: No mucosal edema or rhinorrhea.     Right Sinus: No maxillary sinus tenderness or frontal sinus tenderness.     Left Sinus: No maxillary sinus tenderness or frontal sinus tenderness.     Mouth/Throat:     Pharynx: Uvula midline.  Eyes:     General: Lids are normal. Lids are everted, no foreign bodies appreciated.     Conjunctiva/sclera: Conjunctivae normal.     Pupils: Pupils are equal, round, and reactive to light.  Neck:     Musculoskeletal: Normal range of motion and neck supple.     Thyroid: No thyroid mass or thyromegaly.     Vascular: No carotid bruit.     Trachea: Trachea normal.  Cardiovascular:     Rate and Rhythm: Normal rate and regular rhythm.     Pulses: Normal pulses.     Heart sounds: Normal heart sounds, S1 normal and S2 normal. No murmur. No friction rub. No gallop.   Pulmonary:     Effort: Pulmonary effort is normal. No tachypnea or respiratory distress.     Breath sounds: Normal breath sounds. No decreased breath sounds, wheezing, rhonchi or rales.  Abdominal:     General: Bowel sounds are normal.     Palpations: Abdomen is soft.     Tenderness: There is no abdominal tenderness.  Musculoskeletal:     Comments: Left wrist: ttp over radail head, bruising, decreased ROM, swelling diffusely mildly Pain greatest with thumb adduction  bruising and mild pain over patella bilaterally, left > right.. full rang of motion  no swelling  Bruising, no pain in right elbow... full ROM  TTp and bruising on left eyebrow ridge, bruising but no pain surrounding eye   Full ROM and nml vision in left eye  Skin:    General: Skin is warm and dry.      Findings: No rash.  Neurological:     General: No focal deficit present.     Mental Status: She is alert and oriented to person, place, and time.     GCS: GCS eye subscore is 4. GCS verbal subscore is 5. GCS motor subscore is 6.     Cranial Nerves: Cranial nerves are intact.     Sensory: Sensation is intact.     Motor: Motor function is intact.     Coordination: Coordination is intact.  Psychiatric:        Mood and Affect: Mood is not anxious or depressed.        Speech: Speech normal.        Behavior: Behavior normal. Behavior is cooperative.        Thought Content: Thought content normal.        Judgment: Judgment normal.      Assessment and Plan    Accidental Fall on stair:  Resulting trauma to head, left wrist, bilateral knees and right wrist  Given osteopenia , age as well as pain in distal radius.Marland Kitchen eval with wrist film.  Also given age and level of impact.. send for head CT.. no cervical spine pain or decrease ROM and no neuro changes on exam.  Addendum: NO wrist/radial fracture... place pt in brace with ROM exercsies, ICE, NSAIDs prn and elevation.  Follow up with sports med if not  gradaullyimproving in  2 weeks. Eliezer Lofts, MD

## 2019-07-13 NOTE — Addendum Note (Signed)
Addended byEliezer Lofts E on: 07/13/2019 10:03 AM   Modules accepted: Orders

## 2019-07-13 NOTE — Telephone Encounter (Signed)
[facility]-CT [facility]AG[facility]  Called to confirm if pt was okay to leave following a STAT CT  I spoke to Dr Diona Browner and she stated it was okay for pt to leave

## 2019-08-01 ENCOUNTER — Ambulatory Visit: Payer: Medicare Other

## 2019-08-02 ENCOUNTER — Telehealth: Payer: Self-pay | Admitting: Primary Care

## 2019-08-02 NOTE — Telephone Encounter (Signed)
Spoken and notified patient of Kate [name]'s comments. Patient verbalized understanding.  

## 2019-08-02 NOTE — Telephone Encounter (Signed)
Have her try 1/2 tab of amlodipine daily, elevate legs, wear compression hose and get BP cuff to follow BP. If still swelling or BP > 140/90 by next week... have her call back.

## 2019-08-02 NOTE — Telephone Encounter (Signed)
Spoke with patient - Pt reports ankle swelling. States that her medication was changed to Amlodipine last OV 06/20/2019 and since she has been having increased ankle swelling. Pt states that her ankles get very large by the end of the day and swelling decreases some while she is sleeping. No pain. Pt does not check BP at home. Denies SOB or any breathing issues.  Switched from Losartan to Amlodipine 5mg   06/20/2019 Problem: Essential hypertension  Editor: Pleas Koch, NP (Nurse Practitioner)    Stable with switch from losartan to amlodipine. Repeat potassium level pending. Continue other BP medications.      Patient aware that K. Carlis Abbott, NP is not in office today and that this message will be sent to another Provider to address.  Pt does not have means of doing virtual visits, states that she prefers to come in office if needs to be seen.   Neg Covid screening.

## 2019-08-08 ENCOUNTER — Encounter: Payer: Medicare Other | Admitting: Primary Care

## 2019-08-13 ENCOUNTER — Other Ambulatory Visit: Payer: Self-pay | Admitting: *Deleted

## 2019-08-13 DIAGNOSIS — Z20822 Contact with and (suspected) exposure to covid-19: Secondary | ICD-10-CM

## 2019-08-14 ENCOUNTER — Other Ambulatory Visit: Payer: Self-pay | Admitting: Cardiovascular Disease

## 2019-08-14 LAB — NOVEL CORONAVIRUS, NAA: SARS-CoV-2, NAA: NOT DETECTED

## 2019-08-16 ENCOUNTER — Ambulatory Visit: Payer: Medicare Other

## 2019-08-16 ENCOUNTER — Other Ambulatory Visit: Payer: Self-pay | Admitting: Cardiovascular Disease

## 2019-08-16 ENCOUNTER — Encounter: Payer: Self-pay | Admitting: Primary Care

## 2019-08-16 ENCOUNTER — Ambulatory Visit (INDEPENDENT_AMBULATORY_CARE_PROVIDER_SITE_OTHER): Payer: Medicare Other | Admitting: Primary Care

## 2019-08-16 ENCOUNTER — Ambulatory Visit: Payer: Medicare Other | Admitting: Primary Care

## 2019-08-16 ENCOUNTER — Other Ambulatory Visit: Payer: Self-pay

## 2019-08-16 VITALS — BP 122/60 | HR 65 | Temp 97.7°F | Ht 68.0 in | Wt 169.5 lb

## 2019-08-16 DIAGNOSIS — I10 Essential (primary) hypertension: Secondary | ICD-10-CM

## 2019-08-16 DIAGNOSIS — Z23 Encounter for immunization: Secondary | ICD-10-CM

## 2019-08-16 DIAGNOSIS — M546 Pain in thoracic spine: Secondary | ICD-10-CM | POA: Diagnosis not present

## 2019-08-16 HISTORY — DX: Pain in thoracic spine: M54.6

## 2019-08-16 MED ORDER — TIZANIDINE HCL 4 MG PO TABS: 4.0000 mg | ORAL_TABLET | Freq: Three times a day (TID) | ORAL | 0 refills | Status: DC | PRN

## 2019-08-16 NOTE — Progress Notes (Signed)
Subjective:    Patient ID: [name] [name], female    DOB: [birth year], 85 y.o.   MRN: [id number]  HPI  [name] [name] is an 85 year old female with a history of arthralgias, falls, hypertension, hyperkalemia, type 2 diabetes, hyperlipidemia, chronic fatigue, vitamin B 12 deficiency who presents today with a chief complaint of back and elbow pain. She would also like to discuss ankle/foot swelling since on Amlodipine.   BP Readings from Last 3 Encounters:  08/16/19 122/60  07/13/19 130/78  06/20/19 124/66   She fell in August 2020 at [facility] while walking down a series of three steps. She took the first step and missed the second and third step. She fell forward landing onto her left side with bruising to the left eye. She was evaluated in our clinic on 07/13/19 and saw Dr. Diona Browner. She underwent CT head, xray of the left wrist which were both unremarkable.  Since her fall she's noticed "catching" with muscle spasms to the bilateral mid thoracic back. She's taken tylenol without much improvement. Her pain is better today.   She continues to notice bilateral ankle edema since starting Amlodipine 5 mg which was initiated in early August 2020 due to hyperkalemia. She was instructed on 08/02/19 to reduce to 1/2 tablet but she didn't get the message until Monday this week. She has been taking 1/2 tablet for the last three days without significant improvement. She is scheduled to see cardiology next month.  Review of Systems  Respiratory: Negative for shortness of breath.   Cardiovascular: Negative for chest pain.  Musculoskeletal: Positive for back pain and myalgias.       Past Medical History:  Diagnosis Date  . Allergy    Cipro, Plavix, Statins  . CAD (coronary artery disease)    a. s/p tandem Promus DES to LAD in 2009 by Dr. Olevia Perches;  b.  LHC (03/22/14):  prox LAD 30%, mid LAD 40%, LAD stent ok with dist 20% ISR, apical LAD occluded with L-L collats filling apical vessel (too small for  PCI), mid RCA 20%, EF 70%.  Med Rx  . Diabetes mellitus    Diagnosed 2012  . Diverticulosis of colon (without mention of hemorrhage)   . GERD (gastroesophageal reflux disease)   . Hx of cardiovascular stress test    a. Nuclear (08/2013):  No ischemia, EF 80%, Normal  . Hx of echocardiogram    a. Echo (07/2013):  Mild LVH, vigorous LVF, EF 65-70%, Gr 1 DD, mild MR, mild to mod LAE  . Hyperlipidemia    intol of statins  . Hypertension   . NHL (non-Hodgkin's lymphoma) ([facility]) dx'd 2003   chemo/xrt comp 2003  . Personal history of colonic polyps   . Proctitis   . Pruritic intertrigo 07/18/2014  . Urticaria      Social History   Socioeconomic History  . Marital status: Married    Spouse name: Not on file  . Number of children: Not on file  . Years of education: Not on file  . Highest education level: Not on file  Occupational History  . Occupation: Retired    Fish farm manager: RETIRED  Social Needs  . Financial resource strain: Not on file  . Food insecurity    Worry: Not on file    Inability: Not on file  . Transportation needs    Medical: Not on file    Non-medical: Not on file  Tobacco Use  . Smoking status: Never Smoker  . Smokeless  tobacco: Never Used  Substance and Sexual Activity  . Alcohol use: No  . Drug use: No  . Sexual activity: Not Currently  Lifestyle  . Physical activity    Days per week: Not on file    Minutes per session: Not on file  . Stress: Not on file  Relationships  . Social Herbalist on phone: Not on file    Gets together: Not on file    Attends religious service: Not on file    Active member of club or organization: Not on file    Attends meetings of clubs or organizations: Not on file    Relationship status: Not on file  . Intimate partner violence    Fear of current or ex partner: Not on file    Emotionally abused: Not on file    Physically abused: Not on file    Forced sexual activity: Not on file  Other Topics Concern  . Not on  file  Social History Narrative  . Not on file    Past Surgical History:  Procedure Laterality Date  . cardiac cath-neg    . CORONARY ANGIOPLASTY WITH STENT PLACEMENT     x 2  . dexa-neg    . KNEE SURGERY  10/2003   left  . laser surgery for cataracts-left    . LEFT HEART CATHETERIZATION WITH CORONARY ANGIOGRAM N/A 03/22/2014   Procedure: LEFT HEART CATHETERIZATION WITH CORONARY ANGIOGRAM;  Surgeon: Burnell Blanks, MD;  Location: [facility] CATH LAB;  Service: Cardiovascular;  Laterality: N/A;  . stress cardiolite    . VAGINAL HYSTERECTOMY     partial, fibroids, one ovary left    Family History  Problem Relation Age of Onset  . Cancer Mother        jaw  . Hypertension Father   . Heart attack Father   . Heart attack Sister   . Colon cancer Neg Hx     Allergies  Allergen Reactions  . Other Rash and Anaphylaxis  . Shellfish Allergy Anaphylaxis and Rash  . Cephalexin     REACTION: trash and swelling REACTION: trash and swelling  . Ciprofloxacin     REACTION: u/k  . Clopidogrel Bisulfate     REACTION: swelling, rash  . Codeine Phosphate     REACTION: unspecified  . Ezetimibe-Simvastatin     REACTION: myalgias REACTION: myalgias  . Hydrocod Polst-Cpm Polst Er     REACTION: u/k  . Lidocaine Hives    ONLY TO ADHESIVE LIDOCAINE PATCHES. NO PROBLEM WITH INJECTABLE LIDOCAINE.  Marland Kitchen Nitrofurantoin     REACTION: [place] REACTION: [place]  . Pregabalin     REACTION: felt bad REACTION: felt bad  . Propoxyphene N-Acetaminophen     REACTION: [place]  . Rofecoxib     unk unk   . Sulfamethoxazole     REACTION: unspecified  . Sulfonamide Derivatives     REACTION: unspecified  . Celecoxib Rash    REACTION: [place] REACTION: [place]  . Codeine Rash    REACTION: [place] REACTION: [place]  . Hydrocod Polst-Cpm Polst Er Rash  . Propoxyphene Rash  . Sulfa Antibiotics Rash    REACTION: unspecified REACTION: unspecified    Current Outpatient Medications on File Prior to Visit  Medication Sig Dispense  Refill  . amLODipine (NORVASC) 5 MG tablet Take 1 tablet (5 mg total) by mouth daily. For blood pressure. (Patient taking differently: Take 0.5 mg by mouth daily. For blood pressure.) 90 tablet 3  . aspirin (ASPIRIN  LOW DOSE) 81 MG tablet Take 81 mg by mouth daily.      . blood glucose meter kit and supplies Dispense based on patient and insurance preference. Use up to times times daily as directed. (FOR ICD-10 E10.9, E11.9). 1 each 0  . brimonidine-timolol (COMBIGAN) 0.2-0.5 % ophthalmic solution Place 1 drop into both eyes every 12 (twelve) hours.    . Cholecalciferol (VITAMIN D-3) 1000 UNITS CAPS Take 1 capsule by mouth daily.    . cyclobenzaprine (FLEXERIL) 5 MG tablet Take 1 tablet (5 mg total) by mouth 3 (three) times daily as needed for muscle spasms. 30 tablet 0  . ezetimibe (ZETIA) 10 MG tablet TAKE ONE TABLET BY MOUTH DAILY 90 tablet 2  . fexofenadine (ALLEGRA) 180 MG tablet Take 1 tablet (180 mg total) by mouth as needed for allergies or rhinitis. 90 tablet 0  . glipiZIDE (GLUCOTROL XL) 10 MG 24 hr tablet TAKE ONE TABLET BY MOUTH EVERY MORNING WITH BREAKFAST 90 tablet 1  . glucose blood (ONE TOUCH ULTRA TEST) test strip USE UP TO 4 TIMES DAILY TO CHECK BLOOD SUGAR AS DIRECTED 100 each 2  . isosorbide mononitrate (IMDUR) 60 MG 24 hr tablet TAKE ONE TABLET BY MOUTH DAILY 90 tablet 1  . metoprolol succinate (TOPROL-XL) 100 MG 24 hr tablet TAKE ONE TABLET BY MOUTH EVERY MORNING WITH OR IMMEDIATELY FOLLOWING A MEAL 90 tablet 3  . nitroGLYCERIN (NITROSTAT) 0.4 MG SL tablet Place 1 tablet (0.4 mg total) under the tongue every 5 (five) minutes as needed for chest pain. 26 tablet 0  . OVER THE COUNTER MEDICATION Take 1 tablet by mouth daily. Med name: VISION FORMULA    . pantoprazole (PROTONIX) 40 MG tablet Take 1 tablet (40 mg total) by mouth daily. For heartburn. 90 tablet 3  . sertraline (ZOLOFT) 25 MG tablet Take 1 tablet (25 mg total) by mouth daily. For stress. 90 tablet 3  . timolol  (TIMOPTIC) 0.5 % ophthalmic solution     . trimethoprim (TRIMPEX) 100 MG tablet Take 100 mg by mouth 2 (two) times daily as needed (UTI).     . vitamin B-12 (CYANOCOBALAMIN) 1000 MCG tablet Take 1,000 mcg by mouth daily.    . [DISCONTINUED] loratadine (CLARITIN) 10 MG tablet Take 10 mg by mouth as needed.      No current facility-administered medications on file prior to visit.     BP 122/60   Pulse 65   Temp 97.7 F (36.5 C) (Oral)   Ht 5' 8"  (1.727 m)   Wt 169 lb 8 oz (76.9 kg)   SpO2 98%   BMI 25.77 kg/m    Objective:   Physical Exam  Constitutional: She is oriented to person, place, and time. She appears well-nourished.  Respiratory: Effort normal.  Musculoskeletal: Normal range of motion.       Back:  Neurological: She is alert and oriented to person, place, and time.  Skin: Skin is warm and dry.           Assessment & Plan:

## 2019-08-16 NOTE — Addendum Note (Signed)
Addended by: Jacqualin Combes on: 08/16/2019 12:34 PM   Modules accepted: Orders

## 2019-08-16 NOTE — Assessment & Plan Note (Signed)
Doing well today on 2.5 mg of Amlodipine for which she has taken for the last three days.   Recommended she continue at 1/2 tablet for now until evaluated by her cardiologist. Of note, we removed her from losartan due to several lab results of hyperkalemia with normalization once losartan was removed.

## 2019-08-16 NOTE — Assessment & Plan Note (Addendum)
Symptoms and exam today representative of intermittent muscle spasms. No alarm signs noted. She is ambulatory in clinic without difficult.  Rx for Tizanidine sent to pharmacy. Discussed drowsiness precautions. Heat, Tylenol PRN.

## 2019-08-16 NOTE — Patient Instructions (Addendum)
You may take the tizanidine 4 mg every 8 hours as needed for muscle spasms. Caution as it may cause drowsiness.  Continue to take 1/2 tablet of the Amlodipine blood pressure medication for now. Discuss with your cardiologist that your potassium started increasing while on losartan.  It was a pleasure to see you today!

## 2019-08-27 NOTE — Progress Notes (Signed)
Cardiology Office Note    Date:  08/28/2019   ID:  [name] [name], DOB [birth year], MRN [id number]  PCP:  Pleas Koch, NP  Cardiologist: Lauree Chandler, MD EPS: None  Chief Complaint  Patient presents with  . Follow-up    History of Present Illness:  [name] [name] is a 85 y.o. female with history of CAD S/P tandem non-overlapping DES LAD2009, cath 02/2014 patent LAD stents occluded apical LAD segment with Left to left collaterals and non-obstructive disease elsewhere, HTN, HLD intolerant to statins and Plavix. NST 2017 no ischemia, echo 2017 normal LV size and function, trivial AI.  Last OV Dr. Angelena Form 08/04/18 and doing well.  Patient comes in for f/u. Denies chest pain, shortness of breath, dizziness or presyncope. PCP stopped losartan b/c of high potassium. Legs swollen on amlodipine so had to decrease to 2.5 mg daily. Stressed taking care of husband with Parkinson's. No regular exercise outside of taking care of husband.  Past Medical History:  Diagnosis Date  . Allergy    Cipro, Plavix, Statins  . CAD (coronary artery disease)    a. s/p tandem Promus DES to LAD in 2009 by Dr. Olevia Perches;  b.  LHC (03/22/14):  prox LAD 30%, mid LAD 40%, LAD stent ok with dist 20% ISR, apical LAD occluded with L-L collats filling apical vessel (too small for PCI), mid RCA 20%, EF 70%.  Med Rx  . Diabetes mellitus    Diagnosed 2012  . Diverticulosis of colon (without mention of hemorrhage)   . GERD (gastroesophageal reflux disease)   . Hx of cardiovascular stress test    a. Nuclear (08/2013):  No ischemia, EF 80%, Normal  . Hx of echocardiogram    a. Echo (07/2013):  Mild LVH, vigorous LVF, EF 65-70%, Gr 1 DD, mild MR, mild to mod LAE  . Hyperlipidemia    intol of statins  . Hypertension   . NHL (non-Hodgkin's lymphoma) ([place]) dx'd 2003   chemo/xrt comp 2003  . Personal history of colonic polyps   . Proctitis   . Pruritic intertrigo 07/18/2014  . Urticaria     Past Surgical History:   Procedure Laterality Date  . cardiac cath-neg    . CORONARY ANGIOPLASTY WITH STENT PLACEMENT     x 2  . dexa-neg    . KNEE SURGERY  10/2003   left  . laser surgery for cataracts-left    . LEFT HEART CATHETERIZATION WITH CORONARY ANGIOGRAM N/A 03/22/2014   Procedure: LEFT HEART CATHETERIZATION WITH CORONARY ANGIOGRAM;  Surgeon: Burnell Blanks, MD;  Location: [facility] CATH LAB;  Service: Cardiovascular;  Laterality: N/A;  . stress cardiolite    . VAGINAL HYSTERECTOMY     partial, fibroids, one ovary left    Current Medications: Current Meds  Medication Sig  . amLODipine (NORVASC) 5 MG tablet Take 1 tablet (5 mg total) by mouth daily. For blood pressure. (Patient taking differently: Take 2.5 mg by mouth daily. For blood pressure.take one half tablet by mouth daily.)  . aspirin (ASPIRIN LOW DOSE) 81 MG tablet Take 81 mg by mouth daily.    . blood glucose meter kit and supplies Dispense based on patient and insurance preference. Use up to times times daily as directed. (FOR ICD-10 E10.9, E11.9).  . brimonidine-timolol (COMBIGAN) 0.2-0.5 % ophthalmic solution Place 1 drop into both eyes every 12 (twelve) hours.  . Cholecalciferol (VITAMIN D-3) 1000 UNITS CAPS Take 1 capsule by mouth daily.  . cyclobenzaprine (FLEXERIL) 5 MG tablet  Take 1 tablet (5 mg total) by mouth 3 (three) times daily as needed for muscle spasms.  Marland Kitchen ezetimibe (ZETIA) 10 MG tablet TAKE ONE TABLET BY MOUTH DAILY  . fexofenadine (ALLEGRA) 180 MG tablet Take 1 tablet (180 mg total) by mouth as needed for allergies or rhinitis.  Marland Kitchen glipiZIDE (GLUCOTROL) 10 MG tablet Take 10 mg by mouth daily before breakfast.  . glucose blood (ONE TOUCH ULTRA TEST) test strip USE UP TO 4 TIMES DAILY TO CHECK BLOOD SUGAR AS DIRECTED  . isosorbide mononitrate (IMDUR) 60 MG 24 hr tablet TAKE ONE TABLET BY MOUTH DAILY  . metoprolol succinate (TOPROL-XL) 100 MG 24 hr tablet TAKE ONE TABLET BY MOUTH EVERY MORNING WITH OR IMMEDIATELY FOLLOWING A MEAL   . nitroGLYCERIN (NITROSTAT) 0.4 MG SL tablet Place 1 tablet (0.4 mg total) under the tongue every 5 (five) minutes as needed for chest pain.  Marland Kitchen OVER THE COUNTER MEDICATION Take 1 tablet by mouth daily. Med name: VISION FORMULA  . pantoprazole (PROTONIX) 40 MG tablet Take 1 tablet (40 mg total) by mouth daily. For heartburn.  . sertraline (ZOLOFT) 25 MG tablet Take 1 tablet (25 mg total) by mouth daily. For stress.  . timolol (TIMOPTIC) 0.5 % ophthalmic solution   . tiZANidine (ZANAFLEX) 4 MG tablet Take 1 tablet (4 mg total) by mouth every 8 (eight) hours as needed for muscle spasms.  Marland Kitchen trimethoprim (TRIMPEX) 100 MG tablet Take 100 mg by mouth 2 (two) times daily as needed (UTI).   . vitamin B-12 (CYANOCOBALAMIN) 1000 MCG tablet Take 1,000 mcg by mouth daily.     Allergies:   Other, Shellfish allergy, Cephalexin, Ciprofloxacin, Clopidogrel bisulfate, Codeine phosphate, Ezetimibe-simvastatin, Hydrocod polst-cpm polst er, Lidocaine, Nitrofurantoin, Pregabalin, Propoxyphene n-acetaminophen, Rofecoxib, Sulfamethoxazole, Sulfonamide derivatives, Celecoxib, Codeine, Hydrocod polst-cpm polst er, Propoxyphene, and Sulfa antibiotics   Social History   Socioeconomic History  . Marital status: Married    Spouse name: Not on file  . Number of children: Not on file  . Years of education: Not on file  . Highest education level: Not on file  Occupational History  . Occupation: Retired    Fish farm manager: RETIRED  Social Needs  . Financial resource strain: Not on file  . Food insecurity    Worry: Not on file    Inability: Not on file  . Transportation needs    Medical: Not on file    Non-medical: Not on file  Tobacco Use  . Smoking status: Never Smoker  . Smokeless tobacco: Never Used  Substance and Sexual Activity  . Alcohol use: No  . Drug use: No  . Sexual activity: Not Currently  Lifestyle  . Physical activity    Days per week: Not on file    Minutes per session: Not on file  . Stress: Not  on file  Relationships  . Social Herbalist on phone: Not on file    Gets together: Not on file    Attends religious service: Not on file    Active member of club or organization: Not on file    Attends meetings of clubs or organizations: Not on file    Relationship status: Not on file  Other Topics Concern  . Not on file  Social History Narrative  . Not on file     Family History:  The patient's   family history includes Cancer in her mother; Heart attack in her father and sister; Hypertension in her father.   ROS:  Please see the history of present illness.    ROS All other systems reviewed and are negative.   PHYSICAL EXAM:   VS:  BP 120/64   Pulse 60   Ht _0  (1.727 m)   Wt 169 lb 12.8 oz (77 kg)   SpO2 96%   BMI 25.82 kg/m   Physical Exam  GEN: Well nourished, well developed, in no acute distress  Neck: no JVD, carotid bruits, or masses Cardiac:RRR; 1/6 systolic murmur LSB Respiratory:  clear to auscultation bilaterally, normal work of breathing GI: soft, nontender, nondistended, + BS Ext: without cyanosis, clubbing, or edema, Good distal pulses bilaterally Neuro:  Alert and Oriented x 3 Psych: euthymic mood, full affect  Wt Readings from Last 3 Encounters:  08/28/19 169 lb 12.8 oz (77 kg)  08/16/19 169 lb 8 oz (76.9 kg)  07/13/19 168 lb 4 oz (76.3 kg)      Studies/Labs Reviewed:   EKG:  EKG is  ordered today.  The ekg ordered today demonstrates NSR with old inf infarct  Recent Labs: 05/29/2019: ALT 27 06/20/2019: BUN 25; Creatinine, Ser 1.25; Potassium 5.1; Sodium 138   Lipid Panel    Component Value Date/Time   CHOL 165 05/29/2019 0750   TRIG 181.0 (H) 05/29/2019 0750   HDL 44.50 05/29/2019 0750   CHOLHDL 4 05/29/2019 0750   VLDL 36.2 05/29/2019 0750   LDLCALC 84 05/29/2019 0750   LDLDIRECT 93.0 08/06/2015 0847    Additional studies/ records that were reviewed today include:   Echo 01/20/16: Left ventricle: The cavity size was  normal. Wall thickness was   increased in a pattern of mild LVH. There was mild focal basal   hypertrophy of the septum. Systolic function was normal. The   estimated ejection fraction was in the range of 60% to 65%. Wall   motion was normal; there were no regional wall motion   abnormalities. Doppler parameters are consistent with abnormal   left ventricular relaxation (grade 1 diastolic dysfunction).   Doppler parameters are consistent with high ventricular filling   pressure. - Aortic valve: There was trivial regurgitation. - Mitral valve: Calcified annulus.   Cardiac cath 03/22/14: Left main: No obstructive disease.   Left Anterior Descending Artery: Large caliber vessel that courses to the apex. 30% proximal stenosis. 40% mid stenosis just prior to the stented segment. Mid stented segment patent without restenosis. Distal stented segment patent with 20% restenosis. Apical LAD becomes very small in caliber and has total occlusion with filling of apical vessel by left to left collaterals. (0.75 mm vessel).   Circumflex Artery: Large caliber vessel with several small OM branches. Diffuse luminal irregularities in the mid vessel.   Right Coronary Artery: Large dominant vessel with 20% mid stenosis.  Left Ventricular Angiogram: LVEF=70%.        ASSESSMENT:    1. Atherosclerosis of native coronary artery of native heart without angina pectoris   2. Essential hypertension   3. HYPERCHOLESTEROLEMIA   4. Type 2 diabetes mellitus with hyperosmolarity without coma, without long-term current use of insulin (HCC)      PLAN:  In order of problems listed above:  CAD S/P tandem non-overlapping DES LAD 2009, cath 02/2014 patent LAD stents occluded apical LAD segment with Left to left collaterals and non-obstructive disease elsewhere,no angina. Doing well.  Essential HTN-losartan stopped because of elevated potassium per PCP now on low dose amlodipine and tolerating well. BP controlled   HLD-statin intolerant on Zetia LDL 84 in 04/2019  DM2 A1C 7.5 needs better DM control. Managed by PCP   Medication Adjustments/Labs and Tests Ordered: Current medicines are reviewed at length with the patient today.  Concerns regarding medicines are outlined above.  Medication changes, Labs and Tests ordered today are listed in the Patient Instructions below. Patient Instructions  Medication Instructions:  Your physician recommends that you continue on your current medications as directed. Please refer to the Current Medication list given to you today.  If you need a refill on your cardiac medications before your next appointment, please call your pharmacy.   Lab work: None Ordered  If you have labs (blood work) drawn today and your tests are completely normal, you will receive your results only by: Marland Kitchen MyChart Message (if you have MyChart) OR . A paper copy in the mail If you have any lab test that is abnormal or we need to change your treatment, we will call you to review the results.  Testing/Procedures: None ordered  Follow-Up: At [facility], you and your health needs are our priority.  As part of our continuing mission to provide you with exceptional heart care, we have created designated Provider Care Teams.  These Care Teams include your primary Cardiologist (physician) and Advanced Practice Providers (APPs -  Physician Assistants and Nurse Practitioners) who all work together to provide you with the care you need, when you need it. . You will need a follow up appointment in 1 year.  Please call our office 2 months in advance to schedule this appointment.  You may see Darlina Guys, MD or one of the following Advanced Practice Providers on your designated Care Team:   . Lyda Jester, PA-C . Dayna Dunn, PA-C . Ermalinda Barrios, PA-C  Any Other Special Instructions Will Be Listed Below (If Applicable).       Sumner Boast, PA-C  08/28/2019 9:58 AM    [facility] [address], [place], [place]  [address] Phone: [phone]; Fax: [phone]

## 2019-08-28 ENCOUNTER — Ambulatory Visit (INDEPENDENT_AMBULATORY_CARE_PROVIDER_SITE_OTHER): Payer: Medicare Other | Admitting: Physician Assistant

## 2019-08-28 ENCOUNTER — Encounter: Payer: Self-pay | Admitting: Physician Assistant

## 2019-08-28 ENCOUNTER — Other Ambulatory Visit: Payer: Self-pay

## 2019-08-28 VITALS — BP 120/64 | HR 60 | Ht 68.0 in | Wt 169.8 lb

## 2019-08-28 DIAGNOSIS — E1100 Type 2 diabetes mellitus with hyperosmolarity without nonketotic hyperglycemic-hyperosmolar coma (NKHHC): Secondary | ICD-10-CM | POA: Diagnosis not present

## 2019-08-28 DIAGNOSIS — E7800 Pure hypercholesterolemia, unspecified: Secondary | ICD-10-CM

## 2019-08-28 DIAGNOSIS — I10 Essential (primary) hypertension: Secondary | ICD-10-CM

## 2019-08-28 DIAGNOSIS — I2510 Atherosclerotic heart disease of native coronary artery without angina pectoris: Secondary | ICD-10-CM | POA: Diagnosis not present

## 2019-08-28 NOTE — Patient Instructions (Signed)
Medication Instructions:  Your physician recommends that you continue on your current medications as directed. Please refer to the Current Medication list given to you today.  If you need a refill on your cardiac medications before your next appointment, please call your pharmacy.   Lab work: None Ordered  If you have labs (blood work) drawn today and your tests are completely normal, you will receive your results only by: . MyChart Message (if you have MyChart) OR . A paper copy in the mail If you have any lab test that is abnormal or we need to change your treatment, we will call you to review the results.  Testing/Procedures: None ordered  Follow-Up: At [facility], you and your health needs are our priority.  As part of our continuing mission to provide you with exceptional heart care, we have created designated Provider Care Teams.  These Care Teams include your primary Cardiologist (physician) and Advanced Practice Providers (APPs -  Physician Assistants and Nurse Practitioners) who all work together to provide you with the care you need, when you need it. . You will need a follow up appointment in 1 year.  Please call our office 2 months in advance to schedule this appointment.  You may see Chris McAlhany, MD or one of the following Advanced Practice Providers on your designated Care Team:   . Brittainy Simmons, PA-C . Dayna Dunn, PA-C . Michele Lenze, PA-C  Any Other Special Instructions Will Be Listed Below (If Applicable).    

## 2019-09-29 ENCOUNTER — Other Ambulatory Visit: Payer: Self-pay | Admitting: Cardiovascular Disease

## 2019-10-17 ENCOUNTER — Ambulatory Visit (INDEPENDENT_AMBULATORY_CARE_PROVIDER_SITE_OTHER): Payer: Medicare Other | Admitting: Family Medicine

## 2019-10-17 ENCOUNTER — Encounter: Payer: Self-pay | Admitting: Family Medicine

## 2019-10-17 ENCOUNTER — Other Ambulatory Visit: Payer: Self-pay

## 2019-10-17 VITALS — BP 112/60 | HR 65 | Temp 98.0°F | Ht 68.0 in | Wt 171.8 lb

## 2019-10-17 DIAGNOSIS — M7701 Medial epicondylitis, right elbow: Secondary | ICD-10-CM

## 2019-10-17 DIAGNOSIS — M654 Radial styloid tenosynovitis [de Quervain]: Secondary | ICD-10-CM | POA: Diagnosis not present

## 2019-10-17 DIAGNOSIS — M1711 Unilateral primary osteoarthritis, right knee: Secondary | ICD-10-CM

## 2019-10-17 DIAGNOSIS — M7711 Lateral epicondylitis, right elbow: Secondary | ICD-10-CM

## 2019-10-17 DIAGNOSIS — E119 Type 2 diabetes mellitus without complications: Secondary | ICD-10-CM

## 2019-10-17 MED ORDER — PREDNISONE 20 MG PO TABS: ORAL_TABLET | ORAL | 0 refills | Status: DC

## 2019-10-17 MED ORDER — METHYLPREDNISOLONE ACETATE 40 MG/ML IJ SUSP
40.0000 mg | Freq: Once | INTRAMUSCULAR | Status: AC
  Administered 2019-10-17: 40 mg via INTRA_ARTICULAR

## 2019-10-17 NOTE — Progress Notes (Signed)
[name] [name] T. Yury Schaus, MD Primary Care and Sports Medicine [facility] at [facility] [place], [address] Phone: [phone]  FAX: [phone]  YOUNG BRIM - 85 y.o. female  MRN [id number]  Date of Birth: May 22, 1932  Visit Date: 10/17/2019  PCP: Pleas Koch, NP  Referred by: Pleas Koch, NP  Chief Complaint  Patient presents with  . Knee Pain    Right  . Elbow Pain    Right  . Fall    in August   Subjective:   ATHEA HALEY is a 85 y.o. very pleasant female patient with Body mass index is 26.11 kg/m. who presents with the following:  The patient presents, she has complaints of her right knee pain as well as right elbow pain and she also notes that she had a fall in August 2020.  R knee and R LE and ME.  She is the primary caregiver for her husband who is had some strokes as well as Parkinson's disease.  Fell at [place] at the dermatology surgery with husband.  At that point she struck her face and had a CT of her head.  She is also had follow-up films of her wrist and ankle  Today her primary complaint is her elbow at the lateral epicondylar region and with extension of the wrist.  She also has some pain with pronation to a much lower degree.  She also has pain in the right knee and has a mild effusion.  She also complains of some first dorsal compartment pain with movement of the thumb on the left side.  Lab Results  Component Value Date   HGBA1C 7.5 (H) 05/29/2019    R LE inj  Past Medical History, Surgical History, Social History, Family History, Problem List, Medications, and Allergies have been reviewed and updated if relevant.  Patient Active Problem List   Diagnosis Date Noted  . Acute bilateral thoracic back pain 08/16/2019  . Preventative health care 06/05/2019  . Shoulder pain 03/13/2019  . Tingling 06/29/2018  . Recurrent UTI 01/12/2018  . Neck pain 09/27/2017  . Insomnia 09/27/2017  .  Stress due to illness of family member 05/04/2016  . Pain in joints of both feet 01/14/2016  . Osteopenia 08/06/2015  . Constipation 12/30/2014  . B12 deficiency 12/30/2014  . Allergic reaction 09/02/2014  . Arthralgia of right foot 07/18/2014  . Right hip pain 06/04/2014  . Other malaise and fatigue 06/04/2014  . Elevated LFTs 04/19/2014  . Mitral regurgitation 04/19/2014  . Pes planus 06/13/2013  . Renal insufficiency 06/07/2011  . Type 2 diabetes mellitus ([place]) 05/31/2011  . MIXED HYPERLIPIDEMIA 08/04/2010  . LUMBAR SPRAIN AND STRAIN 01/15/2010  . 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 02/25/2009  . EXTERNAL HEMORRHOIDS 02/24/2009  . DIVERTICULOSIS, COLON 02/24/2009  . LYMPHOMA, MALT 08/28/2007  . DETACHED RETINA 08/28/2007  . Essential hypertension 08/28/2007  . Coronary atherosclerosis 08/28/2007  . MITRAL VALVE PROLAPSE 08/28/2007  . GERD 08/28/2007  . IBS 08/28/2007  . BREAST CYSTS, BILATERAL 08/28/2007    Past Medical History:  Diagnosis Date  . Allergy    Cipro, Plavix, Statins  . CAD (coronary artery disease)    a. s/p tandem Promus DES to LAD in 2009 by Dr. Olevia Perches;  b.  LHC (03/22/14):  prox LAD 30%, mid LAD 40%, LAD stent ok with dist 20% ISR, apical LAD occluded with L-L collats filling apical vessel (too small for PCI), mid  RCA 20%, EF 70%.  Med Rx  . Diabetes mellitus    Diagnosed 2012  . Diverticulosis of colon (without mention of hemorrhage)   . GERD (gastroesophageal reflux disease)   . Hx of cardiovascular stress test    a. Nuclear (08/2013):  No ischemia, EF 80%, Normal  . Hx of echocardiogram    a. Echo (07/2013):  Mild LVH, vigorous LVF, EF 65-70%, Gr 1 DD, mild MR, mild to mod LAE  . Hyperlipidemia    intol of statins  . Hypertension   . NHL (non-Hodgkin's lymphoma) ([place]) dx'd 2003   chemo/xrt comp 2003  . Personal history of colonic polyps   . Proctitis   . Pruritic intertrigo 07/18/2014  . Urticaria     Past Surgical History:   Procedure Laterality Date  . cardiac cath-neg    . CORONARY ANGIOPLASTY WITH STENT PLACEMENT     x 2  . dexa-neg    . KNEE SURGERY  10/2003   left  . laser surgery for cataracts-left    . LEFT HEART CATHETERIZATION WITH CORONARY ANGIOGRAM N/A 03/22/2014   Procedure: LEFT HEART CATHETERIZATION WITH CORONARY ANGIOGRAM;  Surgeon: Burnell Blanks, MD;  Location: [facility] CATH LAB;  Service: Cardiovascular;  Laterality: N/A;  . stress cardiolite    . VAGINAL HYSTERECTOMY     partial, fibroids, one ovary left    Social History   Socioeconomic History  . Marital status: Married    Spouse name: Not on file  . Number of children: Not on file  . Years of education: Not on file  . Highest education level: Not on file  Occupational History  . Occupation: Retired    Fish farm manager: RETIRED  Social Needs  . Financial resource strain: Not on file  . Food insecurity    Worry: Not on file    Inability: Not on file  . Transportation needs    Medical: Not on file    Non-medical: Not on file  Tobacco Use  . Smoking status: Never Smoker  . Smokeless tobacco: Never Used  Substance and Sexual Activity  . Alcohol use: No  . Drug use: No  . Sexual activity: Not Currently  Lifestyle  . Physical activity    Days per week: Not on file    Minutes per session: Not on file  . Stress: Not on file  Relationships  . Social Herbalist on phone: Not on file    Gets together: Not on file    Attends religious service: Not on file    Active member of club or organization: Not on file    Attends meetings of clubs or organizations: Not on file    Relationship status: Not on file  . Intimate partner violence    Fear of current or ex partner: Not on file    Emotionally abused: Not on file    Physically abused: Not on file    Forced sexual activity: Not on file  Other Topics Concern  . Not on file  Social History Narrative  . Not on file    Family History  Problem Relation Age of Onset  .  Cancer Mother        jaw  . Hypertension Father   . Heart attack Father   . Heart attack Sister   . Colon cancer Neg Hx     Allergies  Allergen Reactions  . Other Rash and Anaphylaxis  . Shellfish Allergy Anaphylaxis and Rash  . Cephalexin  REACTION: trash and swelling REACTION: trash and swelling  . Ciprofloxacin     REACTION: u/k  . Clopidogrel Bisulfate     REACTION: swelling, rash  . Codeine Phosphate     REACTION: unspecified  . Ezetimibe-Simvastatin     REACTION: myalgias REACTION: myalgias  . Hydrocod Polst-Cpm Polst Er     REACTION: u/k  . Lidocaine Hives    ONLY TO ADHESIVE LIDOCAINE PATCHES. NO PROBLEM WITH INJECTABLE LIDOCAINE.  Marland Kitchen Nitrofurantoin     REACTION: [place] REACTION: [place]  . Pregabalin     REACTION: felt bad REACTION: felt bad  . Propoxyphene N-Acetaminophen     REACTION: [place]  . Rofecoxib     unk unk   . Sulfamethoxazole     REACTION: unspecified  . Sulfonamide Derivatives     REACTION: unspecified  . Celecoxib Rash    REACTION: [place] REACTION: [place]  . Codeine Rash    REACTION: [place] REACTION: [place]  . Hydrocod Polst-Cpm Polst Er Rash  . Propoxyphene Rash  . Sulfa Antibiotics Rash    REACTION: unspecified REACTION: unspecified    Medication list reviewed and updated in full in [place].  GEN: No fevers, chills. Nontoxic. Primarily MSK c/o today. MSK: Detailed in the HPI GI: tolerating PO intake without difficulty Neuro: No numbness, parasthesias, or tingling associated. Otherwise the pertinent positives of the ROS are noted above.   Objective:   BP 112/60   Pulse 65   Temp 98 F (36.7 C) (Temporal)   Ht 5' 8"  (1.727 m)   Wt 171 lb 12 oz (77.9 kg)   SpO2 97%   BMI 26.11 kg/m    GEN: WDWN, NAD, Non-toxic, Alert & Oriented x 3 HEENT: Atraumatic, Normocephalic.  Ears and Nose: No external deformity. EXTR: No clubbing/cyanosis/edema NEURO: Normal gait.  PSYCH: Normally interactive. Conversant. Not depressed or anxious  appearing.  Calm demeanor.   Elbow: R Ecchymosis or edema: neg ROM: full flexion, extension, pronation, supination Shoulder ROM: Full Flexion: 5/5 Extension: 4/5, PAINFUL Supination: 5/5, PAINFUL Pronation: 5/5, mild pain Wrist ext: 5/5 Wrist flexion: 5/5 No gross bony abnormality Varus and Valgus stress: stable ECRB tenderness: YES, TTP Medial epicondyle: mild ttp Lateral epicondyle, resisted wrist extension from wrist full pronation and flexion: PAINFUL grip: 5/5  sensation intact Tinel's, Elbow: negative  Modest tenderness to palpation on the left first dorsal compartment and positive Finkelstein's.  Right knee with full extension and flexion to 105 degrees with a mild effusion.  Tender to palpate on medial joint line with the positive McMurray's for pain as well as a positive flexion pinch and no mechanical symptoms.    Radiology: No results found.  Assessment and Plan:     ICD-10-CM   1. Right tennis elbow  M77.11 methylPREDNISolone acetate (DEPO-MEDROL) injection 40 mg  2. Golfer's elbow, right  M77.01   3. Primary osteoarthritis of right knee  M17.11   4. De Quervain's tenosynovitis, left  M65.4   5. Controlled type 2 diabetes mellitus without complication, without long-term current use of insulin (HCC)  E11.9    Multijoint involvement as well as tendinopathy.  Her primary complaint today is some severe tennis elbow, some going to do a tennis elbow injection today.  Her chart notes a lot again allergy, but I have injected the patient personally multiple times for multiple joints with lidocaine and there is never been an issue.  Reviewed very basic range of motion and home program with the patient.  I am also can  have her some oral prednisone for 10 days, but she is not to do this until 7 days have passed secondary to her diabetes mellitus.  Follow-up: No follow-ups on file.  Meds ordered this encounter  Medications  . predniSONE (DELTASONE) 20 MG tablet     Sig: 2 tabs po daily for 5 days, then 1 tab po daily for 5 days    Dispense:  15 tablet    Refill:  0  . methylPREDNISolone acetate (DEPO-MEDROL) injection 40 mg   No orders of the defined types were placed in this encounter.   Signed,  Maud Deed. Rafeal Skibicki, MD   Outpatient Encounter Medications as of 10/17/2019  Medication Sig  . amLODipine (NORVASC) 5 MG tablet Take 1 tablet (5 mg total) by mouth daily. For blood pressure. (Patient taking differently: Take 2.5 mg by mouth daily. For blood pressure.take one half tablet by mouth daily.)  . aspirin (ASPIRIN LOW DOSE) 81 MG tablet Take 81 mg by mouth daily.    . blood glucose meter kit and supplies Dispense based on patient and insurance preference. Use up to times times daily as directed. (FOR ICD-10 E10.9, E11.9).  . brimonidine-timolol (COMBIGAN) 0.2-0.5 % ophthalmic solution Place 1 drop into both eyes every 12 (twelve) hours.  . Cholecalciferol (VITAMIN D-3) 1000 UNITS CAPS Take 1 capsule by mouth daily.  . cyclobenzaprine (FLEXERIL) 5 MG tablet Take 1 tablet (5 mg total) by mouth 3 (three) times daily as needed for muscle spasms.  Marland Kitchen ezetimibe (ZETIA) 10 MG tablet TAKE ONE TABLET BY MOUTH DAILY  . fexofenadine (ALLEGRA) 180 MG tablet Take 1 tablet (180 mg total) by mouth as needed for allergies or rhinitis.  Marland Kitchen glipiZIDE (GLUCOTROL) 10 MG tablet Take 10 mg by mouth daily before breakfast.  . glucose blood (ONE TOUCH ULTRA TEST) test strip USE UP TO 4 TIMES DAILY TO CHECK BLOOD SUGAR AS DIRECTED  . isosorbide mononitrate (IMDUR) 60 MG 24 hr tablet TAKE ONE TABLET BY MOUTH DAILY  . metoprolol succinate (TOPROL-XL) 100 MG 24 hr tablet TAKE ONE TABLET BY MOUTH EVERY MORNING WITH OR IMMEDIATELY FOLLOWING A MEAL  . nitroGLYCERIN (NITROSTAT) 0.4 MG SL tablet Place 1 tablet (0.4 mg total) under the tongue every 5 (five) minutes as needed for chest pain.  Marland Kitchen OVER THE COUNTER MEDICATION Take 1 tablet by mouth daily. Med name: VISION FORMULA  .  pantoprazole (PROTONIX) 40 MG tablet Take 1 tablet (40 mg total) by mouth daily. For heartburn.  . sertraline (ZOLOFT) 25 MG tablet Take 1 tablet (25 mg total) by mouth daily. For stress.  . timolol (TIMOPTIC) 0.5 % ophthalmic solution   . tiZANidine (ZANAFLEX) 4 MG tablet Take 1 tablet (4 mg total) by mouth every 8 (eight) hours as needed for muscle spasms.  Marland Kitchen trimethoprim (TRIMPEX) 100 MG tablet Take 100 mg by mouth 2 (two) times daily as needed (UTI).   . vitamin B-12 (CYANOCOBALAMIN) 1000 MCG tablet Take 1,000 mcg by mouth daily.  . predniSONE (DELTASONE) 20 MG tablet 2 tabs po daily for 5 days, then 1 tab po daily for 5 days  . [DISCONTINUED] loratadine (CLARITIN) 10 MG tablet Take 10 mg by mouth as needed.   . [EXPIRED] methylPREDNISolone acetate (DEPO-MEDROL) injection 40 mg    No facility-administered encounter medications on file as of 10/17/2019.

## 2019-11-01 ENCOUNTER — Other Ambulatory Visit: Payer: Self-pay | Admitting: Cardiovascular Disease

## 2019-11-01 ENCOUNTER — Other Ambulatory Visit: Payer: Self-pay | Admitting: Primary Care

## 2019-11-01 DIAGNOSIS — E0821 Diabetes mellitus due to underlying condition with diabetic nephropathy: Secondary | ICD-10-CM

## 2019-11-02 NOTE — Telephone Encounter (Signed)
Ok to refill? Last prescribed on 02/16/2019 . Last appointment on 08/16/2019. Next future appointment on 12/25/2019

## 2019-11-04 ENCOUNTER — Other Ambulatory Visit: Payer: Self-pay | Admitting: Cardiovascular Disease

## 2019-11-04 NOTE — Telephone Encounter (Signed)
Is she actually still taking this? It says that you discontinued in June 2020 and it doesn't match what she has on her medication list.

## 2019-11-06 NOTE — Telephone Encounter (Signed)
Patient is taking and refill as request. Error on my part, it was supposed to remove just the metformin.

## 2019-11-09 ENCOUNTER — Other Ambulatory Visit: Payer: Self-pay | Admitting: Primary Care

## 2019-11-21 ENCOUNTER — Ambulatory Visit: Payer: Medicare Other | Attending: Internal Medicine

## 2019-11-21 DIAGNOSIS — Z20822 Contact with and (suspected) exposure to covid-19: Secondary | ICD-10-CM

## 2019-11-23 LAB — NOVEL CORONAVIRUS, NAA: SARS-CoV-2, NAA: NOT DETECTED

## 2019-12-09 ENCOUNTER — Other Ambulatory Visit: Payer: Self-pay | Admitting: Primary Care

## 2019-12-09 DIAGNOSIS — Z6379 Other stressful life events affecting family and household: Secondary | ICD-10-CM

## 2019-12-25 ENCOUNTER — Encounter: Payer: Self-pay | Admitting: Primary Care

## 2019-12-25 ENCOUNTER — Ambulatory Visit: Payer: Medicare Other | Admitting: Primary Care

## 2019-12-25 ENCOUNTER — Other Ambulatory Visit: Payer: Self-pay

## 2019-12-25 VITALS — BP 122/74 | HR 67 | Temp 96.8°F | Ht 68.0 in | Wt 166.4 lb

## 2019-12-25 DIAGNOSIS — E119 Type 2 diabetes mellitus without complications: Secondary | ICD-10-CM

## 2019-12-25 DIAGNOSIS — E1100 Type 2 diabetes mellitus with hyperosmolarity without nonketotic hyperglycemic-hyperosmolar coma (NKHHC): Secondary | ICD-10-CM

## 2019-12-25 DIAGNOSIS — Z23 Encounter for immunization: Secondary | ICD-10-CM

## 2019-12-25 LAB — POCT GLYCOSYLATED HEMOGLOBIN (HGB A1C): Hemoglobin A1C: 7.6 % — AB (ref 4.0–5.6)

## 2019-12-25 NOTE — Addendum Note (Signed)
Addended by: Jacqualin Combes on: 12/25/2019 12:02 PM   Modules accepted: Orders

## 2019-12-25 NOTE — Progress Notes (Signed)
Subjective:    Patient ID: [name] [name], female    DOB: 01-21-1932, 86 y.o.   MRN: [id number]  HPI  This visit occurred during the SARS-CoV-2 public health emergency.  Safety protocols were in place, including screening questions prior to the visit, additional usage of staff PPE, and extensive cleaning of exam room while observing appropriate contact time as indicated for disinfecting solutions.   [name] [name] is a 86 year old female with a history of hypertension, mitral regurgitation, type 2 diabetes, renal insufficiency, recurrent UTI, hyperlipidemia, chronic shoulder/back/neck pain who presents today for follow up of diabetes.  Current medications include: Glipizide XL 10 mg. She cannot tolerate Metformin.   She is checking her blood glucose 1 times daily and is getting readings of:  AM fasting: 120's-170's  Last A1C: 7.5 in June 2020 Last Eye Exam: Due in August 2021 Last Foot Exam: Due Pneumonia Vaccination: Completed in September 2015 ACE/ARB: None. Urine microalbumin due Statin: None. Statin intolerant. LDL of 84 on Zetia.  BP Readings from Last 3 Encounters:  12/25/19 122/74  10/17/19 112/60  08/28/19 120/64     Review of Systems  Respiratory: Negative for shortness of breath.   Cardiovascular: Negative for chest pain.  Neurological: Negative for dizziness and headaches.       Past Medical History:  Diagnosis Date  . Allergy    Cipro, Plavix, Statins  . CAD (coronary artery disease)    a. s/p tandem Promus DES to LAD in 2009 by Dr. Olevia Perches;  b.  LHC (03/22/14):  prox LAD 30%, mid LAD 40%, LAD stent ok with dist 20% ISR, apical LAD occluded with L-L collats filling apical vessel (too small for PCI), mid RCA 20%, EF 70%.  Med Rx  . Diabetes mellitus    Diagnosed 2012  . Diverticulosis of colon (without mention of hemorrhage)   . GERD (gastroesophageal reflux disease)   . Hx of cardiovascular stress test    a. Nuclear (08/2013):  No ischemia, EF 80%, Normal  . Hx  of echocardiogram    a. Echo (07/2013):  Mild LVH, vigorous LVF, EF 65-70%, Gr 1 DD, mild MR, mild to mod LAE  . Hyperlipidemia    intol of statins  . Hypertension   . NHL (non-Hodgkin's lymphoma) ([place]) dx'd 2003   chemo/xrt comp 2003  . Personal history of colonic polyps   . Proctitis   . Pruritic intertrigo 07/18/2014  . Urticaria      Social History   Socioeconomic History  . Marital status: Married    Spouse name: Not on file  . Number of children: Not on file  . Years of education: Not on file  . Highest education level: Not on file  Occupational History  . Occupation: Retired    Fish farm manager: RETIRED  Tobacco Use  . Smoking status: Never Smoker  . Smokeless tobacco: Never Used  Substance and Sexual Activity  . Alcohol use: No  . Drug use: No  . Sexual activity: Not Currently  Other Topics Concern  . Not on file  Social History Narrative  . Not on file   Social Determinants of Health   Financial Resource Strain:   . Difficulty of Paying Living Expenses: Not on file  Food Insecurity:   . Worried About Charity fundraiser in the Last Year: Not on file  . Ran Out of Food in the Last Year: Not on file  Transportation Needs:   . Lack of Transportation (Medical): Not on file  .  Lack of Transportation (Non-Medical): Not on file  Physical Activity:   . Days of Exercise per Week: Not on file  . Minutes of Exercise per Session: Not on file  Stress:   . Feeling of Stress : Not on file  Social Connections:   . Frequency of Communication with Friends and Family: Not on file  . Frequency of Social Gatherings with Friends and Family: Not on file  . Attends Religious Services: Not on file  . Active Member of Clubs or Organizations: Not on file  . Attends Archivist Meetings: Not on file  . Marital Status: Not on file  Intimate Partner Violence:   . Fear of Current or Ex-Partner: Not on file  . Emotionally Abused: Not on file  . Physically Abused: Not on file  .  Sexually Abused: Not on file    Past Surgical History:  Procedure Laterality Date  . cardiac cath-neg    . CORONARY ANGIOPLASTY WITH STENT PLACEMENT     x 2  . dexa-neg    . KNEE SURGERY  10/2003   left  . laser surgery for cataracts-left    . LEFT HEART CATHETERIZATION WITH CORONARY ANGIOGRAM N/A 03/22/2014   Procedure: LEFT HEART CATHETERIZATION WITH CORONARY ANGIOGRAM;  Surgeon: Burnell Blanks, MD;  Location: [facility] CATH LAB;  Service: Cardiovascular;  Laterality: N/A;  . stress cardiolite    . VAGINAL HYSTERECTOMY     partial, fibroids, one ovary left    Family History  Problem Relation Age of Onset  . Cancer Mother        jaw  . Hypertension Father   . Heart attack Father   . Heart attack Sister   . Colon cancer Neg Hx     Allergies  Allergen Reactions  . Other Rash and Anaphylaxis  . Shellfish Allergy Anaphylaxis and Rash  . Cephalexin     REACTION: trash and swelling REACTION: trash and swelling  . Ciprofloxacin     REACTION: u/k  . Clopidogrel Bisulfate     REACTION: swelling, rash  . Codeine Phosphate     REACTION: unspecified  . Ezetimibe-Simvastatin     REACTION: myalgias REACTION: myalgias  . Hydrocod Polst-Cpm Polst Er     REACTION: u/k  . Lidocaine Hives    ONLY TO ADHESIVE LIDOCAINE PATCHES. NO PROBLEM WITH INJECTABLE LIDOCAINE.  Marland Kitchen Nitrofurantoin     REACTION: [place] REACTION: [place]  . Pregabalin     REACTION: felt bad REACTION: felt bad  . Propoxyphene N-Acetaminophen     REACTION: [place]  . Rofecoxib     unk unk   . Sulfamethoxazole     REACTION: unspecified  . Sulfonamide Derivatives     REACTION: unspecified  . Celecoxib Rash    REACTION: [place] REACTION: [place]  . Codeine Rash    REACTION: [place] REACTION: [place]  . Hydrocod Polst-Cpm Polst Er Rash  . Propoxyphene Rash  . Sulfa Antibiotics Rash    REACTION: unspecified REACTION: unspecified    Current Outpatient Medications on File Prior to Visit  Medication Sig Dispense Refill  . amLODipine  (NORVASC) 5 MG tablet Take 1 tablet (5 mg total) by mouth daily. For blood pressure. (Patient taking differently: Take 2.5 mg by mouth daily. For blood pressure.take one half tablet by mouth daily.) 90 tablet 3  . aspirin (ASPIRIN LOW DOSE) 81 MG tablet Take 81 mg by mouth daily.      . blood glucose meter kit and supplies Dispense based on patient and  insurance preference. Use up to times times daily as directed. (FOR ICD-10 E10.9, E11.9). 1 each 0  . brimonidine-timolol (COMBIGAN) 0.2-0.5 % ophthalmic solution Place 1 drop into both eyes every 12 (twelve) hours.    . Cholecalciferol (VITAMIN D-3) 1000 UNITS CAPS Take 1 capsule by mouth daily.    . cyclobenzaprine (FLEXERIL) 5 MG tablet Take 1 tablet (5 mg total) by mouth 3 (three) times daily as needed for muscle spasms. 30 tablet 0  . ezetimibe (ZETIA) 10 MG tablet TAKE ONE TABLET BY MOUTH DAILY 90 tablet 3  . fexofenadine (ALLEGRA) 180 MG tablet Take 1 tablet (180 mg total) by mouth as needed for allergies or rhinitis. 90 tablet 0  . glipiZIDE (GLUCOTROL XL) 10 MG 24 hr tablet TAKE ONE TABLET BY MOUTH EVERY MORNING WITH BREAKFAST 90 tablet 1  . isosorbide mononitrate (IMDUR) 60 MG 24 hr tablet TAKE ONE TABLET BY MOUTH DAILY 90 tablet 1  . metoprolol succinate (TOPROL-XL) 100 MG 24 hr tablet TAKE ONE TABLET BY MOUTH EVERY MORNING WITH OR IMMEDIATELY FOLLOWING A MEAL 90 tablet 2  . nitroGLYCERIN (NITROSTAT) 0.4 MG SL tablet Place 1 tablet (0.4 mg total) under the tongue every 5 (five) minutes as needed for chest pain. 26 tablet 0  . ONETOUCH ULTRA test strip USE UP TO 4 TIMES DAILY TO CHECK BLOOD SUGAR AS DIRECTED 100 strip 5  . OVER THE COUNTER MEDICATION Take 1 tablet by mouth daily. Med name: VISION FORMULA    . pantoprazole (PROTONIX) 40 MG tablet Take 1 tablet (40 mg total) by mouth daily. For heartburn. 90 tablet 3  . predniSONE (DELTASONE) 20 MG tablet 2 tabs po daily for 5 days, then 1 tab po daily for 5 days 15 tablet 0  . sertraline  (ZOLOFT) 25 MG tablet TAKE ONE TABLET BY MOUTH DAILY FOR STRESS 90 tablet 1  . timolol (TIMOPTIC) 0.5 % ophthalmic solution     . tiZANidine (ZANAFLEX) 4 MG tablet Take 1 tablet (4 mg total) by mouth every 8 (eight) hours as needed for muscle spasms. 30 tablet 0  . trimethoprim (TRIMPEX) 100 MG tablet Take 100 mg by mouth 2 (two) times daily as needed (UTI).     . vitamin B-12 (CYANOCOBALAMIN) 1000 MCG tablet Take 1,000 mcg by mouth daily.    . [DISCONTINUED] loratadine (CLARITIN) 10 MG tablet Take 10 mg by mouth as needed.      No current facility-administered medications on file prior to visit.    BP 122/74   Pulse 67   Temp (!) 96.8 F (36 C) (Temporal)   Ht 5' 8"  (1.727 m)   Wt 166 lb 7 oz (75.5 kg)   SpO2 97%   BMI 25.31 kg/m    Objective:   Physical Exam  Constitutional: She is oriented to person, place, and time. She appears well-nourished.  HENT:  Right Ear: Tympanic membrane and ear canal normal.  Left Ear: Tympanic membrane and ear canal normal.  Mouth/Throat: Oropharynx is clear and moist.  Eyes: Pupils are equal, round, and reactive to light. EOM are normal.  Cardiovascular: Normal rate and regular rhythm.  Respiratory: Effort normal and breath sounds normal.  GI: Soft. Bowel sounds are normal. There is no abdominal tenderness.  Musculoskeletal:        General: Normal range of motion.     Cervical back: Neck supple.  Neurological: She is alert and oriented to person, place, and time. No cranial nerve deficit.  Reflex Scores:  Patellar reflexes are 2+ on the right side and 2+ on the left side. Skin: Skin is warm and dry.  Psychiatric: She has a normal mood and affect.           Assessment & Plan:

## 2019-12-25 NOTE — Assessment & Plan Note (Signed)
A1C of 7.6 today, about the same as last visit. Given her age she is within a stable range. Continue Glipizide XL 10 mg.  Foot exam today.  Eye exam UTD. Pneumovax due today. Urine microalbumin pending. LDL at goal.  Follow up in 6 months.

## 2019-12-25 NOTE — Addendum Note (Signed)
Addended by: Cloyd Stagers on: 12/25/2019 12:48 PM   Modules accepted: Orders

## 2019-12-25 NOTE — Patient Instructions (Addendum)
Stop by the lab prior to leaving today. I will notify you of your results once received.   It is important that you improve your diet. Please limit carbohydrates in the form of white bread, rice, pasta, sweets, fast food, fried food, sugary drinks, etc. Increase your consumption of fresh fruits and vegetables, whole grains, lean protein.  Ensure you are consuming 64 ounces of water daily.  We will see you in July 2021 for your annual physical, please schedule.  It was a pleasure to see you today!

## 2019-12-27 ENCOUNTER — Other Ambulatory Visit (INDEPENDENT_AMBULATORY_CARE_PROVIDER_SITE_OTHER): Payer: Medicare PPO

## 2019-12-27 ENCOUNTER — Other Ambulatory Visit: Payer: Medicare PPO

## 2019-12-27 ENCOUNTER — Other Ambulatory Visit: Payer: Self-pay

## 2019-12-27 DIAGNOSIS — H401133 Primary open-angle glaucoma, bilateral, severe stage: Secondary | ICD-10-CM | POA: Diagnosis not present

## 2019-12-27 DIAGNOSIS — Z9842 Cataract extraction status, left eye: Secondary | ICD-10-CM | POA: Diagnosis not present

## 2019-12-27 DIAGNOSIS — E119 Type 2 diabetes mellitus without complications: Secondary | ICD-10-CM

## 2019-12-27 DIAGNOSIS — Z9841 Cataract extraction status, right eye: Secondary | ICD-10-CM | POA: Diagnosis not present

## 2019-12-27 DIAGNOSIS — H5213 Myopia, bilateral: Secondary | ICD-10-CM | POA: Diagnosis not present

## 2019-12-27 DIAGNOSIS — H59811 Chorioretinal scars after surgery for detachment, right eye: Secondary | ICD-10-CM | POA: Diagnosis not present

## 2019-12-27 LAB — MICROALBUMIN / CREATININE URINE RATIO
Creatinine,U: 423.2 mg/dL
Microalb Creat Ratio: 1 mg/g (ref 0.0–30.0)
Microalb, Ur: 4.3 mg/dL — ABNORMAL HIGH (ref 0.0–1.9)

## 2019-12-27 LAB — HM DIABETES EYE EXAM

## 2020-01-01 ENCOUNTER — Encounter: Payer: Self-pay | Admitting: Primary Care

## 2020-01-28 ENCOUNTER — Telehealth: Payer: Self-pay

## 2020-01-28 NOTE — Telephone Encounter (Signed)
Message left for patient to return my call.  

## 2020-01-28 NOTE — Telephone Encounter (Signed)
[facility] - Client TELEPHONE ADVICE RECORD [facility] Patient Name: [name] [name] Gender: Female DOB: 01-Mar-1932 Age: 86 Y 11 M 23 D Return Phone Number: [phone] (Primary), [phone] (Secondary) Address[place] [address] Client [place] Night - Client Client Site [place] Physician [name] - NP Contact Type Call Who Is Calling Patient / Member / Family / Caregiver Call Type Triage / Clinical Relationship To Patient Self Return Phone Number [phone] (Primary) Chief Complaint Prescription Refill or Medication Request (non symptomatic) Reason for Call Medication Question / Request Initial Comment Caller states she is needing test strips and states her insurance will not pay for the one that is at the pharmacy now. Caller is needing the doctor to send in and approve this script in order for her to fill this. Translation No Nurse Assessment Nurse: Eulas Post, RN, Anderson Malta Date/Time (Eastern Time): 01/25/2020 6:51:32 PM Confirm and document reason for call. If symptomatic, describe symptoms. ---Caller states that pharmacy told her that [facility] won't cover her One Touch Ultra test strip Rx. Requesting new Rx. No new or worsening symtoms. Has the patient had close contact with a person known or suspected to have the novel coronavirus illness OR traveled / lives in area with major community spread (including international travel) in the last 14 days from the onset of symptoms? * If Asymptomatic, screen for exposure and travel within the last 14 days. ---No Does the patient have any new or worsening symptoms? ---No Nurse: Eulas Post, RN, Anderson Malta Date/Time (Eastern Time): 01/25/2020 6:52:36 PM Please select the assessment type ---Refill Additional Documentation ---Requesting new rx for test strips. [employer] won't cover her current script One Touch Ultra Does the patient have  enough medication to last until the office opens? ---Yes Guidelines Guideline Title Affirmed Question Affirmed Notes Nurse Date/Time ([place] Time) Disp. Time Eilene Ghazi Time) Disposition Final User 01/25/2020 6:53:29 PM Clinical Call Yes Eulas Post, RN, Anderson Malta

## 2020-01-29 MED ORDER — ACCU-CHEK GUIDE VI STRP: ORAL_STRIP | 5 refills | Status: DC

## 2020-01-29 MED ORDER — BLOOD GLUCOSE METER KIT: PACK | 0 refills | Status: DC

## 2020-01-29 MED ORDER — ACCU-CHEK SOFTCLIX LANCETS MISC: 5 refills | Status: DC

## 2020-01-29 MED ORDER — BLOOD GLUCOSE MONITOR SYSTEM W/DEVICE KIT: PACK | 0 refills | Status: DC

## 2020-01-29 NOTE — Telephone Encounter (Signed)
Spoken to patient and notified that new meter kit have been sent to [employer] [facility] [employer] as requested.

## 2020-01-29 NOTE — Telephone Encounter (Signed)
Pt called back needing accu-check meter and test strips to  go with this. Her insurance will pay for this  Pt has [place] insurance now  [facility] [employer] in [place]  Pt has enough test strips for 3 days

## 2020-02-08 ENCOUNTER — Other Ambulatory Visit: Payer: Self-pay | Admitting: Cardiovascular Disease

## 2020-03-02 NOTE — Progress Notes (Signed)
Euclide Granito T. Zamyah Wiesman, MD Primary Care and Sports Medicine [facility] at [facility] [place], [address] Phone: [phone]  FAX: [phone]  [name] [name] - 86 y.o. female  MRN [id number]  Date of Birth: 04-15-1932  Visit Date: 03/03/2020  PCP: Pleas Koch, NP  Referred by: Pleas Koch, NP  Chief Complaint  Patient presents with  . Elbow Pain    Right    This visit occurred during the SARS-CoV-2 public health emergency.  Safety protocols were in place, including screening questions prior to the visit, additional usage of staff PPE, and extensive cleaning of exam room while observing appropriate contact time as indicated for disinfecting solutions.   Subjective:   [name] [name] is a 86 y.o. very pleasant female patient with Body mass index is 25.81 kg/m. who presents with the following:  She is a pleasant lady who I know well.  I saw her in November 2020 for some right-sided elbow pain.  At that time she had some lateral epicondylitis as well as medial epicondylitis.  At that time she was having multiple joints involved and flared up.  I did do a tennis elbow injection with some Depo-Medrol and also gave her some prednisone x10 days to start 1 week after that injection.  She presents today in follow-up of this condition.  She continues to have some recalcitrant tennis elbow, and he did get quite better after the injection, but she did have a return of symptoms about 3 weeks ago.  She has done some rehab, but has not done some of the typical things that I give patients.  She also has not tried any topical diclofenac.  Now R LE and hurts.  About the same.  And fell at [facility].      10/17/2019 Last OV with Owens Loffler, MD  The patient presents, she has complaints of her right knee pain as well as right elbow pain and she also notes that she had a fall in August 2020.   R knee and R LE and ME.  She is the  primary caregiver for her husband who is had some strokes as well as Parkinson's disease.   Fell at [place] at the dermatology surgery with husband.  At that point she struck her face and had a CT of her head.  She is also had follow-up films of her wrist and ankle   Today her primary complaint is her elbow at the lateral epicondylar region and with extension of the wrist.  She also has some pain with pronation to a much lower degree.   She also has pain in the right knee and has a mild effusion.   She also complains of some first dorsal compartment pain with movement of the thumb on the left side.        Lab Results  Component Value Date    HGBA1C 7.5 (H) 05/29/2019    R LE inj   Review of Systems is noted in the HPI, as appropriate   Objective:   BP 130/60   Pulse 62   Temp 98.2 F (36.8 C) (Temporal)   Ht 5' 8"  (1.727 m)   Wt 169 lb 12 oz (77 kg)   SpO2 97%   BMI 25.81 kg/m    GEN: No acute distress; alert,appropriate. PULM: Breathing comfortably in no respiratory distress PSYCH: Normally interactive.   Elbow: R Ecchymosis or edema: neg ROM: full flexion,  extension, pronation, supination Shoulder ROM: Full Flexion: 5/5 Extension: 4/5, PAINFUL Supination: 5/5, PAINFUL Pronation: 5/5 Wrist ext: 5/5 Wrist flexion: 5/5 No gross bony abnormality Varus and Valgus stress: stable ECRB tenderness: YES, TTP Medial epicondyle: NT Lateral epicondyle, resisted wrist extension from wrist full pronation and flexion: PAINFUL grip: 5/5  sensation intact Tinel's, Elbow: negative    Radiology: No results found.  Assessment and Plan:     ICD-10-CM   1. Right tennis elbow  M77.11    Reviewed rehab program in detail.  Start with isometrics and progress.  Patient Instructions  Diclofenac 1% gel, use 4 times a day  Ibuprofen 400 mg recommended three times a day. (Over the counter Motrin, Advil, or Generic Ibuprofen 200 mg tablets. 2 tablets by mouth 3 times a  day. For 2 weeks only  Home rehab    Follow-up: Follow-up 6 weeks   Signed,  Betzayda Braxton T. Pinky Ravan, MD   Outpatient Encounter Medications as of 03/03/2020  Medication Sig  . Accu-Chek Softclix Lancets lancets Use as instructed to test blood sugar daily E11.9  . amLODipine (NORVASC) 5 MG tablet Take 1 tablet (5 mg total) by mouth daily. For blood pressure. (Patient taking differently: Take 2.5 mg by mouth daily. For blood pressure.take one half tablet by mouth daily.)  . aspirin (ASPIRIN LOW DOSE) 81 MG tablet Take 81 mg by mouth daily.    . Blood Glucose Monitoring Suppl (BLOOD GLUCOSE MONITOR SYSTEM) w/Device KIT Use to test blood sugar up to 4 times daily Dx E11.9  . brimonidine-timolol (COMBIGAN) 0.2-0.5 % ophthalmic solution Place 1 drop into both eyes every 12 (twelve) hours.  . Cholecalciferol (VITAMIN D-3) 1000 UNITS CAPS Take 1 capsule by mouth daily.  . cyclobenzaprine (FLEXERIL) 5 MG tablet Take 1 tablet (5 mg total) by mouth 3 (three) times daily as needed for muscle spasms.  Marland Kitchen ezetimibe (ZETIA) 10 MG tablet TAKE ONE TABLET BY MOUTH DAILY  . fexofenadine (ALLEGRA) 180 MG tablet Take 1 tablet (180 mg total) by mouth as needed for allergies or rhinitis.  Marland Kitchen glipiZIDE (GLUCOTROL XL) 10 MG 24 hr tablet TAKE ONE TABLET BY MOUTH EVERY MORNING WITH BREAKFAST  . glucose blood (ACCU-CHEK GUIDE) test strip Use as instructed to test blood sugar up to 4 times daily Dx E11.9  . isosorbide mononitrate (IMDUR) 60 MG 24 hr tablet TAKE ONE TABLET BY MOUTH DAILY  . metoprolol succinate (TOPROL-XL) 100 MG 24 hr tablet TAKE ONE TABLET BY MOUTH EVERY MORNING WITH OR IMMEDIATELY FOLLOWING A MEAL  . nitroGLYCERIN (NITROSTAT) 0.4 MG SL tablet Place 1 tablet (0.4 mg total) under the tongue every 5 (five) minutes as needed for chest pain.  Marland Kitchen OVER THE COUNTER MEDICATION Take 1 tablet by mouth daily. Med name: VISION FORMULA  . pantoprazole (PROTONIX) 40 MG tablet Take 1 tablet (40 mg total) by mouth daily.  For heartburn.  . sertraline (ZOLOFT) 25 MG tablet TAKE ONE TABLET BY MOUTH DAILY FOR STRESS  . timolol (TIMOPTIC) 0.5 % ophthalmic solution   . tiZANidine (ZANAFLEX) 4 MG tablet Take 1 tablet (4 mg total) by mouth every 8 (eight) hours as needed for muscle spasms.  Marland Kitchen trimethoprim (TRIMPEX) 100 MG tablet Take 100 mg by mouth 2 (two) times daily as needed (UTI).   . vitamin B-12 (CYANOCOBALAMIN) 1000 MCG tablet Take 1,000 mcg by mouth daily.  . [DISCONTINUED] predniSONE (DELTASONE) 20 MG tablet 2 tabs po daily for 5 days, then 1 tab po daily for 5 days  . [  DISCONTINUED] loratadine (CLARITIN) 10 MG tablet Take 10 mg by mouth as needed.    No facility-administered encounter medications on file as of 03/03/2020.

## 2020-03-03 ENCOUNTER — Other Ambulatory Visit: Payer: Self-pay

## 2020-03-03 ENCOUNTER — Ambulatory Visit: Payer: Medicare PPO | Admitting: Family Medicine

## 2020-03-03 ENCOUNTER — Encounter: Payer: Self-pay | Admitting: Family Medicine

## 2020-03-03 VITALS — BP 130/60 | HR 62 | Temp 98.2°F | Ht 68.0 in | Wt 169.8 lb

## 2020-03-03 DIAGNOSIS — M7711 Lateral epicondylitis, right elbow: Secondary | ICD-10-CM | POA: Diagnosis not present

## 2020-03-03 NOTE — Patient Instructions (Addendum)
Diclofenac 1% gel, use 4 times a day  Ibuprofen 400 mg recommended three times a day. (Over the counter Motrin, Advil, or Generic Ibuprofen 200 mg tablets. 2 tablets by mouth 3 times a day. For 2 weeks only  Home rehab

## 2020-03-26 ENCOUNTER — Ambulatory Visit: Payer: Medicare PPO | Admitting: Primary Care

## 2020-03-26 ENCOUNTER — Other Ambulatory Visit: Payer: Self-pay

## 2020-03-26 ENCOUNTER — Encounter: Payer: Self-pay | Admitting: Primary Care

## 2020-03-26 VITALS — BP 120/74 | HR 80 | Temp 96.2°F | Ht 68.0 in | Wt 170.0 lb

## 2020-03-26 DIAGNOSIS — R600 Localized edema: Secondary | ICD-10-CM

## 2020-03-26 DIAGNOSIS — I10 Essential (primary) hypertension: Secondary | ICD-10-CM | POA: Diagnosis not present

## 2020-03-26 LAB — BASIC METABOLIC PANEL
BUN: 16 mg/dL (ref 6–23)
CO2: 31 mEq/L (ref 19–32)
Calcium: 9.1 mg/dL (ref 8.4–10.5)
Chloride: 101 mEq/L (ref 96–112)
Creatinine, Ser: 1.16 mg/dL (ref 0.40–1.20)
GFR: 44.28 mL/min — ABNORMAL LOW (ref >60.00)
Glucose, Bld: 230 mg/dL — ABNORMAL HIGH (ref 70–99)
Potassium: 4.8 mEq/L (ref 3.5–5.1)
Sodium: 138 mEq/L (ref 135–145)

## 2020-03-26 LAB — HEMOGLOBIN A1C: Hgb A1c MFr Bld: 7.3 % — ABNORMAL HIGH (ref 4.6–6.5)

## 2020-03-26 LAB — URIC ACID: Uric Acid, Serum: 4.7 mg/dL (ref 2.4–7.0)

## 2020-03-26 LAB — BRAIN NATRIURETIC PEPTIDE: Pro B Natriuretic peptide (BNP): 282 pg/mL — ABNORMAL HIGH (ref 0.0–100.0)

## 2020-03-26 NOTE — Assessment & Plan Note (Signed)
Well controlled today. Stop amlodipine 2.5 mg given ankle edema. She will monitor her blood pressure at home.

## 2020-03-26 NOTE — Progress Notes (Signed)
Subjective:    Patient ID: [name] [name], female    DOB: 1932-09-26, 86 y.o.   MRN: [id number]  HPI  This visit occurred during the SARS-CoV-2 public health emergency.  Safety protocols were in place, including screening questions prior to the visit, additional usage of staff PPE, and extensive cleaning of exam room while observing appropriate contact time as indicated for disinfecting solutions.   [name] [name] is a 86 year old female with a history of hypertension, mitral regurgitation, coronary atherosclerosis, lower extremity edema, renal insufficiency, hyperlipidemia, chronic fatigue, arthralgias, type 2 diabetes who presents today with a chief complaint of lower extremity edema.  She is currently managed on amlodipine 2.5 mg, reduction from 5 mg due to swollen ankles. Previously managed on losartan-HCTZ but this was discontinued due to hyperkalemia.   BP Readings from Last 3 Encounters:  03/26/20 120/74  03/03/20 130/60  12/25/19 122/74   Her swelling is improved in the morning when she wakes, increased during the day and at night. She does check her BP on occasion which runs "normal".   She is up and down all day long caring for her husband. She does not elevate her legs with resting much as she is up and down with her husband. She does not wear compression stockings or socks.   Review of Systems  Respiratory: Negative for shortness of breath.   Cardiovascular: Positive for leg swelling. Negative for chest pain.  Skin: Negative for color change.  Neurological: Negative for dizziness.       Past Medical History:  Diagnosis Date  . Allergy    Cipro, Plavix, Statins  . CAD (coronary artery disease)    a. s/p tandem Promus DES to LAD in 2009 by Dr. Olevia Perches;  b.  LHC (03/22/14):  prox LAD 30%, mid LAD 40%, LAD stent ok with dist 20% ISR, apical LAD occluded with L-L collats filling apical vessel (too small for PCI), mid RCA 20%, EF 70%.  Med Rx  . Diabetes mellitus    Diagnosed 2012   . Diverticulosis of colon (without mention of hemorrhage)   . GERD (gastroesophageal reflux disease)   . Hx of cardiovascular stress test    a. Nuclear (08/2013):  No ischemia, EF 80%, Normal  . Hx of echocardiogram    a. Echo (07/2013):  Mild LVH, vigorous LVF, EF 65-70%, Gr 1 DD, mild MR, mild to mod LAE  . Hyperlipidemia    intol of statins  . Hypertension   . NHL (non-Hodgkin's lymphoma) ([place]) dx'd 2003   chemo/xrt comp 2003  . Personal history of colonic polyps   . Proctitis   . Pruritic intertrigo 07/18/2014  . Urticaria      Social History   Socioeconomic History  . Marital status: Married    Spouse name: Not on file  . Number of children: Not on file  . Years of education: Not on file  . Highest education level: Not on file  Occupational History  . Occupation: Retired    Fish farm manager: RETIRED  Tobacco Use  . Smoking status: Never Smoker  . Smokeless tobacco: Never Used  Substance and Sexual Activity  . Alcohol use: No  . Drug use: No  . Sexual activity: Not Currently  Other Topics Concern  . Not on file  Social History Narrative  . Not on file   Social Determinants of Health   Financial Resource Strain:   . Difficulty of Paying Living Expenses:   Food Insecurity:   . Worried  About Running Out of Food in the Last Year:   . [place] in the Last Year:   Transportation Needs:   . Lack of Transportation (Medical):   Marland Kitchen Lack of Transportation (Non-Medical):   Physical Activity:   . Days of Exercise per Week:   . Minutes of Exercise per Session:   Stress:   . Feeling of Stress :   Social Connections:   . Frequency of Communication with Friends and Family:   . Frequency of Social Gatherings with Friends and Family:   . Attends Religious Services:   . Active Member of Clubs or Organizations:   . Attends Archivist Meetings:   Marland Kitchen Marital Status:   Intimate Partner Violence:   . Fear of Current or Ex-Partner:   . Emotionally Abused:   Marland Kitchen  Physically Abused:   . Sexually Abused:     Past Surgical History:  Procedure Laterality Date  . cardiac cath-neg    . CORONARY ANGIOPLASTY WITH STENT PLACEMENT     x 2  . dexa-neg    . KNEE SURGERY  10/2003   left  . laser surgery for cataracts-left    . LEFT HEART CATHETERIZATION WITH CORONARY ANGIOGRAM N/A 03/22/2014   Procedure: LEFT HEART CATHETERIZATION WITH CORONARY ANGIOGRAM;  Surgeon: Burnell Blanks, MD;  Location: [facility] CATH LAB;  Service: Cardiovascular;  Laterality: N/A;  . stress cardiolite    . VAGINAL HYSTERECTOMY     partial, fibroids, one ovary left    Family History  Problem Relation Age of Onset  . Cancer Mother        jaw  . Hypertension Father   . Heart attack Father   . Heart attack Sister   . Colon cancer Neg Hx     Allergies  Allergen Reactions  . Other Rash and Anaphylaxis  . Shellfish Allergy Anaphylaxis and Rash  . Cephalexin     REACTION: trash and swelling REACTION: trash and swelling  . Ciprofloxacin     REACTION: u/k  . Clopidogrel Bisulfate     REACTION: swelling, rash  . Codeine Phosphate     REACTION: unspecified  . Ezetimibe-Simvastatin     REACTION: myalgias REACTION: myalgias  . Hydrocod Polst-Cpm Polst Er     REACTION: u/k  . Lidocaine Hives    ONLY TO ADHESIVE LIDOCAINE PATCHES. NO PROBLEM WITH INJECTABLE LIDOCAINE.  Marland Kitchen Nitrofurantoin     REACTION: [place] REACTION: [place]  . Pregabalin     REACTION: felt bad REACTION: felt bad  . Propoxyphene N-Acetaminophen     REACTION: [place]  . Rofecoxib     unk unk   . Sulfamethoxazole     REACTION: unspecified  . Sulfonamide Derivatives     REACTION: unspecified  . Celecoxib Rash    REACTION: [place] REACTION: [place]  . Codeine Rash    REACTION: [place] REACTION: [place]  . Hydrocod Polst-Cpm Polst Er Rash  . Propoxyphene Rash  . Sulfa Antibiotics Rash    REACTION: unspecified REACTION: unspecified    Current Outpatient Medications on File Prior to Visit  Medication Sig Dispense Refill    . Accu-Chek Softclix Lancets lancets Use as instructed to test blood sugar daily E11.9 100 each 5  . amLODipine (NORVASC) 5 MG tablet Take 1 tablet (5 mg total) by mouth daily. For blood pressure. (Patient taking differently: Take 2.5 mg by mouth daily. For blood pressure.take one half tablet by mouth daily.) 90 tablet 3  . aspirin (ASPIRIN LOW DOSE) 81  MG tablet Take 81 mg by mouth daily.      . Blood Glucose Monitoring Suppl (BLOOD GLUCOSE MONITOR SYSTEM) w/Device KIT Use to test blood sugar up to 4 times daily Dx E11.9 1 kit 0  . brimonidine-timolol (COMBIGAN) 0.2-0.5 % ophthalmic solution Place 1 drop into both eyes every 12 (twelve) hours.    . Cholecalciferol (VITAMIN D-3) 1000 UNITS CAPS Take 1 capsule by mouth daily.    . cyclobenzaprine (FLEXERIL) 5 MG tablet Take 1 tablet (5 mg total) by mouth 3 (three) times daily as needed for muscle spasms. 30 tablet 0  . ezetimibe (ZETIA) 10 MG tablet TAKE ONE TABLET BY MOUTH DAILY 90 tablet 3  . fexofenadine (ALLEGRA) 180 MG tablet Take 1 tablet (180 mg total) by mouth as needed for allergies or rhinitis. 90 tablet 0  . glipiZIDE (GLUCOTROL XL) 10 MG 24 hr tablet TAKE ONE TABLET BY MOUTH EVERY MORNING WITH BREAKFAST 90 tablet 1  . glucose blood (ACCU-CHEK GUIDE) test strip Use as instructed to test blood sugar up to 4 times daily Dx E11.9 100 each 5  . isosorbide mononitrate (IMDUR) 60 MG 24 hr tablet TAKE ONE TABLET BY MOUTH DAILY 90 tablet 1  . metoprolol succinate (TOPROL-XL) 100 MG 24 hr tablet TAKE ONE TABLET BY MOUTH EVERY MORNING WITH OR IMMEDIATELY FOLLOWING A MEAL 90 tablet 2  . nitroGLYCERIN (NITROSTAT) 0.4 MG SL tablet Place 1 tablet (0.4 mg total) under the tongue every 5 (five) minutes as needed for chest pain. 26 tablet 0  . OVER THE COUNTER MEDICATION Take 1 tablet by mouth daily. Med name: VISION FORMULA    . pantoprazole (PROTONIX) 40 MG tablet Take 1 tablet (40 mg total) by mouth daily. For heartburn. 90 tablet 3  . sertraline  (ZOLOFT) 25 MG tablet TAKE ONE TABLET BY MOUTH DAILY FOR STRESS 90 tablet 1  . timolol (TIMOPTIC) 0.5 % ophthalmic solution     . tiZANidine (ZANAFLEX) 4 MG tablet Take 1 tablet (4 mg total) by mouth every 8 (eight) hours as needed for muscle spasms. 30 tablet 0  . trimethoprim (TRIMPEX) 100 MG tablet Take 100 mg by mouth 2 (two) times daily as needed (UTI).     . vitamin B-12 (CYANOCOBALAMIN) 1000 MCG tablet Take 1,000 mcg by mouth daily.    . [DISCONTINUED] loratadine (CLARITIN) 10 MG tablet Take 10 mg by mouth as needed.      No current facility-administered medications on file prior to visit.    BP 120/74   Pulse 80   Temp (!) 96.2 F (35.7 C) (Temporal)   Ht 5' 8"  (1.727 m)   Wt 170 lb (77.1 kg)   SpO2 95%   BMI 25.85 kg/m    Objective:   Physical Exam  Constitutional: She appears well-nourished.  Cardiovascular: Normal rate and regular rhythm.  Pulses:      Dorsalis pedis pulses are 1+ on the right side and 1+ on the left side.       Posterior tibial pulses are 1+ on the right side and 1+ on the left side.  Mild ankle edema bilaterally noted today. She does have a picture with her from a few evenings ago with moderate ankle edema.   Respiratory: Effort normal and breath sounds normal.  Musculoskeletal:     Cervical back: Neck supple.  Skin: Skin is warm and dry. No erythema.           Assessment & Plan:

## 2020-03-26 NOTE — Patient Instructions (Addendum)
Stop by the lab prior to leaving today. I will notify you of your results once received.   Elevated your legs when resting.  Wear compression stockings/socks when awake. Take them off when you are sleeping.  Stop taking amlodipine for blood pressure. Monitor your blood pressure and notify me if you see readings at or above 140/90.  Please update me in 2 weeks.  It was a pleasure to see you today!

## 2020-03-26 NOTE — Assessment & Plan Note (Signed)
Noted on exam today.  Likely venous insufficiency, but will rule out other causes.  Check labs including BMP, BNP, A1C. Stop amlodipine completely. Elevate legs when resting. Compression stockings.  She will update in 2 weeks.

## 2020-03-28 ENCOUNTER — Other Ambulatory Visit: Payer: Self-pay | Admitting: Primary Care

## 2020-03-28 ENCOUNTER — Telehealth: Payer: Self-pay | Admitting: Primary Care

## 2020-03-28 DIAGNOSIS — R600 Localized edema: Secondary | ICD-10-CM

## 2020-03-28 MED ORDER — FUROSEMIDE 20 MG PO TABS: 20.0000 mg | ORAL_TABLET | Freq: Every day | ORAL | 0 refills | Status: DC

## 2020-03-28 NOTE — Telephone Encounter (Signed)
Pt calling to discuss recent labs

## 2020-03-28 NOTE — Telephone Encounter (Signed)
Spoken and notified patient of [name] [name] comments.   Patient stated that she thought that I have called her this morning. I have inform patient that her cardiologist office tried to call this morning. Inform patient to call them back.

## 2020-04-01 ENCOUNTER — Telehealth: Payer: Self-pay

## 2020-04-01 DIAGNOSIS — F419 Anxiety disorder, unspecified: Secondary | ICD-10-CM | POA: Diagnosis not present

## 2020-04-01 DIAGNOSIS — I25119 Atherosclerotic heart disease of native coronary artery with unspecified angina pectoris: Secondary | ICD-10-CM | POA: Diagnosis not present

## 2020-04-01 DIAGNOSIS — Z882 Allergy status to sulfonamides status: Secondary | ICD-10-CM | POA: Diagnosis not present

## 2020-04-01 DIAGNOSIS — E1165 Type 2 diabetes mellitus with hyperglycemia: Secondary | ICD-10-CM | POA: Diagnosis not present

## 2020-04-01 DIAGNOSIS — E785 Hyperlipidemia, unspecified: Secondary | ICD-10-CM | POA: Diagnosis not present

## 2020-04-01 DIAGNOSIS — I10 Essential (primary) hypertension: Secondary | ICD-10-CM | POA: Diagnosis not present

## 2020-04-01 DIAGNOSIS — Z91013 Allergy to seafood: Secondary | ICD-10-CM | POA: Diagnosis not present

## 2020-04-01 DIAGNOSIS — H409 Unspecified glaucoma: Secondary | ICD-10-CM | POA: Diagnosis not present

## 2020-04-01 DIAGNOSIS — Z7984 Long term (current) use of oral hypoglycemic drugs: Secondary | ICD-10-CM | POA: Diagnosis not present

## 2020-04-01 DIAGNOSIS — K219 Gastro-esophageal reflux disease without esophagitis: Secondary | ICD-10-CM | POA: Diagnosis not present

## 2020-04-01 DIAGNOSIS — Z809 Family history of malignant neoplasm, unspecified: Secondary | ICD-10-CM | POA: Diagnosis not present

## 2020-04-01 NOTE — Telephone Encounter (Signed)
[name] [name] - Client Nonclinical Telephone Record [facility] Client [name] [name] - Client Client Site [name] [name] Physician [name] [name] - NP Contact Type Call Who Is Calling Patient / Member / Family / Caregiver Caller Name [name] Phone Number [phone] Call Type Message Only Information Provided Reason for Call Returning a Call from the Office Initial Comment Returning call to NP [name] asst. Additional Comment Disp. Time Disposition Final User 04/01/2020 7:50:50 AM General Information Provided Yes [name] Call Closed By: [name] Transaction Date/Time: 04/01/2020 7:49:05 AM (ET)

## 2020-04-01 NOTE — Telephone Encounter (Signed)
Addressed in result note.  

## 2020-04-03 ENCOUNTER — Encounter: Payer: Self-pay | Admitting: Family Medicine

## 2020-04-03 ENCOUNTER — Other Ambulatory Visit: Payer: Self-pay

## 2020-04-03 ENCOUNTER — Ambulatory Visit: Payer: Medicare PPO | Admitting: Family Medicine

## 2020-04-03 VITALS — BP 124/60 | HR 68 | Temp 97.7°F | Ht 68.0 in | Wt 169.0 lb

## 2020-04-03 DIAGNOSIS — M7711 Lateral epicondylitis, right elbow: Secondary | ICD-10-CM

## 2020-04-03 NOTE — Progress Notes (Signed)
Eurika Sandy T. Vaanya Shambaugh, MD, [place]  [facility] and [facility] at [facility] [place], [address]  Phone: [phone]  FAX: [phone]  [name] [name] - 86 y.o. female  MRN [id number]  Date of Birth: 1932-04-22  Date: 04/03/2020  PCP: Pleas Koch, NP  Referral: Pleas Koch, NP  Chief Complaint  Patient presents with  . Elbow Pain    Right    This visit occurred during the SARS-CoV-2 public health emergency.  Safety protocols were in place, including screening questions prior to the visit, additional usage of staff PPE, and extensive cleaning of exam room while observing appropriate contact time as indicated for disinfecting solutions.   Subjective:   [name] [name] is a 86 y.o. very pleasant female patient with Body mass index is 25.7 kg/m. who presents with the following:  Recalcitrant R sided tennis elbow: DOS 09/2019 We have tried multiple treatment modalities in the past.  This includes anti-inflammatories, oral steroids, and injection of the lateral epicondyle.  She has been quite diligent about her home rehab, and she understands this quite well.  Unfortunately, her symptoms persist now greater than 6 months.  She has primary care responsibilities of her husband who is not doing well from a physical standpoint.  S/p injection..  Very compliant with HEP.  Review of Systems is noted in the HPI, as appropriate   Objective:   BP 124/60   Pulse 68   Temp 97.7 F (36.5 C) (Temporal)   Ht _0  (1.727 m)   Wt 169 lb (76.7 kg)   SpO2 96%   BMI 25.70 kg/m    GEN: No acute distress; alert,appropriate. PULM: Breathing comfortably in no respiratory distress PSYCH: Normally interactive.   Elbow: R Ecchymosis or edema: neg ROM: full flexion, extension, pronation, supination Shoulder ROM: Full Flexion: 5/5 Extension: 5/5, PAINFUL Supination: 5/5, PAINFUL Pronation: 5/5 Wrist ext:  5/5 Wrist flexion: 5/5 No gross bony abnormality Varus and Valgus stress: stable ECRB tenderness: YES, TTP Medial epicondyle: NT Lateral epicondyle, resisted wrist extension from wrist full pronation and flexion: PAINFUL grip: 5/5  sensation intact Tinel's, Elbow: negative    Radiology: No results found.  Assessment and Plan:     ICD-10-CM   1. Right tennis elbow  M77.11    Challenging case of recalcitrant tennis elbow.  Placed the patient in an Aircast brace today.  Ideally I would have liked her to go to physical therapy, but due to her family circumstances with her inability to leave her husband that some ongoing to be possible.  Nitroglycerin would also be viable option, but she takes Imdur daily as well as had some nitroglycerin sublingual tablets.  I am not comfortable doing that in his case.  She is going to work through this the best that she can with continuing her home rehab, and her tennis elbow strap  Follow-up: No follow-ups on file.  No orders of the defined types were placed in this encounter.  There are no discontinued medications. No orders of the defined types were placed in this encounter.   Signed,  Maud Deed. Chenee Munns, MD   Outpatient Encounter Medications as of 04/03/2020  Medication Sig  . Accu-Chek Softclix Lancets lancets Use as instructed to test blood sugar daily E11.9  . aspirin (ASPIRIN LOW DOSE) 81 MG tablet Take 81 mg by mouth daily.    . Blood Glucose Monitoring Suppl (BLOOD GLUCOSE MONITOR SYSTEM) w/Device KIT  Use to test blood sugar up to 4 times daily Dx E11.9  . brimonidine-timolol (COMBIGAN) 0.2-0.5 % ophthalmic solution Place 1 drop into both eyes every 12 (twelve) hours.  . Cholecalciferol (VITAMIN D-3) 1000 UNITS CAPS Take 1 capsule by mouth daily.  . cyclobenzaprine (FLEXERIL) 5 MG tablet Take 1 tablet (5 mg total) by mouth 3 (three) times daily as needed for muscle spasms.  Marland Kitchen ezetimibe (ZETIA) 10 MG tablet TAKE ONE TABLET BY MOUTH  DAILY  . fexofenadine (ALLEGRA) 180 MG tablet Take 1 tablet (180 mg total) by mouth as needed for allergies or rhinitis.  . furosemide (LASIX) 20 MG tablet Take 1 tablet (20 mg total) by mouth daily. For leg swelling.  Marland Kitchen glipiZIDE (GLUCOTROL XL) 10 MG 24 hr tablet TAKE ONE TABLET BY MOUTH EVERY MORNING WITH BREAKFAST  . glucose blood (ACCU-CHEK GUIDE) test strip Use as instructed to test blood sugar up to 4 times daily Dx E11.9  . isosorbide mononitrate (IMDUR) 60 MG 24 hr tablet TAKE ONE TABLET BY MOUTH DAILY  . metoprolol succinate (TOPROL-XL) 100 MG 24 hr tablet TAKE ONE TABLET BY MOUTH EVERY MORNING WITH OR IMMEDIATELY FOLLOWING A MEAL  . nitroGLYCERIN (NITROSTAT) 0.4 MG SL tablet Place 1 tablet (0.4 mg total) under the tongue every 5 (five) minutes as needed for chest pain.  Marland Kitchen OVER THE COUNTER MEDICATION Take 1 tablet by mouth daily. Med name: VISION FORMULA  . pantoprazole (PROTONIX) 40 MG tablet Take 1 tablet (40 mg total) by mouth daily. For heartburn.  . sertraline (ZOLOFT) 25 MG tablet TAKE ONE TABLET BY MOUTH DAILY FOR STRESS  . timolol (TIMOPTIC) 0.5 % ophthalmic solution   . tiZANidine (ZANAFLEX) 4 MG tablet Take 1 tablet (4 mg total) by mouth every 8 (eight) hours as needed for muscle spasms.  Marland Kitchen trimethoprim (TRIMPEX) 100 MG tablet Take 100 mg by mouth 2 (two) times daily as needed (UTI).   . vitamin B-12 (CYANOCOBALAMIN) 1000 MCG tablet Take 1,000 mcg by mouth daily.  Marland Kitchen amLODipine (NORVASC) 5 MG tablet Take 1 tablet (5 mg total) by mouth daily. For blood pressure. (Patient not taking: Reported on 04/03/2020)  . [DISCONTINUED] loratadine (CLARITIN) 10 MG tablet Take 10 mg by mouth as needed.    No facility-administered encounter medications on file as of 04/03/2020.

## 2020-04-03 NOTE — Patient Instructions (Addendum)
Keep up with your rehab  Tennis elbow strap

## 2020-04-08 ENCOUNTER — Other Ambulatory Visit: Payer: Self-pay

## 2020-04-08 DIAGNOSIS — R609 Edema, unspecified: Secondary | ICD-10-CM

## 2020-04-15 ENCOUNTER — Ambulatory Visit (INDEPENDENT_AMBULATORY_CARE_PROVIDER_SITE_OTHER): Payer: Medicare PPO

## 2020-04-15 ENCOUNTER — Other Ambulatory Visit: Payer: Self-pay | Admitting: Podiatry

## 2020-04-15 ENCOUNTER — Ambulatory Visit: Payer: Medicare PPO | Admitting: Podiatry

## 2020-04-15 ENCOUNTER — Other Ambulatory Visit: Payer: Self-pay

## 2020-04-15 DIAGNOSIS — G629 Polyneuropathy, unspecified: Secondary | ICD-10-CM | POA: Diagnosis not present

## 2020-04-15 DIAGNOSIS — M7741 Metatarsalgia, right foot: Secondary | ICD-10-CM

## 2020-04-15 DIAGNOSIS — M79671 Pain in right foot: Secondary | ICD-10-CM

## 2020-04-15 DIAGNOSIS — M7742 Metatarsalgia, left foot: Secondary | ICD-10-CM | POA: Diagnosis not present

## 2020-04-15 DIAGNOSIS — M79672 Pain in left foot: Secondary | ICD-10-CM | POA: Diagnosis not present

## 2020-04-15 MED ORDER — GABAPENTIN 100 MG PO CAPS: 100.0000 mg | ORAL_CAPSULE | Freq: Three times a day (TID) | ORAL | 3 refills | Status: DC

## 2020-04-17 NOTE — Progress Notes (Signed)
   HPI: 86 y.o. female presenting today with a chief complaint of soreness and burning pain of the bilateral feet that began 1-2 years ago. She reports associated tingling and swelling of the feet. Driving and walking increases the pain. She has been taking Tylenol and Lasix for treatment of the symptoms. Patient is here for further evaluation and treatment.   Past Medical History:  Diagnosis Date  . Allergy    Cipro, Plavix, Statins  . CAD (coronary artery disease)    a. s/p tandem Promus DES to LAD in 2009 by Dr. Olevia Perches;  b.  LHC (03/22/14):  prox LAD 30%, mid LAD 40%, LAD stent ok with dist 20% ISR, apical LAD occluded with L-L collats filling apical vessel (too small for PCI), mid RCA 20%, EF 70%.  Med Rx  . Diabetes mellitus    Diagnosed 2012  . Diverticulosis of colon (without mention of hemorrhage)   . GERD (gastroesophageal reflux disease)   . Hx of cardiovascular stress test    a. Nuclear (08/2013):  No ischemia, EF 80%, Normal  . Hx of echocardiogram    a. Echo (07/2013):  Mild LVH, vigorous LVF, EF 65-70%, Gr 1 DD, mild MR, mild to mod LAE  . Hyperlipidemia    intol of statins  . Hypertension   . NHL (non-Hodgkin's lymphoma) ([place]) dx'd 2003   chemo/xrt comp 2003  . Personal history of colonic polyps   . Proctitis   . Pruritic intertrigo 07/18/2014  . Urticaria      Physical Exam: General: The patient is alert and oriented x3 in no acute distress.  Dermatology: Skin is warm, dry and supple bilateral lower extremities. Negative for open lesions or macerations.  Vascular: Palpable pedal pulses bilaterally. No edema or erythema noted. Capillary refill within normal limits.  Neurological: Epicritic and protective threshold diminished bilaterally.   Musculoskeletal Exam: Pain with palpation noted to the metatarsal heads of the bilateral feet. Range of motion within normal limits to all pedal and ankle joints bilateral. Muscle strength 5/5 in all groups bilateral.    Radiographic Exam:  Normal osseous mineralization. Joint spaces preserved. No fracture/dislocation/boney destruction.    Assessment: 1. Metatarsalgia bilateral  2. Peripheral polyneuropathy bilaterally secondary to diabetes mellitus    Plan of Care:  1. Patient evaluated. X-Rays reviewed.  2. Met pads applied to insoles.  3. Prescription for Gabapentin 100 mg TID provided to patient.  4. Recommended good shoe gear.  5. Return to clinic as needed.        Edrick Kins, DPM [facility]  Dr. Edrick Kins, DPM    [address], [place] [address]                Office [phone]  Fax [phone]

## 2020-04-18 DIAGNOSIS — Z1231 Encounter for screening mammogram for malignant neoplasm of breast: Secondary | ICD-10-CM | POA: Diagnosis not present

## 2020-04-18 LAB — HM MAMMOGRAPHY

## 2020-04-22 ENCOUNTER — Encounter: Payer: Self-pay | Admitting: Primary Care

## 2020-04-23 ENCOUNTER — Telehealth: Payer: Self-pay

## 2020-04-23 NOTE — Telephone Encounter (Signed)
Pt said this morning while taking shower when pt rubbed rt side of back pt felt sore area. Pt looked as best she could and saw red splotchy area the size of softball; sore but not painful and no itching. Pt is not sure if there are blisters or not. Pt has no covid symptoms, no travel and no known exposure to + covid. Pt has already taken a benadryl. Pt has early morning appts for testing at cardiology on 04/24/20; pt scheduled in office appt with Gentry Fitz NP on 04/24/20 at 10:40. UC precautions given and pt voiced understanding. FYI to Gentry Fitz NP.

## 2020-04-23 NOTE — Telephone Encounter (Signed)
Noted, will evaluate. 

## 2020-04-24 ENCOUNTER — Other Ambulatory Visit: Payer: Self-pay

## 2020-04-24 ENCOUNTER — Other Ambulatory Visit: Payer: Self-pay | Admitting: Primary Care

## 2020-04-24 ENCOUNTER — Encounter: Payer: Self-pay | Admitting: Primary Care

## 2020-04-24 ENCOUNTER — Ambulatory Visit: Payer: Medicare PPO | Admitting: Primary Care

## 2020-04-24 ENCOUNTER — Other Ambulatory Visit: Payer: Medicare PPO

## 2020-04-24 ENCOUNTER — Ambulatory Visit (HOSPITAL_COMMUNITY): Payer: Medicare PPO | Attending: Cardiology

## 2020-04-24 VITALS — BP 116/66 | HR 68 | Temp 97.2°F | Ht 68.0 in | Wt 168.5 lb

## 2020-04-24 DIAGNOSIS — C8590 Non-Hodgkin lymphoma, unspecified, unspecified site: Secondary | ICD-10-CM | POA: Diagnosis not present

## 2020-04-24 DIAGNOSIS — B029 Zoster without complications: Secondary | ICD-10-CM | POA: Insufficient documentation

## 2020-04-24 DIAGNOSIS — E785 Hyperlipidemia, unspecified: Secondary | ICD-10-CM | POA: Diagnosis not present

## 2020-04-24 DIAGNOSIS — I119 Hypertensive heart disease without heart failure: Secondary | ICD-10-CM | POA: Diagnosis not present

## 2020-04-24 DIAGNOSIS — R609 Edema, unspecified: Secondary | ICD-10-CM | POA: Insufficient documentation

## 2020-04-24 DIAGNOSIS — R600 Localized edema: Secondary | ICD-10-CM

## 2020-04-24 DIAGNOSIS — E119 Type 2 diabetes mellitus without complications: Secondary | ICD-10-CM | POA: Diagnosis not present

## 2020-04-24 DIAGNOSIS — I351 Nonrheumatic aortic (valve) insufficiency: Secondary | ICD-10-CM | POA: Diagnosis not present

## 2020-04-24 HISTORY — DX: Zoster without complications: B02.9

## 2020-04-24 LAB — BASIC METABOLIC PANEL
BUN/Creatinine Ratio: 14 (ref 12–28)
BUN: 17 mg/dL (ref 8–27)
CO2: 26 mmol/L (ref 20–29)
Calcium: 9.4 mg/dL (ref 8.7–10.3)
Chloride: 99 mmol/L (ref 96–106)
Creatinine, Ser: 1.24 mg/dL — ABNORMAL HIGH (ref 0.57–1.00)
GFR calc Af Amer: 45 mL/min/{1.73_m2} — ABNORMAL LOW (ref >59)
GFR calc non Af Amer: 39 mL/min/{1.73_m2} — ABNORMAL LOW (ref >59)
Glucose: 144 mg/dL — ABNORMAL HIGH (ref 65–99)
Potassium: 4.6 mmol/L (ref 3.5–5.2)
Sodium: 140 mmol/L (ref 134–144)

## 2020-04-24 MED ORDER — VALACYCLOVIR HCL 1 G PO TABS: 1000.0000 mg | ORAL_TABLET | Freq: Two times a day (BID) | ORAL | 0 refills | Status: DC

## 2020-04-24 NOTE — Patient Instructions (Signed)
Start valacyclovir (Valtrex) for shingles. Take 1 tablet by mouth twice daily for 7 days.   Please notify me if no improvement in 3 days.  It was a pleasure to see you today!

## 2020-04-24 NOTE — Progress Notes (Signed)
Subjective:    Patient ID: [name] [name], female    DOB: 1932-10-18, 86 y.o.   MRN: [id number]  HPI  This visit occurred during the SARS-CoV-2 public health emergency.  Safety protocols were in place, including screening questions prior to the visit, additional usage of staff PPE, and extensive cleaning of exam room while observing appropriate contact time as indicated for disinfecting solutions.   [name] [name] is a 86 year old female with a history of hypertension, type 2 diabetes, coronary atherosclerosis, hyperlipidemia, arthritis who presents today with a chief complaint of rash.  Her rash is located to the right thoracic/lateral trunk that she first noticed yesterday after drying off from her bath. The rash is "sore" with very little itching. Her bra strap irritates the site of the rash. She has noticed some radiating pain around her right lateral trunk without rash.   No new lotions, detergents, soaps or shampoos. No new medicines, vitamins, supplements. No new pets. No recent outdoor exposure or poison ivy exposure. No bonfire or smoke exposure.  No recent motel or hotel stay or new beds.   No fevers/chills, oral lesions, new joint pains, tick bites, abdominal pain, nausea.    Review of Systems  Constitutional: Negative for fever.  Skin: Positive for color change and rash.       Past Medical History:  Diagnosis Date  . Allergy    Cipro, Plavix, Statins  . CAD (coronary artery disease)    a. s/p tandem Promus DES to LAD in 2009 by Dr. Olevia Perches;  b.  LHC (03/22/14):  prox LAD 30%, mid LAD 40%, LAD stent ok with dist 20% ISR, apical LAD occluded with L-L collats filling apical vessel (too small for PCI), mid RCA 20%, EF 70%.  Med Rx  . Diabetes mellitus    Diagnosed 2012  . Diverticulosis of colon (without mention of hemorrhage)   . GERD (gastroesophageal reflux disease)   . Hx of cardiovascular stress test    a. Nuclear (08/2013):  No ischemia, EF 80%, Normal  . Hx of  echocardiogram    a. Echo (07/2013):  Mild LVH, vigorous LVF, EF 65-70%, Gr 1 DD, mild MR, mild to mod LAE  . Hyperlipidemia    intol of statins  . Hypertension   . NHL (non-Hodgkin's lymphoma) ([place]) dx'd 2003   chemo/xrt comp 2003  . Personal history of colonic polyps   . Proctitis   . Pruritic intertrigo 07/18/2014  . Urticaria      Social History   Socioeconomic History  . Marital status: Married    Spouse name: Not on file  . Number of children: Not on file  . Years of education: Not on file  . Highest education level: Not on file  Occupational History  . Occupation: Retired    Fish farm manager: RETIRED  Tobacco Use  . Smoking status: Never Smoker  . Smokeless tobacco: Never Used  Substance and Sexual Activity  . Alcohol use: No  . Drug use: No  . Sexual activity: Not Currently  Other Topics Concern  . Not on file  Social History Narrative  . Not on file   Social Determinants of Health   Financial Resource Strain:   . Difficulty of Paying Living Expenses:   Food Insecurity:   . Worried About Charity fundraiser in the Last Year:   . Arboriculturist in the Last Year:   Transportation Needs:   . Film/video editor (Medical):   Marland Kitchen Lack  of Transportation (Non-Medical):   Physical Activity:   . Days of Exercise per Week:   . Minutes of Exercise per Session:   Stress:   . Feeling of Stress :   Social Connections:   . Frequency of Communication with Friends and Family:   . Frequency of Social Gatherings with Friends and Family:   . Attends Religious Services:   . Active Member of Clubs or Organizations:   . Attends Archivist Meetings:   Marland Kitchen Marital Status:   Intimate Partner Violence:   . Fear of Current or Ex-Partner:   . Emotionally Abused:   Marland Kitchen Physically Abused:   . Sexually Abused:     Past Surgical History:  Procedure Laterality Date  . cardiac cath-neg    . CORONARY ANGIOPLASTY WITH STENT PLACEMENT     x 2  . dexa-neg    . KNEE SURGERY   10/2003   left  . laser surgery for cataracts-left    . LEFT HEART CATHETERIZATION WITH CORONARY ANGIOGRAM N/A 03/22/2014   Procedure: LEFT HEART CATHETERIZATION WITH CORONARY ANGIOGRAM;  Surgeon: Burnell Blanks, MD;  Location: [facility] CATH LAB;  Service: Cardiovascular;  Laterality: N/A;  . stress cardiolite    . VAGINAL HYSTERECTOMY     partial, fibroids, one ovary left    Family History  Problem Relation Age of Onset  . Cancer Mother        jaw  . Hypertension Father   . Heart attack Father   . Heart attack Sister   . Colon cancer Neg Hx     Allergies  Allergen Reactions  . Other Rash and Anaphylaxis  . Shellfish Allergy Anaphylaxis and Rash  . Cephalexin     REACTION: trash and swelling REACTION: trash and swelling  . Ciprofloxacin     REACTION: u/k  . Clopidogrel Bisulfate     REACTION: swelling, rash  . Codeine Phosphate     REACTION: unspecified  . Ezetimibe-Simvastatin     REACTION: myalgias REACTION: myalgias  . Hydrocod Polst-Cpm Polst Er     REACTION: u/k  . Lidocaine Hives    ONLY TO ADHESIVE LIDOCAINE PATCHES. NO PROBLEM WITH INJECTABLE LIDOCAINE.  Marland Kitchen Nitrofurantoin     REACTION: [place] REACTION: [place]  . Pregabalin     REACTION: felt bad REACTION: felt bad  . Propoxyphene N-Acetaminophen     REACTION: [place]  . Rofecoxib     unk unk   . Sulfamethoxazole     REACTION: unspecified  . Sulfonamide Derivatives     REACTION: unspecified  . Celecoxib Rash    REACTION: [place] REACTION: [place]  . Codeine Rash    REACTION: [place] REACTION: [place]  . Hydrocod Polst-Cpm Polst Er Rash  . Propoxyphene Rash  . Sulfa Antibiotics Rash    REACTION: unspecified REACTION: unspecified    Current Outpatient Medications on File Prior to Visit  Medication Sig Dispense Refill  . Accu-Chek Softclix Lancets lancets Use as instructed to test blood sugar daily E11.9 100 each 5  . aspirin (ASPIRIN LOW DOSE) 81 MG tablet Take 81 mg by mouth daily.      . Blood Glucose Monitoring Suppl  (BLOOD GLUCOSE MONITOR SYSTEM) w/Device KIT Use to test blood sugar up to 4 times daily Dx E11.9 1 kit 0  . brimonidine-timolol (COMBIGAN) 0.2-0.5 % ophthalmic solution Place 1 drop into both eyes every 12 (twelve) hours.    . Cholecalciferol (VITAMIN D-3) 1000 UNITS CAPS Take 1 capsule by mouth daily.    Marland Kitchen  cyclobenzaprine (FLEXERIL) 5 MG tablet Take 1 tablet (5 mg total) by mouth 3 (three) times daily as needed for muscle spasms. 30 tablet 0  . ezetimibe (ZETIA) 10 MG tablet TAKE ONE TABLET BY MOUTH DAILY 90 tablet 3  . fexofenadine (ALLEGRA) 180 MG tablet Take 1 tablet (180 mg total) by mouth as needed for allergies or rhinitis. 90 tablet 0  . furosemide (LASIX) 20 MG tablet Take 1 tablet (20 mg total) by mouth daily. For leg swelling. 30 tablet 0  . gabapentin (NEURONTIN) 100 MG capsule Take 1 capsule (100 mg total) by mouth 3 (three) times daily. 90 capsule 3  . glipiZIDE (GLUCOTROL XL) 10 MG 24 hr tablet TAKE ONE TABLET BY MOUTH EVERY MORNING WITH BREAKFAST 90 tablet 1  . glucose blood (ACCU-CHEK GUIDE) test strip Use as instructed to test blood sugar up to 4 times daily Dx E11.9 100 each 5  . isosorbide mononitrate (IMDUR) 60 MG 24 hr tablet TAKE ONE TABLET BY MOUTH DAILY 90 tablet 1  . metoprolol succinate (TOPROL-XL) 100 MG 24 hr tablet TAKE ONE TABLET BY MOUTH EVERY MORNING WITH OR IMMEDIATELY FOLLOWING A MEAL 90 tablet 2  . nitroGLYCERIN (NITROSTAT) 0.4 MG SL tablet Place 1 tablet (0.4 mg total) under the tongue every 5 (five) minutes as needed for chest pain. 26 tablet 0  . OVER THE COUNTER MEDICATION Take 1 tablet by mouth daily. Med name: VISION FORMULA    . pantoprazole (PROTONIX) 40 MG tablet Take 1 tablet (40 mg total) by mouth daily. For heartburn. 90 tablet 3  . sertraline (ZOLOFT) 25 MG tablet TAKE ONE TABLET BY MOUTH DAILY FOR STRESS 90 tablet 1  . timolol (TIMOPTIC) 0.5 % ophthalmic solution     . tiZANidine (ZANAFLEX) 4 MG tablet Take 1 tablet (4 mg total) by mouth every 8  (eight) hours as needed for muscle spasms. 30 tablet 0  . trimethoprim (TRIMPEX) 100 MG tablet Take 100 mg by mouth 2 (two) times daily as needed (UTI).     . vitamin B-12 (CYANOCOBALAMIN) 1000 MCG tablet Take 1,000 mcg by mouth daily.    . [DISCONTINUED] loratadine (CLARITIN) 10 MG tablet Take 10 mg by mouth as needed.      No current facility-administered medications on file prior to visit.    BP 116/66   Pulse 68   Temp (!) 97.2 F (36.2 C) (Temporal)   Ht _0  (1.727 m)   Wt 168 lb 8 oz (76.4 kg)   SpO2 97%   BMI 25.62 kg/m    Objective:   Physical Exam  Constitutional: She appears well-nourished.  Cardiovascular: Normal rate and regular rhythm.  Respiratory: Effort normal and breath sounds normal.  Skin: Skin is warm and dry. Rash noted.  Round patch of slightly raised erythema with several small early developing vesicles.            Assessment & Plan:

## 2020-04-24 NOTE — Assessment & Plan Note (Signed)
Acute for the last 24-48 hours, and obvious on exam today. Rx for Valtrex course, renally dosed, sent to pharmacy. She will do BID dosing x 7 days.   She will update if no improvement in 3 days.

## 2020-04-27 ENCOUNTER — Other Ambulatory Visit: Payer: Self-pay | Admitting: Primary Care

## 2020-04-27 DIAGNOSIS — B029 Zoster without complications: Secondary | ICD-10-CM

## 2020-05-05 ENCOUNTER — Ambulatory Visit: Payer: Medicare PPO | Admitting: Cardiovascular Disease

## 2020-05-05 ENCOUNTER — Encounter: Payer: Self-pay | Admitting: Cardiovascular Disease

## 2020-05-05 ENCOUNTER — Other Ambulatory Visit: Payer: Self-pay

## 2020-05-05 VITALS — BP 122/64 | HR 69 | Ht 68.0 in | Wt 169.0 lb

## 2020-05-05 DIAGNOSIS — E7800 Pure hypercholesterolemia, unspecified: Secondary | ICD-10-CM

## 2020-05-05 DIAGNOSIS — I10 Essential (primary) hypertension: Secondary | ICD-10-CM | POA: Diagnosis not present

## 2020-05-05 DIAGNOSIS — I2510 Atherosclerotic heart disease of native coronary artery without angina pectoris: Secondary | ICD-10-CM | POA: Diagnosis not present

## 2020-05-05 DIAGNOSIS — I351 Nonrheumatic aortic (valve) insufficiency: Secondary | ICD-10-CM

## 2020-05-05 NOTE — Progress Notes (Signed)
4  Chief Complaint  Patient presents with  . Follow-up    CAD   History of Present Illness: 86 yo female with history of CAD, HLD, HTN, non-Hodgkin's lymphoma, DM here today for cardiac follow up. In 2009 she had tandem non-overlapping drug-eluting stents placed in the LAD. Last cardiac cath April 2015 with patent LAD stents, occluded apical LAD segment with left to left collaterals and non-obstructive disease elsewhere. She has been intolerant to statins and Plavix in the past. She has been on Zetia. Echo 01/20/16 with normal LV size and function, trivial aortic valve insufficiency. She has had bilateral foot pain and normal ABI in 2016. Last nuclear stress test October 2017 with no ischemia. She has stable angina controlled with Imdur.  Echo May 2021 with LVEF=60-65%. Mild AI.   He is here today for follow up. The patient denies any chest pain, dyspnea, palpitations, lower extremity edema, orthopnea, PND, dizziness, near syncope or syncope.   Primary Care Physician: Pleas Koch, NP  Past Medical History:  Diagnosis Date  . Allergy    Cipro, Plavix, Statins  . CAD (coronary artery disease)    a. s/p tandem Promus DES to LAD in 2009 by Dr. Olevia Perches;  b.  LHC (03/22/14):  prox LAD 30%, mid LAD 40%, LAD stent ok with dist 20% ISR, apical LAD occluded with L-L collats filling apical vessel (too small for PCI), mid RCA 20%, EF 70%.  Med Rx  . Diabetes mellitus    Diagnosed 2012  . Diverticulosis of colon (without mention of hemorrhage)   . GERD (gastroesophageal reflux disease)   . Hx of cardiovascular stress test    a. Nuclear (08/2013):  No ischemia, EF 80%, Normal  . Hx of echocardiogram    a. Echo (07/2013):  Mild LVH, vigorous LVF, EF 65-70%, Gr 1 DD, mild MR, mild to mod LAE  . Hyperlipidemia    intol of statins  . Hypertension   . NHL (non-Hodgkin's lymphoma) ([place]) dx'd 2003   chemo/xrt comp 2003  . Personal history of colonic polyps   . Proctitis   . Pruritic intertrigo  07/18/2014  . Urticaria     Past Surgical History:  Procedure Laterality Date  . cardiac cath-neg    . CORONARY ANGIOPLASTY WITH STENT PLACEMENT     x 2  . dexa-neg    . KNEE SURGERY  10/2003   left  . laser surgery for cataracts-left    . LEFT HEART CATHETERIZATION WITH CORONARY ANGIOGRAM N/A 03/22/2014   Procedure: LEFT HEART CATHETERIZATION WITH CORONARY ANGIOGRAM;  Surgeon: Burnell Blanks, MD;  Location: [facility] CATH LAB;  Service: Cardiovascular;  Laterality: N/A;  . stress cardiolite    . VAGINAL HYSTERECTOMY     partial, fibroids, one ovary left    Current Outpatient Medications  Medication Sig Dispense Refill  . Accu-Chek Softclix Lancets lancets Use as instructed to test blood sugar daily E11.9 100 each 5  . aspirin (ASPIRIN LOW DOSE) 81 MG tablet Take 81 mg by mouth daily.      . Blood Glucose Monitoring Suppl (BLOOD GLUCOSE MONITOR SYSTEM) w/Device KIT Use to test blood sugar up to 4 times daily Dx E11.9 1 kit 0  . brimonidine-timolol (COMBIGAN) 0.2-0.5 % ophthalmic solution Place 1 drop into both eyes every 12 (twelve) hours.    . Cholecalciferol (VITAMIN D-3) 1000 UNITS CAPS Take 1 capsule by mouth daily.    . cyclobenzaprine (FLEXERIL) 5 MG tablet Take 1 tablet (5 mg total) by  mouth 3 (three) times daily as needed for muscle spasms. 30 tablet 0  . ezetimibe (ZETIA) 10 MG tablet TAKE ONE TABLET BY MOUTH DAILY 90 tablet 3  . fexofenadine (ALLEGRA) 180 MG tablet Take 1 tablet (180 mg total) by mouth as needed for allergies or rhinitis. 90 tablet 0  . furosemide (LASIX) 20 MG tablet TAKE ONE TABLET BY MOUTH DAILY FOR LEG SWELLING 30 tablet 0  . gabapentin (NEURONTIN) 100 MG capsule Take 1 capsule (100 mg total) by mouth 3 (three) times daily. 90 capsule 3  . glipiZIDE (GLUCOTROL XL) 10 MG 24 hr tablet TAKE ONE TABLET BY MOUTH EVERY MORNING WITH BREAKFAST 90 tablet 1  . glucose blood (ACCU-CHEK GUIDE) test strip Use as instructed to test blood sugar up to 4 times daily Dx  E11.9 100 each 5  . isosorbide mononitrate (IMDUR) 60 MG 24 hr tablet TAKE ONE TABLET BY MOUTH DAILY 90 tablet 1  . metoprolol succinate (TOPROL-XL) 100 MG 24 hr tablet TAKE ONE TABLET BY MOUTH EVERY MORNING WITH OR IMMEDIATELY FOLLOWING A MEAL 90 tablet 2  . nitroGLYCERIN (NITROSTAT) 0.4 MG SL tablet Place 1 tablet (0.4 mg total) under the tongue every 5 (five) minutes as needed for chest pain. 26 tablet 0  . OVER THE COUNTER MEDICATION Take 1 tablet by mouth daily. Med name: VISION FORMULA    . pantoprazole (PROTONIX) 40 MG tablet Take 1 tablet (40 mg total) by mouth daily. For heartburn. 90 tablet 3  . sertraline (ZOLOFT) 25 MG tablet TAKE ONE TABLET BY MOUTH DAILY FOR STRESS 90 tablet 1  . timolol (TIMOPTIC) 0.5 % ophthalmic solution     . tiZANidine (ZANAFLEX) 4 MG tablet Take 1 tablet (4 mg total) by mouth every 8 (eight) hours as needed for muscle spasms. 30 tablet 0  . trimethoprim (TRIMPEX) 100 MG tablet Take 100 mg by mouth 2 (two) times daily as needed (UTI).     . valACYclovir (VALTREX) 1000 MG tablet Take 1 tablet (1,000 mg total) by mouth 2 (two) times daily. For shingles. 14 tablet 0  . vitamin B-12 (CYANOCOBALAMIN) 1000 MCG tablet Take 1,000 mcg by mouth daily.     No current facility-administered medications for this visit.    Allergies  Allergen Reactions  . Other Rash and Anaphylaxis  . Shellfish Allergy Anaphylaxis and Rash  . Cephalexin     REACTION: trash and swelling REACTION: trash and swelling  . Ciprofloxacin     REACTION: u/k  . Clopidogrel Bisulfate     REACTION: swelling, rash  . Codeine Phosphate     REACTION: unspecified  . Ezetimibe-Simvastatin     REACTION: myalgias REACTION: myalgias  . Hydrocod Polst-Cpm Polst Er     REACTION: u/k  . Lidocaine Hives    ONLY TO ADHESIVE LIDOCAINE PATCHES. NO PROBLEM WITH INJECTABLE LIDOCAINE.  Marland Kitchen Nitrofurantoin     REACTION: [place] REACTION: [place]  . Pregabalin     REACTION: felt bad REACTION: felt bad  .  Propoxyphene N-Acetaminophen     REACTION: [place]  . Rofecoxib     unk unk   . Sulfamethoxazole     REACTION: unspecified  . Sulfonamide Derivatives     REACTION: unspecified  . Celecoxib Rash    REACTION: [place] REACTION: [place]  . Codeine Rash    REACTION: [place] REACTION: [place]  . Hydrocod Polst-Cpm Polst Er Rash  . Propoxyphene Rash  . Sulfa Antibiotics Rash    REACTION: unspecified REACTION: unspecified    Social  History   Socioeconomic History  . Marital status: Married    Spouse name: Not on file  . Number of children: Not on file  . Years of education: Not on file  . Highest education level: Not on file  Occupational History  . Occupation: Retired    Fish farm manager: RETIRED  Tobacco Use  . Smoking status: Never Smoker  . Smokeless tobacco: Never Used  Substance and Sexual Activity  . Alcohol use: No  . Drug use: No  . Sexual activity: Not Currently  Other Topics Concern  . Not on file  Social History Narrative  . Not on file   Social Determinants of Health   Financial Resource Strain:   . Difficulty of Paying Living Expenses:   Food Insecurity:   . Worried About Charity fundraiser in the Last Year:   . Arboriculturist in the Last Year:   Transportation Needs:   . Film/video editor (Medical):   Marland Kitchen Lack of Transportation (Non-Medical):   Physical Activity:   . Days of Exercise per Week:   . Minutes of Exercise per Session:   Stress:   . Feeling of Stress :   Social Connections:   . Frequency of Communication with Friends and Family:   . Frequency of Social Gatherings with Friends and Family:   . Attends Religious Services:   . Active Member of Clubs or Organizations:   . Attends Archivist Meetings:   Marland Kitchen Marital Status:   Intimate Partner Violence:   . Fear of Current or Ex-Partner:   . Emotionally Abused:   Marland Kitchen Physically Abused:   . Sexually Abused:     Family History  Problem Relation Age of Onset  . Cancer Mother        jaw  . Hypertension  Father   . Heart attack Father   . Heart attack Sister   . Colon cancer Neg Hx     Review of Systems:  As stated in the HPI and otherwise negative.   BP 122/64   Pulse 69   Ht 5' 8"  (1.727 m)   Wt 169 lb (76.7 kg)   SpO2 96%   BMI 25.70 kg/m   Physical Examination:  General: Well developed, well nourished, NAD  HEENT: OP clear, mucus membranes moist  SKIN: warm, dry. No rashes. Neuro: No focal deficits  Musculoskeletal: Muscle strength 5/5 all ext  Psychiatric: Mood and affect normal  Neck: No JVD, no carotid bruits, no thyromegaly, no lymphadenopathy.  Lungs:Clear bilaterally, no wheezes, rhonci, crackles Cardiovascular: Regular rate and rhythm. No murmurs, gallops or rubs. Abdomen:Soft. Bowel sounds present. Non-tender.  Extremities: No lower extremity edema. Pulses are 2 + in the bilateral DP/PT.  Echo 01/20/16: Left ventricle: The cavity size was normal. Wall thickness was   increased in a pattern of mild LVH. There was mild focal basal   hypertrophy of the septum. Systolic function was normal. The   estimated ejection fraction was in the range of 60% to 65%. Wall   motion was normal; there were no regional wall motion   abnormalities. Doppler parameters are consistent with abnormal   left ventricular relaxation (grade 1 diastolic dysfunction).   Doppler parameters are consistent with high ventricular filling   pressure. - Aortic valve: There was trivial regurgitation. - Mitral valve: Calcified annulus.  Cardiac cath 03/22/14: Left main: No obstructive disease.  Left Anterior Descending Artery: Large caliber vessel that courses to the apex. 30% proximal stenosis. 40% mid  stenosis just prior to the stented segment. Mid stented segment patent without restenosis. Distal stented segment patent with 20% restenosis. Apical LAD becomes very small in caliber and has total occlusion with filling of apical vessel by left to left collaterals. (0.75 mm vessel).  Circumflex  Artery: Large caliber vessel with several small OM branches. Diffuse luminal irregularities in the mid vessel.  Right Coronary Artery: Large dominant vessel with 20% mid stenosis.  Left Ventricular Angiogram: LVEF=70%.   EKG:  EKG is not ordered today. The ekg ordered today demonstrates   Recent Labs: 05/29/2019: ALT 27 03/26/2020: Pro B Natriuretic peptide (BNP) 282.0 04/24/2020: BUN 17; Creatinine, Ser 1.24; Potassium 4.6; Sodium 140   Lipid Panel    Component Value Date/Time   CHOL 165 05/29/2019 0750   TRIG 181.0 (H) 05/29/2019 0750   HDL 44.50 05/29/2019 0750   CHOLHDL 4 05/29/2019 0750   VLDL 36.2 05/29/2019 0750   LDLCALC 84 05/29/2019 0750   LDLDIRECT 93.0 08/06/2015 0847     Wt Readings from Last 3 Encounters:  05/05/20 169 lb (76.7 kg)  04/24/20 168 lb 8 oz (76.4 kg)  04/03/20 169 lb (76.7 kg)     Other studies Reviewed: Additional studies/ records that were reviewed today include: . Review of the above records demonstrates:   Assessment and Plan:   1. CAD with stable angina: Stable chronic chest pain. Continue ASA, Zetia, Toprol and Imdur. She is statin intolerant.   2. Hyperlipidemia: She is statin intolerant. LDL 84 in 2020.   3. HTN: BP is controlled.   4. Aortic insufficiency: Mild by echo may 2021. Repeat echo in May 2023.   Current medicines are reviewed at length with the patient today.  The patient does not have concerns regarding medicines.  The following changes have been made:  no change  Labs/ tests ordered today include:   No orders of the defined types were placed in this encounter.  Disposition:   FU with me in 12 months  Signed, Lauree Chandler, MD 05/05/2020 2:43 PM    [facility] [address], [place], [place]  [address] Phone: [phone]; Fax: [phone]

## 2020-05-09 DIAGNOSIS — H401133 Primary open-angle glaucoma, bilateral, severe stage: Secondary | ICD-10-CM | POA: Diagnosis not present

## 2020-05-09 DIAGNOSIS — Z83511 Family history of glaucoma: Secondary | ICD-10-CM | POA: Diagnosis not present

## 2020-05-10 ENCOUNTER — Other Ambulatory Visit: Payer: Self-pay | Admitting: Primary Care

## 2020-05-10 DIAGNOSIS — E0821 Diabetes mellitus due to underlying condition with diabetic nephropathy: Secondary | ICD-10-CM

## 2020-05-26 DIAGNOSIS — H9201 Otalgia, right ear: Secondary | ICD-10-CM | POA: Diagnosis not present

## 2020-05-26 DIAGNOSIS — J301 Allergic rhinitis due to pollen: Secondary | ICD-10-CM | POA: Diagnosis not present

## 2020-06-04 ENCOUNTER — Other Ambulatory Visit: Payer: Self-pay | Admitting: Primary Care

## 2020-06-04 DIAGNOSIS — R600 Localized edema: Secondary | ICD-10-CM

## 2020-06-05 NOTE — Telephone Encounter (Signed)
Refills sent to pharmacy. 

## 2020-06-05 NOTE — Telephone Encounter (Signed)
Last prescribed on 04/30/2020 Last OV ( acute) with Allie Bossier on 04/24/2020  Future OV scheduled on 06/23/2020

## 2020-06-09 ENCOUNTER — Other Ambulatory Visit: Payer: Self-pay | Admitting: Primary Care

## 2020-06-09 DIAGNOSIS — E538 Deficiency of other specified B group vitamins: Secondary | ICD-10-CM

## 2020-06-09 DIAGNOSIS — I10 Essential (primary) hypertension: Secondary | ICD-10-CM

## 2020-06-09 DIAGNOSIS — E782 Mixed hyperlipidemia: Secondary | ICD-10-CM

## 2020-06-09 DIAGNOSIS — E119 Type 2 diabetes mellitus without complications: Secondary | ICD-10-CM

## 2020-06-14 ENCOUNTER — Other Ambulatory Visit: Payer: Self-pay | Admitting: Primary Care

## 2020-06-14 DIAGNOSIS — K219 Gastro-esophageal reflux disease without esophagitis: Secondary | ICD-10-CM

## 2020-06-14 DIAGNOSIS — Z6379 Other stressful life events affecting family and household: Secondary | ICD-10-CM

## 2020-06-19 ENCOUNTER — Other Ambulatory Visit (INDEPENDENT_AMBULATORY_CARE_PROVIDER_SITE_OTHER): Payer: Medicare PPO

## 2020-06-19 ENCOUNTER — Ambulatory Visit: Payer: Medicare PPO

## 2020-06-19 ENCOUNTER — Other Ambulatory Visit: Payer: Self-pay

## 2020-06-19 DIAGNOSIS — I10 Essential (primary) hypertension: Secondary | ICD-10-CM

## 2020-06-19 DIAGNOSIS — E119 Type 2 diabetes mellitus without complications: Secondary | ICD-10-CM

## 2020-06-19 DIAGNOSIS — E782 Mixed hyperlipidemia: Secondary | ICD-10-CM

## 2020-06-19 DIAGNOSIS — E538 Deficiency of other specified B group vitamins: Secondary | ICD-10-CM | POA: Diagnosis not present

## 2020-06-19 LAB — CBC
HCT: 40.2 % (ref 36.0–46.0)
Hemoglobin: 13.2 g/dL (ref 12.0–15.0)
MCHC: 32.8 g/dL (ref 30.0–36.0)
MCV: 97.2 fl (ref 78.0–100.0)
Platelets: 212 10*3/uL (ref 150.0–400.0)
RBC: 4.14 Mil/uL (ref 3.87–5.11)
RDW: 14 % (ref 11.5–15.5)
WBC: 4.4 10*3/uL (ref 4.0–10.5)

## 2020-06-19 LAB — LIPID PANEL
Cholesterol: 169 mg/dL (ref 0–200)
HDL: 47.2 mg/dL (ref >39.00)
LDL Cholesterol: 93 mg/dL (ref 0–99)
NonHDL: 121.82
Total CHOL/HDL Ratio: 4
Triglycerides: 146 mg/dL (ref 0.0–149.0)
VLDL: 29.2 mg/dL (ref 0.0–40.0)

## 2020-06-19 LAB — COMPREHENSIVE METABOLIC PANEL
ALT: 40 U/L — ABNORMAL HIGH (ref 0–35)
AST: 43 U/L — ABNORMAL HIGH (ref 0–37)
Albumin: 4.2 g/dL (ref 3.5–5.2)
Alkaline Phosphatase: 47 U/L (ref 39–117)
BUN: 17 mg/dL (ref 6–23)
CO2: 31 mEq/L (ref 19–32)
Calcium: 9.5 mg/dL (ref 8.4–10.5)
Chloride: 103 mEq/L (ref 96–112)
Creatinine, Ser: 1.25 mg/dL — ABNORMAL HIGH (ref 0.40–1.20)
GFR: 40.6 mL/min — ABNORMAL LOW (ref >60.00)
Glucose, Bld: 127 mg/dL — ABNORMAL HIGH (ref 70–99)
Potassium: 5.2 mEq/L — ABNORMAL HIGH (ref 3.5–5.1)
Sodium: 138 mEq/L (ref 135–145)
Total Bilirubin: 0.9 mg/dL (ref 0.2–1.2)
Total Protein: 7.2 g/dL (ref 6.0–8.3)

## 2020-06-19 LAB — HEMOGLOBIN A1C: Hgb A1c MFr Bld: 8.1 % — ABNORMAL HIGH (ref 4.6–6.5)

## 2020-06-19 LAB — VITAMIN B12: Vitamin B-12: 838 pg/mL (ref 211–911)

## 2020-06-20 ENCOUNTER — Ambulatory Visit (INDEPENDENT_AMBULATORY_CARE_PROVIDER_SITE_OTHER): Payer: Medicare PPO

## 2020-06-20 DIAGNOSIS — Z0000 Encounter for general adult medical examination without abnormal findings: Secondary | ICD-10-CM | POA: Diagnosis not present

## 2020-06-20 NOTE — Progress Notes (Signed)
Subjective:   [name] [name] is a 86 y.o. female who presents for Medicare Annual (Subsequent) preventive examination.  Review of Systems: N/A      I connected with the patient today by telephone and verified that I am speaking with the correct person using two identifiers. Location patient: home Location nurse: work Persons participating in the virtual visit: patient, Marine scientist.   I discussed the limitations, risks, security and privacy concerns of performing an evaluation and management service by telephone and the availability of in person appointments. I also discussed with the patient that there may be a patient responsible charge related to this service. The patient expressed understanding and verbally consented to this telephonic visit.    Interactive audio and video telecommunications were attempted between this nurse and patient, however failed, due to patient having technical difficulties OR patient did not have access to video capability.  We continued and completed visit with audio only.     Cardiac Risk Factors include: advanced age (>67mn, >>61women);diabetes mellitus     Objective:    Today's Vitals   There is no height or weight on file to calculate BMI.  Advanced Directives 06/20/2020 04/27/2019 04/27/2017 03/22/2016 03/22/2014  Does Patient Have a Medical Advance Directive? No No No No Patient does not have advance directive;Patient would not like information  Would patient like information on creating a medical advance directive? No - Patient declined No - Patient declined - Yes - Educational materials given -    Current Medications (verified) Outpatient Encounter Medications as of 06/20/2020  Medication Sig  . Accu-Chek Softclix Lancets lancets Use as instructed to test blood sugar daily E11.9  . aspirin (ASPIRIN LOW DOSE) 81 MG tablet Take 81 mg by mouth daily.    . Blood Glucose Monitoring Suppl (BLOOD GLUCOSE MONITOR SYSTEM) w/Device KIT Use to test blood sugar up  to 4 times daily Dx E11.9  . brimonidine-timolol (COMBIGAN) 0.2-0.5 % ophthalmic solution Place 1 drop into both eyes every 12 (twelve) hours.  . Cholecalciferol (VITAMIN D-3) 1000 UNITS CAPS Take 1 capsule by mouth daily.  . cyclobenzaprine (FLEXERIL) 5 MG tablet Take 1 tablet (5 mg total) by mouth 3 (three) times daily as needed for muscle spasms.  .Marland Kitchenezetimibe (ZETIA) 10 MG tablet TAKE ONE TABLET BY MOUTH DAILY  . fexofenadine (ALLEGRA) 180 MG tablet Take 1 tablet (180 mg total) by mouth as needed for allergies or rhinitis.  . furosemide (LASIX) 20 MG tablet TAKE ONE TABLET BY MOUTH DAILY FOR LEG SWELLING  . gabapentin (NEURONTIN) 100 MG capsule Take 1 capsule (100 mg total) by mouth 3 (three) times daily.  .Marland KitchenglipiZIDE (GLUCOTROL XL) 10 MG 24 hr tablet TAKE ONE TABLET BY MOUTH EVERY MORNING WITH BREAKFAST  . glucose blood (ACCU-CHEK GUIDE) test strip Use as instructed to test blood sugar up to 4 times daily Dx E11.9  . isosorbide mononitrate (IMDUR) 60 MG 24 hr tablet TAKE ONE TABLET BY MOUTH DAILY  . metoprolol succinate (TOPROL-XL) 100 MG 24 hr tablet TAKE ONE TABLET BY MOUTH EVERY MORNING WITH OR IMMEDIATELY FOLLOWING A MEAL  . nitroGLYCERIN (NITROSTAT) 0.4 MG SL tablet Place 1 tablet (0.4 mg total) under the tongue every 5 (five) minutes as needed for chest pain.  .Marland KitchenOVER THE COUNTER MEDICATION Take 1 tablet by mouth daily. Med name: VISION FORMULA  . pantoprazole (PROTONIX) 40 MG tablet TAKE ONE TABLET BY MOUTH DAILY FOR HEARTBURN  . sertraline (ZOLOFT) 25 MG tablet TAKE ONE TABLET BY  MOUTH DAILY FOR STRESS  . timolol (TIMOPTIC) 0.5 % ophthalmic solution   . tiZANidine (ZANAFLEX) 4 MG tablet Take 1 tablet (4 mg total) by mouth every 8 (eight) hours as needed for muscle spasms.  Marland Kitchen trimethoprim (TRIMPEX) 100 MG tablet Take 100 mg by mouth 2 (two) times daily as needed (UTI).   . valACYclovir (VALTREX) 1000 MG tablet Take 1 tablet (1,000 mg total) by mouth 2 (two) times daily. For shingles.  .  vitamin B-12 (CYANOCOBALAMIN) 1000 MCG tablet Take 1,000 mcg by mouth daily.  . [DISCONTINUED] loratadine (CLARITIN) 10 MG tablet Take 10 mg by mouth as needed.    No facility-administered encounter medications on file as of 06/20/2020.    Allergies (verified) Other, Shellfish allergy, Cephalexin, Ciprofloxacin, Clopidogrel bisulfate, Codeine phosphate, Ezetimibe-simvastatin, Hydrocod polst-cpm polst er, Lidocaine, Nitrofurantoin, Pregabalin, Propoxyphene n-acetaminophen, Rofecoxib, Sulfamethoxazole, Sulfonamide derivatives, Celecoxib, Codeine, Hydrocod polst-cpm polst er, Propoxyphene, and Sulfa antibiotics   History: Past Medical History:  Diagnosis Date  . Allergy    Cipro, Plavix, Statins  . CAD (coronary artery disease)    a. s/p tandem Promus DES to LAD in 2009 by Dr. Olevia Perches;  b.  LHC (03/22/14):  prox LAD 30%, mid LAD 40%, LAD stent ok with dist 20% ISR, apical LAD occluded with L-L collats filling apical vessel (too small for PCI), mid RCA 20%, EF 70%.  Med Rx  . Diabetes mellitus    Diagnosed 2012  . Diverticulosis of colon (without mention of hemorrhage)   . GERD (gastroesophageal reflux disease)   . Hx of cardiovascular stress test    a. Nuclear (08/2013):  No ischemia, EF 80%, Normal  . Hx of echocardiogram    a. Echo (07/2013):  Mild LVH, vigorous LVF, EF 65-70%, Gr 1 DD, mild MR, mild to mod LAE  . Hyperlipidemia    intol of statins  . Hypertension   . NHL (non-Hodgkin's lymphoma) ([place]) dx'd 2003   chemo/xrt comp 2003  . Personal history of colonic polyps   . Proctitis   . Pruritic intertrigo 07/18/2014  . Urticaria    Past Surgical History:  Procedure Laterality Date  . cardiac cath-neg    . CORONARY ANGIOPLASTY WITH STENT PLACEMENT     x 2  . dexa-neg    . KNEE SURGERY  10/2003   left  . laser surgery for cataracts-left    . LEFT HEART CATHETERIZATION WITH CORONARY ANGIOGRAM N/A 03/22/2014   Procedure: LEFT HEART CATHETERIZATION WITH CORONARY ANGIOGRAM;   Surgeon: Burnell Blanks, MD;  Location: [facility] CATH LAB;  Service: Cardiovascular;  Laterality: N/A;  . stress cardiolite    . VAGINAL HYSTERECTOMY     partial, fibroids, one ovary left   Family History  Problem Relation Age of Onset  . Cancer Mother        jaw  . Hypertension Father   . Heart attack Father   . Heart attack Sister   . Colon cancer Neg Hx    Social History   Socioeconomic History  . Marital status: Married    Spouse name: Not on file  . Number of children: Not on file  . Years of education: Not on file  . Highest education level: Not on file  Occupational History  . Occupation: Retired    Fish farm manager: RETIRED  Tobacco Use  . Smoking status: Never Smoker  . Smokeless tobacco: Never Used  Vaping Use  . Vaping Use: Never used  Substance and Sexual Activity  . Alcohol use: No  .  Drug use: No  . Sexual activity: Not Currently  Other Topics Concern  . Not on file  Social History Narrative  . Not on file   Social Determinants of Health   Financial Resource Strain: Low Risk   . Difficulty of Paying Living Expenses: Not hard at all  Food Insecurity: No Food Insecurity  . Worried About Charity fundraiser in the Last Year: Never true  . Ran Out of Food in the Last Year: Never true  Transportation Needs: No Transportation Needs  . Lack of Transportation (Medical): No  . Lack of Transportation (Non-Medical): No  Physical Activity: Insufficiently Active  . Days of Exercise per Week: 7 days  . Minutes of Exercise per Session: 20 min  Stress: No Stress Concern Present  . Feeling of Stress : Not at all  Social Connections:   . Frequency of Communication with Friends and Family:   . Frequency of Social Gatherings with Friends and Family:   . Attends Religious Services:   . Active Member of Clubs or Organizations:   . Attends Archivist Meetings:   Marland Kitchen Marital Status:     Tobacco Counseling Counseling given: Not Answered   Clinical  Intake:  Pre-visit preparation completed: Yes  Pain : No/denies pain     Nutritional Risks: None Diabetes: Yes CBG done?: No Did pt. bring in CBG monitor from home?: No  How often do you need to have someone help you when you read instructions, pamphlets, or other written materials from your doctor or pharmacy?: 1 - Never What is the last grade level you completed in school?: 12th  Diabetic: Yes Nutrition Risk Assessment:  Has the patient had any N/V/D within the last 2 months?  No  Does the patient have any non-healing wounds?  No  Has the patient had any unintentional weight loss or weight gain?  No   Diabetes:  Is the patient diabetic?  Yes  If diabetic, was a CBG obtained today?  No  Did the patient bring in their glucometer from home?  No  How often do you monitor your CBG's? Once daily.   Financial Strains and Diabetes Management:  Are you having any financial strains with the device, your supplies or your medication? No .  Does the patient want to be seen by Chronic Care Management for management of their diabetes?  No  Would the patient like to be referred to a Nutritionist or for Diabetic Management?  No   Diabetic Exams:  Diabetic Eye Exam: Completed 12/27/2019 Diabetic Foot Exam: Overdue, Pt has been advised about the importance in completing this exam. Pt is scheduled for diabetic foot exam on 06/23/2020.   Interpreter Needed?: No  Information entered by :: CJohnson, LPN   Activities of Daily Living In your present state of health, do you have any difficulty performing the following activities: 06/20/2020  Hearing? Y  Comment wears hearing aids  Vision? Y  Comment has glaucoma  Difficulty concentrating or making decisions? N  Walking or climbing stairs? N  Dressing or bathing? N  Doing errands, shopping? N  Preparing Food and eating ? N  Using the Toilet? N  In the past six months, have you accidently leaked urine? Y  Comment has leakage, but  doesn't wear pads.  Do you have problems with loss of bowel control? N  Managing your Medications? N  Managing your Finances? N  Housekeeping or managing your Housekeeping? N  Some recent data might be hidden  Patient Care Team: Pleas Koch, NP as PCP - General (Internal Medicine) Burnell Blanks, MD as PCP - Cardiology (Cardiology) Rana Snare, MD as Consulting Physician (Urology) Ladene Artist, MD as Consulting Physician (Gastroenterology) Thelma Comp, OD as Consulting Physician (Optometry)  Indicate any recent Medical Services you may have received from other than [facility] providers in the past year (date may be approximate).     Assessment:   This is a routine wellness examination for [facility]  Hearing/Vision screen  Hearing Screening   125Hz 250Hz 500Hz 1000Hz 2000Hz 3000Hz 4000Hz 6000Hz 8000Hz  Right ear:           Left ear:           Vision Screening Comments: Patient gets annual eye exams  Dietary issues and exercise activities discussed: Current Exercise Habits: Home exercise routine, Type of exercise: walking, Time (Minutes): 20, Frequency (Times/Week): 7, Weekly Exercise (Minutes/Week): 140, Intensity: Moderate, Exercise limited by: None identified  Goals    . Patient Stated     Starting 04/27/19, I will continue to take medications as prescribed.     . Patient Stated     06/20/2020, I will continue to walk daily for about 15-20 minutes.       Depression Screen PHQ 2/9 Scores 06/20/2020 04/27/2019 04/27/2019 08/02/2018 04/27/2017 03/22/2016 10/02/2012  PHQ - 2 Score 0 0 0 0 0 0 0  PHQ- 9 Score 0 0 0 - - - -    Fall Risk Fall Risk  06/20/2020 04/27/2019 08/02/2018 04/27/2017 03/22/2016  Falls in the past year? 0 1 No No No  Comment - fell in garage - - -  Number falls in past yr: 0 0 - - -  Injury with Fall? 0 1 - - -  Risk for fall due to : Medication side effect - - - -  Follow up Falls evaluation completed;Falls prevention discussed - - - -     Any stairs in or around the home? Yes  If so, are there any without handrails? No  Home free of loose throw rugs in walkways, pet beds, electrical cords, etc? Yes  Adequate lighting in your home to reduce risk of falls? Yes   ASSISTIVE DEVICES UTILIZED TO PREVENT FALLS:  Life alert? No  Use of a cane, walker or w/c? No  Grab bars in the bathroom? No  Shower chair or bench in shower? No  Elevated toilet seat or a handicapped toilet? No   TIMED UP AND GO:  Was the test performed? N/A, telephonic visit .   Cognitive Function: MMSE - Mini Mental State Exam 06/20/2020 04/27/2019 04/27/2017 03/22/2016  Orientation to time _0 Orientation to Place _1 Registration _2 Attention/ Calculation 5 0 0 0  Recall _3 Language- name 2 objects - 0 0 0  Language- repeat _4 Language- follow 3 step command - 0 3 3  Language- read & follow direction - 0 0 0  Write a sentence - 0 0 0  Copy design - 0 0 0  Total score - _5 Mini Cog  Mini-Cog screen was completed. Maximum score is 22. A value of 0 denotes this part of the MMSE was not completed or the patient failed this part of the Mini-Cog screening.       Immunizations Immunization History  Administered Date(s) Administered  . Influenza Split 09/14/2011, 08/28/2012  .  Influenza Whole 09/09/2004, 09/05/2006, 08/31/2007, 08/29/2008, 09/18/2009, 09/01/2010  . Influenza,inj,Quad PF,6+ Mos 08/26/2014, 08/06/2015, 09/14/2016, 09/08/2017, 08/02/2018, 08/16/2019  . PFIZER SARS-COV-2 Vaccination 01/16/2020, 02/06/2020  . Pneumococcal Conjugate-13 08/26/2014  . Pneumococcal Polysaccharide-23 09/09/2004, 12/25/2019  . Td 05/30/1999  . Zoster 04/15/2010    TDAP status: Due, Education has been provided regarding the importance of this vaccine. Advised may receive this vaccine at local pharmacy or Health Dept. Aware to provide a copy of the vaccination record if obtained from local pharmacy or Health Dept.  Verbalized acceptance and understanding. Flu Vaccine status: Up to date Pneumococcal vaccine status: Up to date Covid-19 vaccine status: Completed vaccines  Qualifies for Shingles Vaccine? Yes   Zostavax completed Yes   Shingrix Completed?: No.    Education has been provided regarding the importance of this vaccine. Patient has been advised to call insurance company to determine out of pocket expense if they have not yet received this vaccine. Advised may also receive vaccine at local pharmacy or Health Dept. Verbalized acceptance and understanding.  Screening Tests Health Maintenance  Topic Date Due  . FOOT EXAM  05/03/2019  . TETANUS/TDAP  06/20/2024 (Originally 05/29/2009)  . INFLUENZA VACCINE  06/29/2020  . HEMOGLOBIN A1C  12/20/2020  . OPHTHALMOLOGY EXAM  12/26/2020  . URINE MICROALBUMIN  12/26/2020  . MAMMOGRAM  04/18/2021  . DEXA SCAN  Completed  . COVID-19 Vaccine  Completed  . PNA vac Low Risk Adult  Completed    Health Maintenance  Health Maintenance Due  Topic Date Due  . FOOT EXAM  05/03/2019    Colorectal cancer screening: No longer required.  Mammogram status: Completed 04/18/2020. Repeat every year Bone Density status: Completed 07/03/2019. Results reflect: Bone density results: OSTEOPENIA. Repeat every 2 years.  Lung Cancer Screening: (Low Dose CT Chest recommended if Age 57-80 years, 30 pack-year currently smoking OR have quit w/in 15years.) does not qualify.    Additional Screening:  Hepatitis C Screening: does not qualify; Completed N/A  Vision Screening: Recommended annual ophthalmology exams for early detection of glaucoma and other disorders of the eye. Is the patient up to date with their annual eye exam?  Yes  Who is the provider or what is the name of the office in which the patient attends annual eye exams? [facility] If pt is not established with a provider, would they like to be referred to a provider to establish care? No .   Dental Screening:  Recommended annual dental exams for proper oral hygiene  Community Resource Referral / Chronic Care Management: CRR required this visit?  No   CCM required this visit?  No      Plan:     I have personally reviewed and noted the following in the patient's chart:   . Medical and social history . Use of alcohol, tobacco or illicit drugs  . Current medications and supplements . Functional ability and status . Nutritional status . Physical activity . Advanced directives . List of other physicians . Hospitalizations, surgeries, and ER visits in previous 12 months . Vitals . Screenings to include cognitive, depression, and falls . Referrals and appointments  In addition, I have reviewed and discussed with patient certain preventive protocols, quality metrics, and best practice recommendations. A written personalized care plan for preventive services as well as general preventive health recommendations were provided to patient.   Due to this being a telephonic visit, the after visit summary with patients personalized plan was offered to patient via mail or my-chart. Patient preferred  to pick up at office at next visit.   Andrez Grime, LPN   07/30/5175

## 2020-06-20 NOTE — Progress Notes (Signed)
PCP notes:  Health Maintenance: Foot exam- due   Abnormal Screenings: none   Patient concerns: Wants to discuss benefits of Prevagen  Right breast pain onset 1 year ago   Nurse concerns: none   Next PCP appt.: 06/23/2020 @ 10:20 am

## 2020-06-20 NOTE — Patient Instructions (Signed)
[name] [name] , Thank you for taking time to come for your Medicare Wellness Visit. I appreciate your ongoing commitment to your health goals. Please review the following plan we discussed and let me know if I can assist you in the future.   Screening recommendations/referrals: Colonoscopy: Up to date, completed 04/20/2011, no longer required Mammogram: Up to date, completed 04/18/2020, due 03/2021 Bone Density: Up to date, completed 07/03/2019, due 06/2021 Recommended yearly ophthalmology/optometry visit for glaucoma screening and checkup Recommended yearly dental visit for hygiene and checkup  Vaccinations: Influenza vaccine: Up to date, completed 08/16/2019, due 06/2020 Pneumococcal vaccine: Completed series Tdap vaccine: decline- insurance/financial Shingles vaccine: due, check with your insurance regarding coverage   Covid-19:Completed series  Advanced directives: Advance directive discussed with you today. Even though you declined this today please call our office should you change your mind and we can give you the proper paperwork for you to fill out.  Conditions/risks identified: diabetes  Next appointment: Follow up in one year for your annual wellness visit    Preventive Care 84 Years and Older, Female Preventive care refers to lifestyle choices and visits with your health care provider that can promote health and wellness. What does preventive care include?  A yearly physical exam. This is also called an annual well check.  Dental exams once or twice a year.  Routine eye exams. Ask your health care provider how often you should have your eyes checked.  Personal lifestyle choices, including:  Daily care of your teeth and gums.  Regular physical activity.  Eating a healthy diet.  Avoiding tobacco and drug use.  Limiting alcohol use.  Practicing safe sex.  Taking low-dose aspirin every day.  Taking vitamin and mineral supplements as recommended by your health care  provider. What happens during an annual well check? The services and screenings done by your health care provider during your annual well check will depend on your age, overall health, lifestyle risk factors, and family history of disease. Counseling  Your health care provider may ask you questions about your:  Alcohol use.  Tobacco use.  Drug use.  Emotional well-being.  Home and relationship well-being.  Sexual activity.  Eating habits.  History of falls.  Memory and ability to understand (cognition).  Work and work Statistician.  Reproductive health. Screening  You may have the following tests or measurements:  Height, weight, and BMI.  Blood pressure.  Lipid and cholesterol levels. These may be checked every 5 years, or more frequently if you are over 30 years old.  Skin check.  Lung cancer screening. You may have this screening every year starting at age 15 if you have a 30-pack-year history of smoking and currently smoke or have quit within the past 15 years.  Fecal occult blood test (FOBT) of the stool. You may have this test every year starting at age 84.  Flexible sigmoidoscopy or colonoscopy. You may have a sigmoidoscopy every 5 years or a colonoscopy every 10 years starting at age 39.  Hepatitis C blood test.  Hepatitis B blood test.  Sexually transmitted disease (STD) testing.  Diabetes screening. This is done by checking your blood sugar (glucose) after you have not eaten for a while (fasting). You may have this done every 1-3 years.  Bone density scan. This is done to screen for osteoporosis. You may have this done starting at age 84.  Mammogram. This may be done every 1-2 years. Talk to your health care provider about how often you should  have regular mammograms. Talk with your health care provider about your test results, treatment options, and if necessary, the need for more tests. Vaccines  Your health care provider may recommend certain  vaccines, such as:  Influenza vaccine. This is recommended every year.  Tetanus, diphtheria, and acellular pertussis (Tdap, Td) vaccine. You may need a Td booster every 10 years.  Zoster vaccine. You may need this after age 84.  Pneumococcal 13-valent conjugate (PCV13) vaccine. One dose is recommended after age 2.  Pneumococcal polysaccharide (PPSV23) vaccine. One dose is recommended after age 30. Talk to your health care provider about which screenings and vaccines you need and how often you need them. This information is not intended to replace advice given to you by your health care provider. Make sure you discuss any questions you have with your health care provider. Document Released: 12/12/2015 Document Revised: 08/04/2016 Document Reviewed: 09/16/2015 Elsevier Interactive Patient Education  2017 [place] Prevention in the Home Falls can cause injuries. They can happen to people of all ages. There are many things you can do to make your home safe and to help prevent falls. What can I do on the outside of my home?  Regularly fix the edges of walkways and driveways and fix any cracks.  Remove anything that might make you trip as you walk through a door, such as a raised step or threshold.  Trim any bushes or trees on the path to your home.  Use bright outdoor lighting.  Clear any walking paths of anything that might make someone trip, such as rocks or tools.  Regularly check to see if handrails are loose or broken. Make sure that both sides of any steps have handrails.  Any raised decks and porches should have guardrails on the edges.  Have any leaves, snow, or ice cleared regularly.  Use sand or salt on walking paths during winter.  Clean up any spills in your garage right away. This includes oil or grease spills. What can I do in the bathroom?  Use night lights.  Install grab bars by the toilet and in the tub and shower. Do not use towel bars as grab  bars.  Use non-skid mats or decals in the tub or shower.  If you need to sit down in the shower, use a plastic, non-slip stool.  Keep the floor dry. Clean up any water that spills on the floor as soon as it happens.  Remove soap buildup in the tub or shower regularly.  Attach bath mats securely with double-sided non-slip rug tape.  Do not have throw rugs and other things on the floor that can make you trip. What can I do in the bedroom?  Use night lights.  Make sure that you have a light by your bed that is easy to reach.  Do not use any sheets or blankets that are too big for your bed. They should not hang down onto the floor.  Have a firm chair that has side arms. You can use this for support while you get dressed.  Do not have throw rugs and other things on the floor that can make you trip. What can I do in the kitchen?  Clean up any spills right away.  Avoid walking on wet floors.  Keep items that you use a lot in easy-to-reach places.  If you need to reach something above you, use a strong step stool that has a grab bar.  Keep electrical cords out of  the way.  Do not use floor polish or wax that makes floors slippery. If you must use wax, use non-skid floor wax.  Do not have throw rugs and other things on the floor that can make you trip. What can I do with my stairs?  Do not leave any items on the stairs.  Make sure that there are handrails on both sides of the stairs and use them. Fix handrails that are broken or loose. Make sure that handrails are as long as the stairways.  Check any carpeting to make sure that it is firmly attached to the stairs. Fix any carpet that is loose or worn.  Avoid having throw rugs at the top or bottom of the stairs. If you do have throw rugs, attach them to the floor with carpet tape.  Make sure that you have a light switch at the top of the stairs and the bottom of the stairs. If you do not have them, ask someone to add them for  you. What else can I do to help prevent falls?  Wear shoes that:  Do not have high heels.  Have rubber bottoms.  Are comfortable and fit you well.  Are closed at the toe. Do not wear sandals.  If you use a stepladder:  Make sure that it is fully opened. Do not climb a closed stepladder.  Make sure that both sides of the stepladder are locked into place.  Ask someone to hold it for you, if possible.  Clearly mark and make sure that you can see:  Any grab bars or handrails.  First and last steps.  Where the edge of each step is.  Use tools that help you move around (mobility aids) if they are needed. These include:  Canes.  Walkers.  Scooters.  Crutches.  Turn on the lights when you go into a dark area. Replace any light bulbs as soon as they burn out.  Set up your furniture so you have a clear path. Avoid moving your furniture around.  If any of your floors are uneven, fix them.  If there are any pets around you, be aware of where they are.  Review your medicines with your doctor. Some medicines can make you feel dizzy. This can increase your chance of falling. Ask your doctor what other things that you can do to help prevent falls. This information is not intended to replace advice given to you by your health care provider. Make sure you discuss any questions you have with your health care provider. Document Released: 09/11/2009 Document Revised: 04/22/2016 Document Reviewed: 12/20/2014 Elsevier Interactive Patient Education  2017 [employer].

## 2020-06-23 ENCOUNTER — Ambulatory Visit (INDEPENDENT_AMBULATORY_CARE_PROVIDER_SITE_OTHER): Payer: Medicare PPO | Admitting: Primary Care

## 2020-06-23 ENCOUNTER — Encounter: Payer: Self-pay | Admitting: Primary Care

## 2020-06-23 ENCOUNTER — Other Ambulatory Visit: Payer: Self-pay

## 2020-06-23 VITALS — BP 104/60 | HR 51 | Temp 96.2°F | Ht 68.0 in | Wt 169.2 lb

## 2020-06-23 DIAGNOSIS — E1100 Type 2 diabetes mellitus with hyperosmolarity without nonketotic hyperglycemic-hyperosmolar coma (NKHHC): Secondary | ICD-10-CM | POA: Diagnosis not present

## 2020-06-23 DIAGNOSIS — I2510 Atherosclerotic heart disease of native coronary artery without angina pectoris: Secondary | ICD-10-CM | POA: Diagnosis not present

## 2020-06-23 DIAGNOSIS — K219 Gastro-esophageal reflux disease without esophagitis: Secondary | ICD-10-CM | POA: Diagnosis not present

## 2020-06-23 DIAGNOSIS — Z0000 Encounter for general adult medical examination without abnormal findings: Secondary | ICD-10-CM

## 2020-06-23 DIAGNOSIS — K589 Irritable bowel syndrome without diarrhea: Secondary | ICD-10-CM

## 2020-06-23 DIAGNOSIS — E2839 Other primary ovarian failure: Secondary | ICD-10-CM | POA: Diagnosis not present

## 2020-06-23 DIAGNOSIS — E782 Mixed hyperlipidemia: Secondary | ICD-10-CM

## 2020-06-23 DIAGNOSIS — M8589 Other specified disorders of bone density and structure, multiple sites: Secondary | ICD-10-CM | POA: Diagnosis not present

## 2020-06-23 DIAGNOSIS — Z6379 Other stressful life events affecting family and household: Secondary | ICD-10-CM

## 2020-06-23 DIAGNOSIS — Z23 Encounter for immunization: Secondary | ICD-10-CM

## 2020-06-23 DIAGNOSIS — R600 Localized edema: Secondary | ICD-10-CM

## 2020-06-23 DIAGNOSIS — R5382 Chronic fatigue, unspecified: Secondary | ICD-10-CM

## 2020-06-23 DIAGNOSIS — I10 Essential (primary) hypertension: Secondary | ICD-10-CM

## 2020-06-23 DIAGNOSIS — N289 Disorder of kidney and ureter, unspecified: Secondary | ICD-10-CM

## 2020-06-23 DIAGNOSIS — N390 Urinary tract infection, site not specified: Secondary | ICD-10-CM

## 2020-06-23 DIAGNOSIS — R7989 Other specified abnormal findings of blood chemistry: Secondary | ICD-10-CM

## 2020-06-23 DIAGNOSIS — E538 Deficiency of other specified B group vitamins: Secondary | ICD-10-CM

## 2020-06-23 LAB — BASIC METABOLIC PANEL
BUN: 22 mg/dL (ref 6–23)
CO2: 29 mEq/L (ref 19–32)
Calcium: 9.4 mg/dL (ref 8.4–10.5)
Chloride: 101 mEq/L (ref 96–112)
Creatinine, Ser: 1.4 mg/dL — ABNORMAL HIGH (ref 0.40–1.20)
GFR: 35.62 mL/min — ABNORMAL LOW (ref >60.00)
Glucose, Bld: 229 mg/dL — ABNORMAL HIGH (ref 70–99)
Potassium: 4.9 mEq/L (ref 3.5–5.1)
Sodium: 136 mEq/L (ref 135–145)

## 2020-06-23 MED ORDER — ZOSTER VAC RECOMB ADJUVANTED 50 MCG/0.5ML IM SUSR: 0.5000 mL | Freq: Once | INTRAMUSCULAR | 1 refills | Status: AC

## 2020-06-23 NOTE — Assessment & Plan Note (Signed)
Improved on Zoloft 25 mg, but not at goal. Increase dose of Zoloft to 50 mg. She will update in a month.

## 2020-06-23 NOTE — Assessment & Plan Note (Signed)
Well controlled on oral B12. Continue same.

## 2020-06-23 NOTE — Assessment & Plan Note (Signed)
Rx for Shingrix provided. Other immunizations UTD. Mammogram UTD. Bone density scan due. Colonoscopy N/A given age.  Encouraged weight bearing exercise. Exam today stable. Labs reviewed.

## 2020-06-23 NOTE — Assessment & Plan Note (Signed)
Overall doing better on furosemide for which she takes 2-3 times weekly on average. Recent CMP reviewed. Continue same.

## 2020-06-23 NOTE — Assessment & Plan Note (Addendum)
Slightly increased on recently labs. Encouraged dietary changes. Continue to monitor. Repeat in 3 months.

## 2020-06-23 NOTE — Progress Notes (Signed)
Subjective:    Patient ID: [name] [name], female    DOB: [birth year], 86 y.o.   MRN: [id number]  HPI  This visit occurred during the SARS-CoV-2 public health emergency.  Safety protocols were in place, including screening questions prior to the visit, additional usage of staff PPE, and extensive cleaning of exam room while observing appropriate contact time as indicated for disinfecting solutions.   [name] [name] is a 86 year old female who presents today for complete physical.  Immunizations: -Tetanus: Completed in 2000 -Influenza: Due this season  -Shingles: Completed Zoster in 2011 -Pneumonia: Completed Prevnar in 2015, Pneumovax in 2021 -Covid-19: Completed series  Diet: She endorses a fair diet.  Exercise: She walks and is active at home.   Eye exam: UTD Dental exam:  Follows regularly   Mammogram: Completed in 2021 Dexa: Completed in 2020, due this year. Osteopenia with significant change. Colonoscopy: Completed in 2012 Hep C Screen: Negative  BP Readings from Last 3 Encounters:  06/23/20 (!) 104/60  05/05/20 122/64  04/24/20 116/66     Review of Systems  Constitutional: Negative for unexpected weight change.  HENT: Negative for rhinorrhea.   Eyes: Negative for visual disturbance.  Respiratory: Negative for cough and shortness of breath.   Cardiovascular: Negative for chest pain.  Gastrointestinal: Negative for constipation and diarrhea.  Genitourinary: Negative for difficulty urinating.  Musculoskeletal: Positive for arthralgias and back pain.  Skin: Negative for rash.  Allergic/Immunologic: Negative for environmental allergies.  Neurological: Positive for numbness. Negative for dizziness.  Hematological: Negative for adenopathy.  Psychiatric/Behavioral:       Continued stress, Zoloft helping some, but feels like she may need a dose increase       Past Medical History:  Diagnosis Date  . Acute bilateral thoracic back pain 08/16/2019  . Allergy    Cipro,  Plavix, Statins  . CAD (coronary artery disease)    a. s/p tandem Promus DES to LAD in 2009 by Dr. Olevia Perches;  b.  LHC (03/22/14):  prox LAD 30%, mid LAD 40%, LAD stent ok with dist 20% ISR, apical LAD occluded with L-L collats filling apical vessel (too small for PCI), mid RCA 20%, EF 70%.  Med Rx  . Diabetes mellitus    Diagnosed 2012  . Diverticulosis of colon (without mention of hemorrhage)   . GERD (gastroesophageal reflux disease)   . Herpes zoster without complication 03/15/4080  . Hx of cardiovascular stress test    a. Nuclear (08/2013):  No ischemia, EF 80%, Normal  . Hx of echocardiogram    a. Echo (07/2013):  Mild LVH, vigorous LVF, EF 65-70%, Gr 1 DD, mild MR, mild to mod LAE  . Hyperlipidemia    intol of statins  . Hypertension   . NHL (non-Hodgkin's lymphoma) ([place]) dx'd 2003   chemo/xrt comp 2003  . Personal history of colonic polyps   . Proctitis   . Pruritic intertrigo 07/18/2014  . Urticaria      Social History   Socioeconomic History  . Marital status: Married    Spouse name: Not on file  . Number of children: Not on file  . Years of education: Not on file  . Highest education level: Not on file  Occupational History  . Occupation: Retired    Fish farm manager: RETIRED  Tobacco Use  . Smoking status: Never Smoker  . Smokeless tobacco: Never Used  Vaping Use  . Vaping Use: Never used  Substance and Sexual Activity  . Alcohol use: No  .  Drug use: No  . Sexual activity: Not Currently  Other Topics Concern  . Not on file  Social History Narrative  . Not on file   Social Determinants of Health   Financial Resource Strain: Low Risk   . Difficulty of Paying Living Expenses: Not hard at all  Food Insecurity: No Food Insecurity  . Worried About Charity fundraiser in the Last Year: Never true  . Ran Out of Food in the Last Year: Never true  Transportation Needs: No Transportation Needs  . Lack of Transportation (Medical): No  . Lack of Transportation (Non-Medical):  No  Physical Activity: Insufficiently Active  . Days of Exercise per Week: 7 days  . Minutes of Exercise per Session: 20 min  Stress: No Stress Concern Present  . Feeling of Stress : Not at all  Social Connections:   . Frequency of Communication with Friends and Family:   . Frequency of Social Gatherings with Friends and Family:   . Attends Religious Services:   . Active Member of Clubs or Organizations:   . Attends Archivist Meetings:   Marland Kitchen Marital Status:   Intimate Partner Violence: Not At Risk  . Fear of Current or Ex-Partner: No  . Emotionally Abused: No  . Physically Abused: No  . Sexually Abused: No    Past Surgical History:  Procedure Laterality Date  . cardiac cath-neg    . CORONARY ANGIOPLASTY WITH STENT PLACEMENT     x 2  . dexa-neg    . KNEE SURGERY  10/2003   left  . laser surgery for cataracts-left    . LEFT HEART CATHETERIZATION WITH CORONARY ANGIOGRAM N/A 03/22/2014   Procedure: LEFT HEART CATHETERIZATION WITH CORONARY ANGIOGRAM;  Surgeon: Burnell Blanks, MD;  Location: [facility] C[facility];  Service: Cardiovascular;  Laterality: N/A;  . stress cardiolite    . VAGINAL HYSTERECTOMY     partial, fibroids, one ovary left    Family History  Problem Relation Age of Onset  . Cancer Mother        jaw  . Hypertension Father   . Heart attack Father   . Heart attack Sister   . Colon cancer Neg Hx     Allergies  Allergen Reactions  . Other Rash and Anaphylaxis  . Shellfish Allergy Anaphylaxis and Rash  . Cephalexin     REACTION: trash and swelling REACTION: trash and swelling  . Ciprofloxacin     REACTION: u/k  . Clopidogrel Bisulfate     REACTION: swelling, rash  . Codeine Phosphate     REACTION: unspecified  . Ezetimibe-Simvastatin     REACTION: myalgias REACTION: myalgias  . Hydrocod Polst-Cpm Polst Er     REACTION: u/k  . Lidocaine Hives    ONLY TO ADHESIVE LIDOCAINE PATCHES. NO PROBLEM WITH INJECTABLE LIDOCAINE.  Marland Kitchen Nitrofurantoin      REACTION: [place] REACTION: [place]  . Pregabalin     REACTION: felt bad REACTION: felt bad  . Propoxyphene N-Acetaminophen     REACTION: [place]  . Rofecoxib     unk unk   . Sulfamethoxazole     REACTION: unspecified  . Sulfonamide Derivatives     REACTION: unspecified  . Celecoxib Rash    REACTION: [place] REACTION: [place]  . Codeine Rash    REACTION: [place] REACTION: [place]  . Hydrocod Polst-Cpm Polst Er Rash  . Propoxyphene Rash  . Sulfa Antibiotics Rash    REACTION: unspecified REACTION: unspecified    Current Outpatient Medications on  File Prior to Visit  Medication Sig Dispense Refill  . Accu-Chek Softclix Lancets lancets Use as instructed to test blood sugar daily E11.9 100 each 5  . aspirin (ASPIRIN LOW DOSE) 81 MG tablet Take 81 mg by mouth daily.      . Blood Glucose Monitoring Suppl (BLOOD GLUCOSE MONITOR SYSTEM) w/Device KIT Use to test blood sugar up to 4 times daily Dx E11.9 1 kit 0  . brimonidine-timolol (COMBIGAN) 0.2-0.5 % ophthalmic solution Place 1 drop into both eyes every 12 (twelve) hours.    . Cholecalciferol (VITAMIN D-3) 1000 UNITS CAPS Take 1 capsule by mouth daily.    Marland Kitchen ezetimibe (ZETIA) 10 MG tablet TAKE ONE TABLET BY MOUTH DAILY 90 tablet 3  . fexofenadine (ALLEGRA) 180 MG tablet Take 1 tablet (180 mg total) by mouth as needed for allergies or rhinitis. 90 tablet 0  . furosemide (LASIX) 20 MG tablet TAKE ONE TABLET BY MOUTH DAILY FOR LEG SWELLING 90 tablet 0  . gabapentin (NEURONTIN) 100 MG capsule Take 1 capsule (100 mg total) by mouth 3 (three) times daily. 90 capsule 3  . glipiZIDE (GLUCOTROL XL) 10 MG 24 hr tablet TAKE ONE TABLET BY MOUTH EVERY MORNING WITH BREAKFAST 90 tablet 1  . glucose blood (ACCU-CHEK GUIDE) test strip Use as instructed to test blood sugar up to 4 times daily Dx E11.9 100 each 5  . isosorbide mononitrate (IMDUR) 60 MG 24 hr tablet TAKE ONE TABLET BY MOUTH DAILY 90 tablet 1  . metoprolol succinate (TOPROL-XL) 100 MG 24 hr tablet TAKE ONE TABLET BY  MOUTH EVERY MORNING WITH OR IMMEDIATELY FOLLOWING A MEAL 90 tablet 2  . nitroGLYCERIN (NITROSTAT) 0.4 MG SL tablet Place 1 tablet (0.4 mg total) under the tongue every 5 (five) minutes as needed for chest pain. 26 tablet 0  . OVER THE COUNTER MEDICATION Take 1 tablet by mouth daily. Med name: VISION FORMULA    . pantoprazole (PROTONIX) 40 MG tablet TAKE ONE TABLET BY MOUTH DAILY FOR HEARTBURN 90 tablet 1  . sertraline (ZOLOFT) 25 MG tablet TAKE ONE TABLET BY MOUTH DAILY FOR STRESS 90 tablet 1  . timolol (TIMOPTIC) 0.5 % ophthalmic solution     . trimethoprim (TRIMPEX) 100 MG tablet Take 100 mg by mouth 2 (two) times daily as needed (UTI).     . vitamin B-12 (CYANOCOBALAMIN) 1000 MCG tablet Take 1,000 mcg by mouth daily.    . [DISCONTINUED] loratadine (CLARITIN) 10 MG tablet Take 10 mg by mouth as needed.      No current facility-administered medications on file prior to visit.    BP (!) 104/60   Pulse 51   Temp (!) 96.2 F (35.7 C) (Temporal)   Ht _0  (1.727 m)   Wt 169 lb 4 oz (76.8 kg)   SpO2 97%   BMI 25.73 kg/m    Objective:   Physical Exam HENT:     Right Ear: Tympanic membrane and ear canal normal.     Left Ear: Tympanic membrane and ear canal normal.  Eyes:     Pupils: Pupils are equal, round, and reactive to light.  Cardiovascular:     Rate and Rhythm: Normal rate and regular rhythm.  Pulmonary:     Effort: Pulmonary effort is normal.     Breath sounds: Normal breath sounds.  Abdominal:     General: Bowel sounds are normal.     Palpations: Abdomen is soft.     Tenderness: There is no abdominal tenderness.  Musculoskeletal:        General: Normal range of motion.     Cervical back: Neck supple.  Skin:    General: Skin is warm and dry.  Neurological:     Mental Status: She is alert and oriented to person, place, and time.     Cranial Nerves: No cranial nerve deficit.     Deep Tendon Reflexes:     Reflex Scores:      Patellar reflexes are 2+ on the right side  and 2+ on the left side. Psychiatric:        Mood and Affect: Mood normal.            Assessment & Plan:

## 2020-06-23 NOTE — Assessment & Plan Note (Signed)
Chronic and continued. 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  Increasing Zoloft to help with stress. Continue to monitor.

## 2020-06-23 NOTE — Assessment & Plan Note (Signed)
Chronic and stable Continue to monitor 

## 2020-06-23 NOTE — Assessment & Plan Note (Signed)
Increase of A1C to 8.1 on recent labs from 7.3 several months ago. She admits to eating more starchy food.  Discussed the option of adding another oral agent vs working on dietary changes, she would like to work on dietary changes which seems reasonable.   Repeat A1C in 3 months, if above goal then add in [place]. Continue Glipizide daily.

## 2020-06-23 NOTE — Assessment & Plan Note (Signed)
Due for repeat bone density scan which is noted from report from 2020. Orders placed.

## 2020-06-23 NOTE — Assessment & Plan Note (Signed)
LDL stable. Cannot tolerate statin therapy. Continue Zetia.

## 2020-06-23 NOTE — Assessment & Plan Note (Signed)
No recent UTI. Doing well on trimethoprim for which she is getting from Urology. Taking PRN. Continue same.

## 2020-06-23 NOTE — Assessment & Plan Note (Signed)
Doing well on pantoprazole, continue same. 

## 2020-06-23 NOTE — Assessment & Plan Note (Signed)
On the lower side today, home readings are higher but within normal range. Continue to monitor. Continue current regimen.

## 2020-06-23 NOTE — Assessment & Plan Note (Signed)
Denies concerns today.  

## 2020-06-23 NOTE — Assessment & Plan Note (Signed)
LDL stable from recent labs. Statin intolerant.  Continue Zetia.

## 2020-06-23 NOTE — Patient Instructions (Addendum)
Work on [employer] in order to reduce your A1C level.  You may increase the dose of your sertraline (Zoloft) to 50 mg. You may take two tablets to make 50 mg since you had your bottle refilled.  Continue exercising. You should be getting 150 minutes of exercise weekly.  Take the Shingles vaccine to your pharmacy.   Schedule a lab appointment for 3 months for diabetes check. Please update me in one month regarding your sertraline (Zoloft) dose and stress.  It was a pleasure to see you today!   Diabetes Mellitus and Nutrition, Adult When you have diabetes (diabetes mellitus), it is very important to have healthy eating habits because your blood sugar (glucose) levels are greatly affected by what you eat and drink. Eating healthy foods in the appropriate amounts, at about the same times every day, can help you:  Control your blood glucose.  Lower your risk of heart disease.  Improve your blood pressure.  Reach or maintain a healthy weight. Every person with diabetes is different, and each person has different needs for a meal plan. Your health care provider may recommend that you work with a diet and nutrition specialist (dietitian) to make a meal plan that is best for you. Your meal plan may vary depending on factors such as:  The calories you need.  The medicines you take.  Your weight.  Your blood glucose, blood pressure, and cholesterol levels.  Your activity level.  Other health conditions you have, such as heart or kidney disease. How do carbohydrates affect me? Carbohydrates, also called carbs, affect your blood glucose level more than any other type of food. Eating carbs naturally raises the amount of glucose in your blood. Carb counting is a method for keeping track of how many carbs you eat. Counting carbs is important to keep your blood glucose at a healthy level, especially if you use insulin or take certain oral diabetes medicines. It is important to know how many  carbs you can safely have in each meal. This is different for every person. Your dietitian can help you calculate how many carbs you should have at each meal and for each snack. Foods that contain carbs include:  Bread, cereal, rice, pasta, and crackers.  Potatoes and corn.  Peas, beans, and lentils.  Milk and yogurt.  Fruit and juice.  Desserts, such as cakes, cookies, ice cream, and candy. How does alcohol affect me? Alcohol can cause a sudden decrease in blood glucose (hypoglycemia), especially if you use insulin or take certain oral diabetes medicines. Hypoglycemia can be a life-threatening condition. Symptoms of hypoglycemia (sleepiness, dizziness, and confusion) are similar to symptoms of having too much alcohol. If your health care provider says that alcohol is safe for you, follow these guidelines:  Limit alcohol intake to no more than 1 drink per day for nonpregnant women and 2 drinks per day for men. One drink equals 12 oz of beer, 5 oz of wine, or 1 oz of hard liquor.  Do not drink on an empty stomach.  Keep yourself hydrated with water, diet soda, or unsweetened iced tea.  Keep in mind that regular soda, juice, and other mixers may contain a lot of sugar and must be counted as carbs. What are tips for following this plan?  Reading food labels  Start by checking the serving size on the "Nutrition Facts" label of packaged foods and drinks. The amount of calories, carbs, fats, and other nutrients listed on the label is based on  one serving of the item. Many items contain more than one serving per package.  Check the total grams (g) of carbs in one serving. You can calculate the number of servings of carbs in one serving by dividing the total carbs by 15. For example, if a food has 30 g of total carbs, it would be equal to 2 servings of carbs.  Check the number of grams (g) of saturated and trans fats in one serving. Choose foods that have low or no amount of these  fats.  Check the number of milligrams (mg) of salt (sodium) in one serving. Most people should limit total sodium intake to less than 2,300 mg per day.  Always check the nutrition information of foods labeled as "low-fat" or "nonfat". These foods may be higher in added sugar or refined carbs and should be avoided.  Talk to your dietitian to identify your daily goals for nutrients listed on the label. Shopping  Avoid buying canned, premade, or processed foods. These foods tend to be high in fat, sodium, and added sugar.  Shop around the outside edge of the grocery store. This includes fresh fruits and vegetables, bulk grains, fresh meats, and fresh dairy. Cooking  Use low-heat cooking methods, such as baking, instead of high-heat cooking methods like deep frying.  Cook using healthy oils, such as olive, canola, or sunflower oil.  Avoid cooking with butter, cream, or high-fat meats. Meal planning  Eat meals and snacks regularly, preferably at the same times every day. Avoid going long periods of time without eating.  Eat foods high in fiber, such as fresh fruits, vegetables, beans, and whole grains. Talk to your dietitian about how many servings of carbs you can eat at each meal.  Eat 4-6 ounces (oz) of lean protein each day, such as lean meat, chicken, fish, eggs, or tofu. One oz of lean protein is equal to: ? 1 oz of meat, chicken, or fish. ? 1 egg. ?  cup of tofu.  Eat some foods each day that contain healthy fats, such as avocado, nuts, seeds, and fish. Lifestyle  Check your blood glucose regularly.  Exercise regularly as told by your health care provider. This may include: ? 150 minutes of moderate-intensity or vigorous-intensity exercise each week. This could be brisk walking, biking, or water aerobics. ? Stretching and doing strength exercises, such as yoga or weightlifting, at least 2 times a week.  Take medicines as told by your health care provider.  Do not use any  products that contain nicotine or tobacco, such as cigarettes and e-cigarettes. If you need help quitting, ask your health care provider.  Work with a Social worker or diabetes educator to identify strategies to manage stress and any emotional and social challenges. Questions to ask a health care provider  Do I need to meet with a diabetes educator?  Do I need to meet with a dietitian?  What number can I call if I have questions?  When are the best times to check my blood glucose? Where to find more information:  American Diabetes Association: diabetes.org  Academy of Nutrition and Dietetics: www.eatright.[employer] of Diabetes and Digestive and Kidney Diseases (NIH): [employer] Summary  A healthy meal plan will help you control your blood glucose and maintain a healthy lifestyle.  Working with a diet and nutrition specialist (dietitian) can help you make a meal plan that is best for you.  Keep in mind that carbohydrates (carbs) and alcohol have immediate effects on your  blood glucose levels. It is important to count carbs and to use alcohol carefully. This information is not intended to replace advice given to you by your health care provider. Make sure you discuss any questions you have with your health care provider. Document Revised: 10/28/2017 Document Reviewed: 12/20/2016 Elsevier Patient Education  2020 [employer].

## 2020-06-27 ENCOUNTER — Telehealth: Payer: Self-pay

## 2020-06-27 NOTE — Telephone Encounter (Signed)
Pt had appt on 06/23/20 and was offered a new DM to add on or try dietary changes and pt opted to try diet. Pt said last night she ate sesame chicken with vegetables and a sweet tasting sauce.2 hrs after supper pts BS was 310. This morning FBS was 139  Pt ate boiled egg, toast and coffee and 2 hr after eating breakfast BS was 230. Pt is taking glipizide as instructed but pt is concerned with elevated BS over 300 last night. Pt request Anda Kraft to review this note and if Anda Kraft thinks best for pt to add on another DM med pt is willing to do so. Pt is not having and DM symptoms, sweats,shakiness,etc and pt is drinking a lot of water.[employer]. Pt request cb.

## 2020-06-27 NOTE — Telephone Encounter (Signed)
Spoken and notified patient of Kate [name]'s comments. Verbalized understanding. ° °

## 2020-06-27 NOTE — Telephone Encounter (Signed)
Left message with patient's husband to have patient's return my call

## 2020-06-27 NOTE — Telephone Encounter (Signed)
I'd like to learn more glucose readings.  Have her monitor her glucose 2 times daily for the next 3 weeks. Have her call us at that time with an update.  In the meantime, watch diet. Increase healthy vegetables, lean protein. Limit breads/sweets/pasta/rice.

## 2020-06-30 DIAGNOSIS — H16223 Keratoconjunctivitis sicca, not specified as Sjogren's, bilateral: Secondary | ICD-10-CM | POA: Diagnosis not present

## 2020-06-30 DIAGNOSIS — H16143 Punctate keratitis, bilateral: Secondary | ICD-10-CM | POA: Diagnosis not present

## 2020-06-30 DIAGNOSIS — H04123 Dry eye syndrome of bilateral lacrimal glands: Secondary | ICD-10-CM | POA: Diagnosis not present

## 2020-07-01 DIAGNOSIS — D489 Neoplasm of uncertain behavior, unspecified: Secondary | ICD-10-CM | POA: Diagnosis not present

## 2020-07-01 DIAGNOSIS — L821 Other seborrheic keratosis: Secondary | ICD-10-CM | POA: Diagnosis not present

## 2020-07-15 ENCOUNTER — Encounter: Payer: Self-pay | Admitting: Primary Care

## 2020-07-15 DIAGNOSIS — M8589 Other specified disorders of bone density and structure, multiple sites: Secondary | ICD-10-CM | POA: Diagnosis not present

## 2020-07-15 DIAGNOSIS — Z13820 Encounter for screening for osteoporosis: Secondary | ICD-10-CM | POA: Diagnosis not present

## 2020-07-15 DIAGNOSIS — Z85038 Personal history of other malignant neoplasm of large intestine: Secondary | ICD-10-CM | POA: Diagnosis not present

## 2020-07-25 DIAGNOSIS — J301 Allergic rhinitis due to pollen: Secondary | ICD-10-CM | POA: Diagnosis not present

## 2020-07-25 DIAGNOSIS — J019 Acute sinusitis, unspecified: Secondary | ICD-10-CM | POA: Diagnosis not present

## 2020-07-29 NOTE — Telephone Encounter (Signed)
Pt called in with an update. She states she has kept a log for all of August and it has been running from 132-165 normally. And then she takes it again about 2 hours after she eats and it ranges from 116-231 with most readings in the 170's and 180's.

## 2020-07-29 NOTE — Telephone Encounter (Signed)
Spoke with patient regarding results.  She is going to continue to monitor and follow up in October as directed.

## 2020-07-29 NOTE — Telephone Encounter (Signed)
If she feels that these readings have improved with dietary changes, then it's reasonable to have her continue to work on lifestyle changes, and [place] recheck her A1C in late October 2021.

## 2020-08-11 ENCOUNTER — Other Ambulatory Visit: Payer: Self-pay

## 2020-08-11 MED ORDER — ISOSORBIDE MONONITRATE ER 60 MG PO TB24: 60.0000 mg | ORAL_TABLET | Freq: Every day | ORAL | 2 refills | Status: DC

## 2020-09-01 DIAGNOSIS — Z83511 Family history of glaucoma: Secondary | ICD-10-CM | POA: Diagnosis not present

## 2020-09-01 DIAGNOSIS — H401133 Primary open-angle glaucoma, bilateral, severe stage: Secondary | ICD-10-CM | POA: Diagnosis not present

## 2020-09-08 ENCOUNTER — Other Ambulatory Visit: Payer: Self-pay | Admitting: Primary Care

## 2020-09-08 DIAGNOSIS — R600 Localized edema: Secondary | ICD-10-CM

## 2020-09-10 ENCOUNTER — Other Ambulatory Visit: Payer: Self-pay | Admitting: Primary Care

## 2020-09-10 DIAGNOSIS — N289 Disorder of kidney and ureter, unspecified: Secondary | ICD-10-CM

## 2020-09-10 DIAGNOSIS — E1100 Type 2 diabetes mellitus with hyperosmolarity without nonketotic hyperglycemic-hyperosmolar coma (NKHHC): Secondary | ICD-10-CM

## 2020-09-11 ENCOUNTER — Ambulatory Visit: Payer: Medicare PPO | Admitting: Primary Care

## 2020-09-11 ENCOUNTER — Other Ambulatory Visit: Payer: Self-pay

## 2020-09-11 ENCOUNTER — Encounter: Payer: Self-pay | Admitting: Primary Care

## 2020-09-11 VITALS — BP 108/62 | HR 62 | Temp 98.6°F | Ht 68.0 in | Wt 167.0 lb

## 2020-09-11 DIAGNOSIS — Z6379 Other stressful life events affecting family and household: Secondary | ICD-10-CM

## 2020-09-11 DIAGNOSIS — R229 Localized swelling, mass and lump, unspecified: Secondary | ICD-10-CM | POA: Diagnosis not present

## 2020-09-11 DIAGNOSIS — E119 Type 2 diabetes mellitus without complications: Secondary | ICD-10-CM | POA: Diagnosis not present

## 2020-09-11 LAB — COMPREHENSIVE METABOLIC PANEL
ALT: 29 U/L (ref 0–35)
AST: 36 U/L (ref 0–37)
Albumin: 4 g/dL (ref 3.5–5.2)
Alkaline Phosphatase: 44 U/L (ref 39–117)
BUN: 19 mg/dL (ref 6–23)
CO2: 30 mEq/L (ref 19–32)
Calcium: 9.1 mg/dL (ref 8.4–10.5)
Chloride: 101 mEq/L (ref 96–112)
Creatinine, Ser: 1.3 mg/dL — ABNORMAL HIGH (ref 0.40–1.20)
GFR: 36.96 mL/min — ABNORMAL LOW (ref >60.00)
Glucose, Bld: 192 mg/dL — ABNORMAL HIGH (ref 70–99)
Potassium: 4.3 mEq/L (ref 3.5–5.1)
Sodium: 137 mEq/L (ref 135–145)
Total Bilirubin: 0.7 mg/dL (ref 0.2–1.2)
Total Protein: 6.8 g/dL (ref 6.0–8.3)

## 2020-09-11 LAB — HEMOGLOBIN A1C: Hgb A1c MFr Bld: 7.6 % — ABNORMAL HIGH (ref 4.6–6.5)

## 2020-09-11 MED ORDER — SERTRALINE HCL 50 MG PO TABS: 50.0000 mg | ORAL_TABLET | Freq: Every day | ORAL | 3 refills | Status: DC

## 2020-09-11 NOTE — Assessment & Plan Note (Signed)
Repeat A1C pending, overall glucose readings appear to be decent.

## 2020-09-11 NOTE — Assessment & Plan Note (Signed)
Improved with increased dose of Zoloft to 50 mg, new Rx sent to pharmacy.

## 2020-09-11 NOTE — Progress Notes (Signed)
Subjective:    Patient ID: [name] [name], female    DOB: 08/06/1932, 86 y.o.   MRN: [id number]  HPI  This visit occurred during the SARS-CoV-2 public health emergency.  Safety protocols were in place, including screening questions prior to the visit, additional usage of staff PPE, and extensive cleaning of exam room while observing appropriate contact time as indicated for disinfecting solutions.   [name] [name] is a 86 year old female with a significant medical history including hypertension, type 2 diabetes, recurrent UTI, CKD, chronic fatigue, bilateral lower extremity edema who presents today with a chief complaint of skin mass. She is also needing repeat A1C and a refill of her Zoloft.  She first noticed the skin mass about 2 weeks ago which is located to the right nasal bridge. The mass has grown slightly in size. She denies changes in color and shape, erythema, pain. She called her dermatologist who could not see her until April 2022. She notified her dermatology office that she would like to attempt to "pop" the spot, and they referred her to our office.  She has been taking two of her sertraline 25 mg tablets for the last 2 months due to increased stress at home with her terminally ill husband. She is ready to change to the 50 mg dose.   She is also due for repeat LFT's and A1C. She's been checking her glucose twice daily and is getting readings ranging low 100's to mid 200's, mostly mid 100's. Her lowest reading was 88 once. She is compliant to her regimen and is working on her diet.   Review of Systems  Cardiovascular: Negative for chest pain.  Skin: Negative for color change.       Skin mass  Psychiatric/Behavioral:       Improved anxiety on 50 mg dose of sertraline.        Past Medical History:  Diagnosis Date   Acute bilateral thoracic back pain 08/16/2019   Allergy    Cipro, Plavix, Statins   CAD (coronary artery disease)    a. s/p tandem Promus DES to LAD in 2009 by Dr.  Olevia Perches;  b.  LHC (03/22/14):  prox LAD 30%, mid LAD 40%, LAD stent ok with dist 20% ISR, apical LAD occluded with L-L collats filling apical vessel (too small for PCI), mid RCA 20%, EF 70%.  Med Rx   Diabetes mellitus    Diagnosed 2012   Diverticulosis of colon (without mention of hemorrhage)    GERD (gastroesophageal reflux disease)    Herpes zoster without complication 06/08/6578   Hx of cardiovascular stress test    a. Nuclear (08/2013):  No ischemia, EF 80%, Normal   Hx of echocardiogram    a. Echo (07/2013):  Mild LVH, vigorous LVF, EF 65-70%, Gr 1 DD, mild MR, mild to mod LAE   Hyperlipidemia    intol of statins   Hypertension    NHL (non-Hodgkin's lymphoma) ([place]) dx'd 2003   chemo/xrt comp 2003   Personal history of colonic polyps    Proctitis    Pruritic intertrigo 07/18/2014   Urticaria      Social History   Socioeconomic History   Marital status: Married    Spouse name: Not on file   Number of children: Not on file   Years of education: Not on file   Highest education level: Not on file  Occupational History   Occupation: Retired    Fish farm manager: RETIRED  Tobacco Use   Smoking status:  Never Smoker   Smokeless tobacco: Never Used  Vaping Use   Vaping Use: Never used  Substance and Sexual Activity   Alcohol use: No   Drug use: No   Sexual activity: Not Currently  Other Topics Concern   Not on file  Social History Narrative   Not on file   Social Determinants of Health   Financial Resource Strain: Low Risk    Difficulty of Paying Living Expenses: Not hard at all  Food Insecurity: No Food Insecurity   Worried About Charity fundraiser in the Last Year: Never true   [place] in the Last Year: Never true  Transportation Needs: No Transportation Needs   Lack of Transportation (Medical): No   Lack of Transportation (Non-Medical): No  Physical Activity: Insufficiently Active   Days of Exercise per Week: 7 days   Minutes of  Exercise per Session: 20 min  Stress: No Stress Concern Present   Feeling of Stress : Not at all  Social Connections:    Frequency of Communication with Friends and Family: Not on file   Frequency of Social Gatherings with Friends and Family: Not on file   Attends Religious Services: Not on Electrical engineer or Organizations: Not on file   Attends Archivist Meetings: Not on file   Marital Status: Not on file  Intimate Partner Violence: Not At Risk   Fear of Current or Ex-Partner: No   Emotionally Abused: No   Physically Abused: No   Sexually Abused: No    Past Surgical History:  Procedure Laterality Date   cardiac cath-neg     CORONARY ANGIOPLASTY WITH STENT PLACEMENT     x 2   dexa-neg     KNEE SURGERY  10/2003   left   laser surgery for cataracts-left     LEFT HEART CATHETERIZATION WITH CORONARY ANGIOGRAM N/A 03/22/2014   Procedure: [place];  Surgeon: Burnell Blanks, MD;  Location: [facility] CATH LAB;  Service: Cardiovascular;  Laterality: N/A;   stress cardiolite     VAGINAL HYSTERECTOMY     partial, fibroids, one ovary left    Family History  Problem Relation Age of Onset   Cancer Mother        jaw   Hypertension Father    Heart attack Father    Heart attack Sister    Colon cancer Neg Hx     Allergies  Allergen Reactions   Other Rash and Anaphylaxis   Shellfish Allergy Anaphylaxis and Rash   Cephalexin     REACTION: trash and swelling REACTION: trash and swelling   Ciprofloxacin     REACTION: u/k   Clopidogrel Bisulfate     REACTION: swelling, rash   Codeine Phosphate     REACTION: unspecified   Ezetimibe-Simvastatin     REACTION: myalgias REACTION: myalgias   Hydrocod Polst-Cpm Polst Er     REACTION: u/k   Lidocaine Hives    ONLY TO ADHESIVE LIDOCAINE PATCHES. NO PROBLEM WITH INJECTABLE LIDOCAINE.   Nitrofurantoin     REACTION: [place] REACTION: [place]    Pregabalin     REACTION: felt bad REACTION: felt bad   Propoxyphene N-Acetaminophen     REACTION: [place]   Rofecoxib     unk unk    Sulfamethoxazole     REACTION: unspecified   Sulfonamide Derivatives     REACTION: unspecified   Celecoxib Rash    REACTION: [place] REACTION: [place]  Codeine Rash    REACTION: [place] REACTION: [place]   Hydrocod Polst-Cpm Polst Er Rash   Propoxyphene Rash   Sulfa Antibiotics Rash    REACTION: unspecified REACTION: unspecified    Current Outpatient Medications on File Prior to Visit  Medication Sig Dispense Refill   Accu-Chek Softclix Lancets lancets Use as instructed to test blood sugar daily E11.9 100 each 5   aspirin (ASPIRIN LOW DOSE) 81 MG tablet Take 81 mg by mouth daily.       Blood Glucose Monitoring Suppl (BLOOD GLUCOSE MONITOR SYSTEM) w/Device KIT Use to test blood sugar up to 4 times daily Dx E11.9 1 kit 0   brimonidine-timolol (COMBIGAN) 0.2-0.5 % ophthalmic solution Place 1 drop into both eyes every 12 (twelve) hours.     Cholecalciferol (VITAMIN D-3) 1000 UNITS CAPS Take 1 capsule by mouth daily.     ezetimibe (ZETIA) 10 MG tablet TAKE ONE TABLET BY MOUTH DAILY 90 tablet 3   fexofenadine (ALLEGRA) 180 MG tablet Take 1 tablet (180 mg total) by mouth as needed for allergies or rhinitis. 90 tablet 0   furosemide (LASIX) 20 MG tablet TAKE ONE TABLET BY MOUTH DAILY FOR LEG SWELLING 90 tablet 0   gabapentin (NEURONTIN) 100 MG capsule Take 1 capsule (100 mg total) by mouth 3 (three) times daily. 90 capsule 3   glipiZIDE (GLUCOTROL XL) 10 MG 24 hr tablet TAKE ONE TABLET BY MOUTH EVERY MORNING WITH BREAKFAST 90 tablet 1   glucose blood (ACCU-CHEK GUIDE) test strip Use as instructed to test blood sugar up to 4 times daily Dx E11.9 100 each 5   isosorbide mononitrate (IMDUR) 60 MG 24 hr tablet Take 1 tablet (60 mg total) by mouth daily. 90 tablet 2   metoprolol succinate (TOPROL-XL) 100 MG 24 hr tablet TAKE ONE TABLET BY MOUTH EVERY MORNING  WITH OR IMMEDIATELY FOLLOWING A MEAL 90 tablet 2   nitroGLYCERIN (NITROSTAT) 0.4 MG SL tablet Place 1 tablet (0.4 mg total) under the tongue every 5 (five) minutes as needed for chest pain. 26 tablet 0   OVER THE COUNTER MEDICATION Take 1 tablet by mouth daily. Med name: VISION FORMULA     pantoprazole (PROTONIX) 40 MG tablet TAKE ONE TABLET BY MOUTH DAILY FOR HEARTBURN 90 tablet 1   timolol (TIMOPTIC) 0.5 % ophthalmic solution      trimethoprim (TRIMPEX) 100 MG tablet Take 100 mg by mouth 2 (two) times daily as needed (UTI).      vitamin B-12 (CYANOCOBALAMIN) 1000 MCG tablet Take 1,000 mcg by mouth daily.     [DISCONTINUED] loratadine (CLARITIN) 10 MG tablet Take 10 mg by mouth as needed.      No current facility-administered medications on file prior to visit.    BP 108/62    Pulse 62    Temp 98.6 F (37 C) (Temporal)    Ht 5' 8"  (1.727 m)    Wt 167 lb (75.8 kg)    SpO2 98%    BMI 25.39 kg/m    Objective:   Physical Exam Pulmonary:     Effort: Pulmonary effort is normal.  Skin:    Comments: Very small, raised, flesh colored, circular mass to right nasal bridge. Non tender. No surrounding erythema. Appears to contain non infectious serum.  Neurological:     Mental Status: She is alert.  Psychiatric:        Mood and Affect: Mood normal.            Assessment & Plan:

## 2020-09-11 NOTE — Assessment & Plan Note (Signed)
Appears benign, almost like small sebaceous cyst.  She has been taught by her prior dermatologist how to relieve these, she is prepared to do so at home. We will see her back in a week for so for follow up.

## 2020-09-11 NOTE — Patient Instructions (Signed)
Stop by the lab prior to leaving today. I will notify you of your results once received.   We changed the dose of your sertraline (Zoloft) to 50 mg. Take 1 tablet once daily for anxiety.  It was a pleasure to see you today!

## 2020-09-12 ENCOUNTER — Encounter: Payer: Self-pay | Admitting: Podiatry

## 2020-09-12 ENCOUNTER — Ambulatory Visit: Payer: Medicare PPO | Admitting: Podiatry

## 2020-09-12 VITALS — Temp 96.7°F

## 2020-09-12 DIAGNOSIS — M7741 Metatarsalgia, right foot: Secondary | ICD-10-CM

## 2020-09-12 DIAGNOSIS — M7742 Metatarsalgia, left foot: Secondary | ICD-10-CM

## 2020-09-12 DIAGNOSIS — M7751 Other enthesopathy of right foot: Secondary | ICD-10-CM

## 2020-09-12 DIAGNOSIS — G629 Polyneuropathy, unspecified: Secondary | ICD-10-CM

## 2020-09-12 NOTE — Progress Notes (Signed)
   HPI: 86 y.o. female presenting today for follow-up evaluation of bilateral foot pain right greater than the left.  Is been ongoing for several years.  She states that the gabapentin does help with the burning sensation however she continues to have soreness and achiness to the bilateral feet.  She presents for further treatment evaluation   Past Medical History:  Diagnosis Date  . Acute bilateral thoracic back pain 08/16/2019  . Allergy    Cipro, Plavix, Statins  . CAD (coronary artery disease)    a. s/p tandem Promus DES to LAD in 2009 by Dr. Olevia Perches;  b.  LHC (03/22/14):  prox LAD 30%, mid LAD 40%, LAD stent ok with dist 20% ISR, apical LAD occluded with L-L collats filling apical vessel (too small for PCI), mid RCA 20%, EF 70%.  Med Rx  . Diabetes mellitus    Diagnosed 2012  . Diverticulosis of colon (without mention of hemorrhage)   . GERD (gastroesophageal reflux disease)   . Herpes zoster without complication 03/07/8118  . Hx of cardiovascular stress test    a. Nuclear (08/2013):  No ischemia, EF 80%, Normal  . Hx of echocardiogram    a. Echo (07/2013):  Mild LVH, vigorous LVF, EF 65-70%, Gr 1 DD, mild MR, mild to mod LAE  . Hyperlipidemia    intol of statins  . Hypertension   . NHL (non-Hodgkin's lymphoma) ([place]) dx'd 2003   chemo/xrt comp 2003  . Personal history of colonic polyps   . Proctitis   . Pruritic intertrigo 07/18/2014  . Urticaria      Physical Exam: General: The patient is alert and oriented x3 in no acute distress.  Dermatology: Skin is warm, dry and supple bilateral lower extremities. Negative for open lesions or macerations.  Vascular: Palpable pedal pulses bilaterally. No edema or erythema noted. Capillary refill within normal limits.  Neurological: Epicritic and protective threshold diminished bilaterally.   Musculoskeletal Exam: Pain with palpation noted to the metatarsal heads of the bilateral feet.  Today there is also pain on palpation range of motion  the first MTPJ of the right foot.  Range of motion within normal limits to all pedal and ankle joints bilateral. Muscle strength 5/5 in all groups bilateral.     Assessment: 1. Metatarsalgia bilateral  2. Peripheral polyneuropathy bilaterally secondary to diabetes mellitus  3.  Metatarsophalangeal capsulitis first MTPJ right foot   Plan of Care:  1. Patient evaluated.  2.  Injection of 0.5 cc Celestone Soluspan injection the first MTPJ right foot.  3.  Continue gabapentin 100 mg TID provided to patient.  4. Recommended good shoes and insoles at [employer] running store 5. Return to clinic as needed.        Edrick Kins, DPM [facility]  Dr. Edrick Kins, DPM    [address], [place] [address]                Office [phone]  Fax [phone]

## 2020-09-23 ENCOUNTER — Other Ambulatory Visit: Payer: Medicare PPO

## 2020-10-02 ENCOUNTER — Other Ambulatory Visit: Payer: Self-pay | Admitting: Cardiovascular Disease

## 2020-10-21 ENCOUNTER — Other Ambulatory Visit: Payer: Self-pay

## 2020-10-21 MED ORDER — EZETIMIBE 10 MG PO TABS
10.0000 mg | ORAL_TABLET | Freq: Every day | ORAL | 1 refills | Status: DC
Start: 2020-10-21 — End: 2021-05-11

## 2020-10-21 MED ORDER — METOPROLOL SUCCINATE ER 100 MG PO TB24: ORAL_TABLET | ORAL | 1 refills | Status: DC

## 2020-10-21 MED ORDER — ISOSORBIDE MONONITRATE ER 60 MG PO TB24
60.0000 mg | ORAL_TABLET | Freq: Every day | ORAL | 1 refills | Status: DC
Start: 2020-10-21 — End: 2021-05-11

## 2020-10-24 ENCOUNTER — Other Ambulatory Visit: Payer: Self-pay

## 2020-10-24 DIAGNOSIS — R600 Localized edema: Secondary | ICD-10-CM

## 2020-10-24 DIAGNOSIS — E0821 Diabetes mellitus due to underlying condition with diabetic nephropathy: Secondary | ICD-10-CM

## 2020-10-24 DIAGNOSIS — K219 Gastro-esophageal reflux disease without esophagitis: Secondary | ICD-10-CM

## 2020-10-24 MED ORDER — GLIPIZIDE ER 10 MG PO TB24: ORAL_TABLET | ORAL | 1 refills | Status: DC

## 2020-10-24 MED ORDER — FUROSEMIDE 20 MG PO TABS: ORAL_TABLET | ORAL | 0 refills | Status: DC

## 2020-10-24 MED ORDER — PANTOPRAZOLE SODIUM 40 MG PO TBEC: DELAYED_RELEASE_TABLET | ORAL | 1 refills | Status: DC

## 2020-10-27 ENCOUNTER — Other Ambulatory Visit: Payer: Self-pay

## 2020-10-27 DIAGNOSIS — R600 Localized edema: Secondary | ICD-10-CM

## 2020-10-27 DIAGNOSIS — Z6379 Other stressful life events affecting family and household: Secondary | ICD-10-CM

## 2020-10-27 DIAGNOSIS — E0821 Diabetes mellitus due to underlying condition with diabetic nephropathy: Secondary | ICD-10-CM

## 2020-10-27 DIAGNOSIS — K219 Gastro-esophageal reflux disease without esophagitis: Secondary | ICD-10-CM

## 2020-10-27 MED ORDER — ACCU-CHEK SOFTCLIX LANCETS MISC
5 refills | Status: DC
Start: 2020-10-27 — End: 2020-10-28

## 2020-10-27 MED ORDER — SERTRALINE HCL 50 MG PO TABS: 50.0000 mg | ORAL_TABLET | Freq: Every day | ORAL | 3 refills | Status: DC

## 2020-10-27 MED ORDER — PANTOPRAZOLE SODIUM 40 MG PO TBEC: DELAYED_RELEASE_TABLET | ORAL | 1 refills | Status: DC

## 2020-10-27 MED ORDER — TRUE METRIX METER DEVI: 1.0000 | 0 refills | Status: DC

## 2020-10-27 MED ORDER — RELION TRUE METRIX TEST STRIPS VI STRP: ORAL_STRIP | 12 refills | Status: DC

## 2020-10-27 MED ORDER — FUROSEMIDE 20 MG PO TABS: ORAL_TABLET | ORAL | 0 refills | Status: DC

## 2020-10-27 MED ORDER — GLIPIZIDE ER 10 MG PO TB24: ORAL_TABLET | ORAL | 1 refills | Status: DC

## 2020-10-28 ENCOUNTER — Other Ambulatory Visit: Payer: Self-pay

## 2020-10-28 ENCOUNTER — Other Ambulatory Visit: Payer: Self-pay | Admitting: Primary Care

## 2020-10-28 ENCOUNTER — Telehealth: Payer: Self-pay | Admitting: *Deleted

## 2020-10-28 DIAGNOSIS — E1100 Type 2 diabetes mellitus with hyperosmolarity without nonketotic hyperglycemic-hyperosmolar coma (NKHHC): Secondary | ICD-10-CM

## 2020-10-28 DIAGNOSIS — Z6379 Other stressful life events affecting family and household: Secondary | ICD-10-CM

## 2020-10-28 MED ORDER — RELION LANCETS MICRO-THIN 33G MISC: 5 refills | Status: DC

## 2020-10-28 MED ORDER — RELION TRUE METRIX TEST STRIPS VI STRP
ORAL_STRIP | 12 refills | Status: DC
Start: 2020-10-28 — End: 2020-10-28

## 2020-10-28 MED ORDER — SERTRALINE HCL 50 MG PO TABS: 50.0000 mg | ORAL_TABLET | Freq: Every day | ORAL | 3 refills | Status: DC

## 2020-10-28 MED ORDER — RELION TRUE METRIX TEST STRIPS VI STRP: ORAL_STRIP | 5 refills | Status: DC

## 2020-10-28 MED ORDER — TRUE METRIX METER DEVI: 0 refills | Status: DC

## 2020-10-28 MED ORDER — TRUE METRIX METER DEVI
1.0000 | 0 refills | Status: DC
Start: 2020-10-28 — End: 2020-10-28

## 2020-10-28 NOTE — Telephone Encounter (Signed)
Called [name] and cancelled prescriptions for sertraline and True Metrix Diabetic testing supplies.  Prescriptions should have gone to [facility].  Anda Kraft resent correct prescriptions to [place].

## 2020-10-28 NOTE — Telephone Encounter (Signed)
Received a call from [facility] stating they faxed over refill request for patient's Sertraline,True metrix meter, test strips and lancets.  They states they have not received a response.   Looking at chart, looks like refills were sent to [place].  Will forward message to [facility] to verify that refills were sent to correct pharmacy.

## 2020-10-28 NOTE — Telephone Encounter (Signed)
Refill sent to mail order. 

## 2020-10-28 NOTE — Telephone Encounter (Signed)
Pharmacist from [name] called stating that they received a script for a glucose meter and they need specific directions. Pharmacist stated that medicare will not accept as directed. Please call back with directions.

## 2020-10-31 ENCOUNTER — Other Ambulatory Visit: Payer: Self-pay

## 2020-10-31 MED ORDER — GABAPENTIN 100 MG PO CAPS
100.0000 mg | ORAL_CAPSULE | Freq: Three times a day (TID) | ORAL | 1 refills | Status: DC
Start: 2020-10-31 — End: 2021-09-07

## 2020-10-31 NOTE — Telephone Encounter (Signed)
Refill request from [place] for Gabapentin.  Per Dr. Amalia Hailey verbal order, ok to refill.   New script has been sent to pharmacy

## 2020-11-14 ENCOUNTER — Other Ambulatory Visit: Payer: Self-pay | Admitting: *Deleted

## 2020-11-14 MED ORDER — TRUE METRIX LEVEL 1 LOW VI SOLN: 0 refills | Status: DC

## 2020-11-20 DIAGNOSIS — L570 Actinic keratosis: Secondary | ICD-10-CM | POA: Diagnosis not present

## 2020-11-20 DIAGNOSIS — D225 Melanocytic nevi of trunk: Secondary | ICD-10-CM | POA: Diagnosis not present

## 2020-11-20 DIAGNOSIS — D2272 Melanocytic nevi of left lower limb, including hip: Secondary | ICD-10-CM | POA: Diagnosis not present

## 2020-11-20 DIAGNOSIS — D2262 Melanocytic nevi of left upper limb, including shoulder: Secondary | ICD-10-CM | POA: Diagnosis not present

## 2020-11-20 DIAGNOSIS — R208 Other disturbances of skin sensation: Secondary | ICD-10-CM | POA: Diagnosis not present

## 2020-11-20 DIAGNOSIS — L821 Other seborrheic keratosis: Secondary | ICD-10-CM | POA: Diagnosis not present

## 2020-11-20 DIAGNOSIS — B078 Other viral warts: Secondary | ICD-10-CM | POA: Diagnosis not present

## 2020-11-20 DIAGNOSIS — D2261 Melanocytic nevi of right upper limb, including shoulder: Secondary | ICD-10-CM | POA: Diagnosis not present

## 2020-11-20 DIAGNOSIS — D2271 Melanocytic nevi of right lower limb, including hip: Secondary | ICD-10-CM | POA: Diagnosis not present

## 2021-01-19 DIAGNOSIS — Z9842 Cataract extraction status, left eye: Secondary | ICD-10-CM | POA: Diagnosis not present

## 2021-01-19 DIAGNOSIS — H5213 Myopia, bilateral: Secondary | ICD-10-CM | POA: Diagnosis not present

## 2021-01-19 DIAGNOSIS — Z9841 Cataract extraction status, right eye: Secondary | ICD-10-CM | POA: Diagnosis not present

## 2021-01-19 DIAGNOSIS — H401133 Primary open-angle glaucoma, bilateral, severe stage: Secondary | ICD-10-CM | POA: Diagnosis not present

## 2021-01-19 DIAGNOSIS — E119 Type 2 diabetes mellitus without complications: Secondary | ICD-10-CM | POA: Diagnosis not present

## 2021-01-19 DIAGNOSIS — H59811 Chorioretinal scars after surgery for detachment, right eye: Secondary | ICD-10-CM | POA: Diagnosis not present

## 2021-01-20 LAB — HM DIABETES EYE EXAM

## 2021-01-21 ENCOUNTER — Other Ambulatory Visit: Payer: Self-pay

## 2021-01-21 ENCOUNTER — Ambulatory Visit: Payer: Medicare PPO | Admitting: Primary Care

## 2021-01-21 ENCOUNTER — Encounter: Payer: Self-pay | Admitting: Primary Care

## 2021-01-21 VITALS — BP 120/62 | HR 68 | Temp 97.6°F

## 2021-01-21 DIAGNOSIS — R1032 Left lower quadrant pain: Secondary | ICD-10-CM | POA: Diagnosis not present

## 2021-01-21 LAB — CBC WITH DIFFERENTIAL/PLATELET
Basophils Absolute: 0 10*3/uL (ref 0.0–0.1)
Basophils Relative: 0.5 % (ref 0.0–3.0)
Eosinophils Absolute: 0.1 10*3/uL (ref 0.0–0.7)
Eosinophils Relative: 1.3 % (ref 0.0–5.0)
HCT: 39.4 % (ref 36.0–46.0)
Hemoglobin: 13.2 g/dL (ref 12.0–15.0)
Lymphocytes Relative: 40.7 % (ref 12.0–46.0)
Lymphs Abs: 1.8 10*3/uL (ref 0.7–4.0)
MCHC: 33.6 g/dL (ref 30.0–36.0)
MCV: 94.3 fl (ref 78.0–100.0)
Monocytes Absolute: 0.4 10*3/uL (ref 0.1–1.0)
Monocytes Relative: 10.2 % (ref 3.0–12.0)
Neutro Abs: 2.1 10*3/uL (ref 1.4–7.7)
Neutrophils Relative %: 47.3 % (ref 43.0–77.0)
Platelets: 209 10*3/uL (ref 150.0–400.0)
RBC: 4.18 Mil/uL (ref 3.87–5.11)
RDW: 13.3 % (ref 11.5–15.5)
WBC: 4.4 10*3/uL (ref 4.0–10.5)

## 2021-01-21 LAB — COMPREHENSIVE METABOLIC PANEL
ALT: 33 U/L (ref 0–35)
AST: 37 U/L (ref 0–37)
Albumin: 4.1 g/dL (ref 3.5–5.2)
Alkaline Phosphatase: 47 U/L (ref 39–117)
BUN: 18 mg/dL (ref 6–23)
CO2: 31 mEq/L (ref 19–32)
Calcium: 9.3 mg/dL (ref 8.4–10.5)
Chloride: 101 mEq/L (ref 96–112)
Creatinine, Ser: 1.29 mg/dL — ABNORMAL HIGH (ref 0.40–1.20)
GFR: 37.46 mL/min — ABNORMAL LOW (ref >60.00)
Glucose, Bld: 166 mg/dL — ABNORMAL HIGH (ref 70–99)
Potassium: 4.4 mEq/L (ref 3.5–5.1)
Sodium: 137 mEq/L (ref 135–145)
Total Bilirubin: 0.7 mg/dL (ref 0.2–1.2)
Total Protein: 6.9 g/dL (ref 6.0–8.3)

## 2021-01-21 MED ORDER — AMOXICILLIN-POT CLAVULANATE 875-125 MG PO TABS: 1.0000 | ORAL_TABLET | Freq: Two times a day (BID) | ORAL | 0 refills | Status: DC

## 2021-01-21 NOTE — Patient Instructions (Signed)
Stop by the lab prior to leaving today. I will notify you of your results once received.   Start Augmentin antibiotics for the infection Take 1 tablet by mouth twice daily for 10 days.  Try a very bland/liquid diet with broths and clear soups for now.  Please update me in one week.  It was a pleasure to see you today!

## 2021-01-21 NOTE — Progress Notes (Signed)
Subjective:    Patient ID: [name] [name], female    DOB: [birth year], 87 y.o.   MRN: [id number]  HPI  This visit occurred during the SARS-CoV-2 public health emergency.  Safety protocols were in place, including screening questions prior to the visit, additional usage of staff PPE, and extensive cleaning of exam room while observing appropriate contact time as indicated for disinfecting solutions.   Ms. Ambrocio is a 87 year old female with a significant medical history including hypertension, type 2 diabetes, renal insufficiency, hyperlipidemia, chronic fatigue, anxiety, B12 deficiency, recurrent UTI who presents today with a chief complaint of abdominal pain.  Her pain is located to the LLQ abdomen that began about one week ago. The pain is intermittent, notices a sensation of emptiness to that side when she has a bowel movement. Also with significant amount of gas for years, off and on.   She denies constipation, bloody stools, fevers. She has noticed more loose stools with cramping during pain episodes. Her last colonoscopy from 2010 showed "scattered diverticulosis" to the descending and sigmoid colon. Overall symptoms are not worse, but also not better. She does remember having soup with corn last week.   She has been tugging on her husband in order to re-position him in bed, she's unsure if her symptoms are secondary to MSK etiology.   Review of Systems  Constitutional: Negative for fever.  Respiratory: Negative for shortness of breath.   Cardiovascular: Negative for chest pain.  Gastrointestinal: Positive for abdominal pain, diarrhea and nausea. Negative for blood in stool and vomiting.       Past Medical History:  Diagnosis Date  . Acute bilateral thoracic back pain 08/16/2019  . Allergy    Cipro, Plavix, Statins  . CAD (coronary artery disease)    a. s/p tandem Promus DES to LAD in 2009 by Dr. Olevia Perches;  b.  LHC (03/22/14):  prox LAD 30%, mid LAD 40%, LAD stent ok with dist 20% ISR,  apical LAD occluded with L-L collats filling apical vessel (too small for PCI), mid RCA 20%, EF 70%.  Med Rx  . Diabetes mellitus    Diagnosed 2012  . Diverticulosis of colon (without mention of hemorrhage)   . GERD (gastroesophageal reflux disease)   . Herpes zoster without complication 0/24/0973  . Hx of cardiovascular stress test    a. Nuclear (08/2013):  No ischemia, EF 80%, Normal  . Hx of echocardiogram    a. Echo (07/2013):  Mild LVH, vigorous LVF, EF 65-70%, Gr 1 DD, mild MR, mild to mod LAE  . Hyperlipidemia    intol of statins  . Hypertension   . NHL (non-Hodgkin's lymphoma) ([place]) dx'd 2003   chemo/xrt comp 2003  . Personal history of colonic polyps   . Proctitis   . Pruritic intertrigo 07/18/2014  . Urticaria      Social History   Socioeconomic History  . Marital status: Married    Spouse name: Not on file  . Number of children: Not on file  . Years of education: Not on file  . Highest education level: Not on file  Occupational History  . Occupation: Retired    Fish farm manager: RETIRED  Tobacco Use  . Smoking status: Never Smoker  . Smokeless tobacco: Never Used  Vaping Use  . Vaping Use: Never used  Substance and Sexual Activity  . Alcohol use: No  . Drug use: No  . Sexual activity: Not Currently  Other Topics Concern  . Not on file  Social History Narrative  . Not on file   Social Determinants of Health   Financial Resource Strain: Low Risk   . Difficulty of Paying Living Expenses: Not hard at all  Food Insecurity: No Food Insecurity  . Worried About Charity fundraiser in the Last Year: Never true  . Ran Out of Food in the Last Year: Never true  Transportation Needs: No Transportation Needs  . Lack of Transportation (Medical): No  . Lack of Transportation (Non-Medical): No  Physical Activity: Insufficiently Active  . Days of Exercise per Week: 7 days  . Minutes of Exercise per Session: 20 min  Stress: No Stress Concern Present  . Feeling of Stress :  Not at all  Social Connections: Not on file  Intimate Partner Violence: Not At Risk  . Fear of Current or Ex-Partner: No  . Emotionally Abused: No  . Physically Abused: No  . Sexually Abused: No    Past Surgical History:  Procedure Laterality Date  . cardiac cath-neg    . CORONARY ANGIOPLASTY WITH STENT PLACEMENT     x 2  . dexa-neg    . KNEE SURGERY  10/2003   left  . laser surgery for cataracts-left    . LEFT HEART CATHETERIZATION WITH CORONARY ANGIOGRAM N/A 03/22/2014   Procedure: LEFT HEART CATHETERIZATION WITH CORONARY ANGIOGRAM;  Surgeon: Burnell Blanks, MD;  Location: [facility] CATH LAB;  Service: Cardiovascular;  Laterality: N/A;  . stress cardiolite    . VAGINAL HYSTERECTOMY     partial, fibroids, one ovary left    Family History  Problem Relation Age of Onset  . Cancer Mother        jaw  . Hypertension Father   . Heart attack Father   . Heart attack Sister   . Colon cancer Neg Hx     Allergies  Allergen Reactions  . Other Rash and Anaphylaxis  . Shellfish Allergy Anaphylaxis and Rash  . Cephalexin     REACTION: trash and swelling REACTION: trash and swelling  . Ciprofloxacin     REACTION: u/k  . Clopidogrel Bisulfate     REACTION: swelling, rash  . Codeine Phosphate     REACTION: unspecified  . Ezetimibe-Simvastatin     REACTION: myalgias REACTION: myalgias  . Hydrocod Polst-Cpm Polst Er     REACTION: u/k  . Lidocaine Hives    ONLY TO ADHESIVE LIDOCAINE PATCHES. NO PROBLEM WITH INJECTABLE LIDOCAINE.  Marland Kitchen Nitrofurantoin     REACTION: [place] REACTION: [place]  . Pregabalin     REACTION: felt bad REACTION: felt bad  . Propoxyphene N-Acetaminophen     REACTION: [place]  . Rofecoxib     unk unk   . Sulfamethoxazole     REACTION: unspecified  . Sulfonamide Derivatives     REACTION: unspecified  . Celecoxib Rash    REACTION: [place] REACTION: [place]  . Codeine Rash    REACTION: [place] REACTION: [place]  . Hydrocod Polst-Cpm Polst Er Rash  . Propoxyphene Rash  . Sulfa  Antibiotics Rash    REACTION: unspecified REACTION: unspecified    Current Outpatient Medications on File Prior to Visit  Medication Sig Dispense Refill  . aspirin 81 MG tablet Take 81 mg by mouth daily.    . Blood Glucose Calibration (TRUE METRIX LEVEL 1) Low SOLN Use to calibrate meter 3 each 0  . Blood Glucose Monitoring Suppl (TRUE METRIX METER) DEVI Test blood sugars once or twice daily for diabetes. 1 each 0  . brimonidine-timolol (  COMBIGAN) 0.2-0.5 % ophthalmic solution Place 1 drop into both eyes every 12 (twelve) hours.    . Cholecalciferol (VITAMIN D-3) 1000 UNITS CAPS Take 1 capsule by mouth daily.    Marland Kitchen ezetimibe (ZETIA) 10 MG tablet Take 1 tablet (10 mg total) by mouth daily. 90 tablet 1  . fexofenadine (ALLEGRA) 180 MG tablet Take 1 tablet (180 mg total) by mouth as needed for allergies or rhinitis. 90 tablet 0  . furosemide (LASIX) 20 MG tablet TAKE ONE TABLET BY MOUTH DAILY FOR LEG SWELLING 90 tablet 0  . gabapentin (NEURONTIN) 100 MG capsule Take 1 capsule (100 mg total) by mouth 3 (three) times daily. 270 capsule 1  . glipiZIDE (GLUCOTROL XL) 10 MG 24 hr tablet TAKE ONE TABLET BY MOUTH EVERY MORNING WITH BREAKFAST 90 tablet 1  . glucose blood (RELION TRUE METRIX TEST STRIPS) test strip Test blood sugars once to twice daily for diabetes. 100 each 5  . isosorbide mononitrate (IMDUR) 60 MG 24 hr tablet Take 1 tablet (60 mg total) by mouth daily. 90 tablet 1  . metoprolol succinate (TOPROL-XL) 100 MG 24 hr tablet TAKE ONE TABLET BY MOUTH EVERY MORNING WITH OR IMMEDIATELY FOLLOWING A MEAL 90 tablet 1  . nitroGLYCERIN (NITROSTAT) 0.4 MG SL tablet Place 1 tablet (0.4 mg total) under the tongue every 5 (five) minutes as needed for chest pain. 26 tablet 0  . OVER THE COUNTER MEDICATION Take 1 tablet by mouth daily. Med name: VISION FORMULA    . pantoprazole (PROTONIX) 40 MG tablet TAKE ONE TABLET BY MOUTH DAILY FOR HEARTBURN 90 tablet 1  . ReliOn Lancets Micro-Thin 33G MISC Test blood  sugars once to twice daily for diabetes. 100 each 5  . sertraline (ZOLOFT) 50 MG tablet Take 1 tablet (50 mg total) by mouth daily. For anxiety. 90 tablet 3  . timolol (TIMOPTIC) 0.5 % ophthalmic solution     . trimethoprim (TRIMPEX) 100 MG tablet Take 100 mg by mouth 2 (two) times daily as needed (UTI).     . vitamin B-12 (CYANOCOBALAMIN) 1000 MCG tablet Take 1,000 mcg by mouth daily.    . [DISCONTINUED] loratadine (CLARITIN) 10 MG tablet Take 10 mg by mouth as needed.      No current facility-administered medications on file prior to visit.    BP 120/62   Pulse 68   Temp 97.6 F (36.4 C) (Temporal)    Objective:   Physical Exam Constitutional:      General: She is not in acute distress.    Appearance: She is well-nourished. She is not ill-appearing.  Cardiovascular:     Rate and Rhythm: Normal rate and regular rhythm.  Pulmonary:     Effort: Pulmonary effort is normal.     Breath sounds: Normal breath sounds.  Abdominal:     General: Bowel sounds are increased.     Palpations: Abdomen is soft.     Tenderness: There is abdominal tenderness in the left lower quadrant. There is no guarding.  Musculoskeletal:     Cervical back: Neck supple.  Skin:    General: Skin is warm and dry.  Neurological:     Mental Status: She is alert.  Psychiatric:        Mood and Affect: Mood and affect normal.            Assessment & Plan:

## 2021-01-21 NOTE — Assessment & Plan Note (Signed)
Acute for the last week, also with diarrhea and cramping.  Exam today with moderate tenderness on exam. No urinary symptoms. Given history of diverticulosis to sigmoid and descending colon coupled with HPI and presentation, will treat for presumed diverticulitis.   Rx for Augmentin course sent to pharmacy. She has taken Amoxil without problems. Discussed potential allergy interaction.   She will update in one week. Return precautions provided.

## 2021-01-23 ENCOUNTER — Encounter: Payer: Self-pay | Admitting: Primary Care

## 2021-02-26 DIAGNOSIS — H04123 Dry eye syndrome of bilateral lacrimal glands: Secondary | ICD-10-CM | POA: Diagnosis not present

## 2021-02-26 DIAGNOSIS — H0288A Meibomian gland dysfunction right eye, upper and lower eyelids: Secondary | ICD-10-CM | POA: Diagnosis not present

## 2021-02-26 DIAGNOSIS — H0288B Meibomian gland dysfunction left eye, upper and lower eyelids: Secondary | ICD-10-CM | POA: Diagnosis not present

## 2021-04-11 ENCOUNTER — Other Ambulatory Visit: Payer: Self-pay | Admitting: Primary Care

## 2021-04-11 DIAGNOSIS — K219 Gastro-esophageal reflux disease without esophagitis: Secondary | ICD-10-CM

## 2021-04-11 DIAGNOSIS — E0821 Diabetes mellitus due to underlying condition with diabetic nephropathy: Secondary | ICD-10-CM

## 2021-04-14 ENCOUNTER — Telehealth: Payer: Self-pay | Admitting: *Deleted

## 2021-04-14 NOTE — Telephone Encounter (Signed)
"  I was calling to get an appointment with Dr. Amalia Hailey about my feet.  Give me a call please."

## 2021-04-17 ENCOUNTER — Other Ambulatory Visit: Payer: Self-pay

## 2021-04-17 ENCOUNTER — Ambulatory Visit: Payer: Medicare PPO | Admitting: Podiatry

## 2021-04-17 DIAGNOSIS — M7741 Metatarsalgia, right foot: Secondary | ICD-10-CM | POA: Diagnosis not present

## 2021-04-17 DIAGNOSIS — M7751 Other enthesopathy of right foot: Secondary | ICD-10-CM | POA: Diagnosis not present

## 2021-04-17 DIAGNOSIS — M2042 Other hammer toe(s) (acquired), left foot: Secondary | ICD-10-CM | POA: Diagnosis not present

## 2021-04-17 DIAGNOSIS — M7742 Metatarsalgia, left foot: Secondary | ICD-10-CM | POA: Diagnosis not present

## 2021-04-17 DIAGNOSIS — E0843 Diabetes mellitus due to underlying condition with diabetic autonomic (poly)neuropathy: Secondary | ICD-10-CM | POA: Diagnosis not present

## 2021-04-17 DIAGNOSIS — M2041 Other hammer toe(s) (acquired), right foot: Secondary | ICD-10-CM

## 2021-04-17 MED ORDER — BETAMETHASONE SOD PHOS & ACET 6 (3-3) MG/ML IJ SUSP
3.0000 mg | Freq: Once | INTRAMUSCULAR | Status: AC
  Administered 2021-04-17: 3 mg via INTRA_ARTICULAR

## 2021-04-17 NOTE — Progress Notes (Signed)
   HPI: 87 y.o. female presenting today for follow-up evaluation of bilateral foot pain right greater than the left.  Is been ongoing for several years.  She states that the gabapentin does help with the burning sensation however she continues to have soreness and achiness to the bilateral feet.  She presents for further treatment evaluation   Past Medical History:  Diagnosis Date  . Acute bilateral thoracic back pain 08/16/2019  . Allergy    Cipro, Plavix, Statins  . CAD (coronary artery disease)    a. s/p tandem Promus DES to LAD in 2009 by Dr. Olevia Perches;  b.  LHC (03/22/14):  prox LAD 30%, mid LAD 40%, LAD stent ok with dist 20% ISR, apical LAD occluded with L-L collats filling apical vessel (too small for PCI), mid RCA 20%, EF 70%.  Med Rx  . Diabetes mellitus    Diagnosed 2012  . Diverticulosis of colon (without mention of hemorrhage)   . GERD (gastroesophageal reflux disease)   . Herpes zoster without complication 2/67/1245  . Hx of cardiovascular stress test    a. Nuclear (08/2013):  No ischemia, EF 80%, Normal  . Hx of echocardiogram    a. Echo (07/2013):  Mild LVH, vigorous LVF, EF 65-70%, Gr 1 DD, mild MR, mild to mod LAE  . Hyperlipidemia    intol of statins  . Hypertension   . NHL (non-Hodgkin's lymphoma) ([place]) dx'd 2003   chemo/xrt comp 2003  . Personal history of colonic polyps   . Proctitis   . Pruritic intertrigo 07/18/2014  . Urticaria      Physical Exam: General: The patient is alert and oriented x3 in no acute distress.  Dermatology: Skin is warm, dry and supple bilateral lower extremities. Negative for open lesions or macerations.  Vascular: Palpable pedal pulses bilaterally. No edema or erythema noted. Capillary refill within normal limits.  Neurological: Epicritic and protective threshold diminished bilaterally.   Musculoskeletal Exam: Pain with palpation noted to the metatarsal heads of the bilateral feet.  Today there is also pain on palpation range of motion  the first MTPJ of the right foot.  Range of motion within normal limits to all pedal and ankle joints bilateral. Muscle strength 5/5 in all groups bilateral.     Assessment: 1. Metatarsalgia bilateral  2. Peripheral polyneuropathy bilaterally secondary to diabetes mellitus  3.  Metatarsophalangeal capsulitis first MTPJ right foot   Plan of Care:  1. Patient evaluated.  2.  Injection of 0.5 cc Celestone Soluspan injection the first MTPJ right foot.  3.  Continue gabapentin 100 mg TID provided to patient.  4.  Appointment with Pedorthist for custom molded insoles and diabetic shoes 5. Return to clinic as needed.        Edrick Kins, DPM [facility]  Dr. Edrick Kins, DPM    [address], [place] [address]                Office [phone]  Fax [phone]

## 2021-04-17 NOTE — Telephone Encounter (Signed)
Ms. Ferrara was seen today, 04/17/2021, by Dr. Amalia Hailey.

## 2021-04-28 DIAGNOSIS — Z1231 Encounter for screening mammogram for malignant neoplasm of breast: Secondary | ICD-10-CM | POA: Diagnosis not present

## 2021-04-28 LAB — HM MAMMOGRAPHY

## 2021-04-29 ENCOUNTER — Other Ambulatory Visit: Payer: Self-pay

## 2021-04-29 ENCOUNTER — Telehealth: Payer: Self-pay | Admitting: Podiatry

## 2021-04-29 ENCOUNTER — Ambulatory Visit: Payer: Medicare PPO

## 2021-04-29 DIAGNOSIS — M2041 Other hammer toe(s) (acquired), right foot: Secondary | ICD-10-CM

## 2021-04-29 DIAGNOSIS — E0843 Diabetes mellitus due to underlying condition with diabetic autonomic (poly)neuropathy: Secondary | ICD-10-CM

## 2021-04-29 DIAGNOSIS — M2042 Other hammer toe(s) (acquired), left foot: Secondary | ICD-10-CM

## 2021-04-29 NOTE — Telephone Encounter (Signed)
Pt left message stating she was seen this morning and called the office of the np [name] and they told her to just send the paperwork to attention of kate clark.  I returned her call and explained that she has to be seen by the md/do at that facility or they have to co-sign the office note with the np. And I need to know the name of the md/do so I can put the paperwork in there name. She is waiting on a call back to let her know what md/do she needs to see and is to let me know so I can fax the paperwork to them.

## 2021-04-29 NOTE — Progress Notes (Signed)
Patient presents to the office today for diabetic shoe and insole measuring.  Patient was measured with brannock device to determine size and width for 1 pair of extra depth shoes and foam casted for 3 pair of insoles.   Documentation of medical necessity will be sent to patient's treating diabetic doctor to verify and sign.   Patient's diabetic provider: Dr. Tressia Miners and insoles will be ordered at that time and patient will be notified for an appointment for fitting when they arrive.   Shoe size (per patient): 10 medium   Brannock measurement: 10 medium Patient shoe selection-   1st choice:   [id number] [employer]  2nd choice:    Shoe size ordered: 10 medium

## 2021-04-30 ENCOUNTER — Telehealth: Payer: Self-pay | Admitting: Primary Care

## 2021-04-30 ENCOUNTER — Other Ambulatory Visit: Payer: Self-pay

## 2021-04-30 ENCOUNTER — Encounter: Payer: Self-pay | Admitting: Emergency Medicine

## 2021-04-30 ENCOUNTER — Ambulatory Visit
Admission: EM | Admit: 2021-04-30 | Discharge: 2021-04-30 | Payer: Medicare PPO | Attending: Emergency Medicine | Admitting: Emergency Medicine

## 2021-04-30 ENCOUNTER — Emergency Department
Admission: EM | Admit: 2021-04-30 | Discharge: 2021-04-30 | Disposition: A | Discharge status: Home / Self Care | Payer: Medicare PPO | Attending: Emergency Medicine | Admitting: Emergency Medicine

## 2021-04-30 DIAGNOSIS — N390 Urinary tract infection, site not specified: Secondary | ICD-10-CM

## 2021-04-30 DIAGNOSIS — E1165 Type 2 diabetes mellitus with hyperglycemia: Secondary | ICD-10-CM | POA: Diagnosis not present

## 2021-04-30 DIAGNOSIS — I2510 Atherosclerotic heart disease of native coronary artery without angina pectoris: Secondary | ICD-10-CM | POA: Insufficient documentation

## 2021-04-30 DIAGNOSIS — Z79899 Other long term (current) drug therapy: Secondary | ICD-10-CM | POA: Diagnosis not present

## 2021-04-30 DIAGNOSIS — N3000 Acute cystitis without hematuria: Secondary | ICD-10-CM | POA: Diagnosis not present

## 2021-04-30 DIAGNOSIS — R739 Hyperglycemia, unspecified: Secondary | ICD-10-CM | POA: Diagnosis not present

## 2021-04-30 DIAGNOSIS — Z7982 Long term (current) use of aspirin: Secondary | ICD-10-CM | POA: Insufficient documentation

## 2021-04-30 DIAGNOSIS — Z7984 Long term (current) use of oral hypoglycemic drugs: Secondary | ICD-10-CM | POA: Diagnosis not present

## 2021-04-30 DIAGNOSIS — R81 Glycosuria: Secondary | ICD-10-CM

## 2021-04-30 DIAGNOSIS — R300 Dysuria: Secondary | ICD-10-CM | POA: Diagnosis present

## 2021-04-30 DIAGNOSIS — R319 Hematuria, unspecified: Secondary | ICD-10-CM | POA: Diagnosis not present

## 2021-04-30 DIAGNOSIS — I119 Hypertensive heart disease without heart failure: Secondary | ICD-10-CM | POA: Diagnosis not present

## 2021-04-30 LAB — POCT URINALYSIS DIP (MANUAL ENTRY)
Blood, UA: NEGATIVE
Glucose, UA: 500 mg/dL — AB
Nitrite, UA: POSITIVE — AB
Protein Ur, POC: 100 mg/dL — AB
Spec Grav, UA: 1.025 (ref 1.010–1.025)
Urobilinogen, UA: 4 E.U./dL — AB
pH, UA: 5 (ref 5.0–8.0)

## 2021-04-30 LAB — POCT FASTING CBG KUC MANUAL ENTRY: POCT Glucose (KUC): 352 mg/dL — AB (ref 70–99)

## 2021-04-30 LAB — CBG MONITORING, ED
Glucose-Capillary: 301 mg/dL — ABNORMAL HIGH (ref 70–99)
Glucose-Capillary: 140 mg/dL — ABNORMAL HIGH (ref 70–99)

## 2021-04-30 LAB — CBC
HCT: 39 % (ref 36.0–46.0)
Hemoglobin: 13.2 g/dL (ref 12.0–15.0)
MCH: 31.5 pg (ref 26.0–34.0)
MCHC: 33.8 g/dL (ref 30.0–36.0)
MCV: 93.1 fL (ref 80.0–100.0)
Platelets: 197 10*3/uL (ref 150–400)
RBC: 4.19 MIL/uL (ref 3.87–5.11)
RDW: 12.6 % (ref 11.5–15.5)
WBC: 5.2 10*3/uL (ref 4.0–10.5)
nRBC: 0 % (ref 0.0–0.2)

## 2021-04-30 LAB — BASIC METABOLIC PANEL
Anion gap: 10 (ref 5–15)
BUN: 19 mg/dL (ref 8–23)
CO2: 26 mmol/L (ref 22–32)
Calcium: 9 mg/dL (ref 8.9–10.3)
Chloride: 101 mmol/L (ref 98–111)
Creatinine, Ser: 1.12 mg/dL — ABNORMAL HIGH (ref 0.44–1.00)
GFR, Estimated: 48 mL/min — ABNORMAL LOW (ref >60)
Glucose, Bld: 334 mg/dL — ABNORMAL HIGH (ref 70–99)
Potassium: 4.6 mmol/L (ref 3.5–5.1)
Sodium: 137 mmol/L (ref 135–145)

## 2021-04-30 LAB — URINALYSIS, COMPLETE (UACMP) WITH MICROSCOPIC
Bacteria, UA: NONE SEEN
Bilirubin Urine: NEGATIVE
Glucose, UA: >=500 mg/dL — AB
Hgb urine dipstick: NEGATIVE
Ketones, ur: NEGATIVE mg/dL
Leukocytes,Ua: NEGATIVE
Nitrite: POSITIVE — AB
Protein, ur: 30 mg/dL — AB
Specific Gravity, Urine: 1.027 (ref 1.005–1.030)
WBC, UA: >50 WBC/hpf — ABNORMAL HIGH (ref 0–5)
pH: 5 (ref 5.0–8.0)

## 2021-04-30 MED ORDER — CEPHALEXIN 500 MG PO CAPS: 500.0000 mg | ORAL_CAPSULE | Freq: Four times a day (QID) | ORAL | 0 refills | Status: DC

## 2021-04-30 MED ORDER — AMOXICILLIN 500 MG PO TABS: 500.0000 mg | ORAL_TABLET | Freq: Two times a day (BID) | ORAL | 0 refills | Status: AC

## 2021-04-30 NOTE — Discharge Instructions (Signed)
Go to the emergency department for evaluation of your elevated blood sugar of 352.

## 2021-04-30 NOTE — Telephone Encounter (Signed)
Noted - pt was directed to ER due to hyperglycemia CBG 352

## 2021-04-30 NOTE — ED Triage Notes (Signed)
Pt to ED via POV with c/o UTI and hyperglycemia. Pt states at [facility] CBG 352. Pt states takes glipizide for diabetes. Pt states was having pain, however pain resolved when patient started taking OTC Azo.

## 2021-04-30 NOTE — ED Triage Notes (Signed)
Pt reports having dysuria and burning with urination that began this morning. She have been using AZO.

## 2021-04-30 NOTE — Telephone Encounter (Signed)
Per chart review tab pt went to [facility] in [place].

## 2021-04-30 NOTE — ED Provider Notes (Signed)
[facility] Emergency Department Provider Note ____________________________________________   Event Date/Time   First MD Initiated Contact with Patient 04/30/21 1707     (approximate)  I have reviewed the triage vital signs and the nursing notes.  HISTORY  Chief Complaint Hyperglycemia and Dysuria   HPI [name] [name] is a 87 y.o. femalewho presents to the ED for evaluation of dysuria and hyperglycemia.   Chart review indicates hx CAD s/p DES, DM on oral meds.   Patient sent to the ED via POV for evaluation of acute cystitis.  She reports dysuria throughout the day today with urinary frequency, sensation of incomplete emptying and new incontinence of urine.  Denies abdominal pain, emesis, fever, flank pain, syncopal episodes, chest pain.  Reports going to urgent care to get antibiotics for likely UTI, but be directed to the ED for evaluation of hyperglycemia while at urgent care.  Noted to have a CBG of about 350.  Fingerstick recheck here is 140.  She reports that she feels fine.  She tells me at length about her recently deceased husband.  She denies suicidality.  Has family to help her out.  Past Medical History:  Diagnosis Date  . Acute bilateral thoracic back pain 08/16/2019  . Allergy    Cipro, Plavix, Statins  . CAD (coronary artery disease)    a. s/p tandem Promus DES to LAD in 2009 by Dr. Olevia Perches;  b.  LHC (03/22/14):  prox LAD 30%, mid LAD 40%, LAD stent ok with dist 20% ISR, apical LAD occluded with L-L collats filling apical vessel (too small for PCI), mid RCA 20%, EF 70%.  Med Rx  . Diabetes mellitus    Diagnosed 2012  . Diverticulosis of colon (without mention of hemorrhage)   . GERD (gastroesophageal reflux disease)   . Herpes zoster without complication 2/69/4854  . Hx of cardiovascular stress test    a. Nuclear (08/2013):  No ischemia, EF 80%, Normal  . Hx of echocardiogram    a. Echo (07/2013):  Mild LVH, vigorous LVF, EF 65-70%, Gr 1  DD, mild MR, mild to mod LAE  . Hyperlipidemia    intol of statins  . Hypertension   . NHL (non-Hodgkin's lymphoma) ([place]) dx'd 2003   chemo/xrt comp 2003  . Personal history of colonic polyps   . Proctitis   . Pruritic intertrigo 07/18/2014  . Urticaria     Patient Active Problem List   Diagnosis Date Noted  . Left lower quadrant abdominal pain 01/21/2021  . Skin mass 09/11/2020  . Bilateral lower extremity edema 03/26/2020  . Preventative health care 06/05/2019  . Shoulder pain 03/13/2019  . Tingling 06/29/2018  . Recurrent UTI 01/12/2018  . Neck pain 09/27/2017  . Insomnia 09/27/2017  . Stress due to illness of family member 05/04/2016  . Pain in joints of both feet 01/14/2016  . Osteopenia 08/06/2015  . Constipation 12/30/2014  . B12 deficiency 12/30/2014  . Allergic reaction 09/02/2014  . Arthralgia of right foot 07/18/2014  . Right hip pain 06/04/2014  . Chronic fatigue 06/04/2014  . Elevated LFTs 04/19/2014  . Mitral regurgitation 04/19/2014  . Pes planus 06/13/2013  . Renal insufficiency 06/07/2011  . Type 2 diabetes mellitus ([place]) 05/31/2011  . MIXED HYPERLIPIDEMIA 08/04/2010  . LUMBAR SPRAIN AND STRAIN 01/15/2010  . 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 02/25/2009  . EXTERNAL HEMORRHOIDS 02/24/2009  . DIVERTICULOSIS, COLON 02/24/2009  . LYMPHOMA, MALT 08/28/2007  . DETACHED RETINA 08/28/2007  . Essential  hypertension 08/28/2007  . Coronary atherosclerosis 08/28/2007  . MITRAL VALVE PROLAPSE 08/28/2007  . GERD 08/28/2007  . IBS 08/28/2007  . BREAST CYSTS, BILATERAL 08/28/2007    Past Surgical History:  Procedure Laterality Date  . cardiac cath-neg    . CORONARY ANGIOPLASTY WITH STENT PLACEMENT     x 2  . dexa-neg    . KNEE SURGERY  10/2003   left  . laser surgery for cataracts-left    . LEFT HEART CATHETERIZATION WITH CORONARY ANGIOGRAM N/A 03/22/2014   Procedure: LEFT HEART CATHETERIZATION WITH CORONARY ANGIOGRAM;  Surgeon: Burnell Blanks, MD;  Location: [facility] CATH LAB;  Service: Cardiovascular;  Laterality: N/A;  . stress cardiolite    . VAGINAL HYSTERECTOMY     partial, fibroids, one ovary left    Prior to Admission medications   Medication Sig Start Date End Date Taking? Authorizing Provider  cephALEXin (KEFLEX) 500 MG capsule Take 1 capsule (500 mg total) by mouth 4 (four) times daily for 5 days. 04/30/21 05/05/21 Yes Vladimir Crofts, MD  amoxicillin-clavulanate (AUGMENTIN) 875-125 MG tablet Take 1 tablet by mouth 2 (two) times daily. 01/21/21   Pleas Koch, NP  aspirin 81 MG tablet Take 81 mg by mouth daily.    [provider]  Blood Glucose Calibration (TRUE METRIX LEVEL 1) Low SOLN Use to calibrate meter 11/14/20   Pleas Koch, NP  Blood Glucose Monitoring Suppl (TRUE METRIX METER) DEVI Test blood sugars once or twice daily for diabetes. 10/28/20   Pleas Koch, NP  brimonidine-timolol (COMBIGAN) 0.2-0.5 % ophthalmic solution Place 1 drop into both eyes every 12 (twelve) hours.    [provider]  Cholecalciferol (VITAMIN D-3) 1000 UNITS CAPS Take 1 capsule by mouth daily.    [provider]  ezetimibe (ZETIA) 10 MG tablet Take 1 tablet (10 mg total) by mouth daily. 10/21/20   Burnell Blanks, MD  fexofenadine (ALLEGRA) 180 MG tablet Take 1 tablet (180 mg total) by mouth as needed for allergies or rhinitis. 04/03/19   Pleas Koch, NP  furosemide (LASIX) 20 MG tablet TAKE ONE TABLET BY MOUTH DAILY FOR LEG SWELLING 10/27/20   Pleas Koch, NP  gabapentin (NEURONTIN) 100 MG capsule Take 1 capsule (100 mg total) by mouth 3 (three) times daily. 10/31/20   Edrick Kins, DPM  glipiZIDE (GLUCOTROL XL) 10 MG 24 hr tablet TAKE ONE TABLET BY MOUTH EVERY MORNING WITH BREAKFAST for diabetes. 04/14/21   Pleas Koch, NP  glucose blood (RELION TRUE METRIX TEST STRIPS) test strip Test blood sugars once to twice daily for diabetes. 10/28/20   Pleas Koch, NP   isosorbide mononitrate (IMDUR) 60 MG 24 hr tablet Take 1 tablet (60 mg total) by mouth daily. 10/21/20   Burnell Blanks, MD  metoprolol succinate (TOPROL-XL) 100 MG 24 hr tablet TAKE ONE TABLET BY MOUTH EVERY MORNING WITH OR IMMEDIATELY FOLLOWING A MEAL 10/21/20   Burnell Blanks, MD  nitroGLYCERIN (NITROSTAT) 0.4 MG SL tablet Place 1 tablet (0.4 mg total) under the tongue every 5 (five) minutes as needed for chest pain. 08/02/18   Pleas Koch, NP  OVER THE COUNTER MEDICATION Take 1 tablet by mouth daily. Med name: VISION FORMULA    [provider]  pantoprazole (PROTONIX) 40 MG tablet TAKE ONE TABLET BY MOUTH DAILY FOR HEARTBURN 04/14/21   Pleas Koch, NP  ReliOn Lancets Micro-Thin 33G MISC Test blood sugars once to twice daily for diabetes. 10/28/20  Pleas Koch, NP  sertraline (ZOLOFT) 50 MG tablet Take 1 tablet (50 mg total) by mouth daily. For anxiety. 10/28/20   Pleas Koch, NP  timolol (TIMOPTIC) 0.5 % ophthalmic solution  02/21/18   [provider]  trimethoprim (TRIMPEX) 100 MG tablet Take 100 mg by mouth 2 (two) times daily as needed (UTI).  04/12/16   [provider]  vitamin B-12 (CYANOCOBALAMIN) 1000 MCG tablet Take 1,000 mcg by mouth daily.    [provider]  loratadine (CLARITIN) 10 MG tablet Take 10 mg by mouth as needed.   02/15/12  [provider]    Allergies Other, Shellfish allergy, Cephalexin, Ciprofloxacin, Clopidogrel bisulfate, Codeine phosphate, Ezetimibe-simvastatin, Hydrocod polst-cpm polst er, Lidocaine, Nitrofurantoin, Pregabalin, Propoxyphene n-acetaminophen, Rofecoxib, Sulfamethoxazole, Sulfonamide derivatives, Celecoxib, Codeine, Hydrocod polst-cpm polst er, Propoxyphene, and Sulfa antibiotics  Family History  Problem Relation Age of Onset  . Cancer Mother        jaw  . Hypertension Father   . Heart attack Father   . Heart attack Sister   . Colon cancer Neg Hx     Social  History Social History   Tobacco Use  . Smoking status: Never Smoker  . Smokeless tobacco: Never Used  Vaping Use  . Vaping Use: Never used  Substance Use Topics  . Alcohol use: No  . Drug use: No    Review of Systems  Constitutional: No fever/chills Eyes: No visual changes. ENT: No sore throat. Cardiovascular: Denies chest pain. Respiratory: Denies shortness of breath. Gastrointestinal: No abdominal pain.  No nausea, no vomiting.  No diarrhea.  No constipation. Genitourinary: Positive dysuria and frequency. Musculoskeletal: Negative for back pain. Skin: Negative for rash. Neurological: Negative for headaches, focal weakness or numbness.  ____________________________________________   PHYSICAL EXAM:  VITAL SIGNS: Vitals:   04/30/21 1441  BP: 115/67  Pulse: 74  Resp: 18  Temp: 98.3 F (36.8 C)  SpO2: 95%      Constitutional: Alert and oriented. Well appearing and in no acute distress. Eyes: Conjunctivae are normal. PERRL. EOMI. Head: Atraumatic. Nose: No congestion/rhinnorhea. Mouth/Throat: Mucous membranes are moist.  Oropharynx non-erythematous. Neck: No stridor. No cervical spine tenderness to palpation. Cardiovascular: Normal rate, regular rhythm. Grossly normal heart sounds.  Good peripheral circulation. Respiratory: Normal respiratory effort.  No retractions. Lungs CTAB. Gastrointestinal: Soft , nondistended,. No CVA tenderness. Minimal suprapubic tenderness to palpation without peritoneal features.  Abdomen is otherwise benign. Musculoskeletal: No lower extremity tenderness nor edema.  No joint effusions. No signs of acute trauma. Neurologic:  Normal speech and language. No gross focal neurologic deficits are appreciated. No gait instability noted. Skin:  Skin is warm, dry and intact. No rash noted. Psychiatric: Mood and affect are normal. Speech and behavior are normal.  ____________________________________________   LABS (all labs ordered are  listed, but only abnormal results are displayed)  Labs Reviewed  BASIC METABOLIC PANEL - Abnormal; Notable for the following components:      Result Value   Glucose, Bld 334 (*)    Creatinine, Ser 1.12 (*)    GFR, Estimated 48 (*)    All other components within normal limits  CBG MONITORING, ED - Abnormal; Notable for the following components:   Glucose-Capillary 301 (*)    All other components within normal limits  CBG MONITORING, ED - Abnormal; Notable for the following components:   Glucose-Capillary 140 (*)    All other components within normal limits  URINE CULTURE  CBC  URINALYSIS, COMPLETE (UACMP) WITH MICROSCOPIC  ____________________________________________   PROCEDURES and INTERVENTIONS  Procedure(s) performed (including Critical Care):  Procedures  Medications - No data to display  ____________________________________________   MDM / ED COURSE   Pleasant 87 year old woman who presents to the ED with evidence of acute cystitis amenable to outpatient management.  Hyperglycemic at urgent care, self improving here in the ED to 140 without intervention.  I see no barriers to outpatient management.  See no evidence of sepsis, pyelonephritis or further acute derangements.  We will discharge with prescription for Keflex and send her urine for culture.  Return precautions for the ED were discussed.     ____________________________________________   FINAL CLINICAL IMPRESSION(S) / ED DIAGNOSES  Final diagnoses:  Acute cystitis without hematuria     ED Discharge Orders         Ordered    cephALEXin (KEFLEX) 500 MG capsule  4 times daily        04/30/21 1750           Arnel Wymer Tamala Julian   Note:  This document was prepared using Systems analyst and may include unintentional dictation errors.   Vladimir Crofts, MD 04/30/21 [phone]

## 2021-04-30 NOTE — Telephone Encounter (Signed)
For right now I dont think so.

## 2021-04-30 NOTE — Telephone Encounter (Signed)
Mrs. [name] [name] called in wanted to know if she can get something for an UTI, she knows what it is she just needs something. She has taken the avo and it has numbed it so it wouldn't hurt as bad.   Please advise

## 2021-04-30 NOTE — Telephone Encounter (Signed)
Got her in touch with a nurse from access

## 2021-04-30 NOTE — ED Provider Notes (Signed)
[name] [name]    CSN: [id number] Arrival date & time: 04/30/21  1328      History   Chief Complaint Chief Complaint  Patient presents with  . Dysuria    HPI [name] [name] is a 87 y.o. female.   Patient presents with dysuria since this morning.  She describes the dysuria as burning with urination.  Treatment attempted at home with Azo.  No fever, abdominal pain, or other symptoms.  Her medical history includes hypertension, CAD, diabetes, renal insufficiency, elevated LFTs, lymphoma.  The history is provided by the patient and medical records.    Past Medical History:  Diagnosis Date  . Acute bilateral thoracic back pain 08/16/2019  . Allergy    Cipro, Plavix, Statins  . CAD (coronary artery disease)    a. s/p tandem Promus DES to LAD in 2009 by Dr. Olevia Perches;  b.  LHC (03/22/14):  prox LAD 30%, mid LAD 40%, LAD stent ok with dist 20% ISR, apical LAD occluded with L-L collats filling apical vessel (too small for PCI), mid RCA 20%, EF 70%.  Med Rx  . Diabetes mellitus    Diagnosed 2012  . Diverticulosis of colon (without mention of hemorrhage)   . GERD (gastroesophageal reflux disease)   . Herpes zoster without complication 05/24/9484  . Hx of cardiovascular stress test    a. Nuclear (08/2013):  No ischemia, EF 80%, Normal  . Hx of echocardiogram    a. Echo (07/2013):  Mild LVH, vigorous LVF, EF 65-70%, Gr 1 DD, mild MR, mild to mod LAE  . Hyperlipidemia    intol of statins  . Hypertension   . NHL (non-Hodgkin's lymphoma) ([place]) dx'd 2003   chemo/xrt comp 2003  . Personal history of colonic polyps   . Proctitis   . Pruritic intertrigo 07/18/2014  . Urticaria     Patient Active Problem List   Diagnosis Date Noted  . Left lower quadrant abdominal pain 01/21/2021  . Skin mass 09/11/2020  . Bilateral lower extremity edema 03/26/2020  . Preventative health care 06/05/2019  . Shoulder pain 03/13/2019  . Tingling 06/29/2018  . Recurrent UTI 01/12/2018  . Neck pain  09/27/2017  . Insomnia 09/27/2017  . Stress due to illness of family member 05/04/2016  . Pain in joints of both feet 01/14/2016  . Osteopenia 08/06/2015  . Constipation 12/30/2014  . B12 deficiency 12/30/2014  . Allergic reaction 09/02/2014  . Arthralgia of right foot 07/18/2014  . Right hip pain 06/04/2014  . Chronic fatigue 06/04/2014  . Elevated LFTs 04/19/2014  . Mitral regurgitation 04/19/2014  . Pes planus 06/13/2013  . Renal insufficiency 06/07/2011  . Type 2 diabetes mellitus ([place]) 05/31/2011  . MIXED HYPERLIPIDEMIA 08/04/2010  . LUMBAR SPRAIN AND STRAIN 01/15/2010  . 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 02/25/2009  . EXTERNAL HEMORRHOIDS 02/24/2009  . DIVERTICULOSIS, COLON 02/24/2009  . LYMPHOMA, MALT 08/28/2007  . DETACHED RETINA 08/28/2007  . Essential hypertension 08/28/2007  . Coronary atherosclerosis 08/28/2007  . MITRAL VALVE PROLAPSE 08/28/2007  . GERD 08/28/2007  . IBS 08/28/2007  . BREAST CYSTS, BILATERAL 08/28/2007    Past Surgical History:  Procedure Laterality Date  . cardiac cath-neg    . CORONARY ANGIOPLASTY WITH STENT PLACEMENT     x 2  . dexa-neg    . KNEE SURGERY  10/2003   left  . laser surgery for cataracts-left    . LEFT HEART CATHETERIZATION WITH CORONARY ANGIOGRAM N/A 03/22/2014   Procedure: LEFT HEART CATHETERIZATION  WITH CORONARY ANGIOGRAM;  Surgeon: Burnell Blanks, MD;  Location: [facility] CATH LAB;  Service: Cardiovascular;  Laterality: N/A;  . stress cardiolite    . VAGINAL HYSTERECTOMY     partial, fibroids, one ovary left    OB History   No obstetric history on file.      Home Medications    Prior to Admission medications   Medication Sig Start Date End Date Taking? Authorizing Provider  amoxicillin-clavulanate (AUGMENTIN) 875-125 MG tablet Take 1 tablet by mouth 2 (two) times daily. 01/21/21   Pleas Koch, NP  aspirin 81 MG tablet Take 81 mg by mouth daily.    [provider]  Blood Glucose  Calibration (TRUE METRIX LEVEL 1) Low SOLN Use to calibrate meter 11/14/20   Pleas Koch, NP  Blood Glucose Monitoring Suppl (TRUE METRIX METER) DEVI Test blood sugars once or twice daily for diabetes. 10/28/20   Pleas Koch, NP  brimonidine-timolol (COMBIGAN) 0.2-0.5 % ophthalmic solution Place 1 drop into both eyes every 12 (twelve) hours.    [provider]  Cholecalciferol (VITAMIN D-3) 1000 UNITS CAPS Take 1 capsule by mouth daily.    [provider]  ezetimibe (ZETIA) 10 MG tablet Take 1 tablet (10 mg total) by mouth daily. 10/21/20   Burnell Blanks, MD  fexofenadine (ALLEGRA) 180 MG tablet Take 1 tablet (180 mg total) by mouth as needed for allergies or rhinitis. 04/03/19   Pleas Koch, NP  furosemide (LASIX) 20 MG tablet TAKE ONE TABLET BY MOUTH DAILY FOR LEG SWELLING 10/27/20   Pleas Koch, NP  gabapentin (NEURONTIN) 100 MG capsule Take 1 capsule (100 mg total) by mouth 3 (three) times daily. 10/31/20   Edrick Kins, DPM  glipiZIDE (GLUCOTROL XL) 10 MG 24 hr tablet TAKE ONE TABLET BY MOUTH EVERY MORNING WITH BREAKFAST for diabetes. 04/14/21   Pleas Koch, NP  glucose blood (RELION TRUE METRIX TEST STRIPS) test strip Test blood sugars once to twice daily for diabetes. 10/28/20   Pleas Koch, NP  isosorbide mononitrate (IMDUR) 60 MG 24 hr tablet Take 1 tablet (60 mg total) by mouth daily. 10/21/20   Burnell Blanks, MD  metoprolol succinate (TOPROL-XL) 100 MG 24 hr tablet TAKE ONE TABLET BY MOUTH EVERY MORNING WITH OR IMMEDIATELY FOLLOWING A MEAL 10/21/20   Burnell Blanks, MD  nitroGLYCERIN (NITROSTAT) 0.4 MG SL tablet Place 1 tablet (0.4 mg total) under the tongue every 5 (five) minutes as needed for chest pain. 08/02/18   Pleas Koch, NP  OVER THE COUNTER MEDICATION Take 1 tablet by mouth daily. Med name: VISION FORMULA    [provider]  pantoprazole (PROTONIX) 40 MG tablet TAKE ONE TABLET BY  MOUTH DAILY FOR HEARTBURN 04/14/21   Pleas Koch, NP  ReliOn Lancets Micro-Thin 33G MISC Test blood sugars once to twice daily for diabetes. 10/28/20   Pleas Koch, NP  sertraline (ZOLOFT) 50 MG tablet Take 1 tablet (50 mg total) by mouth daily. For anxiety. 10/28/20   Pleas Koch, NP  timolol (TIMOPTIC) 0.5 % ophthalmic solution  02/21/18   [provider]  trimethoprim (TRIMPEX) 100 MG tablet Take 100 mg by mouth 2 (two) times daily as needed (UTI).  04/12/16   [provider]  vitamin B-12 (CYANOCOBALAMIN) 1000 MCG tablet Take 1,000 mcg by mouth daily.    [provider]  loratadine (CLARITIN) 10 MG tablet Take 10 mg by mouth as needed.  02/15/12  [provider]    Family History Family History  Problem Relation Age of Onset  . Cancer Mother        jaw  . Hypertension Father   . Heart attack Father   . Heart attack Sister   . Colon cancer Neg Hx     Social History Social History   Tobacco Use  . Smoking status: Never Smoker  . Smokeless tobacco: Never Used  Vaping Use  . Vaping Use: Never used  Substance Use Topics  . Alcohol use: No  . Drug use: No     Allergies   Other, Shellfish allergy, Cephalexin, Ciprofloxacin, Clopidogrel bisulfate, Codeine phosphate, Ezetimibe-simvastatin, Hydrocod polst-cpm polst er, Lidocaine, Nitrofurantoin, Pregabalin, Propoxyphene n-acetaminophen, Rofecoxib, Sulfamethoxazole, Sulfonamide derivatives, Celecoxib, Codeine, Hydrocod polst-cpm polst er, Propoxyphene, and Sulfa antibiotics   Review of Systems Review of Systems  Constitutional: Negative for chills and fever.  Respiratory: Negative for cough and shortness of breath.   Cardiovascular: Negative for chest pain and palpitations.  Gastrointestinal: Negative for abdominal pain and vomiting.  Genitourinary: Positive for dysuria. Negative for hematuria.  Skin: Negative for color change and rash.  All other systems reviewed and are  negative.    Physical Exam Triage Vital Signs ED Triage Vitals  Enc Vitals Group     BP      Pulse      Resp      Temp      Temp src      SpO2      Weight      Height      Head Circumference      Peak Flow      Pain Score      Pain Loc      Pain Edu?      Excl. in [place]?    No data found.  Updated Vital Signs BP (!) 142/73   Pulse 73   Temp 98.7 F (37.1 C) (Oral)   Resp 18   Ht 5\' 9"  (1.753 m)   Wt 169 lb (76.7 kg)   SpO2 95%   BMI 24.96 kg/m   Visual Acuity Right Eye Distance:   Left Eye Distance:   Bilateral Distance:    Right Eye Near:   Left Eye Near:    Bilateral Near:     Physical Exam Vitals and nursing note reviewed.  Constitutional:      General: She is not in acute distress.    Appearance: She is well-developed.  HENT:     Head: Normocephalic and atraumatic.     Mouth/Throat:     Mouth: Mucous membranes are moist.  Eyes:     Conjunctiva/sclera: Conjunctivae normal.  Cardiovascular:     Rate and Rhythm: Normal rate and regular rhythm.     Heart sounds: Normal heart sounds.  Pulmonary:     Effort: Pulmonary effort is normal. No respiratory distress.     Breath sounds: Normal breath sounds.  Abdominal:     Palpations: Abdomen is soft.     Tenderness: There is no abdominal tenderness. There is no right CVA tenderness, left CVA tenderness, guarding or rebound.  Musculoskeletal:     Cervical back: Neck supple.  Skin:    General: Skin is warm and dry.  Neurological:     General: No focal deficit present.     Mental Status: She is alert.     Gait: Gait normal.  Psychiatric:        Mood and Affect: Mood  normal.        Behavior: Behavior normal.      UC Treatments / Results  Labs (all labs ordered are listed, but only abnormal results are displayed) Labs Reviewed  POCT URINALYSIS DIP (MANUAL ENTRY) - Abnormal; Notable for the following components:      Result Value   Color, UA orange (*)    Glucose, UA =500 (*)    Bilirubin, UA  small (*)    Ketones, POC UA small (15) (*)    Protein Ur, POC =100 (*)    Urobilinogen, UA 4.0 (*)    Nitrite, UA Positive (*)    Leukocytes, UA Large (3+) (*)    All other components within normal limits  POCT FASTING CBG [facility] MANUAL ENTRY - Abnormal; Notable for the following components:   POCT Glucose ([facility]) 352 (*)    All other components within normal limits    EKG   Radiology No results found.  Procedures Procedures (including critical care time)  Medications Ordered in UC Medications - No data to display  Initial Impression / Assessment and Plan / UC Course  I have reviewed the triage vital signs and the nursing notes.  Pertinent labs & imaging results that were available during my care of the patient were reviewed by me and considered in my medical decision making (see chart for details).   Hyperglycemia, glucosuria, UTI.  CBG 352.  Patient is diabetic and managed with oral medication.  Her urine does appear to be positive for UTI.  Discussed with patient that, because her blood sugar is so elevated, she will need to be seen in the ED for evaluation.  I feel that she needs a higher level of care due to hyperglycemia.  Her daughter drove her here and will drive her to the ED.  She feels stable for transport by POV.   Final Clinical Impressions(s) / UC Diagnoses   Final diagnoses:  Hyperglycemia  Urinary tract infection with hematuria, site unspecified  Glucosuria     Discharge Instructions     Go to the emergency department for evaluation of your elevated blood sugar of 352.    ED Prescriptions    None     PDMP not reviewed this encounter.   Sharion Balloon, NP 04/30/21 [phone]

## 2021-04-30 NOTE — ED Notes (Signed)
ED Provider at bedside. 

## 2021-04-30 NOTE — Telephone Encounter (Signed)
[name] [name] - Client TELEPHONE ADVICE RECORD [facility] Patient Name: [name] [name] ANN Gender: Female DOB: [birth year] Age: 87 Y 2 M 30 D Return Phone Number: [phone] (Primary), [phone] (Secondary) Address: [place] [address] Client [place] [name] - Client Client Site [place] - [name] Physician [name] - NP Contact Type Call Who Is Calling Patient / Member / Family / Caregiver Call Type Triage / Clinical Relationship To Patient Self Return Phone Number [phone] (Primary) Chief Complaint Urination Pain Reason for Call Symptomatic / Request for [place] states she has a UTI and burning when she urinates. She tried AZO, but she would like more advice. Translation No Nurse Assessment Nurse: Ronnald Ramp, RN, Miranda Date/Time (Eastern Time): 04/30/2021 11:52:29 AM Confirm and document reason for call. If symptomatic, describe symptoms. ---Caller states she is having urinary frequency and had pain when she urinates. Symptoms started overnight. Does the patient have any new or worsening symptoms? ---Yes Will a triage be completed? ---Yes Related visit to physician within the last 2 weeks? ---No Does the PT have any chronic conditions? (i.e. diabetes, asthma, this includes High risk factors for pregnancy, etc.) ---Yes List chronic conditions. ---HTN, Neuropathy, Diabetes, Heart Is this a behavioral health or substance abuse call? ---No Guidelines Guideline Title Affirmed Question Affirmed Notes Nurse Date/Time Eilene Ghazi Time) Urination Pain - Female Diabetes mellitus or weak immune system (e.g., HIV positive, cancer chemo, splenectomy, organ transplant, chronic steroids) Ronnald Ramp, RN, Miranda 04/30/2021 11:54:41 AM Disp. Time Eilene Ghazi Time) Disposition Final User 04/30/2021 11:42:13 AM Send To RN Personal Dew, RN, Osborne Casco NOTE:  All timestamps contained within this report are represented as [place] Standard Time. CONFIDENTIALTY NOTICE: This fax transmission is intended only for the addressee. It contains information that is legally privileged, confidential or otherwise protected from use or disclosure. If you are not the intended recipient, you are strictly prohibited from reviewing, disclosing, copying using or disseminating any of this information or taking any action in reliance on or regarding this information. If you have received this fax in error, please notify us immediately by telephone so that we can arrange for its return to [place]. Phone: [phone], Toll-Free: [phone], Fax: [phone] Page: 2 of 2 Call Id: [id number] 04/30/2021 11:58:57 AM See HCP within 4 Hours (or PCP triage) Yes Ronnald Ramp, RN, Miranda Caller Disagree/Comply Comply Caller Understands Yes PreDisposition Call Doctor Care Advice Given Per Guideline SEE HCP (OR PCP TRIAGE) WITHIN 4 HOURS: CALL BACK IF: * You become worse CARE ADVICE given per Urination Pain - Female (Adult) guideline. * IF OFFICE WILL BE OPEN: You need to be seen within the next 3 or 4 hours. Call your doctor (or NP/PA) now or as soon as the office opens. Comments User: Leverne Humbles, RN Date/Time Eilene Ghazi Time): 04/30/2021 12:10:31 PM Called backline for an appt per client directives but there was no answers. Called main number and no appts available there or at nearest office [place]. Pt referred to UC. Referrals GO TO FACILITY UNDECIDED

## 2021-04-30 NOTE — Discharge Instructions (Signed)
You are being discharged with Keflex antibiotics to take 4 times daily for the next 5 days.  Please finish all 20 pills, even if you are feeling better.  Return to the ED with any fevers or worsening symptoms despite these measures.  Use Tylenol for pain and fevers.  Up to 1000 mg per dose, up to 4 times per day.  Do not take more than 4000 mg of Tylenol/acetaminophen within 24 hours.Marland Kitchen

## 2021-04-30 NOTE — ED Notes (Addendum)
Patient is being discharged from the Urgent Care and sent to the Emergency Department via pov. Per K. Hall Busing,  patient is in need of higher level of care due to elevated glucose. Patient is aware and verbalizes understanding of plan of care.  Vitals:   04/30/21 1349  BP: (!) 142/73  Pulse: 73  Resp: 18  Temp: 98.7 F (37.1 C)  SpO2: 95%   CBG 352

## 2021-05-03 LAB — URINE CULTURE: Culture: >=100000 — AB

## 2021-05-04 ENCOUNTER — Encounter: Payer: Self-pay | Admitting: Primary Care

## 2021-05-08 ENCOUNTER — Ambulatory Visit: Payer: Medicare PPO | Admitting: Primary Care

## 2021-05-08 ENCOUNTER — Other Ambulatory Visit: Payer: Self-pay

## 2021-05-08 ENCOUNTER — Telehealth: Payer: Self-pay | Admitting: *Deleted

## 2021-05-08 DIAGNOSIS — L509 Urticaria, unspecified: Secondary | ICD-10-CM

## 2021-05-08 MED ORDER — METHYLPREDNISOLONE ACETATE 80 MG/ML IJ SUSP
80.0000 mg | Freq: Once | INTRAMUSCULAR | Status: AC
  Administered 2021-05-08: 80 mg via INTRAMUSCULAR

## 2021-05-08 MED ORDER — PREDNISONE 20 MG PO TABS: ORAL_TABLET | ORAL | 0 refills | Status: DC

## 2021-05-08 NOTE — Assessment & Plan Note (Addendum)
Full body without respiratory symptoms.  Lungs clear, no distress.   Suspect amoxicillin to be culprit as nothing else has changed. Will add to allergy list.  IM Depo Medrol 80 mg provided today.  Rx for prednisone course sent to pharmacy to start tomorrow.  Start Allegra. Strict ED precautions provided.

## 2021-05-08 NOTE — Addendum Note (Signed)
Addended by: Loreen Freud on: 05/08/2021 01:03 PM   Modules accepted: Orders

## 2021-05-08 NOTE — Patient Instructions (Signed)
Start prednisone 20 mg tablets tomorrow. Take 2 tablets by mouth once daily for 5 days.  Start taking Allegra, Claritin, Zyrtec, or Xyzal once daily.  Please go to the hospital if no improvement.   It was a pleasure to see you today!

## 2021-05-08 NOTE — Telephone Encounter (Signed)
Patient has an appointment scheduled today with Allie Bossier NP at 12:20 pm.

## 2021-05-08 NOTE — Telephone Encounter (Signed)
PLEASE NOTE: All timestamps contained within this report are represented as [place] Standard Time. CONFIDENTIALTY NOTICE: This fax transmission is intended only for the addressee. It contains information that is legally privileged, confidential or otherwise protected from use or disclosure. If you are not the intended recipient, you are strictly prohibited from reviewing, disclosing, copying using or disseminating any of this information or taking any action in reliance on or regarding this information. If you have received this fax in error, please notify us immediately by telephone so that we can arrange for its return to [place]. Phone: [phone], Toll-Free: [phone], Fax: [phone] Page: 1 of 2 Call Id: [id number] [name] [name] RECORD [facility] Patient Name: [name] [name] ANN Gender: Female DOB: 1932/10/04 Age: 87 Y 3 M 7 D Return Phone Number: [phone] (Primary), [phone] (Secondary) Address: [name] [address] Client [facility] - Client Client Site [name] [name] Physician [name] [name] - NP Contact Type Call Who Is Calling Patient / Member / Family / Caregiver Call Type Triage / Clinical Relationship To Patient Self Return Phone Number [phone] (Primary) Chief Complaint Hives Reason for Call Symptomatic / Request for [place] states she has hives all over her this morning. They are itching. Additional Comment Can she take Benadryl? Translation No Nurse Assessment Nurse: [name] Sa, RN, [name] [name] Date/Time (Eastern Time): 05/08/2021 7:02:53 AM Confirm and document reason for call. If symptomatic, describe symptoms. ---[name] [name] states she developed widespread hives this morning. No severe breathing or swallowing difficulty. No fever. No wheezing. Alert and responsive. Does the patient have any new or  worsening symptoms? ---Yes Will a triage be completed? ---Yes Related visit to physician within the last 2 weeks? ---No Does the PT have any chronic conditions? (i.e. diabetes, asthma, this includes High risk factors for pregnancy, etc.) ---Yes List chronic conditions. ---Heart problems, Diabetes Is this a behavioral health or substance abuse call? ---No Guidelines Guideline Title Affirmed Question Affirmed Notes Nurse Date/Time (Eastern Time) Hives [1] Hives from food reaction AND [2] diagnosis never confirmed by a doctor (or NP/PA) [name] Sa, RN, [name] [name] 05/08/2021 7:05:16 AM Disp. Time [name] [name] Time) Disposition Final User 05/08/2021 7:10:12 AM See PCP within 24 Hours Yes [place], RN, [name] [name] PLEASE NOTE: All timestamps contained within this report are represented as [place] Standard Time. CONFIDENTIALTY NOTICE: This fax transmission is intended only for the addressee. It contains information that is legally privileged, confidential or otherwise protected from use or disclosure. If you are not the intended recipient, you are strictly prohibited from reviewing, disclosing, copying using or disseminating any of this information or taking any action in reliance on or regarding this information. If you have received this fax in error, please notify us immediately by telephone so that we can arrange for its return to [place]. Phone: [phone], Toll-Free: [phone], Fax: [phone] Page: 2 of 2 Call Id: [id number] Disposition Overriden: SEE PCP WITHIN 3 DAYS Override Reason: Patient's symptoms need a higher level of care Caller Disagree/Comply Comply Caller Understands Yes PreDisposition Call Doctor Care Advice Given Per Guideline SEE PCP WITHIN 3 DAYS: SEE PCP WITHIN 24 HOURS: * IF OFFICE WILL BE OPEN: You need to be examined within the next 24 hours. Call your doctor (or NP/PA) when the office opens and make an appointment. PREVENTION - REMOVE ALLERGENS: * Take a bath or shower if  triggered by pollens or animal contact. * Change clothes. COOL BATH FOR ITCHING: *  Take a cool bath for 10 minutes to relieve itching. (Caution: avoid any chill). * Rub very itchy areas with an ice cube for 10 minutes. * LORATADINE (ALAVERT, CLARITIN): The adult dose is 10 mg and you take it once a day. Loratadine is available in the [place] as Engineer, maintenance (IT); it is available in [place] as Claritin. * CETIRIZINE (REACTINE, ZYRTEC): The adult dose is 10 mg and you take it once a day. Cetirizine is available in the [place] as Zyrtec and in [place] as Reactine. ANTIHISTAMINE MEDICINES FOR WIDESPREAD HIVES: * For widespread hives, take either cetirizine or loratadine. They can help reduce the rash, itching, and other allergic symptoms. * Diphenhydramine (Benadryl) is a FIRST GENERATION ANTIHISTAMINE medicine. It causes more sleepiness than the newer second generation antihistamine medicines. The adult dosage of Benadryl is 25 to 50 mg by mouth and you can take it up to 4 times a day. * CAUTION: Antihistamine medicines can cause sleepiness. Do not drink alcohol, drive, or operate dangerous machinery while taking this drug. It can also worsen dry eyes by decreasing natural tearing. CALL BACK IF: * You become worse CARE ADVICE given per Hives (Adult) guideline. Referrals REFERRED TO PCP OFFICE

## 2021-05-08 NOTE — Progress Notes (Signed)
Subjective:    Patient ID: [name] [name], female    DOB: Sep 27, 1932, 87 y.o.   MRN: [id number]  HPI  [name] [name] is a very pleasant 87 y.o. female with a history of CKD, hypertension, multiple drug allergies, chronic fatigue, recurrent UTI who presents today to discuss hives.  She woke up this morning noticing hives and whelps to her upper and lower extremities which is itchy.   No new lotions, detergents, soaps or shampoos. No new vitamins, supplements. No new pets. No recent outdoor exposure or poison ivy exposure. No bonfire or smoke exposure.  No recent motel or hotel stay or new beds.   No fevers/chills, oral lesions, new joint pains, tick bites, abdominal pain, nausea.   The only change recently was a course of amoxicillin antibiotics that was prescribed on 04/30/21 for acute cystitis at [facility] ED. She finished her course of amoxicillin three days ago. She has numerous drug allergies, one being tocephalexin which causes a rash.   She denies wheezing, shortness of breath, chest tightness. She's taken Benadryl with some improvement in itching but no improvement in rash.    Review of Systems  HENT:  Negative for trouble swallowing.   Respiratory:  Negative for chest tightness, shortness of breath and wheezing.   Skin:  Positive for rash.        Past Medical History:  Diagnosis Date   Acute bilateral thoracic back pain 08/16/2019   Allergy    Cipro, Plavix, Statins   CAD (coronary artery disease)    a. s/p tandem Promus DES to LAD in 2009 by Dr. Olevia Perches;  b.  LHC (03/22/14):  prox LAD 30%, mid LAD 40%, LAD stent ok with dist 20% ISR, apical LAD occluded with L-L collats filling apical vessel (too small for PCI), mid RCA 20%, EF 70%.  Med Rx   Diabetes mellitus    Diagnosed 2012   Diverticulosis of colon (without mention of hemorrhage)    GERD (gastroesophageal reflux disease)    Herpes zoster without complication 07/22/2352   Hx of cardiovascular stress test    a. Nuclear  (08/2013):  No ischemia, EF 80%, Normal   Hx of echocardiogram    a. Echo (07/2013):  Mild LVH, vigorous LVF, EF 65-70%, Gr 1 DD, mild MR, mild to mod LAE   Hyperlipidemia    intol of statins   Hypertension    NHL (non-Hodgkin's lymphoma) ([place]) dx'd 2003   chemo/xrt comp 2003   Personal history of colonic polyps    Proctitis    Pruritic intertrigo 07/18/2014   Urticaria     Social History   Socioeconomic History   Marital status: Widowed    Spouse name: Not on file   Number of children: Not on file   Years of education: Not on file   Highest education level: Not on file  Occupational History   Occupation: Retired    Fish farm manager: RETIRED  Tobacco Use   Smoking status: Never   Smokeless tobacco: Never  Vaping Use   Vaping Use: Never used  Substance and Sexual Activity   Alcohol use: No   Drug use: No   Sexual activity: Not Currently  Other Topics Concern   Not on file  Social History Narrative   Not on file   Social Determinants of Health   Financial Resource Strain: Low Risk    Difficulty of Paying Living Expenses: Not hard at all  Food Insecurity: No Food Insecurity   Worried About Running Out  of Food in the Last Year: Never true   [place] in the Last Year: Never true  Transportation Needs: No Transportation Needs   Lack of Transportation (Medical): No   Lack of Transportation (Non-Medical): No  Physical Activity: Insufficiently Active   Days of Exercise per Week: 7 days   Minutes of Exercise per Session: 20 min  Stress: No Stress Concern Present   Feeling of Stress : Not at all  Social Connections: Not on file  Intimate Partner Violence: Not At Risk   Fear of Current or Ex-Partner: No   Emotionally Abused: No   Physically Abused: No   Sexually Abused: No    Past Surgical History:  Procedure Laterality Date   cardiac cath-neg     CORONARY ANGIOPLASTY WITH STENT PLACEMENT     x 2   dexa-neg     KNEE SURGERY  10/2003   left   laser surgery for  cataracts-left     LEFT HEART CATHETERIZATION WITH CORONARY ANGIOGRAM N/A 03/22/2014   Procedure: LEFT HEART CATHETERIZATION WITH CORONARY ANGIOGRAM;  Surgeon: Burnell Blanks, MD;  Location: [facility] CATH LAB;  Service: Cardiovascular;  Laterality: N/A;   stress cardiolite     VAGINAL HYSTERECTOMY     partial, fibroids, one ovary left    Family History  Problem Relation Age of Onset   Cancer Mother        jaw   Hypertension Father    Heart attack Father    Heart attack Sister    Colon cancer Neg Hx     Allergies  Allergen Reactions   Other Rash and Anaphylaxis   Shellfish Allergy Anaphylaxis and Rash   Cephalexin     REACTION: trash and swelling REACTION: trash and swelling   Ciprofloxacin     REACTION: u/k   Clopidogrel Bisulfate     REACTION: swelling, rash   Codeine Phosphate     REACTION: unspecified   Ezetimibe-Simvastatin     REACTION: myalgias REACTION: myalgias   Hydrocod Polst-Cpm Polst Er     REACTION: u/k   Lidocaine Hives    ONLY TO ADHESIVE LIDOCAINE PATCHES. NO PROBLEM WITH INJECTABLE LIDOCAINE.   Nitrofurantoin     REACTION: [place] REACTION: [place]   Pregabalin     REACTION: felt bad REACTION: felt bad   Propoxyphene N-Acetaminophen     REACTION: [place]   Rofecoxib     unk unk    Sulfamethoxazole     REACTION: unspecified   Sulfonamide Derivatives     REACTION: unspecified   Celecoxib Rash    REACTION: [place] REACTION: [place]   Codeine Rash    REACTION: [place] REACTION: [place]   Hydrocod Polst-Cpm Polst Er Rash   Propoxyphene Rash   Sulfa Antibiotics Rash    REACTION: unspecified REACTION: unspecified    Current Outpatient Medications on File Prior to Visit  Medication Sig Dispense Refill   aspirin 81 MG tablet Take 81 mg by mouth daily.     Blood Glucose Calibration (TRUE METRIX LEVEL 1) Low SOLN Use to calibrate meter 3 each 0   Blood Glucose Monitoring Suppl (TRUE METRIX METER) DEVI Test blood sugars once or twice daily for diabetes. 1 each 0    brimonidine-timolol (COMBIGAN) 0.2-0.5 % ophthalmic solution Place 1 drop into both eyes every 12 (twelve) hours.     Cholecalciferol (VITAMIN D-3) 1000 UNITS CAPS Take 1 capsule by mouth daily.     diphenhydrAMINE (BENADRYL) 25 MG tablet Take 25 mg by mouth  every 6 (six) hours as needed for itching or allergies.     ezetimibe (ZETIA) 10 MG tablet Take 1 tablet (10 mg total) by mouth daily. 90 tablet 1   fexofenadine (ALLEGRA) 180 MG tablet Take 1 tablet (180 mg total) by mouth as needed for allergies or rhinitis. 90 tablet 0   furosemide (LASIX) 20 MG tablet TAKE ONE TABLET BY MOUTH DAILY FOR LEG SWELLING 90 tablet 0   gabapentin (NEURONTIN) 100 MG capsule Take 1 capsule (100 mg total) by mouth 3 (three) times daily. 270 capsule 1   glipiZIDE (GLUCOTROL XL) 10 MG 24 hr tablet TAKE ONE TABLET BY MOUTH EVERY MORNING WITH BREAKFAST for diabetes. 90 tablet 0   glucose blood (RELION TRUE METRIX TEST STRIPS) test strip Test blood sugars once to twice daily for diabetes. 100 each 5   isosorbide mononitrate (IMDUR) 60 MG 24 hr tablet Take 1 tablet (60 mg total) by mouth daily. 90 tablet 1   metoprolol succinate (TOPROL-XL) 100 MG 24 hr tablet TAKE ONE TABLET BY MOUTH EVERY MORNING WITH OR IMMEDIATELY FOLLOWING A MEAL 90 tablet 1   nitroGLYCERIN (NITROSTAT) 0.4 MG SL tablet Place 1 tablet (0.4 mg total) under the tongue every 5 (five) minutes as needed for chest pain. 26 tablet 0   OVER THE COUNTER MEDICATION Take 1 tablet by mouth daily. Med name: VISION FORMULA     pantoprazole (PROTONIX) 40 MG tablet TAKE ONE TABLET BY MOUTH DAILY FOR HEARTBURN 90 tablet 0   ReliOn Lancets Micro-Thin 33G MISC Test blood sugars once to twice daily for diabetes. 100 each 5   sertraline (ZOLOFT) 50 MG tablet Take 1 tablet (50 mg total) by mouth daily. For anxiety. 90 tablet 3   timolol (TIMOPTIC) 0.5 % ophthalmic solution      vitamin B-12 (CYANOCOBALAMIN) 1000 MCG tablet Take 1,000 mcg by mouth daily.     trimethoprim  (TRIMPEX) 100 MG tablet Take 100 mg by mouth 2 (two) times daily as needed (UTI).      [DISCONTINUED] loratadine (CLARITIN) 10 MG tablet Take 10 mg by mouth as needed.      No current facility-administered medications on file prior to visit.    BP 128/70   Pulse (!) 59   Temp 97.7 F (36.5 C) (Temporal)   Ht 5\' 8"  (1.727 m)   Wt 167 lb 12 oz (76.1 kg)   SpO2 97%   BMI 25.51 kg/m  Objective:   Physical Exam Cardiovascular:     Rate and Rhythm: Normal rate and regular rhythm.  Pulmonary:     Effort: Pulmonary effort is normal.     Breath sounds: Normal breath sounds.  Skin:    General: Skin is warm and dry.     Findings: Rash present.     Comments: Small and large raised, red, hives to upper and lower extremities, anterior chest, abdomen, right ear.   Neurological:     Mental Status: She is alert.          Assessment & Plan:      This visit occurred during the SARS-CoV-2 public health emergency.  Safety protocols were in place, including screening questions prior to the visit, additional usage of staff PPE, and extensive cleaning of exam room while observing appropriate contact time as indicated for disinfecting solutions.

## 2021-05-10 ENCOUNTER — Other Ambulatory Visit: Payer: Self-pay | Admitting: Cardiovascular Disease

## 2021-05-11 DIAGNOSIS — R928 Other abnormal and inconclusive findings on diagnostic imaging of breast: Secondary | ICD-10-CM | POA: Diagnosis not present

## 2021-05-11 DIAGNOSIS — R922 Inconclusive mammogram: Secondary | ICD-10-CM | POA: Diagnosis not present

## 2021-05-12 ENCOUNTER — Telehealth: Payer: Self-pay | Admitting: Primary Care

## 2021-05-12 DIAGNOSIS — L509 Urticaria, unspecified: Secondary | ICD-10-CM

## 2021-05-12 NOTE — Telephone Encounter (Signed)
Mrs. Arcilla called in wanted to know if she can get some help with her itching and whelps and stings. She gets them really bad in the morning and then it goes down after she takes the Benadryl and Prednisone.  And she stated that a long time ago she was taking a mediation Hydroxyzine 10mg  hcl.        Please advise

## 2021-05-12 NOTE — Telephone Encounter (Signed)
Has she noticed any improvement in itching and whelps since I last saw her?  During our visit I told her to get some Zyrtec/Claritin/Allegra to take daily... did she do this?  If so then I want her to add in famotidine 20 mg (1 or 2 tablets) once daily (generic Pepcid) for itching and whelps. I can send in Rx, but need to know above answers first.

## 2021-05-12 NOTE — Telephone Encounter (Signed)
Left message to return call to our office.  

## 2021-05-13 NOTE — Telephone Encounter (Signed)
Patient is returning call from [place].

## 2021-05-14 NOTE — Telephone Encounter (Signed)
Noted  

## 2021-05-14 NOTE — Telephone Encounter (Signed)
Called patient she has started having some imprvement that started yesterday. She is taking allegra 180. She did not want to start on Pepcid for now. Will call if she changes her mind.

## 2021-05-21 DIAGNOSIS — H401133 Primary open-angle glaucoma, bilateral, severe stage: Secondary | ICD-10-CM | POA: Diagnosis not present

## 2021-05-21 DIAGNOSIS — Z83511 Family history of glaucoma: Secondary | ICD-10-CM | POA: Diagnosis not present

## 2021-05-22 MED ORDER — FAMOTIDINE 20 MG PO TABS: 20.0000 mg | ORAL_TABLET | Freq: Every day | ORAL | 0 refills | Status: DC

## 2021-05-22 NOTE — Addendum Note (Signed)
Addended by: Pleas Koch on: 05/22/2021 01:21 PM   Modules accepted: Orders

## 2021-05-22 NOTE — Telephone Encounter (Signed)
[name] [name] called in and wanted to know about the pepcid she said she wanted to start it. And she wanted it to be [facility] to [facility] [address]

## 2021-05-22 NOTE — Telephone Encounter (Signed)
Is she improving at all from her hives? Is her rash still there?  I will send the Pepcid 20 mg to her pharmacy, she should take this along with an antihistamine such as Zyrtec/Allegra/Claritin.

## 2021-05-26 NOTE — Telephone Encounter (Signed)
Pt called back, asked her how her hives and rash are? Pt said it is all gone and hopes it stays gone. Told her okay good and did you get the Rx for Pepcid? Pt said yes. Told her okay glad you are better and reviewed up coming appt dates. Pt verbalized understanding.

## 2021-05-26 NOTE — Telephone Encounter (Signed)
Left detailed message on mobile number. Calling to follow up to see if your hives are improving and to make sure you picked up the Pepcid that Allie Bossier sent to the pharmacy for you. Please call back and let us know how you are doing.

## 2021-05-29 ENCOUNTER — Other Ambulatory Visit: Payer: Self-pay | Admitting: Primary Care

## 2021-05-29 DIAGNOSIS — E1100 Type 2 diabetes mellitus with hyperosmolarity without nonketotic hyperglycemic-hyperosmolar coma (NKHHC): Secondary | ICD-10-CM

## 2021-06-04 ENCOUNTER — Telehealth: Payer: Self-pay

## 2021-06-04 NOTE — Telephone Encounter (Signed)
Noted and agree. Appreciate Dr. Virgil Benedict assessment.

## 2021-06-04 NOTE — Telephone Encounter (Signed)
Patient is calling in requesting to speak with [name] [name] or [name] [name]. Asked what was going on, [name] said she is having a bad coughing fit for two weeks, and now has gone to a stuffy/runny nose. Offered an appointment but declined stating she just wants [name] [name] to send a prescription in or talk more with [name] [name] about her symptoms.

## 2021-06-04 NOTE — Telephone Encounter (Signed)
Called spoke to patient has had cough x 2 weeks with facial pain and congestion. Denies any fever. States home covid test today was negative. Waned antibiotic called in. Pt informed that we would need to set up virtual to have evaluation before anyone could call something in. I have made appointment with Dr. Birdie Riddle in the morning. Red words reviewed if any or increased symptoms will go to ED or urgent care for evaluation.

## 2021-06-05 ENCOUNTER — Other Ambulatory Visit: Payer: Self-pay

## 2021-06-05 ENCOUNTER — Telehealth (INDEPENDENT_AMBULATORY_CARE_PROVIDER_SITE_OTHER): Payer: Medicare PPO | Admitting: Family Medicine

## 2021-06-05 ENCOUNTER — Encounter: Payer: Self-pay | Admitting: Family Medicine

## 2021-06-05 DIAGNOSIS — B9689 Other specified bacterial agents as the cause of diseases classified elsewhere: Secondary | ICD-10-CM | POA: Diagnosis not present

## 2021-06-05 DIAGNOSIS — J329 Chronic sinusitis, unspecified: Secondary | ICD-10-CM

## 2021-06-05 MED ORDER — DOXYCYCLINE HYCLATE 100 MG PO TABS: 100.0000 mg | ORAL_TABLET | Freq: Two times a day (BID) | ORAL | 0 refills | Status: DC

## 2021-06-05 NOTE — Progress Notes (Signed)
Virtual Visit via Video   I connected with patient on 06/05/21 at  8:30 AM EDT by a video enabled telemedicine application and verified that I am speaking with the correct person using two identifiers.  Location patient: Home Location provider: [facility] Persons participating in the virtual visit: Patient, Provider, [place] (Sabrina M)  I discussed the limitations of evaluation and management by telemedicine and the availability of in person appointments. The patient expressed understanding and agreed to proceed.  Subjective:   HPI:   Cough- 'i think it started out as a cold'.  Now hoarse, 'when I cough I can't spit it up.  It's like glue'.  Taking Delsym and Tylenol.  Nasal congestion is 'a horrible green color'.  Sxs started ~2 weeks ago.  No fevers.  'i just don't feel good.  I'm weak feeling'.  No known sick contacts.  + maxillary sinus pain.  'it's all in my face and throat and chest'.  Pt reports this feels similar to previous sinus infxns  ROS:   See pertinent positives and negatives per HPI.  Patient Active Problem List   Diagnosis Date Noted   Left lower quadrant abdominal pain 01/21/2021   Skin mass 09/11/2020   Bilateral lower extremity edema 03/26/2020   Preventative health care 06/05/2019   Shoulder pain 03/13/2019   Tingling 06/29/2018   Recurrent UTI 01/12/2018   Neck pain 09/27/2017   Insomnia 09/27/2017   Stress due to illness of family member 05/04/2016   Hives 05/04/2016   Pain in joints of both feet 01/14/2016   Osteopenia 08/06/2015   Constipation 12/30/2014   B12 deficiency 12/30/2014   Allergic reaction 09/02/2014   Arthralgia of right foot 07/18/2014   Right hip pain 06/04/2014   Chronic fatigue 06/04/2014   Elevated LFTs 04/19/2014   Mitral regurgitation 04/19/2014   Pes planus 06/13/2013   Renal insufficiency 06/07/2011   Type 2 diabetes mellitus ([place]) 05/31/2011   MIXED HYPERLIPIDEMIA 08/04/2010   LUMBAR SPRAIN AND STRAIN  01/15/2010   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 02/25/2009   EXTERNAL HEMORRHOIDS 02/24/2009   DIVERTICULOSIS, COLON 02/24/2009   LYMPHOMA, MALT 08/28/2007   DETACHED RETINA 08/28/2007   Essential hypertension 08/28/2007   Coronary atherosclerosis 08/28/2007   MITRAL VALVE PROLAPSE 08/28/2007   GERD 08/28/2007   IBS 08/28/2007   BREAST CYSTS, BILATERAL 08/28/2007    Social History   Tobacco Use   Smoking status: Never   Smokeless tobacco: Never  Substance Use Topics   Alcohol use: No    Current Outpatient Medications:    aspirin 81 MG tablet, Take 81 mg by mouth daily., Disp: , Rfl:    Blood Glucose Calibration (TRUE METRIX LEVEL 1) Low SOLN, Use to calibrate meter, Disp: 3 each, Rfl: 0   Blood Glucose Monitoring Suppl (TRUE METRIX METER) DEVI, Test blood sugars once or twice daily for diabetes., Disp: 1 each, Rfl: 0   brimonidine-timolol (COMBIGAN) 0.2-0.5 % ophthalmic solution, Place 1 drop into both eyes every 12 (twelve) hours., Disp: , Rfl:    Cholecalciferol (VITAMIN D-3) 1000 UNITS CAPS, Take 1 capsule by mouth daily., Disp: , Rfl:    diphenhydrAMINE (BENADRYL) 25 MG tablet, Take 25 mg by mouth every 6 (six) hours as needed for itching or allergies., Disp: , Rfl:    ezetimibe (ZETIA) 10 MG tablet, Take 1 tablet (10 mg total) by mouth daily. Please call and schedule a one year follow up appointment for further refills 1st attempt, Disp: 90  tablet, Rfl: 0   famotidine (PEPCID) 20 MG tablet, Take 1 tablet (20 mg total) by mouth daily. For itching. Take with antihistamine., Disp: 90 tablet, Rfl: 0   fexofenadine (ALLEGRA) 180 MG tablet, Take 1 tablet (180 mg total) by mouth as needed for allergies or rhinitis., Disp: 90 tablet, Rfl: 0   gabapentin (NEURONTIN) 100 MG capsule, Take 1 capsule (100 mg total) by mouth 3 (three) times daily., Disp: 270 capsule, Rfl: 1   glipiZIDE (GLUCOTROL XL) 10 MG 24 hr tablet, TAKE ONE TABLET BY MOUTH EVERY MORNING WITH BREAKFAST for  diabetes., Disp: 90 tablet, Rfl: 0   glucose blood (TRUE METRIX BLOOD GLUCOSE TEST) test strip, TEST BLOOD SUGAR ONCE TO TWICE DAILY FOR DIABETES., Disp: 200 strip, Rfl: 1   isosorbide mononitrate (IMDUR) 60 MG 24 hr tablet, Take 1 tablet (60 mg total) by mouth daily. Please call and schedule a one year follow up appointment for further refills 1st attempt, Disp: 90 tablet, Rfl: 0   metoprolol succinate (TOPROL-XL) 100 MG 24 hr tablet, TAKE ONE TABLET BY MOUTH EVERY MORNING WITH OR IMMEDIATELY FOLLOWING A MEAL Please call and schedule a one year follow up appointment for further refills 1st attempt, Disp: 90 tablet, Rfl: 0   nitroGLYCERIN (NITROSTAT) 0.4 MG SL tablet, Place 1 tablet (0.4 mg total) under the tongue every 5 (five) minutes as needed for chest pain., Disp: 26 tablet, Rfl: 0   pantoprazole (PROTONIX) 40 MG tablet, TAKE ONE TABLET BY MOUTH DAILY FOR HEARTBURN, Disp: 90 tablet, Rfl: 0   predniSONE (DELTASONE) 20 MG tablet, Take 2 tablets by mouth once daily for 5 days., Disp: 10 tablet, Rfl: 0   ReliOn Lancets Micro-Thin 33G MISC, Test blood sugars once to twice daily for diabetes., Disp: 100 each, Rfl: 5   sertraline (ZOLOFT) 50 MG tablet, Take 1 tablet (50 mg total) by mouth daily. For anxiety., Disp: 90 tablet, Rfl: 3   timolol (TIMOPTIC) 0.5 % ophthalmic solution, , Disp: , Rfl:    vitamin B-12 (CYANOCOBALAMIN) 1000 MCG tablet, Take 1,000 mcg by mouth daily., Disp: , Rfl:    furosemide (LASIX) 20 MG tablet, TAKE ONE TABLET BY MOUTH DAILY FOR LEG SWELLING (Patient not taking: Reported on 06/05/2021), Disp: 90 tablet, Rfl: 0   OVER THE COUNTER MEDICATION, Take 1 tablet by mouth daily. Med name: VISION FORMULA (Patient not taking: Reported on 06/05/2021), Disp: , Rfl:    trimethoprim (TRIMPEX) 100 MG tablet, Take 100 mg by mouth 2 (two) times daily as needed (UTI). , Disp: , Rfl:   Allergies  Allergen Reactions   Amoxicillin Hives   Other Rash and Anaphylaxis   Shellfish Allergy Anaphylaxis  and Rash   Cephalexin     REACTION: trash and swelling REACTION: trash and swelling   Ciprofloxacin     REACTION: u/k   Clopidogrel Bisulfate     REACTION: swelling, rash   Codeine Phosphate     REACTION: unspecified   Ezetimibe-Simvastatin     REACTION: myalgias REACTION: myalgias   Hydrocod Polst-Cpm Polst Er     REACTION: u/k   Lidocaine Hives    ONLY TO ADHESIVE LIDOCAINE PATCHES. NO PROBLEM WITH INJECTABLE LIDOCAINE.   Nitrofurantoin     REACTION: [place] REACTION: [place]   Pregabalin     REACTION: felt bad REACTION: felt bad   Propoxyphene N-Acetaminophen     REACTION: [place]   Rofecoxib     unk unk    Sulfamethoxazole     REACTION: unspecified  Sulfonamide Derivatives     REACTION: unspecified   Celecoxib Rash    REACTION: [place] REACTION: [place]   Codeine Rash    REACTION: [place] REACTION: [place]   Hydrocod Polst-Cpm Polst Er Rash   Propoxyphene Rash   Sulfa Antibiotics Rash    REACTION: unspecified REACTION: unspecified    Objective:   There were no vitals taken for this visit.  AAOx3, NAD NCAT, EOMI No obvious CN deficits Coloring WNL Pt is able to speak clearly, coherently without shortness of breath or increased work of breathing.  Thought process is linear.  Mood is appropriate.   Assessment and Plan:   Bacterial sinusitis- new.  Given that pt has had sxs for ~2 weeks and she has now developed maxillary sinus pain/pressure in addition to her nasal congestion and cough, this is concerning for bacterial infxn.  Reviewed her many allergies and it appears that Doxycycline is safe to give and that she has done ok w/ it in the past.  Encouraged OTC Delsym and Mucinex to thin congestion and tx cough.  Reviewed supportive care and red flags that should prompt return. Pt expressed understanding and is in agreement w/ plan.    [name] Asa, MD 06/05/2021

## 2021-06-05 NOTE — Progress Notes (Signed)
I connected with  [name] [name] on 06/05/21 by a video enabled telemedicine application and verified that I am speaking with the correct person using two identifiers.   I discussed the limitations of evaluation and management by telemedicine. The patient expressed understanding and agreed to proceed.

## 2021-06-09 ENCOUNTER — Other Ambulatory Visit: Payer: Self-pay | Admitting: Primary Care

## 2021-06-09 DIAGNOSIS — E119 Type 2 diabetes mellitus without complications: Secondary | ICD-10-CM

## 2021-06-09 DIAGNOSIS — E538 Deficiency of other specified B group vitamins: Secondary | ICD-10-CM

## 2021-06-09 DIAGNOSIS — E782 Mixed hyperlipidemia: Secondary | ICD-10-CM

## 2021-06-19 ENCOUNTER — Ambulatory Visit: Payer: Medicare PPO | Admitting: Podiatry

## 2021-06-19 ENCOUNTER — Other Ambulatory Visit: Payer: Self-pay

## 2021-06-19 DIAGNOSIS — M7742 Metatarsalgia, left foot: Secondary | ICD-10-CM | POA: Diagnosis not present

## 2021-06-19 DIAGNOSIS — M7741 Metatarsalgia, right foot: Secondary | ICD-10-CM

## 2021-06-19 DIAGNOSIS — E0843 Diabetes mellitus due to underlying condition with diabetic autonomic (poly)neuropathy: Secondary | ICD-10-CM | POA: Diagnosis not present

## 2021-06-19 DIAGNOSIS — M7751 Other enthesopathy of right foot: Secondary | ICD-10-CM | POA: Diagnosis not present

## 2021-06-19 MED ORDER — MELOXICAM 15 MG PO TABS
15.0000 mg | ORAL_TABLET | Freq: Every day | ORAL | 1 refills | Status: DC
Start: 2021-06-19 — End: 2021-08-17

## 2021-06-22 DIAGNOSIS — M7751 Other enthesopathy of right foot: Secondary | ICD-10-CM | POA: Diagnosis not present

## 2021-06-22 DIAGNOSIS — E0843 Diabetes mellitus due to underlying condition with diabetic autonomic (poly)neuropathy: Secondary | ICD-10-CM | POA: Diagnosis not present

## 2021-06-22 DIAGNOSIS — M7742 Metatarsalgia, left foot: Secondary | ICD-10-CM | POA: Diagnosis not present

## 2021-06-22 DIAGNOSIS — M7741 Metatarsalgia, right foot: Secondary | ICD-10-CM | POA: Diagnosis not present

## 2021-06-22 MED ORDER — BETAMETHASONE SOD PHOS & ACET 6 (3-3) MG/ML IJ SUSP
3.0000 mg | Freq: Once | INTRAMUSCULAR | Status: AC
  Administered 2021-06-22: 3 mg via INTRA_ARTICULAR

## 2021-06-22 NOTE — Progress Notes (Signed)
   HPI: 87 y.o. female presenting today for follow-up evaluation of bilateral foot pain right greater than the left.  Patient's last seen in the office on 04/17/2021.  Patient states that she is doing somewhat better and the injection helped temporarily.  She presents for further treatment and evaluation  Past Medical History:  Diagnosis Date   Acute bilateral thoracic back pain 08/16/2019   Allergy    Cipro, Plavix, Statins   CAD (coronary artery disease)    a. s/p tandem Promus DES to LAD in 2009 by Dr. Olevia Perches;  b.  LHC (03/22/14):  prox LAD 30%, mid LAD 40%, LAD stent ok with dist 20% ISR, apical LAD occluded with L-L collats filling apical vessel (too small for PCI), mid RCA 20%, EF 70%.  Med Rx   Diabetes mellitus    Diagnosed 2012   Diverticulosis of colon (without mention of hemorrhage)    GERD (gastroesophageal reflux disease)    Herpes zoster without complication [id number]   Hx of cardiovascular stress test    a. Nuclear (08/2013):  No ischemia, EF 80%, Normal   Hx of echocardiogram    a. Echo (07/2013):  Mild LVH, vigorous LVF, EF 65-70%, Gr 1 DD, mild MR, mild to mod LAE   Hyperlipidemia    intol of statins   Hypertension    NHL (non-Hodgkin's lymphoma) ([place]) dx'd 2003   chemo/xrt comp 2003   Personal history of colonic polyps    Proctitis    Pruritic intertrigo 07/18/2014   Urticaria      Physical Exam: General: The patient is alert and oriented x3 in no acute distress.  Dermatology: Skin is warm, dry and supple bilateral lower extremities. Negative for open lesions or macerations.  Vascular: Palpable pedal pulses bilaterally. No edema or erythema noted. Capillary refill within normal limits.  Neurological: Epicritic and protective threshold diminished bilaterally.   Musculoskeletal Exam: Pain with palpation noted to the metatarsal heads of the bilateral feet.  Today there is also pain on palpation range of motion the first MTPJ of the right foot.  Range of motion within  normal limits to all pedal and ankle joints bilateral. Muscle strength 5/5 in all groups bilateral.     Assessment: 1. Metatarsalgia bilateral  2. Peripheral polyneuropathy bilaterally secondary to diabetes mellitus  3.  Metatarsophalangeal capsulitis first MTPJ right foot   Plan of Care:  1. Patient evaluated.  2.  Injection of 0.5 cc Celestone Soluspan injection the first MTPJ right foot.  3.  Continue gabapentin 100 mg TID provided to patient.  4.  Diabetic shoes and insoles are pending 5. Return to clinic as needed or for diabetic shoe and insole pickup.        Edrick Kins, DPM [facility]  Dr. Edrick Kins, DPM    [address], [place] [address]                Office [phone]  Fax [phone]

## 2021-06-23 ENCOUNTER — Other Ambulatory Visit: Payer: Medicare PPO

## 2021-06-23 ENCOUNTER — Ambulatory Visit (INDEPENDENT_AMBULATORY_CARE_PROVIDER_SITE_OTHER): Payer: Medicare PPO

## 2021-06-23 DIAGNOSIS — Z0000 Encounter for general adult medical examination without abnormal findings: Secondary | ICD-10-CM

## 2021-06-23 NOTE — Progress Notes (Signed)
PCP notes:  Health Maintenance: Shingrix- due Foot exam- due    Abnormal Screenings: none   Patient concerns: Urinary incontinence   Nurse concerns: none   Next PCP appt.: 07/02/2021 @ 10:40 am

## 2021-06-23 NOTE — Patient Instructions (Signed)
[name] , Thank you for taking time to come for your Medicare Wellness Visit. I appreciate your ongoing commitment to your health goals. Please review the following plan we discussed and let me know if I can assist you in the future.   Screening recommendations/referrals: Colonoscopy: no longer required  Mammogram: Up to date, completed 05/11/2021, due 04/2022 Bone Density: Up to date, completed 07/15/2020, due 06/2022 Recommended yearly ophthalmology/optometry visit for glaucoma screening and checkup Recommended yearly dental visit for hygiene and checkup  Vaccinations: Influenza vaccine: Up to date, completed 08/29/2020, due 06/2021 Pneumococcal vaccine: Completed series Tdap vaccine: decline-insurance Shingles vaccine: due, check with your insurance regarding coverage if interested    Covid-19:completed 3 vaccines   Advanced directives: Advance directive discussed with you today. Even though you declined this today please call our office should you change your mind and we can give you the proper paperwork for you to fill out.  Conditions/risks identified: diabetes, hypertension, hyperlipidemia   Next appointment: Follow up in one year for your annual wellness visit    Preventive Care 85 Years and Older, Female Preventive care refers to lifestyle choices and visits with your health care provider that can promote health and wellness. What does preventive care include? A yearly physical exam. This is also called an annual well check. Dental exams once or twice a year. Routine eye exams. Ask your health care provider how often you should have your eyes checked. Personal lifestyle choices, including: Daily care of your teeth and gums. Regular physical activity. Eating a healthy diet. Avoiding tobacco and drug use. Limiting alcohol use. Practicing safe sex. Taking low-dose aspirin every day. Taking vitamin and mineral supplements as recommended by your health care provider. What  happens during an annual well check? The services and screenings done by your health care provider during your annual well check will depend on your age, overall health, lifestyle risk factors, and family history of disease. Counseling  Your health care provider may ask you questions about your: Alcohol use. Tobacco use. Drug use. Emotional well-being. Home and relationship well-being. Sexual activity. Eating habits. History of falls. Memory and ability to understand (cognition). Work and work Statistician. Reproductive health. Screening  You may have the following tests or measurements: Height, weight, and BMI. Blood pressure. Lipid and cholesterol levels. These may be checked every 5 years, or more frequently if you are over 40 years old. Skin check. Lung cancer screening. You may have this screening every year starting at age 85 if you have a 30-pack-year history of smoking and currently smoke or have quit within the past 15 years. Fecal occult blood test (FOBT) of the stool. You may have this test every year starting at age 85. Flexible sigmoidoscopy or colonoscopy. You may have a sigmoidoscopy every 5 years or a colonoscopy every 10 years starting at age 85. Hepatitis C blood test. Hepatitis B blood test. Sexually transmitted disease (STD) testing. Diabetes screening. This is done by checking your blood sugar (glucose) after you have not eaten for a while (fasting). You may have this done every 1-3 years. Bone density scan. This is done to screen for osteoporosis. You may have this done starting at age 42. Mammogram. This may be done every 1-2 years. Talk to your health care provider about how often you should have regular mammograms. Talk with your health care provider about your test results, treatment options, and if necessary, the need for more tests. Vaccines  Your health care provider may recommend certain vaccines,  such as: Influenza vaccine. This is recommended every  year. Tetanus, diphtheria, and acellular pertussis (Tdap, Td) vaccine. You may need a Td booster every 10 years. Zoster vaccine. You may need this after age 17. Pneumococcal 13-valent conjugate (PCV13) vaccine. One dose is recommended after age 11. Pneumococcal polysaccharide (PPSV23) vaccine. One dose is recommended after age 107. Talk to your health care provider about which screenings and vaccines you need and how often you need them. This information is not intended to replace advice given to you by your health care provider. Make sure you discuss any questions you have with your health care provider. Document Released: 12/12/2015 Document Revised: 08/04/2016 Document Reviewed: 09/16/2015 Elsevier Interactive Patient Education  2017 [employer] Prevention in the Home Falls can cause injuries. They can happen to people of all ages. There are many things you can do to make your home safe and to help prevent falls. What can I do on the outside of my home? Regularly fix the edges of walkways and driveways and fix any cracks. Remove anything that might make you trip as you walk through a door, such as a raised step or threshold. Trim any bushes or trees on the path to your home. Use bright outdoor lighting. Clear any walking paths of anything that might make someone trip, such as rocks or tools. Regularly check to see if handrails are loose or broken. Make sure that both sides of any steps have handrails. Any raised decks and porches should have guardrails on the edges. Have any leaves, snow, or ice cleared regularly. Use sand or salt on walking paths during winter. Clean up any spills in your garage right away. This includes oil or grease spills. What can I do in the bathroom? Use night lights. Install grab bars by the toilet and in the tub and shower. Do not use towel bars as grab bars. Use non-skid mats or decals in the tub or shower. If you need to sit down in the shower, use a  plastic, non-slip stool. Keep the floor dry. Clean up any water that spills on the floor as soon as it happens. Remove soap buildup in the tub or shower regularly. Attach bath mats securely with double-sided non-slip rug tape. Do not have throw rugs and other things on the floor that can make you trip. What can I do in the bedroom? Use night lights. Make sure that you have a light by your bed that is easy to reach. Do not use any sheets or blankets that are too big for your bed. They should not hang down onto the floor. Have a firm chair that has side arms. You can use this for support while you get dressed. Do not have throw rugs and other things on the floor that can make you trip. What can I do in the kitchen? Clean up any spills right away. Avoid walking on wet floors. Keep items that you use a lot in easy-to-reach places. If you need to reach something above you, use a strong step stool that has a grab bar. Keep electrical cords out of the way. Do not use floor polish or wax that makes floors slippery. If you must use wax, use non-skid floor wax. Do not have throw rugs and other things on the floor that can make you trip. What can I do with my stairs? Do not leave any items on the stairs. Make sure that there are handrails on both sides of the stairs and  use them. Fix handrails that are broken or loose. Make sure that handrails are as long as the stairways. Check any carpeting to make sure that it is firmly attached to the stairs. Fix any carpet that is loose or worn. Avoid having throw rugs at the top or bottom of the stairs. If you do have throw rugs, attach them to the floor with carpet tape. Make sure that you have a light switch at the top of the stairs and the bottom of the stairs. If you do not have them, ask someone to add them for you. What else can I do to help prevent falls? Wear shoes that: Do not have high heels. Have rubber bottoms. Are comfortable and fit you  well. Are closed at the toe. Do not wear sandals. If you use a stepladder: Make sure that it is fully opened. Do not climb a closed stepladder. Make sure that both sides of the stepladder are locked into place. Ask someone to hold it for you, if possible. Clearly mark and make sure that you can see: Any grab bars or handrails. First and last steps. Where the edge of each step is. Use tools that help you move around (mobility aids) if they are needed. These include: Canes. Walkers. Scooters. Crutches. Turn on the lights when you go into a dark area. Replace any light bulbs as soon as they burn out. Set up your furniture so you have a clear path. Avoid moving your furniture around. If any of your floors are uneven, fix them. If there are any pets around you, be aware of where they are. Review your medicines with your doctor. Some medicines can make you feel dizzy. This can increase your chance of falling. Ask your doctor what other things that you can do to help prevent falls. This information is not intended to replace advice given to you by your health care provider. Make sure you discuss any questions you have with your health care provider. Document Released: 09/11/2009 Document Revised: 04/22/2016 Document Reviewed: 12/20/2014 Elsevier Interactive Patient Education  2017 [employer].

## 2021-06-23 NOTE — Progress Notes (Signed)
Subjective:   [name] [name] is a 87 y.o. female who presents for Medicare Annual (Subsequent) preventive examination.  Review of Systems: N/A      I connected with the patient today by telephone and verified that I am speaking with the correct person using two identifiers. Location patient: home Location nurse: work Persons participating in the telephone visit: patient, nurse.   I discussed the limitations, risks, security and privacy concerns of performing an evaluation and management service by telephone and the availability of in person appointments. I also discussed with the patient that there may be a patient responsible charge related to this service. The patient expressed understanding and verbally consented to this telephonic visit.        Cardiac Risk Factors include: advanced age (>2mn, >>61women);diabetes mellitus;hypertension;Other (see comment), Risk factor comments: hyperlipidemia     Objective:    Today's Vitals   There is no height or weight on file to calculate BMI.  Advanced Directives 06/23/2021 04/30/2021 06/20/2020 04/27/2019 04/27/2017 03/22/2016 03/22/2014  Does Patient Have a Medical Advance Directive? No No No No No No Patient does not have advance directive;Patient would not like information  Would patient like information on creating a medical advance directive? No - Patient declined No - Patient declined No - Patient declined No - Patient declined - Yes - Educational materials given -    Current Medications (verified) Outpatient Encounter Medications as of 06/23/2021  Medication Sig   aspirin 81 MG tablet Take 81 mg by mouth daily.   Blood Glucose Calibration (TRUE METRIX LEVEL 1) Low SOLN Use to calibrate meter   Blood Glucose Monitoring Suppl (TRUE METRIX METER) DEVI Test blood sugars once or twice daily for diabetes.   brimonidine-timolol (COMBIGAN) 0.2-0.5 % ophthalmic solution Place 1 drop into both eyes every 12 (twelve) hours.   Cholecalciferol  (VITAMIN D-3) 1000 UNITS CAPS Take 1 capsule by mouth daily.   diphenhydrAMINE (BENADRYL) 25 MG tablet Take 25 mg by mouth every 6 (six) hours as needed for itching or allergies.   doxycycline (VIBRA-TABS) 100 MG tablet Take 1 tablet (100 mg total) by mouth 2 (two) times daily.   ezetimibe (ZETIA) 10 MG tablet Take 1 tablet (10 mg total) by mouth daily. Please call and schedule a one year follow up appointment for further refills 1st attempt   famotidine (PEPCID) 20 MG tablet Take 1 tablet (20 mg total) by mouth daily. For itching. Take with antihistamine.   fexofenadine (ALLEGRA) 180 MG tablet Take 1 tablet (180 mg total) by mouth as needed for allergies or rhinitis.   furosemide (LASIX) 20 MG tablet TAKE ONE TABLET BY MOUTH DAILY FOR LEG SWELLING   gabapentin (NEURONTIN) 100 MG capsule Take 1 capsule (100 mg total) by mouth 3 (three) times daily.   glipiZIDE (GLUCOTROL XL) 10 MG 24 hr tablet TAKE ONE TABLET BY MOUTH EVERY MORNING WITH BREAKFAST for diabetes.   glucose blood (TRUE METRIX BLOOD GLUCOSE TEST) test strip TEST BLOOD SUGAR ONCE TO TWICE DAILY FOR DIABETES.   isosorbide mononitrate (IMDUR) 60 MG 24 hr tablet Take 1 tablet (60 mg total) by mouth daily. Please call and schedule a one year follow up appointment for further refills 1st attempt   meloxicam (MOBIC) 15 MG tablet Take 1 tablet (15 mg total) by mouth daily.   metoprolol succinate (TOPROL-XL) 100 MG 24 hr tablet TAKE ONE TABLET BY MOUTH EVERY MORNING WITH OR IMMEDIATELY FOLLOWING A MEAL Please call and schedule a one year follow  up appointment for further refills 1st attempt   nitroGLYCERIN (NITROSTAT) 0.4 MG SL tablet Place 1 tablet (0.4 mg total) under the tongue every 5 (five) minutes as needed for chest pain.   OVER THE COUNTER MEDICATION Take 1 tablet by mouth daily. Med name: VISION FORMULA   pantoprazole (PROTONIX) 40 MG tablet TAKE ONE TABLET BY MOUTH DAILY FOR HEARTBURN   predniSONE (DELTASONE) 20 MG tablet Take 2 tablets  by mouth once daily for 5 days.   ReliOn Lancets Micro-Thin 33G MISC Test blood sugars once to twice daily for diabetes.   sertraline (ZOLOFT) 50 MG tablet Take 1 tablet (50 mg total) by mouth daily. For anxiety.   timolol (TIMOPTIC) 0.5 % ophthalmic solution    trimethoprim (TRIMPEX) 100 MG tablet Take 100 mg by mouth 2 (two) times daily as needed (UTI).    vitamin B-12 (CYANOCOBALAMIN) 1000 MCG tablet Take 1,000 mcg by mouth daily.   [DISCONTINUED] loratadine (CLARITIN) 10 MG tablet Take 10 mg by mouth as needed.    No facility-administered encounter medications on file as of 06/23/2021.    Allergies (verified) Amoxicillin, Other, Shellfish allergy, Cephalexin, Ciprofloxacin, Clopidogrel bisulfate, Codeine phosphate, Ezetimibe-simvastatin, Hydrocod polst-cpm polst er, Lidocaine, Nitrofurantoin, Pregabalin, Propoxyphene n-acetaminophen, Rofecoxib, Sulfamethoxazole, Sulfonamide derivatives, Celecoxib, Codeine, Hydrocod polst-cpm polst er, Propoxyphene, and Sulfa antibiotics   History: Past Medical History:  Diagnosis Date   Acute bilateral thoracic back pain 08/16/2019   Allergy    Cipro, Plavix, Statins   CAD (coronary artery disease)    a. s/p tandem Promus DES to LAD in 2009 by Dr. Olevia Perches;  b.  LHC (03/22/14):  prox LAD 30%, mid LAD 40%, LAD stent ok with dist 20% ISR, apical LAD occluded with L-L collats filling apical vessel (too small for PCI), mid RCA 20%, EF 70%.  Med Rx   Diabetes mellitus    Diagnosed 2012   Diverticulosis of colon (without mention of hemorrhage)    GERD (gastroesophageal reflux disease)    Herpes zoster without complication [id number]   Hx of cardiovascular stress test    a. Nuclear (08/2013):  No ischemia, EF 80%, Normal   Hx of echocardiogram    a. Echo (07/2013):  Mild LVH, vigorous LVF, EF 65-70%, Gr 1 DD, mild MR, mild to mod LAE   Hyperlipidemia    intol of statins   Hypertension    NHL (non-Hodgkin's lymphoma) ([place]) dx'd 2003   chemo/xrt comp 2003    Personal history of colonic polyps    Proctitis    Pruritic intertrigo 07/18/2014   Urticaria    Past Surgical History:  Procedure Laterality Date   cardiac cath-neg     CORONARY ANGIOPLASTY WITH STENT PLACEMENT     x 2   dexa-neg     KNEE SURGERY  10/2003   left   laser surgery for cataracts-left     LEFT HEART CATHETERIZATION WITH CORONARY ANGIOGRAM N/A 03/22/2014   Procedure: LEFT HEART CATHETERIZATION WITH CORONARY ANGIOGRAM;  Surgeon: Burnell Blanks, MD;  Location: [facility] CATH LAB;  Service: Cardiovascular;  Laterality: N/A;   stress cardiolite     VAGINAL HYSTERECTOMY     partial, fibroids, one ovary left   Family History  Problem Relation Age of Onset   Cancer Mother        jaw   Hypertension Father    Heart attack Father    Heart attack Sister    Colon cancer Neg Hx    Social History   Socioeconomic History  Marital status: Widowed    Spouse name: Not on file   Number of children: Not on file   Years of education: Not on file   Highest education level: Not on file  Occupational History   Occupation: Retired    Fish farm manager: RETIRED  Tobacco Use   Smoking status: Never   Smokeless tobacco: Never  Vaping Use   Vaping Use: Never used  Substance and Sexual Activity   Alcohol use: No   Drug use: No   Sexual activity: Not Currently  Other Topics Concern   Not on file  Social History Narrative   Not on file   Social Determinants of Health   Financial Resource Strain: Low Risk    Difficulty of Paying Living Expenses: Not hard at all  Food Insecurity: No Food Insecurity   Worried About Charity fundraiser in the Last Year: Never true   [place] in the Last Year: Never true  Transportation Needs: No Transportation Needs   Lack of Transportation (Medical): No   Lack of Transportation (Non-Medical): No  Physical Activity: Inactive   Days of Exercise per Week: 0 days   Minutes of Exercise per Session: 0 min  Stress: No Stress Concern Present    Feeling of Stress : Not at all  Social Connections: Not on file    Tobacco Counseling Counseling given: Not Answered   Clinical Intake:  Pre-visit preparation completed: Yes  Pain : No/denies pain     Nutritional Risks: None Diabetes: Yes CBG done?: No Did pt. bring in CBG monitor from home?: No  How often do you need to have someone help you when you read instructions, pamphlets, or other written materials from your doctor or pharmacy?: 1 - Never  Diabetic: Yes Nutrition Risk Assessment:  Has the patient had any N/V/D within the last 2 months?  No  Does the patient have any non-healing wounds?  No  Has the patient had any unintentional weight loss or weight gain?  No   Diabetes:  Is the patient diabetic?  Yes  If diabetic, was a CBG obtained today?  No  telephone visit  Did the patient bring in their glucometer from home?  No  telephone visit How often do you monitor your CBG's? daily.   Financial Strains and Diabetes Management:  Are you having any financial strains with the device, your supplies or your medication? No .  Does the patient want to be seen by Chronic Care Management for management of their diabetes?  No  Would the patient like to be referred to a Nutritionist or for Diabetic Management?  No   Diabetic Exams:  Diabetic Eye Exam: Completed 01/20/2021 Diabetic Foot Exam: Overdue, Pt has been advised about the importance in completing this exam. Pt is scheduled for diabetic foot exam on 07/02/2021.   Interpreter Needed?: No  Information entered by :: CJohnson, RN   Activities of Daily Living In your present state of health, do you have any difficulty performing the following activities: 06/23/2021 06/05/2021  Hearing? Y N  Comment wears hearing aids -  Vision? N N  Difficulty concentrating or making decisions? N N  Walking or climbing stairs? N N  Dressing or bathing? N N  Doing errands, shopping? N N  Preparing Food and eating ? N -  Using the  Toilet? N -  In the past six months, have you accidently leaked urine? Y -  Comment does not wear pads due to UTIs -  Do you have problems with loss of bowel control? N -  Managing your Medications? N -  Managing your Finances? N -  Housekeeping or managing your Housekeeping? N -  Some recent data might be hidden    Patient Care Team: Pleas Koch, NP as PCP - General (Internal Medicine) Burnell Blanks, MD as PCP - Cardiology (Cardiology) Rana Snare, MD (Inactive) as Consulting Physician (Urology) Ladene Artist, MD as Consulting Physician (Gastroenterology) Thelma Comp, OD as Consulting Physician (Optometry)  Indicate any recent Medical Services you may have received from other than [facility] providers in the past year (date may be approximate).     Assessment:   This is a routine wellness examination for [facility].  Hearing/Vision screen Vision Screening - Comments:: Patient gets annual eye exams   Dietary issues and exercise activities discussed: Current Exercise Habits: The patient does not participate in regular exercise at present, Exercise limited by: None identified   Goals Addressed             This Visit's Progress    Patient Stated       06/23/2021, I will maintain and continue medications as prescribed.       Depression Screen PHQ 2/9 Scores 06/23/2021 06/05/2021 06/20/2020 04/27/2019 04/27/2019 08/02/2018 04/27/2017  PHQ - 2 Score 0 0 0 0 0 0 0  PHQ- 9 Score 0 0 0 0 0 - -    Fall Risk Fall Risk  06/23/2021 06/05/2021 06/20/2020 04/27/2019 08/02/2018  Falls in the past year? 0 0 0 1 No  Comment - - - fell in garage -  Number falls in past yr: 0 0 0 0 -  Injury with Fall? 0 0 0 1 -  Risk for fall due to : Medication side effect No Fall Risks Medication side effect - -  Follow up Falls evaluation completed;Falls prevention discussed - Falls evaluation completed;Falls prevention discussed - -    FALL RISK PREVENTION PERTAINING TO THE HOME:  Any  stairs in or around the home? Yes  If so, are there any without handrails? No  Home free of loose throw rugs in walkways, pet beds, electrical cords, etc? Yes  Adequate lighting in your home to reduce risk of falls? Yes   ASSISTIVE DEVICES UTILIZED TO PREVENT FALLS:  Life alert? No  Use of a cane, walker or w/c? No  Grab bars in the bathroom? No  Shower chair or bench in shower? No  Elevated toilet seat or a handicapped toilet? No   TIMED UP AND GO:  Was the test performed?  N/A telephone visit  .    Cognitive Function: MMSE - Mini Mental State Exam 06/23/2021 06/20/2020 04/27/2019 04/27/2017 03/22/2016  Orientation to time '5 5 5 5 5  '$ Orientation to Place '5 5 5 5 5  '$ Registration '3 3 3 3 3  '$ Attention/ Calculation 5 5 0 0 0  Recall '2 3 3 3 3  '$ Language- name 2 objects - - 0 0 0  Language- repeat '1 1 1 1 1  '$ Language- follow 3 step command - - 0 3 3  Language- read & follow direction - - 0 0 0  Write a sentence - - 0 0 0  Copy design - - 0 0 0  Total score - - '17 20 20  '$ Mini Cog  Mini-Cog screen was completed. Maximum score is 22. A value of 0 denotes this part of the MMSE was not completed or the patient failed this part of  the Mini-Cog screening.       Immunizations Immunization History  Administered Date(s) Administered   Influenza Split 09/14/2011, 08/28/2012, 08/29/2020   Influenza Whole 09/09/2004, 09/05/2006, 08/31/2007, 08/29/2008, 09/18/2009, 09/01/2010   Influenza,inj,Quad PF,6+ Mos 08/26/2014, 08/06/2015, 09/14/2016, 09/08/2017, 08/02/2018, 08/16/2019   PFIZER(Purple Top)SARS-COV-2 Vaccination 01/16/2020, 02/06/2020, 09/19/2020   Pneumococcal Conjugate-13 08/26/2014   Pneumococcal Polysaccharide-23 09/09/2004, 12/25/2019   Td 05/30/1999   Zoster, Live 04/15/2010    TDAP status: Due, Education has been provided regarding the importance of this vaccine. Advised may receive this vaccine at local pharmacy or Health Dept. Aware to provide a copy of the vaccination  record if obtained from local pharmacy or Health Dept. Verbalized acceptance and understanding.  Flu Vaccine status: Up to date  Pneumococcal vaccine status: Up to date  Covid-19 vaccine status: Completed 3 vaccines  Qualifies for Shingles Vaccine? Yes   Zostavax completed Yes   Shingrix Completed?: No.    Education has been provided regarding the importance of this vaccine. Patient has been advised to call insurance company to determine out of pocket expense if they have not yet received this vaccine. Advised may also receive vaccine at local pharmacy or Health Dept. Verbalized acceptance and understanding.  Screening Tests Health Maintenance  Topic Date Due   FOOT EXAM  11/28/2021 (Originally 05/03/2019)   HEMOGLOBIN A1C  11/28/2021 (Originally 03/12/2021)   URINE MICROALBUMIN  11/28/2021 (Originally 12/26/2020)   COVID-19 Vaccine (4 - Booster for [place] series) 11/28/2021 (Originally 12/20/2020)   Zoster Vaccines- Shingrix (1 of 2) 11/28/2021 (Originally 01/29/1951)   TETANUS/TDAP  06/20/2024 (Originally 05/29/2009)   INFLUENZA VACCINE  06/29/2021   OPHTHALMOLOGY EXAM  01/20/2022   MAMMOGRAM  05/11/2022   DEXA SCAN  Completed   PNA vac Low Risk Adult  Completed   HPV VACCINES  Aged Out    Health Maintenance  There are no preventive care reminders to display for this patient.  Colorectal cancer screening: No longer required.   Mammogram status: Completed 05/11/2021. Repeat every year  Bone Density status: Completed 07/15/2020. Results reflect: Bone density results: OSTEOPENIA. Repeat every 2 years.  Lung Cancer Screening: (Low Dose CT Chest recommended if Age 31-80 years, 30 pack-year currently smoking OR have quit w/in 15years.) does not qualify.  Additional Screening:  Hepatitis C Screening: does not qualify; Completed N/A  Vision Screening: Recommended annual ophthalmology exams for early detection of glaucoma and other disorders of the eye. Is the patient up to date with  their annual eye exam?  Yes  Who is the provider or what is the name of the office in which the patient attends annual eye exams? Dr. Rick Duff  If pt is not established with a provider, would they like to be referred to a provider to establish care? No .   Dental Screening: Recommended annual dental exams for proper oral hygiene  Community Resource Referral / Chronic Care Management: CRR required this visit?  No   CCM required this visit?  No      Plan:     I have personally reviewed and noted the following in the patient's chart:   Medical and social history Use of alcohol, tobacco or illicit drugs  Current medications and supplements including opioid prescriptions.  Functional ability and status Nutritional status Physical activity Advanced directives List of other physicians Hospitalizations, surgeries, and ER visits in previous 12 months Vitals Screenings to include cognitive, depression, and falls Referrals and appointments  In addition, I have reviewed and discussed with patient certain preventive protocols,  quality metrics, and best practice recommendations. A written personalized care plan for preventive services as well as general preventive health recommendations were provided to patient.   Due to this being a telephonic visit, the after visit summary with patients personalized plan was offered to patient via office or my-chart. Patient preferred to pick up at office at next visit or via mychart.   Andrez Grime, LPN   [phone]

## 2021-06-25 ENCOUNTER — Other Ambulatory Visit: Payer: Self-pay | Admitting: Primary Care

## 2021-06-25 DIAGNOSIS — E1100 Type 2 diabetes mellitus with hyperosmolarity without nonketotic hyperglycemic-hyperosmolar coma (NKHHC): Secondary | ICD-10-CM

## 2021-06-29 ENCOUNTER — Encounter: Payer: Medicare PPO | Admitting: Primary Care

## 2021-07-02 ENCOUNTER — Other Ambulatory Visit: Payer: Self-pay

## 2021-07-02 ENCOUNTER — Encounter: Payer: Self-pay | Admitting: Primary Care

## 2021-07-02 ENCOUNTER — Ambulatory Visit (INDEPENDENT_AMBULATORY_CARE_PROVIDER_SITE_OTHER): Payer: Medicare PPO | Admitting: Primary Care

## 2021-07-02 VITALS — BP 124/64 | HR 68 | Temp 96.9°F | Ht 68.0 in | Wt 165.0 lb

## 2021-07-02 DIAGNOSIS — E1100 Type 2 diabetes mellitus with hyperosmolarity without nonketotic hyperglycemic-hyperosmolar coma (NKHHC): Secondary | ICD-10-CM | POA: Diagnosis not present

## 2021-07-02 DIAGNOSIS — I10 Essential (primary) hypertension: Secondary | ICD-10-CM | POA: Diagnosis not present

## 2021-07-02 DIAGNOSIS — Z0000 Encounter for general adult medical examination without abnormal findings: Secondary | ICD-10-CM

## 2021-07-02 DIAGNOSIS — E538 Deficiency of other specified B group vitamins: Secondary | ICD-10-CM

## 2021-07-02 DIAGNOSIS — N289 Disorder of kidney and ureter, unspecified: Secondary | ICD-10-CM

## 2021-07-02 DIAGNOSIS — M8580 Other specified disorders of bone density and structure, unspecified site: Secondary | ICD-10-CM | POA: Diagnosis not present

## 2021-07-02 DIAGNOSIS — R600 Localized edema: Secondary | ICD-10-CM | POA: Diagnosis not present

## 2021-07-02 DIAGNOSIS — E782 Mixed hyperlipidemia: Secondary | ICD-10-CM | POA: Diagnosis not present

## 2021-07-02 DIAGNOSIS — M25571 Pain in right ankle and joints of right foot: Secondary | ICD-10-CM

## 2021-07-02 DIAGNOSIS — M25572 Pain in left ankle and joints of left foot: Secondary | ICD-10-CM

## 2021-07-02 DIAGNOSIS — K589 Irritable bowel syndrome without diarrhea: Secondary | ICD-10-CM | POA: Diagnosis not present

## 2021-07-02 DIAGNOSIS — K219 Gastro-esophageal reflux disease without esophagitis: Secondary | ICD-10-CM

## 2021-07-02 DIAGNOSIS — Z6379 Other stressful life events affecting family and household: Secondary | ICD-10-CM

## 2021-07-02 LAB — LIPID PANEL
Cholesterol: 200 mg/dL (ref 0–200)
HDL: 55.5 mg/dL (ref >39.00)
LDL Cholesterol: 110 mg/dL — ABNORMAL HIGH (ref 0–99)
NonHDL: 144.22
Total CHOL/HDL Ratio: 4
Triglycerides: 173 mg/dL — ABNORMAL HIGH (ref 0.0–149.0)
VLDL: 34.6 mg/dL (ref 0.0–40.0)

## 2021-07-02 LAB — BASIC METABOLIC PANEL
BUN: 19 mg/dL (ref 6–23)
CO2: 29 mEq/L (ref 19–32)
Calcium: 9.6 mg/dL (ref 8.4–10.5)
Chloride: 103 mEq/L (ref 96–112)
Creatinine, Ser: 1.28 mg/dL — ABNORMAL HIGH (ref 0.40–1.20)
GFR: 37.69 mL/min — ABNORMAL LOW (ref >60.00)
Glucose, Bld: 119 mg/dL — ABNORMAL HIGH (ref 70–99)
Potassium: 5.4 mEq/L — ABNORMAL HIGH (ref 3.5–5.1)
Sodium: 140 mEq/L (ref 135–145)

## 2021-07-02 LAB — VITAMIN B12: Vitamin B-12: 1124 pg/mL — ABNORMAL HIGH (ref 211–911)

## 2021-07-02 LAB — HEMOGLOBIN A1C: Hgb A1c MFr Bld: 8.8 % — ABNORMAL HIGH (ref 4.6–6.5)

## 2021-07-02 MED ORDER — SERTRALINE HCL 100 MG PO TABS: 100.0000 mg | ORAL_TABLET | Freq: Every day | ORAL | 1 refills | Status: DC

## 2021-07-02 MED ORDER — PANTOPRAZOLE SODIUM 20 MG PO TBEC: 20.0000 mg | DELAYED_RELEASE_TABLET | Freq: Every day | ORAL | 3 refills | Status: DC

## 2021-07-02 NOTE — Assessment & Plan Note (Signed)
Compliant to glipizide XL 10 mg, continue same.  Repeat A1C pending.  Managed on Zetia for lipid control, cannot tolerate statin. Urine microalbumin UTD.  Foot and eye exam UTD.  Follow up in 3-6 months based off of A1C result.

## 2021-07-02 NOTE — Assessment & Plan Note (Signed)
No concerns today, continue to monitor.

## 2021-07-02 NOTE — Assessment & Plan Note (Signed)
Overall doing better with Zoloft 50 mg, but does want to try a dose increase of 100 mg since the passing of her husband.   Will increase Zoloft to 100 mg, she will update.

## 2021-07-02 NOTE — Assessment & Plan Note (Addendum)
Mostly right side, following with podiatry. Doing well on gabapentin 100 mg BID and Meloxicam 15 mg daily, continue same.   Will check renal function today.

## 2021-07-02 NOTE — Assessment & Plan Note (Signed)
Bone density scan UTD. Compliant to calcium and vitamin D. Encouraged weight bearing exercise.

## 2021-07-02 NOTE — Assessment & Plan Note (Signed)
Immunizations UTD. Mammogram UTD.  Bone density scan UTD.  Encouraged regular exercise, healthy diet. Exam today stable. Labs pending and reviewed.

## 2021-07-02 NOTE — Progress Notes (Signed)
Subjective:    Patient ID: [name] [name], female    DOB: [birth year], 87 y.o.   MRN: [id number]  HPI  [name] [name] is a very pleasant 87 y.o. female who presents today for complete physical and follow up of chronic conditions.  Immunizations: -Tetanus: 2000 -Influenza: Due this season  -Covid-19: 3 vaccines -Shingles: Zostavax in 2011 -Pneumonia: Prevnar 13 in 2015, Pneumovax in 2021  Diet: Silt.  Exercise: No regular exercise.  Eye exam: Completes annually  Dental exam: Completes semi-annually   Mammogram: Completed in June 2022 Dexa: Completed in 2021  BP Readings from Last 3 Encounters:  07/02/21 124/64  05/08/21 128/70  04/30/21 (!) 141/85       Review of Systems  Constitutional:  Negative for unexpected weight change.  HENT:  Negative for rhinorrhea.   Respiratory:  Negative for cough and shortness of breath.   Cardiovascular:  Negative for chest pain.  Gastrointestinal:  Negative for constipation and diarrhea.  Genitourinary:  Negative for difficulty urinating.  Musculoskeletal:  Positive for arthralgias.  Skin:  Negative for rash.  Allergic/Immunologic: Positive for environmental allergies.  Neurological:  Negative for dizziness and headaches.  Psychiatric/Behavioral:  The patient is nervous/anxious.        Increased symptoms of anxiety and depression, overall doing well on Zoloft 50 mg, is requesting a dose to 100 mg.         Past Medical History:  Diagnosis Date   Acute bilateral thoracic back pain 08/16/2019   Allergy    Cipro, Plavix, Statins   CAD (coronary artery disease)    a. s/p tandem Promus DES to LAD in 2009 by Dr. Olevia Perches;  b.  LHC (03/22/14):  prox LAD 30%, mid LAD 40%, LAD stent ok with dist 20% ISR, apical LAD occluded with L-L collats filling apical vessel (too small for PCI), mid RCA 20%, EF 70%.  Med Rx   Diabetes mellitus    Diagnosed 2012   Diverticulosis of colon (without mention of hemorrhage)    GERD (gastroesophageal  reflux disease)    Herpes zoster without complication [id number]   Hx of cardiovascular stress test    a. Nuclear (08/2013):  No ischemia, EF 80%, Normal   Hx of echocardiogram    a. Echo (07/2013):  Mild LVH, vigorous LVF, EF 65-70%, Gr 1 DD, mild MR, mild to mod LAE   Hyperlipidemia    intol of statins   Hypertension    NHL (non-Hodgkin's lymphoma) ([place]) dx'd 2003   chemo/xrt comp 2003   Personal history of colonic polyps    Proctitis    Pruritic intertrigo 07/18/2014   Urticaria     Social History   Socioeconomic History   Marital status: Widowed    Spouse name: Not on file   Number of children: Not on file   Years of education: Not on file   Highest education level: Not on file  Occupational History   Occupation: Retired    Fish farm manager: RETIRED  Tobacco Use   Smoking status: Never   Smokeless tobacco: Never  Vaping Use   Vaping Use: Never used  Substance and Sexual Activity   Alcohol use: No   Drug use: No   Sexual activity: Not Currently  Other Topics Concern   Not on file  Social History Narrative   Not on file   Social Determinants of Health   Financial Resource Strain: Low Risk    Difficulty of Paying Living Expenses: Not hard at all  Food  Insecurity: No Food Insecurity   Worried About Charity fundraiser in the Last Year: Never true   Ran Out of Food in the Last Year: Never true  Transportation Needs: No Transportation Needs   Lack of Transportation (Medical): No   Lack of Transportation (Non-Medical): No  Physical Activity: Inactive   Days of Exercise per Week: 0 days   Minutes of Exercise per Session: 0 min  Stress: No Stress Concern Present   Feeling of Stress : Not at all  Social Connections: Not on file  Intimate Partner Violence: Not At Risk   Fear of Current or Ex-Partner: No   Emotionally Abused: No   Physically Abused: No   Sexually Abused: No    Past Surgical History:  Procedure Laterality Date   cardiac cath-neg     CORONARY  ANGIOPLASTY WITH STENT PLACEMENT     x 2   dexa-neg     KNEE SURGERY  10/2003   left   laser surgery for cataracts-left     LEFT HEART CATHETERIZATION WITH CORONARY ANGIOGRAM N/A 03/22/2014   Procedure: LEFT HEART CATHETERIZATION WITH CORONARY ANGIOGRAM;  Surgeon: Burnell Blanks, MD;  Location: [facility] CATH LAB;  Service: Cardiovascular;  Laterality: N/A;   stress cardiolite     VAGINAL HYSTERECTOMY     partial, fibroids, one ovary left    Family History  Problem Relation Age of Onset   Cancer Mother        jaw   Hypertension Father    Heart attack Father    Heart attack Sister    Colon cancer Neg Hx     Allergies  Allergen Reactions   Amoxicillin Hives   Other Rash and Anaphylaxis   Shellfish Allergy Anaphylaxis and Rash   Cephalexin     REACTION: trash and swelling REACTION: trash and swelling   Ciprofloxacin     REACTION: u/k   Clopidogrel Bisulfate     REACTION: swelling, rash   Codeine Phosphate     REACTION: unspecified   Ezetimibe-Simvastatin     REACTION: myalgias REACTION: myalgias   Hydrocod Polst-Cpm Polst Er     REACTION: u/k   Lidocaine Hives    ONLY TO ADHESIVE LIDOCAINE PATCHES. NO PROBLEM WITH INJECTABLE LIDOCAINE.   Nitrofurantoin     REACTION: [place] REACTION: [place]   Pregabalin     REACTION: felt bad REACTION: felt bad   Propoxyphene N-Acetaminophen     REACTION: [place]   Rofecoxib     unk unk    Sulfamethoxazole     REACTION: unspecified   Sulfonamide Derivatives     REACTION: unspecified   Celecoxib Rash    REACTION: [place] REACTION: [place]   Codeine Rash    REACTION: [place] REACTION: [place]   Hydrocod Polst-Cpm Polst Er Rash   Propoxyphene Rash   Sulfa Antibiotics Rash    REACTION: unspecified REACTION: unspecified    Current Outpatient Medications on File Prior to Visit  Medication Sig Dispense Refill   aspirin 81 MG tablet Take 81 mg by mouth daily.     Blood Glucose Calibration (TRUE METRIX LEVEL 1) Low SOLN Use to calibrate meter 3 each 0    Blood Glucose Monitoring Suppl (TRUE METRIX METER) DEVI Test blood sugars once or twice daily for diabetes. 1 each 0   brimonidine-timolol (COMBIGAN) 0.2-0.5 % ophthalmic solution Place 1 drop into both eyes every 12 (twelve) hours.     Cholecalciferol (VITAMIN D-3) 1000 UNITS CAPS Take 1 capsule by mouth daily.  ezetimibe (ZETIA) 10 MG tablet Take 1 tablet (10 mg total) by mouth daily. Please call and schedule a one year follow up appointment for further refills 1st attempt 90 tablet 0   fexofenadine (ALLEGRA) 180 MG tablet Take 1 tablet (180 mg total) by mouth as needed for allergies or rhinitis. 90 tablet 0   gabapentin (NEURONTIN) 100 MG capsule Take 1 capsule (100 mg total) by mouth 3 (three) times daily. 270 capsule 1   glipiZIDE (GLUCOTROL XL) 10 MG 24 hr tablet TAKE ONE TABLET BY MOUTH EVERY MORNING WITH BREAKFAST for diabetes. 90 tablet 0   glucose blood (TRUE METRIX BLOOD GLUCOSE TEST) test strip TEST BLOOD SUGAR ONCE TO TWICE DAILY FOR DIABETES. 200 strip 1   isosorbide mononitrate (IMDUR) 60 MG 24 hr tablet Take 1 tablet (60 mg total) by mouth daily. Please call and schedule a one year follow up appointment for further refills 1st attempt 90 tablet 0   meloxicam (MOBIC) 15 MG tablet Take 1 tablet (15 mg total) by mouth daily. 30 tablet 1   metoprolol succinate (TOPROL-XL) 100 MG 24 hr tablet TAKE ONE TABLET BY MOUTH EVERY MORNING WITH OR IMMEDIATELY FOLLOWING A MEAL Please call and schedule a one year follow up appointment for further refills 1st attempt 90 tablet 0   nitroGLYCERIN (NITROSTAT) 0.4 MG SL tablet Place 1 tablet (0.4 mg total) under the tongue every 5 (five) minutes as needed for chest pain. 26 tablet 0   timolol (TIMOPTIC) 0.5 % ophthalmic solution      TRUEplus Lancets 33G MISC TEST BLOOD SUGAR ONCE TO TWICE DAILY FOR DIABETES. 200 each 3   vitamin B-12 (CYANOCOBALAMIN) 1000 MCG tablet Take 1,000 mcg by mouth daily.     [DISCONTINUED] pantoprazole (PROTONIX) 40 MG  tablet TAKE ONE TABLET BY MOUTH DAILY FOR HEARTBURN 90 tablet 0   [DISCONTINUED] sertraline (ZOLOFT) 50 MG tablet Take 1 tablet (50 mg total) by mouth daily. For anxiety. 90 tablet 3   furosemide (LASIX) 20 MG tablet TAKE ONE TABLET BY MOUTH DAILY FOR LEG SWELLING (Patient not taking: Reported on 07/02/2021) 90 tablet 0   [DISCONTINUED] loratadine (CLARITIN) 10 MG tablet Take 10 mg by mouth as needed.      No current facility-administered medications on file prior to visit.    BP 124/64   Pulse 68   Temp (!) 96.9 F (36.1 C) (Temporal)   Ht '5\' 8"'$  (1.727 m)   Wt 165 lb (74.8 kg)   SpO2 97%   BMI 25.09 kg/m  Objective:   Physical Exam HENT:     Right Ear: Tympanic membrane and ear canal normal.     Left Ear: Tympanic membrane and ear canal normal.     Nose: Nose normal.  Eyes:     Conjunctiva/sclera: Conjunctivae normal.     Pupils: Pupils are equal, round, and reactive to light.  Neck:     Thyroid: No thyromegaly.  Cardiovascular:     Rate and Rhythm: Normal rate and regular rhythm.     Heart sounds: No murmur heard. Pulmonary:     Effort: Pulmonary effort is normal.     Breath sounds: Normal breath sounds. No rales.  Abdominal:     General: Bowel sounds are normal.     Palpations: Abdomen is soft.     Tenderness: There is no abdominal tenderness.  Musculoskeletal:        General: Normal range of motion.     Cervical back: Neck supple.  Lymphadenopathy:  Cervical: No cervical adenopathy.  Skin:    General: Skin is warm and dry.     Findings: No rash.  Neurological:     Mental Status: She is alert and oriented to person, place, and time.     Cranial Nerves: No cranial nerve deficit.     Deep Tendon Reflexes: Reflexes are normal and symmetric.  Psychiatric:        Mood and Affect: Mood normal.          Assessment & Plan:      This visit occurred during the SARS-CoV-2 public health emergency.  Safety protocols were in place, including screening questions  prior to the visit, additional usage of staff PPE, and extensive cleaning of exam room while observing appropriate contact time as indicated for disinfecting solutions.

## 2021-07-02 NOTE — Assessment & Plan Note (Signed)
Cautioned with use of Meloxicam 15 mg. Repeat renal function pending.

## 2021-07-02 NOTE — Assessment & Plan Note (Signed)
Denies concerns, no recent use of furosemide 20 mg, uses PRN. Continue same.

## 2021-07-02 NOTE — Assessment & Plan Note (Signed)
Stable with pantoprazole 40 mg, no breakthrough symptoms, has been taking for years. Will reduce to 20 mg and see how she does.   She will update.

## 2021-07-02 NOTE — Assessment & Plan Note (Signed)
Well controlled in the office today, continue metoprolol succinate 100 mg daily.

## 2021-07-02 NOTE — Assessment & Plan Note (Signed)
Compliant to Zetia 10 mg, cannot tolerate statins. Repeat lipid panel pending.

## 2021-07-02 NOTE — Assessment & Plan Note (Signed)
Compliant to B12 500 mcg once daily, continue same. Repeat B12 level pending.

## 2021-07-02 NOTE — Patient Instructions (Signed)
Stop by the lab prior to leaving today. I will notify you of your results once received.   We increased the dose of your sertraline (Zoloft) to 100 mg for anxiety and depression.   We reduced the dose of your pantoprazole to 20 mg for heartburn.   Please schedule a follow up visit for 6 months for diabetes check.  It was a pleasure to see you today!  Preventive Care 85 Years and Older, Female Preventive care refers to lifestyle choices and visits with your health care provider that can promote health and wellness. This includes: A yearly physical exam. This is also called an annual wellness visit. Regular dental and eye exams. Immunizations. Screening for certain conditions. Healthy lifestyle choices, such as: Eating a healthy diet. Getting regular exercise. Not using drugs or products that contain nicotine and tobacco. Limiting alcohol use. What can I expect for my preventive care visit? Physical exam Your health care provider will check your: Height and weight. These may be used to calculate your BMI (body mass index). BMI is a measurement that tells if you are at a healthy weight. Heart rate and blood pressure. Body temperature. Skin for abnormal spots. Counseling Your health care provider may ask you questions about your: Past medical problems. Family's medical history. Alcohol, tobacco, and drug use. Emotional well-being. Home life and relationship well-being. Sexual activity. Diet, exercise, and sleep habits. History of falls. Memory and ability to understand (cognition). Work and work Statistician. Pregnancy and menstrual history. Access to firearms. What immunizations do I need?  Vaccines are usually given at various ages, according to a schedule. Your health care provider will recommend vaccines for you based on your age, medicalhistory, and lifestyle or other factors, such as travel or where you work. What tests do I need? Blood tests Lipid and cholesterol  levels. These may be checked every 5 years, or more often depending on your overall health. Hepatitis C test. Hepatitis B test. Screening Lung cancer screening. You may have this screening every year starting at age 90 if you have a 30-pack-year history of smoking and currently smoke or have quit within the past 15 years. Colorectal cancer screening. All adults should have this screening starting at age 24 and continuing until age 29. Your health care provider may recommend screening at age 68 if you are at increased risk. You will have tests every 1-10 years, depending on your results and the type of screening test. Diabetes screening. This is done by checking your blood sugar (glucose) after you have not eaten for a while (fasting). You may have this done every 1-3 years. Mammogram. This may be done every 1-2 years. Talk with your health care provider about how often you should have regular mammograms. Abdominal aortic aneurysm (AAA) screening. You may need this if you are a current or former smoker. BRCA-related cancer screening. This may be done if you have a family history of breast, ovarian, tubal, or peritoneal cancers. Other tests STD (sexually transmitted disease) testing, if you are at risk. Bone density scan. This is done to screen for osteoporosis. You may have this done starting at age 27. Talk with your health care provider about your test results, treatment options,and if necessary, the need for more tests. Follow these instructions at home: Eating and drinking  Eat a diet that includes fresh fruits and vegetables, whole grains, lean protein, and low-fat dairy products. Limit your intake of foods with high amounts of sugar, saturated fats, and salt. Take vitamin and  mineral supplements as recommended by your health care provider. Do not drink alcohol if your health care provider tells you not to drink. If you drink alcohol: Limit how much you have to 0-1 drink a day. Be  aware of how much alcohol is in your drink. In the [place] one drink equals one 12 oz bottle of beer (355 mL), one 5 oz glass of wine (148 mL), or one 1 oz glass of hard liquor (44 mL).  Lifestyle Take daily care of your teeth and gums. Brush your teeth every morning and night with fluoride toothpaste. Floss one time each day. Stay active. Exercise for at least 30 minutes 5 or more days each week. Do not use any products that contain nicotine or tobacco, such as cigarettes, e-cigarettes, and chewing tobacco. If you need help quitting, ask your health care provider. Do not use drugs. If you are sexually active, practice safe sex. Use a condom or other form of protection in order to prevent STIs (sexually transmitted infections). Talk with your health care provider about taking a low-dose aspirin or statin. Find healthy ways to cope with stress, such as: Meditation, yoga, or listening to music. Journaling. Talking to a trusted person. Spending time with friends and family. Safety Always wear your seat belt while driving or riding in a vehicle. Do not drive: If you have been drinking alcohol. Do not ride with someone who has been drinking. When you are tired or distracted. While texting. Wear a helmet and other protective equipment during sports activities. If you have firearms in your house, make sure you follow all gun safety procedures. What's next? Visit your health care provider once a year for an annual wellness visit. Ask your health care provider how often you should have your eyes and teeth checked. Stay up to date on all vaccines. This information is not intended to replace advice given to you by your health care provider. Make sure you discuss any questions you have with your healthcare provider. Document Revised: 11/05/2020 Document Reviewed: 11/09/2018 Elsevier Patient Education  2022 [employer].

## 2021-07-03 ENCOUNTER — Other Ambulatory Visit: Payer: Self-pay | Admitting: Primary Care

## 2021-07-03 DIAGNOSIS — E875 Hyperkalemia: Secondary | ICD-10-CM

## 2021-07-09 ENCOUNTER — Other Ambulatory Visit (INDEPENDENT_AMBULATORY_CARE_PROVIDER_SITE_OTHER): Payer: Medicare PPO

## 2021-07-09 ENCOUNTER — Other Ambulatory Visit: Payer: Self-pay

## 2021-07-09 DIAGNOSIS — E875 Hyperkalemia: Secondary | ICD-10-CM | POA: Diagnosis not present

## 2021-07-09 LAB — POTASSIUM: Potassium: 4.6 mEq/L (ref 3.5–5.1)

## 2021-07-17 ENCOUNTER — Other Ambulatory Visit: Payer: Self-pay

## 2021-07-17 ENCOUNTER — Other Ambulatory Visit (INDEPENDENT_AMBULATORY_CARE_PROVIDER_SITE_OTHER): Payer: Medicare PPO

## 2021-07-17 DIAGNOSIS — I10 Essential (primary) hypertension: Secondary | ICD-10-CM

## 2021-07-17 LAB — RENAL FUNCTION PANEL
Albumin: 3.9 g/dL (ref 3.5–5.2)
BUN: 20 mg/dL (ref 6–23)
CO2: 28 mEq/L (ref 19–32)
Calcium: 9 mg/dL (ref 8.4–10.5)
Chloride: 105 mEq/L (ref 96–112)
Creatinine, Ser: 1.32 mg/dL — ABNORMAL HIGH (ref 0.40–1.20)
GFR: 36.31 mL/min — ABNORMAL LOW (ref >60.00)
Glucose, Bld: 185 mg/dL — ABNORMAL HIGH (ref 70–99)
Phosphorus: 3.4 mg/dL (ref 2.3–4.6)
Potassium: 4.8 mEq/L (ref 3.5–5.1)
Sodium: 139 mEq/L (ref 135–145)

## 2021-07-28 ENCOUNTER — Encounter: Payer: Self-pay | Admitting: Podiatry

## 2021-07-28 ENCOUNTER — Ambulatory Visit: Payer: Medicare PPO | Admitting: Podiatry

## 2021-07-28 ENCOUNTER — Other Ambulatory Visit: Payer: Self-pay

## 2021-07-28 DIAGNOSIS — M79674 Pain in right toe(s): Secondary | ICD-10-CM

## 2021-07-28 DIAGNOSIS — E0843 Diabetes mellitus due to underlying condition with diabetic autonomic (poly)neuropathy: Secondary | ICD-10-CM

## 2021-07-28 DIAGNOSIS — M79675 Pain in left toe(s): Secondary | ICD-10-CM | POA: Diagnosis not present

## 2021-07-28 DIAGNOSIS — B351 Tinea unguium: Secondary | ICD-10-CM | POA: Diagnosis not present

## 2021-07-28 NOTE — Progress Notes (Signed)
   SUBJECTIVE Patient with a history of diabetes mellitus presents to office today complaining of elongated, thickened nails that cause pain while ambulating in shoes.  Patient is unable to trim their own nails. Patient is here for further evaluation and treatment.   Past Medical History:  Diagnosis Date   Acute bilateral thoracic back pain 08/16/2019   Allergy    Cipro, Plavix, Statins   CAD (coronary artery disease)    a. s/p tandem Promus DES to LAD in 2009 by Dr. Olevia Perches;  b.  LHC (03/22/14):  prox LAD 30%, mid LAD 40%, LAD stent ok with dist 20% ISR, apical LAD occluded with L-L collats filling apical vessel (too small for PCI), mid RCA 20%, EF 70%.  Med Rx   Diabetes mellitus    Diagnosed 2012   Diverticulosis of colon (without mention of hemorrhage)    GERD (gastroesophageal reflux disease)    Herpes zoster without complication [id number]   Hx of cardiovascular stress test    a. Nuclear (08/2013):  No ischemia, EF 80%, Normal   Hx of echocardiogram    a. Echo (07/2013):  Mild LVH, vigorous LVF, EF 65-70%, Gr 1 DD, mild MR, mild to mod LAE   Hyperlipidemia    intol of statins   Hypertension    NHL (non-Hodgkin's lymphoma) ([place]) dx'd 2003   chemo/xrt comp 2003   Personal history of colonic polyps    Proctitis    Pruritic intertrigo 07/18/2014   Urticaria     OBJECTIVE General Patient is awake, alert, and oriented x 3 and in no acute distress. Derm Skin is dry and supple bilateral. Negative open lesions or macerations. Remaining integument unremarkable. Nails are tender, long, thickened and dystrophic with subungual debris, consistent with onychomycosis, 1-5 bilateral. No signs of infection noted. Vasc  DP and PT pedal pulses palpable bilaterally. Temperature gradient within normal limits.  Neuro Epicritic and protective threshold sensation diminished bilaterally.  Musculoskeletal Exam No symptomatic pedal deformities noted bilateral. Muscular strength within normal  limits.  ASSESSMENT 1. Diabetes Mellitus w/ peripheral neuropathy 2.  Pain due to onychomycosis of toenails bilateral  PLAN OF CARE 1. Patient evaluated today. 2. Instructed to maintain good pedal hygiene and foot care. Stressed importance of controlling blood sugar.  3. Mechanical debridement of nails 1-5 bilaterally performed using a nail nipper. Filed with dremel without incident.  4. Return to clinic in 3 mos.     Edrick Kins, DPM [facility]  Dr. Edrick Kins, DPM    [address], [place] [address]                Office [phone]  Fax [phone]

## 2021-07-31 ENCOUNTER — Telehealth: Payer: Self-pay | Admitting: Podiatry

## 2021-07-31 NOTE — Telephone Encounter (Signed)
Pt left message stating she was trying to get diabetic shoes and she just seen her doctor and they discussed it. If I could resend the paperwork over she will sign off on it.. She also asked if I could change the shoe from the green one to the black one.  I returned call to pt and left message that I have ordered the black one per her request and will fax paperwork to pcp.

## 2021-08-15 ENCOUNTER — Other Ambulatory Visit: Payer: Self-pay | Admitting: Podiatry

## 2021-08-27 DIAGNOSIS — H16141 Punctate keratitis, right eye: Secondary | ICD-10-CM | POA: Diagnosis not present

## 2021-09-01 ENCOUNTER — Other Ambulatory Visit: Payer: Self-pay | Admitting: Cardiovascular Disease

## 2021-09-03 ENCOUNTER — Telehealth: Payer: Self-pay

## 2021-09-03 NOTE — Telephone Encounter (Signed)
Patient called requesting on refill on her Gabapentin 100mg  to [facility]. Please advise

## 2021-09-03 NOTE — Telephone Encounter (Signed)
"  I need for Dr. Amalia Hailey to call in my prescription for Gabapentin 100 mg capsules at [facility].  I'm going to switch all my medicines back here.  Please give me a call and let me know what to do."

## 2021-09-04 ENCOUNTER — Other Ambulatory Visit: Payer: Self-pay | Admitting: Podiatry

## 2021-09-04 ENCOUNTER — Telehealth: Payer: Self-pay | Admitting: Podiatry

## 2021-09-04 NOTE — Telephone Encounter (Signed)
Pt left message yesterday afternoon for a call back about her diabetic shoes.  I returned call after checking and explained the documents were signed by the np and the md and is not compliant with medicare. I have refaxed it to the pcp office with a note asking for only the doctor to sign it. I did ask pt to call me back in 3 to 4 wks if she did not hear back from me.

## 2021-09-07 ENCOUNTER — Other Ambulatory Visit: Payer: Self-pay | Admitting: Podiatry

## 2021-09-07 ENCOUNTER — Other Ambulatory Visit: Payer: Self-pay | Admitting: Primary Care

## 2021-09-07 DIAGNOSIS — E0821 Diabetes mellitus due to underlying condition with diabetic nephropathy: Secondary | ICD-10-CM

## 2021-09-07 MED ORDER — GABAPENTIN 100 MG PO CAPS: 100.0000 mg | ORAL_CAPSULE | Freq: Three times a day (TID) | ORAL | 1 refills | Status: DC

## 2021-09-07 NOTE — Telephone Encounter (Signed)
Prescription sent

## 2021-09-18 DIAGNOSIS — Z83511 Family history of glaucoma: Secondary | ICD-10-CM | POA: Diagnosis not present

## 2021-09-18 DIAGNOSIS — H401133 Primary open-angle glaucoma, bilateral, severe stage: Secondary | ICD-10-CM | POA: Diagnosis not present

## 2021-09-23 DIAGNOSIS — E1142 Type 2 diabetes mellitus with diabetic polyneuropathy: Secondary | ICD-10-CM | POA: Diagnosis not present

## 2021-09-23 DIAGNOSIS — F419 Anxiety disorder, unspecified: Secondary | ICD-10-CM | POA: Diagnosis not present

## 2021-09-23 DIAGNOSIS — E1143 Type 2 diabetes mellitus with diabetic autonomic (poly)neuropathy: Secondary | ICD-10-CM | POA: Diagnosis not present

## 2021-09-23 DIAGNOSIS — E1165 Type 2 diabetes mellitus with hyperglycemia: Secondary | ICD-10-CM | POA: Diagnosis not present

## 2021-09-23 DIAGNOSIS — I10 Essential (primary) hypertension: Secondary | ICD-10-CM | POA: Diagnosis not present

## 2021-09-23 DIAGNOSIS — G8929 Other chronic pain: Secondary | ICD-10-CM | POA: Diagnosis not present

## 2021-09-23 DIAGNOSIS — H409 Unspecified glaucoma: Secondary | ICD-10-CM | POA: Diagnosis not present

## 2021-09-23 DIAGNOSIS — E785 Hyperlipidemia, unspecified: Secondary | ICD-10-CM | POA: Diagnosis not present

## 2021-09-23 DIAGNOSIS — F325 Major depressive disorder, single episode, in full remission: Secondary | ICD-10-CM | POA: Diagnosis not present

## 2021-09-29 ENCOUNTER — Telehealth: Payer: Self-pay | Admitting: Podiatry

## 2021-09-29 NOTE — Telephone Encounter (Signed)
Pt called and left message checking status of diabetic shoes since they were changed to the black shoes.  I returned call and explained that we did not receive the documents until around 10.17 and it looks like shoes should be shipping anytime and someone will call pt to schedule an appt to pick them up. She said thank you.

## 2021-10-05 ENCOUNTER — Encounter: Payer: Self-pay | Admitting: Physician Assistant

## 2021-10-05 ENCOUNTER — Ambulatory Visit: Payer: Medicare PPO | Admitting: Physician Assistant

## 2021-10-05 ENCOUNTER — Other Ambulatory Visit: Payer: Self-pay | Admitting: Cardiovascular Disease

## 2021-10-05 ENCOUNTER — Other Ambulatory Visit: Payer: Self-pay

## 2021-10-05 VITALS — BP 144/72 | HR 70 | Ht 69.0 in | Wt 169.8 lb

## 2021-10-05 DIAGNOSIS — I10 Essential (primary) hypertension: Secondary | ICD-10-CM | POA: Diagnosis not present

## 2021-10-05 DIAGNOSIS — I351 Nonrheumatic aortic (valve) insufficiency: Secondary | ICD-10-CM

## 2021-10-05 DIAGNOSIS — E782 Mixed hyperlipidemia: Secondary | ICD-10-CM

## 2021-10-05 DIAGNOSIS — I2510 Atherosclerotic heart disease of native coronary artery without angina pectoris: Secondary | ICD-10-CM

## 2021-10-05 NOTE — Assessment & Plan Note (Signed)
Blood pressure somewhat above target.  I have asked her to monitor blood pressure over the next 2 weeks and send those to me for review.  If her blood pressure remains above target, we will need to adjust her medical therapy.  For now, she will continue isosorbide mononitrate 60 mg daily, metoprolol succinate 100 mg daily.

## 2021-10-05 NOTE — Patient Instructions (Signed)
Medication Instructions:   Your physician recommends that you continue on your current medications as directed. Please refer to the Current Medication list given to you today.  *If you need a refill on your cardiac medications before your next appointment, please call your pharmacy*   Lab Work:  -NONE  If you have labs (blood work) drawn today and your tests are completely normal, you will receive your results only by: [place] (if you have MyChart) OR A paper copy in the mail If you have any lab test that is abnormal or we need to change your treatment, we will call you to review the results.   Testing/Procedures:  Your physician has requested that you have an echocardiogram. Echocardiography is a painless test that uses sound waves to create images of your heart. It provides your doctor with information about the size and shape of your heart and how well your heart's chambers and valves are working. This procedure takes approximately one hour. There are no restrictions for this procedure.    Follow-Up: At [facility], you and your health needs are our priority.  As part of our continuing mission to provide you with exceptional heart care, we have created designated Provider Care Teams.  These Care Teams include your primary Cardiologist (physician) and Advanced Practice Providers (APPs -  Physician Assistants and Nurse Practitioners) who all work together to provide you with the care you need, when you need it.  We recommend signing up for the patient portal called "MyChart".  Sign up information is provided on this After Visit Summary.  MyChart is used to connect with patients for Virtual Visits (Telemedicine).  Patients are able to view lab/test results, encounter notes, upcoming appointments, etc.  Non-urgent messages can be sent to your provider as well.   To learn more about what you can do with MyChart, go to [email or web address].    Your next appointment:   1  year(s)  The format for your next appointment:   In Person  Provider:   Lauree Chandler, MD     Other Instructions  Your physician wants you to follow-up in: 1 year with Dr. Angelena Form. You will receive a reminder letter in the mail two months in advance. If you don't receive a letter, please call our office to schedule the follow-up appointment.  You have been referred to Lipid clinic.  Please take blood pressure X 3 times weekly, please send update every week X 3 Weeks.

## 2021-10-05 NOTE — Progress Notes (Signed)
Cardiology Office Note:    Date:  10/05/2021   ID:  FAIRY ASHLOCK, DOB 1932/02/21, MRN [id number]  PCP:  Pleas Koch, NP   [facility] Providers Cardiologist:  Lauree Chandler, MD    Referring MD: Pleas Koch, NP   Chief Complaint:  Follow-up for CAD    Patient Profile:   [name] [name] is a 87 y.o. female with:  Coronary artery disease S/p DES x2 to the LAD in 2009 cath 4/15: LAD stent patent, apical LAD occluded with left-left collaterals>> med Rx Myoview 10/17: Low risk Echo 5/21: EF 60-65, Gr II DD, severe LVH, BAE Aortic insufficiency Mild by echo 5/21; repeat echo due in 04/23/2022 Hyperlipidemia Intolerant of statins Hypertension Non-Hodgkin's lymphoma Diabetes mellitus GERD  History of Present Illness: [name] [name] was last seen in 04/2020 by Dr. Angelena Form.  She returns for follow-up.  She is here alone.  Unfortunately, her husband passed away in Apr 24, 2023.  She is still grieving.  She continues to have rare anginal episodes.  There has been no significant change.  She has not had significant shortness of breath, orthopnea, leg edema.  She has not had syncope.  She did have a recent episode of vertigo  ASSESSMENT & PLAN:   Coronary artery disease involving native coronary artery of native heart without angina pectoris History of DES times to the LAD in 2009.  Cardiac catheterization in 2015 with patent LAD stent and occluded apical LAD with L-L collaterals.  This has been managed medically.  Myoview in 2017 was low risk.  She has rare episodes of angina.  There has been no significant change in her pattern.  Her electrocardiogram is unchanged.  She is intolerant of statins.  Continue aspirin 81 mg daily, ezetimibe 10 mg daily, isosorbide 60 mg daily, metoprolol succinate 100 mg daily.  Follow-up in 1 year.  Essential hypertension Blood pressure somewhat above target.  I have asked her to monitor blood pressure over the next 2 weeks and send those to me for review.   If her blood pressure remains above target, we will need to adjust her medical therapy.  For now, she will continue isosorbide mononitrate 60 mg daily, metoprolol succinate 100 mg daily.  MIXED HYPERLIPIDEMIA Recent LDL 110.  We discussed the importance of keeping LDL <70.  She is intolerant of statins.  She notes a prior history of an allergic reaction.  Continue ezetimibe 10 mg daily.  Referred to Pharm.D. clinic to see if she is a candidate for PCSK9 inhibitor therapy.  Nonrheumatic aortic valve insufficiency Mild by echocardiogram in 04/23/20.  Plan is to repeat another echocardiogram in 04/23/2022.  I will make sure that this is ordered.            Dispo:  Return in about 1 year (around 10/05/2022) for Routine Follow Up with Dr. Angelena Form.    Prior CV studies: Echocardiogram 04/24/2020 EF 60-65, no RWMA, severe LVH, GRII DD, normal RVSF, RVSP 26.4, mild-moderate LAE, mild AI, AV sclerosis without stenosis  Myoview 09/22/2016 EF 76, normal perfusion; low risk  ABIs 06/09/2015 Normal  Cardiac catheterization (03/22/14):   prox LAD 30%, mid LAD 40%, LAD stent ok with dist 20% ISR, apical LAD occluded with L-L collats filling apical vessel (too small for PCI), mid RCA 20%, EF 70%.  Med Rx.   Past Medical History:  Diagnosis Date   Acute bilateral thoracic back pain 08/16/2019   Allergy    Cipro, Plavix, Statins   CAD (coronary artery  disease)    a. s/p tandem Promus DES to LAD in 2009 by Dr. Olevia Perches;  b.  LHC (03/22/14):  prox LAD 30%, mid LAD 40%, LAD stent ok with dist 20% ISR, apical LAD occluded with L-L collats filling apical vessel (too small for PCI), mid RCA 20%, EF 70%.  Med Rx   Diabetes mellitus    Diagnosed 2012   Diverticulosis of colon (without mention of hemorrhage)    GERD (gastroesophageal reflux disease)    Herpes zoster without complication 06/17/9469   Hx of cardiovascular stress test    a. Nuclear (08/2013):  No ischemia, EF 80%, Normal   Hx of echocardiogram    a.  Echo (07/2013):  Mild LVH, vigorous LVF, EF 65-70%, Gr 1 DD, mild MR, mild to mod LAE   Hyperlipidemia    intol of statins   Hypertension    NHL (non-Hodgkin's lymphoma) ([place]) dx'd 2003   chemo/xrt comp 2003   Personal history of colonic polyps    Proctitis    Pruritic intertrigo 07/18/2014   Urticaria    Current Medications: Current Meds  Medication Sig   aspirin 81 MG tablet Take 81 mg by mouth daily.   Blood Glucose Calibration (TRUE METRIX LEVEL 1) Low SOLN Use to calibrate meter   Blood Glucose Monitoring Suppl (TRUE METRIX METER) DEVI Test blood sugars once or twice daily for diabetes.   brimonidine-timolol (COMBIGAN) 0.2-0.5 % ophthalmic solution Place 1 drop into both eyes every 12 (twelve) hours.   Cholecalciferol (VITAMIN D-3) 1000 UNITS CAPS Take 1 capsule by mouth daily.   ezetimibe (ZETIA) 10 MG tablet TAKE 1 TABLET DAILY. PLEASE CALL AND SCHEDULE A 1 YEAR FOLLOW UP APPOINTMENT FOR FURTHER REFILLS 1ST ATTEMPT   fexofenadine (ALLEGRA) 180 MG tablet Take 1 tablet (180 mg total) by mouth as needed for allergies or rhinitis.   gabapentin (NEURONTIN) 100 MG capsule Take 100 mg by mouth 2 (two) times daily.   glipiZIDE (GLUCOTROL XL) 10 MG 24 hr tablet TAKE 1 TABLET EVERY MORNING WITH BREAKFAST FOR DIABETES   glucose blood (TRUE METRIX BLOOD GLUCOSE TEST) test strip TEST BLOOD SUGAR ONCE TO TWICE DAILY FOR DIABETES.   isosorbide mononitrate (IMDUR) 60 MG 24 hr tablet TAKE 1 TABLET DAILY (PLEASE CALL AND SCHEDULE A ONE YEAR FOLLOW UP APPOINTMENT FOR FURTHER REFILLS 1ST ATTEMPT)   metoprolol succinate (TOPROL-XL) 100 MG 24 hr tablet TAKE 1 TABLET EVERY MORNING WITH OR IMMEDIATELY FOLLOWING A MEAL (NEED MD APPOINTMENT)   nitroGLYCERIN (NITROSTAT) 0.4 MG SL tablet Place 1 tablet (0.4 mg total) under the tongue every 5 (five) minutes as needed for chest pain.   pantoprazole (PROTONIX) 20 MG tablet Take 1 tablet (20 mg total) by mouth daily. For heartburn.   sertraline (ZOLOFT) 100 MG  tablet Take 1 tablet (100 mg total) by mouth daily. For anxiety and depression   timolol (TIMOPTIC) 0.5 % ophthalmic solution    TRUEplus Lancets 33G MISC TEST BLOOD SUGAR ONCE TO TWICE DAILY FOR DIABETES.   vitamin B-12 (CYANOCOBALAMIN) 1000 MCG tablet Take 1,000 mcg by mouth daily.    Allergies:   Amoxicillin, Other, Shellfish allergy, Cephalexin, Ciprofloxacin, Clopidogrel bisulfate, Codeine phosphate, Ezetimibe-simvastatin, Hydrocod polst-cpm polst er, Lidocaine, Nitrofurantoin, Pregabalin, Propoxyphene n-acetaminophen, Rofecoxib, Sulfamethoxazole, Sulfonamide derivatives, Celecoxib, Codeine, Hydrocod polst-cpm polst er, Propoxyphene, and Sulfa antibiotics   Social History   Tobacco Use   Smoking status: Never   Smokeless tobacco: Never  Vaping Use   Vaping Use: Never used  Substance Use Topics  Alcohol use: No   Drug use: No    Family Hx: The patient's family history includes Cancer in her mother; Heart attack in her father and sister; Hypertension in her father. There is no history of Colon cancer.  Review of Systems  Cardiovascular:  Negative for claudication.    EKGs/Labs/Other Test Reviewed:    EKG:  EKG is  ordered today.  The ekg ordered today demonstrates R, HR 70, normal axis, inferior Q waves, nonspecific ST-T wave changes, QTC 449, no change from prior tracing  Recent Labs: 01/21/2021: ALT 33 04/30/2021: Hemoglobin 13.2; Platelets 197 07/17/2021: BUN 20; Creatinine, Ser 1.32; Potassium 4.8; Sodium 139   Recent Lipid Panel Lab Results  Component Value Date/Time   CHOL 200 07/02/2021 11:21 AM   TRIG 173.0 (H) 07/02/2021 11:21 AM   HDL 55.50 07/02/2021 11:21 AM   LDLCALC 110 (H) 07/02/2021 11:21 AM   LDLDIRECT 93.0 08/06/2015 08:47 AM     Risk Assessment/Calculations:          Physical Exam:    VS:  BP (!) 144/72   Pulse 70   Ht 5\' 9"  (1.753 m)   Wt 169 lb 12.8 oz (77 kg)   SpO2 97%   BMI 25.08 kg/m     Wt Readings from Last 3 Encounters:  10/05/21  169 lb 12.8 oz (77 kg)  07/02/21 165 lb (74.8 kg)  05/08/21 167 lb 12 oz (76.1 kg)    Constitutional:      Appearance: Healthy appearance. Not in distress.  Neck:     Vascular: No JVR. JVD normal.  Pulmonary:     Effort: Pulmonary effort is normal.     Breath sounds: No wheezing. No rales.  Cardiovascular:     Normal rate. Regular rhythm. Normal S1. Normal S2.      Murmurs: There is no murmur.  Edema:    Peripheral edema absent.  Abdominal:     Palpations: Abdomen is soft. There is no hepatomegaly.  Skin:    General: Skin is warm and dry.  Neurological:     Mental Status: Alert and oriented to person, place and time.     Cranial Nerves: Cranial nerves are intact.       Medication Adjustments/Labs and Tests Ordered: Current medicines are reviewed at length with the patient today.  Concerns regarding medicines are outlined above.  Tests Ordered: No orders of the defined types were placed in this encounter.  Medication Changes: No orders of the defined types were placed in this encounter.  Signed, Richardson Dopp, PA-C  10/05/2021 2:17 PM    [facility] [address], [place], [place]  [address] Phone: [phone]; Fax: [phone]

## 2021-10-05 NOTE — Assessment & Plan Note (Signed)
Recent LDL 110.  We discussed the importance of keeping LDL <70.  She is intolerant of statins.  She notes a prior history of an allergic reaction.  Continue ezetimibe 10 mg daily.  Referred to Pharm.D. clinic to see if she is a candidate for PCSK9 inhibitor therapy.

## 2021-10-05 NOTE — Assessment & Plan Note (Signed)
Mild by echocardiogram in 03/2020.  Plan is to repeat another echocardiogram in 03/2022.  I will make sure that this is ordered.

## 2021-10-05 NOTE — Assessment & Plan Note (Signed)
History of DES times to the LAD in 2009.  Cardiac catheterization in 2015 with patent LAD stent and occluded apical LAD with L-L collaterals.  This has been managed medically.  Myoview in 2017 was low risk.  She has rare episodes of angina.  There has been no significant change in her pattern.  Her electrocardiogram is unchanged.  She is intolerant of statins.  Continue aspirin 81 mg daily, ezetimibe 10 mg daily, isosorbide 60 mg daily, metoprolol succinate 100 mg daily.  Follow-up in 1 year.

## 2021-10-07 ENCOUNTER — Telehealth: Payer: Self-pay

## 2021-10-07 ENCOUNTER — Ambulatory Visit: Payer: Medicare PPO | Admitting: Primary Care

## 2021-10-07 ENCOUNTER — Encounter: Payer: Self-pay | Admitting: Primary Care

## 2021-10-07 ENCOUNTER — Other Ambulatory Visit: Payer: Self-pay

## 2021-10-07 VITALS — BP 112/62 | HR 67 | Temp 98.0°F | Ht 69.0 in | Wt 169.0 lb

## 2021-10-07 DIAGNOSIS — N1832 Chronic kidney disease, stage 3b: Secondary | ICD-10-CM

## 2021-10-07 DIAGNOSIS — Z23 Encounter for immunization: Secondary | ICD-10-CM

## 2021-10-07 DIAGNOSIS — E1122 Type 2 diabetes mellitus with diabetic chronic kidney disease: Secondary | ICD-10-CM

## 2021-10-07 DIAGNOSIS — E1100 Type 2 diabetes mellitus with hyperosmolarity without nonketotic hyperglycemic-hyperosmolar coma (NKHHC): Secondary | ICD-10-CM

## 2021-10-07 LAB — POCT GLYCOSYLATED HEMOGLOBIN (HGB A1C): Hemoglobin A1C: 7.4 % — AB (ref 4.0–5.6)

## 2021-10-07 MED ORDER — FREESTYLE LIBRE 2 READER DEVI: 0 refills | Status: DC

## 2021-10-07 MED ORDER — FREESTYLE LIBRE 2 SENSOR MISC: 1 refills | Status: DC

## 2021-10-07 NOTE — Assessment & Plan Note (Signed)
Improved with A1c of 7.4 today!  Continue glipizide XL 10 mg daily.  We discussed the freestyle libre monitor so that she can identify specific foods that cause spikes in glucose.  Prescription for freestyle libre 2 sensor and receiver sent to pharmacy.  We discussed the system together, her granddaughter will help her set this up.  We will see her back in a couple of months as scheduled.

## 2021-10-07 NOTE — Progress Notes (Signed)
Subjective:    Patient ID: [name] [name], female    DOB: Dec 12, 1931, 87 y.o.   MRN: [id number]  HPI  [name] [name] is a very pleasant 87 y.o. female with a history of type 2 diabetes, hypertension, CAD, CKD, hyperlipidemia who presents today for follow up of diabetes.  Current medications include: Glipizide XL 10 mg  She is checking her blood glucose 1 times daily and is getting readings of:  AM fasting: 170's-190's  Last A1C: 8.8 in August 2022, 7.4 today Last Eye Exam: UTD Last Foot Exam: UTD Pneumonia Vaccination: 2021 Urine Microalbumin: UTD Statin: none. Intolerant.   Dietary changes since last visit: Cutting back on sweets and potatoes.  She is frustrated with her high glucose readings despite efforts to improve her diet.   Exercise: None.       Review of Systems  Respiratory:  Negative for shortness of breath.   Cardiovascular:  Negative for chest pain.  Neurological:  Negative for dizziness.        Past Medical History:  Diagnosis Date   Acute bilateral thoracic back pain 08/16/2019   Allergy    Cipro, Plavix, Statins   CAD (coronary artery disease)    a. s/p tandem Promus DES to LAD in 2009 by Dr. Olevia Perches;  b.  LHC (03/22/14):  prox LAD 30%, mid LAD 40%, LAD stent ok with dist 20% ISR, apical LAD occluded with L-L collats filling apical vessel (too small for PCI), mid RCA 20%, EF 70%.  Med Rx   Diabetes mellitus    Diagnosed 2012   Diverticulosis of colon (without mention of hemorrhage)    GERD (gastroesophageal reflux disease)    Herpes zoster without complication 07/10/7516   Hx of cardiovascular stress test    a. Nuclear (08/2013):  No ischemia, EF 80%, Normal   Hx of echocardiogram    a. Echo (07/2013):  Mild LVH, vigorous LVF, EF 65-70%, Gr 1 DD, mild MR, mild to mod LAE   Hyperlipidemia    intol of statins   Hypertension    NHL (non-Hodgkin's lymphoma) ([place]) dx'd 2003   chemo/xrt comp 2003   Personal history of colonic polyps    Proctitis     Pruritic intertrigo 07/18/2014   Urticaria     Social History   Socioeconomic History   Marital status: Widowed    Spouse name: Not on file   Number of children: Not on file   Years of education: Not on file   Highest education level: Not on file  Occupational History   Occupation: Retired    Fish farm manager: RETIRED  Tobacco Use   Smoking status: Never   Smokeless tobacco: Never  Vaping Use   Vaping Use: Never used  Substance and Sexual Activity   Alcohol use: No   Drug use: No   Sexual activity: Not Currently  Other Topics Concern   Not on file  Social History Narrative   Not on file   Social Determinants of Health   Financial Resource Strain: Low Risk    Difficulty of Paying Living Expenses: Not hard at all  Food Insecurity: No Food Insecurity   Worried About Charity fundraiser in the Last Year: Never true   [place] in the Last Year: Never true  Transportation Needs: No Transportation Needs   Lack of Transportation (Medical): No   Lack of Transportation (Non-Medical): No  Physical Activity: Inactive   Days of Exercise per Week: 0 days   Minutes  of Exercise per Session: 0 min  Stress: No Stress Concern Present   Feeling of Stress : Not at all  Social Connections: Not on file  Intimate Partner Violence: Not At Risk   Fear of Current or Ex-Partner: No   Emotionally Abused: No   Physically Abused: No   Sexually Abused: No    Past Surgical History:  Procedure Laterality Date   cardiac cath-neg     CORONARY ANGIOPLASTY WITH STENT PLACEMENT     x 2   dexa-neg     KNEE SURGERY  10/2003   left   laser surgery for cataracts-left     LEFT HEART CATHETERIZATION WITH CORONARY ANGIOGRAM N/A 03/22/2014   Procedure: LEFT HEART CATHETERIZATION WITH CORONARY ANGIOGRAM;  Surgeon: Burnell Blanks, MD;  Location: [facility] CATH LAB;  Service: Cardiovascular;  Laterality: N/A;   stress cardiolite     VAGINAL HYSTERECTOMY     partial, fibroids, one ovary left    Family  History  Problem Relation Age of Onset   Cancer Mother        jaw   Hypertension Father    Heart attack Father    Heart attack Sister    Colon cancer Neg Hx     Allergies  Allergen Reactions   Amoxicillin Hives   Other Rash and Anaphylaxis   Shellfish Allergy Anaphylaxis and Rash   Cephalexin     REACTION: trash and swelling REACTION: trash and swelling   Ciprofloxacin     REACTION: u/k   Clopidogrel Bisulfate     REACTION: swelling, rash   Codeine Phosphate     REACTION: unspecified   Ezetimibe-Simvastatin     REACTION: myalgias REACTION: myalgias   Hydrocod Polst-Cpm Polst Er     REACTION: u/k   Lidocaine Hives    ONLY TO ADHESIVE LIDOCAINE PATCHES. NO PROBLEM WITH INJECTABLE LIDOCAINE.   Nitrofurantoin     REACTION: [place] REACTION: [place]   Pregabalin     REACTION: felt bad REACTION: felt bad   Propoxyphene N-Acetaminophen     REACTION: [place]   Rofecoxib     unk unk    Sulfamethoxazole     REACTION: unspecified   Sulfonamide Derivatives     REACTION: unspecified   Celecoxib Rash    REACTION: [place] REACTION: [place]   Codeine Rash    REACTION: [place] REACTION: [place]   Hydrocod Polst-Cpm Polst Er Rash   Propoxyphene Rash   Sulfa Antibiotics Rash    REACTION: unspecified REACTION: unspecified    Current Outpatient Medications on File Prior to Visit  Medication Sig Dispense Refill   aspirin 81 MG tablet Take 81 mg by mouth daily.     Blood Glucose Calibration (TRUE METRIX LEVEL 1) Low SOLN Use to calibrate meter 3 each 0   Blood Glucose Monitoring Suppl (TRUE METRIX METER) DEVI Test blood sugars once or twice daily for diabetes. 1 each 0   brimonidine-timolol (COMBIGAN) 0.2-0.5 % ophthalmic solution Place 1 drop into both eyes every 12 (twelve) hours.     Cholecalciferol (VITAMIN D-3) 1000 UNITS CAPS Take 1 capsule by mouth daily.     ezetimibe (ZETIA) 10 MG tablet TAKE 1 TABLET DAILY. PLEASE CALL AND SCHEDULE A 1 YEAR FOLLOW UP APPOINTMENT FOR FURTHER REFILLS 1ST ATTEMPT 90  tablet 0   fexofenadine (ALLEGRA) 180 MG tablet Take 1 tablet (180 mg total) by mouth as needed for allergies or rhinitis. 90 tablet 0   gabapentin (NEURONTIN) 100 MG capsule Take 100 mg by mouth  2 (two) times daily.     glipiZIDE (GLUCOTROL XL) 10 MG 24 hr tablet TAKE 1 TABLET EVERY MORNING WITH BREAKFAST FOR DIABETES 90 tablet 0   glucose blood (TRUE METRIX BLOOD GLUCOSE TEST) test strip TEST BLOOD SUGAR ONCE TO TWICE DAILY FOR DIABETES. 200 strip 1   isosorbide mononitrate (IMDUR) 60 MG 24 hr tablet TAKE 1 TABLET DAILY (PLEASE CALL AND SCHEDULE A ONE YEAR FOLLOW UP APPOINTMENT FOR FURTHER REFILLS 1ST ATTEMPT) 90 tablet 0   metoprolol succinate (TOPROL-XL) 100 MG 24 hr tablet TAKE 1 TABLET EVERY MORNING WITH OR IMMEDIATELY FOLLOWING A MEAL (NEED MD APPOINTMENT) 90 tablet 0   nitroGLYCERIN (NITROSTAT) 0.4 MG SL tablet Place 1 tablet (0.4 mg total) under the tongue every 5 (five) minutes as needed for chest pain. 26 tablet 0   pantoprazole (PROTONIX) 20 MG tablet Take 1 tablet (20 mg total) by mouth daily. For heartburn. 90 tablet 3   sertraline (ZOLOFT) 100 MG tablet Take 1 tablet (100 mg total) by mouth daily. For anxiety and depression 90 tablet 1   timolol (TIMOPTIC) 0.5 % ophthalmic solution      TRUEplus Lancets 33G MISC TEST BLOOD SUGAR ONCE TO TWICE DAILY FOR DIABETES. 200 each 3   vitamin B-12 (CYANOCOBALAMIN) 1000 MCG tablet Take 1,000 mcg by mouth daily.     [DISCONTINUED] loratadine (CLARITIN) 10 MG tablet Take 10 mg by mouth as needed.      No current facility-administered medications on file prior to visit.    BP 112/62 (BP Location: Left Arm, Patient Position: Sitting, Cuff Size: Normal)   Pulse 67   Temp 98 F (36.7 C) (Temporal)   Ht 5\' 9"  (1.753 m)   Wt 169 lb (76.7 kg)   SpO2 97%   BMI 24.96 kg/m  Objective:   Physical Exam Cardiovascular:     Rate and Rhythm: Normal rate and regular rhythm.  Pulmonary:     Effort: Pulmonary effort is normal.     Breath sounds:  Normal breath sounds.  Skin:    General: Skin is warm and dry.  Psychiatric:        Mood and Affect: Mood normal.          Assessment & Plan:      This visit occurred during the SARS-CoV-2 public health emergency.  Safety protocols were in place, including screening questions prior to the visit, additional usage of staff PPE, and extensive cleaning of exam room while observing appropriate contact time as indicated for disinfecting solutions.

## 2021-10-07 NOTE — Patient Instructions (Addendum)
Continue taking Glipizide XL 10 mg once daily for diabetes.   Try the [facility] monitor for blood sugar checks. Have Claiborne Billings help you set this up.  It was a pleasure to see you today!

## 2021-10-07 NOTE — Telephone Encounter (Signed)
[name] [name] 2 is not covered by insurance and requires a PA. According to PA; Omnipod DASH pods (gen 4) are the only thing not requiring a PA.

## 2021-10-08 NOTE — Telephone Encounter (Signed)
Could we try the PA?

## 2021-10-12 ENCOUNTER — Other Ambulatory Visit: Payer: Self-pay | Admitting: *Deleted

## 2021-10-12 MED ORDER — ISOSORBIDE MONONITRATE ER 60 MG PO TB24: 90.0000 mg | ORAL_TABLET | Freq: Every day | ORAL | 3 refills | Status: DC

## 2021-10-12 NOTE — Telephone Encounter (Signed)
Her BP could be a little better. Let's increase her Isosorbide to 90 mg once daily.   90 day supply w 3 refills This will also help with her occasional chest pain. Richardson Dopp, PA-C    10/12/2021 11:23 AM

## 2021-10-13 NOTE — Telephone Encounter (Signed)
Will you notify patient? What about Dexcom?

## 2021-10-13 NOTE — Telephone Encounter (Signed)
Response received from cover my meds it is Excluded on pharmacy benefit,

## 2021-10-16 ENCOUNTER — Other Ambulatory Visit: Payer: Self-pay | Admitting: Cardiovascular Disease

## 2021-10-19 ENCOUNTER — Telehealth: Payer: Self-pay | Admitting: Primary Care

## 2021-10-19 NOTE — Telephone Encounter (Signed)
[name] called in from [name] stated that [name] [name] is using them again and wanted to know about getting a on refill on Zoloft.

## 2021-10-19 NOTE — Telephone Encounter (Signed)
Pt needs a refill on sertraline (ZOLOFT) 100 MG tablet sent to [name]

## 2021-10-20 NOTE — Telephone Encounter (Signed)
I was under the impression that she never stopped taking Zoloft 100 mg.  A few things:  I sent a 6 month supply to her mail order pharmacy in August 2022 so she has refills.  How long has she been off Zoloft? If for >2 weeks then will need to start with a smaller dose.  If she's been taking and is inquiring about refills then she needs to request refills from her mail order pharmacy.  Let me know please!

## 2021-10-20 NOTE — Telephone Encounter (Signed)
Patient advised. Patient would like to continue with using Metrix finger meter and checking her sugars by finger at this time. If she decides she wants to try something else she will let us know FYI to [place]

## 2021-10-20 NOTE — Telephone Encounter (Signed)
Noted  

## 2021-10-21 NOTE — Telephone Encounter (Signed)
Left message to return call to our office.  

## 2021-10-21 NOTE — Telephone Encounter (Signed)
Called patient she was able to get transferred from mail order and had filled today. She has been taking with not stopping.

## 2021-10-21 NOTE — Telephone Encounter (Signed)
Pt returning your call

## 2021-10-29 ENCOUNTER — Ambulatory Visit: Payer: Medicare PPO

## 2021-10-30 ENCOUNTER — Ambulatory Visit: Payer: Medicare PPO | Admitting: Podiatry

## 2021-10-30 ENCOUNTER — Encounter: Payer: Self-pay | Admitting: Podiatry

## 2021-10-30 ENCOUNTER — Other Ambulatory Visit: Payer: Self-pay

## 2021-10-30 DIAGNOSIS — M7751 Other enthesopathy of right foot: Secondary | ICD-10-CM | POA: Diagnosis not present

## 2021-10-30 DIAGNOSIS — B351 Tinea unguium: Secondary | ICD-10-CM | POA: Diagnosis not present

## 2021-10-30 DIAGNOSIS — M79675 Pain in left toe(s): Secondary | ICD-10-CM

## 2021-10-30 DIAGNOSIS — E0843 Diabetes mellitus due to underlying condition with diabetic autonomic (poly)neuropathy: Secondary | ICD-10-CM

## 2021-10-30 DIAGNOSIS — M79674 Pain in right toe(s): Secondary | ICD-10-CM

## 2021-10-30 MED ORDER — BETAMETHASONE SOD PHOS & ACET 6 (3-3) MG/ML IJ SUSP
3.0000 mg | Freq: Once | INTRAMUSCULAR | Status: AC
Start: 2021-10-30 — End: 2021-10-30
  Administered 2021-10-30: 3 mg via INTRA_ARTICULAR

## 2021-10-30 NOTE — Progress Notes (Signed)
   SUBJECTIVE Patient with a history of diabetes mellitus presents to office today complaining of elongated, thickened nails that cause pain while ambulating in shoes.  Patient is unable to trim their own nails.  Patient also states that she is having some pain and tenderness to the right forefoot.  She is requesting another injection.  She says the injections helped significantly to reduce her pain for few months.  Patient is here for further evaluation and treatment.   Past Medical History:  Diagnosis Date   Acute bilateral thoracic back pain 08/16/2019   Allergy    Cipro, Plavix, Statins   CAD (coronary artery disease)    a. s/p tandem Promus DES to LAD in 2009 by Dr. Olevia Perches;  b.  LHC (03/22/14):  prox LAD 30%, mid LAD 40%, LAD stent ok with dist 20% ISR, apical LAD occluded with L-L collats filling apical vessel (too small for PCI), mid RCA 20%, EF 70%.  Med Rx   Diabetes mellitus    Diagnosed 2012   Diverticulosis of colon (without mention of hemorrhage)    GERD (gastroesophageal reflux disease)    Herpes zoster without complication 9/37/9024   Hx of cardiovascular stress test    a. Nuclear (08/2013):  No ischemia, EF 80%, Normal   Hx of echocardiogram    a. Echo (07/2013):  Mild LVH, vigorous LVF, EF 65-70%, Gr 1 DD, mild MR, mild to mod LAE   Hyperlipidemia    intol of statins   Hypertension    NHL (non-Hodgkin's lymphoma) ([place]) dx'd 2003   chemo/xrt comp 2003   Personal history of colonic polyps    Proctitis    Pruritic intertrigo 07/18/2014   Urticaria     OBJECTIVE General Patient is awake, alert, and oriented x 3 and in no acute distress. Derm Skin is dry and supple bilateral. Negative open lesions or macerations. Remaining integument unremarkable. Nails are tender, long, thickened and dystrophic with subungual debris, consistent with onychomycosis, 1-5 bilateral. No signs of infection noted. Vasc  DP and PT pedal pulses palpable bilaterally. Temperature gradient within  normal limits.  Neuro Epicritic and protective threshold sensation diminished bilaterally.  Musculoskeletal Exam No symptomatic pedal deformities noted bilateral. Muscular strength within normal limits.  Pain on palpation range of motion noted to the second MTP joint of the right foot  ASSESSMENT 1. Diabetes Mellitus w/ peripheral neuropathy 2.  Pain due to onychomycosis of toenails bilateral 3.  Second MTP capsulitis right  PLAN OF CARE 1. Patient evaluated today. 2. Instructed to maintain good pedal hygiene and foot care. Stressed importance of controlling blood sugar.  3. Mechanical debridement of nails 1-5 bilaterally performed using a nail nipper. Filed with dremel without incident.  4.  Injection of 0.5 cc Celestone Soluspan injection of the second MTP joint right  5.  Return to clinic in 3 mos.     Edrick Kins, DPM [facility]  Dr. Edrick Kins, DPM    [address], [place] [address]                Office [phone]  Fax [phone]

## 2021-11-02 ENCOUNTER — Ambulatory Visit: Payer: Medicare PPO

## 2021-11-02 ENCOUNTER — Other Ambulatory Visit: Payer: Self-pay

## 2021-11-02 DIAGNOSIS — E1140 Type 2 diabetes mellitus with diabetic neuropathy, unspecified: Secondary | ICD-10-CM | POA: Diagnosis not present

## 2021-11-02 DIAGNOSIS — E0843 Diabetes mellitus due to underlying condition with diabetic autonomic (poly)neuropathy: Secondary | ICD-10-CM

## 2021-11-02 DIAGNOSIS — M2041 Other hammer toe(s) (acquired), right foot: Secondary | ICD-10-CM | POA: Diagnosis not present

## 2021-11-02 DIAGNOSIS — M2042 Other hammer toe(s) (acquired), left foot: Secondary | ICD-10-CM | POA: Diagnosis not present

## 2021-11-02 NOTE — Progress Notes (Signed)
SITUATION Reason for Visit: Fitting of Diabetic Shoes & Insoles Patient / Caregiver Report:  Patient is satisfied with shoes and insoles  OBJECTIVE DATA: Patient History / Diagnosis:  Diabetes mellitus due to underlying condition with diabetic autonomic neuropathy, unspecified whether long term insulin use ([place])  Change in Status:   None  ACTIONS PERFORMED: In-Person Delivery, patient was fit with: - 1x pair A5500 PDAC approved prefabricated Diabetic Shoes: orthofeet - 3x pair [id number] PDAC approved CAM milled custom diabetic insoles  Shoes and insoles were verified for structural integrity and safety. Patient wore shoes and insoles in office. Skin was inspected and free of areas of concern after wearing shoes and inserts. Shoes and inserts fit properly. Patient / Caregiver provided with ferbal instruction and demonstration regarding donning, doffing, wear, care, proper fit, function, purpose, cleaning, and use of shoes and insoles ' and in all related precautions and risks and benefits regarding shoes and insoles. Patient / Caregiver was instructed to wear properly fitting socks with shoes at all times. Patient was also provided with verbal instruction regarding how to report any failures or malfunctions of shoes or inserts, and necessary follow up care. Patient / Caregiver was also instructed to contact physician regarding change in status that may affect function of shoes and inserts.   Patient / Caregiver verbalized undersatnding of instruction provided. Patient / Caregiver demonstrated independence with proper donning and doffing of shoes and inserts.  PLAN Patient to follow up as needed. Plan of care was discussed with and agreed upon by patient and/or caregiver. All questions were answered and concerns addressed.

## 2021-11-04 ENCOUNTER — Telehealth: Payer: Self-pay | Admitting: Podiatry

## 2021-11-04 NOTE — Telephone Encounter (Signed)
Pt called stating the diabetic shoes she got on Monday are causing pain in her legs and discomfort in her ankles where they tie. She said they feel to big/wide. No sores or blisters. I have pt scheduled for 12.16 in [place] to bring the shoes back..(she has only worn around the inside of house). I did tell pt not to wear them if they are causing pain and discomfort and she will see Aaron Edelman on 12.16 in [place].

## 2021-11-13 ENCOUNTER — Ambulatory Visit: Payer: Medicare PPO

## 2021-11-13 ENCOUNTER — Other Ambulatory Visit: Payer: Self-pay

## 2021-11-13 ENCOUNTER — Telehealth: Payer: Self-pay | Admitting: Podiatry

## 2021-11-13 DIAGNOSIS — E0843 Diabetes mellitus due to underlying condition with diabetic autonomic (poly)neuropathy: Secondary | ICD-10-CM

## 2021-11-13 NOTE — Telephone Encounter (Signed)
Called pt to schedule her for Monday 12.19 to pick up the shoes in [place]. Left message and then called cell and pt is now scheduled for 12.19 @ 3 in [place] office.

## 2021-11-13 NOTE — Progress Notes (Signed)
SITUATION Reason for Consult: Follow-up with diabetic shoes and insoles Patient / Caregiver Report: Patient reports the left shoe hurts her bunion  OBJECTIVE DATA History / Diagnosis:    ICD-10-CM   1. Diabetes mellitus due to underlying condition with diabetic autonomic neuropathy, unspecified whether long term insulin use ([facility])  E08.43       Change in Pathology: None  ACTIONS PERFORMED Patient's equipment was checked for structural stability and fit. Re-measured patient's shoe size and agreed with patient that she is 9.24M, not 10W. Discussed with patient the benefits of a strechy lycra shoe. Patient agrees she would enjoy the A3200W shoe. All questions answered and concerns addressed.  PLAN Return Orthofeet shoes, order A3200W 9.5W shoes. Plan of care discussed with and agreed upon by patient / caregiver.

## 2021-11-15 NOTE — Progress Notes (Signed)
Patient ID: [name] [name]                 DOB: 06/04/1932                    MRN: [id number]     HPI: [name] [name] is a 87 y.o. female patient referred to lipid clinic by Dr. Angelena Form. PMH is significant for CAD s/p DES x2 to LAD in 2009, follow-up cath in 2015 showed patent stent and occluded apical LAD with collaterals that was medically managed, HLD intolerant to statins, HTN, non-Hodgkin's lymphoma, diabetes, and GERD.   She is here today to determine if she is a candidate for PCSK9i therapy. Upon speaking with the patient she is not interested in injectable therapies. She had hives to simvastatin and pravastatin. She is apprehensive to take new medications, due to her many allergies and states she almost canceled the appointment. We discussed rationale for use of cholesterol medications to decrease risk of heart attack and stroke.   She is changing insurance in 11/2021 and does not yet, have her new insurance card. Nexlizet is currently tier 2 on her 2022 insurance and requires a PA. I would expect the copay to be ~$47/month for the patient which is expensive for her.   Current Medications: ezetimibe 10 mg daily Intolerances: simvastatin, pravastatin (hives) Risk Factors: known CAD, HTN, DM LDL goal: <55 mg/dL  Diet: Doesn't eat that much, bananas/fruit, soup with veggies, doesn't cook much now  Exercise: not doing much exercise in the last 6 months, had to walk a lot to look after husband who passed earlier this year, walking to town with kids sometimes  Family History: sister CABG, dad heart trouble  Social History: never smoking  Labs: 06/2021: TC 200, HDL 55, LDL 110, TG 173  (on ezetimibe) 05/2020: TC 169, HDL 47, LDL 93, TG 143 (on ezetimibe)   Past Medical History:  Diagnosis Date   Acute bilateral thoracic back pain 08/16/2019   Allergy    Cipro, Plavix, Statins   CAD (coronary artery disease)    a. s/p tandem Promus DES to LAD in 2009 by Dr. Olevia Perches;  b.  LHC (03/22/14):   prox LAD 30%, mid LAD 40%, LAD stent ok with dist 20% ISR, apical LAD occluded with L-L collats filling apical vessel (too small for PCI), mid RCA 20%, EF 70%.  Med Rx   Diabetes mellitus    Diagnosed 2012   Diverticulosis of colon (without mention of hemorrhage)    GERD (gastroesophageal reflux disease)    Herpes zoster without complication 05/19/3085   Hx of cardiovascular stress test    a. Nuclear (08/2013):  No ischemia, EF 80%, Normal   Hx of echocardiogram    a. Echo (07/2013):  Mild LVH, vigorous LVF, EF 65-70%, Gr 1 DD, mild MR, mild to mod LAE   Hyperlipidemia    intol of statins   Hypertension    NHL (non-Hodgkin's lymphoma) ([place]) dx'd 2003   chemo/xrt comp 2003   Personal history of colonic polyps    Proctitis    Pruritic intertrigo 07/18/2014   Urticaria     Current Outpatient Medications on File Prior to Visit  Medication Sig Dispense Refill   aspirin 81 MG tablet Take 81 mg by mouth daily.     Blood Glucose Calibration (TRUE METRIX LEVEL 1) Low SOLN Use to calibrate meter 3 each 0   Blood Glucose Monitoring Suppl (TRUE METRIX METER) DEVI Test blood sugars once  or twice daily for diabetes. 1 each 0   brimonidine-timolol (COMBIGAN) 0.2-0.5 % ophthalmic solution Place 1 drop into both eyes every 12 (twelve) hours.     Cholecalciferol (VITAMIN D-3) 1000 UNITS CAPS Take 1 capsule by mouth daily.     Continuous Blood Gluc Receiver (FREESTYLE LIBRE 2 READER) DEVI Use to check blood sugars. 1 each 0   Continuous Blood Gluc Sensor (FREESTYLE LIBRE 2 SENSOR) MISC Apply every 14 days to check blood sugars. 6 each 1   ezetimibe (ZETIA) 10 MG tablet TAKE ONE TABLET BY MOUTH DAILY 90 tablet 3   fexofenadine (ALLEGRA) 180 MG tablet Take 1 tablet (180 mg total) by mouth as needed for allergies or rhinitis. 90 tablet 0   gabapentin (NEURONTIN) 100 MG capsule Take 100 mg by mouth 2 (two) times daily.     glipiZIDE (GLUCOTROL XL) 10 MG 24 hr tablet TAKE 1 TABLET EVERY MORNING WITH BREAKFAST  FOR DIABETES 90 tablet 0   glucose blood (TRUE METRIX BLOOD GLUCOSE TEST) test strip TEST BLOOD SUGAR ONCE TO TWICE DAILY FOR DIABETES. 200 strip 1   isosorbide mononitrate (IMDUR) 60 MG 24 hr tablet Take 1.5 tablets (90 mg total) by mouth daily. 135 tablet 3   metoprolol succinate (TOPROL-XL) 100 MG 24 hr tablet TAKE 1 TABLET EVERY MORNING WITH OR IMMEDIATELY FOLLOWING A MEAL (NEED MD APPOINTMENT) 90 tablet 0   nitroGLYCERIN (NITROSTAT) 0.4 MG SL tablet Place 1 tablet (0.4 mg total) under the tongue every 5 (five) minutes as needed for chest pain. 26 tablet 0   pantoprazole (PROTONIX) 20 MG tablet Take 1 tablet (20 mg total) by mouth daily. For heartburn. 90 tablet 3   sertraline (ZOLOFT) 100 MG tablet Take 1 tablet (100 mg total) by mouth daily. For anxiety and depression 90 tablet 1   timolol (TIMOPTIC) 0.5 % ophthalmic solution      TRUEplus Lancets 33G MISC TEST BLOOD SUGAR ONCE TO TWICE DAILY FOR DIABETES. 200 each 3   vitamin B-12 (CYANOCOBALAMIN) 1000 MCG tablet Take 1,000 mcg by mouth daily.     [DISCONTINUED] loratadine (CLARITIN) 10 MG tablet Take 10 mg by mouth as needed.      No current facility-administered medications on file prior to visit.    Allergies  Allergen Reactions   Amoxicillin Hives   Other Rash and Anaphylaxis   Shellfish Allergy Anaphylaxis and Rash   Cephalexin     REACTION: trash and swelling REACTION: trash and swelling   Ciprofloxacin     REACTION: u/k   Clopidogrel Bisulfate     REACTION: swelling, rash   Codeine Phosphate     REACTION: unspecified   Ezetimibe-Simvastatin     REACTION: myalgias REACTION: myalgias   Hydrocod Polst-Cpm Polst Er     REACTION: u/k   Lidocaine Hives    ONLY TO ADHESIVE LIDOCAINE PATCHES. NO PROBLEM WITH INJECTABLE LIDOCAINE.   Nitrofurantoin     REACTION: [place] REACTION: [place]   Pregabalin     REACTION: felt bad REACTION: felt bad   Propoxyphene N-Acetaminophen     REACTION: [place]   Rofecoxib     unk unk     Sulfamethoxazole     REACTION: unspecified   Sulfonamide Derivatives     REACTION: unspecified   Celecoxib Rash    REACTION: [place] REACTION: [place]   Codeine Rash    REACTION: [place] REACTION: [place]   Hydrocod Polst-Cpm Polst Er Rash   Propoxyphene Rash   Sulfa Antibiotics Rash    REACTION:  unspecified REACTION: unspecified    Assessment/Plan:  1. Hyperlipidemia - LDL of 110 is above goal of <55 and needs additional lipid lowering therapy. Patient is statin intolerant (hives to simvastatin and pravastatin), would not retrial another statin. Patient is not open to injectable therapy. Continue ezetimibe for now. Will submit PA for Nexlizet. Will call patient once PA is approved. Will then plan to get the patient approved for the [place] to cover cost of copay of Nexlizet. Patient informed this may cause her to go into the donut hole and cause some of her other medications cost to increase near the end of next year. Emphasized lifestyle interventions such as increasing amount of walking and making smart food choices such as lean proteins, veggies, and whole grains to help lower her cholesterol.   Patient seen with Marcelle Overlie, PharmD, BCPS, CPP.   Cathrine Muster, PharmD, [place] PGY2 Cardiology Pharmacy Resident 11/16/2021  2:16 PM

## 2021-11-16 ENCOUNTER — Ambulatory Visit: Payer: Medicare PPO

## 2021-11-16 ENCOUNTER — Other Ambulatory Visit: Payer: Self-pay

## 2021-11-16 DIAGNOSIS — I2510 Atherosclerotic heart disease of native coronary artery without angina pectoris: Secondary | ICD-10-CM

## 2021-11-16 DIAGNOSIS — E0843 Diabetes mellitus due to underlying condition with diabetic autonomic (poly)neuropathy: Secondary | ICD-10-CM

## 2021-11-16 DIAGNOSIS — E782 Mixed hyperlipidemia: Secondary | ICD-10-CM | POA: Diagnosis not present

## 2021-11-16 NOTE — Patient Instructions (Addendum)
Your last LDL was 110. Our goal for you is < 55 to lower risk of heart attack and stroke.   Continue taking your ezetimibe as prescribed. Try to eat lean proteins, veggies, and whole grains and get more exercise as this can help lower your cholesterol.   We will submit paperwork to your insurance for Nexlizet. I will also work to get you approved for the grant. We will call you once this approved.  Please call us with your social security number so we can get you approved for the grant.

## 2021-11-16 NOTE — Progress Notes (Signed)
SITUATION Reason for Visit: Fitting of Diabetic Shoes & Insoles Patient / Caregiver Report:  Patient reports these shoes are much more comfortable  OBJECTIVE DATA: Patient History / Diagnosis:     ICD-10-CM   1. Diabetes mellitus due to underlying condition with diabetic autonomic neuropathy, unspecified whether long term insulin use ([facility])  E08.43       Change in Status:   None  ACTIONS PERFORMED: In-Person Delivery, patient was fit with: - 1x pair A5500 PDAC approved prefabricated Diabetic Shoes: Apex A3200W - 3x pair [id number] PDAC approved CAM milled custom diabetic insoles  Shoes and insoles were verified for structural integrity and safety. Patient wore shoes and insoles in office. Skin was inspected and free of areas of concern after wearing shoes and inserts. Shoes and inserts fit properly. Patient / Caregiver provided with ferbal instruction and demonstration regarding donning, doffing, wear, care, proper fit, function, purpose, cleaning, and use of shoes and insoles ' and in all related precautions and risks and benefits regarding shoes and insoles. Patient / Caregiver was instructed to wear properly fitting socks with shoes at all times. Patient was also provided with verbal instruction regarding how to report any failures or malfunctions of shoes or inserts, and necessary follow up care. Patient / Caregiver was also instructed to contact physician regarding change in status that may affect function of shoes and inserts.   Patient / Caregiver verbalized undersatnding of instruction provided. Patient / Caregiver demonstrated independence with proper donning and doffing of shoes and inserts.  PLAN Patient to follow up as needed. Plan of care was discussed with and agreed upon by patient and/or caregiver. All questions were answered and concerns addressed.

## 2021-11-17 ENCOUNTER — Telehealth: Payer: Self-pay | Admitting: Cardiovascular Disease

## 2021-11-17 ENCOUNTER — Telehealth: Payer: Self-pay

## 2021-11-17 MED ORDER — NEXLIZET 180-10 MG PO TABS: 1.0000 | ORAL_TABLET | Freq: Every day | ORAL | 11 refills | Status: DC

## 2021-11-17 NOTE — Telephone Encounter (Signed)
[name] [name] is calling stating the pharmacist advised her at her appointment yesterday to call in this morning and speak with her in regards to her social security number. Please advise.

## 2021-11-17 NOTE — Telephone Encounter (Signed)
Called pharmacy and provided them the grant information. Copay $0 for Nexlizet and deactivated Zetia prescription. Called patient and informed her prescription has been sent to her pharmacy with $0 copay. Instructed her to stop her Zetia and start her Nexlizet the next day, patient verbalized understanding. Patient inquired about side effect of constipation and if she could take Metamucil. I informed her that she could take Metamucil should she experience constipation, but that I would not start taking it on a prophylactic basis as she may not experience this side effect.   Cathrine Muster, PharmD, [place] PGY2 Cardiology Pharmacy Resident 11/17/2021  11:35 AM

## 2021-11-17 NOTE — Telephone Encounter (Signed)
Nexlizet approved through 11/28/22. [employer] grant approved  CARD NO. [id number]   BIN [id number]   PCN [id number]   PC GROUP [id number]   Rx sent to [address]  [place]

## 2021-12-16 DIAGNOSIS — E1165 Type 2 diabetes mellitus with hyperglycemia: Secondary | ICD-10-CM | POA: Diagnosis not present

## 2021-12-21 DIAGNOSIS — H59811 Chorioretinal scars after surgery for detachment, right eye: Secondary | ICD-10-CM | POA: Diagnosis not present

## 2021-12-21 DIAGNOSIS — E119 Type 2 diabetes mellitus without complications: Secondary | ICD-10-CM | POA: Diagnosis not present

## 2021-12-21 DIAGNOSIS — H401133 Primary open-angle glaucoma, bilateral, severe stage: Secondary | ICD-10-CM | POA: Diagnosis not present

## 2021-12-21 DIAGNOSIS — H5213 Myopia, bilateral: Secondary | ICD-10-CM | POA: Diagnosis not present

## 2021-12-21 DIAGNOSIS — Z9841 Cataract extraction status, right eye: Secondary | ICD-10-CM | POA: Diagnosis not present

## 2021-12-21 DIAGNOSIS — Z9842 Cataract extraction status, left eye: Secondary | ICD-10-CM | POA: Diagnosis not present

## 2021-12-21 LAB — HM DIABETES EYE EXAM

## 2022-01-05 ENCOUNTER — Encounter: Payer: Self-pay | Admitting: Primary Care

## 2022-01-05 ENCOUNTER — Ambulatory Visit: Payer: Medicare PPO | Admitting: Primary Care

## 2022-01-08 ENCOUNTER — Ambulatory Visit: Payer: Medicare PPO | Admitting: Podiatry

## 2022-01-11 DIAGNOSIS — H16041 Marginal corneal ulcer, right eye: Secondary | ICD-10-CM | POA: Diagnosis not present

## 2022-01-13 DIAGNOSIS — H16041 Marginal corneal ulcer, right eye: Secondary | ICD-10-CM | POA: Diagnosis not present

## 2022-01-25 ENCOUNTER — Ambulatory Visit: Payer: Medicare PPO | Admitting: Podiatry

## 2022-01-25 ENCOUNTER — Encounter: Payer: Self-pay | Admitting: Podiatry

## 2022-01-25 ENCOUNTER — Other Ambulatory Visit: Payer: Self-pay

## 2022-01-25 DIAGNOSIS — M79675 Pain in left toe(s): Secondary | ICD-10-CM

## 2022-01-25 DIAGNOSIS — M2042 Other hammer toe(s) (acquired), left foot: Secondary | ICD-10-CM

## 2022-01-25 DIAGNOSIS — B351 Tinea unguium: Secondary | ICD-10-CM

## 2022-01-25 DIAGNOSIS — H59811 Chorioretinal scars after surgery for detachment, right eye: Secondary | ICD-10-CM | POA: Diagnosis not present

## 2022-01-25 DIAGNOSIS — M79674 Pain in right toe(s): Secondary | ICD-10-CM | POA: Diagnosis not present

## 2022-01-25 DIAGNOSIS — E0843 Diabetes mellitus due to underlying condition with diabetic autonomic (poly)neuropathy: Secondary | ICD-10-CM

## 2022-01-25 DIAGNOSIS — M2010 Hallux valgus (acquired), unspecified foot: Secondary | ICD-10-CM

## 2022-01-25 DIAGNOSIS — H43311 Vitreous membranes and strands, right eye: Secondary | ICD-10-CM | POA: Diagnosis not present

## 2022-01-25 DIAGNOSIS — M2041 Other hammer toe(s) (acquired), right foot: Secondary | ICD-10-CM

## 2022-01-25 NOTE — Progress Notes (Signed)
This patient returns to my office for at risk foot care.  This patient requires this care by a professional since this patient will be at risk due to having diabetes.This patient is unable to cut nails herself since the patient cannot reach her nails.These nails are painful walking and wearing shoes.  This patient presents for at risk foot care today.  General Appearance  Alert, conversant and in no acute stress.  Vascular  Dorsalis pedis and posterior tibial  pulses are  weakly palpable  bilaterally.  Capillary return is within normal limits  bilaterally. Temperature is within normal limits  bilaterally.  Neurologic  Senn-Weinstein monofilament wire test within normal limits  bilaterally. Muscle power within normal limits bilaterally.  Nails Thick disfigured discolored nails with subungual debris  from hallux to fifth toes bilaterally. No evidence of bacterial infection or drainage bilaterally.  Orthopedic  No limitations of motion  feet .  No crepitus or effusions noted.  No bony pathology or digital deformities noted.  Skin  normotropic skin with no porokeratosis noted bilaterally.  No signs of infections or ulcers noted.     Onychomycosis  Pain in right toes  Pain in left toes  Consent was obtained for treatment procedures.   Mechanical debridement of nails 1-5  bilaterally performed with a nail nipper.  Filed with dremel without incident.    Return office visit   3 months                   Told patient to return for periodic foot care and evaluation due to potential at risk complications.   Masiyah Jorstad DPM  

## 2022-01-26 ENCOUNTER — Telehealth: Payer: Self-pay

## 2022-01-26 DIAGNOSIS — E782 Mixed hyperlipidemia: Secondary | ICD-10-CM

## 2022-01-26 NOTE — Telephone Encounter (Signed)
Lmom the pt to call back to schedule labs.

## 2022-01-26 NOTE — Telephone Encounter (Signed)
-----   Message from Ramond Dial, [place] sent at 01/26/2022  3:46 PM EST -----  ----- Message ----- From: Ramond Dial, RPH-CPP Sent: 01/26/2022  12:00 AM EST To: Ramond Dial, RPH-CPP  Set up lipids

## 2022-01-29 DIAGNOSIS — H31092 Other chorioretinal scars, left eye: Secondary | ICD-10-CM | POA: Diagnosis not present

## 2022-01-29 DIAGNOSIS — H43813 Vitreous degeneration, bilateral: Secondary | ICD-10-CM | POA: Diagnosis not present

## 2022-01-29 DIAGNOSIS — H43393 Other vitreous opacities, bilateral: Secondary | ICD-10-CM | POA: Diagnosis not present

## 2022-01-29 DIAGNOSIS — H59811 Chorioretinal scars after surgery for detachment, right eye: Secondary | ICD-10-CM | POA: Diagnosis not present

## 2022-02-01 ENCOUNTER — Other Ambulatory Visit: Payer: Self-pay

## 2022-02-01 ENCOUNTER — Other Ambulatory Visit: Payer: Medicare PPO | Admitting: *Deleted

## 2022-02-01 DIAGNOSIS — E782 Mixed hyperlipidemia: Secondary | ICD-10-CM

## 2022-02-01 LAB — LIPID PANEL
Chol/HDL Ratio: 3.7 ratio (ref 0.0–4.4)
Cholesterol, Total: 123 mg/dL (ref 100–199)
HDL: 33 mg/dL — ABNORMAL LOW (ref >39)
LDL Chol Calc (NIH): 54 mg/dL (ref 0–99)
Triglycerides: 226 mg/dL — ABNORMAL HIGH (ref 0–149)
VLDL Cholesterol Cal: 36 mg/dL (ref 5–40)

## 2022-02-02 ENCOUNTER — Telehealth: Payer: Self-pay | Admitting: Pharmacist

## 2022-02-02 NOTE — Telephone Encounter (Signed)
Left VM for pt to call back to discuss lipids. ?LDL-C at goal. Continue Nexlizet. ?TG are high. Watch sugars/carbs ?

## 2022-02-05 ENCOUNTER — Encounter: Payer: Self-pay | Admitting: Primary Care

## 2022-02-05 ENCOUNTER — Other Ambulatory Visit: Payer: Self-pay

## 2022-02-05 ENCOUNTER — Ambulatory Visit (INDEPENDENT_AMBULATORY_CARE_PROVIDER_SITE_OTHER): Payer: Medicare PPO | Admitting: Primary Care

## 2022-02-05 VITALS — BP 120/58 | HR 71 | Temp 98.0°F | Resp 16 | Ht 69.0 in | Wt 168.6 lb

## 2022-02-05 DIAGNOSIS — N3941 Urge incontinence: Secondary | ICD-10-CM | POA: Diagnosis not present

## 2022-02-05 DIAGNOSIS — R32 Unspecified urinary incontinence: Secondary | ICD-10-CM | POA: Insufficient documentation

## 2022-02-05 DIAGNOSIS — E1122 Type 2 diabetes mellitus with diabetic chronic kidney disease: Secondary | ICD-10-CM

## 2022-02-05 DIAGNOSIS — R350 Frequency of micturition: Secondary | ICD-10-CM

## 2022-02-05 DIAGNOSIS — N1832 Chronic kidney disease, stage 3b: Secondary | ICD-10-CM | POA: Diagnosis not present

## 2022-02-05 LAB — POC URINALSYSI DIPSTICK (AUTOMATED)
Bilirubin, UA: NEGATIVE
Blood, UA: NEGATIVE
Glucose, UA: NEGATIVE
Ketones, UA: 5
Leukocytes, UA: NEGATIVE
Nitrite, UA: NEGATIVE
Protein, UA: POSITIVE — AB
Spec Grav, UA: 1.025 (ref 1.010–1.025)
Urobilinogen, UA: 0.2 E.U./dL
pH, UA: 6 (ref 5.0–8.0)

## 2022-02-05 LAB — POCT GLYCOSYLATED HEMOGLOBIN (HGB A1C): Hemoglobin A1C: 7.2 % — AB (ref 4.0–5.6)

## 2022-02-05 MED ORDER — OXYBUTYNIN CHLORIDE ER 5 MG PO TB24: 5.0000 mg | ORAL_TABLET | Freq: Every day | ORAL | 0 refills | Status: DC

## 2022-02-05 NOTE — Assessment & Plan Note (Signed)
Controlled with A1c of 7.2 today.  ? ?Continue Glipizide XL 10 mg daily. ? ?Foot and eye exams UTD. ?Intolerant to statin therapy. ?Urine microalbumin due and pending. ?Pneumonia vaccine UTD. ? ?Follow up in 6 months.  ?

## 2022-02-05 NOTE — Assessment & Plan Note (Addendum)
New issue, no prior symptoms. ? ?UA today negative.  ?A1c controlled. ?Not on a diuretic. ? ?Prescription for oxybutynin XL 5 mg sent to pharmacy.  She will update in a few weeks. ? ? ? ? ?

## 2022-02-05 NOTE — Patient Instructions (Signed)
Continue glipizide XL 10 mg daily for diabetes. ? ?Have [name] [name] help you set up the freestyle libre 2 ? ?Schedule a physical for 6 months from now. ? ?It was a pleasure to see you today! ? ?

## 2022-02-05 NOTE — Progress Notes (Signed)
Subjective:    Patient ID: [name] [name], female    DOB: 04-18-1932, 88 y.o.   MRN: [id number]  HPI  [name] [name] is a very pleasant 88 y.o. female with a history of hypertension, type 2 diabetes, CAD, hyperlipidemia, who presents today for follow up of diabetes. She would also like to discuss urinary incontinence.   1) Type 2 Diabetes:  Current medications include: Glipizide XL 10 mg daily,  She is checking her blood glucose 1 times daily and is getting readings of:  AM fasting: 170's  Last A1C: 7.4 in November 2022, 7.2 today Last Eye Exam: UTD Last Foot Exam: UTD Pneumonia Vaccination: UTD Urine Microalbumin: Due Statin: None.  Intolerant.  Dietary changes since last visit: Endorses a healthy diet with salads and vegetables.    Exercise: No regular exercise.    BP Readings from Last 3 Encounters:  02/05/22 (!) 160/100  10/07/21 112/62  10/05/21 (!) 144/72   She brings her [employer] 2 today and is needing instructions for use. She has never used this before.   Following with podiatry who recently prescribed gabapentin for neuropathy.  2) Urinary Incontinence: History of urge incontinence for the last three months. She will feel the need to urinate, will get up, lose control of her urine. History of recurrent UTI's.   She denies dysuria which is typically present during her UTIs. She does get up at night every 2 hours to urinate, this began two months ago. She has increased her intake of water during the day. She does not drink caffeine. She drinks liquids 2 hours prior to bedtime.   She's never had incontinence previously. History of hysterectomy.     Review of Systems  Eyes:  Positive for visual disturbance.  Respiratory:  Negative for shortness of breath.   Cardiovascular:  Negative for chest pain.  Gastrointestinal:  Negative for abdominal pain.  Genitourinary:  Positive for frequency and urgency. Negative for dysuria and vaginal discharge.   Neurological:  Positive for numbness.        Past Medical History:  Diagnosis Date   Acute bilateral thoracic back pain 08/16/2019   Allergy    Cipro, Plavix, Statins   CAD (coronary artery disease)    a. s/p tandem Promus DES to LAD in 2009 by Dr. Olevia Perches;  b.  LHC (03/22/14):  prox LAD 30%, mid LAD 40%, LAD stent ok with dist 20% ISR, apical LAD occluded with L-L collats filling apical vessel (too small for PCI), mid RCA 20%, EF 70%.  Med Rx   Diabetes mellitus    Diagnosed 2012   Diverticulosis of colon (without mention of hemorrhage)    GERD (gastroesophageal reflux disease)    Herpes zoster without complication 03/07/1447   Hx of cardiovascular stress test    a. Nuclear (08/2013):  No ischemia, EF 80%, Normal   Hx of echocardiogram    a. Echo (07/2013):  Mild LVH, vigorous LVF, EF 65-70%, Gr 1 DD, mild MR, mild to mod LAE   Hyperlipidemia    intol of statins   Hypertension    NHL (non-Hodgkin's lymphoma) ([place]) dx'd 2003   chemo/xrt comp 2003   Personal history of colonic polyps    Proctitis    Pruritic intertrigo 07/18/2014   Urticaria     Social History   Socioeconomic History   Marital status: Widowed    Spouse name: Not on file   Number of children: Not on file   Years of education: Not on  file   Highest education level: Not on file  Occupational History   Occupation: Retired    Fish farm manager: RETIRED  Tobacco Use   Smoking status: Never   Smokeless tobacco: Never  Vaping Use   Vaping Use: Never used  Substance and Sexual Activity   Alcohol use: No   Drug use: No   Sexual activity: Not Currently  Other Topics Concern   Not on file  Social History Narrative   Not on file   Social Determinants of Health   Financial Resource Strain: Low Risk    Difficulty of Paying Living Expenses: Not hard at all  Food Insecurity: No Food Insecurity   Worried About Charity fundraiser in the Last Year: Never true   [place] in the Last Year: Never true   Transportation Needs: No Transportation Needs   Lack of Transportation (Medical): No   Lack of Transportation (Non-Medical): No  Physical Activity: Inactive   Days of Exercise per Week: 0 days   Minutes of Exercise per Session: 0 min  Stress: No Stress Concern Present   Feeling of Stress : Not at all  Social Connections: Not on file  Intimate Partner Violence: Not At Risk   Fear of Current or Ex-Partner: No   Emotionally Abused: No   Physically Abused: No   Sexually Abused: No    Past Surgical History:  Procedure Laterality Date   cardiac cath-neg     CORONARY ANGIOPLASTY WITH STENT PLACEMENT     x 2   dexa-neg     KNEE SURGERY  10/2003   left   laser surgery for cataracts-left     LEFT HEART CATHETERIZATION WITH CORONARY ANGIOGRAM N/A 03/22/2014   Procedure: LEFT HEART CATHETERIZATION WITH CORONARY ANGIOGRAM;  Surgeon: Burnell Blanks, MD;  Location: [facility] CATH LAB;  Service: Cardiovascular;  Laterality: N/A;   stress cardiolite     VAGINAL HYSTERECTOMY     partial, fibroids, one ovary left    Family History  Problem Relation Age of Onset   Cancer Mother        jaw   Hypertension Father    Heart attack Father    Heart attack Sister    Colon cancer Neg Hx     Allergies  Allergen Reactions   Amoxicillin Hives   Other Rash and Anaphylaxis   Shellfish Allergy Anaphylaxis and Rash   Cephalexin     REACTION: trash and swelling REACTION: trash and swelling   Ciprofloxacin     REACTION: u/k   Clopidogrel Bisulfate     REACTION: swelling, rash   Codeine Phosphate     REACTION: unspecified   Ezetimibe-Simvastatin     REACTION: myalgias REACTION: myalgias   Hydrocod Poli-Chlorphe Poli Er     REACTION: u/k   Lidocaine Hives    ONLY TO ADHESIVE LIDOCAINE PATCHES. NO PROBLEM WITH INJECTABLE LIDOCAINE.   Nitrofurantoin     REACTION: [place] REACTION: [place]   Pregabalin     REACTION: felt bad REACTION: felt bad   Propoxyphene N-Acetaminophen     REACTION: [place]    Rofecoxib     unk unk    Sulfamethoxazole     REACTION: unspecified   Sulfonamide Derivatives     REACTION: unspecified   Celecoxib Rash    REACTION: [place] REACTION: [place]   Codeine Rash    REACTION: [place] REACTION: [place]   Hydrocod Poli-Chlorphe Poli Er Rash   Propoxyphene Rash   Sulfa Antibiotics Rash  REACTION: unspecified REACTION: unspecified    Current Outpatient Medications on File Prior to Visit  Medication Sig Dispense Refill   aspirin 81 MG tablet Take 81 mg by mouth daily.     Blood Glucose Calibration (TRUE METRIX LEVEL 1) Low SOLN Use to calibrate meter 3 each 0   Blood Glucose Monitoring Suppl (TRUE METRIX METER) DEVI Test blood sugars once or twice daily for diabetes. 1 each 0   brimonidine-timolol (COMBIGAN) 0.2-0.5 % ophthalmic solution Place 1 drop into both eyes every 12 (twelve) hours.     Cholecalciferol (VITAMIN D-3) 1000 UNITS CAPS Take 1 capsule by mouth daily.     Continuous Blood Gluc Receiver (FREESTYLE LIBRE 2 READER) DEVI Use to check blood sugars. 1 each 0   Continuous Blood Gluc Sensor (FREESTYLE LIBRE 2 SENSOR) MISC Apply every 14 days to check blood sugars. 6 each 1   fexofenadine (ALLEGRA) 180 MG tablet Take 1 tablet (180 mg total) by mouth as needed for allergies or rhinitis. 90 tablet 0   gabapentin (NEURONTIN) 100 MG capsule Take 100 mg by mouth 2 (two) times daily.     glipiZIDE (GLUCOTROL XL) 10 MG 24 hr tablet TAKE 1 TABLET EVERY MORNING WITH BREAKFAST FOR DIABETES 90 tablet 0   glucose blood (TRUE METRIX BLOOD GLUCOSE TEST) test strip TEST BLOOD SUGAR ONCE TO TWICE DAILY FOR DIABETES. 200 strip 1   isosorbide mononitrate (IMDUR) 60 MG 24 hr tablet Take 1.5 tablets (90 mg total) by mouth daily. 135 tablet 3   metoprolol succinate (TOPROL-XL) 100 MG 24 hr tablet TAKE 1 TABLET EVERY MORNING WITH OR IMMEDIATELY FOLLOWING A MEAL (NEED MD APPOINTMENT) 90 tablet 0   nitroGLYCERIN (NITROSTAT) 0.4 MG SL tablet Place 1 tablet (0.4 mg total) under the tongue  every 5 (five) minutes as needed for chest pain. 26 tablet 0   pantoprazole (PROTONIX) 20 MG tablet Take 1 tablet (20 mg total) by mouth daily. For heartburn. 90 tablet 3   sertraline (ZOLOFT) 100 MG tablet Take 1 tablet (100 mg total) by mouth daily. For anxiety and depression 90 tablet 1   timolol (TIMOPTIC) 0.5 % ophthalmic solution      TRUEplus Lancets 33G MISC TEST BLOOD SUGAR ONCE TO TWICE DAILY FOR DIABETES. 200 each 3   vitamin B-12 (CYANOCOBALAMIN) 1000 MCG tablet Take 1,000 mcg by mouth daily.     Bempedoic Acid-Ezetimibe (NEXLIZET) 180-10 MG TABS Take 1 tablet by mouth daily. (Patient not taking: Reported on 02/05/2022) 30 tablet 11   [DISCONTINUED] loratadine (CLARITIN) 10 MG tablet Take 10 mg by mouth as needed.      No current facility-administered medications on file prior to visit.    BP (!) 160/100 (BP Location: Left Arm, Patient Position: Sitting, Cuff Size: Normal) Comment: Unsure if she took meds this AM   Pulse 72    Temp 98 F (36.7 C) (Oral)    Resp 16    Ht '5\' 9"'$  (1.753 m)    Wt 168 lb 9.6 oz (76.5 kg)    SpO2 96%    BMI 24.90 kg/m  Objective:   Physical Exam Cardiovascular:     Rate and Rhythm: Normal rate and regular rhythm.  Pulmonary:     Effort: Pulmonary effort is normal.     Breath sounds: Normal breath sounds.  Musculoskeletal:     Cervical back: Neck supple.  Skin:    General: Skin is warm and dry.          Assessment &  Plan:      This visit occurred during the SARS-CoV-2 public health emergency.  Safety protocols were in place, including screening questions prior to the visit, additional usage of staff PPE, and extensive cleaning of exam room while observing appropriate contact time as indicated for disinfecting solutions.

## 2022-02-11 DIAGNOSIS — E1165 Type 2 diabetes mellitus with hyperglycemia: Secondary | ICD-10-CM | POA: Diagnosis not present

## 2022-02-12 DIAGNOSIS — H903 Sensorineural hearing loss, bilateral: Secondary | ICD-10-CM | POA: Diagnosis not present

## 2022-02-23 NOTE — Telephone Encounter (Signed)
Patient saw results in mychart ?

## 2022-03-02 DIAGNOSIS — H16211 Exposure keratoconjunctivitis, right eye: Secondary | ICD-10-CM | POA: Diagnosis not present

## 2022-03-08 ENCOUNTER — Other Ambulatory Visit: Payer: Self-pay | Admitting: Primary Care

## 2022-03-08 DIAGNOSIS — N3941 Urge incontinence: Secondary | ICD-10-CM

## 2022-03-12 LAB — HM MAMMOGRAPHY

## 2022-03-15 DIAGNOSIS — D485 Neoplasm of uncertain behavior of skin: Secondary | ICD-10-CM | POA: Diagnosis not present

## 2022-03-15 DIAGNOSIS — L821 Other seborrheic keratosis: Secondary | ICD-10-CM | POA: Diagnosis not present

## 2022-03-15 DIAGNOSIS — C44629 Squamous cell carcinoma of skin of left upper limb, including shoulder: Secondary | ICD-10-CM | POA: Diagnosis not present

## 2022-03-15 DIAGNOSIS — E1165 Type 2 diabetes mellitus with hyperglycemia: Secondary | ICD-10-CM | POA: Diagnosis not present

## 2022-03-22 DIAGNOSIS — K219 Gastro-esophageal reflux disease without esophagitis: Secondary | ICD-10-CM

## 2022-03-25 MED ORDER — PANTOPRAZOLE SODIUM 20 MG PO TBEC: 20.0000 mg | DELAYED_RELEASE_TABLET | Freq: Every day | ORAL | 0 refills | Status: DC

## 2022-04-05 DIAGNOSIS — D0462 Carcinoma in situ of skin of left upper limb, including shoulder: Secondary | ICD-10-CM | POA: Diagnosis not present

## 2022-04-05 DIAGNOSIS — C44629 Squamous cell carcinoma of skin of left upper limb, including shoulder: Secondary | ICD-10-CM | POA: Diagnosis not present

## 2022-04-07 ENCOUNTER — Ambulatory Visit (HOSPITAL_COMMUNITY): Payer: Medicare PPO | Attending: Cardiology

## 2022-04-07 DIAGNOSIS — I2510 Atherosclerotic heart disease of native coronary artery without angina pectoris: Secondary | ICD-10-CM

## 2022-04-07 DIAGNOSIS — I10 Essential (primary) hypertension: Secondary | ICD-10-CM | POA: Diagnosis not present

## 2022-04-07 DIAGNOSIS — I351 Nonrheumatic aortic (valve) insufficiency: Secondary | ICD-10-CM | POA: Diagnosis not present

## 2022-04-07 DIAGNOSIS — E782 Mixed hyperlipidemia: Secondary | ICD-10-CM | POA: Insufficient documentation

## 2022-04-07 LAB — ECHOCARDIOGRAM COMPLETE
Area-P 1/2: 2.55 cm2
P 1/2 time: 661 msec
S' Lateral: 3.7 cm

## 2022-04-12 ENCOUNTER — Ambulatory Visit: Payer: Medicare PPO | Admitting: Family Medicine

## 2022-04-12 ENCOUNTER — Encounter: Payer: Self-pay | Admitting: Family Medicine

## 2022-04-12 VITALS — BP 140/70 | HR 74 | Temp 98.3°F | Ht 69.0 in | Wt 165.4 lb

## 2022-04-12 DIAGNOSIS — R051 Acute cough: Secondary | ICD-10-CM

## 2022-04-12 DIAGNOSIS — J189 Pneumonia, unspecified organism: Secondary | ICD-10-CM

## 2022-04-12 LAB — POC COVID19 BINAXNOW: SARS Coronavirus 2 Ag: NEGATIVE

## 2022-04-12 MED ORDER — DOXYCYCLINE HYCLATE 100 MG PO TABS: 100.0000 mg | ORAL_TABLET | Freq: Two times a day (BID) | ORAL | 0 refills | Status: DC

## 2022-04-12 NOTE — Progress Notes (Signed)
? ? ?[name] Macgregor, MD, [facility] ?Therapist, music at [facility] ?[place] ?[place], [address] ? ?Phone: [phone]  FAX: [phone] ? ?[name] [name] - 88 y.o. female  MRN [id number]  Date of Birth: 1932-08-25 ? ?Date: 04/12/2022  PCP: Pleas Koch, NP  Referral: Pleas Koch, NP ? ?Chief Complaint  ?Patient presents with  ? Cough  ?  With green phlegm x 3 days  ? ?Subjective:  ? ?[name] [name] is a 88 y.o. very pleasant female patient with Body mass index is 24.42 kg/m?. who presents with the following: ? ?Patient feels unwell with fairly dramatic coughing and sputum production x3 days.  She also has a sore throat, but she denies significant nausea, vomiting, diarrhea.  She does have some mild nasal congestion.  No significant sinus pain, ear pain. ? ?Does not have a known history of exposure to COVID-19 or influenza. ? ?She has tried some basic home remedies without much significant success. ? ?Cough and throat are hurting a lot. ?Spitting up a lot of green. ? ?Check covid ? ?Review of Systems is noted in the HPI, as appropriate ? ?Objective:  ? ?BP 140/70   Pulse 74   Temp 98.3 ?F (36.8 ?C) (Oral)   Ht '5\' 9"'$  (1.753 m)   Wt 165 lb 6 oz (75 kg)   SpO2 96%   BMI 24.42 kg/m?  ? ? ?Gen: WDWN, cooperative. Globally Non-toxic ?HEENT: Normocephalic and atraumatic. Throat clear, w/o exudate, R TM clear, L TM - good landmarks, No fluid present. Mild rhinnorhea. No frontal or maxillary sinus T. MMM ?NECK: Anterior cervical  LAD is present ?CV: RRR, No M/G/R, cap refill <2 sec ?PULM: Breathing comfortably in no respiratory distress.  She does have diffuse rhonchorous sounds in bilateral lung fields without frank wheezing.   ?ABD: S,NT,ND,+BS. No HSM. No rebound. ?MSK: Nml gait  ? ?Laboratory and Imaging Data: ?Results for orders placed or performed in visit on 04/12/22  ?[place] COVID-19  ?Result Value Ref Range  ? SARS Coronavirus 2 Ag Negative Negative  ?   ? ?Assessment and Plan:  ? ?  ICD-10-CM   ?1. Community acquired pneumonia, unspecified laterality  J18.9   ?  ?2. Acute cough  R05.1 POC COVID-19  ?  ? ?Concern from pneumonia in a almost 88 year old patient.  Diffuse bilateral rhonchi.  No history of smoking or pulmonary disease, but high risk patient. ? ?I am going to have the patient start some doxycycline for coverage.  Otherwise, continue with supportive care. ? ?Medication Management during today's office visit: ?Meds ordered this encounter  ?Medications  ? doxycycline (VIBRA-TABS) 100 MG tablet  ?  Sig: Take 1 tablet (100 mg total) by mouth 2 (two) times daily.  ?  Dispense:  20 tablet  ?  Refill:  0  ? ?There are no discontinued medications. ? ?Orders placed today for conditions managed today: ?Orders Placed This Encounter  ?Procedures  ? [place] COVID-19  ? ? ?Follow-up if needed: No follow-ups on file. ? ?Dragon [employer] One speech-to-text software was used for transcription in this dictation.  Possible transcriptional errors can occur using Editor, commissioning.  ? ?Signed, ? ?[name] T. [name], MD ? ? ?Outpatient Encounter Medications as of 04/12/2022  ?Medication Sig  ? aspirin 81 MG tablet Take 81 mg by mouth daily.  ? Bempedoic Acid-Ezetimibe (NEXLIZET) 180-10 MG TABS Take 1 tablet by mouth daily.  ? Blood Glucose Calibration (TRUE METRIX LEVEL 1)  Low SOLN Use to calibrate meter  ? Blood Glucose Monitoring Suppl (TRUE METRIX METER) DEVI Test blood sugars once or twice daily for diabetes.  ? brimonidine-timolol (COMBIGAN) 0.2-0.5 % ophthalmic solution Place 1 drop into both eyes every 12 (twelve) hours.  ? Cholecalciferol (VITAMIN D-3) 1000 UNITS CAPS Take 1 capsule by mouth daily.  ? Continuous Blood Gluc Receiver (FREESTYLE LIBRE 2 READER) DEVI Use to check blood sugars.  ? Continuous Blood Gluc Sensor (FREESTYLE LIBRE 2 SENSOR) MISC Apply every 14 days to check blood sugars.  ? doxycycline (VIBRA-TABS) 100 MG tablet Take 1 tablet (100 mg total) by mouth 2  (two) times daily.  ? ezetimibe (ZETIA) 10 MG tablet Take 10 mg by mouth daily.  ? fexofenadine (ALLEGRA) 180 MG tablet Take 1 tablet (180 mg total) by mouth as needed for allergies or rhinitis.  ? gabapentin (NEURONTIN) 100 MG capsule Take 100 mg by mouth 2 (two) times daily.  ? glipiZIDE (GLUCOTROL XL) 10 MG 24 hr tablet TAKE 1 TABLET EVERY MORNING WITH BREAKFAST FOR DIABETES  ? glucose blood (TRUE METRIX BLOOD GLUCOSE TEST) test strip TEST BLOOD SUGAR ONCE TO TWICE DAILY FOR DIABETES.  ? isosorbide mononitrate (IMDUR) 60 MG 24 hr tablet Take 1.5 tablets (90 mg total) by mouth daily.  ? metoprolol succinate (TOPROL-XL) 100 MG 24 hr tablet TAKE 1 TABLET EVERY MORNING WITH OR IMMEDIATELY FOLLOWING A MEAL (NEED MD APPOINTMENT)  ? nitroGLYCERIN (NITROSTAT) 0.4 MG SL tablet Place 1 tablet (0.4 mg total) under the tongue every 5 (five) minutes as needed for chest pain.  ? oxybutynin (DITROPAN-XL) 5 MG 24 hr tablet TAKE ONE TABLET BY MOUTH EVERY NIGHT AT BEDTIME FOR OVERACTIVE BLADDER  ? pantoprazole (PROTONIX) 20 MG tablet Take 1 tablet (20 mg total) by mouth daily. For heartburn.  ? sertraline (ZOLOFT) 100 MG tablet Take 1 tablet (100 mg total) by mouth daily. For anxiety and depression  ? timolol (TIMOPTIC) 0.5 % ophthalmic solution   ? TRUEplus Lancets 33G MISC TEST BLOOD SUGAR ONCE TO TWICE DAILY FOR DIABETES.  ? vitamin B-12 (CYANOCOBALAMIN) 1000 MCG tablet Take 1,000 mcg by mouth daily.  ? [DISCONTINUED] loratadine (CLARITIN) 10 MG tablet Take 10 mg by mouth as needed.   ? ?No facility-administered encounter medications on file as of 04/12/2022.  ?  ?

## 2022-04-13 ENCOUNTER — Telehealth: Payer: Self-pay | Admitting: Primary Care

## 2022-04-13 NOTE — Telephone Encounter (Signed)
Sophia from [facility] called about paperwork called [employer]. They said they just need the office notes faxed over and sent back over to them. ? ?They did not give me a fax number. ?Callback Number: [phone] ?

## 2022-04-14 DIAGNOSIS — E1165 Type 2 diabetes mellitus with hyperglycemia: Secondary | ICD-10-CM | POA: Diagnosis not present

## 2022-04-14 NOTE — Telephone Encounter (Signed)
Yes, I completed this paperwork on Tuesday or Wednesday of this week and placed in your inbox. ?

## 2022-04-14 NOTE — Telephone Encounter (Signed)
Have you seen ppw? 

## 2022-04-14 NOTE — Telephone Encounter (Signed)
Ppw faxed on 5/16. I have faxed office notes that are requested.  ?

## 2022-04-19 ENCOUNTER — Other Ambulatory Visit: Payer: Self-pay | Admitting: Primary Care

## 2022-04-19 ENCOUNTER — Encounter: Payer: Self-pay | Admitting: Podiatry

## 2022-04-19 ENCOUNTER — Ambulatory Visit: Payer: Medicare PPO | Admitting: Podiatry

## 2022-04-19 DIAGNOSIS — M79674 Pain in right toe(s): Secondary | ICD-10-CM | POA: Diagnosis not present

## 2022-04-19 DIAGNOSIS — M2010 Hallux valgus (acquired), unspecified foot: Secondary | ICD-10-CM

## 2022-04-19 DIAGNOSIS — B351 Tinea unguium: Secondary | ICD-10-CM

## 2022-04-19 DIAGNOSIS — M2041 Other hammer toe(s) (acquired), right foot: Secondary | ICD-10-CM

## 2022-04-19 DIAGNOSIS — E0843 Diabetes mellitus due to underlying condition with diabetic autonomic (poly)neuropathy: Secondary | ICD-10-CM

## 2022-04-19 DIAGNOSIS — M79675 Pain in left toe(s): Secondary | ICD-10-CM

## 2022-04-19 DIAGNOSIS — M2042 Other hammer toe(s) (acquired), left foot: Secondary | ICD-10-CM

## 2022-04-19 DIAGNOSIS — Z6379 Other stressful life events affecting family and household: Secondary | ICD-10-CM

## 2022-04-19 NOTE — Progress Notes (Signed)
This patient returns to my office for at risk foot care.  This patient requires this care by a professional since this patient will be at risk due to having diabetes.This patient is unable to cut nails herself since the patient cannot reach her nails.These nails are painful walking and wearing shoes.  This patient presents for at risk foot care today.  General Appearance  Alert, conversant and in no acute stress.  Vascular  Dorsalis pedis and posterior tibial  pulses are  weakly palpable  bilaterally.  Capillary return is within normal limits  bilaterally. Temperature is within normal limits  bilaterally.  Neurologic  Senn-Weinstein monofilament wire test within normal limits  bilaterally. Muscle power within normal limits bilaterally.  Nails Thick disfigured discolored nails with subungual debris  from hallux to fifth toes bilaterally. No evidence of bacterial infection or drainage bilaterally.  Orthopedic  No limitations of motion  feet .  No crepitus or effusions noted.  No bony pathology or digital deformities noted.  Skin  normotropic skin with no porokeratosis noted bilaterally.  No signs of infections or ulcers noted.     Onychomycosis  Pain in right toes  Pain in left toes  Consent was obtained for treatment procedures.   Mechanical debridement of nails 1-5  bilaterally performed with a nail nipper.  Filed with dremel without incident.    Return office visit   3 months                   Told patient to return for periodic foot care and evaluation due to potential at risk complications.   Phil Michels DPM  

## 2022-04-30 DIAGNOSIS — Z83511 Family history of glaucoma: Secondary | ICD-10-CM | POA: Diagnosis not present

## 2022-04-30 DIAGNOSIS — H401133 Primary open-angle glaucoma, bilateral, severe stage: Secondary | ICD-10-CM | POA: Diagnosis not present

## 2022-05-10 DIAGNOSIS — E1165 Type 2 diabetes mellitus with hyperglycemia: Secondary | ICD-10-CM | POA: Diagnosis not present

## 2022-05-12 DIAGNOSIS — Z1231 Encounter for screening mammogram for malignant neoplasm of breast: Secondary | ICD-10-CM | POA: Diagnosis not present

## 2022-05-17 ENCOUNTER — Encounter: Payer: Self-pay | Admitting: Primary Care

## 2022-05-17 DIAGNOSIS — H401133 Primary open-angle glaucoma, bilateral, severe stage: Secondary | ICD-10-CM | POA: Diagnosis not present

## 2022-05-17 DIAGNOSIS — Z0100 Encounter for examination of eyes and vision without abnormal findings: Secondary | ICD-10-CM | POA: Diagnosis not present

## 2022-05-17 LAB — HM DIABETES EYE EXAM

## 2022-05-19 ENCOUNTER — Encounter: Payer: Self-pay | Admitting: Primary Care

## 2022-05-19 DIAGNOSIS — R928 Other abnormal and inconclusive findings on diagnostic imaging of breast: Secondary | ICD-10-CM | POA: Diagnosis not present

## 2022-05-19 DIAGNOSIS — R922 Inconclusive mammogram: Secondary | ICD-10-CM | POA: Diagnosis not present

## 2022-05-19 LAB — HM MAMMOGRAPHY

## 2022-05-24 ENCOUNTER — Telehealth: Payer: Self-pay

## 2022-05-24 DIAGNOSIS — E0821 Diabetes mellitus due to underlying condition with diabetic nephropathy: Secondary | ICD-10-CM

## 2022-05-24 MED ORDER — GLIPIZIDE ER 10 MG PO TB24: ORAL_TABLET | ORAL | 0 refills | Status: DC

## 2022-06-07 ENCOUNTER — Other Ambulatory Visit: Payer: Self-pay | Admitting: Primary Care

## 2022-06-07 DIAGNOSIS — N3941 Urge incontinence: Secondary | ICD-10-CM

## 2022-06-10 DIAGNOSIS — E1122 Type 2 diabetes mellitus with diabetic chronic kidney disease: Secondary | ICD-10-CM | POA: Diagnosis not present

## 2022-06-14 DIAGNOSIS — H401123 Primary open-angle glaucoma, left eye, severe stage: Secondary | ICD-10-CM | POA: Diagnosis not present

## 2022-06-18 ENCOUNTER — Encounter: Payer: Self-pay | Admitting: Podiatry

## 2022-06-18 ENCOUNTER — Ambulatory Visit: Payer: Medicare PPO | Admitting: Podiatry

## 2022-06-18 DIAGNOSIS — M7751 Other enthesopathy of right foot: Secondary | ICD-10-CM

## 2022-06-18 DIAGNOSIS — M79674 Pain in right toe(s): Secondary | ICD-10-CM

## 2022-06-18 DIAGNOSIS — M7752 Other enthesopathy of left foot: Secondary | ICD-10-CM

## 2022-06-18 DIAGNOSIS — M79675 Pain in left toe(s): Secondary | ICD-10-CM | POA: Diagnosis not present

## 2022-06-18 DIAGNOSIS — B351 Tinea unguium: Secondary | ICD-10-CM

## 2022-06-18 MED ORDER — BETAMETHASONE SOD PHOS & ACET 6 (3-3) MG/ML IJ SUSP
3.0000 mg | Freq: Once | INTRAMUSCULAR | Status: AC
  Administered 2022-06-18: 3 mg via INTRA_ARTICULAR

## 2022-06-18 NOTE — Progress Notes (Signed)
Chief Complaint  Patient presents with   Nail Problem    "I think my toenails are causing me to have pain."   Numbness    "They feel numb and tingly."    SUBJECTIVE Patient with a history of diabetes mellitus presents to office today complaining of elongated, thickened nails that cause pain while ambulating in shoes.  Patient is unable to trim their own nails.  Patient states that injection she received last time to the second toe joint helped significantly for several months.  She is experiencing pain to both of the second toes bilaterally today.  She is requesting injection today.   Past Medical History:  Diagnosis Date   Acute bilateral thoracic back pain 08/16/2019   Allergy    Cipro, Plavix, Statins   CAD (coronary artery disease)    a. s/p tandem Promus DES to LAD in 2009 by Dr. Olevia Perches;  b.  LHC (03/22/14):  prox LAD 30%, mid LAD 40%, LAD stent ok with dist 20% ISR, apical LAD occluded with L-L collats filling apical vessel (too small for PCI), mid RCA 20%, EF 70%.  Med Rx   Diabetes mellitus    Diagnosed 2012   Diverticulosis of colon (without mention of hemorrhage)    GERD (gastroesophageal reflux disease)    Herpes zoster without complication 9/92/4268   Hx of cardiovascular stress test    a. Nuclear (08/2013):  No ischemia, EF 80%, Normal   Hx of echocardiogram    a. Echo (07/2013):  Mild LVH, vigorous LVF, EF 65-70%, Gr 1 DD, mild MR, mild to mod LAE   Hyperlipidemia    intol of statins   Hypertension    NHL (non-Hodgkin's lymphoma) ([place]) dx'd 2003   chemo/xrt comp 2003   Personal history of colonic polyps    Proctitis    Pruritic intertrigo 07/18/2014   Urticaria     OBJECTIVE General Patient is awake, alert, and oriented x 3 and in no acute distress. Derm Skin is dry and supple bilateral. Negative open lesions or macerations. Remaining integument unremarkable. Nails are tender, long, thickened and dystrophic with subungual debris, consistent with onychomycosis,  1-5 bilateral. No signs of infection noted. Vasc  DP and PT pedal pulses palpable bilaterally. Temperature gradient within normal limits.  Neuro Epicritic and protective threshold sensation diminished bilaterally.  Musculoskeletal Exam No symptomatic pedal deformities noted bilateral. Muscular strength within normal limits.  Pain on palpation range of motion noted to the second MTP joint bilateral  ASSESSMENT 1. Diabetes Mellitus w/ peripheral neuropathy 2.  Pain due to onychomycosis of toenails bilateral 3.  Second MTP capsulitis bilateral  PLAN OF CARE 1. Patient evaluated today. 2. Instructed to maintain good pedal hygiene and foot care. Stressed importance of controlling blood sugar.  3. Mechanical debridement of nails 1-5 bilaterally performed using a nail nipper. Filed with dremel without incident.  4.  Injection of 0.5 cc Celestone Soluspan injection of the second MTP joint bilateral 5.  Return to clinic in 3 mos.     Edrick Kins, DPM [facility]  Dr. Edrick Kins, DPM    [address] [place] [address]                Office [phone]  Fax [phone]

## 2022-06-22 ENCOUNTER — Encounter: Payer: Self-pay | Admitting: Primary Care

## 2022-06-25 ENCOUNTER — Ambulatory Visit (INDEPENDENT_AMBULATORY_CARE_PROVIDER_SITE_OTHER): Payer: Medicare PPO

## 2022-06-25 VITALS — Ht 68.0 in | Wt 168.0 lb

## 2022-06-25 DIAGNOSIS — Z0000 Encounter for general adult medical examination without abnormal findings: Secondary | ICD-10-CM | POA: Diagnosis not present

## 2022-06-25 NOTE — Patient Instructions (Signed)
Ms. Cedillo , Thank you for taking time to come for your Medicare Wellness Visit. I appreciate your ongoing commitment to your health goals. Please review the following plan we discussed and let me know if I can assist you in the future.   Screening recommendations/referrals: Colonoscopy: not required Mammogram: completed 05/12/2022, due 05/14/2023 Bone Density: completed 07/15/2020 Recommended yearly ophthalmology/optometry visit for glaucoma screening and checkup Recommended yearly dental visit for hygiene and checkup  Vaccinations: Influenza vaccine: due 06/29/2022 Pneumococcal vaccine: completed 12/25/2019 Tdap vaccine: due Shingles vaccine: discussed   Covid-19: 09/19/2020, 02/06/2020, 01/16/2020  Advanced directives: Please bring a copy of your POA (Power of Attorney) and/or Living Will to your next appointment.   Conditions/risks identified: none  Next appointment: Follow up in one year for your annual wellness visit    Preventive Care 65 Years and Older, Female Preventive care refers to lifestyle choices and visits with your health care provider that can promote health and wellness. What does preventive care include? A yearly physical exam. This is also called an annual well check. Dental exams once or twice a year. Routine eye exams. Ask your health care provider how often you should have your eyes checked. Personal lifestyle choices, including: Daily care of your teeth and gums. Regular physical activity. Eating a healthy diet. Avoiding tobacco and drug use. Limiting alcohol use. Practicing safe sex. Taking low-dose aspirin every day. Taking vitamin and mineral supplements as recommended by your health care provider. What happens during an annual well check? The services and screenings done by your health care provider during your annual well check will depend on your age, overall health, lifestyle risk factors, and family history of disease. Counseling  Your health care  provider may ask you questions about your: Alcohol use. Tobacco use. Drug use. Emotional well-being. Home and relationship well-being. Sexual activity. Eating habits. History of falls. Memory and ability to understand (cognition). Work and work Statistician. Reproductive health. Screening  You may have the following tests or measurements: Height, weight, and BMI. Blood pressure. Lipid and cholesterol levels. These may be checked every 5 years, or more frequently if you are over 46 years old. Skin check. Lung cancer screening. You may have this screening every year starting at age 88 if you have a 30-pack-year history of smoking and currently smoke or have quit within the past 15 years. Fecal occult blood test (FOBT) of the stool. You may have this test every year starting at age 24. Flexible sigmoidoscopy or colonoscopy. You may have a sigmoidoscopy every 5 years or a colonoscopy every 10 years starting at age 62. Hepatitis C blood test. Hepatitis B blood test. Sexually transmitted disease (STD) testing. Diabetes screening. This is done by checking your blood sugar (glucose) after you have not eaten for a while (fasting). You may have this done every 1-3 years. Bone density scan. This is done to screen for osteoporosis. You may have this done starting at age 18. Mammogram. This may be done every 1-2 years. Talk to your health care provider about how often you should have regular mammograms. Talk with your health care provider about your test results, treatment options, and if necessary, the need for more tests. Vaccines  Your health care provider may recommend certain vaccines, such as: Influenza vaccine. This is recommended every year. Tetanus, diphtheria, and acellular pertussis (Tdap, Td) vaccine. You may need a Td booster every 10 years. Zoster vaccine. You may need this after age 62. Pneumococcal 13-valent conjugate (PCV13) vaccine. One dose is  recommended after age  76. Pneumococcal polysaccharide (PPSV23) vaccine. One dose is recommended after age 10. Talk to your health care provider about which screenings and vaccines you need and how often you need them. This information is not intended to replace advice given to you by your health care provider. Make sure you discuss any questions you have with your health care provider. Document Released: 12/12/2015 Document Revised: 08/04/2016 Document Reviewed: 09/16/2015 Elsevier Interactive Patient Education  2017 [employer] Prevention in the Home Falls can cause injuries. They can happen to people of all ages. There are many things you can do to make your home safe and to help prevent falls. What can I do on the outside of my home? Regularly fix the edges of walkways and driveways and fix any cracks. Remove anything that might make you trip as you walk through a door, such as a raised step or threshold. Trim any bushes or trees on the path to your home. Use bright outdoor lighting. Clear any walking paths of anything that might make someone trip, such as rocks or tools. Regularly check to see if handrails are loose or broken. Make sure that both sides of any steps have handrails. Any raised decks and porches should have guardrails on the edges. Have any leaves, snow, or ice cleared regularly. Use sand or salt on walking paths during winter. Clean up any spills in your garage right away. This includes oil or grease spills. What can I do in the bathroom? Use night lights. Install grab bars by the toilet and in the tub and shower. Do not use towel bars as grab bars. Use non-skid mats or decals in the tub or shower. If you need to sit down in the shower, use a plastic, non-slip stool. Keep the floor dry. Clean up any water that spills on the floor as soon as it happens. Remove soap buildup in the tub or shower regularly. Attach bath mats securely with double-sided non-slip rug tape. Do not have throw  rugs and other things on the floor that can make you trip. What can I do in the bedroom? Use night lights. Make sure that you have a light by your bed that is easy to reach. Do not use any sheets or blankets that are too big for your bed. They should not hang down onto the floor. Have a firm chair that has side arms. You can use this for support while you get dressed. Do not have throw rugs and other things on the floor that can make you trip. What can I do in the kitchen? Clean up any spills right away. Avoid walking on wet floors. Keep items that you use a lot in easy-to-reach places. If you need to reach something above you, use a strong step stool that has a grab bar. Keep electrical cords out of the way. Do not use floor polish or wax that makes floors slippery. If you must use wax, use non-skid floor wax. Do not have throw rugs and other things on the floor that can make you trip. What can I do with my stairs? Do not leave any items on the stairs. Make sure that there are handrails on both sides of the stairs and use them. Fix handrails that are broken or loose. Make sure that handrails are as long as the stairways. Check any carpeting to make sure that it is firmly attached to the stairs. Fix any carpet that is loose or worn. Avoid having  throw rugs at the top or bottom of the stairs. If you do have throw rugs, attach them to the floor with carpet tape. Make sure that you have a light switch at the top of the stairs and the bottom of the stairs. If you do not have them, ask someone to add them for you. What else can I do to help prevent falls? Wear shoes that: Do not have high heels. Have rubber bottoms. Are comfortable and fit you well. Are closed at the toe. Do not wear sandals. If you use a stepladder: Make sure that it is fully opened. Do not climb a closed stepladder. Make sure that both sides of the stepladder are locked into place. Ask someone to hold it for you, if  possible. Clearly mark and make sure that you can see: Any grab bars or handrails. First and last steps. Where the edge of each step is. Use tools that help you move around (mobility aids) if they are needed. These include: Canes. Walkers. Scooters. Crutches. Turn on the lights when you go into a dark area. Replace any light bulbs as soon as they burn out. Set up your furniture so you have a clear path. Avoid moving your furniture around. If any of your floors are uneven, fix them. If there are any pets around you, be aware of where they are. Review your medicines with your doctor. Some medicines can make you feel dizzy. This can increase your chance of falling. Ask your doctor what other things that you can do to help prevent falls. This information is not intended to replace advice given to you by your health care provider. Make sure you discuss any questions you have with your health care provider. Document Released: 09/11/2009 Document Revised: 04/22/2016 Document Reviewed: 12/20/2014 Elsevier Interactive Patient Education  2017 [employer].

## 2022-06-25 NOTE — Progress Notes (Signed)
I connected with [name] [name] today by telephone and verified that I am speaking with the correct person using two identifiers. Location patient: home Location provider: work Persons participating in the virtual visit: [name] [name], Glenna Durand LPN.   I discussed the limitations, risks, security and privacy concerns of performing an evaluation and management service by telephone and the availability of in person appointments. I also discussed with the patient that there may be a patient responsible charge related to this service. The patient expressed understanding and verbally consented to this telephonic visit.    Interactive audio and video telecommunications were attempted between this provider and patient, however failed, due to patient having technical difficulties OR patient did not have access to video capability.  We continued and completed visit with audio only.     Vital signs may be patient reported or missing.  Subjective:   [name] [name] is a 88 y.o. female who presents for Medicare Annual (Subsequent) preventive examination.  Review of Systems     Cardiac Risk Factors include: advanced age (>4mn, >>45women);diabetes mellitus;dyslipidemia;hypertension     Objective:    Today's Vitals   06/25/22 1511  Weight: 168 lb (76.2 kg)  Height: '5\' 8"'$  (1.727 m)   Body mass index is 25.54 kg/m.     06/25/2022    3:18 PM 06/23/2021    8:19 AM 04/30/2021    2:20 PM 06/20/2020    8:16 AM 04/27/2019    1:09 PM 04/27/2017   11:28 AM 03/22/2016   10:32 AM  Advanced Directives  Does Patient Have a Medical Advance Directive? Yes No No No No No No  Type of Advance Directive [place] Chart? No - copy requested        Would patient like information on creating a medical advance directive?  No - Patient declined No - Patient declined No - Patient declined No - Patient declined  Yes - Educational materials given     Current Medications (verified) Outpatient Encounter Medications as of 06/25/2022  Medication Sig   aspirin 81 MG tablet Take 81 mg by mouth daily.   Bempedoic Acid-Ezetimibe (NEXLIZET) 180-10 MG TABS Take 1 tablet by mouth daily.   Blood Glucose Calibration (TRUE METRIX LEVEL 1) Low SOLN Use to calibrate meter   Blood Glucose Monitoring Suppl (TRUE METRIX METER) DEVI Test blood sugars once or twice daily for diabetes.   brimonidine-timolol (COMBIGAN) 0.2-0.5 % ophthalmic solution Place 1 drop into both eyes every 12 (twelve) hours.   Cholecalciferol (VITAMIN D-3) 1000 UNITS CAPS Take 1 capsule by mouth daily.   Continuous Blood Gluc Receiver (FREESTYLE LIBRE 2 READER) DEVI Use to check blood sugars.   Continuous Blood Gluc Sensor (FREESTYLE LIBRE 2 SENSOR) MISC Apply every 14 days to check blood sugars.   ezetimibe (ZETIA) 10 MG tablet Take 10 mg by mouth daily.   fexofenadine (ALLEGRA) 180 MG tablet Take 1 tablet (180 mg total) by mouth as needed for allergies or rhinitis.   gabapentin (NEURONTIN) 100 MG capsule Take 100 mg by mouth 2 (two) times daily.   glipiZIDE (GLUCOTROL XL) 10 MG 24 hr tablet TAKE 1 TABLET EVERY MORNING WITH BREAKFAST FOR DIABETES   glucose blood (TRUE METRIX BLOOD GLUCOSE TEST) test strip TEST BLOOD SUGAR ONCE TO TWICE DAILY FOR DIABETES.   isosorbide mononitrate (IMDUR) 60 MG 24 hr tablet Take 1.5 tablets (90 mg total) by mouth daily.  metoprolol succinate (TOPROL-XL) 100 MG 24 hr tablet TAKE 1 TABLET EVERY MORNING WITH OR IMMEDIATELY FOLLOWING A MEAL (NEED MD APPOINTMENT)   nitroGLYCERIN (NITROSTAT) 0.4 MG SL tablet Place 1 tablet (0.4 mg total) under the tongue every 5 (five) minutes as needed for chest pain.   oxybutynin (DITROPAN-XL) 5 MG 24 hr tablet TAKE ONE TABLET BY MOUTH EVERY NIGHT AT BEDTIME FOR OVERACTIVE BLADDER   pantoprazole (PROTONIX) 20 MG tablet Take 1 tablet (20 mg total) by mouth daily. For heartburn.   sertraline (ZOLOFT) 100 MG tablet TAKE  ONE TABLET BY MOUTH DAILY FOR ANXIETY AND DEPRESSION   timolol (TIMOPTIC) 0.5 % ophthalmic solution    TRUEplus Lancets 33G MISC TEST BLOOD SUGAR ONCE TO TWICE DAILY FOR DIABETES.   vitamin B-12 (CYANOCOBALAMIN) 1000 MCG tablet Take 1,000 mcg by mouth daily.   doxycycline (VIBRA-TABS) 100 MG tablet Take 1 tablet (100 mg total) by mouth 2 (two) times daily. (Patient not taking: Reported on 06/25/2022)   [DISCONTINUED] loratadine (CLARITIN) 10 MG tablet Take 10 mg by mouth as needed.    No facility-administered encounter medications on file as of 06/25/2022.    Allergies (verified) Amoxicillin, Other, Shellfish allergy, Cephalexin, Ciprofloxacin, Clopidogrel bisulfate, Codeine phosphate, Ezetimibe-simvastatin, Hydrocod poli-chlorphe poli er, Lidocaine, Nitrofurantoin, Pregabalin, Propoxyphene n-acetaminophen, Rofecoxib, Sulfamethoxazole, Sulfonamide derivatives, Celecoxib, Codeine, Hydrocod poli-chlorphe poli er, Propoxyphene, and Sulfa antibiotics   History: Past Medical History:  Diagnosis Date   Acute bilateral thoracic back pain 08/16/2019   Allergy    Cipro, Plavix, Statins   CAD (coronary artery disease)    a. s/p tandem Promus DES to LAD in 2009 by Dr. Olevia Perches;  b.  LHC (03/22/14):  prox LAD 30%, mid LAD 40%, LAD stent ok with dist 20% ISR, apical LAD occluded with L-L collats filling apical vessel (too small for PCI), mid RCA 20%, EF 70%.  Med Rx   Diabetes mellitus    Diagnosed 2012   Diverticulosis of colon (without mention of hemorrhage)    GERD (gastroesophageal reflux disease)    Herpes zoster without complication 02/25/5187   Hx of cardiovascular stress test    a. Nuclear (08/2013):  No ischemia, EF 80%, Normal   Hx of echocardiogram    a. Echo (07/2013):  Mild LVH, vigorous LVF, EF 65-70%, Gr 1 DD, mild MR, mild to mod LAE   Hyperlipidemia    intol of statins   Hypertension    NHL (non-Hodgkin's lymphoma) ([place]) dx'd 2003   chemo/xrt comp 2003   Personal history of colonic  polyps    Proctitis    Pruritic intertrigo 07/18/2014   Urticaria    Past Surgical History:  Procedure Laterality Date   cardiac cath-neg     CORONARY ANGIOPLASTY WITH STENT PLACEMENT     x 2   dexa-neg     KNEE SURGERY  10/2003   left   laser surgery for cataracts-left     LEFT HEART CATHETERIZATION WITH CORONARY ANGIOGRAM N/A 03/22/2014   Procedure: LEFT HEART CATHETERIZATION WITH CORONARY ANGIOGRAM;  Surgeon: Burnell Blanks, MD;  Location: [facility] CATH LAB;  Service: Cardiovascular;  Laterality: N/A;   stress cardiolite     VAGINAL HYSTERECTOMY     partial, fibroids, one ovary left   Family History  Problem Relation Age of Onset   Cancer Mother        jaw   Hypertension Father    Heart attack Father    Heart attack Sister    Colon cancer Neg Hx  Social History   Socioeconomic History   Marital status: Widowed    Spouse name: Not on file   Number of children: Not on file   Years of education: Not on file   Highest education level: Not on file  Occupational History   Occupation: Retired    Fish farm manager: RETIRED  Tobacco Use   Smoking status: Never   Smokeless tobacco: Never  Vaping Use   Vaping Use: Never used  Substance and Sexual Activity   Alcohol use: No   Drug use: No   Sexual activity: Not Currently  Other Topics Concern   Not on file  Social History Narrative   Not on file   Social Determinants of Health   Financial Resource Strain: Low Risk  (06/25/2022)   Overall Financial Resource Strain (CARDIA)    Difficulty of Paying Living Expenses: Not hard at all  Food Insecurity: No Food Insecurity (06/25/2022)   Hunger Vital Sign    Worried About Running Out of Food in the Last Year: Never true    [place] in the Last Year: Never true  Transportation Needs: No Transportation Needs (06/25/2022)   PRAPARE - Hydrologist (Medical): No    Lack of Transportation (Non-Medical): No  Physical Activity: Inactive (06/25/2022)    Exercise Vital Sign    Days of Exercise per Week: 0 days    Minutes of Exercise per Session: 0 min  Stress: No Stress Concern Present (06/25/2022)   [place]    Feeling of Stress : Not at all  Social Connections: Not on file    Tobacco Counseling Counseling given: Not Answered   Clinical Intake:  Pre-visit preparation completed: Yes  Pain : No/denies pain     Nutritional Status: BMI 25 -29 Overweight Nutritional Risks: None Diabetes: Yes  How often do you need to have someone help you when you read instructions, pamphlets, or other written materials from your doctor or pharmacy?: 1 - Never What is the last grade level you completed in school?: 12th grade  Diabetic? Yes Nutrition Risk Assessment:  Has the patient had any N/V/D within the last 2 months?  No  Does the patient have any non-healing wounds?  No  Has the patient had any unintentional weight loss or weight gain?  No   Diabetes:  Is the patient diabetic?  Yes  If diabetic, was a CBG obtained today?  No  Did the patient bring in their glucometer from home?  No  How often do you monitor your CBG's? 2-3 daily.   Financial Strains and Diabetes Management:  Are you having any financial strains with the device, your supplies or your medication? No .  Does the patient want to be seen by Chronic Care Management for management of their diabetes?  No  Would the patient like to be referred to a Nutritionist or for Diabetic Management?  No   Diabetic Exams:  Diabetic Eye Exam: Completed 05/17/2022 Diabetic Foot Exam: Completed 01/25/2022   Interpreter Needed?: No  Information entered by :: NAllen LPN   Activities of Daily Living    06/25/2022    3:20 PM 06/22/2022    6:49 PM  In your present state of health, do you have any difficulty performing the following activities:  Hearing? 1 1  Comment hearing aids   Vision? 1 1  Comment can't see  well in the dark   Difficulty concentrating or making decisions?  0 0  Walking or climbing stairs? 0 0  Dressing or bathing? 0 0  Doing errands, shopping? 0 0  Preparing Food and eating ? N N  Using the Toilet? N N  In the past six months, have you accidently leaked urine? Y Y  Do you have problems with loss of bowel control? N N  Managing your Medications? N N  Managing your Finances? N N  Housekeeping or managing your Housekeeping? N N    Patient Care Team: Pleas Koch, NP as PCP - General (Internal Medicine) Burnell Blanks, MD as PCP - Cardiology (Cardiology) Rana Snare, MD (Inactive) as Consulting Physician (Urology) Ladene Artist, MD as Consulting Physician (Gastroenterology) Thelma Comp, OD as Consulting Physician (Optometry)  Indicate any recent Medical Services you may have received from other than [facility] providers in the past year (date may be approximate).     Assessment:   This is a routine wellness examination for [facility].  Hearing/Vision screen Vision Screening - Comments:: Regular eye exams, [place], [facility]  Dietary issues and exercise activities discussed: Current Exercise Habits: The patient does not participate in regular exercise at present   Goals Addressed             This Visit's Progress    Patient Stated       06/25/2022, no goals       Depression Screen    06/25/2022    3:20 PM 06/23/2021    8:21 AM 06/05/2021    8:31 AM 06/20/2020    8:19 AM 04/27/2019    1:17 PM 04/27/2019    1:10 PM 08/02/2018    9:52 AM  PHQ 2/9 Scores  PHQ - 2 Score 0 0 0 0 0 0 0  PHQ- 9 Score  0 0 0 0 0     Fall Risk    06/25/2022    3:19 PM 06/22/2022    6:49 PM 06/23/2021    8:20 AM 06/05/2021    8:31 AM 06/20/2020    8:17 AM  [place] in the past year? 0 1 0 0 0  Number falls in past yr: 0 0 0 0 0  Injury with Fall? 0 0 0 0 0  Risk for fall due to : Medication side effect  Medication side effect No Fall  Risks Medication side effect  Follow up Falls evaluation completed;Education provided;Falls prevention discussed  Falls evaluation completed;Falls prevention discussed  Falls evaluation completed;Falls prevention discussed    FALL RISK PREVENTION PERTAINING TO THE HOME:  Any stairs in or around the home? Yes  If so, are there any without handrails? No  Home free of loose throw rugs in walkways, pet beds, electrical cords, etc? Yes  Adequate lighting in your home to reduce risk of falls? Yes   ASSISTIVE DEVICES UTILIZED TO PREVENT FALLS:  Life alert? No  Use of a cane, walker or w/c? Yes  Grab bars in the bathroom? No  Shower chair or bench in shower? No  Elevated toilet seat or a handicapped toilet? Yes   TIMED UP AND GO:  Was the test performed? No .      Cognitive Function:    06/23/2021    8:23 AM 06/20/2020    8:24 AM 04/27/2019    1:20 PM 04/27/2017   11:29 AM 03/22/2016   10:34 AM  MMSE - Mini Mental State Exam  Orientation to time '5 5 5 5 5  '$ Orientation to  Place '5 5 5 5 5  '$ Registration '3 3 3 3 3  '$ Attention/ Calculation 5 5 0 0 0  Recall '2 3 3 3 3  '$ Language- name 2 objects   0 0 0  Language- repeat '1 1 1 1 1  '$ Language- follow 3 step command   0 3 3  Language- read & follow direction   0 0 0  Write a sentence   0 0 0  Copy design   0 0 0  Total score   '17 20 20        '$ 06/25/2022    3:22 PM  6CIT Screen  What Year? 0 points  What month? 0 points  What time? 0 points  Count back from 20 0 points  Months in reverse 0 points  Repeat phrase 6 points  Total Score 6 points    Immunizations Immunization History  Administered Date(s) Administered   Fluad Quad(high Dose 65+) 10/07/2021   Influenza Split 09/14/2011, 08/28/2012, 08/29/2020   Influenza Whole 09/09/2004, 09/05/2006, 08/31/2007, 08/29/2008, 09/18/2009, 09/01/2010   Influenza,inj,Quad PF,6+ Mos 08/26/2014, 08/06/2015, 09/14/2016, 09/08/2017, 08/02/2018, 08/16/2019   PFIZER(Purple Top)SARS-COV-2  Vaccination 01/16/2020, 02/06/2020, 09/19/2020   Pneumococcal Conjugate-13 08/26/2014   Pneumococcal Polysaccharide-23 09/09/2004, 12/25/2019   Td 05/30/1999   Zoster, Live 04/15/2010    TDAP status: Due, Education has been provided regarding the importance of this vaccine. Advised may receive this vaccine at local pharmacy or Health Dept. Aware to provide a copy of the vaccination record if obtained from local pharmacy or Health Dept. Verbalized acceptance and understanding.  Flu Vaccine status: Up to date  Pneumococcal vaccine status: Up to date  Covid-19 vaccine status: Completed vaccines  Qualifies for Shingles Vaccine? Yes   Zostavax completed Yes   Shingrix Completed?: No.    Education has been provided regarding the importance of this vaccine. Patient has been advised to call insurance company to determine out of pocket expense if they have not yet received this vaccine. Advised may also receive vaccine at local pharmacy or Health Dept. Verbalized acceptance and understanding.  Screening Tests Health Maintenance  Topic Date Due   Zoster Vaccines- Shingrix (1 of 2) Never done   COVID-19 Vaccine (4 - Booster for Pfizer series) 11/14/2020   URINE MICROALBUMIN  12/26/2020   TETANUS/TDAP  06/20/2024 (Originally 05/29/2009)   INFLUENZA VACCINE  06/29/2022   HEMOGLOBIN A1C  08/08/2022   FOOT EXAM  01/25/2023   MAMMOGRAM  05/13/2023   OPHTHALMOLOGY EXAM  05/18/2023   Pneumonia Vaccine 46+ Years old  Completed   DEXA SCAN  Completed   HPV VACCINES  Aged Out    Health Maintenance  Health Maintenance Due  Topic Date Due   Zoster Vaccines- Shingrix (1 of 2) Never done   COVID-19 Vaccine (4 - Booster for Pfizer series) 11/14/2020   URINE MICROALBUMIN  12/26/2020    Colorectal cancer screening: No longer required.   Mammogram status: Completed 05/12/2022. Repeat every year  Bone Density status: Completed 07/15/2020.   Lung Cancer Screening: (Low Dose CT Chest recommended if  Age 49-80 years, 30 pack-year currently smoking OR have quit w/in 15years.) does not qualify.   Lung Cancer Screening Referral: no  Additional Screening:  Hepatitis C Screening: does not qualify;   Vision Screening: Recommended annual ophthalmology exams for early detection of glaucoma and other disorders of the eye. Is the patient up to date with their annual eye exam?  Yes  Who is the provider or what is the name of  the office in which the patient attends annual eye exams? [facility] If pt is not established with a provider, would they like to be referred to a provider to establish care? No .   Dental Screening: Recommended annual dental exams for proper oral hygiene  Community Resource Referral / Chronic Care Management: CRR required this visit?  No   CCM required this visit?  No      Plan:     I have personally reviewed and noted the following in the patient's chart:   Medical and social history Use of alcohol, tobacco or illicit drugs  Current medications and supplements including opioid prescriptions.  Functional ability and status Nutritional status Physical activity Advanced directives List of other physicians Hospitalizations, surgeries, and ER visits in previous 12 months Vitals Screenings to include cognitive, depression, and falls Referrals and appointments  In addition, I have reviewed and discussed with patient certain preventive protocols, quality metrics, and best practice recommendations. A written personalized care plan for preventive services as well as general preventive health recommendations were provided to patient.     Kellie Simmering, LPN   7/47/3403   Nurse Notes: none  Due to this being a virtual visit, the after visit summary with patients personalized plan was offered to patient via mail or my-chart. Patient would like to access on my-chart

## 2022-06-28 ENCOUNTER — Telehealth: Payer: Self-pay | Admitting: *Deleted

## 2022-06-28 ENCOUNTER — Encounter: Payer: Self-pay | Admitting: Family Medicine

## 2022-06-28 ENCOUNTER — Ambulatory Visit: Payer: Medicare PPO | Admitting: Family Medicine

## 2022-06-28 VITALS — BP 112/60 | HR 67 | Temp 97.8°F | Ht 69.0 in | Wt 164.1 lb

## 2022-06-28 DIAGNOSIS — R300 Dysuria: Secondary | ICD-10-CM

## 2022-06-28 DIAGNOSIS — Z889 Allergy status to unspecified drugs, medicaments and biological substances status: Secondary | ICD-10-CM

## 2022-06-28 MED ORDER — AMOXICILLIN 875 MG PO TABS: 875.0000 mg | ORAL_TABLET | Freq: Two times a day (BID) | ORAL | 0 refills | Status: DC

## 2022-06-28 NOTE — Telephone Encounter (Signed)
PLEASE NOTE: All timestamps contained within this report are represented as [place] Standard Time. CONFIDENTIALTY NOTICE: This fax transmission is intended only for the addressee. It contains information that is legally privileged, confidential or otherwise protected from use or disclosure. If you are not the intended recipient, you are strictly prohibited from reviewing, disclosing, copying using or disseminating any of this information or taking any action in reliance on or regarding this information. If you have received this fax in error, please notify us immediately by telephone so that we can arrange for its return to [place]. Phone: [phone], Toll-Free: [phone], Fax: [phone] Page: 1 of 1 Call Id: [id number] [place] Night - Client Nonclinical Telephone Record  [facility] Client [place] Night - Client Client Site [place] Provider [name] - NP Contact Type Call Who Is Calling Patient / Member / Family / Caregiver Caller Name [place] Phone Number [phone] Patient Name [name] [name] Patient DOB 09-15-1932 Call Type Message Only Information Provided Reason for Call Request to Schedule Office Appointment Initial Comment Needs to make same day appt, has a UTI and needs rx called in. Patient request to speak to RN No Disp. Time Disposition Final User 06/28/2022 7:57:11 AM General Information Provided Yes Donato Heinz Call Closed By: Donato Heinz Transaction Date/Time: 06/28/2022 7:54:54 AM (ET)

## 2022-06-28 NOTE — Progress Notes (Signed)
Teddie Curd T. Bay Wayson, MD, [place] at [facility] [place], [address]  Phone: [phone]  FAX: [place] - 88 y.o. female  MRN [id number]  Date of Birth: 1932-10-31  Date: 06/28/2022  PCP: Pleas Koch, NP  Referral: Pleas Koch, NP  Chief Complaint  Patient presents with   Dysuria   Subjective:   [name] [name] is a 88 y.o. very pleasant female patient with Body mass index is 24.24 kg/m. who presents with the following:  UTI: She is having some burning as well as urgency that started over the weekend.  She has been taking AZO, and this has helped with her symptoms before.  She has had multiple UTIs in the past, not sure exactly what she has taken in the past for UTI.  Chart notes many different allergies, and while there is a noted amoxicillin allergy I did confirm with the patient and she has taken amoxicillin and Augmentin many times.  Also can review this and confirm in the chart.  Review of Systems is noted in the HPI, as appropriate  Objective:   BP 112/60   Pulse 67   Temp 97.8 F (36.6 C) (Oral)   Ht '5\' 9"'$  (1.753 m)   Wt 164 lb 2 oz (74.4 kg)   SpO2 96%   BMI 24.24 kg/m   GEN: No acute distress; alert,appropriate. PULM: Breathing comfortably in no respiratory distress PSYCH: Normally interactive.  No cvat  Laboratory and Imaging Data:  Assessment and Plan:     ICD-10-CM   1. Dysuria  R30.0 Urine Culture    2. Multiple drug allergies  Z88.9      On AZO, UA is not helpful.  Obtain a culture.  Multiple drug allergies and intolerances.  This is a challenge.  I confirmed with her many times that she cannot take amoxicillin, and I remove this from the patient's allergy.  She tolerates Augmentin and amoxicillin and has done so many times.  Medication Management during today's office visit: Meds ordered this encounter  Medications   amoxicillin (AMOXIL) 875 MG  tablet    Sig: Take 1 tablet (875 mg total) by mouth 2 (two) times daily.    Dispense:  14 tablet    Refill:  0   Medications Discontinued During This Encounter  Medication Reason   doxycycline (VIBRA-TABS) 100 MG tablet Completed Course    Orders placed today for conditions managed today: Orders Placed This Encounter  Procedures   Urine Culture    Follow-up if needed: No follow-ups on file.  Dr[employer] One speech-to-text software was used for transcription in this dictation.  Possible transcriptional errors can occur using Editor, commissioning.   Signed,  Maud Deed. Ysabelle Goodroe, MD   Outpatient Encounter Medications as of 06/28/2022  Medication Sig   amoxicillin (AMOXIL) 875 MG tablet Take 1 tablet (875 mg total) by mouth 2 (two) times daily.   aspirin 81 MG tablet Take 81 mg by mouth daily.   Bempedoic Acid-Ezetimibe (NEXLIZET) 180-10 MG TABS Take 1 tablet by mouth daily.   Blood Glucose Calibration (TRUE METRIX LEVEL 1) Low SOLN Use to calibrate meter   Blood Glucose Monitoring Suppl (TRUE METRIX METER) DEVI Test blood sugars once or twice daily for diabetes.   brimonidine-timolol (COMBIGAN) 0.2-0.5 % ophthalmic solution Place 1 drop into both eyes every 12 (twelve) hours.   Cholecalciferol (VITAMIN D-3) 1000 UNITS CAPS Take 1 capsule by mouth  daily.   Continuous Blood Gluc Receiver (FREESTYLE LIBRE 2 READER) DEVI Use to check blood sugars.   Continuous Blood Gluc Sensor (FREESTYLE LIBRE 2 SENSOR) MISC Apply every 14 days to check blood sugars.   ezetimibe (ZETIA) 10 MG tablet Take 10 mg by mouth daily.   fexofenadine (ALLEGRA) 180 MG tablet Take 1 tablet (180 mg total) by mouth as needed for allergies or rhinitis.   gabapentin (NEURONTIN) 100 MG capsule Take 100 mg by mouth 2 (two) times daily.   glipiZIDE (GLUCOTROL XL) 10 MG 24 hr tablet TAKE 1 TABLET EVERY MORNING WITH BREAKFAST FOR DIABETES   glucose blood (TRUE METRIX BLOOD GLUCOSE TEST) test strip TEST BLOOD SUGAR ONCE TO  TWICE DAILY FOR DIABETES.   isosorbide mononitrate (IMDUR) 60 MG 24 hr tablet Take 1.5 tablets (90 mg total) by mouth daily.   metoprolol succinate (TOPROL-XL) 100 MG 24 hr tablet TAKE 1 TABLET EVERY MORNING WITH OR IMMEDIATELY FOLLOWING A MEAL (NEED MD APPOINTMENT)   nitroGLYCERIN (NITROSTAT) 0.4 MG SL tablet Place 1 tablet (0.4 mg total) under the tongue every 5 (five) minutes as needed for chest pain.   oxybutynin (DITROPAN-XL) 5 MG 24 hr tablet TAKE ONE TABLET BY MOUTH EVERY NIGHT AT BEDTIME FOR OVERACTIVE BLADDER   pantoprazole (PROTONIX) 20 MG tablet Take 1 tablet (20 mg total) by mouth daily. For heartburn.   sertraline (ZOLOFT) 100 MG tablet TAKE ONE TABLET BY MOUTH DAILY FOR ANXIETY AND DEPRESSION   timolol (TIMOPTIC) 0.5 % ophthalmic solution    TRUEplus Lancets 33G MISC TEST BLOOD SUGAR ONCE TO TWICE DAILY FOR DIABETES.   vitamin B-12 (CYANOCOBALAMIN) 1000 MCG tablet Take 1,000 mcg by mouth daily.   VYZULTA 0.024 % SOLN Apply 1 drop to eye at bedtime.   [DISCONTINUED] doxycycline (VIBRA-TABS) 100 MG tablet Take 1 tablet (100 mg total) by mouth 2 (two) times daily. (Patient not taking: Reported on 06/25/2022)   [DISCONTINUED] loratadine (CLARITIN) 10 MG tablet Take 10 mg by mouth as needed.    No facility-administered encounter medications on file as of 06/28/2022.

## 2022-06-28 NOTE — Telephone Encounter (Signed)
Patient scheduled for an appointment today 06/28/22 with Dr, [name].

## 2022-06-29 ENCOUNTER — Encounter: Payer: Self-pay | Admitting: Primary Care

## 2022-06-30 LAB — URINE CULTURE
MICRO NUMBER:: 13714956
SPECIMEN QUALITY:: ADEQUATE

## 2022-07-14 DIAGNOSIS — E1122 Type 2 diabetes mellitus with diabetic chronic kidney disease: Secondary | ICD-10-CM | POA: Diagnosis not present

## 2022-07-16 ENCOUNTER — Other Ambulatory Visit: Payer: Self-pay | Admitting: Primary Care

## 2022-07-16 DIAGNOSIS — K219 Gastro-esophageal reflux disease without esophagitis: Secondary | ICD-10-CM

## 2022-07-16 DIAGNOSIS — Z6379 Other stressful life events affecting family and household: Secondary | ICD-10-CM

## 2022-07-19 DIAGNOSIS — H401133 Primary open-angle glaucoma, bilateral, severe stage: Secondary | ICD-10-CM | POA: Diagnosis not present

## 2022-07-26 ENCOUNTER — Ambulatory Visit: Payer: Medicare PPO | Admitting: Podiatry

## 2022-07-30 DIAGNOSIS — H903 Sensorineural hearing loss, bilateral: Secondary | ICD-10-CM | POA: Diagnosis not present

## 2022-08-13 ENCOUNTER — Ambulatory Visit: Payer: Medicare PPO | Admitting: Primary Care

## 2022-08-14 DIAGNOSIS — E1165 Type 2 diabetes mellitus with hyperglycemia: Secondary | ICD-10-CM | POA: Diagnosis not present

## 2022-08-18 ENCOUNTER — Ambulatory Visit: Payer: Medicare PPO | Admitting: Primary Care

## 2022-08-18 ENCOUNTER — Encounter: Payer: Self-pay | Admitting: Primary Care

## 2022-08-18 VITALS — BP 128/74 | HR 65 | Temp 97.7°F | Ht 69.0 in | Wt 166.0 lb

## 2022-08-18 DIAGNOSIS — N1832 Chronic kidney disease, stage 3b: Secondary | ICD-10-CM | POA: Diagnosis not present

## 2022-08-18 DIAGNOSIS — I2510 Atherosclerotic heart disease of native coronary artery without angina pectoris: Secondary | ICD-10-CM | POA: Diagnosis not present

## 2022-08-18 DIAGNOSIS — Z23 Encounter for immunization: Secondary | ICD-10-CM | POA: Diagnosis not present

## 2022-08-18 DIAGNOSIS — F32A Depression, unspecified: Secondary | ICD-10-CM

## 2022-08-18 DIAGNOSIS — E1122 Type 2 diabetes mellitus with diabetic chronic kidney disease: Secondary | ICD-10-CM | POA: Diagnosis not present

## 2022-08-18 DIAGNOSIS — K219 Gastro-esophageal reflux disease without esophagitis: Secondary | ICD-10-CM | POA: Diagnosis not present

## 2022-08-18 DIAGNOSIS — K5900 Constipation, unspecified: Secondary | ICD-10-CM

## 2022-08-18 DIAGNOSIS — I10 Essential (primary) hypertension: Secondary | ICD-10-CM | POA: Diagnosis not present

## 2022-08-18 DIAGNOSIS — N3941 Urge incontinence: Secondary | ICD-10-CM

## 2022-08-18 DIAGNOSIS — M25571 Pain in right ankle and joints of right foot: Secondary | ICD-10-CM

## 2022-08-18 DIAGNOSIS — F419 Anxiety disorder, unspecified: Secondary | ICD-10-CM

## 2022-08-18 DIAGNOSIS — E2839 Other primary ovarian failure: Secondary | ICD-10-CM

## 2022-08-18 DIAGNOSIS — E782 Mixed hyperlipidemia: Secondary | ICD-10-CM

## 2022-08-18 DIAGNOSIS — Z0000 Encounter for general adult medical examination without abnormal findings: Secondary | ICD-10-CM

## 2022-08-18 DIAGNOSIS — M8580 Other specified disorders of bone density and structure, unspecified site: Secondary | ICD-10-CM

## 2022-08-18 DIAGNOSIS — I351 Nonrheumatic aortic (valve) insufficiency: Secondary | ICD-10-CM

## 2022-08-18 DIAGNOSIS — M25572 Pain in left ankle and joints of left foot: Secondary | ICD-10-CM

## 2022-08-18 LAB — CBC
HCT: 35.5 % — ABNORMAL LOW (ref 36.0–46.0)
Hemoglobin: 11.6 g/dL — ABNORMAL LOW (ref 12.0–15.0)
MCHC: 32.7 g/dL (ref 30.0–36.0)
MCV: 93 fl (ref 78.0–100.0)
Platelets: 230 10*3/uL (ref 150.0–400.0)
RBC: 3.82 Mil/uL — ABNORMAL LOW (ref 3.87–5.11)
RDW: 13.6 % (ref 11.5–15.5)
WBC: 3.3 10*3/uL — ABNORMAL LOW (ref 4.0–10.5)

## 2022-08-18 LAB — POCT GLYCOSYLATED HEMOGLOBIN (HGB A1C): Hemoglobin A1C: 7.5 % — AB (ref 4.0–5.6)

## 2022-08-18 LAB — COMPREHENSIVE METABOLIC PANEL
ALT: 34 U/L (ref 0–35)
AST: 52 U/L — ABNORMAL HIGH (ref 0–37)
Albumin: 4.1 g/dL (ref 3.5–5.2)
Alkaline Phosphatase: 34 U/L — ABNORMAL LOW (ref 39–117)
BUN: 25 mg/dL — ABNORMAL HIGH (ref 6–23)
CO2: 29 mEq/L (ref 19–32)
Calcium: 9.8 mg/dL (ref 8.4–10.5)
Chloride: 100 mEq/L (ref 96–112)
Creatinine, Ser: 1.35 mg/dL — ABNORMAL HIGH (ref 0.40–1.20)
GFR: 35.08 mL/min — ABNORMAL LOW (ref >60.00)
Glucose, Bld: 194 mg/dL — ABNORMAL HIGH (ref 70–99)
Potassium: 4.8 mEq/L (ref 3.5–5.1)
Sodium: 137 mEq/L (ref 135–145)
Total Bilirubin: 0.6 mg/dL (ref 0.2–1.2)
Total Protein: 6.9 g/dL (ref 6.0–8.3)

## 2022-08-18 NOTE — Assessment & Plan Note (Signed)
LDL at goal of <70 from labs in March 2023.  Continue Zetia 10 mg daily, Nexlizet per pharmacy.

## 2022-08-18 NOTE — Assessment & Plan Note (Signed)
Controlled.  Continue Zoloft 100 mg daily. Continue to monitor.

## 2022-08-18 NOTE — Assessment & Plan Note (Signed)
Ongoing, increased symptoms with oxybutynin XL 5 mg during bedtime. Switch oxybutynin XL 5 mg to AM.  She will update.

## 2022-08-18 NOTE — Assessment & Plan Note (Addendum)
Following with cardiology, reviewed office notes from November 2022.  Continue isosorbide 90 mg daily, aspirin 81 mg daily, metoprolol succinate 100 mg daily, Zetia 10 mg daily, Nexlizet per pharmacy, blood pressure and diabetes control.  Intolerant to statins.

## 2022-08-18 NOTE — Assessment & Plan Note (Signed)
Immunizations UTD. Influenza vaccine provided today.  Discussed the importance of a healthy diet and regular exercise in order for weight loss, and to reduce the risk of further co-morbidity.  Exam stable. Labs pending and also reviewed.  Follow up in 1 year for repeat physical.

## 2022-08-18 NOTE — Assessment & Plan Note (Signed)
Following with podiatry.  Continue gabapentin 100 mg BID.

## 2022-08-18 NOTE — Assessment & Plan Note (Signed)
Controlled!  Continue metoprolol succinate 100 mg daily, Imdur 90 mg daily.

## 2022-08-18 NOTE — Assessment & Plan Note (Signed)
Chronic and intermittent.  Discussed Colace 100 mg 1-2 times daily or Miralax PRN.

## 2022-08-18 NOTE — Assessment & Plan Note (Signed)
Follow with cardiology. Reviewed echocardiogram results from May 2023.

## 2022-08-18 NOTE — Progress Notes (Signed)
Subjective:    Patient ID: [name] [name], female    DOB: 11-14-1932, 88 y.o.   MRN: [id number]  HPI  [name] [name] is a very pleasant 88 y.o. female who presents today for complete physical and follow up of chronic conditions.  Immunizations: -Influenza: Due today -Covid-19: 3 vaccines -Shingles: Completed Zostavax -Pneumonia: Prevnar 13 in 2015, Pneumovax 23 2021  Diet: [place].  Exercise: No regular exercise.  Eye exam: Completes regularly   Dental exam: Completes semi-annually   Mammogram: Completed in June 2023  Colonoscopy: Completed in 2012,N/A given age Dexa: Completed in August 2021  BP Readings from Last 3 Encounters:  08/18/22 128/74  06/28/22 112/60  04/12/22 140/70       Review of Systems  Constitutional:  Negative for unexpected weight change.  HENT:  Negative for rhinorrhea.   Respiratory:  Negative for cough and shortness of breath.   Cardiovascular:  Negative for chest pain.  Gastrointestinal:  Negative for constipation and diarrhea.  Genitourinary:  Negative for difficulty urinating.  Musculoskeletal:  Positive for arthralgias.  Skin:  Negative for rash.  Allergic/Immunologic: Negative for environmental allergies.  Neurological:  Negative for dizziness and headaches.  Psychiatric/Behavioral:  The patient is not nervous/anxious.          Past Medical History:  Diagnosis Date   Acute bilateral thoracic back pain 08/16/2019   Allergy    Cipro, Plavix, Statins   CAD (coronary artery disease)    a. s/p tandem Promus DES to LAD in 2009 by Dr. Olevia Perches;  b.  LHC (03/22/14):  prox LAD 30%, mid LAD 40%, LAD stent ok with dist 20% ISR, apical LAD occluded with L-L collats filling apical vessel (too small for PCI), mid RCA 20%, EF 70%.  Med Rx   Diabetes mellitus    Diagnosed 2012   Diverticulosis of colon (without mention of hemorrhage)    GERD (gastroesophageal reflux disease)    Herpes zoster without complication 0/53/9767   Hx of cardiovascular  stress test    a. Nuclear (08/2013):  No ischemia, EF 80%, Normal   Hx of echocardiogram    a. Echo (07/2013):  Mild LVH, vigorous LVF, EF 65-70%, Gr 1 DD, mild MR, mild to mod LAE   Hyperlipidemia    intol of statins   Hypertension    NHL (non-Hodgkin's lymphoma) ([place]) dx'd 2003   chemo/xrt comp 2003   Personal history of colonic polyps    Proctitis    Pruritic intertrigo 07/18/2014   Urticaria     Social History   Socioeconomic History   Marital status: Widowed    Spouse name: Not on file   Number of children: Not on file   Years of education: Not on file   Highest education level: Not on file  Occupational History   Occupation: Retired    Fish farm manager: RETIRED  Tobacco Use   Smoking status: Never   Smokeless tobacco: Never  Vaping Use   Vaping Use: Never used  Substance and Sexual Activity   Alcohol use: No   Drug use: No   Sexual activity: Not Currently  Other Topics Concern   Not on file  Social History Narrative   Not on file   Social Determinants of Health   Financial Resource Strain: Low Risk  (06/25/2022)   Overall Financial Resource Strain (CARDIA)    Difficulty of Paying Living Expenses: Not hard at all  Food Insecurity: No Food Insecurity (06/25/2022)   Hunger Vital Sign  Worried About Charity fundraiser in the Last Year: Never true    [place] in the Last Year: Never true  Transportation Needs: No Transportation Needs (06/25/2022)   PRAPARE - Hydrologist (Medical): No    Lack of Transportation (Non-Medical): No  Physical Activity: Inactive (06/25/2022)   Exercise Vital Sign    Days of Exercise per Week: 0 days    Minutes of Exercise per Session: 0 min  Stress: No Stress Concern Present (06/25/2022)   [place]    Feeling of Stress : Not at all  Social Connections: Not on file  Intimate Partner Violence: Not At Risk (06/23/2021)   Humiliation, Afraid,  Rape, and Kick questionnaire    Fear of Current or Ex-Partner: No    Emotionally Abused: No    Physically Abused: No    Sexually Abused: No    Past Surgical History:  Procedure Laterality Date   cardiac cath-neg     CORONARY ANGIOPLASTY WITH STENT PLACEMENT     x 2   dexa-neg     KNEE SURGERY  10/2003   left   laser surgery for cataracts-left     LEFT HEART CATHETERIZATION WITH CORONARY ANGIOGRAM N/A 03/22/2014   Procedure: LEFT HEART CATHETERIZATION WITH CORONARY ANGIOGRAM;  Surgeon: Burnell Blanks, MD;  Location: [facility] CATH LAB;  Service: Cardiovascular;  Laterality: N/A;   stress cardiolite     VAGINAL HYSTERECTOMY     partial, fibroids, one ovary left    Family History  Problem Relation Age of Onset   Cancer Mother        jaw   Hypertension Father    Heart attack Father    Heart attack Sister    Colon cancer Neg Hx     Allergies  Allergen Reactions   Other Rash and Anaphylaxis   Shellfish Allergy Anaphylaxis and Rash   Cephalexin     REACTION: trash and swelling REACTION: trash and swelling   Ciprofloxacin     REACTION: u/k   Clopidogrel Bisulfate     REACTION: swelling, rash   Codeine Phosphate     REACTION: unspecified   Ezetimibe-Simvastatin     REACTION: myalgias REACTION: myalgias   Hydrocod Poli-Chlorphe Poli Er     REACTION: u/k   Lidocaine Hives    ONLY TO ADHESIVE LIDOCAINE PATCHES. NO PROBLEM WITH INJECTABLE LIDOCAINE.   Nitrofurantoin     REACTION: [place] REACTION: [place]   Pregabalin     REACTION: felt bad REACTION: felt bad   Propoxyphene N-Acetaminophen     REACTION: [place]   Rofecoxib     unk unk    Sulfamethoxazole     REACTION: unspecified   Sulfonamide Derivatives     REACTION: unspecified   Celecoxib Rash    REACTION: [place] REACTION: [place]   Codeine Rash    REACTION: [place] REACTION: [place]   Hydrocod Poli-Chlorphe Poli Er Rash   Propoxyphene Rash   Sulfa Antibiotics Rash    REACTION: unspecified REACTION: unspecified    Current  Outpatient Medications on File Prior to Visit  Medication Sig Dispense Refill   aspirin 81 MG tablet Take 81 mg by mouth daily.     Bempedoic Acid-Ezetimibe (NEXLIZET) 180-10 MG TABS Take 1 tablet by mouth daily. 30 tablet 11   Blood Glucose Calibration (TRUE METRIX LEVEL 1) Low SOLN Use to calibrate meter 3 each 0   Blood Glucose Monitoring Suppl (TRUE  METRIX METER) DEVI Test blood sugars once or twice daily for diabetes. 1 each 0   brimonidine-timolol (COMBIGAN) 0.2-0.5 % ophthalmic solution Place 1 drop into both eyes every 12 (twelve) hours.     Cholecalciferol (VITAMIN D-3) 1000 UNITS CAPS Take 1 capsule by mouth daily.     Continuous Blood Gluc Receiver (FREESTYLE LIBRE 2 READER) DEVI Use to check blood sugars. 1 each 0   Continuous Blood Gluc Sensor (FREESTYLE LIBRE 2 SENSOR) MISC Apply every 14 days to check blood sugars. 6 each 1   ezetimibe (ZETIA) 10 MG tablet Take 10 mg by mouth daily.     fexofenadine (ALLEGRA) 180 MG tablet Take 1 tablet (180 mg total) by mouth as needed for allergies or rhinitis. 90 tablet 0   gabapentin (NEURONTIN) 100 MG capsule Take 100 mg by mouth 2 (two) times daily.     glipiZIDE (GLUCOTROL XL) 10 MG 24 hr tablet TAKE 1 TABLET EVERY MORNING WITH BREAKFAST FOR DIABETES 90 tablet 0   glucose blood (TRUE METRIX BLOOD GLUCOSE TEST) test strip TEST BLOOD SUGAR ONCE TO TWICE DAILY FOR DIABETES. 200 strip 1   isosorbide mononitrate (IMDUR) 60 MG 24 hr tablet Take 1.5 tablets (90 mg total) by mouth daily. 135 tablet 3   metoprolol succinate (TOPROL-XL) 100 MG 24 hr tablet TAKE 1 TABLET EVERY MORNING WITH OR IMMEDIATELY FOLLOWING A MEAL (NEED MD APPOINTMENT) 90 tablet 0   nitroGLYCERIN (NITROSTAT) 0.4 MG SL tablet Place 1 tablet (0.4 mg total) under the tongue every 5 (five) minutes as needed for chest pain. 26 tablet 0   pantoprazole (PROTONIX) 20 MG tablet TAKE ONE TABLET BY MOUTH DAILY FOR HEARTBURN 90 tablet 0   sertraline (ZOLOFT) 100 MG tablet TAKE 1 TABLET BY  MOUTH DAILY FOR ANIXETY AND DEPRESSION 90 tablet 0   timolol (TIMOPTIC) 0.5 % ophthalmic solution      TRUEplus Lancets 33G MISC TEST BLOOD SUGAR ONCE TO TWICE DAILY FOR DIABETES. 200 each 3   vitamin B-12 (CYANOCOBALAMIN) 1000 MCG tablet Take 1,000 mcg by mouth daily.     VYZULTA 0.024 % SOLN Apply 1 drop to eye at bedtime.     [DISCONTINUED] loratadine (CLARITIN) 10 MG tablet Take 10 mg by mouth as needed.      No current facility-administered medications on file prior to visit.    BP 128/74   Pulse 65   Temp 97.7 F (36.5 C) (Temporal)   Ht '5\' 9"'$  (1.753 m)   Wt 166 lb (75.3 kg)   SpO2 98%   BMI 24.51 kg/m  Objective:   Physical Exam HENT:     Right Ear: Tympanic membrane and ear canal normal.     Left Ear: Tympanic membrane and ear canal normal.     Nose: Nose normal.  Eyes:     Conjunctiva/sclera: Conjunctivae normal.     Pupils: Pupils are equal, round, and reactive to light.  Neck:     Thyroid: No thyromegaly.  Cardiovascular:     Rate and Rhythm: Normal rate and regular rhythm.     Heart sounds: No murmur heard. Pulmonary:     Effort: Pulmonary effort is normal.     Breath sounds: Normal breath sounds. No rales.  Abdominal:     General: Bowel sounds are normal.     Palpations: Abdomen is soft.     Tenderness: There is no abdominal tenderness.  Musculoskeletal:        General: Normal range of motion.  Cervical back: Neck supple.  Lymphadenopathy:     Cervical: No cervical adenopathy.  Skin:    General: Skin is warm and dry.     Findings: No rash.  Neurological:     Mental Status: She is alert and oriented to person, place, and time.     Cranial Nerves: No cranial nerve deficit.     Deep Tendon Reflexes: Reflexes are normal and symmetric.  Psychiatric:        Mood and Affect: Mood normal.           Assessment & Plan:   Problem List Items Addressed This Visit       Cardiovascular and Mediastinum   Essential hypertension     Controlled!  Continue metoprolol succinate 100 mg daily, Imdur 90 mg daily.        Relevant Orders   Comprehensive metabolic panel   CBC   Coronary artery disease involving native coronary artery of native heart without angina pectoris    Following with cardiology, reviewed office notes from November 2022.  Continue isosorbide 90 mg daily, aspirin 81 mg daily, metoprolol succinate 100 mg daily, Zetia 10 mg daily, Nexlizet per pharmacy, blood pressure and diabetes control.  Intolerant to statins.      Nonrheumatic aortic valve insufficiency    Follow with cardiology. Reviewed echocardiogram results from May 2023.        Digestive   GERD    Controlled.  Continue pantoprazole 20 mg daily.         Endocrine   Type 2 diabetes mellitus with chronic kidney disease ([place])    Overall controlled with A1C of 7.5 today. Discussed to work on diet, obtain regular exercise.  Continue glipizide XL 10 mg daily. Intolerant to statin therapy. Foot exam UTD. Pneumonia vaccine UTD.  Follow up in 6 months.      Relevant Orders   POCT glycosylated hemoglobin (Hb A1C) (Completed)     Musculoskeletal and Integument   Osteopenia    Bone density scan due and scheduled for next month. Continue calcium and vitamin D. Encouraged daily walking.      Relevant Orders   DG Bone Density     Other   MIXED HYPERLIPIDEMIA    LDL at goal of <70 from labs in March 2023.  Continue Zetia 10 mg daily, Nexlizet per pharmacy.      Constipation    Chronic and intermittent.  Discussed Colace 100 mg 1-2 times daily or Miralax PRN.      Pain in joints of both feet    Following with podiatry.  Continue gabapentin 100 mg BID.      Anxiety and depression    Controlled.  Continue Zoloft 100 mg daily. Continue to monitor.      Preventative health care - Primary    Immunizations UTD. Influenza vaccine provided today.  Discussed the importance of a healthy diet and regular exercise in  order for weight loss, and to reduce the risk of further co-morbidity.  Exam stable. Labs pending and also reviewed.  Follow up in 1 year for repeat physical.       Urge incontinence of urine    Ongoing, increased symptoms with oxybutynin XL 5 mg during bedtime. Switch oxybutynin XL 5 mg to AM.  She will update.       Other Visit Diagnoses     Estrogen deficiency       Relevant Orders   DG Bone Density  Pleas Koch, NP

## 2022-08-18 NOTE — Patient Instructions (Signed)
Stop by the lab prior to leaving today. I will notify you of your results once received.   Try taking the oxybutynin in the morning for bladder control.  Try docusate sodium (Colace) 100 mg (stool softener) once or twice daily to help with your bowel movements.  Complete your bone density scan as discussed.   Please schedule a follow up visit for 6 months for a diabetes check.  It was a pleasure to see you today!  Preventive Care 65 Years and Older, Female Preventive care refers to lifestyle choices and visits with your health care provider that can promote health and wellness. Preventive care visits are also called wellness exams. What can I expect for my preventive care visit? Counseling Your health care provider may ask you questions about your: Medical history, including: Past medical problems. Family medical history. Pregnancy and menstrual history. History of falls. Current health, including: Memory and ability to understand (cognition). Emotional well-being. Home life and relationship well-being. Sexual activity and sexual health. Lifestyle, including: Alcohol, nicotine or tobacco, and drug use. Access to firearms. Diet, exercise, and sleep habits. Work and work Statistician. Sunscreen use. Safety issues such as seatbelt and bike helmet use. Physical exam Your health care provider will check your: Height and weight. These may be used to calculate your BMI (body mass index). BMI is a measurement that tells if you are at a healthy weight. Waist circumference. This measures the distance around your waistline. This measurement also tells if you are at a healthy weight and may help predict your risk of certain diseases, such as type 2 diabetes and high blood pressure. Heart rate and blood pressure. Body temperature. Skin for abnormal spots. What immunizations do I need?  Vaccines are usually given at various ages, according to a schedule. Your health care provider will  recommend vaccines for you based on your age, medical history, and lifestyle or other factors, such as travel or where you work. What tests do I need? Screening Your health care provider may recommend screening tests for certain conditions. This may include: Lipid and cholesterol levels. Hepatitis C test. Hepatitis B test. HIV (human immunodeficiency virus) test. STI (sexually transmitted infection) testing, if you are at risk. Lung cancer screening. Colorectal cancer screening. Diabetes screening. This is done by checking your blood sugar (glucose) after you have not eaten for a while (fasting). Mammogram. Talk with your health care provider about how often you should have regular mammograms. BRCA-related cancer screening. This may be done if you have a family history of breast, ovarian, tubal, or peritoneal cancers. Bone density scan. This is done to screen for osteoporosis. Talk with your health care provider about your test results, treatment options, and if necessary, the need for more tests. Follow these instructions at home: Eating and drinking  Eat a diet that includes fresh fruits and vegetables, whole grains, lean protein, and low-fat dairy products. Limit your intake of foods with high amounts of sugar, saturated fats, and salt. Take vitamin and mineral supplements as recommended by your health care provider. Do not drink alcohol if your health care provider tells you not to drink. If you drink alcohol: Limit how much you have to 0-1 drink a day. Know how much alcohol is in your drink. In the [place] one drink equals one 12 oz bottle of beer (355 mL), one 5 oz glass of wine (148 mL), or one 1 oz glass of hard liquor (44 mL). Lifestyle Brush your teeth every morning and night with fluoride  toothpaste. Floss one time each day. Exercise for at least 30 minutes 5 or more days each week. Do not use any products that contain nicotine or tobacco. These products include cigarettes,  chewing tobacco, and vaping devices, such as e-cigarettes. If you need help quitting, ask your health care provider. Do not use drugs. If you are sexually active, practice safe sex. Use a condom or other form of protection in order to prevent STIs. Take aspirin only as told by your health care provider. Make sure that you understand how much to take and what form to take. Work with your health care provider to find out whether it is safe and beneficial for you to take aspirin daily. Ask your health care provider if you need to take a cholesterol-lowering medicine (statin). Find healthy ways to manage stress, such as: Meditation, yoga, or listening to music. Journaling. Talking to a trusted person. Spending time with friends and family. Minimize exposure to UV radiation to reduce your risk of skin cancer. Safety Always wear your seat belt while driving or riding in a vehicle. Do not drive: If you have been drinking alcohol. Do not ride with someone who has been drinking. When you are tired or distracted. While texting. If you have been using any mind-altering substances or drugs. Wear a helmet and other protective equipment during sports activities. If you have firearms in your house, make sure you follow all gun safety procedures. What's next? Visit your health care provider once a year for an annual wellness visit. Ask your health care provider how often you should have your eyes and teeth checked. Stay up to date on all vaccines. This information is not intended to replace advice given to you by your health care provider. Make sure you discuss any questions you have with your health care provider. Document Revised: 05/13/2021 Document Reviewed: 05/13/2021 Elsevier Patient Education  [place].

## 2022-08-18 NOTE — Assessment & Plan Note (Signed)
Bone density scan due and scheduled for next month. Continue calcium and vitamin D. Encouraged daily walking.

## 2022-08-18 NOTE — Assessment & Plan Note (Signed)
Controlled.  Continue pantoprazole 20 mg daily.  

## 2022-08-18 NOTE — Assessment & Plan Note (Signed)
Overall controlled with A1C of 7.5 today. Discussed to work on diet, obtain regular exercise.  Continue glipizide XL 10 mg daily. Intolerant to statin therapy. Foot exam UTD. Pneumonia vaccine UTD.  Follow up in 6 months.

## 2022-08-19 ENCOUNTER — Other Ambulatory Visit: Payer: Self-pay | Admitting: Primary Care

## 2022-08-19 DIAGNOSIS — R7989 Other specified abnormal findings of blood chemistry: Secondary | ICD-10-CM

## 2022-08-22 ENCOUNTER — Other Ambulatory Visit: Payer: Self-pay | Admitting: Primary Care

## 2022-08-22 DIAGNOSIS — E0821 Diabetes mellitus due to underlying condition with diabetic nephropathy: Secondary | ICD-10-CM

## 2022-08-27 ENCOUNTER — Ambulatory Visit: Payer: Medicare PPO | Admitting: Primary Care

## 2022-08-27 ENCOUNTER — Other Ambulatory Visit (INDEPENDENT_AMBULATORY_CARE_PROVIDER_SITE_OTHER): Payer: Medicare PPO

## 2022-08-27 ENCOUNTER — Ambulatory Visit (INDEPENDENT_AMBULATORY_CARE_PROVIDER_SITE_OTHER)
Admission: RE | Admit: 2022-08-27 | Discharge: 2022-08-27 | Disposition: A | Discharge status: Home / Self Care | Payer: Medicare PPO | Source: Ambulatory Visit | Attending: Primary Care | Admitting: Primary Care

## 2022-08-27 ENCOUNTER — Other Ambulatory Visit: Payer: Medicare PPO

## 2022-08-27 ENCOUNTER — Other Ambulatory Visit: Payer: Self-pay | Admitting: Primary Care

## 2022-08-27 ENCOUNTER — Encounter: Payer: Self-pay | Admitting: Primary Care

## 2022-08-27 VITALS — BP 116/60 | HR 60 | Temp 97.2°F | Ht 69.0 in | Wt 166.0 lb

## 2022-08-27 DIAGNOSIS — G8929 Other chronic pain: Secondary | ICD-10-CM | POA: Diagnosis not present

## 2022-08-27 DIAGNOSIS — M25561 Pain in right knee: Secondary | ICD-10-CM

## 2022-08-27 DIAGNOSIS — R7989 Other specified abnormal findings of blood chemistry: Secondary | ICD-10-CM

## 2022-08-27 DIAGNOSIS — M25562 Pain in left knee: Secondary | ICD-10-CM

## 2022-08-27 DIAGNOSIS — D649 Anemia, unspecified: Secondary | ICD-10-CM

## 2022-08-27 DIAGNOSIS — M25461 Effusion, right knee: Secondary | ICD-10-CM | POA: Diagnosis not present

## 2022-08-27 DIAGNOSIS — M25462 Effusion, left knee: Secondary | ICD-10-CM | POA: Diagnosis not present

## 2022-08-27 LAB — CBC WITH DIFFERENTIAL/PLATELET
Basophils Absolute: 0 10*3/uL (ref 0.0–0.1)
Basophils Relative: 0.4 % (ref 0.0–3.0)
Eosinophils Absolute: 0.1 10*3/uL (ref 0.0–0.7)
Eosinophils Relative: 2.2 % (ref 0.0–5.0)
HCT: 33 % — ABNORMAL LOW (ref 36.0–46.0)
Hemoglobin: 10.9 g/dL — ABNORMAL LOW (ref 12.0–15.0)
Lymphocytes Relative: 36.7 % (ref 12.0–46.0)
Lymphs Abs: 1.2 10*3/uL (ref 0.7–4.0)
MCHC: 33.1 g/dL (ref 30.0–36.0)
MCV: 92.6 fl (ref 78.0–100.0)
Monocytes Absolute: 0.3 10*3/uL (ref 0.1–1.0)
Monocytes Relative: 9.1 % (ref 3.0–12.0)
Neutro Abs: 1.7 10*3/uL (ref 1.4–7.7)
Neutrophils Relative %: 51.6 % (ref 43.0–77.0)
Platelets: 231 10*3/uL (ref 150.0–400.0)
RBC: 3.57 Mil/uL — ABNORMAL LOW (ref 3.87–5.11)
RDW: 13.6 % (ref 11.5–15.5)
WBC: 3.3 10*3/uL — ABNORMAL LOW (ref 4.0–10.5)

## 2022-08-27 MED ORDER — DICLOFENAC SODIUM 1 % EX GEL: 2.0000 g | Freq: Three times a day (TID) | CUTANEOUS | 0 refills | Status: DC | PRN

## 2022-08-27 NOTE — Progress Notes (Signed)
Subjective:    Patient ID: [name] [name], female    DOB: [birth year], 88 y.o.   MRN: [id number]  HPI  [name] [name] is a very pleasant 88 y.o. female with a history of neck pain, shoulder pain, chronic fatigue, hip pain who presents today to discuss knee pain  Her pain originates to the bilateral knees which began about 2-3 years ago. Her pain will radiate up to the mid thigh and down to her toes. Her right knee pain is worse than left. Her pain is worse when rising after a seated position and when straightening her legs at night.   She denies falls, knee swelling, erythema. She denies a known history of osteoarthritis to her knees or gout. She is taking gabapentin 100 mg BID for her chronic foot pain but is causes drowsiness. She has a prescription for Meloxicam for which she's not taken.    Review of Systems  Musculoskeletal:  Positive for arthralgias.       Chronic knee pain  Skin:  Negative for color change.  Neurological:  Positive for numbness.         Past Medical History:  Diagnosis Date   Acute bilateral thoracic back pain 08/16/2019   Allergy    Cipro, Plavix, Statins   CAD (coronary artery disease)    a. s/p tandem Promus DES to LAD in 2009 by Dr. Olevia Perches;  b.  LHC (03/22/14):  prox LAD 30%, mid LAD 40%, LAD stent ok with dist 20% ISR, apical LAD occluded with L-L collats filling apical vessel (too small for PCI), mid RCA 20%, EF 70%.  Med Rx   Diabetes mellitus    Diagnosed 2012   Diverticulosis of colon (without mention of hemorrhage)    GERD (gastroesophageal reflux disease)    Herpes zoster without complication 5/63/1497   Hx of cardiovascular stress test    a. Nuclear (08/2013):  No ischemia, EF 80%, Normal   Hx of echocardiogram    a. Echo (07/2013):  Mild LVH, vigorous LVF, EF 65-70%, Gr 1 DD, mild MR, mild to mod LAE   Hyperlipidemia    intol of statins   Hypertension    NHL (non-Hodgkin's lymphoma) ([place]) dx'd 2003   chemo/xrt comp 2003   Personal history of  colonic polyps    Proctitis    Pruritic intertrigo 07/18/2014   Urticaria     Social History   Socioeconomic History   Marital status: Widowed    Spouse name: Not on file   Number of children: Not on file   Years of education: Not on file   Highest education level: Not on file  Occupational History   Occupation: Retired    Fish farm manager: RETIRED  Tobacco Use   Smoking status: Never   Smokeless tobacco: Never  Vaping Use   Vaping Use: Never used  Substance and Sexual Activity   Alcohol use: No   Drug use: No   Sexual activity: Not Currently  Other Topics Concern   Not on file  Social History Narrative   Not on file   Social Determinants of Health   Financial Resource Strain: Low Risk  (06/25/2022)   Overall Financial Resource Strain (CARDIA)    Difficulty of Paying Living Expenses: Not hard at all  Food Insecurity: No Food Insecurity (06/25/2022)   Hunger Vital Sign    Worried About Running Out of Food in the Last Year: Never true    [place] in the Last Year: Never true  Transportation Needs: No Transportation Needs (06/25/2022)   PRAPARE - Hydrologist (Medical): No    Lack of Transportation (Non-Medical): No  Physical Activity: Inactive (06/25/2022)   Exercise Vital Sign    Days of Exercise per Week: 0 days    Minutes of Exercise per Session: 0 min  Stress: No Stress Concern Present (06/25/2022)   [place]    Feeling of Stress : Not at all  Social Connections: Not on file  Intimate Partner Violence: Not At Risk (06/23/2021)   Humiliation, Afraid, Rape, and Kick questionnaire    Fear of Current or Ex-Partner: No    Emotionally Abused: No    Physically Abused: No    Sexually Abused: No    Past Surgical History:  Procedure Laterality Date   cardiac cath-neg     CORONARY ANGIOPLASTY WITH STENT PLACEMENT     x 2   dexa-neg     KNEE SURGERY  10/2003   left   laser  surgery for cataracts-left     LEFT HEART CATHETERIZATION WITH CORONARY ANGIOGRAM N/A 03/22/2014   Procedure: LEFT HEART CATHETERIZATION WITH CORONARY ANGIOGRAM;  Surgeon: Burnell Blanks, MD;  Location: [facility] CATH LAB;  Service: Cardiovascular;  Laterality: N/A;   stress cardiolite     VAGINAL HYSTERECTOMY     partial, fibroids, one ovary left    Family History  Problem Relation Age of Onset   Cancer Mother        jaw   Hypertension Father    Heart attack Father    Heart attack Sister    Colon cancer Neg Hx     Allergies  Allergen Reactions   Other Rash and Anaphylaxis   Shellfish Allergy Anaphylaxis and Rash   Cephalexin     REACTION: trash and swelling REACTION: trash and swelling   Ciprofloxacin     REACTION: u/k   Clopidogrel Bisulfate     REACTION: swelling, rash   Codeine Phosphate     REACTION: unspecified   Ezetimibe-Simvastatin     REACTION: myalgias REACTION: myalgias   Hydrocod Poli-Chlorphe Poli Er     REACTION: u/k   Lidocaine Hives    ONLY TO ADHESIVE LIDOCAINE PATCHES. NO PROBLEM WITH INJECTABLE LIDOCAINE.   Nitrofurantoin     REACTION: [place] REACTION: [place]   Pregabalin     REACTION: felt bad REACTION: felt bad   Propoxyphene N-Acetaminophen     REACTION: [place]   Rofecoxib     unk unk    Sulfamethoxazole     REACTION: unspecified   Sulfonamide Derivatives     REACTION: unspecified   Celecoxib Rash    REACTION: [place] REACTION: [place]   Codeine Rash    REACTION: [place] REACTION: [place]   Hydrocod Poli-Chlorphe Poli Er Rash   Propoxyphene Rash   Sulfa Antibiotics Rash    REACTION: unspecified REACTION: unspecified    Current Outpatient Medications on File Prior to Visit  Medication Sig Dispense Refill   aspirin 81 MG tablet Take 81 mg by mouth daily.     Bempedoic Acid-Ezetimibe (NEXLIZET) 180-10 MG TABS Take 1 tablet by mouth daily. 30 tablet 11   Blood Glucose Calibration (TRUE METRIX LEVEL 1) Low SOLN Use to calibrate meter 3 each 0   Blood Glucose  Monitoring Suppl (TRUE METRIX METER) DEVI Test blood sugars once or twice daily for diabetes. 1 each 0   brimonidine-timolol (COMBIGAN) 0.2-0.5 % ophthalmic solution Place 1 drop  into both eyes every 12 (twelve) hours.     Cholecalciferol (VITAMIN D-3) 1000 UNITS CAPS Take 1 capsule by mouth daily.     Continuous Blood Gluc Receiver (FREESTYLE LIBRE 2 READER) DEVI Use to check blood sugars. 1 each 0   Continuous Blood Gluc Sensor (FREESTYLE LIBRE 2 SENSOR) MISC Apply every 14 days to check blood sugars. 6 each 1   gabapentin (NEURONTIN) 100 MG capsule Take 100 mg by mouth 2 (two) times daily.     glipiZIDE (GLUCOTROL XL) 10 MG 24 hr tablet TAKE ONE TABLET BY MOUTH EVERY MORNING WITH BREAKFAST FOR DIABETES 90 tablet 1   glucose blood (TRUE METRIX BLOOD GLUCOSE TEST) test strip TEST BLOOD SUGAR ONCE TO TWICE DAILY FOR DIABETES. 200 strip 1   isosorbide mononitrate (IMDUR) 60 MG 24 hr tablet Take 1.5 tablets (90 mg total) by mouth daily. 135 tablet 3   metoprolol succinate (TOPROL-XL) 100 MG 24 hr tablet TAKE 1 TABLET EVERY MORNING WITH OR IMMEDIATELY FOLLOWING A MEAL (NEED MD APPOINTMENT) 90 tablet 0   nitroGLYCERIN (NITROSTAT) 0.4 MG SL tablet Place 1 tablet (0.4 mg total) under the tongue every 5 (five) minutes as needed for chest pain. 26 tablet 0   pantoprazole (PROTONIX) 20 MG tablet TAKE ONE TABLET BY MOUTH DAILY FOR HEARTBURN 90 tablet 0   sertraline (ZOLOFT) 100 MG tablet TAKE 1 TABLET BY MOUTH DAILY FOR ANIXETY AND DEPRESSION 90 tablet 0   timolol (TIMOPTIC) 0.5 % ophthalmic solution      TRUEplus Lancets 33G MISC TEST BLOOD SUGAR ONCE TO TWICE DAILY FOR DIABETES. 200 each 3   vitamin B-12 (CYANOCOBALAMIN) 1000 MCG tablet Take 1,000 mcg by mouth daily.     VYZULTA 0.024 % SOLN Apply 1 drop to eye at bedtime.     ezetimibe (ZETIA) 10 MG tablet Take 10 mg by mouth daily. (Patient not taking: Reported on 08/27/2022)     fexofenadine (ALLEGRA) 180 MG tablet Take 1 tablet (180 mg total) by mouth  as needed for allergies or rhinitis. (Patient not taking: Reported on 08/27/2022) 90 tablet 0   [DISCONTINUED] loratadine (CLARITIN) 10 MG tablet Take 10 mg by mouth as needed.      No current facility-administered medications on file prior to visit.    BP 116/60   Pulse 60   Temp (!) 97.2 F (36.2 C) (Temporal)   Ht '5\' 9"'$  (1.753 m)   Wt 166 lb (75.3 kg)   SpO2 98%   BMI 24.51 kg/m  Objective:   Physical Exam Constitutional:      General: She is not in acute distress. Pulmonary:     Effort: Pulmonary effort is normal.  Musculoskeletal:     Right knee: No swelling or bony tenderness. Decreased range of motion. No tenderness.       Legs:     Comments: Ambulates well with cane.  Able to get up and down from exam tablet without difficulty.  5/5 strength to bilateral lower extremities. Mild decrease in ROM to right knee with extension.   Skin:    General: Skin is warm and dry.     Findings: No erythema.           Assessment & Plan:   Problem List Items Addressed This Visit       Other   Chronic pain of both knees - Primary    Suspect osteoarthritis.   No known history of gout and has tested negative previously. No apparent prior testing for RA  within chart, consider testing once xrays return.  Rx for Voltaren Gel sent to pharmacy for TID PRN. Knee sleeve recommended. Follow up with sports medicine.       Relevant Medications   diclofenac Sodium (VOLTAREN ARTHRITIS PAIN) 1 % GEL   Other Relevant Orders   DG Knee 3 Views Left   DG Knee 3 Views Right       Pleas Koch, NP

## 2022-08-27 NOTE — Assessment & Plan Note (Signed)
Suspect osteoarthritis.   No known history of gout and has tested negative previously. No apparent prior testing for RA within chart, consider testing once xrays return.  Rx for Voltaren Gel sent to pharmacy for TID PRN. Knee sleeve recommended. Follow up with sports medicine.

## 2022-08-27 NOTE — Patient Instructions (Signed)
Complete xray(s) prior to leaving today. I will notify you of your results once received.  You can apply the diclofenac (Voltaren) gel three times daily to your knees as needed for pain.  Wear your knee brace as discussed.   Schedule a visit with Dr. [name] for your knees.   It was a pleasure to see you today!

## 2022-08-30 DIAGNOSIS — H401133 Primary open-angle glaucoma, bilateral, severe stage: Secondary | ICD-10-CM | POA: Diagnosis not present

## 2022-08-30 LAB — PATHOLOGIST SMEAR REVIEW

## 2022-08-31 ENCOUNTER — Other Ambulatory Visit (INDEPENDENT_AMBULATORY_CARE_PROVIDER_SITE_OTHER): Payer: Medicare PPO | Admitting: Radiology

## 2022-08-31 DIAGNOSIS — D649 Anemia, unspecified: Secondary | ICD-10-CM

## 2022-09-01 ENCOUNTER — Telehealth: Payer: Self-pay | Admitting: Radiology

## 2022-09-01 LAB — FECAL OCCULT BLOOD, IMMUNOCHEMICAL: Fecal Occult Bld: POSITIVE — AB

## 2022-09-01 NOTE — Telephone Encounter (Signed)
Noted, see result note  

## 2022-09-01 NOTE — Telephone Encounter (Signed)
[facility] called a positive ifob result. Result given to Allie Bossier

## 2022-09-02 ENCOUNTER — Other Ambulatory Visit: Payer: Self-pay | Admitting: Primary Care

## 2022-09-02 DIAGNOSIS — R195 Other fecal abnormalities: Secondary | ICD-10-CM

## 2022-09-02 DIAGNOSIS — D649 Anemia, unspecified: Secondary | ICD-10-CM

## 2022-09-06 ENCOUNTER — Encounter: Payer: Self-pay | Admitting: Nurse Practitioner

## 2022-09-08 ENCOUNTER — Other Ambulatory Visit: Payer: Self-pay | Admitting: Primary Care

## 2022-09-08 ENCOUNTER — Other Ambulatory Visit: Payer: Self-pay | Admitting: Podiatry

## 2022-09-08 NOTE — Telephone Encounter (Signed)
Received refill request for oxybutynin for overactive bladder. Vida Roller, you discontinued this medication on her list in July 2023. Is she still taking this?

## 2022-09-08 NOTE — Telephone Encounter (Signed)
Spoke with patient and confirmed she is not taking this medication.

## 2022-09-13 ENCOUNTER — Other Ambulatory Visit: Payer: Self-pay | Admitting: Cardiovascular Disease

## 2022-09-17 ENCOUNTER — Ambulatory Visit: Payer: Medicare PPO | Admitting: Podiatry

## 2022-09-17 DIAGNOSIS — M79674 Pain in right toe(s): Secondary | ICD-10-CM

## 2022-09-17 DIAGNOSIS — M7752 Other enthesopathy of left foot: Secondary | ICD-10-CM

## 2022-09-17 DIAGNOSIS — M7751 Other enthesopathy of right foot: Secondary | ICD-10-CM

## 2022-09-17 DIAGNOSIS — B351 Tinea unguium: Secondary | ICD-10-CM

## 2022-09-17 DIAGNOSIS — M79675 Pain in left toe(s): Secondary | ICD-10-CM

## 2022-09-19 NOTE — Progress Notes (Unsigned)
    [name] Haydu T. Ghada Abbett, MD, [place] at [facility] [place], [address]  Phone: [phone]  FAX: [place] - 88 y.o. female  MRN [id number]  Date of Birth: 03/05/1932  Date: 09/20/2022  PCP: Pleas Koch, NP  Referral: Pleas Koch, NP  No chief complaint on file.  Subjective:   [name] [name] is a 88 y.o. very pleasant female patient with There is no height or weight on file to calculate BMI. who presents with the following:  Pleasant lady with known B knee OA presents today for evaluation and suggestions.     Review of Systems is noted in the HPI, as appropriate  Objective:   There were no vitals taken for this visit.  GEN: No acute distress; alert,appropriate. PULM: Breathing comfortably in no respiratory distress PSYCH: Normally interactive.   Laboratory and Imaging Data:  Assessment and Plan:   ***

## 2022-09-20 ENCOUNTER — Ambulatory Visit: Payer: Medicare PPO | Admitting: Family Medicine

## 2022-09-20 ENCOUNTER — Encounter: Payer: Self-pay | Admitting: Family Medicine

## 2022-09-20 VITALS — BP 150/68 | HR 59 | Temp 98.1°F | Ht 69.0 in | Wt 167.4 lb

## 2022-09-20 DIAGNOSIS — M79675 Pain in left toe(s): Secondary | ICD-10-CM | POA: Diagnosis not present

## 2022-09-20 DIAGNOSIS — B351 Tinea unguium: Secondary | ICD-10-CM | POA: Diagnosis not present

## 2022-09-20 DIAGNOSIS — M25562 Pain in left knee: Secondary | ICD-10-CM | POA: Diagnosis not present

## 2022-09-20 DIAGNOSIS — M170 Bilateral primary osteoarthritis of knee: Secondary | ICD-10-CM | POA: Diagnosis not present

## 2022-09-20 DIAGNOSIS — M7751 Other enthesopathy of right foot: Secondary | ICD-10-CM | POA: Diagnosis not present

## 2022-09-20 DIAGNOSIS — G8929 Other chronic pain: Secondary | ICD-10-CM

## 2022-09-20 DIAGNOSIS — M79674 Pain in right toe(s): Secondary | ICD-10-CM | POA: Diagnosis not present

## 2022-09-20 DIAGNOSIS — M25561 Pain in right knee: Secondary | ICD-10-CM | POA: Diagnosis not present

## 2022-09-20 DIAGNOSIS — M7752 Other enthesopathy of left foot: Secondary | ICD-10-CM | POA: Diagnosis not present

## 2022-09-20 MED ORDER — TRIAMCINOLONE ACETONIDE 40 MG/ML IJ SUSP
40.0000 mg | Freq: Once | INTRAMUSCULAR | Status: AC
  Administered 2022-09-20: 40 mg via INTRA_ARTICULAR

## 2022-09-20 MED ORDER — BETAMETHASONE SOD PHOS & ACET 6 (3-3) MG/ML IJ SUSP
3.0000 mg | Freq: Once | INTRAMUSCULAR | Status: AC
  Administered 2022-09-20: 3 mg via INTRA_ARTICULAR

## 2022-09-20 NOTE — Addendum Note (Signed)
Addended by: Carter Kitten on: 09/20/2022 10:54 AM   Modules accepted: Orders

## 2022-09-20 NOTE — Progress Notes (Signed)
   Chief Complaint  Patient presents with   Foot Pain    Patient is here for bilateral foot pain.patient states that she is still having pain.    SUBJECTIVE Patient with a history of diabetes mellitus presents to office today complaining of elongated, thickened nails that cause pain while ambulating in shoes.  Patient is unable to trim their own nails.  Patient states that injection she received last time to the second toe joint helped significantly for several months.  She is experiencing pain to both of the second toes bilaterally today.  She is requesting injection today.   Past Medical History:  Diagnosis Date   Acute bilateral thoracic back pain 08/16/2019   Allergy    Cipro, Plavix, Statins   CAD (coronary artery disease)    a. s/p tandem Promus DES to LAD in 2009 by Dr. Olevia Perches;  b.  LHC (03/22/14):  prox LAD 30%, mid LAD 40%, LAD stent ok with dist 20% ISR, apical LAD occluded with L-L collats filling apical vessel (too small for PCI), mid RCA 20%, EF 70%.  Med Rx   Diabetes mellitus    Diagnosed 2012   Diverticulosis of colon (without mention of hemorrhage)    GERD (gastroesophageal reflux disease)    Herpes zoster without complication 08/22/2682   Hx of cardiovascular stress test    a. Nuclear (08/2013):  No ischemia, EF 80%, Normal   Hx of echocardiogram    a. Echo (07/2013):  Mild LVH, vigorous LVF, EF 65-70%, Gr 1 DD, mild MR, mild to mod LAE   Hyperlipidemia    intol of statins   Hypertension    NHL (non-Hodgkin's lymphoma) ([place]) dx'd 2003   chemo/xrt comp 2003   Personal history of colonic polyps    Proctitis    Pruritic intertrigo 07/18/2014   Urticaria     OBJECTIVE General Patient is awake, alert, and oriented x 3 and in no acute distress. Derm Skin is dry and supple bilateral. Negative open lesions or macerations. Remaining integument unremarkable. Nails are tender, long, thickened and dystrophic with subungual debris, consistent with onychomycosis, 1-5  bilateral. No signs of infection noted. Vasc  DP and PT pedal pulses palpable bilaterally. Temperature gradient within normal limits.  Neuro Epicritic and protective threshold sensation diminished bilaterally.  Musculoskeletal Exam No symptomatic pedal deformities noted bilateral. Muscular strength within normal limits.  Pain on palpation range of motion noted to the second MTP joint bilateral  ASSESSMENT 1. Diabetes Mellitus w/ peripheral neuropathy 2.  Pain due to onychomycosis of toenails bilateral 3.  Second MTP capsulitis bilateral  PLAN OF CARE 1. Patient evaluated today. 2. Instructed to maintain good pedal hygiene and foot care. Stressed importance of controlling blood sugar.  3. Mechanical debridement of nails 1-5 bilaterally performed using a nail nipper. Filed with dremel without incident.  4.  Injection of 0.5 cc Celestone Soluspan injection of the second MTP joint bilateral 5.  Return to clinic in 3 mos.     Edrick Kins, DPM [facility]  Dr. Edrick Kins, DPM    [address], [place] [address]                Office [phone]  Fax [phone]

## 2022-09-29 ENCOUNTER — Encounter: Payer: Self-pay | Admitting: Primary Care

## 2022-09-29 ENCOUNTER — Ambulatory Visit: Payer: Medicare PPO | Admitting: Primary Care

## 2022-09-29 DIAGNOSIS — R0982 Postnasal drip: Secondary | ICD-10-CM | POA: Diagnosis not present

## 2022-09-29 NOTE — Assessment & Plan Note (Signed)
Suspect symptoms are secondary to postnasal drip from environmental/weather changes. Exam today grossly benign.  She does not appear acutely ill.  Start Allegra or Claritin daily. She will follow-up with her eye doctor later this week.  Continue pantoprazole 20 mg daily as prescribed. Continue vitamin B12 supplements.  She will update in 1 week.

## 2022-09-29 NOTE — Progress Notes (Signed)
Subjective:    Patient ID: [name] [name], female    DOB: 1932/03/14, 88 y.o.   MRN: [id number]  HPI  [name] [name] is a very pleasant 88 y.o. female with a history of hypertension, CAD, GERD, type 2 diabetes, chronic fatigue who presents today to discuss   Symptom onset 10 days ago with post nasal drip. She then developed mucous development in the back of her throat. Her mucous is a yellow color. She's also developed a burning sensation to her tongue. She used her mouthwash this morning which caused increased burning.   She began a new combination eye drop about 3 weeks ago, questions if her symptoms are secondary. She also had her false teeth readjusted about 4 weeks ago, she questions if the burning to her tongue is secondary.   She denies cough, fevers, body aches, fevers, esophageal burning. She is compliant to her pantoprazole. She does not take antihistamines, but does have Allegra and Claritin at home. She feels well overall. She takes her vitamin B12 daily.   Review of Systems  Constitutional:  Negative for chills and fever.  HENT:  Positive for congestion and postnasal drip. Negative for sore throat.   Respiratory:  Negative for cough and shortness of breath.          Past Medical History:  Diagnosis Date   Acute bilateral thoracic back pain 08/16/2019   Allergy    Cipro, Plavix, Statins   CAD (coronary artery disease)    a. s/p tandem Promus DES to LAD in 2009 by Dr. Olevia Perches;  b.  LHC (03/22/14):  prox LAD 30%, mid LAD 40%, LAD stent ok with dist 20% ISR, apical LAD occluded with L-L collats filling apical vessel (too small for PCI), mid RCA 20%, EF 70%.  Med Rx   Diabetes mellitus    Diagnosed 2012   Diverticulosis of colon (without mention of hemorrhage)    GERD (gastroesophageal reflux disease)    Herpes zoster without complication 03/07/8118   Hx of cardiovascular stress test    a. Nuclear (08/2013):  No ischemia, EF 80%, Normal   Hx of echocardiogram    a. Echo  (07/2013):  Mild LVH, vigorous LVF, EF 65-70%, Gr 1 DD, mild MR, mild to mod LAE   Hyperlipidemia    intol of statins   Hypertension    NHL (non-Hodgkin's lymphoma) ([place]) dx'd 2003   chemo/xrt comp 2003   Personal history of colonic polyps    Proctitis    Pruritic intertrigo 07/18/2014   Urticaria     Social History   Socioeconomic History   Marital status: Widowed    Spouse name: Not on file   Number of children: Not on file   Years of education: Not on file   Highest education level: Not on file  Occupational History   Occupation: Retired    Fish farm manager: RETIRED  Tobacco Use   Smoking status: Never   Smokeless tobacco: Never  Vaping Use   Vaping Use: Never used  Substance and Sexual Activity   Alcohol use: No   Drug use: No   Sexual activity: Not Currently  Other Topics Concern   Not on file  Social History Narrative   Not on file   Social Determinants of Health   Financial Resource Strain: Low Risk  (06/25/2022)   Overall Financial Resource Strain (CARDIA)    Difficulty of Paying Living Expenses: Not hard at all  Food Insecurity: No Food Insecurity (06/25/2022)   Hunger Vital  Sign    Worried About Charity fundraiser in the Last Year: Never true    [place] in the Last Year: Never true  Transportation Needs: No Transportation Needs (06/25/2022)   PRAPARE - Hydrologist (Medical): No    Lack of Transportation (Non-Medical): No  Physical Activity: Inactive (06/25/2022)   Exercise Vital Sign    Days of Exercise per Week: 0 days    Minutes of Exercise per Session: 0 min  Stress: No Stress Concern Present (06/25/2022)   [place]    Feeling of Stress : Not at all  Social Connections: Not on file  Intimate Partner Violence: Not At Risk (06/23/2021)   Humiliation, Afraid, Rape, and Kick questionnaire    Fear of Current or Ex-Partner: No    Emotionally Abused: No     Physically Abused: No    Sexually Abused: No    Past Surgical History:  Procedure Laterality Date   cardiac cath-neg     CORONARY ANGIOPLASTY WITH STENT PLACEMENT     x 2   dexa-neg     KNEE SURGERY  10/2003   left   laser surgery for cataracts-left     LEFT HEART CATHETERIZATION WITH CORONARY ANGIOGRAM N/A 03/22/2014   Procedure: LEFT HEART CATHETERIZATION WITH CORONARY ANGIOGRAM;  Surgeon: Burnell Blanks, MD;  Location: [facility] CATH LAB;  Service: Cardiovascular;  Laterality: N/A;   stress cardiolite     VAGINAL HYSTERECTOMY     partial, fibroids, one ovary left    Family History  Problem Relation Age of Onset   Cancer Mother        jaw   Hypertension Father    Heart attack Father    Heart attack Sister    Colon cancer Neg Hx     Allergies  Allergen Reactions   Other Rash and Anaphylaxis   Shellfish Allergy Anaphylaxis and Rash   Cephalexin     REACTION: trash and swelling REACTION: trash and swelling   Ciprofloxacin     REACTION: u/k   Clopidogrel Bisulfate     REACTION: swelling, rash   Codeine Phosphate     REACTION: unspecified   Ezetimibe-Simvastatin     REACTION: myalgias REACTION: myalgias   Hydrocod Poli-Chlorphe Poli Er     REACTION: u/k   Lidocaine Hives    ONLY TO ADHESIVE LIDOCAINE PATCHES. NO PROBLEM WITH INJECTABLE LIDOCAINE.   Nitrofurantoin     REACTION: [place] REACTION: [place]   Pregabalin     REACTION: felt bad REACTION: felt bad   Propoxyphene N-Acetaminophen     REACTION: [place]   Rofecoxib     unk unk    Sulfamethoxazole     REACTION: unspecified   Sulfonamide Derivatives     REACTION: unspecified   Celecoxib Rash    REACTION: [place] REACTION: [place]   Codeine Rash    REACTION: [place] REACTION: [place]   Hydrocod Poli-Chlorphe Poli Er Rash   Propoxyphene Rash   Sulfa Antibiotics Rash    REACTION: unspecified REACTION: unspecified    Current Outpatient Medications on File Prior to Visit  Medication Sig Dispense Refill   aspirin 81 MG tablet  Take 81 mg by mouth daily.     Bempedoic Acid-Ezetimibe (NEXLIZET) 180-10 MG TABS Take 1 tablet by mouth daily. 30 tablet 11   Blood Glucose Calibration (TRUE METRIX LEVEL 1) Low SOLN Use to calibrate meter 3 each 0   Blood  Glucose Monitoring Suppl (TRUE METRIX METER) DEVI Test blood sugars once or twice daily for diabetes. 1 each 0   brimonidine-timolol (COMBIGAN) 0.2-0.5 % ophthalmic solution Place 1 drop into both eyes every 12 (twelve) hours.     Cholecalciferol (VITAMIN D-3) 1000 UNITS CAPS Take 1 capsule by mouth daily.     diclofenac Sodium (VOLTAREN ARTHRITIS PAIN) 1 % GEL Apply 2 g topically 3 (three) times daily as needed. 100 g 0   dorzolamide-timolol (COSOPT) 2-0.5 % ophthalmic solution SMARTSIG:In Eye(s)     ezetimibe (ZETIA) 10 MG tablet Take 10 mg by mouth daily.     fexofenadine (ALLEGRA) 180 MG tablet Take 1 tablet (180 mg total) by mouth as needed for allergies or rhinitis. 90 tablet 0   gabapentin (NEURONTIN) 100 MG capsule Take 100 mg by mouth 2 (two) times daily.     glipiZIDE (GLUCOTROL XL) 10 MG 24 hr tablet TAKE ONE TABLET BY MOUTH EVERY MORNING WITH BREAKFAST FOR DIABETES 90 tablet 1   glucose blood (TRUE METRIX BLOOD GLUCOSE TEST) test strip TEST BLOOD SUGAR ONCE TO TWICE DAILY FOR DIABETES. 200 strip 1   isosorbide mononitrate (IMDUR) 60 MG 24 hr tablet Take 1.5 tablets (90 mg total) by mouth daily. 135 tablet 3   metoprolol succinate (TOPROL-XL) 100 MG 24 hr tablet TAKE ONE TABLET BY MOUTH EVERY MORNING WITH OR IMMEDIATELY FOLLOWING A MEAL. Please contact the office to schedule appointment for additional refills. 1st attempt. 30 tablet 0   nitroGLYCERIN (NITROSTAT) 0.4 MG SL tablet Place 1 tablet (0.4 mg total) under the tongue every 5 (five) minutes as needed for chest pain. 26 tablet 0   pantoprazole (PROTONIX) 20 MG tablet TAKE ONE TABLET BY MOUTH DAILY FOR HEARTBURN 90 tablet 0   sertraline (ZOLOFT) 100 MG tablet TAKE 1 TABLET BY MOUTH DAILY FOR ANIXETY AND DEPRESSION  90 tablet 0   timolol (TIMOPTIC) 0.5 % ophthalmic solution      TRUEplus Lancets 33G MISC TEST BLOOD SUGAR ONCE TO TWICE DAILY FOR DIABETES. 200 each 3   vitamin B-12 (CYANOCOBALAMIN) 1000 MCG tablet Take 1,000 mcg by mouth daily.     VYZULTA 0.024 % SOLN Apply 1 drop to eye at bedtime.     Continuous Blood Gluc Receiver (FREESTYLE LIBRE 2 READER) DEVI Use to check blood sugars. (Patient not taking: Reported on 09/29/2022) 1 each 0   Continuous Blood Gluc Sensor (FREESTYLE LIBRE 2 SENSOR) MISC Apply every 14 days to check blood sugars. (Patient not taking: Reported on 09/29/2022) 6 each 1   [DISCONTINUED] loratadine (CLARITIN) 10 MG tablet Take 10 mg by mouth as needed.      No current facility-administered medications on file prior to visit.    BP 122/68   Pulse 60   Temp 97.9 F (36.6 C) (Temporal)   Ht '5\' 9"'$  (1.753 m)   Wt 158 lb (71.7 kg)   SpO2 95%   BMI 23.33 kg/m  Objective:   Physical Exam HENT:     Right Ear: Tympanic membrane and ear canal normal.     Left Ear: Tympanic membrane and ear canal normal.     Nose:     Right Sinus: No maxillary sinus tenderness or frontal sinus tenderness.     Left Sinus: No maxillary sinus tenderness or frontal sinus tenderness.     Mouth/Throat:     Mouth: Mucous membranes are moist.     Tongue: No lesions.     Pharynx: Oropharynx is clear. No oropharyngeal exudate  or posterior oropharyngeal erythema.     Tonsils: No tonsillar exudate.     Comments: No thrush or other abnormality noted to tongue or oral cavity Eyes:     Conjunctiva/sclera: Conjunctivae normal.  Cardiovascular:     Rate and Rhythm: Normal rate and regular rhythm.  Pulmonary:     Effort: Pulmonary effort is normal.     Breath sounds: Normal breath sounds. No wheezing or rales.  Musculoskeletal:     Cervical back: Neck supple.  Lymphadenopathy:     Cervical: No cervical adenopathy.  Skin:    General: Skin is warm and dry.           Assessment & Plan:    Problem List Items Addressed This Visit       Other   Post-nasal drip    Suspect symptoms are secondary to postnasal drip from environmental/weather changes. Exam today grossly benign.  She does not appear acutely ill.  Start Allegra or Claritin daily. She will follow-up with her eye doctor later this week.  Continue pantoprazole 20 mg daily as prescribed. Continue vitamin B12 supplements.  She will update in 1 week.          Pleas Koch, NP

## 2022-10-01 DIAGNOSIS — H401133 Primary open-angle glaucoma, bilateral, severe stage: Secondary | ICD-10-CM | POA: Diagnosis not present

## 2022-10-07 DIAGNOSIS — E1122 Type 2 diabetes mellitus with diabetic chronic kidney disease: Secondary | ICD-10-CM | POA: Diagnosis not present

## 2022-10-08 ENCOUNTER — Ambulatory Visit: Payer: Medicare PPO | Admitting: Nurse Practitioner

## 2022-10-08 ENCOUNTER — Other Ambulatory Visit (INDEPENDENT_AMBULATORY_CARE_PROVIDER_SITE_OTHER): Payer: Medicare PPO

## 2022-10-08 ENCOUNTER — Encounter: Payer: Self-pay | Admitting: Nurse Practitioner

## 2022-10-08 VITALS — BP 105/66 | HR 70 | Ht 69.0 in | Wt 161.0 lb

## 2022-10-08 DIAGNOSIS — R195 Other fecal abnormalities: Secondary | ICD-10-CM

## 2022-10-08 DIAGNOSIS — D649 Anemia, unspecified: Secondary | ICD-10-CM

## 2022-10-08 LAB — IBC + FERRITIN
Ferritin: 20 ng/mL (ref 10.0–291.0)
Iron: 120 ug/dL (ref 42–145)
Saturation Ratios: 30.9 % (ref 20.0–50.0)
TIBC: 387.8 ug/dL (ref 250.0–450.0)
Transferrin: 277 mg/dL (ref 212.0–360.0)

## 2022-10-08 LAB — CBC
HCT: 34.3 % — ABNORMAL LOW (ref 36.0–46.0)
Hemoglobin: 11.5 g/dL — ABNORMAL LOW (ref 12.0–15.0)
MCHC: 33.6 g/dL (ref 30.0–36.0)
MCV: 91.4 fl (ref 78.0–100.0)
Platelets: 231 10*3/uL (ref 150.0–400.0)
RBC: 3.75 Mil/uL — ABNORMAL LOW (ref 3.87–5.11)
RDW: 14.5 % (ref 11.5–15.5)
WBC: 3.9 10*3/uL — ABNORMAL LOW (ref 4.0–10.5)

## 2022-10-08 NOTE — Patient Instructions (Signed)
_______________________________________________________  If you are age 86 or older, your body mass index should be between 23-30. Your Body mass index is 23.78 kg/m. If this is out of the aforementioned range listed, please consider follow up with your Primary Care Provider.  If you are age 41 or younger, your body mass index should be between 19-25. Your Body mass index is 23.78 kg/m. If this is out of the aformentioned range listed, please consider follow up with your Primary Care Provider.   ________________________________________________________  The [facility] providers would like to encourage you to use [facility] to communicate with providers for non-urgent requests or questions.  Due to long hold times on the telephone, sending your provider a message by [facility] may be a faster and more efficient way to get a response.  Please allow 48 business hours for a response.  Please remember that this is for non-urgent requests.  _______________________________________________________  Your provider has requested that you go to the basement level for lab work before leaving today. Press "B" on the elevator. The lab is located at the first door on the left as you exit the elevator.

## 2022-10-08 NOTE — Progress Notes (Signed)
Chief Complaint:  none   Assessment & Plan   # 88 yo female with FOBT+ [place] anemia. Baseline hgb 13, down to 10.9. No overt GI bleeding. No focal GI symptoms.  Update CBC, obtain iron studies. Discussed usual work up for FOBT+ anemia.  The risk and benefits of EGD and colonoscopy were discussed with the patient and her granddaughter today . Even though she is in relatively good health she is understandably hesitant to proceed with EGD / colonoscopy.  Will call her with labs results in a couple of days. In the interim she will think about how she wants to proceed  # GERD, controlled on daily PPI.   HPI:    [name] [name] is a 88 y.o. year old female followed years ago by Dr. Sharlett Iles with a past medical history of rectosigmoid lymphoma, glaucoma, arthritis, kidney stones , CAD s/p remote stent, DM2, HTN.  See PMH / [place] for additional history  Referred by PCP for FOBT+ anemia  Hgb trend 13.2 in June 2022 11.6  in Sept 2023 10.9  late Sept 2023   She hasn't seen any blood in her stool, no black stool. She has chronic, intermittent loose stool but this is unchanged. No N/V, or abdominal pain. Her weight is stable. Takes a daily good baby aspirin. Takes a PPI for GERD.   Last colonoscopy in 2010 showed proctitis   Previous Labs / Imaging::    Latest Ref Rng & Units 08/27/2022   11:02 AM 08/18/2022    9:41 AM 04/30/2021    2:51 PM  CBC  WBC 4.0 - 10.5 K/uL 3.3  3.3  5.2   Hemoglobin 12.0 - 15.0 g/dL 10.9  11.6  13.2   Hematocrit 36.0 - 46.0 % 33.0  35.5  39.0   Platelets 150.0 - 400.0 K/uL 231.0  230.0  197     Past Medical History:  Diagnosis Date   Acute bilateral thoracic back pain 08/16/2019   Allergy    Cipro, Plavix, Statins   CAD (coronary artery disease)    a. s/p tandem Promus DES to LAD in 2009 by Dr. Olevia Perches;  b.  LHC (03/22/14):  prox LAD 30%, mid LAD 40%, LAD stent ok with dist 20% ISR, apical LAD occluded with L-L collats filling apical vessel (too small for PCI),  mid RCA 20%, EF 70%.  Med Rx   Diabetes mellitus    Diagnosed 2012   Diverticulosis of colon (without mention of hemorrhage)    GERD (gastroesophageal reflux disease)    Herpes zoster without complication 2/97/9892   Hx of cardiovascular stress test    a. Nuclear (08/2013):  No ischemia, EF 80%, Normal   Hx of echocardiogram    a. Echo (07/2013):  Mild LVH, vigorous LVF, EF 65-70%, Gr 1 DD, mild MR, mild to mod LAE   Hyperlipidemia    intol of statins   Hypertension    NHL (non-Hodgkin's lymphoma) ([place]) dx'd 2003   chemo/xrt comp 2003   Personal history of colonic polyps    Proctitis    Pruritic intertrigo 07/18/2014   Urticaria    Past Surgical History:  Procedure Laterality Date   cardiac cath-neg     CORONARY ANGIOPLASTY WITH STENT PLACEMENT     x 2   dexa-neg     KNEE SURGERY  10/2003   left   laser surgery for cataracts-left     LEFT HEART CATHETERIZATION WITH CORONARY ANGIOGRAM N/A 03/22/2014   Procedure: LEFT HEART  CATHETERIZATION WITH CORONARY ANGIOGRAM;  Surgeon: Burnell Blanks, MD;  Location: [facility] CATH LAB;  Service: Cardiovascular;  Laterality: N/A;   stress cardiolite     VAGINAL HYSTERECTOMY     partial, fibroids, one ovary left   Family History  Problem Relation Age of Onset   Cancer Mother        jaw   Hypertension Father    Heart attack Father    Heart attack Sister    Colon cancer Neg Hx    Social History   Tobacco Use   Smoking status: Never   Smokeless tobacco: Never  Vaping Use   Vaping Use: Never used  Substance Use Topics   Alcohol use: No   Drug use: No   Current Outpatient Medications  Medication Sig Dispense Refill   aspirin 81 MG tablet Take 81 mg by mouth daily.     Bempedoic Acid-Ezetimibe (NEXLIZET) 180-10 MG TABS Take 1 tablet by mouth daily. 30 tablet 11   brimonidine-timolol (COMBIGAN) 0.2-0.5 % ophthalmic solution Place 1 drop into both eyes every 12 (twelve) hours.     Cholecalciferol (VITAMIN D-3) 1000 UNITS CAPS Take 1  capsule by mouth daily.     Continuous Blood Gluc Receiver (FREESTYLE LIBRE 2 READER) DEVI Use to check blood sugars. 1 each 0   Continuous Blood Gluc Sensor (FREESTYLE LIBRE 2 SENSOR) MISC Apply every 14 days to check blood sugars. 6 each 1   diclofenac Sodium (VOLTAREN ARTHRITIS PAIN) 1 % GEL Apply 2 g topically 3 (three) times daily as needed. 100 g 0   dorzolamide-timolol (COSOPT) 2-0.5 % ophthalmic solution SMARTSIG:In Eye(s)     ezetimibe (ZETIA) 10 MG tablet Take 10 mg by mouth daily.     fexofenadine (ALLEGRA) 180 MG tablet Take 1 tablet (180 mg total) by mouth as needed for allergies or rhinitis. 90 tablet 0   gabapentin (NEURONTIN) 100 MG capsule Take 100 mg by mouth 2 (two) times daily.     glipiZIDE (GLUCOTROL XL) 10 MG 24 hr tablet TAKE ONE TABLET BY MOUTH EVERY MORNING WITH BREAKFAST FOR DIABETES 90 tablet 1   isosorbide mononitrate (IMDUR) 60 MG 24 hr tablet Take 1.5 tablets (90 mg total) by mouth daily. 135 tablet 3   metoprolol succinate (TOPROL-XL) 100 MG 24 hr tablet TAKE ONE TABLET BY MOUTH EVERY MORNING WITH OR IMMEDIATELY FOLLOWING A MEAL. Please contact the office to schedule appointment for additional refills. 1st attempt. 30 tablet 0   nitroGLYCERIN (NITROSTAT) 0.4 MG SL tablet Place 1 tablet (0.4 mg total) under the tongue every 5 (five) minutes as needed for chest pain. 26 tablet 0   pantoprazole (PROTONIX) 20 MG tablet TAKE ONE TABLET BY MOUTH DAILY FOR HEARTBURN 90 tablet 0   sertraline (ZOLOFT) 100 MG tablet TAKE 1 TABLET BY MOUTH DAILY FOR ANIXETY AND DEPRESSION 90 tablet 0   timolol (TIMOPTIC) 0.5 % ophthalmic solution      TRUEplus Lancets 33G MISC TEST BLOOD SUGAR ONCE TO TWICE DAILY FOR DIABETES. 200 each 3   vitamin B-12 (CYANOCOBALAMIN) 1000 MCG tablet Take 1,000 mcg by mouth daily.     VYZULTA 0.024 % SOLN Apply 1 drop to eye at bedtime.     Blood Glucose Calibration (TRUE METRIX LEVEL 1) Low SOLN Use to calibrate meter (Patient not taking: Reported on  10/08/2022) 3 each 0   Blood Glucose Monitoring Suppl (TRUE METRIX METER) DEVI Test blood sugars once or twice daily for diabetes. (Patient not taking: Reported on 10/08/2022) 1  each 0   glucose blood (TRUE METRIX BLOOD GLUCOSE TEST) test strip TEST BLOOD SUGAR ONCE TO TWICE DAILY FOR DIABETES. (Patient not taking: Reported on 10/08/2022) 200 strip 1   No current facility-administered medications for this visit.   Allergies  Allergen Reactions   Other Rash and Anaphylaxis   Shellfish Allergy Anaphylaxis and Rash   Cephalexin     REACTION: trash and swelling REACTION: trash and swelling   Ciprofloxacin     REACTION: u/k   Clopidogrel Bisulfate     REACTION: swelling, rash   Codeine Phosphate     REACTION: unspecified   Ezetimibe-Simvastatin     REACTION: myalgias REACTION: myalgias   Hydrocod Poli-Chlorphe Poli Er     REACTION: u/k   Lidocaine Hives    ONLY TO ADHESIVE LIDOCAINE PATCHES. NO PROBLEM WITH INJECTABLE LIDOCAINE.   Nitrofurantoin     REACTION: [place] REACTION: [place]   Pregabalin     REACTION: felt bad REACTION: felt bad   Propoxyphene N-Acetaminophen     REACTION: [place]   Rofecoxib     unk unk    Sulfamethoxazole     REACTION: unspecified   Sulfonamide Derivatives     REACTION: unspecified   Celecoxib Rash    REACTION: [place] REACTION: [place]   Codeine Rash    REACTION: [place] REACTION: [place]   Hydrocod Poli-Chlorphe Poli Er Rash   Propoxyphene Rash   Sulfa Antibiotics Rash    REACTION: unspecified REACTION: unspecified     Review of Systems: Positive for arthritis, vision changes, hearing problems, shortness of breath, swelling of feet and legs.  All other systems reviewed and negative except where noted in HPI.   Wt Readings from Last 3 Encounters:  10/08/22 161 lb (73 kg)  09/29/22 158 lb (71.7 kg)  09/20/22 167 lb 6 oz (75.9 kg)    Physical Exam   Ht '5\' 9"'$  (1.753 m)   Wt 161 lb (73 kg)   BMI 23.78 kg/m  Constitutional:  Generally well appearing female in  no acute distress. Psychiatric: Pleasant. Normal mood and affect. Behavior is normal. EENT: Pupils normal.  Conjunctivae are normal. No scleral icterus. Neck supple.  Cardiovascular: Normal rate, regular rhythm. No edema Pulmonary/chest: Effort normal and breath sounds normal. No wheezing, rales or rhonchi. Abdominal: Soft, nondistended, nontender. Bowel sounds active throughout. There are no masses palpable. No hepatomegaly. Neurological: Alert and oriented to person place and time. Skin: Skin is warm and dry. No rashes noted.  Tye Savoy, NP  10/08/2022, 11:27 AM  Cc:  Referring Provider Pleas Koch, NP

## 2022-10-11 NOTE — Progress Notes (Signed)
Addendum: Reviewed and agree with assessment and management plan. Yoneko Talerico M, MD  

## 2022-10-12 ENCOUNTER — Other Ambulatory Visit: Payer: Self-pay | Admitting: Cardiovascular Disease

## 2022-10-12 ENCOUNTER — Other Ambulatory Visit: Payer: Self-pay | Admitting: Physician Assistant

## 2022-10-13 ENCOUNTER — Telehealth: Payer: Self-pay

## 2022-10-13 ENCOUNTER — Other Ambulatory Visit: Payer: Self-pay | Admitting: Primary Care

## 2022-10-13 DIAGNOSIS — Z6379 Other stressful life events affecting family and household: Secondary | ICD-10-CM

## 2022-10-13 DIAGNOSIS — K219 Gastro-esophageal reflux disease without esophagitis: Secondary | ICD-10-CM

## 2022-10-13 NOTE — Telephone Encounter (Signed)
Patient returning your call.

## 2022-10-14 ENCOUNTER — Other Ambulatory Visit: Payer: Self-pay | Admitting: Cardiovascular Disease

## 2022-10-14 NOTE — Telephone Encounter (Signed)
Left message for pt to call back. See 11/10 result notes.

## 2022-10-15 ENCOUNTER — Telehealth: Payer: Self-pay | Admitting: Primary Care

## 2022-10-15 NOTE — Telephone Encounter (Signed)
Sosia from [employer] call on a fax they sent over on medication FreeStyle Elenor Legato and wanted to make sure it was received. Call back number [phone]. Fax number [phone].

## 2022-10-15 NOTE — Telephone Encounter (Signed)
Called this number back and confirmed this is a scam.  Did not give out any patient information.

## 2022-10-19 DIAGNOSIS — D225 Melanocytic nevi of trunk: Secondary | ICD-10-CM | POA: Diagnosis not present

## 2022-10-19 DIAGNOSIS — L820 Inflamed seborrheic keratosis: Secondary | ICD-10-CM | POA: Diagnosis not present

## 2022-10-19 DIAGNOSIS — D2261 Melanocytic nevi of right upper limb, including shoulder: Secondary | ICD-10-CM | POA: Diagnosis not present

## 2022-10-19 DIAGNOSIS — L538 Other specified erythematous conditions: Secondary | ICD-10-CM | POA: Diagnosis not present

## 2022-10-19 DIAGNOSIS — R208 Other disturbances of skin sensation: Secondary | ICD-10-CM | POA: Diagnosis not present

## 2022-10-19 DIAGNOSIS — D2262 Melanocytic nevi of left upper limb, including shoulder: Secondary | ICD-10-CM | POA: Diagnosis not present

## 2022-10-19 DIAGNOSIS — L570 Actinic keratosis: Secondary | ICD-10-CM | POA: Diagnosis not present

## 2022-10-19 DIAGNOSIS — Z85828 Personal history of other malignant neoplasm of skin: Secondary | ICD-10-CM | POA: Diagnosis not present

## 2022-10-19 DIAGNOSIS — X32XXXA Exposure to sunlight, initial encounter: Secondary | ICD-10-CM | POA: Diagnosis not present

## 2022-10-20 NOTE — Telephone Encounter (Addendum)
Inbound call from patient inquiring about lab results. Please advise at [phone]  Thank you

## 2022-10-20 NOTE — Telephone Encounter (Signed)
Spoke with pt. See 11/10 result notes.

## 2022-10-27 ENCOUNTER — Other Ambulatory Visit: Payer: Self-pay | Admitting: Podiatry

## 2022-10-28 ENCOUNTER — Other Ambulatory Visit: Payer: Self-pay

## 2022-10-28 MED ORDER — FERROUS SULFATE 325 (65 FE) MG PO TABS: 325.0000 mg | ORAL_TABLET | Freq: Every day | ORAL | 2 refills | Status: AC

## 2022-10-31 NOTE — Progress Notes (Deleted)
4  No chief complaint on file.  History of Present Illness: 88 yo female with history of CAD, HLD, HTN, non-Hodgkin's lymphoma, DM here today for cardiac follow up. In 2009 she had non-overlapping drug-eluting stents placed in the LAD. Last cardiac cath April 2015 with patent LAD stents, occluded apical LAD segment with left to left collaterals and non-obstructive disease elsewhere. She has been intolerant to statins and Plavix in the past. She has been on Zetia. Echo 01/20/16 with normal LV size and function, trivial aortic valve insufficiency. She has had bilateral foot pain and normal ABI in 2016. Nuclear stress test October 2017 with no ischemia. She has stable angina controlled with Imdur.  Echo May 2023 with LVEF=60-65%, basal septal hypertrophy. Mild AI.   She is here today for follow up. The patient denies any chest pain, dyspnea, palpitations, lower extremity edema, orthopnea, PND, dizziness, near syncope or syncope.   Primary Care Physician: Pleas Koch, NP  Past Medical History:  Diagnosis Date   Acute bilateral thoracic back pain 08/16/2019   Allergy    Cipro, Plavix, Statins   Anemia    Arthritis    CAD (coronary artery disease)    a. s/p tandem Promus DES to LAD in 2009 by Dr. Olevia Perches;  b.  LHC (03/22/14):  prox LAD 30%, mid LAD 40%, LAD stent ok with dist 20% ISR, apical LAD occluded with L-L collats filling apical vessel (too small for PCI), mid RCA 20%, EF 70%.  Med Rx   Cancer [facility])    Colon polyps    Diabetes mellitus    Diagnosed 2012   Diverticulosis of colon (without mention of hemorrhage)    GERD (gastroesophageal reflux disease)    Glaucoma    Herpes zoster without complication 41/32/4401   Hx of cardiovascular stress test    a. Nuclear (08/2013):  No ischemia, EF 80%, Normal   Hx of echocardiogram    a. Echo (07/2013):  Mild LVH, vigorous LVF, EF 65-70%, Gr 1 DD, mild MR, mild to mod LAE   Hyperlipidemia    intol of statins   Hypertension    Kidney stones     NHL (non-Hodgkin's lymphoma) ([place]) dx'd 2003   chemo/xrt comp 2003   Personal history of colonic polyps    Proctitis    Pruritic intertrigo 07/18/2014   Urticaria     Past Surgical History:  Procedure Laterality Date   cardiac cath-neg     CORONARY ANGIOPLASTY WITH STENT PLACEMENT     x 2   dexa-neg     KNEE SURGERY  10/2003   left   laser surgery for cataracts-left     LEFT HEART CATHETERIZATION WITH CORONARY ANGIOGRAM N/A 03/22/2014   Procedure: LEFT HEART CATHETERIZATION WITH CORONARY ANGIOGRAM;  Surgeon: Burnell Blanks, MD;  Location: [facility] CATH LAB;  Service: Cardiovascular;  Laterality: N/A;   stress cardiolite     VAGINAL HYSTERECTOMY     partial, fibroids, one ovary left    Current Outpatient Medications  Medication Sig Dispense Refill   ferrous sulfate 325 (65 FE) MG tablet Take 1 tablet (325 mg total) by mouth daily with breakfast. 30 tablet 2   aspirin 81 MG tablet Take 81 mg by mouth daily.     Bempedoic Acid-Ezetimibe (NEXLIZET) 180-10 MG TABS Take 1 tablet by mouth daily. Please keep scheduled appointment for future refills. Thank you. 90 tablet 0   Blood Glucose Calibration (TRUE METRIX LEVEL 1) Low SOLN Use to calibrate meter (Patient not  taking: Reported on 10/08/2022) 3 each 0   Blood Glucose Monitoring Suppl (TRUE METRIX METER) DEVI Test blood sugars once or twice daily for diabetes. (Patient not taking: Reported on 10/08/2022) 1 each 0   brimonidine-timolol (COMBIGAN) 0.2-0.5 % ophthalmic solution Place 1 drop into both eyes every 12 (twelve) hours.     Cholecalciferol (VITAMIN D-3) 1000 UNITS CAPS Take 1 capsule by mouth daily.     Continuous Blood Gluc Receiver (FREESTYLE LIBRE 2 READER) DEVI Use to check blood sugars. 1 each 0   Continuous Blood Gluc Sensor (FREESTYLE LIBRE 2 SENSOR) MISC Apply every 14 days to check blood sugars. 6 each 1   diclofenac Sodium (VOLTAREN ARTHRITIS PAIN) 1 % GEL Apply 2 g topically 3 (three) times daily as needed. 100 g  0   dorzolamide-timolol (COSOPT) 2-0.5 % ophthalmic solution SMARTSIG:In Eye(s)     ezetimibe (ZETIA) 10 MG tablet Take 10 mg by mouth daily.     fexofenadine (ALLEGRA) 180 MG tablet Take 1 tablet (180 mg total) by mouth as needed for allergies or rhinitis. 90 tablet 0   gabapentin (NEURONTIN) 100 MG capsule Take 100 mg by mouth 2 (two) times daily.     glipiZIDE (GLUCOTROL XL) 10 MG 24 hr tablet TAKE ONE TABLET BY MOUTH EVERY MORNING WITH BREAKFAST FOR DIABETES 90 tablet 1   glucose blood (TRUE METRIX BLOOD GLUCOSE TEST) test strip TEST BLOOD SUGAR ONCE TO TWICE DAILY FOR DIABETES. (Patient not taking: Reported on 10/08/2022) 200 strip 1   isosorbide mononitrate (IMDUR) 60 MG 24 hr tablet Take 1.5 tablets (90 mg total) by mouth daily. Patient needs appointment for any future refills. Please call office at [phone] to schedule appt. 90 tablet 0   metoprolol succinate (TOPROL-XL) 100 MG 24 hr tablet TAKE ONE TABLET BY MOUTH EVERY MORNING WITH OR IMMEDIATELY FOLLOWING A MEAL 30 tablet 4   nitroGLYCERIN (NITROSTAT) 0.4 MG SL tablet Place 1 tablet (0.4 mg total) under the tongue every 5 (five) minutes as needed for chest pain. 26 tablet 0   pantoprazole (PROTONIX) 20 MG tablet TAKE 1 TABLET BY MOUTH DAILY FOR HEARTBURN 90 tablet 2   sertraline (ZOLOFT) 100 MG tablet TAKE 1 TABLET BY MOUTH DAILY FOR ANXIETY AND DEPRESSION 90 tablet 2   timolol (TIMOPTIC) 0.5 % ophthalmic solution      TRUEplus Lancets 33G MISC TEST BLOOD SUGAR ONCE TO TWICE DAILY FOR DIABETES. 200 each 3   vitamin B-12 (CYANOCOBALAMIN) 1000 MCG tablet Take 1,000 mcg by mouth daily.     VYZULTA 0.024 % SOLN Apply 1 drop to eye at bedtime.     No current facility-administered medications for this visit.    Allergies  Allergen Reactions   Other Rash and Anaphylaxis   Shellfish Allergy Anaphylaxis and Rash   Cephalexin     REACTION: trash and swelling REACTION: trash and swelling   Ciprofloxacin     REACTION: u/k    Clopidogrel Bisulfate     REACTION: swelling, rash   Codeine Phosphate     REACTION: unspecified   Ezetimibe-Simvastatin     REACTION: myalgias REACTION: myalgias   Hydrocod Poli-Chlorphe Poli Er     REACTION: u/k   Lidocaine Hives    ONLY TO ADHESIVE LIDOCAINE PATCHES. NO PROBLEM WITH INJECTABLE LIDOCAINE.   Nitrofurantoin     REACTION: [place] REACTION: [place]   Pregabalin     REACTION: felt bad REACTION: felt bad   Propoxyphene N-Acetaminophen     REACTION: [place]   Rofecoxib  unk unk    Sulfamethoxazole     REACTION: unspecified   Sulfonamide Derivatives     REACTION: unspecified   Celecoxib Rash    REACTION: [place] REACTION: [place]   Codeine Rash    REACTION: [place] REACTION: [place]   Hydrocod Poli-Chlorphe Poli Er Rash   Propoxyphene Rash   Sulfa Antibiotics Rash    REACTION: unspecified REACTION: unspecified    Social History   Socioeconomic History   Marital status: Widowed    Spouse name: Not on file   Number of children: 0   Years of education: Not on file   Highest education level: Not on file  Occupational History   Occupation: Retired    Fish farm manager: RETIRED   Occupation: retired  Tobacco Use   Smoking status: Never   Smokeless tobacco: Never  Scientific laboratory technician Use: Never used  Substance and Sexual Activity   Alcohol use: No   Drug use: No   Sexual activity: Not Currently  Other Topics Concern   Not on file  Social History Narrative   Not on file   Social Determinants of Health   Financial Resource Strain: Low Risk  (06/25/2022)   Overall Financial Resource Strain (CARDIA)    Difficulty of Paying Living Expenses: Not hard at all  Food Insecurity: No Food Insecurity (06/25/2022)   Hunger Vital Sign    Worried About Running Out of Food in the Last Year: Never true    [place] in the Last Year: Never true  Transportation Needs: No Transportation Needs (06/25/2022)   PRAPARE - Hydrologist (Medical): No    Lack of  Transportation (Non-Medical): No  Physical Activity: Inactive (06/25/2022)   Exercise Vital Sign    Days of Exercise per Week: 0 days    Minutes of Exercise per Session: 0 min  Stress: No Stress Concern Present (06/25/2022)   [place]    Feeling of Stress : Not at all  Social Connections: Not on file  Intimate Partner Violence: Not At Risk (06/23/2021)   Humiliation, Afraid, Rape, and Kick questionnaire    Fear of Current or Ex-Partner: No    Emotionally Abused: No    Physically Abused: No    Sexually Abused: No    Family History  Problem Relation Age of Onset   Cancer Mother        jaw   Hypertension Father    Heart attack Father    Heart attack Sister    Colon cancer Neg Hx     Review of Systems:  As stated in the HPI and otherwise negative.   There were no vitals taken for this visit.  Physical Examination: General: Well developed, well nourished, NAD  HEENT: OP clear, mucus membranes moist  SKIN: warm, dry. No rashes. Neuro: No focal deficits  Musculoskeletal: Muscle strength 5/5 all ext  Psychiatric: Mood and affect normal  Neck: No JVD, no carotid bruits, no thyromegaly, no lymphadenopathy.  Lungs:Clear bilaterally, no wheezes, rhonci, crackles Cardiovascular: Regular rate and rhythm. No murmurs, gallops or rubs. Abdomen:Soft. Bowel sounds present. Non-tender.  Extremities: No lower extremity edema. Pulses are 2 + in the bilateral DP/PT.  Echo May 2023:  1. Left ventricular ejection fraction, by estimation, is 60 to 65%. The  left ventricle has normal function. The left ventricle has no regional  wall motion abnormalities. There is mild concentric left ventricular  hypertrophy of the with  severe basal  septal thickness. Left ventricular diastolic parameters are consistent  with Grade I diastolic dysfunction (impaired relaxation).   2. Right ventricular systolic function is normal. The right  ventricular  size is normal.   3. Left atrial size was moderately dilated.   4. The pericardial effusion is posterior to the left ventricle. There is  no evidence of cardiac tamponade.   5. The mitral valve is normal in structure. No evidence of mitral valve  regurgitation. No evidence of mitral stenosis.   6. The aortic valve is tricuspid. Aortic valve regurgitation is mild.  Aortic valve sclerosis is present, with no evidence of aortic valve  stenosis.   7. The inferior vena cava is normal in size with greater than 50%  respiratory variability, suggesting right atrial pressure of 3 mmHg.   Cardiac cath 03/22/14: Left main: No obstructive disease.   Left Anterior Descending Artery: Large caliber vessel that courses to the apex. 30% proximal stenosis. 40% mid stenosis just prior to the stented segment. Mid stented segment patent without restenosis. Distal stented segment patent with 20% restenosis. Apical LAD becomes very small in caliber and has total occlusion with filling of apical vessel by left to left collaterals. (0.75 mm vessel).   Circumflex Artery: Large caliber vessel with several small OM branches. Diffuse luminal irregularities in the mid vessel.   Right Coronary Artery: Large dominant vessel with 20% mid stenosis.  Left Ventricular Angiogram: LVEF=70%.   EKG:  EKG is *** ordered today. The ekg ordered today demonstrates   Recent Labs: 08/18/2022: ALT 34; BUN 25; Creatinine, Ser 1.35; Potassium 4.8; Sodium 137 10/08/2022: Hemoglobin 11.5; Platelets 231.0   Lipid Panel    Component Value Date/Time   CHOL 123 02/01/2022 0853   TRIG 226 (H) 02/01/2022 0853   HDL 33 (L) 02/01/2022 0853   CHOLHDL 3.7 02/01/2022 0853   CHOLHDL 4 07/02/2021 1121   VLDL 34.6 07/02/2021 1121   LDLCALC 54 02/01/2022 0853   LDLDIRECT 93.0 08/06/2015 0847     Wt Readings from Last 3 Encounters:  10/08/22 161 lb (73 kg)  09/29/22 158 lb (71.7 kg)  09/20/22 167 lb 6 oz (75.9 kg)     Assessment and Plan:   1. CAD with stable angina: No change in stable chronic chest pain. Will continue ASA, beta blocker, Imdur, Bempedoic Acid and Zetia. She is statin intolerant.   2. Hyperlipidemia: She is statin intolerant. LDL 54 in March 2023  3. HTN: BP is well controlled. No changes  4. Aortic insufficiency: Mild by echo May 2023  Labs/ tests ordered today include:  No orders of the defined types were placed in this encounter.  Disposition:   F/U with me in 12 months  Signed, Lauree Chandler, MD 10/31/2022 12:11 PM    [facility] [address], [place], [place]  [address] Phone: [phone]; Fax: [phone]

## 2022-11-01 ENCOUNTER — Ambulatory Visit: Payer: Medicare PPO | Admitting: Cardiovascular Disease

## 2022-11-03 ENCOUNTER — Other Ambulatory Visit: Payer: Self-pay | Admitting: Podiatry

## 2022-11-03 ENCOUNTER — Telehealth: Payer: Self-pay | Admitting: Podiatry

## 2022-11-03 MED ORDER — GABAPENTIN 100 MG PO CAPS: 100.0000 mg | ORAL_CAPSULE | Freq: Three times a day (TID) | ORAL | 1 refills | Status: DC

## 2022-11-03 NOTE — Telephone Encounter (Signed)
Medication refill request:  gabapentin (NEURONTIN) 100 MG capsule   [facility] [id number] - [address], [address]   Please advise

## 2022-11-03 NOTE — Telephone Encounter (Signed)
Refill sent.  Please notify patient.  Thanks, Dr. Sallee Hogrefe

## 2022-11-10 ENCOUNTER — Telehealth: Payer: Medicare PPO | Admitting: Primary Care

## 2022-11-10 ENCOUNTER — Ambulatory Visit
Admission: EM | Admit: 2022-11-10 | Discharge: 2022-11-10 | Disposition: A | Discharge status: Home / Self Care | Payer: Medicare PPO | Attending: Urgent Care | Admitting: Urgent Care

## 2022-11-10 ENCOUNTER — Telehealth: Payer: Self-pay | Admitting: Primary Care

## 2022-11-10 DIAGNOSIS — R6889 Other general symptoms and signs: Secondary | ICD-10-CM | POA: Diagnosis not present

## 2022-11-10 DIAGNOSIS — R1110 Vomiting, unspecified: Secondary | ICD-10-CM | POA: Diagnosis not present

## 2022-11-10 DIAGNOSIS — R509 Fever, unspecified: Secondary | ICD-10-CM | POA: Diagnosis not present

## 2022-11-10 DIAGNOSIS — Z1152 Encounter for screening for COVID-19: Secondary | ICD-10-CM | POA: Insufficient documentation

## 2022-11-10 LAB — RESP PANEL BY RT-PCR (RSV, FLU A&B, COVID)  RVPGX2
Influenza A by PCR: POSITIVE — AB
Influenza B by PCR: NEGATIVE
Resp Syncytial Virus by PCR: NEGATIVE
SARS Coronavirus 2 by RT PCR: NEGATIVE

## 2022-11-10 MED ORDER — OSELTAMIVIR PHOSPHATE 75 MG PO CAPS: 75.0000 mg | ORAL_CAPSULE | Freq: Two times a day (BID) | ORAL | 0 refills | Status: DC

## 2022-11-10 NOTE — Telephone Encounter (Signed)
Unable to reach pt by phone; I spoke with Claiborne Billings (DPR signed) and Claiborne Billings said she saw pt earlier today and temp earlier today was 101.4. pt started with symptoms on 11/09/22 in evening; pt has vomiting and diarrhea, fever, cough, H/A, and pt told Claiborne Billings earlier she felt like she was going to pass out. Pt has not cked covid test and no test available for pt to test with. Claiborne Billings said pt having problems with her phones and Claiborne Billings will go over to pts home and advise her she needs to go to [facility] or ED and will take pt to either one if pt will go. Claiborne Billings said if pt refuses both she will call [place] back. Sending note to Gentry Fitz NP and [employer].

## 2022-11-10 NOTE — Telephone Encounter (Signed)
Patient called and stated she is having diarrhea, throwing up and temperature is 101. Patient was sent to access nurse.

## 2022-11-10 NOTE — ED Triage Notes (Signed)
Pt. Presents to UC w/ c/o a fever and multiple emesis episodes that started last night.

## 2022-11-10 NOTE — ED Provider Notes (Signed)
[name]    CSN: [id number] Arrival date & time: 11/10/22  1630      History   Chief Complaint Chief Complaint  Patient presents with   Fever   Emesis    HPI [name] [name] is a 88 y.o. female.    Fever Associated symptoms: vomiting   Emesis Associated symptoms: fever     Presents to urgent care with complaint of fever and multiple episodes of emesis starting last night.  She has been drinking water and Coca-Cola today and being able to hold down.  She reports cough that kept her awake last night.  Taking DayQuil and Coricidin which has been partially helpful in controlling her symptoms  Past Medical History:  Diagnosis Date   Acute bilateral thoracic back pain 08/16/2019   Allergy    Cipro, Plavix, Statins   Anemia    Arthritis    CAD (coronary artery disease)    a. s/p tandem Promus DES to LAD in 2009 by Dr. Olevia Perches;  b.  LHC (03/22/14):  prox LAD 30%, mid LAD 40%, LAD stent ok with dist 20% ISR, apical LAD occluded with L-L collats filling apical vessel (too small for PCI), mid RCA 20%, EF 70%.  Med Rx   Cancer [facility])    Colon polyps    Diabetes mellitus    Diagnosed 2012   Diverticulosis of colon (without mention of hemorrhage)    GERD (gastroesophageal reflux disease)    Glaucoma    Herpes zoster without complication 73/22/0254   Hx of cardiovascular stress test    a. Nuclear (08/2013):  No ischemia, EF 80%, Normal   Hx of echocardiogram    a. Echo (07/2013):  Mild LVH, vigorous LVF, EF 65-70%, Gr 1 DD, mild MR, mild to mod LAE   Hyperlipidemia    intol of statins   Hypertension    Kidney stones    NHL (non-Hodgkin's lymphoma) ([place]) dx'd 2003   chemo/xrt comp 2003   Personal history of colonic polyps    Proctitis    Pruritic intertrigo 07/18/2014   Urticaria     Patient Active Problem List   Diagnosis Date Noted   Post-nasal drip 09/29/2022   Chronic pain of both knees 08/27/2022   Urge incontinence of urine 02/05/2022   Hav (hallux  abducto valgus), unspecified laterality 01/25/2022   Nonrheumatic aortic valve insufficiency 10/05/2021   Left lower quadrant abdominal pain 01/21/2021   Skin mass 09/11/2020   Bilateral lower extremity edema 03/26/2020   Preventative health care 06/05/2019   Shoulder pain 03/13/2019   Tingling 06/29/2018   Recurrent UTI 01/12/2018   Neck pain 09/27/2017   Insomnia 09/27/2017   Anxiety and depression 05/04/2016   Hives 05/04/2016   Pain in joints of both feet 01/14/2016   Osteopenia 08/06/2015   Constipation 12/30/2014   B12 deficiency 12/30/2014   Allergic reaction 09/02/2014   Right hip pain 06/04/2014   Chronic fatigue 06/04/2014   Elevated LFTs 04/19/2014   Mitral regurgitation 04/19/2014   Pes planus 06/13/2013   Renal insufficiency 06/07/2011   Type 2 diabetes mellitus with chronic kidney disease ([place]) 05/31/2011   MIXED HYPERLIPIDEMIA 08/04/2010   LUMBAR SPRAIN AND STRAIN 01/15/2010   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 02/25/2009   EXTERNAL HEMORRHOIDS 02/24/2009   DIVERTICULOSIS, COLON 02/24/2009   LYMPHOMA, MALT 08/28/2007   DETACHED RETINA 08/28/2007   Essential hypertension 08/28/2007   Coronary artery disease involving native coronary artery of native heart without angina pectoris 08/28/2007  MITRAL VALVE PROLAPSE 08/28/2007   GERD 08/28/2007   IBS 08/28/2007   BREAST CYSTS, BILATERAL 08/28/2007    Past Surgical History:  Procedure Laterality Date   cardiac cath-neg     CORONARY ANGIOPLASTY WITH STENT PLACEMENT     x 2   dexa-neg     KNEE SURGERY  10/2003   left   laser surgery for cataracts-left     LEFT HEART CATHETERIZATION WITH CORONARY ANGIOGRAM N/A 03/22/2014   Procedure: LEFT HEART CATHETERIZATION WITH CORONARY ANGIOGRAM;  Surgeon: Burnell Blanks, MD;  Location: [facility] CATH LAB;  Service: Cardiovascular;  Laterality: N/A;   stress cardiolite     VAGINAL HYSTERECTOMY     partial, fibroids, one ovary left    OB History   No  obstetric history on file.      Home Medications    Prior to Admission medications   Medication Sig Start Date End Date Taking? Authorizing Provider  ferrous sulfate 325 (65 FE) MG tablet Take 1 tablet (325 mg total) by mouth daily with breakfast. 10/28/22   Willia Craze, NP  aspirin 81 MG tablet Take 81 mg by mouth daily.    [provider]  Bempedoic Acid-Ezetimibe (NEXLIZET) 180-10 MG TABS Take 1 tablet by mouth daily. Please keep scheduled appointment for future refills. Thank you. 10/14/22   Burnell Blanks, MD  Blood Glucose Calibration (TRUE METRIX LEVEL 1) Low SOLN Use to calibrate meter Patient not taking: Reported on 10/08/2022 11/14/20   Pleas Koch, NP  Blood Glucose Monitoring Suppl (TRUE METRIX METER) DEVI Test blood sugars once or twice daily for diabetes. Patient not taking: Reported on 10/08/2022 10/28/20   Pleas Koch, NP  brimonidine-timolol (COMBIGAN) 0.2-0.5 % ophthalmic solution Place 1 drop into both eyes every 12 (twelve) hours.    [provider]  Cholecalciferol (VITAMIN D-3) 1000 UNITS CAPS Take 1 capsule by mouth daily.    [provider]  Continuous Blood Gluc Receiver (FREESTYLE LIBRE 2 READER) DEVI Use to check blood sugars. 10/07/21   Pleas Koch, NP  Continuous Blood Gluc Sensor (FREESTYLE LIBRE 2 SENSOR) MISC Apply every 14 days to check blood sugars. 10/07/21   Pleas Koch, NP  diclofenac Sodium (VOLTAREN ARTHRITIS PAIN) 1 % GEL Apply 2 g topically 3 (three) times daily as needed. 08/27/22   Pleas Koch, NP  dorzolamide-timolol (COSOPT) 2-0.5 % ophthalmic solution SMARTSIG:In Eye(s) 09/03/22   [provider]  ezetimibe (ZETIA) 10 MG tablet Take 10 mg by mouth daily.    [provider]  fexofenadine (ALLEGRA) 180 MG tablet Take 1 tablet (180 mg total) by mouth as needed for allergies or rhinitis. 04/03/19   Pleas Koch, NP  gabapentin (NEURONTIN) 100 MG capsule Take  1 capsule (100 mg total) by mouth 3 (three) times daily. 11/03/22   Edrick Kins, DPM  glipiZIDE (GLUCOTROL XL) 10 MG 24 hr tablet TAKE ONE TABLET BY MOUTH EVERY MORNING WITH BREAKFAST FOR DIABETES 08/22/22   Pleas Koch, NP  glucose blood (TRUE METRIX BLOOD GLUCOSE TEST) test strip TEST BLOOD SUGAR ONCE TO TWICE DAILY FOR DIABETES. Patient not taking: Reported on 10/08/2022 05/29/21   Pleas Koch, NP  isosorbide mononitrate (IMDUR) 60 MG 24 hr tablet Take 1.5 tablets (90 mg total) by mouth daily. Patient needs appointment for any future refills. Please call office at [phone] to schedule appt. 10/13/22 10/14/23  Richardson Dopp T, PA-C  metoprolol succinate (TOPROL-XL) 100 MG 24 hr  tablet TAKE ONE TABLET BY MOUTH EVERY MORNING WITH OR IMMEDIATELY FOLLOWING A MEAL 10/13/22   Burnell Blanks, MD  nitroGLYCERIN (NITROSTAT) 0.4 MG SL tablet Place 1 tablet (0.4 mg total) under the tongue every 5 (five) minutes as needed for chest pain. 08/02/18   Pleas Koch, NP  pantoprazole (PROTONIX) 20 MG tablet TAKE 1 TABLET BY MOUTH DAILY FOR HEARTBURN 10/13/22   Pleas Koch, NP  sertraline (ZOLOFT) 100 MG tablet TAKE 1 TABLET BY MOUTH DAILY FOR ANXIETY AND DEPRESSION 10/13/22   Pleas Koch, NP  timolol (TIMOPTIC) 0.5 % ophthalmic solution  02/21/18   [provider]  TRUEplus Lancets 33G MISC TEST BLOOD SUGAR ONCE TO TWICE DAILY FOR DIABETES. 06/26/21   Pleas Koch, NP  vitamin B-12 (CYANOCOBALAMIN) 1000 MCG tablet Take 1,000 mcg by mouth daily.    [provider]  VYZULTA 0.024 % SOLN Apply 1 drop to eye at bedtime. 06/14/22   [provider]  loratadine (CLARITIN) 10 MG tablet Take 10 mg by mouth as needed.   02/15/12  [provider]    Family History Family History  Problem Relation Age of Onset   Cancer Mother        jaw   Hypertension Father    Heart attack Father    Heart attack Sister    Colon cancer Neg Hx      Social History Social History   Tobacco Use   Smoking status: Never   Smokeless tobacco: Never  Vaping Use   Vaping Use: Never used  Substance Use Topics   Alcohol use: No   Drug use: No     Allergies   Other, Shellfish allergy, Cephalexin, Ciprofloxacin, Clopidogrel bisulfate, Codeine phosphate, Ezetimibe-simvastatin, Hydrocod poli-chlorphe poli er, Lidocaine, Nitrofurantoin, Pregabalin, Propoxyphene n-acetaminophen, Rofecoxib, Sulfamethoxazole, Sulfonamide derivatives, Celecoxib, Codeine, Hydrocod poli-chlorphe poli er, Propoxyphene, and Sulfa antibiotics   Review of Systems Review of Systems  Constitutional:  Positive for fever.  Gastrointestinal:  Positive for vomiting.     Physical Exam Triage Vital Signs ED Triage Vitals  Enc Vitals Group     BP 11/10/22 1721 (!) 149/65     Pulse Rate 11/10/22 1721 85     Resp 11/10/22 1721 17     Temp 11/10/22 1721 99.5 F (37.5 C)     Temp src --      SpO2 11/10/22 1721 94 %     Weight --      Height --      Head Circumference --      Peak Flow --      Pain Score 11/10/22 1722 0     Pain Loc --      Pain Edu? --      Excl. in [place]? --    No data found.  Updated Vital Signs BP (!) 149/65   Pulse 85   Temp 99.5 F (37.5 C)   Resp 17   SpO2 94%   Visual Acuity Right Eye Distance:   Left Eye Distance:   Bilateral Distance:    Right Eye Near:   Left Eye Near:    Bilateral Near:     Physical Exam Vitals reviewed.  Constitutional:      Appearance: She is ill-appearing.  Cardiovascular:     Rate and Rhythm: Normal rate and regular rhythm.     Pulses: Normal pulses.     Heart sounds: Normal heart sounds.  Pulmonary:     Effort: Pulmonary effort is normal.  Breath sounds: Normal breath sounds.  Skin:    General: Skin is warm and dry.  Neurological:     General: No focal deficit present.     Mental Status: She is alert and oriented to person, place, and time.  Psychiatric:        Mood and Affect:  Mood normal.        Behavior: Behavior normal.      UC Treatments / Results  Labs (all labs ordered are listed, but only abnormal results are displayed) Labs Reviewed - No data to display  EKG   Radiology No results found.  Procedures Procedures (including critical care time)  Medications Ordered in UC Medications - No data to display  Initial Impression / Assessment and Plan / UC Course  I have reviewed the triage vital signs and the nursing notes.  Pertinent labs & imaging results that were available during my care of the patient were reviewed by me and considered in my medical decision making (see chart for details).   Patient is afebrile here without recent antipyretics. Satting well on room air. Overall is ill appearing, though well hydrated and without respiratory distress. Pulmonary exam is unremarkable.   Suspect viral process including flu, COVID, RSV.  Will treat presumptively for influenza with Tamiflu while awaiting results of respiratory swab.  She would be eligible for Paxlovid if COVID-positive.   Patient is 88 years old and at risk for poor outcome from RSV infection.  She has benzonatate at home and I encouraged her to use it for cough.  Warned her against combining multiple cold/flu remedies to avoid overdose on medications.  Final Clinical Impressions(s) / UC Diagnoses   Final diagnoses:  None   Discharge Instructions   None    ED Prescriptions   None    PDMP not reviewed this encounter.   Rose Phi, [place] 11/10/22 1746

## 2022-11-10 NOTE — Discharge Instructions (Addendum)
You have been diagnosed with a viral upper respiratory infection based on your symptoms and exam. Viral illnesses cannot be treated with antibiotics - they are self limiting - and you should find your symptoms resolving within a few days. Get plenty of rest and non-caffeinated fluids.  We have performed a respiratory swab checking for COVID, influenza, and RSV.  I am treating you today with Tamiflu based on presumption of an influenza diagnosis.  When your test results are available, someone will contact you to tell you whether to continue this medication or to discontinue it.   We recommend you use over-the-counter medications for symptom control including Tylenol or ibuprofen for fever, chills or body aches, and cold/cough medication.  Saline mist spray is helpful for removing excess mucus from your nose.  Room humidifiers are helpful to ease breathing at night.  Follow up here or with your primary care provider if your symptoms are worsening or not improving.

## 2022-11-10 NOTE — Telephone Encounter (Signed)
Noted.  Can we check on patient tomorrow (11/11/22)?

## 2022-11-10 NOTE — Telephone Encounter (Signed)
Noted. Will call and check in patient tomorrow (11/11/22)

## 2022-11-10 NOTE — Telephone Encounter (Signed)
[facility] - Client TELEPHONE ADVICE RECORD [facility] Patient Name: [facility] ANN Gender: Female DOB: Dec 18, 1931 Age: 88 Y 55 M 10 D Return Phone Number: [phone] (Primary), [phone] (Secondary) Address: [name] [address] Client [place] [facility] - Client Client Site [place] - Day Provider [name] - NP Contact Type Call Who Is Calling Patient / Member / Family / Caregiver Call Type Triage / Clinical Relationship To Patient Self Return Phone Number [phone] (Primary) Chief Complaint Vomiting Reason for Call Symptomatic / Request for [name] [name] states she has been vomiting, coughing. and temp 101.00. Translation No Nurse Assessment Nurse: Altamease Oiler, RN, Adriana Date/Time (Eastern Time): 11/10/2022 2:19:17 PM Confirm and document reason for call. If symptomatic, describe symptoms. ---pt states she started throwing up this morning (x1). fever of 101.0 oral. denies any other sx. Does the patient have any new or worsening symptoms? ---Yes Will a triage be completed? ---Yes Related visit to physician within the last 2 weeks? ---No Does the PT have any chronic conditions? (i.e. diabetes, asthma, this includes High risk factors for pregnancy, etc.) ---Yes List chronic conditions. ---htn diabetes Is this a behavioral health or substance abuse call? ---No Guidelines Guideline Title Affirmed Question Affirmed Notes Nurse Date/Time (Eastern Time) Disp. Time Eilene Ghazi Time) Disposition Final User 11/10/2022 2:38:27 PM Attempt made - message left Altamease Oiler RN, Fabio Bering 11/10/2022 2:53:57 PM Attempt made - message left Altamease Oiler, RN, Adriana 11/10/2022 3:04:11 PM FINAL ATTEMPT MADE - message left Yes Altamease Oiler RN, Fabio Bering Final Disposition 11/10/2022 3:04:11 PM FINAL ATTEMPT MADE - message left Yes Altamease Oiler, RN, Adriana PLEASE NOTE: All timestamps  contained within this report are represented as [place] Standard Time. CONFIDENTIALTY NOTICE: This fax transmission is intended only for the addressee. It contains information that is legally privileged, confidential or otherwise protected from use or disclosure. If you are not the intended recipient, you are strictly prohibited from reviewing, disclosing, copying using or disseminating any of this information or taking any action in reliance on or regarding this information. If you have received this fax in error, please notify us immediately by telephone so that we can arrange for its return to [place]. Phone: [phone], Toll-Free: [phone], Fax: [phone] Page: 2 of 2 Call Id: [id number] Comments User: Kizzie Fantasia, RN Date/Time Eilene Ghazi Time): 11/10/2022 2:26:55 PM [id number] User: Kizzie Fantasia, RN Date/Time Eilene Ghazi Time): 11/10/2022 2:31:01 PM pt was unable to hear me during the triage. i tried the secondary number and went straight to voicemail User: Kizzie Fantasia, RN Date/Time Eilene Ghazi Time): 11/10/2022 2:33:00 PM call continues to disconnect. busy ton

## 2022-11-11 NOTE — Telephone Encounter (Signed)
Noted.  Appreciate the follow-up.

## 2022-11-11 NOTE — Telephone Encounter (Signed)
Patient was seen in [place] on 11/10/22, she was Positive for Influenza and treated with Tamiflu. She states she feels the Tamiflu is helping but she still feels weak and has a cough. She will let us know if she needs anything or if her symptoms start worsening.

## 2022-11-17 NOTE — Telephone Encounter (Addendum)
Spoke to patient, she is feeling slightly better from having the flu last week. She was seen in UC and swabbed for covid, flu a& b, and RSV. Influenza A was positive, covid and rsv were negative. She was sent home with Tamiflu to take.  She does not have a fever currently and all other symptoms seem to have resolved, but the cough she has. She is still coughing up yellow mucus and has a bad headache because of it. She is requesting a prescription be sent in for something or advice on what to take OTC.  I advised we may need to see her in person to re-evaluate, but I would let her know what Anda Kraft advised.

## 2022-11-17 NOTE — Telephone Encounter (Signed)
A lingering cough is normal post flu/any infection. If all of her other symptoms have improved then she should be okay, but if she is concerned about her symptoms then I am happy to see her.  If she is requesting something for the cough then I recommend Robitussin DM or Delsym DM.  She can find these over-the-counter.

## 2022-11-18 NOTE — Telephone Encounter (Signed)
Called patient, she stated she got the robitussin and is taking it, but doesn't see much improvement. I advised that she needs to be seen if she continues to worsen. We do not have any appointments available until 12/26, I advised she needed to go to UC or ER if she is feeling weaker and can't stop coughing. She states she will go if she doesn't see any improvement by tomorrow and will call back in next week and update [place].

## 2022-11-18 NOTE — Telephone Encounter (Signed)
Patient called in stating that she got the otc robitussin today,but she feels like she is still not getting better. She said that she is still spitting up mucus and getting weaker and not not able to eat.

## 2022-11-18 NOTE — Telephone Encounter (Signed)
Called patient and reviewed all information. She was appreciative of the OTC recommendations, and would let us know if those aren't helping her and she needs to be seen.

## 2022-11-19 ENCOUNTER — Ambulatory Visit (INDEPENDENT_AMBULATORY_CARE_PROVIDER_SITE_OTHER): Payer: Medicare PPO

## 2022-11-19 ENCOUNTER — Ambulatory Visit
Admission: EM | Admit: 2022-11-19 | Discharge: 2022-11-19 | Disposition: A | Discharge status: Home / Self Care | Payer: Medicare PPO | Attending: Urgent Care | Admitting: Urgent Care

## 2022-11-19 DIAGNOSIS — R0602 Shortness of breath: Secondary | ICD-10-CM

## 2022-11-19 DIAGNOSIS — R059 Cough, unspecified: Secondary | ICD-10-CM | POA: Diagnosis not present

## 2022-11-19 DIAGNOSIS — R051 Acute cough: Secondary | ICD-10-CM

## 2022-11-19 MED ORDER — PREDNISONE 20 MG PO TABS: ORAL_TABLET | ORAL | 0 refills | Status: AC

## 2022-11-19 NOTE — ED Provider Notes (Addendum)
[name] [name]    CSN: [id number] Arrival date & time: 11/19/22  1134      History   Chief Complaint Chief Complaint  Patient presents with   Cough   Fatigue    HPI [name] [name] is a 88 y.o. female.    Cough   Patient presents to urgent care with complaint of productive cough, fatigue, decrease in appetite x 8 days.    Patient was seen on 11/10/2022, 9 days ago and was influenza A positive.  She was treated with Tamiflu.  Benzonatate was not ordered as she said she had it at home already.  Past Medical History:  Diagnosis Date   Acute bilateral thoracic back pain 08/16/2019   Allergy    Cipro, Plavix, Statins   Anemia    Arthritis    CAD (coronary artery disease)    a. s/p tandem Promus DES to LAD in 2009 by Dr. Olevia Perches;  b.  LHC (03/22/14):  prox LAD 30%, mid LAD 40%, LAD stent ok with dist 20% ISR, apical LAD occluded with L-L collats filling apical vessel (too small for PCI), mid RCA 20%, EF 70%.  Med Rx   Cancer [facility])    Colon polyps    Diabetes mellitus    Diagnosed 2012   Diverticulosis of colon (without mention of hemorrhage)    GERD (gastroesophageal reflux disease)    Glaucoma    Herpes zoster without complication 76/19/5093   Hx of cardiovascular stress test    a. Nuclear (08/2013):  No ischemia, EF 80%, Normal   Hx of echocardiogram    a. Echo (07/2013):  Mild LVH, vigorous LVF, EF 65-70%, Gr 1 DD, mild MR, mild to mod LAE   Hyperlipidemia    intol of statins   Hypertension    Kidney stones    NHL (non-Hodgkin's lymphoma) ([place]) dx'd 2003   chemo/xrt comp 2003   Personal history of colonic polyps    Proctitis    Pruritic intertrigo 07/18/2014   Urticaria     Patient Active Problem List   Diagnosis Date Noted   Post-nasal drip 09/29/2022   Chronic pain of both knees 08/27/2022   Urge incontinence of urine 02/05/2022   Hav (hallux abducto valgus), unspecified laterality 01/25/2022   Nonrheumatic aortic valve insufficiency 10/05/2021    Left lower quadrant abdominal pain 01/21/2021   Skin mass 09/11/2020   Bilateral lower extremity edema 03/26/2020   Preventative health care 06/05/2019   Shoulder pain 03/13/2019   Tingling 06/29/2018   Recurrent UTI 01/12/2018   Neck pain 09/27/2017   Insomnia 09/27/2017   Anxiety and depression 05/04/2016   Hives 05/04/2016   Pain in joints of both feet 01/14/2016   Osteopenia 08/06/2015   Constipation 12/30/2014   B12 deficiency 12/30/2014   Allergic reaction 09/02/2014   Right hip pain 06/04/2014   Chronic fatigue 06/04/2014   Elevated LFTs 04/19/2014   Mitral regurgitation 04/19/2014   Pes planus 06/13/2013   Renal insufficiency 06/07/2011   Type 2 diabetes mellitus with chronic kidney disease ([place]) 05/31/2011   MIXED HYPERLIPIDEMIA 08/04/2010   LUMBAR SPRAIN AND STRAIN 01/15/2010   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 02/25/2009   EXTERNAL HEMORRHOIDS 02/24/2009   DIVERTICULOSIS, COLON 02/24/2009   LYMPHOMA, MALT 08/28/2007   DETACHED RETINA 08/28/2007   Essential hypertension 08/28/2007   Coronary artery disease involving native coronary artery of native heart without angina pectoris 08/28/2007   MITRAL VALVE PROLAPSE 08/28/2007   GERD 08/28/2007   IBS  08/28/2007   BREAST CYSTS, BILATERAL 08/28/2007    Past Surgical History:  Procedure Laterality Date   cardiac cath-neg     CORONARY ANGIOPLASTY WITH STENT PLACEMENT     x 2   dexa-neg     KNEE SURGERY  10/2003   left   laser surgery for cataracts-left     LEFT HEART CATHETERIZATION WITH CORONARY ANGIOGRAM N/A 03/22/2014   Procedure: LEFT HEART CATHETERIZATION WITH CORONARY ANGIOGRAM;  Surgeon: Burnell Blanks, MD;  Location: [facility] CATH LAB;  Service: Cardiovascular;  Laterality: N/A;   stress cardiolite     VAGINAL HYSTERECTOMY     partial, fibroids, one ovary left    OB History   No obstetric history on file.      Home Medications    Prior to Admission medications   Medication Sig  Start Date End Date Taking? Authorizing Provider  ferrous sulfate 325 (65 FE) MG tablet Take 1 tablet (325 mg total) by mouth daily with breakfast. 10/28/22   Willia Craze, NP  aspirin 81 MG tablet Take 81 mg by mouth daily.    [provider]  Bempedoic Acid-Ezetimibe (NEXLIZET) 180-10 MG TABS Take 1 tablet by mouth daily. Please keep scheduled appointment for future refills. Thank you. 10/14/22   Burnell Blanks, MD  Blood Glucose Calibration (TRUE METRIX LEVEL 1) Low SOLN Use to calibrate meter Patient not taking: Reported on 10/08/2022 11/14/20   Pleas Koch, NP  Blood Glucose Monitoring Suppl (TRUE METRIX METER) DEVI Test blood sugars once or twice daily for diabetes. Patient not taking: Reported on 10/08/2022 10/28/20   Pleas Koch, NP  brimonidine-timolol (COMBIGAN) 0.2-0.5 % ophthalmic solution Place 1 drop into both eyes every 12 (twelve) hours.    [provider]  Cholecalciferol (VITAMIN D-3) 1000 UNITS CAPS Take 1 capsule by mouth daily.    [provider]  Continuous Blood Gluc Receiver (FREESTYLE LIBRE 2 READER) DEVI Use to check blood sugars. 10/07/21   Pleas Koch, NP  Continuous Blood Gluc Sensor (FREESTYLE LIBRE 2 SENSOR) MISC Apply every 14 days to check blood sugars. 10/07/21   Pleas Koch, NP  diclofenac Sodium (VOLTAREN ARTHRITIS PAIN) 1 % GEL Apply 2 g topically 3 (three) times daily as needed. 08/27/22   Pleas Koch, NP  dorzolamide-timolol (COSOPT) 2-0.5 % ophthalmic solution SMARTSIG:In Eye(s) 09/03/22   [provider]  ezetimibe (ZETIA) 10 MG tablet Take 10 mg by mouth daily.    [provider]  fexofenadine (ALLEGRA) 180 MG tablet Take 1 tablet (180 mg total) by mouth as needed for allergies or rhinitis. 04/03/19   Pleas Koch, NP  gabapentin (NEURONTIN) 100 MG capsule Take 1 capsule (100 mg total) by mouth 3 (three) times daily. 11/03/22   Edrick Kins, DPM  glipiZIDE  (GLUCOTROL XL) 10 MG 24 hr tablet TAKE ONE TABLET BY MOUTH EVERY MORNING WITH BREAKFAST FOR DIABETES 08/22/22   Pleas Koch, NP  glucose blood (TRUE METRIX BLOOD GLUCOSE TEST) test strip TEST BLOOD SUGAR ONCE TO TWICE DAILY FOR DIABETES. Patient not taking: Reported on 10/08/2022 05/29/21   Pleas Koch, NP  isosorbide mononitrate (IMDUR) 60 MG 24 hr tablet Take 1.5 tablets (90 mg total) by mouth daily. Patient needs appointment for any future refills. Please call office at [phone] to schedule appt. 10/13/22 10/14/23  Richardson Dopp T, PA-C  metoprolol succinate (TOPROL-XL) 100 MG 24 hr tablet TAKE ONE TABLET BY MOUTH EVERY MORNING WITH OR IMMEDIATELY  FOLLOWING A MEAL 10/13/22   Burnell Blanks, MD  nitroGLYCERIN (NITROSTAT) 0.4 MG SL tablet Place 1 tablet (0.4 mg total) under the tongue every 5 (five) minutes as needed for chest pain. 08/02/18   Pleas Koch, NP  oseltamivir (TAMIFLU) 75 MG capsule Take 1 capsule (75 mg total) by mouth every 12 (twelve) hours. 11/10/22   Wardell Pokorski, Annie Main, FNP  pantoprazole (PROTONIX) 20 MG tablet TAKE 1 TABLET BY MOUTH DAILY FOR HEARTBURN 10/13/22   Pleas Koch, NP  sertraline (ZOLOFT) 100 MG tablet TAKE 1 TABLET BY MOUTH DAILY FOR ANXIETY AND DEPRESSION 10/13/22   Pleas Koch, NP  timolol (TIMOPTIC) 0.5 % ophthalmic solution  02/21/18   [provider]  TRUEplus Lancets 33G MISC TEST BLOOD SUGAR ONCE TO TWICE DAILY FOR DIABETES. 06/26/21   Pleas Koch, NP  vitamin B-12 (CYANOCOBALAMIN) 1000 MCG tablet Take 1,000 mcg by mouth daily.    [provider]  VYZULTA 0.024 % SOLN Apply 1 drop to eye at bedtime. 06/14/22   [provider]  loratadine (CLARITIN) 10 MG tablet Take 10 mg by mouth as needed.   02/15/12  [provider]    Family History Family History  Problem Relation Age of Onset   Cancer Mother        jaw   Hypertension Father    Heart attack Father    Heart attack Sister     Colon cancer Neg Hx     Social History Social History   Tobacco Use   Smoking status: Never   Smokeless tobacco: Never  Vaping Use   Vaping Use: Never used  Substance Use Topics   Alcohol use: No   Drug use: No     Allergies   Other, Shellfish allergy, Cephalexin, Ciprofloxacin, Clopidogrel bisulfate, Codeine phosphate, Ezetimibe-simvastatin, Hydrocod poli-chlorphe poli er, Lidocaine, Nitrofurantoin, Pregabalin, Propoxyphene n-acetaminophen, Rofecoxib, Sulfamethoxazole, Sulfonamide derivatives, Celecoxib, Codeine, Hydrocod poli-chlorphe poli er, Propoxyphene, and Sulfa antibiotics   Review of Systems Review of Systems  Respiratory:  Positive for cough.      Physical Exam Triage Vital Signs ED Triage Vitals  Enc Vitals Group     BP 11/19/22 1227 (!) 146/75     Pulse Rate 11/19/22 1227 75     Resp 11/19/22 1227 18     Temp 11/19/22 1227 99.1 F (37.3 C)     Temp Source 11/19/22 1227 Oral     SpO2 11/19/22 1227 92 %     Weight --      Height --      Head Circumference --      Peak Flow --      Pain Score 11/19/22 1226 0     Pain Loc --      Pain Edu? --      Excl. in [place]? --    No data found.  Updated Vital Signs BP (!) 146/75 (BP Location: Right Arm)   Pulse 75   Temp 99.1 F (37.3 C) (Oral)   Resp 18   SpO2 92% Comment: Pt has on red nail polish.  Visual Acuity Right Eye Distance:   Left Eye Distance:   Bilateral Distance:    Right Eye Near:   Left Eye Near:    Bilateral Near:     Physical Exam Vitals reviewed.  Constitutional:      Appearance: She is ill-appearing.  Cardiovascular:     Rate and Rhythm: Normal rate and regular rhythm.  Pulmonary:     Effort:  Pulmonary effort is normal.     Breath sounds: Wheezing and rhonchi present.  Skin:    General: Skin is warm and dry.  Neurological:     General: No focal deficit present.     Mental Status: She is alert and oriented to person, place, and time.  Psychiatric:        Mood and  Affect: Mood normal.        Behavior: Behavior normal.      UC Treatments / Results  Labs (all labs ordered are listed, but only abnormal results are displayed) Labs Reviewed - No data to display  EKG   Radiology No results found.  Procedures Procedures (including critical care time)  Medications Ordered in UC Medications - No data to display  Initial Impression / Assessment and Plan / UC Course  I have reviewed the triage vital signs and the nursing notes.  Pertinent labs & imaging results that were available during my care of the patient were reviewed by me and considered in my medical decision making (see chart for details).   Patient is afebrile here without recent antipyretics. Satting poorly (92%) on room air without hypoxia. Overall is ill appearing, though appears well hydrated, and without respiratory distress. Pulmonary exam is remarkable for rhonchorous breath sounds in all lobes with wheezing.  Chest xray indicates no acute process. Chronic interstitial thickening is present.  Given chronic interstitial lung disease, Sutor treatment with levofloxacin however she has intolerance to Cipro listed in her allergies.  Will treat with prednisone and caution patient to go to ED for evaluation if she develops any new or worsening symptoms including shortness of breath or difficulty breathing.  Also suggested that she since talk to her PCP regarding the chronic findings on today's x-ray.  Final Clinical Impressions(s) / UC Diagnoses   Final diagnoses:  Acute cough  SOB (shortness of breath)   Discharge Instructions   None    ED Prescriptions   None    PDMP not reviewed this encounter.   Rose Phi, FNP 11/19/22 1325    ImmordinoAnnie Main, [place] 11/19/22 1339    Rose Phi, FNP 11/19/22 1340

## 2022-11-19 NOTE — ED Triage Notes (Signed)
Pt. Presents to UC w/ c/o a productive cough, fatigue and decrease in appetite for the past 8 days.

## 2022-11-19 NOTE — Discharge Instructions (Signed)
Go to emergency room for evaluation if you develop any new or worsening symptoms including shortness of breath.  Follow up here or with your primary care provider if your symptoms are worsening or not improving with treatment.

## 2022-11-26 ENCOUNTER — Encounter: Payer: Self-pay | Admitting: Primary Care

## 2022-11-26 ENCOUNTER — Ambulatory Visit: Payer: Medicare PPO | Admitting: Primary Care

## 2022-11-26 VITALS — BP 148/84 | HR 73 | Temp 98.1°F | Ht 69.0 in | Wt 157.0 lb

## 2022-11-26 DIAGNOSIS — R051 Acute cough: Secondary | ICD-10-CM | POA: Diagnosis not present

## 2022-11-26 MED ORDER — AMOXICILLIN-POT CLAVULANATE 875-125 MG PO TABS: 1.0000 | ORAL_TABLET | Freq: Two times a day (BID) | ORAL | 0 refills | Status: DC

## 2022-11-26 NOTE — Assessment & Plan Note (Addendum)
Suspicious for bacterial cause at this point. She does appear stable for outpatient treatment.  Reviewed both urgent care visits from December 2023.  Start Augmentin antibiotics. Take 1 tablet by mouth twice daily for 7 days. Close follow up in 1-2 weeks.   Repeat chest xray at that time.  Continue Benzonatate capsules PRN.

## 2022-11-26 NOTE — Progress Notes (Signed)
Subjective:    Patient ID: [name] [name], female    DOB: 06-22-1932, 88 y.o.   MRN: [id number]  Cough Associated symptoms include shortness of breath. Pertinent negatives include no fever or headaches.    [name] [name] is a very pleasant 88 y.o. female with a history of type 2 diabetes, hypertension CAD, GERD, recurrent UTI, CKD, urinary incontinence who presents today   Evaluated at Urgent Care in [place] on 11/10/22 for multiple episodes of nausea with vomiting, fevers that began the evening prior. She was treated for presumed influenza with a Tamiflu course while testing was in process. She was also provided a course of Tessalon Perles to use PRN. Testing did reveal positive influenza A, negative Covid19 infection, negative RSV infection.   Evaluated again at Urgent Care in [place] on 11/19/22 for an 8 day history of decreased appetite, fatigue, productive cough. Exam revealed oxygen saturation of 92% on room air, rhonchi present on lung sounds. Chest xray negative for acute process but did show chronic interstitial thickening. She was treated with prednisone course and was provided with ED precautions.   Today she continues to experience lingering cough, chest congestion, fatigue. She completed her course of prednisone which did not help much with symptoms. Her cough is productive with yellow/green sputum. She isn't sleeping well during the night.   She's tried numerous cough syrups without improvement. She has not been treated with antibiotics. She has multiple drug allergies. She has taken Augmentin antibiotics and has tolerated well.   Review of Systems  Constitutional:  Positive for appetite change and fatigue. Negative for fever.  HENT:  Positive for congestion. Negative for sinus pressure.   Respiratory:  Positive for cough, chest tightness and shortness of breath.   Neurological:  Negative for headaches.         Past Medical History:  Diagnosis Date   Acute bilateral  thoracic back pain 08/16/2019   Allergy    Cipro, Plavix, Statins   Anemia    Arthritis    CAD (coronary artery disease)    a. s/p tandem Promus DES to LAD in 2009 by Dr. Olevia Perches;  b.  LHC (03/22/14):  prox LAD 30%, mid LAD 40%, LAD stent ok with dist 20% ISR, apical LAD occluded with L-L collats filling apical vessel (too small for PCI), mid RCA 20%, EF 70%.  Med Rx   Cancer [facility])    Colon polyps    Diabetes mellitus    Diagnosed 2012   Diverticulosis of colon (without mention of hemorrhage)    GERD (gastroesophageal reflux disease)    Glaucoma    Herpes zoster without complication 89/38/1017   Hx of cardiovascular stress test    a. Nuclear (08/2013):  No ischemia, EF 80%, Normal   Hx of echocardiogram    a. Echo (07/2013):  Mild LVH, vigorous LVF, EF 65-70%, Gr 1 DD, mild MR, mild to mod LAE   Hyperlipidemia    intol of statins   Hypertension    Kidney stones    NHL (non-Hodgkin's lymphoma) ([place]) dx'd 2003   chemo/xrt comp 2003   Personal history of colonic polyps    Proctitis    Pruritic intertrigo 07/18/2014   Urticaria     Social History   Socioeconomic History   Marital status: Widowed    Spouse name: Not on file   Number of children: 0   Years of education: Not on file   Highest education level: Not on file  Occupational History  Occupation: Retired    Fish farm manager: RETIRED   Occupation: retired  Tobacco Use   Smoking status: Never   Smokeless tobacco: Never  Scientific laboratory technician Use: Never used  Substance and Sexual Activity   Alcohol use: No   Drug use: No   Sexual activity: Not Currently  Other Topics Concern   Not on file  Social History Narrative   Not on file   Social Determinants of Health   Financial Resource Strain: Low Risk  (06/25/2022)   Overall Financial Resource Strain (CARDIA)    Difficulty of Paying Living Expenses: Not hard at all  Food Insecurity: No Food Insecurity (06/25/2022)   Hunger Vital Sign    Worried About Running Out of Food in  the Last Year: Never true    [place] in the Last Year: Never true  Transportation Needs: No Transportation Needs (06/25/2022)   PRAPARE - Hydrologist (Medical): No    Lack of Transportation (Non-Medical): No  Physical Activity: Inactive (06/25/2022)   Exercise Vital Sign    Days of Exercise per Week: 0 days    Minutes of Exercise per Session: 0 min  Stress: No Stress Concern Present (06/25/2022)   [place]    Feeling of Stress : Not at all  Social Connections: Not on file  Intimate Partner Violence: Not At Risk (06/23/2021)   Humiliation, Afraid, Rape, and Kick questionnaire    Fear of Current or Ex-Partner: No    Emotionally Abused: No    Physically Abused: No    Sexually Abused: No    Past Surgical History:  Procedure Laterality Date   cardiac cath-neg     CORONARY ANGIOPLASTY WITH STENT PLACEMENT     x 2   dexa-neg     KNEE SURGERY  10/2003   left   laser surgery for cataracts-left     LEFT HEART CATHETERIZATION WITH CORONARY ANGIOGRAM N/A 03/22/2014   Procedure: LEFT HEART CATHETERIZATION WITH CORONARY ANGIOGRAM;  Surgeon: Burnell Blanks, MD;  Location: [facility] CATH LAB;  Service: Cardiovascular;  Laterality: N/A;   stress cardiolite     VAGINAL HYSTERECTOMY     partial, fibroids, one ovary left    Family History  Problem Relation Age of Onset   Cancer Mother        jaw   Hypertension Father    Heart attack Father    Heart attack Sister    Colon cancer Neg Hx     Allergies  Allergen Reactions   Other Rash and Anaphylaxis   Shellfish Allergy Anaphylaxis and Rash   Cephalexin     REACTION: trash and swelling REACTION: trash and swelling   Ciprofloxacin     REACTION: u/k   Clopidogrel Bisulfate     REACTION: swelling, rash   Codeine Phosphate     REACTION: unspecified   Ezetimibe-Simvastatin     REACTION: myalgias REACTION: myalgias   Hydrocod  Poli-Chlorphe Poli Er     REACTION: u/k   Lidocaine Hives    ONLY TO ADHESIVE LIDOCAINE PATCHES. NO PROBLEM WITH INJECTABLE LIDOCAINE.   Nitrofurantoin     REACTION: [place] REACTION: [place]   Pregabalin     REACTION: felt bad REACTION: felt bad   Propoxyphene N-Acetaminophen     REACTION: [place]   Rofecoxib     unk unk    Sulfamethoxazole     REACTION: unspecified   Sulfonamide Derivatives  REACTION: unspecified   Celecoxib Rash    REACTION: [place] REACTION: [place]   Codeine Rash    REACTION: [place] REACTION: [place]   Hydrocod Poli-Chlorphe Poli Er Rash   Propoxyphene Rash   Sulfa Antibiotics Rash    REACTION: unspecified REACTION: unspecified    Current Outpatient Medications on File Prior to Visit  Medication Sig Dispense Refill   aspirin 81 MG tablet Take 81 mg by mouth daily.     Bempedoic Acid-Ezetimibe (NEXLIZET) 180-10 MG TABS Take 1 tablet by mouth daily. Please keep scheduled appointment for future refills. Thank you. 90 tablet 0   Blood Glucose Calibration (TRUE METRIX LEVEL 1) Low SOLN Use to calibrate meter 3 each 0   Blood Glucose Monitoring Suppl (TRUE METRIX METER) DEVI Test blood sugars once or twice daily for diabetes. 1 each 0   brimonidine-timolol (COMBIGAN) 0.2-0.5 % ophthalmic solution Place 1 drop into both eyes every 12 (twelve) hours.     Cholecalciferol (VITAMIN D-3) 1000 UNITS CAPS Take 1 capsule by mouth daily.     Continuous Blood Gluc Receiver (FREESTYLE LIBRE 2 READER) DEVI Use to check blood sugars. 1 each 0   Continuous Blood Gluc Sensor (FREESTYLE LIBRE 2 SENSOR) MISC Apply every 14 days to check blood sugars. 6 each 1   diclofenac Sodium (VOLTAREN ARTHRITIS PAIN) 1 % GEL Apply 2 g topically 3 (three) times daily as needed. 100 g 0   dorzolamide-timolol (COSOPT) 2-0.5 % ophthalmic solution SMARTSIG:In Eye(s)     ezetimibe (ZETIA) 10 MG tablet Take 10 mg by mouth daily.     ferrous sulfate 325 (65 FE) MG tablet Take 1 tablet (325 mg total) by mouth daily with  breakfast. 30 tablet 2   fexofenadine (ALLEGRA) 180 MG tablet Take 1 tablet (180 mg total) by mouth as needed for allergies or rhinitis. 90 tablet 0   gabapentin (NEURONTIN) 100 MG capsule Take 1 capsule (100 mg total) by mouth 3 (three) times daily. 180 capsule 1   glipiZIDE (GLUCOTROL XL) 10 MG 24 hr tablet TAKE ONE TABLET BY MOUTH EVERY MORNING WITH BREAKFAST FOR DIABETES 90 tablet 1   glucose blood (TRUE METRIX BLOOD GLUCOSE TEST) test strip TEST BLOOD SUGAR ONCE TO TWICE DAILY FOR DIABETES. 200 strip 1   isosorbide mononitrate (IMDUR) 60 MG 24 hr tablet Take 1.5 tablets (90 mg total) by mouth daily. Patient needs appointment for any future refills. Please call office at [phone] to schedule appt. 90 tablet 0   metoprolol succinate (TOPROL-XL) 100 MG 24 hr tablet TAKE ONE TABLET BY MOUTH EVERY MORNING WITH OR IMMEDIATELY FOLLOWING A MEAL 30 tablet 4   nitroGLYCERIN (NITROSTAT) 0.4 MG SL tablet Place 1 tablet (0.4 mg total) under the tongue every 5 (five) minutes as needed for chest pain. 26 tablet 0   pantoprazole (PROTONIX) 20 MG tablet TAKE 1 TABLET BY MOUTH DAILY FOR HEARTBURN 90 tablet 2   sertraline (ZOLOFT) 100 MG tablet TAKE 1 TABLET BY MOUTH DAILY FOR ANXIETY AND DEPRESSION 90 tablet 2   timolol (TIMOPTIC) 0.5 % ophthalmic solution      TRUEplus Lancets 33G MISC TEST BLOOD SUGAR ONCE TO TWICE DAILY FOR DIABETES. 200 each 3   vitamin B-12 (CYANOCOBALAMIN) 1000 MCG tablet Take 1,000 mcg by mouth daily.     VYZULTA 0.024 % SOLN Apply 1 drop to eye at bedtime.     oseltamivir (TAMIFLU) 75 MG capsule Take 1 capsule (75 mg total) by mouth every 12 (twelve) hours. (Patient not taking: Reported  on 11/26/2022) 10 capsule 0   [DISCONTINUED] loratadine (CLARITIN) 10 MG tablet Take 10 mg by mouth as needed.      No current facility-administered medications on file prior to visit.    BP (!) 148/84   Pulse 73   Temp 98.1 F (36.7 C) (Temporal)   Ht '5\' 9"'$  (1.753 m)   Wt 157 lb (71.2 kg)    SpO2 98%   BMI 23.18 kg/m  Objective:   Physical Exam Constitutional:      Appearance: She is ill-appearing.  HENT:     Right Ear: Tympanic membrane and ear canal normal.     Left Ear: Tympanic membrane and ear canal normal.  Cardiovascular:     Rate and Rhythm: Normal rate.  Pulmonary:     Effort: Pulmonary effort is normal.     Breath sounds: Examination of the right-lower field reveals rhonchi. Examination of the left-lower field reveals rhonchi. Rhonchi present.  Lymphadenopathy:     Cervical: No cervical adenopathy.  Skin:    General: Skin is warm and dry.           Assessment & Plan:   Problem List Items Addressed This Visit       Other   Acute cough - Primary    Suspicious for bacterial cause at this point. She does appear stable for outpatient treatment.  Reviewed both urgent care visits from December 2023.  Start Augmentin antibiotics. Take 1 tablet by mouth twice daily for 7 days. Close follow up in 1-2 weeks.   Repeat chest xray at that time.  Continue Benzonatate capsules PRN.      Relevant Medications   amoxicillin-clavulanate (AUGMENTIN) 875-125 MG tablet       Pleas Koch, NP

## 2022-11-26 NOTE — Patient Instructions (Signed)
Start Augmentin antibiotics for the infection Take 1 tablet by mouth twice daily for 7 days.  Continue the [employer] as needed for cough.  It was a pleasure to see you today!

## 2022-12-07 ENCOUNTER — Encounter: Payer: Self-pay | Admitting: Podiatry

## 2022-12-15 ENCOUNTER — Other Ambulatory Visit: Payer: Self-pay | Admitting: Physician Assistant

## 2022-12-15 DIAGNOSIS — E1122 Type 2 diabetes mellitus with diabetic chronic kidney disease: Secondary | ICD-10-CM

## 2022-12-16 NOTE — Progress Notes (Signed)
4  Chief Complaint  Patient presents with   Follow-up    CAD   History of Present Illness: 89 yo female with history of CAD, HLD, HTN, non-Hodgkin's lymphoma, DM and mitral regurgitation here today for cardiac follow up. In 2009 she had tandem non-overlapping drug-eluting stents placed in the LAD. Last cardiac cath April 2015 with patent LAD stents, occluded apical LAD segment with left to left collaterals and non-obstructive disease elsewhere. She has been intolerant to statins and Plavix in the past. She has been on Zetia. Echo 01/20/16 with normal LV size and function, trivial aortic valve insufficiency. She has had bilateral foot pain and normal ABI in 2016. Last nuclear stress test October 2017 with no ischemia. She has stable angina controlled with Imdur.  Echo May 2021 with LVEF=60-65%. Mild AI. Echo May 2023 with LVEF=60-65%, mild LVH. Mild AI.   She is here today for follow up. The patient denies any chest pain, dyspnea, palpitations, orthopnea, PND, dizziness, near syncope or syncope.   Primary Care Physician: Pleas Koch, NP  Past Medical History:  Diagnosis Date   Acute bilateral thoracic back pain 08/16/2019   Allergy    Cipro, Plavix, Statins   Anemia    Arthritis    CAD (coronary artery disease)    a. s/p tandem Promus DES to LAD in 2009 by Dr. Olevia Perches;  b.  LHC (03/22/14):  prox LAD 30%, mid LAD 40%, LAD stent ok with dist 20% ISR, apical LAD occluded with L-L collats filling apical vessel (too small for PCI), mid RCA 20%, EF 70%.  Med Rx   Cancer [facility])    Colon polyps    Diabetes mellitus    Diagnosed 2012   Diverticulosis of colon (without mention of hemorrhage)    GERD (gastroesophageal reflux disease)    Glaucoma    Herpes zoster without complication 09/98/3382   Hx of cardiovascular stress test    a. Nuclear (08/2013):  No ischemia, EF 80%, Normal   Hx of echocardiogram    a. Echo (07/2013):  Mild LVH, vigorous LVF, EF 65-70%, Gr 1 DD, mild MR, mild to mod LAE    Hyperlipidemia    intol of statins   Hypertension    Kidney stones    NHL (non-Hodgkin's lymphoma) ([place]) dx'd 2003   chemo/xrt comp 2003   Personal history of colonic polyps    Proctitis    Pruritic intertrigo 07/18/2014   Urticaria     Past Surgical History:  Procedure Laterality Date   cardiac cath-neg     CORONARY ANGIOPLASTY WITH STENT PLACEMENT     x 2   dexa-neg     KNEE SURGERY  10/2003   left   laser surgery for cataracts-left     LEFT HEART CATHETERIZATION WITH CORONARY ANGIOGRAM N/A 03/22/2014   Procedure: LEFT HEART CATHETERIZATION WITH CORONARY ANGIOGRAM;  Surgeon: Burnell Blanks, MD;  Location: [facility] CATH LAB;  Service: Cardiovascular;  Laterality: N/A;   stress cardiolite     VAGINAL HYSTERECTOMY     partial, fibroids, one ovary left    Current Outpatient Medications  Medication Sig Dispense Refill   amoxicillin-clavulanate (AUGMENTIN) 875-125 MG tablet Take 1 tablet by mouth 2 (two) times daily. 14 tablet 0   aspirin 81 MG tablet Take 81 mg by mouth daily.     Bempedoic Acid-Ezetimibe (NEXLIZET) 180-10 MG TABS Take 1 tablet by mouth daily. Please keep scheduled appointment for future refills. Thank you. 90 tablet 0   Blood Glucose  Calibration (TRUE METRIX LEVEL 1) Low SOLN Use to calibrate meter 3 each 0   Blood Glucose Monitoring Suppl (TRUE METRIX METER) DEVI Test blood sugars once or twice daily for diabetes. 1 each 0   brimonidine-timolol (COMBIGAN) 0.2-0.5 % ophthalmic solution Place 1 drop into both eyes every 12 (twelve) hours.     Cholecalciferol (VITAMIN D-3) 1000 UNITS CAPS Take 1 capsule by mouth daily.     Continuous Blood Gluc Receiver (FREESTYLE LIBRE 2 READER) DEVI Use to check blood sugars. 1 each 0   Continuous Blood Gluc Sensor (FREESTYLE LIBRE 2 SENSOR) MISC Apply every 14 days to check blood sugars. 6 each 1   diclofenac Sodium (VOLTAREN ARTHRITIS PAIN) 1 % GEL Apply 2 g topically 3 (three) times daily as needed. 100 g 0    dorzolamide-timolol (COSOPT) 2-0.5 % ophthalmic solution SMARTSIG:In Eye(s)     ezetimibe (ZETIA) 10 MG tablet Take 10 mg by mouth daily.     ferrous sulfate 325 (65 FE) MG tablet Take 1 tablet (325 mg total) by mouth daily with breakfast. 30 tablet 2   fexofenadine (ALLEGRA) 180 MG tablet Take 1 tablet (180 mg total) by mouth as needed for allergies or rhinitis. 90 tablet 0   gabapentin (NEURONTIN) 100 MG capsule Take 1 capsule (100 mg total) by mouth 3 (three) times daily. 180 capsule 1   glipiZIDE (GLUCOTROL XL) 10 MG 24 hr tablet TAKE ONE TABLET BY MOUTH EVERY MORNING WITH BREAKFAST FOR DIABETES 90 tablet 1   glucose blood (TRUE METRIX BLOOD GLUCOSE TEST) test strip TEST BLOOD SUGAR ONCE TO TWICE DAILY FOR DIABETES. 200 strip 1   isosorbide mononitrate (IMDUR) 60 MG 24 hr tablet Take 1.5 tablets (90 mg total) by mouth daily. 135 tablet 1   metoprolol succinate (TOPROL-XL) 100 MG 24 hr tablet TAKE ONE TABLET BY MOUTH EVERY MORNING WITH OR IMMEDIATELY FOLLOWING A MEAL 30 tablet 4   oseltamivir (TAMIFLU) 75 MG capsule Take 1 capsule (75 mg total) by mouth every 12 (twelve) hours. 10 capsule 0   pantoprazole (PROTONIX) 20 MG tablet TAKE 1 TABLET BY MOUTH DAILY FOR HEARTBURN 90 tablet 2   sertraline (ZOLOFT) 100 MG tablet TAKE 1 TABLET BY MOUTH DAILY FOR ANXIETY AND DEPRESSION 90 tablet 2   timolol (TIMOPTIC) 0.5 % ophthalmic solution      TRUEplus Lancets 33G MISC TEST BLOOD SUGAR ONCE TO TWICE DAILY FOR DIABETES. 200 each 3   vitamin B-12 (CYANOCOBALAMIN) 1000 MCG tablet Take 1,000 mcg by mouth daily.     VYZULTA 0.024 % SOLN Apply 1 drop to eye at bedtime.     nitroGLYCERIN (NITROSTAT) 0.4 MG SL tablet Place 1 tablet (0.4 mg total) under the tongue every 5 (five) minutes as needed for chest pain. 25 tablet 3   No current facility-administered medications for this visit.    Allergies  Allergen Reactions   Other Rash and Anaphylaxis   Shellfish Allergy Anaphylaxis and Rash   Cephalexin      REACTION: trash and swelling REACTION: trash and swelling   Ciprofloxacin     REACTION: u/k   Clopidogrel Bisulfate     REACTION: swelling, rash   Codeine Phosphate     REACTION: unspecified   Ezetimibe-Simvastatin     REACTION: myalgias REACTION: myalgias   Hydrocod Poli-Chlorphe Poli Er     REACTION: u/k   Lidocaine Hives    ONLY TO ADHESIVE LIDOCAINE PATCHES. NO PROBLEM WITH INJECTABLE LIDOCAINE.   Nitrofurantoin     REACTION:  [place] REACTION: [place]   Pregabalin     REACTION: felt bad REACTION: felt bad   Propoxyphene N-Acetaminophen     REACTION: [place]   Rofecoxib     unk unk    Sulfamethoxazole     REACTION: unspecified   Sulfonamide Derivatives     REACTION: unspecified   Celecoxib Rash    REACTION: [place] REACTION: [place]   Codeine Rash    REACTION: [place] REACTION: [place]   Hydrocod Poli-Chlorphe Poli Er Rash   Propoxyphene Rash   Sulfa Antibiotics Rash    REACTION: unspecified REACTION: unspecified    Social History   Socioeconomic History   Marital status: Widowed    Spouse name: Not on file   Number of children: 0   Years of education: Not on file   Highest education level: Not on file  Occupational History   Occupation: Retired    Fish farm manager: RETIRED   Occupation: retired  Tobacco Use   Smoking status: Never   Smokeless tobacco: Never  Scientific laboratory technician Use: Never used  Substance and Sexual Activity   Alcohol use: No   Drug use: No   Sexual activity: Not Currently  Other Topics Concern   Not on file  Social History Narrative   Not on file   Social Determinants of Health   Financial Resource Strain: Low Risk  (06/25/2022)   Overall Financial Resource Strain (CARDIA)    Difficulty of Paying Living Expenses: Not hard at all  Food Insecurity: No Food Insecurity (06/25/2022)   Hunger Vital Sign    Worried About Running Out of Food in the Last Year: Never true    Magnet in the Last Year: Never true  Transportation Needs: No Transportation Needs  (06/25/2022)   PRAPARE - Hydrologist (Medical): No    Lack of Transportation (Non-Medical): No  Physical Activity: Inactive (06/25/2022)   Exercise Vital Sign    Days of Exercise per Week: 0 days    Minutes of Exercise per Session: 0 min  Stress: No Stress Concern Present (06/25/2022)   [place]    Feeling of Stress : Not at all  Social Connections: Not on file  Intimate Partner Violence: Not At Risk (06/23/2021)   Humiliation, Afraid, Rape, and Kick questionnaire    Fear of Current or Ex-Partner: No    Emotionally Abused: No    Physically Abused: No    Sexually Abused: No    Family History  Problem Relation Age of Onset   Cancer Mother        jaw   Hypertension Father    Heart attack Father    Heart attack Sister    Colon cancer Neg Hx     Review of Systems:  As stated in the HPI and otherwise negative.   BP 124/60   Pulse 70   Ht '5\' 9"'$  (1.753 m)   Wt 70.6 kg   SpO2 96%   BMI 22.98 kg/m   Physical Examination: General: Well developed, well nourished, NAD  HEENT: OP clear, mucus membranes moist  SKIN: warm, dry. No rashes. Neuro: No focal deficits  Musculoskeletal: Muscle strength 5/5 all ext  Psychiatric: Mood and affect normal  Neck: No JVD, no carotid bruits, no thyromegaly, no lymphadenopathy.  Lungs:Clear bilaterally, no wheezes, rhonci, crackles Cardiovascular: Regular rate and rhythm. Systolic murmur.  Abdomen:Soft. Bowel sounds present. Non-tender.  Extremities: 1+ edema right leg. No  edema left leg.   Echo May 2023:   1. Left ventricular ejection fraction, by estimation, is 60 to 65%. The  left ventricle has normal function. The left ventricle has no regional  wall motion abnormalities. There is mild concentric left ventricular  hypertrophy of the with severe basal  septal thickness. Left ventricular diastolic parameters are consistent  with Grade I  diastolic dysfunction (impaired relaxation).   2. Right ventricular systolic function is normal. The right ventricular  size is normal.   3. Left atrial size was moderately dilated.   4. The pericardial effusion is posterior to the left ventricle. There is  no evidence of cardiac tamponade.   5. The mitral valve is normal in structure. No evidence of mitral valve  regurgitation. No evidence of mitral stenosis.   6. The aortic valve is tricuspid. Aortic valve regurgitation is mild.  Aortic valve sclerosis is present, with no evidence of aortic valve  stenosis.   7. The inferior vena cava is normal in size with greater than 50%  respiratory variability, suggesting right atrial pressure of 3 mmHg.   Cardiac cath 03/22/14: Left main: No obstructive disease.   Left Anterior Descending Artery: Large caliber vessel that courses to the apex. 30% proximal stenosis. 40% mid stenosis just prior to the stented segment. Mid stented segment patent without restenosis. Distal stented segment patent with 20% restenosis. Apical LAD becomes very small in caliber and has total occlusion with filling of apical vessel by left to left collaterals. (0.75 mm vessel).   Circumflex Artery: Large caliber vessel with several small OM branches. Diffuse luminal irregularities in the mid vessel.   Right Coronary Artery: Large dominant vessel with 20% mid stenosis.  Left Ventricular Angiogram: LVEF=70%.   EKG:  EKG is ordered today. The ekg ordered today demonstrates NSR  Recent Labs: 08/18/2022: ALT 34; BUN 25; Creatinine, Ser 1.35; Potassium 4.8; Sodium 137 10/08/2022: Hemoglobin 11.5; Platelets 231.0   Lipid Panel    Component Value Date/Time   CHOL 123 02/01/2022 0853   TRIG 226 (H) 02/01/2022 0853   HDL 33 (L) 02/01/2022 0853   CHOLHDL 3.7 02/01/2022 0853   CHOLHDL 4 07/02/2021 1121   VLDL 34.6 07/02/2021 1121   LDLCALC 54 02/01/2022 0853   LDLDIRECT 93.0 08/06/2015 0847     Wt Readings from Last 3  Encounters:  12/17/22 70.6 kg  11/26/22 71.2 kg  10/08/22 73 kg    Assessment and Plan:   1. CAD with stable angina: No change in chronic chest pains. Will continue ASA, Toprol, Imdur and Zetia. She is statin intolerant.   2. Hyperlipidemia: LDL 54 in March 2023. Continue Zetia. She is statin intolerant.   3. HTN: BP is well controlled. No changes  4. Aortic insufficiency: Mild by echo May 2023.    5. Right leg edema: Will arrange venous dopplers to exclude DVT. She wishes to do this next week.   Labs/ tests ordered today include:   Orders Placed This Encounter  Procedures   EKG 12-Lead   VAS [place] LOWER EXTREMITY VENOUS (DVT)   Disposition:   F/U with me in 12 months  Signed, Lauree Chandler, MD 12/17/2022 2:44 PM    [facility] [place], [place], [place]  [address] Phone: [phone]; Fax: [phone]

## 2022-12-17 ENCOUNTER — Encounter: Payer: Self-pay | Admitting: Cardiovascular Disease

## 2022-12-17 ENCOUNTER — Ambulatory Visit: Payer: Medicare PPO | Attending: Cardiovascular Disease | Admitting: Cardiovascular Disease

## 2022-12-17 VITALS — BP 124/60 | HR 70 | Ht 69.0 in | Wt 155.6 lb

## 2022-12-17 DIAGNOSIS — I10 Essential (primary) hypertension: Secondary | ICD-10-CM

## 2022-12-17 DIAGNOSIS — E782 Mixed hyperlipidemia: Secondary | ICD-10-CM

## 2022-12-17 DIAGNOSIS — I351 Nonrheumatic aortic (valve) insufficiency: Secondary | ICD-10-CM | POA: Diagnosis not present

## 2022-12-17 DIAGNOSIS — M7989 Other specified soft tissue disorders: Secondary | ICD-10-CM | POA: Diagnosis not present

## 2022-12-17 DIAGNOSIS — I2510 Atherosclerotic heart disease of native coronary artery without angina pectoris: Secondary | ICD-10-CM | POA: Diagnosis not present

## 2022-12-17 DIAGNOSIS — K219 Gastro-esophageal reflux disease without esophagitis: Secondary | ICD-10-CM

## 2022-12-17 MED ORDER — NITROGLYCERIN 0.4 MG SL SUBL: 0.4000 mg | SUBLINGUAL_TABLET | SUBLINGUAL | 3 refills | Status: DC | PRN

## 2022-12-17 NOTE — Patient Instructions (Signed)
Medication Instructions:  No changes *If you need a refill on your cardiac medications before your next appointment, please call your pharmacy*   Lab Work: none   Testing/Procedures: Your physician has requested that you have a lower extremity venous duplex. This test is an ultrasound of the veins in the legs. It looks at venous blood flow that carries blood from the heart to the legs. Allow one hour for a Lower Venous exam.  There are no restrictions or special instructions.   Follow-Up: At [facility], you and your health needs are our priority.  As part of our continuing mission to provide you with exceptional heart care, we have created designated Provider Care Teams.  These Care Teams include your primary Cardiologist (physician) and Advanced Practice Providers (APPs -  Physician Assistants and Nurse Practitioners) who all work together to provide you with the care you need, when you need it.   Your next appointment:   12 month(s)  Provider:   Lauree Chandler, MD

## 2022-12-20 ENCOUNTER — Other Ambulatory Visit: Payer: Self-pay

## 2022-12-20 ENCOUNTER — Ambulatory Visit (HOSPITAL_COMMUNITY)
Admission: RE | Admit: 2022-12-20 | Discharge: 2022-12-20 | Disposition: A | Discharge status: Home / Self Care | Payer: Medicare PPO | Source: Ambulatory Visit | Attending: Internal Medicine | Admitting: Internal Medicine

## 2022-12-20 ENCOUNTER — Telehealth: Payer: Self-pay

## 2022-12-20 DIAGNOSIS — M7989 Other specified soft tissue disorders: Secondary | ICD-10-CM | POA: Diagnosis not present

## 2022-12-20 DIAGNOSIS — M8580 Other specified disorders of bone density and structure, unspecified site: Secondary | ICD-10-CM

## 2022-12-20 DIAGNOSIS — E2839 Other primary ovarian failure: Secondary | ICD-10-CM

## 2022-12-20 MED ORDER — APIXABAN 5 MG PO TABS: 10.0000 mg | ORAL_TABLET | Freq: Two times a day (BID) | ORAL | 5 refills | Status: DC

## 2022-12-20 NOTE — Telephone Encounter (Signed)
Call received from [facility],  Ms. Sharlett Iles, RVT.    Pt seen by Dr. Estevan Ryder on 12/17/2022 at 200 pm. [place] Lower extremity ordered.   Per Ms. Varney Biles, Pt has DVT present in RLE, discovered on exam.  Providers orders needed / call received in [place] Triage office.    Pt concern taken to DOD, Dr. Edmonia James, with last visit information printed for assessment / review.   Per Dr. Johnsie Cancel:   Have CBC and BMET drawn while patient is at [facility] office.   Start patient on Eliquis 10 mg, BID for 7 days / 1 week.  Then, decrease dose to Eliquis 5 mg, BID.  Schedule a follow up appointment with Dr. Angelena Form or Mid-level provider. Ms. Varney Biles called back at [phone], and let her know lab orders CBC / BMET will be entered, and needs lab draw prior to leaving [facility].  Pt Eliquis script sent [facility] to [facility] on [facility] [address] [facility] for pick up, and Ms. Varney Biles asked to make patient aware of importance of starting Eliquis today.  Will obtain / schedule a follow up appointment for the patient per Dr. Johnsie Cancel order.   I will call patient to follow up with orders, and verify understanding regarding this new plan of care.      Pt scheduled for a follow up appointment at [facility] on [address], 12/24/2022 at 245 pm with Ms. Dayna Dunn, PA-C.  Will advise patient of this appointment.

## 2022-12-20 NOTE — Telephone Encounter (Signed)
Pt called at 350 pm on 12/20/2022 to educate patient on anticoag therapy, Eliquis.    Pt advised to [facility] Eli[facility] today, and begin taking medication.  Pt educated on plan of care, and that we scheduled a follow up [facility] [address] appointment on 12/24/2022 at 230 pm this Friday.    Pt understood the medication education, and the importance of seeing Ms. Dayna Dunn PA-C this Friday.   Pt advised to call [facility] on [address] with any questions, concerns, or new symptoms.  Pt understood all information shared with my telephone call.  Pt stated she will come to the 12/24/22 appointment for follow up care.

## 2022-12-21 LAB — BASIC METABOLIC PANEL
BUN/Creatinine Ratio: 14 (ref 12–28)
BUN: 20 mg/dL (ref 8–27)
CO2: 21 mmol/L (ref 20–29)
Calcium: 9.3 mg/dL (ref 8.7–10.3)
Chloride: 103 mmol/L (ref 96–106)
Creatinine, Ser: 1.46 mg/dL — ABNORMAL HIGH (ref 0.57–1.00)
Glucose: 188 mg/dL — ABNORMAL HIGH (ref 70–99)
Potassium: 5.4 mmol/L — ABNORMAL HIGH (ref 3.5–5.2)
Sodium: 141 mmol/L (ref 134–144)
eGFR: 34 mL/min/{1.73_m2} — ABNORMAL LOW (ref >59)

## 2022-12-21 LAB — CBC WITH DIFFERENTIAL/PLATELET
Basophils Absolute: 0 10*3/uL (ref 0.0–0.2)
Basos: 1 %
EOS (ABSOLUTE): 0.1 10*3/uL (ref 0.0–0.4)
Eos: 3 %
Hematocrit: 34 % (ref 34.0–46.6)
Hemoglobin: 11.1 g/dL (ref 11.1–15.9)
Immature Grans (Abs): 0 10*3/uL (ref 0.0–0.1)
Immature Granulocytes: 0 %
Lymphocytes Absolute: 1.8 10*3/uL (ref 0.7–3.1)
Lymphs: 49 %
MCH: 29.9 pg (ref 26.6–33.0)
MCHC: 32.6 g/dL (ref 31.5–35.7)
MCV: 92 fL (ref 79–97)
Monocytes Absolute: 0.3 10*3/uL (ref 0.1–0.9)
Monocytes: 9 %
Neutrophils Absolute: 1.4 10*3/uL (ref 1.4–7.0)
Neutrophils: 38 %
Platelets: 279 10*3/uL (ref 150–450)
RBC: 3.71 x10E6/uL — ABNORMAL LOW (ref 3.77–5.28)
RDW: 14.2 % (ref 11.7–15.4)
WBC: 3.6 10*3/uL (ref 3.4–10.8)

## 2022-12-23 ENCOUNTER — Encounter: Payer: Self-pay | Admitting: Physician Assistant

## 2022-12-23 MED ORDER — BLOOD GLUCOSE TEST VI STRP: 1.0000 | ORAL_STRIP | Freq: Three times a day (TID) | 5 refills | Status: AC

## 2022-12-23 MED ORDER — LANCETS MISC. MISC: 1.0000 | Freq: Three times a day (TID) | 5 refills | Status: AC

## 2022-12-23 MED ORDER — BLOOD GLUCOSE MONITORING SUPPL DEVI: 0 refills | Status: DC

## 2022-12-23 MED ORDER — LANCET DEVICE MISC: 1.0000 | Freq: Three times a day (TID) | 0 refills | Status: AC

## 2022-12-23 NOTE — Progress Notes (Addendum)
Cardiology Office Note    Date:  12/24/2022   ID:  [name] [name], DOB Apr 28, 1932, MRN [id number]  PCP:  Pleas Koch, NP  Cardiologist:  Lauree Chandler, MD  Electrophysiologist:  None   Chief Complaint: f/u DVT   History of Present Illness:   [name] [name] is a 89 y.o. female with history of CAD (tandem DES to LAD 2009), HLD, HTN, non-Hodgkin's lymphoma, DM, CKD stage 3b, mild aortic regurgitation, positive hemoccult 09/2022, recent RLE DVT here today for follow up.  Had prior PCI in 2009 as noted with last cath in 2015 with patent LAD stents, occluded apical LAD segment with left to left collaterals and non-obstructive disease elsewhere, last nuc 2017 without ischemia, with h/o stable angina controlled with Imdur. She has been intolerant to statins and Plavix in the past. She has been on Zetia. She has had bilateral foot pain and normal ABI in 2016. Last echo 03/2022 EF 60-65%, mild concentric left ventricular hypertrophy of the with severe basal septal thickness, G1DD, moderate LAE, mild AI, trivial pericardial effusion. She was recently seen in the office 12/17/22 for routine follow-up with no change in chronic chest pains. Due to right leg edema, LE venous duplex was arranged - patient deferred until 12/20/22 which showed "Findings consistent with age indeterminate deep vein thrombosis involving the right soleal veins." Patient was started on DVT dosing of Eliquis with recommendation for follow-up. Of note she had flu A in 10/2022 and was very ill for 3 weeks with a lot of bedrest. She was also seen by GI in 09/2022 for + FOBT and mild anemia - patient hesitant to proceed with EGD/colonoscopy at that time, she wished to defer.  She is seen back for follow-up today overall feeling alright without any overt CP, SOB, palpitations, dizziness or syncope. Her RLE edema has improved. She is noted to have softer BP today, 104/62 and recheck 98/62. She notes that ever since she started Eliquis,  her stools have been very dark, in the dark brown to black color.   Labwork independently reviewed: 12/20/22 K 5.4, Cr 1.46, Hgb 11.1, plt 279 10/2022 influenza A + 08/2022 FOBT+ 07/2022 AST 52, alt wnl 01/2022 LDL 54, trig 226 06/2021 A1c 8.8  Past History   Past Medical History:  Diagnosis Date   Acute bilateral thoracic back pain 08/16/2019   Allergy    Cipro, Plavix, Statins   Anemia    Arthritis    CAD (coronary artery disease)    a. s/p tandem Promus DES to LAD in 2009 by Dr. Olevia Perches;  b.  LHC (03/22/14):  prox LAD 30%, mid LAD 40%, LAD stent ok with dist 20% ISR, apical LAD occluded with L-L collats filling apical vessel (too small for PCI), mid RCA 20%, EF 70%.  Med Rx   Cancer [facility])    Chronic kidney disease, stage 3b ([place])    Colon polyps    Diabetes mellitus    Diagnosed 2012   Diverticulosis of colon (without mention of hemorrhage)    DVT (deep venous thrombosis) (HCC)    GERD (gastroesophageal reflux disease)    Glaucoma    Herpes zoster without complication 67/89/3810   Hyperlipidemia    intol of statins   Hypertension    Kidney stones    NHL (non-Hodgkin's lymphoma) ([place]) dx'd 2003   chemo/xrt comp 2003   Personal history of colonic polyps    Proctitis    Pruritic intertrigo 07/18/2014   Urticaria  Past Surgical History:  Procedure Laterality Date   cardiac cath-neg     CORONARY ANGIOPLASTY WITH STENT PLACEMENT     x 2   dexa-neg     KNEE SURGERY  10/2003   left   laser surgery for cataracts-left     LEFT HEART CATHETERIZATION WITH CORONARY ANGIOGRAM N/A 03/22/2014   Procedure: LEFT HEART CATHETERIZATION WITH CORONARY ANGIOGRAM;  Surgeon: Burnell Blanks, MD;  Location: [facility] CATH LAB;  Service: Cardiovascular;  Laterality: N/A;   stress cardiolite     VAGINAL HYSTERECTOMY     partial, fibroids, one ovary left    Current Medications: Current Meds  Medication Sig   amoxicillin-clavulanate (AUGMENTIN) 875-125 MG tablet Take 1 tablet by mouth  2 (two) times daily.   apixaban (ELIQUIS) 5 MG TABS tablet Take 2 tablets (10 mg total) by mouth 2 (two) times daily. For 7 days ( 1 week ) ONLY.  THEN: Take 1 tablet ( 5 mg ) by mouth 2 ( two ) times daily.   aspirin 81 MG tablet Take 81 mg by mouth daily.   Bempedoic Acid-Ezetimibe (NEXLIZET) 180-10 MG TABS Take 1 tablet by mouth daily. Please keep scheduled appointment for future refills. Thank you.   Blood Glucose Monitoring Suppl DEVI Use up to three times daily to check blood sugar. May substitute to any manufacturer covered by patient's insurance.   brimonidine-timolol (COMBIGAN) 0.2-0.5 % ophthalmic solution Place 1 drop into both eyes every 12 (twelve) hours.   Cholecalciferol (VITAMIN D-3) 1000 UNITS CAPS Take 1 capsule by mouth daily.   diclofenac Sodium (VOLTAREN ARTHRITIS PAIN) 1 % GEL Apply 2 g topically 3 (three) times daily as needed.   dorzolamide-timolol (COSOPT) 2-0.5 % ophthalmic solution SMARTSIG:In Eye(s)   ezetimibe (ZETIA) 10 MG tablet Take 10 mg by mouth daily.   ferrous sulfate 325 (65 FE) MG tablet Take 1 tablet (325 mg total) by mouth daily with breakfast.   fexofenadine (ALLEGRA) 180 MG tablet Take 1 tablet (180 mg total) by mouth as needed for allergies or rhinitis.   gabapentin (NEURONTIN) 100 MG capsule Take 1 capsule (100 mg total) by mouth 3 (three) times daily.   glipiZIDE (GLUCOTROL XL) 10 MG 24 hr tablet TAKE ONE TABLET BY MOUTH EVERY MORNING WITH BREAKFAST FOR DIABETES   Glucose Blood (BLOOD GLUCOSE TEST STRIPS) STRP 1 each by In Vitro route in the morning, at noon, and at bedtime. May substitute to any manufacturer covered by patient's insurance.   isosorbide mononitrate (IMDUR) 60 MG 24 hr tablet Take 1.5 tablets (90 mg total) by mouth daily.   Lancet Device MISC 1 each by Does not apply route in the morning, at noon, and at bedtime. May substitute to any manufacturer covered by patient's insurance.   Lancets Misc. MISC 1 each by Does not apply route in the  morning, at noon, and at bedtime. May substitute to any manufacturer covered by patient's insurance.   metoprolol succinate (TOPROL-XL) 100 MG 24 hr tablet TAKE ONE TABLET BY MOUTH EVERY MORNING WITH OR IMMEDIATELY FOLLOWING A MEAL   nitroGLYCERIN (NITROSTAT) 0.4 MG SL tablet Place 1 tablet (0.4 mg total) under the tongue every 5 (five) minutes as needed for chest pain.   oseltamivir (TAMIFLU) 75 MG capsule Take 1 capsule (75 mg total) by mouth every 12 (twelve) hours.   pantoprazole (PROTONIX) 20 MG tablet TAKE 1 TABLET BY MOUTH DAILY FOR HEARTBURN   sertraline (ZOLOFT) 100 MG tablet TAKE 1 TABLET BY MOUTH DAILY FOR ANXIETY  AND DEPRESSION   timolol (TIMOPTIC) 0.5 % ophthalmic solution    vitamin B-12 (CYANOCOBALAMIN) 1000 MCG tablet Take 1,000 mcg by mouth daily.   VYZULTA 0.024 % SOLN Apply 1 drop to eye at bedtime.      Allergies:   Other, Shellfish allergy, Cephalexin, Ciprofloxacin, Clopidogrel bisulfate, Codeine phosphate, Ezetimibe-simvastatin, Hydrocod poli-chlorphe poli er, Lidocaine, Nitrofurantoin, Pregabalin, Propoxyphene n-acetaminophen, Rofecoxib, Sulfamethoxazole, Sulfonamide derivatives, Celecoxib, Codeine, Hydrocod poli-chlorphe poli er, Propoxyphene, and Sulfa antibiotics   Social History   Socioeconomic History   Marital status: Widowed    Spouse name: Not on file   Number of children: 0   Years of education: Not on file   Highest education level: Not on file  Occupational History   Occupation: Retired    Fish farm manager: RETIRED   Occupation: retired  Tobacco Use   Smoking status: Never   Smokeless tobacco: Never  Scientific laboratory technician Use: Never used  Substance and Sexual Activity   Alcohol use: No   Drug use: No   Sexual activity: Not Currently  Other Topics Concern   Not on file  Social History Narrative   Not on file   Social Determinants of Health   Financial Resource Strain: Low Risk  (06/25/2022)   Overall Financial Resource Strain (CARDIA)    Difficulty  of Paying Living Expenses: Not hard at all  Food Insecurity: No Food Insecurity (06/25/2022)   Hunger Vital Sign    Worried About Running Out of Food in the Last Year: Never true    [place] in the Last Year: Never true  Transportation Needs: No Transportation Needs (06/25/2022)   PRAPARE - Hydrologist (Medical): No    Lack of Transportation (Non-Medical): No  Physical Activity: Inactive (06/25/2022)   Exercise Vital Sign    Days of Exercise per Week: 0 days    Minutes of Exercise per Session: 0 min  Stress: No Stress Concern Present (06/25/2022)   Javen Hinderliter    Feeling of Stress : Not at all  Social Connections: Not on file     Family History:  The patient's family history includes Cancer in her mother; Heart attack in her father and sister; Hypertension in her father. There is no history of Colon cancer.  ROS:   Please see the history of present illness. All other systems are reviewed and otherwise negative.    EKG(s)/Additional Testing   EKG:  EKG is ordered today, personally reviewed, demonstrating NSR 68bpm, possible prior inferior and anterior infarct, no acute STTW changes from prior.  CV Studies: Cardiac studies reviewed are outlined and summarized above. Otherwise please see EMR for full report.  Recent Labs: 08/18/2022: ALT 34 12/20/2022: BUN 20; Creatinine, Ser 1.46; Hemoglobin 11.1; Platelets 279; Potassium 5.4; Sodium 141  Recent Lipid Panel    Component Value Date/Time   CHOL 123 02/01/2022 0853   TRIG 226 (H) 02/01/2022 0853   HDL 33 (L) 02/01/2022 0853   CHOLHDL 3.7 02/01/2022 0853   CHOLHDL 4 07/02/2021 1121   VLDL 34.6 07/02/2021 1121   LDLCALC 54 02/01/2022 0853   LDLDIRECT 93.0 08/06/2015 0847    PHYSICAL EXAM:    VS:  BP 104/62   Ht '5\' 9"'$  (1.753 m)   Wt 156 lb (70.8 kg)   SpO2 95%   BMI 23.04 kg/m   BMI: Body mass index is 23.04 kg/m.  GEN:  Well nourished, well developed female in  no acute distress HEENT: normocephalic, atraumatic Neck: no JVD, carotid bruits, or masses Cardiac: RRR; no murmurs, rubs, or gallops, no edema  Respiratory:  clear to auscultation bilaterally, normal work of breathing GI: soft, nontender, nondistended, + BS MS: no deformity or atrophy Skin: warm and dry, no rash Neuro:  Alert and Oriented x 3, Strength and sensation are intact, follows commands Psych: euthymic mood, full affect  Wt Readings from Last 3 Encounters:  12/24/22 156 lb (70.8 kg)  12/17/22 155 lb 9.6 oz (70.6 kg)  11/26/22 157 lb (71.2 kg)     ASSESSMENT & PLAN:   1. RLE DVT, complicated by concerns for melena - patient was recently started on Eliquis for RLE DVT but comes in today for follow-up with BP lower than prior and reports of dark stools, dark brown/black at times, ever since she started this. She had a positive hemoccult in 09/2022 at which time she had decided to defer GI w/u. I am concerned she is experiencing a GIB. This will be difficult to manage in outpatient setting going into the weekend and the need for ongoing anticoagulation for recent DVT. I have spoken with Dr. Angelena Form and we both agree she needs to proceed to ER for evaluation. Per Dr. Angelena Form, she may stop her aspirin. From a CAD/coronary standpoint she otherwise appears clinically stable to undergo GI scope if that is deemed necessary. I would suggest outpatient f/u with PCP after discharge as well to help manage the question of ongoing anticoagulation. The patient declines EMS today. She does not want to go to [facility]. She called her niece Claiborne Billings to meet her at [facility]. I outlined our concerns on her AVS to show to triage. Please contact the cardiology service if we can be of any assistance.  3. CAD, HLD - stable without recent angina. Stop aspirin as above. Can consider decreasing metoprolol or Imdur doses in the hospital if needed based on her BP trends.  Did not make plans for repeat lipid evaluation at this time, can be re-evaluated when acute issue stabilizes. Last LDL 01/2022 at goal.  4. Essential HTN - follow in context as outlined above.  5. CKD 3b with hyperkalemia - recommend recheck labs upon arrival to the hospital.    Disposition: F/u with APP in 1-2 weeks (I am in [place] most of Feb so will likely be my colleague).   Medication Adjustments/Labs and Tests Ordered: Current medicines are reviewed at length with the patient today.  Concerns regarding medicines are outlined above. Medication changes, Labs and Tests ordered today are summarized above and listed in the Patient Instructions accessible in Encounters.   Signed, Charlie Pitter, PA-C  12/24/2022 3:03 PM    [place] Phone: [phone]; Fax: [phone]

## 2022-12-24 ENCOUNTER — Ambulatory Visit: Payer: Medicare PPO | Admitting: Podiatry

## 2022-12-24 ENCOUNTER — Emergency Department (HOSPITAL_COMMUNITY)
Admission: EM | Admit: 2022-12-24 | Discharge: 2022-12-25 | Disposition: A | Discharge status: Home / Self Care | Payer: Medicare PPO | Attending: Student | Admitting: Student

## 2022-12-24 ENCOUNTER — Encounter (HOSPITAL_COMMUNITY): Payer: Self-pay

## 2022-12-24 ENCOUNTER — Ambulatory Visit: Payer: Medicare PPO | Attending: Physician Assistant | Admitting: Physician Assistant

## 2022-12-24 ENCOUNTER — Encounter: Payer: Self-pay | Admitting: Physician Assistant

## 2022-12-24 VITALS — BP 98/62 | HR 73 | Ht 69.0 in | Wt 156.0 lb

## 2022-12-24 DIAGNOSIS — Z79899 Other long term (current) drug therapy: Secondary | ICD-10-CM | POA: Diagnosis not present

## 2022-12-24 DIAGNOSIS — I824Z1 Acute embolism and thrombosis of unspecified deep veins of right distal lower extremity: Secondary | ICD-10-CM | POA: Diagnosis not present

## 2022-12-24 DIAGNOSIS — E875 Hyperkalemia: Secondary | ICD-10-CM

## 2022-12-24 DIAGNOSIS — K625 Hemorrhage of anus and rectum: Secondary | ICD-10-CM | POA: Diagnosis present

## 2022-12-24 DIAGNOSIS — K921 Melena: Secondary | ICD-10-CM

## 2022-12-24 DIAGNOSIS — N1832 Chronic kidney disease, stage 3b: Secondary | ICD-10-CM | POA: Diagnosis not present

## 2022-12-24 DIAGNOSIS — I2510 Atherosclerotic heart disease of native coronary artery without angina pectoris: Secondary | ICD-10-CM | POA: Diagnosis not present

## 2022-12-24 DIAGNOSIS — I10 Essential (primary) hypertension: Secondary | ICD-10-CM

## 2022-12-24 DIAGNOSIS — E785 Hyperlipidemia, unspecified: Secondary | ICD-10-CM | POA: Diagnosis not present

## 2022-12-24 DIAGNOSIS — Z7984 Long term (current) use of oral hypoglycemic drugs: Secondary | ICD-10-CM | POA: Insufficient documentation

## 2022-12-24 DIAGNOSIS — K644 Residual hemorrhoidal skin tags: Secondary | ICD-10-CM | POA: Insufficient documentation

## 2022-12-24 DIAGNOSIS — Z8572 Personal history of non-Hodgkin lymphomas: Secondary | ICD-10-CM | POA: Diagnosis not present

## 2022-12-24 DIAGNOSIS — N183 Chronic kidney disease, stage 3 unspecified: Secondary | ICD-10-CM | POA: Diagnosis not present

## 2022-12-24 DIAGNOSIS — Z7901 Long term (current) use of anticoagulants: Secondary | ICD-10-CM | POA: Insufficient documentation

## 2022-12-24 DIAGNOSIS — E1122 Type 2 diabetes mellitus with diabetic chronic kidney disease: Secondary | ICD-10-CM | POA: Diagnosis not present

## 2022-12-24 DIAGNOSIS — I129 Hypertensive chronic kidney disease with stage 1 through stage 4 chronic kidney disease, or unspecified chronic kidney disease: Secondary | ICD-10-CM | POA: Diagnosis not present

## 2022-12-24 LAB — CBC WITH DIFFERENTIAL/PLATELET
Abs Immature Granulocytes: 0.03 10*3/uL (ref 0.00–0.07)
Basophils Absolute: 0 10*3/uL (ref 0.0–0.1)
Basophils Relative: 0 %
Eosinophils Absolute: 0.1 10*3/uL (ref 0.0–0.5)
Eosinophils Relative: 2 %
HCT: 31.6 % — ABNORMAL LOW (ref 36.0–46.0)
Hemoglobin: 10 g/dL — ABNORMAL LOW (ref 12.0–15.0)
Immature Granulocytes: 1 %
Lymphocytes Relative: 47 %
Lymphs Abs: 2.3 10*3/uL (ref 0.7–4.0)
MCH: 29.6 pg (ref 26.0–34.0)
MCHC: 31.6 g/dL (ref 30.0–36.0)
MCV: 93.5 fL (ref 80.0–100.0)
Monocytes Absolute: 0.5 10*3/uL (ref 0.1–1.0)
Monocytes Relative: 10 %
Neutro Abs: 2 10*3/uL (ref 1.7–7.7)
Neutrophils Relative %: 40 %
Platelets: 251 10*3/uL (ref 150–400)
RBC: 3.38 MIL/uL — ABNORMAL LOW (ref 3.87–5.11)
RDW: 14.6 % (ref 11.5–15.5)
WBC: 4.9 10*3/uL (ref 4.0–10.5)
nRBC: 0 % (ref 0.0–0.2)

## 2022-12-24 LAB — COMPREHENSIVE METABOLIC PANEL
ALT: 17 U/L (ref 0–44)
AST: 35 U/L (ref 15–41)
Albumin: 3.6 g/dL (ref 3.5–5.0)
Alkaline Phosphatase: 31 U/L — ABNORMAL LOW (ref 38–126)
Anion gap: 8 (ref 5–15)
BUN: 22 mg/dL (ref 8–23)
CO2: 25 mmol/L (ref 22–32)
Calcium: 9 mg/dL (ref 8.9–10.3)
Chloride: 106 mmol/L (ref 98–111)
Creatinine, Ser: 1.29 mg/dL — ABNORMAL HIGH (ref 0.44–1.00)
GFR, Estimated: 40 mL/min — ABNORMAL LOW (ref >60)
Glucose, Bld: 97 mg/dL (ref 70–99)
Potassium: 4.5 mmol/L (ref 3.5–5.1)
Sodium: 139 mmol/L (ref 135–145)
Total Bilirubin: 0.6 mg/dL (ref 0.3–1.2)
Total Protein: 6.6 g/dL (ref 6.5–8.1)

## 2022-12-24 LAB — POC OCCULT BLOOD, ED: Fecal Occult Bld: POSITIVE — AB

## 2022-12-24 NOTE — Patient Instructions (Addendum)
Medication Instructions:  1.Stop aspirin *If you need a refill on your cardiac medications before your next appointment, please call your pharmacy*   Follow-Up: At [facility], you and your health needs are our priority.  As part of our continuing mission to provide you with exceptional heart care, we have created designated Provider Care Teams.  These Care Teams include your primary Cardiologist (physician) and Advanced Practice Providers (APPs -  Physician Assistants and Nurse Practitioners) who all work together to provide you with the care you need, when you need it.  Your next appointment:   1-2 week(s)  Provider:   Nicholes Rough, PA-C, Melina Copa, PA-C, Ambrose Pancoast, NP, Ermalinda Barrios, PA-C, Christen Bame, NP, or Richardson Dopp, PA-C        We are sending you to the emergency room due to concern for GI bleeding. You had a positive hemoccult in 09/2022 but wished to defer colonoscopy/endoscopy at that time.  You were recently diagnosed with a right lower extremity DVT and started on Eliquis. Now that you are experiencing lower blood pressure and dark stools, we are recommending you proceed to the emergency room for ASAP evaluation for possible GI bleed.

## 2022-12-24 NOTE — ED Provider Notes (Signed)
[place] EMERGENCY DEPARTMENT AT [facility] Provider Note   CSN: [id number] Arrival date & time: 12/24/22  1613     History {Add pertinent medical, surgical, social history, OB history to HPI:1} Chief Complaint  Patient presents with   Rectal Bleeding    [name] [name] is a 89 y.o. female.  HPI     Home Medications Prior to Admission medications   Medication Sig Start Date End Date Taking? Authorizing Provider  amoxicillin-clavulanate (AUGMENTIN) 875-125 MG tablet Take 1 tablet by mouth 2 (two) times daily. 11/26/22   Pleas Koch, NP  apixaban (ELIQUIS) 5 MG TABS tablet Take 2 tablets (10 mg total) by mouth 2 (two) times daily. For 7 days ( 1 week ) ONLY.  THEN: Take 1 tablet ( 5 mg ) by mouth 2 ( two ) times daily. 12/20/22   Josue Hector, MD  Bempedoic Acid-Ezetimibe (NEXLIZET) 180-10 MG TABS Take 1 tablet by mouth daily. Please keep scheduled appointment for future refills. Thank you. 10/14/22   Burnell Blanks, MD  Blood Glucose Monitoring Suppl DEVI Use up to three times daily to check blood sugar. May substitute to any manufacturer covered by patient's insurance. 12/23/22   Pleas Koch, NP  brimonidine-timolol (COMBIGAN) 0.2-0.5 % ophthalmic solution Place 1 drop into both eyes every 12 (twelve) hours.    [provider]  Cholecalciferol (VITAMIN D-3) 1000 UNITS CAPS Take 1 capsule by mouth daily.    [provider]  diclofenac Sodium (VOLTAREN ARTHRITIS PAIN) 1 % GEL Apply 2 g topically 3 (three) times daily as needed. 08/27/22   Pleas Koch, NP  dorzolamide-timolol (COSOPT) 2-0.5 % ophthalmic solution SMARTSIG:In Eye(s) 09/03/22   [provider]  ezetimibe (ZETIA) 10 MG tablet Take 10 mg by mouth daily.    [provider]  ferrous sulfate 325 (65 FE) MG tablet Take 1 tablet (325 mg total) by mouth daily with breakfast. 10/28/22   Willia Craze, NP  fexofenadine (ALLEGRA) 180 MG tablet Take 1  tablet (180 mg total) by mouth as needed for allergies or rhinitis. 04/03/19   Pleas Koch, NP  gabapentin (NEURONTIN) 100 MG capsule Take 1 capsule (100 mg total) by mouth 3 (three) times daily. 11/03/22   Edrick Kins, DPM  glipiZIDE (GLUCOTROL XL) 10 MG 24 hr tablet TAKE ONE TABLET BY MOUTH EVERY MORNING WITH BREAKFAST FOR DIABETES 08/22/22   Pleas Koch, NP  Glucose Blood (BLOOD GLUCOSE TEST STRIPS) STRP 1 each by In Vitro route in the morning, at noon, and at bedtime. May substitute to any manufacturer covered by patient's insurance. 12/23/22 01/22/23  Pleas Koch, NP  isosorbide mononitrate (IMDUR) 60 MG 24 hr tablet Take 1.5 tablets (90 mg total) by mouth daily. 12/15/22   Burnell Blanks, MD  Lancet Device MISC 1 each by Does not apply route in the morning, at noon, and at bedtime. May substitute to any manufacturer covered by patient's insurance. 12/23/22 01/22/23  Pleas Koch, NP  Lancets Misc. MISC 1 each by Does not apply route in the morning, at noon, and at bedtime. May substitute to any manufacturer covered by patient's insurance. 12/23/22 01/22/23  Pleas Koch, NP  metoprolol succinate (TOPROL-XL) 100 MG 24 hr tablet TAKE ONE TABLET BY MOUTH EVERY MORNING WITH OR IMMEDIATELY FOLLOWING A MEAL 10/13/22   Burnell Blanks, MD  nitroGLYCERIN (NITROSTAT) 0.4 MG SL tablet Place 1 tablet (0.4 mg total) under the tongue every  5 (five) minutes as needed for chest pain. 12/17/22   Burnell Blanks, MD  oseltamivir (TAMIFLU) 75 MG capsule Take 1 capsule (75 mg total) by mouth every 12 (twelve) hours. 11/10/22   Immordino, Annie Main, FNP  pantoprazole (PROTONIX) 20 MG tablet TAKE 1 TABLET BY MOUTH DAILY FOR HEARTBURN 10/13/22   Pleas Koch, NP  sertraline (ZOLOFT) 100 MG tablet TAKE 1 TABLET BY MOUTH DAILY FOR ANXIETY AND DEPRESSION 10/13/22   Pleas Koch, NP  timolol (TIMOPTIC) 0.5 % ophthalmic solution  02/21/18   [provider]   vitamin B-12 (CYANOCOBALAMIN) 1000 MCG tablet Take 1,000 mcg by mouth daily.    [provider]  VYZULTA 0.024 % SOLN Apply 1 drop to eye at bedtime. 06/14/22   [provider]  loratadine (CLARITIN) 10 MG tablet Take 10 mg by mouth as needed.   02/15/12  [provider]      Allergies    Other, Shellfish allergy, Cephalexin, Ciprofloxacin, Clopidogrel bisulfate, Codeine phosphate, Ezetimibe-simvastatin, Hydrocod poli-chlorphe poli er, Lidocaine, Nitrofurantoin, Pregabalin, Propoxyphene n-acetaminophen, Rofecoxib, Sulfamethoxazole, Sulfonamide derivatives, Celecoxib, Codeine, Hydrocod poli-chlorphe poli er, Propoxyphene, and Sulfa antibiotics    Review of Systems   Review of Systems  Physical Exam Updated Vital Signs BP 124/68   Pulse (!) 59   Temp 98 F (36.7 C) (Oral)   Resp 20   SpO2 95%  Physical Exam  ED Results / Procedures / Treatments   Labs (all labs ordered are listed, but only abnormal results are displayed) Labs Reviewed  CBC WITH DIFFERENTIAL/PLATELET - Abnormal; Notable for the following components:      Result Value   RBC 3.38 (*)    Hemoglobin 10.0 (*)    HCT 31.6 (*)    All other components within normal limits  COMPREHENSIVE METABOLIC PANEL - Abnormal; Notable for the following components:   Creatinine, Ser 1.29 (*)    Alkaline Phosphatase 31 (*)    GFR, Estimated 40 (*)    All other components within normal limits    EKG None  Radiology No results found.  Procedures Procedures  {Document cardiac monitor, telemetry assessment procedure when appropriate:1}  Medications Ordered in ED Medications - No data to display  ED Course/ Medical Decision Making/ A&P   {   Click here for ABCD2, HEART and other calculatorsREFRESH Note before signing :1}                          Medical Decision Making  ***  {Document critical care time when appropriate:1} {Document review of labs and clinical decision tools ie heart score,  Chads2Vasc2 etc:1}  {Document your independent review of radiology images, and any outside records:1} {Document your discussion with family members, caretakers, and with consultants:1} {Document social determinants of health affecting pt's care:1} {Document your decision making why or why not admission, treatments were needed:1} Final Clinical Impression(s) / ED Diagnoses Final diagnoses:  None    Rx / DC Orders ED Discharge Orders     None

## 2022-12-24 NOTE — ED Provider Triage Note (Signed)
Emergency Medicine Provider Triage Evaluation Note  [name] [name] , a 89 y.o. female  was evaluated in triage.  Pt was sent by doctor for possible concerns of a GI bleed.  Patient was recently started on Eliquis 4 times per day for a DVT.  Patient has hx of rectosigmoid lymphoma.  Was FOBT+ in 09/2022.  Denies melena, hematochezia, abdominal pain.  Does report some constipation and difficulty passing stools.   Review of Systems  Positive: As above Negative: As above  Physical Exam  BP 132/76 (BP Location: Left Arm)   Pulse 62   Temp 98.5 F (36.9 C) (Oral)   Resp 16   SpO2 93%  Gen:   Awake, no distress   Resp:  Normal effort  MSK:   Moves extremities without difficulty  Other:    Medical Decision Making  Medically screening exam initiated at 5:19 PM.  Appropriate orders placed.  [name] [name] was informed that the remainder of the evaluation will be completed by another provider, this initial triage assessment does not replace that evaluation, and the importance of remaining in the ED until their evaluation is complete.     Theressa Stamps R, [place] 12/24/22 1730

## 2022-12-24 NOTE — ED Triage Notes (Signed)
Pt presents with c/o dark stool, PCP concerned for rectal bleeding.

## 2022-12-25 NOTE — ED Provider Notes (Incomplete)
[place] EMERGENCY DEPARTMENT AT [facility] Provider Note   CSN: [id number] Arrival date & time: 12/24/22  1613     History {Add pertinent medical, surgical, social history, OB history to HPI:1} Chief Complaint  Patient presents with  . Rectal Bleeding    [name] [name] is a 89 y.o. female who presents with her niece Claiborne Billings at the bedside with concern for dark tarry stools  HPI     Home Medications Prior to Admission medications   Medication Sig Start Date End Date Taking? Authorizing Provider  amoxicillin-clavulanate (AUGMENTIN) 875-125 MG tablet Take 1 tablet by mouth 2 (two) times daily. 11/26/22   Pleas Koch, NP  apixaban (ELIQUIS) 5 MG TABS tablet Take 2 tablets (10 mg total) by mouth 2 (two) times daily. For 7 days ( 1 week ) ONLY.  THEN: Take 1 tablet ( 5 mg ) by mouth 2 ( two ) times daily. 12/20/22   Josue Hector, MD  Bempedoic Acid-Ezetimibe (NEXLIZET) 180-10 MG TABS Take 1 tablet by mouth daily. Please keep scheduled appointment for future refills. Thank you. 10/14/22   Burnell Blanks, MD  Blood Glucose Monitoring Suppl DEVI Use up to three times daily to check blood sugar. May substitute to any manufacturer covered by patient's insurance. 12/23/22   Pleas Koch, NP  brimonidine-timolol (COMBIGAN) 0.2-0.5 % ophthalmic solution Place 1 drop into both eyes every 12 (twelve) hours.    [provider]  Cholecalciferol (VITAMIN D-3) 1000 UNITS CAPS Take 1 capsule by mouth daily.    [provider]  diclofenac Sodium (VOLTAREN ARTHRITIS PAIN) 1 % GEL Apply 2 g topically 3 (three) times daily as needed. 08/27/22   Pleas Koch, NP  dorzolamide-timolol (COSOPT) 2-0.5 % ophthalmic solution SMARTSIG:In Eye(s) 09/03/22   [provider]  ezetimibe (ZETIA) 10 MG tablet Take 10 mg by mouth daily.    [provider]  ferrous sulfate 325 (65 FE) MG tablet Take 1 tablet (325 mg total) by mouth daily with  breakfast. 10/28/22   Willia Craze, NP  fexofenadine (ALLEGRA) 180 MG tablet Take 1 tablet (180 mg total) by mouth as needed for allergies or rhinitis. 04/03/19   Pleas Koch, NP  gabapentin (NEURONTIN) 100 MG capsule Take 1 capsule (100 mg total) by mouth 3 (three) times daily. 11/03/22   Edrick Kins, DPM  glipiZIDE (GLUCOTROL XL) 10 MG 24 hr tablet TAKE ONE TABLET BY MOUTH EVERY MORNING WITH BREAKFAST FOR DIABETES 08/22/22   Pleas Koch, NP  Glucose Blood (BLOOD GLUCOSE TEST STRIPS) STRP 1 each by In Vitro route in the morning, at noon, and at bedtime. May substitute to any manufacturer covered by patient's insurance. 12/23/22 01/22/23  Pleas Koch, NP  isosorbide mononitrate (IMDUR) 60 MG 24 hr tablet Take 1.5 tablets (90 mg total) by mouth daily. 12/15/22   Burnell Blanks, MD  Lancet Device MISC 1 each by Does not apply route in the morning, at noon, and at bedtime. May substitute to any manufacturer covered by patient's insurance. 12/23/22 01/22/23  Pleas Koch, NP  Lancets Misc. MISC 1 each by Does not apply route in the morning, at noon, and at bedtime. May substitute to any manufacturer covered by patient's insurance. 12/23/22 01/22/23  Pleas Koch, NP  metoprolol succinate (TOPROL-XL) 100 MG 24 hr tablet TAKE ONE TABLET BY MOUTH EVERY MORNING WITH OR IMMEDIATELY FOLLOWING A MEAL 10/13/22   Burnell Blanks, MD  nitroGLYCERIN (  NITROSTAT) 0.4 MG SL tablet Place 1 tablet (0.4 mg total) under the tongue every 5 (five) minutes as needed for chest pain. 12/17/22   Burnell Blanks, MD  oseltamivir (TAMIFLU) 75 MG capsule Take 1 capsule (75 mg total) by mouth every 12 (twelve) hours. 11/10/22   Immordino, Annie Main, FNP  pantoprazole (PROTONIX) 20 MG tablet TAKE 1 TABLET BY MOUTH DAILY FOR HEARTBURN 10/13/22   Pleas Koch, NP  sertraline (ZOLOFT) 100 MG tablet TAKE 1 TABLET BY MOUTH DAILY FOR ANXIETY AND DEPRESSION 10/13/22   Pleas Koch, NP  timolol (TIMOPTIC) 0.5 % ophthalmic solution  02/21/18   [provider]  vitamin B-12 (CYANOCOBALAMIN) 1000 MCG tablet Take 1,000 mcg by mouth daily.    [provider]  VYZULTA 0.024 % SOLN Apply 1 drop to eye at bedtime. 06/14/22   [provider]  loratadine (CLARITIN) 10 MG tablet Take 10 mg by mouth as needed.   02/15/12  [provider]      Allergies    Other, Shellfish allergy, Cephalexin, Ciprofloxacin, Clopidogrel bisulfate, Codeine phosphate, Ezetimibe-simvastatin, Hydrocod poli-chlorphe poli er, Lidocaine, Nitrofurantoin, Pregabalin, Propoxyphene n-acetaminophen, Rofecoxib, Sulfamethoxazole, Sulfonamide derivatives, Celecoxib, Codeine, Hydrocod poli-chlorphe poli er, Propoxyphene, and Sulfa antibiotics    Review of Systems   Review of Systems  Physical Exam Updated Vital Signs BP 124/68   Pulse (!) 59   Temp 98 F (36.7 C) (Oral)   Resp 20   SpO2 95%  Physical Exam  ED Results / Procedures / Treatments   Labs (all labs ordered are listed, but only abnormal results are displayed) Labs Reviewed  CBC WITH DIFFERENTIAL/PLATELET - Abnormal; Notable for the following components:      Result Value   RBC 3.38 (*)    Hemoglobin 10.0 (*)    HCT 31.6 (*)    All other components within normal limits  COMPREHENSIVE METABOLIC PANEL - Abnormal; Notable for the following components:   Creatinine, Ser 1.29 (*)    Alkaline Phosphatase 31 (*)    GFR, Estimated 40 (*)    All other components within normal limits    EKG None  Radiology No results found.  Procedures Procedures  {Document cardiac monitor, telemetry assessment procedure when appropriate:1}  Medications Ordered in ED Medications - No data to display  ED Course/ Medical Decision Making/ A&P Clinical Course as of 12/24/22 2359  Fri Dec 24, 2022  2356 Consult to pharmacist, Dorian Pod, regarding changing dosage of anticoagulation versus transitioning to new agent.  [RS]     Clinical Course User Index [RS] Ireland Virrueta, Gypsy Balsam, PA-C   {   Click here for ABCD2, HEART and other calculatorsREFRESH Note before signing :1}                          Medical Decision Making  ***  {Document critical care time when appropriate:1} {Document review of labs and clinical decision tools ie heart score, Chads2Vasc2 etc:1}  {Document your independent review of radiology images, and any outside records:1} {Document your discussion with family members, caretakers, and with consultants:1} {Document social determinants of health affecting pt's care:1} {Document your decision making why or why not admission, treatments were needed:1} Final Clinical Impression(s) / ED Diagnoses Final diagnoses:  None    Rx / DC Orders ED Discharge Orders     None

## 2022-12-25 NOTE — Discharge Instructions (Signed)
You were seen in the ER today for your dark tarry stools.  You did have some blood in your stool on your workup today, however blood work was fairly reassuring as were your vital signs and physical exam.  Please decrease your Eliquis dosing to one 5 mg tablet twice per day.  Stop dosing 10 mg (2 tablets) twice per day at this time.  Please follow-up closely outpatient setting with either your primary care doctor or cardiologist within the next week for recheck of your hemoglobin and further discussion of your anticoagulation medication.  Return to the ER if you develop any increase in dark tarry stool, blood per rectum, or if you are feeling shortness of breath, chest pain, palpitations, or any other new severe symptoms.

## 2022-12-27 NOTE — Telephone Encounter (Signed)
Noted  

## 2022-12-27 NOTE — Telephone Encounter (Addendum)
FYI- Called and spoke with patient, scheduled hospital follow up for 12/29/22.

## 2022-12-29 ENCOUNTER — Encounter: Payer: Self-pay | Admitting: Primary Care

## 2022-12-29 ENCOUNTER — Ambulatory Visit: Payer: Medicare PPO | Admitting: Primary Care

## 2022-12-29 VITALS — BP 142/72 | HR 70 | Temp 98.6°F | Ht 69.0 in | Wt 159.0 lb

## 2022-12-29 DIAGNOSIS — I82491 Acute embolism and thrombosis of other specified deep vein of right lower extremity: Secondary | ICD-10-CM

## 2022-12-29 DIAGNOSIS — R195 Other fecal abnormalities: Secondary | ICD-10-CM | POA: Insufficient documentation

## 2022-12-29 DIAGNOSIS — I82409 Acute embolism and thrombosis of unspecified deep veins of unspecified lower extremity: Secondary | ICD-10-CM | POA: Insufficient documentation

## 2022-12-29 LAB — CBC
HCT: 29.6 % — ABNORMAL LOW (ref 36.0–46.0)
Hemoglobin: 9.8 g/dL — ABNORMAL LOW (ref 12.0–15.0)
MCHC: 33 g/dL (ref 30.0–36.0)
MCV: 90.9 fl (ref 78.0–100.0)
Platelets: 242 10*3/uL (ref 150.0–400.0)
RBC: 3.26 Mil/uL — ABNORMAL LOW (ref 3.87–5.11)
RDW: 15.7 % — ABNORMAL HIGH (ref 11.5–15.5)
WBC: 3.5 10*3/uL — ABNORMAL LOW (ref 4.0–10.5)

## 2022-12-29 LAB — IBC + FERRITIN
Ferritin: 13.6 ng/mL (ref 10.0–291.0)
Iron: 48 ug/dL (ref 42–145)
Saturation Ratios: 13 % — ABNORMAL LOW (ref 20.0–50.0)
TIBC: 369.6 ug/dL (ref 250.0–450.0)
Transferrin: 264 mg/dL (ref 212.0–360.0)

## 2022-12-29 NOTE — Patient Instructions (Signed)
Stop by the lab prior to leaving today. I will notify you of your results once received.   Continue Eliquis 5 mg twice daily.  It was a pleasure to see you today!

## 2022-12-29 NOTE — Assessment & Plan Note (Signed)
Likely secondary to recent anticoagulation initiation. Continue Eliquis 5 mg BID.  Repeat CBC pending today.  Reviewed ED notes and labs today.  HPI and exam today reassuring.

## 2022-12-29 NOTE — Assessment & Plan Note (Signed)
New diagnosis. Reviewed vascular studies from 12/20/22. Reviewed ED notes, labs from 12/24/22.  Continue Eliquis 5 mg BID. Follow up with cardiology as scheduled.   Repeat CBC pending.

## 2022-12-29 NOTE — Progress Notes (Signed)
Subjective:    Patient ID: [name] [name], female    DOB: 1932-11-20, 89 y.o.   MRN: [id number]  HPI  [name] [name] is a very pleasant 89 y.o. female with a history of hypertension, CAD, type 2 diabetes, chronic fatigue, anxiety and depression, renal insufficiency who presents today for ED follow-up.  She presented to [facility] long ED on 12/24/2022 for a 1 day history of dark, tarry stools.  Diagnosed with DVT on 12/20/2022 and was prescribed Eliquis 10 mg twice daily x 7 days with transition to Eliquis 5 mg twice daily thereafter.  During her stay in the ED she underwent lab work which revealed slight decrease in hemoglobin from baseline and positive Hemoccult stool card.  She was hemodynamically stable, did not require blood transfusion.  Her case was discussed with the pharmacist who recommended reduction of Eliquis to 5 mg twice daily and repeat CBC.  She was discharged home later that evening.  Since her ED visit she has reduced her Eliquis to 5 mg BID. She denies dark, tarry stools, fatigue, palpitations, bright red rectal bleeding. Her right lower extremity swelling has reduced.   BP Readings from Last 3 Encounters:  12/29/22 (!) 142/72  12/25/22 137/74  12/24/22 98/62      Review of Systems  Constitutional:  Negative for fatigue.  Cardiovascular:  Positive for leg swelling.  Gastrointestinal:  Negative for abdominal pain and blood in stool.         Past Medical History:  Diagnosis Date   Acute bilateral thoracic back pain 08/16/2019   Allergy    Cipro, Plavix, Statins   Anemia    Arthritis    CAD (coronary artery disease)    a. s/p tandem Promus DES to LAD in 2009 by Dr. Olevia Perches;  b.  LHC (03/22/14):  prox LAD 30%, mid LAD 40%, LAD stent ok with dist 20% ISR, apical LAD occluded with L-L collats filling apical vessel (too small for PCI), mid RCA 20%, EF 70%.  Med Rx   Cancer [facility])    Chronic kidney disease, stage 3b ([place])    Colon polyps    Diabetes mellitus    Diagnosed  2012   Diverticulosis of colon (without mention of hemorrhage)    DVT (deep venous thrombosis) (HCC)    GERD (gastroesophageal reflux disease)    Glaucoma    Herpes zoster without complication 56/81/2751   Hyperlipidemia    intol of statins   Hypertension    Kidney stones    NHL (non-Hodgkin's lymphoma) ([place]) dx'd 2003   chemo/xrt comp 2003   Personal history of colonic polyps    Proctitis    Pruritic intertrigo 07/18/2014   Urticaria     Social History   Socioeconomic History   Marital status: Widowed    Spouse name: Not on file   Number of children: 0   Years of education: Not on file   Highest education level: Not on file  Occupational History   Occupation: Retired    Fish farm manager: RETIRED   Occupation: retired  Tobacco Use   Smoking status: Never   Smokeless tobacco: Never  Vaping Use   Vaping Use: Never used  Substance and Sexual Activity   Alcohol use: No   Drug use: No   Sexual activity: Not Currently  Other Topics Concern   Not on file  Social History Narrative   Not on file   Social Determinants of Health   Financial Resource Strain: Low Risk  (06/25/2022)  Overall Financial Resource Strain (CARDIA)    Difficulty of Paying Living Expenses: Not hard at all  Food Insecurity: No Food Insecurity (06/25/2022)   Hunger Vital Sign    Worried About Running Out of Food in the Last Year: Never true    Ran Out of Food in the Last Year: Never true  Transportation Needs: No Transportation Needs (06/25/2022)   PRAPARE - Hydrologist (Medical): No    Lack of Transportation (Non-Medical): No  Physical Activity: Inactive (06/25/2022)   Exercise Vital Sign    Days of Exercise per Week: 0 days    Minutes of Exercise per Session: 0 min  Stress: No Stress Concern Present (06/25/2022)   [place]    Feeling of Stress : Not at all  Social Connections: Not on file  Intimate Partner  Violence: Not At Risk (06/23/2021)   Humiliation, Afraid, Rape, and Kick questionnaire    Fear of Current or Ex-Partner: No    Emotionally Abused: No    Physically Abused: No    Sexually Abused: No    Past Surgical History:  Procedure Laterality Date   cardiac cath-neg     CORONARY ANGIOPLASTY WITH STENT PLACEMENT     x 2   dexa-neg     KNEE SURGERY  10/2003   left   laser surgery for cataracts-left     LEFT HEART CATHETERIZATION WITH CORONARY ANGIOGRAM N/A 03/22/2014   Procedure: LEFT HEART CATHETERIZATION WITH CORONARY ANGIOGRAM;  Surgeon: Burnell Blanks, MD;  [facility] C[facility] LAB;  Service: Cardiovascular;  Laterality: N/A;   stress cardiolite     VAGINAL HYSTERECTOMY     partial, fibroids, one ovary left    Family History  Problem Relation Age of Onset   Cancer Mother        jaw   Hypertension Father    Heart attack Father    Heart attack Sister    Colon cancer Neg Hx     Allergies  Allergen Reactions   Other Rash and Anaphylaxis   Shellfish Allergy Anaphylaxis and Rash   Cephalexin     REACTION: trash and swelling REACTION: trash and swelling   Ciprofloxacin     REACTION: u/k   Clopidogrel Bisulfate     REACTION: swelling, rash   Codeine Phosphate     REACTION: unspecified   Ezetimibe-Simvastatin     REACTION: myalgias REACTION: myalgias   Hydrocod Poli-Chlorphe Poli Er     REACTION: u/k   Lidocaine Hives    ONLY TO ADHESIVE LIDOCAINE PATCHES. NO PROBLEM WITH INJECTABLE LIDOCAINE.   Nitrofurantoin     REACTION: [place] REACTION: [place]   Pregabalin     REACTION: felt bad REACTION: felt bad   Propoxyphene N-Acetaminophen     REACTION: [place]   Rofecoxib     unk unk    Sulfamethoxazole     REACTION: unspecified   Sulfonamide Derivatives     REACTION: unspecified   Celecoxib Rash    REACTION: [place] REACTION: [place]   Codeine Rash    REACTION: [place] REACTION: [place]   Hydrocod Poli-Chlorphe Poli Er Rash   Propoxyphene Rash   Sulfa Antibiotics Rash     REACTION: unspecified REACTION: unspecified    Current Outpatient Medications on File Prior to Visit  Medication Sig Dispense Refill   apixaban (ELIQUIS) 5 MG TABS tablet Take 2 tablets (10 mg total) by mouth 2 (two) times daily. For 7 days (  1 week ) ONLY.  THEN: Take 1 tablet ( 5 mg ) by mouth 2 ( two ) times daily. 60 tablet 5   Bempedoic Acid-Ezetimibe (NEXLIZET) 180-10 MG TABS Take 1 tablet by mouth daily. Please keep scheduled appointment for future refills. Thank you. 90 tablet 0   Blood Glucose Monitoring Suppl DEVI Use up to three times daily to check blood sugar. May substitute to any manufacturer covered by patient's insurance. 1 each 0   brimonidine-timolol (COMBIGAN) 0.2-0.5 % ophthalmic solution Place 1 drop into both eyes every 12 (twelve) hours.     Cholecalciferol (VITAMIN D-3) 1000 UNITS CAPS Take 1 capsule by mouth daily.     diclofenac Sodium (VOLTAREN ARTHRITIS PAIN) 1 % GEL Apply 2 g topically 3 (three) times daily as needed. 100 g 0   dorzolamide-timolol (COSOPT) 2-0.5 % ophthalmic solution SMARTSIG:In Eye(s)     ezetimibe (ZETIA) 10 MG tablet Take 10 mg by mouth daily.     fexofenadine (ALLEGRA) 180 MG tablet Take 1 tablet (180 mg total) by mouth as needed for allergies or rhinitis. 90 tablet 0   gabapentin (NEURONTIN) 100 MG capsule Take 1 capsule (100 mg total) by mouth 3 (three) times daily. 180 capsule 1   glipiZIDE (GLUCOTROL XL) 10 MG 24 hr tablet TAKE ONE TABLET BY MOUTH EVERY MORNING WITH BREAKFAST FOR DIABETES 90 tablet 1   Glucose Blood (BLOOD GLUCOSE TEST STRIPS) STRP 1 each by In Vitro route in the morning, at noon, and at bedtime. May substitute to any manufacturer covered by patient's insurance. 300 strip 5   isosorbide mononitrate (IMDUR) 60 MG 24 hr tablet Take 1.5 tablets (90 mg total) by mouth daily. 135 tablet 1   Lancet Device MISC 1 each by Does not apply route in the morning, at noon, and at bedtime. May substitute to any manufacturer covered by  patient's insurance. 1 each 0   Lancets Misc. MISC 1 each by Does not apply route in the morning, at noon, and at bedtime. May substitute to any manufacturer covered by patient's insurance. 300 each 5   metoprolol succinate (TOPROL-XL) 100 MG 24 hr tablet TAKE ONE TABLET BY MOUTH EVERY MORNING WITH OR IMMEDIATELY FOLLOWING A MEAL 30 tablet 4   nitroGLYCERIN (NITROSTAT) 0.4 MG SL tablet Place 1 tablet (0.4 mg total) under the tongue every 5 (five) minutes as needed for chest pain. 25 tablet 3   pantoprazole (PROTONIX) 20 MG tablet TAKE 1 TABLET BY MOUTH DAILY FOR HEARTBURN 90 tablet 2   sertraline (ZOLOFT) 100 MG tablet TAKE 1 TABLET BY MOUTH DAILY FOR ANXIETY AND DEPRESSION 90 tablet 2   timolol (TIMOPTIC) 0.5 % ophthalmic solution      vitamin B-12 (CYANOCOBALAMIN) 1000 MCG tablet Take 1,000 mcg by mouth daily.     VYZULTA 0.024 % SOLN Apply 1 drop to eye at bedtime.     amoxicillin-clavulanate (AUGMENTIN) 875-125 MG tablet Take 1 tablet by mouth 2 (two) times daily. (Patient not taking: Reported on 12/29/2022) 14 tablet 0   ferrous sulfate 325 (65 FE) MG tablet Take 1 tablet (325 mg total) by mouth daily with breakfast. (Patient not taking: Reported on 12/29/2022) 30 tablet 2   oseltamivir (TAMIFLU) 75 MG capsule Take 1 capsule (75 mg total) by mouth every 12 (twelve) hours. (Patient not taking: Reported on 12/29/2022) 10 capsule 0   [DISCONTINUED] loratadine (CLARITIN) 10 MG tablet Take 10 mg by mouth as needed.      No current facility-administered medications on file  prior to visit.    BP (!) 142/72   Pulse 70   Temp 98.6 F (37 C) (Temporal)   Ht '5\' 9"'$  (1.753 m)   Wt 159 lb (72.1 kg)   SpO2 97%   BMI 23.48 kg/m  Objective:   Physical Exam Cardiovascular:     Rate and Rhythm: Normal rate and regular rhythm.  Pulmonary:     Effort: Pulmonary effort is normal.     Breath sounds: Normal breath sounds.  Musculoskeletal:     Cervical back: Neck supple.  Skin:    General: Skin is warm  and dry.           Assessment & Plan:  Deep vein thrombosis (DVT) of other vein of right lower extremity, unspecified chronicity (HCC) Assessment & Plan: New diagnosis. Reviewed vascular studies from 12/20/22. Reviewed ED notes, labs from 12/24/22.  Continue Eliquis 5 mg BID. Follow up with cardiology as scheduled.   Repeat CBC pending.   Dark stools Assessment & Plan: Likely secondary to recent anticoagulation initiation. Continue Eliquis 5 mg BID.  Repeat CBC pending today.  Reviewed ED notes and labs today.  HPI and exam today reassuring.   Orders: -     CBC -     IBC + Ferritin        Pleas Koch, NP

## 2022-12-30 DIAGNOSIS — I82409 Acute embolism and thrombosis of unspecified deep veins of unspecified lower extremity: Secondary | ICD-10-CM

## 2022-12-30 HISTORY — DX: Acute embolism and thrombosis of unspecified deep veins of unspecified lower extremity: I82.409

## 2022-12-31 DIAGNOSIS — R195 Other fecal abnormalities: Secondary | ICD-10-CM

## 2022-12-31 DIAGNOSIS — D649 Anemia, unspecified: Secondary | ICD-10-CM

## 2023-01-03 NOTE — Progress Notes (Unsigned)
Cardiology Office Note:    Date:  01/05/2023   ID:  [name] [name], DOB [birth year], MRN [id number]  PCP:  [name] Koch, NP   [facility] Providers Cardiologist:  Lauree Chandler, MD     Referring MD: [name] Koch, NP   Chief Complaint: follow-up GIB, hypotension  History of Present Illness:    [name] [name] is a very pleasant 89 y.o. female with a hx of CAD (tandem DES to LAD 2009), HLD, HTN, non-Hodgkin's lymphoma, diabetes, CKD stage IIIb, mild aortic regurgitation, recent RLE DVT, positive hemoccult 09/2022  History of prior PCI in 2009 as noted with last cath 2015 with patent LAD stents, occluded apical LAD segment with left to left collaterals and nonobstructive disease elsewhere, last NST 2017 without ischemia, with h/o stable angina controlled with Imdur.  She has been intolerant to statins and Plavix in the past.  She has been on Zetia.  She had bilateral foot pain and normal ABI in 2016.  Most recent echo 03/2022 EF 60 to 65%, mild concentric LVH, severe basal septal thickness, G1 DD, moderate LAE, mild AI, trivial pericardial effusion.  Seen in cardiology clinic for routine follow-up 12/17/2022 with no change in chronic chest pains.  Due to right leg edema, LE venous duplex was arranged -patient deferred until 12/20/2022 which showed " findings consistent with age-indeterminate deep vein thrombosis involving the right soleal veins."  She was started on DVT dosing of Eliquis and seen by [name] Copa, PA on 12/24/2022 for follow-up.  Of note she had flu a in 10/2022 and was very ill for 3 weeks with lots of bedrest.  Also seen by GI 09/2022 for positive FOBT and mild anemia -patient had send sent to proceed with EGD/colonoscopy at that time, she wished to defer.  At ov 12/24/22, she denied overt chest pain, shortness of breath, palpitations, dizziness, or syncope.  Her RLE edema has improved.  BP was soft at 104/62 and 98/62.  She reported dark stools since starting Eliquis.  Case discussed with Dr. Angelena Form who agreed that patient needed to be admitted for concerns of GIB. She was advised to stop her aspirin. In ED, hbg 10.0, down from 11.1 on 12/20/22. She had positive Hemoccult stool card. Eliquis was decreased from 10 mg to 5 mg twice daily. Felt to be hemodynamically stable, did not require blood transfusion.  She continued to refuse invasive procedures, and was advised to follow-up as an outpatient.   Today, she is here alone for follow-up. Reports she is overall feeling well. Did not feel that going to ED on 1/26 was necessary. She has never had low BP in her [name] and does not believe that it was as low as reported. I reviewed the concern that hypotension was caused by bleeding. Seen by PCP on 12/29/22 and referred to GI. Hemoglobin was further decreased. She was referred to GI. Denies BRBPR.  Has some lightheadedness with standing up suddenly which resolves after a few seconds. No syncope. Main concern is  bilateral knee pain and swelling in knees, worst at end of the day. Also notes occasional feet swelling. States she was told it was arthritis and surgery was not needed. Denies frequent chest pain. Shortness of breath after about an hour of heavier house work such as vacuuming, mopping. No SL NTG in one year. 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 She denies fatigue, palpitations, melena, hematuria, hemoptysis, diaphoresis, weakness, presyncope, syncope, orthopnea, and PND.   Past Medical History:  Diagnosis Date   Acute bilateral thoracic back pain 08/16/2019   Allergy    Cipro, Plavix, Statins   Anemia    Arthritis    CAD (coronary artery disease)    a. s/p tandem Promus DES to LAD in 2009 by Dr. Olevia Perches;  b.  LHC (03/22/14):  prox LAD 30%, mid LAD 40%, LAD stent ok with dist 20% ISR, apical LAD occluded with L-L collats filling apical vessel (too small for  PCI), mid RCA 20%, EF 70%.  Med Rx   Cancer [facility])    Chronic kidney disease, stage 3b ([place])    Colon polyps    Diabetes mellitus    Diagnosed 2012   Diverticulosis of colon (without mention of hemorrhage)    DVT (deep venous thrombosis) (HCC)    GERD (gastroesophageal reflux disease)    Glaucoma    Herpes zoster without complication 41/32/4401   Hyperlipidemia    intol of statins   Hypertension    Kidney stones    NHL (non-Hodgkin's lymphoma) ([place]) dx'd 2003   chemo/xrt comp 2003   Personal history of colonic polyps    Proctitis    Pruritic intertrigo 07/18/2014   Urticaria     Past Surgical History:  Procedure Laterality Date   cardiac cath-neg     CORONARY ANGIOPLASTY WITH STENT PLACEMENT     x 2   dexa-neg     KNEE SURGERY  10/2003   left   laser surgery for cataracts-left     LEFT HEART CATHETERIZATION WITH CORONARY ANGIOGRAM N/A 03/22/2014   Procedure: LEFT HEART CATHETERIZATION WITH CORONARY ANGIOGRAM;  Surgeon: Burnell Blanks, MD;  Location: [facility] CATH LAB;  Service: Cardiovascular;  Laterality: N/A;   stress cardiolite     VAGINAL HYSTERECTOMY     partial, fibroids, one ovary left    Current Medications: Current Meds  Medication Sig   apixaban (ELIQUIS) 5 MG TABS tablet Take 2 tablets (10 mg total) by mouth 2 (two) times daily. For 7 days ( 1 week ) ONLY.  THEN: Take 1 tablet ( 5 mg ) by mouth 2 ( two ) times daily.   Bempedoic Acid-Ezetimibe (NEXLIZET) 180-10 MG TABS Take 1 tablet by mouth daily. Please keep scheduled appointment for future refills. Thank you.   Blood Glucose Monitoring Suppl DEVI Use up to three times daily to check blood sugar. May substitute to any manufacturer covered by patient's insurance.   brimonidine-timolol (COMBIGAN) 0.2-0.5 % ophthalmic solution Place 1 drop into both eyes every 12 (twelve) hours.   Cholecalciferol (VITAMIN D-3) 1000 UNITS CAPS Take 1 capsule by mouth daily.   diclofenac Sodium (VOLTAREN ARTHRITIS PAIN) 1 % GEL  Apply 2 g topically 3 (three) times daily as needed.   dorzolamide-timolol (COSOPT) 2-0.5 % ophthalmic solution SMARTSIG:In Eye(s)   ezetimibe (ZETIA) 10 MG tablet Take 10 mg by mouth daily.   ferrous sulfate 325 (65 FE) MG tablet Take 1 tablet (325 mg total) by mouth daily with breakfast.   fexofenadine (ALLEGRA) 180 MG tablet Take 1 tablet (180 mg total) by mouth as needed for allergies or rhinitis.   gabapentin (NEURONTIN) 100 MG capsule Take 1 capsule (100 mg total) by mouth 3 (three) times daily.   glipiZIDE (GLUCOTROL XL) 10 MG 24 hr tablet TAKE ONE TABLET BY MOUTH EVERY MORNING WITH BREAKFAST FOR DIABETES   Glucose Blood (BLOOD GLUCOSE TEST  STRIPS) STRP 1 each by In Vitro route in the morning, at noon, and at bedtime. May substitute to any manufacturer covered by patient's insurance.   isosorbide mononitrate (IMDUR) 60 MG 24 hr tablet Take 1.5 tablets (90 mg total) by mouth daily.   Lancet Device MISC 1 each by Does not apply route in the morning, at noon, and at bedtime. May substitute to any manufacturer covered by patient's insurance.   Lancets Misc. MISC 1 each by Does not apply route in the morning, at noon, and at bedtime. May substitute to any manufacturer covered by patient's insurance.   metoprolol succinate (TOPROL-XL) 100 MG 24 hr tablet TAKE ONE TABLET BY MOUTH EVERY MORNING WITH OR IMMEDIATELY FOLLOWING A MEAL   nitroGLYCERIN (NITROSTAT) 0.4 MG SL tablet Place 1 tablet (0.4 mg total) under the tongue every 5 (five) minutes as needed for chest pain.   oseltamivir (TAMIFLU) 75 MG capsule Take 1 capsule (75 mg total) by mouth every 12 (twelve) hours.   pantoprazole (PROTONIX) 20 MG tablet TAKE 1 TABLET BY MOUTH DAILY FOR HEARTBURN   sertraline (ZOLOFT) 100 MG tablet TAKE 1 TABLET BY MOUTH DAILY FOR ANXIETY AND DEPRESSION   timolol (TIMOPTIC) 0.5 % ophthalmic solution    vitamin B-12 (CYANOCOBALAMIN) 1000 MCG tablet Take 1,000 mcg by mouth daily.   VYZULTA 0.024 % SOLN Apply 1 drop  to eye at bedtime.     Allergies:   Other, Shellfish allergy, Cephalexin, Ciprofloxacin, Clopidogrel bisulfate, Codeine phosphate, Ezetimibe-simvastatin, Hydrocod poli-chlorphe poli er, Lidocaine, Nitrofurantoin, Pregabalin, Propoxyphene n-acetaminophen, Rofecoxib, Sulfamethoxazole, Sulfonamide derivatives, Celecoxib, Codeine, Hydrocod poli-chlorphe poli er, Propoxyphene, and Sulfa antibiotics   Social History   Socioeconomic History   Marital status: Widowed    Spouse name: Not on file   Number of children: 0   Years of education: Not on file   Highest education level: Not on file  Occupational History   Occupation: Retired    Fish farm manager: RETIRED   Occupation: retired  Tobacco Use   Smoking status: Never   Smokeless tobacco: Never  Scientific laboratory technician Use: Never used  Substance and Sexual Activity   Alcohol use: No   Drug use: No   Sexual activity: Not Currently  Other Topics Concern   Not on file  Social History Narrative   Not on file   Social Determinants of Health   Financial Resource Strain: Low Risk  (06/25/2022)   Overall Financial Resource Strain (CARDIA)    Difficulty of Paying Living Expenses: Not hard at all  Food Insecurity: No Food Insecurity (06/25/2022)   Hunger Vital Sign    Worried About Running Out of Food in the Last Year: Never true    [place] in the Last Year: Never true  Transportation Needs: No Transportation Needs (06/25/2022)   PRAPARE - Hydrologist (Medical): No    Lack of Transportation (Non-Medical): No  Physical Activity: Inactive (06/25/2022)   Exercise Vital Sign    Days of Exercise per Week: 0 days    Minutes of Exercise per Session: 0 min  Stress: No Stress Concern Present (06/25/2022)   [place]    Feeling of Stress : Not at all  Social Connections: Not on file     Family History: The patient's family history includes Cancer in her  mother; Heart attack in her father and sister; Hypertension in her father. There is no history of Colon cancer.  ROS:   Please see the history of present illness.   + bilateral knee pain All other systems reviewed and are negative.  Labs/Other Studies Reviewed:    The following studies were reviewed today:  VAS [place] LE (DVT) 12/20/22 Summary:  RIGHT:  - Findings consistent with age indeterminate deep vein thrombosis  involving the right soleal veins.  - No cystic structure found in the popliteal fossa.    LEFT:  - No evidence of common femoral vein obstruction.    *See table(s) above for measurements and observations.  Echo 04/07/22  1. Left ventricular ejection fraction, by estimation, is 60 to 65%. The  left ventricle has normal function. The left ventricle has no regional  wall motion abnormalities. There is mild concentric left ventricular  hypertrophy of the with severe basal  septal thickness. Left ventricular diastolic parameters are consistent  with Grade I diastolic dysfunction (impaired relaxation).   2. Right ventricular systolic function is normal. The right ventricular  size is normal.   3. Left atrial size was moderately dilated.   4. The pericardial effusion is posterior to the left ventricle. There is  no evidence of cardiac tamponade.   5. The mitral valve is normal in structure. No evidence of mitral valve  regurgitation. No evidence of mitral stenosis.   6. The aortic valve is tricuspid. Aortic valve regurgitation is mild.  Aortic valve sclerosis is present, with no evidence of aortic valve  stenosis.   7. The inferior vena cava is normal in size with greater than 50%  respiratory variability, suggesting right atrial pressure of 3 mmHg.   Lexiscan Myoview 09/22/16 Nuclear stress EF: 76%. The left ventricular ejection fraction is hyperdynamic (>65%). There was no ST segment deviation noted during stress. The study is normal. This is a low risk  study.  Recent Labs: 12/24/2022: ALT 17; BUN 22; Creatinine, Ser 1.29; Potassium 4.5; Sodium 139 12/29/2022: Hemoglobin 9.8; Platelets 242.0  Recent Lipid Panel    Component Value Date/Time   CHOL 123 02/01/2022 0853   TRIG 226 (H) 02/01/2022 0853   HDL 33 (L) 02/01/2022 0853   CHOLHDL 3.7 02/01/2022 0853   CHOLHDL 4 07/02/2021 1121   VLDL 34.6 07/02/2021 1121   LDLCALC 54 02/01/2022 0853   LDLDIRECT 93.0 08/06/2015 0847     Risk Assessment/Calculations:      Physical Exam:    VS:  BP 128/62   Pulse 71   Ht '5\' 9"'$  (1.753 m)   Wt 159 lb (72.1 kg)   SpO2 95%   BMI 23.48 kg/m     Wt Readings from Last 3 Encounters:  01/05/23 159 lb (72.1 kg)  12/29/22 159 lb (72.1 kg)  12/24/22 156 lb (70.8 kg)     GEN: Well nourished, well developed in no acute distress HEENT: Normal NECK: No JVD; No carotid bruits CARDIAC: RRR, 2/6 holosystolic murmur. No rubs, gallops RESPIRATORY:  Clear to auscultation without rales, wheezing or rhonchi  ABDOMEN: Soft, non-tender, non-distended MUSCULOSKELETAL:  No edema; No deformity. 2+ pedal pulses, equal bilaterally SKIN: Warm and dry NEUROLOGIC:  Alert and oriented x 3 PSYCHIATRIC:  Normal affect   EKG:  EKG is not ordered today  Diagnoses:    1. Melena   2. Anemia, unspecified type   3. Right leg swelling   4. Deep vein thrombosis (DVT) of other vein of right lower extremity, unspecified chronicity (HCC)   5. Coronary artery disease involving native coronary artery of native heart without angina pectoris  6. Nonrheumatic aortic valve insufficiency   7. Essential hypertension   8. Stage 3b chronic kidney disease ([place])    Assessment and Plan:     GIB/Anemia: Hgb 9.8 on 12/29/22. History of positive hemoccult, dark stools. We discussed the risks of bleeding versus need for [facility] for DVT. She verbalized understanding. Feels well, no BRBPR. Occasional lightheadedness when she stands too quickly, resolves within a few seconds. Has  appointment with GI for March. Management per PCP.   DVT: Recent diagnosis of RLE DVT 12/20/22, was started on Eliquis DVT protocol which was decreased after 4 days to 5 mg twice daily. As noted above, anemia noted on lab results, no evidence of overt bleeding.  She continues to have pain in RLE. She remains active. Advised elevation when sedentary.  Continue Eliquis 5 mg twice daily.  CAD without angina: History of prior PCI in 2009 with last cath 2015 with patent LAD stents, occluded apical LAD segment with left to left collaterals and nonobstructive disease elsewhere, last NST 2017 without ischemia, with h/o stable angina controlled with Imdur. She denies chest pain, dyspnea, or other symptoms concerning for angina.  No use of ntg x 1 year. Remains active with walking, house and yard work. No indication for further ischemic evaluation at this time. Intolerant of statins and Plavix. Continue metoprolol, Imdur, ezetimibe. No aspirin in setting of [place].   Aortic valve insufficiency: Mild aortic insufficiency on echo 03/2022 with no significant change from previous echo. Asymptomatic.  Will continue to monitor clinically for now.  Hypertension: BP is well controlled. Does not monitor at home.   CKD Stage 3b: Scr 1.29 and electrolytes stable on lab results 12/24/22.   Hyperlipidemia LDL goal < 70: LDL 54 on 02/01/22. Continue ezetimibe.      Disposition: 6 months with Dr. Angelena Form  Medication Adjustments/Labs and Tests Ordered: Current medicines are reviewed at length with the patient today.  Concerns regarding medicines are outlined above.  No orders of the defined types were placed in this encounter.  No orders of the defined types were placed in this encounter.   Patient Instructions  Medication Instructions:   Your physician recommends that you continue on your current medications as directed. Please refer to the Current Medication list given to you today.   *If you need a refill on your  cardiac medications before your next appointment, please call your pharmacy*   Lab Work:  None ordered.  If you have labs (blood work) drawn today and your tests are completely normal, you will receive your results only by: [place] (if you have MyChart) OR A paper copy in the mail If you have any lab test that is abnormal or we need to change your treatment, we will call you to review the results.   Testing/Procedures:  None ordered.   Follow-Up: At [facility], you and your health needs are our priority.  As part of our continuing mission to provide you with exceptional heart care, we have created designated Provider Care Teams.  These Care Teams include your primary Cardiologist (physician) and Advanced Practice Providers (APPs -  Physician Assistants and Nurse Practitioners) who all work together to provide you with the care you need, when you need it.  We recommend signing up for the patient portal called "MyChart".  Sign up information is provided on this After Visit Summary.  MyChart is used to connect with patients for Virtual Visits (Telemedicine).  Patients are able to view lab/test results, encounter notes,  upcoming appointments, etc.  Non-urgent messages can be sent to your provider as well.   To learn more about what you can do with MyChart, go to [email or web address].    Your next appointment:   6 month(s)  Provider:   Lauree Chandler, MD        Signed, [name] Life, NP  01/05/2023 12:01 PM    [place]

## 2023-01-05 ENCOUNTER — Ambulatory Visit: Payer: Medicare PPO | Attending: Nurse Practitioner | Admitting: Nurse Practitioner

## 2023-01-05 ENCOUNTER — Encounter: Payer: Self-pay | Admitting: Nurse Practitioner

## 2023-01-05 VITALS — BP 128/62 | HR 71 | Ht 69.0 in | Wt 159.0 lb

## 2023-01-05 DIAGNOSIS — I351 Nonrheumatic aortic (valve) insufficiency: Secondary | ICD-10-CM | POA: Diagnosis not present

## 2023-01-05 DIAGNOSIS — K921 Melena: Secondary | ICD-10-CM

## 2023-01-05 DIAGNOSIS — M7989 Other specified soft tissue disorders: Secondary | ICD-10-CM | POA: Diagnosis not present

## 2023-01-05 DIAGNOSIS — N1832 Chronic kidney disease, stage 3b: Secondary | ICD-10-CM

## 2023-01-05 DIAGNOSIS — I2510 Atherosclerotic heart disease of native coronary artery without angina pectoris: Secondary | ICD-10-CM | POA: Diagnosis not present

## 2023-01-05 DIAGNOSIS — I10 Essential (primary) hypertension: Secondary | ICD-10-CM

## 2023-01-05 DIAGNOSIS — D649 Anemia, unspecified: Secondary | ICD-10-CM

## 2023-01-05 DIAGNOSIS — I82491 Acute embolism and thrombosis of other specified deep vein of right lower extremity: Secondary | ICD-10-CM | POA: Diagnosis not present

## 2023-01-05 NOTE — Patient Instructions (Signed)
Medication Instructions:   Your physician recommends that you continue on your current medications as directed. Please refer to the Current Medication list given to you today.   *If you need a refill on your cardiac medications before your next appointment, please call your pharmacy*   Lab Work:  None ordered.  If you have labs (blood work) drawn today and your tests are completely normal, you will receive your results only by: [place] (if you have MyChart) OR A paper copy in the mail If you have any lab test that is abnormal or we need to change your treatment, we will call you to review the results.   Testing/Procedures:  None ordered.   Follow-Up: At [facility], you and your health needs are our priority.  As part of our continuing mission to provide you with exceptional heart care, we have created designated Provider Care Teams.  These Care Teams include your primary Cardiologist (physician) and Advanced Practice Providers (APPs -  Physician Assistants and Nurse Practitioners) who all work together to provide you with the care you need, when you need it.  We recommend signing up for the patient portal called "MyChart".  Sign up information is provided on this After Visit Summary.  MyChart is used to connect with patients for Virtual Visits (Telemedicine).  Patients are able to view lab/test results, encounter notes, upcoming appointments, etc.  Non-urgent messages can be sent to your provider as well.   To learn more about what you can do with MyChart, go to [email or web address].    Your next appointment:   6 month(s)  Provider:   Lauree Chandler, MD

## 2023-01-07 ENCOUNTER — Telehealth: Payer: Self-pay | Admitting: *Deleted

## 2023-01-12 ENCOUNTER — Other Ambulatory Visit: Payer: Self-pay | Admitting: Cardiovascular Disease

## 2023-01-17 ENCOUNTER — Other Ambulatory Visit: Payer: Self-pay | Admitting: Cardiovascular Disease

## 2023-01-17 ENCOUNTER — Telehealth: Payer: Self-pay | Admitting: Cardiovascular Disease

## 2023-01-17 NOTE — Telephone Encounter (Signed)
Pt c/o medication issue:  1. Name of Medication: Bempedoic Acid-Ezetimibe (NEXLIZET) 180-10 MG TABS   2. How are you currently taking this medication (dosage and times per day)? As prescribed   3. Are you having a reaction (difficulty breathing--STAT)? No  4. What is your medication issue? Patient states that she is now being charged more for this medication and she would like a call back to discuss this.

## 2023-01-17 NOTE — Telephone Encounter (Signed)
Spoke with pt and pt  picked up Nexlizet and was charged $40.00 per bottle and pt had not paid prior to this Pt is going to call insurance and find out why this was changed and if need to try and get assistance or if need to changes med to  something cheaper  Will call back ./cy

## 2023-01-18 ENCOUNTER — Encounter: Payer: Self-pay | Admitting: Pharmacist

## 2023-01-18 NOTE — Telephone Encounter (Signed)
She was previously enrolled in the [employer] which covered the rest of her Nexlizet copay. $40 is the standard copay when the prescription is processed through her insurance. I have re-enrolled her in the [place] through 12/19/23. Called pt and advised her of this, I have sent updated grant info to her via MyChart message. She is aware to show it to the pharmacy so they can reprocess her current copay to make the med free again.

## 2023-01-24 ENCOUNTER — Encounter: Payer: Self-pay | Admitting: Cardiovascular Disease

## 2023-01-25 NOTE — Telephone Encounter (Signed)
Called the patient's niece, who called and talked w the patient while I was on the line.  Patient will come Friday AM to see Dr. Angelena Form.

## 2023-01-27 NOTE — Progress Notes (Signed)
4  Chief Complaint  Patient presents with   Follow-up    DVT   History of Present Illness: 89 yo female with history of CAD, HLD, HTN, non-Hodgkin's lymphoma, DM and mitral regurgitation here today for cardiac follow up. In 2009 she had tandem non-overlapping drug-eluting stents placed in the LAD. Last cardiac cath April 2015 with patent LAD stents, occluded apical LAD segment with left to left collaterals and non-obstructive disease elsewhere. She has been intolerant to statins and Plavix in the past. She has been on Zetia. Echo 01/20/16 with normal LV size and function, trivial aortic valve insufficiency. She has had bilateral foot pain and normal ABI in 2016. Last nuclear stress test October 2017 with no ischemia. She has stable angina controlled with Imdur.  Echo May 2021 with LVEF=60-65%. Mild AI. Echo May 2023 with LVEF=60-65%, mild LVH. Mild AI. She was seen in our office 12/17/22 and was doing well but described swelling in her right leg. Venous dopplers with right LE DVT. She was started on Eliquis. She was seen in our office 12/24/22 and reported dark black stools. She was sent to the ED and her Hgb was down to 10 from 11 on 12/20/22. Positive hemoccult stool card. Her Eliquis was lowered to 5 mg po BID. She was discharged home. She was referred to GI by her primary care provider. Hgb 9.8 on 12/29/22. She contacted our office on 01/24/23 and reported ongoing right leg pain.   She is here today for follow up. She reports that her stools are now brown. No red blood on her toilet tissue. She feels well overall. Her right knee hurts and has some swelling but she only has minimal swelling of her right ankle at times during the day. Her knee pain is chronic. She has no chest pain, dyspnea, palpitations, lower extremity edema, orthopnea, PND, dizziness, near syncope or syncope.   She has an upcoming appt with GI. She is asking about length of anti-coagulation therapy for her DVT.   Primary Care Physician:  Pleas Koch, NP  Past Medical History:  Diagnosis Date   Acute bilateral thoracic back pain 08/16/2019   Allergy    Cipro, Plavix, Statins   Anemia    Arthritis    CAD (coronary artery disease)    a. s/p tandem Promus DES to LAD in 2009 by Dr. Olevia Perches;  b.  LHC (03/22/14):  prox LAD 30%, mid LAD 40%, LAD stent ok with dist 20% ISR, apical LAD occluded with L-L collats filling apical vessel (too small for PCI), mid RCA 20%, EF 70%.  Med Rx   Cancer [facility])    Chronic kidney disease, stage 3b ([place])    Colon polyps    Diabetes mellitus    Diagnosed 2012   Diverticulosis of colon (without mention of hemorrhage)    DVT (deep venous thrombosis) (HCC)    GERD (gastroesophageal reflux disease)    Glaucoma    Herpes zoster without complication [id number]   Hyperlipidemia    intol of statins   Hypertension    Kidney stones    NHL (non-Hodgkin's lymphoma) ([place]) dx'd 2003   chemo/xrt comp 2003   Personal history of colonic polyps    Proctitis    Pruritic intertrigo 07/18/2014   Urticaria     Past Surgical History:  Procedure Laterality Date   cardiac cath-neg     CORONARY ANGIOPLASTY WITH STENT PLACEMENT     x 2   dexa-neg     KNEE SURGERY  10/2003   left   laser surgery for cataracts-left     LEFT HEART CATHETERIZATION WITH CORONARY ANGIOGRAM N/A 03/22/2014   Procedure: LEFT HEART CATHETERIZATION WITH CORONARY ANGIOGRAM;  Surgeon: Burnell Blanks, MD;  Location: [facility] CATH LAB;  Service: Cardiovascular;  Laterality: N/A;   stress cardiolite     VAGINAL HYSTERECTOMY     partial, fibroids, one ovary left    Current Outpatient Medications  Medication Sig Dispense Refill   apixaban (ELIQUIS) 5 MG TABS tablet Take 2 tablets (10 mg total) by mouth 2 (two) times daily. For 7 days ( 1 week ) ONLY.  THEN: Take 1 tablet ( 5 mg ) by mouth 2 ( two ) times daily. 60 tablet 5   Bempedoic Acid-Ezetimibe (NEXLIZET) 180-10 MG TABS TAKE 1 TABLET BY MOUTH DAILY 90 tablet 3   Blood  Glucose Monitoring Suppl DEVI Use up to three times daily to check blood sugar. May substitute to any manufacturer covered by patient's insurance. 1 each 0   brimonidine-timolol (COMBIGAN) 0.2-0.5 % ophthalmic solution Place 1 drop into both eyes every 12 (twelve) hours.     Cholecalciferol (VITAMIN D-3) 1000 UNITS CAPS Take 1 capsule by mouth daily.     diclofenac Sodium (VOLTAREN ARTHRITIS PAIN) 1 % GEL Apply 2 g topically 3 (three) times daily as needed. 100 g 0   dorzolamide-timolol (COSOPT) 2-0.5 % ophthalmic solution SMARTSIG:In Eye(s)     fexofenadine (ALLEGRA) 180 MG tablet Take 1 tablet (180 mg total) by mouth as needed for allergies or rhinitis. 90 tablet 0   gabapentin (NEURONTIN) 100 MG capsule Take 1 capsule (100 mg total) by mouth 3 (three) times daily. 180 capsule 1   glipiZIDE (GLUCOTROL XL) 10 MG 24 hr tablet TAKE ONE TABLET BY MOUTH EVERY MORNING WITH BREAKFAST FOR DIABETES 90 tablet 1   isosorbide mononitrate (IMDUR) 60 MG 24 hr tablet Take 1.5 tablets (90 mg total) by mouth daily. 135 tablet 1   metoprolol succinate (TOPROL-XL) 100 MG 24 hr tablet TAKE ONE TABLET BY MOUTH EVERY MORNING WITH OR IMMEDIATELY FOLLOWING A MEAL 90 tablet 2   nitroGLYCERIN (NITROSTAT) 0.4 MG SL tablet Place 1 tablet (0.4 mg total) under the tongue every 5 (five) minutes as needed for chest pain. 25 tablet 3   pantoprazole (PROTONIX) 20 MG tablet TAKE 1 TABLET BY MOUTH DAILY FOR HEARTBURN 90 tablet 2   sertraline (ZOLOFT) 100 MG tablet TAKE 1 TABLET BY MOUTH DAILY FOR ANXIETY AND DEPRESSION 90 tablet 2   timolol (TIMOPTIC) 0.5 % ophthalmic solution      vitamin B-12 (CYANOCOBALAMIN) 1000 MCG tablet Take 1,000 mcg by mouth daily.     ferrous sulfate 325 (65 FE) MG tablet Take 1 tablet (325 mg total) by mouth daily with breakfast. (Patient not taking: Reported on 01/28/2023) 30 tablet 2   VYZULTA 0.024 % SOLN Apply 1 drop to eye at bedtime. (Patient not taking: Reported on 01/28/2023)     No current  facility-administered medications for this visit.    Allergies  Allergen Reactions   Other Rash and Anaphylaxis   Shellfish Allergy Anaphylaxis and Rash   Cephalexin     REACTION: trash and swelling REACTION: trash and swelling   Ciprofloxacin     REACTION: u/k   Clopidogrel Bisulfate     REACTION: swelling, rash   Codeine Phosphate     REACTION: unspecified   Ezetimibe-Simvastatin     REACTION: myalgias REACTION: myalgias   Hydrocod Poli-Chlorphe Poli Er  REACTION: u/k   Lidocaine Hives    ONLY TO ADHESIVE LIDOCAINE PATCHES. NO PROBLEM WITH INJECTABLE LIDOCAINE.   Nitrofurantoin     REACTION: [place] REACTION: [place]   Pregabalin     REACTION: felt bad REACTION: felt bad   Propoxyphene N-Acetaminophen     REACTION: [place]   Rofecoxib     unk unk    Sulfamethoxazole     REACTION: unspecified   Sulfonamide Derivatives     REACTION: unspecified   Celecoxib Rash    REACTION: [place] REACTION: [place]   Codeine Rash    REACTION: [place] REACTION: [place]   Hydrocod Poli-Chlorphe Poli Er Rash   Propoxyphene Rash   Sulfa Antibiotics Rash    REACTION: unspecified REACTION: unspecified    Social History   Socioeconomic History   Marital status: Widowed    Spouse name: Not on file   Number of children: 0   Years of education: Not on file   Highest education level: Not on file  Occupational History   Occupation: Retired    Fish farm manager: RETIRED   Occupation: retired  Tobacco Use   Smoking status: Never   Smokeless tobacco: Never  Scientific laboratory technician Use: Never used  Substance and Sexual Activity   Alcohol use: No   Drug use: No   Sexual activity: Not Currently  Other Topics Concern   Not on file  Social History Narrative   Not on file   Social Determinants of Health   Financial Resource Strain: Low Risk  (06/25/2022)   Overall Financial Resource Strain (CARDIA)    Difficulty of Paying Living Expenses: Not hard at all  Food Insecurity: No Food Insecurity (06/25/2022)   Hunger  Vital Sign    Worried About Running Out of Food in the Last Year: Never true    [place] in the Last Year: Never true  Transportation Needs: No Transportation Needs (06/25/2022)   PRAPARE - Hydrologist (Medical): No    Lack of Transportation (Non-Medical): No  Physical Activity: Inactive (06/25/2022)   Exercise Vital Sign    Days of Exercise per Week: 0 days    Minutes of Exercise per Session: 0 min  Stress: No Stress Concern Present (06/25/2022)   [place]    Feeling of Stress : Not at all  Social Connections: Not on file  Intimate Partner Violence: Not At Risk (06/23/2021)   Humiliation, Afraid, Rape, and Kick questionnaire    Fear of Current or Ex-Partner: No    Emotionally Abused: No    Physically Abused: No    Sexually Abused: No    Family History  Problem Relation Age of Onset   Cancer Mother        jaw   Hypertension Father    Heart attack Father    Heart attack Sister    Colon cancer Neg Hx     Review of Systems:  As stated in the HPI and otherwise negative.   BP (!) 150/70   Pulse 67   Ht '5\' 9"'$  (1.753 m)   Wt 73 kg   SpO2 98%   BMI 23.78 kg/m   Physical Examination: General: Well developed, well nourished, NAD  HEENT: OP clear, mucus membranes moist  SKIN: warm, dry. No rashes. Neuro: No focal deficits  Musculoskeletal: Muscle strength 5/5 all ext  Psychiatric: Mood and affect normal  Neck: No JVD, no carotid bruits, no thyromegaly, no  lymphadenopathy.  Lungs:Clear bilaterally, no wheezes, rhonci, crackles Cardiovascular: Regular rate and rhythm. Soft systolic murmur.  Abdomen:Soft. Bowel sounds present. Non-tender.  Extremities: No lower extremity edema. Pulses are 2 + in the bilateral DP/PT.  Echo May 2023:   1. Left ventricular ejection fraction, by estimation, is 60 to 65%. The  left ventricle has normal function. The left ventricle has no regional   wall motion abnormalities. There is mild concentric left ventricular  hypertrophy of the with severe basal  septal thickness. Left ventricular diastolic parameters are consistent  with Grade I diastolic dysfunction (impaired relaxation).   2. Right ventricular systolic function is normal. The right ventricular  size is normal.   3. Left atrial size was moderately dilated.   4. The pericardial effusion is posterior to the left ventricle. There is  no evidence of cardiac tamponade.   5. The mitral valve is normal in structure. No evidence of mitral valve  regurgitation. No evidence of mitral stenosis.   6. The aortic valve is tricuspid. Aortic valve regurgitation is mild.  Aortic valve sclerosis is present, with no evidence of aortic valve  stenosis.   7. The inferior vena cava is normal in size with greater than 50%  respiratory variability, suggesting right atrial pressure of 3 mmHg.   Cardiac cath 03/22/14: Left main: No obstructive disease.   Left Anterior Descending Artery: Large caliber vessel that courses to the apex. 30% proximal stenosis. 40% mid stenosis just prior to the stented segment. Mid stented segment patent without restenosis. Distal stented segment patent with 20% restenosis. Apical LAD becomes very small in caliber and has total occlusion with filling of apical vessel by left to left collaterals. (0.75 mm vessel).   Circumflex Artery: Large caliber vessel with several small OM branches. Diffuse luminal irregularities in the mid vessel.   Right Coronary Artery: Large dominant vessel with 20% mid stenosis.  Left Ventricular Angiogram: LVEF=70%.   EKG:  EKG is not ordered today. The ekg ordered today demonstrates   Recent Labs: 12/24/2022: ALT 17; BUN 22; Creatinine, Ser 1.29; Potassium 4.5; Sodium 139 12/29/2022: Hemoglobin 9.8; Platelets 242.0   Lipid Panel    Component Value Date/Time   CHOL 123 02/01/2022 0853   TRIG 226 (H) 02/01/2022 0853   HDL 33 (L)  02/01/2022 0853   CHOLHDL 3.7 02/01/2022 0853   CHOLHDL 4 07/02/2021 1121   VLDL 34.6 07/02/2021 1121   LDLCALC 54 02/01/2022 0853   LDLDIRECT 93.0 08/06/2015 0847     Wt Readings from Last 3 Encounters:  01/28/23 73 kg  01/05/23 72.1 kg  12/29/22 72.1 kg    Assessment and Plan:   1. CAD with stable angina: No change in chronic chest pains. Continue Imdur, Toprol and Zetia. She is statin intolerant. ASA stopped when she was started on Eliquis.   2. Hyperlipidemia: LDL 54 in 2023. Continue Zetia. She is statin intolerant.   3. HTN: BP is elevated today but she just took her BP meds before her office visit. Her BP has been controlled.   4. Aortic insufficiency: Mild by echo May 2023.    5. Right LE DVT: Her right leg swelling has improved. She has minimal right ankle edema at times. She is now wearing compression stockings. Her DVT management and discussion around length of Eliquis therapy will be in primary care. I told her to continue Eliquis for now. She does not seem to have active GI bleeding. Continue Eliquis 5 mg po BID She will follow up  as planned with her primary care provider for management of her DVT  6. GI bleeding/anemia: GI workup pending. No active bleeding at this time.   Labs/ tests ordered today include:   No orders of the defined types were placed in this encounter.  Disposition:   F/U with me in 12 months  Signed, Lauree Chandler, MD 01/28/2023 10:40 AM    [facility] [address], [place], [place]  [address] Phone: [phone]; Fax: [phone]

## 2023-01-28 ENCOUNTER — Ambulatory Visit: Payer: Medicare PPO | Attending: Cardiovascular Disease | Admitting: Cardiovascular Disease

## 2023-01-28 ENCOUNTER — Encounter: Payer: Self-pay | Admitting: Cardiovascular Disease

## 2023-01-28 VITALS — BP 150/70 | HR 67 | Ht 69.0 in | Wt 161.0 lb

## 2023-01-28 DIAGNOSIS — I10 Essential (primary) hypertension: Secondary | ICD-10-CM | POA: Diagnosis not present

## 2023-01-28 DIAGNOSIS — I25118 Atherosclerotic heart disease of native coronary artery with other forms of angina pectoris: Secondary | ICD-10-CM | POA: Diagnosis not present

## 2023-01-28 DIAGNOSIS — E785 Hyperlipidemia, unspecified: Secondary | ICD-10-CM

## 2023-01-28 DIAGNOSIS — I351 Nonrheumatic aortic (valve) insufficiency: Secondary | ICD-10-CM | POA: Diagnosis not present

## 2023-01-28 DIAGNOSIS — I82491 Acute embolism and thrombosis of other specified deep vein of right lower extremity: Secondary | ICD-10-CM

## 2023-01-28 NOTE — Patient Instructions (Signed)
Medication Instructions:  No changes *If you need a refill on your cardiac medications before your next appointment, please call your pharmacy*   Lab Work: none If you have labs (blood work) drawn today and your tests are completely normal, you will receive your results only by: [place] (if you have MyChart) OR A paper copy in the mail If you have any lab test that is abnormal or we need to change your treatment, we will call you to review the results.   Testing/Procedures: none   Follow-Up: At [facility], you and your health needs are our priority.  As part of our continuing mission to provide you with exceptional heart care, we have created designated Provider Care Teams.  These Care Teams include your primary Cardiologist (physician) and Advanced Practice Providers (APPs -  Physician Assistants and Nurse Practitioners) who all work together to provide you with the care you need, when you need it.   Your next appointment:   12 month(s)  Provider:   Lauree Chandler, MD

## 2023-01-31 DIAGNOSIS — H401133 Primary open-angle glaucoma, bilateral, severe stage: Secondary | ICD-10-CM | POA: Diagnosis not present

## 2023-02-04 ENCOUNTER — Other Ambulatory Visit: Payer: Self-pay | Admitting: Primary Care

## 2023-02-04 DIAGNOSIS — G8929 Other chronic pain: Secondary | ICD-10-CM

## 2023-02-07 ENCOUNTER — Ambulatory Visit: Payer: Medicare PPO | Admitting: Cardiovascular Disease

## 2023-02-07 DIAGNOSIS — H401133 Primary open-angle glaucoma, bilateral, severe stage: Secondary | ICD-10-CM | POA: Diagnosis not present

## 2023-02-07 DIAGNOSIS — Z0100 Encounter for examination of eyes and vision without abnormal findings: Secondary | ICD-10-CM | POA: Diagnosis not present

## 2023-02-07 DIAGNOSIS — Z961 Presence of intraocular lens: Secondary | ICD-10-CM | POA: Diagnosis not present

## 2023-02-09 ENCOUNTER — Ambulatory Visit: Payer: Medicare PPO | Admitting: Nurse Practitioner

## 2023-02-15 ENCOUNTER — Ambulatory Visit: Payer: Medicare PPO | Admitting: Podiatry

## 2023-02-15 DIAGNOSIS — B351 Tinea unguium: Secondary | ICD-10-CM | POA: Diagnosis not present

## 2023-02-15 DIAGNOSIS — M7751 Other enthesopathy of right foot: Secondary | ICD-10-CM | POA: Diagnosis not present

## 2023-02-15 DIAGNOSIS — M79674 Pain in right toe(s): Secondary | ICD-10-CM

## 2023-02-15 DIAGNOSIS — M79675 Pain in left toe(s): Secondary | ICD-10-CM | POA: Diagnosis not present

## 2023-02-15 MED ORDER — BETAMETHASONE SOD PHOS & ACET 6 (3-3) MG/ML IJ SUSP
3.0000 mg | Freq: Once | INTRAMUSCULAR | Status: AC
  Administered 2023-02-15: 3 mg via INTRA_ARTICULAR

## 2023-02-15 NOTE — Progress Notes (Signed)
   No chief complaint on file.   SUBJECTIVE Patient with a history of diabetes mellitus presents to office today complaining of elongated, thickened nails that cause pain while ambulating in shoes.  Patient is unable to trim their own nails.  Patient states that injection she received last time to the second toe joint helped significantly for several months.  She is experiencing pain to both of the second toes bilaterally today.  She is requesting injection today.   Past Medical History:  Diagnosis Date   Acute bilateral thoracic back pain 08/16/2019   Allergy    Cipro, Plavix, Statins   Anemia    Arthritis    CAD (coronary artery disease)    a. s/p tandem Promus DES to LAD in 2009 by Dr. Olevia Perches;  b.  LHC (03/22/14):  prox LAD 30%, mid LAD 40%, LAD stent ok with dist 20% ISR, apical LAD occluded with L-L collats filling apical vessel (too small for PCI), mid RCA 20%, EF 70%.  Med Rx   Cancer [facility])    Chronic kidney disease, stage 3b ([place])    Colon polyps    Diabetes mellitus    Diagnosed 2012   Diverticulosis of colon (without mention of hemorrhage)    DVT (deep venous thrombosis) (HCC)    GERD (gastroesophageal reflux disease)    Glaucoma    Herpes zoster without complication [id number]   Hyperlipidemia    intol of statins   Hypertension    Kidney stones    NHL (non-Hodgkin's lymphoma) ([place]) dx'd 2003   chemo/xrt comp 2003   Personal history of colonic polyps    Proctitis    Pruritic intertrigo 07/18/2014   Urticaria     OBJECTIVE General Patient is awake, alert, and oriented x 3 and in no acute distress. Derm Skin is dry and supple bilateral. Negative open lesions or macerations. Remaining integument unremarkable. Nails are tender, long, thickened and dystrophic with subungual debris, consistent with onychomycosis, 1-5 bilateral. No signs of infection noted. Vasc  DP and PT pedal pulses palpable bilaterally. Temperature gradient within normal limits.  Neuro Epicritic and  protective threshold sensation diminished bilaterally.  Musculoskeletal Exam No symptomatic pedal deformities noted bilateral. Muscular strength within normal limits.  There is some tenderness with palpation of the first MTP as well as some pain with range of motion as well.  ASSESSMENT 1. Diabetes Mellitus w/ peripheral neuropathy 2.  Pain due to onychomycosis of toenails bilateral 3.  First MTP capsulitis right  PLAN OF CARE 1. Patient evaluated today. 2. Instructed to maintain good pedal hygiene and foot care. Stressed importance of controlling blood sugar.  Continue wearing good supportive shoes and sneakers. 3. Mechanical debridement of nails 1-5 bilaterally performed using a nail nipper. Filed with dremel without incident.  4.  Injection of 0.5 cc Celestone Soluspan injected into the first MTP right foot 5.  Return to clinic in 3 mos.     Edrick Kins, DPM [facility]  Dr. Edrick Kins, DPM    [address], [place] [address]                Office [phone]  Fax [phone]

## 2023-02-16 ENCOUNTER — Ambulatory Visit: Payer: Medicare PPO | Admitting: Primary Care

## 2023-02-16 VITALS — BP 132/74 | HR 74 | Temp 98.6°F | Ht 69.0 in | Wt 163.0 lb

## 2023-02-16 DIAGNOSIS — E1122 Type 2 diabetes mellitus with diabetic chronic kidney disease: Secondary | ICD-10-CM

## 2023-02-16 DIAGNOSIS — N1832 Chronic kidney disease, stage 3b: Secondary | ICD-10-CM

## 2023-02-16 LAB — POCT GLYCOSYLATED HEMOGLOBIN (HGB A1C): Hemoglobin A1C: 6.4 % — AB (ref 4.0–5.6)

## 2023-02-16 MED ORDER — LANCETS MISC. MISC: 1.0000 | Freq: Three times a day (TID) | 5 refills | Status: AC

## 2023-02-16 MED ORDER — LANCET DEVICE MISC: 1.0000 | Freq: Three times a day (TID) | 0 refills | Status: AC

## 2023-02-16 MED ORDER — BLOOD GLUCOSE MONITORING SUPPL DEVI: 1.0000 | Freq: Three times a day (TID) | 0 refills | Status: AC

## 2023-02-16 MED ORDER — BLOOD GLUCOSE TEST VI STRP: 1.0000 | ORAL_STRIP | Freq: Three times a day (TID) | 5 refills | Status: AC

## 2023-02-16 NOTE — Patient Instructions (Signed)
Great job with your A1C.   Continue to monitor diet and exercise.   Please let us know if you have any issues with pharmacy and the glucometer.   Set up physical in late September.  It was a pleasure to see you today!

## 2023-02-16 NOTE — Assessment & Plan Note (Addendum)
Controlled and improved with A1C of 6.4 today.   Discussed the importance of exercise and monitoring diet.   New glucometer kit sent to pharmacy.  Continue Glipizide XL 10 mg daily.  Intolerant to statin therapy.  Foot exam deferred today. Pneumonia vaccine UTD  Follow up in 6 months.  I evaluated patient, was consulted regarding treatment, and agree with assessment and plan per Tinnie Gens, RN, DNP student.   Allie Bossier, NP-C

## 2023-02-16 NOTE — Progress Notes (Signed)
Established Patient Office Visit  Subjective   Patient ID: [name] [name], female    DOB: Nov 20, 1932  Age: 89 y.o. MRN: [id number]  Chief Complaint  Patient presents with   Medical Management of Chronic Issues    6 mo dm f/u    HPI  Fantasy Compitello is a 89 year old female with past medical history of HTN, CAD, DVT, GERD, IBS, Type 2 diabetes mellitus, HLD presents today for a diabetes follow up.   Current medications include: Glipizide XL 10 mg once daily,   She is not checking her blood sugar at home.   Last A1C: 7.5 on 08/18/22 Last Eye Exam: UTD  Last Foot Exam: Due  Pneumonia Vaccination: UTD Urine Microalbumin: UTD Statin: Intolerant to statin therapy.   Dietary changes since last visit: eating salads, grilled chicken, home cooked meals. Does eat a piece of candy often. She does enjoy ice creams.    Exercise: physically active but no regular exercise.    Patient Active Problem List   Diagnosis Date Noted   DVT (deep venous thrombosis) (HCC) 12/29/2022   Dark stools 12/29/2022   Acute cough 11/26/2022   Post-nasal drip 09/29/2022   Chronic pain of both knees 08/27/2022   Urge incontinence of urine 02/05/2022   Hav (hallux abducto valgus), unspecified laterality 01/25/2022   Nonrheumatic aortic valve insufficiency 10/05/2021   Left lower quadrant abdominal pain 01/21/2021   Skin mass 09/11/2020   Bilateral lower extremity edema 03/26/2020   Preventative health care 06/05/2019   Shoulder pain 03/13/2019   Tingling 06/29/2018   Recurrent UTI 01/12/2018   Neck pain 09/27/2017   Insomnia 09/27/2017   Anxiety and depression 05/04/2016   Hives 05/04/2016   Pain in joints of both feet 01/14/2016   Osteopenia 08/06/2015   Constipation 12/30/2014   B12 deficiency 12/30/2014   Allergic reaction 09/02/2014   Right hip pain 06/04/2014   Chronic fatigue 06/04/2014   Elevated LFTs 04/19/2014   Mitral regurgitation 04/19/2014   Pes planus 06/13/2013   Renal  insufficiency 06/07/2011   Type 2 diabetes mellitus with chronic kidney disease ([place]) 05/31/2011   MIXED HYPERLIPIDEMIA 08/04/2010   LUMBAR SPRAIN AND STRAIN 01/15/2010   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 s/p tandem Promus DES to LAD in 2009 by Dr. Olevia Perches;  b.  LHC (03/22/14):  prox LAD 30%, mid LAD 40%, LAD stent ok with dist 20% ISR, apical LAD occluded with L-L collats filling apical vessel (too small for PCI), mid RCA 20%, EF 70%.  Med Rx   Cancer [facility])    Chronic kidney disease, stage 3b ([place])    Colon polyps    Diabetes mellitus    Diagnosed 2012   Diverticulosis of colon (without mention of hemorrhage)    DVT (deep venous thrombosis) (HCC)    GERD (gastroesophageal reflux disease)    Glaucoma  Herpes zoster without complication [id number]   Hyperlipidemia    intol of statins   Hypertension    Kidney stones    NHL (non-Hodgkin's lymphoma) ([place]) dx'd 2003   chemo/xrt comp 2003   Personal history of colonic polyps    Proctitis    Pruritic intertrigo 07/18/2014   Urticaria    Past Surgical History:  Procedure Laterality Date   cardiac cath-neg     CORONARY ANGIOPLASTY WITH STENT PLACEMENT     x 2   dexa-neg     KNEE SURGERY  10/2003   left   laser surgery for cataracts-left     LEFT HEART CATHETERIZATION WITH CORONARY ANGIOGRAM N/A 03/22/2014   Procedure: LEFT HEART  CATHETERIZATION WITH CORONARY ANGIOGRAM;  Surgeon: Burnell Blanks, MD;  Location: [facility] CATH LAB;  Service: Cardiovascular;  Laterality: N/A;   stress cardiolite     VAGINAL HYSTERECTOMY     partial, fibroids, one ovary left   Social History   Tobacco Use   Smoking status: Never   Smokeless tobacco: Never  Vaping Use   Vaping Use: Never used  Substance Use Topics   Alcohol use: No   Drug use: No   Family History  Problem Relation Age of Onset   Cancer Mother        jaw   Hypertension Father    Heart attack Father    Heart attack Sister    Colon cancer Neg Hx    Allergies  Allergen Reactions   Other Rash and Anaphylaxis   Shellfish Allergy Anaphylaxis and Rash   Cephalexin     REACTION: trash and swelling REACTION: trash and swelling   Ciprofloxacin     REACTION: u/k   Clopidogrel Bisulfate     REACTION: swelling, rash   Codeine Phosphate     REACTION: unspecified   Ezetimibe-Simvastatin     REACTION: myalgias REACTION: myalgias   Hydrocod Poli-Chlorphe Poli Er     REACTION: u/k   Lidocaine Hives    ONLY TO ADHESIVE LIDOCAINE PATCHES. NO PROBLEM WITH INJECTABLE LIDOCAINE.   Nitrofurantoin     REACTION: [place] REACTION: [place]   Pregabalin     REACTION: felt bad REACTION: felt bad   Propoxyphene N-Acetaminophen     REACTION: [place]   Rofecoxib     unk unk    Sulfamethoxazole     REACTION: unspecified   Sulfonamide Derivatives     REACTION: unspecified   Celecoxib Rash    REACTION: [place] REACTION: [place]   Codeine Rash    REACTION: [place] REACTION: [place]   Hydrocod Poli-Chlorphe Poli Er Rash   Propoxyphene Rash   Sulfa Antibiotics Rash    REACTION: unspecified REACTION: unspecified      Review of Systems  Eyes:  Negative for blurred vision.  Respiratory:  Negative for shortness of breath.   Cardiovascular:  Negative for chest pain.  Gastrointestinal:  Negative for abdominal pain and diarrhea.  Neurological:  Positive for tingling. Negative for dizziness and  headaches.      Objective:     BP 132/74   Pulse 74   Temp 98.6 F (37 C) (Temporal)   Ht 5\' 9"  (1.753 m)   Wt 163 lb (73.9 kg)   SpO2 98%   BMI 24.07 kg/m  BP Readings from Last 3 Encounters:  02/16/23 132/74  01/28/23 (!) 150/70  01/05/23 128/62   Wt Readings from Last 3 Encounters:  02/16/23 163 lb (73.9 kg)  01/28/23 161 lb (73 kg)  01/05/23  159 lb (72.1 kg)      Physical Exam Vitals and nursing note reviewed.  Constitutional:      Appearance: Normal appearance.  Cardiovascular:     Rate and Rhythm: Normal rate and regular rhythm.     Pulses: Normal pulses.     Heart sounds: Normal heart sounds.  Pulmonary:     Effort: Pulmonary effort is normal.     Breath sounds: Normal breath sounds.  Neurological:     Mental Status: She is alert and oriented to person, place, and time.  Psychiatric:        Mood and Affect: Mood normal.        Behavior: Behavior normal.      Results for orders placed or performed in visit on 02/16/23  POCT glycosylated hemoglobin (Hb A1C)  Result Value Ref Range   Hemoglobin A1C 6.4 (A) 4.0 - 5.6 %   HbA1c POC (<> result, manual entry)     HbA1c, POC (prediabetic range)     HbA1c, POC (controlled diabetic range)         The ASCVD Risk score (Arnett DK, et al., 2019) failed to calculate for the following reasons:   The 2019 ASCVD risk score is only valid for ages 17 to 51    Assessment & Plan:   Problem List Items Addressed This Visit       Endocrine   Type 2 diabetes mellitus with chronic kidney disease ([place]) - Primary    Controlled and improved with A1C of 6.4 today.   Discussed the importance of exercise and monitoring diet.   New glucometer kit sent to pharmacy.  Continue Glipizide XL 10 mg daily.  Intolerant to statin therapy.  Foot exam deferred today. Pneumonia vaccine UTD  Follow up in 6 months.  I evaluated patient, was consulted regarding treatment, and agree with assessment and plan per Tinnie Gens,  RN, DNP student.   Allie Bossier, NP-C       Relevant Medications   Blood Glucose Monitoring Suppl DEVI   Glucose Blood (BLOOD GLUCOSE TEST STRIPS) STRP   Lancet Device MISC   Lancets Misc. MISC   Other Relevant Orders   POCT glycosylated hemoglobin (Hb A1C) (Completed)    Return in about 6 months (around 08/19/2023) for physical.    Tinnie Gens, BSN-RN, DNP STUDENT

## 2023-02-16 NOTE — Progress Notes (Signed)
Subjective:    Patient ID: [name] [name], female    DOB: 09-05-1932, 89 y.o.   MRN: [id number]  HPI  [name] [name] is a very pleasant 89 y.o. female with a history of type 2 diabetes, CAD, osteopenia, renal insufficiency, recurrent UTI, chronic hip pain, chronic fatigue, chronic knee pain who presents today for follow up of diabetes.  Current medications include: Glipizide XL 10 mg daily. She is needing a new glucometer as her Accucheck lancet pen is deffective.   Last A1C: 7.5 in September 2023, 6.4 today Last Eye Exam: UTD Last Foot Exam: Due, declines today Pneumonia Vaccination: UTD Urine Microalbumin: UTD Statin: statin intolerant   Dietary changes since last visit: Generally watches her diet.     Exercise:  Active   BP Readings from Last 3 Encounters:  02/16/23 132/74  01/28/23 (!) 150/70  01/05/23 128/62       Review of Systems  Cardiovascular:  Negative for chest pain.  Endocrine: Negative for polydipsia, polyphagia and polyuria.  Neurological:  Positive for numbness.         Past Medical History:  Diagnosis Date   Acute bilateral thoracic back pain 08/16/2019   Allergy    Cipro, Plavix, Statins   Anemia    Arthritis    CAD (coronary artery disease)    a. s/p tandem Promus DES to LAD in 2009 by Dr. Olevia Perches;  b.  LHC (03/22/14):  prox LAD 30%, mid LAD 40%, LAD stent ok with dist 20% ISR, apical LAD occluded with L-L collats filling apical vessel (too small for PCI), mid RCA 20%, EF 70%.  Med Rx   Cancer [facility])    Chronic kidney disease, stage 3b ([place])    Colon polyps    Diabetes mellitus    Diagnosed 2012   Diverticulosis of colon (without mention of hemorrhage)    DVT (deep venous thrombosis) (HCC)    GERD (gastroesophageal reflux disease)    Glaucoma    Herpes zoster without complication [id number]   Hyperlipidemia    intol of statins   Hypertension    Kidney stones    NHL (non-Hodgkin's lymphoma) ([place]) dx'd 2003   chemo/xrt comp 2003   Personal  history of colonic polyps    Proctitis    Pruritic intertrigo 07/18/2014   Urticaria     Social History   Socioeconomic History   Marital status: Widowed    Spouse name: Not on file   Number of children: 0   Years of education: Not on file   Highest education level: 12th grade  Occupational History   Occupation: Retired    Fish farm manager: RETIRED   Occupation: retired  Tobacco Use   Smoking status: Never   Smokeless tobacco: Never  Scientific laboratory technician Use: Never used  Substance and Sexual Activity   Alcohol use: No   Drug use: No   Sexual activity: Not Currently  Other Topics Concern   Not on file  Social History Narrative   Not on file   Social Determinants of Health   Financial Resource Strain: Low Risk  (02/16/2023)   Overall Financial Resource Strain (CARDIA)    Difficulty of Paying Living Expenses: Not hard at all  Food Insecurity: No Food Insecurity (02/16/2023)   Hunger Vital Sign    Worried About Running Out of Food in the Last Year: Never true    [place] in the Last Year: Never true  Transportation Needs: No Transportation Needs (02/16/2023)  PRAPARE - Hydrologist (Medical): No    Lack of Transportation (Non-Medical): No  Physical Activity: Inactive (06/25/2022)   Exercise Vital Sign    Days of Exercise per Week: 0 days    Minutes of Exercise per Session: 0 min  Stress: No Stress Concern Present (06/25/2022)   [place]    Feeling of Stress : Not at all  Social Connections: Moderately Integrated (02/16/2023)   Social Connection and Isolation Panel [NHANES]    Frequency of Communication with Friends and Family: More than three times a week    Frequency of Social Gatherings with Friends and Family: Once a week    Attends Religious Services: More than 4 times per year    Active Member of [employer] or Organizations: Yes    Attends Archivist Meetings: More  than 4 times per year    Marital Status: Widowed  Intimate Partner Violence: Not At Risk (06/23/2021)   Humiliation, Afraid, Rape, and Kick questionnaire    Fear of Current or Ex-Partner: No    Emotionally Abused: No    Physically Abused: No    Sexually Abused: No    Past Surgical History:  Procedure Laterality Date   cardiac cath-neg     CORONARY ANGIOPLASTY WITH STENT PLACEMENT     x 2   dexa-neg     KNEE SURGERY  10/2003   left   laser surgery for cataracts-left     LEFT HEART CATHETERIZATION WITH CORONARY ANGIOGRAM N/A 03/22/2014   Procedure: LEFT HEART CATHETERIZATION WITH CORONARY ANGIOGRAM;  Surgeon: Burnell Blanks, MD;  Location: [facility] CATH LAB;  Service: Cardiovascular;  Laterality: N/A;   stress cardiolite     VAGINAL HYSTERECTOMY     partial, fibroids, one ovary left    Family History  Problem Relation Age of Onset   Cancer Mother        jaw   Hypertension Father    Heart attack Father    Heart attack Sister    Colon cancer Neg Hx     Allergies  Allergen Reactions   Other Rash and Anaphylaxis   Shellfish Allergy Anaphylaxis and Rash   Cephalexin     REACTION: trash and swelling REACTION: trash and swelling   Ciprofloxacin     REACTION: u/k   Clopidogrel Bisulfate     REACTION: swelling, rash   Codeine Phosphate     REACTION: unspecified   Ezetimibe-Simvastatin     REACTION: myalgias REACTION: myalgias   Hydrocod Poli-Chlorphe Poli Er     REACTION: u/k   Lidocaine Hives    ONLY TO ADHESIVE LIDOCAINE PATCHES. NO PROBLEM WITH INJECTABLE LIDOCAINE.   Nitrofurantoin     REACTION: [place] REACTION: [place]   Pregabalin     REACTION: felt bad REACTION: felt bad   Propoxyphene N-Acetaminophen     REACTION: [place]   Rofecoxib     unk unk    Sulfamethoxazole     REACTION: unspecified   Sulfonamide Derivatives     REACTION: unspecified   Celecoxib Rash    REACTION: [place] REACTION: [place]   Codeine Rash    REACTION: [place] REACTION: [place]   Hydrocod Poli-Chlorphe  Poli Er Rash   Propoxyphene Rash   Sulfa Antibiotics Rash    REACTION: unspecified REACTION: unspecified    Current Outpatient Medications on File Prior to Visit  Medication Sig Dispense Refill   apixaban (ELIQUIS) 5 MG TABS tablet Take 2  tablets (10 mg total) by mouth 2 (two) times daily. For 7 days ( 1 week ) ONLY.  THEN: Take 1 tablet ( 5 mg ) by mouth 2 ( two ) times daily. 60 tablet 5   Bempedoic Acid-Ezetimibe (NEXLIZET) 180-10 MG TABS TAKE 1 TABLET BY MOUTH DAILY 90 tablet 3   brimonidine-timolol (COMBIGAN) 0.2-0.5 % ophthalmic solution Place 1 drop into both eyes every 12 (twelve) hours.     Cholecalciferol (VITAMIN D-3) 1000 UNITS CAPS Take 1 capsule by mouth daily.     diclofenac Sodium (VOLTAREN) 1 % GEL APPLY 2 GRAMS TOPICALLY THREE TIMES A DAY AS NEEDED 100 g 2   fexofenadine (ALLEGRA) 180 MG tablet Take 1 tablet (180 mg total) by mouth as needed for allergies or rhinitis. 90 tablet 0   gabapentin (NEURONTIN) 100 MG capsule Take 1 capsule (100 mg total) by mouth 3 (three) times daily. 180 capsule 1   glipiZIDE (GLUCOTROL XL) 10 MG 24 hr tablet TAKE ONE TABLET BY MOUTH EVERY MORNING WITH BREAKFAST FOR DIABETES 90 tablet 1   isosorbide mononitrate (IMDUR) 60 MG 24 hr tablet Take 1.5 tablets (90 mg total) by mouth daily. 135 tablet 1   metoprolol succinate (TOPROL-XL) 100 MG 24 hr tablet TAKE ONE TABLET BY MOUTH EVERY MORNING WITH OR IMMEDIATELY FOLLOWING A MEAL 90 tablet 2   nitroGLYCERIN (NITROSTAT) 0.4 MG SL tablet Place 1 tablet (0.4 mg total) under the tongue every 5 (five) minutes as needed for chest pain. 25 tablet 3   pantoprazole (PROTONIX) 20 MG tablet TAKE 1 TABLET BY MOUTH DAILY FOR HEARTBURN 90 tablet 2   sertraline (ZOLOFT) 100 MG tablet TAKE 1 TABLET BY MOUTH DAILY FOR ANXIETY AND DEPRESSION 90 tablet 2   vitamin B-12 (CYANOCOBALAMIN) 1000 MCG tablet Take 1,000 mcg by mouth daily.     dorzolamide-timolol (COSOPT) 2-0.5 % ophthalmic solution SMARTSIG:In Eye(s) (Patient  not taking: Reported on 02/16/2023)     ferrous sulfate 325 (65 FE) MG tablet Take 1 tablet (325 mg total) by mouth daily with breakfast. (Patient not taking: Reported on 01/28/2023) 30 tablet 2   timolol (TIMOPTIC) 0.5 % ophthalmic solution  (Patient not taking: Reported on 02/16/2023)     VYZULTA 0.024 % SOLN Apply 1 drop to eye at bedtime. (Patient not taking: Reported on 02/16/2023)     [DISCONTINUED] loratadine (CLARITIN) 10 MG tablet Take 10 mg by mouth as needed.      No current facility-administered medications on file prior to visit.    BP 132/74   Pulse 74   Temp 98.6 F (37 C) (Temporal)   Ht 5\' 9"  (1.753 m)   Wt 163 lb (73.9 kg)   SpO2 98%   BMI 24.07 kg/m  Objective:   Physical Exam Cardiovascular:     Rate and Rhythm: Normal rate and regular rhythm.  Pulmonary:     Effort: Pulmonary effort is normal.     Breath sounds: Normal breath sounds.  Musculoskeletal:     Cervical back: Neck supple.  Skin:    General: Skin is warm and dry.           Assessment & Plan:  Type 2 diabetes mellitus with stage 3b chronic kidney disease, without long-term current use of insulin (HCC) Assessment & Plan: Controlled and improved with A1C of 6.4 today.   Discussed the importance of exercise and monitoring diet.   New glucometer kit sent to pharmacy.  Continue Glipizide XL 10 mg daily.  Intolerant to statin therapy.  Foot exam deferred today. Pneumonia vaccine UTD  Follow up in 6 months.  I evaluated patient, was consulted regarding treatment, and agree with assessment and plan per Tinnie Gens, RN, DNP student.   Allie Bossier, NP-C   Orders: -     POCT glycosylated hemoglobin (Hb A1C) -     Blood Glucose Monitoring Suppl; 1 each by Does not apply route in the morning, at noon, and at bedtime. May substitute to any manufacturer covered by patient's insurance.  Dispense: 1 each; Refill: 0 -     Blood Glucose Test; 1 each by In Vitro route in the morning, at noon, and at  bedtime. May substitute to any manufacturer covered by patient's insurance.  Dispense: 300 strip; Refill: 5 -     Lancet Device; 1 each by Does not apply route in the morning, at noon, and at bedtime. May substitute to any manufacturer covered by patient's insurance.  Dispense: 1 each; Refill: 0 -     Lancets Misc.; 1 each by Does not apply route in the morning, at noon, and at bedtime. May substitute to any manufacturer covered by patient's insurance.  Dispense: 300 each; Refill: [place], NP

## 2023-02-18 ENCOUNTER — Ambulatory Visit: Payer: Medicare PPO | Admitting: Podiatry

## 2023-02-20 ENCOUNTER — Other Ambulatory Visit: Payer: Self-pay | Admitting: Primary Care

## 2023-02-20 DIAGNOSIS — E0821 Diabetes mellitus due to underlying condition with diabetic nephropathy: Secondary | ICD-10-CM

## 2023-03-06 ENCOUNTER — Other Ambulatory Visit: Payer: Self-pay | Admitting: Podiatry

## 2023-03-16 ENCOUNTER — Ambulatory Visit: Payer: Medicare PPO | Admitting: Primary Care

## 2023-03-16 ENCOUNTER — Encounter: Payer: Self-pay | Admitting: Primary Care

## 2023-03-16 VITALS — BP 144/76 | HR 65 | Temp 98.1°F | Ht 69.0 in | Wt 164.0 lb

## 2023-03-16 DIAGNOSIS — M25562 Pain in left knee: Secondary | ICD-10-CM

## 2023-03-16 DIAGNOSIS — M25561 Pain in right knee: Secondary | ICD-10-CM | POA: Diagnosis not present

## 2023-03-16 DIAGNOSIS — G8929 Other chronic pain: Secondary | ICD-10-CM

## 2023-03-16 DIAGNOSIS — R600 Localized edema: Secondary | ICD-10-CM | POA: Diagnosis not present

## 2023-03-16 DIAGNOSIS — M170 Bilateral primary osteoarthritis of knee: Secondary | ICD-10-CM | POA: Diagnosis not present

## 2023-03-16 NOTE — Addendum Note (Signed)
Addended by: Doreene Nest on: 03/16/2023 11:36 AM   Modules accepted: Orders

## 2023-03-16 NOTE — Patient Instructions (Signed)
Schedule an appointment with Dr. Patsy Lager as discussed.   Increase your gabapentin. Take 1 pill in the morning and 2 pills in the evening.   Try using diclofenac (Voltaren) gel three times daily to your knees.   It was a pleasure to see you today!

## 2023-03-16 NOTE — Progress Notes (Addendum)
Subjective:    Patient ID: [name] [name], female    DOB: [birth year], 89 y.o.   MRN: [id number]  HPI  [name] [name] is a very pleasant 89 y.o. female with a history of hypertension, CAD, mitral valve prolapse, mitral regurgitation type 2 diabetes, chronic back pain, DVT, chronic bilateral knee pain who presents today to discuss bilateral knee pain.  Her pain begins to the right foot with radiation to the right lower extremity through the knee. She's also noticed swelling to the right knee, left lower extremity pain, bilateral foot tingling. Symptoms began about 1 month ago.   Diagnosed with right lower extremity DVT in January 2024 and is managed on Eliquis 5 mg BID since.   Follows with podiatry, last office visit was March 2024, underwent injection to right foot. Evaluated by Dr. Patsy Lager in October 2023 for bilateral knee and lower extremity pain, xrays from October 2023 with bilateral arthritis with small joint effusions. She underwent injection of bilateral knees at the time.   She has been wearing compression socks for months. She is managed on gabapentin 100 mg TID per podiatry, she takes BID. She has been wearing compression socks. She tested negative for gout.   She would like a prescription for a Rolator walker. She cannot walk very far by using her cane alone, often has to sit and rest due to her knee pain and swelling.   Review of Systems  Musculoskeletal:  Positive for arthralgias and joint swelling.  Skin:  Negative for color change.  Neurological:  Positive for numbness.         Past Medical History:  Diagnosis Date   Acute bilateral thoracic back pain 08/16/2019   Allergy    Cipro, Plavix, Statins   Anemia    Arthritis    CAD (coronary artery disease)    a. s/p tandem Promus DES to LAD in 2009 by Dr. Juanda Chance;  b.  LHC (03/22/14):  prox LAD 30%, mid LAD 40%, LAD stent ok with dist 20% ISR, apical LAD occluded with L-L collats filling apical vessel (too small for PCI),  mid RCA 20%, EF 70%.  Med Rx   Cancer    Chronic kidney disease, stage 3b    Colon polyps    Diabetes mellitus    Diagnosed 2012   Diverticulosis of colon (without mention of hemorrhage)    DVT (deep venous thrombosis)    GERD (gastroesophageal reflux disease)    Glaucoma    Herpes zoster without complication 04/24/2020   Hyperlipidemia    intol of statins   Hypertension    Kidney stones    NHL (non-Hodgkin's lymphoma) dx'd 2003   chemo/xrt comp 2003   Personal history of colonic polyps    Proctitis    Pruritic intertrigo 07/18/2014   Urticaria     Social History   Socioeconomic History   Marital status: Widowed    Spouse name: Not on file   Number of children: 0   Years of education: Not on file   Highest education level: 12th grade  Occupational History   Occupation: Retired    Associate Professor: RETIRED   Occupation: retired  Tobacco Use   Smoking status: Never   Smokeless tobacco: Never  Building services engineer Use: Never used  Substance and Sexual Activity   Alcohol use: No   Drug use: No   Sexual activity: Not Currently  Other Topics Concern   Not on file  Social History Narrative   Not  on file   Social Determinants of Health   Financial Resource Strain: Low Risk  (02/16/2023)   Overall Financial Resource Strain (CARDIA)    Difficulty of Paying Living Expenses: Not hard at all  Food Insecurity: No Food Insecurity (02/16/2023)   Hunger Vital Sign    Worried About Running Out of Food in the Last Year: Never true    Ran Out of Food in the Last Year: Never true  Transportation Needs: No Transportation Needs (02/16/2023)   PRAPARE - Administrator, Civil Service (Medical): No    Lack of Transportation (Non-Medical): No  Physical Activity: Inactive (06/25/2022)   Exercise Vital Sign    Days of Exercise per Week: 0 days    Minutes of Exercise per Session: 0 min  Stress: No Stress Concern Present (06/25/2022)   [employer] of Occupational Health -  Occupational Stress Questionnaire    Feeling of Stress : Not at all  Social Connections: Moderately Integrated (02/16/2023)   Social Connection and Isolation Panel [NHANES]    Frequency of Communication with Friends and Family: More than three times a week    Frequency of Social Gatherings with Friends and Family: Once a week    Attends Religious Services: More than 4 times per year    Active Member of [employer] or Organizations: Yes    Attends Banker Meetings: More than 4 times per year    Marital Status: Widowed  Intimate Partner Violence: Not At Risk (06/23/2021)   Humiliation, Afraid, Rape, and Kick questionnaire    Fear of Current or Ex-Partner: No    Emotionally Abused: No    Physically Abused: No    Sexually Abused: No    Past Surgical History:  Procedure Laterality Date   cardiac cath-neg     CORONARY ANGIOPLASTY WITH STENT PLACEMENT     x 2   dexa-neg     KNEE SURGERY  10/2003   left   laser surgery for cataracts-left     LEFT HEART CATHETERIZATION WITH CORONARY ANGIOGRAM N/A 03/22/2014   Procedure: LEFT HEART CATHETERIZATION WITH CORONARY ANGIOGRAM;  Surgeon: Kathleene Hazel, MD;  Location: [facility] CATH LAB;  Service: Cardiovascular;  Laterality: N/A;   stress cardiolite     VAGINAL HYSTERECTOMY     partial, fibroids, one ovary left    Family History  Problem Relation Age of Onset   Cancer Mother        jaw   Hypertension Father    Heart attack Father    Heart attack Sister    Colon cancer Neg Hx     Allergies  Allergen Reactions   Other Rash and Anaphylaxis   Shellfish Allergy Anaphylaxis and Rash   Cephalexin     REACTION: trash and swelling REACTION: trash and swelling   Ciprofloxacin     REACTION: u/k   Clopidogrel Bisulfate     REACTION: swelling, rash   Codeine Phosphate     REACTION: unspecified   Ezetimibe-Simvastatin     REACTION: myalgias REACTION: myalgias   Hydrocod Poli-Chlorphe Poli Er     REACTION: u/k   Lidocaine Hives     ONLY TO ADHESIVE LIDOCAINE PATCHES. NO PROBLEM WITH INJECTABLE LIDOCAINE.   Nitrofurantoin     REACTION: [place] REACTION: [place]   Pregabalin     REACTION: felt bad REACTION: felt bad   Propoxyphene N-Acetaminophen     REACTION: [place]   Rofecoxib     unk unk    Sulfamethoxazole  REACTION: unspecified   Sulfonamide Derivatives     REACTION: unspecified   Celecoxib Rash    REACTION: [place] REACTION: [place]   Codeine Rash    REACTION: [place] REACTION: [place]   Hydrocod Poli-Chlorphe Poli Er Rash   Propoxyphene Rash   Sulfa Antibiotics Rash    REACTION: unspecified REACTION: unspecified    Current Outpatient Medications on File Prior to Visit  Medication Sig Dispense Refill   apixaban (ELIQUIS) 5 MG TABS tablet Take 2 tablets (10 mg total) by mouth 2 (two) times daily. For 7 days ( 1 week ) ONLY.  THEN: Take 1 tablet ( 5 mg ) by mouth 2 ( two ) times daily. 60 tablet 5   Bempedoic Acid-Ezetimibe (NEXLIZET) 180-10 MG TABS TAKE 1 TABLET BY MOUTH DAILY 90 tablet 3   Blood Glucose Monitoring Suppl DEVI 1 each by Does not apply route in the morning, at noon, and at bedtime. May substitute to any manufacturer covered by patient's insurance. 1 each 0   brimonidine-timolol (COMBIGAN) 0.2-0.5 % ophthalmic solution Place 1 drop into both eyes every 12 (twelve) hours.     Cholecalciferol (VITAMIN D-3) 1000 UNITS CAPS Take 1 capsule by mouth daily.     diclofenac Sodium (VOLTAREN) 1 % GEL APPLY 2 GRAMS TOPICALLY THREE TIMES A DAY AS NEEDED 100 g 2   fexofenadine (ALLEGRA) 180 MG tablet Take 1 tablet (180 mg total) by mouth as needed for allergies or rhinitis. 90 tablet 0   gabapentin (NEURONTIN) 100 MG capsule TAKE ONE CAPSULE BY MOUTH THREE TIMES A DAY 180 capsule 1   glipiZIDE (GLUCOTROL XL) 10 MG 24 hr tablet TAKE ONE TABLET BY MOUTH EVERY MORNING WITH BREAKFAST FOR DIABETES 90 tablet 1   Glucose Blood (BLOOD GLUCOSE TEST STRIPS) STRP 1 each by In Vitro route in the morning, at noon, and at bedtime. May  substitute to any manufacturer covered by patient's insurance. 300 strip 5   isosorbide mononitrate (IMDUR) 60 MG 24 hr tablet Take 1.5 tablets (90 mg total) by mouth daily. 135 tablet 1   Lancet Device MISC 1 each by Does not apply route in the morning, at noon, and at bedtime. May substitute to any manufacturer covered by patient's insurance. 1 each 0   Lancets Misc. MISC 1 each by Does not apply route in the morning, at noon, and at bedtime. May substitute to any manufacturer covered by patient's insurance. 300 each 5   metoprolol succinate (TOPROL-XL) 100 MG 24 hr tablet TAKE ONE TABLET BY MOUTH EVERY MORNING WITH OR IMMEDIATELY FOLLOWING A MEAL 90 tablet 2   nitroGLYCERIN (NITROSTAT) 0.4 MG SL tablet Place 1 tablet (0.4 mg total) under the tongue every 5 (five) minutes as needed for chest pain. 25 tablet 3   pantoprazole (PROTONIX) 20 MG tablet TAKE 1 TABLET BY MOUTH DAILY FOR HEARTBURN 90 tablet 2   sertraline (ZOLOFT) 100 MG tablet TAKE 1 TABLET BY MOUTH DAILY FOR ANXIETY AND DEPRESSION 90 tablet 2   vitamin B-12 (CYANOCOBALAMIN) 1000 MCG tablet Take 1,000 mcg by mouth daily.     dorzolamide-timolol (COSOPT) 2-0.5 % ophthalmic solution SMARTSIG:In Eye(s) (Patient not taking: Reported on 02/16/2023)     ferrous sulfate 325 (65 FE) MG tablet Take 1 tablet (325 mg total) by mouth daily with breakfast. (Patient not taking: Reported on 01/28/2023) 30 tablet 2   timolol (TIMOPTIC) 0.5 % ophthalmic solution  (Patient not taking: Reported on 02/16/2023)     VYZULTA 0.024 % SOLN Apply 1 drop  to eye at bedtime. (Patient not taking: Reported on 02/16/2023)     [DISCONTINUED] loratadine (CLARITIN) 10 MG tablet Take 10 mg by mouth as needed.      No current facility-administered medications on file prior to visit.    BP (!) 144/76   Pulse 65   Temp 98.1 F (36.7 C) (Temporal)   Ht 5\' 9"  (1.753 m)   Wt 164 lb (74.4 kg)   SpO2 98%   BMI 24.22 kg/m  Objective:   Physical Exam Constitutional:       General: She is not in acute distress. Cardiovascular:     Pulses:          Dorsalis pedis pulses are 2+ on the right side.       Posterior tibial pulses are 2+ on the right side.     Comments: Mild swelling to right lower extremity including right knee joint.  Pulmonary:     Effort: Pulmonary effort is normal.  Skin:    General: Skin is warm and dry.  Neurological:     Mental Status: She is alert.           Assessment & Plan:  Bilateral lower extremity edema Assessment & Plan: Exam today suggests osteoarthritis as cause. Uric acid level negative from 2021. Lower suspicion for DVT, especially given exam and management on Eliquis.  Recommended she set up another visit with Sports Medicine for steroid injections to the knees.  Reviewed knee xrays from 2023. Consider orthopedic referral.    Chronic pain of both knees Assessment & Plan: Progressing.   Recommended she set up another visit with Sports Medicine for steroid injections to the knees. Discussed use of Voltaren Gel.  Rolator walker order placed.  Reviewed knee xrays from 2023. Consider orthopedic referral.   Orders: -     For home use only DME Other see comment  Primary osteoarthritis of knees, bilateral -     For home use only DME Other see comment        Doreene Nest, NP

## 2023-03-16 NOTE — Assessment & Plan Note (Addendum)
Progressing.   Recommended she set up another visit with Sports Medicine for steroid injections to the knees. Discussed use of Voltaren Gel.  Rolator walker order placed.  Reviewed knee xrays from 2023. Consider orthopedic referral.

## 2023-03-16 NOTE — Assessment & Plan Note (Signed)
Exam today suggests osteoarthritis as cause. Uric acid level negative from 2021. Lower suspicion for DVT, especially given exam and management on Eliquis.  Recommended she set up another visit with Sports Medicine for steroid injections to the knees.  Reviewed knee xrays from 2023. Consider orthopedic referral.

## 2023-03-17 DIAGNOSIS — M170 Bilateral primary osteoarthritis of knee: Secondary | ICD-10-CM | POA: Diagnosis not present

## 2023-03-21 ENCOUNTER — Encounter: Payer: Self-pay | Admitting: Physician Assistant

## 2023-03-21 ENCOUNTER — Ambulatory Visit: Payer: Medicare PPO | Admitting: Physician Assistant

## 2023-03-21 ENCOUNTER — Other Ambulatory Visit (INDEPENDENT_AMBULATORY_CARE_PROVIDER_SITE_OTHER): Payer: Medicare PPO

## 2023-03-21 VITALS — BP 150/60 | HR 76 | Ht 69.0 in | Wt 163.8 lb

## 2023-03-21 DIAGNOSIS — R195 Other fecal abnormalities: Secondary | ICD-10-CM | POA: Diagnosis not present

## 2023-03-21 DIAGNOSIS — Z7901 Long term (current) use of anticoagulants: Secondary | ICD-10-CM | POA: Diagnosis not present

## 2023-03-21 DIAGNOSIS — I82401 Acute embolism and thrombosis of unspecified deep veins of right lower extremity: Secondary | ICD-10-CM

## 2023-03-21 DIAGNOSIS — D509 Iron deficiency anemia, unspecified: Secondary | ICD-10-CM | POA: Diagnosis not present

## 2023-03-21 LAB — CBC WITH DIFFERENTIAL/PLATELET
Basophils Absolute: 0 10*3/uL (ref 0.0–0.1)
Basophils Relative: 0.4 % (ref 0.0–3.0)
Eosinophils Absolute: 0.1 10*3/uL (ref 0.0–0.7)
Eosinophils Relative: 1.7 % (ref 0.0–5.0)
HCT: 28 % — ABNORMAL LOW (ref 36.0–46.0)
Hemoglobin: 9 g/dL — ABNORMAL LOW (ref 12.0–15.0)
Lymphocytes Relative: 33.5 % (ref 12.0–46.0)
Lymphs Abs: 1.1 10*3/uL (ref 0.7–4.0)
MCHC: 32.1 g/dL (ref 30.0–36.0)
MCV: 83.7 fl (ref 78.0–100.0)
Monocytes Absolute: 0.4 10*3/uL (ref 0.1–1.0)
Monocytes Relative: 10.5 % (ref 3.0–12.0)
Neutro Abs: 1.8 10*3/uL (ref 1.4–7.7)
Neutrophils Relative %: 53.9 % (ref 43.0–77.0)
Platelets: 289 10*3/uL (ref 150.0–400.0)
RBC: 3.35 Mil/uL — ABNORMAL LOW (ref 3.87–5.11)
RDW: 14.9 % (ref 11.5–15.5)
WBC: 3.4 10*3/uL — ABNORMAL LOW (ref 4.0–10.5)

## 2023-03-21 LAB — COMPREHENSIVE METABOLIC PANEL
ALT: 7 U/L (ref 0–35)
AST: 21 U/L (ref 0–37)
Albumin: 4.3 g/dL (ref 3.5–5.2)
Alkaline Phosphatase: 31 U/L — ABNORMAL LOW (ref 39–117)
BUN: 23 mg/dL (ref 6–23)
CO2: 26 mEq/L (ref 19–32)
Calcium: 9.3 mg/dL (ref 8.4–10.5)
Chloride: 104 mEq/L (ref 96–112)
Creatinine, Ser: 1.28 mg/dL — ABNORMAL HIGH (ref 0.40–1.20)
GFR: 37.24 mL/min — ABNORMAL LOW (ref >60.00)
Glucose, Bld: 158 mg/dL — ABNORMAL HIGH (ref 70–99)
Potassium: 5 mEq/L (ref 3.5–5.1)
Sodium: 138 mEq/L (ref 135–145)
Total Bilirubin: 0.5 mg/dL (ref 0.2–1.2)
Total Protein: 7.1 g/dL (ref 6.0–8.3)

## 2023-03-21 LAB — IBC + FERRITIN
Ferritin: 10.5 ng/mL (ref 10.0–291.0)
Iron: 41 ug/dL — ABNORMAL LOW (ref 42–145)
Saturation Ratios: 9 % — ABNORMAL LOW (ref 20.0–50.0)
TIBC: 455 ug/dL — ABNORMAL HIGH (ref 250.0–450.0)
Transferrin: 325 mg/dL (ref 212.0–360.0)

## 2023-03-21 NOTE — Progress Notes (Signed)
Addendum: Reviewed and agree with assessment and management plan. Her IDA is concerning for GI blood loss and typically this would be investigated with upper and lower endoscopy. Patient is hesitant to proceed with these procedures based on age. I would recommend we proceed with a CT scan of the abdomen and pelvis with contrast to exclude overt source of GI blood loss such as malignancy  Eufemia Prindle, Carie Caddy, MD

## 2023-03-21 NOTE — Progress Notes (Signed)
Chief Complaint: Hemoccult + IDA  HPI: [name] [name] is an 89 year old female, known to Dr. Rhea Belton, with a past medical history as listed below including CAD on Eliquis (04/07/2022 echo with mild aortic insufficiency mild diastolic dysfunction and mild left ventricular hypertrophy with a normal EF), CKD stage III, GERD, history of DVT diagnosed in January and multiple others, who was referred to me by Doreene Nest, NP for a complaint of Hemoccult positive iron deficiency anemia.    2010 colonoscopy with proctitis.    10/08/2022 patient seen by Willette Cluster in clinic and at that time had FOBT positive normocytic anemia with a baseline hemoglobin of 13 down to 10.9 with no overt GI bleeding.  That time updated CBC and iron studies.  At that point discussed an EGD colonoscopy but she wanted to wait until her labs resulted.  Her reflux was controlled on daily PPI.    10/08/2022 labs showed a hemoglobin of 11.5 (up from 10.9 a month prior) and iron studies with a ferritin of 20.  At that time wanted to continue to discuss EGD and colonoscopy.  She does not want to proceed with EGD or colonoscopy due to age.  She was started on ferrous sulfate once daily.    12/24/2022 fecal occult positive.    12/29/2022 CBC with a hemoglobin of 9.8 (11.1 on 12/20/2022).  Iron panel with an iron of 48, percent saturation decreased at 13 and ferritin low at 13.    Today, the patient presents to clinic and tells me that in January she was diagnosed with a blood clot in her leg and since then has been on Eliquis.  Tells me that her physician does not want her to stop the Eliquis for 6 months.  She went to her PCP around the same time and had labs drawn showing her iron was low and her hemoglobin was low.  They urged her to follow-up with [place] in regards to this.  She tells me that previously when this was discussed back in November she explained things to her care providers and no one wanted her to have procedures given that  she is at increased risk due to age.  Nothing has really changed with that.  She is not eager to have procedures and the only reason they would want them done is if she definitively needs them.  She is not seeing any blood in her stool.  Interestingly though iron is on her list she tells me she has not been taking it and does not ever recall taking it.  Does describe some increased shortness of breath if she does something very strenuous but this has been off and on for years.  No other changes GI wise or other.    Patient is going to follow-up with an orthopedic doctor about some knee pain/arthritis and foot pain soon.    Denies fever, chills, weight loss, nausea, vomiting, heartburn, reflux, abdominal pain or change in bowel habits.  Past Medical History:  Diagnosis Date   Acute bilateral thoracic back pain 08/16/2019   Allergy    Cipro, Plavix, Statins   Anemia    Arthritis    CAD (coronary artery disease)    a. s/p tandem Promus DES to LAD in 2009 by Dr. Juanda Chance;  b.  LHC (03/22/14):  prox LAD 30%, mid LAD 40%, LAD stent ok with dist 20% ISR, apical LAD occluded with L-L collats filling apical vessel (too small for PCI), mid RCA 20%, EF 70%.  Med Rx   Cancer    Chronic kidney disease, stage 3b    Colon polyps    Diabetes mellitus    Diagnosed 2012   Diverticulosis of colon (without mention of hemorrhage)    DVT (deep venous thrombosis)    GERD (gastroesophageal reflux disease)    Glaucoma    Herpes zoster without complication 04/24/2020   Hyperlipidemia    intol of statins   Hypertension    Kidney stones    NHL (non-Hodgkin's lymphoma) dx'd 2003   chemo/xrt comp 2003   Personal history of colonic polyps    Proctitis    Pruritic intertrigo 07/18/2014   Urticaria     Past Surgical History:  Procedure Laterality Date   cardiac cath-neg     CORONARY ANGIOPLASTY WITH STENT PLACEMENT     x 2   dexa-neg     KNEE SURGERY  10/2003   left   laser surgery for cataracts-left      LEFT HEART CATHETERIZATION WITH CORONARY ANGIOGRAM N/A 03/22/2014   Procedure: LEFT HEART CATHETERIZATION WITH CORONARY ANGIOGRAM;  Surgeon: Kathleene Hazel, MD;  Location: [facility] CATH LAB;  Service: Cardiovascular;  Laterality: N/A;   stress cardiolite     VAGINAL HYSTERECTOMY     partial, fibroids, one ovary left    Current Outpatient Medications  Medication Sig Dispense Refill   apixaban (ELIQUIS) 5 MG TABS tablet Take 2 tablets (10 mg total) by mouth 2 (two) times daily. For 7 days ( 1 week ) ONLY.  THEN: Take 1 tablet ( 5 mg ) by mouth 2 ( two ) times daily. 60 tablet 5   Bempedoic Acid-Ezetimibe (NEXLIZET) 180-10 MG TABS TAKE 1 TABLET BY MOUTH DAILY 90 tablet 3   Blood Glucose Monitoring Suppl DEVI 1 each by Does not apply route in the morning, at noon, and at bedtime. May substitute to any manufacturer covered by patient's insurance. 1 each 0   brimonidine-timolol (COMBIGAN) 0.2-0.5 % ophthalmic solution Place 1 drop into both eyes every 12 (twelve) hours.     Cholecalciferol (VITAMIN D-3) 1000 UNITS CAPS Take 1 capsule by mouth daily.     diclofenac Sodium (VOLTAREN) 1 % GEL APPLY 2 GRAMS TOPICALLY THREE TIMES A DAY AS NEEDED 100 g 2   dorzolamide-timolol (COSOPT) 2-0.5 % ophthalmic solution SMARTSIG:In Eye(s) (Patient not taking: Reported on 02/16/2023)     ferrous sulfate 325 (65 FE) MG tablet Take 1 tablet (325 mg total) by mouth daily with breakfast. (Patient not taking: Reported on 01/28/2023) 30 tablet 2   fexofenadine (ALLEGRA) 180 MG tablet Take 1 tablet (180 mg total) by mouth as needed for allergies or rhinitis. 90 tablet 0   gabapentin (NEURONTIN) 100 MG capsule TAKE ONE CAPSULE BY MOUTH THREE TIMES A DAY 180 capsule 1   glipiZIDE (GLUCOTROL XL) 10 MG 24 hr tablet TAKE ONE TABLET BY MOUTH EVERY MORNING WITH BREAKFAST FOR DIABETES 90 tablet 1   Glucose Blood (BLOOD GLUCOSE TEST STRIPS) STRP 1 each by In Vitro route in the morning, at noon, and at bedtime. May substitute to any  manufacturer covered by patient's insurance. 300 strip 5   isosorbide mononitrate (IMDUR) 60 MG 24 hr tablet Take 1.5 tablets (90 mg total) by mouth daily. 135 tablet 1   Lancets Misc. MISC 1 each by Does not apply route in the morning, at noon, and at bedtime. May substitute to any manufacturer covered by patient's insurance. 300 each 5   metoprolol succinate (TOPROL-XL) 100 MG 24  hr tablet TAKE ONE TABLET BY MOUTH EVERY MORNING WITH OR IMMEDIATELY FOLLOWING A MEAL 90 tablet 2   nitroGLYCERIN (NITROSTAT) 0.4 MG SL tablet Place 1 tablet (0.4 mg total) under the tongue every 5 (five) minutes as needed for chest pain. 25 tablet 3   pantoprazole (PROTONIX) 20 MG tablet TAKE 1 TABLET BY MOUTH DAILY FOR HEARTBURN 90 tablet 2   sertraline (ZOLOFT) 100 MG tablet TAKE 1 TABLET BY MOUTH DAILY FOR ANXIETY AND DEPRESSION 90 tablet 2   timolol (TIMOPTIC) 0.5 % ophthalmic solution  (Patient not taking: Reported on 02/16/2023)     vitamin B-12 (CYANOCOBALAMIN) 1000 MCG tablet Take 1,000 mcg by mouth daily.     VYZULTA 0.024 % SOLN Apply 1 drop to eye at bedtime. (Patient not taking: Reported on 02/16/2023)     No current facility-administered medications for this visit.    Allergies as of 03/21/2023 - Review Complete 03/21/2023  Allergen Reaction Noted   Other Rash and Anaphylaxis 03/04/2015   Shellfish allergy Anaphylaxis and Rash 12/10/2011   Cephalexin  01/10/2008   Ciprofloxacin     Clopidogrel bisulfate     Codeine phosphate  01/24/2007   Ezetimibe-simvastatin  01/24/2007   Hydrocod poli-chlorphe poli er     Lidocaine Hives 11/13/2015   Nitrofurantoin     Pregabalin     Propoxyphene n-acetaminophen     Rofecoxib     Sulfamethoxazole  01/24/2007   Sulfonamide derivatives  01/24/2007   Celecoxib Rash 02/17/2015   Codeine Rash 02/17/2015   Hydrocod poli-chlorphe poli er Rash 03/04/2015   Propoxyphene Rash 03/04/2015   Sulfa antibiotics Rash 02/17/2015    Family History  Problem Relation Age  of Onset   Cancer Mother        jaw   Hypertension Father    Heart attack Father    Heart attack Sister    Colon cancer Neg Hx     Social History   Socioeconomic History   Marital status: Widowed    Spouse name: Not on file   Number of children: 0   Years of education: Not on file   Highest education level: 12th grade  Occupational History   Occupation: Retired    Associate Professor: RETIRED   Occupation: retired  Tobacco Use   Smoking status: Never   Smokeless tobacco: Never  Building services engineer Use: Never used  Substance and Sexual Activity   Alcohol use: No   Drug use: No   Sexual activity: Not Currently  Other Topics Concern   Not on file  Social History Narrative   Not on file   Social Determinants of Health   Financial Resource Strain: Low Risk  (02/16/2023)   Overall Financial Resource Strain (CARDIA)    Difficulty of Paying Living Expenses: Not hard at all  Food Insecurity: No Food Insecurity (02/16/2023)   Hunger Vital Sign    Worried About Running Out of Food in the Last Year: Never true    Ran Out of Food in the Last Year: Never true  Transportation Needs: No Transportation Needs (02/16/2023)   PRAPARE - Administrator, Civil Service (Medical): No    Lack of Transportation (Non-Medical): No  Physical Activity: Inactive (06/25/2022)   Exercise Vital Sign    Days of Exercise per Week: 0 days    Minutes of Exercise per Session: 0 min  Stress: No Stress Concern Present (06/25/2022)   [employer] of Occupational Health - Occupational Stress Questionnaire  Feeling of Stress : Not at all  Social Connections: Moderately Integrated (02/16/2023)   Social Connection and Isolation Panel [NHANES]    Frequency of Communication with Friends and Family: More than three times a week    Frequency of Social Gatherings with Friends and Family: Once a week    Attends Religious Services: More than 4 times per year    Active Member of [employer] or Organizations: Yes     Attends Banker Meetings: More than 4 times per year    Marital Status: Widowed  Intimate Partner Violence: Not At Risk (06/23/2021)   Humiliation, Afraid, Rape, and Kick questionnaire    Fear of Current or Ex-Partner: No    Emotionally Abused: No    Physically Abused: No    Sexually Abused: No    Review of Systems:    Constitutional: No weight loss, fever or chills Cardiovascular: No chest pain, chest pressure or palpitations   Respiratory: See HPI Gastrointestinal: See HPI and otherwise negative   Physical Exam:  Vital signs: BP (!) 150/60   Pulse 76   Ht  (1.753 m)   Wt 163 lb 12.8 oz (74.3 kg)   BMI 24.19 kg/m    Constitutional:   Pleasant Elderly Caucasian female appears to be in NAD, Well developed, Well nourished, alert and cooperative +ambulates with walker Respiratory: Respirations even and unlabored. Lungs clear to auscultation bilaterally.   No wheezes, crackles, or rhonchi.  Cardiovascular: Normal S1, S2. No MRG. Regular rate and rhythm. No peripheral edema, cyanosis or pallor.  Gastrointestinal:  Soft, nondistended, nontender. No rebound or guarding. Normal bowel sounds. No appreciable masses or hepatomegaly. Rectal:  Not performed.  Psychiatric: Oriented to person, place and time. Demonstrates good judgement and reason without abnormal affect or behaviors.  RELEVANT LABS AND IMAGING: CBC    Component Value Date/Time   WBC 3.5 (L) 12/29/2022 1040   RBC 3.26 (L) 12/29/2022 1040   HGB 9.8 (L) 12/29/2022 1040   HGB 11.1 12/20/2022 1508   HGB 14.6 12/25/2007 0834   HCT 29.6 (L) 12/29/2022 1040   HCT 34.0 12/20/2022 1508   HCT 42.1 12/25/2007 0834   PLT 242.0 12/29/2022 1040   PLT 279 12/20/2022 1508   MCV 90.9 12/29/2022 1040   MCV 92 12/20/2022 1508   MCV 96.9 12/25/2007 0834   MCH 29.6 12/24/2022 1806   MCHC 33.0 12/29/2022 1040   RDW 15.7 (H) 12/29/2022 1040   RDW 14.2 12/20/2022 1508   RDW 13.2 12/25/2007 0834   LYMPHSABS 2.3  12/24/2022 1806   LYMPHSABS 1.8 12/20/2022 1508   LYMPHSABS 1.9 12/25/2007 0834   MONOABS 0.5 12/24/2022 1806   MONOABS 0.3 12/25/2007 0834   EOSABS 0.1 12/24/2022 1806   EOSABS 0.1 12/20/2022 1508   BASOSABS 0.0 12/24/2022 1806   BASOSABS 0.0 12/20/2022 1508   BASOSABS 0.0 12/25/2007 0834    CMP     Component Value Date/Time   NA 139 12/24/2022 1806   NA 141 12/20/2022 1508   K 4.5 12/24/2022 1806   CL 106 12/24/2022 1806   CO2 25 12/24/2022 1806   GLUCOSE 97 12/24/2022 1806   BUN 22 12/24/2022 1806   BUN 20 12/20/2022 1508   CREATININE 1.29 (H) 12/24/2022 1806   CALCIUM 9.0 12/24/2022 1806   PROT 6.6 12/24/2022 1806   ALBUMIN 3.6 12/24/2022 1806   AST 35 12/24/2022 1806   ALT 17 12/24/2022 1806   ALKPHOS 31 (L) 12/24/2022 1806   BILITOT 0.6 12/24/2022  1806   GFRNONAA 40 (L) 12/24/2022 1806   GFRAA 45 (L) 04/24/2020 0800    Assessment: 1.  Iron deficiency anemia: Patient has been anemic since the end of last year, at that time discussed EGD and colonoscopy and declined, nothing is really changed with her situation other than that she has not been on iron and her hemoglobin and iron are both decreasing; consider GI bleed versus other 2.  Hemoccult positive stool: With above 3.  DVT: Diagnosed in January and now on Eliquis  Plan: 1.  Again discussed EGD and colonoscopy.  She really does not want to have these procedures done unless "absolutely necessary".  At this point I do not think she has been on iron supplements so it is hard to tell if this would be enough for her.  We discussed that if just being on iron supplement or getting iron infusions through hematology department is enough to keep her levels up then she may not need to have procedures. 2.  Referred patient to hematology for iron deficiency anemia 3.  Recommend the patient start over-the-counter iron 65 mg daily 4.  We will repeat labs including CBC, CMP and iron studies with ferritin today 5.  Patient to  follow in clinic with [place] per recs after labs/as needed.  She would not be able to stop her Eliquis for another 3 months if we need to pursue EGD and colonoscopy.  Hyacinth Meeker, PA-C [facility] 03/21/2023, 9:16 AM  Cc: Doreene Nest, NP

## 2023-03-21 NOTE — Patient Instructions (Addendum)
Your provider has requested that you go to the basement level for lab work before leaving today. Press "B" on the elevator. The lab is located at the first door on the left as you exit the elevator.   We have placed a referral to Hematology.   Please purchase the following medications over the counter and take as directed: Iron 65 mg daily   Follow up as needed.  _______________________________________________________  If your blood pressure at your visit was 140/90 or greater, please contact your primary care physician to follow up on this.  _______________________________________________________  If you are age 87 or older, your body mass index should be between 23-30. Your Body mass index is 24.19 kg/m. If this is out of the aforementioned range listed, please consider follow up with your Primary Care Provider.  __________________________________________________________  The [facility] providers would like to encourage you to use [facility] to communicate with providers for non-urgent requests or questions.  Due to long hold times on the telephone, sending your provider a message by [facility] may be a faster and more efficient way to get a response.  Please allow 48 business hours for a response.  Please remember that this is for non-urgent requests.   Due to recent changes in healthcare laws, you may see the results of your imaging and laboratory studies on MyChart before your provider has had a chance to review them.  We understand that in some cases there may be results that are confusing or concerning to you. Not all laboratory results come back in the same time frame and the provider may be waiting for multiple results in order to interpret others.  Please give [place] 48 hours in order for your provider to thoroughly review all the results before contacting the office for clarification of your results.    Thank you for choosing me and [facility].

## 2023-03-22 NOTE — Progress Notes (Signed)
Left message for pt to call back  °

## 2023-03-23 ENCOUNTER — Telehealth: Payer: Self-pay

## 2023-03-23 DIAGNOSIS — D509 Iron deficiency anemia, unspecified: Secondary | ICD-10-CM

## 2023-03-23 DIAGNOSIS — R195 Other fecal abnormalities: Secondary | ICD-10-CM

## 2023-03-23 NOTE — Telephone Encounter (Signed)
Called and spoke with patient regarding recommendations. Pt would like to proceed with CT scan. Pt knows to expect a call from radiology scheduling to set up her appt. Pt has been advised to keep her hematology appt as scheduled. Pt verbalized understanding and had no concerns at the end of the call.   CT scan order in epic. Secure staff message sent to radiology scheduling to contact patient to set up appt.

## 2023-03-23 NOTE — Telephone Encounter (Signed)
-----   Message from [facility] [place], [place] sent at 03/22/2023  8:26 AM EDT ----- Regarding: CTAP with contrast Dr. Rhea Belton would like patient have a CT of her abdomen pelvis with contrast to rule out any overt causes for her iron deficiency anemia like cancer.  This would be a noninvasive way of at least making sure there is not a malignancy.  Please call and schedule the patient if she is willing.  Thanks, JL L ----- Message ----- From: Beverley Fiedler, MD Sent: 03/21/2023   4:22 PM EDT To: Unk Lightning, PA     ----- Message ----- From: Unk Lightning, [place] Sent: 03/21/2023   9:51 AM EDT To: Beverley Fiedler, MD

## 2023-03-25 NOTE — Telephone Encounter (Signed)
CTAP scheduled on 04/18/23 at 10 am

## 2023-03-27 NOTE — Progress Notes (Unsigned)
    Aram Domzalski T. Yitzchak Kothari, MD, CAQ Sports Medicine [facility] [address] [place], [address]  Phone: [phone]  FAX: [phone]  [name] [name] - 89 y.o. female  MRN [id number]  Date of Birth: 03/22/1932  Date: 03/28/2023  PCP: Doreene Nest, NP  Referral: Doreene Nest, NP  No chief complaint on file.  Subjective:   [name] [name] is a 89 y.o. very pleasant female patient with There is no height or weight on file to calculate BMI. who presents with the following:  Patient presents with bilateral chronic knee pain and chronic osteoarthritis of the knees bilaterally.    Review of Systems is noted in the HPI, as appropriate  Objective:   There were no vitals taken for this visit.  GEN: No acute distress; alert,appropriate. PULM: Breathing comfortably in no respiratory distress PSYCH: Normally interactive.   Laboratory and Imaging Data:  Assessment and Plan:   ***

## 2023-03-28 ENCOUNTER — Ambulatory Visit: Payer: Medicare PPO | Admitting: Family Medicine

## 2023-03-28 ENCOUNTER — Telehealth: Payer: Self-pay | Admitting: Hematology and Oncology

## 2023-03-28 ENCOUNTER — Encounter: Payer: Self-pay | Admitting: Family Medicine

## 2023-03-28 VITALS — BP 126/58 | HR 71 | Temp 97.9°F | Ht 69.0 in | Wt 165.0 lb

## 2023-03-28 DIAGNOSIS — M1711 Unilateral primary osteoarthritis, right knee: Secondary | ICD-10-CM

## 2023-03-28 DIAGNOSIS — Z7902 Long term (current) use of antithrombotics/antiplatelets: Secondary | ICD-10-CM

## 2023-03-28 DIAGNOSIS — M170 Bilateral primary osteoarthritis of knee: Secondary | ICD-10-CM

## 2023-03-28 MED ORDER — TRIAMCINOLONE ACETONIDE 40 MG/ML IJ SUSP
40.0000 mg | Freq: Once | INTRAMUSCULAR | Status: AC
Start: 2023-03-28 — End: 2023-03-28
  Administered 2023-03-28: 40 mg via INTRA_ARTICULAR

## 2023-03-28 NOTE — Telephone Encounter (Signed)
Patient called requesting to cancel her CT scan scheduled for 04/18/23.

## 2023-03-28 NOTE — Telephone Encounter (Signed)
patient left a voicemail to cancerl her appointment with [facility];

## 2023-03-28 NOTE — Telephone Encounter (Signed)
Left message for pt to call back  °

## 2023-03-30 NOTE — Telephone Encounter (Signed)
CT scan cancelled by patient.

## 2023-04-04 ENCOUNTER — Ambulatory Visit: Payer: Medicare PPO | Admitting: Cardiovascular Disease

## 2023-04-09 ENCOUNTER — Inpatient Hospital Stay: Payer: Medicare PPO | Admitting: Hematology and Oncology

## 2023-04-12 ENCOUNTER — Telehealth: Payer: Self-pay

## 2023-04-12 ENCOUNTER — Other Ambulatory Visit: Payer: Self-pay

## 2023-04-12 DIAGNOSIS — K219 Gastro-esophageal reflux disease without esophagitis: Secondary | ICD-10-CM

## 2023-04-12 DIAGNOSIS — I4891 Unspecified atrial fibrillation: Secondary | ICD-10-CM

## 2023-04-12 DIAGNOSIS — E0821 Diabetes mellitus due to underlying condition with diabetic nephropathy: Secondary | ICD-10-CM

## 2023-04-12 MED ORDER — APIXABAN 5 MG PO TABS
5.0000 mg | ORAL_TABLET | Freq: Two times a day (BID) | ORAL | 2 refills | Status: DC
Start: 2023-04-12 — End: 2023-04-21

## 2023-04-12 NOTE — Telephone Encounter (Signed)
Please call patient:  Please verify which pharmacy she is using. [facility]? Or [facility]. We received 3 refill requests for [facility] that have been seen to [facility] previously.

## 2023-04-12 NOTE — Telephone Encounter (Signed)
Received refill request from [facility] pharmacy for

## 2023-04-12 NOTE — Telephone Encounter (Signed)
Unable to reach patient. Left voicemail to return call to our office.   

## 2023-04-12 NOTE — Telephone Encounter (Signed)
Prescription refill request for Eliquis received. Indication: DVT Last office visit: 01/28/23 Clifton James)  Scr: 1.28(03/21/23)  Age: 89 Weight: 74.8kg  Appropriate dose. Refill sent. Per Dr Clifton James in office visit note, pt should follow-up with PCP for future DVT/Eliquis management.

## 2023-04-13 ENCOUNTER — Other Ambulatory Visit: Payer: Self-pay | Admitting: *Deleted

## 2023-04-13 ENCOUNTER — Telehealth: Payer: Self-pay

## 2023-04-13 ENCOUNTER — Other Ambulatory Visit (HOSPITAL_COMMUNITY): Payer: Self-pay

## 2023-04-13 MED ORDER — GLIPIZIDE ER 10 MG PO TB24
10.0000 mg | ORAL_TABLET | Freq: Every day | ORAL | 1 refills | Status: DC
Start: 2023-04-13 — End: 2024-04-30

## 2023-04-13 MED ORDER — METOPROLOL SUCCINATE ER 100 MG PO TB24: ORAL_TABLET | ORAL | 3 refills | Status: DC

## 2023-04-13 MED ORDER — PANTOPRAZOLE SODIUM 20 MG PO TBEC
DELAYED_RELEASE_TABLET | ORAL | 1 refills | Status: DC
Start: 2023-04-13 — End: 2023-09-14

## 2023-04-13 MED ORDER — NEXLIZET 180-10 MG PO TABS: 1.0000 | ORAL_TABLET | Freq: Every day | ORAL | 3 refills | Status: DC

## 2023-04-13 NOTE — Telephone Encounter (Signed)
Unable to reach patient. Left voicemail to return call to our office.   

## 2023-04-13 NOTE — Telephone Encounter (Addendum)
Pharmacy Patient Advocate Encounter   Received notification from [facility] that prior authorization for NEXLIZET 180-10MG  is required/requested.   PER TEST CLAIM;

## 2023-04-13 NOTE — Telephone Encounter (Signed)
Patient returned call,and stated that she has switched to [facility] due to her not being able to go pick up medication from [facility] all the time.

## 2023-04-13 NOTE — Addendum Note (Signed)
Addended by: Doreene Nest on: 04/13/2023 04:09 PM   Modules accepted: Orders

## 2023-04-13 NOTE — Telephone Encounter (Signed)
Noted.  I sent refills for glipizide and pantoprazole.  Her heart doctor prescribes the metoprolol.

## 2023-04-14 ENCOUNTER — Other Ambulatory Visit: Payer: Self-pay

## 2023-04-14 DIAGNOSIS — I4891 Unspecified atrial fibrillation: Secondary | ICD-10-CM

## 2023-04-15 ENCOUNTER — Ambulatory Visit (INDEPENDENT_AMBULATORY_CARE_PROVIDER_SITE_OTHER): Payer: Medicare PPO

## 2023-04-15 ENCOUNTER — Ambulatory Visit: Payer: Medicare PPO | Admitting: Podiatry

## 2023-04-15 DIAGNOSIS — E119 Type 2 diabetes mellitus without complications: Secondary | ICD-10-CM

## 2023-04-15 DIAGNOSIS — M7752 Other enthesopathy of left foot: Secondary | ICD-10-CM

## 2023-04-15 DIAGNOSIS — M7751 Other enthesopathy of right foot: Secondary | ICD-10-CM

## 2023-04-15 MED ORDER — BETAMETHASONE SOD PHOS & ACET 6 (3-3) MG/ML IJ SUSP
3.0000 mg | Freq: Once | INTRAMUSCULAR | Status: DC
Start: 2023-04-15 — End: 2023-05-25

## 2023-04-15 NOTE — Progress Notes (Signed)
Chief Complaint  Patient presents with   Foot Pain    Right foot heel arch and top of the foot pain rate of pain 7 out of 10, numbness in the left foot     SUBJECTIVE Patient with a history of diabetes mellitus presents to office today complaining of elongated, thickened nails that cause pain while ambulating in shoes.  Patient is unable to trim their own nails.  Patient states that injection she received last time to the second toe joint helped significantly for several months.  She is experiencing pain to both of the second toes bilaterally today.  She is requesting injection today.   Past Medical History:  Diagnosis Date   Acute bilateral thoracic back pain 08/16/2019   Allergy    Cipro, Plavix, Statins   Anemia    Arthritis    CAD (coronary artery disease)    a. s/p tandem Promus DES to LAD in 2009 by Dr. Juanda Chance;  b.  LHC (03/22/14):  prox LAD 30%, mid LAD 40%, LAD stent ok with dist 20% ISR, apical LAD occluded with L-L collats filling apical vessel (too small for PCI), mid RCA 20%, EF 70%.  Med Rx   Cancer [facility])    Chronic kidney disease, stage 3b (HCC)    Colon polyps    Diabetes mellitus    Diagnosed 2012   Diverticulosis of colon (without mention of hemorrhage)    DVT (deep venous thrombosis) (HCC)    GERD (gastroesophageal reflux disease)    Glaucoma    Herpes zoster without complication 04/24/2020   Hyperlipidemia    intol of statins   Hypertension    Kidney stones    NHL (non-Hodgkin's lymphoma) (HCC) dx'd 2003   chemo/xrt comp 2003   Personal history of colonic polyps    Proctitis    Pruritic intertrigo 07/18/2014   Urticaria     OBJECTIVE General Patient is awake, alert, and oriented x 3 and in no acute distress. Derm Skin is dry and supple bilateral. Negative open lesions or macerations. Remaining integument unremarkable. Nails are tender, long, thickened and dystrophic with subungual debris, consistent with onychomycosis, 1-5 bilateral. No signs of  infection noted. Vasc  DP and PT pedal pulses palpable bilaterally. Temperature gradient within normal limits.  Neuro Epicritic and protective threshold sensation diminished bilaterally.  Musculoskeletal Exam No symptomatic pedal deformities noted bilateral. Muscular strength within normal limits.  There is some tenderness with palpation of the first MTP as well as some pain with range of motion as well.  ASSESSMENT 1. Diabetes Mellitus w/ peripheral neuropathy 2.  First MTP capsulitis/hallux limitus bilateral 3.  Recent DVT RLE  PLAN OF CARE 1. Patient evaluated today.  Comprehensive diabetic foot exam performed today 2.  Injection of 0.5 cc Celestone Soluspan injected in the bilateral first MTP 3.  Appointment with orthotics department for diabetic custom molded orthotics.  Order placed.  This should help support the foot and stabilize the patient reducing the risk of fall and reduce potentially similar pain and tenderness in her feet.  Recommend more of a functional type orthotic versus accommodative diabetic insole. 4.  Continue gabapentin nightly as per PCP prescription  5.  Return to clinic 3 months   Felecia Shelling, DPM [facility]  Dr. Felecia Shelling, DPM    [address]  [place], [place] [address]                Office [phone]  Fax [phone]

## 2023-04-18 ENCOUNTER — Ambulatory Visit (HOSPITAL_COMMUNITY): Payer: Medicare PPO

## 2023-04-19 ENCOUNTER — Other Ambulatory Visit: Payer: Self-pay | Admitting: Cardiovascular Disease

## 2023-04-19 ENCOUNTER — Other Ambulatory Visit: Payer: Self-pay | Admitting: *Deleted

## 2023-04-19 DIAGNOSIS — D2271 Melanocytic nevi of right lower limb, including hip: Secondary | ICD-10-CM | POA: Diagnosis not present

## 2023-04-19 DIAGNOSIS — I4891 Unspecified atrial fibrillation: Secondary | ICD-10-CM

## 2023-04-19 DIAGNOSIS — D2272 Melanocytic nevi of left lower limb, including hip: Secondary | ICD-10-CM | POA: Diagnosis not present

## 2023-04-19 DIAGNOSIS — Z85828 Personal history of other malignant neoplasm of skin: Secondary | ICD-10-CM | POA: Diagnosis not present

## 2023-04-19 DIAGNOSIS — D2261 Melanocytic nevi of right upper limb, including shoulder: Secondary | ICD-10-CM | POA: Diagnosis not present

## 2023-04-19 DIAGNOSIS — I2510 Atherosclerotic heart disease of native coronary artery without angina pectoris: Secondary | ICD-10-CM

## 2023-04-19 DIAGNOSIS — D2262 Melanocytic nevi of left upper limb, including shoulder: Secondary | ICD-10-CM | POA: Diagnosis not present

## 2023-04-19 DIAGNOSIS — S50862A Insect bite (nonvenomous) of left forearm, initial encounter: Secondary | ICD-10-CM | POA: Diagnosis not present

## 2023-04-19 DIAGNOSIS — Z08 Encounter for follow-up examination after completed treatment for malignant neoplasm: Secondary | ICD-10-CM | POA: Diagnosis not present

## 2023-04-19 DIAGNOSIS — D225 Melanocytic nevi of trunk: Secondary | ICD-10-CM | POA: Diagnosis not present

## 2023-04-19 MED ORDER — METOPROLOL SUCCINATE ER 100 MG PO TB24: ORAL_TABLET | ORAL | 3 refills | Status: DC

## 2023-04-19 NOTE — Telephone Encounter (Signed)
Pt c/o medication issue:  1. Name of Medication: apixaban (ELIQUIS) 5 MG TABS tablet  nitroGLYCERIN (NITROSTAT) 0.4 MG SL tablet   2. How are you currently taking this medication (dosage and times per day)? N/A  3. Are you having a reaction (difficulty breathing--STAT)? No   4. What is your medication issue? [facility] is calling to follow up with the office due to the request for Eliquis being denied. She is requesting both meds be sent to [facility], not [facility]. Please advise.

## 2023-04-19 NOTE — Telephone Encounter (Signed)
Left message for patient to callback and confirm that she would like prescriptions sent to [facility].

## 2023-04-20 ENCOUNTER — Other Ambulatory Visit: Payer: Self-pay

## 2023-04-20 DIAGNOSIS — I4891 Unspecified atrial fibrillation: Secondary | ICD-10-CM

## 2023-04-21 MED ORDER — NITROGLYCERIN 0.4 MG SL SUBL
0.4000 mg | SUBLINGUAL_TABLET | SUBLINGUAL | 2 refills | Status: DC | PRN
Start: 2023-04-21 — End: 2024-03-05

## 2023-04-21 MED ORDER — APIXABAN 5 MG PO TABS
5.0000 mg | ORAL_TABLET | Freq: Two times a day (BID) | ORAL | 2 refills | Status: DC
Start: 2023-04-21 — End: 2023-06-28

## 2023-04-21 NOTE — Telephone Encounter (Signed)
Pt is requesting Elquis be resent to [facility] order pharmacy. Please resend. Thanks

## 2023-04-21 NOTE — Telephone Encounter (Signed)
Eliquis RX resent to [facility] per request.

## 2023-04-21 NOTE — Telephone Encounter (Signed)
Pt called back, she confirmed she does want these medications sent to [facility] pharmacy.

## 2023-04-27 ENCOUNTER — Telehealth: Payer: Self-pay | Admitting: Pharmacist

## 2023-04-27 DIAGNOSIS — N1832 Chronic kidney disease, stage 3b: Secondary | ICD-10-CM

## 2023-04-27 DIAGNOSIS — I10 Essential (primary) hypertension: Secondary | ICD-10-CM

## 2023-04-27 NOTE — Telephone Encounter (Signed)
PharmD reviewed patient chart to assess eligibility for [facility] services. Patient was determined to be a good candidate for the program given the complexity of the medication regimen and/or overall risk for hospitalization and increased utilization.  Referral entered in order to outreach patient and offer appointment with PharmD. Referral cosigned to PCP.  

## 2023-05-05 ENCOUNTER — Telehealth: Payer: Self-pay | Admitting: Podiatry

## 2023-05-05 MED ORDER — GABAPENTIN 100 MG PO CAPS: 100.0000 mg | ORAL_CAPSULE | Freq: Three times a day (TID) | ORAL | 3 refills | Status: DC

## 2023-05-05 NOTE — Telephone Encounter (Signed)
Pt stated that her mail order pharmacy, [facility] needs a Rx for the Gabapentin in order to be refilled. Please advise

## 2023-05-11 ENCOUNTER — Other Ambulatory Visit: Payer: Self-pay | Admitting: Podiatry

## 2023-05-11 MED ORDER — GABAPENTIN 100 MG PO CAPS: 100.0000 mg | ORAL_CAPSULE | Freq: Three times a day (TID) | ORAL | 3 refills | Status: DC

## 2023-05-13 ENCOUNTER — Encounter: Payer: Medicare PPO | Admitting: Pharmacist

## 2023-05-18 DIAGNOSIS — Z1231 Encounter for screening mammogram for malignant neoplasm of breast: Secondary | ICD-10-CM | POA: Diagnosis not present

## 2023-05-18 LAB — HM MAMMOGRAPHY

## 2023-05-19 ENCOUNTER — Other Ambulatory Visit: Payer: Medicare PPO

## 2023-05-19 ENCOUNTER — Encounter: Payer: Self-pay | Admitting: Primary Care

## 2023-05-19 NOTE — Progress Notes (Signed)
   05/19/2023  Patient ID: [name] [name], female   DOB: [birth year], 89 y.o.   MRN: [id number]  Outreach attempt for scheduled telephone visit was unsuccessful.  Tried to call home phone x2 and mobile x1; left a voicemail on home phone with my direct number to call at her convenience to reschedule visit.  I will also send a MyChart message.  Lenna Gilford, PharmD, DPLA

## 2023-05-20 ENCOUNTER — Ambulatory Visit: Payer: Medicare PPO | Admitting: Podiatry

## 2023-05-23 DIAGNOSIS — H401133 Primary open-angle glaucoma, bilateral, severe stage: Secondary | ICD-10-CM | POA: Diagnosis not present

## 2023-05-23 DIAGNOSIS — E119 Type 2 diabetes mellitus without complications: Secondary | ICD-10-CM | POA: Diagnosis not present

## 2023-05-23 DIAGNOSIS — H43813 Vitreous degeneration, bilateral: Secondary | ICD-10-CM | POA: Diagnosis not present

## 2023-05-23 DIAGNOSIS — H04123 Dry eye syndrome of bilateral lacrimal glands: Secondary | ICD-10-CM | POA: Diagnosis not present

## 2023-05-23 LAB — HM DIABETES EYE EXAM

## 2023-05-24 ENCOUNTER — Telehealth: Payer: Self-pay

## 2023-05-24 NOTE — Telephone Encounter (Signed)
   Pre-operative Risk Assessment    Patient Name: [name] [name]  DOB: [birth year] MRN: [id number]      Request for Surgical Clearance    Procedure:   KAHOOK DUAL BLADE GONIOTOMY  Date of Surgery:  Clearance 06/13/23                                 Surgeon:  DR. Deirdre Evener Surgeon's Group or Practice Name:  [facility] Phone number:  [phone] Fax number:  [phone]   Type of Clearance Requested:   - Pharmacy:  Hold Apixaban (Eliquis) 5 DAYS PRIOR TO PROCEDURE   Type of Anesthesia:  Not Indicated   Additional requests/questions:    SignedMichaelle Copas   05/24/2023, 5:36 PM

## 2023-05-24 NOTE — Progress Notes (Unsigned)
    Raelynn Corron T. Arrietty Dercole, MD, CAQ Sports Medicine [facility] [address] [place], [address]  Phone: [phone]  FAX: [phone]  [name] [name] - 89 y.o. female  MRN [id number]  Date of Birth: 1931-12-10  Date: 05/25/2023  PCP: Doreene Nest, NP  Referral: Doreene Nest, NP  No chief complaint on file.  Subjective:   [name] [name] is a 89 y.o. very pleasant female patient with There is no height or weight on file to calculate BMI. who presents with the following:  She is a very pleasant lady at age 89 who I recall quite well.  I have seen her many times over the years for various musculoskeletal issues.  Last time I saw her was for some knee arthritis, worse on the right side.  She presents today with some ongoing knee pain as well as some ankle pain.  Review of Systems is noted in the HPI, as appropriate  Objective:   There were no vitals taken for this visit.  GEN: No acute distress; alert,appropriate. PULM: Breathing comfortably in no respiratory distress PSYCH: Normally interactive.   Laboratory and Imaging Data:  Assessment and Plan:   ***

## 2023-05-25 ENCOUNTER — Ambulatory Visit: Payer: Medicare PPO | Admitting: Family Medicine

## 2023-05-25 ENCOUNTER — Encounter: Payer: Self-pay | Admitting: Family Medicine

## 2023-05-25 VITALS — BP 118/60 | HR 68 | Temp 97.5°F | Ht 69.0 in | Wt 167.4 lb

## 2023-05-25 DIAGNOSIS — M170 Bilateral primary osteoarthritis of knee: Secondary | ICD-10-CM

## 2023-05-25 DIAGNOSIS — M19071 Primary osteoarthritis, right ankle and foot: Secondary | ICD-10-CM

## 2023-05-25 MED ORDER — PREDNISONE 20 MG PO TABS: ORAL_TABLET | ORAL | 0 refills | Status: DC

## 2023-05-25 MED ORDER — TRIAMCINOLONE ACETONIDE 40 MG/ML IJ SUSP
20.0000 mg | Freq: Once | INTRAMUSCULAR | Status: AC
Start: 2023-05-25 — End: 2023-05-25
  Administered 2023-05-25: 20 mg via INTRA_ARTICULAR

## 2023-05-25 NOTE — Telephone Encounter (Signed)
Patient with diagnosis of age indeterminate right LE DVT 12/20/22 on Eliquis for anticoagulation. 01/28/23 cards note says pt to follow up with primary care for further management of DVT.  Procedure: kahook dual blade goniotomy Date of procedure: 06/13/23  CrCl 2mL/min Platelet count 289K  Procedure should not require 5 day DOAC hold, may have been thinking of warfarin. Procedure is ~6 months after DVT diagnosis. Per protocol, ok to hold Eliquis for 1-2 days prior to procedure.  **This guidance is not considered finalized until pre-operative APP has relayed final recommendations.**

## 2023-05-25 NOTE — Telephone Encounter (Signed)
   Patient Name: [name] [name]  DOB: 1932/05/18 MRN: [id number]  Primary Cardiologist: Verne Carrow, MD  Clinical pharmacists have reviewed the patient's past medical history, labs, and current medications as part of preoperative protocol coverage. The following recommendations have been made:   Procedure should not require 5 day DOAC hold, may have been thinking of warfarin. Procedure is ~6 months after DVT diagnosis. Per protocol, ok to hold Eliquis for 1-2 days prior to procedure.   I will route this recommendation to the requesting party via Epic fax function and remove from pre-op pool.  Please call with questions.  Napoleon Form, Leodis Rains, NP 05/25/2023, 9:15 AM

## 2023-05-25 NOTE — Telephone Encounter (Signed)
Pharmacy please advise on holding Eliquis prior to [facility] DUAL BLADE GONIOTOMY  scheduled for 06/13/2023. Thank you.

## 2023-05-26 ENCOUNTER — Telehealth: Payer: Self-pay

## 2023-05-26 ENCOUNTER — Encounter: Payer: Self-pay | Admitting: Family Medicine

## 2023-05-26 NOTE — Progress Notes (Signed)
   05/26/2023  Patient ID: [name] [name], female   DOB: [birth year], 89 y.o.   MRN: [id number]  Outreach attempt to reschedule missed telephone visit on 6/20.  Unable to make contact, so I am sending a MyChart message to patient/active proxy user to call at their convenience to reschedule.  Lenna Gilford, PharmD, DPLA

## 2023-06-01 ENCOUNTER — Telehealth: Payer: Self-pay

## 2023-06-01 NOTE — Progress Notes (Signed)
   06/01/2023  Patient ID: [name] [name], female   DOB: [birth year], 89 y.o.   MRN: [id number]  Patient outreach to reschedule telephone visit from 6/20; I was able to make contact with [name] [name] and scheduled a telephone visit for 7/17 at 2pm.  Lenna Gilford, PharmD, DPLA

## 2023-06-06 ENCOUNTER — Encounter: Payer: Self-pay | Admitting: Ophthalmology

## 2023-06-07 ENCOUNTER — Encounter: Payer: Self-pay | Admitting: Ophthalmology

## 2023-06-07 NOTE — Anesthesia Preprocedure Evaluation (Addendum)
Anesthesia Evaluation  Patient identified by MRN, date of birth, ID band Patient awake    Reviewed: Allergy & Precautions, H&P , NPO status , Patient's Chart, lab work & pertinent test results  Airway Mallampati: IV  TM Distance: <3 FB Neck ROM: Limited  Mouth opening: Limited Mouth Opening  Dental no notable dental hx. (+) Lower Dentures, Upper Dentures Dentures are quite loose; will need to be removed preop:   Pulmonary neg pulmonary ROS   Pulmonary exam normal breath sounds clear to auscultation       Cardiovascular hypertension, + CAD  Normal cardiovascular exam Rhythm:Irregular Rate:Normal  Frequent PACs, hx stents to coronary arterires  CAD (coronary artery disease)      a. s/p tandem Promus DES to LAD in 2009 by Dr. Juanda Chance;  b.  LHC (03/22/14):  prox LAD 30%, mid LAD 40%, LAD stent ok with dist 20% ISR, apical LAD occluded with L-L collats filling apical vessel (too small for PCI), mid RCA 20%, EF 70%.  Med Rx  04-07-22 1. Left ventricular ejection fraction, by estimation, is 60 to 65%. The  left ventricle has normal function. The left ventricle has no regional  wall motion abnormalities. There is mild concentric left ventricular  hypertrophy of the with severe basal  septal thickness. Left ventricular diastolic parameters are consistent  with Grade I diastolic dysfunction (impaired relaxation).   2. Right ventricular systolic function is normal. The right ventricular  size is normal.   3. Left atrial size was moderately dilated.   4. The pericardial effusion is posterior to the left ventricle. There is  no evidence of cardiac tamponade.   5. The mitral valve is normal in structure. No evidence of mitral valve  regurgitation. No evidence of mitral stenosis.   6. The aortic valve is tricuspid. Aortic valve regurgitation is mild.  Aortic valve sclerosis is present, with no evidence of aortic valve  stenosis.   7. The  inferior vena cava is normal in size with greater than 50%  respiratory variability, suggesting right atrial pressure of 3 mmHg.     Neuro/Psych  PSYCHIATRIC DISORDERS Anxiety Depression    negative neurological ROS  negative psych ROS   GI/Hepatic negative GI ROS, Neg liver ROS,GERD  ,,  Endo/Other  diabetes    Renal/GU Renal diseasenegative Renal ROSStage 3 b CKD  negative genitourinary   Musculoskeletal negative musculoskeletal ROS (+) Arthritis ,    Abdominal   Peds negative pediatric ROS (+)  Hematology negative hematology ROS (+) Blood dyscrasia, anemia   Anesthesia Other Findings GERD (gastroesophageal reflux disease) CAD (coronary artery disease) Urticaria  Hyperlipidemia Hypertension  Allergy Personal history of colonic polyps Diabetes mellitus NHL (non-Hodgkin's lymphoma)  Proctitis Diverticulosis of colon (without mention of hemorrhage)  Pruritic intertrigo Herpes zoster without complication Acute bilateral thoracic back pain Anemia  Arthritis Cancer  Colon polyps Glaucoma  Kidney stones DVT (deep venous thrombosis)  Chronic kidney disease, stage 3b     Reproductive/Obstetrics negative OB ROS                             Anesthesia Physical Anesthesia Plan  ASA: 3  Anesthesia Plan: MAC   Post-op Pain Management:    Induction: Intravenous  PONV Risk Score and Plan:   Airway Management Planned: Natural Airway and Nasal Cannula  Additional Equipment:   Intra-op Plan:   Post-operative Plan:   Informed Consent: I have reviewed the patients History and Physical,  chart, labs and discussed the procedure including the risks, benefits and alternatives for the proposed anesthesia with the patient or authorized representative who has indicated his/her understanding and acceptance.     Dental Advisory Given  Plan Discussed with: Anesthesiologist, CRNA and Surgeon  Anesthesia Plan Comments: (Patient consented for  risks of anesthesia including but not limited to:  - adverse reactions to medications - damage to eyes, teeth, lips or other oral mucosa - nerve damage due to positioning  - sore throat or hoarseness - Damage to heart, brain, nerves, lungs, other parts of body or loss of life  Patient voiced understanding.)        Anesthesia Quick Evaluation

## 2023-06-09 ENCOUNTER — Other Ambulatory Visit: Payer: Self-pay | Admitting: *Deleted

## 2023-06-09 MED ORDER — ISOSORBIDE MONONITRATE ER 60 MG PO TB24: 90.0000 mg | ORAL_TABLET | Freq: Every day | ORAL | 2 refills | Status: DC

## 2023-06-09 NOTE — Discharge Instructions (Signed)

## 2023-06-13 ENCOUNTER — Ambulatory Visit: Payer: Medicare PPO | Admitting: Anesthesiology

## 2023-06-13 ENCOUNTER — Ambulatory Visit
Admission: RE | Admit: 2023-06-13 | Discharge: 2023-06-13 | Disposition: A | Discharge status: Home / Self Care | Payer: Medicare PPO | Attending: Ophthalmology | Admitting: Ophthalmology

## 2023-06-13 ENCOUNTER — Other Ambulatory Visit: Payer: Self-pay

## 2023-06-13 ENCOUNTER — Encounter
Admission: RE | Disposition: A | Discharge status: Home / Self Care | Payer: Self-pay | Source: Home / Self Care | Attending: Ophthalmology

## 2023-06-13 ENCOUNTER — Encounter: Payer: Self-pay | Admitting: Ophthalmology

## 2023-06-13 DIAGNOSIS — Z955 Presence of coronary angioplasty implant and graft: Secondary | ICD-10-CM | POA: Insufficient documentation

## 2023-06-13 DIAGNOSIS — I351 Nonrheumatic aortic (valve) insufficiency: Secondary | ICD-10-CM | POA: Insufficient documentation

## 2023-06-13 DIAGNOSIS — I129 Hypertensive chronic kidney disease with stage 1 through stage 4 chronic kidney disease, or unspecified chronic kidney disease: Secondary | ICD-10-CM | POA: Insufficient documentation

## 2023-06-13 DIAGNOSIS — E785 Hyperlipidemia, unspecified: Secondary | ICD-10-CM | POA: Insufficient documentation

## 2023-06-13 DIAGNOSIS — F32A Depression, unspecified: Secondary | ICD-10-CM | POA: Diagnosis not present

## 2023-06-13 DIAGNOSIS — E1122 Type 2 diabetes mellitus with diabetic chronic kidney disease: Secondary | ICD-10-CM | POA: Insufficient documentation

## 2023-06-13 DIAGNOSIS — H401123 Primary open-angle glaucoma, left eye, severe stage: Secondary | ICD-10-CM | POA: Insufficient documentation

## 2023-06-13 DIAGNOSIS — Z7901 Long term (current) use of anticoagulants: Secondary | ICD-10-CM | POA: Diagnosis not present

## 2023-06-13 DIAGNOSIS — K219 Gastro-esophageal reflux disease without esophagitis: Secondary | ICD-10-CM | POA: Insufficient documentation

## 2023-06-13 DIAGNOSIS — Z86718 Personal history of other venous thrombosis and embolism: Secondary | ICD-10-CM | POA: Insufficient documentation

## 2023-06-13 DIAGNOSIS — F419 Anxiety disorder, unspecified: Secondary | ICD-10-CM | POA: Diagnosis not present

## 2023-06-13 DIAGNOSIS — Z79899 Other long term (current) drug therapy: Secondary | ICD-10-CM | POA: Diagnosis not present

## 2023-06-13 DIAGNOSIS — N1832 Chronic kidney disease, stage 3b: Secondary | ICD-10-CM | POA: Diagnosis not present

## 2023-06-13 DIAGNOSIS — I2510 Atherosclerotic heart disease of native coronary artery without angina pectoris: Secondary | ICD-10-CM | POA: Insufficient documentation

## 2023-06-13 DIAGNOSIS — Z7984 Long term (current) use of oral hypoglycemic drugs: Secondary | ICD-10-CM | POA: Insufficient documentation

## 2023-06-13 HISTORY — DX: Cardiomegaly: I51.7

## 2023-06-13 HISTORY — DX: Nonrheumatic aortic (valve) insufficiency: I35.1

## 2023-06-13 HISTORY — DX: Other ill-defined heart diseases: I51.89

## 2023-06-13 LAB — GLUCOSE, CAPILLARY: Glucose-Capillary: 192 mg/dL — ABNORMAL HIGH (ref 70–99)

## 2023-06-13 SURGERY — GONIOTOMY
Anesthesia: Monitor Anesthesia Care | Site: Eye | Laterality: Left

## 2023-06-13 MED ORDER — FENTANYL CITRATE (PF) 100 MCG/2ML IJ SOLN
INTRAMUSCULAR | Status: DC | PRN
  Administered 2023-06-13: 50 ug via INTRAVENOUS

## 2023-06-13 MED ORDER — LACTATED RINGERS IV SOLN: INTRAVENOUS | Status: DC

## 2023-06-13 MED ORDER — SIGHTPATH DOSE#1 NA CHONDROIT SULF-NA HYALURON 20-15 MG/0.5ML IO SOSY
INTRAOCULAR | Status: DC | PRN
  Administered 2023-06-13: .5 mL via INTRAOCULAR

## 2023-06-13 MED ORDER — BRIMONIDINE TARTRATE-TIMOLOL 0.2-0.5 % OP SOLN
OPHTHALMIC | Status: DC | PRN
  Administered 2023-06-13: 1 [drp] via OPHTHALMIC

## 2023-06-13 MED ORDER — TETRACAINE HCL 0.5 % OP SOLN
1.0000 [drp] | OPHTHALMIC | Status: DC | PRN
  Administered 2023-06-13: 1 [drp] via OPHTHALMIC

## 2023-06-13 MED ORDER — SIGHTPATH DOSE#1 BSS IO SOLN
INTRAOCULAR | Status: DC | PRN
  Administered 2023-06-13: 15 mL via INTRAOCULAR
  Administered 2023-06-13: 500 mL via INTRAOCULAR

## 2023-06-13 MED ORDER — ONDANSETRON HCL 4 MG/2ML IJ SOLN
INTRAMUSCULAR | Status: DC | PRN
  Administered 2023-06-13: 4 mg via INTRAVENOUS

## 2023-06-13 MED ORDER — SIGHTPATH DOSE#1 BSS IO SOLN
INTRAOCULAR | Status: DC | PRN
  Administered 2023-06-13: 2 mL

## 2023-06-13 MED ORDER — NA CHONDROIT SULF-NA HYALURON 40-30 MG/ML IO SOSY
INTRAOCULAR | Status: DC | PRN
  Administered 2023-06-13: .5 mL via INTRAOCULAR

## 2023-06-13 MED ORDER — CEFUROXIME OPHTHALMIC INJECTION 1 MG/0.1 ML
INJECTION | OPHTHALMIC | Status: DC | PRN
  Administered 2023-06-13: .1 mL via SUBCONJUNCTIVAL

## 2023-06-13 SURGICAL SUPPLY — 10 items
BLADE DUAL KAHOOK SINGLE USE (BLADE) IMPLANT
CATARACT SUITE SIGHTPATH (MISCELLANEOUS) ×1 IMPLANT
FEE CATARACT SUITE SIGHTPATH (MISCELLANEOUS) ×1 IMPLANT
GLOVE SRG 8 PF TXTR STRL LF DI (GLOVE) ×1 IMPLANT
GLOVE SURG ENC TEXT LTX SZ7.5 (GLOVE) ×1 IMPLANT
GLOVE SURG UNDER POLY LF SZ8 (GLOVE) ×1
ICLIP (OPHTHALMIC RELATED) IMPLANT
NDL FILTER BLUNT 18X1 1/2 (NEEDLE) ×1 IMPLANT
NEEDLE FILTER BLUNT 18X1 1/2 (NEEDLE) ×1 IMPLANT
SYR 3ML LL SCALE MARK (SYRINGE) ×1 IMPLANT

## 2023-06-13 NOTE — Transfer of Care (Signed)
Immediate Anesthesia Transfer of Care Note  Patient: [name] [name]  Procedure(s) Performed: Milon Dikes DUAL BLADE GONIOTOMY LEFT DIABETIC 0.67 00:038 (Left: Eye)  Patient Location: PACU  Anesthesia Type: MAC  Level of Consciousness: awake, alert  and patient cooperative  Airway and Oxygen Therapy: Patient Spontanous Breathing and Patient connected to supplemental oxygen  Post-op Assessment: Post-op Vital signs reviewed, Patient's Cardiovascular Status Stable, Respiratory Function Stable, Patent Airway and No signs of Nausea or vomiting  Post-op Vital Signs: Reviewed and stable  Complications: No notable events documented.

## 2023-06-13 NOTE — H&P (Signed)
[facility]   Primary Care Physician:  Doreene Nest, NP Ophthalmologist: Dr. Lockie Mola  Pre-Procedure History & Physical: HPI:  [name] [name] is a 89 y.o. female here for ophthalmic surgery.   Past Medical History:  Diagnosis Date   Acute bilateral thoracic back pain 08/16/2019   Allergy    Cipro, Plavix, Statins   Anemia    Arthritis    CAD (coronary artery disease)    a. s/p tandem Promus DES to LAD in 2009 by Dr. Juanda Chance;  b.  LHC (03/22/14):  prox LAD 30%, mid LAD 40%, LAD stent ok with dist 20% ISR, apical LAD occluded with L-L collats filling apical vessel (too small for PCI), mid RCA 20%, EF 70%.  Med Rx   Cancer [facility])    Chronic kidney disease, stage 3b (HCC)    Colon polyps    Diabetes mellitus    Diagnosed 2012   Diverticulosis of colon (without mention of hemorrhage)    DVT (deep venous thrombosis) (HCC)    GERD (gastroesophageal reflux disease)    Glaucoma    Grade I diastolic dysfunction    Herpes zoster without complication 04/24/2020   Hyperlipidemia    intol of statins   Hypertension    Kidney stones    Left atrial dilation    Moderate aortic regurgitation    NHL (non-Hodgkin's lymphoma) (HCC) dx'd 2003   chemo/xrt comp 2003   Personal history of colonic polyps    Proctitis    Pruritic intertrigo 07/18/2014   Urticaria     Past Surgical History:  Procedure Laterality Date   cardiac cath-neg     CATARACT EXTRACTION Bilateral    CORONARY ANGIOPLASTY WITH STENT PLACEMENT     x 2   dexa-neg     KNEE SURGERY  10/30/2003   left   LEFT HEART CATHETERIZATION WITH CORONARY ANGIOGRAM N/A 03/22/2014   Procedure: LEFT HEART CATHETERIZATION WITH CORONARY ANGIOGRAM;  Surgeon: Kathleene Hazel, MD;  Location: [facility] CATH LAB;  Service: Cardiovascular;  Laterality: N/A;   stress cardiolite     VAGINAL HYSTERECTOMY     partial, fibroids, one ovary left    Prior to Admission medications   Medication Sig Start Date End Date Taking?  Authorizing Provider  apixaban (ELIQUIS) 5 MG TABS tablet Take 1 tablet (5 mg total) by mouth 2 (two) times daily. 04/21/23  Yes Kathleene Hazel, MD  Bempedoic Acid-Ezetimibe (NEXLIZET) 180-10 MG TABS Take 1 tablet by mouth daily. 04/13/23  Yes Kathleene Hazel, MD  Blood Glucose Monitoring Suppl DEVI 1 each by Does not apply route in the morning, at noon, and at bedtime. May substitute to any manufacturer covered by patient's insurance. 02/16/23  Yes Doreene Nest, NP  brimonidine-timolol (COMBIGAN) 0.2-0.5 % ophthalmic solution Place 1 drop into both eyes every 12 (twelve) hours.   Yes [provider]  Cholecalciferol (VITAMIN D-3) 1000 UNITS CAPS Take 1 capsule by mouth daily.   Yes [provider]  diclofenac Sodium (VOLTAREN) 1 % GEL APPLY 2 GRAMS TOPICALLY THREE TIMES A DAY AS NEEDED 02/05/23  Yes Doreene Nest, NP  dorzolamide-timolol (COSOPT) 2-0.5 % ophthalmic solution SMARTSIG:In Eye(s) 05/09/23  Yes [provider]  ferrous sulfate 325 (65 FE) MG tablet Take 1 tablet (325 mg total) by mouth daily with breakfast. 10/28/22  Yes Meredith Pel, NP  fexofenadine (ALLEGRA) 180 MG tablet Take 1 tablet (180 mg total) by mouth as needed for allergies or rhinitis. 04/03/19  Yes Doreene Nest, NP  gabapentin (NEURONTIN) 100 MG capsule Take 1 capsule (100 mg total) by mouth 3 (three) times daily. 05/11/23  Yes Felecia Shelling, DPM  glipiZIDE (GLUCOTROL XL) 10 MG 24 hr tablet Take 1 tablet (10 mg total) by mouth daily with breakfast. for diabetes. 04/13/23  Yes Doreene Nest, NP  Glucose Blood (BLOOD GLUCOSE TEST STRIPS) STRP 1 each by In Vitro route in the morning, at noon, and at bedtime. May substitute to any manufacturer covered by patient's insurance. 02/16/23  Yes Doreene Nest, NP  isosorbide mononitrate (IMDUR) 60 MG 24 hr tablet Take 1.5 tablets (90 mg total) by mouth daily. 06/09/23  Yes Kathleene Hazel, MD  Lancets Misc. MISC 1  each by Does not apply route in the morning, at noon, and at bedtime. May substitute to any manufacturer covered by patient's insurance. 02/16/23  Yes Doreene Nest, NP  metoprolol succinate (TOPROL-XL) 100 MG 24 hr tablet Take one (1) tablet by mouth (100 mg) daily. Take with or immediately following a meal. 04/19/23  Yes McAlhany, Nile Dear, MD  nitroGLYCERIN (NITROSTAT) 0.4 MG SL tablet Place 1 tablet (0.4 mg total) under the tongue every 5 (five) minutes as needed for chest pain. 04/21/23  Yes Kathleene Hazel, MD  pantoprazole (PROTONIX) 20 MG tablet TAKE 1 TABLET BY MOUTH DAILY FOR HEARTBURN 04/13/23  Yes Doreene Nest, NP  ROCKLATAN 0.02-0.005 % SOLN Apply to eye. 04/16/23  Yes [provider]  sertraline (ZOLOFT) 100 MG tablet TAKE 1 TABLET BY MOUTH DAILY FOR ANXIETY AND DEPRESSION 10/13/22  Yes Doreene Nest, NP  vitamin B-12 (CYANOCOBALAMIN) 1000 MCG tablet Take 1,000 mcg by mouth daily.   Yes [provider]  gabapentin (NEURONTIN) 100 MG capsule TAKE ONE CAPSULE BY MOUTH THREE TIMES A DAY Patient taking differently: Take 100 mg by mouth. Taking 1 tablet every morning, 2 tablets every evening 03/08/23   Felecia Shelling, DPM  predniSONE (DELTASONE) 20 MG tablet 2 tabs po daily for 5 days, then 1 tab po daily for 5 days Patient not taking: Reported on 06/06/2023 05/25/23   Copland, Karleen Hampshire, MD  loratadine (CLARITIN) 10 MG tablet Take 10 mg by mouth as needed.   02/15/12  [provider]    Allergies as of 05/26/2023 - Review Complete 05/26/2023  Allergen Reaction Noted   Other Rash and Anaphylaxis 03/04/2015   Shellfish allergy Anaphylaxis and Rash 12/10/2011   Cephalexin  01/10/2008   Ciprofloxacin     Clopidogrel bisulfate     Codeine phosphate  01/24/2007   Ezetimibe-simvastatin  01/24/2007   Hydrocod poli-chlorphe poli er     Lidocaine Hives 11/13/2015   Nitrofurantoin     Pregabalin     Propoxyphene n-acetaminophen     Rofecoxib      Sulfamethoxazole  01/24/2007   Sulfonamide derivatives  01/24/2007   Celecoxib Rash 02/17/2015   Codeine Rash 02/17/2015   Hydrocod poli-chlorphe poli er Rash 03/04/2015   Propoxyphene Rash 03/04/2015   Sulfa antibiotics Rash 02/17/2015    Family History  Problem Relation Age of Onset   Cancer Mother        jaw   Hypertension Father    Heart attack Father    Heart attack Sister    Cancer Sister        unknown primary-mets throughout body   Colon cancer Neg Hx    Esophageal cancer Neg Hx    Pancreatic cancer Neg Hx  Stomach cancer Neg Hx    Liver disease Neg Hx     Social History   Socioeconomic History   Marital status: Widowed    Spouse name: Not on file   Number of children: 0   Years of education: Not on file   Highest education level: 12th grade  Occupational History   Occupation: Retired    Associate Professor: RETIRED   Occupation: retired  Tobacco Use   Smoking status: Never   Smokeless tobacco: Never  Vaping Use   Vaping status: Never Used  Substance and Sexual Activity   Alcohol use: No   Drug use: No   Sexual activity: Not Currently  Other Topics Concern   Not on file  Social History Narrative   Not on file   Social Determinants of Health   Financial Resource Strain: Low Risk  (02/16/2023)   Overall Financial Resource Strain (CARDIA)    Difficulty of Paying Living Expenses: Not hard at all  Food Insecurity: No Food Insecurity (02/16/2023)   Hunger Vital Sign    Worried About Running Out of Food in the Last Year: Never true    Ran Out of Food in the Last Year: Never true  Transportation Needs: No Transportation Needs (02/16/2023)   PRAPARE - Administrator, Civil Service (Medical): No    Lack of Transportation (Non-Medical): No  Physical Activity: Inactive (06/25/2022)   Exercise Vital Sign    Days of Exercise per Week: 0 days    Minutes of Exercise per Session: 0 min  Stress: No Stress Concern Present (06/25/2022)   [employer] of  Occupational Health - Occupational Stress Questionnaire    Feeling of Stress : Not at all  Social Connections: Moderately Integrated (02/16/2023)   Social Connection and Isolation Panel [NHANES]    Frequency of Communication with Friends and Family: More than three times a week    Frequency of Social Gatherings with Friends and Family: Once a week    Attends Religious Services: More than 4 times per year    Active Member of [employer] or Organizations: Yes    Attends Banker Meetings: More than 4 times per year    Marital Status: Widowed  Intimate Partner Violence: Not At Risk (06/23/2021)   Humiliation, Afraid, Rape, and Kick questionnaire    Fear of Current or Ex-Partner: No    Emotionally Abused: No    Physically Abused: No    Sexually Abused: No    Review of Systems: See HPI, otherwise negative ROS  Physical Exam: There were no vitals taken for this visit. General:   Alert,  pleasant and cooperative in NAD Head:  Normocephalic and atraumatic. Lungs:  Clear to auscultation.    Heart:  Regular rate and rhythm.   Impression/Plan: [name] [name] is here for ophthalmic surgery.  Risks, benefits, limitations, and alternatives regarding ophthalmic surgery have been reviewed with the patient.  Questions have been answered.  All parties agreeable.   Lockie Mola, MD  06/13/2023, 10:09 AM

## 2023-06-13 NOTE — Anesthesia Postprocedure Evaluation (Signed)
Anesthesia Post Note  Patient: [name] [name]  Procedure(s) Performed: Milon Dikes DUAL BLADE GONIOTOMY LEFT DIABETIC 0.67 00:038 (Left: Eye)  Anesthesia Type: MAC Anesthetic complications: no   No notable events documented.   Last Vitals:  Vitals:   06/13/23 1111 06/13/23 1115  BP: (!) 144/86 122/79  Pulse: 71 87  Resp: 18 14  Temp: (!) 36.4 C (!) 36.2 C  SpO2: 99% 93%    Last Pain:  Vitals:   06/13/23 1115  TempSrc:   PainSc: 0-No pain                 Gloria Lambertson C Matteson Blue

## 2023-06-13 NOTE — Op Note (Signed)
PREOPERATIVE DIAGNOSIS:  severe stage Primary Open Angle Glaucoma left eye R60.4540  POSTOPERATIVE DIAGNOSIS:    severe stage Primary Open Angle Glaucoma left eye J81.1914  PROCEDURE: Kahook Dual Blade goniotomy left eye  Ultrasound time: Procedure(s): KAHOOK DUAL BLADE GONIOTOMY LEFT DIABETIC 0.67 00:038 (Left)  SURGEON:  Deirdre Evener, MD   ANESTHESIA:  Topical with tetracaine drops augmented with 1% preservative-free intracameral lidocaine.    COMPLICATIONS:  None.   DESCRIPTION OF PROCEDURE:  The patient was identified in the holding room and transported to the operating room and placed in the supine position under the operating microscope.  The left eye was identified as the operative eye and it was prepped and draped in the usual sterile ophthalmic fashion.   A 1 millimeter clear-corneal paracentesis was made at the 5:30 position.  0.5 ml of preservative-free 1% lidocaine was injected into the anterior chamber.  The anterior chamber was filled with Viscoat viscoelastic.  A 2.4 millimeter keratome was used to make a near-clear corneal incision at the 2:30 position. The microscope was adjusted and a gonioprism was used to visulaize the trabecular meshwork.  The [facility] Dual Blade was advanced across the anterior chamber under viscoelastic.  The blade was used to mark the trabecular meshwork at the 7:30 position.  The blade was placed two clock hours clockwise into the meshwork.  Proper postioning was confirmed.  The blade ws passed counterclockwise through the meshwork to excise approximately two to three clock-hours of trabecular meshwork.   The remaining viscoelastic was aspirated.  Some vitreous was noted to have come around the superior IOL optic.  This was excised with intraocular scissors.   Wounds were hydrated with balanced salt solution.  The anterior chamber was inflated to a physiologic pressure with balanced salt solution.  No wound leaks were noted. Cefuroxime 0.1 ml of  a 10mg /ml solution was injected into the anterior chamber for a dose of 1 mg of intracameral antibiotic at the completion of the case.  The patient was taken to the recovery room in stable condition without complications of anesthesia or surgery.

## 2023-06-15 ENCOUNTER — Other Ambulatory Visit: Payer: Medicare PPO

## 2023-06-15 NOTE — Progress Notes (Signed)
   06/15/2023  Patient ID: Sammuel Cooper, female   DOB: 1932/01/25, 89 y.o.   MRN: [id number]  S/O Telephone visit to discussion medication management specific to chronic disease states: T2DM, HTN, and HLD  HTN -Current Medications:  isosorbide MN ER 90mg  daily, metoprolol XL 100mg  daily -Recent clinic BP 7/15 122/79 -Patient does not endorse any s/sx of hypo or hypertension  DM -Current medications:  glipizide 10mg  daily with breakfast -Patient checks home blood glucose daily.  States she was recently prescribed CGM, but her insurance would not cover this. -A1c 6.4 on 3/20  -Home BG 190-200 after eating -Patient states she sill feel hypoglycemic if does not eat but states this does not happen often -She endorses always eats after glipizide  HLD -Current medications:  Nexzilet 180-10mg  daily -Lipid panel 02/01/22:  TC- 123, LDL 54, HDL-33, TG- 226 -Patient has PAP  for Nexzilet  A/P  HTN -Currently controlled -Continue current regimen and regular follow-up with providers  DM -Currently controlled with A1c <7.0 -Sent prescription to [facility] for CGM under [facility] standing order to see if Medicare B will pay for this, and they will not since patient is not insulin dependent and has no history of severe hypoglycemia -Continue current medication regimen and regular follow-up with providers  HLD -Controlled with LDL <70 -Continue current regimen and regular follow-up with providers  Follow-up:  Sending patient MyChart message to inform her CGM is not covered on medical side of Medicare, either.  I will also provide my direct number in case any needs arise around medications.  Lenna Gilford, PharmD, DPLA

## 2023-06-22 ENCOUNTER — Encounter: Payer: Self-pay | Admitting: Primary Care

## 2023-06-22 ENCOUNTER — Ambulatory Visit: Payer: Medicare PPO | Admitting: Primary Care

## 2023-06-22 DIAGNOSIS — N3942 Incontinence without sensory awareness: Secondary | ICD-10-CM

## 2023-06-22 LAB — POC URINALSYSI DIPSTICK (AUTOMATED)
Bilirubin, UA: NEGATIVE
Blood, UA: POSITIVE
Glucose, UA: NEGATIVE
Ketones, UA: NEGATIVE
Nitrite, UA: NEGATIVE
Protein, UA: NEGATIVE
Spec Grav, UA: 1.025 (ref 1.010–1.025)
Urobilinogen, UA: 0.2 E.U./dL
pH, UA: 6 (ref 5.0–8.0)

## 2023-06-22 MED ORDER — MIRABEGRON ER 25 MG PO TB24
25.0000 mg | ORAL_TABLET | Freq: Every day | ORAL | 0 refills | Status: DC
Start: 2023-06-22 — End: 2023-08-24

## 2023-06-22 NOTE — Assessment & Plan Note (Addendum)
Uncontrolled.  Doesn't seem that oxybutynin was effective, at least at the XL 5 mg dose.  Start Myrbetriq 25 mg daily. Consider oxybutynin XL 5 mg with plan to increase to 10 mg after 1 week if Myrbetriq isn't covered.   We did discussed timed toileting every hour while awake. Consider urology referral.  UA today with 1+ leuks and 2+ blood. Culture ordered and pending. We do not have enough urine for microscopic.   Await results.

## 2023-06-22 NOTE — Patient Instructions (Addendum)
Start Myrbetriq 25 mg daily for overactive bladder.  Please notify me if this is not covered by your insurance.  We will be in touch in a few days once you receive your urine culture results.  It was a pleasure to see you today!

## 2023-06-22 NOTE — Progress Notes (Signed)
Subjective:    Patient ID: [name] [name], female    DOB: 1932/11/22, 89 y.o.   MRN: [id number]  HPI  [name] [name] is a very pleasant 89 y.o. female with a history of hypertension, CAD, type 2 diabetes, cystitis, hyperlipidemia, chronic fatigue, hysterectomy, insomnia, anxiety depression, urinary incontinence who presents today to discuss urinary incontinence.  Chronic for years, increased over the last 1 year. She will lose control of her bladder without a sensation to use the bathroom. Examples include loss of control when she gets up from a seated position to move, "urine just runs out".  This does not happen every time.  She denies foul smelling urine, hematuria, dysuria, pelvic pressure, increased lower back pain, numbness to bilateral lower extremities, loss of bowel control.  Her back is feeling better than it has previously.  She's been taking cranberry tablets daily for the last 1 year.   She was prescribed oxybutynin XL 5 mg daily last year, isn't sure if the medication was effective.    Review of Systems  Constitutional:  Negative for fever.  Gastrointestinal:  Negative for abdominal pain.  Genitourinary:  Positive for urgency. Negative for dysuria, frequency and pelvic pain.       Urinary incontinence   Musculoskeletal:  Negative for back pain.  Neurological:  Negative for numbness.         Past Medical History:  Diagnosis Date   Acute bilateral thoracic back pain 08/16/2019   Allergy    Cipro, Plavix, Statins   Anemia    Arthritis    CAD (coronary artery disease)    a. s/p tandem Promus DES to LAD in 2009 by Dr. Juanda Chance;  b.  LHC (03/22/14):  prox LAD 30%, mid LAD 40%, LAD stent ok with dist 20% ISR, apical LAD occluded with L-L collats filling apical vessel (too small for PCI), mid RCA 20%, EF 70%.  Med Rx   Cancer [facility])    Chronic kidney disease, stage 3b (HCC)    Colon polyps    Diabetes mellitus    Diagnosed 2012   Diverticulosis of colon (without mention of  hemorrhage)    DVT (deep venous thrombosis) (HCC)    GERD (gastroesophageal reflux disease)    Glaucoma    Grade I diastolic dysfunction    Grade I diastolic dysfunction    Herpes zoster without complication 04/24/2020   Hyperlipidemia    intol of statins   Hypertension    Kidney stones    Left atrial dilation    Moderate aortic regurgitation    NHL (non-Hodgkin's lymphoma) (HCC) dx'd 2003   chemo/xrt comp 2003   Personal history of colonic polyps    Proctitis    Pruritic intertrigo 07/18/2014   Urticaria     Social History   Socioeconomic History   Marital status: Widowed    Spouse name: Not on file   Number of children: 0   Years of education: Not on file   Highest education level: 12th grade  Occupational History   Occupation: Retired    Associate Professor: RETIRED   Occupation: retired  Tobacco Use   Smoking status: Never   Smokeless tobacco: Never  Vaping Use   Vaping status: Never Used  Substance and Sexual Activity   Alcohol use: No   Drug use: No   Sexual activity: Not Currently  Other Topics Concern   Not on file  Social History Narrative   Not on file   Social Determinants of Health  Financial Resource Strain: Low Risk  (02/16/2023)   Overall Financial Resource Strain (CARDIA)    Difficulty of Paying Living Expenses: Not hard at all  Food Insecurity: No Food Insecurity (02/16/2023)   Hunger Vital Sign    Worried About Running Out of Food in the Last Year: Never true    Ran Out of Food in the Last Year: Never true  Transportation Needs: No Transportation Needs (02/16/2023)   PRAPARE - Administrator, Civil Service (Medical): No    Lack of Transportation (Non-Medical): No  Physical Activity: Inactive (06/25/2022)   Exercise Vital Sign    Days of Exercise per Week: 0 days    Minutes of Exercise per Session: 0 min  Stress: No Stress Concern Present (06/25/2022)   [employer] of Occupational Health - Occupational Stress Questionnaire     Feeling of Stress : Not at all  Social Connections: Moderately Integrated (02/16/2023)   Social Connection and Isolation Panel [NHANES]    Frequency of Communication with Friends and Family: More than three times a week    Frequency of Social Gatherings with Friends and Family: Once a week    Attends Religious Services: More than 4 times per year    Active Member of [employer] or Organizations: Yes    Attends Banker Meetings: More than 4 times per year    Marital Status: Widowed  Intimate Partner Violence: Not At Risk (06/23/2021)   Humiliation, Afraid, Rape, and Kick questionnaire    Fear of Current or Ex-Partner: No    Emotionally Abused: No    Physically Abused: No    Sexually Abused: No    Past Surgical History:  Procedure Laterality Date   cardiac cath-neg     CATARACT EXTRACTION Bilateral    CORONARY ANGIOPLASTY WITH STENT PLACEMENT     x 2   dexa-neg     KNEE SURGERY  10/30/2003   left   LEFT HEART CATHETERIZATION WITH CORONARY ANGIOGRAM N/A 03/22/2014   Procedure: LEFT HEART CATHETERIZATION WITH CORONARY ANGIOGRAM;  Surgeon: Kathleene Hazel, MD;  Location: [facility] CATH LAB;  Service: Cardiovascular;  Laterality: N/A;   stress cardiolite     VAGINAL HYSTERECTOMY     partial, fibroids, one ovary left    Family History  Problem Relation Age of Onset   Cancer Mother        jaw   Hypertension Father    Heart attack Father    Heart attack Sister    Cancer Sister        unknown primary-mets throughout body   Colon cancer Neg Hx    Esophageal cancer Neg Hx    Pancreatic cancer Neg Hx    Stomach cancer Neg Hx    Liver disease Neg Hx     Allergies  Allergen Reactions   Other Rash and Anaphylaxis   Shellfish Allergy Anaphylaxis and Rash   Cephalexin     REACTION: trash and swelling REACTION: trash and swelling   Ciprofloxacin     REACTION: u/k   Clopidogrel Bisulfate     REACTION: swelling, rash   Codeine Phosphate     REACTION: unspecified    Ezetimibe-Simvastatin     REACTION: myalgias REACTION: myalgias   Hydrocod Poli-Chlorphe Poli Er     REACTION: u/k   Lidocaine Hives    ONLY TO ADHESIVE LIDOCAINE PATCHES. NO PROBLEM WITH INJECTABLE LIDOCAINE.   Nitrofurantoin     REACTION: [place] REACTION: [place]   Pregabalin  REACTION: felt bad REACTION: felt bad   Propoxyphene N-Acetaminophen     REACTION: [place]   Rofecoxib     unk unk    Sulfamethoxazole     REACTION: unspecified   Sulfonamide Derivatives     REACTION: unspecified   Celecoxib Rash    REACTION: [place] REACTION: [place]   Codeine Rash    REACTION: [place] REACTION: [place]   Hydrocod Poli-Chlorphe Poli Er Rash   Propoxyphene Rash   Sulfa Antibiotics Rash    REACTION: unspecified REACTION: unspecified    Current Outpatient Medications on File Prior to Visit  Medication Sig Dispense Refill   apixaban (ELIQUIS) 5 MG TABS tablet Take 1 tablet (5 mg total) by mouth 2 (two) times daily. 60 tablet 2   Bempedoic Acid-Ezetimibe (NEXLIZET) 180-10 MG TABS Take 1 tablet by mouth daily. 90 tablet 3   Blood Glucose Monitoring Suppl DEVI 1 each by Does not apply route in the morning, at noon, and at bedtime. May substitute to any manufacturer covered by patient's insurance. 1 each 0   brimonidine-timolol (COMBIGAN) 0.2-0.5 % ophthalmic solution Place 1 drop into both eyes every 12 (twelve) hours.     Cholecalciferol (VITAMIN D-3) 1000 UNITS CAPS Take 1 capsule by mouth daily.     diclofenac Sodium (VOLTAREN) 1 % GEL APPLY 2 GRAMS TOPICALLY THREE TIMES A DAY AS NEEDED 100 g 2   dorzolamide-timolol (COSOPT) 2-0.5 % ophthalmic solution SMARTSIG:In Eye(s)     ferrous sulfate 325 (65 FE) MG tablet Take 1 tablet (325 mg total) by mouth daily with breakfast. 30 tablet 2   fexofenadine (ALLEGRA) 180 MG tablet Take 1 tablet (180 mg total) by mouth as needed for allergies or rhinitis. 90 tablet 0   gabapentin (NEURONTIN) 100 MG capsule Take 1 capsule (100 mg total) by mouth 3 (three) times daily. 90  capsule 3   glipiZIDE (GLUCOTROL XL) 10 MG 24 hr tablet Take 1 tablet (10 mg total) by mouth daily with breakfast. for diabetes. 90 tablet 1   Glucose Blood (BLOOD GLUCOSE TEST STRIPS) STRP 1 each by In Vitro route in the morning, at noon, and at bedtime. May substitute to any manufacturer covered by patient's insurance. 300 strip 5   isosorbide mononitrate (IMDUR) 60 MG 24 hr tablet Take 1.5 tablets (90 mg total) by mouth daily. 135 tablet 2   Lancets Misc. MISC 1 each by Does not apply route in the morning, at noon, and at bedtime. May substitute to any manufacturer covered by patient's insurance. 300 each 5   metoprolol succinate (TOPROL-XL) 100 MG 24 hr tablet Take one (1) tablet by mouth (100 mg) daily. Take with or immediately following a meal. 90 tablet 3   nitroGLYCERIN (NITROSTAT) 0.4 MG SL tablet Place 1 tablet (0.4 mg total) under the tongue every 5 (five) minutes as needed for chest pain. 75 tablet 2   pantoprazole (PROTONIX) 20 MG tablet TAKE 1 TABLET BY MOUTH DAILY FOR HEARTBURN 90 tablet 1   ROCKLATAN 0.02-0.005 % SOLN Apply to eye.     sertraline (ZOLOFT) 100 MG tablet TAKE 1 TABLET BY MOUTH DAILY FOR ANXIETY AND DEPRESSION 90 tablet 2   vitamin B-12 (CYANOCOBALAMIN) 1000 MCG tablet Take 1,000 mcg by mouth daily.     [DISCONTINUED] loratadine (CLARITIN) 10 MG tablet Take 10 mg by mouth as needed.      No current facility-administered medications on file prior to visit.    BP (!) 142/80   Pulse 76   Temp 97.9  F (36.6 C) (Temporal)   Ht 5' 9.02" (1.753 m)   Wt 167 lb (75.8 kg)   SpO2 97%   BMI 24.65 kg/m  Objective:   Physical Exam Cardiovascular:     Rate and Rhythm: Normal rate and regular rhythm.  Pulmonary:     Effort: Pulmonary effort is normal.     Breath sounds: Normal breath sounds.  Musculoskeletal:     Cervical back: Neck supple.  Skin:    General: Skin is warm and dry.           Assessment & Plan:  Urinary incontinence without sensory  awareness Assessment & Plan: Uncontrolled.  Doesn't seem that oxybutynin was effective, at least at the XL 5 mg dose.  Start Myrbetriq 25 mg daily. Consider oxybutynin XL 5 mg with plan to increase to 10 mg after 1 week if Myrbetriq isn't covered.   We did discussed timed toileting every hour while awake. Consider urology referral.  UA today with 1+ leuks and 2+ blood. Culture ordered and pending. We do not have enough urine for microscopic.   Await results.   Orders: -     Mirabegron ER; Take 1 tablet (25 mg total) by mouth daily. For overactive bladder  Dispense: 90 tablet; Refill: 0 -     POCT Urinalysis Dipstick (Automated) -     Urine Culture        Doreene Nest, NP

## 2023-06-27 ENCOUNTER — Telehealth: Payer: Self-pay | Admitting: Primary Care

## 2023-06-27 ENCOUNTER — Other Ambulatory Visit: Payer: Self-pay | Admitting: Primary Care

## 2023-06-27 DIAGNOSIS — N3001 Acute cystitis with hematuria: Secondary | ICD-10-CM

## 2023-06-27 MED ORDER — AMOXICILLIN-POT CLAVULANATE 875-125 MG PO TABS: 1.0000 | ORAL_TABLET | Freq: Two times a day (BID) | ORAL | 0 refills | Status: DC

## 2023-06-27 NOTE — Telephone Encounter (Signed)
Returned call to patient, Unable to speak with patient. Left message for her to call the office back. See result note for further documentation.

## 2023-06-27 NOTE — Telephone Encounter (Signed)
Pt called returning [facility] call regarding lab results. Call back # [phone]

## 2023-06-28 ENCOUNTER — Other Ambulatory Visit: Payer: Self-pay | Admitting: Cardiovascular Disease

## 2023-06-28 DIAGNOSIS — I4891 Unspecified atrial fibrillation: Secondary | ICD-10-CM

## 2023-06-28 NOTE — Telephone Encounter (Signed)
Prescription refill request for Eliquis received. Indication:dvt Last office visit:3/24 Scr:1.28  4/24 Age: 89 Weight:75.8  kg  Prescription refilled

## 2023-06-29 DIAGNOSIS — J301 Allergic rhinitis due to pollen: Secondary | ICD-10-CM | POA: Diagnosis not present

## 2023-06-29 DIAGNOSIS — F419 Anxiety disorder, unspecified: Secondary | ICD-10-CM | POA: Diagnosis not present

## 2023-06-29 DIAGNOSIS — E1151 Type 2 diabetes mellitus with diabetic peripheral angiopathy without gangrene: Secondary | ICD-10-CM | POA: Diagnosis not present

## 2023-06-29 DIAGNOSIS — K219 Gastro-esophageal reflux disease without esophagitis: Secondary | ICD-10-CM | POA: Diagnosis not present

## 2023-06-29 DIAGNOSIS — Z95820 Peripheral vascular angioplasty status with implants and grafts: Secondary | ICD-10-CM | POA: Diagnosis not present

## 2023-06-29 DIAGNOSIS — K589 Irritable bowel syndrome without diarrhea: Secondary | ICD-10-CM | POA: Diagnosis not present

## 2023-06-29 DIAGNOSIS — Z5986 Financial insecurity: Secondary | ICD-10-CM | POA: Diagnosis not present

## 2023-06-29 DIAGNOSIS — Z91013 Allergy to seafood: Secondary | ICD-10-CM | POA: Diagnosis not present

## 2023-06-29 DIAGNOSIS — N390 Urinary tract infection, site not specified: Secondary | ICD-10-CM | POA: Diagnosis not present

## 2023-06-29 DIAGNOSIS — I129 Hypertensive chronic kidney disease with stage 1 through stage 4 chronic kidney disease, or unspecified chronic kidney disease: Secondary | ICD-10-CM | POA: Diagnosis not present

## 2023-06-29 DIAGNOSIS — Z833 Family history of diabetes mellitus: Secondary | ICD-10-CM | POA: Diagnosis not present

## 2023-06-29 DIAGNOSIS — E785 Hyperlipidemia, unspecified: Secondary | ICD-10-CM | POA: Diagnosis not present

## 2023-06-29 DIAGNOSIS — M1990 Unspecified osteoarthritis, unspecified site: Secondary | ICD-10-CM | POA: Diagnosis not present

## 2023-06-29 DIAGNOSIS — H409 Unspecified glaucoma: Secondary | ICD-10-CM | POA: Diagnosis not present

## 2023-06-29 DIAGNOSIS — Z974 Presence of external hearing-aid: Secondary | ICD-10-CM | POA: Diagnosis not present

## 2023-06-29 DIAGNOSIS — M2040 Other hammer toe(s) (acquired), unspecified foot: Secondary | ICD-10-CM | POA: Diagnosis not present

## 2023-07-10 ENCOUNTER — Other Ambulatory Visit: Payer: Self-pay | Admitting: Primary Care

## 2023-07-10 DIAGNOSIS — Z6379 Other stressful life events affecting family and household: Secondary | ICD-10-CM

## 2023-07-10 DIAGNOSIS — K219 Gastro-esophageal reflux disease without esophagitis: Secondary | ICD-10-CM

## 2023-07-12 NOTE — Progress Notes (Unsigned)
     T. , MD, CAQ Sports Medicine [facility] at [facility] [address] [place], [address]  Phone: [phone]  FAX: [phone]  BETHANNY EICK - 89 y.o. female  MRN [id number]  Date of Birth: 12-Jan-1932  Date: 07/13/2023  PCP: Doreene Nest, NP  Referral: Doreene Nest, NP  No chief complaint on file.  Subjective:   LONNA MAS is a 89 y.o. very pleasant female patient with There is no height or weight on file to calculate BMI. who presents with the following:  She is a very well-known patient, known for many years.  Last time I saw her in June, she was having a flareup of her first MTP joint osteoarthritis on the right side.  I did do an intra-articular injection of the MTP joint.  Also placed her on some oral steroids, given some ongoing pain in both knees and ankles.  I saw her in March 28, 2023, and at that point she was having a knee flare.  I did do a right-sided knee intra-articular injection at that point.    Review of Systems is noted in the HPI, as appropriate  Objective:   There were no vitals taken for this visit.  GEN: No acute distress; alert,appropriate. PULM: Breathing comfortably in no respiratory distress PSYCH: Normally interactive.   Laboratory and Imaging Data:  Assessment and Plan:   ***

## 2023-07-13 ENCOUNTER — Encounter: Payer: Self-pay | Admitting: Family Medicine

## 2023-07-13 ENCOUNTER — Ambulatory Visit: Payer: Medicare PPO | Admitting: Family Medicine

## 2023-07-13 VITALS — BP 130/74 | HR 90 | Temp 97.2°F | Ht 69.0 in | Wt 169.5 lb

## 2023-07-13 DIAGNOSIS — M25571 Pain in right ankle and joints of right foot: Secondary | ICD-10-CM | POA: Diagnosis not present

## 2023-07-13 DIAGNOSIS — M170 Bilateral primary osteoarthritis of knee: Secondary | ICD-10-CM | POA: Diagnosis not present

## 2023-07-13 DIAGNOSIS — M1711 Unilateral primary osteoarthritis, right knee: Secondary | ICD-10-CM

## 2023-07-13 DIAGNOSIS — G8929 Other chronic pain: Secondary | ICD-10-CM

## 2023-07-13 MED ORDER — TRIAMCINOLONE ACETONIDE 40 MG/ML IJ SUSP
40.0000 mg | Freq: Once | INTRAMUSCULAR | Status: AC
Start: 2023-07-13 — End: 2023-07-13
  Administered 2023-07-13: 40 mg via INTRA_ARTICULAR

## 2023-07-13 NOTE — Patient Instructions (Signed)
Voltaren 1% gel, over the counter ?You can apply up to 4 times a day ? ?This can be applied to any joint: knee, wrist, fingers, elbows, shoulders, feet and ankles. ?Can apply to any tendon: tennis elbow, achilles, tendon, rotator cuff or any other tendon. ? ?Minimal is absorbed in the bloodstream: ok with oral anti-inflammatory or a blood thinner. ? ?Cost is about 9 dollars  ?

## 2023-07-14 ENCOUNTER — Encounter (INDEPENDENT_AMBULATORY_CARE_PROVIDER_SITE_OTHER): Payer: Self-pay

## 2023-07-15 ENCOUNTER — Ambulatory Visit: Payer: Medicare PPO | Admitting: Podiatry

## 2023-07-15 DIAGNOSIS — M19071 Primary osteoarthritis, right ankle and foot: Secondary | ICD-10-CM

## 2023-07-15 DIAGNOSIS — M7751 Other enthesopathy of right foot: Secondary | ICD-10-CM | POA: Diagnosis not present

## 2023-07-15 DIAGNOSIS — E0843 Diabetes mellitus due to underlying condition with diabetic autonomic (poly)neuropathy: Secondary | ICD-10-CM | POA: Diagnosis not present

## 2023-07-15 NOTE — Progress Notes (Signed)
Chief Complaint  Patient presents with   Foot Pain    Heel f/u      SUBJECTIVE Patient with a history of diabetes mellitus presents to office today for follow-up evaluation of right foot pain.  She continues to take the gabapentin.  She also has pain to the bilateral forefoot.  She did not set up an appointment last visit for diabetic shoes.  Presenting for further treatment evaluation   Past Medical History:  Diagnosis Date   Acute bilateral thoracic back pain 08/16/2019   Allergy    Cipro, Plavix, Statins   Anemia    Arthritis    CAD (coronary artery disease)    a. s/p tandem Promus DES to LAD in 2009 by Dr. Juanda Chance;  b.  LHC (03/22/14):  prox LAD 30%, mid LAD 40%, LAD stent ok with dist 20% ISR, apical LAD occluded with L-L collats filling apical vessel (too small for PCI), mid RCA 20%, EF 70%.  Med Rx   Cancer [facility])    Chronic kidney disease, stage 3b (HCC)    Colon polyps    Diabetes mellitus    Diagnosed 2012   Diverticulosis of colon (without mention of hemorrhage)    DVT (deep venous thrombosis) (HCC)    GERD (gastroesophageal reflux disease)    Glaucoma    Grade I diastolic dysfunction    Grade I diastolic dysfunction    Herpes zoster without complication 04/24/2020   Hyperlipidemia    intol of statins   Hypertension    Kidney stones    Left atrial dilation    Moderate aortic regurgitation    NHL (non-Hodgkin's lymphoma) (HCC) dx'd 2003   chemo/xrt comp 2003   Personal history of colonic polyps    Proctitis    Pruritic intertrigo 07/18/2014   Urticaria     OBJECTIVE General Patient is awake, alert, and oriented x 3 and in no acute distress. Derm Skin is dry and supple bilateral. Negative open lesions or macerations. Remaining integument unremarkable. Nails are tender, long, thickened and dystrophic with subungual debris, consistent with onychomycosis, 1-5 bilateral. No signs of infection noted. Vasc  DP and PT pedal pulses palpable bilaterally. Temperature  gradient within normal limits.  Neuro Epicritic and protective threshold sensation diminished bilaterally.  Musculoskeletal Exam No symptomatic pedal deformities noted bilateral. Muscular strength within normal limits.  There is some tenderness with palpation of the first MTP as well as some pain with range of motion as well.  ASSESSMENT 1. Diabetes Mellitus w/ peripheral neuropathy 2.  First MTP capsulitis/hallux limitus bilateral 3.  Recent DVT RLE  PLAN OF CARE -Patient evaluated -Appointment with orthotics department for diabetic custom molded orthotics.  Order placed last visit but apparently the patient did not have an appointment for diabetic shoes.  This should help support the foot and stabilize the patient reducing the risk of fall and reduce potentially similar pain and tenderness in her feet.  Recommend more of a functional type orthotic versus accommodative diabetic insole. -Continue gabapentin nightly as per PCP prescription  -Return to clinic with me as needed.   Felecia Shelling, DPM [facility]  Dr. Felecia Shelling, DPM    [address], [place] [address]  Office [phone]  Fax [phone]

## 2023-07-22 ENCOUNTER — Other Ambulatory Visit: Payer: Medicare PPO

## 2023-07-29 ENCOUNTER — Encounter: Payer: Self-pay | Admitting: Anesthesiology

## 2023-07-29 ENCOUNTER — Ambulatory Visit: Payer: Medicare PPO | Admitting: Cardiovascular Disease

## 2023-08-02 ENCOUNTER — Encounter: Payer: Self-pay | Admitting: Ophthalmology

## 2023-08-05 ENCOUNTER — Other Ambulatory Visit: Payer: Medicare PPO

## 2023-08-08 DIAGNOSIS — H903 Sensorineural hearing loss, bilateral: Secondary | ICD-10-CM | POA: Diagnosis not present

## 2023-08-09 NOTE — Discharge Instructions (Signed)

## 2023-08-10 ENCOUNTER — Encounter
Admission: RE | Disposition: A | Discharge status: Home / Self Care | Payer: Self-pay | Source: Home / Self Care | Attending: Ophthalmology

## 2023-08-10 ENCOUNTER — Encounter: Payer: Self-pay | Admitting: Ophthalmology

## 2023-08-10 ENCOUNTER — Ambulatory Visit
Admission: RE | Admit: 2023-08-10 | Discharge: 2023-08-10 | Disposition: A | Discharge status: Home / Self Care | Payer: Medicare PPO | Attending: Ophthalmology | Admitting: Ophthalmology

## 2023-08-10 ENCOUNTER — Telehealth: Payer: Self-pay | Admitting: Cardiovascular Disease

## 2023-08-10 ENCOUNTER — Other Ambulatory Visit: Payer: Self-pay

## 2023-08-10 DIAGNOSIS — Z539 Procedure and treatment not carried out, unspecified reason: Secondary | ICD-10-CM | POA: Diagnosis not present

## 2023-08-10 DIAGNOSIS — H401113 Primary open-angle glaucoma, right eye, severe stage: Secondary | ICD-10-CM | POA: Insufficient documentation

## 2023-08-10 DIAGNOSIS — I4891 Unspecified atrial fibrillation: Secondary | ICD-10-CM | POA: Insufficient documentation

## 2023-08-10 LAB — GLUCOSE, CAPILLARY: Glucose-Capillary: 154 mg/dL — ABNORMAL HIGH (ref 70–99)

## 2023-08-10 SURGERY — Goniotomy
Anesthesia: Topical | Laterality: Right

## 2023-08-10 MED ORDER — LACTATED RINGERS IV SOLN: INTRAVENOUS | Status: DC

## 2023-08-10 MED ORDER — TETRACAINE HCL 0.5 % OP SOLN: 1.0000 [drp] | OPHTHALMIC | Status: DC | PRN

## 2023-08-10 NOTE — Telephone Encounter (Signed)
New Message:        Dr Karlton Lemon is calling about this patient.

## 2023-08-10 NOTE — Telephone Encounter (Signed)
Dr Graciela Husbands has spoken with Dr Karlton Lemon re: patient.

## 2023-08-10 NOTE — Progress Notes (Signed)
Pt was canceled today due to being in Afib. Pt cardiologist was called by Dr. Karlton Lemon.

## 2023-08-10 NOTE — H&P (Signed)
  Canceled due to new onset Atrial Fibrillation. Referred to Cardiology.

## 2023-08-17 ENCOUNTER — Ambulatory Visit (HOSPITAL_COMMUNITY)
Admission: RE | Admit: 2023-08-17 | Discharge: 2023-08-17 | Disposition: A | Discharge status: Home / Self Care | Payer: Medicare PPO | Source: Ambulatory Visit | Attending: Internal Medicine | Admitting: Internal Medicine

## 2023-08-17 VITALS — BP 142/76 | HR 81 | Ht 69.02 in | Wt 169.0 lb

## 2023-08-17 DIAGNOSIS — Z7901 Long term (current) use of anticoagulants: Secondary | ICD-10-CM | POA: Insufficient documentation

## 2023-08-17 DIAGNOSIS — R9431 Abnormal electrocardiogram [ECG] [EKG]: Secondary | ICD-10-CM | POA: Insufficient documentation

## 2023-08-17 DIAGNOSIS — Z86718 Personal history of other venous thrombosis and embolism: Secondary | ICD-10-CM | POA: Diagnosis not present

## 2023-08-17 DIAGNOSIS — I4819 Other persistent atrial fibrillation: Secondary | ICD-10-CM | POA: Insufficient documentation

## 2023-08-17 DIAGNOSIS — D6869 Other thrombophilia: Secondary | ICD-10-CM | POA: Insufficient documentation

## 2023-08-17 DIAGNOSIS — I4891 Unspecified atrial fibrillation: Secondary | ICD-10-CM

## 2023-08-17 HISTORY — DX: Other persistent atrial fibrillation: I48.19

## 2023-08-17 NOTE — Progress Notes (Addendum)
Primary Care Physician: Doreene Nest, NP Primary Cardiologist: Verne Carrow, MD Electrophysiologist: None     Referring Physician: Dr. Bernerd Pho is a 89 y.o. female with a history of CAD, HLD, LE DVT, HTN, non-Hodgkin's lymphoma, DM, mitral regurgitation, and paroxysmal atrial fibrillation who presents for consultation in the [facility]. Scheduled for cataract procedure on 9/11 but canceled due to new onset of rate controlled Afib. Patient is on Eliquis for a CHADS2VASC score of 5.  On evaluation today, she is currently in rate controlled Afib. She does not appear to have cardiac awareness of arrhythmia. She notes over the weekend to have felt tired one day in particular but overall does not feel bad. She states she doesn't really feel tired all the time. She does not have shortness of breath.   Today, she denies symptoms of palpitations, chest pain, orthopnea, PND, lower extremity edema, dizziness, presyncope, syncope, snoring, daytime somnolence, bleeding, or neurologic sequela. The patient is tolerating medications without difficulties and is otherwise without complaint today.   she has a BMI of Body mass index is 24.94 kg/m.Marland Kitchen Filed Weights   08/17/23 1331  Weight: 76.7 kg    Current Outpatient Medications  Medication Sig Dispense Refill   Bempedoic Acid-Ezetimibe (NEXLIZET) 180-10 MG TABS Take 1 tablet by mouth daily. 90 tablet 3   Blood Glucose Monitoring Suppl DEVI 1 each by Does not apply route in the morning, at noon, and at bedtime. May substitute to any manufacturer covered by patient's insurance. 1 each 0   brimonidine-timolol (COMBIGAN) 0.2-0.5 % ophthalmic solution Place 1 drop into the right eye every 12 (twelve) hours.     Cholecalciferol (VITAMIN D-3) 1000 UNITS CAPS Take 1 capsule by mouth daily.     diclofenac Sodium (VOLTAREN) 1 % GEL APPLY 2 GRAMS TOPICALLY THREE TIMES A DAY AS NEEDED 100 g 2    dorzolamide-timolol (COSOPT) 2-0.5 % ophthalmic solution Place 1 drop into the right eye daily.     ELIQUIS 5 MG TABS tablet TAKE 1 TABLET TWICE DAILY 180 tablet 3   ferrous sulfate 325 (65 FE) MG tablet Take 1 tablet (325 mg total) by mouth daily with breakfast. 30 tablet 2   fexofenadine (ALLEGRA) 180 MG tablet Take 1 tablet (180 mg total) by mouth as needed for allergies or rhinitis. 90 tablet 0   gabapentin (NEURONTIN) 100 MG capsule Take 1 capsule (100 mg total) by mouth 3 (three) times daily. 90 capsule 3   glipiZIDE (GLUCOTROL XL) 10 MG 24 hr tablet Take 1 tablet (10 mg total) by mouth daily with breakfast. for diabetes. 90 tablet 1   Glucose Blood (BLOOD GLUCOSE TEST STRIPS) STRP 1 each by In Vitro route in the morning, at noon, and at bedtime. May substitute to any manufacturer covered by patient's insurance. 300 strip 5   isosorbide mononitrate (IMDUR) 60 MG 24 hr tablet Take 1.5 tablets (90 mg total) by mouth daily. 135 tablet 2   Lancets Misc. MISC 1 each by Does not apply route in the morning, at noon, and at bedtime. May substitute to any manufacturer covered by patient's insurance. 300 each 5   metoprolol succinate (TOPROL-XL) 100 MG 24 hr tablet Take one (1) tablet by mouth (100 mg) daily. Take with or immediately following a meal. 90 tablet 3   mirabegron ER (MYRBETRIQ) 25 MG TB24 tablet Take 1 tablet (25 mg total) by mouth daily. For overactive bladder 90 tablet 0  nitroGLYCERIN (NITROSTAT) 0.4 MG SL tablet Place 1 tablet (0.4 mg total) under the tongue every 5 (five) minutes as needed for chest pain. 75 tablet 2   pantoprazole (PROTONIX) 20 MG tablet TAKE 1 TABLET BY MOUTH DAILY FOR HEARTBURN 90 tablet 1   ROCKLATAN 0.02-0.005 % SOLN Place 1 drop into the right eye at bedtime.     sertraline (ZOLOFT) 100 MG tablet TAKE 1 TABLET BY MOUTH DAILY FOR ANXIETY AND DEPRESSION 90 tablet 0   vitamin B-12 (CYANOCOBALAMIN) 1000 MCG tablet Take 1,000 mcg by mouth daily.     No current  facility-administered medications for this encounter.    Atrial Fibrillation Management history:  Previous antiarrhythmic drugs: None Previous cardioversions: None Previous ablations: None Anticoagulation history: Eliquis   ROS- All systems are reviewed and negative except as per the HPI above.  Physical Exam: BP (!) 142/76   Pulse 81   Ht 5' 9.02" (1.753 m)   Wt 76.7 kg   BMI 24.94 kg/m   GEN: Well nourished, well developed in no acute distress NECK: No JVD; No carotid bruits CARDIAC: Irregularly irregular rate and rhythm, no murmurs, rubs, gallops RESPIRATORY:  Clear to auscultation without rales, wheezing or rhonchi  ABDOMEN: Soft, non-tender, non-distended EXTREMITIES:  No edema; No deformity   EKG today demonstrates  Vent. rate 81 BPM PR interval * ms QRS duration 76 ms QT/QTcB 392/455 ms P-R-T axes * 27 8 Atrial fibrillation Possible Anterior infarct , age undetermined Abnormal ECG When compared with ECG of 10-Aug-2023 09:18, PREVIOUS ECG IS PRESENT  Echo 04/07/22 demonstrated   1. Left ventricular ejection fraction, by estimation, is 60 to 65%. The  left ventricle has normal function. The left ventricle has no regional  wall motion abnormalities. There is mild concentric left ventricular  hypertrophy of the with severe basal  septal thickness. Left ventricular diastolic parameters are consistent  with Grade I diastolic dysfunction (impaired relaxation).   2. Right ventricular systolic function is normal. The right ventricular  size is normal.   3. Left atrial size was moderately dilated.   4. The pericardial effusion is posterior to the left ventricle. There is  no evidence of cardiac tamponade.   5. The mitral valve is normal in structure. No evidence of mitral valve  regurgitation. No evidence of mitral stenosis.   6. The aortic valve is tricuspid. Aortic valve regurgitation is mild.  Aortic valve sclerosis is present, with no evidence of aortic valve   stenosis.   7. The inferior vena cava is normal in size with greater than 50%  respiratory variability, suggesting right atrial pressure of 3 mmHg.    ASSESSMENT & PLAN CHA2DS2-VASc Score = 5  The patient's score is based upon: CHF History: 0 HTN History: 1 Diabetes History: 0 Stroke History: 0 Vascular Disease History: 1 Age Score: 2 Gender Score: 1       ASSESSMENT AND PLAN: Persistent Atrial Fibrillation (ICD10:  I48.19) The patient's CHA2DS2-VASc score is 5, indicating a 7.2% annual risk of stroke.    She is currently in rate controlled Afib.   We discussed at length the possibility of rate control vs attempt at cardioversion / medication for treatment of this. Patient notes to overall not feel bad, and she cannot readily state she has been consistently tired this past week while presumably having been in Afib. I advised patient that given age and difficulty to ascertain symptoms while in afib it is definitely reasonable to consider rate control going forward. She will  discuss further with daughter and also may try to speak with [name] for his opinion.   Secondary Hypercoagulable State (ICD10:  D68.69) The patient is at significant risk for stroke/thromboembolism based upon her CHA2DS2-VASc Score of 5.  Continue Apixaban (Eliquis).  She has been on Eliquis for DVT but now given Chads score recommend indefinite anticoagulation. Continue without interruption.   She will call if wishes to proceed with scheduling cardioversion, otherwise f/u Afib clinic prn.    L[place] Bells, PA-C  Afib Clinic [facility] [address] [place], [place] [address] [phone]

## 2023-08-19 ENCOUNTER — Ambulatory Visit: Payer: Medicare PPO | Admitting: Podiatry

## 2023-08-19 ENCOUNTER — Encounter: Payer: Self-pay | Admitting: Cardiovascular Disease

## 2023-08-19 ENCOUNTER — Ambulatory Visit: Payer: Medicare PPO

## 2023-08-19 ENCOUNTER — Encounter: Payer: Self-pay | Admitting: Podiatry

## 2023-08-19 ENCOUNTER — Telehealth (HOSPITAL_COMMUNITY): Payer: Self-pay | Admitting: *Deleted

## 2023-08-19 VITALS — BP 150/82 | HR 84

## 2023-08-19 DIAGNOSIS — M7751 Other enthesopathy of right foot: Secondary | ICD-10-CM

## 2023-08-19 DIAGNOSIS — L309 Dermatitis, unspecified: Secondary | ICD-10-CM | POA: Diagnosis not present

## 2023-08-19 DIAGNOSIS — E0843 Diabetes mellitus due to underlying condition with diabetic autonomic (poly)neuropathy: Secondary | ICD-10-CM

## 2023-08-19 MED ORDER — TRIAMCINOLONE ACETONIDE 0.1 % EX CREA: 1.0000 | TOPICAL_CREAM | Freq: Two times a day (BID) | CUTANEOUS | 1 refills | Status: DC

## 2023-08-19 MED ORDER — BETAMETHASONE SOD PHOS & ACET 6 (3-3) MG/ML IJ SUSP
3.0000 mg | Freq: Once | INTRAMUSCULAR | Status: AC
Start: 2023-08-19 — End: 2023-08-19
  Administered 2023-08-19: 3 mg via INTRA_ARTICULAR

## 2023-08-19 NOTE — Progress Notes (Signed)
   Chief Complaint  Patient presents with   Foot Pain    "It hurts, it swells and it's blue every morning."    SUBJECTIVE Patient with a history of diabetes mellitus presenting for continued pain to the right forefoot.  Patient notices pain around the right first MTP.  She also has an appointment today for diabetic shoes and insoles.   Past Medical History:  Diagnosis Date   Acute bilateral thoracic back pain 08/16/2019   Allergy    Cipro, Plavix, Statins   Anemia    Arthritis    CAD (coronary artery disease)    a. s/p tandem Promus DES to LAD in 2009 by Dr. Juanda Chance;  b.  LHC (03/22/14):  prox LAD 30%, mid LAD 40%, LAD stent ok with dist 20% ISR, apical LAD occluded with L-L collats filling apical vessel (too small for PCI), mid RCA 20%, EF 70%.  Med Rx   Cancer [facility])    Chronic kidney disease, stage 3b (HCC)    Colon polyps    Diabetes mellitus    Diagnosed 2012   Diverticulosis of colon (without mention of hemorrhage)    DVT (deep venous thrombosis) (HCC)    GERD (gastroesophageal reflux disease)    Glaucoma    Grade I diastolic dysfunction    Grade I diastolic dysfunction    Herpes zoster without complication 04/24/2020   Hyperlipidemia    intol of statins   Hypertension    Kidney stones    Left atrial dilation    Moderate aortic regurgitation    NHL (non-Hodgkin's lymphoma) (HCC) dx'd 2003   chemo/xrt comp 2003   Personal history of colonic polyps    Proctitis    Pruritic intertrigo 07/18/2014   Urticaria     OBJECTIVE General Patient is awake, alert, and oriented x 3 and in no acute distress. Derm there is some slight superficial dermatitis around the first MTP of the right foot possibly secondary to friction and rubbing in certain shoes.  No open wound.  Clinically no indication of infection or deeper cellulitis. Vasc  DP and PT pedal pulses palpable bilaterally. Temperature gradient within normal limits.  Neuro light touch and protective threshold sensation  diminished bilaterally.  Musculoskeletal Exam there continues to be tenderness with palpation of the first MTP as well as some pain with range of motion as well.   ASSESSMENT 1. Diabetes Mellitus w/ peripheral neuropathy 2.  First MTP capsulitis/hallux limitus bilateral 3.  Recent DVT RLE 4.  Dermatitis right forefoot  PLAN OF CARE -Patient evaluated - Injection of 0.5 cc Celestone Soluspan injected around the first MTP right foot -Continue gabapentin nightly as per PCP prescription  - Prescription for triamcinolone 0.1% cream to apply to the dermatitis of the right forefoot -Patient has an appointment today with our orthotics department for shoes and custom molded diabetic insoles -Return to clinic with me as needed   Felecia Shelling, DPM [facility]  Dr. Felecia Shelling, DPM    [name] [place], [place] [address]                Office [phone]  Fax [phone]

## 2023-08-19 NOTE — Progress Notes (Signed)
Patient presents to the office today for diabetic shoe and insole measuring.  Patient was measured with brannock device to determine size and width for 1 pair of extra depth shoes and foam casted for 3 pair of insoles.   Documentation of medical necessity will be sent to patient's treating diabetic doctor to verify and sign.   Patient's diabetic provider: Vernona Rieger NP / Overseeing Dr   Vivia Ewing and insoles will be ordered at that time and patient will be notified for an appointment for fitting when they are in.   Shoe size (per patient): 9.5 Brannock measurement: 9.5 Patient shoe selection- Shoe choice:   A300W / A600W 2nd Shoe size ordered: 9.5MD [employer] Signed / ABN

## 2023-08-19 NOTE — Telephone Encounter (Signed)
Patient and her daughter [name] [name] would like [name]'s opinion regarding recent afib clinic visit for newly diagnosed afib. Please review [name] Bells note from 9/18  She is currently in rate controlled Afib.    We discussed at length the possibility of rate control vs attempt at cardioversion / medication for treatment of this. Patient notes to overall not feel bad, and she cannot readily state she has been consistently tired this past week while presumably having been in Afib. I advised patient that given age and difficulty to ascertain symptoms while in afib it is definitely reasonable to consider rate control going forward. She will discuss further with daughter and also may try to speak with [name] for his opinion.    Please advise daughter [name] [name] at [phone] with recommendations so they know how to proceed.

## 2023-08-19 NOTE — Telephone Encounter (Signed)
Error

## 2023-08-21 ENCOUNTER — Other Ambulatory Visit: Payer: Self-pay | Admitting: Primary Care

## 2023-08-21 DIAGNOSIS — K219 Gastro-esophageal reflux disease without esophagitis: Secondary | ICD-10-CM

## 2023-08-22 NOTE — Telephone Encounter (Signed)
Kathleene Hazel, MD  Shona Simpson, RN; Lendon Ka, RN; P Cv [facility] Triage Caller: Unspecified (3 days ago,  3:36 PM)  Can we let them know that rate control is a great strategy if she is feeling ok. We could try to cardiovert her to sinus if she begins to feel weak,tired and out of breath. Thanks, Thayer Ohm   The patient's daughter, Tresa Endo, has been notified.  She wanted to know if the eye surgery could take place while patient in afib.  Adv that would be up to the ophthalmolgist, as long as her bp and hr are controlled.  She has an appointment to go back and see ophthalmology next week.

## 2023-08-24 ENCOUNTER — Ambulatory Visit: Payer: Medicare PPO | Admitting: Primary Care

## 2023-08-24 ENCOUNTER — Telehealth: Payer: Self-pay | Admitting: Cardiovascular Disease

## 2023-08-24 ENCOUNTER — Other Ambulatory Visit: Payer: Self-pay

## 2023-08-24 ENCOUNTER — Encounter: Payer: Self-pay | Admitting: Primary Care

## 2023-08-24 VITALS — BP 146/82 | HR 69 | Temp 97.3°F | Ht 69.02 in | Wt 165.0 lb

## 2023-08-24 DIAGNOSIS — D508 Other iron deficiency anemias: Secondary | ICD-10-CM | POA: Diagnosis not present

## 2023-08-24 DIAGNOSIS — N1832 Chronic kidney disease, stage 3b: Secondary | ICD-10-CM | POA: Diagnosis not present

## 2023-08-24 DIAGNOSIS — K219 Gastro-esophageal reflux disease without esophagitis: Secondary | ICD-10-CM

## 2023-08-24 DIAGNOSIS — M25561 Pain in right knee: Secondary | ICD-10-CM | POA: Diagnosis not present

## 2023-08-24 DIAGNOSIS — I4819 Other persistent atrial fibrillation: Secondary | ICD-10-CM

## 2023-08-24 DIAGNOSIS — M25562 Pain in left knee: Secondary | ICD-10-CM

## 2023-08-24 DIAGNOSIS — I2510 Atherosclerotic heart disease of native coronary artery without angina pectoris: Secondary | ICD-10-CM

## 2023-08-24 DIAGNOSIS — F419 Anxiety disorder, unspecified: Secondary | ICD-10-CM | POA: Diagnosis not present

## 2023-08-24 DIAGNOSIS — E782 Mixed hyperlipidemia: Secondary | ICD-10-CM

## 2023-08-24 DIAGNOSIS — K5900 Constipation, unspecified: Secondary | ICD-10-CM

## 2023-08-24 DIAGNOSIS — Z7984 Long term (current) use of oral hypoglycemic drugs: Secondary | ICD-10-CM | POA: Diagnosis not present

## 2023-08-24 DIAGNOSIS — M8580 Other specified disorders of bone density and structure, unspecified site: Secondary | ICD-10-CM | POA: Diagnosis not present

## 2023-08-24 DIAGNOSIS — I10 Essential (primary) hypertension: Secondary | ICD-10-CM | POA: Diagnosis not present

## 2023-08-24 DIAGNOSIS — E1122 Type 2 diabetes mellitus with diabetic chronic kidney disease: Secondary | ICD-10-CM

## 2023-08-24 DIAGNOSIS — Z0000 Encounter for general adult medical examination without abnormal findings: Secondary | ICD-10-CM

## 2023-08-24 DIAGNOSIS — D509 Iron deficiency anemia, unspecified: Secondary | ICD-10-CM | POA: Insufficient documentation

## 2023-08-24 DIAGNOSIS — G8929 Other chronic pain: Secondary | ICD-10-CM

## 2023-08-24 DIAGNOSIS — F32A Depression, unspecified: Secondary | ICD-10-CM

## 2023-08-24 DIAGNOSIS — Z23 Encounter for immunization: Secondary | ICD-10-CM

## 2023-08-24 DIAGNOSIS — N3942 Incontinence without sensory awareness: Secondary | ICD-10-CM

## 2023-08-24 LAB — COMPREHENSIVE METABOLIC PANEL
ALT: 8 U/L (ref 0–35)
AST: 16 U/L (ref 0–37)
Albumin: 4.2 g/dL (ref 3.5–5.2)
Alkaline Phosphatase: 36 U/L — ABNORMAL LOW (ref 39–117)
BUN: 25 mg/dL — ABNORMAL HIGH (ref 6–23)
CO2: 31 mEq/L (ref 19–32)
Calcium: 9.5 mg/dL (ref 8.4–10.5)
Chloride: 101 mEq/L (ref 96–112)
Creatinine, Ser: 1.44 mg/dL — ABNORMAL HIGH (ref 0.40–1.20)
GFR: 32.23 mL/min — ABNORMAL LOW (ref >60.00)
Glucose, Bld: 249 mg/dL — ABNORMAL HIGH (ref 70–99)
Potassium: 4.7 mEq/L (ref 3.5–5.1)
Sodium: 139 mEq/L (ref 135–145)
Total Bilirubin: 0.6 mg/dL (ref 0.2–1.2)
Total Protein: 6.3 g/dL (ref 6.0–8.3)

## 2023-08-24 LAB — CBC
HCT: 39.1 % (ref 36.0–46.0)
Hemoglobin: 12.7 g/dL (ref 12.0–15.0)
MCHC: 32.6 g/dL (ref 30.0–36.0)
MCV: 100.8 fl — ABNORMAL HIGH (ref 78.0–100.0)
Platelets: 247 10*3/uL (ref 150.0–400.0)
RBC: 3.88 Mil/uL (ref 3.87–5.11)
RDW: 13.3 % (ref 11.5–15.5)
WBC: 3.7 10*3/uL — ABNORMAL LOW (ref 4.0–10.5)

## 2023-08-24 LAB — IBC + FERRITIN
Ferritin: 38 ng/mL (ref 10.0–291.0)
Iron: 88 ug/dL (ref 42–145)
Saturation Ratios: 25.4 % (ref 20.0–50.0)
TIBC: 345.8 ug/dL (ref 250.0–450.0)
Transferrin: 247 mg/dL (ref 212.0–360.0)

## 2023-08-24 LAB — LIPID PANEL
Cholesterol: 111 mg/dL (ref 0–200)
HDL: 33.8 mg/dL — ABNORMAL LOW (ref >39.00)
Total CHOL/HDL Ratio: 3
Triglycerides: 452 mg/dL — ABNORMAL HIGH (ref 0.0–149.0)

## 2023-08-24 LAB — LDL CHOLESTEROL, DIRECT: Direct LDL: 35 mg/dL

## 2023-08-24 LAB — HEMOGLOBIN A1C: Hgb A1c MFr Bld: 7.9 % — ABNORMAL HIGH (ref 4.6–6.5)

## 2023-08-24 MED ORDER — ISOSORBIDE MONONITRATE ER 60 MG PO TB24: 90.0000 mg | ORAL_TABLET | Freq: Every day | ORAL | 1 refills | Status: DC

## 2023-08-24 MED ORDER — MIRABEGRON ER 50 MG PO TB24: 50.0000 mg | ORAL_TABLET | Freq: Every day | ORAL | 3 refills | Status: DC

## 2023-08-24 NOTE — Assessment & Plan Note (Signed)
Due, she has this scheduled.  Continue calcium and vitamin D.

## 2023-08-24 NOTE — Progress Notes (Signed)
Subjective:    Patient ID: [name] [name], female    DOB: 08/11/1932, 89 y.o.   MRN: [id number]  HPI  [name] [name] is a very pleasant 89 y.o. female who presents today for complete physical and follow up of chronic conditions.  Immunizations: -Tetanus: Completed in 2000 -Influenza: Influenza vaccine provided today. -Shingles: Completed Zostavax -Pneumonia: Completed Prevnar 13 in 2015, pneumovax 23 in 2021  Diet: Fair diet.  Exercise: No regular exercise.  Eye exam: Completes annually  Dental exam: Completes semi-annually    Mammogram: Completed in 2024 Bone Density Scan: Completed in 2021  Colonoscopy: Completed in 2012  BP Readings from Last 3 Encounters:  08/24/23 (!) 146/82  08/19/23 (!) 150/82  08/17/23 (!) 142/76      Review of Systems  Constitutional:  Negative for unexpected weight change.  HENT:  Negative for rhinorrhea.   Respiratory:  Negative for cough and shortness of breath.   Cardiovascular:  Negative for chest pain.  Gastrointestinal:  Negative for constipation and diarrhea.  Genitourinary:  Negative for difficulty urinating.       Continued urinary incontinence  Musculoskeletal:  Positive for arthralgias.  Skin:  Negative for rash.  Allergic/Immunologic: Negative for environmental allergies.  Neurological:  Negative for dizziness and headaches.  Psychiatric/Behavioral:  The patient is not nervous/anxious.          Past Medical History:  Diagnosis Date   Acute bilateral thoracic back pain 08/16/2019   Allergy    Cipro, Plavix, Statins   Anemia    Arthritis    CAD (coronary artery disease)    a. s/p tandem Promus DES to LAD in 2009 by Dr. Juanda Chance;  b.  LHC (03/22/14):  prox LAD 30%, mid LAD 40%, LAD stent ok with dist 20% ISR, apical LAD occluded with L-L collats filling apical vessel (too small for PCI), mid RCA 20%, EF 70%.  Med Rx   Cancer [facility])    Chronic kidney disease, stage 3b (HCC)    Colon polyps    Diabetes mellitus    Diagnosed  2012   Diverticulosis of colon (without mention of hemorrhage)    DVT (deep venous thrombosis) (HCC)    GERD (gastroesophageal reflux disease)    Glaucoma    Grade I diastolic dysfunction    Grade I diastolic dysfunction    Herpes zoster without complication 04/24/2020   Hyperlipidemia    intol of statins   Hypertension    Kidney stones    Left atrial dilation    Moderate aortic regurgitation    NHL (non-Hodgkin's lymphoma) (HCC) dx'd 2003   chemo/xrt comp 2003   Personal history of colonic polyps    Proctitis    Pruritic intertrigo 07/18/2014   Urticaria     Social History   Socioeconomic History   Marital status: Widowed    Spouse name: Not on file   Number of children: 0   Years of education: Not on file   Highest education level: 12th grade  Occupational History   Occupation: Retired    Associate Professor: RETIRED   Occupation: retired  Tobacco Use   Smoking status: Never   Smokeless tobacco: Never  Vaping Use   Vaping status: Never Used  Substance and Sexual Activity   Alcohol use: No   Drug use: No   Sexual activity: Not Currently  Other Topics Concern   Not on file  Social History Narrative   Not on file   Social Determinants of Corporate investment banker  Strain: Low Risk  (02/16/2023)   Overall Financial Resource Strain (CARDIA)    Difficulty of Paying Living Expenses: Not hard at all  Food Insecurity: No Food Insecurity (02/16/2023)   Hunger Vital Sign    Worried About Running Out of Food in the Last Year: Never true    Ran Out of Food in the Last Year: Never true  Transportation Needs: No Transportation Needs (02/16/2023)   PRAPARE - Administrator, Civil Service (Medical): No    Lack of Transportation (Non-Medical): No  Physical Activity: Inactive (06/25/2022)   Exercise Vital Sign    Days of Exercise per Week: 0 days    Minutes of Exercise per Session: 0 min  Stress: No Stress Concern Present (06/25/2022)   [employer] of Occupational  Health - Occupational Stress Questionnaire    Feeling of Stress : Not at all  Social Connections: Moderately Integrated (02/16/2023)   Social Connection and Isolation Panel [NHANES]    Frequency of Communication with Friends and Family: More than three times a week    Frequency of Social Gatherings with Friends and Family: Once a week    Attends Religious Services: More than 4 times per year    Active Member of [employer] or Organizations: Yes    Attends Banker Meetings: More than 4 times per year    Marital Status: Widowed  Intimate Partner Violence: Not At Risk (06/23/2021)   Humiliation, Afraid, Rape, and Kick questionnaire    Fear of Current or Ex-Partner: No    Emotionally Abused: No    Physically Abused: No    Sexually Abused: No    Past Surgical History:  Procedure Laterality Date   cardiac cath-neg     CATARACT EXTRACTION Bilateral    CORONARY ANGIOPLASTY WITH STENT PLACEMENT     x 2   dexa-neg     KNEE SURGERY  10/30/2003   left   LEFT HEART CATHETERIZATION WITH CORONARY ANGIOGRAM N/A 03/22/2014   Procedure: LEFT HEART CATHETERIZATION WITH CORONARY ANGIOGRAM;  Surgeon: Kathleene Hazel, MD;  Location[facility] CATH LAB;  Service: Cardiovascular;  Laterality: N/A;   stress cardiolite     VAGINAL HYSTERECTOMY     partial, fibroids, one ovary left    Family History  Problem Relation Age of Onset   Cancer Mother        jaw   Hypertension Father    Heart attack Father    Heart attack Sister    Cancer Sister        unknown primary-mets throughout body   Colon cancer Neg Hx    Esophageal cancer Neg Hx    Pancreatic cancer Neg Hx    Stomach cancer Neg Hx    Liver disease Neg Hx     Allergies  Allergen Reactions   Other Rash and Anaphylaxis   Shellfish Allergy Anaphylaxis and Rash   Cephalexin     REACTION: trash and swelling REACTION: trash and swelling   Ciprofloxacin     REACTION: u/k   Clopidogrel Bisulfate     REACTION: swelling, rash   Codeine  Phosphate     REACTION: unspecified   Ezetimibe-Simvastatin     REACTION: myalgias REACTION: myalgias   Hydrocod Poli-Chlorphe Poli Er     REACTION: u/k   Lidocaine Hives    ONLY TO ADHESIVE LIDOCAINE PATCHES. NO PROBLEM WITH INJECTABLE LIDOCAINE.   Nitrofurantoin     REACTION: [place] REACTION: [place]   Pregabalin     REACTION: felt  bad REACTION: felt bad   Propoxyphene N-Acetaminophen     REACTION: [place]   Rofecoxib     unk unk    Sulfamethoxazole     REACTION: unspecified   Sulfonamide Derivatives     REACTION: unspecified   Celecoxib Rash    REACTION: [place] REACTION: [place]   Codeine Rash    REACTION: [place] REACTION: [place]   Hydrocod Poli-Chlorphe Poli Er Rash   Propoxyphene Rash   Sulfa Antibiotics Rash    REACTION: unspecified REACTION: unspecified    Current Outpatient Medications on File Prior to Visit  Medication Sig Dispense Refill   Bempedoic Acid-Ezetimibe (NEXLIZET) 180-10 MG TABS Take 1 tablet by mouth daily. 90 tablet 3   Blood Glucose Monitoring Suppl DEVI 1 each by Does not apply route in the morning, at noon, and at bedtime. May substitute to any manufacturer covered by patient's insurance. 1 each 0   brimonidine-timolol (COMBIGAN) 0.2-0.5 % ophthalmic solution Place 1 drop into the right eye every 12 (twelve) hours.     Cholecalciferol (VITAMIN D-3) 1000 UNITS CAPS Take 1 capsule by mouth daily.     diclofenac Sodium (VOLTAREN) 1 % GEL APPLY 2 GRAMS TOPICALLY THREE TIMES A DAY AS NEEDED 100 g 2   dorzolamide-timolol (COSOPT) 2-0.5 % ophthalmic solution Place 1 drop into the right eye daily.     ELIQUIS 5 MG TABS tablet TAKE 1 TABLET TWICE DAILY 180 tablet 3   ferrous sulfate 325 (65 FE) MG tablet Take 1 tablet (325 mg total) by mouth daily with breakfast. 30 tablet 2   fexofenadine (ALLEGRA) 180 MG tablet Take 1 tablet (180 mg total) by mouth as needed for allergies or rhinitis. 90 tablet 0   gabapentin (NEURONTIN) 100 MG capsule Take 1 capsule (100 mg total) by mouth 3  (three) times daily. 90 capsule 3   glipiZIDE (GLUCOTROL XL) 10 MG 24 hr tablet Take 1 tablet (10 mg total) by mouth daily with breakfast. for diabetes. 90 tablet 1   Glucose Blood (BLOOD GLUCOSE TEST STRIPS) STRP 1 each by In Vitro route in the morning, at noon, and at bedtime. May substitute to any manufacturer covered by patient's insurance. 300 strip 5   isosorbide mononitrate (IMDUR) 60 MG 24 hr tablet Take 1.5 tablets (90 mg total) by mouth daily. 135 tablet 2   Lancets Misc. MISC 1 each by Does not apply route in the morning, at noon, and at bedtime. May substitute to any manufacturer covered by patient's insurance. 300 each 5   metoprolol succinate (TOPROL-XL) 100 MG 24 hr tablet Take one (1) tablet by mouth (100 mg) daily. Take with or immediately following a meal. 90 tablet 3   nitroGLYCERIN (NITROSTAT) 0.4 MG SL tablet Place 1 tablet (0.4 mg total) under the tongue every 5 (five) minutes as needed for chest pain. 75 tablet 2   pantoprazole (PROTONIX) 20 MG tablet TAKE 1 TABLET BY MOUTH DAILY FOR HEARTBURN 90 tablet 1   ROCKLATAN 0.02-0.005 % SOLN Place 1 drop into the right eye at bedtime.     sertraline (ZOLOFT) 100 MG tablet TAKE 1 TABLET BY MOUTH DAILY FOR ANXIETY AND DEPRESSION 90 tablet 0   triamcinolone cream (KENALOG) 0.1 % Apply 1 Application topically 2 (two) times daily. 30 g 1   vitamin B-12 (CYANOCOBALAMIN) 1000 MCG tablet Take 1,000 mcg by mouth daily.     [DISCONTINUED] loratadine (CLARITIN) 10 MG tablet Take 10 mg by mouth as needed.      No current  facility-administered medications on file prior to visit.    BP (!) 146/82   Pulse 69   Temp (!) 97.3 F (36.3 C) (Temporal)   Ht 5' 9.02" (1.753 m)   Wt 165 lb (74.8 kg)   SpO2 95%   BMI 24.35 kg/m  Objective:   Physical Exam HENT:     Right Ear: Tympanic membrane and ear canal normal.     Left Ear: Tympanic membrane and ear canal normal.     Nose: Nose normal.  Eyes:     Conjunctiva/sclera: Conjunctivae normal.      Pupils: Pupils are equal, round, and reactive to light.  Neck:     Thyroid: No thyromegaly.  Cardiovascular:     Rate and Rhythm: Normal rate and regular rhythm.     Heart sounds: No murmur heard. Pulmonary:     Effort: Pulmonary effort is normal.     Breath sounds: Normal breath sounds. No rales.  Abdominal:     General: Bowel sounds are normal.     Palpations: Abdomen is soft.     Tenderness: There is no abdominal tenderness.  Musculoskeletal:        General: Normal range of motion.     Cervical back: Neck supple.  Lymphadenopathy:     Cervical: No cervical adenopathy.  Skin:    General: Skin is warm and dry.     Findings: No rash.  Neurological:     Mental Status: She is alert and oriented to person, place, and time.     Cranial Nerves: No cranial nerve deficit.     Deep Tendon Reflexes: Reflexes are normal and symmetric.  Psychiatric:        Mood and Affect: Mood normal.           Assessment & Plan:  Preventative health care Assessment & Plan: Immunizations UTD. Influenza vaccine provided today.  Mammogram UTD Bone density scan due and pending Colonoscopy UTD, declines given age  Discussed the importance of a healthy diet and regular exercise in order for weight loss, and to reduce the risk of further co-morbidity.  Exam stable. Labs pending.  Follow up in 1 year for repeat physical.    Coronary artery disease involving native coronary artery of native heart without angina pectoris Assessment & Plan: Asymptomatic.  Following with cardiology and atrial fibrillation clinic, reviewed office notes from August 2024  Continue Eliquis 5 mg BID, Imdur 90 mg daily, metoprolol succinate 100 mg daily,    Essential hypertension Assessment & Plan: Stable.  Continue metoprolol succinate 100 mg daily    Persistent atrial fibrillation [facility]) Assessment & Plan: Following with cardiology and atrial fibrillation.  Continue Eliquis 5 mg BID, Imdur 90 mg  daily, metoprolol succinate 100 mg daily,    Gastroesophageal reflux disease, unspecified whether esophagitis present Assessment & Plan: Overall stable  Continue pantoprazole 20 mg daily.   Type 2 diabetes mellitus with stage 3b chronic kidney disease, without long-term current use of insulin (HCC) Assessment & Plan: Repeat A1C pending.  Continue Glipizide XL 10 mg daily.  Follow up in 3-6 months based on A1C results.   Orders: -     Hemoglobin A1c -     Comprehensive metabolic panel  Osteopenia, unspecified location Assessment & Plan: Due, she has this scheduled.  Continue calcium and vitamin D.   Anxiety and depression Assessment & Plan: Controlled.  Continue sertraline 100 mg daily. Continue to monitor.   Constipation, unspecified constipation type Assessment & Plan: Controlled.  No  concerns today, Continue to monitor.    Chronic pain of both knees Assessment & Plan: Chronic and continued. Following with sport medicine.   Continue injections PRN. Continue gabapentin 100 mg 2-3 times daily    Urinary incontinence without sensory awareness Assessment & Plan: No improvement.  Increase Myrbetruiq to 50 mg daily. She will update.  Orders: -     Mirabegron ER; Take 1 tablet (50 mg total) by mouth daily. For urinary incontinence  Dispense: 90 tablet; Refill: 3  Mixed hyperlipidemia Assessment & Plan: Following with cardiology. Intolerant to multiple prior regimens.  Orders: -     Lipid panel  Other iron deficiency anemia Assessment & Plan: Repeat iron studies and CBC pending  Orders: -     CBC -     IBC + Ferritin  Encounter for immunization -     Flu Vaccine Trivalent High Dose (Fluad)        Doreene Nest, NP

## 2023-08-24 NOTE — Assessment & Plan Note (Signed)
Chronic and continued. Following with sport medicine.   Continue injections PRN. Continue gabapentin 100 mg 2-3 times daily

## 2023-08-24 NOTE — Assessment & Plan Note (Signed)
Controlled. ? ?Continue sertraline 100 mg daily. ?Continue to monitor.  ?

## 2023-08-24 NOTE — Assessment & Plan Note (Signed)
Following with cardiology and atrial fibrillation.  Continue Eliquis 5 mg BID, Imdur 90 mg daily, metoprolol succinate 100 mg daily,

## 2023-08-24 NOTE — Assessment & Plan Note (Addendum)
Asymptomatic.  Following with cardiology and atrial fibrillation clinic, reviewed office notes from August 2024  Continue Eliquis 5 mg BID, Imdur 90 mg daily, metoprolol succinate 100 mg daily,

## 2023-08-24 NOTE — Assessment & Plan Note (Signed)
Controlled.  No concerns today, Continue to monitor.

## 2023-08-24 NOTE — Patient Instructions (Addendum)
We increased the dose of your Myrbetriq bladder incontinence medication to 50 mg daily.  Stop by the lab prior to leaving today. I will notify you of your results once received.   Complete your bone density scan.  Please schedule a follow up visit for 6 months for a diabetes check.  It was a pleasure to see you today!'

## 2023-08-24 NOTE — Assessment & Plan Note (Signed)
Stable.  Continue metoprolol succinate 100 mg daily

## 2023-08-24 NOTE — Assessment & Plan Note (Signed)
Following with cardiology. Intolerant to multiple prior regimens.

## 2023-08-24 NOTE — Telephone Encounter (Signed)
Pt advised Dr. Gibson Ramp notes from 08/22/23.. she says that she has been feeling well and will let us know if anything changes. Otherwise she will keep her appt with the Opthalmologist on 09/06/23.

## 2023-08-24 NOTE — Telephone Encounter (Signed)
Pt trying to get an appt to see the doctor before her 10/08 eye appointment. Please advise

## 2023-08-24 NOTE — Assessment & Plan Note (Signed)
Repeat A1C pending.  Continue Glipizide XL 10 mg daily.  Follow up in 3-6 months based on A1C results.

## 2023-08-24 NOTE — Assessment & Plan Note (Signed)
No improvement.  Increase Myrbetruiq to 50 mg daily. She will update.

## 2023-08-24 NOTE — Assessment & Plan Note (Signed)
Repeat iron studies and CBC pending.

## 2023-08-24 NOTE — Assessment & Plan Note (Signed)
Overall stable  Continue pantoprazole 20 mg daily.

## 2023-08-24 NOTE — Assessment & Plan Note (Signed)
Immunizations UTD. Influenza vaccine provided today.  Mammogram UTD Bone density scan due and pending Colonoscopy UTD, declines given age  Discussed the importance of a healthy diet and regular exercise in order for weight loss, and to reduce the risk of further co-morbidity.  Exam stable. Labs pending.  Follow up in 1 year for repeat physical.

## 2023-08-25 DIAGNOSIS — E1122 Type 2 diabetes mellitus with diabetic chronic kidney disease: Secondary | ICD-10-CM

## 2023-08-25 MED ORDER — TRULICITY 0.75 MG/0.5ML ~~LOC~~ SOAJ
0.7500 mg | SUBCUTANEOUS | 1 refills | Status: DC
Start: 2023-08-25 — End: 2024-02-07

## 2023-09-06 ENCOUNTER — Telehealth: Payer: Self-pay | Admitting: Cardiovascular Disease

## 2023-09-06 NOTE — Telephone Encounter (Signed)
Spoke with patient and she was stating that she will be needing eye surgery but was informed she has AFIB and she was not aware of that. I did advise she was recently seen in AFIB clinic. Clearance form will be faxed over.Did advise once we receive form. Preop will review and give her a call

## 2023-09-06 NOTE — Telephone Encounter (Signed)
Patient is requesting to speak with a nurse in regards to her heart. Please advise.

## 2023-09-13 ENCOUNTER — Encounter: Payer: Self-pay | Admitting: Ophthalmology

## 2023-09-14 ENCOUNTER — Other Ambulatory Visit: Payer: Self-pay | Admitting: Primary Care

## 2023-09-14 DIAGNOSIS — K219 Gastro-esophageal reflux disease without esophagitis: Secondary | ICD-10-CM

## 2023-09-19 NOTE — Discharge Instructions (Signed)

## 2023-09-21 ENCOUNTER — Other Ambulatory Visit: Payer: Self-pay

## 2023-09-21 ENCOUNTER — Ambulatory Visit: Payer: Medicare PPO | Admitting: Anesthesiology

## 2023-09-21 ENCOUNTER — Encounter
Admission: RE | Disposition: A | Discharge status: Home / Self Care | Payer: Self-pay | Source: Home / Self Care | Attending: Ophthalmology

## 2023-09-21 ENCOUNTER — Ambulatory Visit
Admission: RE | Admit: 2023-09-21 | Discharge: 2023-09-21 | Disposition: A | Discharge status: Home / Self Care | Payer: Medicare PPO | Attending: Ophthalmology | Admitting: Ophthalmology

## 2023-09-21 DIAGNOSIS — I2510 Atherosclerotic heart disease of native coronary artery without angina pectoris: Secondary | ICD-10-CM | POA: Insufficient documentation

## 2023-09-21 DIAGNOSIS — N1832 Chronic kidney disease, stage 3b: Secondary | ICD-10-CM | POA: Insufficient documentation

## 2023-09-21 DIAGNOSIS — Z86718 Personal history of other venous thrombosis and embolism: Secondary | ICD-10-CM | POA: Insufficient documentation

## 2023-09-21 DIAGNOSIS — Z955 Presence of coronary angioplasty implant and graft: Secondary | ICD-10-CM | POA: Insufficient documentation

## 2023-09-21 DIAGNOSIS — Z7984 Long term (current) use of oral hypoglycemic drugs: Secondary | ICD-10-CM | POA: Diagnosis not present

## 2023-09-21 DIAGNOSIS — K219 Gastro-esophageal reflux disease without esophagitis: Secondary | ICD-10-CM | POA: Diagnosis not present

## 2023-09-21 DIAGNOSIS — E1122 Type 2 diabetes mellitus with diabetic chronic kidney disease: Secondary | ICD-10-CM | POA: Diagnosis not present

## 2023-09-21 DIAGNOSIS — I4891 Unspecified atrial fibrillation: Secondary | ICD-10-CM | POA: Diagnosis not present

## 2023-09-21 DIAGNOSIS — H401113 Primary open-angle glaucoma, right eye, severe stage: Secondary | ICD-10-CM | POA: Insufficient documentation

## 2023-09-21 DIAGNOSIS — Z7901 Long term (current) use of anticoagulants: Secondary | ICD-10-CM | POA: Diagnosis not present

## 2023-09-21 DIAGNOSIS — I129 Hypertensive chronic kidney disease with stage 1 through stage 4 chronic kidney disease, or unspecified chronic kidney disease: Secondary | ICD-10-CM | POA: Insufficient documentation

## 2023-09-21 DIAGNOSIS — Z7985 Long-term (current) use of injectable non-insulin antidiabetic drugs: Secondary | ICD-10-CM | POA: Insufficient documentation

## 2023-09-21 HISTORY — DX: Presence of dental prosthetic device (complete) (partial): Z97.2

## 2023-09-21 HISTORY — DX: Motion sickness, initial encounter: T75.3XXA

## 2023-09-21 HISTORY — DX: Presence of external hearing-aid: Z97.4

## 2023-09-21 HISTORY — DX: Unspecified atrial fibrillation: I48.91

## 2023-09-21 LAB — GLUCOSE, CAPILLARY: Glucose-Capillary: 137 mg/dL — ABNORMAL HIGH (ref 70–99)

## 2023-09-21 SURGERY — GONIOTOMY
Anesthesia: Topical | Site: Eye | Laterality: Right

## 2023-09-21 MED ORDER — FENTANYL CITRATE (PF) 100 MCG/2ML IJ SOLN
INTRAMUSCULAR | Status: AC
  Filled 2023-09-21: qty 2

## 2023-09-21 MED ORDER — ARMC OPHTHALMIC DILATING DROPS: 1.0000 | OPHTHALMIC | Status: DC | PRN

## 2023-09-21 MED ORDER — TETRACAINE HCL 0.5 % OP SOLN
OPHTHALMIC | Status: DC | PRN
Start: 2023-09-21 — End: 2023-09-21
  Administered 2023-09-21: 2 [drp] via OPHTHALMIC

## 2023-09-21 MED ORDER — NEOMYCIN-POLYMYXIN-DEXAMETH 3.5-10000-0.1 OP OINT
TOPICAL_OINTMENT | OPHTHALMIC | Status: DC | PRN
Start: 2023-09-21 — End: 2023-09-21
  Administered 2023-09-21: 1 via OPHTHALMIC

## 2023-09-21 MED ORDER — SIGHTPATH DOSE#1 BSS IO SOLN
INTRAOCULAR | Status: DC | PRN
  Administered 2023-09-21: 2 mL

## 2023-09-21 MED ORDER — SIGHTPATH DOSE#1 BSS IO SOLN
INTRAOCULAR | Status: DC | PRN
  Administered 2023-09-21: 38 mL via OPHTHALMIC

## 2023-09-21 MED ORDER — TETRACAINE HCL 0.5 % OP SOLN
1.0000 [drp] | OPHTHALMIC | Status: DC | PRN
  Administered 2023-09-21: 1 [drp] via OPHTHALMIC

## 2023-09-21 MED ORDER — ARMC OPHTHALMIC DILATING DROPS
OPHTHALMIC | Status: AC
  Filled 2023-09-21: qty 0.5

## 2023-09-21 MED ORDER — SIGHTPATH DOSE#1 BSS IO SOLN
INTRAOCULAR | Status: DC | PRN
  Administered 2023-09-21: 15 mL via INTRAOCULAR

## 2023-09-21 MED ORDER — SIGHTPATH DOSE#1 NA CHONDROIT SULF-NA HYALURON 20-15 MG/0.5ML IO SOSY
INTRAOCULAR | Status: DC | PRN
Start: 2023-09-21 — End: 2023-09-21
  Administered 2023-09-21: .5 mL via INTRAOCULAR

## 2023-09-21 MED ORDER — TETRACAINE HCL 0.5 % OP SOLN
OPHTHALMIC | Status: AC
  Filled 2023-09-21: qty 4

## 2023-09-21 MED ORDER — CEFUROXIME OPHTHALMIC INJECTION 1 MG/0.1 ML
INJECTION | OPHTHALMIC | Status: DC | PRN
  Administered 2023-09-21: .1 mL via INTRACAMERAL

## 2023-09-21 SURGICAL SUPPLY — 11 items
BLADE DUAL KAHOOK SINGLE USE (BLADE) IMPLANT
CANNULA ANT/CHMB 27G (MISCELLANEOUS) IMPLANT
CANNULA ANT/CHMB 27GA (MISCELLANEOUS)
CATARACT SUITE SIGHTPATH (MISCELLANEOUS) ×1
FEE CATARACT SUITE SIGHTPATH (MISCELLANEOUS) ×1 IMPLANT
GLOVE SRG 8 PF TXTR STRL LF DI (GLOVE) ×1 IMPLANT
GLOVE SURG ENC TEXT LTX SZ7.5 (GLOVE) ×1 IMPLANT
GLOVE SURG UNDER POLY LF SZ8 (GLOVE) ×1
NDL FILTER BLUNT 18X1 1/2 (NEEDLE) ×1 IMPLANT
NEEDLE FILTER BLUNT 18X1 1/2 (NEEDLE) ×1
SYR 3ML LL SCALE MARK (SYRINGE) ×1 IMPLANT

## 2023-09-21 NOTE — Anesthesia Preprocedure Evaluation (Addendum)
Anesthesia Evaluation  Patient identified by MRN, date of birth, ID band Patient awake    Reviewed: Allergy & Precautions, NPO status , Patient's Chart, lab work & pertinent test results  History of Anesthesia Complications Negative for: history of anesthetic complications  Airway        Dental   Pulmonary           Cardiovascular hypertension, + CAD (s/p stents on Eliquis)  + dysrhythmias (a fib, new onset, not yet evaluated by cardiology)  Rhythm:Irregular  PACs; hx DVT   Neuro/Psych  PSYCHIATRIC DISORDERS Anxiety Depression       GI/Hepatic ,GERD  ,,  Endo/Other  diabetes, Type 2    Renal/GU Renal disease (stage III CKD; nephrolithiasis)     Musculoskeletal  (+) Arthritis ,    Abdominal   Peds  Hematology  (+) Blood dyscrasia, anemia Non-Hodgkin lymphoma   Anesthesia Other Findings   Reproductive/Obstetrics                             Anesthesia Physical Anesthesia Plan  ASA: 3  Anesthesia Plan:    Post-op Pain Management:    Induction:   PONV Risk Score and Plan:   Airway Management Planned:   Additional Equipment:   Intra-op Plan:   Post-operative Plan:   Informed Consent:   Plan Discussed with:   Anesthesia Plan Comments: (Cataract was canceled last time in [place] due to new-onset a fib.  Patient tried to get an appointment at the a fib clinic but earliest she could get was November 2024.  She presents today for same procedure.  I asked her if she has been feeling well, but she says she has been feeling more fatigued.  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  I do not think it is safe for her to receive anesthesia today.  Her a fib is symptomatic and not worked up.  I offered for her to do the procedure without anesthesia and she agrees.  Dr. Inez Pilgrim aware of all above and agrees with plan as well.)        Anesthesia Quick Evaluation

## 2023-09-21 NOTE — Op Note (Signed)
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    COMPLICATIONS:  None.   DESCRIPTION OF PROCEDURE:  The patient was identified in the holding room and transported to the operating room and placed in the supine position under the operating microscope.  The right eye was identified as the operative eye and it was prepped and draped in the usual sterile ophthalmic fashion.   A 1 millimeter clear-corneal paracentesis was made at the 12:00 position.  0.5 ml of preservative-free 1% lidocaine was injected into the anterior chamber.  The anterior chamber was filled with Viscoat viscoelastic.  A 2.4 millimeter keratome was used to make a near-clear corneal incision at the 9:00 position. The microscope was adjusted and a gonioprism was used to visulaize the trabecular meshwork.  The [facility] Dual Blade was advanced across the anterior chamber under viscoelastic.  The blade was used to mark the trabecular meshwork at the 1:30 position.  The blade was placed two clock hours clockwise into the meshwork.  Proper postioning was confirmed.  The blade ws passed counterclockwise through the meshwork to excise approximately two to three clock-hours of trabecular meshwork.  The remaining viscoelastic was aspirated.   Wounds were hydrated with balanced salt solution.  The anterior chamber was inflated to a physiologic pressure with balanced salt solution.  No wound leaks were noted. Cefuroxime 0.1 ml of a 10mg /ml solution was injected into the anterior chamber for a dose of 1 mg of intracameral antibiotic  at the completion of the case.  The patient was taken to the recovery room in stable condition without complications of anesthesia or surgery.

## 2023-09-21 NOTE — OR Nursing (Signed)
Patient in the room at 1100.  1102: HR 90. O2 sat 92%, B/p 141/79(65) 1105 time out was performed 1105: HR 88. O2 sat 96%, B/p 154/84(105)  1110: HR 91, O2 sat 93%, B/p 147/91(108) 1115: HR 84. O2 sat 93%, B/p 136/94(108) Case End 1116

## 2023-09-21 NOTE — H&P (Signed)
[facility]   Primary Care Physician:  Doreene Nest, NP Ophthalmologist: Dr. Lockie Mola  Pre-Procedure History & Physical: HPI:  [name] [name] is a 89 y.o. female here for ophthalmic surgery.   Past Medical History:  Diagnosis Date   Acute bilateral thoracic back pain 08/16/2019   Allergy    Cipro, Plavix, Statins   Anemia    Arthritis    Atrial fibrillation (HCC)    CAD (coronary artery disease)    a. s/p tandem Promus DES to LAD in 2009 by Dr. Juanda Chance;  b.  LHC (03/22/14):  prox LAD 30%, mid LAD 40%, LAD stent ok with dist 20% ISR, apical LAD occluded with L-L collats filling apical vessel (too small for PCI), mid RCA 20%, EF 70%.  Med Rx   Cancer [facility])    Chronic kidney disease, stage 3b (HCC)    Colon polyps    Diabetes mellitus    Diagnosed 2012   Diverticulosis of colon (without mention of hemorrhage)    DVT (deep venous thrombosis) (HCC) 12/2022   right leg   GERD (gastroesophageal reflux disease)    Glaucoma    Grade I diastolic dysfunction    Grade I diastolic dysfunction    Herpes zoster without complication 04/24/2020   Hyperlipidemia    intol of statins   Hypertension    Kidney stones    Left atrial dilation    Moderate aortic regurgitation    Motion sickness    back seat cars   NHL (non-Hodgkin's lymphoma) (HCC) dx'd 2003   chemo/xrt comp 2003   Personal history of colonic polyps    Proctitis    Pruritic intertrigo 07/18/2014   Urticaria    Wears dentures    Full upper, partial lower   Wears hearing aid in both ears     Past Surgical History:  Procedure Laterality Date   cardiac cath-neg     CATARACT EXTRACTION Bilateral    CORONARY ANGIOPLASTY WITH STENT PLACEMENT     x 2   dexa-neg     KNEE SURGERY  10/30/2003   left   LEFT HEART CATHETERIZATION WITH CORONARY ANGIOGRAM N/A 03/22/2014   Procedure: LEFT HEART CATHETERIZATION WITH CORONARY ANGIOGRAM;  Surgeon: Kathleene Hazel, MD;  Location: [facility] CATH LAB;  Service:  Cardiovascular;  Laterality: N/A;   stress cardiolite     VAGINAL HYSTERECTOMY     partial, fibroids, one ovary left    Prior to Admission medications   Medication Sig Start Date End Date Taking? Authorizing Provider  Bempedoic Acid-Ezetimibe (NEXLIZET) 180-10 MG TABS Take 1 tablet by mouth daily. 04/13/23  Yes Kathleene Hazel, MD  brimonidine (ALPHAGAN) 0.2 % ophthalmic solution Place 1 drop into both eyes in the morning and at bedtime.   Yes [provider]  Cholecalciferol (VITAMIN D-3) 1000 UNITS CAPS Take 1 capsule by mouth daily.   Yes [provider]  diclofenac Sodium (VOLTAREN) 1 % GEL APPLY 2 GRAMS TOPICALLY THREE TIMES A DAY AS NEEDED 02/05/23  Yes Doreene Nest, NP  dorzolamide-timolol (COSOPT) 2-0.5 % ophthalmic solution Place 1 drop into both eyes 2 (two) times daily. 05/09/23  Yes [provider]  Dulaglutide (TRULICITY) 0.75 MG/0.5ML SOPN Inject 0.75 mg into the skin once a week. for diabetes. 08/25/23  Yes Doreene Nest, NP  ELIQUIS 5 MG TABS tablet TAKE 1 TABLET TWICE DAILY 06/28/23  Yes Kathleene Hazel, MD  ferrous sulfate 325 (65 FE) MG tablet Take 1 tablet (325  mg total) by mouth daily with breakfast. 10/28/22  Yes Meredith Pel, NP  gabapentin (NEURONTIN) 100 MG capsule Take 1 capsule (100 mg total) by mouth 3 (three) times daily. 05/11/23  Yes Felecia Shelling, DPM  glipiZIDE (GLUCOTROL XL) 10 MG 24 hr tablet Take 1 tablet (10 mg total) by mouth daily with breakfast. for diabetes. 04/13/23  Yes Doreene Nest, NP  isosorbide mononitrate (IMDUR) 60 MG 24 hr tablet Take 1.5 tablets (90 mg total) by mouth daily. 08/24/23  Yes Kathleene Hazel, MD  metoprolol succinate (TOPROL-XL) 100 MG 24 hr tablet Take one (1) tablet by mouth (100 mg) daily. Take with or immediately following a meal. 04/19/23  Yes Kathleene Hazel, MD  mirabegron ER (MYRBETRIQ) 50 MG TB24 tablet Take 1 tablet (50 mg total) by mouth daily. For  urinary incontinence 08/24/23  Yes Doreene Nest, NP  nitroGLYCERIN (NITROSTAT) 0.4 MG SL tablet Place 1 tablet (0.4 mg total) under the tongue every 5 (five) minutes as needed for chest pain. 04/21/23  Yes Kathleene Hazel, MD  ROCKLATAN 0.02-0.005 % SOLN Place 1 drop into the right eye at bedtime. 04/16/23  Yes [provider]  sertraline (ZOLOFT) 100 MG tablet TAKE 1 TABLET BY MOUTH DAILY FOR ANXIETY AND DEPRESSION 07/10/23  Yes Doreene Nest, NP  vitamin B-12 (CYANOCOBALAMIN) 1000 MCG tablet Take 1,000 mcg by mouth daily.   Yes [provider]  Blood Glucose Monitoring Suppl DEVI 1 each by Does not apply route in the morning, at noon, and at bedtime. May substitute to any manufacturer covered by patient's insurance. 02/16/23   Doreene Nest, NP  fexofenadine (ALLEGRA) 180 MG tablet Take 1 tablet (180 mg total) by mouth as needed for allergies or rhinitis. 04/03/19   Doreene Nest, NP  Glucose Blood (BLOOD GLUCOSE TEST STRIPS) STRP 1 each by In Vitro route in the morning, at noon, and at bedtime. May substitute to any manufacturer covered by patient's insurance. 02/16/23   Doreene Nest, NP  Lancets Misc. MISC 1 each by Does not apply route in the morning, at noon, and at bedtime. May substitute to any manufacturer covered by patient's insurance. 02/16/23   Doreene Nest, NP  pantoprazole (PROTONIX) 20 MG tablet TAKE 1 TABLET BY MOUTH DAILY FOR HEARTBURN 09/14/23   Doreene Nest, NP  triamcinolone cream (KENALOG) 0.1 % Apply 1 Application topically 2 (two) times daily. 08/19/23   Felecia Shelling, DPM  loratadine (CLARITIN) 10 MG tablet Take 10 mg by mouth as needed.   02/15/12  [provider]    Allergies as of 09/09/2023 - Review Complete 08/24/2023  Allergen Reaction Noted   Other Rash and Anaphylaxis 03/04/2015   Shellfish allergy Anaphylaxis and Rash 12/10/2011   Cephalexin  01/10/2008   Ciprofloxacin     Clopidogrel bisulfate      Codeine phosphate  01/24/2007   Ezetimibe-simvastatin  01/24/2007   Hydrocod poli-chlorphe poli er     Lidocaine Hives 11/13/2015   Nitrofurantoin     Pregabalin     Propoxyphene n-acetaminophen     Rofecoxib     Sulfamethoxazole  01/24/2007   Sulfonamide derivatives  01/24/2007   Celecoxib Rash 02/17/2015   Codeine Rash 02/17/2015   Hydrocod poli-chlorphe poli er Rash 03/04/2015   Propoxyphene Rash 03/04/2015   Sulfa antibiotics Rash 02/17/2015    Family History  Problem Relation Age of Onset   Cancer Mother  jaw   Hypertension Father    Heart attack Father    Heart attack Sister    Cancer Sister        unknown primary-mets throughout body   Colon cancer Neg Hx    Esophageal cancer Neg Hx    Pancreatic cancer Neg Hx    Stomach cancer Neg Hx    Liver disease Neg Hx     Social History   Socioeconomic History   Marital status: Widowed    Spouse name: Not on file   Number of children: 0   Years of education: Not on file   Highest education level: 12th grade  Occupational History   Occupation: Retired    Associate Professor: RETIRED   Occupation: retired  Tobacco Use   Smoking status: Never   Smokeless tobacco: Never  Vaping Use   Vaping status: Never Used  Substance and Sexual Activity   Alcohol use: No   Drug use: No   Sexual activity: Not Currently  Other Topics Concern   Not on file  Social History Narrative   Not on file   Social Determinants of Health   Financial Resource Strain: Low Risk  (02/16/2023)   Overall Financial Resource Strain (CARDIA)    Difficulty of Paying Living Expenses: Not hard at all  Food Insecurity: No Food Insecurity (02/16/2023)   Hunger Vital Sign    Worried About Running Out of Food in the Last Year: Never true    Ran Out of Food in the Last Year: Never true  Transportation Needs: No Transportation Needs (02/16/2023)   PRAPARE - Administrator, Civil Service (Medical): No    Lack of Transportation (Non-Medical): No   Physical Activity: Inactive (06/25/2022)   Exercise Vital Sign    Days of Exercise per Week: 0 days    Minutes of Exercise per Session: 0 min  Stress: No Stress Concern Present (06/25/2022)   [employer] of Occupational Health - Occupational Stress Questionnaire    Feeling of Stress : Not at all  Social Connections: Moderately Integrated (02/16/2023)   Social Connection and Isolation Panel [NHANES]    Frequency of Communication with Friends and Family: More than three times a week    Frequency of Social Gatherings with Friends and Family: Once a week    Attends Religious Services: More than 4 times per year    Active Member of [employer] or Organizations: Yes    Attends Banker Meetings: More than 4 times per year    Marital Status: Widowed  Intimate Partner Violence: Not At Risk (06/23/2021)   Humiliation, Afraid, Rape, and Kick questionnaire    Fear of Current or Ex-Partner: No    Emotionally Abused: No    Physically Abused: No    Sexually Abused: No    Review of Systems: See HPI, otherwise negative ROS  Physical Exam: BP (!) 131/95   Temp 97.8 F (36.6 C) (Temporal)   Resp (!) 22   Ht 5\' 9"  (1.753 m)   Wt 74.6 kg   SpO2 98%   BMI 24.28 kg/m  General:   Alert,  pleasant and cooperative in NAD Head:  Normocephalic and atraumatic. Lungs:  Clear to auscultation.    Heart:  Regular rate and irreg rhythm.   Impression/Plan: Sammuel Cooper is here for ophthalmic surgery.  Risks, benefits, limitations, and alternatives regarding ophthalmic surgery have been reviewed with the patient.  Questions have been answered.  All parties agreeable.   Lockie Mola, MD  09/21/2023, 9:57 AM

## 2023-09-30 ENCOUNTER — Ambulatory Visit: Payer: Medicare PPO | Admitting: Primary Care

## 2023-09-30 ENCOUNTER — Encounter: Payer: Self-pay | Admitting: Primary Care

## 2023-09-30 VITALS — BP 128/76 | HR 71 | Temp 97.3°F | Ht 69.0 in | Wt 167.0 lb

## 2023-09-30 DIAGNOSIS — R5382 Chronic fatigue, unspecified: Secondary | ICD-10-CM | POA: Diagnosis not present

## 2023-09-30 DIAGNOSIS — I4819 Other persistent atrial fibrillation: Secondary | ICD-10-CM

## 2023-09-30 NOTE — Progress Notes (Signed)
Shoes / inserts in were taken to [place] office

## 2023-09-30 NOTE — Progress Notes (Signed)
Subjective:    Patient ID: [name] [name], female    DOB: 1932/04/18, 89 y.o.   MRN: [id number]  HPI  [name] [name] is a very pleasant 89 y.o. female with a history of hypertension, CAD, mitral regurgitation, DVT, persistent atrial fibrillation, type 2 diabetes, renal insufficiency, chronic fatigue, anxiety depression, insomnia, urine incontinence, lower extremity edema, chronic knee pain, iron deficiency anemia who presents today to discuss fatigue.  History of chronic fatigue, but over the last two weeks she's noticed increased fatigue. She's noticed having to sit and take breaks in between house hold chores. After resting for 30 minutes she's able to get up and complete her chores.   She completed glaucoma eye surgery to the right eye about 10 days ago, stent was placed for fluid removal. Had the same surgery on her left side in July 2024, had no problems. Over the last 2 days she's noticed darkness to the right eye each time she rises from a seated position.   She wears a smart watch, doesn't track her HR much, today her HR was 95. Diagnosed with atrial fibrillation in July 2024, evaluated by the atrial fibrillation clinic in September 2024, was decided to continue with rate control and Eliquis. Currently managed on Eliquis 5 mg BID and metoprolol succinate 100 mg. Follows with cardiology, has an office visit scheduled with cardiology for later this month.   She denies fevers, cough, rectal bleeding, dysuria, urinary frequency, lower extremity edema, cough.  BP Readings from Last 3 Encounters:  09/30/23 128/76  09/21/23 (!) 143/78  08/24/23 (!) 146/82      Review of Systems  Constitutional:  Positive for fatigue. Negative for chills and fever.  Respiratory:  Negative for cough and shortness of breath.   Cardiovascular:  Negative for chest pain and leg swelling.         Past Medical History:  Diagnosis Date   Acute bilateral thoracic back pain 08/16/2019   Allergy    Cipro,  Plavix, Statins   Anemia    Arthritis    Atrial fibrillation (HCC)    CAD (coronary artery disease)    a. s/p tandem Promus DES to LAD in 2009 by Dr. Juanda Chance;  b.  LHC (03/22/14):  prox LAD 30%, mid LAD 40%, LAD stent ok with dist 20% ISR, apical LAD occluded with L-L collats filling apical vessel (too small for PCI), mid RCA 20%, EF 70%.  Med Rx   Cancer [facility])    Chronic kidney disease, stage 3b (HCC)    Colon polyps    Diabetes mellitus    Diagnosed 2012   Diverticulosis of colon (without mention of hemorrhage)    DVT (deep venous thrombosis) (HCC) 12/2022   right leg   GERD (gastroesophageal reflux disease)    Glaucoma    Grade I diastolic dysfunction    Grade I diastolic dysfunction    Herpes zoster without complication 04/24/2020   Hyperlipidemia    intol of statins   Hypertension    Kidney stones    Left atrial dilation    Moderate aortic regurgitation    Motion sickness    back seat cars   NHL (non-Hodgkin's lymphoma) (HCC) dx'd 2003   chemo/xrt comp 2003   Personal history of colonic polyps    Proctitis    Pruritic intertrigo 07/18/2014   Urticaria    Wears dentures    Full upper, partial lower   Wears hearing aid in both ears     Social History  Socioeconomic History   Marital status: Widowed    Spouse name: Not on file   Number of children: 0   Years of education: Not on file   Highest education level: 12th grade  Occupational History   Occupation: Retired    Associate Professor: RETIRED   Occupation: retired  Tobacco Use   Smoking status: Never   Smokeless tobacco: Never  Vaping Use   Vaping status: Never Used  Substance and Sexual Activity   Alcohol use: No   Drug use: No   Sexual activity: Not Currently  Other Topics Concern   Not on file  Social History Narrative   Not on file   Social Determinants of Health   Financial Resource Strain: Low Risk  (09/29/2023)   Overall Financial Resource Strain (CARDIA)    Difficulty of Paying Living Expenses: Not  hard at all  Food Insecurity: No Food Insecurity (09/29/2023)   Hunger Vital Sign    Worried About Running Out of Food in the Last Year: Never true    Ran Out of Food in the Last Year: Never true  Transportation Needs: No Transportation Needs (09/29/2023)   PRAPARE - Administrator, Civil Service (Medical): No    Lack of Transportation (Non-Medical): No  Physical Activity: Unknown (09/29/2023)   Exercise Vital Sign    Days of Exercise per Week: 0 days    Minutes of Exercise per Session: Not on file  Stress: No Stress Concern Present (09/29/2023)   [employer] of Occupational Health - Occupational Stress Questionnaire    Feeling of Stress : Not at all  Social Connections: Moderately Integrated (09/29/2023)   Social Connection and Isolation Panel [NHANES]    Frequency of Communication with Friends and Family: More than three times a week    Frequency of Social Gatherings with Friends and Family: More than three times a week    Attends Religious Services: More than 4 times per year    Active Member of [employer] or Organizations: Yes    Attends Banker Meetings: More than 4 times per year    Marital Status: Widowed  Intimate Partner Violence: Not At Risk (06/23/2021)   Humiliation, Afraid, Rape, and Kick questionnaire    Fear of Current or Ex-Partner: No    Emotionally Abused: No    Physically Abused: No    Sexually Abused: No    Past Surgical History:  Procedure Laterality Date   cardiac cath-neg     CATARACT EXTRACTION Bilateral    CORONARY ANGIOPLASTY WITH STENT PLACEMENT     x 2   dexa-neg     KNEE SURGERY  10/30/2003   left   LEFT HEART CATHETERIZATION WITH CORONARY ANGIOGRAM N/A 03/22/2014   Procedure: LEFT HEART CATHETERIZATION WITH CORONARY ANGIOGRAM;  Surgeon: Kathleene Hazel, MD;  Location: [facility] CATH LAB;  Service: Cardiovascular;  Laterality: N/A;   stress cardiolite     VAGINAL HYSTERECTOMY     partial, fibroids, one ovary left     Family History  Problem Relation Age of Onset   Cancer Mother        jaw   Hypertension Father    Heart attack Father    Heart attack Sister    Cancer Sister        unknown primary-mets throughout body   Colon cancer Neg Hx    Esophageal cancer Neg Hx    Pancreatic cancer Neg Hx    Stomach cancer Neg Hx    Liver disease Neg  Hx     Allergies  Allergen Reactions   Other Rash and Anaphylaxis   Shellfish Allergy Anaphylaxis and Rash   Cephalexin     REACTION: trash and swelling   Ciprofloxacin     REACTION: u/k   Clopidogrel Bisulfate     REACTION: swelling, rash   Ezetimibe-Simvastatin     REACTION: myalgias   Lidocaine Hives    ONLY TO ADHESIVE LIDOCAINE PATCHES. NO PROBLEM WITH INJECTABLE LIDOCAINE.   Nitrofurantoin     REACTION: [place]   Pregabalin     REACTION: felt bad   Celecoxib Rash    REACTION: [place]   Codeine Rash   Codeine Phosphate Rash   Hydrocod Poli-Chlorphe Poli Er Rash   Propoxyphene Rash   Propoxyphene N-Acetaminophen Rash   Rofecoxib Rash   Sulfa Antibiotics Rash   Sulfamethoxazole Rash   Sulfonamide Derivatives Rash    Current Outpatient Medications on File Prior to Visit  Medication Sig Dispense Refill   Bempedoic Acid-Ezetimibe (NEXLIZET) 180-10 MG TABS Take 1 tablet by mouth daily. 90 tablet 3   Blood Glucose Monitoring Suppl DEVI 1 each by Does not apply route in the morning, at noon, and at bedtime. May substitute to any manufacturer covered by patient's insurance. 1 each 0   brimonidine (ALPHAGAN) 0.2 % ophthalmic solution Place 1 drop into both eyes in the morning and at bedtime.     Cholecalciferol (VITAMIN D-3) 1000 UNITS CAPS Take 1 capsule by mouth daily.     diclofenac Sodium (VOLTAREN) 1 % GEL APPLY 2 GRAMS TOPICALLY THREE TIMES A DAY AS NEEDED 100 g 2   dorzolamide-timolol (COSOPT) 2-0.5 % ophthalmic solution Place 1 drop into both eyes 2 (two) times daily.     Dulaglutide (TRULICITY) 0.75 MG/0.5ML SOPN Inject 0.75 mg into the skin  once a week. for diabetes. 6 mL 1   ELIQUIS 5 MG TABS tablet TAKE 1 TABLET TWICE DAILY 180 tablet 3   ferrous sulfate 325 (65 FE) MG tablet Take 1 tablet (325 mg total) by mouth daily with breakfast. 30 tablet 2   fexofenadine (ALLEGRA) 180 MG tablet Take 1 tablet (180 mg total) by mouth as needed for allergies or rhinitis. 90 tablet 0   gabapentin (NEURONTIN) 100 MG capsule Take 1 capsule (100 mg total) by mouth 3 (three) times daily. 90 capsule 3   glipiZIDE (GLUCOTROL XL) 10 MG 24 hr tablet Take 1 tablet (10 mg total) by mouth daily with breakfast. for diabetes. 90 tablet 1   Glucose Blood (BLOOD GLUCOSE TEST STRIPS) STRP 1 each by In Vitro route in the morning, at noon, and at bedtime. May substitute to any manufacturer covered by patient's insurance. 300 strip 5   isosorbide mononitrate (IMDUR) 60 MG 24 hr tablet Take 1.5 tablets (90 mg total) by mouth daily. 135 tablet 1   Lancets Misc. MISC 1 each by Does not apply route in the morning, at noon, and at bedtime. May substitute to any manufacturer covered by patient's insurance. 300 each 5   metoprolol succinate (TOPROL-XL) 100 MG 24 hr tablet Take one (1) tablet by mouth (100 mg) daily. Take with or immediately following a meal. 90 tablet 3   mirabegron ER (MYRBETRIQ) 50 MG TB24 tablet Take 1 tablet (50 mg total) by mouth daily. For urinary incontinence 90 tablet 3   nitroGLYCERIN (NITROSTAT) 0.4 MG SL tablet Place 1 tablet (0.4 mg total) under the tongue every 5 (five) minutes as needed for chest pain. 75 tablet 2  pantoprazole (PROTONIX) 20 MG tablet TAKE 1 TABLET BY MOUTH DAILY FOR HEARTBURN 90 tablet 2   ROCKLATAN 0.02-0.005 % SOLN Place 1 drop into the right eye at bedtime.     sertraline (ZOLOFT) 100 MG tablet TAKE 1 TABLET BY MOUTH DAILY FOR ANXIETY AND DEPRESSION 90 tablet 0   triamcinolone cream (KENALOG) 0.1 % Apply 1 Application topically 2 (two) times daily. 30 g 1   vitamin B-12 (CYANOCOBALAMIN) 1000 MCG tablet Take 1,000 mcg by  mouth daily.     [DISCONTINUED] loratadine (CLARITIN) 10 MG tablet Take 10 mg by mouth as needed.      No current facility-administered medications on file prior to visit.    BP 128/76   Pulse 71   Temp (!) 97.3 F (36.3 C) (Temporal)   Ht 5\' 9"  (1.753 m)   Wt 167 lb (75.8 kg)   SpO2 97%   BMI 24.66 kg/m  Objective:   Physical Exam Cardiovascular:     Rate and Rhythm: Normal rate. Rhythm irregular.     Comments: No longer extremity edema Pulmonary:     Effort: Pulmonary effort is normal.     Breath sounds: Normal breath sounds.  Musculoskeletal:     Cervical back: Neck supple.  Skin:    General: Skin is warm and dry.  Neurological:     Mental Status: She is alert and oriented to person, place, and time.  Psychiatric:        Mood and Affect: Mood normal.           Assessment & Plan:  Chronic fatigue Assessment & Plan: Recent increased symptoms likely secondary to atrial fibrillation. She is noted to be in atrial fibrillation rate controlled.  Another differential includes polypharmacy given her extensive medication list.  Discussed this with her today.  Checking labs today to rule out other causes. Urinalysis ordered and pending.  Follow up with cardiology as scheduled.  Orders: -     VITAMIN D 25 Hydroxy (Vit-D Deficiency, Fractures) -     Vitamin B12 -     CBC -     Basic metabolic panel -     TSH -     POCT Urinalysis Dipstick (Automated)  Persistent atrial fibrillation [facility]) Assessment & Plan: Noted on exam today.  Rate controlled.  Continue metoprolol succinate 100 mg daily, apixaban 5 mg twice daily. Follow-up with cardiology as scheduled.         Doreene Nest, NP

## 2023-09-30 NOTE — Assessment & Plan Note (Signed)
Noted on exam today.  Rate controlled.  Continue metoprolol succinate 100 mg daily, apixaban 5 mg twice daily. Follow-up with cardiology as scheduled.

## 2023-09-30 NOTE — Patient Instructions (Signed)
Stop by the lab prior to leaving today. I will notify you of your results once received.   Follow-up with cardiology as scheduled.  It was a pleasure to see you today!

## 2023-09-30 NOTE — Assessment & Plan Note (Signed)
Recent increased symptoms likely secondary to atrial fibrillation. She is noted to be in atrial fibrillation rate controlled.  Another differential includes polypharmacy given her extensive medication list.  Discussed this with her today.  Checking labs today to rule out other causes. Urinalysis ordered and pending.  Follow up with cardiology as scheduled.

## 2023-09-30 NOTE — Addendum Note (Signed)
Addended by: Alvina Chou on: 09/30/2023 03:22 PM   Modules accepted: Orders

## 2023-10-01 LAB — BASIC METABOLIC PANEL
BUN/Creatinine Ratio: 17 (calc) (ref 6–22)
BUN: 22 mg/dL (ref 7–25)
CO2: 27 mmol/L (ref 20–32)
Calcium: 9.2 mg/dL (ref 8.6–10.4)
Chloride: 105 mmol/L (ref 98–110)
Creat: 1.31 mg/dL — ABNORMAL HIGH (ref 0.60–0.95)
Glucose, Bld: 106 mg/dL — ABNORMAL HIGH (ref 65–99)
Potassium: 5 mmol/L (ref 3.5–5.3)
Sodium: 139 mmol/L (ref 135–146)

## 2023-10-01 LAB — CBC
HCT: 31.8 % — ABNORMAL LOW (ref 35.0–45.0)
Hemoglobin: 10.8 g/dL — ABNORMAL LOW (ref 11.7–15.5)
MCH: 33.3 pg — ABNORMAL HIGH (ref 27.0–33.0)
MCHC: 34 g/dL (ref 32.0–36.0)
MCV: 98.1 fL (ref 80.0–100.0)
MPV: 9 fL (ref 7.5–12.5)
Platelets: 265 10*3/uL (ref 140–400)
RBC: 3.24 10*6/uL — ABNORMAL LOW (ref 3.80–5.10)
RDW: 12.7 % (ref 11.0–15.0)
WBC: 4.3 10*3/uL (ref 3.8–10.8)

## 2023-10-01 LAB — VITAMIN B12: Vitamin B-12: 1034 pg/mL (ref 200–1100)

## 2023-10-01 LAB — VITAMIN D 25 HYDROXY (VIT D DEFICIENCY, FRACTURES): Vit D, 25-Hydroxy: 33 ng/mL (ref 30–100)

## 2023-10-01 LAB — BRAIN NATRIURETIC PEPTIDE: Brain Natriuretic Peptide: 494 pg/mL — ABNORMAL HIGH (ref <100)

## 2023-10-01 LAB — TSH: TSH: 1.48 m[IU]/L (ref 0.40–4.50)

## 2023-10-02 ENCOUNTER — Other Ambulatory Visit: Payer: Self-pay | Admitting: Primary Care

## 2023-10-02 DIAGNOSIS — D649 Anemia, unspecified: Secondary | ICD-10-CM

## 2023-10-02 DIAGNOSIS — R7989 Other specified abnormal findings of blood chemistry: Secondary | ICD-10-CM

## 2023-10-02 DIAGNOSIS — R5382 Chronic fatigue, unspecified: Secondary | ICD-10-CM

## 2023-10-02 MED ORDER — FUROSEMIDE 20 MG PO TABS: ORAL_TABLET | ORAL | 0 refills | Status: DC

## 2023-10-05 ENCOUNTER — Other Ambulatory Visit: Payer: Self-pay | Admitting: Primary Care

## 2023-10-05 DIAGNOSIS — R5382 Chronic fatigue, unspecified: Secondary | ICD-10-CM

## 2023-10-05 DIAGNOSIS — R7989 Other specified abnormal findings of blood chemistry: Secondary | ICD-10-CM

## 2023-10-06 ENCOUNTER — Ambulatory Visit: Payer: Medicare PPO

## 2023-10-07 ENCOUNTER — Ambulatory Visit: Payer: Medicare PPO | Admitting: Primary Care

## 2023-10-09 ENCOUNTER — Emergency Department: Payer: Medicare PPO

## 2023-10-09 ENCOUNTER — Other Ambulatory Visit: Payer: Self-pay

## 2023-10-09 ENCOUNTER — Other Ambulatory Visit: Payer: Self-pay | Admitting: Primary Care

## 2023-10-09 ENCOUNTER — Inpatient Hospital Stay: Payer: Medicare PPO

## 2023-10-09 ENCOUNTER — Inpatient Hospital Stay
Admission: EM | Admit: 2023-10-09 | Discharge: 2023-10-15 | DRG: 812 | Disposition: A | Discharge status: Home / Self Care | Payer: Medicare PPO | Attending: Internal Medicine | Admitting: Internal Medicine

## 2023-10-09 DIAGNOSIS — I951 Orthostatic hypotension: Secondary | ICD-10-CM | POA: Diagnosis not present

## 2023-10-09 DIAGNOSIS — Z86718 Personal history of other venous thrombosis and embolism: Secondary | ICD-10-CM | POA: Diagnosis not present

## 2023-10-09 DIAGNOSIS — Z90721 Acquired absence of ovaries, unilateral: Secondary | ICD-10-CM

## 2023-10-09 DIAGNOSIS — Z8249 Family history of ischemic heart disease and other diseases of the circulatory system: Secondary | ICD-10-CM | POA: Diagnosis not present

## 2023-10-09 DIAGNOSIS — K5790 Diverticulosis of intestine, part unspecified, without perforation or abscess without bleeding: Secondary | ICD-10-CM | POA: Diagnosis not present

## 2023-10-09 DIAGNOSIS — R55 Syncope and collapse: Secondary | ICD-10-CM

## 2023-10-09 DIAGNOSIS — Z923 Personal history of irradiation: Secondary | ICD-10-CM | POA: Diagnosis not present

## 2023-10-09 DIAGNOSIS — R195 Other fecal abnormalities: Secondary | ICD-10-CM | POA: Diagnosis present

## 2023-10-09 DIAGNOSIS — Z90711 Acquired absence of uterus with remaining cervical stump: Secondary | ICD-10-CM

## 2023-10-09 DIAGNOSIS — K317 Polyp of stomach and duodenum: Secondary | ICD-10-CM | POA: Diagnosis not present

## 2023-10-09 DIAGNOSIS — Z7984 Long term (current) use of oral hypoglycemic drugs: Secondary | ICD-10-CM

## 2023-10-09 DIAGNOSIS — I82409 Acute embolism and thrombosis of unspecified deep veins of unspecified lower extremity: Secondary | ICD-10-CM | POA: Diagnosis present

## 2023-10-09 DIAGNOSIS — E538 Deficiency of other specified B group vitamins: Secondary | ICD-10-CM | POA: Diagnosis present

## 2023-10-09 DIAGNOSIS — Z8669 Personal history of other diseases of the nervous system and sense organs: Secondary | ICD-10-CM | POA: Diagnosis not present

## 2023-10-09 DIAGNOSIS — Z91013 Allergy to seafood: Secondary | ICD-10-CM

## 2023-10-09 DIAGNOSIS — Z9842 Cataract extraction status, left eye: Secondary | ICD-10-CM

## 2023-10-09 DIAGNOSIS — Z87442 Personal history of urinary calculi: Secondary | ICD-10-CM

## 2023-10-09 DIAGNOSIS — K3189 Other diseases of stomach and duodenum: Secondary | ICD-10-CM | POA: Diagnosis not present

## 2023-10-09 DIAGNOSIS — S199XXA Unspecified injury of neck, initial encounter: Secondary | ICD-10-CM | POA: Diagnosis not present

## 2023-10-09 DIAGNOSIS — I4891 Unspecified atrial fibrillation: Secondary | ICD-10-CM | POA: Diagnosis not present

## 2023-10-09 DIAGNOSIS — K641 Second degree hemorrhoids: Secondary | ICD-10-CM | POA: Diagnosis not present

## 2023-10-09 DIAGNOSIS — K648 Other hemorrhoids: Secondary | ICD-10-CM | POA: Diagnosis not present

## 2023-10-09 DIAGNOSIS — J329 Chronic sinusitis, unspecified: Secondary | ICD-10-CM | POA: Diagnosis not present

## 2023-10-09 DIAGNOSIS — I129 Hypertensive chronic kidney disease with stage 1 through stage 4 chronic kidney disease, or unspecified chronic kidney disease: Secondary | ICD-10-CM | POA: Diagnosis not present

## 2023-10-09 DIAGNOSIS — H3320 Serous retinal detachment, unspecified eye: Secondary | ICD-10-CM | POA: Diagnosis present

## 2023-10-09 DIAGNOSIS — I2510 Atherosclerotic heart disease of native coronary artery without angina pectoris: Secondary | ICD-10-CM | POA: Diagnosis present

## 2023-10-09 DIAGNOSIS — D509 Iron deficiency anemia, unspecified: Secondary | ICD-10-CM | POA: Diagnosis not present

## 2023-10-09 DIAGNOSIS — Z79899 Other long term (current) drug therapy: Secondary | ICD-10-CM

## 2023-10-09 DIAGNOSIS — F419 Anxiety disorder, unspecified: Secondary | ICD-10-CM | POA: Diagnosis present

## 2023-10-09 DIAGNOSIS — I4819 Other persistent atrial fibrillation: Secondary | ICD-10-CM | POA: Diagnosis present

## 2023-10-09 DIAGNOSIS — E785 Hyperlipidemia, unspecified: Secondary | ICD-10-CM | POA: Diagnosis not present

## 2023-10-09 DIAGNOSIS — D122 Benign neoplasm of ascending colon: Secondary | ICD-10-CM | POA: Diagnosis present

## 2023-10-09 DIAGNOSIS — D62 Acute posthemorrhagic anemia: Secondary | ICD-10-CM | POA: Diagnosis not present

## 2023-10-09 DIAGNOSIS — Z6379 Other stressful life events affecting family and household: Secondary | ICD-10-CM

## 2023-10-09 DIAGNOSIS — Z888 Allergy status to other drugs, medicaments and biological substances status: Secondary | ICD-10-CM

## 2023-10-09 DIAGNOSIS — S0990XA Unspecified injury of head, initial encounter: Secondary | ICD-10-CM | POA: Diagnosis not present

## 2023-10-09 DIAGNOSIS — E663 Overweight: Secondary | ICD-10-CM | POA: Diagnosis present

## 2023-10-09 DIAGNOSIS — S01112A Laceration without foreign body of left eyelid and periocular area, initial encounter: Secondary | ICD-10-CM | POA: Diagnosis not present

## 2023-10-09 DIAGNOSIS — S0083XA Contusion of other part of head, initial encounter: Secondary | ICD-10-CM

## 2023-10-09 DIAGNOSIS — Z7901 Long term (current) use of anticoagulants: Secondary | ICD-10-CM

## 2023-10-09 DIAGNOSIS — K635 Polyp of colon: Secondary | ICD-10-CM | POA: Diagnosis not present

## 2023-10-09 DIAGNOSIS — Z884 Allergy status to anesthetic agent status: Secondary | ICD-10-CM

## 2023-10-09 DIAGNOSIS — K5730 Diverticulosis of large intestine without perforation or abscess without bleeding: Secondary | ICD-10-CM | POA: Diagnosis not present

## 2023-10-09 DIAGNOSIS — Z7985 Long-term (current) use of injectable non-insulin antidiabetic drugs: Secondary | ICD-10-CM

## 2023-10-09 DIAGNOSIS — Z860100 Personal history of colon polyps, unspecified: Secondary | ICD-10-CM

## 2023-10-09 DIAGNOSIS — Z9841 Cataract extraction status, right eye: Secondary | ICD-10-CM

## 2023-10-09 DIAGNOSIS — N1832 Chronic kidney disease, stage 3b: Secondary | ICD-10-CM | POA: Diagnosis present

## 2023-10-09 DIAGNOSIS — K219 Gastro-esophageal reflux disease without esophagitis: Secondary | ICD-10-CM | POA: Diagnosis present

## 2023-10-09 DIAGNOSIS — R040 Epistaxis: Secondary | ICD-10-CM | POA: Diagnosis present

## 2023-10-09 DIAGNOSIS — Z955 Presence of coronary angioplasty implant and graft: Secondary | ICD-10-CM

## 2023-10-09 DIAGNOSIS — S0993XA Unspecified injury of face, initial encounter: Secondary | ICD-10-CM | POA: Diagnosis not present

## 2023-10-09 DIAGNOSIS — W19XXXA Unspecified fall, initial encounter: Secondary | ICD-10-CM | POA: Diagnosis present

## 2023-10-09 DIAGNOSIS — Z8572 Personal history of non-Hodgkin lymphomas: Secondary | ICD-10-CM

## 2023-10-09 DIAGNOSIS — Z6824 Body mass index (BMI) 24.0-24.9, adult: Secondary | ICD-10-CM

## 2023-10-09 DIAGNOSIS — R58 Hemorrhage, not elsewhere classified: Secondary | ICD-10-CM | POA: Diagnosis not present

## 2023-10-09 DIAGNOSIS — E1122 Type 2 diabetes mellitus with diabetic chronic kidney disease: Secondary | ICD-10-CM | POA: Diagnosis not present

## 2023-10-09 DIAGNOSIS — I10 Essential (primary) hypertension: Secondary | ICD-10-CM | POA: Diagnosis not present

## 2023-10-09 DIAGNOSIS — R4020 Unspecified coma: Principal | ICD-10-CM

## 2023-10-09 DIAGNOSIS — Z9221 Personal history of antineoplastic chemotherapy: Secondary | ICD-10-CM

## 2023-10-09 DIAGNOSIS — S060X1A Concussion with loss of consciousness of 30 minutes or less, initial encounter: Secondary | ICD-10-CM | POA: Diagnosis not present

## 2023-10-09 DIAGNOSIS — Z974 Presence of external hearing-aid: Secondary | ICD-10-CM

## 2023-10-09 DIAGNOSIS — Z882 Allergy status to sulfonamides status: Secondary | ICD-10-CM

## 2023-10-09 DIAGNOSIS — Z885 Allergy status to narcotic agent status: Secondary | ICD-10-CM

## 2023-10-09 DIAGNOSIS — Z85048 Personal history of other malignant neoplasm of rectum, rectosigmoid junction, and anus: Secondary | ICD-10-CM

## 2023-10-09 HISTORY — DX: Syncope and collapse: R55

## 2023-10-09 LAB — CBC WITH DIFFERENTIAL/PLATELET
Abs Immature Granulocytes: 0.04 10*3/uL (ref 0.00–0.07)
Basophils Absolute: 0 10*3/uL (ref 0.0–0.1)
Basophils Relative: 0 %
Eosinophils Absolute: 0.1 10*3/uL (ref 0.0–0.5)
Eosinophils Relative: 1 %
HCT: 28.2 % — ABNORMAL LOW (ref 36.0–46.0)
Hemoglobin: 9.2 g/dL — ABNORMAL LOW (ref 12.0–15.0)
Immature Granulocytes: 1 %
Lymphocytes Relative: 22 %
Lymphs Abs: 1.4 10*3/uL (ref 0.7–4.0)
MCH: 33.5 pg (ref 26.0–34.0)
MCHC: 32.6 g/dL (ref 30.0–36.0)
MCV: 102.5 fL — ABNORMAL HIGH (ref 80.0–100.0)
Monocytes Absolute: 0.5 10*3/uL (ref 0.1–1.0)
Monocytes Relative: 7 %
Neutro Abs: 4.3 10*3/uL (ref 1.7–7.7)
Neutrophils Relative %: 69 %
Platelets: 267 10*3/uL (ref 150–400)
RBC: 2.75 MIL/uL — ABNORMAL LOW (ref 3.87–5.11)
RDW: 13.9 % (ref 11.5–15.5)
WBC: 6.3 10*3/uL (ref 4.0–10.5)
nRBC: 0.5 % — ABNORMAL HIGH (ref 0.0–0.2)

## 2023-10-09 LAB — COMPREHENSIVE METABOLIC PANEL
ALT: 10 U/L (ref 0–44)
AST: 18 U/L (ref 15–41)
Albumin: 3.6 g/dL (ref 3.5–5.0)
Alkaline Phosphatase: 24 U/L — ABNORMAL LOW (ref 38–126)
Anion gap: 8 (ref 5–15)
BUN: 52 mg/dL — ABNORMAL HIGH (ref 8–23)
CO2: 25 mmol/L (ref 22–32)
Calcium: 8.5 mg/dL — ABNORMAL LOW (ref 8.9–10.3)
Chloride: 104 mmol/L (ref 98–111)
Creatinine, Ser: 1.33 mg/dL — ABNORMAL HIGH (ref 0.44–1.00)
GFR, Estimated: 38 mL/min — ABNORMAL LOW (ref >60)
Glucose, Bld: 197 mg/dL — ABNORMAL HIGH (ref 70–99)
Potassium: 4.7 mmol/L (ref 3.5–5.1)
Sodium: 137 mmol/L (ref 135–145)
Total Bilirubin: 0.6 mg/dL (ref <1.2)
Total Protein: 6.4 g/dL — ABNORMAL LOW (ref 6.5–8.1)

## 2023-10-09 LAB — T4, FREE: Free T4: 0.86 ng/dL (ref 0.61–1.12)

## 2023-10-09 LAB — TROPONIN I (HIGH SENSITIVITY)
Troponin I (High Sensitivity): 9 ng/L (ref <18)
Troponin I (High Sensitivity): 10 ng/L (ref <18)

## 2023-10-09 LAB — TSH: TSH: 1.483 u[IU]/mL (ref 0.350–4.500)

## 2023-10-09 LAB — BRAIN NATRIURETIC PEPTIDE: B Natriuretic Peptide: 363.8 pg/mL — ABNORMAL HIGH (ref 0.0–100.0)

## 2023-10-09 MED ORDER — PANTOPRAZOLE SODIUM 40 MG IV SOLR
40.0000 mg | Freq: Two times a day (BID) | INTRAVENOUS | Status: DC
  Administered 2023-10-10 – 2023-10-14 (×10): 40 mg via INTRAVENOUS
  Filled 2023-10-09 (×11): qty 10

## 2023-10-09 MED ORDER — SERTRALINE HCL 50 MG PO TABS
100.0000 mg | ORAL_TABLET | Freq: Every day | ORAL | Status: DC
  Administered 2023-10-10 – 2023-10-14 (×5): 100 mg via ORAL
  Filled 2023-10-09 (×5): qty 2

## 2023-10-09 MED ORDER — FERROUS SULFATE 325 (65 FE) MG PO TABS
325.0000 mg | ORAL_TABLET | Freq: Every day | ORAL | Status: DC
  Administered 2023-10-10: 325 mg via ORAL
  Filled 2023-10-09: qty 1

## 2023-10-09 MED ORDER — GABAPENTIN 100 MG PO CAPS
100.0000 mg | ORAL_CAPSULE | Freq: Three times a day (TID) | ORAL | Status: DC
  Administered 2023-10-10 – 2023-10-15 (×16): 100 mg via ORAL
  Filled 2023-10-09 (×16): qty 1

## 2023-10-09 MED ORDER — DORZOLAMIDE HCL-TIMOLOL MAL 2-0.5 % OP SOLN
1.0000 [drp] | Freq: Two times a day (BID) | OPHTHALMIC | Status: DC
  Administered 2023-10-12 – 2023-10-13 (×4): 1 [drp] via OPHTHALMIC
  Filled 2023-10-09 (×2): qty 10

## 2023-10-09 MED ORDER — LIDOCAINE HCL (PF) 1 % IJ SOLN
5.0000 mL | Freq: Once | INTRAMUSCULAR | Status: AC
  Administered 2023-10-09: 5 mL via INTRADERMAL
  Filled 2023-10-09: qty 5

## 2023-10-09 MED ORDER — ONDANSETRON 4 MG PO TBDP: 8.0000 mg | ORAL_TABLET | Freq: Once | ORAL | Status: DC

## 2023-10-09 MED ORDER — SODIUM CHLORIDE 0.9% FLUSH
3.0000 mL | Freq: Two times a day (BID) | INTRAVENOUS | Status: DC
  Administered 2023-10-10 – 2023-10-13 (×6): 3 mL via INTRAVENOUS

## 2023-10-09 MED ORDER — ONDANSETRON HCL 4 MG/2ML IJ SOLN
4.0000 mg | Freq: Once | INTRAMUSCULAR | Status: AC
Start: 2023-10-09 — End: 2023-10-09
  Administered 2023-10-09: 4 mg via INTRAVENOUS
  Filled 2023-10-09: qty 2

## 2023-10-09 MED ORDER — LACTATED RINGERS IV BOLUS
1000.0000 mL | Freq: Once | INTRAVENOUS | Status: AC
Start: 2023-10-09 — End: 2023-10-09
  Administered 2023-10-09: 1000 mL via INTRAVENOUS

## 2023-10-09 MED ORDER — LORATADINE 10 MG PO TABS
10.0000 mg | ORAL_TABLET | Freq: Every day | ORAL | Status: DC
  Administered 2023-10-10 – 2023-10-15 (×6): 10 mg via ORAL
  Filled 2023-10-09 (×6): qty 1

## 2023-10-09 MED ORDER — BISACODYL 5 MG PO TBEC: 5.0000 mg | DELAYED_RELEASE_TABLET | Freq: Every day | ORAL | Status: DC | PRN

## 2023-10-09 MED ORDER — METOPROLOL TARTRATE 25 MG PO TABS: 25.0000 mg | ORAL_TABLET | Freq: Two times a day (BID) | ORAL | Status: DC

## 2023-10-09 MED ORDER — ONDANSETRON HCL 4 MG PO TABS
4.0000 mg | ORAL_TABLET | Freq: Four times a day (QID) | ORAL | Status: DC | PRN
  Administered 2023-10-13: 4 mg via ORAL
  Filled 2023-10-09: qty 1

## 2023-10-09 MED ORDER — SODIUM CHLORIDE 0.9 % IV BOLUS
250.0000 mL | Freq: Once | INTRAVENOUS | Status: AC
  Administered 2023-10-09: 250 mL via INTRAVENOUS

## 2023-10-09 MED ORDER — ONDANSETRON HCL 4 MG/2ML IJ SOLN: 4.0000 mg | Freq: Four times a day (QID) | INTRAMUSCULAR | Status: DC | PRN

## 2023-10-09 MED ORDER — BRIMONIDINE TARTRATE 0.2 % OP SOLN
1.0000 [drp] | Freq: Three times a day (TID) | OPHTHALMIC | Status: DC
  Administered 2023-10-10 – 2023-10-13 (×10): 1 [drp] via OPHTHALMIC
  Filled 2023-10-09: qty 5

## 2023-10-09 MED ORDER — APIXABAN 5 MG PO TABS: 5.0000 mg | ORAL_TABLET | Freq: Two times a day (BID) | ORAL | Status: DC

## 2023-10-09 MED ORDER — ONDANSETRON HCL 4 MG/2ML IJ SOLN
4.0000 mg | Freq: Once | INTRAMUSCULAR | Status: AC
  Administered 2023-10-09: 4 mg via INTRAVENOUS
  Filled 2023-10-09: qty 2

## 2023-10-09 MED ORDER — POLYETHYLENE GLYCOL 3350 17 G PO PACK: 17.0000 g | PACK | Freq: Every day | ORAL | Status: DC | PRN

## 2023-10-09 MED ORDER — ACETAMINOPHEN 325 MG PO TABS
650.0000 mg | ORAL_TABLET | Freq: Once | ORAL | Status: AC
  Administered 2023-10-09: 650 mg via ORAL
  Filled 2023-10-09: qty 2

## 2023-10-09 MED ORDER — LACTATED RINGERS IV SOLN: INTRAVENOUS | Status: AC

## 2023-10-09 NOTE — Assessment & Plan Note (Signed)
Swallow evaluation, glycemic protocol.  Accu-Cheks depending on patient's swallow results if patient fails swallow eval then we will do Accu-Cheks every 4 if patient passes swallow eval we will do Premeal 3 times daily.

## 2023-10-09 NOTE — ED Triage Notes (Signed)
Pt from home. Has been taking Eliqus 25mg  BID for Afib. Patient has also been taking Lasix. EMS states that she was going to and from the bathroom a lot earlier today and fell around 1300 and was laying on the floor for a while. Patient does not remember falling. Patient hit her head on the left side and is currently bleeding. AOX4  EMS: AOX4 20G LFA Medic truck cleared C-spine.  4mg  Zofran 1550 given pre hospital

## 2023-10-09 NOTE — Assessment & Plan Note (Addendum)
Patient presenting with loss of consciousness episodes and syncope presentation and collapse.  Initial stat evaluation on arrival with a head CT noncontrast is negative for any acute intracranial abnormalities.  Vitals show low blood pressure suspect orthostasis may have a component in this. We will admit to progressive cardiac unit monitor patient's heart rhythm and rate and further evaluate for other additional possibilities which in this case may be TIA, medication related, hypoglycemia, PE less likely as patient is on anticoagulation, GI bleed. Additional supportive care measures fall precautions n.p.o. except meds, orthostatic vitals.  Suspect this is more likely a nonneurological syncope.

## 2023-10-09 NOTE — Assessment & Plan Note (Addendum)
Will obtain a stool guaiac.  IV PPI.  Follow CBC.  GI consult as deemed necessary.  Type and screen.  N.p.o. after midnight except meds. Most recent GI note is in April 2024, per report patient has declined endoscopy and colonoscopy unless absolutely necessary was started on iron supplementation therapy with continued ongoing anticoagulation. Hold will discuss with patient about the need for evaluation of ongoing chronic blood loss that may be worsening causing syncope in addition to chronic iron deficiency which may be putting strain in her heart as well.  Putting her at high risk for decompensation and heart failure.

## 2023-10-09 NOTE — Discharge Instructions (Addendum)
Resume your eliquis from Monday Nov 18th, 2024--since you had biopsies take during your procedure

## 2023-10-09 NOTE — Assessment & Plan Note (Signed)
Vitals:   10/09/23 1622 10/09/23 1900 10/09/23 2000  BP: 129/85 (!) 104/58 122/66  Hold Imdur 90 mg, metoprolol started at 25 p.o. twice daily.

## 2023-10-09 NOTE — ED Notes (Signed)
Patient stating she is sensitive to light and has neck pain. Patient also stating she is very tired. RN turned off lights and made patient comfortable in bed with towel against left forehead that patient fell on to stop bleeding.

## 2023-10-09 NOTE — Assessment & Plan Note (Signed)
No reports of chest discomfort or anginal equivalent symptoms, troponin within normal limit x 2 along with EKG being nonischemic.  Will admit and follow in telemetry progressive unit.  2D echocardiogram as deemed appropriate.

## 2023-10-09 NOTE — Assessment & Plan Note (Signed)
MCV 102.5, will check a B12 level.

## 2023-10-09 NOTE — ED Notes (Signed)
Dr Renaldo Reel at bedside to evaluate pt for admission. Pt remains A&O x 4 and is eating a snack of saltines and ginger ale. No further dizziness at this time.

## 2023-10-09 NOTE — H&P (Signed)
History and Physical    Patient: [name] [name] MVH:[id number] DOB: 1932/09/10 DOA: 10/09/2023 DOS: the patient was seen and examined on 10/09/2023 PCP: Doreene Nest, NP  Patient coming from: Home  Chief Complaint:  Chief Complaint  Patient presents with   Fall    HPI: [name] [name] is a 89 y.o. female with medical history significant for allergies to 2 medications please see full list, CHF, retinal detachment being followed by ophthalmology, new onset A-fib earlier this year on Eliquis, iron deficiency anemia concerning for GI blood loss with fecal occult positive stools, being followed by [facility] for upper and lower GI endoscopy ,Type 2 diabetes, hypertension, hyperlipidemia, and CKD who presents after a syncopal episode today while in her bathroom.  Patient was standing up after using the commode and fell on the left side striking her left lateral eyebrow causing a laceration.  Patient did lose her consciousness and when she woke up she was in a pool of blood around her eye.  No reports of any trouble with vision speech issues weakness paralysis incontinence chest pain palpitations.  Initial vitals showed a heart rate of 102 blood pressure 129/85 afebrile 97.7 O2 sats of 98% on room air with respiratory rate of 17.  EKG does show A-fib at 85 on arrival.  In emergency room vitals trend shows: Vitals:   10/09/23 1623 10/09/23 1900 10/09/23 1925 10/09/23 2000  BP:  (!) 104/58  122/66  Pulse:  97  93  Temp:   98.2 F (36.8 C)   Resp:  15  18  Height: 5\' 9"  (1.753 m)     Weight: 75.3 kg     SpO2:  99%  96%  TempSrc:      BMI (Calculated): 24.5     Labs are notable for : Metabolic panel shows CKD stage IIIb with 0.33 and EGFR of 38, glucose of 197 last A1c of 7.9 in September, normal electrolytes and LFTs otherwise. BNP today is 363.8, troponin of 9 and repeat troponin of 10.  EKG shows A-fib at 85 on initial EKG. CBC shows chronic anemia with a hemoglobin mildly reduced at 9.2  MCV of 102.5 platelet normal and white count normal.  In the ED pt received: Medications  brimonidine (ALPHAGAN) 0.2 % ophthalmic solution 1 drop (has no administration in time range)  dorzolamide-timolol (COSOPT) 2-0.5 % ophthalmic solution 1 drop (has no administration in time range)  apixaban (ELIQUIS) tablet 5 mg (has no administration in time range)  loratadine (CLARITIN) tablet 10 mg (has no administration in time range)  ferrous sulfate tablet 325 mg (has no administration in time range)  gabapentin (NEURONTIN) capsule 100 mg (has no administration in time range)  sertraline (ZOLOFT) tablet 100 mg (has no administration in time range)  metoprolol tartrate (LOPRESSOR) tablet 25 mg (has no administration in time range)  sodium chloride flush (NS) 0.9 % injection 3 mL (has no administration in time range)  lactated ringers infusion (has no administration in time range)  polyethylene glycol (MIRALAX / GLYCOLAX) packet 17 g (has no administration in time range)  bisacodyl (DULCOLAX) EC tablet 5 mg (has no administration in time range)  ondansetron (ZOFRAN) tablet 4 mg (has no administration in time range)    Or  ondansetron (ZOFRAN) injection 4 mg (has no administration in time range)  ondansetron (ZOFRAN) injection 4 mg (4 mg Intravenous Given 10/09/23 1639)  lidocaine (PF) (XYLOCAINE) 1 % injection 5 mL (5 mLs Intradermal Given 10/09/23 1819)  ondansetron (  ZOFRAN) injection 4 mg (4 mg Intravenous Given 10/09/23 2046)  lactated ringers bolus 1,000 mL (1,000 mLs Intravenous New Bag/Given 10/09/23 2042)  acetaminophen (TYLENOL) tablet 650 mg (650 mg Oral Given 10/09/23 2143)   Review of Systems  Neurological:  Positive for dizziness and loss of consciousness.  All other systems reviewed and are negative.  Past Medical History:  Diagnosis Date   Acute bilateral thoracic back pain 08/16/2019   Allergy    Cipro, Plavix, Statins   Anemia    Arthritis    Atrial fibrillation (HCC)     CAD (coronary artery disease)    a. s/p tandem Promus DES to LAD in 2009 by Dr. Juanda Chance;  b.  LHC (03/22/14):  prox LAD 30%, mid LAD 40%, LAD stent ok with dist 20% ISR, apical LAD occluded with L-L collats filling apical vessel (too small for PCI), mid RCA 20%, EF 70%.  Med Rx   Cancer [facility])    Chronic kidney disease, stage 3b (HCC)    Colon polyps    Diabetes mellitus    Diagnosed 2012   Diverticulosis of colon (without mention of hemorrhage)    DVT (deep venous thrombosis) (HCC) 12/2022   right leg   GERD (gastroesophageal reflux disease)    Glaucoma    Grade I diastolic dysfunction    Grade I diastolic dysfunction    Herpes zoster without complication 04/24/2020   Hyperlipidemia    intol of statins   Hypertension    Kidney stones    Left atrial dilation    Moderate aortic regurgitation    Motion sickness    back seat cars   NHL (non-Hodgkin's lymphoma) (HCC) dx'd 2003   chemo/xrt comp 2003   Personal history of colonic polyps    Proctitis    Pruritic intertrigo 07/18/2014   Urticaria    Wears dentures    Full upper, partial lower   Wears hearing aid in both ears    Past Surgical History:  Procedure Laterality Date   cardiac cath-neg     CATARACT EXTRACTION Bilateral    CORONARY ANGIOPLASTY WITH STENT PLACEMENT     x 2   dexa-neg     KNEE SURGERY  10/30/2003   left   LEFT HEART CATHETERIZATION WITH CORONARY ANGIOGRAM N/A 03/22/2014   Procedure: LEFT HEART CATHETERIZATION WITH CORONARY ANGIOGRAM;  Surgeon: Kathleene Hazel, MD;  Location: [facility] CATH LAB;  Service: Cardiovascular;  Laterality: N/A;   stress cardiolite     VAGINAL HYSTERECTOMY     partial, fibroids, one ovary left    reports that she has never smoked. She has never used smokeless tobacco. She reports that she does not drink alcohol and does not use drugs.  Allergies  Allergen Reactions   Other Rash and Anaphylaxis   Shellfish Allergy Anaphylaxis and Rash   Cephalexin     REACTION: trash and  swelling   Ciprofloxacin     REACTION: u/k   Clopidogrel Bisulfate     REACTION: swelling, rash   Ezetimibe-Simvastatin     REACTION: myalgias   Lidocaine Hives    ONLY TO ADHESIVE LIDOCAINE PATCHES. NO PROBLEM WITH INJECTABLE LIDOCAINE.   Nitrofurantoin     REACTION: [place]   Pregabalin     REACTION: felt bad   Celecoxib Rash    REACTION: [place]   Codeine Rash   Codeine Phosphate Rash   Hydrocod Poli-Chlorphe Poli Er Rash   Propoxyphene Rash   Propoxyphene N-Acetaminophen Rash   Rofecoxib Rash  Sulfa Antibiotics Rash   Sulfamethoxazole Rash   Sulfonamide Derivatives Rash    Family History  Problem Relation Age of Onset   Cancer Mother        jaw   Hypertension Father    Heart attack Father    Heart attack Sister    Cancer Sister        unknown primary-mets throughout body   Colon cancer Neg Hx    Esophageal cancer Neg Hx    Pancreatic cancer Neg Hx    Stomach cancer Neg Hx    Liver disease Neg Hx     Prior to Admission medications   Medication Sig Start Date End Date Taking? Authorizing Provider  Bempedoic Acid-Ezetimibe (NEXLIZET) 180-10 MG TABS Take 1 tablet by mouth daily. 04/13/23   Kathleene Hazel, MD  Blood Glucose Monitoring Suppl DEVI 1 each by Does not apply route in the morning, at noon, and at bedtime. May substitute to any manufacturer covered by patient's insurance. 02/16/23   Doreene Nest, NP  brimonidine (ALPHAGAN) 0.2 % ophthalmic solution Place 1 drop into both eyes in the morning and at bedtime.    [provider]  Cholecalciferol (VITAMIN D-3) 1000 UNITS CAPS Take 1 capsule by mouth daily.    [provider]  diclofenac Sodium (VOLTAREN) 1 % GEL APPLY 2 GRAMS TOPICALLY THREE TIMES A DAY AS NEEDED 02/05/23   Doreene Nest, NP  dorzolamide-timolol (COSOPT) 2-0.5 % ophthalmic solution Place 1 drop into both eyes 2 (two) times daily. 05/09/23   [provider]  Dulaglutide (TRULICITY) 0.75 MG/0.5ML SOPN Inject 0.75 mg  into the skin once a week. for diabetes. 08/25/23   Doreene Nest, NP  ELIQUIS 5 MG TABS tablet TAKE 1 TABLET TWICE DAILY 06/28/23   Kathleene Hazel, MD  ferrous sulfate 325 (65 FE) MG tablet Take 1 tablet (325 mg total) by mouth daily with breakfast. 10/28/22   Meredith Pel, NP  fexofenadine (ALLEGRA) 180 MG tablet Take 1 tablet (180 mg total) by mouth as needed for allergies or rhinitis. 04/03/19   Doreene Nest, NP  furosemide (LASIX) 20 MG tablet Take 2 tablets by mouth every morning for 3-5 days for fluid build up. 10/02/23   Doreene Nest, NP  gabapentin (NEURONTIN) 100 MG capsule Take 1 capsule (100 mg total) by mouth 3 (three) times daily. 05/11/23   Felecia Shelling, DPM  glipiZIDE (GLUCOTROL XL) 10 MG 24 hr tablet Take 1 tablet (10 mg total) by mouth daily with breakfast. for diabetes. 04/13/23   Doreene Nest, NP  Glucose Blood (BLOOD GLUCOSE TEST STRIPS) STRP 1 each by In Vitro route in the morning, at noon, and at bedtime. May substitute to any manufacturer covered by patient's insurance. 02/16/23   Doreene Nest, NP  isosorbide mononitrate (IMDUR) 60 MG 24 hr tablet Take 1.5 tablets (90 mg total) by mouth daily. 08/24/23   Kathleene Hazel, MD  Lancets Misc. MISC 1 each by Does not apply route in the morning, at noon, and at bedtime. May substitute to any manufacturer covered by patient's insurance. 02/16/23   Doreene Nest, NP  metoprolol succinate (TOPROL-XL) 100 MG 24 hr tablet Take one (1) tablet by mouth (100 mg) daily. Take with or immediately following a meal. 04/19/23   Kathleene Hazel, MD  mirabegron ER (MYRBETRIQ) 50 MG TB24 tablet Take 1 tablet (50 mg total) by mouth daily. For urinary incontinence 08/24/23   Vernona Rieger  K, NP  nitroGLYCERIN (NITROSTAT) 0.4 MG SL tablet Place 1 tablet (0.4 mg total) under the tongue every 5 (five) minutes as needed for chest pain. 04/21/23   Kathleene Hazel, MD  pantoprazole (PROTONIX) 20  MG tablet TAKE 1 TABLET BY MOUTH DAILY FOR HEARTBURN 09/14/23   Doreene Nest, NP  ROCKLATAN 0.02-0.005 % SOLN Place 1 drop into the right eye at bedtime. 04/16/23   [provider]  sertraline (ZOLOFT) 100 MG tablet TAKE 1 TABLET BY MOUTH DAILY FOR ANXIETY AND DEPRESSION 10/09/23   Doreene Nest, NP  triamcinolone cream (KENALOG) 0.1 % Apply 1 Application topically 2 (two) times daily. 08/19/23   Felecia Shelling, DPM  vitamin B-12 (CYANOCOBALAMIN) 1000 MCG tablet Take 1,000 mcg by mouth daily.    [provider]  loratadine (CLARITIN) 10 MG tablet Take 10 mg by mouth as needed.   02/15/12  [provider]    Vitals:   10/09/23 1623 10/09/23 1900 10/09/23 1925 10/09/23 2000  BP:  (!) 104/58  122/66  Pulse:  97  93  Resp:  15  18  Temp:   98.2 F (36.8 C)   TempSrc:      SpO2:  99%  96%  Weight: 75.3 kg     Height: 5\' 9"  (1.753 m)      Physical Exam Vitals and nursing note reviewed.  Constitutional:      General: She is not in acute distress. HENT:     Head: Normocephalic and atraumatic.     Right Ear: Hearing normal.     Left Ear: Hearing normal.     Nose: Nose normal. No nasal deformity.     Mouth/Throat:     Lips: Pink.     Tongue: No lesions.     Pharynx: Oropharynx is clear.  Eyes:     General: Lids are normal.     Extraocular Movements: Extraocular movements intact.  Cardiovascular:     Rate and Rhythm: Normal rate. Rhythm irregular.     Heart sounds: Normal heart sounds.  Pulmonary:     Effort: Pulmonary effort is normal.     Breath sounds: Normal breath sounds.  Abdominal:     General: Bowel sounds are normal. There is no distension.     Palpations: Abdomen is soft. There is no mass.     Tenderness: There is no abdominal tenderness.  Musculoskeletal:     Right lower leg: No edema.     Left lower leg: No edema.  Skin:    General: Skin is warm.  Neurological:     General: No focal deficit present.     Mental Status: She is  alert and oriented to person, place, and time.     Cranial Nerves: Cranial nerves 2-12 are intact.  Psychiatric:        Attention and Perception: Attention normal.        Mood and Affect: Mood normal.        Speech: Speech normal.        Behavior: Behavior normal. Behavior is cooperative.      Labs on Admission: I have personally reviewed following labs and imaging studies Results for orders placed or performed during the hospital encounter of 10/09/23 (from the past 24 hour(s))  Type and screen [facility]     Status: None   Collection Time: 10/09/23  4:20 PM  Result Value Ref Range   ABO/RH(D) A POS    Antibody Screen NEG  Sample Expiration      10/12/2023,2359 Performed at [facility], [address] [place], [place] [address]   Comprehensive metabolic panel     Status: Abnormal   Collection Time: 10/09/23  4:34 PM  Result Value Ref Range   Sodium 137 135 - 145 mmol/L   Potassium 4.7 3.5 - 5.1 mmol/L   Chloride 104 98 - 111 mmol/L   CO2 25 22 - 32 mmol/L   Glucose, Bld 197 (H) 70 - 99 mg/dL   BUN 52 (H) 8 - 23 mg/dL   Creatinine, Ser 6.21 (H) 0.44 - 1.00 mg/dL   Calcium 8.5 (L) 8.9 - 10.3 mg/dL   Total Protein 6.4 (L) 6.5 - 8.1 g/dL   Albumin 3.6 3.5 - 5.0 g/dL   AST 18 15 - 41 U/L   ALT 10 0 - 44 U/L   Alkaline Phosphatase 24 (L) 38 - 126 U/L   Total Bilirubin 0.6 <1.2 mg/dL   GFR, Estimated 38 (L) >60 mL/min   Anion gap 8 5 - 15  CBC with Differential     Status: Abnormal   Collection Time: 10/09/23  4:34 PM  Result Value Ref Range   WBC 6.3 4.0 - 10.5 K/uL   RBC 2.75 (L) 3.87 - 5.11 MIL/uL   Hemoglobin 9.2 (L) 12.0 - 15.0 g/dL   HCT 30.8 (L) 65.7 - 84.6 %   MCV 102.5 (H) 80.0 - 100.0 fL   MCH 33.5 26.0 - 34.0 pg   MCHC 32.6 30.0 - 36.0 g/dL   RDW 96.2 95.2 - 84.1 %   Platelets 267 150 - 400 K/uL   nRBC 0.5 (H) 0.0 - 0.2 %   Neutrophils Relative % 69 %   Neutro Abs 4.3 1.7 - 7.7 K/uL   Lymphocytes Relative 22 %   Lymphs  Abs 1.4 0.7 - 4.0 K/uL   Monocytes Relative 7 %   Monocytes Absolute 0.5 0.1 - 1.0 K/uL   Eosinophils Relative 1 %   Eosinophils Absolute 0.1 0.0 - 0.5 K/uL   Basophils Relative 0 %   Basophils Absolute 0.0 0.0 - 0.1 K/uL   Immature Granulocytes 1 %   Abs Immature Granulocytes 0.04 0.00 - 0.07 K/uL  Troponin I (High Sensitivity)     Status: None   Collection Time: 10/09/23  4:34 PM  Result Value Ref Range   Troponin I (High Sensitivity) 9 <18 ng/L  Brain natriuretic peptide     Status: Abnormal   Collection Time: 10/09/23  4:34 PM  Result Value Ref Range   B Natriuretic Peptide 363.8 (H) 0.0 - 100.0 pg/mL  Troponin I (High Sensitivity)     Status: None   Collection Time: 10/09/23  6:29 PM  Result Value Ref Range   Troponin I (High Sensitivity) 10 <18 ng/L    CBC: Recent Labs  Lab 10/09/23 1634  WBC 6.3  NEUTROABS 4.3  HGB 9.2*  HCT 28.2*  MCV 102.5*  PLT 267   Basic Metabolic Panel: Recent Labs  Lab 10/09/23 1634  NA 137  K 4.7  CL 104  CO2 25  GLUCOSE 197*  BUN 52*  CREATININE 1.33*  CALCIUM 8.5*   GFR: Estimated Creatinine Clearance: 30 mL/min (A) (by C-G formula based on SCr of 1.33 mg/dL (H)). Liver Function Tests: Recent Labs  Lab 10/09/23 1634  AST 18  ALT 10  ALKPHOS 24*  BILITOT 0.6  PROT 6.4*  ALBUMIN 3.6   No results for input(s): "LIPASE", "AMYLASE" in  the last 168 hours. No results for input(s): "AMMONIA" in the last 168 hours. Coagulation Profile: No results for input(s): "INR", "PROTIME" in the last 168 hours. Cardiac Enzymes: No results for input(s): "CKTOTAL", "CKMB", "CKMBINDEX", "TROPONINI" in the last 168 hours. BNP (last 3 results) No results for input(s): "PROBNP" in the last 8760 hours. HbA1C: No results for input(s): "HGBA1C" in the last 72 hours. CBG: No results for input(s): "GLUCAP" in the last 168 hours. Lipid Profile: No results for input(s): "CHOL", "HDL", "LDLCALC", "TRIG", "CHOLHDL", "LDLDIRECT" in the last 72  hours. Thyroid Function Tests: No results for input(s): "TSH", "T4TOTAL", "FREET4", "T3FREE", "THYROIDAB" in the last 72 hours. Anemia Panel: No results for input(s): "VITAMINB12", "FOLATE", "FERRITIN", "TIBC", "IRON", "RETICCTPCT" in the last 72 hours. Urinalysis    Component Value Date/Time   COLORURINE AMBER (A) 04/30/2021 1801   APPEARANCEUR CLEAR (A) 04/30/2021 1801   LABSPEC 1.027 04/30/2021 1801   PHURINE 5.0 04/30/2021 1801   GLUCOSEU >=500 (A) 04/30/2021 1801   HGBUR NEGATIVE 04/30/2021 1801   HGBUR small 04/23/2009 0817   BILIRUBINUR neg 06/22/2023 1506   KETONESUR NEGATIVE 04/30/2021 1801   PROTEINUR Negative 06/22/2023 1506   PROTEINUR 30 (A) 04/30/2021 1801   UROBILINOGEN 0.2 06/22/2023 1506   UROBILINOGEN 0.2 04/23/2009 0817   NITRITE neg 06/22/2023 1506   NITRITE POSITIVE (A) 04/30/2021 1801   LEUKOCYTESUR Small (1+) (A) 06/22/2023 1506   LEUKOCYTESUR NEGATIVE 04/30/2021 1801      Unresulted Labs (From admission, onward)     Start     Ordered   10/10/23 0500  Comprehensive metabolic panel  Tomorrow morning,   R        10/09/23 2227   10/10/23 0500  CBC  Tomorrow morning,   R        10/09/23 2227   10/09/23 2226  TSH  Add-on,   R        10/09/23 2227   10/09/23 2226  T4, free  Add-on,   R        10/09/23 2227            Medications  brimonidine (ALPHAGAN) 0.2 % ophthalmic solution 1 drop (has no administration in time range)  dorzolamide-timolol (COSOPT) 2-0.5 % ophthalmic solution 1 drop (has no administration in time range)  apixaban (ELIQUIS) tablet 5 mg (has no administration in time range)  loratadine (CLARITIN) tablet 10 mg (has no administration in time range)  ferrous sulfate tablet 325 mg (has no administration in time range)  gabapentin (NEURONTIN) capsule 100 mg (has no administration in time range)  sertraline (ZOLOFT) tablet 100 mg (has no administration in time range)  metoprolol tartrate (LOPRESSOR) tablet 25 mg (has no  administration in time range)  sodium chloride flush (NS) 0.9 % injection 3 mL (has no administration in time range)  lactated ringers infusion (has no administration in time range)  polyethylene glycol (MIRALAX / GLYCOLAX) packet 17 g (has no administration in time range)  bisacodyl (DULCOLAX) EC tablet 5 mg (has no administration in time range)  ondansetron (ZOFRAN) tablet 4 mg (has no administration in time range)    Or  ondansetron (ZOFRAN) injection 4 mg (has no administration in time range)  ondansetron (ZOFRAN) injection 4 mg (4 mg Intravenous Given 10/09/23 1639)  lidocaine (PF) (XYLOCAINE) 1 % injection 5 mL (5 mLs Intradermal Given 10/09/23 1819)  ondansetron (ZOFRAN) injection 4 mg (4 mg Intravenous Given 10/09/23 2046)  lactated ringers bolus 1,000 mL (1,000 mLs Intravenous New Bag/Given 10/09/23  2042)  acetaminophen (TYLENOL) tablet 650 mg (650 mg Oral Given 10/09/23 2143)    Radiological Exams on Admission: CT Head Wo Contrast  Result Date: 10/09/2023 CLINICAL DATA:  Head trauma, minor (Age >= 65y); Neck trauma (Age >= 65y); Facial trauma, blunt EXAM: CT HEAD WITHOUT CONTRAST CT MAXILLOFACIAL WITHOUT CONTRAST CT CERVICAL SPINE WITHOUT CONTRAST TECHNIQUE: Multidetector CT imaging of the head, cervical spine, and maxillofacial structures were performed using the standard protocol without intravenous contrast. Multiplanar CT image reconstructions of the cervical spine and maxillofacial structures were also generated. RADIATION DOSE REDUCTION: This exam was performed according to the departmental dose-optimization program which includes automated exposure control, adjustment of the mA and/or kV according to patient size and/or use of iterative reconstruction technique. COMPARISON:  CT head 07/13/2019, x-ray cervical spine 02/01/2011 FINDINGS: CT HEAD FINDINGS Brain: No evidence of large-territorial acute infarction. No parenchymal hemorrhage. No mass lesion. No extra-axial collection. No  mass effect or midline shift. No hydrocephalus. Basilar cisterns are patent. Vascular: No hyperdense vessel. Atherosclerotic calcifications are present within the cavernous internal carotid and vertebral arteries. Skull: No acute fracture or focal lesion. Other: None. CT MAXILLOFACIAL FINDINGS Osseous: No fracture or mandibular dislocation. No destructive process. Patient is edentulous. Sinuses/Orbits: Left sphenoid sinus mucosal thickening. Bilateral maxillary sinus mucosal thickening with on almost complete opacification. Maxillary sinus wall hypertrophy. Otherwise paranasal sinuses and mastoid air cells are clear. Bilateral lens replacement. Right scleral buckle. Right orbit calcification-likely phleboliths. Otherwise the orbits are unremarkable. Soft tissues: Negative. CT CERVICAL SPINE FINDINGS Alignment: Normal. Skull base and vertebrae: Multilevel mild are moderate degenerative changes of the spine. C6-C7 posterior disc osteophyte complex formation. No associated severe osseous neural foraminal or central canal stenosis. No acute fracture. No aggressive appearing focal osseous lesion or focal pathologic process. Soft tissues and spinal canal: No prevertebral fluid or swelling. No visible canal hematoma. Upper chest: Unremarkable. Other: Atherosclerotic plaque of the carotid arteries within the neck. IMPRESSION: 1. No acute intracranial abnormality. 2.  No acute displaced facial fracture. 3. No acute displaced fracture or traumatic listhesis of the cervical spine. 4. Chronic sinus disease. Electronically Signed   By: Tish Frederickson M.D.   On: 10/09/2023 17:33   CT Cervical Spine Wo Contrast  Result Date: 10/09/2023 CLINICAL DATA:  Head trauma, minor (Age >= 65y); Neck trauma (Age >= 65y); Facial trauma, blunt EXAM: CT HEAD WITHOUT CONTRAST CT MAXILLOFACIAL WITHOUT CONTRAST CT CERVICAL SPINE WITHOUT CONTRAST TECHNIQUE: Multidetector CT imaging of the head, cervical spine, and maxillofacial structures were  performed using the standard protocol without intravenous contrast. Multiplanar CT image reconstructions of the cervical spine and maxillofacial structures were also generated. RADIATION DOSE REDUCTION: This exam was performed according to the departmental dose-optimization program which includes automated exposure control, adjustment of the mA and/or kV according to patient size and/or use of iterative reconstruction technique. COMPARISON:  CT head 07/13/2019, x-ray cervical spine 02/01/2011 FINDINGS: CT HEAD FINDINGS Brain: No evidence of large-territorial acute infarction. No parenchymal hemorrhage. No mass lesion. No extra-axial collection. No mass effect or midline shift. No hydrocephalus. Basilar cisterns are patent. Vascular: No hyperdense vessel. Atherosclerotic calcifications are present within the cavernous internal carotid and vertebral arteries. Skull: No acute fracture or focal lesion. Other: None. CT MAXILLOFACIAL FINDINGS Osseous: No fracture or mandibular dislocation. No destructive process. Patient is edentulous. Sinuses/Orbits: Left sphenoid sinus mucosal thickening. Bilateral maxillary sinus mucosal thickening with on almost complete opacification. Maxillary sinus wall hypertrophy. Otherwise paranasal sinuses and mastoid air cells are clear. Bilateral lens replacement.  Right scleral buckle. Right orbit calcification-likely phleboliths. Otherwise the orbits are unremarkable. Soft tissues: Negative. CT CERVICAL SPINE FINDINGS Alignment: Normal. Skull base and vertebrae: Multilevel mild are moderate degenerative changes of the spine. C6-C7 posterior disc osteophyte complex formation. No associated severe osseous neural foraminal or central canal stenosis. No acute fracture. No aggressive appearing focal osseous lesion or focal pathologic process. Soft tissues and spinal canal: No prevertebral fluid or swelling. No visible canal hematoma. Upper chest: Unremarkable. Other: Atherosclerotic plaque of the  carotid arteries within the neck. IMPRESSION: 1. No acute intracranial abnormality. 2.  No acute displaced facial fracture. 3. No acute displaced fracture or traumatic listhesis of the cervical spine. 4. Chronic sinus disease. Electronically Signed   By: Tish Frederickson M.D.   On: 10/09/2023 17:33   CT Maxillofacial WO CM  Result Date: 10/09/2023 CLINICAL DATA:  Head trauma, minor (Age >= 65y); Neck trauma (Age >= 65y); Facial trauma, blunt EXAM: CT HEAD WITHOUT CONTRAST CT MAXILLOFACIAL WITHOUT CONTRAST CT CERVICAL SPINE WITHOUT CONTRAST TECHNIQUE: Multidetector CT imaging of the head, cervical spine, and maxillofacial structures were performed using the standard protocol without intravenous contrast. Multiplanar CT image reconstructions of the cervical spine and maxillofacial structures were also generated. RADIATION DOSE REDUCTION: This exam was performed according to the departmental dose-optimization program which includes automated exposure control, adjustment of the mA and/or kV according to patient size and/or use of iterative reconstruction technique. COMPARISON:  CT head 07/13/2019, x-ray cervical spine 02/01/2011 FINDINGS: CT HEAD FINDINGS Brain: No evidence of large-territorial acute infarction. No parenchymal hemorrhage. No mass lesion. No extra-axial collection. No mass effect or midline shift. No hydrocephalus. Basilar cisterns are patent. Vascular: No hyperdense vessel. Atherosclerotic calcifications are present within the cavernous internal carotid and vertebral arteries. Skull: No acute fracture or focal lesion. Other: None. CT MAXILLOFACIAL FINDINGS Osseous: No fracture or mandibular dislocation. No destructive process. Patient is edentulous. Sinuses/Orbits: Left sphenoid sinus mucosal thickening. Bilateral maxillary sinus mucosal thickening with on almost complete opacification. Maxillary sinus wall hypertrophy. Otherwise paranasal sinuses and mastoid air cells are clear. Bilateral lens  replacement. Right scleral buckle. Right orbit calcification-likely phleboliths. Otherwise the orbits are unremarkable. Soft tissues: Negative. CT CERVICAL SPINE FINDINGS Alignment: Normal. Skull base and vertebrae: Multilevel mild are moderate degenerative changes of the spine. C6-C7 posterior disc osteophyte complex formation. No associated severe osseous neural foraminal or central canal stenosis. No acute fracture. No aggressive appearing focal osseous lesion or focal pathologic process. Soft tissues and spinal canal: No prevertebral fluid or swelling. No visible canal hematoma. Upper chest: Unremarkable. Other: Atherosclerotic plaque of the carotid arteries within the neck. IMPRESSION: 1. No acute intracranial abnormality. 2.  No acute displaced facial fracture. 3. No acute displaced fracture or traumatic listhesis of the cervical spine. 4. Chronic sinus disease. Electronically Signed   By: Tish Frederickson M.D.   On: 10/09/2023 17:33     Data Reviewed: Relevant notes from primary care and specialist visits, past discharge summaries as available in EHR, including Care Everywhere. Prior diagnostic testing as pertinent to current admission diagnoses Updated medications and problem lists for reconciliation ED course, including vitals, labs, imaging, treatment and response to treatment Triage notes, nursing and pharmacy notes and ED provider's notes Notable results as noted in HPI  Assessment and Plan: * Syncope and collapse Patient presenting with loss of consciousness episodes and syncope presentation and collapse.  Initial stat evaluation on arrival with a head CT noncontrast is negative for any acute intracranial abnormalities.  Vitals show low  blood pressure suspect orthostasis may have a component in this. We will admit to progressive cardiac unit monitor patient's heart rhythm and rate and further evaluate for other additional possibilities which in this case may be TIA, medication related,  hypoglycemia, PE less likely as patient is on anticoagulation, GI bleed. Additional supportive care measures fall precautions n.p.o. except meds, orthostatic vitals.  Suspect this is more likely a nonneurological syncope.  Persistent atrial fibrillation (HCC) Persistent atrial fibrillation rate controlled, continue Eliquis.  Monitor on telemetry.  Blood pressure is low we will therefore hold patient's Imdur and continue metoprolol 25 twice daily reducing her dose from 100 daily.Marland Kitchen   DVT (deep venous thrombosis) (HCC) Again with continued patient's Eliquis.  With close monitoring for possible blood loss or GI bleed.  Dark stools Will obtain a stool guaiac.  IV PPI.  Follow CBC.  GI consult as deemed necessary.  Type and screen.  N.p.o. after midnight except meds. Most recent GI note is in April 2024, per report patient has declined endoscopy and colonoscopy unless absolutely necessary was started on iron supplementation therapy with continued ongoing anticoagulation. Hold will discuss with patient about the need for evaluation of ongoing chronic blood loss that may be worsening causing syncope in addition to chronic iron deficiency which may be putting strain in her heart as well.  Putting her at high risk for decompensation and heart failure.  B12 deficiency MCV 102.5, will check a B12 level.  Type 2 diabetes mellitus with chronic kidney disease (HCC) Swallow evaluation, glycemic protocol.  Accu-Cheks depending on patient's swallow results if patient fails swallow eval then we will do Accu-Cheks every 4 if patient passes swallow eval we will do Premeal 3 times daily.  Coronary artery disease involving native coronary artery of native heart without angina pectoris No reports of chest discomfort or anginal equivalent symptoms, troponin within normal limit x 2 along with EKG being nonischemic.  Will admit and follow in telemetry progressive unit.  2D echocardiogram as deemed  appropriate.  Essential hypertension Vitals:   10/09/23 1622 10/09/23 1900 10/09/23 2000  BP: 129/85 (!) 104/58 122/66  Hold Imdur 90 mg, metoprolol started at 25 p.o. twice daily.   [employer] Chart review shows ophthalmology note from Dr. 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        DVT prophylaxis:  Eliquis  Consults:  None  Advance Care Planning:    Code Status: Prior   Family Communication:  None  Disposition Plan:  To be determined  Severity of Illness: The appropriate patient status for this patient is INPATIENT. Inpatient status is judged to be reasonable and necessary in order to provide the required intensity of service to ensure the patient's safety. The patient's presenting symptoms, physical exam findings, and initial radiographic and laboratory data in the context of their chronic comorbidities is felt to place them at high risk for further clinical deterioration. Furthermore, it is not anticipated that the patient will be medically stable for discharge from the hospital within 2 midnights of admission.   * I certify that at the point of admission it is my clinical judgment that the patient will require inpatient hospital care spanning beyond 2 midnights from the point of admission due to high intensity of service, high risk for further deterioration and high frequency of surveillance required.*  Author: Gertha Calkin, MD 10/09/2023 10:27 PM  For on call  review [email or web address].  Orders Placed This Encounter  Procedures   CT Head Wo Contrast    Standing Status:   Standing    Number of Occurrences:   1   CT Cervical Spine Wo Contrast    Standing Status:   Standing    Number of Occurrences:   1   CT Maxillofacial WO CM    Standing Status:   Standing    Number of Occurrences:   1   Comprehensive metabolic panel    Standing Status:    Standing    Number of Occurrences:   1   CBC with Differential    Standing Status:   Standing    Number of Occurrences:   1   Brain natriuretic peptide    Standing Status:   Standing    Number of Occurrences:   1   TSH    Standing Status:   Standing    Number of Occurrences:   1   T4, free    Standing Status:   Standing    Number of Occurrences:   1   Comprehensive metabolic panel    Standing Status:   Standing    Number of Occurrences:   1   CBC    Standing Status:   Standing    Number of Occurrences:   1   Diet heart healthy/carb modified Room service appropriate? Yes; Fluid consistency: Thin    Standing Status:   Standing    Number of Occurrences:   1    Order Specific Question:   Diet-HS Snack?    Answer:   Nothing    Order Specific Question:   Room service appropriate?    Answer:   Yes    Order Specific Question:   Fluid consistency:    Answer:   Thin   Cardiac monitoring    Standing Status:   Standing    Number of Occurrences:   1   Maintain IV access    Standing Status:   Standing    Number of Occurrences:   1   Vital signs    Standing Status:   Standing    Number of Occurrences:   1   Orthostatic vital signs on admission    Standing Status:   Standing    Number of Occurrences:   1   Notify physician (specify)    Standing Status:   Standing    Number of Occurrences:   20    Order Specific Question:   Notify Physician    Answer:   for pulse less than 55 or greater than 120    Order Specific Question:   Notify Physician    Answer:   for respiratory rate less than 12 or greater than 25    Order Specific Question:   Notify Physician    Answer:   for temperature greater than 100.5 F    Order Specific Question:   Notify Physician    Answer:   for urinary output less than 30 mL/hr for four hours    Order Specific Question:   Notify Physician    Answer:   for systolic BP less than 90 or greater than 160, diastolic BP less than 60 or greater than 100    Order  Specific Question:   Notify Physician    Answer:   for new hypoxia w/ oxygen saturations < 88%   Up with assistance    Standing Status:   Standing    Number of Occurrences:  20   Daily weights    Standing Status:   Standing    Number of Occurrences:   1   If patient diabetic or glucose greater than 140 notify physician for Sliding Scale Insulin Orders    Standing Status:   Standing    Number of Occurrences:   20   Intake and output    Standing Status:   Standing    Number of Occurrences:   1   Neuro checks    Standing Status:   Standing    Number of Occurrences:   1   Apply Syncope Care Plan    Standing Status:   Standing    Number of Occurrences:   1   Initiate Oral Care Protocol    Standing Status:   Standing    Number of Occurrences:   1   Initiate Carrier Fluid Protocol    Standing Status:   Standing    Number of Occurrences:   1   Full code    Standing Status:   Standing    Number of Occurrences:   1    Order Specific Question:   By:    Answer:   Other   Consult to hospitalist    Standing Status:   Standing    Number of Occurrences:   1    Order Specific Question:   Place call to:    Answer:   [id number]    Order Specific Question:   Reason for Consult    Answer:   Admit   Pulse oximetry with vital signs    Standing Status:   Standing    Number of Occurrences:   1   EKG 12-Lead    Standing Status:   Standing    Number of Occurrences:   1   EKG 12-Lead    Standing Status:   Standing    Number of Occurrences:   1   ECHOCARDIOGRAM COMPLETE    Contraindicated if done in last 30 days    Standing Status:   Standing    Number of Occurrences:   1    Order Specific Question:   Perflutren DEFINITY (image enhancing agent) should be administered unless hypersensitivity or allergy exist    Answer:   Administer Perflutren    Order Specific Question:   Will [facility] be the location of this test?    Answer:   Yes    Order Specific Question:   Please indicate who you  request to read the nuc med / echo results.    Answer:   [facility]    Order Specific Question:   Reason for exam-Echo    Answer:   Syncope R55   Type and screen [facility]    [facility]     Standing Status:   Standing    Number of Occurrences:   1   Admit to Inpatient (patient's expected length of stay will be greater than 2 midnights or inpatient only procedure)    Standing Status:   Standing    Number of Occurrences:   1    Order Specific Question:   Hospital Area    Answer:   [facility] [[id number]]    Order Specific Question:   Level of Care    Answer:   Progressive [102]    Order Specific Question:   Admit to Progressive based on following criteria    Answer:   CARDIOVASCULAR & THORACIC of moderate  stability with acute coronary syndrome symptoms/low risk myocardial infarction/hypertensive urgency/arrhythmias/heart failure potentially compromising stability and stable post cardiovascular intervention patients.    Order Specific Question:   Admit to Progressive based on following criteria    Answer:   NEUROLOGICAL AND NEUROSURGICAL complex patients with significant risk of instability, who do not meet ICU criteria, yet require close observation or frequent assessment (< / = every 2 - 4 hours) with medical / nursing intervention.    Order Specific Question:   Covid Evaluation    Answer:   Confirmed COVID Negative    Order Specific Question:   Diagnosis    Answer:   Syncope and collapse [780.2.ICD-9-CM]    Order Specific Question:   Admitting Physician    Answer:   Darrold Junker    Order Specific Question:   Attending Physician    Answer:   Darrold Junker    Order Specific Question:   Certification:    Answer:   I certify this patient will need inpatient services for at least 2 midnights    Order Specific Question:   Expected Medical Readiness    Answer:   10/11/2023

## 2023-10-09 NOTE — Assessment & Plan Note (Signed)
Chart review shows ophthalmology note from Dr. 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

## 2023-10-09 NOTE — ED Provider Notes (Signed)
[facility] Provider Note   Event Date/Time   First MD Initiated Contact with Patient 10/09/23 1633     (approximate) History  Fall  HPI [name] [name] is a 89 y.o. female with a past medical history of CHF, A-fib on Eliquis, type 2 diabetes, hypertension, hyperlipidemia, and CKD who presents after a syncopal episode suffering a laceration to the left lateral eyebrow after losing consciousness soon after standing up from the toilet.  Patient states that she felt extremely lightheaded prior to passing out and woke up on the floor surrounded by blood from the left eyebrow wound.  EMS note that patient has been soaking through multiple trauma pads secondary to this laceration.  Patient endorses nausea and headache without any other complaints. ROS: Patient currently denies any vision changes, tinnitus, difficulty speaking, facial droop, sore throat, chest pain, shortness of breath, abdominal pain, vomiting/diarrhea, dysuria, or weakness/numbness/paresthesias in any extremity   Physical Exam  Triage Vital Signs: ED Triage Vitals  Encounter Vitals Group     BP 10/09/23 1622 129/85     Systolic BP Percentile --      Diastolic BP Percentile --      Pulse Rate 10/09/23 1622 (!) 102     Resp 10/09/23 1622 17     Temp 10/09/23 1622 97.7 F (36.5 C)     Temp Source 10/09/23 1622 Oral     SpO2 10/09/23 1622 98 %     Weight 10/09/23 1623 166 lb (75.3 kg)     Height 10/09/23 1623 5\' 9"  (1.753 m)     Head Circumference --      Peak Flow --      Pain Score 10/09/23 1623 5     Pain Loc --      Pain Education --      Exclude from Growth Chart --    Most recent vital signs: Vitals:   10/09/23 2300 10/09/23 2311  BP: (!) 89/62 106/60  Pulse: (!) 104 92  Resp: 20 19  Temp:    SpO2: 100% 98%   General: Awake, oriented x4. CV:  Good peripheral perfusion. Resp:  Normal effort. Abd:  No distention. Other:  Elderly overweight Caucasian female resting comfortably in no  acute distress.  Wheezing laceration to the left lateral eyebrow ED Results / Procedures / Treatments  Labs (all labs ordered are listed, but only abnormal results are displayed) Labs Reviewed  COMPREHENSIVE METABOLIC PANEL - Abnormal; Notable for the following components:      Result Value   Glucose, Bld 197 (*)    BUN 52 (*)    Creatinine, Ser 1.33 (*)    Calcium 8.5 (*)    Total Protein 6.4 (*)    Alkaline Phosphatase 24 (*)    GFR, Estimated 38 (*)    All other components within normal limits  CBC WITH DIFFERENTIAL/PLATELET - Abnormal; Notable for the following components:   RBC 2.75 (*)    Hemoglobin 9.2 (*)    HCT 28.2 (*)    MCV 102.5 (*)    nRBC 0.5 (*)    All other components within normal limits  BRAIN NATRIURETIC PEPTIDE - Abnormal; Notable for the following components:   B Natriuretic Peptide 363.8 (*)    All other components within normal limits  TSH  T4, FREE  COMPREHENSIVE METABOLIC PANEL  CBC  TYPE AND SCREEN  TROPONIN I (HIGH SENSITIVITY)  TROPONIN I (HIGH SENSITIVITY)   EKG ED ECG REPORT I, Clent Jacks  Phoebie Shad, the attending physician, personally viewed and interpreted this ECG. Date: 10/09/2023 EKG Time: 1616 Rate: 85 Rhythm: Atrial fibrillation QRS Axis: normal Intervals: normal ST/T Wave abnormalities: normal Narrative Interpretation: Atrial fibrillation.  No evidence of acute ischemia RADIOLOGY ED MD interpretation: CT of the head without contrast interpreted by me shows no evidence of acute abnormalities including no intracerebral hemorrhage, obvious masses, or significant edema  CT of the cervical spine interpreted by me does not show any evidence of acute abnormalities including no acute fracture, malalignment, height loss, or dislocation  CT of the maxillofacial structures interpreted independently by me and shows no evidence of acute fracture dislocation or malocclusion -Agree with radiology assessment Official radiology report(s): CT Head Wo  Contrast  Result Date: 10/09/2023 CLINICAL DATA:  Head trauma, minor (Age >= 65y); Neck trauma (Age >= 65y); Facial trauma, blunt EXAM: CT HEAD WITHOUT CONTRAST CT MAXILLOFACIAL WITHOUT CONTRAST CT CERVICAL SPINE WITHOUT CONTRAST TECHNIQUE: Multidetector CT imaging of the head, cervical spine, and maxillofacial structures were performed using the standard protocol without intravenous contrast. Multiplanar CT image reconstructions of the cervical spine and maxillofacial structures were also generated. RADIATION DOSE REDUCTION: This exam was performed according to the departmental dose-optimization program which includes automated exposure control, adjustment of the mA and/or kV according to patient size and/or use of iterative reconstruction technique. COMPARISON:  CT head 07/13/2019, x-ray cervical spine 02/01/2011 FINDINGS: CT HEAD FINDINGS Brain: No evidence of large-territorial acute infarction. No parenchymal hemorrhage. No mass lesion. No extra-axial collection. No mass effect or midline shift. No hydrocephalus. Basilar cisterns are patent. Vascular: No hyperdense vessel. Atherosclerotic calcifications are present within the cavernous internal carotid and vertebral arteries. Skull: No acute fracture or focal lesion. Other: None. CT MAXILLOFACIAL FINDINGS Osseous: No fracture or mandibular dislocation. No destructive process. Patient is edentulous. Sinuses/Orbits: Left sphenoid sinus mucosal thickening. Bilateral maxillary sinus mucosal thickening with on almost complete opacification. Maxillary sinus wall hypertrophy. Otherwise paranasal sinuses and mastoid air cells are clear. Bilateral lens replacement. Right scleral buckle. Right orbit calcification-likely phleboliths. Otherwise the orbits are unremarkable. Soft tissues: Negative. CT CERVICAL SPINE FINDINGS Alignment: Normal. Skull base and vertebrae: Multilevel mild are moderate degenerative changes of the spine. C6-C7 posterior disc osteophyte complex  formation. No associated severe osseous neural foraminal or central canal stenosis. No acute fracture. No aggressive appearing focal osseous lesion or focal pathologic process. Soft tissues and spinal canal: No prevertebral fluid or swelling. No visible canal hematoma. Upper chest: Unremarkable. Other: Atherosclerotic plaque of the carotid arteries within the neck. IMPRESSION: 1. No acute intracranial abnormality. 2.  No acute displaced facial fracture. 3. No acute displaced fracture or traumatic listhesis of the cervical spine. 4. Chronic sinus disease. Electronically Signed   By: Tish Frederickson M.D.   On: 10/09/2023 17:33   CT Cervical Spine Wo Contrast  Result Date: 10/09/2023 CLINICAL DATA:  Head trauma, minor (Age >= 65y); Neck trauma (Age >= 65y); Facial trauma, blunt EXAM: CT HEAD WITHOUT CONTRAST CT MAXILLOFACIAL WITHOUT CONTRAST CT CERVICAL SPINE WITHOUT CONTRAST TECHNIQUE: Multidetector CT imaging of the head, cervical spine, and maxillofacial structures were performed using the standard protocol without intravenous contrast. Multiplanar CT image reconstructions of the cervical spine and maxillofacial structures were also generated. RADIATION DOSE REDUCTION: This exam was performed according to the departmental dose-optimization program which includes automated exposure control, adjustment of the mA and/or kV according to patient size and/or use of iterative reconstruction technique. COMPARISON:  CT head 07/13/2019, x-ray cervical spine 02/01/2011 FINDINGS: CT HEAD FINDINGS  Brain: No evidence of large-territorial acute infarction. No parenchymal hemorrhage. No mass lesion. No extra-axial collection. No mass effect or midline shift. No hydrocephalus. Basilar cisterns are patent. Vascular: No hyperdense vessel. Atherosclerotic calcifications are present within the cavernous internal carotid and vertebral arteries. Skull: No acute fracture or focal lesion. Other: None. CT MAXILLOFACIAL FINDINGS Osseous:  No fracture or mandibular dislocation. No destructive process. Patient is edentulous. Sinuses/Orbits: Left sphenoid sinus mucosal thickening. Bilateral maxillary sinus mucosal thickening with on almost complete opacification. Maxillary sinus wall hypertrophy. Otherwise paranasal sinuses and mastoid air cells are clear. Bilateral lens replacement. Right scleral buckle. Right orbit calcification-likely phleboliths. Otherwise the orbits are unremarkable. Soft tissues: Negative. CT CERVICAL SPINE FINDINGS Alignment: Normal. Skull base and vertebrae: Multilevel mild are moderate degenerative changes of the spine. C6-C7 posterior disc osteophyte complex formation. No associated severe osseous neural foraminal or central canal stenosis. No acute fracture. No aggressive appearing focal osseous lesion or focal pathologic process. Soft tissues and spinal canal: No prevertebral fluid or swelling. No visible canal hematoma. Upper chest: Unremarkable. Other: Atherosclerotic plaque of the carotid arteries within the neck. IMPRESSION: 1. No acute intracranial abnormality. 2.  No acute displaced facial fracture. 3. No acute displaced fracture or traumatic listhesis of the cervical spine. 4. Chronic sinus disease. Electronically Signed   By: Tish Frederickson M.D.   On: 10/09/2023 17:33   CT Maxillofacial WO CM  Result Date: 10/09/2023 CLINICAL DATA:  Head trauma, minor (Age >= 65y); Neck trauma (Age >= 65y); Facial trauma, blunt EXAM: CT HEAD WITHOUT CONTRAST CT MAXILLOFACIAL WITHOUT CONTRAST CT CERVICAL SPINE WITHOUT CONTRAST TECHNIQUE: Multidetector CT imaging of the head, cervical spine, and maxillofacial structures were performed using the standard protocol without intravenous contrast. Multiplanar CT image reconstructions of the cervical spine and maxillofacial structures were also generated. RADIATION DOSE REDUCTION: This exam was performed according to the departmental dose-optimization program which includes automated  exposure control, adjustment of the mA and/or kV according to patient size and/or use of iterative reconstruction technique. COMPARISON:  CT head 07/13/2019, x-ray cervical spine 02/01/2011 FINDINGS: CT HEAD FINDINGS Brain: No evidence of large-territorial acute infarction. No parenchymal hemorrhage. No mass lesion. No extra-axial collection. No mass effect or midline shift. No hydrocephalus. Basilar cisterns are patent. Vascular: No hyperdense vessel. Atherosclerotic calcifications are present within the cavernous internal carotid and vertebral arteries. Skull: No acute fracture or focal lesion. Other: None. CT MAXILLOFACIAL FINDINGS Osseous: No fracture or mandibular dislocation. No destructive process. Patient is edentulous. Sinuses/Orbits: Left sphenoid sinus mucosal thickening. Bilateral maxillary sinus mucosal thickening with on almost complete opacification. Maxillary sinus wall hypertrophy. Otherwise paranasal sinuses and mastoid air cells are clear. Bilateral lens replacement. Right scleral buckle. Right orbit calcification-likely phleboliths. Otherwise the orbits are unremarkable. Soft tissues: Negative. CT CERVICAL SPINE FINDINGS Alignment: Normal. Skull base and vertebrae: Multilevel mild are moderate degenerative changes of the spine. C6-C7 posterior disc osteophyte complex formation. No associated severe osseous neural foraminal or central canal stenosis. No acute fracture. No aggressive appearing focal osseous lesion or focal pathologic process. Soft tissues and spinal canal: No prevertebral fluid or swelling. No visible canal hematoma. Upper chest: Unremarkable. Other: Atherosclerotic plaque of the carotid arteries within the neck. IMPRESSION: 1. No acute intracranial abnormality. 2.  No acute displaced facial fracture. 3. No acute displaced fracture or traumatic listhesis of the cervical spine. 4. Chronic sinus disease. Electronically Signed   By: Tish Frederickson M.D.   On: 10/09/2023 17:33    PROCEDURES: Critical Care performed: No .1-3  Lead EKG Interpretation  Performed by: Merwyn Katos, MD Authorized by: Merwyn Katos, MD     Interpretation: normal     ECG rate:  91   ECG rate assessment: normal     Rhythm: sinus rhythm     Ectopy: none     Conduction: normal    MEDICATIONS ORDERED IN ED: Medications  brimonidine (ALPHAGAN) 0.2 % ophthalmic solution 1 drop (has no administration in time range)  dorzolamide-timolol (COSOPT) 2-0.5 % ophthalmic solution 1 drop (has no administration in time range)  apixaban (ELIQUIS) tablet 5 mg (has no administration in time range)  loratadine (CLARITIN) tablet 10 mg (has no administration in time range)  ferrous sulfate tablet 325 mg (has no administration in time range)  gabapentin (NEURONTIN) capsule 100 mg (has no administration in time range)  sertraline (ZOLOFT) tablet 100 mg (has no administration in time range)  sodium chloride flush (NS) 0.9 % injection 3 mL (has no administration in time range)  lactated ringers infusion (has no administration in time range)  polyethylene glycol (MIRALAX / GLYCOLAX) packet 17 g (has no administration in time range)  bisacodyl (DULCOLAX) EC tablet 5 mg (has no administration in time range)  ondansetron (ZOFRAN) tablet 4 mg (has no administration in time range)    Or  ondansetron (ZOFRAN) injection 4 mg (has no administration in time range)  pantoprazole (PROTONIX) injection 40 mg (has no administration in time range)  ondansetron (ZOFRAN) injection 4 mg (4 mg Intravenous Given 10/09/23 1639)  lidocaine (PF) (XYLOCAINE) 1 % injection 5 mL (5 mLs Intradermal Given 10/09/23 1819)  ondansetron (ZOFRAN) injection 4 mg (4 mg Intravenous Given 10/09/23 2046)  lactated ringers bolus 1,000 mL (1,000 mLs Intravenous New Bag/Given 10/09/23 2042)  acetaminophen (TYLENOL) tablet 650 mg (650 mg Oral Given 10/09/23 2143)  sodium chloride 0.9 % bolus 250 mL (250 mLs Intravenous New Bag/Given 10/09/23  2308)   IMPRESSION / MDM / ASSESSMENT AND PLAN / ED COURSE  I reviewed the triage vital signs and the nursing notes.                             The patient is on the cardiac monitor to evaluate for evidence of arrhythmia and/or significant heart rate changes. Patient's presentation is most consistent with acute presentation with potential threat to life or bodily function. Patient presents with complaints of syncope/presyncope ED Workup:  CBC, BMP, Troponin, BNP, ECG, CXR Differential diagnosis includes HF, ICH, seizure, stroke, HOCM, ACS, aortic dissection, malignant arrhythmia, or GI bleed. Findings: No evidence of acute laboratory abnormalities.  Troponin negative x1 EKG: No e/o STEMI. No evidence of Brugadas sign, delta wave, epsilon wave, significantly prolonged QTc, or malignant arrhythmia.  Disposition: Admit to medicine, telemetry bed for cardiac monitoring and cardiology review.   FINAL CLINICAL IMPRESSION(S) / ED DIAGNOSES   Final diagnoses:  Loss of consciousness (HCC)  Contusion of face, initial encounter  Laceration of left eyebrow, initial encounter  Concussion with loss of consciousness of 30 minutes or less, initial encounter   Rx / DC Orders   ED Discharge Orders     None      Note:  This document was prepared using Dragon voice recognition software and may include unintentional dictation errors.   Merwyn Katos, MD 10/09/23 [phone]

## 2023-10-09 NOTE — Assessment & Plan Note (Signed)
Again with continued patient's Eliquis.  With close monitoring for possible blood loss or GI bleed.

## 2023-10-09 NOTE — Assessment & Plan Note (Signed)
Persistent atrial fibrillation rate controlled, continue Eliquis.  Monitor on telemetry.  Blood pressure is low we will therefore hold patient's Imdur and continue metoprolol 25 twice daily reducing her dose from 100 daily.Marland Kitchen

## 2023-10-09 NOTE — ED Notes (Signed)
Provider at bedside to suture

## 2023-10-10 ENCOUNTER — Ambulatory Visit: Payer: Medicare PPO | Admitting: Primary Care

## 2023-10-10 DIAGNOSIS — R55 Syncope and collapse: Secondary | ICD-10-CM | POA: Diagnosis not present

## 2023-10-10 DIAGNOSIS — Z8669 Personal history of other diseases of the nervous system and sense organs: Secondary | ICD-10-CM | POA: Diagnosis not present

## 2023-10-10 DIAGNOSIS — D62 Acute posthemorrhagic anemia: Secondary | ICD-10-CM

## 2023-10-10 DIAGNOSIS — D509 Iron deficiency anemia, unspecified: Secondary | ICD-10-CM

## 2023-10-10 HISTORY — DX: Acute posthemorrhagic anemia: D62

## 2023-10-10 LAB — CBC
HCT: 20.3 % — ABNORMAL LOW (ref 36.0–46.0)
Hemoglobin: 6.7 g/dL — ABNORMAL LOW (ref 12.0–15.0)
MCH: 33.7 pg (ref 26.0–34.0)
MCHC: 33 g/dL (ref 30.0–36.0)
MCV: 102 fL — ABNORMAL HIGH (ref 80.0–100.0)
Platelets: 210 10*3/uL (ref 150–400)
RBC: 1.99 MIL/uL — ABNORMAL LOW (ref 3.87–5.11)
RDW: 14.4 % (ref 11.5–15.5)
WBC: 6 10*3/uL (ref 4.0–10.5)
nRBC: 0.5 % — ABNORMAL HIGH (ref 0.0–0.2)

## 2023-10-10 LAB — IRON AND TIBC
Iron: 248 ug/dL — ABNORMAL HIGH (ref 28–170)
Saturation Ratios: 82 % — ABNORMAL HIGH (ref 10.4–31.8)
TIBC: 304 ug/dL (ref 250–450)
UIBC: 56 ug/dL

## 2023-10-10 LAB — COMPREHENSIVE METABOLIC PANEL
ALT: 8 U/L (ref 0–44)
AST: 13 U/L — ABNORMAL LOW (ref 15–41)
Albumin: 2.9 g/dL — ABNORMAL LOW (ref 3.5–5.0)
Alkaline Phosphatase: 18 U/L — ABNORMAL LOW (ref 38–126)
Anion gap: 6 (ref 5–15)
BUN: 66 mg/dL — ABNORMAL HIGH (ref 8–23)
CO2: 25 mmol/L (ref 22–32)
Calcium: 8 mg/dL — ABNORMAL LOW (ref 8.9–10.3)
Chloride: 106 mmol/L (ref 98–111)
Creatinine, Ser: 1.33 mg/dL — ABNORMAL HIGH (ref 0.44–1.00)
GFR, Estimated: 38 mL/min — ABNORMAL LOW (ref >60)
Glucose, Bld: 174 mg/dL — ABNORMAL HIGH (ref 70–99)
Potassium: 4.1 mmol/L (ref 3.5–5.1)
Sodium: 137 mmol/L (ref 135–145)
Total Bilirubin: 0.4 mg/dL (ref <1.2)
Total Protein: 5.2 g/dL — ABNORMAL LOW (ref 6.5–8.1)

## 2023-10-10 LAB — PREPARE RBC (CROSSMATCH)

## 2023-10-10 LAB — FERRITIN: Ferritin: 28 ng/mL (ref 11–307)

## 2023-10-10 LAB — HEMOGLOBIN AND HEMATOCRIT, BLOOD
HCT: 23.4 % — ABNORMAL LOW (ref 36.0–46.0)
Hemoglobin: 7.6 g/dL — ABNORMAL LOW (ref 12.0–15.0)

## 2023-10-10 LAB — ABO/RH: ABO/RH(D): A POS

## 2023-10-10 MED ORDER — MUPIROCIN 2 % EX OINT
1.0000 | TOPICAL_OINTMENT | Freq: Two times a day (BID) | CUTANEOUS | Status: DC
  Administered 2023-10-11 – 2023-10-14 (×8): 1 via NASAL
  Filled 2023-10-10: qty 22

## 2023-10-10 MED ORDER — SODIUM CHLORIDE 0.9 % IV SOLN: Freq: Once | INTRAVENOUS | Status: AC

## 2023-10-10 NOTE — ED Notes (Signed)
Pt assisted with meal tray, condiments opened, bedside table readjusted, pt denies any other needs. Call bell in reach.

## 2023-10-10 NOTE — ED Notes (Signed)
Dried blood cleansed from pt's hair and left side of face with NS.

## 2023-10-10 NOTE — ED Notes (Signed)
Pt with incontinence of urine. Bed linen changed and pt placed in clean dry brief. Denies any complaints of dizziness and remains A&O x 4 in nad reclining on stretcher.

## 2023-10-10 NOTE — Consult Note (Signed)
[name] [name] , MD [address], [place], [place], [address] [address], [address], [place], [place], [address] Phone: [phone]  Fax: [phone]  Consultation  Referring Provider:    Dr [name] [name] Primary Care Physician:  [name] Nest, NP Primary Gastroenterologist:  Dr. Rhea [name]          Reason for Consultation:     Anemia   Date of Admission:  10/09/2023 Date of Consultation:  10/10/2023         HPI:   [name] [name] is a 89 y.o. female who has a history of CAD on Eliquis last dose taken yesterday, CKD, GERD, DVT, known to have a history of iron deficiency anemia seen by Dr. Rhea [name] in April 2024 at that point of time she was recommended EGD and colonoscopy but patient did not want to proceed due to her age and the risks associated with it.  She was started on iron tablets.  Last colonoscopy was back in 2012 she has a history of rectal cancer that she states which was treated with radiation many years back.  In her chart I only have history of intestinal non-Hodgkin's lymphoma treated with chemoradiation back in 2013.   This admission she says she had a fall passed out was found in a pool of blood due to an injury on her left brow.  She does mention that a few days prior to the fall she has had multiple episodes of nosebleeds and has bled at least half a pint on each occasion.  Denies any NSAID use.  Denies any hematemesis.  Says her stool is dark in color since she started her iron tablets.  No changes recently.  Denies any abdominal pain.  Past Medical History:  Diagnosis Date   Acute bilateral thoracic back pain 08/16/2019   Allergy    Cipro, Plavix, Statins   Anemia    Arthritis    Atrial fibrillation (HCC)    CAD (coronary artery disease)    a. s/p tandem Promus DES to LAD in 2009 by Dr. Juanda Chance;  b.  LHC (03/22/14):  prox LAD 30%, mid LAD 40%, LAD stent ok with dist 20% ISR, apical LAD occluded with L-L collats filling apical vessel (too small for PCI), mid RCA  20%, EF 70%.  Med Rx   Cancer [facility])    Chronic kidney disease, stage 3b (HCC)    Colon polyps    Diabetes mellitus    Diagnosed 2012   Diverticulosis of colon (without mention of hemorrhage)    DVT (deep venous thrombosis) (HCC) 12/2022   right leg   GERD (gastroesophageal reflux disease)    Glaucoma    Grade I diastolic dysfunction    Grade I diastolic dysfunction    Herpes zoster without complication 04/24/2020   Hyperlipidemia    intol of statins   Hypertension    Kidney stones    Left atrial dilation    Moderate aortic regurgitation    Motion sickness    back seat cars   NHL (non-Hodgkin's lymphoma) (HCC) dx'd 2003   chemo/xrt comp 2003   Personal history of colonic polyps    Proctitis    Pruritic intertrigo 07/18/2014   Urticaria    Wears dentures    Full upper, partial lower   Wears hearing aid in both ears     Past Surgical History:  Procedure Laterality Date   cardiac cath-neg     CATARACT EXTRACTION Bilateral    CORONARY ANGIOPLASTY WITH  STENT PLACEMENT     x 2   dexa-neg     KNEE SURGERY  10/30/2003   left   LEFT HEART CATHETERIZATION WITH CORONARY ANGIOGRAM N/A 03/22/2014   Procedure: LEFT HEART CATHETERIZATION WITH CORONARY ANGIOGRAM;  Surgeon: Kathleene Hazel, MD;  Location: [facility] CATH LAB;  Service: Cardiovascular;  Laterality: N/A;   stress cardiolite     VAGINAL HYSTERECTOMY     partial, fibroids, one ovary left    Prior to Admission medications   Medication Sig Start Date End Date Taking? Authorizing Provider  Bempedoic Acid-Ezetimibe (NEXLIZET) 180-10 MG TABS Take 1 tablet by mouth daily. 04/13/23  Yes Kathleene Hazel, MD  Blood Glucose Monitoring Suppl DEVI 1 each by Does not apply route in the morning, at noon, and at bedtime. May substitute to any manufacturer covered by patient's insurance. 02/16/23  Yes [name] Nest, NP  brimonidine (ALPHAGAN) 0.2 % ophthalmic solution Place 1 drop into both eyes in the morning and at  bedtime.   Yes [provider]  Cholecalciferol (VITAMIN D-3) 1000 UNITS CAPS Take 1 capsule by mouth daily.   Yes [provider]  CRANBERRY PO Take 1 tablet by mouth daily.   Yes [provider]  diclofenac Sodium (VOLTAREN) 1 % GEL APPLY 2 GRAMS TOPICALLY THREE TIMES A DAY AS NEEDED 02/05/23  Yes [name] Nest, NP  dorzolamide-timolol (COSOPT) 2-0.5 % ophthalmic solution Place 1 drop into both eyes 2 (two) times daily. 05/09/23  Yes [provider]  Dulaglutide (TRULICITY) 0.75 MG/0.5ML SOPN Inject 0.75 mg into the skin once a week. for diabetes. 08/25/23  Yes [name] Nest, NP  ELIQUIS 5 MG TABS tablet TAKE 1 TABLET TWICE DAILY 06/28/23  Yes Kathleene Hazel, MD  ferrous sulfate 325 (65 FE) MG tablet Take 1 tablet (325 mg total) by mouth daily with breakfast. 10/28/22  Yes Meredith Pel, NP  fexofenadine (ALLEGRA) 180 MG tablet Take 1 tablet (180 mg total) by mouth as needed for allergies or rhinitis. 04/03/19  Yes [name] Nest, NP  furosemide (LASIX) 20 MG tablet Take 2 tablets by mouth every morning for 3-5 days for fluid build up. 10/02/23  Yes [name] Nest, NP  gabapentin (NEURONTIN) 100 MG capsule Take 1 capsule (100 mg total) by mouth 3 (three) times daily. 05/11/23  Yes Felecia Shelling, DPM  glipiZIDE (GLUCOTROL XL) 10 MG 24 hr tablet Take 1 tablet (10 mg total) by mouth daily with breakfast. for diabetes. 04/13/23  Yes [name] Nest, NP  Glucose Blood (BLOOD GLUCOSE TEST STRIPS) STRP 1 each by In Vitro route in the morning, at noon, and at bedtime. May substitute to any manufacturer covered by patient's insurance. 02/16/23  Yes [name] Nest, NP  isosorbide mononitrate (IMDUR) 60 MG 24 hr tablet Take 1.5 tablets (90 mg total) by mouth daily. 08/24/23  Yes Kathleene Hazel, MD  Lancets Misc. MISC 1 each by Does not apply route in the morning, at noon, and at bedtime. May substitute to any manufacturer covered by  patient's insurance. 02/16/23  Yes [name] Nest, NP  metoprolol succinate (TOPROL-XL) 100 MG 24 hr tablet Take one (1) tablet by mouth (100 mg) daily. Take with or immediately following a meal. 04/19/23  Yes McAlhany, Nile Dear, MD  nitroGLYCERIN (NITROSTAT) 0.4 MG SL tablet Place 1 tablet (0.4 mg total) under the tongue every 5 (five) minutes as needed for chest pain. 04/21/23  Yes Kathleene Hazel, MD  pantoprazole (  PROTONIX) 20 MG tablet TAKE 1 TABLET BY MOUTH DAILY FOR HEARTBURN 09/14/23  Yes [name] Nest, NP  ROCKLATAN 0.02-0.005 % SOLN Place 1 drop into the right eye at bedtime. 04/16/23  Yes [provider]  sertraline (ZOLOFT) 100 MG tablet TAKE 1 TABLET BY MOUTH DAILY FOR ANXIETY AND DEPRESSION 10/09/23  Yes [name] Nest, NP  triamcinolone cream (KENALOG) 0.1 % Apply 1 Application topically 2 (two) times daily. Patient taking differently: Apply 1 Application topically 2 (two) times daily as needed. 08/19/23  Yes Felecia Shelling, DPM  vitamin B-12 (CYANOCOBALAMIN) 1000 MCG tablet Take 1,000 mcg by mouth daily.   Yes [provider]  mirabegron ER (MYRBETRIQ) 50 MG TB24 tablet Take 1 tablet (50 mg total) by mouth daily. For urinary incontinence Patient not taking: Reported on 10/10/2023 08/24/23   [name] Nest, NP  loratadine (CLARITIN) 10 MG tablet Take 10 mg by mouth as needed.   02/15/12  [provider]    Family History  Problem Relation Age of Onset   Cancer Mother        jaw   Hypertension Father    Heart attack Father    Heart attack Sister    Cancer Sister        unknown primary-mets throughout body   Colon cancer Neg Hx    Esophageal cancer Neg Hx    Pancreatic cancer Neg Hx    Stomach cancer Neg Hx    Liver disease Neg Hx      Social History   Tobacco Use   Smoking status: Never   Smokeless tobacco: Never  Vaping Use   Vaping status: Never Used  Substance Use Topics   Alcohol use: No   Drug use: No     Allergies as of 10/09/2023 - Review Complete 10/09/2023  Allergen Reaction Noted   Other Rash and Anaphylaxis 03/04/2015   Shellfish allergy Anaphylaxis and Rash 12/10/2011   Cephalexin  01/10/2008   Ciprofloxacin     Clopidogrel bisulfate     Ezetimibe-simvastatin  01/24/2007   Lidocaine Hives 11/13/2015   Nitrofurantoin     Pregabalin     Celecoxib Rash 02/17/2015   Codeine Rash 02/17/2015   Codeine phosphate Rash 01/24/2007   Hydrocod poli-chlorphe poli er Rash 03/04/2015   Propoxyphene Rash 03/04/2015   Propoxyphene n-acetaminophen Rash    Rofecoxib Rash    Sulfa antibiotics Rash 02/17/2015   Sulfamethoxazole Rash 01/24/2007   Sulfonamide derivatives Rash 01/24/2007    Review of Systems:    All systems reviewed and negative except where noted in HPI.   Physical Exam:  Vital signs in last 24 hours: Temp:  [97.6 F (36.4 C)-98.4 F (36.9 C)] 97.9 F (36.6 C) (11/11 0915) Pulse Rate:  [76-107] 86 (11/11 1130) Resp:  [14-21] 18 (11/11 1130) BP: (89-139)/(47-87) 98/58 (11/11 1130) SpO2:  [93 %-100 %] 99 % (11/11 1130) Weight:  [75.3 kg] 75.3 kg (11/10 1623)   General:   Pleasant, cooperative in NAD area of injury over left brow Head:  Normocephalic and atraumatic. Eyes:   No icterus.   Conjunctiva pink. PERRLA. Ears:  Normal auditory acuity.  Hematoma of the left eye Neck:  Supple; no masses or thyroidomegaly Lungs: Respirations even and unlabored. Lungs clear to auscultation bilaterally.   No wheezes, crackles, or rhonchi.  Heart:  Regular rate and rhythm;  Without murmur, clicks, rubs or gallops Abdomen:  Soft, nondistended, nontender. Normal bowel sounds. No appreciable masses or hepatomegaly.  No rebound  or guarding.  Neurologic:  Alert and oriented x3;  grossly normal neurologically. Skin:  Intact without significant lesions or rashes. Cervical Nodes:  No significant cervical adenopathy. Psych:  Alert and cooperative. Normal affect.  LAB RESULTS: Recent  Labs    10/09/23 1634 10/10/23 0939  WBC 6.3 6.0  HGB 9.2* 6.7*  HCT 28.2* 20.3*  PLT 267 210   BMET Recent Labs    10/09/23 1634 10/10/23 0939  NA 137 137  K 4.7 4.1  CL 104 106  CO2 25 25  GLUCOSE 197* 174*  BUN 52* 66*  CREATININE 1.33* 1.33*  CALCIUM 8.5* 8.0*   LFT Recent Labs    10/10/23 0939  PROT 5.2*  ALBUMIN 2.9*  AST 13*  ALT 8  ALKPHOS 18*  BILITOT 0.4   PT/INR No results for input(s): "LABPROT", "INR" in the last 72 hours.  STUDIES: MR BRAIN WO CONTRAST  Result Date: 10/10/2023 CLINICAL DATA:  Fall with head strike, on Eliquis EXAM: MRI HEAD WITHOUT CONTRAST TECHNIQUE: Multiplanar, multiecho pulse sequences of the brain and surrounding structures were obtained without intravenous contrast. COMPARISON:  10/09/2023 CT head, no prior MRI available FINDINGS: Brain: No restricted diffusion to suggest acute or subacute infarct. No acute hemorrhage, mass, mass effect, or midline shift. No hydrocephalus or extra-axial collection. Pituitary and craniocervical junction within normal limits. Scattered T2 hyperintense signal in the periventricular white matter, likely the sequela of mild chronic small vessel ischemic disease. Vascular: Normal arterial flow voids. Skull and upper cervical spine: Normal marrow signal. Sinuses/Orbits: Mucosal thickening in the maxillary sinuses, left sphenoid sinus, and left greater than right ethmoid air cells. Postsurgical changes in the right globe. Status post bilateral lens replacements. Other: The mastoid air cells are well aerated. IMPRESSION: No acute intracranial process. No evidence of acute or subacute infarct. Electronically Signed   By: Wiliam Ke M.D.   On: 10/10/2023 00:37   CT Head Wo Contrast  Result Date: 10/09/2023 CLINICAL DATA:  Head trauma, minor (Age >= 65y); Neck trauma (Age >= 65y); Facial trauma, blunt EXAM: CT HEAD WITHOUT CONTRAST CT MAXILLOFACIAL WITHOUT CONTRAST CT CERVICAL SPINE WITHOUT CONTRAST TECHNIQUE:  Multidetector CT imaging of the head, cervical spine, and maxillofacial structures were performed using the standard protocol without intravenous contrast. Multiplanar CT image reconstructions of the cervical spine and maxillofacial structures were also generated. RADIATION DOSE REDUCTION: This exam was performed according to the departmental dose-optimization program which includes automated exposure control, adjustment of the mA and/or kV according to patient size and/or use of iterative reconstruction technique. COMPARISON:  CT head 07/13/2019, x-ray cervical spine 02/01/2011 FINDINGS: CT HEAD FINDINGS Brain: No evidence of large-territorial acute infarction. No parenchymal hemorrhage. No mass lesion. No extra-axial collection. No mass effect or midline shift. No hydrocephalus. Basilar cisterns are patent. Vascular: No hyperdense vessel. Atherosclerotic calcifications are present within the cavernous internal carotid and vertebral arteries. Skull: No acute fracture or focal lesion. Other: None. CT MAXILLOFACIAL FINDINGS Osseous: No fracture or mandibular dislocation. No destructive process. Patient is edentulous. Sinuses/Orbits: Left sphenoid sinus mucosal thickening. Bilateral maxillary sinus mucosal thickening with on almost complete opacification. Maxillary sinus wall hypertrophy. Otherwise paranasal sinuses and mastoid air cells are clear. Bilateral lens replacement. Right scleral buckle. Right orbit calcification-likely phleboliths. Otherwise the orbits are unremarkable. Soft tissues: Negative. CT CERVICAL SPINE FINDINGS Alignment: Normal. Skull base and vertebrae: Multilevel mild are moderate degenerative changes of the spine. C6-C7 posterior disc osteophyte complex formation. No associated severe osseous neural foraminal or central canal stenosis.  No acute fracture. No aggressive appearing focal osseous lesion or focal pathologic process. Soft tissues and spinal canal: No prevertebral fluid or swelling. No  visible canal hematoma. Upper chest: Unremarkable. Other: Atherosclerotic plaque of the carotid arteries within the neck. IMPRESSION: 1. No acute intracranial abnormality. 2.  No acute displaced facial fracture. 3. No acute displaced fracture or traumatic listhesis of the cervical spine. 4. Chronic sinus disease. Electronically Signed   By: Tish Frederickson M.D.   On: 10/09/2023 17:33   CT Cervical Spine Wo Contrast  Result Date: 10/09/2023 CLINICAL DATA:  Head trauma, minor (Age >= 65y); Neck trauma (Age >= 65y); Facial trauma, blunt EXAM: CT HEAD WITHOUT CONTRAST CT MAXILLOFACIAL WITHOUT CONTRAST CT CERVICAL SPINE WITHOUT CONTRAST TECHNIQUE: Multidetector CT imaging of the head, cervical spine, and maxillofacial structures were performed using the standard protocol without intravenous contrast. Multiplanar CT image reconstructions of the cervical spine and maxillofacial structures were also generated. RADIATION DOSE REDUCTION: This exam was performed according to the departmental dose-optimization program which includes automated exposure control, adjustment of the mA and/or kV according to patient size and/or use of iterative reconstruction technique. COMPARISON:  CT head 07/13/2019, x-ray cervical spine 02/01/2011 FINDINGS: CT HEAD FINDINGS Brain: No evidence of large-territorial acute infarction. No parenchymal hemorrhage. No mass lesion. No extra-axial collection. No mass effect or midline shift. No hydrocephalus. Basilar cisterns are patent. Vascular: No hyperdense vessel. Atherosclerotic calcifications are present within the cavernous internal carotid and vertebral arteries. Skull: No acute fracture or focal lesion. Other: None. CT MAXILLOFACIAL FINDINGS Osseous: No fracture or mandibular dislocation. No destructive process. Patient is edentulous. Sinuses/Orbits: Left sphenoid sinus mucosal thickening. Bilateral maxillary sinus mucosal thickening with on almost complete opacification. Maxillary sinus  wall hypertrophy. Otherwise paranasal sinuses and mastoid air cells are clear. Bilateral lens replacement. Right scleral buckle. Right orbit calcification-likely phleboliths. Otherwise the orbits are unremarkable. Soft tissues: Negative. CT CERVICAL SPINE FINDINGS Alignment: Normal. Skull base and vertebrae: Multilevel mild are moderate degenerative changes of the spine. C6-C7 posterior disc osteophyte complex formation. No associated severe osseous neural foraminal or central canal stenosis. No acute fracture. No aggressive appearing focal osseous lesion or focal pathologic process. Soft tissues and spinal canal: No prevertebral fluid or swelling. No visible canal hematoma. Upper chest: Unremarkable. Other: Atherosclerotic plaque of the carotid arteries within the neck. IMPRESSION: 1. No acute intracranial abnormality. 2.  No acute displaced facial fracture. 3. No acute displaced fracture or traumatic listhesis of the cervical spine. 4. Chronic sinus disease. Electronically Signed   By: Tish Frederickson M.D.   On: 10/09/2023 17:33   CT Maxillofacial WO CM  Result Date: 10/09/2023 CLINICAL DATA:  Head trauma, minor (Age >= 65y); Neck trauma (Age >= 65y); Facial trauma, blunt EXAM: CT HEAD WITHOUT CONTRAST CT MAXILLOFACIAL WITHOUT CONTRAST CT CERVICAL SPINE WITHOUT CONTRAST TECHNIQUE: Multidetector CT imaging of the head, cervical spine, and maxillofacial structures were performed using the standard protocol without intravenous contrast. Multiplanar CT image reconstructions of the cervical spine and maxillofacial structures were also generated. RADIATION DOSE REDUCTION: This exam was performed according to the departmental dose-optimization program which includes automated exposure control, adjustment of the mA and/or kV according to patient size and/or use of iterative reconstruction technique. COMPARISON:  CT head 07/13/2019, x-ray cervical spine 02/01/2011 FINDINGS: CT HEAD FINDINGS Brain: No evidence of  large-territorial acute infarction. No parenchymal hemorrhage. No mass lesion. No extra-axial collection. No mass effect or midline shift. No hydrocephalus. Basilar cisterns are patent. Vascular: No hyperdense vessel. Atherosclerotic calcifications  are present within the cavernous internal carotid and vertebral arteries. Skull: No acute fracture or focal lesion. Other: None. CT MAXILLOFACIAL FINDINGS Osseous: No fracture or mandibular dislocation. No destructive process. Patient is edentulous. Sinuses/Orbits: Left sphenoid sinus mucosal thickening. Bilateral maxillary sinus mucosal thickening with on almost complete opacification. Maxillary sinus wall hypertrophy. Otherwise paranasal sinuses and mastoid air cells are clear. Bilateral lens replacement. Right scleral buckle. Right orbit calcification-likely phleboliths. Otherwise the orbits are unremarkable. Soft tissues: Negative. CT CERVICAL SPINE FINDINGS Alignment: Normal. Skull base and vertebrae: Multilevel mild are moderate degenerative changes of the spine. C6-C7 posterior disc osteophyte complex formation. No associated severe osseous neural foraminal or central canal stenosis. No acute fracture. No aggressive appearing focal osseous lesion or focal pathologic process. Soft tissues and spinal canal: No prevertebral fluid or swelling. No visible canal hematoma. Upper chest: Unremarkable. Other: Atherosclerotic plaque of the carotid arteries within the neck. IMPRESSION: 1. No acute intracranial abnormality. 2.  No acute displaced facial fracture. 3. No acute displaced fracture or traumatic listhesis of the cervical spine. 4. Chronic sinus disease. Electronically Signed   By: Tish Frederickson M.D.   On: 10/09/2023 17:33      Impression / Plan:   [name] [name] is a 89 y.o. y/o female with a history of DVT, CAD on Eliquis, CKD last dose of Eliquis taken yesterday presents to the hospital after a fall found to have anemia drop in hemoglobin from 9.8 g baseline  to 6.7 g baseline.  Seen by Dr. Rhea [name] earlier in the year for iron deficiency anemia.  The patient was recommended EGD colonoscopy but she refused due to the risks associated at her age.  She does mention over the past few days she has had multiple episodes of nosebleeds and she has probably lost at least half a pint of blood each time and nose bled she denies any hematemesis.  Her stools she states have been dark since she was started on oral iron for the anemia.  Impression: Acute on chronic anemia.  Baseline iron deficiency anemia.  She has a history of nosebleeds which could have presented with the acute problem at this point of time.  No clear evidence of a GI bleed at this point of time  Plan 1.  Patient is undecided about any GI evaluation and in the interim we can proceed with ENT evaluation due to history of epistaxis  2.  For any GI evaluation the Eliquis needs to be prescription for at least 3 doses in view of CKD if she decides to proceed.  The earliest we can consider any GI endoscopy procedure would be Friday after she has skipped 3 doses.  Based on ENT evaluation I will discuss with her yet again whether she would be interested in an upper endoscopy or colonoscopy, both or none or consider to have it done after discussion with her primary GI Dr. Rhea [name] as an outpatient  4.  In the interim monitor CBC and transfuse as needed IV PPI  5.  I will see her again tomorrow Thank you for involving me in the care of this patient.      LOS: 1 day   [name] Mood, MD  10/10/2023, 1:28 PM

## 2023-10-10 NOTE — ED Notes (Signed)
Pt back from MRI in nad. Tolerated well per MRI tech.

## 2023-10-10 NOTE — ED Notes (Signed)
Blood started, VSS. This RN will remain with pt for the first 15 min of starting blood.

## 2023-10-10 NOTE — ED Notes (Signed)
Pre blood vitals obtained and stable at this time   Paper blood consent filed with chart.

## 2023-10-10 NOTE — ED Notes (Signed)
Pt resting at this time. This RN responded to call bell, pt placed on bed pan per request to have a BM. NAD noted. Pt denies any other needs at this time. VSS. Call bell in reach.

## 2023-10-10 NOTE — ED Notes (Signed)
Lying and sitting BP taken, pt refusing to stand at this time due to still feeling like she might fall, both Bps charted in flowsheet.   0500 morning labs obtained.   Meds given, waiting on eye drops from pharmacy. Pt given warm blanket and cola with ice. Pt denies any other needs at this time. NAD noted. VSS. Call bell in reach.

## 2023-10-10 NOTE — ED Notes (Signed)
Pt had a small BM in bed pan, BM was soft and formed and dark in color, pt does endorse taking a blood thinner as well as a iron pill. Pt cleaned up new brief applied. Pt repositioned, call bell in reach.

## 2023-10-10 NOTE — ED Notes (Signed)
Patient transported to MRI 

## 2023-10-10 NOTE — Progress Notes (Addendum)
Progress Note    [name] [name]  XBJ:[id number] DOB: [birth year]  DOA: 10/09/2023 PCP: Doreene Nest, NP      Brief Narrative:    Medical records reviewed and are as summarized below:  [name] [name] is a 89 y.o. female with medical history significant for CAD, hyperlipidemia, right lower extremity DVT (venous duplex positive for age-indeterminate DVT in the right soleal veins), hypertension, non-Hodgkin's lymphoma, type 2 diabetes mellitus, mitral regurgitation, paroxysmal atrial fibrillation on Eliquis, s/p [place] Dual Blade goniotomy right eye on 09/21/2023 for severe primary open-angle glaucoma right eye.  She presented to the hospital after she passed out at home.  She said that she used the toilet at home.  When she got up from the toilet, she felt dizzy and her vision was black.  She took a few steps forward and she passed out and landed on the floor face forward.  She sustained a laceration to the left lateral eyebrow area.  She woke up surrounded by blood on the floor, from the left eye brow laceration. She said she has been having black stools but she attributes this to iron pills.  She was also prescribed Lasix recently for swelling in her legs.  She only took the first dose on the morning of admission.   She was admitted to the hospital for syncope.  Her hemoglobin dropped from 9.8-6.7   Assessment/Plan:   Principal Problem:   Syncope and collapse Active Problems:   Persistent atrial fibrillation (HCC)   DVT (deep venous thrombosis) (HCC)   DETACHED RETINA   Essential hypertension   Coronary artery disease involving native coronary artery of native heart without angina pectoris   Type 2 diabetes mellitus with chronic kidney disease (HCC)   B12 deficiency   Dark stools    Body mass index is 24.51 kg/m.   S/p syncope: Probably due to orthostatic hypotension   Severe anemia, suspect acute blood loss anemia: Transfused 2 units of packed red blood cells.   Discussed risk, benefits of blood transfusion and patient is agreeable with the plan.  Irving Burton, granddaughter, was at the bedside this discussion. Check iron studies Vitamin B12 level was thousand and 34 on 09/30/2023   Black stool/melena: Suspect this is from GI bleeding.  Patient thinks this may be from iron pills because she's had black stools for a while. Consulted Dr. Tobi Bastos, gastroenterologist to assist with management   Left eyebrow laceration: This was sutured in the ED by the ED physician on 10/09/2023   Persistent atrial fibrillation: Hold Eliquis for now because of severe anemia. 2D echo in May 2023 showed EF estimated at 60 to 65%, grade 1 diastolic dysfunction, mild concentric LVH   History of right lower extremity DVT in June 2024: Eliquis on hold   Comorbidities include chronic anemia, CKD stage IIIb, CAD s/p coronary stent, type II DM    Diet Order             Diet heart healthy/carb modified Room service appropriate? Yes; Fluid consistency: Thin  Diet effective now                            Consultants: Gastroenterologist  Procedures: None    Medications:    brimonidine  1 drop Both Eyes TID   dorzolamide-timolol  1 drop Both Eyes BID   gabapentin  100 mg Oral TID   loratadine  10 mg Oral Daily   pantoprazole (  PROTONIX) IV  40 mg Intravenous Q12H   sertraline  100 mg Oral QHS   sodium chloride flush  3 mL Intravenous Q12H   Continuous Infusions:  sodium chloride     lactated ringers 30 mL/hr at 10/10/23 0529     Anti-infectives (From admission, onward)    None              Family Communication/Anticipated D/C date and plan/Code Status   DVT prophylaxis:      Code Status: Full Code  Family Communication: Plan discussed with Irving Burton, granddaughter, at the bedside Disposition Plan: Plan to discharge home   Status is: Inpatient Remains inpatient appropriate because: Severe anemia, s/p syncope       Subjective:    Interval events noted.  She complains of dizziness especially on sitting upright.  She also reports having black stools but she said that she takes iron and that typically makes her stools black/dark.  No hematemesis.  No abdominal pain, shortness of breath or chest pain.  Objective:    Vitals:   10/10/23 0915 10/10/23 0935 10/10/23 0938 10/10/23 1130  BP:  (!) 96/52 113/72 (!) 98/58  Pulse: 91 92 (!) 102 86  Resp: 20 16 20 18   Temp: 97.9 F (36.6 C)     TempSrc: Oral     SpO2: 98% 96% 97% 99%  Weight:      Height:       No data found.   Intake/Output Summary (Last 24 hours) at 10/10/2023 1314 Last data filed at 10/09/2023 2205 Gross per 24 hour  Intake 1000 ml  Output --  Net 1000 ml   Filed Weights   10/09/23 1623  Weight: 75.3 kg    Exam:  GEN: NAD SKIN: Warm and dry EYES: EOMI, pale but anicteric.  Dried blood around left periorbital area ENT: MMM CV: Irregular rate and rhythm PULM: CTA B ABD: soft, ND, NT, +BS CNS: AAO x 3, non focal EXT: No edema or tenderness        Data Reviewed:   I have personally reviewed following labs and imaging studies:  Labs: Labs show the following:   Basic Metabolic Panel: Recent Labs  Lab 10/09/23 1634 10/10/23 0939  NA 137 137  K 4.7 4.1  CL 104 106  CO2 25 25  GLUCOSE 197* 174*  BUN 52* 66*  CREATININE 1.33* 1.33*  CALCIUM 8.5* 8.0*   GFR Estimated Creatinine Clearance: 30 mL/min (A) (by C-G formula based on SCr of 1.33 mg/dL (H)). Liver Function Tests: Recent Labs  Lab 10/09/23 1634 10/10/23 0939  AST 18 13*  ALT 10 8  ALKPHOS 24* 18*  BILITOT 0.6 0.4  PROT 6.4* 5.2*  ALBUMIN 3.6 2.9*   No results for input(s): "LIPASE", "AMYLASE" in the last 168 hours. No results for input(s): "AMMONIA" in the last 168 hours. Coagulation profile No results for input(s): "INR", "PROTIME" in the last 168 hours.  CBC: Recent Labs  Lab 10/09/23 1634 10/10/23 0939  WBC 6.3 6.0  NEUTROABS 4.3  --    HGB 9.2* 6.7*  HCT 28.2* 20.3*  MCV 102.5* 102.0*  PLT 267 210   Cardiac Enzymes: No results for input(s): "CKTOTAL", "CKMB", "CKMBINDEX", "TROPONINI" in the last 168 hours. BNP (last 3 results) No results for input(s): "PROBNP" in the last 8760 hours. CBG: No results for input(s): "GLUCAP" in the last 168 hours. D-Dimer: No results for input(s): "DDIMER" in the last 72 hours. Hgb A1c: No results for input(s): "HGBA1C" in  the last 72 hours. Lipid Profile: No results for input(s): "CHOL", "HDL", "LDLCALC", "TRIG", "CHOLHDL", "LDLDIRECT" in the last 72 hours. Thyroid function studies: Recent Labs    10/09/23 1829  TSH 1.483   Anemia work up: No results for input(s): "VITAMINB12", "FOLATE", "FERRITIN", "TIBC", "IRON", "RETICCTPCT" in the last 72 hours. Sepsis Labs: Recent Labs  Lab 10/09/23 1634 10/10/23 0939  WBC 6.3 6.0    Microbiology No results found for this or any previous visit (from the past 240 hour(s)).  Procedures and diagnostic studies:  MR BRAIN WO CONTRAST  Result Date: 10/10/2023 CLINICAL DATA:  Fall with head strike, on Eliquis EXAM: MRI HEAD WITHOUT CONTRAST TECHNIQUE: Multiplanar, multiecho pulse sequences of the brain and surrounding structures were obtained without intravenous contrast. COMPARISON:  10/09/2023 CT head, no prior MRI available FINDINGS: Brain: No restricted diffusion to suggest acute or subacute infarct. No acute hemorrhage, mass, mass effect, or midline shift. No hydrocephalus or extra-axial collection. Pituitary and craniocervical junction within normal limits. Scattered T2 hyperintense signal in the periventricular white matter, likely the sequela of mild chronic small vessel ischemic disease. Vascular: Normal arterial flow voids. Skull and upper cervical spine: Normal marrow signal. Sinuses/Orbits: Mucosal thickening in the maxillary sinuses, left sphenoid sinus, and left greater than right ethmoid air cells. Postsurgical changes in the  right globe. Status post bilateral lens replacements. Other: The mastoid air cells are well aerated. IMPRESSION: No acute intracranial process. No evidence of acute or subacute infarct. Electronically Signed   By: Wiliam Ke M.D.   On: 10/10/2023 00:37   CT Head Wo Contrast  Result Date: 10/09/2023 CLINICAL DATA:  Head trauma, minor (Age >= 65y); Neck trauma (Age >= 65y); Facial trauma, blunt EXAM: CT HEAD WITHOUT CONTRAST CT MAXILLOFACIAL WITHOUT CONTRAST CT CERVICAL SPINE WITHOUT CONTRAST TECHNIQUE: Multidetector CT imaging of the head, cervical spine, and maxillofacial structures were performed using the standard protocol without intravenous contrast. Multiplanar CT image reconstructions of the cervical spine and maxillofacial structures were also generated. RADIATION DOSE REDUCTION: This exam was performed according to the departmental dose-optimization program which includes automated exposure control, adjustment of the mA and/or kV according to patient size and/or use of iterative reconstruction technique. COMPARISON:  CT head 07/13/2019, x-ray cervical spine 02/01/2011 FINDINGS: CT HEAD FINDINGS Brain: No evidence of large-territorial acute infarction. No parenchymal hemorrhage. No mass lesion. No extra-axial collection. No mass effect or midline shift. No hydrocephalus. Basilar cisterns are patent. Vascular: No hyperdense vessel. Atherosclerotic calcifications are present within the cavernous internal carotid and vertebral arteries. Skull: No acute fracture or focal lesion. Other: None. CT MAXILLOFACIAL FINDINGS Osseous: No fracture or mandibular dislocation. No destructive process. Patient is edentulous. Sinuses/Orbits: Left sphenoid sinus mucosal thickening. Bilateral maxillary sinus mucosal thickening with on almost complete opacification. Maxillary sinus wall hypertrophy. Otherwise paranasal sinuses and mastoid air cells are clear. Bilateral lens replacement. Right scleral buckle. Right orbit  calcification-likely phleboliths. Otherwise the orbits are unremarkable. Soft tissues: Negative. CT CERVICAL SPINE FINDINGS Alignment: Normal. Skull base and vertebrae: Multilevel mild are moderate degenerative changes of the spine. C6-C7 posterior disc osteophyte complex formation. No associated severe osseous neural foraminal or central canal stenosis. No acute fracture. No aggressive appearing focal osseous lesion or focal pathologic process. Soft tissues and spinal canal: No prevertebral fluid or swelling. No visible canal hematoma. Upper chest: Unremarkable. Other: Atherosclerotic plaque of the carotid arteries within the neck. IMPRESSION: 1. No acute intracranial abnormality. 2.  No acute displaced facial fracture. 3. No acute displaced fracture or  traumatic listhesis of the cervical spine. 4. Chronic sinus disease. Electronically Signed   By: Tish Frederickson M.D.   On: 10/09/2023 17:33   CT Cervical Spine Wo Contrast  Result Date: 10/09/2023 CLINICAL DATA:  Head trauma, minor (Age >= 65y); Neck trauma (Age >= 65y); Facial trauma, blunt EXAM: CT HEAD WITHOUT CONTRAST CT MAXILLOFACIAL WITHOUT CONTRAST CT CERVICAL SPINE WITHOUT CONTRAST TECHNIQUE: Multidetector CT imaging of the head, cervical spine, and maxillofacial structures were performed using the standard protocol without intravenous contrast. Multiplanar CT image reconstructions of the cervical spine and maxillofacial structures were also generated. RADIATION DOSE REDUCTION: This exam was performed according to the departmental dose-optimization program which includes automated exposure control, adjustment of the mA and/or kV according to patient size and/or use of iterative reconstruction technique. COMPARISON:  CT head 07/13/2019, x-ray cervical spine 02/01/2011 FINDINGS: CT HEAD FINDINGS Brain: No evidence of large-territorial acute infarction. No parenchymal hemorrhage. No mass lesion. No extra-axial collection. No mass effect or midline shift.  No hydrocephalus. Basilar cisterns are patent. Vascular: No hyperdense vessel. Atherosclerotic calcifications are present within the cavernous internal carotid and vertebral arteries. Skull: No acute fracture or focal lesion. Other: None. CT MAXILLOFACIAL FINDINGS Osseous: No fracture or mandibular dislocation. No destructive process. Patient is edentulous. Sinuses/Orbits: Left sphenoid sinus mucosal thickening. Bilateral maxillary sinus mucosal thickening with on almost complete opacification. Maxillary sinus wall hypertrophy. Otherwise paranasal sinuses and mastoid air cells are clear. Bilateral lens replacement. Right scleral buckle. Right orbit calcification-likely phleboliths. Otherwise the orbits are unremarkable. Soft tissues: Negative. CT CERVICAL SPINE FINDINGS Alignment: Normal. Skull base and vertebrae: Multilevel mild are moderate degenerative changes of the spine. C6-C7 posterior disc osteophyte complex formation. No associated severe osseous neural foraminal or central canal stenosis. No acute fracture. No aggressive appearing focal osseous lesion or focal pathologic process. Soft tissues and spinal canal: No prevertebral fluid or swelling. No visible canal hematoma. Upper chest: Unremarkable. Other: Atherosclerotic plaque of the carotid arteries within the neck. IMPRESSION: 1. No acute intracranial abnormality. 2.  No acute displaced facial fracture. 3. No acute displaced fracture or traumatic listhesis of the cervical spine. 4. Chronic sinus disease. Electronically Signed   By: Tish Frederickson M.D.   On: 10/09/2023 17:33   CT Maxillofacial WO CM  Result Date: 10/09/2023 CLINICAL DATA:  Head trauma, minor (Age >= 65y); Neck trauma (Age >= 65y); Facial trauma, blunt EXAM: CT HEAD WITHOUT CONTRAST CT MAXILLOFACIAL WITHOUT CONTRAST CT CERVICAL SPINE WITHOUT CONTRAST TECHNIQUE: Multidetector CT imaging of the head, cervical spine, and maxillofacial structures were performed using the standard  protocol without intravenous contrast. Multiplanar CT image reconstructions of the cervical spine and maxillofacial structures were also generated. RADIATION DOSE REDUCTION: This exam was performed according to the departmental dose-optimization program which includes automated exposure control, adjustment of the mA and/or kV according to patient size and/or use of iterative reconstruction technique. COMPARISON:  CT head 07/13/2019, x-ray cervical spine 02/01/2011 FINDINGS: CT HEAD FINDINGS Brain: No evidence of large-territorial acute infarction. No parenchymal hemorrhage. No mass lesion. No extra-axial collection. No mass effect or midline shift. No hydrocephalus. Basilar cisterns are patent. Vascular: No hyperdense vessel. Atherosclerotic calcifications are present within the cavernous internal carotid and vertebral arteries. Skull: No acute fracture or focal lesion. Other: None. CT MAXILLOFACIAL FINDINGS Osseous: No fracture or mandibular dislocation. No destructive process. Patient is edentulous. Sinuses/Orbits: Left sphenoid sinus mucosal thickening. Bilateral maxillary sinus mucosal thickening with on almost complete opacification. Maxillary sinus wall hypertrophy. Otherwise paranasal sinuses and mastoid air  cells are clear. Bilateral lens replacement. Right scleral buckle. Right orbit calcification-likely phleboliths. Otherwise the orbits are unremarkable. Soft tissues: Negative. CT CERVICAL SPINE FINDINGS Alignment: Normal. Skull base and vertebrae: Multilevel mild are moderate degenerative changes of the spine. C6-C7 posterior disc osteophyte complex formation. No associated severe osseous neural foraminal or central canal stenosis. No acute fracture. No aggressive appearing focal osseous lesion or focal pathologic process. Soft tissues and spinal canal: No prevertebral fluid or swelling. No visible canal hematoma. Upper chest: Unremarkable. Other: Atherosclerotic plaque of the carotid arteries within the  neck. IMPRESSION: 1. No acute intracranial abnormality. 2.  No acute displaced facial fracture. 3. No acute displaced fracture or traumatic listhesis of the cervical spine. 4. Chronic sinus disease. Electronically Signed   By: Tish Frederickson M.D.   On: 10/09/2023 17:33               LOS: 1 day   Maryna Yeagle  [facility]   Pager on [email or web address]. If 7PM-7AM, please contact night-coverage at [email or web address]     10/10/2023, 1:14 PM

## 2023-10-11 ENCOUNTER — Inpatient Hospital Stay (HOSPITAL_COMMUNITY)
Admit: 2023-10-11 | Discharge: 2023-10-11 | Disposition: A | Discharge status: Home / Self Care | Payer: Medicare PPO | Attending: Internal Medicine | Admitting: Internal Medicine

## 2023-10-11 DIAGNOSIS — D62 Acute posthemorrhagic anemia: Secondary | ICD-10-CM

## 2023-10-11 DIAGNOSIS — R55 Syncope and collapse: Secondary | ICD-10-CM | POA: Diagnosis not present

## 2023-10-11 DIAGNOSIS — Z8669 Personal history of other diseases of the nervous system and sense organs: Secondary | ICD-10-CM | POA: Diagnosis not present

## 2023-10-11 DIAGNOSIS — D509 Iron deficiency anemia, unspecified: Secondary | ICD-10-CM | POA: Diagnosis not present

## 2023-10-11 LAB — BPAM RBC
Blood Product Expiration Date: 202412062359
Blood Product Expiration Date: 202412112359
ISSUE DATE / TIME: 202411111326
ISSUE DATE / TIME: 202411112203
Unit Type and Rh: 6200
Unit Type and Rh: 6200

## 2023-10-11 LAB — TYPE AND SCREEN
ABO/RH(D): A POS
Antibody Screen: NEGATIVE
Unit division: 0
Unit division: 0

## 2023-10-11 LAB — CBC WITH DIFFERENTIAL/PLATELET
Abs Immature Granulocytes: 0.06 10*3/uL (ref 0.00–0.07)
Basophils Absolute: 0 10*3/uL (ref 0.0–0.1)
Basophils Relative: 0 %
Eosinophils Absolute: 0.1 10*3/uL (ref 0.0–0.5)
Eosinophils Relative: 2 %
HCT: 26.8 % — ABNORMAL LOW (ref 36.0–46.0)
Hemoglobin: 9 g/dL — ABNORMAL LOW (ref 12.0–15.0)
Immature Granulocytes: 1 %
Lymphocytes Relative: 35 %
Lymphs Abs: 1.7 10*3/uL (ref 0.7–4.0)
MCH: 32.3 pg (ref 26.0–34.0)
MCHC: 33.6 g/dL (ref 30.0–36.0)
MCV: 96.1 fL (ref 80.0–100.0)
Monocytes Absolute: 0.3 10*3/uL (ref 0.1–1.0)
Monocytes Relative: 7 %
Neutro Abs: 2.6 10*3/uL (ref 1.7–7.7)
Neutrophils Relative %: 55 %
Platelets: 192 10*3/uL (ref 150–400)
RBC: 2.79 MIL/uL — ABNORMAL LOW (ref 3.87–5.11)
RDW: 18.4 % — ABNORMAL HIGH (ref 11.5–15.5)
WBC: 4.9 10*3/uL (ref 4.0–10.5)
nRBC: 0.6 % — ABNORMAL HIGH (ref 0.0–0.2)

## 2023-10-11 LAB — BASIC METABOLIC PANEL
Anion gap: 8 (ref 5–15)
BUN: 54 mg/dL — ABNORMAL HIGH (ref 8–23)
CO2: 25 mmol/L (ref 22–32)
Calcium: 8.3 mg/dL — ABNORMAL LOW (ref 8.9–10.3)
Chloride: 106 mmol/L (ref 98–111)
Creatinine, Ser: 1.39 mg/dL — ABNORMAL HIGH (ref 0.44–1.00)
GFR, Estimated: 36 mL/min — ABNORMAL LOW (ref >60)
Glucose, Bld: 123 mg/dL — ABNORMAL HIGH (ref 70–99)
Potassium: 4.4 mmol/L (ref 3.5–5.1)
Sodium: 139 mmol/L (ref 135–145)

## 2023-10-11 LAB — ECHOCARDIOGRAM COMPLETE BUBBLE STUDY
AR max vel: 2.22 cm2
AV Area VTI: 2.16 cm2
AV Area mean vel: 2.06 cm2
AV Mean grad: 3 mm[Hg]
AV Peak grad: 5.3 mm[Hg]
Ao pk vel: 1.15 m/s
Area-P 1/2: 3.4 cm2
S' Lateral: 2.9 cm

## 2023-10-11 MED ORDER — ACETAMINOPHEN 325 MG PO TABS
650.0000 mg | ORAL_TABLET | Freq: Four times a day (QID) | ORAL | Status: DC | PRN
  Administered 2023-10-11 – 2023-10-14 (×3): 650 mg via ORAL
  Filled 2023-10-11 (×4): qty 2

## 2023-10-11 NOTE — Plan of Care (Signed)

## 2023-10-11 NOTE — Progress Notes (Addendum)
Progress Note    [name] [name]  OVF:[id number] DOB: [birth year]  DOA: 10/09/2023 PCP: Doreene Nest, NP      Brief Narrative:    Medical records reviewed and are as summarized below:  [name] [name] is a 89 y.o. female with medical history significant for CAD, hyperlipidemia, right lower extremity DVT (venous duplex positive for age-indeterminate DVT in the right soleal veins), hypertension, non-Hodgkin's lymphoma, type 2 diabetes mellitus, mitral regurgitation, paroxysmal atrial fibrillation on Eliquis, s/p [place] Dual Blade goniotomy right eye on 09/21/2023 for severe primary open-angle glaucoma right eye.  She presented to the hospital after she passed out at home.  She said that she used the toilet at home.  When she got up from the toilet, she felt dizzy and her vision was black.  She took a few steps forward and she passed out and landed on the floor face forward.  She sustained a laceration to the left lateral eyebrow area.  She woke up surrounded by blood on the floor, from the left eye brow laceration. She said she has been having black stools but she attributes this to iron pills.  She was also prescribed Lasix recently for swelling in her legs.  She only took the first dose on the morning of admission. She had a 2-day history of epistaxis about a week prior to admission.  She had significant bleeding from her left nose which stopped spontaneously.  The following day she had another episode of nosebleed from the left nose but this was mild.  She has not had any epistaxis since then.   She was admitted to the hospital for syncope.  Her hemoglobin dropped from 9.8-6.7   Assessment/Plan:   Principal Problem:   Syncope and collapse Active Problems:   Persistent atrial fibrillation (HCC)   DVT (deep venous thrombosis) (HCC)   DETACHED RETINA   Essential hypertension   Coronary artery disease involving native coronary artery of native heart without angina pectoris   Type 2  diabetes mellitus with chronic kidney disease (HCC)   B12 deficiency   Dark stools   Acute post-hemorrhagic anemia    Body mass index is 24.51 kg/m.   S/p syncope: Probably due to orthostatic hypotension   Severe anemia, suspect acute blood loss anemia, iron deficiency anemia: Hemoglobin up from 6.7-9.  S/p transfusion with 2 units of PRBCs on 10/10/2023.   Dizziness has improved after blood transfusion. Iron 248, TIBC 304, saturation ratio 82 and ferritin 28 Vitamin B12 level was 1,034 on 09/30/2023   Black stool/?melena: Suspect this is from GI bleeding.  Patient thinks this may be from iron pills because she's had black stools for a while. Patient has been evaluated by Dr. Tobi Bastos, gastroenterologist.  He requested ENT evaluation.  Earliest date endoscopy can be done is on Friday, 10/14/2023 if patient agrees.   Recent epistaxis from left nose about a week prior to admission: Consulted Dr. Linus Salmons, otolaryngologist.  He said there is nothing for ENT to do at this time since patient is not having epistaxis at this time.   Left eyebrow laceration: This was sutured in the ED by the ED physician on 10/09/2023   Persistent atrial fibrillation: Requested still on hold in anticipation of endoscopic workup.. 2D echo in May 2023 showed EF estimated at 60 to 65%, grade 1 diastolic dysfunction, mild concentric LVH   History of right lower extremity DVT in June 2024: Eliquis on hold   Comorbidities include chronic anemia,  CKD stage IIIb, CAD s/p coronary stent, type II DM    Diet Order             Diet heart healthy/carb modified Room service appropriate? Yes; Fluid consistency: Thin  Diet effective now                            Consultants: Gastroenterologist  Procedures: None    Medications:    brimonidine  1 drop Both Eyes TID   dorzolamide-timolol  1 drop Both Eyes BID   gabapentin  100 mg Oral TID   loratadine  10 mg Oral Daily   mupirocin  ointment  1 Application Nasal BID   pantoprazole (PROTONIX) IV  40 mg Intravenous Q12H   sertraline  100 mg Oral QHS   sodium chloride flush  3 mL Intravenous Q12H   Continuous Infusions:     Anti-infectives (From admission, onward)    None              Family Communication/Anticipated D/C date and plan/Code Status   DVT prophylaxis:      Code Status: Full Code  Family Communication: None Disposition Plan: Plan to discharge home   Status is: Inpatient Remains inpatient appropriate because: Severe anemia, s/p syncope       Subjective:   Interval events noted.  No bowel movement overnight.  Dizziness has improved she feels better today.  No nosebleed or hematemesis.  Objective:    Vitals:   10/10/23 2345 10/11/23 0113 10/11/23 0338 10/11/23 0847  BP: 115/67 114/69 118/64 126/68  Pulse: 95 87 85 93  Resp: 20 18 18 18   Temp: 98.1 F (36.7 C) 98.4 F (36.9 C) 98 F (36.7 C) 98.3 F (36.8 C)  TempSrc: Oral Oral Oral   SpO2: 99% 100% 100% 98%  Weight:      Height:       Orthostatic VS for the past 24 hrs:  BP- Lying Pulse- Lying BP- Sitting Pulse- Sitting BP- Standing at 0 minutes Pulse- Standing at 0 minutes  10/10/23 1659 113/57 89 101/70 95 94/54 68     Intake/Output Summary (Last 24 hours) at 10/11/2023 1010 Last data filed at 10/11/2023 0115 Gross per 24 hour  Intake 588 ml  Output --  Net 588 ml   Filed Weights   10/09/23 1623  Weight: 75.3 kg    Exam:  GEN: NAD SKIN: Warm and dry EYES: No pallor or icterus ENT: MMM CV: Irregular rate and rhythm PULM: CTA B ABD: soft, ND, NT, +BS CNS: AAO x 3, non focal EXT: No edema or tenderness      Data Reviewed:   I have personally reviewed following labs and imaging studies:  Labs: Labs show the following:   Basic Metabolic Panel: Recent Labs  Lab 10/09/23 1634 10/10/23 0939 10/11/23 0503  NA 137 137 139  K 4.7 4.1 4.4  CL 104 106 106  CO2 25 25 25   GLUCOSE 197*  174* 123*  BUN 52* 66* 54*  CREATININE 1.33* 1.33* 1.39*  CALCIUM 8.5* 8.0* 8.3*   GFR Estimated Creatinine Clearance: 28.7 mL/min (A) (by C-G formula based on SCr of 1.39 mg/dL (H)). Liver Function Tests: Recent Labs  Lab 10/09/23 1634 10/10/23 0939  AST 18 13*  ALT 10 8  ALKPHOS 24* 18*  BILITOT 0.6 0.4  PROT 6.4* 5.2*  ALBUMIN 3.6 2.9*   No results for input(s): "LIPASE", "AMYLASE" in the last 168 hours.  No results for input(s): "AMMONIA" in the last 168 hours. Coagulation profile No results for input(s): "INR", "PROTIME" in the last 168 hours.  CBC: Recent Labs  Lab 10/09/23 1634 10/10/23 0939 10/10/23 1830 10/11/23 0503  WBC 6.3 6.0  --  4.9  NEUTROABS 4.3  --   --  2.6  HGB 9.2* 6.7* 7.6* 9.0*  HCT 28.2* 20.3* 23.4* 26.8*  MCV 102.5* 102.0*  --  96.1  PLT 267 210  --  192   Cardiac Enzymes: No results for input(s): "CKTOTAL", "CKMB", "CKMBINDEX", "TROPONINI" in the last 168 hours. BNP (last 3 results) No results for input(s): "PROBNP" in the last 8760 hours. CBG: No results for input(s): "GLUCAP" in the last 168 hours. D-Dimer: No results for input(s): "DDIMER" in the last 72 hours. Hgb A1c: No results for input(s): "HGBA1C" in the last 72 hours. Lipid Profile: No results for input(s): "CHOL", "HDL", "LDLCALC", "TRIG", "CHOLHDL", "LDLDIRECT" in the last 72 hours. Thyroid function studies: Recent Labs    10/09/23 1829  TSH 1.483   Anemia work up: Recent Labs    10/10/23 0939  FERRITIN 28  TIBC 304  IRON 248*   Sepsis Labs: Recent Labs  Lab 10/09/23 1634 10/10/23 0939 10/11/23 0503  WBC 6.3 6.0 4.9    Microbiology No results found for this or any previous visit (from the past 240 hour(s)).  Procedures and diagnostic studies:  MR BRAIN WO CONTRAST  Result Date: 10/10/2023 CLINICAL DATA:  Fall with head strike, on Eliquis EXAM: MRI HEAD WITHOUT CONTRAST TECHNIQUE: Multiplanar, multiecho pulse sequences of the brain and surrounding  structures were obtained without intravenous contrast. COMPARISON:  10/09/2023 CT head, no prior MRI available FINDINGS: Brain: No restricted diffusion to suggest acute or subacute infarct. No acute hemorrhage, mass, mass effect, or midline shift. No hydrocephalus or extra-axial collection. Pituitary and craniocervical junction within normal limits. Scattered T2 hyperintense signal in the periventricular white matter, likely the sequela of mild chronic small vessel ischemic disease. Vascular: Normal arterial flow voids. Skull and upper cervical spine: Normal marrow signal. Sinuses/Orbits: Mucosal thickening in the maxillary sinuses, left sphenoid sinus, and left greater than right ethmoid air cells. Postsurgical changes in the right globe. Status post bilateral lens replacements. Other: The mastoid air cells are well aerated. IMPRESSION: No acute intracranial process. No evidence of acute or subacute infarct. Electronically Signed   By: Wiliam Ke M.D.   On: 10/10/2023 00:37   CT Head Wo Contrast  Result Date: 10/09/2023 CLINICAL DATA:  Head trauma, minor (Age >= 65y); Neck trauma (Age >= 65y); Facial trauma, blunt EXAM: CT HEAD WITHOUT CONTRAST CT MAXILLOFACIAL WITHOUT CONTRAST CT CERVICAL SPINE WITHOUT CONTRAST TECHNIQUE: Multidetector CT imaging of the head, cervical spine, and maxillofacial structures were performed using the standard protocol without intravenous contrast. Multiplanar CT image reconstructions of the cervical spine and maxillofacial structures were also generated. RADIATION DOSE REDUCTION: This exam was performed according to the departmental dose-optimization program which includes automated exposure control, adjustment of the mA and/or kV according to patient size and/or use of iterative reconstruction technique. COMPARISON:  CT head 07/13/2019, x-ray cervical spine 02/01/2011 FINDINGS: CT HEAD FINDINGS Brain: No evidence of large-territorial acute infarction. No parenchymal hemorrhage.  No mass lesion. No extra-axial collection. No mass effect or midline shift. No hydrocephalus. Basilar cisterns are patent. Vascular: No hyperdense vessel. Atherosclerotic calcifications are present within the cavernous internal carotid and vertebral arteries. Skull: No acute fracture or focal lesion. Other: None. CT MAXILLOFACIAL FINDINGS Osseous: No fracture  or mandibular dislocation. No destructive process. Patient is edentulous. Sinuses/Orbits: Left sphenoid sinus mucosal thickening. Bilateral maxillary sinus mucosal thickening with on almost complete opacification. Maxillary sinus wall hypertrophy. Otherwise paranasal sinuses and mastoid air cells are clear. Bilateral lens replacement. Right scleral buckle. Right orbit calcification-likely phleboliths. Otherwise the orbits are unremarkable. Soft tissues: Negative. CT CERVICAL SPINE FINDINGS Alignment: Normal. Skull base and vertebrae: Multilevel mild are moderate degenerative changes of the spine. C6-C7 posterior disc osteophyte complex formation. No associated severe osseous neural foraminal or central canal stenosis. No acute fracture. No aggressive appearing focal osseous lesion or focal pathologic process. Soft tissues and spinal canal: No prevertebral fluid or swelling. No visible canal hematoma. Upper chest: Unremarkable. Other: Atherosclerotic plaque of the carotid arteries within the neck. IMPRESSION: 1. No acute intracranial abnormality. 2.  No acute displaced facial fracture. 3. No acute displaced fracture or traumatic listhesis of the cervical spine. 4. Chronic sinus disease. Electronically Signed   By: Tish Frederickson M.D.   On: 10/09/2023 17:33   CT Cervical Spine Wo Contrast  Result Date: 10/09/2023 CLINICAL DATA:  Head trauma, minor (Age >= 65y); Neck trauma (Age >= 65y); Facial trauma, blunt EXAM: CT HEAD WITHOUT CONTRAST CT MAXILLOFACIAL WITHOUT CONTRAST CT CERVICAL SPINE WITHOUT CONTRAST TECHNIQUE: Multidetector CT imaging of the head,  cervical spine, and maxillofacial structures were performed using the standard protocol without intravenous contrast. Multiplanar CT image reconstructions of the cervical spine and maxillofacial structures were also generated. RADIATION DOSE REDUCTION: This exam was performed according to the departmental dose-optimization program which includes automated exposure control, adjustment of the mA and/or kV according to patient size and/or use of iterative reconstruction technique. COMPARISON:  CT head 07/13/2019, x-ray cervical spine 02/01/2011 FINDINGS: CT HEAD FINDINGS Brain: No evidence of large-territorial acute infarction. No parenchymal hemorrhage. No mass lesion. No extra-axial collection. No mass effect or midline shift. No hydrocephalus. Basilar cisterns are patent. Vascular: No hyperdense vessel. Atherosclerotic calcifications are present within the cavernous internal carotid and vertebral arteries. Skull: No acute fracture or focal lesion. Other: None. CT MAXILLOFACIAL FINDINGS Osseous: No fracture or mandibular dislocation. No destructive process. Patient is edentulous. Sinuses/Orbits: Left sphenoid sinus mucosal thickening. Bilateral maxillary sinus mucosal thickening with on almost complete opacification. Maxillary sinus wall hypertrophy. Otherwise paranasal sinuses and mastoid air cells are clear. Bilateral lens replacement. Right scleral buckle. Right orbit calcification-likely phleboliths. Otherwise the orbits are unremarkable. Soft tissues: Negative. CT CERVICAL SPINE FINDINGS Alignment: Normal. Skull base and vertebrae: Multilevel mild are moderate degenerative changes of the spine. C6-C7 posterior disc osteophyte complex formation. No associated severe osseous neural foraminal or central canal stenosis. No acute fracture. No aggressive appearing focal osseous lesion or focal pathologic process. Soft tissues and spinal canal: No prevertebral fluid or swelling. No visible canal hematoma. Upper chest:  Unremarkable. Other: Atherosclerotic plaque of the carotid arteries within the neck. IMPRESSION: 1. No acute intracranial abnormality. 2.  No acute displaced facial fracture. 3. No acute displaced fracture or traumatic listhesis of the cervical spine. 4. Chronic sinus disease. Electronically Signed   By: Tish Frederickson M.D.   On: 10/09/2023 17:33   CT Maxillofacial WO CM  Result Date: 10/09/2023 CLINICAL DATA:  Head trauma, minor (Age >= 65y); Neck trauma (Age >= 65y); Facial trauma, blunt EXAM: CT HEAD WITHOUT CONTRAST CT MAXILLOFACIAL WITHOUT CONTRAST CT CERVICAL SPINE WITHOUT CONTRAST TECHNIQUE: Multidetector CT imaging of the head, cervical spine, and maxillofacial structures were performed using the standard protocol without intravenous contrast. Multiplanar CT image reconstructions of  the cervical spine and maxillofacial structures were also generated. RADIATION DOSE REDUCTION: This exam was performed according to the departmental dose-optimization program which includes automated exposure control, adjustment of the mA and/or kV according to patient size and/or use of iterative reconstruction technique. COMPARISON:  CT head 07/13/2019, x-ray cervical spine 02/01/2011 FINDINGS: CT HEAD FINDINGS Brain: No evidence of large-territorial acute infarction. No parenchymal hemorrhage. No mass lesion. No extra-axial collection. No mass effect or midline shift. No hydrocephalus. Basilar cisterns are patent. Vascular: No hyperdense vessel. Atherosclerotic calcifications are present within the cavernous internal carotid and vertebral arteries. Skull: No acute fracture or focal lesion. Other: None. CT MAXILLOFACIAL FINDINGS Osseous: No fracture or mandibular dislocation. No destructive process. Patient is edentulous. Sinuses/Orbits: Left sphenoid sinus mucosal thickening. Bilateral maxillary sinus mucosal thickening with on almost complete opacification. Maxillary sinus wall hypertrophy. Otherwise paranasal sinuses  and mastoid air cells are clear. Bilateral lens replacement. Right scleral buckle. Right orbit calcification-likely phleboliths. Otherwise the orbits are unremarkable. Soft tissues: Negative. CT CERVICAL SPINE FINDINGS Alignment: Normal. Skull base and vertebrae: Multilevel mild are moderate degenerative changes of the spine. C6-C7 posterior disc osteophyte complex formation. No associated severe osseous neural foraminal or central canal stenosis. No acute fracture. No aggressive appearing focal osseous lesion or focal pathologic process. Soft tissues and spinal canal: No prevertebral fluid or swelling. No visible canal hematoma. Upper chest: Unremarkable. Other: Atherosclerotic plaque of the carotid arteries within the neck. IMPRESSION: 1. No acute intracranial abnormality. 2.  No acute displaced facial fracture. 3. No acute displaced fracture or traumatic listhesis of the cervical spine. 4. Chronic sinus disease. Electronically Signed   By: Tish Frederickson M.D.   On: 10/09/2023 17:33               LOS: 2 days   Sammie Denner  [facility]   Pager on [email or web address]. If 7PM-7AM, please contact night-coverage at [email or web address]     10/11/2023, 10:10 AM

## 2023-10-11 NOTE — Progress Notes (Signed)
*  PRELIMINARY RESULTS* Echocardiogram 2D Echocardiogram has been performed.  Cristela Blue 10/11/2023, 8:49 AM

## 2023-10-11 NOTE — Progress Notes (Signed)
[name] , MD [address], [address], [place], [place], [address] [address], [address], [place], [place], [address] Phone: [phone]  Fax: [phone]   [name] [name] is being followed for anemia day 1 of follow up   Subjective: Doing well no change in bowel habits no blood in her stool.  No further nosebleeds   Objective: Vital signs in last 24 hours: Vitals:   10/11/23 0113 10/11/23 0338 10/11/23 0847 10/11/23 1150  BP: 114/69 118/64 126/68 (!) 101/57  Pulse: 87 85 93 (!) 105  Resp: 18 18 18 18   Temp: 98.4 F (36.9 C) 98 F (36.7 C) 98.3 F (36.8 C) 97.9 F (36.6 C)  TempSrc: Oral Oral    SpO2: 100% 100% 98% 97%  Weight:      Height:       Weight change:   Intake/Output Summary (Last 24 hours) at 10/11/2023 1302 Last data filed at 10/11/2023 1138 Gross per 24 hour  Intake 828 ml  Output --  Net 828 ml     Exam:  Abdomen: soft, nontender, normal bowel sounds   Lab Results: @LABTEST2 @ Micro Results: No results found for this or any previous visit (from the past 240 hour(s)). Studies/Results: ECHOCARDIOGRAM COMPLETE BUBBLE STUDY  Result Date: 10/11/2023    ECHOCARDIOGRAM REPORT   Patient Name:   [name] [name] [facility] Date of Exam: 10/11/2023 Medical Rec #:  [id number]     Height:       69.0 in Accession #:    [id number]    Weight:       166.0 lb Date of Birth:  07-20-1932      BSA:          1.909 m Patient Age:    89 years      BP:           118/64 mmHg Patient Gender: F             HR:           85 bpm. Exam Location:  [facility] Procedure: 2D Echo, Cardiac Doppler, Saline Contrast Bubble Study and Color            Doppler Indications:     Syncope 780.2 /R55  History:         Patient has prior history of Echocardiogram examinations, most                  recent 04/07/2022. Arrythmias:Atrial Fibrillation. DVT.  Sonographer:     Cristela Blue Referring Phys:  [id number] Eliezer Mccoy PATEL Diagnosing Phys: Yvonne Kendall MD IMPRESSIONS  1. Left ventricular ejection fraction, by  estimation, is 60 to 65%. The left ventricle has normal function. The left ventricle has no regional wall motion abnormalities. There is mild left ventricular hypertrophy. Left ventricular diastolic function could not be evaluated.  2. Right ventricular systolic function is normal. The right ventricular size is normal. Mildly increased right ventricular wall thickness. Tricuspid regurgitation signal is inadequate for assessing PA pressure.  3. Left atrial size was borderline dilated.  4. The mitral valve is degenerative. Trivial mitral valve regurgitation. Moderate mitral annular calcification.  5. The aortic valve is tricuspid. There is mild calcification of the aortic valve. There is mild thickening of the aortic valve. Aortic valve regurgitation is trivial. Aortic valve sclerosis/calcification is present, without any evidence of aortic stenosis.  6. The inferior vena cava is normal in size with <50% respiratory variability, suggesting right atrial pressure of 8 mmHg.  7. Bubble study is nondiagnostic for intracardiac shunting. FINDINGS  Left Ventricle: Left ventricular ejection fraction, by estimation, is 60 to 65%. The left ventricle has normal function. The left ventricle has no regional wall motion abnormalities. The left ventricular internal cavity size was normal in size. There is  mild left ventricular hypertrophy. Left ventricular diastolic function could not be evaluated due to atrial fibrillation. Left ventricular diastolic function could not be evaluated. Right Ventricle: The right ventricular size is normal. Mildly increased right ventricular wall thickness. Right ventricular systolic function is normal. Tricuspid regurgitation signal is inadequate for assessing PA pressure. Left Atrium: Left atrial size was borderline dilated. Right Atrium: Right atrial size was normal in size. Pericardium: There is no evidence of pericardial effusion. Mitral Valve: The mitral valve is degenerative in appearance.  There is mild calcification of the mitral valve leaflet(s). Moderate mitral annular calcification. Trivial mitral valve regurgitation. Tricuspid Valve: The tricuspid valve is grossly normal. Tricuspid valve regurgitation is trivial. Aortic Valve: The aortic valve is tricuspid. There is mild calcification of the aortic valve. There is mild thickening of the aortic valve. Aortic valve regurgitation is trivial. Aortic valve sclerosis/calcification is present, without any evidence of aortic stenosis. Aortic valve mean gradient measures 3.0 mmHg. Aortic valve peak gradient measures 5.3 mmHg. Aortic valve area, by VTI measures 2.16 cm. Pulmonic Valve: The pulmonic valve was not well visualized. Pulmonic valve regurgitation is not visualized. No evidence of pulmonic stenosis. Aorta: The aortic root is normal in size and structure. Pulmonary Artery: The pulmonary artery is not well seen. Venous: The inferior vena cava is normal in size with less than 50% respiratory variability, suggesting right atrial pressure of 8 mmHg. IAS/Shunts: The interatrial septum was not well visualized. Agitated saline contrast was given intravenously to evaluate for intracardiac shunting. Bubble study is nondiagnostic for intracardiac shunting.  LEFT VENTRICLE PLAX 2D LVIDd:         4.80 cm LVIDs:         2.90 cm LV PW:         1.30 cm LV IVS:        1.10 cm LVOT diam:     2.00 cm LV SV:         46 LV SV Index:   24 LVOT Area:     3.14 cm  RIGHT VENTRICLE RV Basal diam:  3.20 cm RV Mid diam:    3.00 cm LEFT ATRIUM             Index        RIGHT ATRIUM           Index LA diam:        4.30 cm 2.25 cm/m   RA Area:     15.20 cm LA Vol (A2C):   78.9 ml 41.34 ml/m  RA Volume:   39.60 ml  20.75 ml/m LA Vol (A4C):   48.6 ml 25.46 ml/m LA Biplane Vol: 62.5 ml 32.74 ml/m  AORTIC VALVE AV Area (Vmax):    2.22 cm AV Area (Vmean):   2.06 cm AV Area (VTI):     2.16 cm AV Vmax:           115.00 cm/s AV Vmean:          80.600 cm/s AV VTI:             0.212 m AV Peak Grad:      5.3 mmHg AV Mean Grad:      3.0 mmHg LVOT  Vmax:         81.40 cm/s LVOT Vmean:        52.900 cm/s LVOT VTI:          0.146 m LVOT/AV VTI ratio: 0.69  AORTA Ao Root diam: 3.37 cm MITRAL VALVE MV Area (PHT): 3.40 cm     SHUNTS MV Decel Time: 223 msec     Systemic VTI:  0.15 m MV E velocity: 138.00 cm/s  Systemic Diam: 2.00 cm Yvonne Kendall MD Electronically signed by Yvonne Kendall MD Signature Date/Time: 10/11/2023/11:48:54 AM    Final    MR BRAIN WO CONTRAST  Result Date: 10/10/2023 CLINICAL DATA:  Fall with head strike, on Eliquis EXAM: MRI HEAD WITHOUT CONTRAST TECHNIQUE: Multiplanar, multiecho pulse sequences of the brain and surrounding structures were obtained without intravenous contrast. COMPARISON:  10/09/2023 CT head, no prior MRI available FINDINGS: Brain: No restricted diffusion to suggest acute or subacute infarct. No acute hemorrhage, mass, mass effect, or midline shift. No hydrocephalus or extra-axial collection. Pituitary and craniocervical junction within normal limits. Scattered T2 hyperintense signal in the periventricular white matter, likely the sequela of mild chronic small vessel ischemic disease. Vascular: Normal arterial flow voids. Skull and upper cervical spine: Normal marrow signal. Sinuses/Orbits: Mucosal thickening in the maxillary sinuses, left sphenoid sinus, and left greater than right ethmoid air cells. Postsurgical changes in the right globe. Status post bilateral lens replacements. Other: The mastoid air cells are well aerated. IMPRESSION: No acute intracranial process. No evidence of acute or subacute infarct. Electronically Signed   By: Wiliam Ke M.D.   On: 10/10/2023 00:37   CT Head Wo Contrast  Result Date: 10/09/2023 CLINICAL DATA:  Head trauma, minor (Age >= 65y); Neck trauma (Age >= 65y); Facial trauma, blunt EXAM: CT HEAD WITHOUT CONTRAST CT MAXILLOFACIAL WITHOUT CONTRAST CT CERVICAL SPINE WITHOUT CONTRAST TECHNIQUE:  Multidetector CT imaging of the head, cervical spine, and maxillofacial structures were performed using the standard protocol without intravenous contrast. Multiplanar CT image reconstructions of the cervical spine and maxillofacial structures were also generated. RADIATION DOSE REDUCTION: This exam was performed according to the departmental dose-optimization program which includes automated exposure control, adjustment of the mA and/or kV according to patient size and/or use of iterative reconstruction technique. COMPARISON:  CT head 07/13/2019, x-ray cervical spine 02/01/2011 FINDINGS: CT HEAD FINDINGS Brain: No evidence of large-territorial acute infarction. No parenchymal hemorrhage. No mass lesion. No extra-axial collection. No mass effect or midline shift. No hydrocephalus. Basilar cisterns are patent. Vascular: No hyperdense vessel. Atherosclerotic calcifications are present within the cavernous internal carotid and vertebral arteries. Skull: No acute fracture or focal lesion. Other: None. CT MAXILLOFACIAL FINDINGS Osseous: No fracture or mandibular dislocation. No destructive process. Patient is edentulous. Sinuses/Orbits: Left sphenoid sinus mucosal thickening. Bilateral maxillary sinus mucosal thickening with on almost complete opacification. Maxillary sinus wall hypertrophy. Otherwise paranasal sinuses and mastoid air cells are clear. Bilateral lens replacement. Right scleral buckle. Right orbit calcification-likely phleboliths. Otherwise the orbits are unremarkable. Soft tissues: Negative. CT CERVICAL SPINE FINDINGS Alignment: Normal. Skull base and vertebrae: Multilevel mild are moderate degenerative changes of the spine. C6-C7 posterior disc osteophyte complex formation. No associated severe osseous neural foraminal or central canal stenosis. No acute fracture. No aggressive appearing focal osseous lesion or focal pathologic process. Soft tissues and spinal canal: No prevertebral fluid or swelling. No  visible canal hematoma. Upper chest: Unremarkable. Other: Atherosclerotic plaque of the carotid arteries within the neck. IMPRESSION: 1. No acute intracranial abnormality. 2.  No  acute displaced facial fracture. 3. No acute displaced fracture or traumatic listhesis of the cervical spine. 4. Chronic sinus disease. Electronically Signed   By: Tish Frederickson M.D.   On: 10/09/2023 17:33   CT Cervical Spine Wo Contrast  Result Date: 10/09/2023 CLINICAL DATA:  Head trauma, minor (Age >= 65y); Neck trauma (Age >= 65y); Facial trauma, blunt EXAM: CT HEAD WITHOUT CONTRAST CT MAXILLOFACIAL WITHOUT CONTRAST CT CERVICAL SPINE WITHOUT CONTRAST TECHNIQUE: Multidetector CT imaging of the head, cervical spine, and maxillofacial structures were performed using the standard protocol without intravenous contrast. Multiplanar CT image reconstructions of the cervical spine and maxillofacial structures were also generated. RADIATION DOSE REDUCTION: This exam was performed according to the departmental dose-optimization program which includes automated exposure control, adjustment of the mA and/or kV according to patient size and/or use of iterative reconstruction technique. COMPARISON:  CT head 07/13/2019, x-ray cervical spine 02/01/2011 FINDINGS: CT HEAD FINDINGS Brain: No evidence of large-territorial acute infarction. No parenchymal hemorrhage. No mass lesion. No extra-axial collection. No mass effect or midline shift. No hydrocephalus. Basilar cisterns are patent. Vascular: No hyperdense vessel. Atherosclerotic calcifications are present within the cavernous internal carotid and vertebral arteries. Skull: No acute fracture or focal lesion. Other: None. CT MAXILLOFACIAL FINDINGS Osseous: No fracture or mandibular dislocation. No destructive process. Patient is edentulous. Sinuses/Orbits: Left sphenoid sinus mucosal thickening. Bilateral maxillary sinus mucosal thickening with on almost complete opacification. Maxillary sinus  wall hypertrophy. Otherwise paranasal sinuses and mastoid air cells are clear. Bilateral lens replacement. Right scleral buckle. Right orbit calcification-likely phleboliths. Otherwise the orbits are unremarkable. Soft tissues: Negative. CT CERVICAL SPINE FINDINGS Alignment: Normal. Skull base and vertebrae: Multilevel mild are moderate degenerative changes of the spine. C6-C7 posterior disc osteophyte complex formation. No associated severe osseous neural foraminal or central canal stenosis. No acute fracture. No aggressive appearing focal osseous lesion or focal pathologic process. Soft tissues and spinal canal: No prevertebral fluid or swelling. No visible canal hematoma. Upper chest: Unremarkable. Other: Atherosclerotic plaque of the carotid arteries within the neck. IMPRESSION: 1. No acute intracranial abnormality. 2.  No acute displaced facial fracture. 3. No acute displaced fracture or traumatic listhesis of the cervical spine. 4. Chronic sinus disease. Electronically Signed   By: Tish Frederickson M.D.   On: 10/09/2023 17:33   CT Maxillofacial WO CM  Result Date: 10/09/2023 CLINICAL DATA:  Head trauma, minor (Age >= 65y); Neck trauma (Age >= 65y); Facial trauma, blunt EXAM: CT HEAD WITHOUT CONTRAST CT MAXILLOFACIAL WITHOUT CONTRAST CT CERVICAL SPINE WITHOUT CONTRAST TECHNIQUE: Multidetector CT imaging of the head, cervical spine, and maxillofacial structures were performed using the standard protocol without intravenous contrast. Multiplanar CT image reconstructions of the cervical spine and maxillofacial structures were also generated. RADIATION DOSE REDUCTION: This exam was performed according to the departmental dose-optimization program which includes automated exposure control, adjustment of the mA and/or kV according to patient size and/or use of iterative reconstruction technique. COMPARISON:  CT head 07/13/2019, x-ray cervical spine 02/01/2011 FINDINGS: CT HEAD FINDINGS Brain: No evidence of  large-territorial acute infarction. No parenchymal hemorrhage. No mass lesion. No extra-axial collection. No mass effect or midline shift. No hydrocephalus. Basilar cisterns are patent. Vascular: No hyperdense vessel. Atherosclerotic calcifications are present within the cavernous internal carotid and vertebral arteries. Skull: No acute fracture or focal lesion. Other: None. CT MAXILLOFACIAL FINDINGS Osseous: No fracture or mandibular dislocation. No destructive process. Patient is edentulous. Sinuses/Orbits: Left sphenoid sinus mucosal thickening. Bilateral maxillary sinus mucosal thickening with on almost complete opacification.  Maxillary sinus wall hypertrophy. Otherwise paranasal sinuses and mastoid air cells are clear. Bilateral lens replacement. Right scleral buckle. Right orbit calcification-likely phleboliths. Otherwise the orbits are unremarkable. Soft tissues: Negative. CT CERVICAL SPINE FINDINGS Alignment: Normal. Skull base and vertebrae: Multilevel mild are moderate degenerative changes of the spine. C6-C7 posterior disc osteophyte complex formation. No associated severe osseous neural foraminal or central canal stenosis. No acute fracture. No aggressive appearing focal osseous lesion or focal pathologic process. Soft tissues and spinal canal: No prevertebral fluid or swelling. No visible canal hematoma. Upper chest: Unremarkable. Other: Atherosclerotic plaque of the carotid arteries within the neck. IMPRESSION: 1. No acute intracranial abnormality. 2.  No acute displaced facial fracture. 3. No acute displaced fracture or traumatic listhesis of the cervical spine. 4. Chronic sinus disease. Electronically Signed   By: Tish Frederickson M.D.   On: 10/09/2023 17:33   Medications: I have reviewed the patient's current medications. Scheduled Meds:  brimonidine  1 drop Both Eyes TID   dorzolamide-timolol  1 drop Both Eyes BID   gabapentin  100 mg Oral TID   loratadine  10 mg Oral Daily   mupirocin  ointment  1 Application Nasal BID   pantoprazole (PROTONIX) IV  40 mg Intravenous Q12H   sertraline  100 mg Oral QHS   sodium chloride flush  3 mL Intravenous Q12H   Continuous Infusions: PRN Meds:.bisacodyl, ondansetron **OR** ondansetron (ZOFRAN) IV, polyethylene glycol   Assessment: Principal Problem:   Syncope and collapse Active Problems:   DETACHED RETINA   Essential hypertension   Coronary artery disease involving native coronary artery of native heart without angina pectoris   Type 2 diabetes mellitus with chronic kidney disease (HCC)   B12 deficiency   DVT (deep venous thrombosis) (HCC)   Dark stools   Persistent atrial fibrillation (HCC)   Acute post-hemorrhagic anemia [name] [name] is a 89 y.o. y/o female with a history of DVT, CAD on Eliquis, CKD last dose of Eliquis taken yesterday presents to the hospital after a fall found to have anemia drop in hemoglobin from 9.8 g baseline to 6.7 g baseline.  Seen by Dr. Rhea Belton earlier in the year for iron deficiency anemia.  The patient was recommended EGD colonoscopy but she refused due to the risks associated at her age.  She does mention over the past few days she has had multiple episodes of nosebleeds and she has probably lost at least half a pint of blood each time and nose bled she denies any hematemesis.  Her stools she states have been dark since she was started on oral iron for the anemia.   Impression: Acute on chronic anemia.  Baseline iron deficiency anemia.  She has a history of nosebleeds which could have presented with the acute problem at this point of time.  No clear evidence of a GI bleed at this point of time   Plan 1.  Patient is undecided about any GI evaluation.  She would like to decide after discussing with her family.  ENT was consulted and they said that if no active bleeding no further evaluation required   2.  If she decides inpatient evaluation will plan for Friday 3 days of her Eliquis and will require  EGD plus colonoscopy.  If she decides she does not want it to be done right now she can follow-up with Dr. Rhea Belton as an outpatient and discuss this further if she changes her mind.  Hemoglobin has been stable overnight  LOS: 2 days   [name], MD 10/11/2023, 1:02 PM

## 2023-10-11 NOTE — Plan of Care (Signed)
  Problem: Education: Goal: Knowledge of General Education information will improve Description Including pain rating scale, medication(s)/side effects and non-pharmacologic comfort measures Outcome: Progressing   

## 2023-10-12 DIAGNOSIS — R55 Syncope and collapse: Secondary | ICD-10-CM | POA: Diagnosis not present

## 2023-10-12 LAB — CBC
HCT: 26.2 % — ABNORMAL LOW (ref 36.0–46.0)
Hemoglobin: 8.7 g/dL — ABNORMAL LOW (ref 12.0–15.0)
MCH: 32 pg (ref 26.0–34.0)
MCHC: 33.2 g/dL (ref 30.0–36.0)
MCV: 96.3 fL (ref 80.0–100.0)
Platelets: 201 10*3/uL (ref 150–400)
RBC: 2.72 MIL/uL — ABNORMAL LOW (ref 3.87–5.11)
RDW: 18.1 % — ABNORMAL HIGH (ref 11.5–15.5)
WBC: 3.5 10*3/uL — ABNORMAL LOW (ref 4.0–10.5)
nRBC: 0 % (ref 0.0–0.2)

## 2023-10-12 LAB — GLUCOSE, CAPILLARY: Glucose-Capillary: 134 mg/dL — ABNORMAL HIGH (ref 70–99)

## 2023-10-12 NOTE — Plan of Care (Signed)
  Problem: Education: Goal: Knowledge of condition and prescribed therapy will improve 10/12/2023 1114 by Londell Moh, RN Outcome: Progressing 10/12/2023 1029 by Londell Moh, RN Outcome: Progressing   Problem: Cardiac: Goal: Will achieve and/or maintain adequate cardiac output 10/12/2023 1114 by Londell Moh, RN Outcome: Progressing 10/12/2023 1029 by Londell Moh, RN Outcome: Progressing   Problem: Physical Regulation: Goal: Complications related to the disease process, condition or treatment will be avoided or minimized 10/12/2023 1114 by Londell Moh, RN Outcome: Progressing 10/12/2023 1029 by Londell Moh, RN Outcome: Progressing   Problem: Education: Goal: Knowledge of General Education information will improve Description: Including pain rating scale, medication(s)/side effects and non-pharmacologic comfort measures 10/12/2023 1114 by Londell Moh, RN Outcome: Progressing 10/12/2023 1029 by Londell Moh, RN Outcome: Progressing   Problem: Health Behavior/Discharge Planning: Goal: Ability to manage health-related needs will improve 10/12/2023 1114 by Londell Moh, RN Outcome: Progressing 10/12/2023 1029 by Londell Moh, RN Outcome: Progressing   Problem: Clinical Measurements: Goal: Ability to maintain clinical measurements within normal limits will improve 10/12/2023 1114 by Londell Moh, RN Outcome: Progressing 10/12/2023 1029 by Londell Moh, RN Outcome: Progressing Goal: Will remain free from infection 10/12/2023 1114 by Londell Moh, RN Outcome: Progressing 10/12/2023 1029 by Londell Moh, RN Outcome: Progressing Goal: Diagnostic test results will improve 10/12/2023 1114 by Londell Moh, RN Outcome: Progressing 10/12/2023 1029 by Londell Moh, RN Outcome: Progressing Goal: Respiratory complications will improve 10/12/2023 1114 by Londell Moh, RN Outcome: Progressing 10/12/2023 1029 by Londell Moh, RN Outcome:  Progressing Goal: Cardiovascular complication will be avoided 10/12/2023 1114 by Londell Moh, RN Outcome: Progressing 10/12/2023 1029 by Londell Moh, RN Outcome: Progressing   Problem: Activity: Goal: Risk for activity intolerance will decrease 10/12/2023 1114 by Londell Moh, RN Outcome: Progressing 10/12/2023 1029 by Londell Moh, RN Outcome: Progressing   Problem: Nutrition: Goal: Adequate nutrition will be maintained 10/12/2023 1114 by Londell Moh, RN Outcome: Progressing 10/12/2023 1029 by Londell Moh, RN Outcome: Progressing   Problem: Coping: Goal: Level of anxiety will decrease 10/12/2023 1114 by Londell Moh, RN Outcome: Progressing 10/12/2023 1029 by Londell Moh, RN Outcome: Progressing   Problem: Elimination: Goal: Will not experience complications related to bowel motility 10/12/2023 1114 by Londell Moh, RN Outcome: Progressing 10/12/2023 1029 by Londell Moh, RN Outcome: Progressing Goal: Will not experience complications related to urinary retention 10/12/2023 1114 by Londell Moh, RN Outcome: Progressing 10/12/2023 1029 by Londell Moh, RN Outcome: Progressing   Problem: Pain Management: Goal: General experience of comfort will improve 10/12/2023 1114 by Londell Moh, RN Outcome: Progressing 10/12/2023 1029 by Londell Moh, RN Outcome: Progressing   Problem: Safety: Goal: Ability to remain free from injury will improve 10/12/2023 1114 by Londell Moh, RN Outcome: Progressing 10/12/2023 1029 by Londell Moh, RN Outcome: Progressing   Problem: Skin Integrity: Goal: Risk for impaired skin integrity will decrease 10/12/2023 1114 by Londell Moh, RN Outcome: Progressing 10/12/2023 1029 by Londell Moh, RN Outcome: Progressing

## 2023-10-12 NOTE — Plan of Care (Signed)

## 2023-10-12 NOTE — Care Management Important Message (Signed)
Important Message  Patient Details  Name: [name] [name] MRN: [id number] Date of Birth: February 14, 1932   Important Message Given:  N/A - LOS <3 / Initial given by admissions     Olegario Messier A Jamarius Saha 10/12/2023, 9:48 AM

## 2023-10-12 NOTE — Progress Notes (Addendum)
Progress Note    LYLIANA HELFAND  WUJ:[id number] DOB: [birth year]  DOA: 10/09/2023 PCP: Doreene Nest, NP      Brief Narrative:    Medical records reviewed and are as summarized below:  BRAZIL PECORELLA is a 89 y.o. female with medical history significant for CAD, hyperlipidemia, right lower extremity DVT (venous duplex positive for age-indeterminate DVT in the right soleal veins in January 2024), hypertension, non-Hodgkin's lymphoma, type 2 diabetes mellitus, mitral regurgitation, paroxysmal atrial fibrillation on Eliquis, s/p [place] Dual Blade goniotomy right eye on 09/21/2023 for severe primary open-angle glaucoma right eye.  She presented to the hospital after she passed out at home.  She said that she used the toilet at home.  When she got up from the toilet, she felt dizzy and her vision was black.  She took a few steps forward and she passed out and landed on the floor face forward.  She sustained a laceration to the left lateral eyebrow area.  She woke up surrounded by blood on the floor, from the left eye brow laceration. She said she has been having black stools but she attributes this to iron pills.  She was also prescribed Lasix recently for swelling in her legs.  She only took the first dose on the morning of admission. She had a 2-day history of epistaxis about a week prior to admission.  She had significant bleeding from her left nose which stopped spontaneously.  The following day she had another episode of nosebleed from the left nose but this was mild.  She has not had any epistaxis since then.   She was admitted to the hospital for syncope.  Her hemoglobin dropped from 9.8-6.7   Assessment/Plan:   Principal Problem:   Syncope and collapse Active Problems:   Persistent atrial fibrillation (HCC)   Essential hypertension   Coronary artery disease involving native coronary artery of native heart without angina pectoris   Type 2 diabetes mellitus with chronic kidney  disease (HCC)   B12 deficiency   Dark stools   Acute post-hemorrhagic anemia    Body mass index is 24.51 kg/m.   S/p syncope: Probably due to orthostatic hypotension and anemia 2D echo showed EF estimated at 60 to 65%, mild LVH, LV diastolic function cannot be evaluated, trivial mitral regurgitation, trivial aortic regurgitation   Severe anemia, suspect acute blood loss anemia, iron deficiency anemia: Hemoglobin up from 6.7-9-8.7.  S/p transfusion with 2 units of PRBCs on 10/10/2023.   Dizziness has improved after blood transfusion. Iron 248, TIBC 304, saturation ratio 82 and ferritin 28 Vitamin B12 level was 1,034 on 09/30/2023   Black stool/?melena: Suspect this could be from GI bleeding.  Patient thinks this may be from iron pills because she's had black stools for a while. Continue IV Protonix for now. Earliest date endoscopy can be done is on Friday, 10/14/2023 if patient agrees.  Follow-up with gastroenterologist.   Recent epistaxis from left nose about a week prior to admission: Consulted Dr. Linus Salmons, otolaryngologist.  He said there is nothing for ENT to do at this time since patient is not having epistaxis at this time.   Left eyebrow laceration: This was sutured in the ED by the ED physician on 10/09/2023   Persistent atrial fibrillation: Eliquis still on hold in anticipation of endoscopic workup.. 2D echo in May 2023 showed EF estimated at 60 to 65%, grade 1 diastolic dysfunction, mild concentric LVH   History of right lower extremity  DVT in June 2024: Eliquis on hold   s/p [place] Dual Blade goniotomy right eye on 09/21/2023 for severe primary open-angle glaucoma right eye.  Continue eyedrops   Comorbidities include chronic anemia, CKD stage IIIb, CAD s/p coronary stent, type II DM    Diet Order             Diet heart healthy/carb modified Room service appropriate? Yes; Fluid consistency: Thin  Diet effective now                             Consultants: Gastroenterologist  Procedures: None    Medications:    brimonidine  1 drop Both Eyes TID   dorzolamide-timolol  1 drop Both Eyes BID   gabapentin  100 mg Oral TID   loratadine  10 mg Oral Daily   mupirocin ointment  1 Application Nasal BID   pantoprazole (PROTONIX) IV  40 mg Intravenous Q12H   sertraline  100 mg Oral QHS   sodium chloride flush  3 mL Intravenous Q12H   Continuous Infusions:     Anti-infectives (From admission, onward)    None              Family Communication/Anticipated D/C date and plan/Code Status   DVT prophylaxis:      Code Status: Full Code  Family Communication: None Disposition Plan: Plan to discharge home   Status is: Inpatient Remains inpatient appropriate because: Severe anemia, s/p syncope       Subjective:   She had black stools this morning.  No abdominal pain, vomiting or nosebleed.  Dizziness is better.  Objective:    Vitals:   10/11/23 2336 10/12/23 0323 10/12/23 0818 10/12/23 1136  BP: (!) 120/56 115/67 113/71 117/62  Pulse: 88 81 (!) 104 (!) 102  Resp: 16 16 18 18   Temp: 97.9 F (36.6 C) 97.9 F (36.6 C) 98 F (36.7 C) 98.5 F (36.9 C)  TempSrc:    Oral  SpO2: 98% 99% 95% 96%  Weight:      Height:       No data found.    Intake/Output Summary (Last 24 hours) at 10/12/2023 1607 Last data filed at 10/12/2023 1500 Gross per 24 hour  Intake 1320 ml  Output --  Net 1320 ml   Filed Weights   10/09/23 1623  Weight: 75.3 kg    Exam:  GEN: NAD SKIN: No rash EYES: No pallor or icterus ENT: MMM CV: RRR PULM: CTA B ABD: soft, ND, NT, +BS CNS: AAO x 3, non focal EXT: No edema or tenderness       Data Reviewed:   I have personally reviewed following labs and imaging studies:  Labs: Labs show the following:   Basic Metabolic Panel: Recent Labs  Lab 10/09/23 1634 10/10/23 0939 10/11/23 0503  NA 137 137 139  K 4.7 4.1 4.4  CL 104 106  106  CO2 25 25 25   GLUCOSE 197* 174* 123*  BUN 52* 66* 54*  CREATININE 1.33* 1.33* 1.39*  CALCIUM 8.5* 8.0* 8.3*   GFR Estimated Creatinine Clearance: 28.7 mL/min (A) (by C-G formula based on SCr of 1.39 mg/dL (H)). Liver Function Tests: Recent Labs  Lab 10/09/23 1634 10/10/23 0939  AST 18 13*  ALT 10 8  ALKPHOS 24* 18*  BILITOT 0.6 0.4  PROT 6.4* 5.2*  ALBUMIN 3.6 2.9*   No results for input(s): "LIPASE", "AMYLASE" in the last 168 hours. No results for  input(s): "AMMONIA" in the last 168 hours. Coagulation profile No results for input(s): "INR", "PROTIME" in the last 168 hours.  CBC: Recent Labs  Lab 10/09/23 1634 10/10/23 0939 10/10/23 1830 10/11/23 0503 10/12/23 0331  WBC 6.3 6.0  --  4.9 3.5*  NEUTROABS 4.3  --   --  2.6  --   HGB 9.2* 6.7* 7.6* 9.0* 8.7*  HCT 28.2* 20.3* 23.4* 26.8* 26.2*  MCV 102.5* 102.0*  --  96.1 96.3  PLT 267 210  --  192 201   Cardiac Enzymes: No results for input(s): "CKTOTAL", "CKMB", "CKMBINDEX", "TROPONINI" in the last 168 hours. BNP (last 3 results) No results for input(s): "PROBNP" in the last 8760 hours. CBG: Recent Labs  Lab 10/12/23 0513  GLUCAP 134*   D-Dimer: No results for input(s): "DDIMER" in the last 72 hours. Hgb A1c: No results for input(s): "HGBA1C" in the last 72 hours. Lipid Profile: No results for input(s): "CHOL", "HDL", "LDLCALC", "TRIG", "CHOLHDL", "LDLDIRECT" in the last 72 hours. Thyroid function studies: Recent Labs    10/09/23 1829  TSH 1.483   Anemia work up: Recent Labs    10/10/23 0939  FERRITIN 28  TIBC 304  IRON 248*   Sepsis Labs: Recent Labs  Lab 10/09/23 1634 10/10/23 0939 10/11/23 0503 10/12/23 0331  WBC 6.3 6.0 4.9 3.5*    Microbiology No results found for this or any previous visit (from the past 240 hour(s)).  Procedures and diagnostic studies:  ECHOCARDIOGRAM COMPLETE BUBBLE STUDY  Result Date: 10/11/2023    ECHOCARDIOGRAM REPORT   Patient Name:   [name] [name] [facility]  Date of Exam: 10/11/2023 Medical Rec #:  [id number]     Height:       69.0 in Accession #:    [id number]    Weight:       166.0 lb Date of Birth:  [birth year]      BSA:          1.909 m Patient Age:    89 years      BP:           118/64 mmHg Patient Gender: F             HR:           85 bpm. Exam Location:  [facility] Procedure: 2D Echo, Cardiac Doppler, Saline Contrast Bubble Study and Color            Doppler Indications:     Syncope 780.2 /R55  History:         Patient has prior history of Echocardiogram examinations, most                  recent 04/07/2022. Arrythmias:Atrial Fibrillation. DVT.  Sonographer:     Cristela Blue Referring Phys:  [id number] Eliezer Mccoy PATEL Diagnosing Phys: Yvonne Kendall MD IMPRESSIONS  1. Left ventricular ejection fraction, by estimation, is 60 to 65%. The left ventricle has normal function. The left ventricle has no regional wall motion abnormalities. There is mild left ventricular hypertrophy. Left ventricular diastolic function could not be evaluated.  2. Right ventricular systolic function is normal. The right ventricular size is normal. Mildly increased right ventricular wall thickness. Tricuspid regurgitation signal is inadequate for assessing PA pressure.  3. Left atrial size was borderline dilated.  4. The mitral valve is degenerative. Trivial mitral valve regurgitation. Moderate mitral annular calcification.  5. The aortic valve is tricuspid. There is mild calcification of the aortic valve. There is mild thickening of  the aortic valve. Aortic valve regurgitation is trivial. Aortic valve sclerosis/calcification is present, without any evidence of aortic stenosis.  6. The inferior vena cava is normal in size with <50% respiratory variability, suggesting right atrial pressure of 8 mmHg.  7. Bubble study is nondiagnostic for intracardiac shunting. FINDINGS  Left Ventricle: Left ventricular ejection fraction, by estimation, is 60 to 65%. The left ventricle has normal function. The left ventricle  has no regional wall motion abnormalities. The left ventricular internal cavity size was normal in size. There is  mild left ventricular hypertrophy. Left ventricular diastolic function could not be evaluated due to atrial fibrillation. Left ventricular diastolic function could not be evaluated. Right Ventricle: The right ventricular size is normal. Mildly increased right ventricular wall thickness. Right ventricular systolic function is normal. Tricuspid regurgitation signal is inadequate for assessing PA pressure. Left Atrium: Left atrial size was borderline dilated. Right Atrium: Right atrial size was normal in size. Pericardium: There is no evidence of pericardial effusion. Mitral Valve: The mitral valve is degenerative in appearance. There is mild calcification of the mitral valve leaflet(s). Moderate mitral annular calcification. Trivial mitral valve regurgitation. Tricuspid Valve: The tricuspid valve is grossly normal. Tricuspid valve regurgitation is trivial. Aortic Valve: The aortic valve is tricuspid. There is mild calcification of the aortic valve. There is mild thickening of the aortic valve. Aortic valve regurgitation is trivial. Aortic valve sclerosis/calcification is present, without any evidence of aortic stenosis. Aortic valve mean gradient measures 3.0 mmHg. Aortic valve peak gradient measures 5.3 mmHg. Aortic valve area, by VTI measures 2.16 cm. Pulmonic Valve: The pulmonic valve was not well visualized. Pulmonic valve regurgitation is not visualized. No evidence of pulmonic stenosis. Aorta: The aortic root is normal in size and structure. Pulmonary Artery: The pulmonary artery is not well seen. Venous: The inferior vena cava is normal in size with less than 50% respiratory variability, suggesting right atrial pressure of 8 mmHg. IAS/Shunts: The interatrial septum was not well visualized. Agitated saline contrast was given intravenously to evaluate for intracardiac shunting. Bubble study is  nondiagnostic for intracardiac shunting.  LEFT VENTRICLE PLAX 2D LVIDd:         4.80 cm LVIDs:         2.90 cm LV PW:         1.30 cm LV IVS:        1.10 cm LVOT diam:     2.00 cm LV SV:         46 LV SV Index:   24 LVOT Area:     3.14 cm  RIGHT VENTRICLE RV Basal diam:  3.20 cm RV Mid diam:    3.00 cm LEFT ATRIUM             Index        RIGHT ATRIUM           Index LA diam:        4.30 cm 2.25 cm/m   RA Area:     15.20 cm LA Vol (A2C):   78.9 ml 41.34 ml/m  RA Volume:   39.60 ml  20.75 ml/m LA Vol (A4C):   48.6 ml 25.46 ml/m LA Biplane Vol: 62.5 ml 32.74 ml/m  AORTIC VALVE AV Area (Vmax):    2.22 cm AV Area (Vmean):   2.06 cm AV Area (VTI):     2.16 cm AV Vmax:           115.00 cm/s AV Vmean:  80.600 cm/s AV VTI:            0.212 m AV Peak Grad:      5.3 mmHg AV Mean Grad:      3.0 mmHg LVOT Vmax:         81.40 cm/s LVOT Vmean:        52.900 cm/s LVOT VTI:          0.146 m LVOT/AV VTI ratio: 0.69  AORTA Ao Root diam: 3.37 cm MITRAL VALVE MV Area (PHT): 3.40 cm     SHUNTS MV Decel Time: 223 msec     Systemic VTI:  0.15 m MV E velocity: 138.00 cm/s  Systemic Diam: 2.00 cm Yvonne Kendall MD Electronically signed by Yvonne Kendall MD Signature Date/Time: 10/11/2023/11:48:54 AM    Final                LOS: 3 days   Allana Shrestha  [facility]   Pager on [email or web address]. If 7PM-7AM, please contact night-coverage at [email or web address]     10/12/2023, 4:07 PM

## 2023-10-13 ENCOUNTER — Encounter: Payer: Self-pay | Admitting: Internal Medicine

## 2023-10-13 ENCOUNTER — Ambulatory Visit: Payer: Medicare PPO | Admitting: Family Medicine

## 2023-10-13 DIAGNOSIS — R195 Other fecal abnormalities: Secondary | ICD-10-CM | POA: Diagnosis not present

## 2023-10-13 DIAGNOSIS — Z8669 Personal history of other diseases of the nervous system and sense organs: Secondary | ICD-10-CM | POA: Diagnosis not present

## 2023-10-13 DIAGNOSIS — D509 Iron deficiency anemia, unspecified: Secondary | ICD-10-CM | POA: Diagnosis not present

## 2023-10-13 LAB — BASIC METABOLIC PANEL
Anion gap: 7 (ref 5–15)
BUN: 27 mg/dL — ABNORMAL HIGH (ref 8–23)
CO2: 26 mmol/L (ref 22–32)
Calcium: 8.4 mg/dL — ABNORMAL LOW (ref 8.9–10.3)
Chloride: 107 mmol/L (ref 98–111)
Creatinine, Ser: 1.28 mg/dL — ABNORMAL HIGH (ref 0.44–1.00)
GFR, Estimated: 40 mL/min — ABNORMAL LOW (ref >60)
Glucose, Bld: 140 mg/dL — ABNORMAL HIGH (ref 70–99)
Potassium: 3.7 mmol/L (ref 3.5–5.1)
Sodium: 140 mmol/L (ref 135–145)

## 2023-10-13 LAB — CBC
HCT: 26.3 % — ABNORMAL LOW (ref 36.0–46.0)
Hemoglobin: 8.6 g/dL — ABNORMAL LOW (ref 12.0–15.0)
MCH: 32.2 pg (ref 26.0–34.0)
MCHC: 32.7 g/dL (ref 30.0–36.0)
MCV: 98.5 fL (ref 80.0–100.0)
Platelets: 207 10*3/uL (ref 150–400)
RBC: 2.67 MIL/uL — ABNORMAL LOW (ref 3.87–5.11)
RDW: 17.8 % — ABNORMAL HIGH (ref 11.5–15.5)
WBC: 3 10*3/uL — ABNORMAL LOW (ref 4.0–10.5)
nRBC: 0 % (ref 0.0–0.2)

## 2023-10-13 LAB — GLUCOSE, CAPILLARY: Glucose-Capillary: 186 mg/dL — ABNORMAL HIGH (ref 70–99)

## 2023-10-13 MED ORDER — PEG 3350-KCL-NA BICARB-NACL 420 G PO SOLR
4000.0000 mL | Freq: Once | ORAL | Status: AC
  Administered 2023-10-13: 4000 mL via ORAL
  Filled 2023-10-13: qty 4000

## 2023-10-13 MED ORDER — SODIUM CHLORIDE 0.9 % IV SOLN: INTRAVENOUS | Status: DC

## 2023-10-13 NOTE — Progress Notes (Signed)
[facility]  - [place] [facility]   PATIENT NAME: [name] [name]    MR#:  [id number]  DATE OF BIRTH:  08/10/1932  SUBJECTIVE:  sister-in-law at bedside. Patient feels overall stable. Denies any nosebleed. Continues to have black stools but on iron pills. Denies any abdominal or chest pain. Agreeable for G.I. workup    VITALS:  Blood pressure 120/65, pulse 96, temperature 98.3 F (36.8 C), resp. rate 18, height 5\' 9"  (1.753 m), weight 75.4 kg, SpO2 97%.  PHYSICAL EXAMINATION:   GENERAL:  89 y.o.-year-old patient with no acute distress. Sutured laceration left eye brow LUNGS: Normal breath sounds bilaterally, no wheezing CARDIOVASCULAR: S1, S2 normal. No murmur   ABDOMEN: Soft, nontender, nondistended. Bowel sounds present.  EXTREMITIES: No  edema b/l.    NEUROLOGIC: nonfocal  patient is alert and awake SKIN: No obvious rash, lesion, or ulcer.   LABORATORY PANEL:  CBC Recent Labs  Lab 10/13/23 0435  WBC 3.0*  HGB 8.6*  HCT 26.3*  PLT 207    Chemistries  Recent Labs  Lab 10/10/23 0939 10/11/23 0503 10/13/23 0435  NA 137   < > 140  K 4.1   < > 3.7  CL 106   < > 107  CO2 25   < > 26  GLUCOSE 174*   < > 140*  BUN 66*   < > 27*  CREATININE 1.33*   < > 1.28*  CALCIUM 8.0*   < > 8.4*  AST 13*  --   --   ALT 8  --   --   ALKPHOS 18*  --   --   BILITOT 0.4  --   --    < > = values in this interval not displayed.   Assessment and Plan  [name] [name] is a 89 y.o. female with medical history significant for CAD, hyperlipidemia, right lower extremity DVT (venous duplex positive for age-indeterminate DVT in the right soleal veins in January 2024), hypertension, non-Hodgkin's lymphoma, type 2 diabetes mellitus, mitral regurgitation, paroxysmal atrial fibrillation on Eliquis, s/p [place] Dual Blade goniotomy right eye on 09/21/2023 for severe primary open-angle glaucoma right eye.  She presented to the hospital after she passed out at home.   She had a 2-day  history of epistaxis about a week prior to admission. She had significant bleeding from her left nose which stopped spontaneously.   She was admitted to the hospital for syncope. Her hemoglobin dropped from 9.8-6.7  On iron pills and has black stools.   syncope: Probably due to orthostatic hypotension and anemia --2D echo showed EF estimated at 60 to 65%, mild LVH, LV diastolic function cannot be evaluated, trivial mitral regurgitation, trivial aortic regurgitation --Tele--afib rate controlled    Severe anemia, suspect acute blood loss anemia, iron deficiency anemia,Epistaxis ?GI blood loss -- Hemoglobin up from 9.6--6.7--2 unit BT 9-8.7.   --S/p transfusion with 2 units of PRBCs on 10/10/2023.   --Dizziness has improved after blood transfusion. --Vit b12 WNL   Black stool/?melena: -- Suspect this could be from GI bleeding.   --Patient thinks this may be from iron pills because she's had black stools for a while. --Continue IV Protonix for now. --Earliest date endoscopy can be done is on Friday--pt agreeable. Dr Tobi Bastos informed    Recent epistaxis from left nose about a week prior to admission:  --Consulted Dr. Linus Salmons, otolaryngologist.  -- He said there is nothing for ENT to do at this time since patient is  not having epistaxis at this time.   Left eyebrow laceration: This was sutured in the ED by the ED physician on 10/09/2023    Persistent atrial fibrillation:  --Eliquis still on hold in anticipation of endoscopic workup.Marland Kitchen --2D echo in May 2023 showed EF estimated at 60 to 65%, grade 1 diastolic dysfunction, mild concentric LVH   History of right lower extremity DVT in June 2024: Eliquis on hold   s/p [place] Dual Blade goniotomy right eye on 09/21/2023 for severe primary open-angle glaucoma right eye.  -- Continue eyedrops    CAD s/p stent in the past --stable   type II DM  --on trulicity and glipizide at home --sugars ok--cont SSI  Procedures: Family  communication :SIL Consults : G.I., ENT CODE STATUS: full DVT Prophylaxis : SCD Level of care: Med-Surg Status is: Inpatient Remains inpatient appropriate because: G.I. workup    TOTAL TIME TAKING CARE OF THIS PATIENT: 35 minutes.  >50% time spent on counselling and coordination of care  Note: This dictation was prepared with Dragon dictation along with smaller phrase technology. Any transcriptional errors that result from this process are unintentional.  Enedina Finner M.D    [facility]   CC: Primary care physician; Doreene Nest, NP

## 2023-10-13 NOTE — Progress Notes (Signed)
[name] , MD [address], [place], [place], [address] [address], [address], [place], [place], [address] Phone: [phone]  Fax: [phone]   [name] [name] is being followed for anemia   Subjective: Doing well no complaints no blood in the stool   Objective: Vital signs in last 24 hours: Vitals:   10/12/23 2019 10/13/23 0429 10/13/23 0500 10/13/23 0727  BP: 117/61 122/61  124/69  Pulse: 96 86  96  Resp: 20 20  19   Temp: 98.3 F (36.8 C) 97.9 F (36.6 C)  98.3 F (36.8 C)  TempSrc:    Oral  SpO2: 95% 99%  98%  Weight:   75.4 kg   Height:       Weight change:   Intake/Output Summary (Last 24 hours) at 10/13/2023 1132 Last data filed at 10/13/2023 1047 Gross per 24 hour  Intake 940 ml  Output --  Net 940 ml     Exam: Heart:: Regular rate and rhythm Lungs: normal Abdomen: soft, nontender, normal bowel sounds   Lab Results: @LABTEST2 @ Micro Results: No results found for this or any previous visit (from the past 240 hour(s)). Studies/Results: No results found. Medications: I have reviewed the patient's current medications. Scheduled Meds:  brimonidine  1 drop Both Eyes TID   dorzolamide-timolol  1 drop Both Eyes BID   gabapentin  100 mg Oral TID   loratadine  10 mg Oral Daily   mupirocin ointment  1 Application Nasal BID   pantoprazole (PROTONIX) IV  40 mg Intravenous Q12H   sertraline  100 mg Oral QHS   sodium chloride flush  3 mL Intravenous Q12H   Continuous Infusions: PRN Meds:.acetaminophen, bisacodyl, ondansetron **OR** ondansetron (ZOFRAN) IV, polyethylene glycol   Assessment: Principal Problem:   Syncope and collapse Active Problems:   Essential hypertension   Coronary artery disease involving native coronary artery of native heart without angina pectoris   Type 2 diabetes mellitus with chronic kidney disease (HCC)   B12 deficiency   Dark stools   Persistent atrial fibrillation (HCC)   Acute post-hemorrhagic  anemia  [name] [name] is a 89 y.o. y/o female with a history of DVT, CAD on Eliquis, CKD last dose of Eliquis taken yesterday presents to the hospital after a fall found to have anemia drop in hemoglobin from 9.8 g baseline to 6.7 g baseline.  Seen by Dr. Rhea Belton earlier in the year for iron deficiency anemia.  The patient was recommended EGD colonoscopy but she refused due to the risks associated at her age.  She does mention over the past few days she has had multiple episodes of nosebleeds and she has probably lost at least half a pint of blood each time and nose bled she denies any hematemesis.  Her stools she states have been dark since she was started on oral iron for the anemia.   Impression: Acute on chronic anemia.  Baseline iron deficiency anemia.  She has a history of nosebleeds which could have presented with the acute problem at this point of time.  No clear evidence of a GI bleed at this point of time. Hb stable at 8.6    Plan 1.  EGD+colonoscopy tomorrow to evaluate anemia    I have discussed alternative options, risks & benefits,  which include, but are not limited to, bleeding, infection, perforation,respiratory complication & drug reaction.  The patient agrees with this plan & written consent will be obtained.  LOS: 4 days   [name], MD 10/13/2023, 11:32 AM

## 2023-10-13 NOTE — Care Management Important Message (Signed)
Important Message  Patient Details  Name: [name] [name] MRN: [id number] Date of Birth: 1932-03-02   Important Message Given:  N/A - LOS <3 / Initial given by admissions     Olegario Messier A Lars Jeziorski 10/13/2023, 8:24 AM

## 2023-10-13 NOTE — Plan of Care (Addendum)
Patient is alert and oriented X 4. P. I/v in her left anterior forearm. NULYTELY solution started .  Plan for EGD + COLONOSCOPY tomorrow.consent taken.Denies any pain.  Problem: Education: Goal: Knowledge of condition and prescribed therapy will improve Outcome: Progressing   Problem: Cardiac: Goal: Will achieve and/or maintain adequate cardiac output Outcome: Progressing   Problem: Physical Regulation: Goal: Complications related to the disease process, condition or treatment will be avoided or minimized Outcome: Progressing   Problem: Education: Goal: Knowledge of General Education information will improve Description: Including pain rating scale, medication(s)/side effects and non-pharmacologic comfort measures Outcome: Progressing   Problem: Health Behavior/Discharge Planning: Goal: Ability to manage health-related needs will improve Outcome: Progressing   Problem: Clinical Measurements: Goal: Ability to maintain clinical measurements within normal limits will improve Outcome: Progressing Goal: Will remain free from infection Outcome: Progressing Goal: Diagnostic test results will improve Outcome: Progressing Goal: Respiratory complications will improve Outcome: Progressing Goal: Cardiovascular complication will be avoided Outcome: Progressing   Problem: Activity: Goal: Risk for activity intolerance will decrease Outcome: Progressing   Problem: Nutrition: Goal: Adequate nutrition will be maintained Outcome: Progressing   Problem: Coping: Goal: Level of anxiety will decrease Outcome: Progressing   Problem: Elimination: Goal: Will not experience complications related to bowel motility Outcome: Progressing Goal: Will not experience complications related to urinary retention Outcome: Progressing   Problem: Pain Management: Goal: General experience of comfort will improve Outcome: Progressing   Problem: Safety: Goal: Ability to remain free from injury will  improve Outcome: Progressing   Problem: Skin Integrity: Goal: Risk for impaired skin integrity will decrease Outcome: Progressing

## 2023-10-14 ENCOUNTER — Other Ambulatory Visit: Payer: Medicare PPO

## 2023-10-14 DIAGNOSIS — I2510 Atherosclerotic heart disease of native coronary artery without angina pectoris: Secondary | ICD-10-CM

## 2023-10-14 DIAGNOSIS — R55 Syncope and collapse: Secondary | ICD-10-CM | POA: Diagnosis not present

## 2023-10-14 DIAGNOSIS — I4819 Other persistent atrial fibrillation: Secondary | ICD-10-CM | POA: Diagnosis not present

## 2023-10-14 DIAGNOSIS — D62 Acute posthemorrhagic anemia: Secondary | ICD-10-CM | POA: Diagnosis not present

## 2023-10-14 LAB — GLUCOSE, CAPILLARY: Glucose-Capillary: 129 mg/dL — ABNORMAL HIGH (ref 70–99)

## 2023-10-14 MED ORDER — DORZOLAMIDE HCL-TIMOLOL MAL 2-0.5 % OP SOLN
1.0000 [drp] | Freq: Two times a day (BID) | OPHTHALMIC | Status: DC
  Administered 2023-10-14 (×2): 1 [drp] via OPHTHALMIC
  Filled 2023-10-14: qty 10

## 2023-10-14 MED ORDER — DORZOLAMIDE HCL-TIMOLOL MAL 2-0.5 % OP SOLN
1.0000 [drp] | Freq: Two times a day (BID) | OPHTHALMIC | Status: DC
  Filled 2023-10-14: qty 10

## 2023-10-14 MED ORDER — PEG 3350-KCL-NA BICARB-NACL 420 G PO SOLR
4000.0000 mL | Freq: Once | ORAL | Status: AC
  Administered 2023-10-14: 4000 mL via ORAL
  Filled 2023-10-14: qty 4000

## 2023-10-14 MED ORDER — BRIMONIDINE TARTRATE 0.2 % OP SOLN
1.0000 [drp] | Freq: Three times a day (TID) | OPHTHALMIC | Status: DC
  Filled 2023-10-14: qty 5

## 2023-10-14 MED ORDER — BRIMONIDINE TARTRATE 0.2 % OP SOLN
1.0000 [drp] | Freq: Three times a day (TID) | OPHTHALMIC | Status: DC
  Administered 2023-10-14 (×3): 1 [drp] via OPHTHALMIC
  Filled 2023-10-14: qty 5

## 2023-10-14 NOTE — TOC CM/SW Note (Signed)
Transition of Care [facility]) - Inpatient Brief Assessment   Patient Details  Name: SHERON KUNIN MRN: [id number] Date of Birth: Jun 27, 1932  Transition of Care [facility]) CM/SW Contact:    Allena Katz, LCSW Phone Number: 10/14/2023, 1:35 PM   Clinical Narrative:    Transition of Care Asessment: Insurance and Status: Insurance coverage has been reviewed Patient has primary care physician: Yes Home environment has been reviewed: [address] [facility] [id number]- Prior level of function:: MD reports no PT/OT needs Prior/Current Home Services: No current home services Social Determinants of Health Reivew: SDOH reviewed no interventions necessary Readmission risk has been reviewed: Yes Transition of care needs: no transition of care needs at this time

## 2023-10-14 NOTE — Progress Notes (Signed)
[facility]  - [place] [facility]   PATIENT NAME: [name] [name]    MR#:  [id number]  DATE OF BIRTH:  November 01, 1932  SUBJECTIVE:  no family at bedside. Patient feels overall stable. Denies any nosebleed.  Patient was unable to adequately complete colonoscopy bowel prep.  VITALS:  Blood pressure 122/64, pulse 60, temperature 98.2 F (36.8 C), temperature source Oral, resp. rate 19, height 5\' 9"  (1.753 m), weight 75.4 kg, SpO2 90%.  PHYSICAL EXAMINATION:   GENERAL:  89 y.o.-year-old patient with no acute distress. Sutured laceration left eye brow LUNGS: Normal breath sounds bilaterally, no wheezing CARDIOVASCULAR: S1, S2 normal. No murmur   ABDOMEN: Soft, nontender, nondistended. Bowel sounds present.  EXTREMITIES: No  edema b/l.    NEUROLOGIC: nonfocal  patient is alert and awake SKIN: No obvious rash, lesion, or ulcer.   LABORATORY PANEL:  CBC Recent Labs  Lab 10/13/23 0435  WBC 3.0*  HGB 8.6*  HCT 26.3*  PLT 207    Chemistries  Recent Labs  Lab 10/10/23 0939 10/11/23 0503 10/13/23 0435  NA 137   < > 140  K 4.1   < > 3.7  CL 106   < > 107  CO2 25   < > 26  GLUCOSE 174*   < > 140*  BUN 66*   < > 27*  CREATININE 1.33*   < > 1.28*  CALCIUM 8.0*   < > 8.4*  AST 13*  --   --   ALT 8  --   --   ALKPHOS 18*  --   --   BILITOT 0.4  --   --    < > = values in this interval not displayed.   Assessment and Plan  [name] [name] is a 89 y.o. female with medical history significant for CAD, hyperlipidemia, right lower extremity DVT (venous duplex positive for age-indeterminate DVT in the right soleal veins in January 2024), hypertension, non-Hodgkin's lymphoma, type 2 diabetes mellitus, mitral regurgitation, paroxysmal atrial fibrillation on Eliquis, s/p [place] Dual Blade goniotomy right eye on 09/21/2023 for severe primary open-angle glaucoma right eye.  She presented to the hospital after she passed out at home.   She had a 2-day history of epistaxis about  a week prior to admission. She had significant bleeding from her left nose which stopped spontaneously.   She was admitted to the hospital for syncope. Her hemoglobin dropped from 9.8-6.7  On iron pills and has black stools.   syncope: Probably due to orthostatic hypotension and anemia --2D echo showed EF estimated at 60 to 65%, mild LVH, LV diastolic function cannot be evaluated, trivial mitral regurgitation, trivial aortic regurgitation --Tele--afib rate controlled    Severe anemia, suspect acute blood loss anemia, iron deficiency anemia,Epistaxis ?GI blood loss -- Hemoglobin up from 9.6--6.7--2 unit BT 9-8.7.   --S/p transfusion with 2 units of PRBCs on 10/10/2023.   --Dizziness has improved after blood transfusion. --Vit b12 WNL -- colonoscopy will be done tomorrow due to inadequate bowel prep   Black stool/?melena: -- Suspect this could be from GI bleeding.   --Patient thinks this may be from iron pills because she's had black stools for a while. --Continue IV Protonix for now. --Earliest date endoscopy can be done is on Friday--pt agreeable. Dr Tobi Bastos informed    Recent epistaxis from left nose about a week prior to admission:  --Consulted Dr. Linus Salmons, otolaryngologist.  -- He said there is nothing for ENT to do at this  time since patient is not having epistaxis at this time.   Left eyebrow laceration: This was sutured in the ED by the ED physician on 10/09/2023    Persistent atrial fibrillation:  --Eliquis still on hold in anticipation of endoscopic workup.Marland Kitchen --2D echo in May 2023 showed EF estimated at 60 to 65%, grade 1 diastolic dysfunction, mild concentric LVH   History of right lower extremity DVT in June 2024: Eliquis on hold   s/p [place] Dual Blade goniotomy right eye on 09/21/2023 for severe primary open-angle glaucoma right eye.  -- Continue eyedrops    CAD s/p stent in the past --stable   type II DM  --on trulicity and glipizide at home --sugars  ok--cont SSI  Patient states she is fairly independent and does not feel she needs PT OT at present  Procedures: Family communication :SIL Consults : G.I., ENT CODE STATUS: full DVT Prophylaxis : SCD Level of care: Med-Surg Status is: Inpatient Remains inpatient appropriate because: G.I. workup    TOTAL TIME TAKING CARE OF THIS PATIENT: 35 minutes.  >50% time spent on counselling and coordination of care  Note: This dictation was prepared with Dragon dictation along with smaller phrase technology. Any transcriptional errors that result from this process are unintentional.  Enedina Finner M.D    [facility]   CC: Primary care physician; Doreene Nest, NP

## 2023-10-14 NOTE — Progress Notes (Signed)
Did not complete bowel prep   Rescheduled for EGD+colonoscopy for tomorrow    Dr [name] MD,MRCP [facility]) Gastroenterology/Hepatology Pager: [phone]

## 2023-10-14 NOTE — Plan of Care (Signed)

## 2023-10-14 NOTE — H&P (View-Only) (Signed)
Did not complete bowel prep   Rescheduled for EGD+colonoscopy for tomorrow    Dr [name] MD,MRCP [facility]) Gastroenterology/Hepatology Pager: [phone]

## 2023-10-15 ENCOUNTER — Inpatient Hospital Stay: Payer: Medicare PPO | Admitting: Certified Registered"

## 2023-10-15 ENCOUNTER — Encounter: Payer: Self-pay | Admitting: Internal Medicine

## 2023-10-15 ENCOUNTER — Encounter
Admission: EM | Disposition: A | Discharge status: Home / Self Care | Payer: Self-pay | Source: Home / Self Care | Attending: Internal Medicine

## 2023-10-15 DIAGNOSIS — R55 Syncope and collapse: Secondary | ICD-10-CM | POA: Diagnosis not present

## 2023-10-15 HISTORY — PX: COLONOSCOPY WITH PROPOFOL: SHX5780

## 2023-10-15 HISTORY — PX: HEMOSTASIS CLIP PLACEMENT: SHX6857

## 2023-10-15 HISTORY — PX: BIOPSY: SHX5522

## 2023-10-15 HISTORY — PX: POLYPECTOMY: SHX5525

## 2023-10-15 HISTORY — PX: ESOPHAGOGASTRODUODENOSCOPY (EGD) WITH PROPOFOL: SHX5813

## 2023-10-15 LAB — GLUCOSE, CAPILLARY: Glucose-Capillary: 112 mg/dL — ABNORMAL HIGH (ref 70–99)

## 2023-10-15 SURGERY — ESOPHAGOGASTRODUODENOSCOPY (EGD) WITH PROPOFOL
Anesthesia: General

## 2023-10-15 MED ORDER — LIDOCAINE HCL (PF) 2 % IJ SOLN
INTRAMUSCULAR | Status: AC
  Filled 2023-10-15: qty 5

## 2023-10-15 MED ORDER — SODIUM CHLORIDE 0.9 % IV SOLN: INTRAVENOUS | Status: DC | PRN

## 2023-10-15 MED ORDER — PROPOFOL 10 MG/ML IV BOLUS
INTRAVENOUS | Status: DC | PRN
  Administered 2023-10-15: 50 mg via INTRAVENOUS
  Administered 2023-10-15: 20 mg via INTRAVENOUS

## 2023-10-15 MED ORDER — LIDOCAINE 2% (20 MG/ML) 5 ML SYRINGE
INTRAMUSCULAR | Status: DC | PRN
  Administered 2023-10-15: 20 mg via INTRAVENOUS

## 2023-10-15 MED ORDER — PROPOFOL 1000 MG/100ML IV EMUL
INTRAVENOUS | Status: AC
  Filled 2023-10-15: qty 100

## 2023-10-15 MED ORDER — PROPOFOL 500 MG/50ML IV EMUL
INTRAVENOUS | Status: DC | PRN
  Administered 2023-10-15: 120 ug/kg/min via INTRAVENOUS

## 2023-10-15 NOTE — Transfer of Care (Signed)
Immediate Anesthesia Transfer of Care Note  Patient: Sammuel Cooper  Procedure(s) Performed: ESOPHAGOGASTRODUODENOSCOPY (EGD) WITH PROPOFOL COLONOSCOPY WITH PROPOFOL BIOPSY POLYPECTOMY  Patient Location: PACU  Anesthesia Type:General  Level of Consciousness: awake, alert , and oriented  Airway & Oxygen Therapy: Patient Spontanous Breathing  Post-op Assessment: Report given to RN and Post -op Vital signs reviewed and stable  Post vital signs: Reviewed  Last Vitals:  Vitals Value Taken Time  BP 115/60   Temp    Pulse 81 10/15/23 0948  Resp 16 10/15/23 0948  SpO2 97 % 10/15/23 0948  Vitals shown include unfiled device data.  Last Pain:  Vitals:   10/15/23 0838  TempSrc:   PainSc: 0-No pain      Patients Stated Pain Goal: 2 (10/13/23 0727)  Complications: No notable events documented.

## 2023-10-15 NOTE — Discharge Summary (Signed)
Physician Discharge Summary   Patient: [name] [name] MRN: [id number] DOB: Apr 17, 1932  Admit date:     10/09/2023  Discharge date: 10/15/23  Discharge Physician: Enedina Finner   PCP: Doreene Nest, NP   Recommendations at discharge:    F/u PCP in 1-2 weeks Resume Eliquis from Monday nov 18th,  2024  Discharge Diagnoses: Principal Problem:   Syncope and collapse Active Problems:   Persistent atrial fibrillation (HCC)   Essential hypertension   Coronary artery disease involving native coronary artery of native heart without angina pectoris   Type 2 diabetes mellitus with chronic kidney disease (HCC)   B12 deficiency   Dark stools   Acute post-hemorrhagic anemia [name] [name] is a 89 y.o. female with medical history significant for CAD, hyperlipidemia, right lower extremity DVT (venous duplex positive for age-indeterminate DVT in the right soleal veins in January 2024), hypertension, non-Hodgkin's lymphoma, type 2 diabetes mellitus, mitral regurgitation, paroxysmal atrial fibrillation on Eliquis, s/p [place] Dual Blade goniotomy right eye on 09/21/2023 for severe primary open-angle glaucoma right eye.  She presented to the hospital after she passed out at home.    She had a 2-day history of epistaxis about a week prior to admission. She had significant bleeding from her left nose which stopped spontaneously.    She was admitted to the hospital for syncope. Her hemoglobin dropped from 9.8-6.7  On iron pills and has black stools.    Syncope: Probably due to orthostatic hypotension and anemia --2D echo showed EF estimated at 60 to 65%, mild LVH, LV diastolic function cannot be evaluated, trivial mitral regurgitation, trivial aortic regurgitation --Tele--afib rate controlled -BP stable. Patient ambulating without difficulty    Severe anemia, suspect acute blood loss anemia, iron deficiency anemia,Epistaxis ?GI blood loss -- Hemoglobin up from 9.6--6.7--2 unit BT 9-8.7.   --S/p  transfusion with 2 units of PRBCs on 10/10/2023.   --Dizziness has improved after blood transfusion. --Vit b12 WNL -- EGD- Normal esophagus.                        - Multiple gastric polyps. Biopsied.                        - Rule out malignancy, gastric tumor on the lesser                         curvature of the stomach. Biopsied.                        - Normal examined duodenum.                        - Await pathology results. -- colonoscopy- Non-bleeding internal hemorrhoids.                        - Mild diverticulosis in the left colon. There was no                         evidence of diverticular bleeding.                        - One 19 mm polyp in the proximal ascending colon,  removed with a hot snare. Resected and retrieved. Clip                         (MR conditional) was placed. Clip manufacturer: [place]  -- per Dr. Norma Fredrickson okay to resume eliquis after 24 hours   Recent epistaxis from left nose about a week prior to admission:  --Consulted Dr. Linus Salmons, otolaryngologist.  -- He said there is nothing for ENT to do at this time since patient is not having epistaxis at this time. -- Stable   Left eyebrow laceration: This was sutured in the ED by the ED physician on 10/09/2023 -- will try to remove stitches prior to discharge    Persistent atrial fibrillation:  --Eliquis still on hold in anticipation of endoscopic workup.Marland Kitchen --2D echo in May 2023 showed EF estimated at 60 to 65%, grade 1 diastolic dysfunction, mild concentric LVH  -- eliquis to be resume from Monday, November 18 per G.I.  History of right lower extremity DVT in June 2024: Eliquis will be resumed after discharge   s/p [place] Dual Blade goniotomy right eye on 09/21/2023 for severe primary open-angle glaucoma right eye.  -- Continue eyedrops    CAD s/p stent in the past --stable    type II DM  --on trulicity and glipizide at home --sugars ok--cont SSI -- resume home meds at  discharge   Patient states she is fairly independent and does not feel she needs PT OT at present  Discharge plan was discussed with patient's friend and niece in the room. Patient agreeable to go home later today. Patient to follow-up gastric biopsy results with PCP/G.I. Niece informed   Procedures: EGD, colonoscopy Family communication : family in the room Consults : G.I., ENT CODE STATUS: full DVT Prophylaxis : SCD       Disposition: Home Diet recommendation:  Discharge Diet Orders (From admission, onward)     Start     Ordered   10/15/23 0000  Diet - low sodium heart healthy        10/15/23 1156           Cardiac and Carb modified diet DISCHARGE MEDICATION: Allergies as of 10/15/2023       Reactions   Other Rash, Anaphylaxis   Shellfish Allergy Anaphylaxis, Rash   Cephalexin    REACTION: trash and swelling   Ciprofloxacin    REACTION: u/k   Clopidogrel Bisulfate    REACTION: swelling, rash   Ezetimibe-simvastatin    REACTION: myalgias   Lidocaine Hives   ONLY TO ADHESIVE LIDOCAINE PATCHES. NO PROBLEM WITH INJECTABLE LIDOCAINE.   Nitrofurantoin    REACTION: [place]   Pregabalin    REACTION: felt bad   Celecoxib Rash   REACTION: [place]   Codeine Rash   Codeine Phosphate Rash   Hydrocod Poli-chlorphe Poli Er Rash   Propoxyphene Rash   Propoxyphene N-acetaminophen Rash   Rofecoxib Rash   Sulfa Antibiotics Rash   Sulfamethoxazole Rash   Sulfonamide Derivatives Rash        Medication List     STOP taking these medications    mirabegron ER 50 MG Tb24 tablet Commonly known as: Myrbetriq       TAKE these medications    Blood Glucose Monitoring Suppl Devi 1 each by Does not apply route in the morning, at noon, and at bedtime. May substitute to any manufacturer covered by patient's insurance.   BLOOD GLUCOSE TEST STRIPS Strp 1 each by In Vitro  route in the morning, at noon, and at bedtime. May substitute to any manufacturer covered by patient's  insurance.   brimonidine 0.2 % ophthalmic solution Commonly known as: ALPHAGAN Place 1 drop into the right eye in the morning and at bedtime.   CRANBERRY PO Take 1 tablet by mouth daily.   cyanocobalamin 1000 MCG tablet Commonly known as: VITAMIN B12 Take 1,000 mcg by mouth daily.   diclofenac Sodium 1 % Gel Commonly known as: VOLTAREN APPLY 2 GRAMS TOPICALLY THREE TIMES A DAY AS NEEDED   dorzolamide-timolol 2-0.5 % ophthalmic solution Commonly known as: COSOPT Place 1 drop into the right eye 2 (two) times daily.   Eliquis 5 MG Tabs tablet Generic drug: apixaban TAKE 1 TABLET TWICE DAILY Notes to patient: Start from [facility] NOV [id number]   ferrous sulfate 325 (65 FE) MG tablet Take 1 tablet (325 mg total) by mouth daily with breakfast.   fexofenadine 180 MG tablet Commonly known as: ALLEGRA Take 1 tablet (180 mg total) by mouth as needed for allergies or rhinitis.   furosemide 20 MG tablet Commonly known as: Lasix Take 2 tablets by mouth every morning for 3-5 days for fluid build up.   gabapentin 100 MG capsule Commonly known as: NEURONTIN Take 1 capsule (100 mg total) by mouth 3 (three) times daily.   glipiZIDE 10 MG 24 hr tablet Commonly known as: GLUCOTROL XL Take 1 tablet (10 mg total) by mouth daily with breakfast. for diabetes.   isosorbide mononitrate 60 MG 24 hr tablet Commonly known as: IMDUR Take 1.5 tablets (90 mg total) by mouth daily.   Lancets Misc. Misc 1 each by Does not apply route in the morning, at noon, and at bedtime. May substitute to any manufacturer covered by patient's insurance.   metoprolol succinate 100 MG 24 hr tablet Commonly known as: TOPROL-XL Take one (1) tablet by mouth (100 mg) daily. Take with or immediately following a meal.   Nexlizet 180-10 MG Tabs Generic drug: Bempedoic Acid-Ezetimibe Take 1 tablet by mouth daily.   nitroGLYCERIN 0.4 MG SL tablet Commonly known as: NITROSTAT Place 1 tablet (0.4 mg total) under the  tongue every 5 (five) minutes as needed for chest pain.   pantoprazole 20 MG tablet Commonly known as: PROTONIX TAKE 1 TABLET BY MOUTH DAILY FOR HEARTBURN   Rocklatan 0.02-0.005 % Soln Generic drug: Netarsudil-Latanoprost Place 1 drop into the right eye at bedtime.   sertraline 100 MG tablet Commonly known as: ZOLOFT TAKE 1 TABLET BY MOUTH DAILY FOR ANXIETY AND DEPRESSION   triamcinolone cream 0.1 % Commonly known as: KENALOG Apply 1 Application topically 2 (two) times daily. What changed:  when to take this reasons to take this   Trulicity 0.75 MG/0.5ML Soaj Generic drug: Dulaglutide Inject 0.75 mg into the skin once a week. for diabetes.   Vitamin D-3 25 MCG (1000 UT) Caps Take 1 capsule by mouth daily.        Follow-up Information     Doreene Nest, NP In 1 week.   Specialty: Internal Medicine Why: For suture removal Hospital f/u Contact information: [address] [place] [address] [phone]                Discharge Exam: Filed Weights   10/09/23 1623 10/13/23 0500 10/15/23 0500  Weight: 75.3 kg 75.4 kg 75 kg   GENERAL:  89 y.o.-year-old patient with no acute distress. Sutured laceration left eye brow LUNGS: Normal breath sounds bilaterally, no wheezing CARDIOVASCULAR: S1,  S2 normal. No murmur   ABDOMEN: Soft, nontender, nondistended. Bowel sounds present.  EXTREMITIES: No  edema b/l.    NEUROLOGIC: nonfocal  patient is alert and awake  Condition at discharge: fair  The results of significant diagnostics from this hospitalization (including imaging, microbiology, ancillary and laboratory) are listed below for reference.   Imaging Studies: ECHOCARDIOGRAM COMPLETE BUBBLE STUDY  Result Date: 10/11/2023    ECHOCARDIOGRAM REPORT   Patient Name:   [name] [name] [facility] Date of Exam: 10/11/2023 Medical Rec #:  [id number]     Height:       69.0 in Accession #:    [id number]    Weight:       166.0 lb Date of Birth:  Apr 03, 1932      BSA:           1.909 m Patient Age:    89 years      BP:           118/64 mmHg Patient Gender: F             HR:           85 bpm. Exam Location:  [facility] Procedure: 2D Echo, Cardiac Doppler, Saline Contrast Bubble Study and Color            Doppler Indications:     Syncope 780.2 /R55  History:         Patient has prior history of Echocardiogram examinations, most                  recent 04/07/2022. Arrythmias:Atrial Fibrillation. DVT.  Sonographer:     Cristela Blue Referring Phys:  [id number] Eliezer Mccoy Aariv Medlock Diagnosing Phys: Yvonne Kendall MD IMPRESSIONS  1. Left ventricular ejection fraction, by estimation, is 60 to 65%. The left ventricle has normal function. The left ventricle has no regional wall motion abnormalities. There is mild left ventricular hypertrophy. Left ventricular diastolic function could not be evaluated.  2. Right ventricular systolic function is normal. The right ventricular size is normal. Mildly increased right ventricular wall thickness. Tricuspid regurgitation signal is inadequate for assessing PA pressure.  3. Left atrial size was borderline dilated.  4. The mitral valve is degenerative. Trivial mitral valve regurgitation. Moderate mitral annular calcification.  5. The aortic valve is tricuspid. There is mild calcification of the aortic valve. There is mild thickening of the aortic valve. Aortic valve regurgitation is trivial. Aortic valve sclerosis/calcification is present, without any evidence of aortic stenosis.  6. The inferior vena cava is normal in size with <50% respiratory variability, suggesting right atrial pressure of 8 mmHg.  7. Bubble study is nondiagnostic for intracardiac shunting. FINDINGS  Left Ventricle: Left ventricular ejection fraction, by estimation, is 60 to 65%. The left ventricle has normal function. The left ventricle has no regional wall motion abnormalities. The left ventricular internal cavity size was normal in size. There is  mild left ventricular hypertrophy. Left ventricular  diastolic function could not be evaluated due to atrial fibrillation. Left ventricular diastolic function could not be evaluated. Right Ventricle: The right ventricular size is normal. Mildly increased right ventricular wall thickness. Right ventricular systolic function is normal. Tricuspid regurgitation signal is inadequate for assessing PA pressure. Left Atrium: Left atrial size was borderline dilated. Right Atrium: Right atrial size was normal in size. Pericardium: There is no evidence of pericardial effusion. Mitral Valve: The mitral valve is degenerative in appearance. There is mild calcification of the mitral valve leaflet(s). Moderate mitral annular calcification. Trivial mitral  valve regurgitation. Tricuspid Valve: The tricuspid valve is grossly normal. Tricuspid valve regurgitation is trivial. Aortic Valve: The aortic valve is tricuspid. There is mild calcification of the aortic valve. There is mild thickening of the aortic valve. Aortic valve regurgitation is trivial. Aortic valve sclerosis/calcification is present, without any evidence of aortic stenosis. Aortic valve mean gradient measures 3.0 mmHg. Aortic valve peak gradient measures 5.3 mmHg. Aortic valve area, by VTI measures 2.16 cm. Pulmonic Valve: The pulmonic valve was not well visualized. Pulmonic valve regurgitation is not visualized. No evidence of pulmonic stenosis. Aorta: The aortic root is normal in size and structure. Pulmonary Artery: The pulmonary artery is not well seen. Venous: The inferior vena cava is normal in size with less than 50% respiratory variability, suggesting right atrial pressure of 8 mmHg. IAS/Shunts: The interatrial septum was not well visualized. Agitated saline contrast was given intravenously to evaluate for intracardiac shunting. Bubble study is nondiagnostic for intracardiac shunting.  LEFT VENTRICLE PLAX 2D LVIDd:         4.80 cm LVIDs:         2.90 cm LV PW:         1.30 cm LV IVS:        1.10 cm LVOT diam:      2.00 cm LV SV:         46 LV SV Index:   24 LVOT Area:     3.14 cm  RIGHT VENTRICLE RV Basal diam:  3.20 cm RV Mid diam:    3.00 cm LEFT ATRIUM             Index        RIGHT ATRIUM           Index LA diam:        4.30 cm 2.25 cm/m   RA Area:     15.20 cm LA Vol (A2C):   78.9 ml 41.34 ml/m  RA Volume:   39.60 ml  20.75 ml/m LA Vol (A4C):   48.6 ml 25.46 ml/m LA Biplane Vol: 62.5 ml 32.74 ml/m  AORTIC VALVE AV Area (Vmax):    2.22 cm AV Area (Vmean):   2.06 cm AV Area (VTI):     2.16 cm AV Vmax:           115.00 cm/s AV Vmean:          80.600 cm/s AV VTI:            0.212 m AV Peak Grad:      5.3 mmHg AV Mean Grad:      3.0 mmHg LVOT Vmax:         81.40 cm/s LVOT Vmean:        52.900 cm/s LVOT VTI:          0.146 m LVOT/AV VTI ratio: 0.69  AORTA Ao Root diam: 3.37 cm MITRAL VALVE MV Area (PHT): 3.40 cm     SHUNTS MV Decel Time: 223 msec     Systemic VTI:  0.15 m MV E velocity: 138.00 cm/s  Systemic Diam: 2.00 cm Yvonne Kendall MD Electronically signed by Yvonne Kendall MD Signature Date/Time: 10/11/2023/11:48:54 AM    Final    MR BRAIN WO CONTRAST  Result Date: 10/10/2023 CLINICAL DATA:  Fall with head strike, on Eliquis EXAM: MRI HEAD WITHOUT CONTRAST TECHNIQUE: Multiplanar, multiecho pulse sequences of the brain and surrounding structures were obtained without intravenous contrast. COMPARISON:  10/09/2023 CT head, no prior MRI available FINDINGS: Brain: No restricted  diffusion to suggest acute or subacute infarct. No acute hemorrhage, mass, mass effect, or midline shift. No hydrocephalus or extra-axial collection. Pituitary and craniocervical junction within normal limits. Scattered T2 hyperintense signal in the periventricular white matter, likely the sequela of mild chronic small vessel ischemic disease. Vascular: Normal arterial flow voids. Skull and upper cervical spine: Normal marrow signal. Sinuses/Orbits: Mucosal thickening in the maxillary sinuses, left sphenoid sinus, and left greater  than right ethmoid air cells. Postsurgical changes in the right globe. Status post bilateral lens replacements. Other: The mastoid air cells are well aerated. IMPRESSION: No acute intracranial process. No evidence of acute or subacute infarct. Electronically Signed   By: Wiliam Ke M.D.   On: 10/10/2023 00:37   CT Head Wo Contrast  Result Date: 10/09/2023 CLINICAL DATA:  Head trauma, minor (Age >= 65y); Neck trauma (Age >= 65y); Facial trauma, blunt EXAM: CT HEAD WITHOUT CONTRAST CT MAXILLOFACIAL WITHOUT CONTRAST CT CERVICAL SPINE WITHOUT CONTRAST TECHNIQUE: Multidetector CT imaging of the head, cervical spine, and maxillofacial structures were performed using the standard protocol without intravenous contrast. Multiplanar CT image reconstructions of the cervical spine and maxillofacial structures were also generated. RADIATION DOSE REDUCTION: This exam was performed according to the departmental dose-optimization program which includes automated exposure control, adjustment of the mA and/or kV according to patient size and/or use of iterative reconstruction technique. COMPARISON:  CT head 07/13/2019, x-ray cervical spine 02/01/2011 FINDINGS: CT HEAD FINDINGS Brain: No evidence of large-territorial acute infarction. No parenchymal hemorrhage. No mass lesion. No extra-axial collection. No mass effect or midline shift. No hydrocephalus. Basilar cisterns are patent. Vascular: No hyperdense vessel. Atherosclerotic calcifications are present within the cavernous internal carotid and vertebral arteries. Skull: No acute fracture or focal lesion. Other: None. CT MAXILLOFACIAL FINDINGS Osseous: No fracture or mandibular dislocation. No destructive process. Patient is edentulous. Sinuses/Orbits: Left sphenoid sinus mucosal thickening. Bilateral maxillary sinus mucosal thickening with on almost complete opacification. Maxillary sinus wall hypertrophy. Otherwise paranasal sinuses and mastoid air cells are clear.  Bilateral lens replacement. Right scleral buckle. Right orbit calcification-likely phleboliths. Otherwise the orbits are unremarkable. Soft tissues: Negative. CT CERVICAL SPINE FINDINGS Alignment: Normal. Skull base and vertebrae: Multilevel mild are moderate degenerative changes of the spine. C6-C7 posterior disc osteophyte complex formation. No associated severe osseous neural foraminal or central canal stenosis. No acute fracture. No aggressive appearing focal osseous lesion or focal pathologic process. Soft tissues and spinal canal: No prevertebral fluid or swelling. No visible canal hematoma. Upper chest: Unremarkable. Other: Atherosclerotic plaque of the carotid arteries within the neck. IMPRESSION: 1. No acute intracranial abnormality. 2.  No acute displaced facial fracture. 3. No acute displaced fracture or traumatic listhesis of the cervical spine. 4. Chronic sinus disease. Electronically Signed   By: Tish Frederickson M.D.   On: 10/09/2023 17:33   CT Cervical Spine Wo Contrast  Result Date: 10/09/2023 CLINICAL DATA:  Head trauma, minor (Age >= 65y); Neck trauma (Age >= 65y); Facial trauma, blunt EXAM: CT HEAD WITHOUT CONTRAST CT MAXILLOFACIAL WITHOUT CONTRAST CT CERVICAL SPINE WITHOUT CONTRAST TECHNIQUE: Multidetector CT imaging of the head, cervical spine, and maxillofacial structures were performed using the standard protocol without intravenous contrast. Multiplanar CT image reconstructions of the cervical spine and maxillofacial structures were also generated. RADIATION DOSE REDUCTION: This exam was performed according to the departmental dose-optimization program which includes automated exposure control, adjustment of the mA and/or kV according to patient size and/or use of iterative reconstruction technique. COMPARISON:  CT head 07/13/2019, x-ray cervical spine  02/01/2011 FINDINGS: CT HEAD FINDINGS Brain: No evidence of large-territorial acute infarction. No parenchymal hemorrhage. No mass  lesion. No extra-axial collection. No mass effect or midline shift. No hydrocephalus. Basilar cisterns are patent. Vascular: No hyperdense vessel. Atherosclerotic calcifications are present within the cavernous internal carotid and vertebral arteries. Skull: No acute fracture or focal lesion. Other: None. CT MAXILLOFACIAL FINDINGS Osseous: No fracture or mandibular dislocation. No destructive process. Patient is edentulous. Sinuses/Orbits: Left sphenoid sinus mucosal thickening. Bilateral maxillary sinus mucosal thickening with on almost complete opacification. Maxillary sinus wall hypertrophy. Otherwise paranasal sinuses and mastoid air cells are clear. Bilateral lens replacement. Right scleral buckle. Right orbit calcification-likely phleboliths. Otherwise the orbits are unremarkable. Soft tissues: Negative. CT CERVICAL SPINE FINDINGS Alignment: Normal. Skull base and vertebrae: Multilevel mild are moderate degenerative changes of the spine. C6-C7 posterior disc osteophyte complex formation. No associated severe osseous neural foraminal or central canal stenosis. No acute fracture. No aggressive appearing focal osseous lesion or focal pathologic process. Soft tissues and spinal canal: No prevertebral fluid or swelling. No visible canal hematoma. Upper chest: Unremarkable. Other: Atherosclerotic plaque of the carotid arteries within the neck. IMPRESSION: 1. No acute intracranial abnormality. 2.  No acute displaced facial fracture. 3. No acute displaced fracture or traumatic listhesis of the cervical spine. 4. Chronic sinus disease. Electronically Signed   By: Tish Frederickson M.D.   On: 10/09/2023 17:33   CT Maxillofacial WO CM  Result Date: 10/09/2023 CLINICAL DATA:  Head trauma, minor (Age >= 65y); Neck trauma (Age >= 65y); Facial trauma, blunt EXAM: CT HEAD WITHOUT CONTRAST CT MAXILLOFACIAL WITHOUT CONTRAST CT CERVICAL SPINE WITHOUT CONTRAST TECHNIQUE: Multidetector CT imaging of the head, cervical spine,  and maxillofacial structures were performed using the standard protocol without intravenous contrast. Multiplanar CT image reconstructions of the cervical spine and maxillofacial structures were also generated. RADIATION DOSE REDUCTION: This exam was performed according to the departmental dose-optimization program which includes automated exposure control, adjustment of the mA and/or kV according to patient size and/or use of iterative reconstruction technique. COMPARISON:  CT head 07/13/2019, x-ray cervical spine 02/01/2011 FINDINGS: CT HEAD FINDINGS Brain: No evidence of large-territorial acute infarction. No parenchymal hemorrhage. No mass lesion. No extra-axial collection. No mass effect or midline shift. No hydrocephalus. Basilar cisterns are patent. Vascular: No hyperdense vessel. Atherosclerotic calcifications are present within the cavernous internal carotid and vertebral arteries. Skull: No acute fracture or focal lesion. Other: None. CT MAXILLOFACIAL FINDINGS Osseous: No fracture or mandibular dislocation. No destructive process. Patient is edentulous. Sinuses/Orbits: Left sphenoid sinus mucosal thickening. Bilateral maxillary sinus mucosal thickening with on almost complete opacification. Maxillary sinus wall hypertrophy. Otherwise paranasal sinuses and mastoid air cells are clear. Bilateral lens replacement. Right scleral buckle. Right orbit calcification-likely phleboliths. Otherwise the orbits are unremarkable. Soft tissues: Negative. CT CERVICAL SPINE FINDINGS Alignment: Normal. Skull base and vertebrae: Multilevel mild are moderate degenerative changes of the spine. C6-C7 posterior disc osteophyte complex formation. No associated severe osseous neural foraminal or central canal stenosis. No acute fracture. No aggressive appearing focal osseous lesion or focal pathologic process. Soft tissues and spinal canal: No prevertebral fluid or swelling. No visible canal hematoma. Upper chest: Unremarkable.  Other: Atherosclerotic plaque of the carotid arteries within the neck. IMPRESSION: 1. No acute intracranial abnormality. 2.  No acute displaced facial fracture. 3. No acute displaced fracture or traumatic listhesis of the cervical spine. 4. Chronic sinus disease. Electronically Signed   By: Tish Frederickson M.D.   On: 10/09/2023 17:33  Microbiology: Results for orders placed or performed in visit on 06/22/23  Urine Culture     Status: Abnormal   Collection Time: 06/22/23  3:17 PM   Specimen: Urine  Result Value Ref Range Status   MICRO NUMBER: [id number]  Final   SPECIMEN QUALITY: Adequate  Final   Sample Source URINE  Final   STATUS: FINAL  Final   ISOLATE 1: Citrobacter koseri (A)  Final    Comment: Greater than 100,000 CFU/mL of Citrobacter koseri      Susceptibility   Citrobacter koseri - URINE CULTURE, REFLEX    AMOX/CLAVULANIC 4 Sensitive     CEFAZOLIN* <=4 Not Reportable      * For infections other than uncomplicated UTI caused by E. coli, K. pneumoniae or P. mirabilis: Cefazolin is resistant if MIC > or = 8 mcg/mL. (Distinguishing susceptible versus intermediate for isolates with MIC < or = 4 mcg/mL requires additional testing.) For uncomplicated UTI caused by E. coli, K. pneumoniae or P. mirabilis: Cefazolin is susceptible if MIC <32 mcg/mL and predicts susceptible to the oral agents cefaclor, cefdinir, cefpodoxime, cefprozil, cefuroxime, cephalexin and loracarbef.     CEFTAZIDIME <=1 Sensitive     CEFEPIME <=1 Sensitive     CEFTRIAXONE <=1 Sensitive     CIPROFLOXACIN <=0.25 Sensitive     LEVOFLOXACIN <=0.12 Sensitive     GENTAMICIN <=1 Sensitive     IMIPENEM <=0.25 Sensitive     NITROFURANTOIN <=16 Sensitive     PIP/TAZO <=4 Sensitive     TOBRAMYCIN <=1 Sensitive     TRIMETH/SULFA* <=20 Sensitive      * For infections other than uncomplicated UTI caused by E. coli, K. pneumoniae or P. mirabilis: Cefazolin is resistant if MIC > or = 8 mcg/mL. (Distinguishing  susceptible versus intermediate for isolates with MIC < or = 4 mcg/mL requires additional testing.) For uncomplicated UTI caused by E. coli, K. pneumoniae or P. mirabilis: Cefazolin is susceptible if MIC <32 mcg/mL and predicts susceptible to the oral agents cefaclor, cefdinir, cefpodoxime, cefprozil, cefuroxime, cephalexin and loracarbef. Legend: S = Susceptible  I = Intermediate R = Resistant  NS = Not susceptible * = Not tested  NR = Not reported **NN = See antimicrobic comments    *Note: Due to a large number of results and/or encounters for the requested time period, some results have not been displayed. A complete set of results can be found in Results Review.    Labs: CBC: Recent Labs  Lab 10/09/23 1634 10/10/23 0939 10/10/23 1830 10/11/23 0503 10/12/23 0331 10/13/23 0435  WBC 6.3 6.0  --  4.9 3.5* 3.0*  NEUTROABS 4.3  --   --  2.6  --   --   HGB 9.2* 6.7* 7.6* 9.0* 8.7* 8.6*  HCT 28.2* 20.3* 23.4* 26.8* 26.2* 26.3*  MCV 102.5* 102.0*  --  96.1 96.3 98.5  PLT 267 210  --  192 201 207   Basic Metabolic Panel: Recent Labs  Lab 10/09/23 1634 10/10/23 0939 10/11/23 0503 10/13/23 0435  NA 137 137 139 140  K 4.7 4.1 4.4 3.7  CL 104 106 106 107  CO2 25 25 25 26   GLUCOSE 197* 174* 123* 140*  BUN 52* 66* 54* 27*  CREATININE 1.33* 1.33* 1.39* 1.28*  CALCIUM 8.5* 8.0* 8.3* 8.4*   Liver Function Tests: Recent Labs  Lab 10/09/23 1634 10/10/23 0939  AST 18 13*  ALT 10 8  ALKPHOS 24* 18*  BILITOT 0.6 0.4  PROT 6.4* 5.2*  ALBUMIN 3.6 2.9*   CBG: Recent Labs  Lab 10/12/23 0513 10/13/23 1959 10/14/23 0506 10/15/23 0421  GLUCAP 134* 186* 129* 112*    Discharge time spent: greater than 30 minutes.  Signed: Enedina Finner, MD [facility] 10/15/2023

## 2023-10-15 NOTE — Op Note (Signed)
[name] [name] [facility] Gastroenterology Patient Name: [name] [name] Procedure Date: 10/15/2023 7:11 AM MRN: [id number] Account #: [id number] Date of Birth: Nov 02, 1932 Admit Type: Inpatient Age: 89 Room: [facility] ENDO ROOM 4 Gender: Female Note Status: Finalized Instrument Name: Prentice Docker [id number] Procedure:             Colonoscopy Indications:           Iron deficiency anemia secondary to chronic blood loss Providers:             Royce Macadamia K. Norma Fredrickson MD, MD Referring MD:          Doreene Nest (Referring MD) Medicines:             Propofol per Anesthesia Complications:         No immediate complications. Estimated blood loss:                         Minimal. Procedure:             Pre-Anesthesia Assessment:                        - The risks and benefits of the procedure and the                         sedation options and risks were discussed with the                         patient. All questions were answered and informed                         consent was obtained.                        - Patient identification and proposed procedure were                         verified prior to the procedure by the nurse. The                         procedure was verified in the procedure room.                        - ASA Grade Assessment: III - A patient with severe                         systemic disease.                        - After reviewing the risks and benefits, the patient                         was deemed in satisfactory condition to undergo the                         procedure.                        After obtaining informed consent, the colonoscope was                         passed  under direct vision. Throughout the procedure,                         the patient's blood pressure, pulse, and oxygen                         saturations were monitored continuously. The                         Colonoscope was introduced through the anus and                          advanced to the the cecum, identified by appendiceal                         orifice and ileocecal valve. The colonoscopy was                         performed without difficulty. The patient tolerated                         the procedure well. The quality of the bowel                         preparation was adequate. The ileocecal valve,                         appendiceal orifice, and rectum were photographed. Findings:      The perianal and digital rectal examinations were normal. Pertinent       negatives include normal sphincter tone and no palpable rectal lesions.      Non-bleeding internal hemorrhoids were found during retroflexion. The       hemorrhoids were Grade II (internal hemorrhoids that prolapse but reduce       spontaneously).      Many large-mouthed and small-mouthed diverticula were found in the left       colon. There was no evidence of diverticular bleeding.      A 19 mm polyp was found in the proximal ascending colon. The polyp was       pedunculated. The polyp was removed with a hot snare. Resection and       retrieval were complete. To prevent bleeding after the polypectomy, one       hemostatic clip was successfully placed (MR conditional). Clip       manufacturer: [employer]. There was no bleeding during, or at the       end, of the procedure.      The exam was otherwise without abnormality. Impression:            - Non-bleeding internal hemorrhoids.                        - Mild diverticulosis in the left colon. There was no                         evidence of diverticular bleeding.                        - One 19 mm polyp in the proximal ascending colon,  removed with a hot snare. Resected and retrieved. Clip                         (MR conditional) was placed. Clip manufacturer: [employer].                        - The examination was otherwise normal. Recommendation:        - Return patient to  hospital ward for ongoing care.                        - Await pathology results from EGD, also performed                         today.                        - Await pathology results.                        - Resume previous diet.                        - Continue present medications.                        - Serial H/H, Serial exams                        - GI service will continue to follow the patient's                         progress and closely observe for any changes in                         clinical status. Procedure Code(s):     --- Professional ---                        [phone], Colonoscopy, flexible; with removal of                         tumor(s), polyp(s), or other lesion(s) by snare                         technique Diagnosis Code(s):     --- Professional ---                        K57.30, Diverticulosis of large intestine without                         perforation or abscess without bleeding                        D50.0, Iron deficiency anemia secondary to blood loss                         (chronic)  D12.2, Benign neoplasm of ascending colon                        K64.1, Second degree hemorrhoids CPT copyright 2022 American Medical Association. All rights reserved. The codes documented in this report are preliminary and upon coder review may  be revised to meet current compliance requirements. Stanton Kidney MD, MD 10/15/2023 9:46:37 AM This report has been signed electronically. Number of Addenda: 0 Note Initiated On: 10/15/2023 7:11 AM Scope Withdrawal Time: 0 hours 7 minutes 20 seconds  Total Procedure Duration: 0 hours 15 minutes 5 seconds  Estimated Blood Loss:  Estimated blood loss was minimal.      [facility]

## 2023-10-15 NOTE — Progress Notes (Signed)
3 sutures removed above pt left eye.

## 2023-10-15 NOTE — Interval H&P Note (Signed)
History and Physical Interval Note:  10/15/2023 8:59 AM  [name] [name]  has presented today for surgery, with the diagnosis of anemia.  The various methods of treatment have been discussed with the patient and family. After consideration of risks, benefits and other options for treatment, the patient has consented to  Procedure(s): ESOPHAGOGASTRODUODENOSCOPY (EGD) WITH PROPOFOL (N/A) COLONOSCOPY WITH PROPOFOL (N/A) as a surgical intervention.  The patient's history has been reviewed, patient examined, no change in status, stable for surgery.  I have reviewed the patient's chart and labs.  Questions were answered to the patient's satisfaction.     [place], [place]

## 2023-10-15 NOTE — Anesthesia Postprocedure Evaluation (Signed)
Anesthesia Post Note  Patient: Sammuel Cooper  Procedure(s) Performed: ESOPHAGOGASTRODUODENOSCOPY (EGD) WITH PROPOFOL COLONOSCOPY WITH PROPOFOL BIOPSY POLYPECTOMY  Patient location during evaluation: PACU Anesthesia Type: General Level of consciousness: awake and alert Pain management: pain level controlled Vital Signs Assessment: post-procedure vital signs reviewed and stable Respiratory status: spontaneous breathing, nonlabored ventilation, respiratory function stable and patient connected to nasal cannula oxygen Cardiovascular status: blood pressure returned to baseline and stable Postop Assessment: no apparent nausea or vomiting Anesthetic complications: no   No notable events documented.   Last Vitals:  Vitals:   10/15/23 0945 10/15/23 1000  BP: 115/60 110/69  Pulse: (!) 101 98  Resp:  15  Temp: 36.4 C   SpO2: 96% 99%    Last Pain:  Vitals:   10/15/23 1000  TempSrc:   PainSc: 0-No pain                 Cleda Mccreedy Roselynne Lortz

## 2023-10-15 NOTE — Plan of Care (Signed)

## 2023-10-15 NOTE — Op Note (Signed)
[facility] Gastroenterology Patient Name: [name] [name] Procedure Date: 10/15/2023 7:15 AM MRN: [id number] Account #: [id number] Date of Birth: May 03, 1932 Admit Type: Inpatient Age: 89 Room: [facility] ENDO ROOM 4 Gender: Female Note Status: Finalized Instrument Name: Upper Endoscope [phone] Procedure:             Upper GI endoscopy Indications:           Iron deficiency anemia secondary to chronic blood loss Providers:             Boykin Nearing. Norma Fredrickson MD, MD Referring MD:          Doreene Nest (Referring MD) Medicines:             Propofol per Anesthesia Complications:         No immediate complications. Estimated blood loss:                         Minimal. Procedure:             Pre-Anesthesia Assessment:                        - The risks and benefits of the procedure and the                         sedation options and risks were discussed with the                         patient. All questions were answered and informed                         consent was obtained.                        - Patient identification and proposed procedure were                         verified prior to the procedure by the nurse. The                         procedure was verified in the procedure room.                        - ASA Grade Assessment: III - A patient with severe                         systemic disease.                        - After reviewing the risks and benefits, the patient                         was deemed in satisfactory condition to undergo the                         procedure.                        After obtaining informed consent, the endoscope was  passed under direct vision. Throughout the procedure,                         the patient's blood pressure, pulse, and oxygen                         saturations were monitored continuously. The Endoscope                         was introduced through the mouth, and advanced to the                          third part of duodenum. The upper GI endoscopy was                         accomplished without difficulty. The patient tolerated                         the procedure well. Findings:      The esophagus was normal.      Multiple medium pedunculated and sessile polyps with no bleeding and no       stigmata of recent bleeding were found in the cardia, in the gastric       fundus and in the gastric body. Biopsies were taken with a cold forceps       for histology. Biopsies were taken with a cold forceps for histology.      A large, polypoid and submucosal, non-circumferential mass with no       bleeding and no stigmata of recent bleeding was found on the lesser       curvature of the stomach. Biopsies were taken with a cold forceps for       histology.      The examined duodenum was normal.      The exam of the stomach was otherwise normal. Impression:            - Normal esophagus.                        - Multiple gastric polyps. Biopsied.                        - Rule out malignancy, gastric tumor on the lesser                         curvature of the stomach. Biopsied.                        - Normal examined duodenum. Recommendation:        - Await pathology results.                        - Proceed with colonoscopy Procedure Code(s):     --- Professional ---                        [phone], Esophagogastroduodenoscopy, flexible,                         transoral; with biopsy, single or multiple Diagnosis Code(s):     --- Professional ---  D50.0, Iron deficiency anemia secondary to blood loss                         (chronic)                        D49.0, Neoplasm of unspecified behavior of digestive                         system                        K31.7, Polyp of stomach and duodenum CPT copyright 2022 American Medical Association. All rights reserved. The codes documented in this report are preliminary and upon coder review may  be revised  to meet current compliance requirements. Stanton Kidney MD, MD 10/15/2023 9:24:07 AM This report has been signed electronically. Number of Addenda: 0 Note Initiated On: 10/15/2023 7:15 AM Estimated Blood Loss:  Estimated blood loss was minimal. Estimated blood loss                         was minimal.      [facility]

## 2023-10-15 NOTE — Anesthesia Preprocedure Evaluation (Signed)
Anesthesia Evaluation  Patient identified by MRN, date of birth, ID band Patient awake    Reviewed: Allergy & Precautions, NPO status , Patient's Chart, lab work & pertinent test results  History of Anesthesia Complications Negative for: history of anesthetic complications  Airway Mallampati: III  TM Distance: <3 FB Neck ROM: full    Dental  (+) Chipped, Upper Dentures, Partial Lower, Poor Dentition, Missing   Pulmonary neg pulmonary ROS, neg shortness of breath   Pulmonary exam normal        Cardiovascular Exercise Tolerance: Good hypertension, + CAD and + Cardiac Stents  Normal cardiovascular exam     Neuro/Psych  PSYCHIATRIC DISORDERS      negative neurological ROS     GI/Hepatic Neg liver ROS,GERD  Controlled,,  Endo/Other  diabetes, Type 2    Renal/GU Renal disease  negative genitourinary   Musculoskeletal   Abdominal   Peds  Hematology negative hematology ROS (+)   Anesthesia Other Findings Bruising on left side face  Past Medical History: 08/16/2019: Acute bilateral thoracic back pain No date: Allergy     Comment:  Cipro, Plavix, Statins No date: Anemia No date: Arthritis No date: Atrial fibrillation (HCC) No date: CAD (coronary artery disease)     Comment:  a. s/p tandem Promus DES to LAD in 2009 by Dr. Juanda Chance;                b.  LHC (03/22/14):  prox LAD 30%, mid LAD 40%, LAD stent               ok with dist 20% ISR, apical LAD occluded with L-L               collats filling apical vessel (too small for PCI), mid               RCA 20%, EF 70%.  Med Rx No date: Cancer [facility]) No date: Chronic kidney disease, stage 3b (HCC) No date: Colon polyps No date: Diabetes mellitus     Comment:  Diagnosed 2012 No date: Diverticulosis of colon (without mention of hemorrhage) 12/2022: DVT (deep venous thrombosis) (HCC)     Comment:  right leg No date: GERD (gastroesophageal reflux disease) No date:  Glaucoma No date: Grade I diastolic dysfunction No date: Grade I diastolic dysfunction 04/24/2020: Herpes zoster without complication No date: Hyperlipidemia     Comment:  intol of statins No date: Hypertension No date: Kidney stones No date: Left atrial dilation No date: Moderate aortic regurgitation No date: Motion sickness     Comment:  back seat cars dx'd 2003: NHL (non-Hodgkin's lymphoma) (HCC)     Comment:  chemo/xrt comp 2003 No date: Personal history of colonic polyps No date: Proctitis 07/18/2014: Pruritic intertrigo No date: Urticaria No date: Wears dentures     Comment:  Full upper, partial lower No date: Wears hearing aid in both ears  Past Surgical History: No date: cardiac cath-neg No date: CATARACT EXTRACTION; Bilateral No date: CORONARY ANGIOPLASTY WITH STENT PLACEMENT     Comment:  x 2 No date: dexa-neg 10/30/2003: KNEE SURGERY     Comment:  left 03/22/2014: LEFT HEART CATHETERIZATION WITH CORONARY ANGIOGRAM; N/A     Comment:  Procedure: LEFT HEART CATHETERIZATION WITH CORONARY               ANGIOGRAM;  Surgeon: Kathleene Hazel, MD;                Location: [facility] CATH LAB;  Service: Cardiovascular;                Laterality: N/A; No date: stress cardiolite No date: VAGINAL HYSTERECTOMY     Comment:  partial, fibroids, one ovary left  BMI    Body Mass Index: 24.42 kg/m      Reproductive/Obstetrics negative OB ROS                             Anesthesia Physical Anesthesia Plan  ASA: 3  Anesthesia Plan: General   Post-op Pain Management:    Induction: Intravenous  PONV Risk Score and Plan: Propofol infusion and TIVA  Airway Management Planned: Natural Airway and Nasal Cannula  Additional Equipment:   Intra-op Plan:   Post-operative Plan:   Informed Consent: I have reviewed the patients History and Physical, chart, labs and discussed the procedure including the risks, benefits and alternatives for the  proposed anesthesia with the patient or authorized representative who has indicated his/her understanding and acceptance.     Dental Advisory Given  Plan Discussed with: Anesthesiologist, CRNA and Surgeon  Anesthesia Plan Comments: (Patient consented for risks of anesthesia including but not limited to:  - adverse reactions to medications - risk of airway placement if required - damage to eyes, teeth, lips or other oral mucosa - nerve damage due to positioning  - sore throat or hoarseness - Damage to heart, brain, nerves, lungs, other parts of body or loss of life  Patient voiced understanding and assent.)       Anesthesia Quick Evaluation

## 2023-10-17 ENCOUNTER — Encounter: Payer: Self-pay | Admitting: Internal Medicine

## 2023-10-17 ENCOUNTER — Telehealth: Payer: Self-pay | Admitting: *Deleted

## 2023-10-17 NOTE — Patient Outreach (Deleted)
{  [facility] TOC PROGRAM NOTES:[id number]}

## 2023-10-17 NOTE — Transitions of Care (Post Inpatient/ED Visit) (Signed)
   10/17/2023  Name: [name] [name] MRN: [id number] DOB: 1932/02/24  Today's TOC FU Call Status: Today's TOC FU Call Status:: Unsuccessful Call (1st Attempt) Unsuccessful Call (1st Attempt) Date: 10/17/23  Attempted to reach the patient regarding the most recent Inpatient/ED visit.  Follow Up Plan: Additional outreach attempts will be made to reach the patient to complete the Transitions of Care (Post Inpatient/ED visit) call.   Gean Maidens BSN RN Population Health- Transition of Care Team.  [facility] [phone]

## 2023-10-17 NOTE — Transitions of Care (Post Inpatient/ED Visit) (Signed)
10/17/2023  Name: [name] [name] MRN: [id number] DOB: 1932-11-11  Today's TOC FU Call Status: Today's TOC FU Call Status:: Successful TOC FU Call Completed TOC FU Call Complete Date: 10/17/23 Patient's Name and Date of Birth confirmed.  Transition Care Management Follow-up Telephone Call Date of Discharge: 10/17/23 Discharge Facility: [facility]) Type of Discharge: Inpatient Admission Primary Inpatient Discharge Diagnosis:: syncope and collapse How have you been since you were released from the hospital?: Better Any questions or concerns?: No  Items Reviewed: Did you receive and understand the discharge instructions provided?: Yes Medications obtained,verified, and reconciled?: Yes (Medications Reviewed) Any new allergies since your discharge?: No Dietary orders reviewed?: No Do you have support at home?: Yes People in Home: alone Name of Support/Comfort Primary Source: kelly  Medications Reviewed Today: Medications Reviewed Today     Reviewed by Luella Cook, RN (Case Manager) on 10/17/23 at [phone]  Med List Status: <None>   Medication Order Taking? Sig Documenting Provider Last Dose Status Informant  Bempedoic Acid-Ezetimibe (NEXLIZET) 180-10 MG TABS [id number] Yes Take 1 tablet by mouth daily. Kathleene Hazel, MD Taking Active Self  Blood Glucose Monitoring Suppl DEVI [id number] Yes 1 each by Does not apply route in the morning, at noon, and at bedtime. May substitute to any manufacturer covered by patient's insurance. Doreene Nest, NP Taking Active Self  brimonidine (ALPHAGAN) 0.2 % ophthalmic solution [id number] Yes Place 1 drop into the right eye in the morning and at bedtime. [provider] Taking Active Self  Cholecalciferol (VITAMIN D-3) 1000 UNITS CAPS [id number] Yes Take 1 capsule by mouth daily. [provider] Taking Active Self  CRANBERRY PO [id number] Yes Take 1 tablet by mouth daily. [provider] Taking Active Self  diclofenac Sodium (VOLTAREN) 1 % GEL [id number] Yes APPLY 2 GRAMS TOPICALLY THREE TIMES A DAY AS NEEDED Doreene Nest, NP Taking Active Self  dorzolamide-timolol (COSOPT) 2-0.5 % ophthalmic solution [id number] Yes Place 1 drop into the right eye 2 (two) times daily. [provider] Taking Active Self  Dulaglutide (TRULICITY) 0.75 MG/0.5ML SOPN [id number] Yes Inject 0.75 mg into the skin once a week. for diabetes. Doreene Nest, NP Taking Active Self  ELIQUIS 5 MG TABS tablet [id number] Yes TAKE 1 TABLET TWICE DAILY Kathleene Hazel, MD Taking Active Self  ferrous sulfate 325 (65 FE) MG tablet [id number] Yes Take 1 tablet (325 mg total) by mouth daily with breakfast. Meredith Pel, NP Taking Active Self  fexofenadine (ALLEGRA) 180 MG tablet [id number] Yes Take 1 tablet (180 mg total) by mouth as needed for allergies or rhinitis. Doreene Nest, NP Taking Active Self           Med Note Littie Deeds, CHERYL A   Wed Jun 15, 2023  2:21 PM) As needed  furosemide (LASIX) 20 MG tablet [id number] Yes Take 2 tablets by mouth every morning for 3-5 days for fluid build up. Doreene Nest, NP Taking Active Self  gabapentin (NEURONTIN) 100 MG capsule [id number] Yes Take 1 capsule (100 mg total) by mouth 3 (three) times daily. Felecia Shelling, DPM Taking Active Self           Med Note Littie Deeds, CHERYL A   Wed Jun 15, 2023  2:20 PM) 1 in the morning and 2 at night  glipiZIDE (GLUCOTROL XL) 10 MG 24 hr tablet [id number] Yes Take 1 tablet (10 mg total) by mouth daily with breakfast. for diabetes. Doreene Nest, NP  Taking Active Self  Glucose Blood (BLOOD GLUCOSE TEST STRIPS) STRP [id number] Yes 1 each by In Vitro route in the morning, at noon, and at bedtime. May substitute to any manufacturer covered by patient's insurance. Doreene Nest, NP Taking Active Self  isosorbide mononitrate (IMDUR) 60 MG 24 hr tablet [id number] Yes Take 1.5 tablets (90 mg total) by mouth  daily. Kathleene Hazel, MD Taking Active Self  Lancets Misc. MISC [id number] Yes 1 each by Does not apply route in the morning, at noon, and at bedtime. May substitute to any manufacturer covered by patient's insurance. Doreene Nest, NP Taking Active Self    Discontinued 02/15/12 0800   metoprolol succinate (TOPROL-XL) 100 MG 24 hr tablet [id number] Yes Take one (1) tablet by mouth (100 mg) daily. Take with or immediately following a meal. Kathleene Hazel, MD Taking Active Self  nitroGLYCERIN (NITROSTAT) 0.4 MG SL tablet [id number] Yes Place 1 tablet (0.4 mg total) under the tongue every 5 (five) minutes as needed for chest pain. Kathleene Hazel, MD Taking Active Self  pantoprazole (PROTONIX) 20 MG tablet [id number] Yes TAKE 1 TABLET BY MOUTH DAILY FOR Sima Matas, NP Taking Active Self  ROCKLATAN 0.02-0.005 % SOLN [id number] Yes Place 1 drop into the right eye at bedtime. [provider] Taking Active Self  sertraline (ZOLOFT) 100 MG tablet [id number] Yes TAKE 1 TABLET BY MOUTH DAILY FOR ANXIETY AND DEPRESSION Doreene Nest, NP Taking Active Self  triamcinolone cream (KENALOG) 0.1 % [id number] Yes Apply 1 Application topically 2 (two) times daily.  Patient taking differently: Apply 1 Application topically 2 (two) times daily as needed.   Felecia Shelling, DPM Taking Active Self  vitamin B-12 (CYANOCOBALAMIN) 1000 MCG tablet [id number] Yes Take 1,000 mcg by mouth daily. [provider] Taking Active Self            Goals Addressed             This Visit's Progress    TOC 30 day program       Current Barriers:  Knowledge Deficits related to plan of care for management of fall related to syncope and collapse  Need to Medical Alert system  RNCM Clinical Goal(s):  Patient will work with the Care Management team over the next 30 days to address Transition of Care Barriers: Provider appointments take all medications exactly as  prescribed and will call provider for medication related questions as evidenced by EMR attend all scheduled medical appointments: PCP and specialist as evidenced by EMR  through collaboration with RN Care manager, provider, and care team.   Interventions: Evaluation of current treatment plan related to  self management and patient's adherence to plan as established by provider Care guide schedule follow up appt wit Vernona Rieger NP [phone]   Falls Interventions:  (Status:  New goal.) Long Term Goal Provided written and verbal education re: potential causes of falls and Fall prevention strategies Assessed for signs and symptoms of orthostatic hypotension Provided patient information for fall alert systems RN sent the patient daughter an email with the copy of the medical alert system offered by [facility] volunteer dept  Patient Goals/Self-Care Activities: Participate in Transition of Care Program/Attend [facility] scheduled calls Notify RN Care Manager of TOC call rescheduling needs Take all medications as prescribed Attend all scheduled provider appointments Call pharmacy for medication refills 3-7 days in advance of running out of medications Call provider office for new concerns or questions    Follow Up  Plan:  Telephone follow up appointment with care management team member scheduled for:  10/26/2023 [facility] Care guide set a follow up appt with Vernona Rieger NP Thursday 10/20/2023 2 PM        Home Care and Equipment/Supplies: Were Home Health Services Ordered?: NA Any new equipment or medical supplies ordered?: NA  Functional Questionnaire: Do you need assistance with bathing/showering or dressing?: No Do you need assistance with meal preparation?: No Do you need assistance with eating?: No Do you have difficulty maintaining continence: No Do you need assistance with getting out of bed/getting out of a chair/moving?: No  Follow up appointments reviewed: PCP Follow-up  appointment confirmed?: Yes Date of PCP follow-up appointment?: 10/20/23 Follow-up Provider: Vernona Rieger NP Specialist Hospital Follow-up appointment confirmed?: Yes Date of Specialist follow-up appointment?: 10/18/23 Follow-Up Specialty Provider:: [id number] Eye [id number] Verne Carrow Do you need transportation to your follow-up appointment?: No (Daughter will take pt) Do you understand care options if your condition(s) worsen?: Yes-patient verbalized understanding  SDOH Interventions Today    Flowsheet Row Most Recent Value  SDOH Interventions   Food Insecurity Interventions Intervention Not Indicated  Housing Interventions Intervention Not Indicated  Transportation Interventions Intervention Not Indicated  Utilities Interventions Intervention Not Indicated      Follow up Tephone follow up appointment with care management team member scheduled for:  10/26/2023 [facility] Care guide set a follow up appt with Vernona Rieger NP Thursday 10/20/2023 2 PM      Emailed medical alert information to the [place] daughter.   Gean Maidens BSN RN Population Health- Transition of Care Team.  [facility] [phone]

## 2023-10-17 NOTE — Transitions of Care (Post Inpatient/ED Visit) (Deleted)
10/17/2023  Name: [name] [name] MRN: [id number] DOB: 04/10/1932  Today's TOC FU Call Status: Today's TOC FU Call Status:: Successful TOC FU Call Completed TOC FU Call Complete Date: 10/17/23 Patient's Name and Date of Birth confirmed.  Transition Care Management Follow-up Telephone Call Date of Discharge: 10/17/23 Discharge Facility: [facility]) Type of Discharge: Inpatient Admission Primary Inpatient Discharge Diagnosis:: syncope and collapse How have you been since you were released from the hospital?: Better Any questions or concerns?: No  Items Reviewed: Did you receive and understand the discharge instructions provided?: Yes Medications obtained,verified, and reconciled?: Yes (Medications Reviewed) Any new allergies since your discharge?: No Dietary orders reviewed?: No Do you have support at home?: Yes People in Home: alone Name of Support/Comfort Primary Source: kelly  Medications Reviewed Today: Medications Reviewed Today     Reviewed by Luella Cook, RN (Case Manager) on 10/17/23 at [phone]  Med List Status: <None>   Medication Order Taking? Sig Documenting Provider Last Dose Status Informant  Bempedoic Acid-Ezetimibe (NEXLIZET) 180-10 MG TABS [id number] Yes Take 1 tablet by mouth daily. Kathleene Hazel, MD Taking Active Self  Blood Glucose Monitoring Suppl DEVI [id number] Yes 1 each by Does not apply route in the morning, at noon, and at bedtime. May substitute to any manufacturer covered by patient's insurance. Doreene Nest, NP Taking Active Self  brimonidine (ALPHAGAN) 0.2 % ophthalmic solution [id number] Yes Place 1 drop into the right eye in the morning and at bedtime. [provider] Taking Active Self  Cholecalciferol (VITAMIN D-3) 1000 UNITS CAPS [id number] Yes Take 1 capsule by mouth daily. [provider] Taking Active Self  CRANBERRY PO [id number] Yes Take 1 tablet by mouth daily. [provider] Taking Active Self  diclofenac Sodium (VOLTAREN) 1 % GEL [id number] Yes APPLY 2 GRAMS TOPICALLY THREE TIMES A DAY AS NEEDED Doreene Nest, NP Taking Active Self  dorzolamide-timolol (COSOPT) 2-0.5 % ophthalmic solution [id number] Yes Place 1 drop into the right eye 2 (two) times daily. [provider] Taking Active Self  Dulaglutide (TRULICITY) 0.75 MG/0.5ML SOPN [id number] Yes Inject 0.75 mg into the skin once a week. for diabetes. Doreene Nest, NP Taking Active Self  ELIQUIS 5 MG TABS tablet [id number] Yes TAKE 1 TABLET TWICE DAILY Kathleene Hazel, MD Taking Active Self  ferrous sulfate 325 (65 FE) MG tablet [id number] Yes Take 1 tablet (325 mg total) by mouth daily with breakfast. Meredith Pel, NP Taking Active Self  fexofenadine (ALLEGRA) 180 MG tablet [id number] Yes Take 1 tablet (180 mg total) by mouth as needed for allergies or rhinitis. Doreene Nest, NP Taking Active Self           Med Note Littie Deeds, CHERYL A   Wed Jun 15, 2023  2:21 PM) As needed  furosemide (LASIX) 20 MG tablet [id number] Yes Take 2 tablets by mouth every morning for 3-5 days for fluid build up. Doreene Nest, NP Taking Active Self  gabapentin (NEURONTIN) 100 MG capsule [id number] Yes Take 1 capsule (100 mg total) by mouth 3 (three) times daily. Felecia Shelling, DPM Taking Active Self           Med Note Littie Deeds, CHERYL A   Wed Jun 15, 2023  2:20 PM) 1 in the morning and 2 at night  glipiZIDE (GLUCOTROL XL) 10 MG 24 hr tablet [id number] Yes Take 1 tablet (10 mg total) by mouth daily with breakfast. for diabetes. Doreene Nest, NP  Taking Active Self  Glucose Blood (BLOOD GLUCOSE TEST STRIPS) STRP [id number] Yes 1 each by In Vitro route in the morning, at noon, and at bedtime. May substitute to any manufacturer covered by patient's insurance. Doreene Nest, NP Taking Active Self  isosorbide mononitrate (IMDUR) 60 MG 24 hr tablet [id number] Yes Take 1.5 tablets (90 mg total) by mouth  daily. Kathleene Hazel, MD Taking Active Self  Lancets Misc. MISC [id number] Yes 1 each by Does not apply route in the morning, at noon, and at bedtime. May substitute to any manufacturer covered by patient's insurance. Doreene Nest, NP Taking Active Self    Discontinued 02/15/12 0800   metoprolol succinate (TOPROL-XL) 100 MG 24 hr tablet [id number] Yes Take one (1) tablet by mouth (100 mg) daily. Take with or immediately following a meal. Kathleene Hazel, MD Taking Active Self  nitroGLYCERIN (NITROSTAT) 0.4 MG SL tablet [id number] Yes Place 1 tablet (0.4 mg total) under the tongue every 5 (five) minutes as needed for chest pain. Kathleene Hazel, MD Taking Active Self  pantoprazole (PROTONIX) 20 MG tablet [id number] Yes TAKE 1 TABLET BY MOUTH DAILY FOR Sima Matas, NP Taking Active Self  ROCKLATAN 0.02-0.005 % SOLN [id number] Yes Place 1 drop into the right eye at bedtime. [provider] Taking Active Self  sertraline (ZOLOFT) 100 MG tablet [id number] Yes TAKE 1 TABLET BY MOUTH DAILY FOR ANXIETY AND DEPRESSION Doreene Nest, NP Taking Active Self  triamcinolone cream (KENALOG) 0.1 % [id number] Yes Apply 1 Application topically 2 (two) times daily.  Patient taking differently: Apply 1 Application topically 2 (two) times daily as needed.   Felecia Shelling, DPM Taking Active Self  vitamin B-12 (CYANOCOBALAMIN) 1000 MCG tablet [id number] Yes Take 1,000 mcg by mouth daily. [provider] Taking Active Self            Home Care and Equipment/Supplies: Were Home Health Services Ordered?: NA Any new equipment or medical supplies ordered?: NA  Functional Questionnaire: Do you need assistance with bathing/showering or dressing?: No Do you need assistance with meal preparation?: No Do you need assistance with eating?: No Do you have difficulty maintaining continence: No Do you need assistance with getting out of bed/getting out of a  chair/moving?: No  Follow up appointments reviewed: PCP Follow-up appointment confirmed?: Yes Date of PCP follow-up appointment?: 10/20/23 Follow-up Provider: Vernona Rieger NP Specialist Hospital Follow-up appointment confirmed?: Yes Date of Specialist follow-up appointment?: 10/18/23 Follow-Up Specialty Provider:: [id number] Eye [id number] Verne Carrow Do you need transportation to your follow-up appointment?: No (Daughter will take pt) Do you understand care options if your condition(s) worsen?: Yes-patient verbalized understanding  SDOH Interventions Today    Flowsheet Row Most Recent Value  SDOH Interventions   Food Insecurity Interventions Intervention Not Indicated  Housing Interventions Intervention Not Indicated  Transportation Interventions Intervention Not Indicated  Utilities Interventions Intervention Not Indicated       Goals Addressed             This Visit's Progress    TOC 30 day program       Current Barriers:  Knowledge Deficits related to plan of care for management of fall related to syncope and collapse  Need to Medical Alert system  RNCM Clinical Goal(s):  Patient will work with the Care Management team over the next 30 days to address Transition of Care Barriers: Provider appointments take all medications exactly as prescribed and will call provider for medication related questions as  evidenced by EMR attend all scheduled medical appointments: PCP and specialist as evidenced by EMR  through collaboration with RN Care manager, provider, and care team.   Interventions: Evaluation of current treatment plan related to  self management and patient's adherence to plan as established by provider Care guide schedule follow up appt wit Vernona Rieger NP [phone]   Falls Interventions:  (Status:  New goal.) Long Term Goal Provided written and verbal education re: potential causes of falls and Fall prevention strategies Assessed for signs and  symptoms of orthostatic hypotension Provided patient information for fall alert systems RN sent the patient daughter an email with the copy of the medical alert system offered by [facility] volunteer dept  Patient Goals/Self-Care Activities: Participate in Transition of Care Program/Attend [facility] scheduled calls Notify RN Care Manager of TOC call rescheduling needs Take all medications as prescribed Attend all scheduled provider appointments Call pharmacy for medication refills 3-7 days in advance of running out of medications Call provider office for new concerns or questions    Follow Up Plan:  Telephone follow up appointment with care management team member scheduled for:  10/26/2023 [facility] Care guide set a follow up appt with Vernona Rieger NP Thursday 10/20/2023 2 PM       Emailed medical alert information to the [place] daughter.  Gean Maidens BSN RN Population Health- Transition of Care Team.  [facility] [phone]

## 2023-10-18 LAB — SURGICAL PATHOLOGY

## 2023-10-20 ENCOUNTER — Ambulatory Visit: Payer: Medicare PPO | Admitting: Primary Care

## 2023-10-20 ENCOUNTER — Encounter: Payer: Self-pay | Admitting: Primary Care

## 2023-10-20 VITALS — BP 138/76 | HR 81 | Temp 97.0°F | Ht 69.0 in | Wt 169.0 lb

## 2023-10-20 DIAGNOSIS — D649 Anemia, unspecified: Secondary | ICD-10-CM

## 2023-10-20 DIAGNOSIS — R55 Syncope and collapse: Secondary | ICD-10-CM | POA: Diagnosis not present

## 2023-10-20 DIAGNOSIS — D62 Acute posthemorrhagic anemia: Secondary | ICD-10-CM

## 2023-10-20 NOTE — Patient Instructions (Signed)
Stop by the lab prior to leaving today. I will notify you of your results once received.   It was a pleasure to see you today!  

## 2023-10-20 NOTE — Assessment & Plan Note (Signed)
Resolved. Recent hospitalization. Hospital notes, labs, imaging reviewed.   Repeat CBC pending.

## 2023-10-20 NOTE — Progress Notes (Signed)
Subjective:    Patient ID: [name] [name], female    DOB: 17-Apr-1932, 89 y.o.   MRN: [id number]  HPI  [name] [name] is a very pleasant 89 y.o. female with a significant medical history including hypertension, CAD, DVT, atrial fibrillation, type 2 diabetes, renal insufficiency, hyperlipidemia, chronic fatigue, lower extremity edema, chronic knee pain, dark stools, anemia who presents today for hospital follow-up.  She originally presented to [facility] ED on 10/09/2023 for syncope and sustained laceration to the left lateral eyebrow.  Symptoms occurred while standing up from the toilet.  Initial CT head was negative for acute intracranial abnormalities.  Labs revealed chronic anemia.  She was admitted for further evaluation.  During her hospital stay she was treated with IV Protonix.  Hemoglobin levels deteriorated (from 9.8-6.7) so she was transfused with 2 units of packed red blood cells.  GI consulted who recommended upper endoscopy and colonoscopy.  Already following with GI in the outpatient setting and had declined endoscopy and colonoscopy previously.  She underwent EGD and colonoscopy.  EGD revealed a normal esophagus, multiple gastric polyps which were biopsied.  Colonoscopy revealed nonbleeding internal hemorrhoids, mild diverticulosis without acute diverticulitis or diverticular bleeding.  She had a 19 mm polyp in the proximal ascending colon which was removed, resected, retrieved.  Pathology pending.  She was discharged home on 10/15/2023.   Since her discharge home she's feeling better. Her energy levels have improved. She is compliant to her iron pills once daily. She denies bright red rectal bleeding. She believes she has 2 sutures remaining to her left eyebrow. She is staying with a family member now.   BP Readings from Last 3 Encounters:  10/20/23 138/76  10/15/23 110/69  09/30/23 128/76      Review of Systems  Constitutional:  Positive for fatigue.  Respiratory:  Negative for  shortness of breath.   Cardiovascular:  Negative for chest pain.  Gastrointestinal:  Negative for abdominal pain, blood in stool, constipation and diarrhea.  Neurological:  Negative for dizziness.         Past Medical History:  Diagnosis Date   Acute bilateral thoracic back pain 08/16/2019   Allergy    Cipro, Plavix, Statins   Anemia    Arthritis    Atrial fibrillation (HCC)    CAD (coronary artery disease)    a. s/p tandem Promus DES to LAD in 2009 by Dr. Juanda Chance;  b.  LHC (03/22/14):  prox LAD 30%, mid LAD 40%, LAD stent ok with dist 20% ISR, apical LAD occluded with L-L collats filling apical vessel (too small for PCI), mid RCA 20%, EF 70%.  Med Rx   Cancer [facility])    Chronic kidney disease, stage 3b (HCC)    Colon polyps    Diabetes mellitus    Diagnosed 2012   Diverticulosis of colon (without mention of hemorrhage)    DVT (deep venous thrombosis) (HCC) 12/2022   right leg   GERD (gastroesophageal reflux disease)    Glaucoma    Grade I diastolic dysfunction    Grade I diastolic dysfunction    Herpes zoster without complication 04/24/2020   Hyperlipidemia    intol of statins   Hypertension    Kidney stones    Left atrial dilation    Moderate aortic regurgitation    Motion sickness    back seat cars   NHL (non-Hodgkin's lymphoma) (HCC) dx'd 2003   chemo/xrt comp 2003   Personal history of colonic polyps    Proctitis  Pruritic intertrigo 07/18/2014   Urticaria    Wears dentures    Full upper, partial lower   Wears hearing aid in both ears     Social History   Socioeconomic History   Marital status: Widowed    Spouse name: Not on file   Number of children: 0   Years of education: Not on file   Highest education level: 12th grade  Occupational History   Occupation: Retired    Associate Professor: RETIRED   Occupation: retired  Tobacco Use   Smoking status: Never   Smokeless tobacco: Never  Vaping Use   Vaping status: Never Used  Substance and Sexual Activity    Alcohol use: No   Drug use: No   Sexual activity: Not Currently  Other Topics Concern   Not on file  Social History Narrative   Not on file   Social Determinants of Health   Financial Resource Strain: Low Risk  (09/29/2023)   Overall Financial Resource Strain (CARDIA)    Difficulty of Paying Living Expenses: Not hard at all  Food Insecurity: No Food Insecurity (10/17/2023)   Hunger Vital Sign    Worried About Running Out of Food in the Last Year: Never true    Ran Out of Food in the Last Year: Never true  Transportation Needs: No Transportation Needs (10/17/2023)   PRAPARE - Administrator, Civil Service (Medical): No    Lack of Transportation (Non-Medical): No  Physical Activity: Unknown (09/29/2023)   Exercise Vital Sign    Days of Exercise per Week: 0 days    Minutes of Exercise per Session: Not on file  Stress: No Stress Concern Present (09/29/2023)   [employer] of Occupational Health - Occupational Stress Questionnaire    Feeling of Stress : Not at all  Social Connections: Moderately Integrated (09/29/2023)   Social Connection and Isolation Panel [NHANES]    Frequency of Communication with Friends and Family: More than three times a week    Frequency of Social Gatherings with Friends and Family: More than three times a week    Attends Religious Services: More than 4 times per year    Active Member of [employer] or Organizations: Yes    Attends Banker Meetings: More than 4 times per year    Marital Status: Widowed  Intimate Partner Violence: Not At Risk (10/17/2023)   Humiliation, Afraid, Rape, and Kick questionnaire    Fear of Current or Ex-Partner: No    Emotionally Abused: No    Physically Abused: No    Sexually Abused: No    Past Surgical History:  Procedure Laterality Date   BIOPSY  10/15/2023   Procedure: BIOPSY;  Surgeon: Norma Fredrickson, Boykin Nearing, MD;  Location: [facility] ENDOSCOPY;  Service: Gastroenterology;;   cardiac cath-neg     CATARACT  EXTRACTION Bilateral    COLONOSCOPY WITH PROPOFOL N/A 10/15/2023   Procedure: COLONOSCOPY WITH PROPOFOL;  Surgeon: Toledo, Boykin Nearing, MD;  Location: [facility] ENDOSCOPY;  Service: Gastroenterology;  Laterality: N/A;   CORONARY ANGIOPLASTY WITH STENT PLACEMENT     x 2   dexa-neg     ESOPHAGOGASTRODUODENOSCOPY (EGD) WITH PROPOFOL N/A 10/15/2023   Procedure: ESOPHAGOGASTRODUODENOSCOPY (EGD) WITH PROPOFOL;  Surgeon: Toledo, Boykin Nearing, MD;  Location: [facility] ENDOSCOPY;  Service: Gastroenterology;  Laterality: N/A;   HEMOSTASIS CLIP PLACEMENT  10/15/2023   Procedure: HEMOSTASIS CLIP PLACEMENT;  Surgeon: Norma Fredrickson, Boykin Nearing, MD;  Location: [facility] ENDOSCOPY;  Service: Gastroenterology;;   KNEE SURGERY  10/30/2003   left  LEFT HEART CATHETERIZATION WITH CORONARY ANGIOGRAM N/A 03/22/2014   Procedure: LEFT HEART CATHETERIZATION WITH CORONARY ANGIOGRAM;  Surgeon: Kathleene Hazel, MD;  Location: [facility] CATH LAB;  Service: Cardiovascular;  Laterality: N/A;   POLYPECTOMY  10/15/2023   Procedure: POLYPECTOMY;  Surgeon: Toledo, Boykin Nearing, MD;  Location: [facility] ENDOSCOPY;  Service: Gastroenterology;;   stress cardiolite     VAGINAL HYSTERECTOMY     partial, fibroids, one ovary left    Family History  Problem Relation Age of Onset   Cancer Mother        jaw   Hypertension Father    Heart attack Father    Heart attack Sister    Cancer Sister        unknown primary-mets throughout body   Colon cancer Neg Hx    Esophageal cancer Neg Hx    Pancreatic cancer Neg Hx    Stomach cancer Neg Hx    Liver disease Neg Hx     Allergies  Allergen Reactions   Other Rash and Anaphylaxis   Shellfish Allergy Anaphylaxis and Rash   Cephalexin     REACTION: trash and swelling   Ciprofloxacin     REACTION: u/k   Clopidogrel Bisulfate     REACTION: swelling, rash   Ezetimibe-Simvastatin     REACTION: myalgias   Lidocaine Hives    ONLY TO ADHESIVE LIDOCAINE PATCHES. NO PROBLEM WITH INJECTABLE LIDOCAINE.    Nitrofurantoin     REACTION: [place]   Pregabalin     REACTION: felt bad   Celecoxib Rash    REACTION: [place]   Codeine Rash   Codeine Phosphate Rash   Hydrocod Poli-Chlorphe Poli Er Rash   Propoxyphene Rash   Propoxyphene N-Acetaminophen Rash   Rofecoxib Rash   Sulfa Antibiotics Rash   Sulfamethoxazole Rash   Sulfonamide Derivatives Rash    Current Outpatient Medications on File Prior to Visit  Medication Sig Dispense Refill   Bempedoic Acid-Ezetimibe (NEXLIZET) 180-10 MG TABS Take 1 tablet by mouth daily. 90 tablet 3   Blood Glucose Monitoring Suppl DEVI 1 each by Does not apply route in the morning, at noon, and at bedtime. May substitute to any manufacturer covered by patient's insurance. 1 each 0   brimonidine (ALPHAGAN) 0.2 % ophthalmic solution Place 1 drop into the right eye in the morning and at bedtime.     Cholecalciferol (VITAMIN D-3) 1000 UNITS CAPS Take 1 capsule by mouth daily.     CRANBERRY PO Take 1 tablet by mouth daily.     diclofenac Sodium (VOLTAREN) 1 % GEL APPLY 2 GRAMS TOPICALLY THREE TIMES A DAY AS NEEDED 100 g 2   dorzolamide-timolol (COSOPT) 2-0.5 % ophthalmic solution Place 1 drop into the right eye 2 (two) times daily.     Dulaglutide (TRULICITY) 0.75 MG/0.5ML SOPN Inject 0.75 mg into the skin once a week. for diabetes. 6 mL 1   ELIQUIS 5 MG TABS tablet TAKE 1 TABLET TWICE DAILY 180 tablet 3   ferrous sulfate 325 (65 FE) MG tablet Take 1 tablet (325 mg total) by mouth daily with breakfast. 30 tablet 2   fexofenadine (ALLEGRA) 180 MG tablet Take 1 tablet (180 mg total) by mouth as needed for allergies or rhinitis. 90 tablet 0   furosemide (LASIX) 20 MG tablet Take 2 tablets by mouth every morning for 3-5 days for fluid build up. 10 tablet 0   gabapentin (NEURONTIN) 100 MG capsule Take 1 capsule (100 mg total) by mouth 3 (three) times daily.  90 capsule 3   glipiZIDE (GLUCOTROL XL) 10 MG 24 hr tablet Take 1 tablet (10 mg total) by mouth daily with breakfast. for  diabetes. 90 tablet 1   Glucose Blood (BLOOD GLUCOSE TEST STRIPS) STRP 1 each by In Vitro route in the morning, at noon, and at bedtime. May substitute to any manufacturer covered by patient's insurance. 300 strip 5   isosorbide mononitrate (IMDUR) 60 MG 24 hr tablet Take 1.5 tablets (90 mg total) by mouth daily. 135 tablet 1   Lancets Misc. MISC 1 each by Does not apply route in the morning, at noon, and at bedtime. May substitute to any manufacturer covered by patient's insurance. 300 each 5   metoprolol succinate (TOPROL-XL) 100 MG 24 hr tablet Take one (1) tablet by mouth (100 mg) daily. Take with or immediately following a meal. 90 tablet 3   nitroGLYCERIN (NITROSTAT) 0.4 MG SL tablet Place 1 tablet (0.4 mg total) under the tongue every 5 (five) minutes as needed for chest pain. 75 tablet 2   pantoprazole (PROTONIX) 20 MG tablet TAKE 1 TABLET BY MOUTH DAILY FOR HEARTBURN 90 tablet 2   ROCKLATAN 0.02-0.005 % SOLN Place 1 drop into the right eye at bedtime.     sertraline (ZOLOFT) 100 MG tablet TAKE 1 TABLET BY MOUTH DAILY FOR ANXIETY AND DEPRESSION 90 tablet 2   triamcinolone cream (KENALOG) 0.1 % Apply 1 Application topically 2 (two) times daily. (Patient taking differently: Apply 1 Application topically 2 (two) times daily as needed.) 30 g 1   vitamin B-12 (CYANOCOBALAMIN) 1000 MCG tablet Take 1,000 mcg by mouth daily.     [DISCONTINUED] loratadine (CLARITIN) 10 MG tablet Take 10 mg by mouth as needed.      No current facility-administered medications on file prior to visit.    BP 138/76   Pulse 81   Temp (!) 97 F (36.1 C) (Temporal)   Ht 5\' 9"  (1.753 m)   Wt 169 lb (76.7 kg)   SpO2 98%   BMI 24.96 kg/m  Objective:   Physical Exam Cardiovascular:     Rate and Rhythm: Normal rate and regular rhythm.  Pulmonary:     Effort: Pulmonary effort is normal.     Breath sounds: Normal breath sounds.  Musculoskeletal:     Cervical back: Neck supple.  Skin:    General: Skin is warm and  dry.     Comments: 2 small blue suture fragments noted to left eyebrow  Neurological:     Mental Status: She is alert and oriented to person, place, and time.  Psychiatric:        Mood and Affect: Mood normal.           Assessment & Plan:  Syncope and collapse Assessment & Plan: Recent hospital admission. Hospital labs, imaging and notes reviewed.  Unclear etiology regarding anemia.  Reviewed EGD and colonoscopy reports including pathology results.  Repeat CBC pending today.  Consider hematology referral.  2 small suture fragments removed from left eyebrow laceration.   Acute post-hemorrhagic anemia Assessment & Plan: Resolved. Recent hospitalization. Hospital notes, labs, imaging reviewed.   Repeat CBC pending.      Anemia, unspecified type -     CBC        Doreene Nest, NP

## 2023-10-20 NOTE — Assessment & Plan Note (Signed)
Recent hospital admission. Hospital labs, imaging and notes reviewed.  Unclear etiology regarding anemia.  Reviewed EGD and colonoscopy reports including pathology results.  Repeat CBC pending today.  Consider hematology referral.  2 small suture fragments removed from left eyebrow laceration.

## 2023-10-21 ENCOUNTER — Other Ambulatory Visit: Payer: Self-pay | Admitting: Primary Care

## 2023-10-21 DIAGNOSIS — D649 Anemia, unspecified: Secondary | ICD-10-CM

## 2023-10-21 LAB — CBC
HCT: 27.1 % — ABNORMAL LOW (ref 36.0–46.0)
Hemoglobin: 8.9 g/dL — ABNORMAL LOW (ref 12.0–15.0)
MCHC: 33 g/dL (ref 30.0–36.0)
MCV: 96.6 fL (ref 78.0–100.0)
Platelets: 387 10*3/uL (ref 150.0–400.0)
RBC: 2.81 Mil/uL — ABNORMAL LOW (ref 3.87–5.11)
RDW: 17 % — ABNORMAL HIGH (ref 11.5–15.5)
WBC: 4.4 10*3/uL (ref 4.0–10.5)

## 2023-10-26 ENCOUNTER — Other Ambulatory Visit: Payer: Self-pay

## 2023-10-26 ENCOUNTER — Telehealth: Payer: Self-pay

## 2023-10-26 ENCOUNTER — Ambulatory Visit: Payer: Medicare PPO | Attending: Cardiovascular Disease | Admitting: Cardiovascular Disease

## 2023-10-26 ENCOUNTER — Encounter: Payer: Self-pay | Admitting: Cardiovascular Disease

## 2023-10-26 VITALS — BP 136/72 | HR 94 | Ht 69.0 in | Wt 172.6 lb

## 2023-10-26 DIAGNOSIS — I25118 Atherosclerotic heart disease of native coronary artery with other forms of angina pectoris: Secondary | ICD-10-CM

## 2023-10-26 DIAGNOSIS — R5382 Chronic fatigue, unspecified: Secondary | ICD-10-CM | POA: Diagnosis not present

## 2023-10-26 DIAGNOSIS — R7989 Other specified abnormal findings of blood chemistry: Secondary | ICD-10-CM

## 2023-10-26 DIAGNOSIS — E782 Mixed hyperlipidemia: Secondary | ICD-10-CM

## 2023-10-26 DIAGNOSIS — I4819 Other persistent atrial fibrillation: Secondary | ICD-10-CM

## 2023-10-26 DIAGNOSIS — I10 Essential (primary) hypertension: Secondary | ICD-10-CM | POA: Diagnosis not present

## 2023-10-26 MED ORDER — FUROSEMIDE 20 MG PO TABS: ORAL_TABLET | ORAL | 3 refills | Status: DC

## 2023-10-26 NOTE — Patient Outreach (Signed)
Care Management  Transitions of Care Program Transitions of Care Post-discharge week 2   10/26/2023 Name: [name] [name] MRN: [id number] DOB: 1932-08-19  Subjective: [name] [name] is a 89 y.o. year old female who is a primary care patient of Doreene Nest, NP. The Care Management team Engaged with patient Engaged with patient by telephone to assess and address transitions of care needs.   Consent to Services:  Patient was given information about care management services, agreed to services, and gave verbal consent to participate.   Assessment: Outreach to the patient today for follow up after hospitalization for two syncope falls. The patient is scheduled for the cardiologist today for assessment. She states she is dissatisfied with her current provider and if she doesn't feel comfortable with him after today's visit she will look for a new provider. She states she is doing well. She will start driving again on Monday. She has wonderful family support and they check on her all the time. She uses her walker so that she doesn't fall. She is compliant with her medication. Confirmed follow up appointment for next week.   SDOH Interventions    Flowsheet Row Telephone from 10/26/2023 in [place] [facility] Telephone from 10/17/2023 in [place] [facility] Office Visit from 02/16/2023 in [facility] Clinical Support from 06/20/2020 in [facility] [place] [facility] Clinical Support from 04/27/2019 in [facility] [place]  SDOH Interventions       Food Insecurity Interventions Intervention Not Indicated Intervention Not Indicated -- -- --  Housing Interventions Intervention Not Indicated Intervention Not Indicated -- -- --  Transportation Interventions Intervention Not Indicated Intervention Not Indicated -- -- --  Utilities Interventions Intervention Not Indicated Intervention Not  Indicated -- -- --  Depression Interventions/Treatment  -- -- Counseling PHQ2-9 Score <4 Follow-up Not Indicated PHQ2-9 Score <4 Follow-up Not Indicated        Goals Addressed             This Visit's Progress    TOC 30 day program       Current Barriers:  Knowledge Deficits related to plan of care for management of fall related to syncope and collapse  Need to Medical Alert system : Reinforced November 27th  RNCM Clinical Goal(s):  Patient will work with the Care Management team over the next 30 days to address Transition of Care Barriers: Provider appointments take all medications exactly as prescribed and will call provider for medication related questions as evidenced by EMR attend all scheduled medical appointments: PCP and specialist as evidenced by EMR  through collaboration with RN Care manager, provider, and care team.   Interventions: Evaluation of current treatment plan related to  self management and patient's adherence to plan as established by provider Care guide schedule follow up appt with Melene Muller 10/26/2023   Falls Interventions:  (Status:  New goal.) Long Term Goal Provided written and verbal education re: potential causes of falls and Fall prevention strategies Assessed for signs and symptoms of orthostatic hypotension Provided patient information for fall alert systems RN sent the patient daughter an email with the copy of the medical alert system offered by [facility] volunteer dept  Patient Goals/Self-Care Activities: Participate in Transition of Care Program/Attend [facility] scheduled calls Notify RN Care Manager of [facility] call rescheduling needs Take all medications as prescribed Attend all scheduled provider appointments Call pharmacy for medication refills 3-7 days in advance of  running out of medications Call provider office for new concerns or questions    Follow Up Plan:  Telephone follow up appointment with care management team member scheduled  for:  12//02/2023 at 10am        Reviewed goals for care. Please refer to Care Plan for goals and interventions.  Effectiveness of interventions, symptom management and outcomes will be evaluated weekly. Patient educated on red flags s/s to watch for and was encouraged to report any of these identified, any new symptoms, changes in baseline or medication regimen, change in health status / well-being, or safety concerns to PCP and / or the [facility] Case Management team.   Routine follow-up and on-going assessment evaluation and education of disease processes, recommended interventions for both chronic and acute medical conditions, will occur during each weekly visit during [facility] 30-day Program Outreach calls along with ongoing review of symptoms, medication reviews and reconciliation. Any updates, inconsistencies, discrepancies or acute care concerns will be addressed on the Care Plan and routed to the correct Practitioner if indicated.    The patient has been provided with contact information for the care management team and has been advised to call with any health-related questions or concerns. The patient verbalized understanding with current POC. The patient is directed to their insurance card regarding availability of benefits coverage.   Deidre Ala, RN Medical illustrator [facility] [phone]

## 2023-10-26 NOTE — Progress Notes (Signed)
Chief Complaint  Patient presents with   Follow-up    CAD   History of Present Illness: 89 yo female with history of CAD, HLD, HTN, non-Hodgkin's lymphoma, DM and aortic valve insufficiency here today for cardiac follow up. In 2009 she had tandem non-overlapping drug-eluting stents placed in the LAD. Last cardiac cath April 2015 with patent LAD stents, occluded apical LAD segment with left to left collaterals and non-obstructive disease elsewhere. She has been intolerant to statins and Plavix in the past. Echo 01/20/16 with normal LV size and function, trivial aortic valve insufficiency. She has had bilateral foot pain and normal ABI in 2016. Last nuclear stress test October 2017 with no ischemia. She has stable angina controlled with Imdur.  Echo May 2021 with LVEF=60-65%. Mild AI. Echo May 2023 with LVEF=60-65%, mild LVH. Mild AI. She was seen in our office 12/17/22 and was doing well but described swelling in her right leg. Venous dopplers with right LE DVT. She was started on Eliquis. She was seen in our office 12/24/22 and reported dark black stools. She was sent to the ED and her Hgb was down to 10 from 11 on 12/20/22. Positive hemoccult stool card. Her Eliquis was lowered to 5 mg po BID. She was discharged home. She was referred to GI by her primary care provider. Hgb 9.8 on 12/29/22. She contacted our office on 01/24/23 and reported ongoing right leg pain. I saw her on 01/28/23 and she was doing well overall only c/o chronic right knee pain. She was admitted to [facility] on 10/09/23 following a syncopal event while standing up from the toilet. She had reported several days of epistaxis prior to admission. Troponin negative. During her hospital stay her Hgb dropped from 9.2 to 6.7. She was started on Iv Protonix and transfused 2 units of pRBCs. She underwent EGD and colonoscopy.  EGD revealed a normal esophagus, multiple gastric polyps which were biopsied.  Colonoscopy revealed nonbleeding internal hemorrhoids,  mild diverticulosis without acute diverticulitis or diverticular bleeding.  She had a 19 mm polyp in the proximal ascending colon which was removed, resected, retrieved. She was discharged home on 10/15/2023. Echo 10/11/23 with LVEF=60-65%. Trivial AI. Her syncope was felt to be due to her anemia and orthostasis from dehydration. She was found to be in atrial fibrillation during her hospital stay.   She is here today for follow up. The patient denies any chest pain, dyspnea, palpitations, orthopnea, PND, dizziness, near syncope or syncope. She is feeling better following discharge. Hgb stable at 8.9 on 10/20/23 in primary care. She stopped her Lasix several months ago and reports ongoing LE edema since then.   Primary Care Physician: Doreene Nest, NP  Past Medical History:  Diagnosis Date   Acute bilateral thoracic back pain 08/16/2019   Allergy    Cipro, Plavix, Statins   Anemia    Arthritis    Atrial fibrillation (HCC)    CAD (coronary artery disease)    a. s/p tandem Promus DES to LAD in 2009 by Dr. Juanda Chance;  b.  LHC (03/22/14):  prox LAD 30%, mid LAD 40%, LAD stent ok with dist 20% ISR, apical LAD occluded with L-L collats filling apical vessel (too small for PCI), mid RCA 20%, EF 70%.  Med Rx   Cancer [facility])    Chronic kidney disease, stage 3b (HCC)    Colon polyps    Diabetes mellitus    Diagnosed 2012   Diverticulosis of colon (without mention of hemorrhage)  DVT (deep venous thrombosis) (HCC) 12/2022   right leg   GERD (gastroesophageal reflux disease)    Glaucoma    Grade I diastolic dysfunction    Grade I diastolic dysfunction    Herpes zoster without complication 04/24/2020   Hyperlipidemia    intol of statins   Hypertension    Kidney stones    Left atrial dilation    Moderate aortic regurgitation    Motion sickness    back seat cars   NHL (non-Hodgkin's lymphoma) (HCC) dx'd 2003   chemo/xrt comp 2003   Personal history of colonic polyps    Proctitis     Pruritic intertrigo 07/18/2014   Urticaria    Wears dentures    Full upper, partial lower   Wears hearing aid in both ears     Past Surgical History:  Procedure Laterality Date   BIOPSY  10/15/2023   Procedure: BIOPSY;  Surgeon: Norma Fredrickson, Boykin Nearing, MD;  Location: [facility] ENDOSCOPY;  Service: Gastroenterology;;   cardiac cath-neg     CATARACT EXTRACTION Bilateral    COLONOSCOPY WITH PROPOFOL N/A 10/15/2023   Procedure: COLONOSCOPY WITH PROPOFOL;  Surgeon: Toledo, Boykin Nearing, MD;  Location: [facility] ENDOSCOPY;  Service: Gastroenterology;  Laterality: N/A;   CORONARY ANGIOPLASTY WITH STENT PLACEMENT     x 2   dexa-neg     ESOPHAGOGASTRODUODENOSCOPY (EGD) WITH PROPOFOL N/A 10/15/2023   Procedure: ESOPHAGOGASTRODUODENOSCOPY (EGD) WITH PROPOFOL;  Surgeon: Toledo, Boykin Nearing, MD;  Location: [facility] ENDOSCOPY;  Service: Gastroenterology;  Laterality: N/A;   HEMOSTASIS CLIP PLACEMENT  10/15/2023   Procedure: HEMOSTASIS CLIP PLACEMENT;  Surgeon: Norma Fredrickson, Boykin Nearing, MD;  Location: [facility] ENDOSCOPY;  Service: Gastroenterology;;   KNEE SURGERY  10/30/2003   left   LEFT HEART CATHETERIZATION WITH CORONARY ANGIOGRAM N/A 03/22/2014   Procedure: LEFT HEART CATHETERIZATION WITH CORONARY ANGIOGRAM;  Surgeon: Kathleene Hazel, MD;  Location: [facility] CATH LAB;  Service: Cardiovascular;  Laterality: N/A;   POLYPECTOMY  10/15/2023   Procedure: POLYPECTOMY;  Surgeon: Toledo, Boykin Nearing, MD;  Location: [facility] ENDOSCOPY;  Service: Gastroenterology;;   stress cardiolite     VAGINAL HYSTERECTOMY     partial, fibroids, one ovary left    Current Outpatient Medications  Medication Sig Dispense Refill   Bempedoic Acid-Ezetimibe (NEXLIZET) 180-10 MG TABS Take 1 tablet by mouth daily. 90 tablet 3   Blood Glucose Monitoring Suppl DEVI 1 each by Does not apply route in the morning, at noon, and at bedtime. May substitute to any manufacturer covered by patient's insurance. 1 each 0   brimonidine (ALPHAGAN) 0.2 % ophthalmic solution  Place 1 drop into the right eye in the morning and at bedtime.     Cholecalciferol (VITAMIN D-3) 1000 UNITS CAPS Take 1 capsule by mouth daily.     CRANBERRY PO Take 1 tablet by mouth daily.     diclofenac Sodium (VOLTAREN) 1 % GEL APPLY 2 GRAMS TOPICALLY THREE TIMES A DAY AS NEEDED 100 g 2   Dulaglutide (TRULICITY) 0.75 MG/0.5ML SOPN Inject 0.75 mg into the skin once a week. for diabetes. 6 mL 1   ELIQUIS 5 MG TABS tablet TAKE 1 TABLET TWICE DAILY 180 tablet 3   ferrous sulfate 325 (65 FE) MG tablet Take 1 tablet (325 mg total) by mouth daily with breakfast. 30 tablet 2   fexofenadine (ALLEGRA) 180 MG tablet Take 1 tablet (180 mg total) by mouth as needed for allergies or rhinitis. 90 tablet 0   gabapentin (NEURONTIN) 100 MG capsule Take 1 capsule (100  mg total) by mouth 3 (three) times daily. 90 capsule 3   glipiZIDE (GLUCOTROL XL) 10 MG 24 hr tablet Take 1 tablet (10 mg total) by mouth daily with breakfast. for diabetes. 90 tablet 1   Glucose Blood (BLOOD GLUCOSE TEST STRIPS) STRP 1 each by In Vitro route in the morning, at noon, and at bedtime. May substitute to any manufacturer covered by patient's insurance. 300 strip 5   isosorbide mononitrate (IMDUR) 60 MG 24 hr tablet Take 1.5 tablets (90 mg total) by mouth daily. 135 tablet 1   Lancets Misc. MISC 1 each by Does not apply route in the morning, at noon, and at bedtime. May substitute to any manufacturer covered by patient's insurance. 300 each 5   metoprolol succinate (TOPROL-XL) 100 MG 24 hr tablet Take one (1) tablet by mouth (100 mg) daily. Take with or immediately following a meal. 90 tablet 3   nitroGLYCERIN (NITROSTAT) 0.4 MG SL tablet Place 1 tablet (0.4 mg total) under the tongue every 5 (five) minutes as needed for chest pain. 75 tablet 2   pantoprazole (PROTONIX) 20 MG tablet TAKE 1 TABLET BY MOUTH DAILY FOR HEARTBURN 90 tablet 2   sertraline (ZOLOFT) 100 MG tablet TAKE 1 TABLET BY MOUTH DAILY FOR ANXIETY AND DEPRESSION 90 tablet 2    vitamin B-12 (CYANOCOBALAMIN) 1000 MCG tablet Take 1,000 mcg by mouth daily.     furosemide (LASIX) 20 MG tablet Take 1 tablet by mouth daily. May take additional tablet as needed for swelling 90 tablet 3   ROCKLATAN 0.02-0.005 % SOLN Place 1 drop into the right eye at bedtime. (Patient not taking: Reported on 10/26/2023)     triamcinolone cream (KENALOG) 0.1 % Apply 1 Application topically 2 (two) times daily. (Patient not taking: Reported on 10/26/2023) 30 g 1   No current facility-administered medications for this visit.    Allergies  Allergen Reactions   Other Rash and Anaphylaxis   Shellfish Allergy Anaphylaxis and Rash   Cephalexin     REACTION: trash and swelling   Ciprofloxacin     REACTION: u/k   Clopidogrel Bisulfate     REACTION: swelling, rash   Ezetimibe-Simvastatin     REACTION: myalgias   Lidocaine Hives    ONLY TO ADHESIVE LIDOCAINE PATCHES. NO PROBLEM WITH INJECTABLE LIDOCAINE.   Nitrofurantoin     REACTION: [place]   Pregabalin     REACTION: felt bad   Celecoxib Rash    REACTION: [place]   Codeine Rash   Codeine Phosphate Rash   Hydrocod Poli-Chlorphe Poli Er Rash   Propoxyphene Rash   Propoxyphene N-Acetaminophen Rash   Rofecoxib Rash   Sulfa Antibiotics Rash   Sulfamethoxazole Rash   Sulfonamide Derivatives Rash    Social History   Socioeconomic History   Marital status: Widowed    Spouse name: Not on file   Number of children: 0   Years of education: Not on file   Highest education level: 12th grade  Occupational History   Occupation: Retired    Associate Professor: RETIRED   Occupation: retired  Tobacco Use   Smoking status: Never   Smokeless tobacco: Never  Vaping Use   Vaping status: Never Used  Substance and Sexual Activity   Alcohol use: No   Drug use: No   Sexual activity: Not Currently  Other Topics Concern   Not on file  Social History Narrative   Not on file   Social Determinants of Health   Financial Resource Strain: Low Risk   (  09/29/2023)   Overall Financial Resource Strain (CARDIA)    Difficulty of Paying Living Expenses: Not hard at all  Food Insecurity: No Food Insecurity (10/26/2023)   Hunger Vital Sign    Worried About Running Out of Food in the Last Year: Never true    Ran Out of Food in the Last Year: Never true  Transportation Needs: No Transportation Needs (10/26/2023)   PRAPARE - Administrator, Civil Service (Medical): No    Lack of Transportation (Non-Medical): No  Physical Activity: Unknown (09/29/2023)   Exercise Vital Sign    Days of Exercise per Week: 0 days    Minutes of Exercise per Session: Not on file  Stress: No Stress Concern Present (09/29/2023)   [employer] of Occupational Health - Occupational Stress Questionnaire    Feeling of Stress : Not at all  Social Connections: Moderately Integrated (09/29/2023)   Social Connection and Isolation Panel [NHANES]    Frequency of Communication with Friends and Family: More than three times a week    Frequency of Social Gatherings with Friends and Family: More than three times a week    Attends Religious Services: More than 4 times per year    Active Member of [employer] or Organizations: Yes    Attends Banker Meetings: More than 4 times per year    Marital Status: Widowed  Intimate Partner Violence: Not At Risk (10/26/2023)   Humiliation, Afraid, Rape, and Kick questionnaire    Fear of Current or Ex-Partner: No    Emotionally Abused: No    Physically Abused: No    Sexually Abused: No    Family History  Problem Relation Age of Onset   Cancer Mother        jaw   Hypertension Father    Heart attack Father    Heart attack Sister    Cancer Sister        unknown primary-mets throughout body   Colon cancer Neg Hx    Esophageal cancer Neg Hx    Pancreatic cancer Neg Hx    Stomach cancer Neg Hx    Liver disease Neg Hx     Review of Systems:  As stated in the HPI and otherwise negative.   BP 136/72   Pulse  94   Ht 5\' 9"  (1.753 m)   Wt 78.3 kg   SpO2 97%   BMI 25.49 kg/m   Physical Examination: General: Well developed, well nourished, NAD  HEENT: OP clear, mucus membranes moist  SKIN: warm, dry. No rashes. Neuro: No focal deficits  Musculoskeletal: Muscle strength 5/5 all ext  Psychiatric: Mood and affect normal  Neck: No JVD, no carotid bruits, no thyromegaly, no lymphadenopathy.  Lungs:Clear bilaterally, no wheezes, rhonci, crackles Cardiovascular: Regular rate and rhythm. No murmurs, gallops or rubs. Abdomen:Soft. Bowel sounds present. Non-tender.  Extremities: Trace bilateral lower extremity edema. Pulses are 2 + in the bilateral DP/PT.  EKG:  EKG is not ordered today. The ekg ordered today demonstrates   Recent Labs: 10/09/2023: B Natriuretic Peptide 363.8; TSH 1.483 10/10/2023: ALT 8 10/13/2023: BUN 27; Creatinine, Ser 1.28; Potassium 3.7; Sodium 140 10/20/2023: Hemoglobin 8.9 Repeated and verified X2.; Platelets 387.0   Lipid Panel    Component Value Date/Time   CHOL 111 08/24/2023 1036   CHOL 123 02/01/2022 0853   TRIG (H) 08/24/2023 1036    452.0 Triglyceride is over 400; calculations on Lipids are invalid.   HDL 33.80 (L) 08/24/2023 1036   HDL 33 (  L) 02/01/2022 0853   CHOLHDL 3 08/24/2023 1036   VLDL 34.6 07/02/2021 1121   LDLCALC 54 02/01/2022 0853   LDLDIRECT 35.0 08/24/2023 1036     Wt Readings from Last 3 Encounters:  10/26/23 78.3 kg  10/20/23 76.7 kg  10/15/23 75 kg    Assessment and Plan:   1. CAD with stable angina: Chronic chest pain without change. Continue Imdur and  Toprol. She is statin intolerant. She is now on bempedoic acid. LDL at goal. ASA stopped when she was started on Eliquis.   2. Hyperlipidemia: LDL 35 in September 2024. Continue Bempedoic acid. She is statin intolerant.   3. HTN: BP is well controlled. No changes.   4. Aortic insufficiency: Trivial by echo November 2024.     5. GI bleeding/anemia: See above regarding recent GI  workup.    6. Atrial fibrillation, persistent: New finding during hospitalization November 2024. Rate is controlled today on Toprol. She is on Eliquis.   7. Chronic diastolic CHF: Mild LE edema. She does not like taking Lasix and stopped it recently. Will have her resume Lasix 20 mg daily.   Labs/ tests ordered today include:   No orders of the defined types were placed in this encounter.  Disposition:   F/U with me in 6 months  Signed, Verne Carrow, MD 10/26/2023 4:55 PM    [facility] [address] [place], [place], [place]  [address] Phone: [phone]; Fax: [phone]

## 2023-10-26 NOTE — Patient Instructions (Signed)
Visit Information  Thank you for taking time to visit with me today. Please don't hesitate to contact me if I can be of assistance to you before our next scheduled telephone appointment.  Following is a copy of your care plan:   Goals Addressed             This Visit's Progress    TOC 30 day program       Current Barriers:  Knowledge Deficits related to plan of care for management of fall related to syncope and collapse  Need to Medical Alert system : Reinforced November 27th  RNCM Clinical Goal(s):  Patient will work with the Care Management team over the next 30 days to address Transition of Care Barriers: Provider appointments take all medications exactly as prescribed and will call provider for medication related questions as evidenced by EMR attend all scheduled medical appointments: PCP and specialist as evidenced by EMR  through collaboration with RN Care manager, provider, and care team.   Interventions: Evaluation of current treatment plan related to  self management and patient's adherence to plan as established by provider Care guide schedule follow up appt with Melene Muller 10/26/2023   Falls Interventions:  (Status:  New goal.) Long Term Goal Provided written and verbal education re: potential causes of falls and Fall prevention strategies Assessed for signs and symptoms of orthostatic hypotension Provided patient information for fall alert systems RN sent the patient daughter an email with the copy of the medical alert system offered by [facility] volunteer dept  Patient Goals/Self-Care Activities: Participate in Transition of Care Program/Attend [facility] scheduled calls Notify RN Care Manager of TOC call rescheduling needs Take all medications as prescribed Attend all scheduled provider appointments Call pharmacy for medication refills 3-7 days in advance of running out of medications Call provider office for new concerns or questions    Follow Up Plan:  Telephone  follow up appointment with care management team member scheduled for:  12//02/2023 at 10am        Please follow through with recommendations for Medical Alert system  Patient verbalizes understanding of instructions and care plan provided today and agrees to view in MyChart. Active MyChart status and patient understanding of how to access instructions and care plan via MyChart confirmed with patient.     Telephone follow up appointment with care management team member scheduled for: Wednesday December 4th at 10am Please call the care guide team at [phone] if you need to cancel or reschedule your appointment.   Please call the Suicide and Crisis Lifeline: 988 call the [place] National Suicide Prevention Lifeline: 463-745-8507 or TTY: 717-820-5427 TTY 337-210-8195) to talk to a trained counselor if you are experiencing a Mental Health or Behavioral Health Crisis or need someone to talk to.  Deidre Ala, RN Medical illustrator [facility] [phone]

## 2023-10-26 NOTE — Patient Instructions (Signed)
Medication Instructions:  RESUME Furosemide 20mg  daily. May take extra tablet (40mg ) as needed for swelling *If you need a refill on your cardiac medications before your next appointment, please call your pharmacy*  Follow-Up: At [facility], you and your health needs are our priority.  As part of our continuing mission to provide you with exceptional heart care, we have created designated Provider Care Teams.  These Care Teams include your primary Cardiologist (physician) and Advanced Practice Providers (APPs -  Physician Assistants and Nurse Practitioners) who all work together to provide you with the care you need, when you need it.   Your next appointment:   As Scheduled  Provider:   Verne Carrow, MD

## 2023-10-26 NOTE — Patient Outreach (Signed)
  Care Management  Transitions of Care Program Transitions of Care Post-discharge week 2  10/26/2023 Name: [name] MRN: [id number] DOB: 05/13/1932  Subjective: [name] [name] is a 89 y.o. year old female who is a primary care patient of Doreene Nest, NP. The Care Management team was unable to reach the patient by phone to assess and address transitions of care needs.   Plan: Additional outreach attempts will be made to reach the patient enrolled in the [facility] Program (Post Inpatient/ED Visit).  Deidre Ala, RN Medical illustrator [facility]Population [facility] [phone]

## 2023-10-30 NOTE — Progress Notes (Unsigned)
    [name] Hendricksen T. [name] Krach, MD, CAQ [facility] at [facility] [address] [place], [address]  Phone: [phone]  FAX: [phone]  [name] [name] - 89 y.o. female  MRN [id number]  Date of Birth: 1932-01-26  Date: 10/31/2023  PCP: Doreene Nest, NP  Referral: Doreene Nest, NP  No chief complaint on file.  Subjective:   [name] [name] is a 89 y.o. very pleasant female patient with There is no height or weight on file to calculate BMI. who presents with the following:  The patient is seen today with some ongoing right sided chronic knee pain.  She does have advanced knee osteoarthritis on the right side with bone-on-bone pathology.  I did do an intra-articular injection of her right knee in August 2024.  She also presents with some ongoing chronic ankle pain.    Review of Systems is noted in the HPI, as appropriate  Objective:   There were no vitals taken for this visit.  GEN: No acute distress; alert,appropriate. PULM: Breathing comfortably in no respiratory distress PSYCH: Normally interactive.   Laboratory and Imaging Data:  Assessment and Plan:   ***

## 2023-10-31 ENCOUNTER — Ambulatory Visit: Payer: Medicare PPO | Admitting: Family Medicine

## 2023-10-31 ENCOUNTER — Encounter: Payer: Self-pay | Admitting: Family Medicine

## 2023-10-31 VITALS — BP 120/70 | HR 77 | Temp 97.6°F | Ht 69.0 in | Wt 172.5 lb

## 2023-10-31 DIAGNOSIS — M1711 Unilateral primary osteoarthritis, right knee: Secondary | ICD-10-CM | POA: Diagnosis not present

## 2023-10-31 DIAGNOSIS — Z7984 Long term (current) use of oral hypoglycemic drugs: Secondary | ICD-10-CM

## 2023-10-31 DIAGNOSIS — E1142 Type 2 diabetes mellitus with diabetic polyneuropathy: Secondary | ICD-10-CM

## 2023-10-31 MED ORDER — TRIAMCINOLONE ACETONIDE 40 MG/ML IJ SUSP
40.0000 mg | Freq: Once | INTRAMUSCULAR | Status: AC
  Administered 2023-10-31: 40 mg via INTRA_ARTICULAR

## 2023-11-02 ENCOUNTER — Other Ambulatory Visit: Payer: Self-pay | Admitting: Primary Care

## 2023-11-02 ENCOUNTER — Other Ambulatory Visit: Payer: Self-pay

## 2023-11-02 NOTE — Patient Instructions (Signed)
Visit Information  Thank you for taking time to visit with me today. Please don't hesitate to contact me if I can be of assistance to you before our next scheduled telephone appointment.  Following is a copy of your care plan:   Goals Addressed             This Visit's Progress    COMPLETED: TOC 30 day program       Current Barriers:  Knowledge Deficits related to plan of care for management of fall related to syncope and collapse  Need to Medical Alert system : Reinforced November 27th  RNCM Clinical Goal(s):  Patient will work with the Care Management team over the next 30 days to address Transition of Care Barriers: Provider appointments take all medications exactly as prescribed and will call provider for medication related questions as evidenced by EMR attend all scheduled medical appointments: PCP and specialist as evidenced by EMR  through collaboration with RN Care manager, provider, and care team.   Interventions: Evaluation of current treatment plan related to  self management and patient's adherence to plan as established by provider Care guide schedule follow up appt with Melene Muller 10/26/2023   Falls Interventions:  (Status:  New goal.) Long Term Goal Provided written and verbal education re: potential causes of falls and Fall prevention strategies Assessed for signs and symptoms of orthostatic hypotension Provided patient information for fall alert systems RN sent the patient daughter an email with the copy of the medical alert system offered by [facility] volunteer dept  Patient Goals/Self-Care Activities: Participate in Transition of Care Program/Attend [facility] scheduled calls Notify RN Care Manager of TOC call rescheduling needs Take all medications as prescribed Attend all scheduled provider appointments Call pharmacy for medication refills 3-7 days in advance of running out of medications Call provider office for new concerns or questions    Follow Up Plan:   The patient states she is medically stable and does not require further follow up.          Patient verbalizes understanding of instructions and care plan provided today and agrees to view in MyChart. Active MyChart status and patient understanding of how to access instructions and care plan via MyChart confirmed with patient.     Please call the care guide team at [phone] if you need to cancel or reschedule your appointment.   Please call the Suicide and Crisis Lifeline: 988 call the [place] National Suicide Prevention Lifeline: 343-593-0536 or TTY: 7786222025 TTY 450-588-1210) to talk to a trained counselor if you are experiencing a Mental Health or Behavioral Health Crisis or need someone to talk to. Deidre Ala, RN Medical illustrator [facility]Population [facility] [phone]

## 2023-11-02 NOTE — Patient Outreach (Signed)
Care Management  Transitions of Care Program Transitions of Care Post-discharge week 3   11/02/2023 Name: [name] [name] MRN: [id number] DOB: 06-09-1932  Subjective: [name] [name] is a 89 y.o. year old female who is a primary care patient of Doreene Nest, NP. The Care Management team Engaged with patient Engaged with patient by telephone to assess and address transitions of care needs.   Consent to Services:  Patient was given information about care management services, agreed to services, and gave verbal consent to participate.   Assessment: Outreach completed to the patient today. She had a follow up PCP visit and a follow up Cardiology visit since last contact. The outcome of the Cardiology visit shows medical stability and she doesn't need to follow up for 6 months. She is going to change to a provider that is closer to her home. Her PCP injected Kenolog into her knee due to significant pain. She states her pain was making walking difficult and she is better today. She feels that she is stable and does not require further Outreach. She declines referral to CCM.    SDOH Interventions    Flowsheet Row Telephone from 10/26/2023 in [place] [facility] Telephone from 10/17/2023 in [place] [facility]PARTMENT Office Visit from 02/16/2023 in [facility] Clinical Support from 06/20/2020 in [facility] Clinical Support from 04/27/2019 in [facility] [place]  SDOH Interventions       Food Insecurity Interventions Intervention Not Indicated Intervention Not Indicated -- -- --  Housing Interventions Intervention Not Indicated Intervention Not Indicated -- -- --  Transportation Interventions Intervention Not Indicated Intervention Not Indicated -- -- --  Utilities Interventions Intervention Not Indicated Intervention Not Indicated -- -- --  Depression  Interventions/Treatment  -- -- Counseling PHQ2-9 Score <4 Follow-up Not Indicated PHQ2-9 Score <4 Follow-up Not Indicated        Goals Addressed             This Visit's Progress    COMPLETED: TOC 30 day program       Current Barriers:  Knowledge Deficits related to plan of care for management of fall related to syncope and collapse  Need to Medical Alert system : Reinforced November 27th  RNCM Clinical Goal(s):  Patient will work with the Care Management team over the next 30 days to address Transition of Care Barriers: Provider appointments take all medications exactly as prescribed and will call provider for medication related questions as evidenced by EMR attend all scheduled medical appointments: PCP and specialist as evidenced by EMR  through collaboration with RN Care manager, provider, and care team.   Interventions: Evaluation of current treatment plan related to  self management and patient's adherence to plan as established by provider Care guide schedule follow up appt with Melene Muller 10/26/2023   Falls Interventions:  (Status:  New goal.) Long Term Goal Provided written and verbal education re: potential causes of falls and Fall prevention strategies Assessed for signs and symptoms of orthostatic hypotension Provided patient information for fall alert systems RN sent the patient daughter an email with the copy of the medical alert system offered by [facility] volunteer dept  Patient Goals/Self-Care Activities: Participate in Transition of Care Program/Attend [facility] scheduled calls Notify RN Care Manager of [facility] call rescheduling needs Take all medications as prescribed Attend all scheduled provider appointments Call pharmacy for medication refills 3-7 days in advance of running  out of medications Call provider office for new concerns or questions    Follow Up Plan:  The patient states she is medically stable and does not require further follow up.          The patient has been provided with contact information for the care management team and has been advised to call with any health-related questions or concerns. The patient verbalized understanding with current POC. The patient is directed to their insurance card regarding availability of benefits coverage.   Deidre Ala, RN Medical illustrator [facility] [phone]

## 2023-11-03 ENCOUNTER — Encounter: Payer: Self-pay | Admitting: Cardiovascular Disease

## 2023-11-03 ENCOUNTER — Other Ambulatory Visit (INDEPENDENT_AMBULATORY_CARE_PROVIDER_SITE_OTHER): Payer: Medicare PPO

## 2023-11-03 DIAGNOSIS — D649 Anemia, unspecified: Secondary | ICD-10-CM | POA: Diagnosis not present

## 2023-11-03 LAB — CBC
HCT: 30.4 % — ABNORMAL LOW (ref 36.0–46.0)
Hemoglobin: 10.2 g/dL — ABNORMAL LOW (ref 12.0–15.0)
MCHC: 33.6 g/dL (ref 30.0–36.0)
MCV: 99.5 fL (ref 78.0–100.0)
Platelets: 305 10*3/uL (ref 150.0–400.0)
RBC: 3.06 Mil/uL — ABNORMAL LOW (ref 3.87–5.11)
RDW: 18.9 % — ABNORMAL HIGH (ref 11.5–15.5)
WBC: 5.2 10*3/uL (ref 4.0–10.5)

## 2023-11-03 LAB — IBC + FERRITIN
Ferritin: 36.9 ng/mL (ref 10.0–291.0)
Iron: 339 ug/dL — ABNORMAL HIGH (ref 42–145)
Saturation Ratios: 90 % — ABNORMAL HIGH (ref 20.0–50.0)
TIBC: 376.6 ug/dL (ref 250.0–450.0)
Transferrin: 269 mg/dL (ref 212.0–360.0)

## 2023-11-04 ENCOUNTER — Other Ambulatory Visit: Payer: Self-pay | Admitting: Primary Care

## 2023-11-04 DIAGNOSIS — D649 Anemia, unspecified: Secondary | ICD-10-CM

## 2023-11-10 ENCOUNTER — Other Ambulatory Visit: Payer: Self-pay

## 2023-11-10 ENCOUNTER — Ambulatory Visit
Admission: EM | Admit: 2023-11-10 | Discharge: 2023-11-10 | Disposition: A | Discharge status: Home / Self Care | Payer: Medicare PPO | Attending: Emergency Medicine | Admitting: Emergency Medicine

## 2023-11-10 ENCOUNTER — Emergency Department
Admission: EM | Admit: 2023-11-10 | Discharge: 2023-11-10 | Disposition: A | Discharge status: Home / Self Care | Payer: Medicare PPO | Attending: Emergency Medicine | Admitting: Emergency Medicine

## 2023-11-10 DIAGNOSIS — Z7901 Long term (current) use of anticoagulants: Secondary | ICD-10-CM | POA: Diagnosis not present

## 2023-11-10 DIAGNOSIS — R040 Epistaxis: Secondary | ICD-10-CM

## 2023-11-10 DIAGNOSIS — Z7984 Long term (current) use of oral hypoglycemic drugs: Secondary | ICD-10-CM | POA: Insufficient documentation

## 2023-11-10 DIAGNOSIS — Z794 Long term (current) use of insulin: Secondary | ICD-10-CM | POA: Diagnosis not present

## 2023-11-10 DIAGNOSIS — E119 Type 2 diabetes mellitus without complications: Secondary | ICD-10-CM | POA: Diagnosis not present

## 2023-11-10 MED ORDER — TRANEXAMIC ACID FOR EPISTAXIS
500.0000 mg | Freq: Once | TOPICAL | Status: AC
  Administered 2023-11-10: 500 mg via TOPICAL
  Filled 2023-11-10 (×2): qty 10

## 2023-11-10 MED ORDER — SILVER NITRATE-POT NITRATE 75-25 % EX MISC
1.0000 | Freq: Once | CUTANEOUS | Status: AC
Start: 2023-11-10 — End: 2023-11-10
  Administered 2023-11-10: 1 via TOPICAL
  Filled 2023-11-10: qty 10

## 2023-11-10 MED ORDER — OXYMETAZOLINE HCL 0.05 % NA SOLN: 1.0000 | Freq: Two times a day (BID) | NASAL | Status: DC

## 2023-11-10 MED ORDER — LIDOCAINE-PRILOCAINE 2.5-2.5 % EX CREA
TOPICAL_CREAM | Freq: Once | CUTANEOUS | Status: AC
  Administered 2023-11-10: 1 via TOPICAL
  Filled 2023-11-10: qty 5

## 2023-11-10 NOTE — ED Triage Notes (Signed)
Pt states she started getting a nose bleed today at 1:30 and can not get it to stop. Pt  says she is on elquis. Pt states she had a few nose bleeds recently but only took 30 minuets to stop.

## 2023-11-10 NOTE — ED Triage Notes (Signed)
Pt arrives with family for a nose bleed. Pt sts that she is on eliquis. Pt has tried afrin at home with no successes. RN placed guaze in pt left nare and placed a nose clip on pt nose. Bleeding has stopped at this time.

## 2023-11-10 NOTE — Discharge Instructions (Addendum)
Please use oxymetazoline/nasal spray with galls and nasal clamp if bleeding reoccurs.  If bleeding persist after 20 minutes of pressure either return to the ER or follow-up with ENT.  Return to the ER for any worsening symptoms or any urgent changes in your health

## 2023-11-10 NOTE — ED Provider Notes (Signed)
[place] EMERGENCY DEPARTMENT AT [facility] REGION[facility] Provider Note   CSN: [id number] Arrival date & time: 11/10/23  1618     History  Chief Complaint  Patient presents with   Epistaxis    COTI MANDERFELD is a 89 y.o. female.  With history of DVT, A-fib who is on Eliquis presents with nasal bleed that began earlier today.  Nasal bleed has been only present on the left side.  She has had little relief with oxymetazoline nasal spray and compression.  She denies any trauma or injury.  No dizziness or lightheadedness.  She has been ambulatory.  No other bleeding throughout the body  HPI     Home Medications Prior to Admission medications   Medication Sig Start Date End Date Taking? Authorizing Provider  Bempedoic Acid-Ezetimibe (NEXLIZET) 180-10 MG TABS Take 1 tablet by mouth daily. 04/13/23   Kathleene Hazel, MD  Blood Glucose Monitoring Suppl DEVI 1 each by Does not apply route in the morning, at noon, and at bedtime. May substitute to any manufacturer covered by patient's insurance. 02/16/23   Doreene Nest, NP  brimonidine (ALPHAGAN) 0.2 % ophthalmic solution Place 1 drop into the right eye in the morning and at bedtime.    [provider]  Cholecalciferol (VITAMIN D-3) 1000 UNITS CAPS Take 1 capsule by mouth daily.    [provider]  CRANBERRY PO Take 1 tablet by mouth daily.    [provider]  diclofenac Sodium (VOLTAREN) 1 % GEL APPLY 2 GRAMS TOPICALLY THREE TIMES A DAY AS NEEDED 02/05/23   Doreene Nest, NP  Dulaglutide (TRULICITY) 0.75 MG/0.5ML SOPN Inject 0.75 mg into the skin once a week. for diabetes. 08/25/23   Doreene Nest, NP  ELIQUIS 5 MG TABS tablet TAKE 1 TABLET TWICE DAILY 06/28/23   Kathleene Hazel, MD  ferrous sulfate 325 (65 FE) MG tablet Take 1 tablet (325 mg total) by mouth daily with breakfast. 10/28/22   Meredith Pel, NP  fexofenadine (ALLEGRA) 180 MG tablet Take 1 tablet (180 mg total) by mouth as  needed for allergies or rhinitis. 04/03/19   Doreene Nest, NP  furosemide (LASIX) 20 MG tablet Take 1 tablet by mouth daily. May take additional tablet as needed for swelling 10/26/23   Kathleene Hazel, MD  gabapentin (NEURONTIN) 100 MG capsule Take 1 capsule (100 mg total) by mouth 3 (three) times daily. 05/11/23   Felecia Shelling, DPM  glipiZIDE (GLUCOTROL XL) 10 MG 24 hr tablet Take 1 tablet (10 mg total) by mouth daily with breakfast. for diabetes. 04/13/23   Doreene Nest, NP  Glucose Blood (BLOOD GLUCOSE TEST STRIPS) STRP 1 each by In Vitro route in the morning, at noon, and at bedtime. May substitute to any manufacturer covered by patient's insurance. 02/16/23   Doreene Nest, NP  isosorbide mononitrate (IMDUR) 60 MG 24 hr tablet Take 1.5 tablets (90 mg total) by mouth daily. 08/24/23   Kathleene Hazel, MD  Lancets Misc. MISC 1 each by Does not apply route in the morning, at noon, and at bedtime. May substitute to any manufacturer covered by patient's insurance. 02/16/23   Doreene Nest, NP  metoprolol succinate (TOPROL-XL) 100 MG 24 hr tablet Take one (1) tablet by mouth (100 mg) daily. Take with or immediately following a meal. 04/19/23   Kathleene Hazel, MD  nitroGLYCERIN (NITROSTAT) 0.4 MG SL tablet Place 1 tablet (0.4 mg total) under the tongue every 5 (  five) minutes as needed for chest pain. 04/21/23   Kathleene Hazel, MD  pantoprazole (PROTONIX) 20 MG tablet TAKE 1 TABLET BY MOUTH DAILY FOR HEARTBURN 09/14/23   Doreene Nest, NP  ROCKLATAN 0.02-0.005 % SOLN Place 1 drop into the right eye at bedtime. 04/16/23   [provider]  sertraline (ZOLOFT) 100 MG tablet TAKE 1 TABLET BY MOUTH DAILY FOR ANXIETY AND DEPRESSION 10/09/23   Doreene Nest, NP  triamcinolone cream (KENALOG) 0.1 % Apply 1 Application topically 2 (two) times daily. 08/19/23   Felecia Shelling, DPM  vitamin B-12 (CYANOCOBALAMIN) 1000 MCG tablet Take 1,000 mcg by mouth  daily.    [provider]  loratadine (CLARITIN) 10 MG tablet Take 10 mg by mouth as needed.   02/15/12  [provider]      Allergies    Other, Shellfish allergy, Cephalexin, Ciprofloxacin, Clopidogrel bisulfate, Ezetimibe-simvastatin, Lidocaine, Nitrofurantoin, Pregabalin, Celecoxib, Codeine, Codeine phosphate, Hydrocod poli-chlorphe poli er, Propoxyphene, Propoxyphene n-acetaminophen, Rofecoxib, Sulfa antibiotics, Sulfamethoxazole, and Sulfonamide derivatives    Review of Systems   Review of Systems  Physical Exam Updated Vital Signs BP (!) 150/98 (BP Location: Left Arm)   Pulse (!) 107   Temp 97.6 F (36.4 C) (Axillary)   Resp 12   Ht 5\' 9"  (1.753 m)   Wt 76.7 kg   SpO2 97%   BMI 24.96 kg/m  Physical Exam Constitutional:      Appearance: She is well-developed.  HENT:     Head: Normocephalic and atraumatic.     Nose:     Comments: Left nostril with slow trickle bleeding coming from the nasal septum there is a slow bleed along the nasal septum that is visible via nasal speculum.  This is anesthetized with lidocaine topically and was then cauterized with silver nitrate stick.  After 30 minutes no bleeding noted on exam. Eyes:     Conjunctiva/sclera: Conjunctivae normal.  Cardiovascular:     Rate and Rhythm: Normal rate.  Pulmonary:     Effort: Pulmonary effort is normal. No respiratory distress.  Musculoskeletal:        General: Normal range of motion.     Cervical back: Normal range of motion.  Skin:    General: Skin is warm.     Findings: No rash.  Neurological:     Mental Status: She is alert and oriented to person, place, and time.  Psychiatric:        Behavior: Behavior normal.        Thought Content: Thought content normal.     ED Results / Procedures / Treatments   Labs (all labs ordered are listed, but only abnormal results are displayed) Labs Reviewed - No data to display  EKG None  Radiology No results  found.  Procedures Procedures    Medications Ordered in ED Medications  tranexamic acid (CYKLOKAPRON) 1000 MG/10ML topical solution 500 mg (500 mg Topical Given by Other 11/10/23 1704)  silver nitrate applicators applicator 1 Application (1 Application Topical Given by Other 11/10/23 1920)  lidocaine-prilocaine (EMLA) cream (1 Application Topical Given by Other 11/10/23 1920)    ED Course/ Medical Decision Making/ A&P                                 Medical Decision Making Risk Prescription drug management.   89 year old female who is on Eliquis presents with left-sided epistasis.  Epistasis resolved with topical anesthetic lidocaine  followed by silver nitrate.  Patient was watched for 30 minutes after cauterization and there is no bleeding.  Patient was given strict return precautions and information to follow-up with ENT if symptoms persist.  Patient's vital signs are stable, she is asymptomatic. Final Clinical Impression(s) / ED Diagnoses Final diagnoses:  Anterior epistaxis  Left-sided epistaxis    Rx / DC Orders ED Discharge Orders     None         Ronnette Juniper 11/10/23 2034    Minna Antis, MD 11/20/23 [phone]

## 2023-11-10 NOTE — ED Provider Notes (Signed)
Patient complains of a nosebleed that began around 1:30 pm today, as she is oxymetazoline nasal spray without relief.  Patient states she is currently taking Eliquis though has experienced nosebleeds in the past.  Patient advised she will need to go to the emergency room for further evaluation and treatment of uncontrolled nosebleed.  Patient agrees to go now.  Patient accompanied by her daughter who agrees to take her there by private vehicle.   Theadora Rama Scales, PA-C 11/10/23 [phone]

## 2023-11-10 NOTE — ED Notes (Signed)
Patient is being discharged from the Urgent Care and sent to the Emergency Department via PMV . Per Provider Huston Foley., patient is in need of higher level of care due to nose bleed. Patient is aware and verbalizes understanding of plan of care.  Vitals:   11/10/23 1552  BP: (!) 170/82  Pulse: 95  Resp: 16  Temp: 98.4 F (36.9 C)  SpO2: 94%

## 2023-11-10 NOTE — ED Notes (Signed)
ED Provider at bedside. 

## 2023-11-11 ENCOUNTER — Telehealth: Payer: Self-pay | Admitting: Cardiovascular Disease

## 2023-11-11 NOTE — Telephone Encounter (Signed)
Pt c/o medication issue:  1. Name of Medication:   Bempedoic Acid-Ezetimibe (NEXLIZET) 180-10 MG TABS   2. How are you currently taking this medication (dosage and times per day)?   As prescribed  3. Are you having a reaction (difficulty breathing--STAT)?   No  4. What is your medication issue?    Patient wants a call back from the pharmacist to discuss this medication.

## 2023-11-14 NOTE — Telephone Encounter (Signed)
No answer, LMOM

## 2023-11-15 ENCOUNTER — Telehealth: Payer: Self-pay | Admitting: Primary Care

## 2023-11-15 ENCOUNTER — Telehealth: Payer: Self-pay | Admitting: Podiatry

## 2023-11-15 NOTE — Telephone Encounter (Signed)
Pt called upset that no one has called her about her diabetic shoes. She was schedule to pick them up but was scheduled in the wrong office the first time. The second time she was scheduled she had fallen and was at the ed and had to get stitches so she could not make the appt.  I did check and as of now there is no appt with Nicki [place] on 12/27. I have asked if I could work her in there.  I discussed with anabell and was given the ok to put on Dr Logan Bores schedule this Friday for them to dispense.  I called pt and left message for pt to call to schedule an appt.

## 2023-11-15 NOTE — Telephone Encounter (Signed)
[name] [name] from [facility] called in and stated that the pt went to the ER 12/13 for noise bleed and want keep the provider infromed on what's going on

## 2023-11-15 NOTE — Telephone Encounter (Signed)
Noted, will review ED notes.

## 2023-11-18 ENCOUNTER — Telehealth: Payer: Self-pay | Admitting: Podiatry

## 2023-11-18 DIAGNOSIS — R040 Epistaxis: Secondary | ICD-10-CM | POA: Diagnosis not present

## 2023-11-18 DIAGNOSIS — J340 Abscess, furuncle and carbuncle of nose: Secondary | ICD-10-CM | POA: Diagnosis not present

## 2023-11-18 NOTE — Telephone Encounter (Signed)
Patient called and requested a refill on her gabapentin and to also increase the quantity since she has to take four daily.

## 2023-11-21 NOTE — Telephone Encounter (Signed)
LMOM

## 2023-11-28 ENCOUNTER — Other Ambulatory Visit: Payer: Self-pay | Admitting: Podiatry

## 2023-11-28 DIAGNOSIS — J340 Abscess, furuncle and carbuncle of nose: Secondary | ICD-10-CM | POA: Diagnosis not present

## 2023-11-28 DIAGNOSIS — R040 Epistaxis: Secondary | ICD-10-CM | POA: Diagnosis not present

## 2023-11-28 MED ORDER — GABAPENTIN 100 MG PO CAPS: 100.0000 mg | ORAL_CAPSULE | Freq: Four times a day (QID) | ORAL | 3 refills | Status: DC

## 2023-11-28 NOTE — Progress Notes (Signed)
Refill gabapentin 100 mg 4 times daily  Felecia Shelling, DPM [facility]  Dr. Felecia Shelling, DPM    [address] [place], [place] [address]                Office [phone]  Fax [phone]

## 2023-12-02 NOTE — Telephone Encounter (Signed)
 Pt called back asking to speak to Dr Janit and I explained he was in clinic today with pts. I asked if I could send him a message and she said that in sept she was measured for diabetic shoes and was originally scheduled to pick them up in [place] office but pt cannot come to [facility] office.  I did tell pt that I had spoken to her about 2 weeks ago and had left message for her to call me back that I was trying to get her in to pick up the shoes later that week. She stated she did not get the message and I did get her scheduled to pick up the diabetic shoes in [place] office and she said thank you.

## 2023-12-05 ENCOUNTER — Other Ambulatory Visit: Payer: Medicare PPO

## 2023-12-06 ENCOUNTER — Other Ambulatory Visit (INDEPENDENT_AMBULATORY_CARE_PROVIDER_SITE_OTHER): Payer: Medicare PPO

## 2023-12-06 DIAGNOSIS — D649 Anemia, unspecified: Secondary | ICD-10-CM | POA: Diagnosis not present

## 2023-12-06 DIAGNOSIS — Z0100 Encounter for examination of eyes and vision without abnormal findings: Secondary | ICD-10-CM | POA: Diagnosis not present

## 2023-12-06 LAB — CBC
HCT: 35.3 % — ABNORMAL LOW (ref 36.0–46.0)
Hemoglobin: 11.8 g/dL — ABNORMAL LOW (ref 12.0–15.0)
MCHC: 33.4 g/dL (ref 30.0–36.0)
MCV: 99.1 fL (ref 78.0–100.0)
Platelets: 244 10*3/uL (ref 150.0–400.0)
RBC: 3.57 Mil/uL — ABNORMAL LOW (ref 3.87–5.11)
RDW: 15.1 % (ref 11.5–15.5)
WBC: 3.8 10*3/uL — ABNORMAL LOW (ref 4.0–10.5)

## 2023-12-06 LAB — IBC + FERRITIN
Ferritin: 36.4 ng/mL (ref 10.0–291.0)
Iron: 204 ug/dL — ABNORMAL HIGH (ref 42–145)
Saturation Ratios: 57.6 % — ABNORMAL HIGH (ref 20.0–50.0)
TIBC: 354.2 ug/dL (ref 250.0–450.0)
Transferrin: 253 mg/dL (ref 212.0–360.0)

## 2023-12-09 ENCOUNTER — Other Ambulatory Visit: Payer: Medicare PPO

## 2023-12-15 ENCOUNTER — Telehealth: Payer: Self-pay | Admitting: Cardiovascular Disease

## 2023-12-15 NOTE — Telephone Encounter (Signed)
Patient states that [name] suggest that she sees Dr. Mariah Milling since she lives closer to the office. Please advise

## 2023-12-30 ENCOUNTER — Other Ambulatory Visit: Payer: Self-pay | Admitting: Cardiovascular Disease

## 2023-12-31 DIAGNOSIS — H401133 Primary open-angle glaucoma, bilateral, severe stage: Secondary | ICD-10-CM | POA: Diagnosis not present

## 2024-01-05 DIAGNOSIS — H0011 Chalazion right upper eyelid: Secondary | ICD-10-CM | POA: Diagnosis not present

## 2024-01-17 DIAGNOSIS — H6063 Unspecified chronic otitis externa, bilateral: Secondary | ICD-10-CM | POA: Diagnosis not present

## 2024-01-17 DIAGNOSIS — H00011 Hordeolum externum right upper eyelid: Secondary | ICD-10-CM | POA: Diagnosis not present

## 2024-01-17 DIAGNOSIS — R040 Epistaxis: Secondary | ICD-10-CM | POA: Diagnosis not present

## 2024-01-26 DIAGNOSIS — H401133 Primary open-angle glaucoma, bilateral, severe stage: Secondary | ICD-10-CM | POA: Diagnosis not present

## 2024-01-30 ENCOUNTER — Other Ambulatory Visit: Payer: Self-pay | Admitting: Cardiovascular Disease

## 2024-01-30 DIAGNOSIS — H401133 Primary open-angle glaucoma, bilateral, severe stage: Secondary | ICD-10-CM | POA: Diagnosis not present

## 2024-01-30 DIAGNOSIS — E119 Type 2 diabetes mellitus without complications: Secondary | ICD-10-CM | POA: Diagnosis not present

## 2024-01-30 DIAGNOSIS — Z0100 Encounter for examination of eyes and vision without abnormal findings: Secondary | ICD-10-CM | POA: Diagnosis not present

## 2024-01-30 DIAGNOSIS — H4311 Vitreous hemorrhage, right eye: Secondary | ICD-10-CM | POA: Diagnosis not present

## 2024-01-30 DIAGNOSIS — H43813 Vitreous degeneration, bilateral: Secondary | ICD-10-CM | POA: Diagnosis not present

## 2024-01-30 LAB — HM DIABETES EYE EXAM

## 2024-02-03 ENCOUNTER — Ambulatory Visit: Payer: Medicare PPO | Admitting: Cardiovascular Disease

## 2024-02-07 ENCOUNTER — Other Ambulatory Visit: Payer: Self-pay | Admitting: Primary Care

## 2024-02-07 DIAGNOSIS — E1122 Type 2 diabetes mellitus with diabetic chronic kidney disease: Secondary | ICD-10-CM

## 2024-02-07 NOTE — Telephone Encounter (Signed)
Patient is due for diabetes follow up, this will be required prior to any further refills.  Please schedule, thank you!   

## 2024-02-07 NOTE — Telephone Encounter (Signed)
 Lvmtcb. Sent mychart message

## 2024-02-07 NOTE — Progress Notes (Unsigned)
   Sukari Grist T. Junious Ragone, MD, CAQ Sports Medicine [facility] [address] [place], [address]  Phone: [phone]  FAX: [phone]  MCKYNLIE VANDERSLICE - 89 y.o. female  MRN [id number]  Date of Birth: 06/21/1932  Date: 02/08/2024  PCP: Doreene Nest, NP  Referral: Doreene Nest, NP  No chief complaint on file.  Subjective:   DAIONNA CROSSLAND is a 89 y.o. very pleasant female patient with There is no height or weight on file to calculate BMI. who presents with the following:  Mrs. Toren is a very well-known patient who I have known for many years and she presents today for evaluation of bilateral lower extremity swelling.  History is significant for atrial fibrillation on Eliquis, and she also has coronary disease.  On chart review, she did have a cardiac BNP that was 364 roughly 4 months ago.  In 2024, she was found to have some lower extremity swelling and have DVT on the right lower extremity.  She was placed on Eliquis, however she did have an acute GI bleed and hemoglobin dropped to 10.  Positive Hemoccult.  She does have some chronic diastolic heart failure.  Dr. Bland Span did start her on Lasix and had her resume Lasix 20 mg at her last cardiology office visit.    Review of Systems is noted in the HPI, as appropriate  Objective:   There were no vitals taken for this visit.  GEN: No acute distress; alert,appropriate. PULM: Breathing comfortably in no respiratory distress PSYCH: Normally interactive.   Laboratory and Imaging Data:  Assessment and Plan:   ***

## 2024-02-08 ENCOUNTER — Ambulatory Visit (HOSPITAL_COMMUNITY)

## 2024-02-08 ENCOUNTER — Ambulatory Visit: Admitting: Family Medicine

## 2024-02-08 ENCOUNTER — Encounter: Payer: Self-pay | Admitting: Family Medicine

## 2024-02-08 VITALS — BP 112/60 | HR 91 | Temp 97.9°F | Ht 69.0 in | Wt 171.1 lb

## 2024-02-08 DIAGNOSIS — I5032 Chronic diastolic (congestive) heart failure: Secondary | ICD-10-CM

## 2024-02-08 DIAGNOSIS — R600 Localized edema: Secondary | ICD-10-CM

## 2024-02-08 LAB — BASIC METABOLIC PANEL
BUN: 19 mg/dL (ref 6–23)
CO2: 31 meq/L (ref 19–32)
Calcium: 9.3 mg/dL (ref 8.4–10.5)
Chloride: 102 meq/L (ref 96–112)
Creatinine, Ser: 1.14 mg/dL (ref 0.40–1.20)
GFR: 42.53 mL/min — ABNORMAL LOW (ref >60.00)
Glucose, Bld: 167 mg/dL — ABNORMAL HIGH (ref 70–99)
Potassium: 4.5 meq/L (ref 3.5–5.1)
Sodium: 139 meq/L (ref 135–145)

## 2024-02-08 LAB — BRAIN NATRIURETIC PEPTIDE: Pro B Natriuretic peptide (BNP): 621 pg/mL — ABNORMAL HIGH (ref 0.0–100.0)

## 2024-02-08 LAB — HEPATIC FUNCTION PANEL
ALT: 7 U/L (ref 0–35)
AST: 15 U/L (ref 0–37)
Albumin: 4.4 g/dL (ref 3.5–5.2)
Alkaline Phosphatase: 34 U/L — ABNORMAL LOW (ref 39–117)
Bilirubin, Direct: 0.2 mg/dL (ref 0.0–0.3)
Total Bilirubin: 0.7 mg/dL (ref 0.2–1.2)
Total Protein: 6.5 g/dL (ref 6.0–8.3)

## 2024-02-08 NOTE — Telephone Encounter (Signed)
 Patient has been scheduled

## 2024-02-09 ENCOUNTER — Ambulatory Visit
Admission: RE | Admit: 2024-02-09 | Discharge: 2024-02-09 | Disposition: A | Discharge status: Home / Self Care | Source: Ambulatory Visit | Attending: Family Medicine | Admitting: Family Medicine

## 2024-02-09 DIAGNOSIS — R600 Localized edema: Secondary | ICD-10-CM | POA: Diagnosis not present

## 2024-02-13 DIAGNOSIS — H903 Sensorineural hearing loss, bilateral: Secondary | ICD-10-CM | POA: Diagnosis not present

## 2024-02-17 ENCOUNTER — Other Ambulatory Visit: Payer: Medicare PPO

## 2024-02-17 ENCOUNTER — Ambulatory Visit: Payer: Medicare PPO | Admitting: Podiatry

## 2024-02-17 DIAGNOSIS — M19071 Primary osteoarthritis, right ankle and foot: Secondary | ICD-10-CM

## 2024-02-17 DIAGNOSIS — G629 Polyneuropathy, unspecified: Secondary | ICD-10-CM

## 2024-02-17 MED ORDER — GABAPENTIN 300 MG PO CAPS: 300.0000 mg | ORAL_CAPSULE | Freq: Two times a day (BID) | ORAL | 2 refills | Status: DC

## 2024-02-17 MED ORDER — BETAMETHASONE SOD PHOS & ACET 6 (3-3) MG/ML IJ SUSP: 3.0000 mg | Freq: Once | INTRAMUSCULAR | Status: DC

## 2024-02-17 NOTE — Progress Notes (Signed)
 Chief Complaint  Patient presents with   Foot Pain    "When I twist and turn my left ankle, it hurts.  They still swell.  The balls of my feet sting and burn." N - burning and tingling L - 1-5 met. Bilateral D - 1 year O - suddenly, gotten worse C - burn, sting  A - none T - taking Gabapentin    SUBJECTIVE Patient with a history of diabetes mellitus presenting for continued peripheral polyneuropathy to the bilateral forefoot.  She also has been experiencing right ankle and knee pain as well.  Her orthopedist was originally concerned about possible recurrent DVT which was negative.  Currently taking gabapentin 100 mg twice daily.   Past Medical History:  Diagnosis Date   Acute bilateral thoracic back pain 08/16/2019   Allergy    Cipro, Plavix, Statins   Anemia    Arthritis    Atrial fibrillation (HCC)    CAD (coronary artery disease)    a. s/p tandem Promus DES to LAD in 2009 by Dr. Juanda Chance;  b.  LHC (03/22/14):  prox LAD 30%, mid LAD 40%, LAD stent ok with dist 20% ISR, apical LAD occluded with L-L collats filling apical vessel (too small for PCI), mid RCA 20%, EF 70%.  Med Rx   Cancer [facility])    Chronic kidney disease, stage 3b (HCC)    Colon polyps    Diabetes mellitus    Diagnosed 2012   Diverticulosis of colon (without mention of hemorrhage)    DVT (deep venous thrombosis) (HCC) 12/2022   right leg   GERD (gastroesophageal reflux disease)    Glaucoma    Grade I diastolic dysfunction    Grade I diastolic dysfunction    Herpes zoster without complication 04/24/2020   Hyperlipidemia    intol of statins   Hypertension    Kidney stones    Left atrial dilation    Moderate aortic regurgitation    Motion sickness    back seat cars   NHL (non-Hodgkin's lymphoma) (HCC) dx'd 2003   chemo/xrt comp 2003   Personal history of colonic polyps    Proctitis    Pruritic intertrigo 07/18/2014   Urticaria    Wears dentures    Full upper, partial lower   Wears hearing aid in  both ears     OBJECTIVE General Patient is awake, alert, and oriented x 3 and in no acute distress. Derm there is some slight superficial dermatitis around the first MTP of the right foot possibly secondary to friction and rubbing in certain shoes.  No open wound.  Clinically no indication of infection or deeper cellulitis. Vasc  DP and PT pedal pulses palpable bilaterally. Temperature gradient within normal limits.  Neuro light touch and protective threshold sensation diminished bilaterally.  Paresthesia with numbness and tingling sensation noted to the bilateral forefoot Musculoskeletal Exam today there is pain and tenderness associated to the right ankle joint.  Muscle strength 5/5 all compartments.   ASSESSMENT 1. Diabetes Mellitus w/ peripheral neuropathy 2.  First MTP capsulitis/hallux limitus bilateral 3.  Recent DVT RLE 4.  Capsulitis/arthritis right ankle  PLAN OF CARE -Patient evaluated - Injection of 0.5 cc Celestone Soluspan injected into the right ankle medial aspect - Prescription for gabapentin 300mg  BID.  Discontinue gabapentin 100 mg - Continue diabetic shoes with custom molded Plastizote insoles -Return to clinic as needed   Felecia Shelling, DPM [facility]  Dr. Felecia Shelling, DPM  [address] [place], [place] [address]                Office [phone]  Fax [phone]

## 2024-02-22 ENCOUNTER — Encounter: Payer: Self-pay | Admitting: Primary Care

## 2024-02-22 ENCOUNTER — Ambulatory Visit: Admitting: Primary Care

## 2024-02-22 VITALS — BP 126/68 | HR 84 | Temp 97.2°F | Ht 69.0 in | Wt 167.0 lb

## 2024-02-22 DIAGNOSIS — D508 Other iron deficiency anemias: Secondary | ICD-10-CM

## 2024-02-22 DIAGNOSIS — Z7984 Long term (current) use of oral hypoglycemic drugs: Secondary | ICD-10-CM | POA: Diagnosis not present

## 2024-02-22 DIAGNOSIS — E1122 Type 2 diabetes mellitus with diabetic chronic kidney disease: Secondary | ICD-10-CM | POA: Diagnosis not present

## 2024-02-22 DIAGNOSIS — Z7985 Long-term (current) use of injectable non-insulin antidiabetic drugs: Secondary | ICD-10-CM

## 2024-02-22 DIAGNOSIS — N1832 Chronic kidney disease, stage 3b: Secondary | ICD-10-CM

## 2024-02-22 LAB — POCT GLYCOSYLATED HEMOGLOBIN (HGB A1C): Hemoglobin A1C: 6.1 % — AB (ref 4.0–5.6)

## 2024-02-22 NOTE — Patient Instructions (Addendum)
 Medications:  STOP iron  Continue other current medications   Follow up in 6 months for diabetes check   Schedule appointment with Dr. Patsy Lager for your knee  Monitor blood sugars at home routinely

## 2024-02-22 NOTE — Progress Notes (Signed)
 Established Patient Office Visit  Subjective   Patient ID: [name] [name], female    DOB: [birth year]  Age: 89 y.o. MRN: [id number]  Chief Complaint  Patient presents with   Medical Management of Chronic Issues    HPI  [name] [name] is a 89 year old female with history of type 2 diabetes mellitus with CKD, hypertension, CAD, MVP, aortic insuffiencey, Afib, DT, GERD, osteopenia, hyperlipidemia, recurrent UTI who presents today for diabetes follow up.   Current medications include:  Glipizide XL 10mg  daily Trulicity 0.75mg  once weekly   She is checking her blood glucose AM fasting & PM (1-2 hours postprandial) and is getting readings of: - AM fasting 130s - PM post-prandial 170s-180s - Occasionally checks midday with readings in 200s - Occasionally BS 90s, notes jitteriness, shakiness which resolves with snack/juice.   Last A1C: 7.9% (08/24/23), 6.1% today Last Eye Exam: UTD Last Foot Exam: due 03/2024 Pneumonia Vaccination: UTD Urine Microalbumin: due Statin: statin-intolerant - on Nexlizet 180/10mg  once daily   Dietary changes since last visit: none Diet consists of: peanut butter crackers, oranges, apples, pop-tart, sausage biscuit, chicken, vegetables daily, salad  Beverages: ginger ale, punch, water, coffee  Exercise: none,  related to leg pain   Denies polyuria, polydipsia, polyphagia. Notes tingling bilaterally to plantar surface of feet and toes. Follows with podiatry and gabapentin dose increased to 300mg  BID 5 days ago.   Reports loose BMs, with some inability to control stools. Symptoms for several months, began before initiation of trulicity. No food or beverage triggers.   Also notes knee pain. Requesting appointment with Dr. Patsy Lager for injection. Last 10/2023.    Review of Systems  Eyes:  Negative for blurred vision and double vision.  Respiratory:  Negative for sputum production.   Cardiovascular:  Negative for chest pain and palpitations.  Gastrointestinal:   Positive for diarrhea. Negative for abdominal pain, constipation, nausea and vomiting.  Genitourinary:  Positive for urgency.  Musculoskeletal:  Positive for joint pain.       Left knee and right ankle   Neurological:  Positive for tingling and sensory change.  Endo/Heme/Allergies:  Negative for polydipsia.      Objective:     BP 126/68   Pulse 84   Temp (!) 97.2 F (36.2 C) (Temporal)   Ht 5\' 9"  (1.753 m)   Wt 75.8 kg   SpO2 97%   BMI 24.66 kg/m    BP Readings from Last 3 Encounters:  02/22/24 126/68  02/08/24 112/60  11/10/23 (!) 150/98   Wt Readings from Last 3 Encounters:  02/22/24 75.8 kg  02/08/24 77.6 kg  11/10/23 76.7 kg   Physical Exam Constitutional:      Appearance: Normal appearance. She is normal weight.  Cardiovascular:     Rate and Rhythm: Normal rate. Rhythm irregular.     Pulses: Normal pulses.          Radial pulses are 2+ on the right side and 2+ on the left side.       Dorsalis pedis pulses are 2+ on the right side and 2+ on the left side.     Comments: Ankle edema Pulmonary:     Effort: Pulmonary effort is normal.     Breath sounds: Normal breath sounds.  Skin:    General: Skin is warm and dry.  Neurological:     General: No focal deficit present.     Mental Status: She is alert and oriented to person, place, and time.  Psychiatric:  Mood and Affect: Mood normal.        Behavior: Behavior normal.        Thought Content: Thought content normal.      Results for orders placed or performed in visit on 02/22/24  POCT glycosylated hemoglobin (Hb A1C)  Result Value Ref Range   Hemoglobin A1C 6.1 (A) 4.0 - 5.6 %   HbA1c POC (<> result, manual entry)     HbA1c, POC (prediabetic range)     HbA1c, POC (controlled diabetic range)        The ASCVD Risk score (Arnett DK, et al., 2019) failed to calculate for the following reasons:   The 2019 ASCVD risk score is only valid for ages 33 to 38    Assessment & Plan:   Problem List  Items Addressed This Visit       Endocrine   Type 2 diabetes mellitus with chronic kidney disease (HCC) - Primary   Improved and at goal with A1c 6.1% today   Foot exam: due 03/2024 Urine microalbumin: due   Continue glipizide XL 10mg  daily and trulicity 0.75mg  once weekly  Continue to monitor home blood sugars  Reviewed hypoglycemia awareness and management   Follow up in 6 months       Relevant Orders   POCT glycosylated hemoglobin (Hb A1C) (Completed)     Other   Iron deficiency anemia   With possible side effects of diarrhea, on iron supplementation, will discontinue ferrous sulfate 325mg  daily  Iron studies from 11/2023 review, stable      Follow up in 6 months for annual physical     Lindell Spar, RN

## 2024-02-22 NOTE — Assessment & Plan Note (Addendum)
 Improved and at goal with A1c 6.1% today   Foot exam: due 03/2024 Urine microalbumin: due   Continue glipizide XL 10mg  daily and trulicity 0.75mg  once weekly  Continue to monitor home blood sugars  Reviewed hypoglycemia awareness and management   Follow up in 6 months   I evaluated patient, was consulted regarding treatment, and agree with assessment and plan per Julaine Fusi, MSN, FNP student.   Mayra Reel, NP-C

## 2024-02-22 NOTE — Progress Notes (Signed)
 Subjective:    Patient ID: [name] [name], female    DOB: 28-Jun-1932, 89 y.o.   MRN: [id number]  HPI  [name] [name] is a very pleasant 89 y.o. female with a history of hypertension, CAD, persistent atrial fibrillation, type 2 diabetes, osteopenia, chronic fatigue, anxiety depression, insomnia, chronic knee pain, iron deficiency anemia who presents today for follow-up of diabetes.  Current medications include: Glipizide XL 10 mg daily, Trulicity 0.75 mg weekly  She has noticed more loose stools over the last few months. This began prior to initiation of Trulicity.  She is checking her blood glucose 1-2 times daily and is getting readings of:  AM fasting: 130s Bedtime: 170-180s  Last A1C: 7.9 in September 2024, 6.1 today Last Eye Exam: UTD Last Foot Exam: UTD Pneumonia Vaccination: 2021 Urine Microalbumin: UTD Statin: None. Intolerant.   Dietary changes since last visit: Pop tarts, biscuits, veggies, crackers, chicken.    Exercise: None.  BP Readings from Last 3 Encounters:  02/22/24 126/68  02/08/24 112/60  11/10/23 (!) 150/98     Review of Systems  Respiratory:  Negative for shortness of breath.   Cardiovascular:  Negative for chest pain.  Endocrine: Negative for polydipsia, polyphagia and polyuria.  Musculoskeletal:  Positive for arthralgias.  Neurological:  Positive for numbness. Negative for dizziness.         Past Medical History:  Diagnosis Date   Acute bilateral thoracic back pain 08/16/2019   Allergy    Cipro, Plavix, Statins   Anemia    Arthritis    Atrial fibrillation (HCC)    CAD (coronary artery disease)    a. s/p tandem Promus DES to LAD in 2009 by Dr. Juanda Chance;  b.  LHC (03/22/14):  prox LAD 30%, mid LAD 40%, LAD stent ok with dist 20% ISR, apical LAD occluded with L-L collats filling apical vessel (too small for PCI), mid RCA 20%, EF 70%.  Med Rx   Cancer [facility])    Chronic kidney disease, stage 3b (HCC)    Colon polyps    Diabetes mellitus     Diagnosed 2012   Diverticulosis of colon (without mention of hemorrhage)    DVT (deep venous thrombosis) (HCC) 12/2022   right leg   GERD (gastroesophageal reflux disease)    Glaucoma    Grade I diastolic dysfunction    Grade I diastolic dysfunction    Herpes zoster without complication 04/24/2020   Hyperlipidemia    intol of statins   Hypertension    Kidney stones    Left atrial dilation    Moderate aortic regurgitation    Motion sickness    back seat cars   NHL (non-Hodgkin's lymphoma) (HCC) dx'd 2003   chemo/xrt comp 2003   Personal history of colonic polyps    Proctitis    Pruritic intertrigo 07/18/2014   Urticaria    Wears dentures    Full upper, partial lower   Wears hearing aid in both ears     Social History   Socioeconomic History   Marital status: Widowed    Spouse name: Not on file   Number of children: 0   Years of education: Not on file   Highest education level: 12th grade  Occupational History   Occupation: Retired    Associate Professor: RETIRED   Occupation: retired  Tobacco Use   Smoking status: Never   Smokeless tobacco: Never  Vaping Use   Vaping status: Never Used  Substance and Sexual Activity   Alcohol use: No  Drug use: No   Sexual activity: Not Currently  Other Topics Concern   Not on file  Social History Narrative   Not on file   Social Drivers of Health   Financial Resource Strain: Low Risk  (02/07/2024)   Overall Financial Resource Strain (CARDIA)    Difficulty of Paying Living Expenses: Not hard at all  Food Insecurity: No Food Insecurity (02/07/2024)   Hunger Vital Sign    Worried About Running Out of Food in the Last Year: Never true    Ran Out of Food in the Last Year: Never true  Transportation Needs: No Transportation Needs (02/07/2024)   PRAPARE - Administrator, Civil Service (Medical): No    Lack of Transportation (Non-Medical): No  Physical Activity: Unknown (02/07/2024)   Exercise Vital Sign    Days of Exercise  per Week: 0 days    Minutes of Exercise per Session: Not on file  Stress: No Stress Concern Present (02/07/2024)   [employer] of Occupational Health - Occupational Stress Questionnaire    Feeling of Stress : Not at all  Social Connections: Moderately Integrated (02/07/2024)   Social Connection and Isolation Panel [NHANES]    Frequency of Communication with Friends and Family: More than three times a week    Frequency of Social Gatherings with Friends and Family: Once a week    Attends Religious Services: More than 4 times per year    Active Member of [employer] or Organizations: Yes    Attends Banker Meetings: More than 4 times per year    Marital Status: Widowed  Intimate Partner Violence: Not At Risk (10/26/2023)   Humiliation, Afraid, Rape, and Kick questionnaire    Fear of Current or Ex-Partner: No    Emotionally Abused: No    Physically Abused: No    Sexually Abused: No    Past Surgical History:  Procedure Laterality Date   BIOPSY  10/15/2023   Procedure: BIOPSY;  Surgeon: Norma Fredrickson, Boykin Nearing, MD;  Location: [facility] ENDOSCOPY;  Service: Gastroenterology;;   cardiac cath-neg     CATARACT EXTRACTION Bilateral    COLONOSCOPY WITH PROPOFOL N/A 10/15/2023   Procedure: COLONOSCOPY WITH PROPOFOL;  Surgeon: Toledo, Boykin Nearing, MD;  Location: [facility] ENDOSCOPY;  Service: Gastroenterology;  Laterality: N/A;   CORONARY ANGIOPLASTY WITH STENT PLACEMENT     x 2   dexa-neg     ESOPHAGOGASTRODUODENOSCOPY (EGD) WITH PROPOFOL N/A 10/15/2023   Procedure: ESOPHAGOGASTRODUODENOSCOPY (EGD) WITH PROPOFOL;  Surgeon: Toledo, Boykin Nearing, MD;  Location: [facility] ENDOSCOPY;  Service: Gastroenterology;  Laterality: N/A;   HEMOSTASIS CLIP PLACEMENT  10/15/2023   Procedure: HEMOSTASIS CLIP PLACEMENT;  Surgeon: Norma Fredrickson, Boykin Nearing, MD;  Location: [facility] ENDOSCOPY;  Service: Gastroenterology;;   KNEE SURGERY  10/30/2003   left   LEFT HEART CATHETERIZATION WITH CORONARY ANGIOGRAM N/A 03/22/2014   Procedure:  LEFT HEART CATHETERIZATION WITH CORONARY ANGIOGRAM;  Surgeon: Kathleene Hazel, MD;  Location: [facility] CATH LAB;  Service: Cardiovascular;  Laterality: N/A;   POLYPECTOMY  10/15/2023   Procedure: POLYPECTOMY;  Surgeon: Toledo, Boykin Nearing, MD;  Location: [facility] ENDOSCOPY;  Service: Gastroenterology;;   stress cardiolite     VAGINAL HYSTERECTOMY     partial, fibroids, one ovary left    Family History  Problem Relation Age of Onset   Cancer Mother        jaw   Hypertension Father    Heart attack Father    Heart attack Sister    Cancer Sister  unknown primary-mets throughout body   Colon cancer Neg Hx    Esophageal cancer Neg Hx    Pancreatic cancer Neg Hx    Stomach cancer Neg Hx    Liver disease Neg Hx     Allergies  Allergen Reactions   Other Rash and Anaphylaxis   Shellfish Allergy Anaphylaxis and Rash   Cephalexin     REACTION: trash and swelling   Ciprofloxacin     REACTION: u/k   Clopidogrel Bisulfate     REACTION: swelling, rash   Ezetimibe-Simvastatin     REACTION: myalgias   Lidocaine Hives    ONLY TO ADHESIVE LIDOCAINE PATCHES. NO PROBLEM WITH INJECTABLE LIDOCAINE.   Nitrofurantoin     REACTION: [place]   Pregabalin     REACTION: felt bad   Celecoxib Rash    REACTION: [place]   Codeine Rash   Codeine Phosphate Rash   Hydrocod Poli-Chlorphe Poli Er Rash   Propoxyphene Rash   Propoxyphene N-Acetaminophen Rash   Rofecoxib Rash   Sulfa Antibiotics Rash   Sulfamethoxazole Rash   Sulfonamide Derivatives Rash    Current Outpatient Medications on File Prior to Visit  Medication Sig Dispense Refill   Bempedoic Acid-Ezetimibe (NEXLIZET) 180-10 MG TABS Take 1 tablet by mouth daily. Pt has scheduled an office visit with provider in April, 2025 90 tablet 2   Blood Glucose Monitoring Suppl DEVI 1 each by Does not apply route in the morning, at noon, and at bedtime. May substitute to any manufacturer covered by patient's insurance. 1 each 0   brimonidine (ALPHAGAN) 0.2 %  ophthalmic solution Place 1 drop into the right eye in the morning and at bedtime.     Cholecalciferol (VITAMIN D-3) 1000 UNITS CAPS Take 1 capsule by mouth daily.     CRANBERRY PO Take 1 tablet by mouth daily.     diclofenac Sodium (VOLTAREN) 1 % GEL APPLY 2 GRAMS TOPICALLY THREE TIMES A DAY AS NEEDED 100 g 2   Dulaglutide (TRULICITY) 0.75 MG/0.5ML SOAJ Inject 0.75 mg into the skin once a week. for diabetes. 2 mL 0   ELIQUIS 5 MG TABS tablet TAKE 1 TABLET TWICE DAILY 180 tablet 3   ferrous sulfate 325 (65 FE) MG tablet Take 1 tablet (325 mg total) by mouth daily with breakfast. 30 tablet 2   fexofenadine (ALLEGRA) 180 MG tablet Take 1 tablet (180 mg total) by mouth as needed for allergies or rhinitis. 90 tablet 0   furosemide (LASIX) 20 MG tablet Take 1 tablet by mouth daily. May take additional tablet as needed for swelling 90 tablet 3   gabapentin (NEURONTIN) 300 MG capsule Take 1 capsule (300 mg total) by mouth 2 (two) times daily. 90 capsule 2   glipiZIDE (GLUCOTROL XL) 10 MG 24 hr tablet Take 1 tablet (10 mg total) by mouth daily with breakfast. for diabetes. 90 tablet 1   Glucose Blood (BLOOD GLUCOSE TEST STRIPS) STRP 1 each by In Vitro route in the morning, at noon, and at bedtime. May substitute to any manufacturer covered by patient's insurance. 300 strip 5   isosorbide mononitrate (IMDUR) 60 MG 24 hr tablet Take 1.5 tablets (90 mg total) by mouth daily. 135 tablet 1   Lancets Misc. MISC 1 each by Does not apply route in the morning, at noon, and at bedtime. May substitute to any manufacturer covered by patient's insurance. 300 each 5   metoprolol succinate (TOPROL-XL) 100 MG 24 hr tablet TAKE 1 TABLET BY MOUTH EVERY MORNING WITH  OR IMMEDIATELY FOLLOWING A MEAL 90 tablet 2   nitroGLYCERIN (NITROSTAT) 0.4 MG SL tablet Place 1 tablet (0.4 mg total) under the tongue every 5 (five) minutes as needed for chest pain. 75 tablet 2   pantoprazole (PROTONIX) 20 MG tablet TAKE 1 TABLET BY MOUTH DAILY  FOR HEARTBURN 90 tablet 2   ROCKLATAN 0.02-0.005 % SOLN Place 1 drop into the right eye at bedtime.     sertraline (ZOLOFT) 100 MG tablet TAKE 1 TABLET BY MOUTH DAILY FOR ANXIETY AND DEPRESSION 90 tablet 2   triamcinolone cream (KENALOG) 0.1 % Apply 1 Application topically 2 (two) times daily. 30 g 1   vitamin B-12 (CYANOCOBALAMIN) 1000 MCG tablet Take 1,000 mcg by mouth daily.     [DISCONTINUED] loratadine (CLARITIN) 10 MG tablet Take 10 mg by mouth as needed.      Current Facility-Administered Medications on File Prior to Visit  Medication Dose Route Frequency Provider Last Rate Last Admin   betamethasone acetate-betamethasone sodium phosphate (CELESTONE) injection 3 mg  3 mg Intra-articular Once         BP 126/68   Pulse 84   Temp (!) 97.2 F (36.2 C) (Temporal)   Ht 5\' 9"  (1.753 m)   Wt 167 lb (75.8 kg)   SpO2 97%   BMI 24.66 kg/m  Objective:   Physical Exam Cardiovascular:     Rate and Rhythm: Normal rate. Rhythm irregular.  Pulmonary:     Effort: Pulmonary effort is normal.     Breath sounds: Normal breath sounds.  Musculoskeletal:     Cervical back: Neck supple.  Skin:    General: Skin is warm and dry.  Neurological:     Mental Status: She is alert and oriented to person, place, and time.  Psychiatric:        Mood and Affect: Mood normal.           Assessment & Plan:  Type 2 diabetes mellitus with stage 3b chronic kidney disease, without long-term current use of insulin (HCC) Assessment & Plan: Improved and at goal with A1c 6.1% today   Foot exam: due 03/2024 Urine microalbumin: due   Continue glipizide XL 10mg  daily and trulicity 0.75mg  once weekly  Continue to monitor home blood sugars  Reviewed hypoglycemia awareness and management   Follow up in 6 months   I evaluated patient, was consulted regarding treatment, and agree with assessment and plan per Julaine Fusi, MSN, FNP student.   Mayra Reel, NP-C   Orders: -     POCT glycosylated hemoglobin  (Hb A1C)  Other iron deficiency anemia Assessment & Plan: With possible side effects of diarrhea, on iron supplementation, will discontinue ferrous sulfate 325mg  daily  Iron studies from 11/2023 review, stable  Continue to monitor.  I evaluated patient, was consulted regarding treatment, and agree with assessment and plan per Julaine Fusi, MSN, FNP student.   Mayra Reel, NP-C          Doreene Nest, NP

## 2024-02-22 NOTE — Assessment & Plan Note (Addendum)
 With possible side effects of diarrhea, on iron supplementation, will discontinue ferrous sulfate 325mg  daily  Iron studies from 11/2023 review, stable  Continue to monitor.  I evaluated patient, was consulted regarding treatment, and agree with assessment and plan per Julaine Fusi, MSN, FNP student.   Mayra Reel, NP-C

## 2024-02-28 NOTE — Progress Notes (Unsigned)
     Adeel Guiffre T. Jnaya Butrick, MD, CAQ Sports Medicine [facility] [address] [place], [address]  Phone: [phone]  FAX: [phone]  TYIANA HILL - 89 y.o. female  MRN [id number]  Date of Birth: 03/24/1932  Date: 02/29/2024  PCP: Doreene Nest, NP  Referral: Doreene Nest, NP  No chief complaint on file.  Subjective:   CHAUNTAE HULTS is a 89 y.o. very pleasant female patient with There is no height or weight on file to calculate BMI. who presents with the following:  Mrs. Don is here to follow-up and talk about her knee pain and arthritis.  I actually saw her a few weeks ago with some bilateral lower extremity edema, her cardiac BNP was greater than 600, I did increase her Lasix.  She has an upcoming appointment with cardiology.  I last saw her for her knees in early December 2024, at that point I did do a right sided knee injection.    Review of Systems is noted in the HPI, as appropriate  Objective:   There were no vitals taken for this visit.  GEN: No acute distress; alert,appropriate. PULM: Breathing comfortably in no respiratory distress PSYCH: Normally interactive.   Laboratory and Imaging Data:  Assessment and Plan:   ***

## 2024-02-29 ENCOUNTER — Encounter: Payer: Self-pay | Admitting: Family Medicine

## 2024-02-29 ENCOUNTER — Ambulatory Visit: Admitting: Family Medicine

## 2024-02-29 VITALS — BP 120/72 | HR 90 | Temp 98.3°F | Ht 69.0 in | Wt 166.5 lb

## 2024-02-29 DIAGNOSIS — M170 Bilateral primary osteoarthritis of knee: Secondary | ICD-10-CM

## 2024-02-29 DIAGNOSIS — M1711 Unilateral primary osteoarthritis, right knee: Secondary | ICD-10-CM

## 2024-02-29 DIAGNOSIS — E1140 Type 2 diabetes mellitus with diabetic neuropathy, unspecified: Secondary | ICD-10-CM | POA: Diagnosis not present

## 2024-02-29 MED ORDER — TRIAMCINOLONE ACETONIDE 40 MG/ML IJ SUSP
40.0000 mg | Freq: Once | INTRAMUSCULAR | Status: AC
  Administered 2024-02-29: 40 mg via INTRA_ARTICULAR

## 2024-02-29 NOTE — Patient Instructions (Signed)
 Increase your Gabapentin to 3 times a day

## 2024-03-03 NOTE — Progress Notes (Unsigned)
 Cardiology Office Note  Date:  03/05/2024   ID:  [name] [name], DOB 10-17-1932, MRN [id number]  PCP:  [name] Nest, NP   Chief Complaint  Patient presents with   Follow-up    Patient states that sometimes if she does a lot of work in the house she sometimes get shortness of breath. Meds reviewed.     HPI:  [name] is a 89 yo female with history of  CAD,s/p PCI 2009 tandem non-overlapping drug-eluting stents placed in the LAD  cardiac cath April 2015 with patent LAD stents, occluded apical LAD segment HLD, HTN,  non-Hodgkin's lymphoma,  DM and  aortic valve insufficiency  Statin intolerance Echo 11/24: EF 60% Who presents for f/u of her CAD, hx of PCI, long-term persistent atrial fibrillation 9/24  [name] is transferring care from [facility] to [place] office Prior cardiac records reviewed Echo 01/20/16 with normal LV size and function, trivial aortic valve insufficiency.   bilateral foot pain and normal ABI in 2016.  nuclear stress test October 2017 with no ischemia.   Echo May 2021 with LVEF=60-65%. Mild AI.  Echo May 2023 with LVEF=60-65%, mild LVH. Mild AI.    12/17/22  swelling in her right leg.  Venous dopplers with right LE DVT.   started on Eliquis.  She was seen in our office 12/24/22 and reported dark black stools. She was sent to the ED and her Hgb was down to 10 from 11 on 12/20/22. Positive hemoccult stool card. Her Eliquis was lowered to 5 mg po BID.   [facility] on 10/09/23 following a syncopal event while standing up from the toilet.  several days of epistaxis prior to admission. Troponin negative. During her hospital stay her Hgb dropped from 9.2 to 6.7. She was started on Iv Protonix and transfused 2 units of pRBCs. She underwent EGD and colonoscopy.  EGD revealed a normal esophagus, multiple gastric polyps which were biopsied.  Colonoscopy revealed nonbleeding internal hemorrhoids, mild diverticulosis without acute diverticulitis or diverticular  bleeding.  She had a 19 mm polyp in the proximal ascending colon which was removed, resected  *Echo 10/11/23 with LVEF=60-65%. Trivial AI. Her syncope was felt to be due to her anemia and orthostasis from dehydration.   On further discussion today, she reports overall doing well Some mild SOB on exertion, fatigue at times On gabapentin, makes her sleepy, takes the medication TID for neuropathy Chronic right knee pain, recent cortisone has put off knee replacement  Denies chest pain concerning for angina, does not feel she is having much effect from atrial fibrillation   Lab work reviewed Total cholesterol 111 LDL 35 A1C 6.1 HGB 11.8  EKG personally reviewed by myself on todays visit EKG Interpretation Date/Time:  Monday March 05 2024 09:25:10 EDT Ventricular Rate:  86 PR Interval:    QRS Duration:  72 QT Interval:  366 QTC Calculation: 437 R Axis:   -23  Text Interpretation: Atrial fibrillation Minimal voltage criteria for LVH, may be normal variant ( R in aVL ) Inferior infarct , age undetermined Anterior infarct , age undetermined When compared with ECG of 09-Oct-2023 20:30, No significant change was found Confirmed by [name] [name] [phone]) on 03/05/2024 9:46:20 AM    PMH:   has a past medical history of Acute bilateral thoracic back pain (08/16/2019), Allergy, Anemia, Arthritis, Atrial fibrillation (HCC), CAD (coronary artery disease), Cancer (HCC), Chronic kidney disease, stage 3b (HCC), Colon polyps, Diabetes mellitus, Diverticulosis of colon (without mention of hemorrhage), DVT (  deep venous thrombosis) (HCC) (12/2022), GERD (gastroesophageal reflux disease), Glaucoma, Grade I diastolic dysfunction, Grade I diastolic dysfunction, Herpes zoster without complication (04/24/2020), Hyperlipidemia, Hypertension, Kidney stones, Left atrial dilation, Moderate aortic regurgitation, Motion sickness, NHL (non-Hodgkin's lymphoma) (HCC) (dx'd 2003), Personal history of colonic polyps,  Proctitis, Pruritic intertrigo (07/18/2014), Urticaria, Wears dentures, and Wears hearing aid in both ears.  PSH:    Past Surgical History:  Procedure Laterality Date   BIOPSY  10/15/2023   Procedure: BIOPSY;  Surgeon: [name] [name], [name] Nearing, MD;  Location: [facility] ENDOSCOPY;  Service: Gastroenterology;;   cardiac cath-neg     CATARACT EXTRACTION Bilateral    COLONOSCOPY WITH PROPOFOL N/A 10/15/2023   Procedure: COLONOSCOPY WITH PROPOFOL;  Surgeon: [name] [name], [name] Nearing, MD;  Location: [facility] ENDOSCOPY;  Service: Gastroenterology;  Laterality: N/A;   CORONARY ANGIOPLASTY WITH STENT PLACEMENT     x 2   dexa-neg     ESOPHAGOGASTRODUODENOSCOPY (EGD) WITH PROPOFOL N/A 10/15/2023   Procedure: ESOPHAGOGASTRODUODENOSCOPY (EGD) WITH PROPOFOL;  Surgeon: [name] [name], [name] Nearing, MD;  Location: [facility] ENDOSCOPY;  Service: Gastroenterology;  Laterality: N/A;   HEMOSTASIS CLIP PLACEMENT  10/15/2023   Procedure: HEMOSTASIS CLIP PLACEMENT;  Surgeon: [name] [name], [name] Nearing, MD;  Location: [facility] ENDOSCOPY;  Service: Gastroenterology;;   KNEE SURGERY  10/30/2003   left   LEFT HEART CATHETERIZATION WITH CORONARY ANGIOGRAM N/A 03/22/2014   Procedure: LEFT HEART CATHETERIZATION WITH CORONARY ANGIOGRAM;  Surgeon: [name] Hazel, MD;  Location: [facility] CATH LAB;  Service: Cardiovascular;  Laterality: N/A;   POLYPECTOMY  10/15/2023   Procedure: POLYPECTOMY;  Surgeon: [name] [name], [name] Nearing, MD;  Location: [facility] ENDOSCOPY;  Service: Gastroenterology;;   stress cardiolite     VAGINAL HYSTERECTOMY     partial, fibroids, one ovary left    Current Outpatient Medications  Medication Sig Dispense Refill   Bempedoic Acid-Ezetimibe (NEXLIZET) 180-10 MG TABS Take 1 tablet by mouth daily. Pt has scheduled an office visit with provider in April, 2025 90 tablet 2   Blood Glucose Monitoring Suppl DEVI 1 each by Does not apply route in the morning, at noon, and at bedtime. May substitute to any manufacturer covered by patient's insurance. 1 each 0    brimonidine (ALPHAGAN) 0.2 % ophthalmic solution Place 1 drop into the right eye in the morning and at bedtime.     Cholecalciferol (VITAMIN D-3) 1000 UNITS CAPS Take 1 capsule by mouth daily.     CRANBERRY PO Take 1 tablet by mouth daily.     diclofenac Sodium (VOLTAREN) 1 % GEL APPLY 2 GRAMS TOPICALLY THREE TIMES A DAY AS NEEDED 100 g 2   Dulaglutide (TRULICITY) 0.75 MG/0.5ML SOAJ Inject 0.75 mg into the skin once a week. for diabetes. 2 mL 0   ELIQUIS 5 MG TABS tablet TAKE 1 TABLET TWICE DAILY 180 tablet 3   ferrous sulfate 325 (65 FE) MG tablet Take 1 tablet (325 mg total) by mouth daily with breakfast. 30 tablet 2   fexofenadine (ALLEGRA) 180 MG tablet Take 1 tablet (180 mg total) by mouth as needed for allergies or rhinitis. 90 tablet 0   furosemide (LASIX) 20 MG tablet Take 1 tablet by mouth daily. May take additional tablet as needed for swelling 90 tablet 3   gabapentin (NEURONTIN) 300 MG capsule Take 1 capsule (300 mg total) by mouth 2 (two) times daily. 90 capsule 2   glipiZIDE (GLUCOTROL XL) 10 MG 24 hr tablet Take 1 tablet (10 mg total) by mouth daily with breakfast. for diabetes. 90 tablet 1  Glucose Blood (BLOOD GLUCOSE TEST STRIPS) STRP 1 each by In Vitro route in the morning, at noon, and at bedtime. May substitute to any manufacturer covered by patient's insurance. 300 strip 5   isosorbide mononitrate (IMDUR) 60 MG 24 hr tablet Take 1.5 tablets (90 mg total) by mouth daily. 135 tablet 1   Lancets Misc. MISC 1 each by Does not apply route in the morning, at noon, and at bedtime. May substitute to any manufacturer covered by patient's insurance. 300 each 5   metoprolol succinate (TOPROL-XL) 100 MG 24 hr tablet TAKE 1 TABLET BY MOUTH EVERY MORNING WITH OR IMMEDIATELY FOLLOWING A MEAL 90 tablet 2   nitroGLYCERIN (NITROSTAT) 0.4 MG SL tablet Place 1 tablet (0.4 mg total) under the tongue every 5 (five) minutes as needed for chest pain. 75 tablet 2   pantoprazole (PROTONIX) 20 MG tablet  TAKE 1 TABLET BY MOUTH DAILY FOR HEARTBURN 90 tablet 2   ROCKLATAN 0.02-0.005 % SOLN Place 1 drop into the right eye at bedtime.     sertraline (ZOLOFT) 100 MG tablet TAKE 1 TABLET BY MOUTH DAILY FOR ANXIETY AND DEPRESSION 90 tablet 2   triamcinolone cream (KENALOG) 0.1 % Apply 1 Application topically 2 (two) times daily. 30 g 1   vitamin B-12 (CYANOCOBALAMIN) 1000 MCG tablet Take 1,000 mcg by mouth daily.     Current Facility-Administered Medications  Medication Dose Route Frequency Provider Last Rate Last Admin   betamethasone acetate-betamethasone sodium phosphate (CELESTONE) injection 3 mg  3 mg Intra-articular Once          Allergies:   Other, Shellfish allergy, Cephalexin, Ciprofloxacin, Clopidogrel bisulfate, Ezetimibe-simvastatin, Lidocaine, Nitrofurantoin, Pregabalin, Celecoxib, Codeine, Codeine phosphate, Hydrocod poli-chlorphe poli er, Propoxyphene, Propoxyphene n-acetaminophen, Rofecoxib, Sulfa antibiotics, Sulfamethoxazole, and Sulfonamide derivatives   Social History:  The patient  reports that she has never smoked. She has never used smokeless tobacco. She reports that she does not drink alcohol and does not use drugs.   Family History:   family history includes Cancer in her mother and sister; Heart attack in her father and sister; Hypertension in her father.    Review of Systems: Review of Systems  Constitutional: Negative.   HENT: Negative.    Respiratory: Negative.    Cardiovascular: Negative.   Gastrointestinal: Negative.   Musculoskeletal: Negative.   Neurological: Negative.   Psychiatric/Behavioral: Negative.    All other systems reviewed and are negative.   PHYSICAL EXAM: VS:  BP (!) 140/86   Pulse 86   Ht 5\' 9"  (1.753 m)   Wt 161 lb 12.8 oz (73.4 kg)   SpO2 96%   BMI 23.89 kg/m  , BMI Body mass index is 23.89 kg/m. GEN: Well nourished, well developed, in no acute distress HEENT: normal Neck: no JVD, carotid bruits, or masses Cardiac: RRR; no  murmurs, rubs, or gallops,no edema  Respiratory:  clear to auscultation bilaterally, normal work of breathing GI: soft, nontender, nondistended, + BS MS: no deformity or atrophy Skin: warm and dry, no rash Neuro:  Strength and sensation are intact Psych: euthymic mood, full affect    Recent Labs: 10/09/2023: B Natriuretic Peptide 363.8; TSH 1.483 12/06/2023: Hemoglobin 11.8; Platelets 244.0 02/08/2024: ALT 7; BUN 19; Creatinine, Ser 1.14; Potassium 4.5; Pro B Natriuretic peptide (BNP) 621.0; Sodium 139    Lipid Panel Lab Results  Component Value Date   CHOL 111 08/24/2023   HDL 33.80 (L) 08/24/2023   LDLCALC 54 02/01/2022   TRIG (H) 08/24/2023    452.0 Triglyceride  is over 400; calculations on Lipids are invalid.      Wt Readings from Last 3 Encounters:  03/05/24 161 lb 12.8 oz (73.4 kg)  02/29/24 166 lb 8 oz (75.5 kg)  02/22/24 167 lb (75.8 kg)       ASSESSMENT AND PLAN:  Problem List Items Addressed This Visit       Cardiology Problems   Essential hypertension   DVT (deep venous thrombosis) (HCC)   Mixed hyperlipidemia   Nonrheumatic aortic valve insufficiency   Other Visit Diagnoses       Coronary artery disease of native artery of native heart with stable angina pectoris (HCC)    -  Primary   Relevant Orders   EKG 12-Lead (Completed)     Atrial fibrillation, unspecified type (HCC)         Hyperlipidemia LDL goal <70          Coronary artery disease with stable angina Currently with no symptoms of angina. No further workup at this time. Continue current medication regimen.  Hyperlipidemia On nexlezit, cholesterol at goal Uses patient assistance typically filled out beginning of the year  Essential hypertension Blood pressure is well controlled on today's visit. No changes made to the medications.  Long-term persistent atrial fibrillation Long discussion concerning various treatment options for her atrial fibrillation She reports that prior primary  cardiologist in [place] told her to not pursue cardioversion She feels she is asymptomatic, echo with normal ejection fraction She is inclined for rate control rather than rhythm control Tolerating Eliquis 5 twice daily currently  Anemia Prior history of GI bleed, hemoglobin stable, iron stable   Signed, Dossie Arbour, M.D., Ph.D. [facility] [place], [place] [phone]

## 2024-03-05 ENCOUNTER — Ambulatory Visit: Payer: Medicare PPO | Attending: Cardiovascular Disease | Admitting: Cardiovascular Disease

## 2024-03-05 ENCOUNTER — Encounter: Payer: Self-pay | Admitting: Cardiovascular Disease

## 2024-03-05 VITALS — BP 140/86 | HR 86 | Ht 69.0 in | Wt 161.8 lb

## 2024-03-05 DIAGNOSIS — I4891 Unspecified atrial fibrillation: Secondary | ICD-10-CM | POA: Diagnosis not present

## 2024-03-05 DIAGNOSIS — R5382 Chronic fatigue, unspecified: Secondary | ICD-10-CM

## 2024-03-05 DIAGNOSIS — I10 Essential (primary) hypertension: Secondary | ICD-10-CM

## 2024-03-05 DIAGNOSIS — I2510 Atherosclerotic heart disease of native coronary artery without angina pectoris: Secondary | ICD-10-CM

## 2024-03-05 DIAGNOSIS — I82491 Acute embolism and thrombosis of other specified deep vein of right lower extremity: Secondary | ICD-10-CM

## 2024-03-05 DIAGNOSIS — E782 Mixed hyperlipidemia: Secondary | ICD-10-CM | POA: Diagnosis not present

## 2024-03-05 DIAGNOSIS — R7989 Other specified abnormal findings of blood chemistry: Secondary | ICD-10-CM

## 2024-03-05 DIAGNOSIS — I351 Nonrheumatic aortic (valve) insufficiency: Secondary | ICD-10-CM

## 2024-03-05 DIAGNOSIS — E785 Hyperlipidemia, unspecified: Secondary | ICD-10-CM | POA: Diagnosis not present

## 2024-03-05 DIAGNOSIS — I25118 Atherosclerotic heart disease of native coronary artery with other forms of angina pectoris: Secondary | ICD-10-CM

## 2024-03-05 MED ORDER — ISOSORBIDE MONONITRATE ER 60 MG PO TB24: 90.0000 mg | ORAL_TABLET | Freq: Every day | ORAL | 3 refills | Status: DC

## 2024-03-05 MED ORDER — APIXABAN 5 MG PO TABS: 5.0000 mg | ORAL_TABLET | Freq: Two times a day (BID) | ORAL | 1 refills | Status: DC

## 2024-03-05 MED ORDER — NITROGLYCERIN 0.4 MG SL SUBL: 0.4000 mg | SUBLINGUAL_TABLET | SUBLINGUAL | 2 refills | Status: DC | PRN

## 2024-03-05 MED ORDER — METOPROLOL SUCCINATE ER 100 MG PO TB24: ORAL_TABLET | ORAL | 3 refills | Status: AC

## 2024-03-05 MED ORDER — FUROSEMIDE 20 MG PO TABS: ORAL_TABLET | ORAL | 3 refills | Status: DC

## 2024-03-05 NOTE — Patient Instructions (Addendum)
 Medication Instructions:  No changes  If you need a refill on your cardiac medications before your next appointment, please call your pharmacy.    Lab work: No new labs needed   Testing/Procedures: No new testing needed   Follow-Up: At [facility], you and your health needs are our priority.  As part of our continuing mission to provide you with exceptional heart care, we have created designated Provider Care Teams.  These Care Teams include your primary Cardiologist (physician) and Advanced Practice Providers (APPs -  Physician Assistants and Nurse Practitioners) who all work together to provide you with the care you need, when you need it.  You will need a follow up appointment in 6 months  Providers on your designated Care Team:   Nicolasa Ducking, NP Eula Listen, PA-C Cadence Fransico Michael, [place]  COVID-19 Vaccine Information can be found at: [email or web address] For questions related to vaccine distribution or appointments, please email [email or web address] or call [phone].

## 2024-03-08 ENCOUNTER — Other Ambulatory Visit: Payer: Self-pay | Admitting: Primary Care

## 2024-03-08 DIAGNOSIS — N1832 Chronic kidney disease, stage 3b: Secondary | ICD-10-CM

## 2024-03-26 DIAGNOSIS — H43813 Vitreous degeneration, bilateral: Secondary | ICD-10-CM | POA: Diagnosis not present

## 2024-03-26 DIAGNOSIS — H04123 Dry eye syndrome of bilateral lacrimal glands: Secondary | ICD-10-CM | POA: Diagnosis not present

## 2024-03-26 DIAGNOSIS — H4311 Vitreous hemorrhage, right eye: Secondary | ICD-10-CM | POA: Diagnosis not present

## 2024-03-26 DIAGNOSIS — H401133 Primary open-angle glaucoma, bilateral, severe stage: Secondary | ICD-10-CM | POA: Diagnosis not present

## 2024-04-25 DIAGNOSIS — Z85828 Personal history of other malignant neoplasm of skin: Secondary | ICD-10-CM | POA: Diagnosis not present

## 2024-04-25 DIAGNOSIS — D225 Melanocytic nevi of trunk: Secondary | ICD-10-CM | POA: Diagnosis not present

## 2024-04-25 DIAGNOSIS — Z08 Encounter for follow-up examination after completed treatment for malignant neoplasm: Secondary | ICD-10-CM | POA: Diagnosis not present

## 2024-04-25 DIAGNOSIS — L821 Other seborrheic keratosis: Secondary | ICD-10-CM | POA: Diagnosis not present

## 2024-04-25 DIAGNOSIS — R208 Other disturbances of skin sensation: Secondary | ICD-10-CM | POA: Diagnosis not present

## 2024-04-25 DIAGNOSIS — D485 Neoplasm of uncertain behavior of skin: Secondary | ICD-10-CM | POA: Diagnosis not present

## 2024-04-25 DIAGNOSIS — D045 Carcinoma in situ of skin of trunk: Secondary | ICD-10-CM | POA: Diagnosis not present

## 2024-04-25 DIAGNOSIS — D2261 Melanocytic nevi of right upper limb, including shoulder: Secondary | ICD-10-CM | POA: Diagnosis not present

## 2024-04-25 DIAGNOSIS — L820 Inflamed seborrheic keratosis: Secondary | ICD-10-CM | POA: Diagnosis not present

## 2024-04-25 DIAGNOSIS — D2262 Melanocytic nevi of left upper limb, including shoulder: Secondary | ICD-10-CM | POA: Diagnosis not present

## 2024-04-25 DIAGNOSIS — L570 Actinic keratosis: Secondary | ICD-10-CM | POA: Diagnosis not present

## 2024-04-30 ENCOUNTER — Other Ambulatory Visit: Payer: Self-pay | Admitting: Primary Care

## 2024-04-30 DIAGNOSIS — E0821 Diabetes mellitus due to underlying condition with diabetic nephropathy: Secondary | ICD-10-CM

## 2024-05-09 DIAGNOSIS — L821 Other seborrheic keratosis: Secondary | ICD-10-CM | POA: Diagnosis not present

## 2024-05-09 DIAGNOSIS — L538 Other specified erythematous conditions: Secondary | ICD-10-CM | POA: Diagnosis not present

## 2024-05-09 DIAGNOSIS — D045 Carcinoma in situ of skin of trunk: Secondary | ICD-10-CM | POA: Diagnosis not present

## 2024-05-09 DIAGNOSIS — L2989 Other pruritus: Secondary | ICD-10-CM | POA: Diagnosis not present

## 2024-05-09 DIAGNOSIS — L820 Inflamed seborrheic keratosis: Secondary | ICD-10-CM | POA: Diagnosis not present

## 2024-05-18 DIAGNOSIS — Z1231 Encounter for screening mammogram for malignant neoplasm of breast: Secondary | ICD-10-CM | POA: Diagnosis not present

## 2024-05-18 LAB — HM MAMMOGRAPHY

## 2024-05-20 NOTE — Progress Notes (Deleted)
     Arelis Neumeier T. Javarion Douty, MD, CAQ Sports Medicine [facility] [address] [place], [address]  Phone: [phone]  FAX: [phone]  [name] [name] - 89 y.o. female  MRN [id number]  Date of Birth: 08/10/1932  Date: 05/23/2024  PCP: Gretta Comer POUR, NP  Referral: Gretta Comer POUR, NP  No chief complaint on file.  Subjective:   [name] [name] is a 89 y.o. very pleasant female patient with There is no height or weight on file to calculate BMI. who presents with the following:  The patient presents for evaluation of ongoing knee pain, previously saw her at the beginning of April for right-sided knee pain and osteoarthritis.    Review of Systems is noted in the HPI, as appropriate  Objective:   There were no vitals taken for this visit.  GEN: No acute distress; alert,appropriate. PULM: Breathing comfortably in no respiratory distress PSYCH: Normally interactive.   Laboratory and Imaging Data:  Assessment and Plan:   ***

## 2024-05-21 ENCOUNTER — Encounter: Payer: Self-pay | Admitting: Primary Care

## 2024-05-23 ENCOUNTER — Ambulatory Visit: Admitting: Family Medicine

## 2024-05-23 ENCOUNTER — Ambulatory Visit: Admitting: Primary Care

## 2024-05-23 ENCOUNTER — Encounter: Payer: Self-pay | Admitting: Primary Care

## 2024-05-23 ENCOUNTER — Ambulatory Visit: Payer: Self-pay | Admitting: Primary Care

## 2024-05-23 VITALS — BP 126/68 | HR 84 | Temp 97.8°F | Ht 69.0 in | Wt 174.0 lb

## 2024-05-23 DIAGNOSIS — R0683 Snoring: Secondary | ICD-10-CM | POA: Diagnosis not present

## 2024-05-23 DIAGNOSIS — N3941 Urge incontinence: Secondary | ICD-10-CM | POA: Diagnosis not present

## 2024-05-23 DIAGNOSIS — R5382 Chronic fatigue, unspecified: Secondary | ICD-10-CM

## 2024-05-23 DIAGNOSIS — R600 Localized edema: Secondary | ICD-10-CM

## 2024-05-23 DIAGNOSIS — R350 Frequency of micturition: Secondary | ICD-10-CM | POA: Diagnosis not present

## 2024-05-23 DIAGNOSIS — K529 Noninfective gastroenteritis and colitis, unspecified: Secondary | ICD-10-CM

## 2024-05-23 DIAGNOSIS — M1711 Unilateral primary osteoarthritis, right knee: Secondary | ICD-10-CM

## 2024-05-23 LAB — POC URINALSYSI DIPSTICK (AUTOMATED)
Bilirubin, UA: NEGATIVE
Blood, UA: NEGATIVE
Glucose, UA: NEGATIVE
Ketones, UA: NEGATIVE
Leukocytes, UA: NEGATIVE
Nitrite, UA: NEGATIVE
Protein, UA: POSITIVE — AB
Spec Grav, UA: 1.02 (ref 1.010–1.025)
Urobilinogen, UA: 0.2 U/dL
pH, UA: 5.5 (ref 5.0–8.0)

## 2024-05-23 LAB — BRAIN NATRIURETIC PEPTIDE: Pro B Natriuretic peptide (BNP): 594 pg/mL — ABNORMAL HIGH (ref 0.0–100.0)

## 2024-05-23 LAB — CBC
HCT: 29 % — ABNORMAL LOW (ref 36.0–46.0)
Hemoglobin: 9.6 g/dL — ABNORMAL LOW (ref 12.0–15.0)
MCHC: 32.9 g/dL (ref 30.0–36.0)
MCV: 92.1 fl (ref 78.0–100.0)
Platelets: 244 10*3/uL (ref 150.0–400.0)
RBC: 3.15 Mil/uL — ABNORMAL LOW (ref 3.87–5.11)
RDW: 13.8 % (ref 11.5–15.5)
WBC: 3 10*3/uL — ABNORMAL LOW (ref 4.0–10.5)

## 2024-05-23 LAB — IBC + FERRITIN
Ferritin: 10.5 ng/mL (ref 10.0–291.0)
Iron: 109 ug/dL (ref 42–145)
Saturation Ratios: 26 % (ref 20.0–50.0)
TIBC: 418.6 ug/dL (ref 250.0–450.0)
Transferrin: 299 mg/dL (ref 212.0–360.0)

## 2024-05-23 NOTE — Assessment & Plan Note (Signed)
 Unclear why she discontinued Myrbetriq  50 mg daily.  Will await lab results today as she may need to resume furosemide . Consider new prescription for Myrbetriq  25 mg daily

## 2024-05-23 NOTE — Assessment & Plan Note (Signed)
 Evident on exam today.  Differentials include CHF, chronic venous insufficiency. Lower suspicion for DVT given presentation today and management on apixaban .  Labs pending today for BMP, BNP.   Recommended she start taking furosemide  20 mg daily as needed for lower extremity edema as per cardiology. Will await BNP.

## 2024-05-23 NOTE — Patient Instructions (Addendum)
 Stop by the lab prior to leaving today. I will notify you of your results once received.   Return the stool sample when able.  You will either be contacted via phone regarding your referral to pulmonology, or you may receive a letter on your MyChart portal from our referral team with instructions for scheduling an appointment. Please let us  know if you have not been contacted by anyone within two weeks.  It was a pleasure to see you today!

## 2024-05-23 NOTE — Assessment & Plan Note (Signed)
 UA today negative for infection.  Suspect symptoms are secondary to overactive bladder. Will treat but await lab results.

## 2024-05-23 NOTE — Assessment & Plan Note (Signed)
 Checking labs including stool studies, alpha gal, food allergy panel, BMP.  She will also start keeping a journal of her symptoms. Consider GI referral

## 2024-05-23 NOTE — Progress Notes (Signed)
 Subjective:    Patient ID: [name] [name], female    DOB: 13-Aug-1932, 89 y.o.   MRN: [id number]  Urinary Frequency  Associated symptoms include frequency. Pertinent negatives include no hematuria, nausea or vomiting.    [name] [name] is a very pleasant 89 y.o. female with a significant medical history including hypertension, CAD, mitral regurgitation, aortic valve insufficiency, DVT, persistent A-fib, type 2 diabetes, recurrent UTI, renal insufficiency, chronic fatigue, bilateral lower extremity edema, chronic knee pain, overactive bladder who presents today to discuss several concerns.  1) Lower Extremity Edema: Chronic to the right lower extremity since her chronic knee osteoarthritis years ago. Over the last 3 to 4 weeks she has noticed left lower extremity swelling to distal to her knee to her toes. This is new.   Her lower extremity edema improves when waking in the morning but increases throughout the day. She does wear compression socks which sometimes helps, sometimes not. She spends most of her day sitting. Sometimes elevates her legs with sitting. She does not take furosemide  20 mg due to urinary frequency.   2) Urinary Frequency: Chronic history of overactive bladder.  Previously treated with oxybutynin  XL 5 mg daily which was ineffective. More recently she has been prescribed Myrbetriq  25 mg and 50 mg doses, she's not sure why she hasn't been taking.  Over the last 6 months she's been getting up to urinate every 2 hours during the night.  She does urinate a lot during the day. She's also experienced leakage of urine, sometimes doesn't make it.   She drinks 1 cup of coffee during the morning, stops drinking liquids around 8 pm, goes to bed around 9-9:30pm.  3) Chronic Fatigue: Chronic for years. She does snore, wakes during the night multiple times during the night to urinate. No one has witnessed periods of apnea. She has daytime tiredness, could take a nap at any point in the day. She  has never completed a sleep study.  She is managed on metoprolol  and gabapentin . Also with a history of iron deficiency anemia.   4) Chronic Diarrhea: Chronic for the last 1 year. She will have a solid bowel movement in the morning, then will experience 3-4 episodes of liquid diarrhea throughout the day. This will occur every 1-2 weeks. Sometimes she will go one week without diarrhea.   She denies tick bites, bloody stools.    Review of Systems  Constitutional:  Negative for fever.  Gastrointestinal:  Positive for diarrhea. Negative for abdominal pain, blood in stool, nausea and vomiting.  Genitourinary:  Positive for frequency. Negative for dysuria and hematuria.         Past Medical History:  Diagnosis Date   Acute bilateral thoracic back pain 08/16/2019   Acute post-hemorrhagic anemia 10/10/2023   Allergy    Cipro, Plavix, Statins   Anemia    Arthritis    Atrial fibrillation (HCC)    CAD (coronary artery disease)    a. s/p tandem Promus DES to LAD in 2009 by Dr. Obie;  b.  LHC (03/22/14):  prox LAD 30%, mid LAD 40%, LAD stent ok with dist 20% ISR, apical LAD occluded with L-L collats filling apical vessel (too small for PCI), mid RCA 20%, EF 70%.  Med Rx   Cancer [facility])    Chronic kidney disease, stage 3b (HCC)    Colon polyps    Diabetes mellitus    Diagnosed 2012   Diverticulosis of colon (without mention of hemorrhage)    DVT (  deep venous thrombosis) (HCC) 12/2022   right leg   GERD (gastroesophageal reflux disease)    Glaucoma    Grade I diastolic dysfunction    Grade I diastolic dysfunction    Herpes zoster without complication 04/24/2020   Hyperlipidemia    intol of statins   Hypertension    Kidney stones    Left atrial dilation    Moderate aortic regurgitation    Motion sickness    back seat cars   NHL (non-Hodgkin's lymphoma) (HCC) dx'd 2003   chemo/xrt comp 2003   Personal history of colonic polyps    Proctitis    Pruritic intertrigo 07/18/2014    Urticaria    Wears dentures    Full upper, partial lower   Wears hearing aid in both ears     Social History   Socioeconomic History   Marital status: Widowed    Spouse name: Not on file   Number of children: 0   Years of education: Not on file   Highest education level: 12th grade  Occupational History   Occupation: Retired    Associate Professor: RETIRED   Occupation: retired  Tobacco Use   Smoking status: Never   Smokeless tobacco: Never  Vaping Use   Vaping status: Never Used  Substance and Sexual Activity   Alcohol use: No   Drug use: No   Sexual activity: Not Currently  Other Topics Concern   Not on file  Social History Narrative   Not on file   Social Drivers of Health   Financial Resource Strain: Low Risk  (05/20/2024)   Overall Financial Resource Strain (CARDIA)    Difficulty of Paying Living Expenses: Not hard at all  Food Insecurity: No Food Insecurity (05/20/2024)   Hunger Vital Sign    Worried About Running Out of Food in the Last Year: Never true    Ran Out of Food in the Last Year: Never true  Transportation Needs: No Transportation Needs (05/20/2024)   PRAPARE - Administrator, Civil Service (Medical): No    Lack of Transportation (Non-Medical): No  Physical Activity: Inactive (05/20/2024)   Exercise Vital Sign    Days of Exercise per Week: 0 days    Minutes of Exercise per Session: Not on file  Stress: No Stress Concern Present (05/20/2024)   [employer] of Occupational Health - Occupational Stress Questionnaire    Feeling of Stress: Not at all  Social Connections: Moderately Integrated (05/20/2024)   Social Connection and Isolation Panel    Frequency of Communication with Friends and Family: More than three times a week    Frequency of Social Gatherings with Friends and Family: More than three times a week    Attends Religious Services: More than 4 times per year    Active Member of [employer] or Organizations: Yes    Attends Banker  Meetings: More than 4 times per year    Marital Status: Widowed  Intimate Partner Violence: Not At Risk (10/26/2023)   Humiliation, Afraid, Rape, and Kick questionnaire    Fear of Current or Ex-Partner: No    Emotionally Abused: No    Physically Abused: No    Sexually Abused: No    Past Surgical History:  Procedure Laterality Date   BIOPSY  10/15/2023   Procedure: BIOPSY;  Surgeon: Aundria, Ladell POUR, MD;  Location: [facility] ENDOSCOPY;  Service: Gastroenterology;;   cardiac cath-neg     CATARACT EXTRACTION Bilateral    COLONOSCOPY WITH PROPOFOL  N/A 10/15/2023  Procedure: COLONOSCOPY WITH PROPOFOL ;  Surgeon: Toledo, Ladell POUR, MD;  Location: [facility] ENDOSCOPY;  Service: Gastroenterology;  Laterality: N/A;   CORONARY ANGIOPLASTY WITH STENT PLACEMENT     x 2   dexa-neg     ESOPHAGOGASTRODUODENOSCOPY (EGD) WITH PROPOFOL  N/A 10/15/2023   Procedure: ESOPHAGOGASTRODUODENOSCOPY (EGD) WITH PROPOFOL ;  Surgeon: Toledo, Ladell POUR, MD;  Location: [facility] ENDOSCOPY;  Service: Gastroenterology;  Laterality: N/A;   HEMOSTASIS CLIP PLACEMENT  10/15/2023   Procedure: HEMOSTASIS CLIP PLACEMENT;  Surgeon: Aundria, Ladell POUR, MD;  Location: [facility] ENDOSCOPY;  Service: Gastroenterology;;   KNEE SURGERY  10/30/2003   left   LEFT HEART CATHETERIZATION WITH CORONARY ANGIOGRAM N/A 03/22/2014   Procedure: LEFT HEART CATHETERIZATION WITH CORONARY ANGIOGRAM;  Surgeon: Lonni JONETTA Cash, MD;  Location: [facility] CATH LAB;  Service: Cardiovascular;  Laterality: N/A;   POLYPECTOMY  10/15/2023   Procedure: POLYPECTOMY;  Surgeon: Toledo, Ladell POUR, MD;  Location: [facility] ENDOSCOPY;  Service: Gastroenterology;;   stress cardiolite      VAGINAL HYSTERECTOMY     partial, fibroids, one ovary left    Family History  Problem Relation Age of Onset   Cancer Mother        jaw   Hypertension Father    Heart attack Father    Heart attack Sister    Cancer Sister        unknown primary-mets throughout body   Colon cancer Neg Hx     Esophageal cancer Neg Hx    Pancreatic cancer Neg Hx    Stomach cancer Neg Hx    Liver disease Neg Hx     Allergies  Allergen Reactions   Other Rash and Anaphylaxis   Shellfish Allergy Anaphylaxis and Rash   Cephalexin      REACTION: trash and swelling   Ciprofloxacin     REACTION: u/k   Clopidogrel Bisulfate     REACTION: swelling, rash   Ezetimibe -Simvastatin     REACTION: myalgias   Lidocaine  Hives    ONLY TO ADHESIVE LIDOCAINE  PATCHES. NO PROBLEM WITH INJECTABLE LIDOCAINE .   Nitrofurantoin     REACTION: panama   Pregabalin     REACTION: felt bad   Celecoxib Rash    REACTION: uk   Codeine Rash   Codeine Phosphate Rash   Hydrocod Poli-Chlorphe Poli Er Rash   Propoxyphene Rash   Propoxyphene N-Acetaminophen  Rash   Rofecoxib Rash   Sulfa Antibiotics Rash   Sulfamethoxazole Rash   Sulfonamide Derivatives Rash    Current Outpatient Medications on File Prior to Visit  Medication Sig Dispense Refill   apixaban  (ELIQUIS ) 5 MG TABS tablet Take 1 tablet (5 mg total) by mouth 2 (two) times daily. 180 tablet 1   Bempedoic Acid-Ezetimibe  (NEXLIZET ) 180-10 MG TABS Take 1 tablet by mouth daily. Pt has scheduled an office visit with provider in April, 2025 90 tablet 2   Blood Glucose Monitoring Suppl DEVI 1 each by Does not apply route in the morning, at noon, and at bedtime. May substitute to any manufacturer covered by patient's insurance. 1 each 0   brimonidine  (ALPHAGAN ) 0.2 % ophthalmic solution Place 1 drop into the right eye in the morning and at bedtime.     Cholecalciferol (VITAMIN D -3) 1000 UNITS CAPS Take 1 capsule by mouth daily.     CRANBERRY PO Take 1 tablet by mouth daily.     diclofenac  Sodium (VOLTAREN ) 1 % GEL APPLY 2 GRAMS TOPICALLY THREE TIMES A DAY AS NEEDED 100 g 2   Dulaglutide  (TRULICITY ) 0.75 MG/0.5ML SOAJ  Inject 0.75 mg into the skin once a week. for diabetes. 6 mL 1   fexofenadine  (ALLEGRA ) 180 MG tablet Take 1 tablet (180 mg total) by mouth as needed for  allergies or rhinitis. 90 tablet 0   glipiZIDE  (GLUCOTROL  XL) 10 MG 24 hr tablet TAKE 1 TABLET BY MOUTH EVERY MORNING WITH BREAKFAST FOR DIABETES 90 tablet 1   Glucose Blood (BLOOD GLUCOSE TEST STRIPS) STRP 1 each by In Vitro route in the morning, at noon, and at bedtime. May substitute to any manufacturer covered by patient's insurance. 300 strip 5   isosorbide  mononitrate (IMDUR ) 60 MG 24 hr tablet Take 1.5 tablets (90 mg total) by mouth daily. 135 tablet 3   Lancets Misc. MISC 1 each by Does not apply route in the morning, at noon, and at bedtime. May substitute to any manufacturer covered by patient's insurance. 300 each 5   metoprolol  succinate (TOPROL -XL) 100 MG 24 hr tablet TAKE 1 TABLET BY MOUTH EVERY MORNING WITH OR IMMEDIATELY FOLLOWING A MEAL 90 tablet 3   nitroGLYCERIN  (NITROSTAT ) 0.4 MG SL tablet Place 1 tablet (0.4 mg total) under the tongue every 5 (five) minutes as needed for chest pain. 25 tablet 2   pantoprazole  (PROTONIX ) 20 MG tablet TAKE 1 TABLET BY MOUTH DAILY FOR HEARTBURN 90 tablet 2   ROCKLATAN 0.02-0.005 % SOLN Place 1 drop into the right eye at bedtime.     sertraline  (ZOLOFT ) 100 MG tablet TAKE 1 TABLET BY MOUTH DAILY FOR ANXIETY AND DEPRESSION 90 tablet 2   triamcinolone  cream (KENALOG ) 0.1 % Apply 1 Application topically 2 (two) times daily. 30 g 1   vitamin B-12 (CYANOCOBALAMIN) 1000 MCG tablet Take 1,000 mcg by mouth daily.     ferrous sulfate  325 (65 FE) MG tablet Take 1 tablet (325 mg total) by mouth daily with breakfast. (Patient not taking: Reported on 05/23/2024) 30 tablet 2   furosemide  (LASIX ) 20 MG tablet Take 1 tablet by mouth daily. May take additional tablet as needed for swelling (Patient not taking: Reported on 05/23/2024) 180 tablet 3   gabapentin  (NEURONTIN ) 300 MG capsule Take 1 capsule (300 mg total) by mouth 2 (two) times daily. 90 capsule 2   [DISCONTINUED] loratadine  (CLARITIN ) 10 MG tablet Take 10 mg by mouth as needed.      Current  Facility-Administered Medications on File Prior to Visit  Medication Dose Route Frequency Provider Last Rate Last Admin   betamethasone  acetate-betamethasone  sodium phosphate (CELESTONE ) injection 3 mg  3 mg Intra-articular Once         BP 126/68   Pulse 84   Temp 97.8 F (36.6 C) (Temporal)   Ht 5' 9 (1.753 m)   Wt 174 lb (78.9 kg)   SpO2 98%   BMI 25.70 kg/m  Objective:   Physical Exam  Cardiovascular:     Rate and Rhythm: Normal rate. Rhythm irregular.     Pulses:          Dorsalis pedis pulses are 2+ on the right side and 2+ on the left side.  Pulmonary:     Effort: Pulmonary effort is normal.     Breath sounds: Normal breath sounds.  Abdominal:     Palpations: Abdomen is soft.     Tenderness: There is no abdominal tenderness.   Musculoskeletal:     Cervical back: Neck supple.     Right lower leg: 2+ Edema present.     Left lower leg: 2+ Edema present.   Skin:  General: Skin is warm and dry.   Neurological:     Mental Status: She is alert and oriented to person, place, and time.   Psychiatric:        Mood and Affect: Mood normal.           Assessment & Plan:  Chronic diarrhea Assessment & Plan: Checking labs including stool studies, alpha gal, food allergy panel, BMP.  She will also start keeping a journal of her symptoms. Consider GI referral   Orders: -     Giardia antigen -     Gastrointestinal Pathogen Pnl RT, PCR -     C. difficile GDH and Toxin A/B -     Alpha-Gal Panel -     Food Allergy Profile  Chronic fatigue Assessment & Plan: Ongoing.   Differentials include medication side effects, undiagnosed sleep apnea, iron deficiency anemia, inactivity, other metabolic cause.  Checking labs today including CBC, iron studies, BNP. Referral placed to pulmonology for sleep study given strong suspicion for sleep apnea.  Orders: -     IBC + Ferritin -     CBC -     Pulmonary Visit  Bilateral lower extremity edema Assessment &  Plan: Evident on exam today.  Differentials include CHF, chronic venous insufficiency. Lower suspicion for DVT given presentation today and management on apixaban .  Labs pending today for BMP, BNP.   Recommended she start taking furosemide  20 mg daily as needed for lower extremity edema as per cardiology. Will await BNP.   Orders: -     Brain natriuretic peptide  Urinary frequency Assessment & Plan: UA today negative for infection.  Suspect symptoms are secondary to overactive bladder. Will treat but await lab results.  Orders: -     POCT Urinalysis Dipstick (Automated)  Snoring -     Pulmonary Visit  Urge incontinence of urine Assessment & Plan: Unclear why she discontinued Myrbetriq  50 mg daily.  Will await lab results today as she may need to resume furosemide . Consider new prescription for Myrbetriq  25 mg daily    40 minutes was spent with patient face-to-face, reviewing chart, developing assessment and plan for her multiple symptoms.  See assessment and plan for documentation.     Teng Decou K Aland Chestnutt, NP

## 2024-05-23 NOTE — Addendum Note (Signed)
 Addended by: HOPE VEVA PARAS on: 05/23/2024 10:52 AM   Modules accepted: Orders

## 2024-05-23 NOTE — Assessment & Plan Note (Signed)
 Ongoing.   Differentials include medication side effects, undiagnosed sleep apnea, iron deficiency anemia, inactivity, other metabolic cause.  Checking labs today including CBC, iron studies, BNP. Referral placed to pulmonology for sleep study given strong suspicion for sleep apnea.

## 2024-05-24 ENCOUNTER — Ambulatory Visit: Admitting: Family Medicine

## 2024-05-24 ENCOUNTER — Encounter: Payer: Self-pay | Admitting: Family Medicine

## 2024-05-24 ENCOUNTER — Other Ambulatory Visit: Payer: Self-pay | Admitting: Radiology

## 2024-05-24 VITALS — BP 130/72 | HR 90 | Temp 97.4°F | Ht 69.0 in | Wt 174.2 lb

## 2024-05-24 DIAGNOSIS — K529 Noninfective gastroenteritis and colitis, unspecified: Secondary | ICD-10-CM

## 2024-05-24 DIAGNOSIS — M170 Bilateral primary osteoarthritis of knee: Secondary | ICD-10-CM

## 2024-05-24 MED ORDER — TRIAMCINOLONE ACETONIDE 40 MG/ML IJ SUSP
40.0000 mg | Freq: Once | INTRAMUSCULAR | Status: AC
Start: 2024-05-24 — End: 2024-05-24
  Administered 2024-05-24: 40 mg via INTRA_ARTICULAR

## 2024-05-24 MED ORDER — TRIAMCINOLONE ACETONIDE 40 MG/ML IJ SUSP
40.0000 mg | Freq: Once | INTRAMUSCULAR | Status: AC
  Administered 2024-05-24: 40 mg via INTRA_ARTICULAR

## 2024-05-24 NOTE — Progress Notes (Signed)
 [name] Shibuya T. [name], MD, CAQ Sports Medicine [facility] [address] [place], [address]  Phone: [phone]  FAX: [phone]  [name] [name] - 89 y.o. female  MRN [id number]  Date of Birth: 09-18-1932  Date: 05/24/2024  PCP: Gretta Comer POUR, NP  Referral: Gretta Comer POUR, NP  Chief Complaint  Patient presents with   Knee Pain    Injections      ICD-10-CM   1. Primary osteoarthritis of knees, bilateral  M17.0      Follow-up bilateral knee osteoarthritis, procedure only.  Aspiration/Injection Procedure Note [name] [name] 12-12-1931 Date of procedure: 05/24/2024  Procedure: Large Joint Joint Aspiration / Injection of the Right Knee Indications: Pain  Procedure Details Patient verbally consented to procedure. Risks, benefits, and alternatives explained. Sterilely prepped with Chloraprep. Ethyl cholride used for anesthesia. 9 cc Lidocaine  1% mixed with 1 mL Kenalog  40 mg injected using the anteromedial approach without difficulty. No complications with procedure and tolerated well. Patient had decreased pain post-injection.  Medication: 1 mL of Kenalog  40 mg  Aspiration/Injection Procedure Note [name] [name] [birth year] Date of procedure: 05/24/2024  Procedure: Large Joint Aspiration / Injection of the Left Knee Indications: Pain  Procedure Details Patient verbally consented to procedure. Risks, benefits, and alternatives explained. Sterilely prepped with Chloraprep. Ethyl cholride used for anesthesia. 9 cc Lidocaine  1% mixed with 1 mL Kenalog  40 mg injected using the anteromedial approach without difficulty. No complications with procedure and tolerated well. Patient had decreased pain post-injection.  Medication: 1 mL of Kenalog  40 mg   Medication Management during today's office visit: No orders of the defined types were placed in this encounter.  There are no discontinued medications.  Orders placed today for  conditions managed today: No orders of the defined types were placed in this encounter.   Disposition: No follow-ups on file.  [employer] One speech-to-text software was used for transcription in this dictation.  Possible transcriptional errors can occur using Animal nutritionist.   Signed,  Jacques DASEN. [name], MD   Outpatient Encounter Medications as of 05/24/2024  Medication Sig   apixaban  (ELIQUIS ) 5 MG TABS tablet Take 1 tablet (5 mg total) by mouth 2 (two) times daily.   Bempedoic Acid-Ezetimibe  (NEXLIZET ) 180-10 MG TABS Take 1 tablet by mouth daily. Pt has scheduled an office visit with provider in April, 2025   Blood Glucose Monitoring Suppl DEVI 1 each by Does not apply route in the morning, at noon, and at bedtime. May substitute to any manufacturer covered by patient's insurance.   brimonidine  (ALPHAGAN ) 0.2 % ophthalmic solution Place 1 drop into the right eye in the morning and at bedtime.   Cholecalciferol (VITAMIN D -3) 1000 UNITS CAPS Take 1 capsule by mouth daily.   CRANBERRY PO Take 1 tablet by mouth daily.   diclofenac  Sodium (VOLTAREN ) 1 % GEL APPLY 2 GRAMS TOPICALLY THREE TIMES A DAY AS NEEDED   Dulaglutide  (TRULICITY ) 0.75 MG/0.5ML SOAJ Inject 0.75 mg into the skin once a week. for diabetes.   ferrous sulfate  325 (65 FE) MG tablet Take 1 tablet (325 mg total) by mouth daily with breakfast.   fexofenadine  (ALLEGRA ) 180 MG tablet Take 1 tablet (180 mg total) by mouth as needed for allergies or rhinitis.   furosemide  (LASIX ) 20 MG tablet Take 1 tablet by mouth daily. May take additional tablet as needed for swelling   gabapentin  (NEURONTIN ) 300 MG capsule Take 1 capsule (300 mg total) by mouth  2 (two) times daily.   glipiZIDE  (GLUCOTROL  XL) 10 MG 24 hr tablet TAKE 1 TABLET BY MOUTH EVERY MORNING WITH BREAKFAST FOR DIABETES   Glucose Blood (BLOOD GLUCOSE TEST STRIPS) STRP 1 each by In Vitro route in the morning, at noon, and at bedtime. May substitute to any manufacturer  covered by patient's insurance.   isosorbide  mononitrate (IMDUR ) 60 MG 24 hr tablet Take 1.5 tablets (90 mg total) by mouth daily.   Lancets Misc. MISC 1 each by Does not apply route in the morning, at noon, and at bedtime. May substitute to any manufacturer covered by patient's insurance.   metoprolol  succinate (TOPROL -XL) 100 MG 24 hr tablet TAKE 1 TABLET BY MOUTH EVERY MORNING WITH OR IMMEDIATELY FOLLOWING A MEAL   nitroGLYCERIN  (NITROSTAT ) 0.4 MG SL tablet Place 1 tablet (0.4 mg total) under the tongue every 5 (five) minutes as needed for chest pain.   pantoprazole  (PROTONIX ) 20 MG tablet TAKE 1 TABLET BY MOUTH DAILY FOR HEARTBURN   ROCKLATAN 0.02-0.005 % SOLN Place 1 drop into the right eye at bedtime.   sertraline  (ZOLOFT ) 100 MG tablet TAKE 1 TABLET BY MOUTH DAILY FOR ANXIETY AND DEPRESSION   triamcinolone  cream (KENALOG ) 0.1 % Apply 1 Application topically 2 (two) times daily.   vitamin B-12 (CYANOCOBALAMIN) 1000 MCG tablet Take 1,000 mcg by mouth daily.   [DISCONTINUED] loratadine  (CLARITIN ) 10 MG tablet Take 10 mg by mouth as needed.    Facility-Administered Encounter Medications as of 05/24/2024  Medication   betamethasone  acetate-betamethasone  sodium phosphate (CELESTONE ) injection 3 mg

## 2024-05-25 LAB — C. DIFFICILE GDH AND TOXIN A/B
GDH ANTIGEN: NOT DETECTED
MICRO NUMBER:: 16629311
SPECIMEN QUALITY:: ADEQUATE
TOXIN A AND B: NOT DETECTED

## 2024-05-27 LAB — FOOD ALLERGY PROFILE

## 2024-05-27 LAB — ALPHA-GAL PANEL
Allergen, Mutton, f88: <0.1 kU/L
Allergen, Pork, f26: <0.1 kU/L
Beef: <0.1 kU/L
CLASS: 0
CLASS: 0
Class: 0
GALACTOSE-ALPHA-1,3-GALACTOSE IGE*: <0.1 kU/L (ref <0.10)

## 2024-05-27 LAB — INTERPRETATION:

## 2024-05-29 DIAGNOSIS — Z961 Presence of intraocular lens: Secondary | ICD-10-CM | POA: Diagnosis not present

## 2024-05-29 DIAGNOSIS — H43813 Vitreous degeneration, bilateral: Secondary | ICD-10-CM | POA: Diagnosis not present

## 2024-05-29 DIAGNOSIS — E119 Type 2 diabetes mellitus without complications: Secondary | ICD-10-CM | POA: Diagnosis not present

## 2024-05-29 DIAGNOSIS — H401133 Primary open-angle glaucoma, bilateral, severe stage: Secondary | ICD-10-CM | POA: Diagnosis not present

## 2024-05-29 LAB — GIARDIA ANTIGEN
MICRO NUMBER:: 16629241
RESULT:: NOT DETECTED
SPECIMEN QUALITY:: ADEQUATE

## 2024-05-29 LAB — GASTROINTESTINAL PATHOGEN PNL
CampyloBacter Group: NOT DETECTED
Norovirus GI/GII: NOT DETECTED
Rotavirus A: NOT DETECTED
Salmonella species: NOT DETECTED
Shiga Toxin 1: NOT DETECTED
Shiga Toxin 2: NOT DETECTED
Shigella Species: NOT DETECTED
Vibrio Group: NOT DETECTED
Yersinia enterocolitica: NOT DETECTED

## 2024-06-06 ENCOUNTER — Other Ambulatory Visit
Admission: RE | Admit: 2024-06-06 | Discharge: 2024-06-06 | Disposition: A | Discharge status: Home / Self Care | Payer: Self-pay | Source: Ambulatory Visit | Attending: Ophthalmology | Admitting: Ophthalmology

## 2024-06-06 DIAGNOSIS — S0501XA Injury of conjunctiva and corneal abrasion without foreign body, right eye, initial encounter: Secondary | ICD-10-CM | POA: Diagnosis not present

## 2024-06-06 DIAGNOSIS — H169 Unspecified keratitis: Secondary | ICD-10-CM | POA: Diagnosis not present

## 2024-06-07 DIAGNOSIS — H169 Unspecified keratitis: Secondary | ICD-10-CM | POA: Diagnosis not present

## 2024-06-08 DIAGNOSIS — H169 Unspecified keratitis: Secondary | ICD-10-CM | POA: Diagnosis not present

## 2024-06-09 DIAGNOSIS — H169 Unspecified keratitis: Secondary | ICD-10-CM | POA: Diagnosis not present

## 2024-06-11 ENCOUNTER — Ambulatory Visit: Payer: Self-pay | Admitting: Sleep Medicine

## 2024-06-11 DIAGNOSIS — H169 Unspecified keratitis: Secondary | ICD-10-CM | POA: Diagnosis not present

## 2024-06-11 LAB — EYE CULTURE W GRAM STAIN

## 2024-06-13 DIAGNOSIS — H169 Unspecified keratitis: Secondary | ICD-10-CM | POA: Diagnosis not present

## 2024-06-18 ENCOUNTER — Telehealth: Payer: Self-pay | Admitting: Sleep Medicine

## 2024-06-18 DIAGNOSIS — H169 Unspecified keratitis: Secondary | ICD-10-CM | POA: Diagnosis not present

## 2024-06-18 DIAGNOSIS — E119 Type 2 diabetes mellitus without complications: Secondary | ICD-10-CM | POA: Diagnosis not present

## 2024-06-18 DIAGNOSIS — H401133 Primary open-angle glaucoma, bilateral, severe stage: Secondary | ICD-10-CM | POA: Diagnosis not present

## 2024-06-18 DIAGNOSIS — H4311 Vitreous hemorrhage, right eye: Secondary | ICD-10-CM | POA: Diagnosis not present

## 2024-06-18 NOTE — Telephone Encounter (Signed)
 LVMTCB to schedule sleep consult.

## 2024-06-21 DIAGNOSIS — H401133 Primary open-angle glaucoma, bilateral, severe stage: Secondary | ICD-10-CM | POA: Diagnosis not present

## 2024-06-21 DIAGNOSIS — H169 Unspecified keratitis: Secondary | ICD-10-CM | POA: Diagnosis not present

## 2024-06-21 DIAGNOSIS — Z961 Presence of intraocular lens: Secondary | ICD-10-CM | POA: Diagnosis not present

## 2024-06-29 DIAGNOSIS — H16001 Unspecified corneal ulcer, right eye: Secondary | ICD-10-CM | POA: Diagnosis not present

## 2024-06-29 DIAGNOSIS — H169 Unspecified keratitis: Secondary | ICD-10-CM | POA: Diagnosis not present

## 2024-07-04 DIAGNOSIS — H16001 Unspecified corneal ulcer, right eye: Secondary | ICD-10-CM | POA: Diagnosis not present

## 2024-07-06 ENCOUNTER — Other Ambulatory Visit: Payer: Self-pay | Admitting: Primary Care

## 2024-07-06 DIAGNOSIS — K219 Gastro-esophageal reflux disease without esophagitis: Secondary | ICD-10-CM

## 2024-07-06 DIAGNOSIS — Z6379 Other stressful life events affecting family and household: Secondary | ICD-10-CM

## 2024-07-12 ENCOUNTER — Ambulatory Visit: Admitting: Primary Care

## 2024-07-12 ENCOUNTER — Encounter: Payer: Self-pay | Admitting: Primary Care

## 2024-07-12 ENCOUNTER — Inpatient Hospital Stay
Admission: EM | Admit: 2024-07-12 | Discharge: 2024-07-18 | DRG: 378 | Disposition: A | Discharge status: Home / Self Care | Attending: Student | Admitting: Student

## 2024-07-12 VITALS — BP 122/60 | HR 85 | Temp 97.3°F | Ht 69.0 in | Wt 174.0 lb

## 2024-07-12 DIAGNOSIS — I129 Hypertensive chronic kidney disease with stage 1 through stage 4 chronic kidney disease, or unspecified chronic kidney disease: Secondary | ICD-10-CM | POA: Diagnosis not present

## 2024-07-12 DIAGNOSIS — Z882 Allergy status to sulfonamides status: Secondary | ICD-10-CM | POA: Diagnosis not present

## 2024-07-12 DIAGNOSIS — Z7985 Long-term (current) use of injectable non-insulin antidiabetic drugs: Secondary | ICD-10-CM

## 2024-07-12 DIAGNOSIS — K922 Gastrointestinal hemorrhage, unspecified: Secondary | ICD-10-CM | POA: Diagnosis not present

## 2024-07-12 DIAGNOSIS — D649 Anemia, unspecified: Secondary | ICD-10-CM | POA: Diagnosis not present

## 2024-07-12 DIAGNOSIS — Z86718 Personal history of other venous thrombosis and embolism: Secondary | ICD-10-CM

## 2024-07-12 DIAGNOSIS — I808 Phlebitis and thrombophlebitis of other sites: Secondary | ICD-10-CM | POA: Diagnosis not present

## 2024-07-12 DIAGNOSIS — L03113 Cellulitis of right upper limb: Secondary | ICD-10-CM | POA: Diagnosis not present

## 2024-07-12 DIAGNOSIS — E1122 Type 2 diabetes mellitus with diabetic chronic kidney disease: Secondary | ICD-10-CM | POA: Diagnosis not present

## 2024-07-12 DIAGNOSIS — K317 Polyp of stomach and duodenum: Secondary | ICD-10-CM | POA: Diagnosis present

## 2024-07-12 DIAGNOSIS — R1013 Epigastric pain: Secondary | ICD-10-CM | POA: Diagnosis not present

## 2024-07-12 DIAGNOSIS — D490 Neoplasm of unspecified behavior of digestive system: Secondary | ICD-10-CM | POA: Diagnosis not present

## 2024-07-12 DIAGNOSIS — I82611 Acute embolism and thrombosis of superficial veins of right upper extremity: Secondary | ICD-10-CM | POA: Diagnosis not present

## 2024-07-12 DIAGNOSIS — R195 Other fecal abnormalities: Secondary | ICD-10-CM | POA: Diagnosis not present

## 2024-07-12 DIAGNOSIS — Z91013 Allergy to seafood: Secondary | ICD-10-CM | POA: Diagnosis not present

## 2024-07-12 DIAGNOSIS — K254 Chronic or unspecified gastric ulcer with hemorrhage: Principal | ICD-10-CM | POA: Diagnosis present

## 2024-07-12 DIAGNOSIS — N1832 Chronic kidney disease, stage 3b: Secondary | ICD-10-CM | POA: Diagnosis present

## 2024-07-12 DIAGNOSIS — F32A Depression, unspecified: Secondary | ICD-10-CM | POA: Diagnosis present

## 2024-07-12 DIAGNOSIS — E785 Hyperlipidemia, unspecified: Secondary | ICD-10-CM | POA: Diagnosis present

## 2024-07-12 DIAGNOSIS — H409 Unspecified glaucoma: Secondary | ICD-10-CM | POA: Diagnosis present

## 2024-07-12 DIAGNOSIS — Z884 Allergy status to anesthetic agent status: Secondary | ICD-10-CM

## 2024-07-12 DIAGNOSIS — I700 Atherosclerosis of aorta: Secondary | ICD-10-CM | POA: Diagnosis not present

## 2024-07-12 DIAGNOSIS — R58 Hemorrhage, not elsewhere classified: Secondary | ICD-10-CM | POA: Diagnosis not present

## 2024-07-12 DIAGNOSIS — K257 Chronic gastric ulcer without hemorrhage or perforation: Secondary | ICD-10-CM | POA: Diagnosis not present

## 2024-07-12 DIAGNOSIS — K920 Hematemesis: Secondary | ICD-10-CM | POA: Diagnosis not present

## 2024-07-12 DIAGNOSIS — Z7984 Long term (current) use of oral hypoglycemic drugs: Secondary | ICD-10-CM

## 2024-07-12 DIAGNOSIS — Z8572 Personal history of non-Hodgkin lymphomas: Secondary | ICD-10-CM

## 2024-07-12 DIAGNOSIS — K2950 Unspecified chronic gastritis without bleeding: Secondary | ICD-10-CM | POA: Diagnosis not present

## 2024-07-12 DIAGNOSIS — R112 Nausea with vomiting, unspecified: Secondary | ICD-10-CM

## 2024-07-12 DIAGNOSIS — E1140 Type 2 diabetes mellitus with diabetic neuropathy, unspecified: Secondary | ICD-10-CM | POA: Diagnosis present

## 2024-07-12 DIAGNOSIS — R911 Solitary pulmonary nodule: Secondary | ICD-10-CM | POA: Diagnosis present

## 2024-07-12 DIAGNOSIS — K259 Gastric ulcer, unspecified as acute or chronic, without hemorrhage or perforation: Secondary | ICD-10-CM | POA: Diagnosis not present

## 2024-07-12 DIAGNOSIS — Z955 Presence of coronary angioplasty implant and graft: Secondary | ICD-10-CM | POA: Diagnosis not present

## 2024-07-12 DIAGNOSIS — Z9841 Cataract extraction status, right eye: Secondary | ICD-10-CM

## 2024-07-12 DIAGNOSIS — Z881 Allergy status to other antibiotic agents status: Secondary | ICD-10-CM

## 2024-07-12 DIAGNOSIS — R000 Tachycardia, unspecified: Secondary | ICD-10-CM | POA: Diagnosis present

## 2024-07-12 DIAGNOSIS — Z860100 Personal history of colon polyps, unspecified: Secondary | ICD-10-CM

## 2024-07-12 DIAGNOSIS — I4891 Unspecified atrial fibrillation: Secondary | ICD-10-CM | POA: Diagnosis not present

## 2024-07-12 DIAGNOSIS — K219 Gastro-esophageal reflux disease without esophagitis: Secondary | ICD-10-CM | POA: Diagnosis present

## 2024-07-12 DIAGNOSIS — K6389 Other specified diseases of intestine: Secondary | ICD-10-CM | POA: Diagnosis not present

## 2024-07-12 DIAGNOSIS — I4819 Other persistent atrial fibrillation: Secondary | ICD-10-CM | POA: Diagnosis not present

## 2024-07-12 DIAGNOSIS — Z974 Presence of external hearing-aid: Secondary | ICD-10-CM

## 2024-07-12 DIAGNOSIS — Z9842 Cataract extraction status, left eye: Secondary | ICD-10-CM

## 2024-07-12 DIAGNOSIS — Z888 Allergy status to other drugs, medicaments and biological substances status: Secondary | ICD-10-CM

## 2024-07-12 DIAGNOSIS — M79621 Pain in right upper arm: Secondary | ICD-10-CM | POA: Diagnosis not present

## 2024-07-12 DIAGNOSIS — Z885 Allergy status to narcotic agent status: Secondary | ICD-10-CM

## 2024-07-12 DIAGNOSIS — Z8249 Family history of ischemic heart disease and other diseases of the circulatory system: Secondary | ICD-10-CM

## 2024-07-12 DIAGNOSIS — K3189 Other diseases of stomach and duodenum: Secondary | ICD-10-CM | POA: Diagnosis not present

## 2024-07-12 DIAGNOSIS — D62 Acute posthemorrhagic anemia: Secondary | ICD-10-CM | POA: Diagnosis not present

## 2024-07-12 DIAGNOSIS — D509 Iron deficiency anemia, unspecified: Secondary | ICD-10-CM | POA: Diagnosis not present

## 2024-07-12 DIAGNOSIS — K921 Melena: Secondary | ICD-10-CM | POA: Diagnosis not present

## 2024-07-12 DIAGNOSIS — F419 Anxiety disorder, unspecified: Secondary | ICD-10-CM | POA: Diagnosis present

## 2024-07-12 DIAGNOSIS — Z79899 Other long term (current) drug therapy: Secondary | ICD-10-CM

## 2024-07-12 DIAGNOSIS — I2510 Atherosclerotic heart disease of native coronary artery without angina pectoris: Secondary | ICD-10-CM | POA: Diagnosis present

## 2024-07-12 DIAGNOSIS — Z87442 Personal history of urinary calculi: Secondary | ICD-10-CM

## 2024-07-12 DIAGNOSIS — Z90711 Acquired absence of uterus with remaining cervical stump: Secondary | ICD-10-CM

## 2024-07-12 DIAGNOSIS — Z7901 Long term (current) use of anticoagulants: Secondary | ICD-10-CM

## 2024-07-12 DIAGNOSIS — K589 Irritable bowel syndrome without diarrhea: Secondary | ICD-10-CM | POA: Diagnosis present

## 2024-07-12 DIAGNOSIS — Z9221 Personal history of antineoplastic chemotherapy: Secondary | ICD-10-CM | POA: Diagnosis not present

## 2024-07-12 HISTORY — DX: Nausea with vomiting, unspecified: R11.2

## 2024-07-12 HISTORY — DX: Gastrointestinal hemorrhage, unspecified: K92.2

## 2024-07-12 LAB — CBC WITH DIFFERENTIAL/PLATELET
Basophils Absolute: 0 K/uL (ref 0.0–0.1)
Basophils Relative: 0.4 % (ref 0.0–3.0)
Eosinophils Absolute: 0 K/uL (ref 0.0–0.7)
Eosinophils Relative: 0.2 % (ref 0.0–5.0)
HCT: 28.8 % — ABNORMAL LOW (ref 36.0–46.0)
Hemoglobin: 9.5 g/dL — ABNORMAL LOW (ref 12.0–15.0)
Lymphocytes Relative: 24.3 % (ref 12.0–46.0)
Lymphs Abs: 1 K/uL (ref 0.7–4.0)
MCHC: 32.8 g/dL (ref 30.0–36.0)
MCV: 96.8 fl (ref 78.0–100.0)
Monocytes Absolute: 0.3 K/uL (ref 0.1–1.0)
Monocytes Relative: 6.2 % (ref 3.0–12.0)
Neutro Abs: 2.8 K/uL (ref 1.4–7.7)
Neutrophils Relative %: 68.9 % (ref 43.0–77.0)
Platelets: 252 K/uL (ref 150.0–400.0)
RBC: 2.97 Mil/uL — ABNORMAL LOW (ref 3.87–5.11)
RDW: 19.2 % — ABNORMAL HIGH (ref 11.5–15.5)
WBC: 4.1 K/uL (ref 4.0–10.5)

## 2024-07-12 LAB — BASIC METABOLIC PANEL WITH GFR
BUN: 45 mg/dL — ABNORMAL HIGH (ref 6–23)
CO2: 31 meq/L (ref 19–32)
Calcium: 8.9 mg/dL (ref 8.4–10.5)
Chloride: 100 meq/L (ref 96–112)
Creatinine, Ser: 1.24 mg/dL — ABNORMAL HIGH (ref 0.40–1.20)
GFR: 38.33 mL/min — ABNORMAL LOW (ref >60.00)
Glucose, Bld: 312 mg/dL — ABNORMAL HIGH (ref 70–99)
Potassium: 5.6 meq/L — ABNORMAL HIGH (ref 3.5–5.1)
Sodium: 138 meq/L (ref 135–145)

## 2024-07-12 LAB — COMPREHENSIVE METABOLIC PANEL WITH GFR
ALT: 10 U/L (ref 0–44)
AST: 21 U/L (ref 15–41)
Albumin: 3.1 g/dL — ABNORMAL LOW (ref 3.5–5.0)
Alkaline Phosphatase: 19 U/L — ABNORMAL LOW (ref 38–126)
Anion gap: 10 (ref 5–15)
BUN: 60 mg/dL — ABNORMAL HIGH (ref 8–23)
CO2: 26 mmol/L (ref 22–32)
Calcium: 8.5 mg/dL — ABNORMAL LOW (ref 8.9–10.3)
Chloride: 101 mmol/L (ref 98–111)
Creatinine, Ser: 1.34 mg/dL — ABNORMAL HIGH (ref 0.44–1.00)
GFR, Estimated: 38 mL/min — ABNORMAL LOW (ref >60)
Glucose, Bld: 274 mg/dL — ABNORMAL HIGH (ref 70–99)
Potassium: 4.7 mmol/L (ref 3.5–5.1)
Sodium: 137 mmol/L (ref 135–145)
Total Bilirubin: 1.2 mg/dL (ref 0.0–1.2)
Total Protein: 5.4 g/dL — ABNORMAL LOW (ref 6.5–8.1)

## 2024-07-12 LAB — LIPASE: Lipase: 25 U/L (ref 11.0–59.0)

## 2024-07-12 LAB — CBC
HCT: 23.9 % — ABNORMAL LOW (ref 36.0–46.0)
Hemoglobin: 7.8 g/dL — ABNORMAL LOW (ref 12.0–15.0)
MCH: 32.6 pg (ref 26.0–34.0)
MCHC: 32.6 g/dL (ref 30.0–36.0)
MCV: 100 fL (ref 80.0–100.0)
Platelets: 202 K/uL (ref 150–400)
RBC: 2.39 MIL/uL — ABNORMAL LOW (ref 3.87–5.11)
RDW: 17.2 % — ABNORMAL HIGH (ref 11.5–15.5)
WBC: 5.1 K/uL (ref 4.0–10.5)
nRBC: 0 % (ref 0.0–0.2)

## 2024-07-12 LAB — HEMOCCULT GUIAC POC 1CARD (OFFICE): Fecal Occult Blood, POC: POSITIVE — AB

## 2024-07-12 LAB — HEMOGLOBIN: Hemoglobin: 7.7 g/dL — ABNORMAL LOW (ref 12.0–15.0)

## 2024-07-12 LAB — PROTIME-INR
INR: 1.9 — ABNORMAL HIGH (ref 0.8–1.2)
Prothrombin Time: 22.4 s — ABNORMAL HIGH (ref 11.4–15.2)

## 2024-07-12 LAB — CBG MONITORING, ED
Glucose-Capillary: 179 mg/dL — ABNORMAL HIGH (ref 70–99)
Glucose-Capillary: 150 mg/dL — ABNORMAL HIGH (ref 70–99)

## 2024-07-12 LAB — PREPARE RBC (CROSSMATCH)

## 2024-07-12 MED ORDER — SODIUM CHLORIDE 0.9% IV SOLUTION
Freq: Once | INTRAVENOUS | Status: DC
  Filled 2024-07-12: qty 250

## 2024-07-12 MED ORDER — FAMOTIDINE IN NACL 20-0.9 MG/50ML-% IV SOLN
20.0000 mg | Freq: Once | INTRAVENOUS | Status: AC
  Administered 2024-07-12: 20 mg via INTRAVENOUS
  Filled 2024-07-12: qty 50

## 2024-07-12 MED ORDER — MELATONIN 5 MG PO TABS: 5.0000 mg | ORAL_TABLET | Freq: Once | ORAL | Status: DC

## 2024-07-12 MED ORDER — PROMETHAZINE (PHENERGAN) 6.25MG IN NS 50ML IVPB
6.2500 mg | Freq: Four times a day (QID) | INTRAVENOUS | Status: DC | PRN
  Administered 2024-07-12 – 2024-07-13 (×3): 6.25 mg via INTRAVENOUS
  Filled 2024-07-12 (×3): qty 6.25

## 2024-07-12 MED ORDER — BEMPEDOIC ACID-EZETIMIBE 180-10 MG PO TABS: 1.0000 | ORAL_TABLET | Freq: Every day | ORAL | Status: DC

## 2024-07-12 MED ORDER — SODIUM CHLORIDE 0.9 % IV SOLN: INTRAVENOUS | Status: DC

## 2024-07-12 MED ORDER — INSULIN ASPART 100 UNIT/ML IJ SOLN
0.0000 [IU] | INTRAMUSCULAR | Status: DC
  Administered 2024-07-12 – 2024-07-16 (×6): 1 [IU] via SUBCUTANEOUS
  Administered 2024-07-16: 3 [IU] via SUBCUTANEOUS
  Administered 2024-07-17 – 2024-07-18 (×3): 1 [IU] via SUBCUTANEOUS
  Administered 2024-07-18: 2 [IU] via SUBCUTANEOUS
  Administered 2024-07-18 (×3): 1 [IU] via SUBCUTANEOUS
  Filled 2024-07-12 (×13): qty 1

## 2024-07-12 MED ORDER — PANTOPRAZOLE SODIUM 40 MG IV SOLR
40.0000 mg | Freq: Two times a day (BID) | INTRAVENOUS | Status: DC
  Administered 2024-07-12 – 2024-07-18 (×12): 40 mg via INTRAVENOUS
  Filled 2024-07-12 (×12): qty 10

## 2024-07-12 MED ORDER — SERTRALINE HCL 50 MG PO TABS
100.0000 mg | ORAL_TABLET | Freq: Every day | ORAL | Status: DC
  Administered 2024-07-13 – 2024-07-18 (×6): 100 mg via ORAL
  Filled 2024-07-12 (×6): qty 2

## 2024-07-12 MED ORDER — ALBUTEROL SULFATE (2.5 MG/3ML) 0.083% IN NEBU
2.5000 mg | INHALATION_SOLUTION | RESPIRATORY_TRACT | Status: DC | PRN
Start: 2024-07-12 — End: 2024-07-19

## 2024-07-12 MED ORDER — ONDANSETRON 4 MG PO TBDP: 4.0000 mg | ORAL_TABLET | Freq: Three times a day (TID) | ORAL | 0 refills | Status: DC | PRN

## 2024-07-12 NOTE — Assessment & Plan Note (Addendum)
 Differentials include gastric/duodenal ulcer, pancreatitis, GERD.  Checking labs today including lipase, CBC, BMP. Stat referral placed to GI. Increase pantoprazole  to 40 mg daily.  May need further dose adjustment pending results.  Prescription for Zofran  provided to use as needed.  We stressed the importance of proper hydration with water and electrolytes.  As of now, she appears stable for outpatient treatment; however, the provided strict ED precautions.

## 2024-07-12 NOTE — ED Triage Notes (Signed)
 Pt BIB [facility] for c/c of vomiting coffee ground emesis and dark stool since 0300 today. Pt does take Eliquis  for a-fib. EMS admin 4mg  Zofran . Pt alert and oriented at triage c/o upper GI pain.

## 2024-07-12 NOTE — ED Notes (Signed)
 CCMD notified of cardiac monitoring order.

## 2024-07-12 NOTE — Progress Notes (Signed)
 Subjective:    Patient ID: [name] [name], female    DOB: March 23, 1932, 89 y.o.   MRN: [id number]  Emesis  Associated symptoms include abdominal pain. Pertinent negatives include no chest pain, diarrhea or fever.    [name] [name] is a very pleasant 89 y.o. female with a history of type 2 diabetes, CKD, hypertension, CAD, persistent atrial fibrillation, diverticulosis, GI bleed, GERD, IBS, chronic diarrhea who presents today to discuss nausea and vomiting.  Symptom onset at 1 AM this morning with epigastric pain and belching. Around 3 AM she vomited coffee ground emesis. Her second episode of vomiting was at 8 AM, about 15 minutes after she took her meds, looked like coffee grounds. All she can taste is onions now.   Her epigastric pain is intermittent, dull, achy. B She does notice dark color to the stools. She's been taking daily oral iron . She also took Pepto Bismol a few hours ago.   Last night she ate potato soup with onions. She did not lay flat within 2 hours after eating. She believes the onions caused these symptoms.   She denies diarrhea, fevers. She's been drinking some Coke and Ginger Ale.  She was managed on apixaban  5 mg twice daily for atrial fibrillation, pantoprazole  20 mg daily.  She avoids NSAIDs.  BP Readings from Last 3 Encounters:  07/12/24 122/60  05/24/24 130/72  05/23/24 126/68      Review of Systems  Constitutional:  Positive for fatigue. Negative for fever.  Respiratory:  Negative for shortness of breath.   Cardiovascular:  Negative for chest pain.  Gastrointestinal:  Positive for abdominal pain, nausea and vomiting. Negative for blood in stool, constipation and diarrhea.         Past Medical History:  Diagnosis Date   Acute bilateral thoracic back pain 08/16/2019   Acute post-hemorrhagic anemia 10/10/2023   Allergy     Cipro, Plavix, Statins   Anemia    Arthritis    Atrial fibrillation (HCC)    CAD (coronary artery disease)    a. s/p tandem Promus  DES to LAD in 2009 by Dr. Obie;  b.  LHC (03/22/14):  prox LAD 30%, mid LAD 40%, LAD stent ok with dist 20% ISR, apical LAD occluded with L-L collats filling apical vessel (too small for PCI), mid RCA 20%, EF 70%.  Med Rx   Cancer [facility])    Chronic kidney disease, stage 3b (HCC)    Colon polyps    Diabetes mellitus    Diagnosed 2012   Diverticulosis of colon (without mention of hemorrhage)    DVT (deep venous thrombosis) (HCC) 12/2022   right leg   GERD (gastroesophageal reflux disease)    Glaucoma    Grade I diastolic dysfunction    Grade I diastolic dysfunction    Herpes zoster without complication 04/24/2020   Hyperlipidemia    intol of statins   Hypertension    Kidney stones    Left atrial dilation    Moderate aortic regurgitation    Motion sickness    back seat cars   NHL (non-Hodgkin's lymphoma) (HCC) dx'd 2003   chemo/xrt comp 2003   Personal history of colonic polyps    Proctitis    Pruritic intertrigo 07/18/2014   Urticaria    Wears dentures    Full upper, partial lower   Wears hearing aid in both ears     Social History   Socioeconomic History   Marital status: Widowed    Spouse name: Not  on file   Number of children: 0   Years of education: Not on file   Highest education level: 12th grade  Occupational History   Occupation: Retired    Associate Professor: RETIRED   Occupation: retired  Tobacco Use   Smoking status: Never   Smokeless tobacco: Never  Vaping Use   Vaping status: Never Used  Substance and Sexual Activity   Alcohol use: No   Drug use: No   Sexual activity: Not Currently  Other Topics Concern   Not on file  Social History Narrative   Not on file   Social Drivers of Health   Financial Resource Strain: Low Risk  (05/20/2024)   Overall Financial Resource Strain (CARDIA)    Difficulty of Paying Living Expenses: Not hard at all  Food Insecurity: No Food Insecurity (05/20/2024)   Hunger Vital Sign    Worried About Running Out of Food in the Last  Year: Never true    Ran Out of Food in the Last Year: Never true  Transportation Needs: No Transportation Needs (05/20/2024)   PRAPARE - Administrator, Civil Service (Medical): No    Lack of Transportation (Non-Medical): No  Physical Activity: Inactive (05/20/2024)   Exercise Vital Sign    Days of Exercise per Week: 0 days    Minutes of Exercise per Session: Not on file  Stress: No Stress Concern Present (05/20/2024)   [employer] of Occupational Health - Occupational Stress Questionnaire    Feeling of Stress: Not at all  Social Connections: Moderately Integrated (05/20/2024)   Social Connection and Isolation Panel    Frequency of Communication with Friends and Family: More than three times a week    Frequency of Social Gatherings with Friends and Family: More than three times a week    Attends Religious Services: More than 4 times per year    Active Member of [employer] or Organizations: Yes    Attends Banker Meetings: More than 4 times per year    Marital Status: Widowed  Intimate Partner Violence: Not At Risk (10/26/2023)   Humiliation, Afraid, Rape, and Kick questionnaire    Fear of Current or Ex-Partner: No    Emotionally Abused: No    Physically Abused: No    Sexually Abused: No    Past Surgical History:  Procedure Laterality Date   BIOPSY  10/15/2023   Procedure: BIOPSY;  Surgeon: Aundria, Ladell POUR, MD;  Location: [facility] ENDOSCOPY;  Service: Gastroenterology;;   cardiac cath-neg     CATARACT EXTRACTION Bilateral    COLONOSCOPY WITH PROPOFOL  N/A 10/15/2023   Procedure: COLONOSCOPY WITH PROPOFOL ;  Surgeon: Toledo, Ladell POUR, MD;  Location: [facility] ENDOSCOPY;  Service: Gastroenterology;  Laterality: N/A;   CORONARY ANGIOPLASTY WITH STENT PLACEMENT     x 2   dexa-neg     ESOPHAGOGASTRODUODENOSCOPY (EGD) WITH PROPOFOL  N/A 10/15/2023   Procedure: ESOPHAGOGASTRODUODENOSCOPY (EGD) WITH PROPOFOL ;  Surgeon: Toledo, Ladell POUR, MD;  Location: [facility] ENDOSCOPY;   Service: Gastroenterology;  Laterality: N/A;   HEMOSTASIS CLIP PLACEMENT  10/15/2023   Procedure: HEMOSTASIS CLIP PLACEMENT;  Surgeon: Aundria, Ladell POUR, MD;  Location: [facility] ENDOSCOPY;  Service: Gastroenterology;;   KNEE SURGERY  10/30/2003   left   LEFT HEART CATHETERIZATION WITH CORONARY ANGIOGRAM N/A 03/22/2014   Procedure: LEFT HEART CATHETERIZATION WITH CORONARY ANGIOGRAM;  Surgeon: Lonni JONETTA Cash, MD;  Location: [facility] CATH LAB;  Service: Cardiovascular;  Laterality: N/A;   POLYPECTOMY  10/15/2023   Procedure: POLYPECTOMY;  Surgeon: Montpelier, Teodoro K,  MD;  Location: [facility] ENDOSCOPY;  Service: Gastroenterology;;   stress cardiolite      VAGINAL HYSTERECTOMY     partial, fibroids, one ovary left    Family History  Problem Relation Age of Onset   Cancer Mother        jaw   Hypertension Father    Heart attack Father    Heart attack Sister    Cancer Sister        unknown primary-mets throughout body   Colon cancer Neg Hx    Esophageal cancer Neg Hx    Pancreatic cancer Neg Hx    Stomach cancer Neg Hx    Liver disease Neg Hx     Allergies  Allergen Reactions   Other Rash and Anaphylaxis   Shellfish Allergy  Anaphylaxis and Rash   Cephalexin      REACTION: trash and swelling   Ciprofloxacin     REACTION: u/k   Clopidogrel Bisulfate     REACTION: swelling, rash   Ezetimibe -Simvastatin     REACTION: myalgias   Lidocaine  Hives    ONLY TO ADHESIVE LIDOCAINE  PATCHES. NO PROBLEM WITH INJECTABLE LIDOCAINE .   Nitrofurantoin     REACTION: panama   Pregabalin     REACTION: felt bad   Celecoxib Rash    REACTION: uk   Codeine Rash   Codeine Phosphate Rash   Hydrocod Poli-Chlorphe Poli Er Rash   Propoxyphene Rash   Propoxyphene N-Acetaminophen  Rash   Rofecoxib Rash   Sulfa Antibiotics Rash   Sulfamethoxazole Rash   Sulfonamide Derivatives Rash    Current Outpatient Medications on File Prior to Visit  Medication Sig Dispense Refill   apixaban  (ELIQUIS ) 5 MG TABS tablet  Take 1 tablet (5 mg total) by mouth 2 (two) times daily. 180 tablet 1   Bempedoic Acid -Ezetimibe  (NEXLIZET ) 180-10 MG TABS Take 1 tablet by mouth daily. Pt has scheduled an office visit with provider in April, 2025 90 tablet 2   Blood Glucose Monitoring Suppl DEVI 1 each by Does not apply route in the morning, at noon, and at bedtime. May substitute to any manufacturer covered by patient's insurance. 1 each 0   brimonidine  (ALPHAGAN ) 0.2 % ophthalmic solution Place 1 drop into the right eye in the morning and at bedtime.     Cholecalciferol (VITAMIN D -3) 1000 UNITS CAPS Take 1 capsule by mouth daily.     CRANBERRY PO Take 1 tablet by mouth daily.     diclofenac  Sodium (VOLTAREN ) 1 % GEL APPLY 2 GRAMS TOPICALLY THREE TIMES A DAY AS NEEDED 100 g 2   Dulaglutide  (TRULICITY ) 0.75 MG/0.5ML SOAJ Inject 0.75 mg into the skin once a week. for diabetes. 6 mL 1   ferrous sulfate  325 (65 FE) MG tablet Take 1 tablet (325 mg total) by mouth daily with breakfast. 30 tablet 2   fexofenadine  (ALLEGRA ) 180 MG tablet Take 1 tablet (180 mg total) by mouth as needed for allergies or rhinitis. 90 tablet 0   furosemide  (LASIX ) 20 MG tablet Take 1 tablet by mouth daily. May take additional tablet as needed for swelling 180 tablet 3   gabapentin  (NEURONTIN ) 300 MG capsule Take 1 capsule (300 mg total) by mouth 2 (two) times daily. 90 capsule 2   glipiZIDE  (GLUCOTROL  XL) 10 MG 24 hr tablet TAKE 1 TABLET BY MOUTH EVERY MORNING WITH BREAKFAST FOR DIABETES 90 tablet 1   Glucose Blood (BLOOD GLUCOSE TEST STRIPS) STRP 1 each by In Vitro route in the morning, at noon, and at bedtime.  May substitute to any manufacturer covered by patient's insurance. 300 strip 5   isosorbide  mononitrate (IMDUR ) 60 MG 24 hr tablet Take 1.5 tablets (90 mg total) by mouth daily. 135 tablet 3   Lancets Misc. MISC 1 each by Does not apply route in the morning, at noon, and at bedtime. May substitute to any manufacturer covered by patient's insurance. 300  each 5   metoprolol  succinate (TOPROL -XL) 100 MG 24 hr tablet TAKE 1 TABLET BY MOUTH EVERY MORNING WITH OR IMMEDIATELY FOLLOWING A MEAL 90 tablet 3   nitroGLYCERIN  (NITROSTAT ) 0.4 MG SL tablet Place 1 tablet (0.4 mg total) under the tongue every 5 (five) minutes as needed for chest pain. 25 tablet 2   pantoprazole  (PROTONIX ) 20 MG tablet TAKE 1 TABLET BY MOUTH DAILY FOR HEARTBURN 90 tablet 0   ROCKLATAN 0.02-0.005 % SOLN Place 1 drop into the right eye at bedtime.     sertraline  (ZOLOFT ) 100 MG tablet TAKE 1 TABLET BY MOUTH DAILY FOR ANXIETY AND DEPRESSION 90 tablet 0   triamcinolone  cream (KENALOG ) 0.1 % Apply 1 Application topically 2 (two) times daily. 30 g 1   vitamin B-12 (CYANOCOBALAMIN) 1000 MCG tablet Take 1,000 mcg by mouth daily.     [DISCONTINUED] loratadine  (CLARITIN ) 10 MG tablet Take 10 mg by mouth as needed.      Current Facility-Administered Medications on File Prior to Visit  Medication Dose Route Frequency Provider Last Rate Last Admin   betamethasone  acetate-betamethasone  sodium phosphate (CELESTONE ) injection 3 mg  3 mg Intra-articular Once         BP 122/60   Pulse 85   Temp (!) 97.3 F (36.3 C) (Temporal)   Ht 5' 9 (1.753 m)   Wt 174 lb (78.9 kg)   SpO2 97%   BMI 25.70 kg/m  Objective:   Physical Exam Constitutional:      Appearance: She is ill-appearing.  Cardiovascular:     Rate and Rhythm: Normal rate and regular rhythm.  Pulmonary:     Effort: Pulmonary effort is normal.     Breath sounds: Normal breath sounds.  Abdominal:     General: Bowel sounds are normal.     Palpations: Abdomen is soft.     Tenderness: There is no abdominal tenderness.  Musculoskeletal:     Cervical back: Neck supple.  Skin:    General: Skin is warm and dry.  Neurological:     Mental Status: She is alert and oriented to person, place, and time.  Psychiatric:        Mood and Affect: Mood normal.           Assessment & Plan:  Nausea and vomiting, unspecified vomiting  type Assessment & Plan: Differentials include gastric/duodenal ulcer, pancreatitis, GERD.  Checking labs today including lipase, CBC, BMP. Stat referral placed to GI. Increase pantoprazole  to 40 mg daily.  May need further dose adjustment pending results.  Prescription for Zofran  provided to use as needed.  We stressed the importance of proper hydration with water and electrolytes.  As of now, she appears stable for outpatient treatment; however, the provided strict ED precautions.  Orders: -     Ondansetron ; Take 1 tablet (4 mg total) by mouth every 8 (eight) hours as needed for nausea or vomiting.  Dispense: 20 tablet; Refill: 0 -     CBC with Differential/Platelet -     Basic metabolic panel with GFR -     Lipase -     Ambulatory referral to  Gastroenterology  Epigastric abdominal pain -     CBC with Differential/Platelet -     Basic metabolic panel with GFR -     Lipase -     Ambulatory referral to Gastroenterology  Dark stools Assessment & Plan: Positive stool card the office today.  Given coffee-ground emesis, coupled with positive stool card and symptoms, will refer to GI urgently for evaluation.    Orders: -     POCT occult blood stool -     Ambulatory referral to Gastroenterology  Coffee ground emesis -     Ambulatory referral to Gastroenterology        Comer MARLA Gaskins, NP

## 2024-07-12 NOTE — ED Notes (Signed)
 Pt voiced improvement in nausea post Phenergan  infusion.

## 2024-07-12 NOTE — Patient Instructions (Addendum)
 Increase your pantoprazole  to 40 mg once daily (2 pills) for stomach protection.   Stop by the lab prior to leaving today. I will notify you of your results once received.   You may take the Zofran  every 8 hours as needed for nausea.   Go to the hospital if your symptoms become worse.  You will receive a phone call regarding the referral to GI.  It was a pleasure to see you today!

## 2024-07-12 NOTE — ED Provider Notes (Signed)
 [facility] Provider Note   Event Date/Time   First MD Initiated Contact with Patient 07/12/24 1628     (approximate) History  GI Problem  HPI [name] [name] is a 89 y.o. female with a past medical history of hyperlipidemia, CAD, type 2 diabetes, and atrial fibrillation on anticoagulation who presents complaining of dark tarry stools as well as coffee-ground emesis that began overnight.  Patient was seen at her clinician's office today and was told to follow-up with GI however patient states that the Zofran  that was given to her in the office did not improve her nausea/vomiting.  Patient states that she has had 2 further episodes of coffee-ground emesis at home.  Patient's stool tested positive for occult blood in the office today.  Patient endorses epigastric pain associated with this nausea/vomiting.  Patient denies any diarrhea ROS: Patient currently denies any vision changes, tinnitus, difficulty speaking, facial droop, sore throat, chest pain, shortness of breath, diarrhea, dysuria, or weakness/numbness/paresthesias in any extremity   Physical Exam  Triage Vital Signs: ED Triage Vitals  Encounter Vitals Group     BP 07/12/24 1623 124/67     Girls Systolic BP Percentile --      Girls Diastolic BP Percentile --      Boys Systolic BP Percentile --      Boys Diastolic BP Percentile --      Pulse Rate 07/12/24 1623 (!) 110     Resp 07/12/24 1623 18     Temp 07/12/24 1623 99 F (37.2 C)     Temp Source 07/12/24 1623 Oral     SpO2 07/12/24 1622 100 %     Weight 07/12/24 1626 167 lb (75.8 kg)     Height 07/12/24 1626 5' 9 (1.753 m)     Head Circumference --      Peak Flow --      Pain Score 07/12/24 1626 0     Pain Loc --      Pain Education --      Exclude from Growth Chart --    Most recent vital signs: Vitals:   07/12/24 1622 07/12/24 1623  BP:  124/67  Pulse:  (!) 110  Resp:  18  Temp:  99 F (37.2 C)  SpO2: 100% 100%   General: Awake, oriented  x4. CV:  Good peripheral perfusion. Resp:  Normal effort. Abd:  No distention.  Epigastric tenderness to palpation Other:  Elderly well-developed, well-nourished Caucasian female resting comfortably in no acute distress ED Results / Procedures / Treatments  Labs (all labs ordered are listed, but only abnormal results are displayed) Labs Reviewed  COMPREHENSIVE METABOLIC PANEL WITH GFR - Abnormal; Notable for the following components:      Result Value   Glucose, Bld 274 (*)    BUN 60 (*)    Creatinine, Ser 1.34 (*)    Calcium 8.5 (*)    Total Protein 5.4 (*)    Albumin 3.1 (*)    Alkaline Phosphatase 19 (*)    GFR, Estimated 38 (*)    All other components within normal limits  CBC - Abnormal; Notable for the following components:   RBC 2.39 (*)    Hemoglobin 7.8 (*)    HCT 23.9 (*)    RDW 17.2 (*)    All other components within normal limits  PROTIME-INR - Abnormal; Notable for the following components:   Prothrombin Time 22.4 (*)    INR 1.9 (*)    All other  components within normal limits  POC OCCULT BLOOD, ED  TYPE AND SCREEN   EKG ED ECG REPORT I, Artist MARLA Kerns, the attending physician, personally viewed and interpreted this ECG. Date: 07/12/2024 EKG Time: 1631 Rate: 129 Rhythm: Atrial fibrillation with rapid ventricular response QRS Axis: normal Intervals: normal ST/T Wave abnormalities: normal Narrative Interpretation: Atrial fibrillation with rapid ventricular response.  No evidence of acute ischemia PROCEDURES: Critical Care performed: Yes, see critical care procedure note(s) .1-3 Lead EKG Interpretation  Performed by: Kerns Artist MARLA, MD Authorized by: Kerns Artist MARLA, MD     Interpretation: abnormal     ECG rate:  122   ECG rate assessment: tachycardic     Rhythm: atrial fibrillation     Ectopy: none     Conduction: normal   CRITICAL CARE Performed by: Anajulia Leyendecker K Federick Levene  Total critical care time: 35 minutes  Critical care time was exclusive of  separately billable procedures and treating other patients.  Critical care was necessary to treat or prevent imminent or life-threatening deterioration.  Critical care was time spent personally by me on the following activities: development of treatment plan with patient and/or surrogate as well as nursing, discussions with consultants, evaluation of patient's response to treatment, examination of patient, obtaining history from patient or surrogate, ordering and performing treatments and interventions, ordering and review of laboratory studies, ordering and review of radiographic studies, pulse oximetry and re-evaluation of patient's condition.  MEDICATIONS ORDERED IN ED: Medications  famotidine  (PEPCID ) IVPB 20 mg premix (20 mg Intravenous New Bag/Given 07/12/24 1657)  promethazine  (PHENERGAN ) 6.25 mg/NS 50 mL IVPB (has no administration in time range)   IMPRESSION / MDM / ASSESSMENT AND PLAN / ED COURSE  I reviewed the triage vital signs and the nursing notes.                             The patient is on the cardiac monitor to evaluate for evidence of arrhythmia and/or significant heart rate changes. Patient's presentation is most consistent with acute presentation with potential threat to life or bodily function. + abdominal pain + mixed coffee ground and bright red hematemesis + black stool per rectum Given history and exam patient's presentation most consistent with upper GI bleed possibly secondary to peptic ulcer disease or variceal bleeding. I have low suspicion for aortoenteric fistula, ENT bleeding mimic, Boerhaave's, Pulmonary bleeding mimic.  Workup: CBC, BMP, LFTs, Lipase, PT/INR, Type and Screen  Interventions: Analgesia and antiemetic medications PRN Protonix  40mg  IVP PRBC transfusion  Findings: Hb: 7.8  Disposition: Admit for close monitoring.   FINAL CLINICAL IMPRESSION(S) / ED DIAGNOSES   Final diagnoses:  Acute upper GI bleeding  Epigastric pain  Nausea and  vomiting, unspecified vomiting type  Coffee ground emesis  Heme positive stool   Rx / DC Orders   ED Discharge Orders     None      Note:  This document was prepared using Dragon voice recognition software and may include unintentional dictation errors.   Katha Kuehne K, MD 07/12/24 1700

## 2024-07-12 NOTE — H&P (Addendum)
 History and Physical    [name] [name] FMW:[id number] DOB: [birth year] DOA: 07/12/2024  PCP: Gretta Comer POUR, NP  Patient coming from: home  I have personally briefly reviewed patient's old medical records in [facility]  Chief Complaint: coffee ground emesis, black stools   HPI: [name] [name] is a 89 y.o. female with medical history significant of  Persistent Atrial fibrillation on eliquis , hx of DVT 1 year ago, HTN ,CAD, mitral valve prolapse,GERD, IBS-C,DMII, MALT lymphoma,CKDIIIa, IDA who presents to ED with complaints of coffee grounds emesis as well as dark stools that started last pm. Patient followed up with pcp this am and was given antiemetic and told to go to ER if symptoms did not improve.  Due to persistent symptoms patient presented to ED. Patient notes she has not had any further emesis or black stools. She does however noted  continued nausea and epigastric tenderness.   ED Course:  Temp 99, bp 122/60, hr 110, sat 97% on ra  Fob + in pcp office  Hgb 9.5 in pcp office 7.8 in ed Wbc 5.1, plt 202 Inr 1.9  Na 137, 4.7, cl 101, glu 274, cr 1.34 ( at baseline )  EKG: atrial fibrillation  at 129 Tx  pepcid   20 iv , phenegran  Review of Systems: As per HPI otherwise 10 point review of systems negative.   Past Medical History:  Diagnosis Date   Acute bilateral thoracic back pain 08/16/2019   Acute post-hemorrhagic anemia 10/10/2023   Allergy     Cipro, Plavix, Statins   Anemia    Arthritis    Atrial fibrillation (HCC)    CAD (coronary artery disease)    a. s/p tandem Promus DES to LAD in 2009 by Dr. Obie;  b.  LHC (03/22/14):  prox LAD 30%, mid LAD 40%, LAD stent ok with dist 20% ISR, apical LAD occluded with L-L collats filling apical vessel (too small for PCI), mid RCA 20%, EF 70%.  Med Rx   Cancer [facility])    Chronic kidney disease, stage 3b (HCC)    Colon polyps    Diabetes mellitus    Diagnosed 2012   Diverticulosis of colon (without mention of hemorrhage)     DVT (deep venous thrombosis) (HCC) 12/2022   right leg   GERD (gastroesophageal reflux disease)    Glaucoma    Grade I diastolic dysfunction    Grade I diastolic dysfunction    Herpes zoster without complication 04/24/2020   Hyperlipidemia    intol of statins   Hypertension    Kidney stones    Left atrial dilation    Moderate aortic regurgitation    Motion sickness    back seat cars   NHL (non-Hodgkin's lymphoma) (HCC) dx'd 2003   chemo/xrt comp 2003   Personal history of colonic polyps    Proctitis    Pruritic intertrigo 07/18/2014   Urticaria    Wears dentures    Full upper, partial lower   Wears hearing aid in both ears     Past Surgical History:  Procedure Laterality Date   BIOPSY  10/15/2023   Procedure: BIOPSY;  Surgeon: Aundria, Ladell POUR, MD;  Location: [facility] ENDOSCOPY;  Service: Gastroenterology;;   cardiac cath-neg     CATARACT EXTRACTION Bilateral    COLONOSCOPY WITH PROPOFOL  N/A 10/15/2023   Procedure: COLONOSCOPY WITH PROPOFOL ;  Surgeon: Toledo, Ladell POUR, MD;  Location: [facility] ENDOSCOPY;  Service: Gastroenterology;  Laterality: N/A;   CORONARY ANGIOPLASTY WITH STENT PLACEMENT  x 2   dexa-neg     ESOPHAGOGASTRODUODENOSCOPY (EGD) WITH PROPOFOL  N/A 10/15/2023   Procedure: ESOPHAGOGASTRODUODENOSCOPY (EGD) WITH PROPOFOL ;  Surgeon: Toledo, Ladell POUR, MD;  Location: [facility] ENDOSCOPY;  Service: Gastroenterology;  Laterality: N/A;   HEMOSTASIS CLIP PLACEMENT  10/15/2023   Procedure: HEMOSTASIS CLIP PLACEMENT;  Surgeon: Aundria, Ladell POUR, MD;  Location: [facility] ENDOSCOPY;  Service: Gastroenterology;;   KNEE SURGERY  10/30/2003   left   LEFT HEART CATHETERIZATION WITH CORONARY ANGIOGRAM N/A 03/22/2014   Procedure: LEFT HEART CATHETERIZATION WITH CORONARY ANGIOGRAM;  Surgeon: Lonni JONETTA Cash, MD;  Location: [facility] CATH LAB;  Service: Cardiovascular;  Laterality: N/A;   POLYPECTOMY  10/15/2023   Procedure: POLYPECTOMY;  Surgeon: Toledo, Teodoro K, MD;  Location: [facility]  ENDOSCOPY;  Service: Gastroenterology;;   stress cardiolite      VAGINAL HYSTERECTOMY     partial, fibroids, one ovary left     reports that she has never smoked. She has never used smokeless tobacco. She reports that she does not drink alcohol and does not use drugs.  Allergies  Allergen Reactions   Other Rash and Anaphylaxis   Shellfish Allergy  Anaphylaxis and Rash   Cephalexin      REACTION: trash and swelling   Ciprofloxacin     REACTION: u/k   Clopidogrel Bisulfate     REACTION: swelling, rash   Ezetimibe -Simvastatin     REACTION: myalgias   Lidocaine  Hives    ONLY TO ADHESIVE LIDOCAINE  PATCHES. NO PROBLEM WITH INJECTABLE LIDOCAINE .   Nitrofurantoin     REACTION: panama   Pregabalin     REACTION: felt bad   Celecoxib Rash    REACTION: uk   Codeine Rash   Codeine Phosphate Rash   Hydrocod Poli-Chlorphe Poli Er Rash   Propoxyphene Rash   Propoxyphene N-Acetaminophen  Rash   Rofecoxib Rash   Sulfa Antibiotics Rash   Sulfamethoxazole Rash   Sulfonamide Derivatives Rash    Family History  Problem Relation Age of Onset   Cancer Mother        jaw   Hypertension Father    Heart attack Father    Heart attack Sister    Cancer Sister        unknown primary-mets throughout body   Colon cancer Neg Hx    Esophageal cancer Neg Hx    Pancreatic cancer Neg Hx    Stomach cancer Neg Hx    Liver disease Neg Hx     Prior to Admission medications   Medication Sig Start Date End Date Taking? Authorizing Provider  apixaban  (ELIQUIS ) 5 MG TABS tablet Take 1 tablet (5 mg total) by mouth 2 (two) times daily. 03/05/24  Yes Gollan, Timothy J, MD  brimonidine  (ALPHAGAN ) 0.2 % ophthalmic solution Place 1 drop into the right eye in the morning and at bedtime.   Yes [provider]  Cholecalciferol  (VITAMIN D -3) 1000 UNITS CAPS Take 1 capsule by mouth daily.   Yes [provider]  CRANBERRY PO Take 1 tablet by mouth daily.   Yes [provider]  diclofenac  Sodium  (VOLTAREN ) 1 % GEL APPLY 2 GRAMS TOPICALLY THREE TIMES A DAY AS NEEDED 02/05/23  Yes [name], Katherine K, NP  Dulaglutide  (TRULICITY ) 0.75 MG/0.5ML SOAJ Inject 0.75 mg into the skin once a week. for diabetes. 03/08/24  Yes Gretta Comer POUR, NP  ferrous sulfate  325 (65 FE) MG tablet Take 1 tablet (325 mg total) by mouth daily with breakfast. 10/28/22  Yes Kerman Vina HERO, NP  fexofenadine  (ALLEGRA ) 180 MG  tablet Take 1 tablet (180 mg total) by mouth as needed for allergies or rhinitis. 04/03/19  Yes [name], Katherine K, NP  furosemide  (LASIX ) 20 MG tablet Take 1 tablet by mouth daily. May take additional tablet as needed for swelling 03/05/24  Yes Gollan, Timothy J, MD  gabapentin  (NEURONTIN ) 300 MG capsule Take 1 capsule (300 mg total) by mouth 2 (two) times daily. 02/17/24  Yes Janit Thresa HERO, DPM  glipiZIDE  (GLUCOTROL  XL) 10 MG 24 hr tablet TAKE 1 TABLET BY MOUTH EVERY MORNING WITH BREAKFAST FOR DIABETES 04/30/24  Yes [name], Katherine K, NP  isosorbide  mononitrate (IMDUR ) 60 MG 24 hr tablet Take 1.5 tablets (90 mg total) by mouth daily. 03/05/24  Yes Gollan, Timothy J, MD  metoprolol  succinate (TOPROL -XL) 100 MG 24 hr tablet TAKE 1 TABLET BY MOUTH EVERY MORNING WITH OR IMMEDIATELY FOLLOWING A MEAL 03/05/24  Yes Gollan, Timothy J, MD  moxifloxacin (VIGAMOX) 0.5 % ophthalmic solution Place 1 drop into the right eye every 1 hour x 3 doses.   Yes [provider]  nitroGLYCERIN  (NITROSTAT ) 0.4 MG SL tablet Place 1 tablet (0.4 mg total) under the tongue every 5 (five) minutes as needed for chest pain. 03/05/24  Yes Gollan, Timothy J, MD  ondansetron  (ZOFRAN -ODT) 4 MG disintegrating tablet Take 1 tablet (4 mg total) by mouth every 8 (eight) hours as needed for nausea or vomiting. 07/12/24  Yes [name], Katherine K, NP  pantoprazole  (PROTONIX ) 20 MG tablet TAKE 1 TABLET BY MOUTH DAILY FOR HEARTBURN 07/06/24  Yes [name], Katherine K, NP  sertraline  (ZOLOFT ) 100 MG tablet TAKE 1 TABLET BY MOUTH DAILY FOR ANXIETY AND  DEPRESSION 07/06/24  Yes Gretta Comer POUR, NP  vitamin B-12 (CYANOCOBALAMIN) 1000 MCG tablet Take 1,000 mcg by mouth daily.   Yes [provider]  Bempedoic Acid -Ezetimibe  (NEXLIZET ) 180-10 MG TABS Take 1 tablet by mouth daily. Pt has scheduled an office visit with provider in April, 2025 Patient not taking: Reported on 07/12/2024 01/02/24   Verlin Lonni BIRCH, MD  Blood Glucose Monitoring Suppl DEVI 1 each by Does not apply route in the morning, at noon, and at bedtime. May substitute to any manufacturer covered by patient's insurance. 02/16/23   [name], Katherine K, NP  Glucose Blood (BLOOD GLUCOSE TEST STRIPS) STRP 1 each by In Vitro route in the morning, at noon, and at bedtime. May substitute to any manufacturer covered by patient's insurance. 02/16/23   Gretta Comer POUR, NP  Lancets Misc. MISC 1 each by Does not apply route in the morning, at noon, and at bedtime. May substitute to any manufacturer covered by patient's insurance. 02/16/23   [name], Katherine K, NP  ROCKLATAN 0.02-0.005 % SOLN Place 1 drop into the right eye at bedtime. Patient not taking: Reported on 07/12/2024 04/16/23   [provider]  triamcinolone  cream (KENALOG ) 0.1 % Apply 1 Application topically 2 (two) times daily. Patient not taking: Reported on 07/12/2024 08/19/23   Janit Thresa HERO, DPM  loratadine  (CLARITIN ) 10 MG tablet Take 10 mg by mouth as needed.   02/15/12  [provider]    Physical Exam: Vitals:   07/12/24 1626 07/12/24 1700 07/12/24 1800 07/12/24 1900  BP:  116/68 121/66 121/74  Pulse:  81 77 (!) 105  Resp:  (!) 22 (!) 30   Temp:      TempSrc:      SpO2:  100% 100% 99%  Weight: 75.8 kg     Height: 5' 9 (1.753 m)  Constitutional: NAD, calm, comfortable Vitals:   07/12/24 1626 07/12/24 1700 07/12/24 1800 07/12/24 1900  BP:  116/68 121/66 121/74  Pulse:  81 77 (!) 105  Resp:  (!) 22 (!) 30   Temp:      TempSrc:      SpO2:  100% 100% 99%  Weight: 75.8 kg      Height: 5' 9 (1.753 m)      Eyes: PERRL, lids and conjunctivae normal ENMT: Mucous membranes are dry, noted dry coffee ground material Neck: normal, supple, no masses, no thyromegaly Respiratory: clear to auscultation bilaterally, no wheezing, no crackles. Normal respiratory effort. No accessory muscle use.  Cardiovascular: Regular rate and rhythm, no murmurs / rubs / gallops. No extremity edema. 2+ pedal pulses.  Abdomen: + tenderness epigastric tenderness, no masses palpated. No hepatosplenomegaly. Bowel sounds positive.  Musculoskeletal: no clubbing / cyanosis. No joint deformity upper and lower extremities. Good ROM, no contractures. Normal muscle tone.  Skin: no rashes, lesions, ulcers. No induration Neurologic: CN grossly intact. Sensation intact, Strength 5/5 in all 4.  Psychiatric: Normal judgment and insight. Alert and oriented x 3. Normal mood.    Labs on Admission: I have personally reviewed following labs and imaging studies  CBC: Recent Labs  Lab 07/12/24 1042 07/12/24 1627  WBC 4.1 5.1  NEUTROABS 2.8  --   HGB 9.5* 7.8*  HCT 28.8* 23.9*  MCV 96.8 100.0  PLT 252.0 202   Basic Metabolic Panel: Recent Labs  Lab 07/12/24 1042 07/12/24 1627  NA 138 137  K 5.6 No hemolysis seen* 4.7  CL 100 101  CO2 31 26  GLUCOSE 312* 274*  BUN 45* 60*  CREATININE 1.24* 1.34*  CALCIUM 8.9 8.5*   GFR: Estimated Creatinine Clearance: 29.2 mL/min (A) (by C-G formula based on SCr of 1.34 mg/dL (H)). Liver Function Tests: Recent Labs  Lab 07/12/24 1627  AST 21  ALT 10  ALKPHOS 19*  BILITOT 1.2  PROT 5.4*  ALBUMIN 3.1*   Recent Labs  Lab 07/12/24 1042  LIPASE 25.0   No results for input(s): AMMONIA in the last 168 hours. Coagulation Profile: Recent Labs  Lab 07/12/24 1627  INR 1.9*   Cardiac Enzymes: No results for input(s): CKTOTAL, CKMB, CKMBINDEX, TROPONINI in the last 168 hours. BNP (last 3 results) Recent Labs    02/08/24 1131 05/23/24 1049   PROBNP 621.0* 594.0*   HbA1C: No results for input(s): HGBA1C in the last 72 hours. CBG: No results for input(s): GLUCAP in the last 168 hours. Lipid Profile: No results for input(s): CHOL, HDL, LDLCALC, TRIG, CHOLHDL, LDLDIRECT in the last 72 hours. Thyroid  Function Tests: No results for input(s): TSH, T4TOTAL, FREET4, T3FREE, THYROIDAB in the last 72 hours. Anemia Panel: No results for input(s): VITAMINB12, FOLATE, FERRITIN, TIBC, IRON , RETICCTPCT in the last 72 hours. Urine analysis:    Component Value Date/Time   COLORURINE AMBER (A) 04/30/2021 1801   APPEARANCEUR CLEAR (A) 04/30/2021 1801   LABSPEC 1.027 04/30/2021 1801   PHURINE 5.0 04/30/2021 1801   GLUCOSEU >=500 (A) 04/30/2021 1801   HGBUR NEGATIVE 04/30/2021 1801   HGBUR small 04/23/2009 0817   BILIRUBINUR neg 05/23/2024 1044   KETONESUR NEGATIVE 04/30/2021 1801   PROTEINUR Positive (A) 05/23/2024 1044   PROTEINUR 30 (A) 04/30/2021 1801   UROBILINOGEN 0.2 05/23/2024 1044   UROBILINOGEN 0.2 04/23/2009 0817   NITRITE neg 05/23/2024 1044   NITRITE POSITIVE (A) 04/30/2021 1801   LEUKOCYTESUR Negative 05/23/2024 1044   LEUKOCYTESUR NEGATIVE 04/30/2021  1801    Radiological Exams on Admission: No results found.  EKG: Independently reviewed.   Assessment/Plan  Acute Upper Gi bleed -hx of prior Gi bleed - admit to progressive care  - ppi bid iv  - serial h/h  -transfuse if < 8 in setting of tachycardia and CAD  -supportive gently ivfs  while npo  -Gi dr Therisa  to see in am  -hold anti-hypertensive medication    Anemia of blood loss - s/p 1 unit of prbc  -drop in hgb 2 points  9.5-7.8 -continue to monitor h/h  Atrial Fib RVR - will treat with ivfs/and PRBC - prn low dose metoprolol  if above interventions not effective  -hold apixaban     CAD s/p stent -no active issues  -  Hx of DVT -one year ago  - completed treatment   CKDIIIb Hypertension  -currently  normotensive -insetting of gi bleed hold anti -hypertensive medications   GERD -ppi   DMII -iss/fs  -hold other medications currently until taking po   IBS -no active issues   Hx of MALT  NHL S/p chemo xrt  CKDIIIb -at baseline   HLD -resume home regimen once med rec completed and taking po    DVT prophylaxis: scd Code Status: .smtf Family Communication:none at bedside Disposition Plan: patient  expected to be admitted greater than 2 midnights  Consults called: DR Therisa Admission status: progressive care   Camila DELENA Ned MD [facility]   If 7PM-7AM, please contact night-coverage [email or web address] Password [facility]  07/12/2024, 7:31 PM

## 2024-07-12 NOTE — Assessment & Plan Note (Signed)
 Positive stool card the office today.  Given coffee-ground emesis, coupled with positive stool card and symptoms, will refer to GI urgently for evaluation.

## 2024-07-13 ENCOUNTER — Encounter
Admission: EM | Disposition: A | Discharge status: Home / Self Care | Payer: Self-pay | Source: Home / Self Care | Attending: Student

## 2024-07-13 ENCOUNTER — Inpatient Hospital Stay: Admitting: Anesthesiology

## 2024-07-13 ENCOUNTER — Other Ambulatory Visit: Payer: Self-pay

## 2024-07-13 ENCOUNTER — Ambulatory Visit: Payer: Self-pay | Admitting: Primary Care

## 2024-07-13 ENCOUNTER — Encounter: Payer: Self-pay | Admitting: Internal Medicine

## 2024-07-13 ENCOUNTER — Inpatient Hospital Stay

## 2024-07-13 DIAGNOSIS — D490 Neoplasm of unspecified behavior of digestive system: Secondary | ICD-10-CM

## 2024-07-13 DIAGNOSIS — K317 Polyp of stomach and duodenum: Secondary | ICD-10-CM

## 2024-07-13 DIAGNOSIS — D649 Anemia, unspecified: Secondary | ICD-10-CM

## 2024-07-13 DIAGNOSIS — K920 Hematemesis: Secondary | ICD-10-CM | POA: Diagnosis not present

## 2024-07-13 DIAGNOSIS — I4891 Unspecified atrial fibrillation: Secondary | ICD-10-CM | POA: Diagnosis not present

## 2024-07-13 DIAGNOSIS — K6389 Other specified diseases of intestine: Secondary | ICD-10-CM | POA: Diagnosis not present

## 2024-07-13 HISTORY — PX: ESOPHAGOGASTRODUODENOSCOPY: SHX5428

## 2024-07-13 LAB — CBC
HCT: 24.9 % — ABNORMAL LOW (ref 36.0–46.0)
Hemoglobin: 7.9 g/dL — ABNORMAL LOW (ref 12.0–15.0)
MCH: 30.5 pg (ref 26.0–34.0)
MCHC: 31.7 g/dL (ref 30.0–36.0)
MCV: 96.1 fL (ref 80.0–100.0)
Platelets: 190 K/uL (ref 150–400)
RBC: 2.59 MIL/uL — ABNORMAL LOW (ref 3.87–5.11)
RDW: 19.1 % — ABNORMAL HIGH (ref 11.5–15.5)
WBC: 5.2 K/uL (ref 4.0–10.5)
nRBC: 0 % (ref 0.0–0.2)

## 2024-07-13 LAB — MAGNESIUM: Magnesium: 1.8 mg/dL (ref 1.7–2.4)

## 2024-07-13 LAB — GLUCOSE, CAPILLARY
Glucose-Capillary: 108 mg/dL — ABNORMAL HIGH (ref 70–99)
Glucose-Capillary: 111 mg/dL — ABNORMAL HIGH (ref 70–99)
Glucose-Capillary: 137 mg/dL — ABNORMAL HIGH (ref 70–99)
Glucose-Capillary: 170 mg/dL — ABNORMAL HIGH (ref 70–99)

## 2024-07-13 LAB — BASIC METABOLIC PANEL WITH GFR
Anion gap: 8 (ref 5–15)
BUN: 61 mg/dL — ABNORMAL HIGH (ref 8–23)
CO2: 25 mmol/L (ref 22–32)
Calcium: 8.1 mg/dL — ABNORMAL LOW (ref 8.9–10.3)
Chloride: 108 mmol/L (ref 98–111)
Creatinine, Ser: 1.28 mg/dL — ABNORMAL HIGH (ref 0.44–1.00)
GFR, Estimated: 40 mL/min — ABNORMAL LOW (ref >60)
Glucose, Bld: 191 mg/dL — ABNORMAL HIGH (ref 70–99)
Potassium: 4.2 mmol/L (ref 3.5–5.1)
Sodium: 141 mmol/L (ref 135–145)

## 2024-07-13 LAB — IRON AND TIBC
Iron: 35 ug/dL (ref 28–170)
Saturation Ratios: 12 % (ref 10.4–31.8)
TIBC: 294 ug/dL (ref 250–450)
UIBC: 259 ug/dL

## 2024-07-13 LAB — HEMOGLOBIN AND HEMATOCRIT, BLOOD
HCT: 26.1 % — ABNORMAL LOW (ref 36.0–46.0)
Hemoglobin: 8.5 g/dL — ABNORMAL LOW (ref 12.0–15.0)

## 2024-07-13 LAB — VITAMIN D 25 HYDROXY (VIT D DEFICIENCY, FRACTURES): Vit D, 25-Hydroxy: 50.25 ng/mL (ref 30–100)

## 2024-07-13 LAB — CBG MONITORING, ED
Glucose-Capillary: 193 mg/dL — ABNORMAL HIGH (ref 70–99)
Glucose-Capillary: 163 mg/dL — ABNORMAL HIGH (ref 70–99)

## 2024-07-13 LAB — FERRITIN: Ferritin: 26 ng/mL (ref 11–307)

## 2024-07-13 LAB — FOLATE: Folate: 12.3 ng/mL (ref >5.9)

## 2024-07-13 LAB — HEMOGLOBIN
Hemoglobin: 7.8 g/dL — ABNORMAL LOW (ref 12.0–15.0)
Hemoglobin: 8.6 g/dL — ABNORMAL LOW (ref 12.0–15.0)

## 2024-07-13 LAB — LACTATE DEHYDROGENASE: LDH: 112 U/L (ref 98–192)

## 2024-07-13 LAB — PHOSPHORUS: Phosphorus: 2.7 mg/dL (ref 2.5–4.6)

## 2024-07-13 SURGERY — EGD (ESOPHAGOGASTRODUODENOSCOPY)
Anesthesia: General

## 2024-07-13 MED ORDER — DEXMEDETOMIDINE HCL IN NACL 80 MCG/20ML IV SOLN
INTRAVENOUS | Status: DC | PRN
  Administered 2024-07-13: 4 ug via INTRAVENOUS

## 2024-07-13 MED ORDER — IOHEXOL 9 MG/ML PO SOLN
500.0000 mL | ORAL | Status: AC
  Administered 2024-07-13 (×2): 500 mL via ORAL

## 2024-07-13 MED ORDER — PROPOFOL 10 MG/ML IV BOLUS
INTRAVENOUS | Status: DC | PRN
  Administered 2024-07-13 (×2): 10 mg via INTRAVENOUS
  Administered 2024-07-13: 60 mg via INTRAVENOUS

## 2024-07-13 MED ORDER — SODIUM CHLORIDE 0.9 % IV SOLN: INTRAVENOUS | Status: AC

## 2024-07-13 MED ORDER — SODIUM CHLORIDE 0.9 % IV SOLN
300.0000 mg | INTRAVENOUS | Status: AC
  Administered 2024-07-13 – 2024-07-15 (×3): 300 mg via INTRAVENOUS
  Filled 2024-07-13: qty 300
  Filled 2024-07-13: qty 15
  Filled 2024-07-13: qty 300

## 2024-07-13 MED ORDER — IOHEXOL 300 MG/ML  SOLN
80.0000 mL | Freq: Once | INTRAMUSCULAR | Status: AC | PRN
  Administered 2024-07-13: 80 mL via INTRAVENOUS

## 2024-07-13 NOTE — Anesthesia Postprocedure Evaluation (Signed)
 Anesthesia Post Note  Patient: [name] [name]  Procedure(s) Performed: EGD (ESOPHAGOGASTRODUODENOSCOPY)  Patient location during evaluation: PACU Anesthesia Type: General Level of consciousness: awake and sedated Pain management: satisfactory to patient Vital Signs Assessment: post-procedure vital signs reviewed and stable Respiratory status: spontaneous breathing and patient connected to nasal cannula oxygen Cardiovascular status: stable Anesthetic complications: no   No notable events documented.   Last Vitals:  Vitals:   07/13/24 1142 07/13/24 1318  BP: 113/61 131/84  Pulse: (!) 116 93  Resp: 16   Temp:  36.8 C  SpO2:  97%    Last Pain:  Vitals:   07/13/24 1318  TempSrc: Oral  PainSc:                  VAN STAVEREN,Merlie Noga

## 2024-07-13 NOTE — Consult Note (Signed)
  [facility] CONSULT NOTE  Patient Care Team: Gretta Comer POUR, NP as PCP - General (Internal Medicine) Verlin Lonni BIRCH, MD as PCP - Cardiology (Cardiology) Alline Lenis, MD (Inactive) as Consulting Physician (Urology) Portia Fireman, OD as Consulting Physician (Optometry) Homsher, Wanda, RN as VBCI Care Management  CHIEF COMPLAINTS/PURPOSE OF CONSULTATION: gastric mass  Oncology History   No history exists.    HISTORY OF PRESENTING ILLNESS:   [name] [name] 89 y.o.  female pleasant patient with a medical history significant of  Persistent Atrial fibrillation on eliquis , ,DMII, remote history of MALT lymphoma,CKDIIIa, IDA who presents to ED with complaints of coffee grounds emesis as well as dark stools.  Of note patient was diagnosed with a mass in the stomach in November 2024 when she presented for similar complaints of anemia.  Biopsy at the time however inconclusive.   Patient had an endoscopy this morning-again showed large sessile mass in the stomach.  No biopsies were taken as patient was within the window of Eliquis .  Patient is currently accompanied by her family.  She is quite conversant.   Review of Systems  Constitutional:  Negative for chills, diaphoresis, fever, malaise/fatigue and weight loss.  HENT:  Negative for nosebleeds and sore throat.   Eyes:  Negative for double vision.  Respiratory:  Negative for cough, hemoptysis, sputum production, shortness of breath and wheezing.   Cardiovascular:  Negative for chest pain, palpitations, orthopnea and leg swelling.  Gastrointestinal:  Positive for abdominal pain, heartburn, melena, nausea and vomiting. Negative for blood in stool, constipation and diarrhea.  Genitourinary:  Negative for dysuria, frequency and urgency.  Musculoskeletal:  Negative for back pain and joint pain.  Skin: Negative.  Negative for itching and rash.  Neurological:  Negative for dizziness, tingling, focal weakness,  weakness and headaches.  Endo/Heme/Allergies:  Does not bruise/bleed easily.  Psychiatric/Behavioral:  Negative for depression. The patient is not nervous/anxious and does not have insomnia.     MEDICAL HISTORY:  Past Medical History:  Diagnosis Date   Acute bilateral thoracic back pain 08/16/2019   Acute post-hemorrhagic anemia 10/10/2023   Allergy     Cipro, Plavix, Statins   Anemia    Arthritis    Atrial fibrillation (HCC)    CAD (coronary artery disease)    a. s/p tandem Promus DES to LAD in 2009 by Dr. Obie;  b.  LHC (03/22/14):  prox LAD 30%, mid LAD 40%, LAD stent ok with dist 20% ISR, apical LAD occluded with L-L collats filling apical vessel (too small for PCI), mid RCA 20%, EF 70%.  Med Rx   Cancer [facility])    Chronic kidney disease, stage 3b (HCC)    Colon polyps    Diabetes mellitus    Diagnosed 2012   Diverticulosis of colon (without mention of hemorrhage)    DVT (deep venous thrombosis) (HCC) 12/2022   right leg   GERD (gastroesophageal reflux disease)    Glaucoma    Grade I diastolic dysfunction    Grade I diastolic dysfunction    Herpes zoster without complication 04/24/2020   Hyperlipidemia    intol of statins   Hypertension    Kidney stones    Left atrial dilation    Moderate aortic regurgitation    Motion sickness    back seat cars   NHL (non-Hodgkin's lymphoma) (HCC) dx'd 2003   chemo/xrt comp 2003   Personal history of colonic polyps    Proctitis    Pruritic intertrigo 07/18/2014   Urticaria  Wears dentures    Full upper, partial lower   Wears hearing aid in both ears     SURGICAL HISTORY: Past Surgical History:  Procedure Laterality Date   BIOPSY  10/15/2023   Procedure: BIOPSY;  Surgeon: Aundria, Ladell POUR, MD;  Location: [facility] ENDOSCOPY;  Service: Gastroenterology;;   cardiac cath-neg     CATARACT EXTRACTION Bilateral    COLONOSCOPY WITH PROPOFOL  N/A 10/15/2023   Procedure: COLONOSCOPY WITH PROPOFOL ;  Surgeon: Toledo, Ladell POUR, MD;   Location: [facility] ENDOSCOPY;  Service: Gastroenterology;  Laterality: N/A;   CORONARY ANGIOPLASTY WITH STENT PLACEMENT     x 2   dexa-neg     ESOPHAGOGASTRODUODENOSCOPY N/A 07/13/2024   Procedure: EGD (ESOPHAGOGASTRODUODENOSCOPY);  Surgeon: Jinny Carmine, MD;  Location: [facility] ENDOSCOPY;  Service: Endoscopy;  Laterality: N/A;   ESOPHAGOGASTRODUODENOSCOPY (EGD) WITH PROPOFOL  N/A 10/15/2023   Procedure: ESOPHAGOGASTRODUODENOSCOPY (EGD) WITH PROPOFOL ;  Surgeon: Toledo, Ladell POUR, MD;  Location: [facility] ENDOSCOPY;  Service: Gastroenterology;  Laterality: N/A;   HEMOSTASIS CLIP PLACEMENT  10/15/2023   Procedure: HEMOSTASIS CLIP PLACEMENT;  Surgeon: Aundria, Ladell POUR, MD;  Location: [facility] ENDOSCOPY;  Service: Gastroenterology;;   KNEE SURGERY  10/30/2003   left   LEFT HEART CATHETERIZATION WITH CORONARY ANGIOGRAM N/A 03/22/2014   Procedure: LEFT HEART CATHETERIZATION WITH CORONARY ANGIOGRAM;  Surgeon: Lonni JONETTA Cash, MD;  Location: [facility] CATH LAB;  Service: Cardiovascular;  Laterality: N/A;   POLYPECTOMY  10/15/2023   Procedure: POLYPECTOMY;  Surgeon: Toledo, Ladell POUR, MD;  Location: [facility] ENDOSCOPY;  Service: Gastroenterology;;   stress cardiolite      VAGINAL HYSTERECTOMY     partial, fibroids, one ovary left    SOCIAL HISTORY: Social History   Socioeconomic History   Marital status: Widowed    Spouse name: Not on file   Number of children: 0   Years of education: Not on file   Highest education level: 12th grade  Occupational History   Occupation: Retired    Associate Professor: RETIRED   Occupation: retired  Tobacco Use   Smoking status: Never   Smokeless tobacco: Never  Vaping Use   Vaping status: Never Used  Substance and Sexual Activity   Alcohol use: No   Drug use: No   Sexual activity: Not Currently  Other Topics Concern   Not on file  Social History Narrative   Not on file   Social Drivers of Health   Financial Resource Strain: Low Risk  (05/20/2024)   Overall Financial Resource  Strain (CARDIA)    Difficulty of Paying Living Expenses: Not hard at all  Food Insecurity: No Food Insecurity (05/20/2024)   Hunger Vital Sign    Worried About Running Out of Food in the Last Year: Never true    Ran Out of Food in the Last Year: Never true  Transportation Needs: No Transportation Needs (05/20/2024)   PRAPARE - Administrator, Civil Service (Medical): No    Lack of Transportation (Non-Medical): No  Physical Activity: Inactive (05/20/2024)   Exercise Vital Sign    Days of Exercise per Week: 0 days    Minutes of Exercise per Session: Not on file  Stress: No Stress Concern Present (05/20/2024)   [employer] of Occupational Health - Occupational Stress Questionnaire    Feeling of Stress: Not at all  Social Connections: Moderately Integrated (05/20/2024)   Social Connection and Isolation Panel    Frequency of Communication with Friends and Family: More than three times a week    Frequency of Social Gatherings with  Friends and Family: More than three times a week    Attends Religious Services: More than 4 times per year    Active Member of Clubs or Organizations: Yes    Attends Banker Meetings: More than 4 times per year    Marital Status: Widowed  Intimate Partner Violence: Not At Risk (10/26/2023)   Humiliation, Afraid, Rape, and Kick questionnaire    Fear of Current or Ex-Partner: No    Emotionally Abused: No    Physically Abused: No    Sexually Abused: No    FAMILY HISTORY: Family History  Problem Relation Age of Onset   Cancer Mother        jaw   Hypertension Father    Heart attack Father    Heart attack Sister    Cancer Sister        unknown primary-mets throughout body   Colon cancer Neg Hx    Esophageal cancer Neg Hx    Pancreatic cancer Neg Hx    Stomach cancer Neg Hx    Liver disease Neg Hx     ALLERGIES:  is allergic to other, shellfish allergy , cephalexin , ciprofloxacin, clopidogrel bisulfate, ezetimibe -simvastatin,  lidocaine , nitrofurantoin, pregabalin, celecoxib, codeine, codeine phosphate, hydrocod poli-chlorphe poli er, propoxyphene, propoxyphene n-acetaminophen , rofecoxib, sulfa antibiotics, sulfamethoxazole, and sulfonamide derivatives.  MEDICATIONS:  Current Facility-Administered Medications  Medication Dose Route Frequency Provider Last Rate Last Admin   0.9 %  sodium chloride  infusion (Manually program via Guardrails IV Fluids)   Intravenous Once Debby Camila LABOR, MD   Held at 07/12/24 2108   0.9 %  sodium chloride  infusion   Intravenous Continuous Von Bellis, MD 75 mL/hr at 07/13/24 0826 Restarted at 07/13/24 1038   albuterol  (PROVENTIL ) (2.5 MG/3ML) 0.083% nebulizer solution 2.5 mg  2.5 mg Nebulization Q2H PRN Thomas, Sara-Maiz A, MD       insulin  aspart (novoLOG ) injection 0-6 Units  0-6 Units Subcutaneous Q4H Debby Camila A, MD   1 Units at 07/13/24 9167   pantoprazole  (PROTONIX ) injection 40 mg  40 mg Intravenous Q12H Thomas, Sara-Maiz A, MD   40 mg at 07/13/24 1014   promethazine  (PHENERGAN ) 6.25 mg/NS 50 mL IVPB  6.25 mg Intravenous Q6H PRN Bradler, Evan K, MD 150 mL/hr at 07/13/24 1624 6.25 mg at 07/13/24 1624   sertraline  (ZOLOFT ) tablet 100 mg  100 mg Oral Daily Debby Camila A, MD   100 mg at 07/13/24 1014    PHYSICAL EXAMINATION:   Vitals:   07/13/24 1142 07/13/24 1318  BP: 113/61 131/84  Pulse: (!) 116 93  Resp: 16 16  Temp:  98.2 F (36.8 C)  SpO2:  97%   Filed Weights   07/12/24 1626  Weight: 167 lb (75.8 kg)    Physical Exam Vitals and nursing note reviewed.  HENT:     Head: Normocephalic and atraumatic.     Mouth/Throat:     Pharynx: Oropharynx is clear.  Eyes:     Extraocular Movements: Extraocular movements intact.     Pupils: Pupils are equal, round, and reactive to light.  Cardiovascular:     Rate and Rhythm: Normal rate and regular rhythm.  Pulmonary:     Comments: Decreased breath sounds bilaterally.  Abdominal:     Palpations: Abdomen  is soft.  Musculoskeletal:        General: Normal range of motion.     Cervical back: Normal range of motion.  Skin:    General: Skin is warm.  Neurological:  General: No focal deficit present.     Mental Status: She is alert and oriented to person, place, and time.  Psychiatric:        Behavior: Behavior normal.        Judgment: Judgment normal.     LABORATORY DATA:  I have reviewed the data as listed Lab Results  Component Value Date   WBC 5.2 07/13/2024   HGB 8.6 (L) 07/13/2024   HCT 24.9 (L) 07/13/2024   MCV 96.1 07/13/2024   PLT 190 07/13/2024   Recent Labs    10/10/23 0939 10/11/23 0503 10/13/23 0435 02/08/24 1131 07/12/24 1042 07/12/24 1627 07/13/24 0408  NA 137   < > 140 139 138 137 141  K 4.1   < > 3.7 4.5 5.6 No hemolysis seen* 4.7 4.2  CL 106   < > 107 102 100 101 108  CO2 25   < > 26 31 31 26 25   GLUCOSE 174*   < > 140* 167* 312* 274* 191*  BUN 66*   < > 27* 19 45* 60* 61*  CREATININE 1.33*   < > 1.28* 1.14 1.24* 1.34* 1.28*  CALCIUM 8.0*   < > 8.4* 9.3 8.9 8.5* 8.1*  GFRNONAA 38*   < > 40*  --   --  38* 40*  PROT 5.2*  --   --  6.5  --  5.4*  --   ALBUMIN 2.9*  --   --  4.4  --  3.1*  --   AST 13*  --   --  15  --  21  --   ALT 8  --   --  7  --  10  --   ALKPHOS 18*  --   --  34*  --  19*  --   BILITOT 0.4  --   --  0.7  --  1.2  --   BILIDIR  --   --   --  0.2  --   --   --    < > = values in this interval not displayed.    RADIOGRAPHIC STUDIES: I have personally reviewed the radiological images as listed and agreed with the findings in the report. No results found.   No problem-specific Assessment & Plan notes found for this encounter.  # 89 year old female patient with history of A-fib-on Eliquis ; CKD-and history of gastric mass is currently admitted to hospital for hematemesis and melena-with acute on chronic anemia.  # .EGD shows a sessile mass in the stomach.  However biopsy not done-as patient was within the window of Eliquis .    # Iron  deficient anemia secondary to above-status post PRBC transfusion.  # History of A-fib on Eliquis -currently on hold.   # Recommendations/plan:  # Await the results of the CT scan as ordered for further evaluation-to check for any metastatic disease.  If no obvious metastatic disease noted-a repeat endoscopy is reasonable for further evaluation.   # Given patient's fairly decent performance status-especially at her age-I think it be reasonable to at least make a diagnosis of her gastric mass.  Discussed with Dr. Onita GI.  # Recommend proceeding with iron  infusion-given the severe anemia.  Thank you Dr.Kumar  for allowing me to participate in the care of your pleasant patient. Please do not hesitate to contact me with questions or concerns in the interim.  Above plan of care was discussed with patient and  in detail.  My contact information was given to the  patient/family.      Cindy JONELLE Joe, MD 07/13/2024 4:50 PM

## 2024-07-13 NOTE — Consult Note (Signed)
 Rogelia Copping, MD [facility]  [address] [place], [place] [address] Phone: [phone] Fax : [phone]  Consultation  Referring Provider:     Dr. Debby Primary Care Physician:  Gretta Comer POUR, NP Primary Gastroenterologist:  Dr. Aundria         Reason for Consultation:     GI bleeding  Date of Admission:  07/12/2024 Date of Consultation:  07/13/2024         HPI:   [name] [name] is a 89 y.o. female who has a history of an EGD and colonoscopy in November 2024 by Dr. Aundria with a gastric mass found at that time.  Biopsies of the gastric mass were shown to be hyperplastic and it does not appear there has been follow-up since then.  The patient was seen prior to that by Dr. Albertus in [place] and a workup for iron  deficiency anemia was deferred due to the patient being hesitant about proceeding with any procedures due to her age.  The patient is presently on Eliquis  and had her last dose of Eliquis  yesterday. The history is given through the daughter who is at the patient's bedside in the emergency department and reports that the patient started to have multiple episodes of vomiting black dark material.  She also reports that she had an episode of vomiting black material in the emergency transport vehicle. Since being in the hospital the patient has not had any further sign of GI bleeding. The patient's hemoglobin 7 months ago was 11.8 with it down to 9.78-month ago and the patient came in with a hemoglobin of 9.5 that went down to 7.7 overnight. I am now asked to see the patient for her hematemesis.  Past Medical History:  Diagnosis Date   Acute bilateral thoracic back pain 08/16/2019   Acute post-hemorrhagic anemia 10/10/2023   Allergy     Cipro, Plavix, Statins   Anemia    Arthritis    Atrial fibrillation (HCC)    CAD (coronary artery disease)    a. s/p tandem Promus DES to LAD in 2009 by Dr. Obie;  b.  LHC (03/22/14):  prox LAD 30%, mid LAD 40%, LAD stent ok with  dist 20% ISR, apical LAD occluded with L-L collats filling apical vessel (too small for PCI), mid RCA 20%, EF 70%.  Med Rx   Cancer [facility])    Chronic kidney disease, stage 3b (HCC)    Colon polyps    Diabetes mellitus    Diagnosed 2012   Diverticulosis of colon (without mention of hemorrhage)    DVT (deep venous thrombosis) (HCC) 12/2022   right leg   GERD (gastroesophageal reflux disease)    Glaucoma    Grade I diastolic dysfunction    Grade I diastolic dysfunction    Herpes zoster without complication 04/24/2020   Hyperlipidemia    intol of statins   Hypertension    Kidney stones    Left atrial dilation    Moderate aortic regurgitation    Motion sickness    back seat cars   NHL (non-Hodgkin's lymphoma) (HCC) dx'd 2003   chemo/xrt comp 2003   Personal history of colonic polyps    Proctitis    Pruritic intertrigo 07/18/2014   Urticaria    Wears dentures    Full upper, partial lower   Wears hearing aid in both ears     Past Surgical History:  Procedure Laterality Date   BIOPSY  10/15/2023   Procedure: BIOPSY;  Surgeon: Aundria, Ladell POUR, MD;  Location: [facility] ENDOSCOPY;  Service: Gastroenterology;;   cardiac cath-neg     CATARACT EXTRACTION Bilateral    COLONOSCOPY WITH PROPOFOL  N/A 10/15/2023   Procedure: COLONOSCOPY WITH PROPOFOL ;  Surgeon: Toledo, Ladell POUR, MD;  Location: [facility] ENDOSCOPY;  Service: Gastroenterology;  Laterality: N/A;   CORONARY ANGIOPLASTY WITH STENT PLACEMENT     x 2   dexa-neg     ESOPHAGOGASTRODUODENOSCOPY (EGD) WITH PROPOFOL  N/A 10/15/2023   Procedure: ESOPHAGOGASTRODUODENOSCOPY (EGD) WITH PROPOFOL ;  Surgeon: Toledo, Ladell POUR, MD;  Location: [facility] ENDOSCOPY;  Service: Gastroenterology;  Laterality: N/A;   HEMOSTASIS CLIP PLACEMENT  10/15/2023   Procedure: HEMOSTASIS CLIP PLACEMENT;  Surgeon: Aundria, Ladell POUR, MD;  Location: [facility] ENDOSCOPY;  Service: Gastroenterology;;   KNEE SURGERY  10/30/2003   left   LEFT HEART CATHETERIZATION WITH CORONARY  ANGIOGRAM N/A 03/22/2014   Procedure: LEFT HEART CATHETERIZATION WITH CORONARY ANGIOGRAM;  Surgeon: Lonni JONETTA Cash, MD;  Location: [facility] CATH LAB;  Service: Cardiovascular;  Laterality: N/A;   POLYPECTOMY  10/15/2023   Procedure: POLYPECTOMY;  Surgeon: Toledo, Teodoro K, MD;  Location: [facility] ENDOSCOPY;  Service: Gastroenterology;;   stress cardiolite      VAGINAL HYSTERECTOMY     partial, fibroids, one ovary left    Prior to Admission medications   Medication Sig Start Date End Date Taking? Authorizing Provider  apixaban  (ELIQUIS ) 5 MG TABS tablet Take 1 tablet (5 mg total) by mouth 2 (two) times daily. 03/05/24  Yes Gollan, Timothy J, MD  brimonidine  (ALPHAGAN ) 0.2 % ophthalmic solution Place 1 drop into the right eye in the morning and at bedtime.   Yes [provider]  Cholecalciferol (VITAMIN D -3) 1000 UNITS CAPS Take 1 capsule by mouth daily.   Yes [provider]  CRANBERRY PO Take 1 tablet by mouth daily.   Yes [provider]  diclofenac  Sodium (VOLTAREN ) 1 % GEL APPLY 2 GRAMS TOPICALLY THREE TIMES A DAY AS NEEDED 02/05/23  Yes [name], Katherine K, NP  Dulaglutide  (TRULICITY ) 0.75 MG/0.5ML SOAJ Inject 0.75 mg into the skin once a week. for diabetes. 03/08/24  Yes Gretta Comer POUR, NP  ferrous sulfate  325 (65 FE) MG tablet Take 1 tablet (325 mg total) by mouth daily with breakfast. 10/28/22  Yes Kerman Vina HERO, NP  fexofenadine  (ALLEGRA ) 180 MG tablet Take 1 tablet (180 mg total) by mouth as needed for allergies or rhinitis. 04/03/19  Yes [name], Katherine K, NP  furosemide  (LASIX ) 20 MG tablet Take 1 tablet by mouth daily. May take additional tablet as needed for swelling 03/05/24  Yes Gollan, Evalene PARAS, MD  gabapentin  (NEURONTIN ) 300 MG capsule Take 1 capsule (300 mg total) by mouth 2 (two) times daily. 02/17/24  Yes Janit Thresa HERO, DPM  glipiZIDE  (GLUCOTROL  XL) 10 MG 24 hr tablet TAKE 1 TABLET BY MOUTH EVERY MORNING WITH BREAKFAST FOR DIABETES 04/30/24  Yes [name],  Katherine K, NP  isosorbide  mononitrate (IMDUR ) 60 MG 24 hr tablet Take 1.5 tablets (90 mg total) by mouth daily. 03/05/24  Yes Gollan, Timothy J, MD  metoprolol  succinate (TOPROL -XL) 100 MG 24 hr tablet TAKE 1 TABLET BY MOUTH EVERY MORNING WITH OR IMMEDIATELY FOLLOWING A MEAL 03/05/24  Yes Gollan, Timothy J, MD  moxifloxacin (VIGAMOX) 0.5 % ophthalmic solution Place 1 drop into the right eye every 1 hour x 3 doses.   Yes [provider]  nitroGLYCERIN  (NITROSTAT ) 0.4 MG SL tablet Place 1 tablet (0.4 mg total) under the tongue every 5 (five) minutes  as needed for chest pain. 03/05/24  Yes Gollan, Timothy J, MD  ondansetron  (ZOFRAN -ODT) 4 MG disintegrating tablet Take 1 tablet (4 mg total) by mouth every 8 (eight) hours as needed for nausea or vomiting. 07/12/24  Yes [name], Katherine K, NP  pantoprazole  (PROTONIX ) 20 MG tablet TAKE 1 TABLET BY MOUTH DAILY FOR HEARTBURN 07/06/24  Yes [name], Katherine K, NP  sertraline  (ZOLOFT ) 100 MG tablet TAKE 1 TABLET BY MOUTH DAILY FOR ANXIETY AND DEPRESSION 07/06/24  Yes Gretta Comer POUR, NP  vitamin B-12 (CYANOCOBALAMIN) 1000 MCG tablet Take 1,000 mcg by mouth daily.   Yes [provider]  Bempedoic Acid -Ezetimibe  (NEXLIZET ) 180-10 MG TABS Take 1 tablet by mouth daily. Pt has scheduled an office visit with provider in April, 2025 Patient not taking: Reported on 07/12/2024 01/02/24   Verlin Lonni BIRCH, MD  Blood Glucose Monitoring Suppl DEVI 1 each by Does not apply route in the morning, at noon, and at bedtime. May substitute to any manufacturer covered by patient's insurance. 02/16/23   [name], Katherine K, NP  Glucose Blood (BLOOD GLUCOSE TEST STRIPS) STRP 1 each by In Vitro route in the morning, at noon, and at bedtime. May substitute to any manufacturer covered by patient's insurance. 02/16/23   Gretta Comer POUR, NP  Lancets Misc. MISC 1 each by Does not apply route in the morning, at noon, and at bedtime. May substitute to any manufacturer covered by  patient's insurance. 02/16/23   [name], Katherine K, NP  ROCKLATAN 0.02-0.005 % SOLN Place 1 drop into the right eye at bedtime. Patient not taking: Reported on 07/12/2024 04/16/23   [provider]  triamcinolone  cream (KENALOG ) 0.1 % Apply 1 Application topically 2 (two) times daily. Patient not taking: Reported on 07/12/2024 08/19/23   Janit Thresa HERO, DPM  loratadine  (CLARITIN ) 10 MG tablet Take 10 mg by mouth as needed.   02/15/12  [provider]    Family History  Problem Relation Age of Onset   Cancer Mother        jaw   Hypertension Father    Heart attack Father    Heart attack Sister    Cancer Sister        unknown primary-mets throughout body   Colon cancer Neg Hx    Esophageal cancer Neg Hx    Pancreatic cancer Neg Hx    Stomach cancer Neg Hx    Liver disease Neg Hx      Social History   Tobacco Use   Smoking status: Never   Smokeless tobacco: Never  Vaping Use   Vaping status: Never Used  Substance Use Topics   Alcohol use: No   Drug use: No    Allergies as of 07/12/2024 - Review Complete 07/12/2024  Allergen Reaction Noted   Other Rash and Anaphylaxis 03/04/2015   Shellfish allergy  Anaphylaxis and Rash 12/10/2011   Cephalexin   01/10/2008   Ciprofloxacin     Clopidogrel bisulfate     Ezetimibe -simvastatin  01/24/2007   Lidocaine  Hives 11/13/2015   Nitrofurantoin     Pregabalin     Celecoxib Rash 02/17/2015   Codeine Rash 02/17/2015   Codeine phosphate Rash 01/24/2007   Hydrocod poli-chlorphe poli er Rash 03/04/2015   Propoxyphene Rash 03/04/2015   Propoxyphene n-acetaminophen  Rash    Rofecoxib Rash    Sulfa antibiotics Rash 02/17/2015   Sulfamethoxazole Rash 01/24/2007   Sulfonamide derivatives Rash 01/24/2007    Review of Systems:    All systems reviewed and negative except where  noted in HPI.   Physical Exam:  Vital signs in last 24 hours: Temp:  [97.8 F (36.6 C)-99.3 F (37.4 C)] 98.2 F (36.8 C) (08/15 1318) Pulse Rate:   [77-116] 93 (08/15 1318) Resp:  [16-30] 16 (08/15 1142) BP: (91-147)/(49-94) 131/84 (08/15 1318) SpO2:  [92 %-100 %] 97 % (08/15 1318) Weight:  [75.8 kg] 75.8 kg (08/14 1626)   General:   Pleasant, cooperative in NAD Head:  Normocephalic and atraumatic. Eyes:   No icterus.   Conjunctiva pink. PERRLA. Ears:  Normal auditory acuity. Neck:  Supple; no masses or thyroidomegaly Lungs: Respirations even and unlabored. Lungs clear to auscultation bilaterally.   No wheezes, crackles, or rhonchi.  Heart:  Regular rate and rhythm;  Without murmur, clicks, rubs or gallops Abdomen:  Soft, nondistended, nontender. Normal bowel sounds. No appreciable masses or hepatomegaly.  No rebound or guarding.  Rectal:  Not performed. Msk:  Symmetrical without gross deformities.   Extremities:  Without edema, cyanosis or clubbing. Neurologic:  Alert and oriented x3;  grossly normal neurologically. Skin:  Intact without significant lesions or rashes. Cervical Nodes:  No significant cervical adenopathy. Psych:  Alert and cooperative. Normal affect.  LAB RESULTS: Recent Labs    07/12/24 1042 07/12/24 1627 07/12/24 2130 07/13/24 0408 07/13/24 0753  WBC 4.1 5.1  --   --  5.2  HGB 9.5* 7.8* 7.7* 7.8* 7.9*  HCT 28.8* 23.9*  --   --  24.9*  PLT 252.0 202  --   --  190   BMET Recent Labs    07/12/24 1042 07/12/24 1627 07/13/24 0408  NA 138 137 141  K 5.6 No hemolysis seen* 4.7 4.2  CL 100 101 108  CO2 31 26 25   GLUCOSE 312* 274* 191*  BUN 45* 60* 61*  CREATININE 1.24* 1.34* 1.28*  CALCIUM 8.9 8.5* 8.1*   LFT Recent Labs    07/12/24 1627  PROT 5.4*  ALBUMIN 3.1*  AST 21  ALT 10  ALKPHOS 19*  BILITOT 1.2   PT/INR Recent Labs    07/12/24 1627  LABPROT 22.4*  INR 1.9*    STUDIES: No results found.    Impression / Plan:   Assessment: Principal Problem:   GI bleed Active Problems:   Coffee ground emesis   [name] [name] is a 89 y.o. y/o female with who appears younger than her  stated age.  She came in with hematemesis and is on Eliquis .  Due to her drop in hemoglobin from 9.5 on admission down to 7.8 overnight the patient will be set up for an EGD although intervention cannot be accomplished due to the patient having her last Eliquis  in the evening.  The patient had anemia that was deferred any workup back in April of last year due to her age but then in November had an EGD and colonoscopy that showed a gastric mass.  The biopsies showed a hyperplastic lesion without any follow-up to make sure that it was not a sampling error.  Plan:  The patient will be set up for an upper endoscopy for this morning.  The patient and her family have explained the plan and agree with it.  The patient and the family have been told about the risks and benefits including the use of anesthesia for the procedure.  They agreed to proceed with the procedure.  PPI IV twice daily  Continue serial CBCs and transfuse PRN Avoid NSAIDs Maintain 2 large-bore IV lines Please page GI with any acute hemodynamic  changes, or signs of active GI bleeding   Thank you for involving me in the care of this patient.      LOS: 1 day   Rogelia Copping, MD, MD. NOLIA 07/13/2024, 1:28 PM,  Pager [phone] 7am-5pm  Check AMION for 5pm -7am coverage and on weekends   Note: This dictation was prepared with Dragon dictation along with smaller phrase technology. Any transcriptional errors that result from this process are unintentional.

## 2024-07-13 NOTE — ED Notes (Signed)
 Patient cleaned up from a large urinary incontinence episode and small BM. New brief placed on patient and clean chux placed. Bed low and locked at this time. Call bell in reach.

## 2024-07-13 NOTE — Transfer of Care (Signed)
 Immediate Anesthesia Transfer of Care Note  Patient: [name] [name]  Procedure(s) Performed: EGD (ESOPHAGOGASTRODUODENOSCOPY)  Patient Location: PACU and Endoscopy Unit  Anesthesia Type:General  Level of Consciousness: drowsy and patient cooperative  Airway & Oxygen Therapy: Patient Spontanous Breathing  Post-op Assessment: Report given to RN and Post -op Vital signs reviewed and stable  Post vital signs: Reviewed and stable  Last Vitals:  Vitals Value Taken Time  BP 91/57 07/13/24 10:54  Temp    Pulse    Resp 16 07/13/24 10:54  SpO2 97 % 07/13/24 10:54    Last Pain:  Vitals:   07/13/24 1030  TempSrc: Temporal  PainSc: 0-No pain         Complications: No notable events documented.

## 2024-07-13 NOTE — ED Notes (Signed)
 Pt has Vigamox eye drops for R eye q 3 hrs. Family member spoke with pharmacy tech last night who said ok to give her the home eyedrops. Burnard, niece and POA is giving to pt right now.

## 2024-07-13 NOTE — Op Note (Signed)
 [facility] Gastroenterology Patient Name: [name] [name] Procedure Date: 07/13/2024 10:07 AM MRN: [id number] Account #: [id number] Date of Birth: [birth year] Admit Type: Outpatient Age: 89 Room: [facility] ENDO ROOM 2 Gender: Female Note Status: Finalized Instrument Name: Barnie GI Scope [id number] [id number] Procedure:             Upper GI endoscopy Indications:           Hematemesis Providers:             Rogelia Copping MD, MD Medicines:             Propofol  per Anesthesia Complications:         No immediate complications. Procedure:             Pre-Anesthesia Assessment:                        - Prior to the procedure, a History and Physical was                         performed, and patient medications and allergies were                         reviewed. The patient's tolerance of previous                         anesthesia was also reviewed. The risks and benefits                         of the procedure and the sedation options and risks                         were discussed with the patient. All questions were                         answered, and informed consent was obtained. Prior                         Anticoagulants: The patient has taken no anticoagulant                         or antiplatelet agents. ASA Grade Assessment: II - A                         patient with mild systemic disease. After reviewing                         the risks and benefits, the patient was deemed in                         satisfactory condition to undergo the procedure.                        After obtaining informed consent, the endoscope was                         passed under direct vision. Throughout the procedure,                         the patient's blood  pressure, pulse, and oxygen                         saturations were monitored continuously. The Endoscope                         was introduced through the mouth, and advanced to the                         second part of duodenum. The  upper GI endoscopy was                         accomplished without difficulty. The patient tolerated                         the procedure well. Findings:      The examined esophagus was normal.      A large, sessile, non-circumferential mass with no bleeding and stigmata       of recent bleeding was found at the incisura. Biopsy is contraindicated       because the patient is taking anticoagulation medication.      Multiple carpet-like polyps were found in the entire examined stomach.      The examined duodenum was normal. Impression:            - Normal esophagus.                        - Gastric tumor at the incisura. Biopsy is                         contraindicated.                        - Multiple gastric polyps.                        - Normal examined duodenum.                        - No specimens collected. Recommendation:        - Return patient to hospital ward for ongoing care.                        - Resume previous diet.                        - Continue present medications.                        - Repeat upper endoscopy in 2 days. Procedure Code(s):     --- Professional ---                        [phone], Esophagogastroduodenoscopy, flexible,                         transoral; diagnostic, including collection of                         specimen(s) by brushing or washing, when performed                         (  separate procedure) Diagnosis Code(s):     --- Professional ---                        K92.0, Hematemesis                        D49.0, Neoplasm of unspecified behavior of digestive                         system CPT copyright 2022 American Medical Association. All rights reserved. The codes documented in this report are preliminary and upon coder review may  be revised to meet current compliance requirements. Rogelia Copping MD, MD 07/13/2024 10:54:14 AM This report has been signed electronically. Number of Addenda: 0 Note Initiated On: 07/13/2024 10:07  AM Estimated Blood Loss:  Estimated blood loss: none.      [facility]

## 2024-07-13 NOTE — ED Notes (Signed)
 GI MD at bedside

## 2024-07-13 NOTE — Anesthesia Preprocedure Evaluation (Signed)
 Anesthesia Evaluation  Patient identified by MRN, date of birth, ID band Patient awake    Reviewed: Allergy  & Precautions, NPO status , Patient's Chart, lab work & pertinent test results  Airway Mallampati: III  TM Distance: >3 FB Neck ROM: full    Dental  (+) Upper Dentures, Lower Dentures   Pulmonary neg pulmonary ROS   Pulmonary exam normal breath sounds clear to auscultation       Cardiovascular Exercise Tolerance: Good hypertension, Pt. on medications + CAD and + Cardiac Stents  negative cardio ROS Normal cardiovascular exam+ dysrhythmias Atrial Fibrillation  Rhythm:Irregular     Neuro/Psych   Anxiety     negative neurological ROS  negative psych ROS   GI/Hepatic negative GI ROS, Neg liver ROS,GERD  Medicated,,  Endo/Other  negative endocrine ROSdiabetes    Renal/GU negative Renal ROS  negative genitourinary   Musculoskeletal   Abdominal  (+) + obese  Peds  Hematology negative hematology ROS (+) Blood dyscrasia, anemia   Anesthesia Other Findings Past Medical History: 08/16/2019: Acute bilateral thoracic back pain 10/10/2023: Acute post-hemorrhagic anemia No date: Allergy      Comment:  Cipro, Plavix, Statins No date: Anemia No date: Arthritis No date: Atrial fibrillation (HCC) No date: CAD (coronary artery disease)     Comment:  a. s/p tandem Promus DES to LAD in 2009 by Dr. Obie;                b.  LHC (03/22/14):  prox LAD 30%, mid LAD 40%, LAD stent               ok with dist 20% ISR, apical LAD occluded with L-L               collats filling apical vessel (too small for PCI), mid               RCA 20%, EF 70%.  Med Rx No date: Cancer [facility]) No date: Chronic kidney disease, stage 3b (HCC) No date: Colon polyps No date: Diabetes mellitus     Comment:  Diagnosed 2012 No date: Diverticulosis of colon (without mention of hemorrhage) 12/2022: DVT (deep venous thrombosis) (HCC)     Comment:  right  leg No date: GERD (gastroesophageal reflux disease) No date: Glaucoma No date: Grade I diastolic dysfunction No date: Grade I diastolic dysfunction 04/24/2020: Herpes zoster without complication No date: Hyperlipidemia     Comment:  intol of statins No date: Hypertension No date: Kidney stones No date: Left atrial dilation No date: Moderate aortic regurgitation No date: Motion sickness     Comment:  back seat cars dx'd 2003: NHL (non-Hodgkin's lymphoma) (HCC)     Comment:  chemo/xrt comp 2003 No date: Personal history of colonic polyps No date: Proctitis 07/18/2014: Pruritic intertrigo No date: Urticaria No date: Wears dentures     Comment:  Full upper, partial lower No date: Wears hearing aid in both ears  Past Surgical History: 10/15/2023: BIOPSY     Comment:  Procedure: BIOPSY;  Surgeon: Toledo, Ladell POUR, MD;                Location: [facility] ENDOSCOPY;  Service: Gastroenterology;; No date: cardiac cath-neg No date: CATARACT EXTRACTION; Bilateral 10/15/2023: COLONOSCOPY WITH PROPOFOL ; N/A     Comment:  Procedure: COLONOSCOPY WITH PROPOFOL ;  Surgeon: Toledo,               Ladell POUR, MD;  Location: [facility] ENDOSCOPY;  Service:  Gastroenterology;  Laterality: N/A; No date: CORONARY ANGIOPLASTY WITH STENT PLACEMENT     Comment:  x 2 No date: dexa-neg 10/15/2023: ESOPHAGOGASTRODUODENOSCOPY (EGD) WITH PROPOFOL ; N/A     Comment:  Procedure: ESOPHAGOGASTRODUODENOSCOPY (EGD) WITH               PROPOFOL ;  Surgeon: Toledo, Ladell POUR, MD;  Location:               [facility] ENDOSCOPY;  Service: Gastroenterology;  Laterality:               N/A; 10/15/2023: HEMOSTASIS CLIP PLACEMENT     Comment:  Procedure: HEMOSTASIS CLIP PLACEMENT;  Surgeon: Aundria,               Ladell POUR, MD;  Location: [facility] ENDOSCOPY;  Service:               Gastroenterology;; 10/30/2003: KNEE SURGERY     Comment:  left 03/22/2014: LEFT HEART CATHETERIZATION WITH CORONARY ANGIOGRAM; N/A     Comment:   Procedure: LEFT HEART CATHETERIZATION WITH CORONARY               ANGIOGRAM;  Surgeon: Lonni JONETTA Cash, MD;                Location: [facility] CATH LAB;  Service: Cardiovascular;                Laterality: N/A; 10/15/2023: POLYPECTOMY     Comment:  Procedure: POLYPECTOMY;  Surgeon: Toledo, Ladell POUR, MD;              Location: [facility] ENDOSCOPY;  Service: Gastroenterology;; No date: stress cardiolite  No date: VAGINAL HYSTERECTOMY     Comment:  partial, fibroids, one ovary left  BMI    Body Mass Index: 24.66 kg/m      Reproductive/Obstetrics negative OB ROS                              Anesthesia Physical Anesthesia Plan  ASA: 3  Anesthesia Plan: General   Post-op Pain Management:    Induction: Intravenous  PONV Risk Score and Plan: Propofol  infusion and TIVA  Airway Management Planned: Natural Airway and Nasal Cannula  Additional Equipment:   Intra-op Plan:   Post-operative Plan:   Informed Consent: I have reviewed the patients History and Physical, chart, labs and discussed the procedure including the risks, benefits and alternatives for the proposed anesthesia with the patient or authorized representative who has indicated his/her understanding and acceptance.     Dental Advisory Given  Plan Discussed with: CRNA  Anesthesia Plan Comments:         Anesthesia Quick Evaluation

## 2024-07-13 NOTE — Progress Notes (Signed)
 [facility] Progress Note  Patient: [name] [name]    FMW:[id number]  DOA: 07/12/2024     Date of Service: the patient was seen and examined on 07/13/2024  Chief Complaint  Patient presents with   GI Problem   Brief hospital course: [name] [name] is a 89 y.o. female with medical history significant of  Persistent Atrial fibrillation on eliquis , hx of DVT 1 year ago, HTN ,CAD, mitral valve prolapse,GERD, IBS-C,DMII, MALT lymphoma,CKDIIIa, IDA who presents to ED with complaints of coffee grounds emesis as well as dark stools that started last pm. Patient followed up with pcp this am and was given antiemetic and told to go to ER if symptoms did not improve.  Due to persistent symptoms patient presented to ED. Patient notes she has not had any further emesis or black stools. She does however noted  continued nausea and epigastric tenderness.     ED Course:  Temp 99, bp 122/60, hr 110, sat 97% on ra  Fob + in pcp office  Hgb 9.5 in pcp office 7.8 in ed Wbc 5.1, plt 202 Inr 1.9  Na 137, 4.7, cl 101, glu 274, cr 1.34 ( at baseline )  EKG: atrial fibrillation  at 129 Tx  pepcid   20 iv , phenegran     Assessment and Plan:  # Gastric tumor Patient presented with acute Upper Gi bleed -hx of prior Gi bleed and NHL, gastric tumor treated >15 years ago.  -Continue ppi bid iv  - Check serial h/h  -transfuse if < 8 in setting of tachycardia and CAD  -supportive gently ivfs  while npo  GI consulted, subsequent EGD: Gastric tumor at the incisura. Biopsy is contraindicated. Multiple gastric polyps. -hold anti-hypertensive medication  CT chest: 1. Solitary pulmonary nodule in the RIGHT lower lobe is new from 2014. Indeterminate finding. FDG PET scan may aid in determining malignant potential. 2. No mediastinal adenopathy CT A/P: 1. Large mass within the lumen of the stomach. 2. No evidence of metastatic adenopathy in the abdomen pelvis. 3. No liver metastasis. 4. No peritoneal metastasis. D/w  oncologist, recommended tissue biopsy via EGD versus EUS      # Anemia of blood loss - s/p 1 unit of prbc  -drop in hgb 2 points  9.5-7.8 -continue to monitor h/h   # Atrial Fib RVR - will treat with ivfs/and PRBC - prn low dose metoprolol  if above interventions not effective  -hold apixaban       # CAD s/p stent -no active issues  -  # Hx of DVT -one year ago  Currently on Eliquis  due to A-fib   # CKDIIIb # Hypertension  -currently normotensive -insetting of gi bleed hold anti -hypertensive medications    # GERD -ppi    # DMII -iss/fs  -hold other medications currently until taking po    # IBS -no active issues    # Hx of MALT  NHL S/p chemo xrt   # CKDIIIb -at baseline     # HLD -resume home regimen once med rec completed and taking po    Body mass index is 24.66 kg/m.  Interventions:  Diet: CLD DVT Prophylaxis: SCD, pharmacological prophylaxis contraindicated due to GI bleeding   Advance goals of care discussion: Full code  Family Communication: family was not present at bedside, at the time of interview.  The pt provided permission to discuss medical plan with the family. Opportunity was given to ask question and all questions were answered satisfactorily.  Disposition:  Pt is from home, admitted with gastric tumor, upper GI bleed, still at risk of bleeding and workup pending, which precludes a safe discharge. Discharge to home, when stable, may need few days to improve.  Subjective: No significant events overnight.  Patient was seen after EGD, tolerated procedure well.  Denied any abdominal pain, no chest pain or palpitations.  No difficulty breathing.  Patient is aware that she needs more workup regarding gastric mass.  Physical Exam: General: NAD, lying comfortably Appear in no distress, affect appropriate Eyes: PERRLA ENT: Oral Mucosa Clear, moist  Neck: no JVD,  Cardiovascular: S1 and S2 Present, no Murmur,  Respiratory: good  respiratory effort, Bilateral Air entry equal and Decreased, no Crackles, no wheezes Abdomen: Bowel Sound present, Soft and no tenderness,  Skin: no rashes Extremities: no Pedal edema, no calf tenderness Neurologic: without any new focal findings Gait not checked due to patient safety concerns  Vitals:   07/13/24 1112 07/13/24 1122 07/13/24 1142 07/13/24 1318  BP: 109/66 109/66 113/61 131/84  Pulse: (!) 104 (!) 102 (!) 116 93  Resp: 16 16 16 16   Temp:    98.2 F (36.8 C)  TempSrc:    Oral  SpO2: 97% 99%  97%  Weight:      Height:        Intake/Output Summary (Last 24 hours) at 07/13/2024 1536 Last data filed at 07/13/2024 1048 Gross per 24 hour  Intake 50 ml  Output --  Net 50 ml   Filed Weights   07/12/24 1626  Weight: 75.8 kg    Data Reviewed: I have personally reviewed and interpreted daily labs, tele strips, imagings as discussed above. I reviewed all nursing notes, pharmacy notes, vitals, pertinent old records I have discussed plan of care as described above with RN and patient/family.  CBC: Recent Labs  Lab 07/12/24 1042 07/12/24 1627 07/12/24 2130 07/13/24 0408 07/13/24 0753 07/13/24 1459  WBC 4.1 5.1  --   --  5.2  --   NEUTROABS 2.8  --   --   --   --   --   HGB 9.5* 7.8* 7.7* 7.8* 7.9* 8.6*  HCT 28.8* 23.9*  --   --  24.9*  --   MCV 96.8 100.0  --   --  96.1  --   PLT 252.0 202  --   --  190  --    Basic Metabolic Panel: Recent Labs  Lab 07/12/24 1042 07/12/24 1627 07/13/24 0408  NA 138 137 141  K 5.6 No hemolysis seen* 4.7 4.2  CL 100 101 108  CO2 31 26 25   GLUCOSE 312* 274* 191*  BUN 45* 60* 61*  CREATININE 1.24* 1.34* 1.28*  CALCIUM 8.9 8.5* 8.1*    Studies: No results found.  Scheduled Meds:  sodium chloride    Intravenous Once   insulin  aspart  0-6 Units Subcutaneous Q4H   pantoprazole  (PROTONIX ) IV  40 mg Intravenous Q12H   sertraline   100 mg Oral Daily   Continuous Infusions:  sodium chloride  75 mL/hr at 07/13/24 0826    promethazine  (PHENERGAN ) injection (IM or IVPB) Stopped (07/13/24 0114)   PRN Meds: albuterol , promethazine  (PHENERGAN ) injection (IM or IVPB)  Time spent: 55 minutes  Author: ELVAN [name]. MD [facility] 07/13/2024 3:36 PM  To reach On-call, see care teams to locate the attending and reach out to them via [email or web address]. If 7PM-7AM, please contact night-coverage If you still have difficulty reaching the attending provider, please page the [facility] (  Director on Call) for [facility] on amion for assistance.

## 2024-07-14 DIAGNOSIS — K920 Hematemesis: Secondary | ICD-10-CM | POA: Diagnosis not present

## 2024-07-14 LAB — GLUCOSE, CAPILLARY
Glucose-Capillary: 127 mg/dL — ABNORMAL HIGH (ref 70–99)
Glucose-Capillary: 152 mg/dL — ABNORMAL HIGH (ref 70–99)
Glucose-Capillary: 131 mg/dL — ABNORMAL HIGH (ref 70–99)
Glucose-Capillary: 133 mg/dL — ABNORMAL HIGH (ref 70–99)
Glucose-Capillary: 141 mg/dL — ABNORMAL HIGH (ref 70–99)

## 2024-07-14 LAB — CBC
HCT: 23.5 % — ABNORMAL LOW (ref 36.0–46.0)
Hemoglobin: 7.6 g/dL — ABNORMAL LOW (ref 12.0–15.0)
MCH: 31.3 pg (ref 26.0–34.0)
MCHC: 32.3 g/dL (ref 30.0–36.0)
MCV: 96.7 fL (ref 80.0–100.0)
Platelets: 183 K/uL (ref 150–400)
RBC: 2.43 MIL/uL — ABNORMAL LOW (ref 3.87–5.11)
RDW: 18.8 % — ABNORMAL HIGH (ref 11.5–15.5)
WBC: 3.9 K/uL — ABNORMAL LOW (ref 4.0–10.5)
nRBC: 0 % (ref 0.0–0.2)

## 2024-07-14 LAB — HEPATIC FUNCTION PANEL
ALT: 11 U/L (ref 0–44)
AST: 25 U/L (ref 15–41)
Albumin: 3 g/dL — ABNORMAL LOW (ref 3.5–5.0)
Alkaline Phosphatase: 21 U/L — ABNORMAL LOW (ref 38–126)
Bilirubin, Direct: 0.1 mg/dL (ref 0.0–0.2)
Indirect Bilirubin: 0.7 mg/dL (ref 0.3–0.9)
Total Bilirubin: 0.8 mg/dL (ref 0.0–1.2)
Total Protein: 5 g/dL — ABNORMAL LOW (ref 6.5–8.1)

## 2024-07-14 LAB — HEMOGLOBIN AND HEMATOCRIT, BLOOD
HCT: 23.9 % — ABNORMAL LOW (ref 36.0–46.0)
Hemoglobin: 7.9 g/dL — ABNORMAL LOW (ref 12.0–15.0)

## 2024-07-14 LAB — BASIC METABOLIC PANEL WITH GFR
Anion gap: 6 (ref 5–15)
BUN: 38 mg/dL — ABNORMAL HIGH (ref 8–23)
CO2: 27 mmol/L (ref 22–32)
Calcium: 8.2 mg/dL — ABNORMAL LOW (ref 8.9–10.3)
Chloride: 104 mmol/L (ref 98–111)
Creatinine, Ser: 1.31 mg/dL — ABNORMAL HIGH (ref 0.44–1.00)
GFR, Estimated: 39 mL/min — ABNORMAL LOW (ref >60)
Glucose, Bld: 129 mg/dL — ABNORMAL HIGH (ref 70–99)
Potassium: 4.1 mmol/L (ref 3.5–5.1)
Sodium: 137 mmol/L (ref 135–145)

## 2024-07-14 LAB — PROTIME-INR
INR: 1.3 — ABNORMAL HIGH (ref 0.8–1.2)
Prothrombin Time: 16.7 s — ABNORMAL HIGH (ref 11.4–15.2)

## 2024-07-14 LAB — PHOSPHORUS: Phosphorus: 3.2 mg/dL (ref 2.5–4.6)

## 2024-07-14 LAB — MAGNESIUM: Magnesium: 1.9 mg/dL (ref 1.7–2.4)

## 2024-07-14 MED ORDER — ACETAMINOPHEN 325 MG PO TABS
650.0000 mg | ORAL_TABLET | Freq: Four times a day (QID) | ORAL | Status: DC | PRN
  Administered 2024-07-14 – 2024-07-15 (×2): 650 mg via ORAL
  Filled 2024-07-14 (×2): qty 2

## 2024-07-14 MED ORDER — SODIUM CHLORIDE 0.9 % IV SOLN: INTRAVENOUS | Status: AC

## 2024-07-14 MED ORDER — OXYCODONE HCL 5 MG PO TABS
5.0000 mg | ORAL_TABLET | Freq: Four times a day (QID) | ORAL | Status: DC | PRN
  Administered 2024-07-16 – 2024-07-17 (×3): 5 mg via ORAL
  Filled 2024-07-14 (×3): qty 1

## 2024-07-14 MED ORDER — DILTIAZEM HCL 30 MG PO TABS
30.0000 mg | ORAL_TABLET | Freq: Four times a day (QID) | ORAL | Status: DC
  Administered 2024-07-14 – 2024-07-18 (×15): 30 mg via ORAL
  Filled 2024-07-14 (×15): qty 1

## 2024-07-14 NOTE — Progress Notes (Signed)
 [facility] Progress Note  Patient: [name] [name]    FMW:[id number]  DOA: 07/12/2024     Date of Service: the patient was seen and examined on 07/14/2024  Chief Complaint  Patient presents with   GI Problem   Brief hospital course: CORINNE GOUCHER is a 89 y.o. female with medical history significant of  Persistent Atrial fibrillation on eliquis , hx of DVT 1 year ago, HTN ,CAD, mitral valve prolapse,GERD, IBS-C,DMII, MALT lymphoma,CKDIIIa, IDA who presents to ED with complaints of coffee grounds emesis as well as dark stools that started last pm. Patient followed up with pcp this am and was given antiemetic and told to go to ER if symptoms did not improve.  Due to persistent symptoms patient presented to ED. Patient notes she has not had any further emesis or black stools. She does however noted  continued nausea and epigastric tenderness.     ED Course:  Temp 99, bp 122/60, hr 110, sat 97% on ra  Fob + in pcp office  Hgb 9.5 in pcp office 7.8 in ed Wbc 5.1, plt 202 Inr 1.9  Na 137, 4.7, cl 101, glu 274, cr 1.34 ( at baseline )  EKG: atrial fibrillation  at 129 Tx  pepcid   20 iv , phenegran     Assessment and Plan:  # Gastric tumor Patient presented with acute Upper Gi bleed -hx of prior Gi bleed and NHL, gastric tumor treated >15 years ago.  -Continue ppi bid iv  - Check serial h/h  -transfuse if < 8 in setting of tachycardia and CAD  -supportive gently ivfs  while npo  GI consulted, subsequent EGD: Gastric tumor at the incisura. Biopsy is contraindicated. Multiple gastric polyps. -hold anti-hypertensive medication  CT chest: 1. Solitary pulmonary nodule in the RIGHT lower lobe is new from 2014. Indeterminate finding. FDG PET scan may aid in determining malignant potential. 2. No mediastinal adenopathy CT A/P: 1. Large mass within the lumen of the stomach. 2. No evidence of metastatic adenopathy in the abdomen pelvis. 3. No liver metastasis. 4. No peritoneal metastasis. D/w  oncologist, recommended tissue biopsy via EGD versus EUS 8/16 as per GI, FLD and keep NPO MN for repeat EGD and biopsy tomorrow a.m.     # Anemia of blood loss - s/p 1 unit of prbc  -drop in hgb 2 points  9.5-7.8 8/16 Hb 7.9 -continue to monitor h/h   # Atrial Fib RVR - will treat with ivfs/and PRBC - prn low dose metoprolol  if above interventions not effective  -hold apixaban       # CAD s/p stent -no active issues  -  # Hx of DVT -one year ago  Currently on Eliquis  due to A-fib   # CKDIIIb # Hypertension  -currently normotensive -insetting of gi bleed hold anti -hypertensive medications    # GERD -ppi    # DMII -iss/fs  -hold other medications currently until taking po    # IBS -no active issues    # Hx of MALT  NHL S/p chemo xrt   # CKDIIIb -at baseline     # HLD -resume home regimen once med rec completed and taking po    Body mass index is 24.66 kg/m.  Interventions:  Diet: CLD DVT Prophylaxis: SCD, pharmacological prophylaxis contraindicated due to GI bleeding   Advance goals of care discussion: Full code  Family Communication: family was not present at bedside, at the time of interview.  The pt provided permission to  discuss medical plan with the family. Opportunity was given to ask question and all questions were answered satisfactorily.   Disposition:  Pt is from home, admitted with gastric tumor, upper GI bleed, still at risk of bleeding and workup pending, which precludes a safe discharge. Discharge to home, when stable, may need few days to improve.  Subjective: No significant events overnight.  Patient was having mild epigastric discomfort, pain 4/10, denied any bleeding. Patient was informed, diet advanced to full liquid and keep n.p.o. after midnight for EGD tomorrow a.m.   Physical Exam: General: NAD, lying comfortably Appear in no distress, affect appropriate Eyes: PERRLA ENT: Oral Mucosa Clear, moist  Neck: no JVD,   Cardiovascular: S1 and S2 Present, no Murmur,  Respiratory: good respiratory effort, Bilateral Air entry equal and Decreased, no Crackles, no wheezes Abdomen: BS +, Soft and mild epigastric tenderness,  Skin: no rashes Extremities: no Pedal edema, no calf tenderness Neurologic: without any new focal findings Gait not checked due to patient safety concerns  Vitals:   07/13/24 2353 07/14/24 0437 07/14/24 0757 07/14/24 1205  BP: 120/75 121/73 130/62 119/63  Pulse: (!) 112 68 72 (!) 11  Resp: 18 18 18 17   Temp: 98.4 F (36.9 C) 98.3 F (36.8 C) 98.2 F (36.8 C) 98.2 F (36.8 C)  TempSrc: Oral Oral Oral Oral  SpO2: 94% 94% 93% 96%  Weight:      Height:        Intake/Output Summary (Last 24 hours) at 07/14/2024 1505 Last data filed at 07/14/2024 1300 Gross per 24 hour  Intake 2007.81 ml  Output --  Net 2007.81 ml   Filed Weights   07/12/24 1626  Weight: 75.8 kg    Data Reviewed: I have personally reviewed and interpreted daily labs, tele strips, imagings as discussed above. I reviewed all nursing notes, pharmacy notes, vitals, pertinent old records I have discussed plan of care as described above with RN and patient/family.  CBC: Recent Labs  Lab 07/12/24 1042 07/12/24 1627 07/12/24 2130 07/13/24 0753 07/13/24 1458 07/13/24 1459 07/14/24 0539 07/14/24 1428  WBC 4.1 5.1  --  5.2  --   --  3.9*  --   NEUTROABS 2.8  --   --   --   --   --   --   --   HGB 9.5* 7.8*   < > 7.9* 8.5* 8.6* 7.6* 7.9*  HCT 28.8* 23.9*  --  24.9* 26.1*  --  23.5* 23.9*  MCV 96.8 100.0  --  96.1  --   --  96.7  --   PLT 252.0 202  --  190  --   --  183  --    < > = values in this interval not displayed.   Basic Metabolic Panel: Recent Labs  Lab 07/12/24 1042 07/12/24 1627 07/13/24 0408 07/13/24 1459 07/14/24 0539  NA 138 137 141  --  137  K 5.6 No hemolysis seen* 4.7 4.2  --  4.1  CL 100 101 108  --  104  CO2 31 26 25   --  27  GLUCOSE 312* 274* 191*  --  129*  BUN 45* 60* 61*   --  38*  CREATININE 1.24* 1.34* 1.28*  --  1.31*  CALCIUM 8.9 8.5* 8.1*  --  8.2*  MG  --   --   --  1.8 1.9  PHOS  --   --   --  2.7 3.2    Studies: CT CHEST  ABDOMEN PELVIS W CONTRAST Result Date: 07/13/2024 CLINICAL DATA:  Metastatic disease evaluation. * Tracking Code: BO *. EXAM: CT CHEST, ABDOMEN, AND PELVIS WITH CONTRAST TECHNIQUE: Multidetector CT imaging of the chest, abdomen and pelvis was performed following the standard protocol during bolus administration of intravenous contrast. RADIATION DOSE REDUCTION: This exam was performed according to the departmental dose-optimization program which includes automated exposure control, adjustment of the mA and/or kV according to patient size and/or use of iterative reconstruction technique. CONTRAST:  80mL OMNIPAQUE  IOHEXOL  300 MG/ML  SOLN COMPARISON:  CT chest 08/09/2013 FINDINGS: CT CHEST FINDINGS Cardiovascular: No significant vascular findings. Normal heart size. No pericardial effusion. Mediastinum/Nodes: No axillary or supraclavicular adenopathy. No mediastinal or hilar adenopathy. No pericardial fluid. Esophagus normal. Lungs/Pleura: 10 mm nodule in the RIGHT lower lobe (image 45/2) is new from 2014 Musculoskeletal: No aggressive osseous lesion. CT ABDOMEN AND PELVIS FINDINGS Hepatobiliary: No focal hepatic lesion. No biliary ductal dilatation. Gallbladder is normal. Common bile duct is normal. Pancreas: Pancreas is normal. No ductal dilatation. No pancreatic inflammation. Spleen: Normal spleen Adrenals/urinary tract: Adrenal glands and kidneys are normal. The ureters and bladder normal. Stomach/Bowel: Large round well-circumscribed mass in the mid stomach measures 6.3 x 6.3 cm (image 63) mass is contained within lumen of the stomach. No enlarged lymph nodes in the gastrohepatic ligament. The duodenum, small-bowel colon normal. Vascular/Lymphatic: Abdominal aorta is normal caliber with atherosclerotic calcification. There is no retroperitoneal or  periportal lymphadenopathy. No pelvic lymphadenopathy. Reproductive: Unremarkable Other: No peritoneal or omental metastasis Musculoskeletal: No aggressive osseous lesion. IMPRESSION: CHEST: 1. Solitary pulmonary nodule in the RIGHT lower lobe is new from 2014. Indeterminate finding. FDG PET scan may aid in determining malignant potential. 2. No mediastinal adenopathy. PELVIS: 1. Large mass within the lumen of the stomach. 2. No evidence of metastatic adenopathy in the abdomen pelvis. 3. No liver metastasis. 4. No peritoneal metastasis. 5.  Aortic Atherosclerosis (ICD10-I70.0). Electronically Signed   By: Jackquline Boxer M.D.   On: 07/13/2024 17:35    Scheduled Meds:  sodium chloride    Intravenous Once   insulin  aspart  0-6 Units Subcutaneous Q4H   pantoprazole  (PROTONIX ) IV  40 mg Intravenous Q12H   sertraline   100 mg Oral Daily   Continuous Infusions:  sodium chloride  75 mL/hr at 07/14/24 1209   iron  sucrose 300 mg (07/13/24 1826)   promethazine  (PHENERGAN ) injection (IM or IVPB) 6.25 mg (07/13/24 1624)   PRN Meds: albuterol , promethazine  (PHENERGAN ) injection (IM or IVPB)  Time spent: 55 minutes  Author: ELVAN SOR. MD [facility] 07/14/2024 3:05 PM  To reach On-call, see care teams to locate the attending and reach out to them via [email or web address]. If 7PM-7AM, please contact night-coverage If you still have difficulty reaching the attending provider, please page the [facility] (Director on Call) for [facility] on amion for assistance.

## 2024-07-14 NOTE — Progress Notes (Signed)
 Inpatient Follow-up/Progress Note   Patient ID: [name] [name] is a 89 y.o. female.  Overnight Events / Subjective Findings Patient's hemoglobin down 1 g since yesterday.  She underwent EGD yesterday with Dr. Tommye demonstrating large hyperplastic polyp and gastric mass. Still noticing dark stools.  Mild abdominal tenderness in the epigastric region. CT performed yesterday did not demonstrate any metastatic disease.  She was evaluated by oncology yesterday.  Tolerating p.o. without early satiety. No other acute GI complaints Multiple family members in the room during time of evaluation  Review of Systems  Constitutional:  Negative for activity change, appetite change, chills, diaphoresis, fatigue, fever and unexpected weight change.  HENT:  Negative for trouble swallowing and voice change.   Respiratory:  Negative for shortness of breath and wheezing.   Cardiovascular:  Negative for chest pain, palpitations and leg swelling.  Gastrointestinal:  Positive for abdominal pain and blood in stool (melena). Negative for abdominal distention, anal bleeding, constipation, diarrhea, nausea, rectal pain and vomiting.  Musculoskeletal:  Negative for arthralgias and myalgias.  Skin:  Negative for color change and pallor.  Neurological:  Negative for dizziness, syncope and weakness.  Psychiatric/Behavioral:  Negative for confusion.   All other systems reviewed and are negative.    Medications  Current Facility-Administered Medications:    0.9 %  sodium chloride  infusion (Manually program via Guardrails IV Fluids), , Intravenous, Once, Debby Camila LABOR, MD, Held at 07/12/24 2108   0.9 %  sodium chloride  infusion, , Intravenous, Continuous, Von Bellis, MD, Last Rate: 75 mL/hr at 07/13/24 2109, New Bag at 07/13/24 2109   albuterol  (PROVENTIL ) (2.5 MG/3ML) 0.083% nebulizer solution 2.5 mg, 2.5 mg, Nebulization, Q2H PRN, Debby Camila LABOR, MD   insulin  aspart (novoLOG ) injection 0-6 Units, 0-6  Units, Subcutaneous, Q4H, Debby Camila A, MD, 1 Units at 07/13/24 2103   iron  sucrose (VENOFER ) 300 mg in sodium chloride  0.9 % 250 mL IVPB, 300 mg, Intravenous, Q24H, Brahmanday, Govinda R, MD, Last Rate: 265 mL/hr at 07/13/24 1826, 300 mg at 07/13/24 1826   pantoprazole  (PROTONIX ) injection 40 mg, 40 mg, Intravenous, Q12H, Debby Camila A, MD, 40 mg at 07/13/24 2104   promethazine  (PHENERGAN ) 6.25 mg/NS 50 mL IVPB, 6.25 mg, Intravenous, Q6H PRN, Bradler, Evan K, MD, Last Rate: 150 mL/hr at 07/13/24 1624, 6.25 mg at 07/13/24 1624   sertraline  (ZOLOFT ) tablet 100 mg, 100 mg, Oral, Daily, Debby Camila A, MD, 100 mg at 07/13/24 1014  sodium chloride  75 mL/hr at 07/13/24 2109   iron  sucrose 300 mg (07/13/24 1826)   promethazine  (PHENERGAN ) injection (IM or IVPB) 6.25 mg (07/13/24 1624)    albuterol , promethazine  (PHENERGAN ) injection (IM or IVPB)   Objective    Vitals:   07/13/24 1318 07/13/24 2038 07/13/24 2353 07/14/24 0437  BP: 131/84 (!) 138/101 120/75 121/73  Pulse: 93 61 (!) 112 68  Resp: 16 17 18 18   Temp: 98.2 F (36.8 C) (!) 97.5 F (36.4 C) 98.4 F (36.9 C) 98.3 F (36.8 C)  TempSrc: Oral Oral Oral Oral  SpO2: 97% 95% 94% 94%  Weight:      Height:         Physical Exam Vitals and nursing note reviewed.  Constitutional:      General: She is not in acute distress.    Appearance: Normal appearance. She is not ill-appearing, toxic-appearing or diaphoretic.  HENT:     Head: Normocephalic and atraumatic.     Nose: Nose normal.     Mouth/Throat:  Mouth: Mucous membranes are moist.     Pharynx: Oropharynx is clear.  Eyes:     General: No scleral icterus.    Extraocular Movements: Extraocular movements intact.  Cardiovascular:     Rate and Rhythm: Rhythm irregular.     Heart sounds: Murmur heard.  Pulmonary:     Effort: Pulmonary effort is normal. No respiratory distress.     Breath sounds: Normal breath sounds. No wheezing, rhonchi or rales.   Abdominal:     General: Bowel sounds are normal. There is no distension.     Palpations: Abdomen is soft.     Tenderness: There is abdominal tenderness (mild- epigastric region). There is no guarding or rebound.  Musculoskeletal:     Cervical back: Neck supple.     Right lower leg: No edema.     Left lower leg: No edema.  Skin:    General: Skin is warm and dry.     Coloration: Skin is not jaundiced or pale.  Neurological:     General: No focal deficit present.     Mental Status: She is alert and oriented to person, place, and time. Mental status is at baseline.  Psychiatric:        Mood and Affect: Mood normal.        Behavior: Behavior normal.        Thought Content: Thought content normal.        Judgment: Judgment normal.      Laboratory Data Recent Labs  Lab 07/12/24 1042 07/12/24 1627 07/12/24 2130 07/13/24 0753 07/13/24 1458 07/13/24 1459 07/14/24 0539  WBC 4.1 5.1  --  5.2  --   --  3.9*  HGB 9.5* 7.8*   < > 7.9* 8.5* 8.6* 7.6*  HCT 28.8* 23.9*  --  24.9* 26.1*  --  23.5*  PLT 252.0 202  --  190  --   --  183  NEUTOPHILPCT 68.9  --   --   --   --   --   --   LYMPHOPCT 24.3  --   --   --   --   --   --   MONOPCT 6.2  --   --   --   --   --   --   EOSPCT 0.2  --   --   --   --   --   --    < > = values in this interval not displayed.   Recent Labs  Lab 07/12/24 1627 07/13/24 0408 07/14/24 0539  NA 137 141 137  K 4.7 4.2 4.1  CL 101 108 104  CO2 26 25 27   BUN 60* 61* 38*  CREATININE 1.34* 1.28* 1.31*  CALCIUM 8.5* 8.1* 8.2*  PROT 5.4*  --  5.0*  BILITOT 1.2  --  0.8  ALKPHOS 19*  --  21*  ALT 10  --  11  AST 21  --  25  GLUCOSE 274* 191* 129*   Recent Labs  Lab 07/12/24 1627 07/14/24 0539  INR 1.9* 1.3*      Imaging Studies: CT CHEST ABDOMEN PELVIS W CONTRAST Result Date: 07/13/2024 CLINICAL DATA:  Metastatic disease evaluation. * Tracking Code: BO *. EXAM: CT CHEST, ABDOMEN, AND PELVIS WITH CONTRAST TECHNIQUE: Multidetector CT imaging of  the chest, abdomen and pelvis was performed following the standard protocol during bolus administration of intravenous contrast. RADIATION DOSE REDUCTION: This exam was performed according to the departmental dose-optimization program which includes automated exposure control,  adjustment of the mA and/or kV according to patient size and/or use of iterative reconstruction technique. CONTRAST:  80mL OMNIPAQUE  IOHEXOL  300 MG/ML  SOLN COMPARISON:  CT chest 08/09/2013 FINDINGS: CT CHEST FINDINGS Cardiovascular: No significant vascular findings. Normal heart size. No pericardial effusion. Mediastinum/Nodes: No axillary or supraclavicular adenopathy. No mediastinal or hilar adenopathy. No pericardial fluid. Esophagus normal. Lungs/Pleura: 10 mm nodule in the RIGHT lower lobe (image 45/2) is new from 2014 Musculoskeletal: No aggressive osseous lesion. CT ABDOMEN AND PELVIS FINDINGS Hepatobiliary: No focal hepatic lesion. No biliary ductal dilatation. Gallbladder is normal. Common bile duct is normal. Pancreas: Pancreas is normal. No ductal dilatation. No pancreatic inflammation. Spleen: Normal spleen Adrenals/urinary tract: Adrenal glands and kidneys are normal. The ureters and bladder normal. Stomach/Bowel: Large round well-circumscribed mass in the mid stomach measures 6.3 x 6.3 cm (image 63) mass is contained within lumen of the stomach. No enlarged lymph nodes in the gastrohepatic ligament. The duodenum, small-bowel colon normal. Vascular/Lymphatic: Abdominal aorta is normal caliber with atherosclerotic calcification. There is no retroperitoneal or periportal lymphadenopathy. No pelvic lymphadenopathy. Reproductive: Unremarkable Other: No peritoneal or omental metastasis Musculoskeletal: No aggressive osseous lesion. IMPRESSION: CHEST: 1. Solitary pulmonary nodule in the RIGHT lower lobe is new from 2014. Indeterminate finding. FDG PET scan may aid in determining malignant potential. 2. No mediastinal adenopathy.  PELVIS: 1. Large mass within the lumen of the stomach. 2. No evidence of metastatic adenopathy in the abdomen pelvis. 3. No liver metastasis. 4. No peritoneal metastasis. 5.  Aortic Atherosclerosis (ICD10-I70.0). Electronically Signed   By: Jackquline Boxer M.D.   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 Hospitalized in 09/2023 with anemia and weakness (similar to this presentation). She did not follow up with her primary gastroenterologist in review of records  Can have full liquids now NPO at midnight Labs in am- cbc, bmp, inr  Protonix  40 mg iv q12 h Hold dvt ppx  Monitor H&H.  Transfusion and resuscitation as per primary team Avoid frequent lab draws to prevent lab induced anemia Supportive care and antiemetics as per primary team Maintain two sites IV access Avoid nsaids Monitor for GIB.  Esophagogastroduodenoscopy with possible biopsy, control of bleeding, polypectomy, and interventions as necessary has been discussed with the patient/patient representative. Informed consent was obtained from the patient/patient representative after explaining the indication, nature, and risks of the procedure including  but not limited to death, bleeding, perforation, missed neoplasm/lesions, cardiorespiratory compromise, and reaction to medications. Opportunity for questions was given and appropriate answers were provided. Patient/patient representative has verbalized understanding is amenable to undergoing the procedure.  Management of other medical comorbidities as per primary team  I personally performed the service.  Thank you for allowing us  to participate  in this patient's care. Please don't hesitate to call if any questions or concerns arise.   Elspeth Ozell Jungling, DO [facility]  Portions of the record may have been created with voice recognition software. Occasional wrong-word or 'sound-a-like' substitutions may have occurred due to the inherent limitations of voice recognition software.  Read the chart carefully and recognize, using context, where substitutions may have occurred.

## 2024-07-14 NOTE — H&P (View-Only) (Signed)
 Inpatient Follow-up/Progress Note   Patient ID: [name] [name] is a 89 y.o. female.  Overnight Events / Subjective Findings Patient's hemoglobin down 1 g since yesterday.  She underwent EGD yesterday with Dr. Tommye demonstrating large hyperplastic polyp and gastric mass. Still noticing dark stools.  Mild abdominal tenderness in the epigastric region. CT performed yesterday did not demonstrate any metastatic disease.  She was evaluated by oncology yesterday.  Tolerating p.o. without early satiety. No other acute GI complaints Multiple family members in the room during time of evaluation  Review of Systems  Constitutional:  Negative for activity change, appetite change, chills, diaphoresis, fatigue, fever and unexpected weight change.  HENT:  Negative for trouble swallowing and voice change.   Respiratory:  Negative for shortness of breath and wheezing.   Cardiovascular:  Negative for chest pain, palpitations and leg swelling.  Gastrointestinal:  Positive for abdominal pain and blood in stool (melena). Negative for abdominal distention, anal bleeding, constipation, diarrhea, nausea, rectal pain and vomiting.  Musculoskeletal:  Negative for arthralgias and myalgias.  Skin:  Negative for color change and pallor.  Neurological:  Negative for dizziness, syncope and weakness.  Psychiatric/Behavioral:  Negative for confusion.   All other systems reviewed and are negative.    Medications  Current Facility-Administered Medications:    0.9 %  sodium chloride  infusion (Manually program via Guardrails IV Fluids), , Intravenous, Once, Debby Camila LABOR, MD, Held at 07/12/24 2108   0.9 %  sodium chloride  infusion, , Intravenous, Continuous, Von Bellis, MD, Last Rate: 75 mL/hr at 07/13/24 2109, New Bag at 07/13/24 2109   albuterol  (PROVENTIL ) (2.5 MG/3ML) 0.083% nebulizer solution 2.5 mg, 2.5 mg, Nebulization, Q2H PRN, Debby Camila LABOR, MD   insulin  aspart (novoLOG ) injection 0-6 Units, 0-6  Units, Subcutaneous, Q4H, Debby Camila A, MD, 1 Units at 07/13/24 2103   iron  sucrose (VENOFER ) 300 mg in sodium chloride  0.9 % 250 mL IVPB, 300 mg, Intravenous, Q24H, Brahmanday, Govinda R, MD, Last Rate: 265 mL/hr at 07/13/24 1826, 300 mg at 07/13/24 1826   pantoprazole  (PROTONIX ) injection 40 mg, 40 mg, Intravenous, Q12H, Debby Camila A, MD, 40 mg at 07/13/24 2104   promethazine  (PHENERGAN ) 6.25 mg/NS 50 mL IVPB, 6.25 mg, Intravenous, Q6H PRN, Bradler, Evan K, MD, Last Rate: 150 mL/hr at 07/13/24 1624, 6.25 mg at 07/13/24 1624   sertraline  (ZOLOFT ) tablet 100 mg, 100 mg, Oral, Daily, Debby Camila A, MD, 100 mg at 07/13/24 1014  sodium chloride  75 mL/hr at 07/13/24 2109   iron  sucrose 300 mg (07/13/24 1826)   promethazine  (PHENERGAN ) injection (IM or IVPB) 6.25 mg (07/13/24 1624)    albuterol , promethazine  (PHENERGAN ) injection (IM or IVPB)   Objective    Vitals:   07/13/24 1318 07/13/24 2038 07/13/24 2353 07/14/24 0437  BP: 131/84 (!) 138/101 120/75 121/73  Pulse: 93 61 (!) 112 68  Resp: 16 17 18 18   Temp: 98.2 F (36.8 C) (!) 97.5 F (36.4 C) 98.4 F (36.9 C) 98.3 F (36.8 C)  TempSrc: Oral Oral Oral Oral  SpO2: 97% 95% 94% 94%  Weight:      Height:         Physical Exam Vitals and nursing note reviewed.  Constitutional:      General: She is not in acute distress.    Appearance: Normal appearance. She is not ill-appearing, toxic-appearing or diaphoretic.  HENT:     Head: Normocephalic and atraumatic.     Nose: Nose normal.     Mouth/Throat:  Mouth: Mucous membranes are moist.     Pharynx: Oropharynx is clear.  Eyes:     General: No scleral icterus.    Extraocular Movements: Extraocular movements intact.  Cardiovascular:     Rate and Rhythm: Rhythm irregular.     Heart sounds: Murmur heard.  Pulmonary:     Effort: Pulmonary effort is normal. No respiratory distress.     Breath sounds: Normal breath sounds. No wheezing, rhonchi or rales.   Abdominal:     General: Bowel sounds are normal. There is no distension.     Palpations: Abdomen is soft.     Tenderness: There is abdominal tenderness (mild- epigastric region). There is no guarding or rebound.  Musculoskeletal:     Cervical back: Neck supple.     Right lower leg: No edema.     Left lower leg: No edema.  Skin:    General: Skin is warm and dry.     Coloration: Skin is not jaundiced or pale.  Neurological:     General: No focal deficit present.     Mental Status: She is alert and oriented to person, place, and time. Mental status is at baseline.  Psychiatric:        Mood and Affect: Mood normal.        Behavior: Behavior normal.        Thought Content: Thought content normal.        Judgment: Judgment normal.      Laboratory Data Recent Labs  Lab 07/12/24 1042 07/12/24 1627 07/12/24 2130 07/13/24 0753 07/13/24 1458 07/13/24 1459 07/14/24 0539  WBC 4.1 5.1  --  5.2  --   --  3.9*  HGB 9.5* 7.8*   < > 7.9* 8.5* 8.6* 7.6*  HCT 28.8* 23.9*  --  24.9* 26.1*  --  23.5*  PLT 252.0 202  --  190  --   --  183  NEUTOPHILPCT 68.9  --   --   --   --   --   --   LYMPHOPCT 24.3  --   --   --   --   --   --   MONOPCT 6.2  --   --   --   --   --   --   EOSPCT 0.2  --   --   --   --   --   --    < > = values in this interval not displayed.   Recent Labs  Lab 07/12/24 1627 07/13/24 0408 07/14/24 0539  NA 137 141 137  K 4.7 4.2 4.1  CL 101 108 104  CO2 26 25 27   BUN 60* 61* 38*  CREATININE 1.34* 1.28* 1.31*  CALCIUM 8.5* 8.1* 8.2*  PROT 5.4*  --  5.0*  BILITOT 1.2  --  0.8  ALKPHOS 19*  --  21*  ALT 10  --  11  AST 21  --  25  GLUCOSE 274* 191* 129*   Recent Labs  Lab 07/12/24 1627 07/14/24 0539  INR 1.9* 1.3*      Imaging Studies: CT CHEST ABDOMEN PELVIS W CONTRAST Result Date: 07/13/2024 CLINICAL DATA:  Metastatic disease evaluation. * Tracking Code: BO *. EXAM: CT CHEST, ABDOMEN, AND PELVIS WITH CONTRAST TECHNIQUE: Multidetector CT imaging of  the chest, abdomen and pelvis was performed following the standard protocol during bolus administration of intravenous contrast. RADIATION DOSE REDUCTION: This exam was performed according to the departmental dose-optimization program which includes automated exposure control,  adjustment of the mA and/or kV according to patient size and/or use of iterative reconstruction technique. CONTRAST:  80mL OMNIPAQUE  IOHEXOL  300 MG/ML  SOLN COMPARISON:  CT chest 08/09/2013 FINDINGS: CT CHEST FINDINGS Cardiovascular: No significant vascular findings. Normal heart size. No pericardial effusion. Mediastinum/Nodes: No axillary or supraclavicular adenopathy. No mediastinal or hilar adenopathy. No pericardial fluid. Esophagus normal. Lungs/Pleura: 10 mm nodule in the RIGHT lower lobe (image 45/2) is new from 2014 Musculoskeletal: No aggressive osseous lesion. CT ABDOMEN AND PELVIS FINDINGS Hepatobiliary: No focal hepatic lesion. No biliary ductal dilatation. Gallbladder is normal. Common bile duct is normal. Pancreas: Pancreas is normal. No ductal dilatation. No pancreatic inflammation. Spleen: Normal spleen Adrenals/urinary tract: Adrenal glands and kidneys are normal. The ureters and bladder normal. Stomach/Bowel: Large round well-circumscribed mass in the mid stomach measures 6.3 x 6.3 cm (image 63) mass is contained within lumen of the stomach. No enlarged lymph nodes in the gastrohepatic ligament. The duodenum, small-bowel colon normal. Vascular/Lymphatic: Abdominal aorta is normal caliber with atherosclerotic calcification. There is no retroperitoneal or periportal lymphadenopathy. No pelvic lymphadenopathy. Reproductive: Unremarkable Other: No peritoneal or omental metastasis Musculoskeletal: No aggressive osseous lesion. IMPRESSION: CHEST: 1. Solitary pulmonary nodule in the RIGHT lower lobe is new from 2014. Indeterminate finding. FDG PET scan may aid in determining malignant potential. 2. No mediastinal adenopathy.  PELVIS: 1. Large mass within the lumen of the stomach. 2. No evidence of metastatic adenopathy in the abdomen pelvis. 3. No liver metastasis. 4. No peritoneal metastasis. 5.  Aortic Atherosclerosis (ICD10-I70.0). Electronically Signed   By: Jackquline Boxer M.D.   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 Hospitalized in 09/2023 with anemia and weakness (similar to this presentation). She did not follow up with her primary gastroenterologist in review of records  Can have full liquids now NPO at midnight Labs in am- cbc, bmp, inr  Protonix  40 mg iv q12 h Hold dvt ppx  Monitor H&H.  Transfusion and resuscitation as per primary team Avoid frequent lab draws to prevent lab induced anemia Supportive care and antiemetics as per primary team Maintain two sites IV access Avoid nsaids Monitor for GIB.  Esophagogastroduodenoscopy with possible biopsy, control of bleeding, polypectomy, and interventions as necessary has been discussed with the patient/patient representative. Informed consent was obtained from the patient/patient representative after explaining the indication, nature, and risks of the procedure including  but not limited to death, bleeding, perforation, missed neoplasm/lesions, cardiorespiratory compromise, and reaction to medications. Opportunity for questions was given and appropriate answers were provided. Patient/patient representative has verbalized understanding is amenable to undergoing the procedure.  Management of other medical comorbidities as per primary team  I personally performed the service.  Thank you for allowing us  to participate  in this patient's care. Please don't hesitate to call if any questions or concerns arise.   Elspeth Ozell Jungling, DO [facility]  Portions of the record may have been created with voice recognition software. Occasional wrong-word or 'sound-a-like' substitutions may have occurred due to the inherent limitations of voice recognition software.  Read the chart carefully and recognize, using context, where substitutions may have occurred.

## 2024-07-14 NOTE — Plan of Care (Signed)
  Problem: Coping: Goal: Ability to adjust to condition or change in health will improve Outcome: Progressing   Problem: Fluid Volume: Goal: Ability to maintain a balanced intake and output will improve Outcome: Progressing   Problem: Health Behavior/Discharge Planning: Goal: Ability to identify and utilize available resources and services will improve Outcome: Progressing   Problem: Skin Integrity: Goal: Risk for impaired skin integrity will decrease Outcome: Progressing   Problem: Tissue Perfusion: Goal: Adequacy of tissue perfusion will improve Outcome: Progressing

## 2024-07-15 ENCOUNTER — Inpatient Hospital Stay: Admitting: Anesthesiology

## 2024-07-15 ENCOUNTER — Encounter
Admission: EM | Disposition: A | Discharge status: Home / Self Care | Payer: Self-pay | Source: Home / Self Care | Attending: Student

## 2024-07-15 ENCOUNTER — Encounter: Payer: Self-pay | Admitting: Internal Medicine

## 2024-07-15 DIAGNOSIS — K920 Hematemesis: Secondary | ICD-10-CM | POA: Diagnosis not present

## 2024-07-15 HISTORY — PX: ESOPHAGOGASTRODUODENOSCOPY: SHX5428

## 2024-07-15 LAB — GLUCOSE, CAPILLARY
Glucose-Capillary: 125 mg/dL — ABNORMAL HIGH (ref 70–99)
Glucose-Capillary: 130 mg/dL — ABNORMAL HIGH (ref 70–99)
Glucose-Capillary: 133 mg/dL — ABNORMAL HIGH (ref 70–99)
Glucose-Capillary: 137 mg/dL — ABNORMAL HIGH (ref 70–99)
Glucose-Capillary: 137 mg/dL — ABNORMAL HIGH (ref 70–99)
Glucose-Capillary: 137 mg/dL — ABNORMAL HIGH (ref 70–99)
Glucose-Capillary: 155 mg/dL — ABNORMAL HIGH (ref 70–99)

## 2024-07-15 LAB — BASIC METABOLIC PANEL WITH GFR
Anion gap: 7 (ref 5–15)
BUN: 22 mg/dL (ref 8–23)
CO2: 23 mmol/L (ref 22–32)
Calcium: 8.2 mg/dL — ABNORMAL LOW (ref 8.9–10.3)
Chloride: 109 mmol/L (ref 98–111)
Creatinine, Ser: 1.16 mg/dL — ABNORMAL HIGH (ref 0.44–1.00)
GFR, Estimated: 45 mL/min — ABNORMAL LOW (ref >60)
Glucose, Bld: 144 mg/dL — ABNORMAL HIGH (ref 70–99)
Potassium: 3.8 mmol/L (ref 3.5–5.1)
Sodium: 139 mmol/L (ref 135–145)

## 2024-07-15 LAB — HEPATIC FUNCTION PANEL
ALT: 11 U/L (ref 0–44)
AST: 24 U/L (ref 15–41)
Albumin: 3 g/dL — ABNORMAL LOW (ref 3.5–5.0)
Alkaline Phosphatase: 23 U/L — ABNORMAL LOW (ref 38–126)
Bilirubin, Direct: 0.2 mg/dL (ref 0.0–0.2)
Indirect Bilirubin: 0.6 mg/dL (ref 0.3–0.9)
Total Bilirubin: 0.8 mg/dL (ref 0.0–1.2)
Total Protein: 4.9 g/dL — ABNORMAL LOW (ref 6.5–8.1)

## 2024-07-15 LAB — CBC
HCT: 22.8 % — ABNORMAL LOW (ref 36.0–46.0)
Hemoglobin: 7.3 g/dL — ABNORMAL LOW (ref 12.0–15.0)
MCH: 31.7 pg (ref 26.0–34.0)
MCHC: 32 g/dL (ref 30.0–36.0)
MCV: 99.1 fL (ref 80.0–100.0)
Platelets: 180 K/uL (ref 150–400)
RBC: 2.3 MIL/uL — ABNORMAL LOW (ref 3.87–5.11)
RDW: 19 % — ABNORMAL HIGH (ref 11.5–15.5)
WBC: 3.1 K/uL — ABNORMAL LOW (ref 4.0–10.5)
nRBC: 0.6 % — ABNORMAL HIGH (ref 0.0–0.2)

## 2024-07-15 LAB — MAGNESIUM: Magnesium: 1.8 mg/dL (ref 1.7–2.4)

## 2024-07-15 LAB — PROTIME-INR
INR: 1.2 (ref 0.8–1.2)
Prothrombin Time: 15.8 s — ABNORMAL HIGH (ref 11.4–15.2)

## 2024-07-15 LAB — HEMOGLOBIN AND HEMATOCRIT, BLOOD
HCT: 21 % — ABNORMAL LOW (ref 36.0–46.0)
Hemoglobin: 6.8 g/dL — ABNORMAL LOW (ref 12.0–15.0)

## 2024-07-15 LAB — PHOSPHORUS: Phosphorus: 3.4 mg/dL (ref 2.5–4.6)

## 2024-07-15 LAB — PREPARE RBC (CROSSMATCH)

## 2024-07-15 SURGERY — EGD (ESOPHAGOGASTRODUODENOSCOPY)
Anesthesia: General

## 2024-07-15 MED ORDER — LIDOCAINE HCL (CARDIAC) PF 100 MG/5ML IV SOSY
PREFILLED_SYRINGE | INTRAVENOUS | Status: DC | PRN
  Administered 2024-07-15: 50 mg via INTRAVENOUS

## 2024-07-15 MED ORDER — EPINEPHRINE 1 MG/10ML IJ SOSY
PREFILLED_SYRINGE | INTRAMUSCULAR | Status: DC | PRN
  Administered 2024-07-15: 303.2 mg

## 2024-07-15 MED ORDER — SODIUM CHLORIDE 0.9% IV SOLUTION: Freq: Once | INTRAVENOUS | Status: AC

## 2024-07-15 MED ORDER — SODIUM CHLORIDE 0.9 % IV SOLN: INTRAVENOUS | Status: DC

## 2024-07-15 MED ORDER — PROPOFOL 1000 MG/100ML IV EMUL
INTRAVENOUS | Status: AC
  Filled 2024-07-15: qty 100

## 2024-07-15 MED ORDER — PROPOFOL 10 MG/ML IV BOLUS
INTRAVENOUS | Status: DC | PRN
  Administered 2024-07-15: 20 mg via INTRAVENOUS
  Administered 2024-07-15: 125 ug/kg/min via INTRAVENOUS
  Administered 2024-07-15: 60 mg via INTRAVENOUS

## 2024-07-15 MED ORDER — LIDOCAINE HCL (PF) 2 % IJ SOLN
INTRAMUSCULAR | Status: AC
  Filled 2024-07-15: qty 5

## 2024-07-15 NOTE — Anesthesia Postprocedure Evaluation (Signed)
 Anesthesia Post Note  Patient: Ronal KATHEE Lango  Procedure(s) Performed: EGD (ESOPHAGOGASTRODUODENOSCOPY)  Patient location during evaluation: PACU Anesthesia Type: General Level of consciousness: awake and alert, oriented and patient cooperative Pain management: pain level controlled Vital Signs Assessment: post-procedure vital signs reviewed and stable Respiratory status: spontaneous breathing, nonlabored ventilation and respiratory function stable Cardiovascular status: blood pressure returned to baseline and stable Postop Assessment: adequate PO intake Anesthetic complications: no   No notable events documented.   Last Vitals:  Vitals:   07/15/24 0915 07/15/24 0928  BP: (!) 113/57 (!) 125/59  Pulse: (!) 111   Resp: 19   Temp:  36.9 C  SpO2: 99%     Last Pain:  Vitals:   07/15/24 0928  TempSrc:   PainSc: 0-No pain                 Alfonso Ruths

## 2024-07-15 NOTE — Interval H&P Note (Signed)
 History and Physical Interval Note:  Progress note from 07/14/24 was reviewed. Hgb slightly down from yesterday. She is currently tachycardic with known afib (hr 110-120). She denies chest pain, shortness of breath and palpitations. There was no other interval change after seeing and examining the patient.  Written consent was obtained from the patient after discussion of risks, benefits, and alternatives. Patient has consented to proceed with Esophagogastroduodenoscopy with possible intervention. Daughter is in lobby pre-procedure with no further questions at this time.   07/15/2024 8:02 AM  [name] [name]  has presented today for surgery, with the diagnosis of gastric mass.  The various methods of treatment have been discussed with the patient and family. After consideration of risks, benefits and other options for treatment, the patient has consented to  Procedure(s): EGD (ESOPHAGOGASTRODUODENOSCOPY) (N/A) as a surgical intervention.  The patient's history has been reviewed, patient examined, no change in status, stable for surgery.  I have reviewed the patient's chart and labs.  Questions were answered to the patient's satisfaction.     Elspeth Ozell Jungling

## 2024-07-15 NOTE — Plan of Care (Signed)
   Problem: Coping: Goal: Ability to adjust to condition or change in health will improve Outcome: Progressing

## 2024-07-15 NOTE — Care Plan (Addendum)
 Brief GI Post op note Please see endoscopy report for further details Patient found to have ulcer with visible vessel treated with epinephrine , gold probe cautery and PuraStat with hemostasis Larger hyperplastic polyp was not removed due to its vicinity to the ulcer with visible vessel. Gastric mass again appreciated Other biopsies were taken  Continue to monitor H&H.  Transfusion as necessary per primary team Continue IV PPI twice daily.  Patient to be sent out on p.o. PPI for at least 8 weeks Patient will need endoscopic ultrasound as outpatient Await pathology results for further recommendations Follow-up with primary gastroenterologist in [facility]  Full liquid diet to resume after 4 hours post procedure Avoid NSAIDs Restart Eliquis  in 3 days  This was discussed with the patient's daughter in the lobby postoperatively and with the primary team  Elspeth EMERSON Jungling, DO [facility]

## 2024-07-15 NOTE — Anesthesia Procedure Notes (Signed)
 Date/Time: 07/15/2024 8:21 AM  Performed by: Dominica Krabbe, CRNAPre-anesthesia Checklist: Patient identified, Emergency Drugs available, Suction available, Patient being monitored and Timeout performed Patient Re-evaluated:Patient Re-evaluated prior to induction Oxygen Delivery Method: Nasal cannula Preoxygenation: Pre-oxygenation with 100% oxygen Induction Type: IV induction

## 2024-07-15 NOTE — Progress Notes (Signed)
 [facility] Progress Note  Patient: [name] [name]    FMW:[id number]  DOA: 07/12/2024     Date of Service: the patient was seen and examined on 07/15/2024  Chief Complaint  Patient presents with   GI Problem   Brief hospital course: [name] [name] is a 89 y.o. female with medical history significant of  Persistent Atrial fibrillation on eliquis , hx of DVT 1 year ago, HTN ,CAD, mitral valve prolapse,GERD, IBS-C,DMII, MALT lymphoma,CKDIIIa, IDA who presents to ED with complaints of coffee grounds emesis as well as dark stools that started last pm. Patient followed up with pcp this am and was given antiemetic and told to go to ER if symptoms did not improve.  Due to persistent symptoms patient presented to ED. Patient notes she has not had any further emesis or black stools. She does however noted  continued nausea and epigastric tenderness.     ED Course:  Temp 99, bp 122/60, hr 110, sat 97% on ra  Fob + in pcp office  Hgb 9.5 in pcp office 7.8 in ed Wbc 5.1, plt 202 Inr 1.9  Na 137, 4.7, cl 101, glu 274, cr 1.34 ( at baseline )  EKG: atrial fibrillation  at 129 Tx  pepcid   20 iv , phenegran     Assessment and Plan:  # Gastric tumor Patient presented with acute Upper Gi bleed -hx of prior Gi bleed and NHL, gastric tumor treated >15 years ago.  -Continue ppi bid iv  - Check serial h/h  -transfuse if < 8 in setting of tachycardia and CAD  -supportive gently ivfs  while npo  GI consulted, subsequent EGD: Gastric tumor at the incisura. Biopsy is contraindicated. Multiple gastric polyps. -hold anti-hypertensive medication  CT chest: 1. Solitary pulmonary nodule in the RIGHT lower lobe is new from 2014. Indeterminate finding. FDG PET scan may aid in determining malignant potential. 2. No mediastinal adenopathy CT A/P: 1. Large mass within the lumen of the stomach. 2. No evidence of metastatic adenopathy in the abdomen pelvis. 3. No liver metastasis. 4. No peritoneal metastasis. D/w  oncologist, recommended tissue biopsy via EGD versus EUS 8/17 s/p EGD, ulcer and bleeding vessel was cauterized by GI and biopsy of gastric mass was done. Hb 6.8, transfuse 1 unit of PRBC today Monitor H&H      # Anemia of blood loss - s/p 1 unit of prbc  -drop in hgb 2 points  9.5-7.8 8/17 Hb 6.8, transfuse 1 unit of PRBC -continue to monitor h/h   # Atrial Fib RVR - will treat with ivfs/and PRBC - prn low dose metoprolol  if above interventions not effective  -hold apixaban       # CAD s/p stent -no active issues  -  # Hx of DVT -one year ago  Currently on Eliquis  due to A-fib   # CKDIIIb # Hypertension  -currently normotensive -insetting of gi bleed hold anti -hypertensive medications    # GERD -ppi    # DMII -iss/fs  -hold other medications currently until taking po    # IBS -no active issues    # Hx of MALT  NHL S/p chemo xrt   # CKDIIIb -at baseline     # HLD -resume home regimen once med rec completed and taking po    Body mass index is 24.68 kg/m.  Interventions:  Diet: FLD DVT Prophylaxis: SCD, pharmacological prophylaxis contraindicated due to GI bleeding   Advance goals of care discussion: Full code  Family  Communication: family was not present at bedside, at the time of interview.  The pt provided permission to discuss medical plan with the family. Opportunity was given to ask question and all questions were answered satisfactorily.   Disposition:  Pt is from home, admitted with gastric tumor, upper GI bleed, still at risk of bleeding and workup pending, which precludes a safe discharge. Discharge to home, when stable, may need few days to improve.  Subjective: No significant events overnight.  Patient was seen after EGD, tolerated procedure well.  Complaining of mild epigastric discomfort after drinking water.  No new other complaints. Patient agreed with repeating H&H and transfusing if hemoglobin drops.   Physical Exam: General:  NAD, lying comfortably Appear in no distress, affect appropriate Eyes: PERRLA ENT: Oral Mucosa Clear, moist  Neck: no JVD,  Cardiovascular: S1 and S2 Present, no Murmur,  Respiratory: good respiratory effort, Bilateral Air entry equal and Decreased, no Crackles, no wheezes Abdomen: BS +, Soft and mild epigastric tenderness,  Skin: no rashes Extremities: no Pedal edema, no calf tenderness Neurologic: without any new focal findings Gait not checked due to patient safety concerns  Vitals:   07/15/24 0915 07/15/24 0928 07/15/24 1025 07/15/24 1242  BP: (!) 113/57 (!) 125/59 (!) 115/57 114/61  Pulse: (!) 111  97 (!) 101  Resp: 19  20 18   Temp:  98.4 F (36.9 C) 98.5 F (36.9 C) 98.4 F (36.9 C)  TempSrc:   Oral Oral  SpO2: 99%  94% 94%  Weight:      Height:        Intake/Output Summary (Last 24 hours) at 07/15/2024 1523 Last data filed at 07/15/2024 1300 Gross per 24 hour  Intake 1747 ml  Output 2 ml  Net 1745 ml   Filed Weights   07/12/24 1626 07/15/24 0804  Weight: 75.8 kg 75.8 kg    Data Reviewed: I have personally reviewed and interpreted daily labs, tele strips, imagings as discussed above. I reviewed all nursing notes, pharmacy notes, vitals, pertinent old records I have discussed plan of care as described above with RN and patient/family.  CBC: Recent Labs  Lab 07/12/24 1042 07/12/24 1627 07/12/24 2130 07/13/24 0753 07/13/24 1458 07/13/24 1459 07/14/24 0539 07/14/24 1428 07/15/24 0513 07/15/24 1441  WBC 4.1 5.1  --  5.2  --   --  3.9*  --  3.1*  --   NEUTROABS 2.8  --   --   --   --   --   --   --   --   --   HGB 9.5* 7.8*   < > 7.9* 8.5* 8.6* 7.6* 7.9* 7.3* 6.8*  HCT 28.8* 23.9*  --  24.9* 26.1*  --  23.5* 23.9* 22.8* 21.0*  MCV 96.8 100.0  --  96.1  --   --  96.7  --  99.1  --   PLT 252.0 202  --  190  --   --  183  --  180  --    < > = values in this interval not displayed.   Basic Metabolic Panel: Recent Labs  Lab 07/12/24 1042 07/12/24 1627  07/13/24 0408 07/13/24 1459 07/14/24 0539 07/15/24 0513  NA 138 137 141  --  137 139  K 5.6 No hemolysis seen* 4.7 4.2  --  4.1 3.8  CL 100 101 108  --  104 109  CO2 31 26 25   --  27 23  GLUCOSE 312* 274* 191*  --  129*  144*  BUN 45* 60* 61*  --  38* 22  CREATININE 1.24* 1.34* 1.28*  --  1.31* 1.16*  CALCIUM 8.9 8.5* 8.1*  --  8.2* 8.2*  MG  --   --   --  1.8 1.9 1.8  PHOS  --   --   --  2.7 3.2 3.4    Studies: No results found.   Scheduled Meds:  sodium chloride    Intravenous Once   sodium chloride    Intravenous Once   diltiazem   30 mg Oral Q6H   insulin  aspart  0-6 Units Subcutaneous Q4H   pantoprazole  (PROTONIX ) IV  40 mg Intravenous Q12H   sertraline   100 mg Oral Daily   Continuous Infusions:  iron  sucrose 300 mg (07/14/24 1737)   promethazine  (PHENERGAN ) injection (IM or IVPB) 6.25 mg (07/13/24 1624)   PRN Meds: acetaminophen , albuterol , oxyCODONE , promethazine  (PHENERGAN ) injection (IM or IVPB)  Time spent: 55 minutes  Author: ELVAN SOR. MD [facility] 07/15/2024 3:23 PM  To reach On-call, see care teams to locate the attending and reach out to them via [email or web address]. If 7PM-7AM, please contact night-coverage If you still have difficulty reaching the attending provider, please page the [facility] (Director on Call) for [facility] on amion for assistance.

## 2024-07-15 NOTE — Anesthesia Preprocedure Evaluation (Addendum)
 Anesthesia Evaluation  Patient identified by MRN, date of birth, ID band Patient awake    Reviewed: Allergy  & Precautions, NPO status , Patient's Chart, lab work & pertinent test results  History of Anesthesia Complications Negative for: history of anesthetic complications  Airway Mallampati: IV   Neck ROM: Full    Dental  (+) Missing, Implants   Pulmonary neg pulmonary ROS   Pulmonary exam normal breath sounds clear to auscultation       Cardiovascular hypertension, + CAD (s/p stents on Eliquis , last dose 07/12/24)  + dysrhythmias (a fib)  Rhythm:Irregular Rate:Normal  Hx DVT  ECG 07/12/24:  Atrial fibrillation with rapid V-rate Low voltage, precordial leads Minimal ST depression   Neuro/Psych  PSYCHIATRIC DISORDERS Anxiety Depression    HOH    GI/Hepatic ,GERD  ,,Gastric tumor   Endo/Other  diabetes, Type 2    Renal/GU Renal disease (stage III CKD; nephrolithiasis)     Musculoskeletal  (+) Arthritis ,    Abdominal   Peds  Hematology  (+) Blood dyscrasia, anemia Non-Hodgkin lymphoma   Anesthesia Other Findings Last dose of Trulicity  07/10/24.    Cardiology note 03/05/24:  Coronary artery disease with stable angina Currently with no symptoms of angina. No further workup at this time. Continue current medication regimen.   Hyperlipidemia On nexlezit, cholesterol at goal Uses patient assistance typically filled out beginning of the year   Essential hypertension Blood pressure is well controlled on today's visit. No changes made to the medications.   Long-term persistent atrial fibrillation Long discussion concerning various treatment options for her atrial fibrillation She reports that prior primary cardiologist in [place] told her to not pursue cardioversion She feels she is asymptomatic, echo with normal ejection fraction She is inclined for rate control rather than rhythm control Tolerating Eliquis   5 twice daily currently   Anemia Prior history of GI bleed, hemoglobin stable, iron  stable   Reproductive/Obstetrics                              Anesthesia Physical Anesthesia Plan  ASA: 3  Anesthesia Plan: General   Post-op Pain Management:    Induction: Intravenous  PONV Risk Score and Plan: 3 and Propofol  infusion, TIVA and Treatment may vary due to age or medical condition  Airway Management Planned: Natural Airway  Additional Equipment:   Intra-op Plan:   Post-operative Plan:   Informed Consent: I have reviewed the patients History and Physical, chart, labs and discussed the procedure including the risks, benefits and alternatives for the proposed anesthesia with the patient or authorized representative who has indicated his/her understanding and acceptance.       Plan Discussed with: CRNA  Anesthesia Plan Comments: (LMA/GETA backup discussed.  Patient consented for risks of anesthesia including but not limited to:  - adverse reactions to medications - damage to eyes, teeth, lips or other oral mucosa - nerve damage due to positioning  - sore throat or hoarseness - damage to heart, brain, nerves, lungs, other parts of body or loss of life  Informed patient about role of CRNA in peri- and intra-operative care.  Patient voiced understanding.)         Anesthesia Quick Evaluation

## 2024-07-15 NOTE — Transfer of Care (Signed)
 Immediate Anesthesia Transfer of Care Note  Patient: [name] [name]  Procedure(s) Performed: EGD (ESOPHAGOGASTRODUODENOSCOPY)  Patient Location: PACU  Anesthesia Type:General  Level of Consciousness: awake, alert , and oriented  Airway & Oxygen Therapy: Patient Spontanous Breathing  Post-op Assessment: Report given to RN and Post -op Vital signs reviewed and stable  Post vital signs: Reviewed and stable  Last Vitals:  Vitals Value Taken Time  BP 134/45 07/15/24 08:57  Temp    Pulse    Resp 22 07/15/24 08:57  SpO2 99 % 07/15/24 08:57  Vitals shown include unfiled device data.  Last Pain:  Vitals:   07/15/24 0804  TempSrc: Temporal  PainSc: 0-No pain         Complications: No notable events documented.

## 2024-07-15 NOTE — Op Note (Signed)
 [facility] Patient Name: [name] [name] Procedure Date: 07/15/2024 7:04 AM MRN: [id number] Account #: [id number] Date of Birth: 05-06-1932 Admit Type: Inpatient Age: 89 Room: [facility] ENDO ROOM 4 Gender: Female Note Status: Finalized Instrument Name: Upper GI Scope [id number] Procedure:             Upper GI endoscopy Indications:           Acute post hemorrhagic anemia, Hematemesis Providers:             Elspeth Ozell Onita ROSALEA, DO Referring MD:          Comer LOIS Gaskins (Referring MD) Medicines:             Monitored Anesthesia Care Complications:         No immediate complications. Estimated blood loss:                         Minimal. Procedure:             Pre-Anesthesia Assessment:                        - Prior to the procedure, a History and Physical was                         performed, and patient medications and allergies were                         reviewed. The patient is competent. The risks and                         benefits of the procedure and the sedation options and                         risks were discussed with the patient. All questions                         were answered and informed consent was obtained.                         Patient identification and proposed procedure were                         verified by the physician, the nurse, the anesthetist                         and the technician in the endoscopy suite. Mental                         Status Examination: alert and oriented. Airway                         Examination: normal oropharyngeal airway and neck                         mobility. Respiratory Examination: clear to                         auscultation. CV Examination: irregularly irregular  rate and rhythm and tachycardia noted. Prophylactic                         Antibiotics: The patient does not require prophylactic                         antibiotics. Prior Anticoagulants: The  patient has                         taken Eliquis  (apixaban ), last dose was 2 days prior                         to procedure. ASA Grade Assessment: III - A patient                         with severe systemic disease. After reviewing the                         risks and benefits, the patient was deemed in                         satisfactory condition to undergo the procedure. The                         anesthesia plan was to use monitored anesthesia care                         (MAC). Immediately prior to administration of                         medications, the patient was re-assessed for adequacy                         to receive sedatives. The heart rate, respiratory                         rate, oxygen saturations, blood pressure, adequacy of                         pulmonary ventilation, and response to care were                         monitored throughout the procedure. The physical                         status of the patient was re-assessed after the                         procedure.                        After obtaining informed consent, the endoscope was                         passed under direct vision. Throughout the procedure,                         the patient's blood pressure, pulse, and oxygen  saturations were monitored continuously. The Endoscope                         was introduced through the mouth, and advanced to the                         second part of duodenum. The upper GI endoscopy was                         accomplished without difficulty. The patient tolerated                         the procedure well. Findings:      The duodenal bulb, first portion of the duodenum and second portion of       the duodenum were normal. Estimated blood loss: none.      The examined esophagus was normal. Estimated blood loss: none.      Esophagogastric landmarks were identified: the gastroesophageal junction       was found at 38 cm from  the incisors.      The Z-line was regular. Estimated blood loss: none.      Multiple 1 to 20 mm pedunculated and sessile polyps with no bleeding and       no stigmata of recent bleeding were found in the entire examined       stomach. Estimated blood loss: none.      A large, submucosal, non-circumferential mass with no bleeding and       stigmata of recent bleeding was found on the greater curvature of the       stomach. Biopsies were taken with a cold forceps for histology.       Estimated blood loss was minimal.      Normal mucosa was found in the gastric antrum. Biopsies were taken with       a cold forceps for Helicobacter pylori testing. Estimated blood loss was       minimal.      One non-bleeding cratered gastric ulcer with a nonbleeding visible       vessel (Forrest Class IIa) was found on the greater curvature of the       stomach. The lesion was 3 mm in largest dimension. Area was successfully       injected with 4 mL of a 0.1 mg/mL solution of epinephrine  for       hemostasis. Coagulation for hemostasis using Gold Probe Cautery was       successful. For hemostasis, hemostatic spray was deployed. 3cc of       purastat sprays were applied. There was no bleeding at the end of the       procedure. Ulcer was located on the mass adjacent to the hyperplastic       polyp, so hyperplastic polyp was not removed given bleeding lesion.       Estimated blood loss was minimal. Impression:            - Normal duodenal bulb, first portion of the duodenum                         and second portion of the duodenum.                        - Normal esophagus.                        -  Esophagogastric landmarks identified.                        - Z-line regular.                        - Multiple gastric polyps.                        - Likely malignant gastric tumor on the greater                         curvature of the stomach. Biopsied.                        - Normal mucosa was found in the  antrum. Biopsied.                        - Non-bleeding gastric ulcer with a nonbleeding                         visible vessel (Forrest Class IIa). Injected. SKIP.                         hemostatic spray applied. Recommendation:        - Return patient to hospital ward for ongoing care.                        - Full liquid diet to resume 4 hours after procedure.                        - Continue present medications.                        - Use Protonix  (pantoprazole ) 40 mg IV BID for 8 weeks.                        - Await pathology results.                        - Will need endoscopic ultrasound as outpatient to                         evaluate and biopsy submucosal mass.                        - No ibuprofen, naproxen , or other non-steroidal                         anti-inflammatory drugs.                        - Resume Eliquis  (apixaban ) at prior dose in 3 days.                         Refer to managing physician for further adjustment of                         therapy.                        -  [facility]                        - The findings and recommendations were discussed with                         the patient's family.                        - The findings and recommendations were discussed with                         the patient. Procedure Code(s):     --- Professional ---                        [phone], 59, Esophagogastroduodenoscopy, flexible,                         transoral; with control of bleeding, any method                        43239, Esophagogastroduodenoscopy, flexible,                         transoral; with biopsy, single or multiple Diagnosis Code(s):     --- Professional ---                        K31.7, Polyp of stomach and duodenum                        D49.0, Neoplasm of unspecified behavior of digestive                         system                        K25.4, Chronic or  unspecified gastric ulcer with                         hemorrhage                        D62, Acute posthemorrhagic anemia                        K92.0, Hematemesis CPT copyright 2022 [employer] Association. All rights reserved. The codes documented in this report are preliminary and upon coder review may  be revised to meet current compliance requirements. Attending Participation:      I personally performed the entire procedure. Elspeth Jungling, DO Elspeth Ozell Jungling DO, DO 07/15/2024 9:04:32 AM This report has been signed electronically. Number of Addenda: 0 Note Initiated On: 07/15/2024 7:04 AM Estimated Blood Loss:  Estimated blood loss was minimal.      [facility]

## 2024-07-15 NOTE — Plan of Care (Signed)
  Problem: Education: Goal: Ability to describe self-care measures that may prevent or decrease complications (Diabetes Survival Skills Education) will improve Outcome: Progressing Goal: Individualized Educational Video(s) Outcome: Progressing   Problem: Coping: Goal: Ability to adjust to condition or change in health will improve Outcome: Progressing   Problem: Nutritional: Goal: Maintenance of adequate nutrition will improve Outcome: Progressing Goal: Progress toward achieving an optimal weight will improve Outcome: Progressing

## 2024-07-16 ENCOUNTER — Encounter: Payer: Self-pay | Admitting: Gastroenterology

## 2024-07-16 DIAGNOSIS — K922 Gastrointestinal hemorrhage, unspecified: Secondary | ICD-10-CM | POA: Diagnosis not present

## 2024-07-16 DIAGNOSIS — K920 Hematemesis: Secondary | ICD-10-CM | POA: Diagnosis not present

## 2024-07-16 DIAGNOSIS — R195 Other fecal abnormalities: Secondary | ICD-10-CM

## 2024-07-16 LAB — BPAM RBC
Blood Product Expiration Date: 202509112359
Blood Product Expiration Date: 202509142359
ISSUE DATE / TIME: 202508142014
ISSUE DATE / TIME: 202508171557
Unit Type and Rh: 6200
Unit Type and Rh: 6200

## 2024-07-16 LAB — HEPATIC FUNCTION PANEL
ALT: 12 U/L (ref 0–44)
AST: 23 U/L (ref 15–41)
Albumin: 3.1 g/dL — ABNORMAL LOW (ref 3.5–5.0)
Alkaline Phosphatase: 25 U/L — ABNORMAL LOW (ref 38–126)
Bilirubin, Direct: 0.2 mg/dL (ref 0.0–0.2)
Indirect Bilirubin: 0.7 mg/dL (ref 0.3–0.9)
Total Bilirubin: 0.9 mg/dL (ref 0.0–1.2)
Total Protein: 5.2 g/dL — ABNORMAL LOW (ref 6.5–8.1)

## 2024-07-16 LAB — BASIC METABOLIC PANEL WITH GFR
Anion gap: 8 (ref 5–15)
BUN: 14 mg/dL (ref 8–23)
CO2: 26 mmol/L (ref 22–32)
Calcium: 8.4 mg/dL — ABNORMAL LOW (ref 8.9–10.3)
Chloride: 107 mmol/L (ref 98–111)
Creatinine, Ser: 1.28 mg/dL — ABNORMAL HIGH (ref 0.44–1.00)
GFR, Estimated: 40 mL/min — ABNORMAL LOW (ref >60)
Glucose, Bld: 142 mg/dL — ABNORMAL HIGH (ref 70–99)
Potassium: 4.3 mmol/L (ref 3.5–5.1)
Sodium: 141 mmol/L (ref 135–145)

## 2024-07-16 LAB — GLUCOSE, CAPILLARY
Glucose-Capillary: 128 mg/dL — ABNORMAL HIGH (ref 70–99)
Glucose-Capillary: 145 mg/dL — ABNORMAL HIGH (ref 70–99)
Glucose-Capillary: 173 mg/dL — ABNORMAL HIGH (ref 70–99)
Glucose-Capillary: 130 mg/dL — ABNORMAL HIGH (ref 70–99)
Glucose-Capillary: 267 mg/dL — ABNORMAL HIGH (ref 70–99)

## 2024-07-16 LAB — TYPE AND SCREEN
ABO/RH(D): A POS
ABO/RH(D): A POS
Antibody Screen: NEGATIVE
Antibody Screen: NEGATIVE
Unit division: 0
Unit division: 0

## 2024-07-16 LAB — CBC
HCT: 26.1 % — ABNORMAL LOW (ref 36.0–46.0)
Hemoglobin: 8.4 g/dL — ABNORMAL LOW (ref 12.0–15.0)
MCH: 31.6 pg (ref 26.0–34.0)
MCHC: 32.2 g/dL (ref 30.0–36.0)
MCV: 98.1 fL (ref 80.0–100.0)
Platelets: 192 K/uL (ref 150–400)
RBC: 2.66 MIL/uL — ABNORMAL LOW (ref 3.87–5.11)
RDW: 19.4 % — ABNORMAL HIGH (ref 11.5–15.5)
WBC: 3.2 K/uL — ABNORMAL LOW (ref 4.0–10.5)
nRBC: 0.6 % — ABNORMAL HIGH (ref 0.0–0.2)

## 2024-07-16 LAB — MAGNESIUM: Magnesium: 1.9 mg/dL (ref 1.7–2.4)

## 2024-07-16 LAB — PROTIME-INR
INR: 1.2 (ref 0.8–1.2)
Prothrombin Time: 16 s — ABNORMAL HIGH (ref 11.4–15.2)

## 2024-07-16 LAB — PHOSPHORUS: Phosphorus: 3 mg/dL (ref 2.5–4.6)

## 2024-07-16 NOTE — Progress Notes (Signed)
 [facility] Progress Note  Patient: [name] [name]    FMW:[id number]  DOA: 07/12/2024     Date of Service: the patient was seen and examined on 07/16/2024  Chief Complaint  Patient presents with   GI Problem   Brief hospital course: [name] [name] is a 89 y.o. female with medical history significant of  Persistent Atrial fibrillation on eliquis , hx of DVT 1 year ago, HTN ,CAD, mitral valve prolapse,GERD, IBS-C,DMII, MALT lymphoma,CKDIIIa, IDA who presents to ED with complaints of coffee grounds emesis as well as dark stools that started last pm. Patient followed up with pcp this am and was given antiemetic and told to go to ER if symptoms did not improve.  Due to persistent symptoms patient presented to ED. Patient notes she has not had any further emesis or black stools. She does however noted  continued nausea and epigastric tenderness.     ED Course:  Temp 99, bp 122/60, hr 110, sat 97% on ra  Fob + in pcp office  Hgb 9.5 in pcp office 7.8 in ed Wbc 5.1, plt 202 Inr 1.9  Na 137, 4.7, cl 101, glu 274, cr 1.34 ( at baseline )  EKG: atrial fibrillation  at 129 Tx  pepcid   20 iv , phenegran     Assessment and Plan:  # Gastric tumor Patient presented with acute Upper Gi bleed -hx of prior Gi bleed and NHL, gastric tumor treated >15 years ago.  -Continue ppi bid iv  - Check serial h/h  -transfuse if < 8 in setting of tachycardia and CAD  -supportive gently ivfs  while npo  GI consulted, subsequent EGD: Gastric tumor at the incisura. Biopsy is contraindicated. Multiple gastric polyps. -hold anti-hypertensive medication  CT chest: 1. Solitary pulmonary nodule in the RIGHT lower lobe is new from 2014. Indeterminate finding. FDG PET scan may aid in determining malignant potential. 2. No mediastinal adenopathy CT A/P: 1. Large mass within the lumen of the stomach. 2. No evidence of metastatic adenopathy in the abdomen pelvis. 3. No liver metastasis. 4. No peritoneal metastasis. D/w  oncologist, recommended tissue biopsy via EGD versus EUS 8/17 s/p EGD, ulcer and bleeding vessel was cauterized by GI and biopsy of gastric mass was done. Hb 6.8, transfuse 1 unit of PRBC today 8/18 Hb 8.4, stable no bleeding.  Diet advanced to soft diet Discussed with GI, patient will be kept n.p.o. after midnight for possible repeat EGD tomorrow a.m. if recurrent dark stools or hemoglobin drops. Monitor H&H      # Anemia of blood loss - s/p 1 unit of prbc  -drop in hgb 2 points  9.5-7.8 8/17 Hb 6.8, transfuse 1 unit of PRBC 8/18 Hb 8.4 after transfusion, no active bleeding -continue to monitor h/h   # Atrial Fib RVR - will treat with ivfs/and PRBC - prn low dose metoprolol  if above interventions not effective  -hold apixaban       # CAD s/p stent -no active issues  -  # Hx of DVT -one year ago  Currently on Eliquis  due to A-fib   # CKDIIIb # Hypertension  -currently normotensive -insetting of gi bleed hold anti -hypertensive medications    # GERD -ppi    # DMII -iss/fs  -hold other medications currently until taking po    # IBS -no active issues    # Hx of MALT  NHL S/p chemo xrt   # CKDIIIb -at baseline     # HLD -resume home  regimen once med rec completed and taking po    Body mass index is 24.68 kg/m.  Interventions:  Diet: FLD>>soft diet DVT Prophylaxis: SCD, pharmacological prophylaxis contraindicated due to GI bleeding   Advance goals of care discussion: Full code  Family Communication: family was not present at bedside, at the time of interview.  The pt provided permission to discuss medical plan with the family. Opportunity was given to ask question and all questions were answered satisfactorily.   Disposition:  Pt is from home, admitted with gastric tumor, upper GI bleed, still at risk of bleeding and s/p EGD, may need to repeat EGD tomorrow a.m.  which precludes a safe discharge. Discharge to home, when stable, may need few days to  improve.  Subjective: No significant events overnight.  Patient denies any abdominal pain, tolerated FolicA diet well.  No any active bleeding We will continue to monitor today and plan for discharge tomorrow a.m. if remains stable   Physical Exam: General: NAD, lying comfortably Appear in no distress, affect appropriate Eyes: PERRLA ENT: Oral Mucosa Clear, moist  Neck: no JVD,  Cardiovascular: S1 and S2 Present, no Murmur,  Respiratory: good respiratory effort, Bilateral Air entry equal and Decreased, no Crackles, no wheezes Abdomen: BS +, Soft and mild epigastric tenderness,  Skin: no rashes Extremities: no Pedal edema, no calf tenderness Neurologic: without any new focal findings Gait not checked due to patient safety concerns  Vitals:   07/16/24 0509 07/16/24 0757 07/16/24 1214 07/16/24 1559  BP: 124/60 (!) 113/59 (!) 120/59 116/82  Pulse:  84 93 81  Resp:  18 18 18   Temp:  98.1 F (36.7 C) 98.2 F (36.8 C) 98.1 F (36.7 C)  TempSrc:  Oral Oral Oral  SpO2:  92% 95% 94%  Weight:      Height:        Intake/Output Summary (Last 24 hours) at 07/16/2024 1655 Last data filed at 07/16/2024 1422 Gross per 24 hour  Intake 1126.38 ml  Output --  Net 1126.38 ml   Filed Weights   07/12/24 1626 07/15/24 0804  Weight: 75.8 kg 75.8 kg    Data Reviewed: I have personally reviewed and interpreted daily labs, tele strips, imagings as discussed above. I reviewed all nursing notes, pharmacy notes, vitals, pertinent old records I have discussed plan of care as described above with RN and patient/family.  CBC: Recent Labs  Lab 07/12/24 1042 07/12/24 1627 07/12/24 2130 07/13/24 0753 07/13/24 1458 07/14/24 0539 07/14/24 1428 07/15/24 0513 07/15/24 1441 07/16/24 0434  WBC 4.1 5.1  --  5.2  --  3.9*  --  3.1*  --  3.2*  NEUTROABS 2.8  --   --   --   --   --   --   --   --   --   HGB 9.5* 7.8*   < > 7.9*   < > 7.6* 7.9* 7.3* 6.8* 8.4*  HCT 28.8* 23.9*  --  24.9*   < > 23.5*  23.9* 22.8* 21.0* 26.1*  MCV 96.8 100.0  --  96.1  --  96.7  --  99.1  --  98.1  PLT 252.0 202  --  190  --  183  --  180  --  192   < > = values in this interval not displayed.   Basic Metabolic Panel: Recent Labs  Lab 07/12/24 1627 07/13/24 0408 07/13/24 1459 07/14/24 0539 07/15/24 0513 07/16/24 0434  NA 137 141  --  137 139  141  K 4.7 4.2  --  4.1 3.8 4.3  CL 101 108  --  104 109 107  CO2 26 25  --  27 23 26   GLUCOSE 274* 191*  --  129* 144* 142*  BUN 60* 61*  --  38* 22 14  CREATININE 1.34* 1.28*  --  1.31* 1.16* 1.28*  CALCIUM 8.5* 8.1*  --  8.2* 8.2* 8.4*  MG  --   --  1.8 1.9 1.8 1.9  PHOS  --   --  2.7 3.2 3.4 3.0    Studies: No results found.   Scheduled Meds:  sodium chloride    Intravenous Once   diltiazem   30 mg Oral Q6H   insulin  aspart  0-6 Units Subcutaneous Q4H   pantoprazole  (PROTONIX ) IV  40 mg Intravenous Q12H   sertraline   100 mg Oral Daily   Continuous Infusions:  promethazine  (PHENERGAN ) injection (IM or IVPB) 6.25 mg (07/13/24 1624)   PRN Meds: acetaminophen , albuterol , oxyCODONE , promethazine  (PHENERGAN ) injection (IM or IVPB)  Time spent: 35 minutes  Author: ELVAN SOR. MD [facility] 07/16/2024 4:55 PM  To reach On-call, see care teams to locate the attending and reach out to them via [email or web address]. If 7PM-7AM, please contact night-coverage If you still have difficulty reaching the attending provider, please page the [facility] (Director on Call) for [facility] on amion for assistance.

## 2024-07-16 NOTE — Care Management Important Message (Signed)
 Important Message  Patient Details  Name: [name] [name] MRN: [id number] Date of Birth: [birth year]   Important Message Given:  Yes - Medicare IM     Rojelio SHAUNNA Rattler 07/16/2024, 2:12 PM

## 2024-07-16 NOTE — Plan of Care (Signed)
   Problem: Coping: Goal: Ability to adjust to condition or change in health will improve Outcome: Progressing   Problem: Coping: Goal: Level of anxiety will decrease Outcome: Progressing   Problem: Pain Managment: Goal: General experience of comfort will improve and/or be controlled Outcome: Progressing   Problem: Safety: Goal: Ability to remain free from injury will improve Outcome: Progressing

## 2024-07-16 NOTE — Plan of Care (Signed)

## 2024-07-16 NOTE — Progress Notes (Signed)
    [name] Copping, MD [facility]   [address] [place], [place] [address] Phone: [phone] Fax : [phone]   Subjective: The patient denies any problems today although she states that she has had 3 bowel movements that were dark. The patient's hemoglobin yesterday was 6.8 and the patient's repeat this morning after transfusion was 8.4.  She denies any abdominal pain nausea vomiting fevers or chills. The patient did have an upper endoscopy yesterday by Dr. Onita that showed a gastric tumor with a nonbleeding ulcer that was treated.   Objective: Vital signs in last 24 hours: Vitals:   07/16/24 0358 07/16/24 0509 07/16/24 0757 07/16/24 1214  BP: 124/60 124/60 (!) 113/59 (!) 120/59  Pulse: 89  84 93  Resp: 17  18 18   Temp: 97.8 F (36.6 C)  98.1 F (36.7 C) 98.2 F (36.8 C)  TempSrc: Oral  Oral Oral  SpO2: 96%  92% 95%  Weight:      Height:       Weight change:   Intake/Output Summary (Last 24 hours) at 07/16/2024 1502 Last data filed at 07/16/2024 1422 Gross per 24 hour  Intake 1126.38 ml  Output --  Net 1126.38 ml     Exam: General: The patient is laying in bed in no apparent distress alert and oriented x 3   Lab Results: @LABTEST2 @ Micro Results: No results found for this or any previous visit (from the past 240 hours). Studies/Results: No results found. Medications: I have reviewed the patient's current medications. Scheduled Meds:  sodium chloride    Intravenous Once   diltiazem   30 mg Oral Q6H   insulin  aspart  0-6 Units Subcutaneous Q4H   pantoprazole  (PROTONIX ) IV  40 mg Intravenous Q12H   sertraline   100 mg Oral Daily   Continuous Infusions:  promethazine  (PHENERGAN ) injection (IM or IVPB) 6.25 mg (07/13/24 1624)   PRN Meds:.acetaminophen , albuterol , oxyCODONE , promethazine  (PHENERGAN ) injection (IM or IVPB)   Assessment: Principal Problem:   GI bleed Active Problems:   Coffee ground emesis    Plan: The patient will be made n.p.o.  after midnight for possible EGD for tomorrow.  If the patient's hemoglobin remains stable and her vitals remained stable then this may just be passage of the old blood.  If the hemoglobin should decrease or the patient should show signs of GI bleeding then a repeat EGD will be done tomorrow.  The patient has been explained the plan and agrees with.   LOS: 4 days   [name] Copping, MD.FACG 07/16/2024, 3:02 PM Pager [phone] 7am-5pm  Check AMION for 5pm -7am coverage and on weekends

## 2024-07-17 DIAGNOSIS — K920 Hematemesis: Secondary | ICD-10-CM | POA: Diagnosis not present

## 2024-07-17 LAB — CBC
HCT: 25.8 % — ABNORMAL LOW (ref 36.0–46.0)
Hemoglobin: 8.5 g/dL — ABNORMAL LOW (ref 12.0–15.0)
MCH: 32.1 pg (ref 26.0–34.0)
MCHC: 32.9 g/dL (ref 30.0–36.0)
MCV: 97.4 fL (ref 80.0–100.0)
Platelets: 205 K/uL (ref 150–400)
RBC: 2.65 MIL/uL — ABNORMAL LOW (ref 3.87–5.11)
RDW: 19.3 % — ABNORMAL HIGH (ref 11.5–15.5)
WBC: 3.8 K/uL — ABNORMAL LOW (ref 4.0–10.5)
nRBC: 0.5 % — ABNORMAL HIGH (ref 0.0–0.2)

## 2024-07-17 LAB — BASIC METABOLIC PANEL WITH GFR
Anion gap: 7 (ref 5–15)
BUN: 14 mg/dL (ref 8–23)
CO2: 26 mmol/L (ref 22–32)
Calcium: 8.5 mg/dL — ABNORMAL LOW (ref 8.9–10.3)
Chloride: 106 mmol/L (ref 98–111)
Creatinine, Ser: 1.2 mg/dL — ABNORMAL HIGH (ref 0.44–1.00)
GFR, Estimated: 43 mL/min — ABNORMAL LOW (ref >60)
Glucose, Bld: 153 mg/dL — ABNORMAL HIGH (ref 70–99)
Potassium: 4.2 mmol/L (ref 3.5–5.1)
Sodium: 139 mmol/L (ref 135–145)

## 2024-07-17 LAB — GLUCOSE, CAPILLARY
Glucose-Capillary: 161 mg/dL — ABNORMAL HIGH (ref 70–99)
Glucose-Capillary: 131 mg/dL — ABNORMAL HIGH (ref 70–99)
Glucose-Capillary: 196 mg/dL — ABNORMAL HIGH (ref 70–99)
Glucose-Capillary: 141 mg/dL — ABNORMAL HIGH (ref 70–99)

## 2024-07-17 LAB — SURGICAL PATHOLOGY

## 2024-07-17 MED ORDER — ENOXAPARIN SODIUM 40 MG/0.4ML IJ SOSY
40.0000 mg | PREFILLED_SYRINGE | Freq: Every evening | INTRAMUSCULAR | Status: DC
  Administered 2024-07-17 – 2024-07-18 (×2): 40 mg via SUBCUTANEOUS
  Filled 2024-07-17 (×2): qty 0.4

## 2024-07-17 MED ORDER — SODIUM CHLORIDE 0.9 % IV SOLN
1.0000 g | INTRAVENOUS | Status: DC
  Administered 2024-07-17: 1 g via INTRAVENOUS
  Filled 2024-07-17 (×2): qty 10

## 2024-07-17 MED ORDER — SODIUM CHLORIDE 0.9 % IV SOLN: 12.5000 mg | Freq: Four times a day (QID) | INTRAVENOUS | Status: DC | PRN

## 2024-07-17 MED ORDER — SODIUM CHLORIDE 0.9 % IV SOLN
100.0000 mg | Freq: Two times a day (BID) | INTRAVENOUS | Status: DC
  Administered 2024-07-17 – 2024-07-18 (×2): 100 mg via INTRAVENOUS
  Filled 2024-07-17 (×3): qty 100

## 2024-07-17 MED ORDER — ONDANSETRON HCL 4 MG/2ML IJ SOLN
4.0000 mg | Freq: Four times a day (QID) | INTRAMUSCULAR | Status: DC | PRN
  Administered 2024-07-17: 4 mg via INTRAVENOUS
  Filled 2024-07-17: qty 2

## 2024-07-17 NOTE — Plan of Care (Signed)

## 2024-07-17 NOTE — Plan of Care (Signed)

## 2024-07-17 NOTE — TOC CM/SW Note (Signed)
 Transition of Care [facility]) - Inpatient Brief Assessment   Patient Details  Name: [name] [name] MRN: [id number] Date of Birth: 1931/12/13  Transition of Care [facility]) CM/SW Contact:    Lauraine JAYSON Carpen, LCSW Phone Number: 07/17/2024, 12:45 PM   Clinical Narrative: CSW reviewed chart. No TOC needs identified so far. CSW will continue to follow progress. Please place [facility] consult if any needs arise.  Transition of Care Asessment: Insurance and Status: Insurance coverage has been reviewed Patient has primary care physician: Yes Home environment has been reviewed: Single family home Prior level of function:: Not documented Prior/Current Home Services: No current home services Social Drivers of Health Review: SDOH reviewed no interventions necessary Readmission risk has been reviewed: Yes Transition of care needs: no transition of care needs at this time

## 2024-07-17 NOTE — Progress Notes (Signed)
 [name] [name]   DOB:[birth year]   FM#:[id number]    Subjective: Patient resting in the bed having the lunch.  Accompanied by her niece.  Denies any blood in stools black-colored stools.  No nausea no vomiting.  Objective:  Vitals:   07/17/24 1212 07/17/24 1614  BP: (!) 142/83 (!) 145/75  Pulse: 96 100  Resp: 19 16  Temp: (!) 97.5 F (36.4 C) 97.6 F (36.4 C)  SpO2: 94% 95%     Intake/Output Summary (Last 24 hours) at 07/17/2024 1629 Last data filed at 07/17/2024 1300 Gross per 24 hour  Intake 840 ml  Output 600 ml  Net 240 ml    Physical Exam Vitals and nursing note reviewed.  HENT:     Head: Normocephalic and atraumatic.     Mouth/Throat:     Pharynx: Oropharynx is clear.  Eyes:     Extraocular Movements: Extraocular movements intact.     Pupils: Pupils are equal, round, and reactive to light.  Cardiovascular:     Rate and Rhythm: Normal rate and regular rhythm.  Pulmonary:     Comments: Decreased breath sounds bilaterally.  Abdominal:     Palpations: Abdomen is soft.  Musculoskeletal:        General: Normal range of motion.     Cervical back: Normal range of motion.  Skin:    General: Skin is warm.  Neurological:     General: No focal deficit present.     Mental Status: She is alert and oriented to person, place, and time.  Psychiatric:        Behavior: Behavior normal.        Judgment: Judgment normal.      Labs:  Lab Results  Component Value Date   WBC 3.8 (L) 07/17/2024   HGB 8.5 (L) 07/17/2024   HCT 25.8 (L) 07/17/2024   MCV 97.4 07/17/2024   PLT 205 07/17/2024   NEUTROABS 2.8 07/12/2024    Lab Results  Component Value Date   NA 139 07/17/2024   K 4.2 07/17/2024   CL 106 07/17/2024   CO2 26 07/17/2024    Studies:  No results found.  No problem-specific Assessment & Plan notes found for this encounter.  # 89 year old female patient with history of A-fib-on Eliquis ; CKD-and history of gastric mass is currently admitted to hospital for  hematemesis and melena-with acute on chronic anemia.   # .EGD shows a sessile mass in the stomach.  S/p biopsy-negative for malignancy however possibility of underlying malignancy quite high.  Will discuss with GI regarding endoscopic ultrasound the biopsy outpatient.   # Iron  deficient anemia secondary to above-status post PRBC transfusion-also status post IV iron  infusion.  Clinically stable.   # History of A-fib [Dr.Gollan]-  currently on Eliquis  on hold -defer to primary service/cardiology regarding continuation of Eliquis .  I suspect patient is high risk of bleeding with ongoing anticoagulation.  Cindy JONELLE Joe, MD 07/17/2024  4:29 PM

## 2024-07-17 NOTE — Progress Notes (Signed)
 [facility] Progress Note  Patient: [name] [name]    FMW:[id number]  DOA: 07/12/2024     Date of Service: the patient was seen and examined on 07/17/2024  Chief Complaint  Patient presents with   GI Problem   Brief hospital course: ELVENIA GODDEN is a 89 y.o. female with medical history significant of  Persistent Atrial fibrillation on eliquis , hx of DVT 1 year ago, HTN ,CAD, mitral valve prolapse,GERD, IBS-C,DMII, MALT lymphoma,CKDIIIa, IDA who presents to ED with complaints of coffee grounds emesis as well as dark stools that started last pm. Patient followed up with pcp this am and was given antiemetic and told to go to ER if symptoms did not improve.  Due to persistent symptoms patient presented to ED. Patient notes she has not had any further emesis or black stools. She does however noted  continued nausea and epigastric tenderness.     ED Course:  Temp 99, bp 122/60, hr 110, sat 97% on ra  Fob + in pcp office  Hgb 9.5 in pcp office 7.8 in ed Wbc 5.1, plt 202 Inr 1.9  Na 137, 4.7, cl 101, glu 274, cr 1.34 ( at baseline )  EKG: atrial fibrillation  at 129 Tx  pepcid   20 iv , phenegran     Assessment and Plan:  # Gastric tumor Patient presented with acute Upper Gi bleed -hx of prior Gi bleed and NHL, gastric tumor treated >15 years ago.  -Continue ppi bid iv  - Check serial h/h  -transfuse if < 8 in setting of tachycardia and CAD  -supportive gently ivfs  while npo  GI consulted, subsequent EGD: Gastric tumor at the incisura. Biopsy is contraindicated. Multiple gastric polyps. -hold anti-hypertensive medication  CT chest: 1. Solitary pulmonary nodule in the RIGHT lower lobe is new from 2014. Indeterminate finding. FDG PET scan may aid in determining malignant potential. 2. No mediastinal adenopathy CT A/P: 1. Large mass within the lumen of the stomach. 2. No evidence of metastatic adenopathy in the abdomen pelvis. 3. No liver metastasis. 4. No peritoneal metastasis. D/w  oncologist, recommended tissue biopsy via EGD versus EUS 8/17 s/p EGD, ulcer and bleeding vessel was cauterized by GI and biopsy of gastric mass was done. Hb 6.8, transfuse 1 unit of PRBC today 8/18 Hb 8.4, stable no bleeding.  Diet advanced to soft diet 8/19 H&H stable, no active GI bleeding, no need of repeat EGD as per GI. Patient will be scheduled for EUS biopsy as an outpatient for definitive diagnosis and management as per oncologist.    # Anemia of blood loss - s/p 1 unit of prbc  -drop in hgb 2 points  9.5-7.8 8/17 Hb 6.8, transfuse 1 unit of PRBC 8/1 Hb 8.5 stable, no active bleeding -continue to monitor h/h   # Right antecubital cellulitis s/p IV access Developed some redness and tenderness on 8/18, it got worse on 8/19, looks cellulitis.   started antibiotics ceftriaxone  and doxycycline  on 8/19 Follow venous duplex to rule out thrombophlebitis/thrombus    # Atrial Fib RVR - will treat with ivfs/and PRBC - prn low dose metoprolol  if above interventions not effective  -hold apixaban       # CAD s/p stent -no active issues  -  # Hx of DVT -one year ago  Currently on Eliquis  due to A-fib   # CKDIIIb # Hypertension  -currently normotensive -insetting of gi bleed hold anti -hypertensive medications    # GERD -ppi    #  DMII -iss/fs  -hold other medications currently until taking po    # IBS -no active issues    # Hx of MALT  NHL S/p chemo xrt   # CKDIIIb -at baseline     # HLD -resume home regimen once med rec completed and taking po    Body mass index is 24.68 kg/m.  Interventions:  Diet: FLD>>soft diet DVT Prophylaxis: SCD, pharmacological prophylaxis contraindicated due to GI bleeding   Advance goals of care discussion: Full code  Family Communication: family was not present at bedside, at the time of interview.  The pt provided permission to discuss medical plan with the family. Opportunity was given to ask question and all questions were  answered satisfactorily.   Disposition:  Pt is from home, admitted with gastric tumor, upper GI bleed, still at risk of bleeding and s/p EGD, may need to repeat EGD tomorrow a.m.  which precludes a safe discharge. Discharge to home, when stable, may need few days to improve.  Subjective: No significant events overnight. C/o swelling of right antecubital area and tenderness.  It seems cellulitis, patient agreed to start IV antibiotics. Denied any GI bleeding, no abdominal pain.   Physical Exam: General: NAD, lying comfortably Appear in no distress, affect appropriate Eyes: PERRLA ENT: Oral Mucosa Clear, moist  Neck: no JVD,  Cardiovascular: S1 and S2 Present, no Murmur,  Respiratory: good respiratory effort, Bilateral Air entry equal and Decreased, no Crackles, no wheezes Abdomen: BS +, Soft and mild epigastric tenderness,  Skin: Erythema and tenderness at right antecubital area, most likely cellulitis Extremities: no Pedal edema, no calf tenderness Neurologic: without any new focal findings Gait not checked due to patient safety concerns  Vitals:   07/17/24 0809 07/17/24 0949 07/17/24 1212 07/17/24 1614  BP: 128/65  (!) 142/83 (!) 145/75  Pulse: 90  96 100  Resp: 17  19 16   Temp: 98.5 F (36.9 C) 98.4 F (36.9 C) (!) 97.5 F (36.4 C) 97.6 F (36.4 C)  TempSrc: Oral Oral Oral Oral  SpO2: 96%  94% 95%  Weight:      Height:        Intake/Output Summary (Last 24 hours) at 07/17/2024 1733 Last data filed at 07/17/2024 1700 Gross per 24 hour  Intake 840 ml  Output 500 ml  Net 340 ml   Filed Weights   07/12/24 1626 07/15/24 0804  Weight: 75.8 kg 75.8 kg    Data Reviewed: I have personally reviewed and interpreted daily labs, tele strips, imagings as discussed above. I reviewed all nursing notes, pharmacy notes, vitals, pertinent old records I have discussed plan of care as described above with RN and patient/family.  CBC: Recent Labs  Lab 07/12/24 1042  07/12/24 1627 07/13/24 0753 07/13/24 1458 07/14/24 0539 07/14/24 1428 07/15/24 0513 07/15/24 1441 07/16/24 0434 07/17/24 0540  WBC 4.1   < > 5.2  --  3.9*  --  3.1*  --  3.2* 3.8*  NEUTROABS 2.8  --   --   --   --   --   --   --   --   --   HGB 9.5*   < > 7.9*   < > 7.6* 7.9* 7.3* 6.8* 8.4* 8.5*  HCT 28.8*   < > 24.9*   < > 23.5* 23.9* 22.8* 21.0* 26.1* 25.8*  MCV 96.8   < > 96.1  --  96.7  --  99.1  --  98.1 97.4  PLT 252.0   < >  190  --  183  --  180  --  192 205   < > = values in this interval not displayed.   Basic Metabolic Panel: Recent Labs  Lab 07/13/24 0408 07/13/24 1459 07/14/24 0539 07/15/24 0513 07/16/24 0434 07/17/24 0540  NA 141  --  137 139 141 139  K 4.2  --  4.1 3.8 4.3 4.2  CL 108  --  104 109 107 106  CO2 25  --  27 23 26 26   GLUCOSE 191*  --  129* 144* 142* 153*  BUN 61*  --  38* 22 14 14   CREATININE 1.28*  --  1.31* 1.16* 1.28* 1.20*  CALCIUM 8.1*  --  8.2* 8.2* 8.4* 8.5*  MG  --  1.8 1.9 1.8 1.9  --   PHOS  --  2.7 3.2 3.4 3.0  --     Studies: No results found.   Scheduled Meds:  sodium chloride    Intravenous Once   diltiazem   30 mg Oral Q6H   enoxaparin  (LOVENOX ) injection  40 mg Subcutaneous QPM   insulin  aspart  0-6 Units Subcutaneous Q4H   pantoprazole  (PROTONIX ) IV  40 mg Intravenous Q12H   sertraline   100 mg Oral Daily   Continuous Infusions:  cefTRIAXone  (ROCEPHIN )  IV 1 g (07/17/24 1231)   doxycycline  (VIBRAMYCIN ) IV 100 mg (07/17/24 1357)   promethazine  (PHENERGAN ) injection (IM or IVPB)     PRN Meds: acetaminophen , albuterol , ondansetron  (ZOFRAN ) IV, oxyCODONE , promethazine  (PHENERGAN ) injection (IM or IVPB)  Time spent: 35 minutes  Author: ELVAN SOR. MD [facility] 07/17/2024 5:33 PM  To reach On-call, see care teams to locate the attending and reach out to them via [email or web address]. If 7PM-7AM, please contact night-coverage If you still have difficulty reaching the attending provider, please page the [facility]  (Director on Call) for [facility] on amion for assistance.

## 2024-07-18 ENCOUNTER — Inpatient Hospital Stay

## 2024-07-18 ENCOUNTER — Telehealth: Payer: Self-pay

## 2024-07-18 ENCOUNTER — Other Ambulatory Visit: Payer: Self-pay

## 2024-07-18 DIAGNOSIS — D490 Neoplasm of unspecified behavior of digestive system: Secondary | ICD-10-CM | POA: Diagnosis not present

## 2024-07-18 DIAGNOSIS — K922 Gastrointestinal hemorrhage, unspecified: Secondary | ICD-10-CM | POA: Diagnosis not present

## 2024-07-18 DIAGNOSIS — K3189 Other diseases of stomach and duodenum: Secondary | ICD-10-CM

## 2024-07-18 LAB — GLUCOSE, CAPILLARY
Glucose-Capillary: 155 mg/dL — ABNORMAL HIGH (ref 70–99)
Glucose-Capillary: 156 mg/dL — ABNORMAL HIGH (ref 70–99)
Glucose-Capillary: 175 mg/dL — ABNORMAL HIGH (ref 70–99)
Glucose-Capillary: 188 mg/dL — ABNORMAL HIGH (ref 70–99)
Glucose-Capillary: 220 mg/dL — ABNORMAL HIGH (ref 70–99)

## 2024-07-18 LAB — CBC
HCT: 26.4 % — ABNORMAL LOW (ref 36.0–46.0)
Hemoglobin: 8.7 g/dL — ABNORMAL LOW (ref 12.0–15.0)
MCH: 32.2 pg (ref 26.0–34.0)
MCHC: 33 g/dL (ref 30.0–36.0)
MCV: 97.8 fL (ref 80.0–100.0)
Platelets: 203 K/uL (ref 150–400)
RBC: 2.7 MIL/uL — ABNORMAL LOW (ref 3.87–5.11)
RDW: 18.9 % — ABNORMAL HIGH (ref 11.5–15.5)
WBC: 4.6 K/uL (ref 4.0–10.5)
nRBC: 0 % (ref 0.0–0.2)

## 2024-07-18 LAB — BASIC METABOLIC PANEL WITH GFR
Anion gap: 6 (ref 5–15)
BUN: 11 mg/dL (ref 8–23)
CO2: 26 mmol/L (ref 22–32)
Calcium: 8.1 mg/dL — ABNORMAL LOW (ref 8.9–10.3)
Chloride: 105 mmol/L (ref 98–111)
Creatinine, Ser: 1.06 mg/dL — ABNORMAL HIGH (ref 0.44–1.00)
GFR, Estimated: 50 mL/min — ABNORMAL LOW (ref >60)
Glucose, Bld: 139 mg/dL — ABNORMAL HIGH (ref 70–99)
Potassium: 3.5 mmol/L (ref 3.5–5.1)
Sodium: 137 mmol/L (ref 135–145)

## 2024-07-18 LAB — IRON AND TIBC
Iron: 21 ug/dL — ABNORMAL LOW (ref 28–170)
Saturation Ratios: 9 % — ABNORMAL LOW (ref 10.4–31.8)
TIBC: 238 ug/dL — ABNORMAL LOW (ref 250–450)
UIBC: 217 ug/dL

## 2024-07-18 MED ORDER — METOPROLOL SUCCINATE ER 50 MG PO TB24
50.0000 mg | ORAL_TABLET | Freq: Every day | ORAL | Status: DC
  Administered 2024-07-18: 50 mg via ORAL
  Filled 2024-07-18: qty 1

## 2024-07-18 MED ORDER — LACTULOSE 10 GM/15ML PO SOLN
20.0000 g | Freq: Once | ORAL | Status: AC
  Administered 2024-07-18: 20 g via ORAL
  Filled 2024-07-18: qty 30

## 2024-07-18 MED ORDER — PANTOPRAZOLE SODIUM 40 MG PO TBEC: 40.0000 mg | DELAYED_RELEASE_TABLET | Freq: Two times a day (BID) | ORAL | 0 refills | Status: DC

## 2024-07-18 MED ORDER — IRON SUCROSE 300 MG IVPB - SIMPLE MED
300.0000 mg | Freq: Once | Status: AC
  Administered 2024-07-18: 300 mg via INTRAVENOUS
  Filled 2024-07-18: qty 265

## 2024-07-18 NOTE — Telephone Encounter (Signed)
 Referral sent to [facility] for EUS to evaluate gastric mass.

## 2024-07-18 NOTE — Discharge Instructions (Signed)
 Please resume your eliquis  8/22 (Friday).  Please schedule an appointment with your GI doctor, Dr. Albertus for further evaluation of your stomach mass.   Please avoid taking medications like ibuprofen, advil, excedrin (NSAIDs) as these can increase your risk of bleeding.

## 2024-07-18 NOTE — Progress Notes (Signed)
 Patient ambulated 1 lap in hallway on room air.   Patient Saturations on Room Air at Rest = 92%  Patient Saturations on [employer] while Ambulating = 84-90%

## 2024-07-18 NOTE — Progress Notes (Signed)
 This patient had a large gastric lesion found on her EGD after presenting with coffee-ground emesis.  The patient had EGD that showed this lesion previously and her most recent EGD treated a ulcer that was found at that time.  The patient's hemoglobin has been stable.  The patient will need an endoscopic ultrasound to follow-up on this lesion to see what this lesion may be since the biopsies were negative.  With the patient's hemoglobin being stable there is nothing further to do from a GI point of view at this time.  The patient should follow-up as an outpatient with Dr. Albertus in [place] who has seen her in the past.  I will sign off.  Please call if any further GI concerns or questions.  We would like to thank you for the opportunity to participate in the care of [name] [name].

## 2024-07-18 NOTE — Hospital Course (Signed)
 No BM since in the hospitalized. No emesis either.   Physical Exam  Constitutional: In no distress.  Cardiovascular: Normal rate, regular rhythm. No lower extremity edema  Pulmonary: Non labored breathing on room air, no wheezing or rales.  No lower extremity edema Abdominal: Soft.  Non distended and non tender Musculoskeletal: Normal range of motion.     Neurological: Alert and oriented to person, place, and time. Non focal  Skin: Skin is warm and dry.

## 2024-07-19 ENCOUNTER — Other Ambulatory Visit: Payer: Self-pay | Admitting: *Deleted

## 2024-07-19 ENCOUNTER — Telehealth: Payer: Self-pay | Admitting: *Deleted

## 2024-07-19 ENCOUNTER — Telehealth: Payer: Self-pay | Admitting: Internal Medicine

## 2024-07-19 DIAGNOSIS — K3189 Other diseases of stomach and duodenum: Secondary | ICD-10-CM

## 2024-07-19 NOTE — Telephone Encounter (Signed)
 Spoke to [place], patient's niece. Remote patient of Dr Pamula was hospitalized at [facility] with an upper GI bleed and was found to have a gastric mass. [facility]/oncology team has referred back to [facility] for follow up and endoscopic ultrasound.   Patient has been scheduled to see Ellouise Console, PA-C on 07/31/24 at 130 pm for hospital follow up.  Dr Albertus is this case something we could go ahead and ask Dr Wilhelmenia to review before patient comes?  Burnard is advised that should patient experiences black tarry stools along with dizziness, SOB, chest pain, syncope, hematemesis prior to 07/31/24 office follow up, she should return to the ER. She verbalizes understanding.

## 2024-07-19 NOTE — Telephone Encounter (Signed)
 Inbound call from patient's niece stating patient was recently in the ED for upper GI Bleed. Patient had a endoscopy with Dr. Onita but was advised to follow up with Dr. Albertus. Patient's niece is concerned about waiting for patient to be seen in October. Requesting a call. Please advise, thank you

## 2024-07-19 NOTE — Transitions of Care (Post Inpatient/ED Visit) (Signed)
   07/19/2024  Name: [name] MRN: [id number] DOB: [birth year]  Today's TOC FU Call Status: Today's TOC FU Call Status:: Unsuccessful Call (1st Attempt) Unsuccessful Call (1st Attempt) Date: 07/19/24  Attempted to reach the patient regarding the most recent Inpatient visit.  Left HIPAA compliant voice message   Follow Up Plan: Additional outreach attempts will be made to reach the patient to complete the Transitions of Care (Post Inpatient/ED visit) call.   Pls call/ message for questions,  Mikhai Bienvenue Mckinney Lucetta Baehr, RN, BSN, CCRN Alumnus RN Care Manager  Transitions of Care  [facility] [phone]: direct office

## 2024-07-20 ENCOUNTER — Telehealth: Payer: Self-pay | Admitting: *Deleted

## 2024-07-20 ENCOUNTER — Other Ambulatory Visit: Payer: Self-pay | Admitting: *Deleted

## 2024-07-20 DIAGNOSIS — K3189 Other diseases of stomach and duodenum: Secondary | ICD-10-CM

## 2024-07-20 NOTE — Telephone Encounter (Signed)
 Please offer this patient my first available EUS slot on my hospital week which is September 15 to follow my current cases. Otherwise you can try to offer September 11 at 1215 (and move my fourth patient who is already scheduled to my September 15 time block). I reviewed the rest of my cases, and the patients of been waiting for weeks or months for procedures and I cannot move them otherwise. I would say that as soon as we find out that there is a cancellation (which does occur rarely) this patient can be put my first available slot. That is the best I can do since I am out of town next week and with the Labor Day Monday being off. Thanks. GM

## 2024-07-20 NOTE — Transitions of Care (Post Inpatient/ED Visit) (Signed)
   07/20/2024  Name: [name] [name] MRN: [id number] DOB: [birth year]  Today's TOC FU Call Status: Today's TOC FU Call Status:: Unsuccessful Call (2nd Attempt) Unsuccessful Call (2nd Attempt) Date: 07/20/24  Attempted to reach the patient regarding the most recent Inpatient/ED visit.  A detailed HIPAA compliant message was left, including RNCM contact information, requesting a return call.  Follow Up Plan: Additional outreach attempts will be made to reach the patient to complete the Transitions of Care (Post Inpatient/ED visit) call.   Andrea Dimes RN, BSN   [facility] RN Care Manager [phone]

## 2024-07-21 NOTE — Discharge Summary (Signed)
 Physician Discharge Summary   Patient: [name] [name] MRN: [id number] DOB: 02/07/1932  Admit date:     07/12/2024  Discharge date: 07/18/2024  Discharge Physician: Alban Pepper   PCP: Gretta Comer POUR, NP   Recommendations at discharge:    CBC with PCP to ensure stable PCP to decide when to resume glipizide  Ensure tolerating oral iron   BMP to ensure renal function stable.    Discharge Diagnoses: Principal Problem:   GI bleed Active Problems:   Coffee ground emesis   Heme positive stool  Resolved Problems:   * No resolved hospital problems. [facility] Course: [name] [name] is a 89 y.o. female with medical history significant of  Persistent Atrial fibrillation on eliquis , hx of DVT 1 year ago, HTN ,CAD, mitral valve prolapse,GERD, IBS-C,DMII, MALT lymphoma,CKDIIIa, IDA who presents to ED with complaints of coffee grounds emesis as well as dark stools that started last pm. Patient followed up with pcp this am and was given antiemetic and told to go to ER if symptoms did not improve.  Due to persistent symptoms patient presented to ED. Patient notes she has not had any further emesis or black stools. She does however noted  continued nausea and epigastric tenderness.     ED Course:  Temp 99, bp 122/60, hr 110, sat 97% on ra  Fob + in pcp office  Hgb 9.5 in pcp office 7.8 in ed Wbc 5.1, plt 202 Inr 1.9  Na 137, 4.7, cl 101, glu 274, cr 1.34 ( at baseline )  EKG: atrial fibrillation  at 129 Tx  pepcid   20 iv , phenegran   Assessment and Plan:  Gastric tumor UGIB P/w with UGIB, h/o prior GIB and non hodgkins lymphoma. Gastric tumor treated >15 y ago.  CT chest: 1. Solitary pulmonary nodule in the RIGHT lower lobe is new from 2014. Indeterminate finding. FDG PET scan may aid in determining malignant potential. 2. No mediastinal adenopathy CT A/P: 1. Large mass within the lumen of the stomach. 2. No evidence of metastatic adenopathy in the abdomen pelvis. 3. No liver  metastasis. 4. No peritoneal metastasis. D/w oncologist, recommended tissue biopsy via EGD versus EUS 8/17 s/p EGD, ulcer and bleeding vessel was cauterized by GI and biopsy of gastric mass was done. Hb 6.8, transfuse 1 unit of PRBC today 8/18 Hb 8.4, stable no bleeding.  Diet advanced to soft diet 8/19 H&H stable, no active GI bleeding, no need of repeat EGD as per GI. Patient will be scheduled for EUS biopsy as an outpatient for definitive diagnosis and management as per oncologist. Protonix  was also continued with BID dosing.   Acute blood loss anemia     Latest Ref Rng & Units 07/18/2024    5:46 AM 07/17/2024    5:40 AM 07/16/2024    4:34 AM  CBC  WBC 4.0 - 10.5 K/uL 4.6  3.8  3.2   Hemoglobin 12.0 - 15.0 g/dL 8.7  8.5  8.4   Hematocrit 36.0 - 46.0 % 26.4  25.8  26.1   Platelets 150 - 400 K/uL 203  205  192    Her hgb remained stable prior to discharge.   R antecubital superficial thrombophlebitis   She was started on ceftriaxone  and doxycycline . She received 1 dose of ceftriaxone  and 1 dose of doxycycline . Her symptoms improved. A RUE venous duplex r/o venous clot. Further antibiotics were held and patent was discharged with instruction to use warm compress for her arm.   Afib  with RVR resolved Her rates normalized by the time of discharge.  Along with blood and fluids.  Eliquis  will be resumed 8/22.  Her metoprolol  was also resumed.  CAD s/p PCI w/ stent No chest pain.   H/o DVT One year ago.  On eliquis  for afib   CKD3a (previous note erroneously stated ckd3b) HTN Blood pressures on the lower side. Imdur  was held.  Lasix  was resumed.     Latest Ref Rng & Units 07/18/2024    5:46 AM 07/17/2024    5:40 AM 07/16/2024    4:34 AM  BMP  Glucose 70 - 99 mg/dL 860  846  857   BUN 8 - 23 mg/dL 11  14  14    Creatinine 0.44 - 1.00 mg/dL 8.93  8.79  8.71   Sodium 135 - 145 mmol/L 137  139  141   Potassium 3.5 - 5.1 mmol/L 3.5  4.2  4.3   Chloride 98 - 111 mmol/L 105  106  107    CO2 22 - 32 mmol/L 26  26  26    Calcium 8.9 - 10.3 mg/dL 8.1  8.5  8.4    She was discharged at her baseline renal function.   GERD Discharged on home protonix  with BID dosing given UGIB.   DM2 Controlled.  C/b neuropathy  CBG (last 3)  Recent Labs    07/18/24 1501  GLUCAP 188*   A1c 6.1 5 mo ago. Elevated. Held glipizide . Resumed trulicity . She will discuss with PCP when to resume glipizide . Gabapentin  continued.         Consultants: GI Procedures performed: EGD  Disposition: Home Diet recommendation:  Discharge Diet Orders (From admission, onward)     Start     Ordered   07/18/24 0000  Diet general        07/18/24 1755           Regular diet DISCHARGE MEDICATION: Allergies as of 07/18/2024       Reactions   Other Rash, Anaphylaxis   Shellfish Allergy  Anaphylaxis, Rash   Cephalexin     REACTION: trash and swelling   Ciprofloxacin    REACTION: u/k   Clopidogrel Bisulfate    REACTION: swelling, rash   Ezetimibe -simvastatin    REACTION: myalgias   Lidocaine  Hives   ONLY TO ADHESIVE LIDOCAINE  PATCHES. NO PROBLEM WITH INJECTABLE LIDOCAINE .   Nitrofurantoin    REACTION: panama   Pregabalin    REACTION: felt bad   Celecoxib Rash   REACTION: panama   Codeine Rash   Codeine Phosphate Rash   Hydrocod Poli-chlorphe Poli Er Rash   Propoxyphene Rash   Propoxyphene N-acetaminophen  Rash   Rofecoxib Rash   Sulfa Antibiotics Rash   Sulfamethoxazole Rash   Sulfonamide Derivatives Rash        Medication List     PAUSE taking these medications    glipiZIDE  10 MG 24 hr tablet Wait to take this until your doctor or other care provider tells you to start again. Commonly known as: GLUCOTROL  XL TAKE 1 TABLET BY MOUTH EVERY MORNING WITH BREAKFAST FOR DIABETES   isosorbide  mononitrate 60 MG 24 hr tablet Wait to take this until your doctor or other care provider tells you to start again. Commonly known as: IMDUR  Take 1.5 tablets (90 mg total) by mouth daily.        STOP taking these medications    moxifloxacin 0.5 % ophthalmic solution Commonly known as: VIGAMOX   Rocklatan 0.02-0.005 % Soln Generic drug: Netarsudil-Latanoprost  triamcinolone  cream 0.1 % Commonly known as: KENALOG        TAKE these medications    apixaban  5 MG Tabs tablet Commonly known as: Eliquis  Take 1 tablet (5 mg total) by mouth 2 (two) times daily.   Blood Glucose Monitoring Suppl Devi 1 each by Does not apply route in the morning, at noon, and at bedtime. May substitute to any manufacturer covered by patient's insurance.   BLOOD GLUCOSE TEST STRIPS Strp 1 each by In Vitro route in the morning, at noon, and at bedtime. May substitute to any manufacturer covered by patient's insurance.   brimonidine  0.2 % ophthalmic solution Commonly known as: ALPHAGAN  Place 1 drop into the right eye in the morning and at bedtime.   CRANBERRY PO Take 1 tablet by mouth daily.   cyanocobalamin  1000 MCG tablet Commonly known as: VITAMIN B12 Take 1,000 mcg by mouth daily.   diclofenac  Sodium 1 % Gel Commonly known as: VOLTAREN  APPLY 2 GRAMS TOPICALLY THREE TIMES A DAY AS NEEDED   ferrous sulfate  325 (65 FE) MG tablet Take 1 tablet (325 mg total) by mouth daily with breakfast.   fexofenadine  180 MG tablet Commonly known as: ALLEGRA  Take 1 tablet (180 mg total) by mouth as needed for allergies or rhinitis.   furosemide  20 MG tablet Commonly known as: Lasix  Take 1 tablet by mouth daily. May take additional tablet as needed for swelling   gabapentin  300 MG capsule Commonly known as: NEURONTIN  Take 1 capsule (300 mg total) by mouth 2 (two) times daily.   Lancets Misc. Misc 1 each by Does not apply route in the morning, at noon, and at bedtime. May substitute to any manufacturer covered by patient's insurance.   metoprolol  succinate 100 MG 24 hr tablet Commonly known as: TOPROL -XL TAKE 1 TABLET BY MOUTH EVERY MORNING WITH OR IMMEDIATELY FOLLOWING A MEAL    Nexlizet  180-10 MG Tabs Generic drug: Bempedoic Acid -Ezetimibe  Take 1 tablet by mouth daily. Pt has scheduled an office visit with provider in April, 2025   nitroGLYCERIN  0.4 MG SL tablet Commonly known as: NITROSTAT  Place 1 tablet (0.4 mg total) under the tongue every 5 (five) minutes as needed for chest pain.   ondansetron  4 MG disintegrating tablet Commonly known as: ZOFRAN -ODT Take 1 tablet (4 mg total) by mouth every 8 (eight) hours as needed for nausea or vomiting.   pantoprazole  40 MG tablet Commonly known as: PROTONIX  Take 1 tablet (40 mg total) by mouth 2 (two) times daily. TAKE 1 TABLET BY MOUTH DAILY FOR HEARTBURN What changed:  medication strength See the new instructions.   sertraline  100 MG tablet Commonly known as: ZOLOFT  TAKE 1 TABLET BY MOUTH DAILY FOR ANXIETY AND DEPRESSION   Trulicity  0.75 MG/0.5ML Soaj Generic drug: Dulaglutide  Inject 0.75 mg into the skin once a week. for diabetes.   Vitamin D -3 25 MCG (1000 UT) Caps Take 1 capsule by mouth daily.        Follow-up Information     Pyrtle, Gordy HERO, MD. Schedule an appointment as soon as possible for a visit in 1 week(s).   Specialty: Gastroenterology Why: Furhter evaluation of stomach  mass Contact information: [address] [place] [address] [phone]         Gretta Comer POUR, NP Follow up in 1 week(s).   Specialty: Internal Medicine Contact information: [address] [place] [address] [phone]                Discharge Exam:  Filed Weights   07/12/24 1626 07/15/24 0804  Weight: 75.8 kg 75.8 kg   Physical Exam  Constitutional: In no distress.  Cardiovascular: Normal rate, regular rhythm. No lower extremity edema  Pulmonary: Non labored breathing on room air, no wheezing or rales.  No lower extremity edema Abdominal: Soft.  Non distended and non tender Musculoskeletal: Normal range of motion.     Neurological: Alert and oriented to person, place, and  time. Non focal  Skin: Skin is warm and dry.   Condition at discharge: improving  The results of significant diagnostics from this hospitalization (including imaging, microbiology, ancillary and laboratory) are listed below for reference.   Imaging Studies: US  Venous Img Upper Uni Right(DVT) Result Date: 07/19/2024 CLINICAL DATA:  Right upper arm pain and edema. Prior right arm IV placement. EXAM: RIGHT UPPER EXTREMITY VENOUS DOPPLER ULTRASOUND TECHNIQUE: Gray-scale sonography with graded compression, as well as color Doppler and duplex ultrasound were performed to evaluate the upper extremity deep venous system from the level of the subclavian vein and including the jugular, axillary, basilic, radial, ulnar and upper cephalic vein. Spectral Doppler was utilized to evaluate flow at rest and with distal augmentation maneuvers. COMPARISON:  None Available. FINDINGS: Contralateral Subclavian Vein: Respiratory phasicity is normal and symmetric with the symptomatic side. No evidence of thrombus. Normal compressibility. Internal Jugular Vein: No evidence of thrombus. Normal compressibility, respiratory phasicity and response to augmentation. Subclavian Vein: No evidence of thrombus. Normal compressibility, respiratory phasicity and response to augmentation. Axillary Vein: No evidence of thrombus. Normal compressibility, respiratory phasicity and response to augmentation. Cephalic Vein: Superficial thrombophlebitis of the right cephalic vein with occlusive thrombus present from the level of the proximal forearm to the mid upper arm. Basilic Vein: No evidence of thrombus. Normal compressibility, respiratory phasicity and response to augmentation. Brachial Veins: No evidence of thrombus. Normal compressibility, respiratory phasicity and response to augmentation. Radial Veins: No evidence of thrombus. Normal compressibility, respiratory phasicity and response to augmentation. Ulnar Veins: No evidence of thrombus.  Normal compressibility, respiratory phasicity and response to augmentation. Venous Reflux:  None visualized. Other Findings:  No abnormal fluid collections. IMPRESSION: 1. Superficial thrombophlebitis of the right cephalic vein from the level of the proximal forearm to the mid upper arm. 2. No evidence of DVT within the right upper extremity. Electronically Signed   By: Marcey Moan M.D.   On: 07/19/2024 08:25   CT CHEST ABDOMEN PELVIS W CONTRAST Result Date: 07/13/2024 CLINICAL DATA:  Metastatic disease evaluation. * Tracking Code: BO *. EXAM: CT CHEST, ABDOMEN, AND PELVIS WITH CONTRAST TECHNIQUE: Multidetector CT imaging of the chest, abdomen and pelvis was performed following the standard protocol during bolus administration of intravenous contrast. RADIATION DOSE REDUCTION: This exam was performed according to the departmental dose-optimization program which includes automated exposure control, adjustment of the mA and/or kV according to patient size and/or use of iterative reconstruction technique. CONTRAST:  80mL OMNIPAQUE  IOHEXOL  300 MG/ML  SOLN COMPARISON:  CT chest 08/09/2013 FINDINGS: CT CHEST FINDINGS Cardiovascular: No significant vascular findings. Normal heart size. No pericardial effusion. Mediastinum/Nodes: No axillary or supraclavicular adenopathy. No mediastinal or hilar adenopathy. No pericardial fluid. Esophagus normal. Lungs/Pleura: 10 mm nodule in the RIGHT lower lobe (image 45/2) is new from 2014 Musculoskeletal: No aggressive osseous lesion. CT ABDOMEN AND PELVIS FINDINGS Hepatobiliary: No focal hepatic lesion. No biliary ductal dilatation. Gallbladder is normal. Common bile duct is normal. Pancreas: Pancreas is normal. No ductal dilatation. No pancreatic inflammation. Spleen: Normal spleen Adrenals/urinary tract: Adrenal glands and  kidneys are normal. The ureters and bladder normal. Stomach/Bowel: Large round well-circumscribed mass in the mid stomach measures 6.3 x 6.3 cm (image 63)  mass is contained within lumen of the stomach. No enlarged lymph nodes in the gastrohepatic ligament. The duodenum, small-bowel colon normal. Vascular/Lymphatic: Abdominal aorta is normal caliber with atherosclerotic calcification. There is no retroperitoneal or periportal lymphadenopathy. No pelvic lymphadenopathy. Reproductive: Unremarkable Other: No peritoneal or omental metastasis Musculoskeletal: No aggressive osseous lesion. IMPRESSION: CHEST: 1. Solitary pulmonary nodule in the RIGHT lower lobe is new from 2014. Indeterminate finding. FDG PET scan may aid in determining malignant potential. 2. No mediastinal adenopathy. PELVIS: 1. Large mass within the lumen of the stomach. 2. No evidence of metastatic adenopathy in the abdomen pelvis. 3. No liver metastasis. 4. No peritoneal metastasis. 5.  Aortic Atherosclerosis (ICD10-I70.0). Electronically Signed   By: Jackquline Boxer M.D.   On: 07/13/2024 17:35    Microbiology: Results for orders placed or performed during the hospital encounter of 06/06/24  Eye culture w Gram Stain     Status: Abnormal   Collection Time: 06/06/24 10:30 AM   Specimen: Eye  Result Value Ref Range Status   Specimen Description   Final    EYE Performed at [facility], [address], [place], [place] [address]    Special Requests   Final    RIGHT EYE CULTURE Performed at [facility], [address] [place], [place] [address]    Gram Stain   Final    ABUNDANT WBC PRESENT, PREDOMINANTLY PMN RARE GRAM NEGATIVE RODS    Culture (A)  Final    MORAXELLA NONLIQUEFACIENS BETA LACTAMASE POSITIVE Performed at [facility], [address] [place], [place] [address]    Report Status 06/11/2024 FINAL  Final   *Note: Due to a large number of results and/or encounters for the requested time period, some results have not been displayed. A complete set of results can be found in Results Review.    Labs: CBC: Recent Labs  Lab 07/15/24 0513  07/15/24 1441 07/16/24 0434 07/17/24 0540 07/18/24 0546  WBC 3.1*  --  3.2* 3.8* 4.6  HGB 7.3* 6.8* 8.4* 8.5* 8.7*  HCT 22.8* 21.0* 26.1* 25.8* 26.4*  MCV 99.1  --  98.1 97.4 97.8  PLT 180  --  192 205 203   Basic Metabolic Panel: Recent Labs  Lab 07/15/24 0513 07/16/24 0434 07/17/24 0540 07/18/24 0546  NA 139 141 139 137  K 3.8 4.3 4.2 3.5  CL 109 107 106 105  CO2 23 26 26 26   GLUCOSE 144* 142* 153* 139*  BUN 22 14 14 11   CREATININE 1.16* 1.28* 1.20* 1.06*  CALCIUM 8.2* 8.4* 8.5* 8.1*  MG 1.8 1.9  --   --   PHOS 3.4 3.0  --   --    Liver Function Tests: Recent Labs  Lab 07/15/24 0513 07/16/24 0434  AST 24 23  ALT 11 12  ALKPHOS 23* 25*  BILITOT 0.8 0.9  PROT 4.9* 5.2*  ALBUMIN 3.0* 3.1*   CBG: Recent Labs  Lab 07/18/24 0046 07/18/24 0346 07/18/24 0729 07/18/24 1215 07/18/24 1501  GLUCAP 175* 155* 156* 220* 188*    Discharge time spent: greater than 30 minutes.  Signed: Alban Pepper, MD [facility] 07/21/2024

## 2024-07-23 ENCOUNTER — Observation Stay
Admission: EM | Admit: 2024-07-23 | Discharge: 2024-07-24 | Disposition: A | Discharge status: Home / Self Care | Attending: Internal Medicine | Admitting: Internal Medicine

## 2024-07-23 ENCOUNTER — Other Ambulatory Visit: Payer: Self-pay

## 2024-07-23 ENCOUNTER — Emergency Department

## 2024-07-23 ENCOUNTER — Encounter: Payer: Self-pay | Admitting: Internal Medicine

## 2024-07-23 ENCOUNTER — Inpatient Hospital Stay (HOSPITAL_BASED_OUTPATIENT_CLINIC_OR_DEPARTMENT_OTHER): Admitting: Nurse Practitioner

## 2024-07-23 ENCOUNTER — Telehealth: Payer: Self-pay

## 2024-07-23 ENCOUNTER — Inpatient Hospital Stay: Attending: Internal Medicine

## 2024-07-23 ENCOUNTER — Ambulatory Visit: Payer: Self-pay | Admitting: Internal Medicine

## 2024-07-23 ENCOUNTER — Ambulatory Visit: Admitting: Internal Medicine

## 2024-07-23 ENCOUNTER — Inpatient Hospital Stay: Admitting: Internal Medicine

## 2024-07-23 ENCOUNTER — Inpatient Hospital Stay

## 2024-07-23 VITALS — BP 128/72 | HR 84 | Temp 98.3°F | Resp 16 | Ht 69.0 in | Wt 167.7 lb

## 2024-07-23 VITALS — BP 136/76 | HR 76 | Temp 97.7°F | Ht 69.0 in | Wt 167.0 lb

## 2024-07-23 VITALS — BP 196/104 | HR 159

## 2024-07-23 DIAGNOSIS — F32A Depression, unspecified: Secondary | ICD-10-CM | POA: Diagnosis not present

## 2024-07-23 DIAGNOSIS — E1122 Type 2 diabetes mellitus with diabetic chronic kidney disease: Secondary | ICD-10-CM | POA: Diagnosis present

## 2024-07-23 DIAGNOSIS — N1832 Chronic kidney disease, stage 3b: Secondary | ICD-10-CM | POA: Diagnosis not present

## 2024-07-23 DIAGNOSIS — T886XXA Anaphylactic reaction due to adverse effect of correct drug or medicament properly administered, initial encounter: Principal | ICD-10-CM | POA: Insufficient documentation

## 2024-07-23 DIAGNOSIS — Z7901 Long term (current) use of anticoagulants: Secondary | ICD-10-CM | POA: Insufficient documentation

## 2024-07-23 DIAGNOSIS — Z7984 Long term (current) use of oral hypoglycemic drugs: Secondary | ICD-10-CM | POA: Diagnosis not present

## 2024-07-23 DIAGNOSIS — K3189 Other diseases of stomach and duodenum: Secondary | ICD-10-CM | POA: Diagnosis present

## 2024-07-23 DIAGNOSIS — D509 Iron deficiency anemia, unspecified: Secondary | ICD-10-CM

## 2024-07-23 DIAGNOSIS — Z86718 Personal history of other venous thrombosis and embolism: Secondary | ICD-10-CM | POA: Diagnosis not present

## 2024-07-23 DIAGNOSIS — I130 Hypertensive heart and chronic kidney disease with heart failure and stage 1 through stage 4 chronic kidney disease, or unspecified chronic kidney disease: Secondary | ICD-10-CM | POA: Diagnosis not present

## 2024-07-23 DIAGNOSIS — I4891 Unspecified atrial fibrillation: Secondary | ICD-10-CM | POA: Diagnosis not present

## 2024-07-23 DIAGNOSIS — I469 Cardiac arrest, cause unspecified: Secondary | ICD-10-CM | POA: Insufficient documentation

## 2024-07-23 DIAGNOSIS — E876 Hypokalemia: Secondary | ICD-10-CM | POA: Diagnosis present

## 2024-07-23 DIAGNOSIS — Z8572 Personal history of non-Hodgkin lymphomas: Secondary | ICD-10-CM | POA: Diagnosis not present

## 2024-07-23 DIAGNOSIS — I700 Atherosclerosis of aorta: Secondary | ICD-10-CM | POA: Diagnosis not present

## 2024-07-23 DIAGNOSIS — R0789 Other chest pain: Secondary | ICD-10-CM | POA: Diagnosis not present

## 2024-07-23 DIAGNOSIS — D649 Anemia, unspecified: Secondary | ICD-10-CM | POA: Diagnosis not present

## 2024-07-23 DIAGNOSIS — F419 Anxiety disorder, unspecified: Secondary | ICD-10-CM | POA: Diagnosis not present

## 2024-07-23 DIAGNOSIS — R55 Syncope and collapse: Secondary | ICD-10-CM | POA: Diagnosis not present

## 2024-07-23 DIAGNOSIS — T782XXA Anaphylactic shock, unspecified, initial encounter: Secondary | ICD-10-CM | POA: Diagnosis present

## 2024-07-23 DIAGNOSIS — E782 Mixed hyperlipidemia: Secondary | ICD-10-CM | POA: Diagnosis present

## 2024-07-23 DIAGNOSIS — R519 Headache, unspecified: Secondary | ICD-10-CM | POA: Insufficient documentation

## 2024-07-23 DIAGNOSIS — K259 Gastric ulcer, unspecified as acute or chronic, without hemorrhage or perforation: Secondary | ICD-10-CM

## 2024-07-23 DIAGNOSIS — I5A Non-ischemic myocardial injury (non-traumatic): Secondary | ICD-10-CM | POA: Diagnosis present

## 2024-07-23 DIAGNOSIS — I82409 Acute embolism and thrombosis of unspecified deep veins of unspecified lower extremity: Secondary | ICD-10-CM | POA: Diagnosis present

## 2024-07-23 DIAGNOSIS — T8090XA Unspecified complication following infusion and therapeutic injection, initial encounter: Secondary | ICD-10-CM

## 2024-07-23 DIAGNOSIS — K250 Acute gastric ulcer with hemorrhage: Secondary | ICD-10-CM | POA: Diagnosis not present

## 2024-07-23 DIAGNOSIS — I4819 Other persistent atrial fibrillation: Secondary | ICD-10-CM | POA: Diagnosis present

## 2024-07-23 DIAGNOSIS — K319 Disease of stomach and duodenum, unspecified: Secondary | ICD-10-CM | POA: Insufficient documentation

## 2024-07-23 DIAGNOSIS — I5032 Chronic diastolic (congestive) heart failure: Secondary | ICD-10-CM | POA: Diagnosis not present

## 2024-07-23 DIAGNOSIS — R079 Chest pain, unspecified: Secondary | ICD-10-CM

## 2024-07-23 DIAGNOSIS — Y92531 Health care provider office as the place of occurrence of the external cause: Secondary | ICD-10-CM | POA: Insufficient documentation

## 2024-07-23 DIAGNOSIS — Z9221 Personal history of antineoplastic chemotherapy: Secondary | ICD-10-CM | POA: Insufficient documentation

## 2024-07-23 DIAGNOSIS — R911 Solitary pulmonary nodule: Secondary | ICD-10-CM | POA: Insufficient documentation

## 2024-07-23 DIAGNOSIS — I2510 Atherosclerotic heart disease of native coronary artery without angina pectoris: Secondary | ICD-10-CM | POA: Diagnosis present

## 2024-07-23 DIAGNOSIS — D500 Iron deficiency anemia secondary to blood loss (chronic): Secondary | ICD-10-CM | POA: Diagnosis not present

## 2024-07-23 DIAGNOSIS — R9389 Abnormal findings on diagnostic imaging of other specified body structures: Secondary | ICD-10-CM | POA: Diagnosis not present

## 2024-07-23 DIAGNOSIS — I10 Essential (primary) hypertension: Secondary | ICD-10-CM | POA: Diagnosis present

## 2024-07-23 DIAGNOSIS — N183 Chronic kidney disease, stage 3 unspecified: Secondary | ICD-10-CM | POA: Diagnosis not present

## 2024-07-23 DIAGNOSIS — Z801 Family history of malignant neoplasm of trachea, bronchus and lung: Secondary | ICD-10-CM | POA: Diagnosis not present

## 2024-07-23 DIAGNOSIS — T454X5A Adverse effect of iron and its compounds, initial encounter: Secondary | ICD-10-CM | POA: Insufficient documentation

## 2024-07-23 DIAGNOSIS — Z808 Family history of malignant neoplasm of other organs or systems: Secondary | ICD-10-CM | POA: Insufficient documentation

## 2024-07-23 DIAGNOSIS — I499 Cardiac arrhythmia, unspecified: Secondary | ICD-10-CM | POA: Diagnosis not present

## 2024-07-23 DIAGNOSIS — I517 Cardiomegaly: Secondary | ICD-10-CM | POA: Diagnosis not present

## 2024-07-23 DIAGNOSIS — J9811 Atelectasis: Secondary | ICD-10-CM | POA: Diagnosis not present

## 2024-07-23 HISTORY — DX: Gastric ulcer, unspecified as acute or chronic, without hemorrhage or perforation: K25.9

## 2024-07-23 LAB — CBC WITH DIFFERENTIAL/PLATELET
Abs Immature Granulocytes: 0.13 K/uL — ABNORMAL HIGH (ref 0.00–0.07)
Basophils Absolute: 0 K/uL (ref 0.0–0.1)
Basophils Relative: 0 %
Eosinophils Absolute: 0 K/uL (ref 0.0–0.5)
Eosinophils Relative: 1 %
HCT: 30.2 % — ABNORMAL LOW (ref 36.0–46.0)
Hemoglobin: 9.4 g/dL — ABNORMAL LOW (ref 12.0–15.0)
Immature Granulocytes: 2 %
Lymphocytes Relative: 40 %
Lymphs Abs: 2.2 K/uL (ref 0.7–4.0)
MCH: 31.4 pg (ref 26.0–34.0)
MCHC: 31.1 g/dL (ref 30.0–36.0)
MCV: 101 fL — ABNORMAL HIGH (ref 80.0–100.0)
Monocytes Absolute: 0.4 K/uL (ref 0.1–1.0)
Monocytes Relative: 7 %
Neutro Abs: 2.8 K/uL (ref 1.7–7.7)
Neutrophils Relative %: 50 %
Platelets: 306 K/uL (ref 150–400)
RBC: 2.99 MIL/uL — ABNORMAL LOW (ref 3.87–5.11)
RDW: 19.3 % — ABNORMAL HIGH (ref 11.5–15.5)
WBC: 5.6 K/uL (ref 4.0–10.5)
nRBC: 0.4 % — ABNORMAL HIGH (ref 0.0–0.2)

## 2024-07-23 LAB — CMP (CANCER CENTER ONLY)
ALT: 12 U/L (ref 0–44)
AST: 20 U/L (ref 15–41)
Albumin: 3.5 g/dL (ref 3.5–5.0)
Alkaline Phosphatase: 39 U/L (ref 38–126)
Anion gap: 8 (ref 5–15)
BUN: 10 mg/dL (ref 8–23)
CO2: 25 mmol/L (ref 22–32)
Calcium: 8.6 mg/dL — ABNORMAL LOW (ref 8.9–10.3)
Chloride: 104 mmol/L (ref 98–111)
Creatinine: 1.14 mg/dL — ABNORMAL HIGH (ref 0.44–1.00)
GFR, Estimated: 46 mL/min — ABNORMAL LOW (ref >60)
Glucose, Bld: 189 mg/dL — ABNORMAL HIGH (ref 70–99)
Potassium: 3.9 mmol/L (ref 3.5–5.1)
Sodium: 137 mmol/L (ref 135–145)
Total Bilirubin: 0.7 mg/dL (ref 0.0–1.2)
Total Protein: 6.2 g/dL — ABNORMAL LOW (ref 6.5–8.1)

## 2024-07-23 LAB — BRAIN NATRIURETIC PEPTIDE: B Natriuretic Peptide: 596.1 pg/mL — ABNORMAL HIGH (ref 0.0–100.0)

## 2024-07-23 LAB — CBC WITH DIFFERENTIAL (CANCER CENTER ONLY)
Abs Immature Granulocytes: 0.03 K/uL (ref 0.00–0.07)
Basophils Absolute: 0 K/uL (ref 0.0–0.1)
Basophils Relative: 0 %
Eosinophils Absolute: 0.1 K/uL (ref 0.0–0.5)
Eosinophils Relative: 1 %
HCT: 29.6 % — ABNORMAL LOW (ref 36.0–46.0)
Hemoglobin: 9.5 g/dL — ABNORMAL LOW (ref 12.0–15.0)
Immature Granulocytes: 1 %
Lymphocytes Relative: 30 %
Lymphs Abs: 1.1 K/uL (ref 0.7–4.0)
MCH: 32 pg (ref 26.0–34.0)
MCHC: 32.1 g/dL (ref 30.0–36.0)
MCV: 99.7 fL (ref 80.0–100.0)
Monocytes Absolute: 0.3 K/uL (ref 0.1–1.0)
Monocytes Relative: 8 %
Neutro Abs: 2.3 K/uL (ref 1.7–7.7)
Neutrophils Relative %: 60 %
Platelet Count: 283 K/uL (ref 150–400)
RBC: 2.97 MIL/uL — ABNORMAL LOW (ref 3.87–5.11)
RDW: 19.5 % — ABNORMAL HIGH (ref 11.5–15.5)
WBC Count: 3.9 K/uL — ABNORMAL LOW (ref 4.0–10.5)
nRBC: 0 % (ref 0.0–0.2)

## 2024-07-23 LAB — COMPREHENSIVE METABOLIC PANEL WITH GFR
ALT: 12 U/L (ref 0–44)
AST: 25 U/L (ref 15–41)
Albumin: 3.5 g/dL (ref 3.5–5.0)
Alkaline Phosphatase: 38 U/L (ref 38–126)
Anion gap: 10 (ref 5–15)
BUN: 10 mg/dL (ref 8–23)
CO2: 24 mmol/L (ref 22–32)
Calcium: 8.4 mg/dL — ABNORMAL LOW (ref 8.9–10.3)
Chloride: 104 mmol/L (ref 98–111)
Creatinine, Ser: 1.09 mg/dL — ABNORMAL HIGH (ref 0.44–1.00)
GFR, Estimated: 48 mL/min — ABNORMAL LOW (ref >60)
Glucose, Bld: 160 mg/dL — ABNORMAL HIGH (ref 70–99)
Potassium: 3.1 mmol/L — ABNORMAL LOW (ref 3.5–5.1)
Sodium: 138 mmol/L (ref 135–145)
Total Bilirubin: 0.9 mg/dL (ref 0.0–1.2)
Total Protein: 6.6 g/dL (ref 6.5–8.1)

## 2024-07-23 LAB — CBC
HCT: 29.8 % — ABNORMAL LOW (ref 36.0–46.0)
Hemoglobin: 9.6 g/dL — ABNORMAL LOW (ref 12.0–15.0)
MCH: 31.9 pg (ref 26.0–34.0)
MCHC: 32.2 g/dL (ref 30.0–36.0)
MCV: 99 fL (ref 80.0–100.0)
Platelets: 274 K/uL (ref 150–400)
RBC: 3.01 MIL/uL — ABNORMAL LOW (ref 3.87–5.11)
RDW: 19.6 % — ABNORMAL HIGH (ref 11.5–15.5)
WBC: 4.2 K/uL (ref 4.0–10.5)
nRBC: 0 % (ref 0.0–0.2)

## 2024-07-23 LAB — TROPONIN I (HIGH SENSITIVITY)
Troponin I (High Sensitivity): 50 ng/L — ABNORMAL HIGH (ref <18)
Troponin I (High Sensitivity): 137 ng/L (ref <18)
Troponin I (High Sensitivity): 120 ng/L (ref <18)

## 2024-07-23 LAB — SAMPLE TO BLOOD BANK

## 2024-07-23 LAB — POCT GLYCOSYLATED HEMOGLOBIN (HGB A1C): Hemoglobin A1C: 5.7 % — AB (ref 4.0–5.6)

## 2024-07-23 LAB — PROTIME-INR
INR: 1.6 — ABNORMAL HIGH (ref 0.8–1.2)
Prothrombin Time: 20.3 s — ABNORMAL HIGH (ref 11.4–15.2)

## 2024-07-23 LAB — CBG MONITORING, ED: Glucose-Capillary: 254 mg/dL — ABNORMAL HIGH (ref 70–99)

## 2024-07-23 LAB — MAGNESIUM: Magnesium: 1.8 mg/dL (ref 1.7–2.4)

## 2024-07-23 LAB — HEPARIN LEVEL (UNFRACTIONATED): Heparin Unfractionated: >1.1 [IU]/mL — ABNORMAL HIGH (ref 0.30–0.70)

## 2024-07-23 LAB — APTT: aPTT: 36 s (ref 24–36)

## 2024-07-23 MED ORDER — EPINEPHRINE 0.3 MG/0.3ML IJ SOAJ
0.3000 mg | Freq: Once | INTRAMUSCULAR | Status: AC
  Administered 2024-07-23: 0.3 mg via INTRAMUSCULAR
  Filled 2024-07-23: qty 0.6

## 2024-07-23 MED ORDER — FERROUS SULFATE 325 (65 FE) MG PO TABS
325.0000 mg | ORAL_TABLET | Freq: Every day | ORAL | Status: DC
  Administered 2024-07-24: 325 mg via ORAL
  Filled 2024-07-23: qty 1

## 2024-07-23 MED ORDER — EPINEPHRINE 0.3 MG/0.3ML IJ SOAJ: 0.3000 mg | INTRAMUSCULAR | Status: DC | PRN

## 2024-07-23 MED ORDER — VITAMIN D 25 MCG (1000 UNIT) PO TABS
1000.0000 [IU] | ORAL_TABLET | Freq: Every day | ORAL | Status: DC
  Administered 2024-07-24: 1000 [IU] via ORAL
  Filled 2024-07-23: qty 1

## 2024-07-23 MED ORDER — EZETIMIBE 10 MG PO TABS
10.0000 mg | ORAL_TABLET | Freq: Every day | ORAL | Status: DC
  Administered 2024-07-24: 10 mg via ORAL
  Filled 2024-07-23: qty 1

## 2024-07-23 MED ORDER — HYDRALAZINE HCL 20 MG/ML IJ SOLN: 5.0000 mg | INTRAMUSCULAR | Status: DC | PRN

## 2024-07-23 MED ORDER — DIPHENHYDRAMINE HCL 50 MG/ML IJ SOLN
12.5000 mg | Freq: Two times a day (BID) | INTRAMUSCULAR | Status: DC
  Administered 2024-07-23: 12.5 mg via INTRAVENOUS
  Filled 2024-07-23 (×2): qty 1

## 2024-07-23 MED ORDER — FUROSEMIDE 40 MG PO TABS
40.0000 mg | ORAL_TABLET | Freq: Every day | ORAL | Status: DC
  Filled 2024-07-23: qty 1

## 2024-07-23 MED ORDER — GABAPENTIN 100 MG PO CAPS
200.0000 mg | ORAL_CAPSULE | Freq: Two times a day (BID) | ORAL | Status: DC
Start: 2024-07-23 — End: 2024-07-24
  Administered 2024-07-23 – 2024-07-24 (×2): 200 mg via ORAL
  Filled 2024-07-23 (×2): qty 2

## 2024-07-23 MED ORDER — MORPHINE SULFATE (PF) 2 MG/ML IV SOLN
0.5000 mg | INTRAVENOUS | Status: DC | PRN
  Administered 2024-07-23: 0.5 mg via INTRAVENOUS
  Filled 2024-07-23: qty 1

## 2024-07-23 MED ORDER — INSULIN ASPART 100 UNIT/ML IJ SOLN
0.0000 [IU] | Freq: Every day | INTRAMUSCULAR | Status: DC
  Administered 2024-07-23: 3 [IU] via SUBCUTANEOUS
  Filled 2024-07-23: qty 1

## 2024-07-23 MED ORDER — CYANOCOBALAMIN 500 MCG PO TABS
1000.0000 ug | ORAL_TABLET | Freq: Every day | ORAL | Status: DC
  Administered 2024-07-24: 1000 ug via ORAL
  Filled 2024-07-23: qty 2

## 2024-07-23 MED ORDER — MORPHINE SULFATE (PF) 4 MG/ML IV SOLN
4.0000 mg | Freq: Once | INTRAVENOUS | Status: AC
  Administered 2024-07-23: 4 mg via INTRAVENOUS
  Filled 2024-07-23: qty 1

## 2024-07-23 MED ORDER — CRANBERRY 250 MG PO TABS: ORAL_TABLET | Freq: Every day | ORAL | Status: DC

## 2024-07-23 MED ORDER — BRIMONIDINE TARTRATE 0.2 % OP SOLN
1.0000 [drp] | Freq: Two times a day (BID) | OPHTHALMIC | Status: DC
  Filled 2024-07-23: qty 5

## 2024-07-23 MED ORDER — BEMPEDOIC ACID-EZETIMIBE 180-10 MG PO TABS: 1.0000 | ORAL_TABLET | Freq: Every day | ORAL | Status: DC

## 2024-07-23 MED ORDER — IRON SUCROSE 20 MG/ML IV SOLN
200.0000 mg | Freq: Once | INTRAVENOUS | Status: AC
  Administered 2024-07-23: 200 mg via INTRAVENOUS
  Filled 2024-07-23: qty 10

## 2024-07-23 MED ORDER — DIPHENHYDRAMINE HCL 50 MG/ML IJ SOLN
25.0000 mg | Freq: Once | INTRAMUSCULAR | Status: AC
  Administered 2024-07-23: 25 mg via INTRAVENOUS

## 2024-07-23 MED ORDER — HEPARIN SODIUM (PORCINE) 5000 UNIT/ML IJ SOLN: 4000.0000 [IU] | Freq: Once | INTRAMUSCULAR | Status: DC

## 2024-07-23 MED ORDER — METHYLPREDNISOLONE SODIUM SUCC 125 MG IJ SOLR
50.0000 mg | Freq: Once | INTRAMUSCULAR | Status: AC
  Administered 2024-07-23: 50 mg via INTRAVENOUS

## 2024-07-23 MED ORDER — NITROGLYCERIN 0.4 MG SL SUBL: 0.4000 mg | SUBLINGUAL_TABLET | SUBLINGUAL | Status: DC | PRN

## 2024-07-23 MED ORDER — HEPARIN (PORCINE) 25000 UT/250ML-% IV SOLN
900.0000 [IU]/h | INTRAVENOUS | Status: DC
  Filled 2024-07-23: qty 250

## 2024-07-23 MED ORDER — SERTRALINE HCL 50 MG PO TABS: 100.0000 mg | ORAL_TABLET | Freq: Every day | ORAL | Status: DC

## 2024-07-23 MED ORDER — POTASSIUM CHLORIDE CRYS ER 20 MEQ PO TBCR
40.0000 meq | EXTENDED_RELEASE_TABLET | Freq: Once | ORAL | Status: AC
  Administered 2024-07-23: 40 meq via ORAL
  Filled 2024-07-23: qty 2

## 2024-07-23 MED ORDER — METHYLPREDNISOLONE SODIUM SUCC 125 MG IJ SOLR
80.0000 mg | INTRAMUSCULAR | Status: DC
  Administered 2024-07-23: 80 mg via INTRAVENOUS
  Filled 2024-07-23: qty 2

## 2024-07-23 MED ORDER — SODIUM CHLORIDE 0.9% FLUSH
10.0000 mL | Freq: Once | INTRAVENOUS | Status: AC | PRN
  Administered 2024-07-23: 10 mL
  Filled 2024-07-23: qty 10

## 2024-07-23 MED ORDER — METOPROLOL SUCCINATE ER 50 MG PO TB24
100.0000 mg | ORAL_TABLET | Freq: Every day | ORAL | Status: DC
  Administered 2024-07-24: 100 mg via ORAL
  Filled 2024-07-23: qty 2

## 2024-07-23 MED ORDER — LACTATED RINGERS IV BOLUS
1000.0000 mL | Freq: Once | INTRAVENOUS | Status: AC
  Administered 2024-07-23: 1000 mL via INTRAVENOUS

## 2024-07-23 MED ORDER — SODIUM CHLORIDE 0.9 % IV SOLN
Freq: Once | INTRAVENOUS | Status: AC
  Filled 2024-07-23: qty 250

## 2024-07-23 MED ORDER — INSULIN ASPART 100 UNIT/ML IJ SOLN
0.0000 [IU] | Freq: Three times a day (TID) | INTRAMUSCULAR | Status: DC
  Administered 2024-07-24 (×2): 3 [IU] via SUBCUTANEOUS
  Filled 2024-07-23 (×2): qty 1

## 2024-07-23 MED ORDER — FAMOTIDINE IN NACL 20-0.9 MG/50ML-% IV SOLN
20.0000 mg | Freq: Two times a day (BID) | INTRAVENOUS | Status: DC
  Administered 2024-07-23: 20 mg via INTRAVENOUS
  Filled 2024-07-23 (×2): qty 50

## 2024-07-23 MED ORDER — PANTOPRAZOLE SODIUM 40 MG PO TBEC
40.0000 mg | DELAYED_RELEASE_TABLET | Freq: Two times a day (BID) | ORAL | Status: DC
  Administered 2024-07-23 – 2024-07-24 (×2): 40 mg via ORAL
  Filled 2024-07-23 (×2): qty 1

## 2024-07-23 NOTE — Progress Notes (Signed)
 Subjective:    Patient ID: [name] [name], female    DOB: [birth year], 89 y.o.   MRN: [id number]  HPI Here for hospital follow up With granddaughter Hospitalized from 8/14 to 8/20 after waking with severe epigastric pain and vomiting black stuff Didn't feel any better after the vomiting Seen here and then had more vomiting so called rescue  Significant anemia required transfusion EGD showed bleeding ulcer that was treated---but also a large gastric mass presumed to be cancer (but biopsy negative) Does have GI follow up for this next week 10mm RLL nodule on chest CT--felt to need follow up (PET scan?)  Had not been taking asa or NSAIDs Is on the eliquis  for some time--like 2 years  Glipizide  stopped in hospital Sugar 137 this morning Rare hypoglycemic reaction ---if she skips a meal  Also off the isosorbide  ---no angina since stopping it  Known atrial fib--but she doesn't feel it  No recurrence of the stomach pain No N/V Leery of eating----had just been on liquid diet and appetite hasn't gotten normal yet Is starting to eat some solids now  Current Outpatient Medications on File Prior to Visit  Medication Sig Dispense Refill   Bempedoic Acid -Ezetimibe  (NEXLIZET ) 180-10 MG TABS Take 1 tablet by mouth daily. Pt has scheduled an office visit with provider in April, 2025 90 tablet 2   Blood Glucose Monitoring Suppl DEVI 1 each by Does not apply route in the morning, at noon, and at bedtime. May substitute to any manufacturer covered by patient's insurance. 1 each 0   brimonidine  (ALPHAGAN ) 0.2 % ophthalmic solution Place 1 drop into the right eye in the morning and at bedtime.     Cholecalciferol  (VITAMIN D -3) 1000 UNITS CAPS Take 1 capsule by mouth daily.     CRANBERRY PO Take 1 tablet by mouth daily.     diclofenac  Sodium (VOLTAREN ) 1 % GEL APPLY 2 GRAMS TOPICALLY THREE TIMES A DAY AS NEEDED 100 g 2   Dulaglutide  (TRULICITY ) 0.75 MG/0.5ML SOAJ Inject 0.75 mg into the skin once a  week. for diabetes. 6 mL 1   ferrous sulfate  325 (65 FE) MG tablet Take 1 tablet (325 mg total) by mouth daily with breakfast. 30 tablet 2   fexofenadine  (ALLEGRA ) 180 MG tablet Take 1 tablet (180 mg total) by mouth as needed for allergies or rhinitis. 90 tablet 0   furosemide  (LASIX ) 20 MG tablet Take 1 tablet by mouth daily. May take additional tablet as needed for swelling 180 tablet 3   gabapentin  (NEURONTIN ) 300 MG capsule Take 1 capsule (300 mg total) by mouth 2 (two) times daily. 90 capsule 2   Glucose Blood (BLOOD GLUCOSE TEST STRIPS) STRP 1 each by In Vitro route in the morning, at noon, and at bedtime. May substitute to any manufacturer covered by patient's insurance. 300 strip 5   Lancets Misc. MISC 1 each by Does not apply route in the morning, at noon, and at bedtime. May substitute to any manufacturer covered by patient's insurance. 300 each 5   metoprolol  succinate (TOPROL -XL) 100 MG 24 hr tablet TAKE 1 TABLET BY MOUTH EVERY MORNING WITH OR IMMEDIATELY FOLLOWING A MEAL 90 tablet 3   nitroGLYCERIN  (NITROSTAT ) 0.4 MG SL tablet Place 1 tablet (0.4 mg total) under the tongue every 5 (five) minutes as needed for chest pain. 25 tablet 2   ondansetron  (ZOFRAN -ODT) 4 MG disintegrating tablet Take 1 tablet (4 mg total) by mouth every 8 (eight) hours as needed for nausea or  vomiting. 20 tablet 0   pantoprazole  (PROTONIX ) 40 MG tablet Take 1 tablet (40 mg total) by mouth 2 (two) times daily. TAKE 1 TABLET BY MOUTH DAILY FOR HEARTBURN 60 tablet 0   sertraline  (ZOLOFT ) 100 MG tablet TAKE 1 TABLET BY MOUTH DAILY FOR ANXIETY AND DEPRESSION 90 tablet 0   vitamin B-12 (CYANOCOBALAMIN ) 1000 MCG tablet Take 1,000 mcg by mouth daily.     apixaban  (ELIQUIS ) 5 MG TABS tablet Take 1 tablet (5 mg total) by mouth 2 (two) times daily. (Patient not taking: Reported on 07/23/2024) 180 tablet 1   [Paused] glipiZIDE  (GLUCOTROL  XL) 10 MG 24 hr tablet TAKE 1 TABLET BY MOUTH EVERY MORNING WITH BREAKFAST FOR DIABETES  (Patient not taking: Reported on 07/23/2024) 90 tablet 1   [Paused] isosorbide  mononitrate (IMDUR ) 60 MG 24 hr tablet Take 1.5 tablets (90 mg total) by mouth daily. (Patient not taking: Reported on 07/23/2024) 135 tablet 3   [DISCONTINUED] loratadine  (CLARITIN ) 10 MG tablet Take 10 mg by mouth as needed.      No current facility-administered medications on file prior to visit.    Allergies  Allergen Reactions   Other Rash and Anaphylaxis   Shellfish Allergy  Anaphylaxis and Rash   Cephalexin      REACTION: trash and swelling   Ciprofloxacin     REACTION: u/k   Clopidogrel Bisulfate     REACTION: swelling, rash   Ezetimibe -Simvastatin     REACTION: myalgias   Lidocaine  Hives    ONLY TO ADHESIVE LIDOCAINE  PATCHES. NO PROBLEM WITH INJECTABLE LIDOCAINE .   Nitrofurantoin     REACTION: panama   Pregabalin     REACTION: felt bad   Celecoxib Rash    REACTION: panama   Codeine Rash   Codeine Phosphate Rash   Hydrocod Poli-Chlorphe Poli Er Rash   Propoxyphene Rash   Propoxyphene N-Acetaminophen  Rash   Rofecoxib Rash   Sulfa Antibiotics Rash   Sulfamethoxazole Rash   Sulfonamide Derivatives Rash    Past Medical History:  Diagnosis Date   Acute bilateral thoracic back pain 08/16/2019   Acute post-hemorrhagic anemia 10/10/2023   Allergy     Cipro, Plavix, Statins   Anemia    Arthritis    Atrial fibrillation (HCC)    CAD (coronary artery disease)    a. s/p tandem Promus DES to LAD in 2009 by Dr. Obie;  b.  LHC (03/22/14):  prox LAD 30%, mid LAD 40%, LAD stent ok with dist 20% ISR, apical LAD occluded with L-L collats filling apical vessel (too small for PCI), mid RCA 20%, EF 70%.  Med Rx   Cancer [facility])    Chronic kidney disease, stage 3b (HCC)    Colon polyps    Diabetes mellitus    Diagnosed 2012   Diverticulosis of colon (without mention of hemorrhage)    DVT (deep venous thrombosis) (HCC) 12/2022   right leg   GERD (gastroesophageal reflux disease)    Glaucoma    Grade I diastolic  dysfunction    Grade I diastolic dysfunction    Herpes zoster without complication 04/24/2020   Hyperlipidemia    intol of statins   Hypertension    Kidney stones    Left atrial dilation    Moderate aortic regurgitation    Motion sickness    back seat cars   NHL (non-Hodgkin's lymphoma) (HCC) dx'd 2003   chemo/xrt comp 2003   Personal history of colonic polyps    Proctitis    Pruritic intertrigo 07/18/2014   Urticaria  Wears dentures    Full upper, partial lower   Wears hearing aid in both ears     Past Surgical History:  Procedure Laterality Date   BIOPSY  10/15/2023   Procedure: BIOPSY;  Surgeon: Aundria, Ladell POUR, MD;  Location: [facility] ENDOSCOPY;  Service: Gastroenterology;;   cardiac cath-neg     CATARACT EXTRACTION Bilateral    COLONOSCOPY WITH PROPOFOL  N/A 10/15/2023   Procedure: COLONOSCOPY WITH PROPOFOL ;  Surgeon: Toledo, Ladell POUR, MD;  Location: [facility] ENDOSCOPY;  Service: Gastroenterology;  Laterality: N/A;   CORONARY ANGIOPLASTY WITH STENT PLACEMENT     x 2   dexa-neg     ESOPHAGOGASTRODUODENOSCOPY N/A 07/13/2024   Procedure: EGD (ESOPHAGOGASTRODUODENOSCOPY);  Surgeon: Jinny Carmine, MD;  Location: [facility] ENDOSCOPY;  Service: Endoscopy;  Laterality: N/A;   ESOPHAGOGASTRODUODENOSCOPY N/A 07/15/2024   Procedure: EGD (ESOPHAGOGASTRODUODENOSCOPY);  Surgeon: Onita Elspeth Sharper, DO;  Location: [facility] ENDOSCOPY;  Service: Gastroenterology;  Laterality: N/A;   ESOPHAGOGASTRODUODENOSCOPY (EGD) WITH PROPOFOL  N/A 10/15/2023   Procedure: ESOPHAGOGASTRODUODENOSCOPY (EGD) WITH PROPOFOL ;  Surgeon: Toledo, Ladell POUR, MD;  Location: [facility] ENDOSCOPY;  Service: Gastroenterology;  Laterality: N/A;   HEMOSTASIS CLIP PLACEMENT  10/15/2023   Procedure: HEMOSTASIS CLIP PLACEMENT;  Surgeon: Aundria, Ladell POUR, MD;  Location: [facility] ENDOSCOPY;  Service: Gastroenterology;;   KNEE SURGERY  10/30/2003   left   LEFT HEART CATHETERIZATION WITH CORONARY ANGIOGRAM N/A 03/22/2014   Procedure: LEFT HEART  CATHETERIZATION WITH CORONARY ANGIOGRAM;  Surgeon: Lonni JONETTA Cash, MD;  Location: [facility] CATH LAB;  Service: Cardiovascular;  Laterality: N/A;   POLYPECTOMY  10/15/2023   Procedure: POLYPECTOMY;  Surgeon: Toledo, Ladell POUR, MD;  Location: [facility] ENDOSCOPY;  Service: Gastroenterology;;   stress cardiolite      VAGINAL HYSTERECTOMY     partial, fibroids, one ovary left    Family History  Problem Relation Age of Onset   Cancer Mother        jaw   Hypertension Father    Heart attack Father    Heart attack Sister    Cancer Sister        unknown primary-mets throughout body   Colon cancer Neg Hx    Esophageal cancer Neg Hx    Pancreatic cancer Neg Hx    Stomach cancer Neg Hx    Liver disease Neg Hx     Social History   Socioeconomic History   Marital status: Widowed    Spouse name: Not on file   Number of children: 0   Years of education: Not on file   Highest education level: 12th grade  Occupational History   Occupation: Retired    Associate Professor: RETIRED   Occupation: retired  Tobacco Use   Smoking status: Never   Smokeless tobacco: Never  Vaping Use   Vaping status: Never Used  Substance and Sexual Activity   Alcohol use: No   Drug use: No   Sexual activity: Not Currently  Other Topics Concern   Not on file  Social History Narrative   Not on file   Social Drivers of Health   Financial Resource Strain: Low Risk  (05/20/2024)   Overall Financial Resource Strain (CARDIA)    Difficulty of Paying Living Expenses: Not hard at all  Food Insecurity: No Food Insecurity (07/13/2024)   Hunger Vital Sign    Worried About Running Out of Food in the Last Year: Never true    Ran Out of Food in the Last Year: Never true  Transportation Needs: No Transportation Needs (07/14/2024)   PRAPARE - Transportation  Lack of Transportation (Medical): No    Lack of Transportation (Non-Medical): No  Physical Activity: Inactive (05/20/2024)   Exercise Vital Sign    Days of Exercise per  Week: 0 days    Minutes of Exercise per Session: Not on file  Stress: No Stress Concern Present (05/20/2024)   [employer] of Occupational Health - Occupational Stress Questionnaire    Feeling of Stress: Not at all  Social Connections: Unknown (07/14/2024)   Social Connection and Isolation Panel    Frequency of Communication with Friends and Family: More than three times a week    Frequency of Social Gatherings with Friends and Family: More than three times a week    Attends Religious Services: Not on file    Active Member of Clubs or Organizations: Yes    Attends Banker Meetings: Not on file    Marital Status: Widowed  Intimate Partner Violence: Unknown (07/14/2024)   Humiliation, Afraid, Rape, and Kick questionnaire    Fear of Current or Ex-Partner: No    Emotionally Abused: No    Physically Abused: Not on file    Sexually Abused: No   Review of Systems Has felt tired in the past few weeks    Objective:   Physical Exam Constitutional:      Appearance: Normal appearance.  Cardiovascular:     Rate and Rhythm: Normal rate. Rhythm irregular.     Heart sounds: No murmur heard.    No gallop.  Pulmonary:     Effort: Pulmonary effort is normal.     Breath sounds: Normal breath sounds. No wheezing or rales.  Abdominal:     General: Bowel sounds are normal. There is no distension.     Palpations: Abdomen is soft.     Tenderness: There is no guarding or rebound.     Comments: Mild epigastric tenderness  Musculoskeletal:     Cervical back: Neck supple.     Right lower leg: No edema.     Left lower leg: No edema.  Lymphadenopathy:     Cervical: No cervical adenopathy.  Neurological:     Mental Status: She is alert.            Assessment & Plan:

## 2024-07-23 NOTE — Telephone Encounter (Signed)
 Appt now in place to see [name] [name], [facility] and also to have EUS with Dr. Wilhelmenia in Sept 2025 JMP

## 2024-07-23 NOTE — Assessment & Plan Note (Signed)
 A1c down to 5.7% Will have her stop the glipizide 

## 2024-07-23 NOTE — Assessment & Plan Note (Addendum)
#   Gastric tumor- AUG 2025- CT A/P: Large mass within the lumen of the stomach-  Large round well-circumscribed mass in the mid stomach measures 6.3 x 6.3 cm (image 63) mass is contained within lumen of the stomach.. No evidence of metastatic adenopathy in the abdomen pelvis. No liver metastasis. No peritoneal metastasis. CT chest:  Solitary pulmonary nodule in the RIGHT lower lobe is new from 2014. Indeterminate finding. FDG PET scan may aid in determining malignant potential. 2. No mediastinal adenopathy  # EGD biopsy negative/inconclusive.  Currently awaiting endoscopic ultrasound with biopsy [in September].  However recommend PET scan for further evaluation-clinically unlikely recurrent non-Hodgkin's lymphoma > more than 15 years ago [see below]  # NHL/of the bowel - [[facility]]treated >25  y ago- RT.  Chemo x 6- followed by RT- No surgery.   # Severe anemia-second iron  deficiency/c acute on hronic GI bleed secondary to #1-today hemoglobin is 9.5.  Plan Venofer -  # History of A-fib on Eliquis -[Dr.Gollan]-   # DISPOSITION: # No blood- cancel tomorrow appt # venofer  # venofer  in 1 week-  # follow up in 3 weeks-MD: labs- cbc/hold tube;venofer - D-2 possible blood- PET scan- Dr.B  Addendum: patient has severe anaphylactic reaction to IV iron -needing chest compressions/stabilized after epi injection. will discontinue IV iron  infusion.  EMS called-patient will be transported to the hospital.  Cancel IV iron  appts- re-schedule the PEt scan to next 2 weeks- follow up as planned-    Addendum: Discussed with Dr. Gollan cardiology-feels patient a poor candidate for Eliquis ; consider Watchman device [based on gastric mass].  Cardiology office to reach out to patient/family .   # I reviewed the blood work- with the patient in detail; also reviewed the imaging independently [as summarized above]; and with the patient in detail.    # 40 minutes face-to-face with the patient discussing the above plan of care; more  than 50% of time spent on prognosis/ natural history; counseling and coordination.

## 2024-07-23 NOTE — H&P (Signed)
 History and Physical    [name] [name] FMW:[id number] DOB: January 13, 1932 DOA: 07/23/2024  Referring MD/NP/PA:   PCP: Gretta Comer POUR, NP   Patient coming from:  The patient is coming from home.     Chief Complaint:: anaphylactic reaction to IV iron    HPI: [name] [name] is a 89 y.o. 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  Patient was recently hospitalized from 8/14 - 8/20 due to UGIB.  Patient was transfused with 1 unit of blood.  CT scan showed a large mass within the lumen of the stomach. Pt is s/p EGD. Gastric ulcer and bleeding vessel was cauterized by GI. EGD biopsy was negative/inconclusive. Pt is scheduled to have EUS biopsy with Dr. Wilhelmenia of GI in Sept 2025.  Pt was in cancer center to have received IV Venofer  infusion due to iron  deficiency anemia today.  After finishing transfusion, pt developed severe anaphylactic reaction to IV iron , required chest compression and epi injection, no shock delivered.  Patient also received Benadryl  and steroid. Per husband, during the event, patient had hand shaking and syncopized. She recalls sitting, drinking juice and eating a cracker and then feeling dizzy, telling her nurse that she was going to go out.   When I saw pt in ED, she reports frontal chest wall tenderness which is worse on deep breathing.  No difficulty breathing, cough, fever or chills.  No nausea, vomiting, diarrhea or abdominal pain.  No symptoms UTI.  Patient took Eliquis  this morning.  Denies rectal bleeding or dark stool.   Data reviewed independently and ED Course: pt was found to have WBC 5.6, troponin 50 --> 137 --> 120, potassium 3.1, magnesium of 1.8, stable renal function, BNP 596.  Temperature normal, blood pressure 154/81, heart rate 159 --> 117, RR 18,  oxygen saturation 89%, leg 98% on room air.  CT head negative for acute intracranial abnormalities.  Patient is placed in PCU for observation.  Chest x-ray: 1. Left base subsegmental atelectasis. 2. Aortic atherosclerosis and mild enlargement of the cardiac silhouette,  EKG: I have personally reviewed.  A-fib, QTc 519, right bundle blockade, early R wave progression, Q waves in lead III/aVF.   Review of Systems:   General: no fevers, chills, no body weight gain, has fatigue HEENT: no blurry vision, hearing changes or sore throat Respiratory: no dyspnea, coughing, wheezing CV: no chest pain, no palpitations GI: no nausea, vomiting, abdominal pain, diarrhea, constipation GU: no dysuria, burning on urination, increased urinary frequency, hematuria  Ext: no leg edema Neuro: no unilateral weakness, numbness, or tingling, no vision change or hearing loss. Has syncope and dizziness Skin: no rash, no skin tear. MSK: No muscle spasm, no deformity, no limitation of range of movement in spin. Has chest wall pain. Heme: No easy bruising.  Travel history: No recent long distant travel.   Allergy :  Allergies  Allergen Reactions   Other Rash and Anaphylaxis   Shellfish Allergy  Anaphylaxis and Rash   Venofer  [Iron  Sucrose] Anaphylaxis    Severe anaphylactic reaction to Venofer -needing chest compressions; EpiPen .    Cephalexin      REACTION: trash and swelling   Ciprofloxacin     REACTION: u/k   Clopidogrel Bisulfate     REACTION: swelling, rash   Ezetimibe -Simvastatin     REACTION: myalgias  Lidocaine  Hives    ONLY TO ADHESIVE LIDOCAINE  PATCHES. NO PROBLEM WITH INJECTABLE LIDOCAINE .   Nitrofurantoin     REACTION: panama   Pregabalin     REACTION: felt bad   Celecoxib Rash    REACTION: panama   Codeine Rash   Codeine Phosphate Rash   Hydrocod Poli-Chlorphe Poli Er Rash   Propoxyphene Rash   Propoxyphene N-Acetaminophen  Rash   Rofecoxib Rash   Sulfa Antibiotics Rash   Sulfamethoxazole  Rash   Sulfonamide Derivatives Rash    Past Medical History:  Diagnosis Date   Acute bilateral thoracic back pain 08/16/2019   Acute post-hemorrhagic anemia 10/10/2023   Allergy     Cipro, Plavix, Statins   Anemia    Arthritis    Atrial fibrillation (HCC)    CAD (coronary artery disease)    a. s/p tandem Promus DES to LAD in 2009 by Dr. Obie;  b.  LHC (03/22/14):  prox LAD 30%, mid LAD 40%, LAD stent ok with dist 20% ISR, apical LAD occluded with L-L collats filling apical vessel (too small for PCI), mid RCA 20%, EF 70%.  Med Rx   Cancer [facility])    Chronic kidney disease, stage 3b (HCC)    Colon polyps    Diabetes mellitus    Diagnosed 2012   Diverticulosis of colon (without mention of hemorrhage)    DVT (deep venous thrombosis) (HCC) 12/2022   right leg   GERD (gastroesophageal reflux disease)    Glaucoma    Grade I diastolic dysfunction    Grade I diastolic dysfunction    Herpes zoster without complication 04/24/2020   Hyperlipidemia    intol of statins   Hypertension    Kidney stones    Left atrial dilation    Moderate aortic regurgitation    Motion sickness    back seat cars   NHL (non-Hodgkin's lymphoma) (HCC) dx'd 2003   chemo/xrt comp 2003   Personal history of colonic polyps    Proctitis    Pruritic intertrigo 07/18/2014   Urticaria    Wears dentures    Full upper, partial lower   Wears hearing aid in both ears     Past Surgical History:  Procedure Laterality Date   BIOPSY  10/15/2023   Procedure: BIOPSY;  Surgeon: Aundria, Ladell POUR, MD;  Location: [facility] ENDOSCOPY;  Service: Gastroenterology;;   cardiac cath-neg     CATARACT EXTRACTION Bilateral    COLONOSCOPY WITH PROPOFOL  N/A 10/15/2023   Procedure: COLONOSCOPY WITH PROPOFOL ;  Surgeon: Toledo, Ladell POUR, MD;  Location: [facility] ENDOSCOPY;  Service: Gastroenterology;  Laterality: N/A;   CORONARY ANGIOPLASTY WITH STENT PLACEMENT     x 2   dexa-neg     ESOPHAGOGASTRODUODENOSCOPY N/A 07/13/2024   Procedure:  EGD (ESOPHAGOGASTRODUODENOSCOPY);  Surgeon: Jinny Carmine, MD;  Location: [facility] ENDOSCOPY;  Service: Endoscopy;  Laterality: N/A;   ESOPHAGOGASTRODUODENOSCOPY N/A 07/15/2024   Procedure: EGD (ESOPHAGOGASTRODUODENOSCOPY);  Surgeon: Onita Elspeth Sharper, DO;  Location: [facility] ENDOSCOPY;  Service: Gastroenterology;  Laterality: N/A;   ESOPHAGOGASTRODUODENOSCOPY (EGD) WITH PROPOFOL  N/A 10/15/2023   Procedure: ESOPHAGOGASTRODUODENOSCOPY (EGD) WITH PROPOFOL ;  Surgeon: Toledo, Ladell POUR, MD;  Location: [facility] ENDOSCOPY;  Service: Gastroenterology;  Laterality: N/A;   HEMOSTASIS CLIP PLACEMENT  10/15/2023   Procedure: HEMOSTASIS CLIP PLACEMENT;  Surgeon: Aundria, Ladell POUR, MD;  Location: [facility] ENDOSCOPY;  Service: Gastroenterology;;   KNEE SURGERY  10/30/2003   left   LEFT HEART CATHETERIZATION WITH CORONARY ANGIOGRAM N/A 03/22/2014   Procedure: LEFT HEART CATHETERIZATION WITH CORONARY ANGIOGRAM;  Surgeon:  Lonni JONETTA Cash, MD;  Location: [facility] CATH LAB;  Service: Cardiovascular;  Laterality: N/A;   POLYPECTOMY  10/15/2023   Procedure: POLYPECTOMY;  Surgeon: Toledo, Teodoro K, MD;  Location: [facility] ENDOSCOPY;  Service: Gastroenterology;;   stress cardiolite      VAGINAL HYSTERECTOMY     partial, fibroids, one ovary left    Social History:  reports that she has never smoked. She has never used smokeless tobacco. She reports that she does not drink alcohol and does not use drugs.  Family History:  Family History  Problem Relation Age of Onset   Cancer Mother        jaw   Hypertension Father    Heart attack Father    Heart attack Sister    Cancer Sister        unknown primary-mets throughout body   Lung cancer Brother    Colon cancer Neg Hx    Esophageal cancer Neg Hx    Pancreatic cancer Neg Hx    Stomach cancer Neg Hx    Liver disease Neg Hx      Prior to Admission medications   Medication Sig Start Date End Date Taking? Authorizing Provider  apixaban  (ELIQUIS ) 5 MG TABS tablet Take 1 tablet  (5 mg total) by mouth 2 (two) times daily. Patient not taking: Reported on 07/23/2024 03/05/24   Gollan, Timothy J, MD  Bempedoic Acid -Ezetimibe  (NEXLIZET ) 180-10 MG TABS Take 1 tablet by mouth daily. Pt has scheduled an office visit with provider in April, 2025 01/02/24   Cash Lonni JONETTA, MD  Blood Glucose Monitoring Suppl DEVI 1 each by Does not apply route in the morning, at noon, and at bedtime. May substitute to any manufacturer covered by patient's insurance. 02/16/23   [name], Katherine K, NP  brimonidine  (ALPHAGAN ) 0.2 % ophthalmic solution Place 1 drop into the right eye in the morning and at bedtime.    [provider]  Cholecalciferol  (VITAMIN D -3) 1000 UNITS CAPS Take 1 capsule by mouth daily.    [provider]  CRANBERRY PO Take 1 tablet by mouth daily.    [provider]  Dulaglutide  (TRULICITY ) 0.75 MG/0.5ML SOAJ Inject 0.75 mg into the skin once a week. for diabetes. 03/08/24   Gretta Comer POUR, NP  ferrous sulfate  325 (65 FE) MG tablet Take 1 tablet (325 mg total) by mouth daily with breakfast. 10/28/22   Kerman Vina HERO, NP  fexofenadine  (ALLEGRA ) 180 MG tablet Take 1 tablet (180 mg total) by mouth as needed for allergies or rhinitis. 04/03/19   [name], Katherine K, NP  furosemide  (LASIX ) 20 MG tablet Take 1 tablet by mouth daily. May take additional tablet as needed for swelling 03/05/24   Perla Evalene PARAS, MD  gabapentin  (NEURONTIN ) 300 MG capsule Take 1 capsule (300 mg total) by mouth 2 (two) times daily. 02/17/24   Janit Thresa HERO, DPM  Glucose Blood (BLOOD GLUCOSE TEST STRIPS) STRP 1 each by In Vitro route in the morning, at noon, and at bedtime. May substitute to any manufacturer covered by patient's insurance. 02/16/23   [name], Katherine K, NP  isosorbide  mononitrate (IMDUR ) 60 MG 24 hr tablet Take 1.5 tablets (90 mg total) by mouth daily. Patient not taking: Reported on 07/23/2024 03/05/24   Gollan, Timothy J, MD  Lancets Misc. MISC 1 each by Does not  apply route in the morning, at noon, and at bedtime. May substitute to any manufacturer covered by patient's insurance. 02/16/23   Gretta Comer POUR, NP  metoprolol   succinate (TOPROL -XL) 100 MG 24 hr tablet TAKE 1 TABLET BY MOUTH EVERY MORNING WITH OR IMMEDIATELY FOLLOWING A MEAL 03/05/24   Gollan, Timothy J, MD  nitroGLYCERIN  (NITROSTAT ) 0.4 MG SL tablet Place 1 tablet (0.4 mg total) under the tongue every 5 (five) minutes as needed for chest pain. 03/05/24   Gollan, Timothy J, MD  ondansetron  (ZOFRAN -ODT) 4 MG disintegrating tablet Take 1 tablet (4 mg total) by mouth every 8 (eight) hours as needed for nausea or vomiting. 07/12/24   [name], Katherine K, NP  pantoprazole  (PROTONIX ) 40 MG tablet Take 1 tablet (40 mg total) by mouth 2 (two) times daily. TAKE 1 TABLET BY MOUTH DAILY FOR HEARTBURN 07/18/24   Franchot Novel, MD  sertraline  (ZOLOFT ) 100 MG tablet TAKE 1 TABLET BY MOUTH DAILY FOR ANXIETY AND DEPRESSION 07/06/24   [name], Katherine K, NP  vitamin B-12 (CYANOCOBALAMIN ) 1000 MCG tablet Take 1,000 mcg by mouth daily.    [provider]  loratadine  (CLARITIN ) 10 MG tablet Take 10 mg by mouth as needed.   02/15/12  [provider]    Physical Exam: Vitals:   07/23/24 1603 07/23/24 1936 07/23/24 2228 07/24/24 0008  BP:  (!) 164/100  (!) 145/85  Pulse: (!) 117 85 91 78  Resp:  16 14 13   Temp:   98.1 F (36.7 C)   TempSrc:   Oral   SpO2:  94% 92% 95%   General: Not in acute distress HEENT:       Eyes: PERRL, EOMI, no jaundice       ENT: No discharge from the ears and nose, no pharynx injection, no tonsillar enlargement.        Neck: No JVD, no bruit, no mass felt. Heme: No neck lymph node enlargement. Cardiac: S1/S2, irregularly irregular rhythm, no gallops or rubs. Respiratory: No rales, wheezing, rhonchi or rubs. GI: Soft, nondistended, nontender, no rebound pain, no organomegaly, BS present. GU: No hematuria Ext: No pitting leg edema bilaterally. 1+DP/PT pulse  bilaterally. Musculoskeletal: Has front chest wall tenderness on palpation Skin: No rashes.  Neuro: Alert, oriented X3, cranial nerves II-XII grossly intact, moves all extremities  Psych: Patient is not psychotic, no suicidal or hemocidal ideation.  Labs on Admission: I have personally reviewed following labs and imaging studies  CBC: Recent Labs  Lab 07/18/24 0546 07/23/24 1345 07/23/24 1610 07/23/24 2153 07/24/24 0156  WBC 4.6 3.9* 5.6 4.2 4.1  NEUTROABS  --  2.3 2.8  --   --   HGB 8.7* 9.5* 9.4* 9.6* 9.5*  HCT 26.4* 29.6* 30.2* 29.8* 29.8*  MCV 97.8 99.7 101.0* 99.0 101.7*  PLT 203 283 306 274 272   Basic Metabolic Panel: Recent Labs  Lab 07/17/24 0540 07/18/24 0546 07/23/24 1345 07/23/24 1610  NA 139 137 137 138  K 4.2 3.5 3.9 3.1*  CL 106 105 104 104  CO2 26 26 25 24   GLUCOSE 153* 139* 189* 160*  BUN 14 11 10 10   CREATININE 1.20* 1.06* 1.14* 1.09*  CALCIUM 8.5* 8.1* 8.6* 8.4*  MG  --   --   --  1.8   GFR: Estimated Creatinine Clearance: 35.8 mL/min (A) (by C-G formula based on SCr of 1.09 mg/dL (H)). Liver Function Tests: Recent Labs  Lab 07/23/24 1345 07/23/24 1610  AST 20 25  ALT 12 12  ALKPHOS 39 38  BILITOT 0.7 0.9  PROT 6.2* 6.6  ALBUMIN 3.5 3.5   No results for input(s): LIPASE, AMYLASE in the last 168 hours. No  results for input(s): AMMONIA in the last 168 hours. Coagulation Profile: Recent Labs  Lab 07/23/24 1610  INR 1.6*   Cardiac Enzymes: No results for input(s): CKTOTAL, CKMB, CKMBINDEX, TROPONINI in the last 168 hours. BNP (last 3 results) Recent Labs    02/08/24 1131 05/23/24 1049  PROBNP 621.0* 594.0*   HbA1C: Recent Labs    07/23/24 1109  HGBA1C 5.7*   CBG: Recent Labs  Lab 07/18/24 0346 07/18/24 0729 07/18/24 1215 07/18/24 1501 07/23/24 2115  GLUCAP 155* 156* 220* 188* 254*   Lipid Profile: No results for input(s): CHOL, HDL, LDLCALC, TRIG, CHOLHDL, LDLDIRECT in the last 72  hours. Thyroid  Function Tests: No results for input(s): TSH, T4TOTAL, FREET4, T3FREE, THYROIDAB in the last 72 hours. Anemia Panel: No results for input(s): VITAMINB12, FOLATE, FERRITIN, TIBC, IRON , RETICCTPCT in the last 72 hours. Urine analysis:    Component Value Date/Time   COLORURINE AMBER (A) 04/30/2021 1801   APPEARANCEUR CLEAR (A) 04/30/2021 1801   LABSPEC 1.027 04/30/2021 1801   PHURINE 5.0 04/30/2021 1801   GLUCOSEU >=500 (A) 04/30/2021 1801   HGBUR NEGATIVE 04/30/2021 1801   HGBUR small 04/23/2009 0817   BILIRUBINUR neg 05/23/2024 1044   KETONESUR NEGATIVE 04/30/2021 1801   PROTEINUR Positive (A) 05/23/2024 1044   PROTEINUR 30 (A) 04/30/2021 1801   UROBILINOGEN 0.2 05/23/2024 1044   UROBILINOGEN 0.2 04/23/2009 0817   NITRITE neg 05/23/2024 1044   NITRITE POSITIVE (A) 04/30/2021 1801   LEUKOCYTESUR Negative 05/23/2024 1044   LEUKOCYTESUR NEGATIVE 04/30/2021 1801   Sepsis Labs: @LABRCNTIP (procalcitonin:4,lacticidven:4) )No results found for this or any previous visit (from the past 240 hours).   Radiological Exams on Admission:   Assessment/Plan Principal Problem:   Anaphylaxis to venofer  Active Problems:   CAD (coronary artery disease)   Myocardial injury   Persistent atrial fibrillation (HCC)   Chronic diastolic CHF (congestive heart failure) (HCC)   DVT (deep venous thrombosis) (HCC)   Iron  deficiency anemia   Mixed hyperlipidemia   Essential hypertension   Type 2 diabetes mellitus with chronic kidney disease (HCC)   Gastric mass   Chronic kidney disease, stage 3b (HCC)   Hypokalemia   Anxiety and depression   Assessment and Plan:   Anaphylaxis to venofer :   -will place in PCU for obs -will continue Solu-Medrol  40 mg tid -IV Benadryl  12.5 mg bid -IV pepcid  20 mg bid -prn EpiPen  -IVF: 1L of LR in ED  CAD (coronary artery disease) and myocardial injury: trop 50 --> 137 --> 120.  Likely due to demand ischemia.  Patient has  chest wall tenderness which is due to CPR.  IV heparin  was ordered by EDP, however Dr. Rennie is very concerned about high risk of bleeding due to large gastric mass.  He sent a message to Dr. Perla of cardiology, and recommended discontinue Eliquis .  I think this is very reasonable.  Dr. Gollan also think that the risk of Heparin  outweighs the benefit -will d/c IV heparin  and hold off Eliquis  -will not give ASA -continue Zetia   Persistent atrial fibrillation (HCC) -Continue home metoprolol  100 mg daily -As needed IV metoprolol  2.5 mg every 2 hour for heart rate> 125. - Hold Eliquis   Chronic diastolic CHF (congestive heart failure) (HCC): 2D echo on 04/07/2022 showed EF 60 to 65% with grade 1 diastolic dysfunction.  Patient does not have leg edema but has elevated BNP 596, patient is at risk of developing CHF exacerbation. -Will give 40 mg of IV Lasix , then increased her home oral  Lasix  dose from 20 to 40 mg daily  History of DVT (deep venous thrombosis) [facility]):   Lower extremity venous Doppler: Lower extremity venous Doppler 12/20/2022 showed age indeterminate deep vein thrombosis  involving the right soleal veins. The repeated lower extremity venous Doppler on 02/09/2024 showed no evidence of femoropopliteal DVT or superficial thrombophlebitis within either lower extremity. -will hold Eliquis   Iron  deficiency anemia: Hemoglobin stable 9.4 (8.7 on 07/18/2024). -continue oral iron  supplement  Mixed hyperlipidemia -Zetia   Essential hypertension -Hydralazine  as needed - Metoprolol  and Lasix   Type 2 diabetes mellitus with chronic kidney disease (HCC): Recent A1c 5.7, well-controlled.  Patient taking Trulicity . -SSI  Gastric mass -Pt is scheduled to have EUS biopsy with Dr. Wilhelmenia of GI in Sept 2025. -f/u with Dr. Rennie  Chronic kidney disease, stage 3b [facility]): Renal function stable. - Follow-up with BMP  Hypokalemia: Potassium 3.1.  Magnesium 1.8 -Repleted  potassium  Anxiety and depression -Continue home medications       DVT ppx: SCD only to left leg (patient has DVT in right leg)  Code Status: Full code   Family Communication:  Yes, patient's niece   at bed side.      Disposition Plan:  Anticipate discharge back to previous environment  Consults called: Discussed with Dr. Perla of car and Dr. Rennie of oncology by security chat  Admission status and Level of care: Progressive:    for obs     Dispo: The patient is from: Home              Anticipated d/c is to: Home              Anticipated d/c date is: 1 day              Patient currently is not medically stable to d/c.    Severity of Illness:  The appropriate patient status for this patient is OBSERVATION. Observation status is judged to be reasonable and necessary in order to provide the required intensity of service to ensure the patient's safety. The patient's presenting symptoms, physical exam findings, and initial radiographic and laboratory data in the context of their medical condition is felt to place them at decreased risk for further clinical deterioration. Furthermore, it is anticipated that the patient will be medically stable for discharge from the hospital within 2 midnights of admission.        Date of Service 07/24/2024    Caleb Exon [facility]   If 7PM-7AM, please contact night-coverage [email or web address] 07/24/2024, 2:16 AM

## 2024-07-23 NOTE — Transitions of Care (Post Inpatient/ED Visit) (Signed)
   07/23/2024  Name: [name] [name] MRN: [id number] DOB: [birth year]  Today's TOC FU Call Status: Today's TOC FU Call Status:: Unsuccessful Call (3rd Attempt) Unsuccessful Call (3rd Attempt) Date: 07/23/24  Attempted to reach the patient regarding the most recent Inpatient/ED visit.  Follow Up Plan: No further outreach attempts will be made at this time. We have been unable to contact the patient.  Arvin Seip RN, BSN, CCM [employer], Population Health Case Manager Phone: [phone]

## 2024-07-23 NOTE — Progress Notes (Signed)
 Pt not sure when to go back on the eliquis .  Pt states she doesn't know what to eat due to her stomach issue and she stays hungry. Any suggestions?

## 2024-07-23 NOTE — Addendum Note (Signed)
 Addended by: KALLIE CLOTILDA SQUIBB on: 07/23/2024 11:09 AM   Modules accepted: Orders

## 2024-07-23 NOTE — Assessment & Plan Note (Signed)
 Going to oncology this afternoon They will do blood work and decide on the next steps

## 2024-07-23 NOTE — Progress Notes (Signed)
 [facility] CONSULT NOTE  Patient Care Team: Gretta Comer POUR, NP as PCP - General (Internal Medicine) Verlin Lonni BIRCH, MD as PCP - Cardiology (Cardiology) Alline Lenis, MD (Inactive) as Consulting Physician (Urology) Portia Fireman, OD as Consulting Physician (Optometry) Homsher, Wanda, RN as VBCI Care Management  CHIEF COMPLAINTS/PURPOSE OF CONSULTATION: gastric mass  Oncology History   No history exists.    HISTORY OF PRESENTING ILLNESS:   Accompanied by family.   [name] [name] 89 y.o.  female pleasant patient with a female with medical history significant of  Persistent Atrial fibrillation on eliquis , hx of DVT 1 year ago, HTN ,CAD, mitral valve prolapse,GERD, IBS-C,DMII, MALT lymphoma,CKDIIIa, IDA who presents to ED with complaints of coffee grounds emesis as well as dark stools.   Patient was found to be anemic-s/p EGD biopsy.  Patient needed blood transfusion and also iron  infusions.    Initially Eliquis  was held however restarted back on Eliquis  at discharge.  Patient currently s/p evaluation with Dr.Letvak.   Review of Systems  Constitutional:  Positive for malaise/fatigue. Negative for chills, diaphoresis, fever and weight loss.  HENT:  Negative for nosebleeds and sore throat.   Eyes:  Negative for double vision.  Respiratory:  Negative for cough, hemoptysis, sputum production, shortness of breath and wheezing.   Cardiovascular:  Negative for chest pain, palpitations, orthopnea and leg swelling.  Gastrointestinal:  Negative for abdominal pain, blood in stool, constipation, diarrhea, heartburn, melena, nausea and vomiting.  Genitourinary:  Negative for dysuria, frequency and urgency.  Musculoskeletal:  Negative for back pain and joint pain.  Skin: Negative.  Negative for itching and rash.  Neurological:  Negative for dizziness, tingling, focal weakness, weakness and headaches.  Endo/Heme/Allergies:  Does not bruise/bleed easily.   Psychiatric/Behavioral:  Negative for depression. The patient is not nervous/anxious and does not have insomnia.     MEDICAL HISTORY:  Past Medical History:  Diagnosis Date   Acute bilateral thoracic back pain 08/16/2019   Acute post-hemorrhagic anemia 10/10/2023   Allergy     Cipro, Plavix, Statins   Anemia    Arthritis    Atrial fibrillation (HCC)    CAD (coronary artery disease)    a. s/p tandem Promus DES to LAD in 2009 by Dr. Obie;  b.  LHC (03/22/14):  prox LAD 30%, mid LAD 40%, LAD stent ok with dist 20% ISR, apical LAD occluded with L-L collats filling apical vessel (too small for PCI), mid RCA 20%, EF 70%.  Med Rx   Cancer [facility])    Chronic kidney disease, stage 3b (HCC)    Colon polyps    Diabetes mellitus    Diagnosed 2012   Diverticulosis of colon (without mention of hemorrhage)    DVT (deep venous thrombosis) (HCC) 12/2022   right leg   GERD (gastroesophageal reflux disease)    Glaucoma    Grade I diastolic dysfunction    Grade I diastolic dysfunction    Herpes zoster without complication 04/24/2020   Hyperlipidemia    intol of statins   Hypertension    Kidney stones    Left atrial dilation    Moderate aortic regurgitation    Motion sickness    back seat cars   NHL (non-Hodgkin's lymphoma) (HCC) dx'd 2003   chemo/xrt comp 2003   Personal history of colonic polyps    Proctitis    Pruritic intertrigo 07/18/2014   Urticaria    Wears dentures    Full upper, partial lower   Wears hearing aid  in both ears     SURGICAL HISTORY: Past Surgical History:  Procedure Laterality Date   BIOPSY  10/15/2023   Procedure: BIOPSY;  Surgeon: Aundria, Ladell POUR, MD;  Location: [facility] ENDOSCOPY;  Service: Gastroenterology;;   cardiac cath-neg     CATARACT EXTRACTION Bilateral    COLONOSCOPY WITH PROPOFOL  N/A 10/15/2023   Procedure: COLONOSCOPY WITH PROPOFOL ;  Surgeon: Toledo, Ladell POUR, MD;  Location: [facility] ENDOSCOPY;  Service: Gastroenterology;  Laterality: N/A;   CORONARY  ANGIOPLASTY WITH STENT PLACEMENT     x 2   dexa-neg     ESOPHAGOGASTRODUODENOSCOPY N/A 07/13/2024   Procedure: EGD (ESOPHAGOGASTRODUODENOSCOPY);  Surgeon: Jinny Carmine, MD;  Location: [facility] ENDOSCOPY;  Service: Endoscopy;  Laterality: N/A;   ESOPHAGOGASTRODUODENOSCOPY N/A 07/15/2024   Procedure: EGD (ESOPHAGOGASTRODUODENOSCOPY);  Surgeon: Onita Elspeth Sharper, DO;  Location: [facility] ENDOSCOPY;  Service: Gastroenterology;  Laterality: N/A;   ESOPHAGOGASTRODUODENOSCOPY (EGD) WITH PROPOFOL  N/A 10/15/2023   Procedure: ESOPHAGOGASTRODUODENOSCOPY (EGD) WITH PROPOFOL ;  Surgeon: Toledo, Ladell POUR, MD;  Location: [facility] ENDOSCOPY;  Service: Gastroenterology;  Laterality: N/A;   HEMOSTASIS CLIP PLACEMENT  10/15/2023   Procedure: HEMOSTASIS CLIP PLACEMENT;  Surgeon: Aundria, Ladell POUR, MD;  Location: [facility] ENDOSCOPY;  Service: Gastroenterology;;   KNEE SURGERY  10/30/2003   left   LEFT HEART CATHETERIZATION WITH CORONARY ANGIOGRAM N/A 03/22/2014   Procedure: LEFT HEART CATHETERIZATION WITH CORONARY ANGIOGRAM;  Surgeon: Lonni JONETTA Cash, MD;  Location: [facility] CATH LAB;  Service: Cardiovascular;  Laterality: N/A;   POLYPECTOMY  10/15/2023   Procedure: POLYPECTOMY;  Surgeon: Toledo, Ladell POUR, MD;  Location: [facility] ENDOSCOPY;  Service: Gastroenterology;;   stress cardiolite      VAGINAL HYSTERECTOMY     partial, fibroids, one ovary left    SOCIAL HISTORY: Social History   Socioeconomic History   Marital status: Widowed    Spouse name: Not on file   Number of children: 0   Years of education: Not on file   Highest education level: 12th grade  Occupational History   Occupation: Retired    Associate Professor: RETIRED   Occupation: retired  Tobacco Use   Smoking status: Never   Smokeless tobacco: Never  Vaping Use   Vaping status: Never Used  Substance and Sexual Activity   Alcohol use: No   Drug use: No   Sexual activity: Not Currently  Other Topics Concern   Not on file  Social History Narrative   Not on  file   Social Drivers of Health   Financial Resource Strain: Low Risk  (05/20/2024)   Overall Financial Resource Strain (CARDIA)    Difficulty of Paying Living Expenses: Not hard at all  Food Insecurity: No Food Insecurity (07/23/2024)   Hunger Vital Sign    Worried About Running Out of Food in the Last Year: Never true    Ran Out of Food in the Last Year: Never true  Transportation Needs: No Transportation Needs (07/23/2024)   PRAPARE - Administrator, Civil Service (Medical): No    Lack of Transportation (Non-Medical): No  Physical Activity: Inactive (05/20/2024)   Exercise Vital Sign    Days of Exercise per Week: 0 days    Minutes of Exercise per Session: Not on file  Stress: No Stress Concern Present (05/20/2024)   [employer] of Occupational Health - Occupational Stress Questionnaire    Feeling of Stress: Not at all  Social Connections: Unknown (07/14/2024)   Social Connection and Isolation Panel    Frequency of Communication with Friends and Family: More than three  times a week    Frequency of Social Gatherings with Friends and Family: More than three times a week    Attends Religious Services: Not on file    Active Member of Clubs or Organizations: Yes    Attends Banker Meetings: Not on file    Marital Status: Widowed  Intimate Partner Violence: Not At Risk (07/23/2024)   Humiliation, Afraid, Rape, and Kick questionnaire    Fear of Current or Ex-Partner: No    Emotionally Abused: No    Physically Abused: No    Sexually Abused: No    FAMILY HISTORY: Family History  Problem Relation Age of Onset   Cancer Mother        jaw   Hypertension Father    Heart attack Father    Heart attack Sister    Cancer Sister        unknown primary-mets throughout body   Lung cancer Brother    Colon cancer Neg Hx    Esophageal cancer Neg Hx    Pancreatic cancer Neg Hx    Stomach cancer Neg Hx    Liver disease Neg Hx     ALLERGIES:  is allergic to other,  shellfish allergy , cephalexin , ciprofloxacin, clopidogrel bisulfate, ezetimibe -simvastatin, lidocaine , nitrofurantoin, pregabalin, celecoxib, codeine, codeine phosphate, hydrocod poli-chlorphe poli er, propoxyphene, propoxyphene n-acetaminophen , rofecoxib, sulfa antibiotics, sulfamethoxazole, and sulfonamide derivatives.  MEDICATIONS:  Current Outpatient Medications  Medication Sig Dispense Refill   Bempedoic Acid -Ezetimibe  (NEXLIZET ) 180-10 MG TABS Take 1 tablet by mouth daily. Pt has scheduled an office visit with provider in April, 2025 90 tablet 2   Blood Glucose Monitoring Suppl DEVI 1 each by Does not apply route in the morning, at noon, and at bedtime. May substitute to any manufacturer covered by patient's insurance. 1 each 0   brimonidine  (ALPHAGAN ) 0.2 % ophthalmic solution Place 1 drop into the right eye in the morning and at bedtime.     Cholecalciferol  (VITAMIN D -3) 1000 UNITS CAPS Take 1 capsule by mouth daily.     CRANBERRY PO Take 1 tablet by mouth daily.     Dulaglutide  (TRULICITY ) 0.75 MG/0.5ML SOAJ Inject 0.75 mg into the skin once a week. for diabetes. 6 mL 1   ferrous sulfate  325 (65 FE) MG tablet Take 1 tablet (325 mg total) by mouth daily with breakfast. 30 tablet 2   fexofenadine  (ALLEGRA ) 180 MG tablet Take 1 tablet (180 mg total) by mouth as needed for allergies or rhinitis. 90 tablet 0   furosemide  (LASIX ) 20 MG tablet Take 1 tablet by mouth daily. May take additional tablet as needed for swelling 180 tablet 3   gabapentin  (NEURONTIN ) 300 MG capsule Take 1 capsule (300 mg total) by mouth 2 (two) times daily. 90 capsule 2   Glucose Blood (BLOOD GLUCOSE TEST STRIPS) STRP 1 each by In Vitro route in the morning, at noon, and at bedtime. May substitute to any manufacturer covered by patient's insurance. 300 strip 5   Lancets Misc. MISC 1 each by Does not apply route in the morning, at noon, and at bedtime. May substitute to any manufacturer covered by patient's insurance. 300  each 5   metoprolol  succinate (TOPROL -XL) 100 MG 24 hr tablet TAKE 1 TABLET BY MOUTH EVERY MORNING WITH OR IMMEDIATELY FOLLOWING A MEAL 90 tablet 3   nitroGLYCERIN  (NITROSTAT ) 0.4 MG SL tablet Place 1 tablet (0.4 mg total) under the tongue every 5 (five) minutes as needed for chest pain. 25 tablet 2   ondansetron  (ZOFRAN -ODT)  4 MG disintegrating tablet Take 1 tablet (4 mg total) by mouth every 8 (eight) hours as needed for nausea or vomiting. 20 tablet 0   pantoprazole  (PROTONIX ) 40 MG tablet Take 1 tablet (40 mg total) by mouth 2 (two) times daily. TAKE 1 TABLET BY MOUTH DAILY FOR HEARTBURN 60 tablet 0   sertraline  (ZOLOFT ) 100 MG tablet TAKE 1 TABLET BY MOUTH DAILY FOR ANXIETY AND DEPRESSION 90 tablet 0   vitamin B-12 (CYANOCOBALAMIN ) 1000 MCG tablet Take 1,000 mcg by mouth daily.     apixaban  (ELIQUIS ) 5 MG TABS tablet Take 1 tablet (5 mg total) by mouth 2 (two) times daily. (Patient not taking: Reported on 07/23/2024) 180 tablet 1   [Paused] isosorbide  mononitrate (IMDUR ) 60 MG 24 hr tablet Take 1.5 tablets (90 mg total) by mouth daily. (Patient not taking: Reported on 07/23/2024) 135 tablet 3   No current facility-administered medications for this visit.    PHYSICAL EXAMINATION:   Vitals:   07/23/24 1342  BP: 128/72  Pulse: 84  Resp: 16  Temp: 98.3 F (36.8 C)  SpO2: 98%   Filed Weights   07/23/24 1342  Weight: 167 lb 11.2 oz (76.1 kg)    Physical Exam Vitals and nursing note reviewed.  HENT:     Head: Normocephalic and atraumatic.     Mouth/Throat:     Pharynx: Oropharynx is clear.  Eyes:     Extraocular Movements: Extraocular movements intact.     Pupils: Pupils are equal, round, and reactive to light.  Cardiovascular:     Rate and Rhythm: Normal rate and regular rhythm.  Pulmonary:     Comments: Decreased breath sounds bilaterally.  Abdominal:     Palpations: Abdomen is soft.  Musculoskeletal:        General: Normal range of motion.     Cervical back: Normal range  of motion.  Skin:    General: Skin is warm.  Neurological:     General: No focal deficit present.     Mental Status: She is alert and oriented to person, place, and time.  Psychiatric:        Behavior: Behavior normal.        Judgment: Judgment normal.     LABORATORY DATA:  I have reviewed the data as listed Lab Results  Component Value Date   WBC 3.9 (L) 07/23/2024   HGB 9.5 (L) 07/23/2024   HCT 29.6 (L) 07/23/2024   MCV 99.7 07/23/2024   PLT 283 07/23/2024   Recent Labs    07/14/24 0539 07/15/24 0513 07/16/24 0434 07/17/24 0540 07/18/24 0546 07/23/24 1345  NA 137 139 141 139 137 137  K 4.1 3.8 4.3 4.2 3.5 3.9  CL 104 109 107 106 105 104  CO2 27 23 26 26 26 25   GLUCOSE 129* 144* 142* 153* 139* 189*  BUN 38* 22 14 14 11 10   CREATININE 1.31* 1.16* 1.28* 1.20* 1.06* 1.14*  CALCIUM 8.2* 8.2* 8.4* 8.5* 8.1* 8.6*  GFRNONAA 39* 45* 40* 43* 50* 46*  PROT 5.0* 4.9* 5.2*  --   --  6.2*  ALBUMIN 3.0* 3.0* 3.1*  --   --  3.5  AST 25 24 23   --   --  20  ALT 11 11 12   --   --  12  ALKPHOS 21* 23* 25*  --   --  39  BILITOT 0.8 0.8 0.9  --   --  0.7  BILIDIR 0.1 0.2 0.2  --   --   --  IBILI 0.7 0.6 0.7  --   --   --     RADIOGRAPHIC STUDIES: I have personally reviewed the radiological images as listed and agreed with the findings in the report. US  Venous Img Upper Uni Right(DVT) Result Date: 07/19/2024 CLINICAL DATA:  Right upper arm pain and edema. Prior right arm IV placement. EXAM: RIGHT UPPER EXTREMITY VENOUS DOPPLER ULTRASOUND TECHNIQUE: Gray-scale sonography with graded compression, as well as color Doppler and duplex ultrasound were performed to evaluate the upper extremity deep venous system from the level of the subclavian vein and including the jugular, axillary, basilic, radial, ulnar and upper cephalic vein. Spectral Doppler was utilized to evaluate flow at rest and with distal augmentation maneuvers. COMPARISON:  None Available. FINDINGS: Contralateral Subclavian  Vein: Respiratory phasicity is normal and symmetric with the symptomatic side. No evidence of thrombus. Normal compressibility. Internal Jugular Vein: No evidence of thrombus. Normal compressibility, respiratory phasicity and response to augmentation. Subclavian Vein: No evidence of thrombus. Normal compressibility, respiratory phasicity and response to augmentation. Axillary Vein: No evidence of thrombus. Normal compressibility, respiratory phasicity and response to augmentation. Cephalic Vein: Superficial thrombophlebitis of the right cephalic vein with occlusive thrombus present from the level of the proximal forearm to the mid upper arm. Basilic Vein: No evidence of thrombus. Normal compressibility, respiratory phasicity and response to augmentation. Brachial Veins: No evidence of thrombus. Normal compressibility, respiratory phasicity and response to augmentation. Radial Veins: No evidence of thrombus. Normal compressibility, respiratory phasicity and response to augmentation. Ulnar Veins: No evidence of thrombus. Normal compressibility, respiratory phasicity and response to augmentation. Venous Reflux:  None visualized. Other Findings:  No abnormal fluid collections. IMPRESSION: 1. Superficial thrombophlebitis of the right cephalic vein from the level of the proximal forearm to the mid upper arm. 2. No evidence of DVT within the right upper extremity. Electronically Signed   By: Marcey Moan M.D.   On: 07/19/2024 08:25   CT CHEST ABDOMEN PELVIS W CONTRAST Result Date: 07/13/2024 CLINICAL DATA:  Metastatic disease evaluation. * Tracking Code: BO *. EXAM: CT CHEST, ABDOMEN, AND PELVIS WITH CONTRAST TECHNIQUE: Multidetector CT imaging of the chest, abdomen and pelvis was performed following the standard protocol during bolus administration of intravenous contrast. RADIATION DOSE REDUCTION: This exam was performed according to the departmental dose-optimization program which includes automated exposure  control, adjustment of the mA and/or kV according to patient size and/or use of iterative reconstruction technique. CONTRAST:  80mL OMNIPAQUE  IOHEXOL  300 MG/ML  SOLN COMPARISON:  CT chest 08/09/2013 FINDINGS: CT CHEST FINDINGS Cardiovascular: No significant vascular findings. Normal heart size. No pericardial effusion. Mediastinum/Nodes: No axillary or supraclavicular adenopathy. No mediastinal or hilar adenopathy. No pericardial fluid. Esophagus normal. Lungs/Pleura: 10 mm nodule in the RIGHT lower lobe (image 45/2) is new from 2014 Musculoskeletal: No aggressive osseous lesion. CT ABDOMEN AND PELVIS FINDINGS Hepatobiliary: No focal hepatic lesion. No biliary ductal dilatation. Gallbladder is normal. Common bile duct is normal. Pancreas: Pancreas is normal. No ductal dilatation. No pancreatic inflammation. Spleen: Normal spleen Adrenals/urinary tract: Adrenal glands and kidneys are normal. The ureters and bladder normal. Stomach/Bowel: Large round well-circumscribed mass in the mid stomach measures 6.3 x 6.3 cm (image 63) mass is contained within lumen of the stomach. No enlarged lymph nodes in the gastrohepatic ligament. The duodenum, small-bowel colon normal. Vascular/Lymphatic: Abdominal aorta is normal caliber with atherosclerotic calcification. There is no retroperitoneal or periportal lymphadenopathy. No pelvic lymphadenopathy. Reproductive: Unremarkable Other: No peritoneal or omental metastasis Musculoskeletal: No aggressive osseous lesion. IMPRESSION: CHEST: 1.  Solitary pulmonary nodule in the RIGHT lower lobe is new from 2014. Indeterminate finding. FDG PET scan may aid in determining malignant potential. 2. No mediastinal adenopathy. PELVIS: 1. Large mass within the lumen of the stomach. 2. No evidence of metastatic adenopathy in the abdomen pelvis. 3. No liver metastasis. 4. No peritoneal metastasis. 5.  Aortic Atherosclerosis (ICD10-I70.0). Electronically Signed   By: Jackquline Boxer M.D.   On:  07/13/2024 17:35     Gastric mass # Gastric tumor- AUG 2025- CT A/P: Large mass within the lumen of the stomach-  Large round well-circumscribed mass in the mid stomach measures 6.3 x 6.3 cm (image 63) mass is contained within lumen of the stomach.. No evidence of metastatic adenopathy in the abdomen pelvis. No liver metastasis. No peritoneal metastasis. CT chest:  Solitary pulmonary nodule in the RIGHT lower lobe is new from 2014. Indeterminate finding. FDG PET scan may aid in determining malignant potential. 2. No mediastinal adenopathy  # EGD biopsy negative/inconclusive.  Currently awaiting endoscopic ultrasound with biopsy [in September].  However recommend PET scan for further evaluation-clinically unlikely recurrent non-Hodgkin's lymphoma > more than 15 years ago [see below]  # NHL/of the bowel - [[facility]]treated >25  y ago- RT.  Chemo x 6- followed by RT- No surgery.   # Severe anemia-second iron  deficiency/c acute on hronic GI bleed secondary to #1-today hemoglobin is 9.5.  Plan Venofer -  # History of A-fib on Eliquis -[Dr.Gollan]-   # DISPOSITION: # No blood- cancel tomorrow appt # venofer  # venofer  in 1 week-  # follow up in 3 weeks-MD: labs- cbc/hold tube;venofer - D-2 possible blood- PET scan- Dr.B  Addendum: patient has severe anaphylactic reaction to IV iron -needing chest compressions/stabilized after epi injection. will discontinue IV iron  infusion.  EMS called-patient will be transported to the hospital.  Cancel IV iron  appts- re-schedule the PEt scan to next 2 weeks- follow up as planned-    Addendum: Discussed with Dr. Gollan cardiology-feels patient a poor candidate for Eliquis ; consider Watchman device [based on gastric mass].  Cardiology office to reach out to patient/family .   # I reviewed the blood work- with the patient in detail; also reviewed the imaging independently [as summarized above]; and with the patient in detail.    # 40 minutes face-to-face with the patient  discussing the above plan of care; more than 50% of time spent on prognosis/ natural history; counseling and coordination.   Above plan of care was discussed with patient/family in detail.  My contact information was given to the patient/family.     [name] JONELLE Joe, MD 07/23/2024 4:08 PM

## 2024-07-23 NOTE — Telephone Encounter (Signed)
 EUS has been entered for 08/13/24 at 2 pm at [facility] with GM

## 2024-07-23 NOTE — Progress Notes (Signed)
 Symptom Management Clinic  [facility] at [facility] Department of the [place]. [facility] [address] [place], [place] [address] [phone]) [phone] (fax)  Patient Care Team: Gretta Comer POUR, NP as PCP - General (Internal Medicine) Verlin Lonni BIRCH, MD as PCP - Cardiology (Cardiology) Alline Lenis, MD (Inactive) as Consulting Physician (Urology) Portia Fireman, OD as Consulting Physician (Optometry) Homsher, Wanda, RN as [facility] Care Management   Name of the patient: [name] [name]  [id number]  [birth year]   Date of visit: 07/23/24  Diagnosis- gastric mass  Chief complaint/ Reason for visit- cardiac arrest  Heme/Onc history:  Oncology History   No history exists.    Interval history- called to infusion by nursing for report of cardiac arrest post venofer  infusion   Review of systems- Review of Systems  Unable to perform ROS: Acuity of condition      Allergies  Allergen Reactions   Other Rash and Anaphylaxis   Shellfish Allergy  Anaphylaxis and Rash   Venofer  [Iron  Sucrose] Anaphylaxis    Severe anaphylactic reaction to Venofer -needing chest compressions; EpiPen .    Cephalexin      REACTION: trash and swelling   Ciprofloxacin     REACTION: u/k   Clopidogrel Bisulfate     REACTION: swelling, rash   Ezetimibe -Simvastatin     REACTION: myalgias   Lidocaine  Hives    ONLY TO ADHESIVE LIDOCAINE  PATCHES. NO PROBLEM WITH INJECTABLE LIDOCAINE .   Nitrofurantoin     REACTION: panama   Pregabalin     REACTION: felt bad   Celecoxib Rash    REACTION: panama   Codeine Rash   Codeine Phosphate Rash   Hydrocod Poli-Chlorphe Poli Er Rash   Propoxyphene Rash   Propoxyphene N-Acetaminophen  Rash   Rofecoxib Rash   Sulfa Antibiotics Rash   Sulfamethoxazole Rash   Sulfonamide Derivatives Rash    Past Medical History:  Diagnosis Date   Acute bilateral thoracic back pain 08/16/2019   Acute post-hemorrhagic  anemia 10/10/2023   Allergy     Cipro, Plavix, Statins   Anemia    Arthritis    Atrial fibrillation (HCC)    CAD (coronary artery disease)    a. s/p tandem Promus DES to LAD in 2009 by Dr. Obie;  b.  LHC (03/22/14):  prox LAD 30%, mid LAD 40%, LAD stent ok with dist 20% ISR, apical LAD occluded with L-L collats filling apical vessel (too small for PCI), mid RCA 20%, EF 70%.  Med Rx   Cancer [facility])    Chronic kidney disease, stage 3b (HCC)    Colon polyps    Diabetes mellitus    Diagnosed 2012   Diverticulosis of colon (without mention of hemorrhage)    DVT (deep venous thrombosis) (HCC) 12/2022   right leg   GERD (gastroesophageal reflux disease)    Glaucoma    Grade I diastolic dysfunction    Grade I diastolic dysfunction    Herpes zoster without complication 04/24/2020   Hyperlipidemia    intol of statins   Hypertension    Kidney stones    Left atrial dilation    Moderate aortic regurgitation    Motion sickness    back seat cars   NHL (non-Hodgkin's lymphoma) (HCC) dx'd 2003   chemo/xrt comp 2003   Personal history of colonic polyps    Proctitis    Pruritic intertrigo 07/18/2014   Urticaria    Wears dentures    Full upper, partial lower   Wears hearing  aid in both ears     Current Outpatient Medications:    apixaban  (ELIQUIS ) 5 MG TABS tablet, Take 1 tablet (5 mg total) by mouth 2 (two) times daily. (Patient not taking: Reported on 07/23/2024), Disp: 180 tablet, Rfl: 1   Bempedoic Acid -Ezetimibe  (NEXLIZET ) 180-10 MG TABS, Take 1 tablet by mouth daily. Pt has scheduled an office visit with provider in April, 2025, Disp: 90 tablet, Rfl: 2   Blood Glucose Monitoring Suppl DEVI, 1 each by Does not apply route in the morning, at noon, and at bedtime. May substitute to any manufacturer covered by patient's insurance., Disp: 1 each, Rfl: 0   brimonidine  (ALPHAGAN ) 0.2 % ophthalmic solution, Place 1 drop into the right eye in the morning and at bedtime., Disp: , Rfl:     Cholecalciferol  (VITAMIN D -3) 1000 UNITS CAPS, Take 1 capsule by mouth daily., Disp: , Rfl:    CRANBERRY PO, Take 1 tablet by mouth daily., Disp: , Rfl:    Dulaglutide  (TRULICITY ) 0.75 MG/0.5ML SOAJ, Inject 0.75 mg into the skin once a week. for diabetes., Disp: 6 mL, Rfl: 1   ferrous sulfate  325 (65 FE) MG tablet, Take 1 tablet (325 mg total) by mouth daily with breakfast., Disp: 30 tablet, Rfl: 2   fexofenadine  (ALLEGRA ) 180 MG tablet, Take 1 tablet (180 mg total) by mouth as needed for allergies or rhinitis., Disp: 90 tablet, Rfl: 0   furosemide  (LASIX ) 20 MG tablet, Take 1 tablet by mouth daily. May take additional tablet as needed for swelling, Disp: 180 tablet, Rfl: 3   gabapentin  (NEURONTIN ) 300 MG capsule, Take 1 capsule (300 mg total) by mouth 2 (two) times daily., Disp: 90 capsule, Rfl: 2   Glucose Blood (BLOOD GLUCOSE TEST STRIPS) STRP, 1 each by In Vitro route in the morning, at noon, and at bedtime. May substitute to any manufacturer covered by patient's insurance., Disp: 300 strip, Rfl: 5   [Paused] isosorbide  mononitrate (IMDUR ) 60 MG 24 hr tablet, Take 1.5 tablets (90 mg total) by mouth daily. (Patient not taking: Reported on 07/23/2024), Disp: 135 tablet, Rfl: 3   Lancets Misc. MISC, 1 each by Does not apply route in the morning, at noon, and at bedtime. May substitute to any manufacturer covered by patient's insurance., Disp: 300 each, Rfl: 5   metoprolol  succinate (TOPROL -XL) 100 MG 24 hr tablet, TAKE 1 TABLET BY MOUTH EVERY MORNING WITH OR IMMEDIATELY FOLLOWING A MEAL, Disp: 90 tablet, Rfl: 3   nitroGLYCERIN  (NITROSTAT ) 0.4 MG SL tablet, Place 1 tablet (0.4 mg total) under the tongue every 5 (five) minutes as needed for chest pain., Disp: 25 tablet, Rfl: 2   ondansetron  (ZOFRAN -ODT) 4 MG disintegrating tablet, Take 1 tablet (4 mg total) by mouth every 8 (eight) hours as needed for nausea or vomiting., Disp: 20 tablet, Rfl: 0   pantoprazole  (PROTONIX ) 40 MG tablet, Take 1 tablet (40 mg  total) by mouth 2 (two) times daily. TAKE 1 TABLET BY MOUTH DAILY FOR HEARTBURN, Disp: 60 tablet, Rfl: 0   sertraline  (ZOLOFT ) 100 MG tablet, TAKE 1 TABLET BY MOUTH DAILY FOR ANXIETY AND DEPRESSION, Disp: 90 tablet, Rfl: 0   vitamin B-12 (CYANOCOBALAMIN ) 1000 MCG tablet, Take 1,000 mcg by mouth daily., Disp: , Rfl:   Physical exam: There were no vitals filed for this visit. Physical Exam Constitutional:      Comments: Unconscious upon arrival. In recliner being placed on backboard by nursing  Cardiovascular:     Comments: No palpable pulse upon arrival Pulmonary:  Comments: No spontaneous breathing upon arrival Skin:    Comments: Pale. Peripheral IV left AC with bolus fluids.      Assessment and plan- Patient is a 89 y.o. female   Infusion Reaction/Hypersensitivity with cardiac arrest- patient receiving IV Venofer  and per nursing had loss of pulse, respirations, and consciousness. Nursing had started CPR ~ 1-2 min, stopped infusion, and started IV fluids. I along with [name] Mower, NP and Dr Rennie, and nursing staff provided emergency care. Patient received 0.3 mg (epi-pen) IM epinephrine , 50 mg solumedrol, and 25 mg iv benadryl  along with bolus iv fluids. She received supplemental O2. She had spontaneous return of pulse, respirations, and consciousness. A fib on monitor. Normotensive. Drowsy. Daughter at chairside. EMS arrived, report provided to paramedics and called ER charge. Venofer  added to allergy  list. She has previously received 4 doses of IV Venofer  and tolerated well. No history of autoimmune disease. Would not rechallenge in the future even with pre-medications based on severity of her reaction. Emotional support provided to patient's daughter.    Visit Diagnosis 1. Infusion reaction, initial encounter   2. Cardiac arrest [facility])     Patient expressed understanding and was in agreement with this plan. She also understands that She can call clinic at any time with any  questions, concerns, or complaints.   Tinnie Dawn, DNP, AGNP-C, AOCNP Cancer Center at [facility] [phone]

## 2024-07-23 NOTE — ED Triage Notes (Signed)
 From Cancer center, receiving Iron  infusion, was given CA drug and went into a potential anaphylactic reaction, followed by cardiac arrest, Staff started CPR x 1 min. Pt received Epi auto injector, solumedrol. Afib RVR with EMS. Pt describes, feeling dizzy prior to passing out.

## 2024-07-23 NOTE — Consult Note (Addendum)
 PHARMACY - ANTICOAGULATION CONSULT NOTE  Pharmacy Consult for Heparin   Indication: chest pain/ACS/STEMI  Allergies  Allergen Reactions   Other Rash and Anaphylaxis   Shellfish Allergy  Anaphylaxis and Rash   Venofer  [Iron  Sucrose] Anaphylaxis    Severe anaphylactic reaction to Venofer -needing chest compressions; EpiPen .    Cephalexin      REACTION: trash and swelling   Ciprofloxacin     REACTION: u/k   Clopidogrel Bisulfate     REACTION: swelling, rash   Ezetimibe -Simvastatin     REACTION: myalgias   Lidocaine  Hives    ONLY TO ADHESIVE LIDOCAINE  PATCHES. NO PROBLEM WITH INJECTABLE LIDOCAINE .   Nitrofurantoin     REACTION: panama   Pregabalin     REACTION: felt bad   Celecoxib Rash    REACTION: panama   Codeine Rash   Codeine Phosphate Rash   Hydrocod Poli-Chlorphe Poli Er Rash   Propoxyphene Rash   Propoxyphene N-Acetaminophen  Rash   Rofecoxib Rash   Sulfa Antibiotics Rash   Sulfamethoxazole Rash   Sulfonamide Derivatives Rash    Patient Measurements:    Vital Signs: Temp: 98.4 F (36.9 C) (08/25 1600) Temp Source: Oral (08/25 1600) BP: 154/81 (08/25 1600) Pulse Rate: 117 (08/25 1603)  Labs: Recent Labs    07/23/24 1345 07/23/24 1610 07/23/24 1750  HGB 9.5* 9.4*  --   HCT 29.6* 30.2*  --   PLT 283 306  --   CREATININE 1.14* 1.09*  --   TROPONINIHS  --  50* 137*    Estimated Creatinine Clearance: 35.8 mL/min (A) (by C-G formula based on SCr of 1.09 mg/dL (H)).   Medical History: Past Medical History:  Diagnosis Date   Acute bilateral thoracic back pain 08/16/2019   Acute post-hemorrhagic anemia 10/10/2023   Allergy     Cipro, Plavix, Statins   Anemia    Arthritis    Atrial fibrillation (HCC)    CAD (coronary artery disease)    a. s/p tandem Promus DES to LAD in 2009 by Dr. Obie;  b.  LHC (03/22/14):  prox LAD 30%, mid LAD 40%, LAD stent ok with dist 20% ISR, apical LAD occluded with L-L collats filling apical vessel (too small for PCI), mid RCA 20%, EF  70%.  Med Rx   Cancer [facility])    Chronic kidney disease, stage 3b (HCC)    Colon polyps    Diabetes mellitus    Diagnosed 2012   Diverticulosis of colon (without mention of hemorrhage)    DVT (deep venous thrombosis) (HCC) 12/2022   right leg   GERD (gastroesophageal reflux disease)    Glaucoma    Grade I diastolic dysfunction    Grade I diastolic dysfunction    Herpes zoster without complication 04/24/2020   Hyperlipidemia    intol of statins   Hypertension    Kidney stones    Left atrial dilation    Moderate aortic regurgitation    Motion sickness    back seat cars   NHL (non-Hodgkin's lymphoma) (HCC) dx'd 2003   chemo/xrt comp 2003   Personal history of colonic polyps    Proctitis    Pruritic intertrigo 07/18/2014   Urticaria    Wears dentures    Full upper, partial lower   Wears hearing aid in both ears     Medications:  (Not in a hospital admission)    Assessment: Patient is a 89 y.o female with a PMH of a.fib on Eliquis  at home and admitted for Cardiac arrest 2/2 a hypersensitivity infusion reaction  to Venofer  on 8/25. Patient was also recently admitted for a GI Bleed and discharged on 8/20. Per discharge note, Eliquis  was resumed after resolution of symptoms at discharge. Med rec tech confirmed that the patient had her last dose of Eliquis  at 08:00 AM on 8/25. Baseline labs: Heparin  level >1.10, INR pending, and aPTT 36.    Goal of Therapy:  Heparin  level 0.3-0.7 units/ml aPTT 66-102 seconds Monitor platelets by anticoagulation protocol: Yes   Plan:  Due to recent GI bleed, age, and home DOAC use, will omit the bolus dose.  Start heparin  infusion at 900 units/hr Check aPTT level in 8 hours and daily while on heparin  Continue to monitor H&H and platelets. INR level may be elevated due to home DOAC use.   Annabella LOISE Banks, PharmD  07/23/2024,6:49 PM

## 2024-07-23 NOTE — ED Provider Notes (Signed)
 [facility] Provider Note    Event Date/Time   First MD Initiated Contact with Patient 07/23/24 [phone]     (approximate)   History   Cardiac Arrest and Hypersensitivity Reaction   HPI  [name] [name] is a 89 y.o. female who presents to the ED for evaluation of Cardiac Arrest and Hypersensitivity Reaction   Recent medical admission 8/14 - 8/20, upper GI bleeding requiring PRBC, EGD with bleeding ulcer and large gastric mass.  Biopsies were negative at that time, referred for outpatient EGD with endoscopic ultrasound. Baseline history of persistent A-fib on Eliquis , DM, CKD, multiple lymphoma, IDA  Patient presents from nearby cancer center where she was getting an IV iron  transfusion.  History is somewhat limited to, it sounds like she finished her transfusion of iron  sucrose and they were watching her afterwards and then she syncopized or arrested.  She recalls finishing, sitting drinking juice and eating a cracker and then feeling dizzy and telling her nurse that she was going to go out.  EMS tells us  that she had an anaphylactic reaction and they provided an epi autoinjector, Benadryl , steroids   Physical Exam   Triage Vital Signs: ED Triage Vitals [07/23/24 1600]  Encounter Vitals Group     BP      Girls Systolic BP Percentile      Girls Diastolic BP Percentile      Boys Systolic BP Percentile      Boys Diastolic BP Percentile      Pulse      Resp      Temp      Temp src      SpO2      Weight      Height      Head Circumference      Peak Flow      Pain Score 4     Pain Loc      Pain Education      Exclude from Growth Chart     Most recent vital signs: Vitals:   07/23/24 1936 07/23/24 2228  BP: (!) 164/100   Pulse: 85 91  Resp: 16 14  Temp:  98.1 F (36.7 C)  SpO2: 94% 92%    General: Awake, no distress.  Sitting upright in bed, generally well-appearing, complaining of chest pain  CV:  Good peripheral perfusion.  Resp:  Normal  effort.  Abd:  No distention.  MSK:  No deformity noted.  Anterior chest wall tenderness Neuro:  No focal deficits appreciated. Other:     ED Results / Procedures / Treatments   Labs (all labs ordered are listed, but only abnormal results are displayed) Labs Reviewed  COMPREHENSIVE METABOLIC PANEL WITH GFR - Abnormal; Notable for the following components:      Result Value   Potassium 3.1 (*)    Glucose, Bld 160 (*)    Creatinine, Ser 1.09 (*)    Calcium 8.4 (*)    GFR, Estimated 48 (*)    All other components within normal limits  CBC WITH DIFFERENTIAL/PLATELET - Abnormal; Notable for the following components:   RBC 2.99 (*)    Hemoglobin 9.4 (*)    HCT 30.2 (*)    MCV 101.0 (*)    RDW 19.3 (*)    nRBC 0.4 (*)    Abs Immature Granulocytes 0.13 (*)    All other components within normal limits  HEPARIN  LEVEL (UNFRACTIONATED) - Abnormal; Notable for the following components:   Heparin  Unfractionated >1.10 (*)  All other components within normal limits  BRAIN NATRIURETIC PEPTIDE - Abnormal; Notable for the following components:   B Natriuretic Peptide 596.1 (*)    All other components within normal limits  PROTIME-INR - Abnormal; Notable for the following components:   Prothrombin Time 20.3 (*)    INR 1.6 (*)    All other components within normal limits  CBC - Abnormal; Notable for the following components:   RBC 3.01 (*)    Hemoglobin 9.6 (*)    HCT 29.8 (*)    RDW 19.6 (*)    All other components within normal limits  CBG MONITORING, ED - Abnormal; Notable for the following components:   Glucose-Capillary 254 (*)    All other components within normal limits  TROPONIN I (HIGH SENSITIVITY) - Abnormal; Notable for the following components:   Troponin I (High Sensitivity) 50 (*)    All other components within normal limits  TROPONIN I (HIGH SENSITIVITY) - Abnormal; Notable for the following components:   Troponin I (High Sensitivity) 137 (*)    All other components  within normal limits  TROPONIN I (HIGH SENSITIVITY) - Abnormal; Notable for the following components:   Troponin I (High Sensitivity) 120 (*)    All other components within normal limits  MAGNESIUM  APTT  URINALYSIS, ROUTINE W REFLEX MICROSCOPIC  CBC  BASIC METABOLIC PANEL WITH GFR  CBC  TYPE AND SCREEN    EKG Rapid A-fib with a rate of 108 bpm.  Right bundle, no STEMI.  RADIOLOGY CT head interpreted by me without evidence of acute intracranial pathology CXR interpreted by me without evidence of acute cardiopulmonary pathology.  Official radiology report(s): CT HEAD WO CONTRAST ( ) Result Date: 07/23/2024 CLINICAL DATA:  Syncope, anticoagulation, evaluate for intracranial hemorrhage EXAM: CT HEAD WITHOUT CONTRAST TECHNIQUE: Contiguous axial images were obtained from the base of the skull through the vertex without intravenous contrast. RADIATION DOSE REDUCTION: This exam was performed according to the departmental dose-optimization program which includes automated exposure control, adjustment of the mA and/or kV according to patient size and/or use of iterative reconstruction technique. COMPARISON:  10/09/2023 FINDINGS: Brain: No evidence of acute infarction, hemorrhage, hydrocephalus, extra-axial collection or mass lesion/mass effect. Vascular: No hyperdense vessel or unexpected calcification. Skull: Normal. Negative for fracture or focal lesion. Sinuses/Orbits: No acute finding. Chronic mucosal thickening and bony sinus wall thickening of the partially imaged maxillary sinuses. Other: None. IMPRESSION: 1. No acute intracranial pathology. 2. Chronic sinusitis, partially imaged. Electronically Signed   By: Marolyn JONETTA Jaksch M.D.   On: 07/23/2024 19:11   DG Chest Portable 1 View Result Date: 07/23/2024 EXAM: 1 VIEW XRAY OF THE CHEST 07/23/2024 04:20:00 PM COMPARISON: 11/20/2022 CLINICAL HISTORY: Cardiac arrest? Got brief chest compressions. FINDINGS: LUNGS AND PLEURA: No significant pleural  effusion or pneumothorax. Subsegmental atelectasis versus scar in the left base. No airspace consolidation. HEART AND MEDIASTINUM: Mild enlargement of the cardiac silhouette. Aortic atherosclerotic calcification. BONES AND SOFT TISSUES: Visualized osseous structures appear grossly intact. Unchanged asymmetric elevation of the right hemidiaphragm. IMPRESSION: 1. Left base subsegmental atelectasis. 2. Aortic atherosclerosis and mild enlargement of the cardiac silhouette, Electronically signed by: Waddell Calk MD 07/23/2024 04:48 PM EDT RP Workstation: [id number]    PROCEDURES and INTERVENTIONS:  .Critical Care  Performed by: Claudene Rover, MD Authorized by: Claudene Rover, MD   Critical care provider statement:    Critical care time (minutes):  30   Critical care time was exclusive of:  Separately billable procedures and treating other patients  Critical care was necessary to treat or prevent imminent or life-threatening deterioration of the following conditions:  Cardiac failure and circulatory failure   Critical care was time spent personally by me on the following activities:  Development of treatment plan with patient or surrogate, discussions with consultants, evaluation of patient's response to treatment, examination of patient, ordering and review of laboratory studies, ordering and review of radiographic studies, ordering and performing treatments and interventions, pulse oximetry, re-evaluation of patient's condition and review of old charts .1-3 Lead EKG Interpretation  Performed by: Claudene Rover, MD Authorized by: Claudene Rover, MD     Interpretation: normal     ECG rate:  92   ECG rate assessment: normal     Rhythm: atrial fibrillation     Ectopy: none     Conduction: normal     Medications  diphenhydrAMINE  (BENADRYL ) injection 12.5 mg (12.5 mg Intravenous Given 07/23/24 2110)  famotidine  (PEPCID ) IVPB 20 mg premix (0 mg Intravenous Stopped 07/23/24 2142)  methylPREDNISolone  sodium  succinate (SOLU-MEDROL ) 125 mg/2 mL injection 80 mg (80 mg Intravenous Given 07/23/24 1954)  EPINEPHrine  (EPI-PEN) injection 0.3 mg (has no administration in time range)  nitroGLYCERIN  (NITROSTAT ) SL tablet 0.4 mg (has no administration in time range)  morphine  (PF) 2 MG/ML injection 0.5 mg (0.5 mg Intravenous Given 07/23/24 2205)  hydrALAZINE  (APRESOLINE ) injection 5 mg (has no administration in time range)  insulin  aspart (novoLOG ) injection 0-9 Units (has no administration in time range)  insulin  aspart (novoLOG ) injection 0-5 Units (3 Units Subcutaneous Given 07/23/24 2120)  furosemide  (LASIX ) tablet 40 mg (has no administration in time range)  metoprolol  succinate (TOPROL -XL) 24 hr tablet 100 mg (has no administration in time range)  pantoprazole  (PROTONIX ) EC tablet 40 mg (40 mg Oral Given 07/23/24 2205)  ferrous sulfate  tablet 325 mg (has no administration in time range)  cyanocobalamin  (VITAMIN B12) tablet 1,000 mcg (has no administration in time range)  gabapentin  (NEURONTIN ) capsule 200 mg (200 mg Oral Given 07/23/24 2205)  cholecalciferol  (VITAMIN D3) 25 MCG (1000 UNIT) tablet 1,000 Units (has no administration in time range)  brimonidine  (ALPHAGAN ) 0.2 % ophthalmic solution 1 drop (has no administration in time range)  sertraline  (ZOLOFT ) tablet 100 mg (has no administration in time range)  ezetimibe  (ZETIA ) tablet 10 mg (has no administration in time range)  lactated ringers  bolus 1,000 mL (0 mLs Intravenous Stopped 07/23/24 1757)  morphine  (PF) 4 MG/ML injection 4 mg (4 mg Intravenous Given 07/23/24 1617)  potassium chloride  SA (KLOR-CON  M) CR tablet 40 mEq (40 mEq Oral Given 07/23/24 1954)     IMPRESSION / MDM / ASSESSMENT AND PLAN / ED COURSE  I reviewed the triage vital signs and the nursing notes.  Differential diagnosis includes, but is not limited to, seizure, syncope, anaphylaxis, vasovagal, cardiac arrest, ventricular dysrhythmia  {Patient presents with symptoms of an acute  illness or injury that is potentially life-threatening.  Patient presents to the ED complaining of chest pain after receiving CPR at the local cancer center.   Reportedly lost pulses, and some concerns for anaphylaxis.  Received an EpiPen  and anaphylaxis management.  I am told an AED was applied and did not shock.   She presents to us  awake, alert and reporting chest discomfort, likely from CPR.  Maintains stability, known A-fib on the monitor without further dysrhythmias, pain controlled with morphine  and she look systemically well.  She is rising troponins and a nonischemic EKG.  Troponins may be related to the trauma of CPR, but  with her only complaint of chest discomfort, and NSTEMI or cardiac dysrhythmia is still a possibility so we will initiate IV heparin .   Mild normocytic anemia, mild hypokalemia.  Consult medicine for admission.  Clinical Course as of 07/23/24 2241  Mon Jul 23, 2024  1753 Reassessed and discussed with family at the bedside, patient's niece and her husband are here we discussed plan of care.  They are agreeable. [DS]    Clinical Course User Index [DS] Claudene Rover, MD     FINAL CLINICAL IMPRESSION(S) / ED DIAGNOSES   Final diagnoses:  Other chest pain     Rx / DC Orders   ED Discharge Orders     None        Note:  This document was prepared using Dragon voice recognition software and may include unintentional dictation errors.   Claudene Rover, MD 07/23/24 [phone]

## 2024-07-23 NOTE — Assessment & Plan Note (Signed)
 Unclear etiology Going back to GI next week On higher pantoprazole  dose now Pain gone and minimal tenderness

## 2024-07-23 NOTE — Progress Notes (Signed)
 Hypersensitivity Reaction note  Date of event: 07/23/24 Time of event: 1517 Generic name of drug involved: Iron  Sucrose Name of provider notified of the hypersensitivity reaction: Tinnie Dawn, NP Was agent that likely caused hypersensitivity reaction added to Allergies List within EMR? Yes Chain of events including reaction signs/symptoms, treatment administered, and outcome (e.g., drug resumed; drug discontinued; sent to Emergency Department; etc.)   @ 1512 200 mg Venofer  given IV push over 3 minutes and 12 seconds, followed by 10 ml NS flush at @ 1516.  @ 1517, patient started gasping for air.  @ 1518, NP notified and EMS called.  @ 1518, patient was not breathing and pulseless. CPR started immediately. NS wide open to left antecubital. Oxygen applied. NP and MD to chairside @ 1519.  Epi 0.3 mg given to R thigh @ 1520, per Dr. Rennie. AED pads applied. Patient conscious and breathing and pulse noted @1521 . CPR stopped.  Benadryl  25 mg given IV @ 1522 and Solumedrol 50 mg given IV @ 1523, per Dr. Rennie. Patient awake and responding appropriately.  EMS arrived @ 1533. Patient transported to ED for further evaluation and treatment.     Jeralene Dimes, RN 07/23/2024 4:12 PM

## 2024-07-23 NOTE — Assessment & Plan Note (Signed)
Rate is fine On the eliquis 

## 2024-07-24 ENCOUNTER — Telehealth: Payer: Self-pay

## 2024-07-24 ENCOUNTER — Inpatient Hospital Stay

## 2024-07-24 DIAGNOSIS — I5032 Chronic diastolic (congestive) heart failure: Secondary | ICD-10-CM | POA: Diagnosis not present

## 2024-07-24 DIAGNOSIS — I4819 Other persistent atrial fibrillation: Secondary | ICD-10-CM | POA: Diagnosis not present

## 2024-07-24 DIAGNOSIS — I5A Non-ischemic myocardial injury (non-traumatic): Secondary | ICD-10-CM | POA: Diagnosis not present

## 2024-07-24 DIAGNOSIS — E782 Mixed hyperlipidemia: Secondary | ICD-10-CM | POA: Diagnosis not present

## 2024-07-24 DIAGNOSIS — E1122 Type 2 diabetes mellitus with diabetic chronic kidney disease: Secondary | ICD-10-CM

## 2024-07-24 DIAGNOSIS — T782XXA Anaphylactic shock, unspecified, initial encounter: Secondary | ICD-10-CM | POA: Diagnosis not present

## 2024-07-24 DIAGNOSIS — D509 Iron deficiency anemia, unspecified: Secondary | ICD-10-CM | POA: Diagnosis not present

## 2024-07-24 DIAGNOSIS — E876 Hypokalemia: Secondary | ICD-10-CM

## 2024-07-24 DIAGNOSIS — K3189 Other diseases of stomach and duodenum: Secondary | ICD-10-CM | POA: Diagnosis not present

## 2024-07-24 DIAGNOSIS — I10 Essential (primary) hypertension: Secondary | ICD-10-CM | POA: Diagnosis not present

## 2024-07-24 LAB — CBC
HCT: 29.8 % — ABNORMAL LOW (ref 36.0–46.0)
Hemoglobin: 9.5 g/dL — ABNORMAL LOW (ref 12.0–15.0)
MCH: 32.4 pg (ref 26.0–34.0)
MCHC: 31.9 g/dL (ref 30.0–36.0)
MCV: 101.7 fL — ABNORMAL HIGH (ref 80.0–100.0)
Platelets: 272 K/uL (ref 150–400)
RBC: 2.93 MIL/uL — ABNORMAL LOW (ref 3.87–5.11)
RDW: 19.2 % — ABNORMAL HIGH (ref 11.5–15.5)
WBC: 4.1 K/uL (ref 4.0–10.5)
nRBC: 0 % (ref 0.0–0.2)

## 2024-07-24 LAB — BASIC METABOLIC PANEL WITH GFR
Anion gap: 5 (ref 5–15)
BUN: 13 mg/dL (ref 8–23)
CO2: 27 mmol/L (ref 22–32)
Calcium: 8.6 mg/dL — ABNORMAL LOW (ref 8.9–10.3)
Chloride: 108 mmol/L (ref 98–111)
Creatinine, Ser: 1.01 mg/dL — ABNORMAL HIGH (ref 0.44–1.00)
GFR, Estimated: 53 mL/min — ABNORMAL LOW (ref >60)
Glucose, Bld: 222 mg/dL — ABNORMAL HIGH (ref 70–99)
Potassium: 4.8 mmol/L (ref 3.5–5.1)
Sodium: 140 mmol/L (ref 135–145)

## 2024-07-24 LAB — TYPE AND SCREEN: ABO/RH(D): A POS

## 2024-07-24 LAB — URINALYSIS, ROUTINE W REFLEX MICROSCOPIC
Bacteria, UA: NONE SEEN
Bilirubin Urine: NEGATIVE
Glucose, UA: 50 mg/dL — AB
Hgb urine dipstick: NEGATIVE
Ketones, ur: NEGATIVE mg/dL
Leukocytes,Ua: NEGATIVE
Nitrite: NEGATIVE
Protein, ur: 100 mg/dL — AB
Specific Gravity, Urine: 1.015 (ref 1.005–1.030)
pH: 6 (ref 5.0–8.0)

## 2024-07-24 LAB — CBG MONITORING, ED
Glucose-Capillary: 214 mg/dL — ABNORMAL HIGH (ref 70–99)
Glucose-Capillary: 242 mg/dL — ABNORMAL HIGH (ref 70–99)

## 2024-07-24 LAB — TROPONIN I (HIGH SENSITIVITY)
Troponin I (High Sensitivity): 68 ng/L — ABNORMAL HIGH (ref <18)
Troponin I (High Sensitivity): 51 ng/L — ABNORMAL HIGH (ref <18)

## 2024-07-24 MED ORDER — EZETIMIBE 10 MG PO TABS: 10.0000 mg | ORAL_TABLET | Freq: Every day | ORAL | 1 refills | Status: DC

## 2024-07-24 MED ORDER — DICLOFENAC SODIUM 1 % EX GEL: 4.0000 g | Freq: Four times a day (QID) | CUTANEOUS | 1 refills | Status: AC

## 2024-07-24 MED ORDER — METOPROLOL TARTRATE 5 MG/5ML IV SOLN: 2.5000 mg | INTRAVENOUS | Status: DC | PRN

## 2024-07-24 NOTE — Care Management Obs Status (Signed)
 MEDICARE OBSERVATION STATUS NOTIFICATION   Patient Details  Name: [name] [name] MRN: [id number] Date of Birth: January 26, 1932   Medicare Observation Status Notification Given:  Yes    Erian Rosengren W, CMA 07/24/2024, 10:18 AM

## 2024-07-24 NOTE — Progress Notes (Signed)
 Patient seen in the emergency room-recuperating fairly well after severe anaphylactic reaction.  Hemoglobin stable. Off any anticoagulation.  Patient has PET scan planned on the 12th and follow-up with me on the 18th.  Awaiting EUS? 15th.   GB

## 2024-07-24 NOTE — Telephone Encounter (Signed)
 [facility] Pre-operative Risk Assessment     Request for surgical clearance:     Endoscopy Procedure  What type of surgery is being performed?     endoscopy  When is this surgery scheduled?     08/13/24  What type of clearance is required ?   Pharmacy  Are there any medications that need to be held prior to surgery and how long? Eliquis   Practice name and name of physician performing surgery?      [facility]  What is your office phone and fax number?      Phone- [phone]  Fax- [phone]  Anesthesia type (None, local, MAC, general) ?       MAC   Please route your response to Odetta Curly RN

## 2024-07-24 NOTE — ED Notes (Signed)
 IV accidentally removed by pt. Site cleaned and dressed. Pt incontinent of urine. Bed linens changed and fresh brief and gown placed on pt. Pt denies any other needs at this time.

## 2024-07-24 NOTE — Hospital Course (Addendum)
 Partly taken from H&P.  [name] [name] is a 89 y.o. 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   Patient was recently hospitalized from 8/14 - 8/20 due to UGIB.  Patient was transfused with 1 unit of blood.  CT scan showed a large mass within the lumen of the stomach. Pt is s/p EGD. Gastric ulcer and bleeding vessel was cauterized by GI. EGD biopsy was negative/inconclusive. Pt is scheduled to have EUS biopsy with Dr. Wilhelmenia of GI in Sept 2025.   Pt was in cancer center to have received IV Venofer  infusion due to iron  deficiency anemia today.  After finishing transfusion, pt developed severe anaphylactic reaction to IV iron , required chest compression and epi injection, no shock delivered.  Patient also received Benadryl  and steroid. Per husband, during the event, patient had hand shaking and syncopized.  Later in ED she was having frontal chest wall tenderness.  Denies any obvious bleeding or dark-colored stools.  On presentation patient was tachycardic, BP 154/81, labs mostly stable, troponin50>> 137>>120-likely demand ischemia. CT head was negative for any acute intracranial abnormality. CXR with left base subsegmental atelectasis, mildly enlarged cardiac silhouette and aortic atherosclerosis.  EKG with A-fib, QTc 519, right bundle branch block, early R wave progression and Q waves in lead III and aVF.  Patient was initially started on heparin  infusion, it was later discontinued as oncology and cardiology both agreed that risk outweighs the benefit, patient is very high risk for bleeding due to large gastric mass and they are recommending discontinuing of anticoagulation at this time.  8/26: Vital stable, improving troponin now at 51, hemoglobin stable at 9.5, UA with protein urea, blood  glucose mildly elevated and rest of the labs stable.  Continue to have mild to moderate chest wall tenderness and pain with deep breathing, likely secondary to CPR.  No bony injuries noted on chest x-ray.  Patient was given some Voltaren  gel and she can also use Tylenol  as needed.  Lidocaine  patch allergy  was noted.  Her home Eliquis  was held as advised by her oncologist and confirmed by cardiology.  They will resume when appropriate.  Patient has outpatient procedure scheduled for gastric mass on 08/13/2024.  She will continue the rest of her home medications and follow-up with her providers closely for further assistance.

## 2024-07-24 NOTE — Telephone Encounter (Signed)
 EUS scheduled, pt instructed and medications reviewed.  Patient instructions mailed to home.  Patient to call with any questions or concerns.  Eliquis  letter has been sent to cardiology

## 2024-07-24 NOTE — Discharge Summary (Signed)
 Physician Discharge Summary   Patient: [name] [name] MRN: [id number] DOB: 01/30/1932  Admit date:     07/23/2024  Discharge date: 07/24/24  Discharge Physician: Amaryllis Dare   PCP: Gretta Comer POUR, NP   Recommendations at discharge:  Please obtain CBC and BMP and follow-up Follow-up with oncology Follow-up with cardiology Follow-up with gastroenterology for procedure according to the schedule  Discharge Diagnoses: Principal Problem:   Anaphylaxis to venofer  Active Problems:   CAD (coronary artery disease)   Myocardial injury   Persistent atrial fibrillation (HCC)   Chronic diastolic CHF (congestive heart failure) (HCC)   DVT (deep venous thrombosis) (HCC)   Iron  deficiency anemia   Mixed hyperlipidemia   Essential hypertension   Type 2 diabetes mellitus with chronic kidney disease (HCC)   Gastric mass   Chronic kidney disease, stage 3b (HCC)   Hypokalemia   Anxiety and depression   Hospital Course: Partly taken from H&P.  NASHALY [name] is a 89 y.o. 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   Patient was recently hospitalized from 8/14 - 8/20 due to UGIB.  Patient was transfused with 1 unit of blood.  CT scan showed a large mass within the lumen of the stomach. Pt is s/p EGD. Gastric ulcer and bleeding vessel was cauterized by GI. EGD biopsy was negative/inconclusive. Pt is scheduled to have EUS biopsy with Dr. Wilhelmenia of GI in Sept 2025.   Pt was in cancer center to have received IV Venofer  infusion due to iron  deficiency anemia today.  After finishing transfusion, pt developed severe anaphylactic reaction to IV iron , required chest compression and epi injection, no shock delivered.  Patient also received Benadryl  and steroid. Per husband, during the event, patient  had hand shaking and syncopized.  Later in ED she was having frontal chest wall tenderness.  Denies any obvious bleeding or dark-colored stools.  On presentation patient was tachycardic, BP 154/81, labs mostly stable, troponin50>> 137>>120-likely demand ischemia. CT head was negative for any acute intracranial abnormality. CXR with left base subsegmental atelectasis, mildly enlarged cardiac silhouette and aortic atherosclerosis.  EKG with A-fib, QTc 519, right bundle branch block, early R wave progression and Q waves in lead III and aVF.  Patient was initially started on heparin  infusion, it was later discontinued as oncology and cardiology both agreed that risk outweighs the benefit, patient is very high risk for bleeding due to large gastric mass and they are recommending discontinuing of anticoagulation at this time.  8/26: Vital stable, improving troponin now at 51, hemoglobin stable at 9.5, UA with protein urea, blood glucose mildly elevated and rest of the labs stable.  Continue to have mild to moderate chest wall tenderness and pain with deep breathing, likely secondary to CPR.  No bony injuries noted on chest x-ray.  Patient was given some Voltaren  gel and she can also use Tylenol  as needed.  Lidocaine  patch allergy  was noted.  Her home Eliquis  was held as advised by her oncologist and confirmed by cardiology.  They will resume when appropriate.  Patient has outpatient procedure scheduled for gastric mass on 08/13/2024.  She will continue the rest of her home medications and follow-up with her providers closely for further assistance.   Consultants: Curbside oncology and cardiology Procedures performed: None Disposition: Home Diet recommendation:  Discharge Diet Orders (From admission, onward)     Start     Ordered   07/24/24 0000  Diet - low sodium heart healthy        07/24/24 1025           Cardiac and Carb modified diet DISCHARGE MEDICATION: Allergies as of 07/24/2024        Reactions   Other Rash, Anaphylaxis   Shellfish Allergy  Anaphylaxis, Rash   Venofer  [iron  Sucrose] Anaphylaxis   Severe anaphylactic reaction to Venofer -needing chest compressions; EpiPen .    Cephalexin     REACTION: trash and swelling   Ciprofloxacin    REACTION: u/k   Clopidogrel Bisulfate    REACTION: swelling, rash   Ezetimibe -simvastatin    REACTION: myalgias   Lidocaine  Hives   ONLY TO ADHESIVE LIDOCAINE  PATCHES. NO PROBLEM WITH INJECTABLE LIDOCAINE .   Nitrofurantoin    REACTION: panama   Pregabalin    REACTION: felt bad   Celecoxib Rash   REACTION: panama   Codeine Rash   Codeine Phosphate Rash   Hydrocod Poli-chlorphe Poli Er Rash   Propoxyphene Rash   Propoxyphene N-acetaminophen  Rash   Rofecoxib Rash   Sulfa Antibiotics Rash   Sulfamethoxazole Rash   Sulfonamide Derivatives Rash        Medication List     PAUSE taking these medications    apixaban  5 MG Tabs tablet Wait to take this until your doctor or other care provider tells you to start again. Commonly known as: Eliquis  Take 1 tablet (5 mg total) by mouth 2 (two) times daily.       STOP taking these medications    isosorbide  mononitrate 60 MG 24 hr tablet Commonly known as: IMDUR        TAKE these medications    Blood Glucose Monitoring Suppl Devi 1 each by Does not apply route in the morning, at noon, and at bedtime. May substitute to any manufacturer covered by patient's insurance.   BLOOD GLUCOSE TEST STRIPS Strp 1 each by In Vitro route in the morning, at noon, and at bedtime. May substitute to any manufacturer covered by patient's insurance.   brimonidine  0.2 % ophthalmic solution Commonly known as: ALPHAGAN  Place 1 drop into the right eye in the morning and at bedtime.   CRANBERRY PO Take 1 tablet by mouth daily.   cyanocobalamin  1000 MCG tablet Commonly known as: VITAMIN B12 Take 1,000 mcg by mouth daily.   diclofenac  Sodium 1 % Gel Commonly known as: Voltaren  Apply 4 g  topically 4 (four) times daily. To your chest wall   ezetimibe  10 MG tablet Commonly known as: ZETIA  Take 1 tablet (10 mg total) by mouth daily.   ferrous sulfate  325 (65 FE) MG tablet Take 1 tablet (325 mg total) by mouth daily with breakfast.   fexofenadine  180 MG tablet Commonly known as: ALLEGRA  Take 1 tablet (180 mg total) by mouth as needed for allergies or rhinitis.   furosemide  20 MG tablet Commonly known as: Lasix  Take 1 tablet by mouth daily. May take additional tablet as needed for swelling   gabapentin  100 MG capsule Commonly known as: NEURONTIN  Take 200 mg by mouth 2 (two) times daily.   Lancets Misc. Misc 1 each by Does not apply route in the morning, at noon, and at bedtime. May substitute to any manufacturer covered by patient's insurance.   metoprolol  succinate 100 MG 24 hr tablet Commonly known as: TOPROL -XL TAKE 1 TABLET BY MOUTH EVERY MORNING WITH OR IMMEDIATELY FOLLOWING A  MEAL   Nexlizet  180-10 MG Tabs Generic drug: Bempedoic Acid -Ezetimibe  Take 1 tablet by mouth daily. Pt has scheduled an office visit with provider in April, 2025   nitroGLYCERIN  0.4 MG SL tablet Commonly known as: NITROSTAT  Place 1 tablet (0.4 mg total) under the tongue every 5 (five) minutes as needed for chest pain.   ondansetron  4 MG disintegrating tablet Commonly known as: ZOFRAN -ODT Take 1 tablet (4 mg total) by mouth every 8 (eight) hours as needed for nausea or vomiting.   pantoprazole  40 MG tablet Commonly known as: PROTONIX  Take 1 tablet (40 mg total) by mouth 2 (two) times daily. TAKE 1 TABLET BY MOUTH DAILY FOR HEARTBURN   sertraline  100 MG tablet Commonly known as: ZOLOFT  TAKE 1 TABLET BY MOUTH DAILY FOR ANXIETY AND DEPRESSION   Trulicity  0.75 MG/0.5ML Soaj Generic drug: Dulaglutide  Inject 0.75 mg into the skin once a week. for diabetes.   Vitamin D -3 25 MCG (1000 UT) Caps Take 1 capsule by mouth daily.        Follow-up Information     Gretta Comer POUR,  NP. Schedule an appointment as soon as possible for a visit in 1 week(s).   Specialty: Internal Medicine Contact information: [address] [place] [address] [phone]         Rennie Cindy SAUNDERS, MD. Schedule an appointment as soon as possible for a visit.   Specialties: Internal Medicine, Oncology Contact information: [address] [place] [address] [phone]         Perla Evalene PARAS, MD. Schedule an appointment as soon as possible for a visit.   Specialty: Cardiology Contact information: [address] [place] [address] [phone]                Discharge Exam: There were no vitals filed for this visit. General.  Frail elderly lady, in no acute distress. Pulmonary.  Lungs clear bilaterally, normal respiratory effort. CV.  Irregularly irregular Abdomen.  Soft, nontender, nondistended, BS positive. CNS.  Alert and oriented .  No focal neurologic deficit. Extremities.  No edema, no cyanosis, pulses intact and symmetrical. Psychiatry.  Judgment and insight appears normal.   Condition at discharge: stable  The results of significant diagnostics from this hospitalization (including imaging, microbiology, ancillary and laboratory) are listed below for reference.   Imaging Studies: CT HEAD WO CONTRAST ( ) Result Date: 07/23/2024 CLINICAL DATA:  Syncope, anticoagulation, evaluate for intracranial hemorrhage EXAM: CT HEAD WITHOUT CONTRAST TECHNIQUE: Contiguous axial images were obtained from the base of the skull through the vertex without intravenous contrast. RADIATION DOSE REDUCTION: This exam was performed according to the departmental dose-optimization program which includes automated exposure control, adjustment of the mA and/or kV according to patient size and/or use of iterative reconstruction technique. COMPARISON:  10/09/2023 FINDINGS: Brain: No evidence of acute infarction, hemorrhage, hydrocephalus, extra-axial  collection or mass lesion/mass effect. Vascular: No hyperdense vessel or unexpected calcification. Skull: Normal. Negative for fracture or focal lesion. Sinuses/Orbits: No acute finding. Chronic mucosal thickening and bony sinus wall thickening of the partially imaged maxillary sinuses. Other: None. IMPRESSION: 1. No acute intracranial pathology. 2. Chronic sinusitis, partially imaged. Electronically Signed   By: Marolyn JONETTA Jaksch M.D.   On: 07/23/2024 19:11   DG Chest Portable 1 View Result Date: 07/23/2024 EXAM: 1 VIEW XRAY OF THE CHEST 07/23/2024 04:20:00 PM COMPARISON: 11/20/2022 CLINICAL HISTORY: Cardiac arrest? Got brief chest compressions. FINDINGS: LUNGS AND PLEURA: No significant pleural effusion or pneumothorax. Subsegmental atelectasis  versus scar in the left base. No airspace consolidation. HEART AND MEDIASTINUM: Mild enlargement of the cardiac silhouette. Aortic atherosclerotic calcification. BONES AND SOFT TISSUES: Visualized osseous structures appear grossly intact. Unchanged asymmetric elevation of the right hemidiaphragm. IMPRESSION: 1. Left base subsegmental atelectasis. 2. Aortic atherosclerosis and mild enlargement of the cardiac silhouette, Electronically signed by: Waddell Calk MD 07/23/2024 04:48 PM EDT RP Workstation: [id number]   US  Venous Img Upper Uni Right(DVT) Result Date: 07/19/2024 CLINICAL DATA:  Right upper arm pain and edema. Prior right arm IV placement. EXAM: RIGHT UPPER EXTREMITY VENOUS DOPPLER ULTRASOUND TECHNIQUE: Gray-scale sonography with graded compression, as well as color Doppler and duplex ultrasound were performed to evaluate the upper extremity deep venous system from the level of the subclavian vein and including the jugular, axillary, basilic, radial, ulnar and upper cephalic vein. Spectral Doppler was utilized to evaluate flow at rest and with distal augmentation maneuvers. COMPARISON:  None Available. FINDINGS: Contralateral Subclavian Vein: Respiratory phasicity  is normal and symmetric with the symptomatic side. No evidence of thrombus. Normal compressibility. Internal Jugular Vein: No evidence of thrombus. Normal compressibility, respiratory phasicity and response to augmentation. Subclavian Vein: No evidence of thrombus. Normal compressibility, respiratory phasicity and response to augmentation. Axillary Vein: No evidence of thrombus. Normal compressibility, respiratory phasicity and response to augmentation. Cephalic Vein: Superficial thrombophlebitis of the right cephalic vein with occlusive thrombus present from the level of the proximal forearm to the mid upper arm. Basilic Vein: No evidence of thrombus. Normal compressibility, respiratory phasicity and response to augmentation. Brachial Veins: No evidence of thrombus. Normal compressibility, respiratory phasicity and response to augmentation. Radial Veins: No evidence of thrombus. Normal compressibility, respiratory phasicity and response to augmentation. Ulnar Veins: No evidence of thrombus. Normal compressibility, respiratory phasicity and response to augmentation. Venous Reflux:  None visualized. Other Findings:  No abnormal fluid collections. IMPRESSION: 1. Superficial thrombophlebitis of the right cephalic vein from the level of the proximal forearm to the mid upper arm. 2. No evidence of DVT within the right upper extremity. Electronically Signed   By: Marcey Moan M.D.   On: 07/19/2024 08:25   CT CHEST ABDOMEN PELVIS W CONTRAST Result Date: 07/13/2024 CLINICAL DATA:  Metastatic disease evaluation. * Tracking Code: BO *. EXAM: CT CHEST, ABDOMEN, AND PELVIS WITH CONTRAST TECHNIQUE: Multidetector CT imaging of the chest, abdomen and pelvis was performed following the standard protocol during bolus administration of intravenous contrast. RADIATION DOSE REDUCTION: This exam was performed according to the departmental dose-optimization program which includes automated exposure control, adjustment of the mA  and/or kV according to patient size and/or use of iterative reconstruction technique. CONTRAST:  80mL OMNIPAQUE  IOHEXOL  300 MG/ML  SOLN COMPARISON:  CT chest 08/09/2013 FINDINGS: CT CHEST FINDINGS Cardiovascular: No significant vascular findings. Normal heart size. No pericardial effusion. Mediastinum/Nodes: No axillary or supraclavicular adenopathy. No mediastinal or hilar adenopathy. No pericardial fluid. Esophagus normal. Lungs/Pleura: 10 mm nodule in the RIGHT lower lobe (image 45/2) is new from 2014 Musculoskeletal: No aggressive osseous lesion. CT ABDOMEN AND PELVIS FINDINGS Hepatobiliary: No focal hepatic lesion. No biliary ductal dilatation. Gallbladder is normal. Common bile duct is normal. Pancreas: Pancreas is normal. No ductal dilatation. No pancreatic inflammation. Spleen: Normal spleen Adrenals/urinary tract: Adrenal glands and kidneys are normal. The ureters and bladder normal. Stomach/Bowel: Large round well-circumscribed mass in the mid stomach measures 6.3 x 6.3 cm (image 63) mass is contained within lumen of the stomach. No enlarged lymph nodes in the gastrohepatic ligament. The duodenum, small-bowel colon normal. Vascular/Lymphatic:  Abdominal aorta is normal caliber with atherosclerotic calcification. There is no retroperitoneal or periportal lymphadenopathy. No pelvic lymphadenopathy. Reproductive: Unremarkable Other: No peritoneal or omental metastasis Musculoskeletal: No aggressive osseous lesion. IMPRESSION: CHEST: 1. Solitary pulmonary nodule in the RIGHT lower lobe is new from 2014. Indeterminate finding. FDG PET scan may aid in determining malignant potential. 2. No mediastinal adenopathy. PELVIS: 1. Large mass within the lumen of the stomach. 2. No evidence of metastatic adenopathy in the abdomen pelvis. 3. No liver metastasis. 4. No peritoneal metastasis. 5.  Aortic Atherosclerosis (ICD10-I70.0). Electronically Signed   By: Jackquline Boxer M.D.   On: 07/13/2024 17:35     Microbiology: Results for orders placed or performed during the hospital encounter of 06/06/24  Eye culture w Gram Stain     Status: Abnormal   Collection Time: 06/06/24 10:30 AM   Specimen: Eye  Result Value Ref Range Status   Specimen Description   Final    EYE Performed at [facility], [address], [place], [place] [address]    Special Requests   Final    RIGHT EYE CULTURE Performed at [facility], [address] [place], [place] [address]    Gram Stain   Final    ABUNDANT WBC PRESENT, PREDOMINANTLY PMN RARE GRAM NEGATIVE RODS    Culture (A)  Final    MORAXELLA NONLIQUEFACIENS BETA LACTAMASE POSITIVE Performed at [facility], [address] [place], [place] [address]    Report Status 06/11/2024 FINAL  Final   *Note: Due to a large number of results and/or encounters for the requested time period, some results have not been displayed. A complete set of results can be found in Results Review.    Labs: CBC: Recent Labs  Lab 07/18/24 0546 07/23/24 1345 07/23/24 1610 07/23/24 2153 07/24/24 0156  WBC 4.6 3.9* 5.6 4.2 4.1  NEUTROABS  --  2.3 2.8  --   --   HGB 8.7* 9.5* 9.4* 9.6* 9.5*  HCT 26.4* 29.6* 30.2* 29.8* 29.8*  MCV 97.8 99.7 101.0* 99.0 101.7*  PLT 203 283 306 274 272   Basic Metabolic Panel: Recent Labs  Lab 07/18/24 0546 07/23/24 1345 07/23/24 1610 07/24/24 0636  NA 137 137 138 140  K 3.5 3.9 3.1* 4.8  CL 105 104 104 108  CO2 26 25 24 27   GLUCOSE 139* 189* 160* 222*  BUN 11 10 10 13   CREATININE 1.06* 1.14* 1.09* 1.01*  CALCIUM 8.1* 8.6* 8.4* 8.6*  MG  --   --  1.8  --    Liver Function Tests: Recent Labs  Lab 07/23/24 1345 07/23/24 1610  AST 20 25  ALT 12 12  ALKPHOS 39 38  BILITOT 0.7 0.9  PROT 6.2* 6.6  ALBUMIN 3.5 3.5   CBG: Recent Labs  Lab 07/18/24 0729 07/18/24 1215 07/18/24 1501 07/23/24 2115 07/24/24 0830  GLUCAP 156* 220* 188* 254* 214*    Discharge time spent: greater than 30  minutes.  This record has been created using Conservation officer, historic buildings. Errors have been sought and corrected,but may not always be located. Such creation errors do not reflect on the standard of care.   Signed: Amaryllis Dare, MD [facility] 07/24/2024

## 2024-07-25 ENCOUNTER — Telehealth: Payer: Self-pay

## 2024-07-25 NOTE — Telephone Encounter (Signed)
 Preop tele appt now scheduled, med rec and consent done.

## 2024-07-25 NOTE — Telephone Encounter (Signed)
  Patient Consent for Virtual Visit        [name] [name] has provided verbal consent on 07/25/2024 for a virtual visit (video or telephone).   CONSENT FOR VIRTUAL VISIT FOR:  [name] [name]  By participating in this virtual visit I agree to the following:  I hereby voluntarily request, consent and authorize [facility] and its employed or contracted physicians, physician assistants, nurse practitioners or other licensed health care professionals (the Practitioner), to provide me with telemedicine health care services (the "Services) as deemed necessary by the treating Practitioner. I acknowledge and consent to receive the Services by the Practitioner via telemedicine. I understand that the telemedicine visit will involve communicating with the Practitioner through live audiovisual communication technology and the disclosure of certain medical information by electronic transmission. I acknowledge that I have been given the opportunity to request an in-person assessment or other available alternative prior to the telemedicine visit and am voluntarily participating in the telemedicine visit.  I understand that I have the right to withhold or withdraw my consent to the use of telemedicine in the course of my care at any time, without affecting my right to future care or treatment, and that the Practitioner or I may terminate the telemedicine visit at any time. I understand that I have the right to inspect all information obtained and/or recorded in the course of the telemedicine visit and may receive copies of available information for a reasonable fee.  I understand that some of the potential risks of receiving the Services via telemedicine include:  Delay or interruption in medical evaluation due to technological equipment failure or disruption; Information transmitted may not be sufficient (e.g. poor resolution of images) to allow for appropriate medical decision making by the Practitioner;  and/or  In rare instances, security protocols could fail, causing a breach of personal health information.  Furthermore, I acknowledge that it is my responsibility to provide information about my medical history, conditions and care that is complete and accurate to the best of my ability. I acknowledge that Practitioner's advice, recommendations, and/or decision may be based on factors not within their control, such as incomplete or inaccurate data provided by me or distortions of diagnostic images or specimens that may result from electronic transmissions. I understand that the practice of medicine is not an exact science and that Practitioner makes no warranties or guarantees regarding treatment outcomes. I acknowledge that a copy of this consent can be made available to me via my patient portal [facility] MyChart), or I can request a printed copy by calling the office of [facility].    I understand that my insurance will be billed for this visit.   I have read or had this consent read to me. I understand the contents of this consent, which adequately explains the benefits and risks of the Services being provided via telemedicine.  I have been provided ample opportunity to ask questions regarding this consent and the Services and have had my questions answered to my satisfaction. I give my informed consent for the services to be provided through the use of telemedicine in my medical care

## 2024-07-25 NOTE — Telephone Encounter (Signed)
   Name: [name] [name]  DOB: [birth year]  MRN: [id number]  Primary Cardiologist: Lonni Cash, MD   Preoperative team, please contact this patient and set up a phone call appointment for further preoperative risk assessment. Please obtain consent and complete medication review. Thank you for your help.  I confirm that guidance regarding antiplatelet and oral anticoagulation therapy has been completed and, if necessary, noted below.  Per office protocol, patient can hold Eliquis  for 2 days prior to procedure.    I also confirmed the patient resides in the state of [place] . As per [facility] [employer] telemedicine laws, the patient must reside in the state in which the provider is licensed.   Wyn Raddle, Jackee Shove, NP 07/25/2024, 11:58 AM [facility]

## 2024-07-25 NOTE — Telephone Encounter (Signed)
 Patient with diagnosis of A Fib on Eliquis  for anticoagulation.    Procedure: endoscopy Date of procedure: 08/13/24   CHA2DS2-VASc Score = 7  This indicates a 11.2% annual risk of stroke. The patient's score is based upon: CHF History: 1 HTN History: 1 Diabetes History: 1 Stroke History: 0 Vascular Disease History: 1 Age Score: 2 Gender Score: 1    CrCl 44 ml/min Platelet count 272K  Patient has not had an Afib/aflutter ablation within the last 3 months or DCCV within the last 30 days  Per office protocol, patient can hold Eliquis  for 2 days prior to procedure.    **This guidance is not considered finalized until pre-operative APP has relayed final recommendations.**

## 2024-07-26 ENCOUNTER — Telehealth: Payer: Self-pay

## 2024-07-26 DIAGNOSIS — H16001 Unspecified corneal ulcer, right eye: Secondary | ICD-10-CM | POA: Diagnosis not present

## 2024-07-26 DIAGNOSIS — H169 Unspecified keratitis: Secondary | ICD-10-CM | POA: Diagnosis not present

## 2024-07-26 NOTE — Transitions of Care (Post Inpatient/ED Visit) (Unsigned)
   07/26/2024  Name: [name] [name] MRN: [id number] DOB: 11-Sep-1932  Today's TOC FU Call Status: Today's TOC FU Call Status:: Unsuccessful Call (1st Attempt) Unsuccessful Call (1st Attempt) Date: 07/26/24  Attempted to reach the patient regarding the most recent Inpatient/ED visit.  Follow Up Plan: Additional outreach attempts will be made to reach the patient to complete the Transitions of Care (Post Inpatient/ED visit) call.   Signature Julian Lemmings, LPN [facility] Nurse Health Advisor Direct Dial [phone]

## 2024-07-27 NOTE — Transitions of Care (Post Inpatient/ED Visit) (Signed)
 07/27/2024  Name: [name] [name] MRN: [id number] DOB: Mar 21, 1932  Today's TOC FU Call Status: Today's TOC FU Call Status:: Successful TOC FU Call Completed Unsuccessful Call (1st Attempt) Date: 07/26/24 [facility] FU Call Complete Date: 07/27/24 Patient's Name and Date of Birth confirmed.  Transition Care Management Follow-up Telephone Call Date of Discharge: 07/24/24 Discharge Facility: [facility]) Type of Discharge: Inpatient Admission Primary Inpatient Discharge Diagnosis:: chest pain How have you been since you were released from the hospital?: Better Any questions or concerns?: No  Items Reviewed: Did you receive and understand the discharge instructions provided?: Yes Medications obtained,verified, and reconciled?: Yes (Medications Reviewed) Any new allergies since your discharge?: No Dietary orders reviewed?: Yes Do you have support at home?: Yes People in Home [RPT]: child(ren), adult  Medications Reviewed Today: Medications Reviewed Today     Reviewed by Emmitt Pan, LPN (Licensed Practical Nurse) on 07/27/24 at 1147  Med List Status: <None>   Medication Order Taking? Sig Documenting Provider Last Dose Status Informant  apixaban  (ELIQUIS ) 5 MG TABS tablet [id number]  Take 1 tablet (5 mg total) by mouth 2 (two) times daily.  Patient not taking: Reported on 07/27/2024   Gollan, Timothy J, MD  Active Self  Bempedoic Acid -Ezetimibe  (NEXLIZET ) 180-10 MG TABS [id number] Yes Take 1 tablet by mouth daily. Pt has scheduled an office visit with provider in April, 2025 Verlin Lonni BIRCH, MD  Active Self  Blood Glucose Monitoring Suppl DEVI [id number] Yes 1 each by Does not apply route in the morning, at noon, and at bedtime. May substitute to any manufacturer covered by patient's insurance. [name], Katherine K, NP  Active Self  brimonidine  (ALPHAGAN ) 0.2 % ophthalmic solution [id number] Yes Place 1 drop into the right eye in the morning and at bedtime.  [provider]  Active Self  Cholecalciferol  (VITAMIN D -3) 1000 UNITS CAPS [id number] Yes Take 1 capsule by mouth daily. [provider]  Active Self  CRANBERRY PO [id number] Yes Take 1 tablet by mouth daily. [provider]  Active Self  diclofenac  Sodium (VOLTAREN ) 1 % GEL [id number] Yes Apply 4 g topically 4 (four) times daily. To your chest wall Caleen Qualia, MD  Active   Dulaglutide  (TRULICITY ) 0.75 MG/0.5ML EMMANUEL [id number] Yes Inject 0.75 mg into the skin once a week. for diabetes. [name], Katherine K, NP  Active Self           Med Note ZENA NATHANAEL LITTIE Charlotte Jul 12, 2024  7:01 PM) Every Tuesday  ezetimibe  (ZETIA ) 10 MG tablet [id number] Yes Take 1 tablet (10 mg total) by mouth daily. Caleen Qualia, MD  Active   ferrous sulfate  325 (65 FE) MG tablet [id number] Yes Take 1 tablet (325 mg total) by mouth daily with breakfast. Kerman Vina HERO, NP  Active Self  fexofenadine  (ALLEGRA ) 180 MG tablet [id number] Yes Take 1 tablet (180 mg total) by mouth as needed for allergies or rhinitis. Gretta Comer POUR, NP  Active Self           Med Note CATHY OVAL DEL   Thu Jul 12, 2024  6:56 PM)    furosemide  (LASIX ) 20 MG tablet [id number] Yes Take 1 tablet by mouth daily. May take additional tablet as needed for swelling Perla Evalene PARAS, MD  Active Self  gabapentin  (NEURONTIN ) 100 MG capsule [id number] Yes Take 200 mg by mouth 2 (two) times daily. [provider]  Active Self  Glucose Blood (BLOOD GLUCOSE TEST STRIPS) STRP [id number] Yes 1  each by In Vitro route in the morning, at noon, and at bedtime. May substitute to any manufacturer covered by patient's insurance. Gretta Comer POUR, NP  Active Self  Lancets Misc. MISC [id number] Yes 1 each by Does not apply route in the morning, at noon, and at bedtime. May substitute to any manufacturer covered by patient's insurance. Gretta Comer POUR, NP  Active Self    Discontinued 02/15/12 0800   metoprolol  succinate (TOPROL -XL)  100 MG 24 hr tablet [id number] Yes TAKE 1 TABLET BY MOUTH EVERY MORNING WITH OR IMMEDIATELY FOLLOWING A MEAL Gollan, Timothy J, MD  Active Self  nitroGLYCERIN  (NITROSTAT ) 0.4 MG SL tablet [id number] Yes Place 1 tablet (0.4 mg total) under the tongue every 5 (five) minutes as needed for chest pain. Gollan, Timothy J, MD  Active Self  ondansetron  (ZOFRAN -ODT) 4 MG disintegrating tablet [id number] Yes Take 1 tablet (4 mg total) by mouth every 8 (eight) hours as needed for nausea or vomiting. [name], Katherine K, NP  Active Self  pantoprazole  (PROTONIX ) 40 MG tablet [id number] Yes Take 1 tablet (40 mg total) by mouth 2 (two) times daily. TAKE 1 TABLET BY MOUTH DAILY FOR MARLINDA Pepper, Alban, MD  Active Self  sertraline  (ZOLOFT ) 100 MG tablet [id number] Yes TAKE 1 TABLET BY MOUTH DAILY FOR ANXIETY AND DEPRESSION [name], Katherine K, NP  Active Self  vitamin B-12 (CYANOCOBALAMIN ) 1000 MCG tablet [id number] Yes Take 1,000 mcg by mouth daily. [provider]  Active Self            Home Care and Equipment/Supplies: Were Home Health Services Ordered?: NA Any new equipment or medical supplies ordered?: NA  Functional Questionnaire: Do you need assistance with bathing/showering or dressing?: No Do you need assistance with meal preparation?: No Do you need assistance with eating?: No Do you have difficulty maintaining continence: No Do you need assistance with getting out of bed/getting out of a chair/moving?: No Do you have difficulty managing or taking your medications?: No  Follow up appointments reviewed: PCP Follow-up appointment confirmed?: No MD Provider Line Number:[phone] Given: No Specialist Hospital Follow-up appointment confirmed?: Yes Date of Specialist follow-up appointment?: 07/31/24 Follow-Up Specialty Provider:: cardio Do you need transportation to your follow-up appointment?: No Do you understand care options if your condition(s) worsen?: Yes-patient verbalized  understanding    SIGNATURE Julian Lemmings, LPN [facility] Nurse Health Advisor Direct Dial [phone]

## 2024-07-31 ENCOUNTER — Other Ambulatory Visit (INDEPENDENT_AMBULATORY_CARE_PROVIDER_SITE_OTHER)

## 2024-07-31 ENCOUNTER — Telehealth: Payer: Self-pay

## 2024-07-31 ENCOUNTER — Ambulatory Visit: Admitting: Physician Assistant

## 2024-07-31 ENCOUNTER — Encounter: Payer: Self-pay | Admitting: Physician Assistant

## 2024-07-31 VITALS — BP 124/70 | HR 92 | Ht 69.0 in | Wt 162.2 lb

## 2024-07-31 DIAGNOSIS — D490 Neoplasm of unspecified behavior of digestive system: Secondary | ICD-10-CM

## 2024-07-31 DIAGNOSIS — D62 Acute posthemorrhagic anemia: Secondary | ICD-10-CM

## 2024-07-31 DIAGNOSIS — K250 Acute gastric ulcer with hemorrhage: Secondary | ICD-10-CM | POA: Diagnosis not present

## 2024-07-31 DIAGNOSIS — D509 Iron deficiency anemia, unspecified: Secondary | ICD-10-CM

## 2024-07-31 DIAGNOSIS — E538 Deficiency of other specified B group vitamins: Secondary | ICD-10-CM

## 2024-07-31 LAB — CBC WITH DIFFERENTIAL/PLATELET
Basophils Absolute: 0 K/uL (ref 0.0–0.1)
Basophils Relative: 0.4 % (ref 0.0–3.0)
Eosinophils Absolute: 0.1 K/uL (ref 0.0–0.7)
Eosinophils Relative: 1.6 % (ref 0.0–5.0)
HCT: 36.2 % (ref 36.0–46.0)
Hemoglobin: 12 g/dL (ref 12.0–15.0)
Lymphocytes Relative: 26.7 % (ref 12.0–46.0)
Lymphs Abs: 1.1 K/uL (ref 0.7–4.0)
MCHC: 33.2 g/dL (ref 30.0–36.0)
MCV: 98.6 fl (ref 78.0–100.0)
Monocytes Absolute: 0.4 K/uL (ref 0.1–1.0)
Monocytes Relative: 9.4 % (ref 3.0–12.0)
Neutro Abs: 2.6 K/uL (ref 1.4–7.7)
Neutrophils Relative %: 61.9 % (ref 43.0–77.0)
Platelets: 350 K/uL (ref 150.0–400.0)
RBC: 3.67 Mil/uL — ABNORMAL LOW (ref 3.87–5.11)
RDW: 19.9 % — ABNORMAL HIGH (ref 11.5–15.5)
WBC: 4.3 K/uL (ref 4.0–10.5)

## 2024-07-31 LAB — B12 AND FOLATE PANEL
Folate: 12.2 ng/mL (ref >5.9)
Vitamin B-12: >1500 pg/mL — ABNORMAL HIGH (ref 211–911)

## 2024-07-31 NOTE — Progress Notes (Signed)
 Attending Physician's Attestation   I have reviewed the chart.   I agree with the Advanced Practitioner's note, impression, and recommendations with any updates as below. 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  FNB needs to be performed to clarify diagnosis, because of the size of this lesion, she may need neoadjuvant Gleevac if this is a GIST before surgical options (especially based on her age).  I tried to see if I can expedite her case for this Thursday, but as a result of Trulicity  being in her system she would likely would need an intubation after discussion with anesthesia, and with her just recently having had anaphylactic reaction and requiring CPR, I really would like to try to minimize sedation and medicines for her if possible, for her procedure.  Thus we will move her case to 9/11, and my team will work on discussing this with the patient tomorrow (call patient's home/mobile number after 9 AM) to tell her exact time of presentation.  She will hold Trulicity  next week.  She will not restart blood thinner as per her last hospitalization discussions.   Aloha Finner, MD [facility] Advanced Endoscopy Office # [phone]

## 2024-07-31 NOTE — Patient Instructions (Addendum)
 Your provider has requested that you go to the basement level for lab work before leaving today. Press B on the elevator. The lab is located at the first door on the left as you exit the elevator.  Please follow up sooner if symptoms increase or worsen  Due to recent changes in healthcare laws, you may see the results of your imaging and laboratory studies on MyChart before your provider has had a chance to review them.  We understand that in some cases there may be results that are confusing or concerning to you. Not all laboratory results come back in the same time frame and the provider may be waiting for multiple results in order to interpret others.  Please give us  48 hours in order for your provider to thoroughly review all the results before contacting the office for clarification of your results.   Thank you for trusting me with your gastrointestinal care!   Ellouise Console, PA-C _______________________________________________________  If your blood pressure at your visit was 140/90 or greater, please contact your primary care physician to follow up on this.  _______________________________________________________  If you are age 55 or older, your body mass index should be between 23-30. Your Body mass index is 23.96 kg/m. If this is out of the aforementioned range listed, please consider follow up with your Primary Care Provider.  If you are age 10 or younger, your body mass index should be between 19-25. Your Body mass index is 23.96 kg/m. If this is out of the aformentioned range listed, please consider follow up with your Primary Care Provider.   ________________________________________________________  The [facility] providers would like to encourage you to use MYCHART to communicate with providers for non-urgent requests or questions.  Due to long hold times on the telephone, sending your provider a message by [facility] may be a faster and more efficient way to get a response.   Please allow 48 business hours for a response.  Please remember that this is for non-urgent requests.  _______________________________________________________

## 2024-07-31 NOTE — Progress Notes (Signed)
 Ellouise Console, PA-C [address] [place], [place]  [address] Phone: [phone]   Primary Care Physician: Gretta Comer POUR, NP  Primary Gastroenterologist:  Ellouise Console, PA-C / Aloha Finner, MD   Chief Complaint: Follow-up gastric mass, upper GI bleed, acute blood loss anemia       HPI:   VAISHNAVI DALBY is a 89 y.o. female, established patient Dr. Albertus, presents for hospital follow-up of iron  deficiency anemia, upper GI bleed, and gastric mass.  She is here today with her niece Burnard Leu who helps with her care.  Presented to [facility] ED 07/12/24 with coffee-ground emesis and dark stools.  Hospital admission 07/12/2024 until 07/18/2024 for upper GI bleeding, required blood transfusion.  EGD showed bleeding ulcer and large gastric mass.  Biopsies inconclusive.  She is scheduled for EUS by Dr. Finner 08/13/2024.  She had anaphylactic allergic reaction to IV iron  07/23/2024 and had to be resuscitated.  Underwent CPR with chest compressions and her chest is still sore from rib fractures.  History of non-Hodgkin's lymphoma.  Gastric tumor treated over 15 years ago.  Currently she is not having any more nausea, vomiting, hematemesis, or melena.  Stools are dark on oral iron  once daily.  She denies abdominal pain.  She was losing weight, but weight has currently stabilized.  She continues taking pantoprazole  40 Mg once daily.  Denies NSAID use.  Takes Tylenol  prn.  PMH: DVT 11/2022, Afib, hypertension, CAD, mitral valve prolapse, GERD, IBS-C, diabetes type 2, MALT lymphoma, CKD 3A, IDA.  Currently on Eliquis .    Gastric tumor UGIB P/w with UGIB, h/o prior GIB and non hodgkins lymphoma. Gastric tumor treated >15 y ago.  CT chest: 1. Solitary pulmonary nodule in the RIGHT lower lobe is new from 2014. Indeterminate finding. FDG PET scan may aid in determining malignant potential. 2. No mediastinal adenopathy CT A/P: 1. Large mass within the lumen of the stomach. 2. No evidence of  metastatic adenopathy in the abdomen pelvis. 3. No liver metastasis. 4. No peritoneal metastasis. D/w oncologist, recommended tissue biopsy via EGD versus EUS 8/17 s/p EGD, ulcer and bleeding vessel was cauterized by GI and biopsy of gastric mass was done. Hb 6.8, transfuse 1 unit of PRBC today 8/18 Hb 8.4, stable no bleeding.  Diet advanced to soft diet 8/19 H&H stable, no active GI bleeding, no need of repeat EGD as per GI. Patient will be scheduled for EUS biopsy as an outpatient for definitive diagnosis and management as per oncologist. Protonix  was also continued with BID dosing.   Component Ref Range & Units (hover) 7 d ago (07/24/24) 8 d ago (07/23/24) 8 d ago (07/23/24) 8 d ago (07/23/24) 13 d ago (07/18/24) 2 wk ago (07/17/24) 2 wk ago (07/16/24)  WBC 4.1 4.2 5.6 3.9 Low  4.6 3.8 Low  3.2 Low   RBC 2.93 Low  3.01 Low  2.99 Low  2.97 Low  2.70 Low  2.65 Low  2.66 Low   Hemoglobin 9.5 Low  9.6 Low  9.4 Low  9.5 Low  8.7 Low  8.5 Low  8.4 Low   HCT 29.8 Low  29.8 Low  30.2 Low  29.6 Low  26.4 Low  25.8 Low  26.1 Low   MCV 101.7 High  99.0 101.0 High  99.7 97.8 97.4 98.1    Current Outpatient Medications  Medication Sig Dispense Refill   [Paused] apixaban  (ELIQUIS ) 5 MG TABS tablet Take 1 tablet (5 mg total) by  mouth 2 (two) times daily. 180 tablet 1   Bempedoic Acid -Ezetimibe  (NEXLIZET ) 180-10 MG TABS Take 1 tablet by mouth daily. Pt has scheduled an office visit with provider in April, 2025 90 tablet 2   Blood Glucose Monitoring Suppl DEVI 1 each by Does not apply route in the morning, at noon, and at bedtime. May substitute to any manufacturer covered by patient's insurance. 1 each 0   brimonidine  (ALPHAGAN ) 0.2 % ophthalmic solution Place 1 drop into the right eye in the morning and at bedtime.     Cholecalciferol  (VITAMIN D -3) 1000 UNITS CAPS Take 1 capsule by mouth daily.     CRANBERRY PO Take 1 tablet by mouth daily.     diclofenac  Sodium (VOLTAREN ) 1 % GEL Apply 4 g topically 4  (four) times daily. To your chest wall 350 g 1   Dulaglutide  (TRULICITY ) 0.75 MG/0.5ML SOAJ Inject 0.75 mg into the skin once a week. for diabetes. 6 mL 1   ezetimibe  (ZETIA ) 10 MG tablet Take 1 tablet (10 mg total) by mouth daily. 90 tablet 1   ferrous sulfate  325 (65 FE) MG tablet Take 1 tablet (325 mg total) by mouth daily with breakfast. 30 tablet 2   fexofenadine  (ALLEGRA ) 180 MG tablet Take 1 tablet (180 mg total) by mouth as needed for allergies or rhinitis. 90 tablet 0   furosemide  (LASIX ) 20 MG tablet Take 1 tablet by mouth daily. May take additional tablet as needed for swelling 180 tablet 3   gabapentin  (NEURONTIN ) 100 MG capsule Take 200 mg by mouth 2 (two) times daily.     Glucose Blood (BLOOD GLUCOSE TEST STRIPS) STRP 1 each by In Vitro route in the morning, at noon, and at bedtime. May substitute to any manufacturer covered by patient's insurance. 300 strip 5   Lancets Misc. MISC 1 each by Does not apply route in the morning, at noon, and at bedtime. May substitute to any manufacturer covered by patient's insurance. 300 each 5   metoprolol  succinate (TOPROL -XL) 100 MG 24 hr tablet TAKE 1 TABLET BY MOUTH EVERY MORNING WITH OR IMMEDIATELY FOLLOWING A MEAL 90 tablet 3   nitroGLYCERIN  (NITROSTAT ) 0.4 MG SL tablet Place 1 tablet (0.4 mg total) under the tongue every 5 (five) minutes as needed for chest pain. 25 tablet 2   ondansetron  (ZOFRAN -ODT) 4 MG disintegrating tablet Take 1 tablet (4 mg total) by mouth every 8 (eight) hours as needed for nausea or vomiting. 20 tablet 0   pantoprazole  (PROTONIX ) 40 MG tablet Take 1 tablet (40 mg total) by mouth 2 (two) times daily. TAKE 1 TABLET BY MOUTH DAILY FOR HEARTBURN 60 tablet 0   sertraline  (ZOLOFT ) 100 MG tablet TAKE 1 TABLET BY MOUTH DAILY FOR ANXIETY AND DEPRESSION 90 tablet 0   TOBRADEX ophthalmic ointment Place 1 Application into the right eye 3 (three) times daily.     vitamin B-12 (CYANOCOBALAMIN ) 1000 MCG tablet Take 1,000 mcg by mouth  daily.     No current facility-administered medications for this visit.    Allergies as of 07/31/2024 - Review Complete 07/31/2024  Allergen Reaction Noted   Other Rash and Anaphylaxis 03/04/2015   Shellfish allergy  Anaphylaxis and Rash 12/10/2011   Venofer  [iron  sucrose] Anaphylaxis 07/23/2024   Cephalexin   01/10/2008   Ciprofloxacin     Clopidogrel bisulfate     Ezetimibe -simvastatin  01/24/2007   Lidocaine  Hives 11/13/2015   Nitrofurantoin     Pregabalin     Celecoxib Rash 02/17/2015   Codeine  Rash 02/17/2015   Codeine phosphate Rash 01/24/2007   Hydrocod poli-chlorphe poli er Rash 03/04/2015   Propoxyphene Rash 03/04/2015   Propoxyphene n-acetaminophen  Rash    Rofecoxib Rash    Sulfa antibiotics Rash 02/17/2015   Sulfamethoxazole Rash 01/24/2007   Sulfonamide derivatives Rash 01/24/2007    Past Medical History:  Diagnosis Date   Acute bilateral thoracic back pain 08/16/2019   Acute post-hemorrhagic anemia 10/10/2023   Allergy     Cipro, Plavix, Statins   Anemia    Arthritis    Atrial fibrillation (HCC)    CAD (coronary artery disease)    a. s/p tandem Promus DES to LAD in 2009 by Dr. Obie;  b.  LHC (03/22/14):  prox LAD 30%, mid LAD 40%, LAD stent ok with dist 20% ISR, apical LAD occluded with L-L collats filling apical vessel (too small for PCI), mid RCA 20%, EF 70%.  Med Rx   Cancer [facility])    Chronic kidney disease, stage 3b (HCC)    Colon polyps    Diabetes mellitus    Diagnosed 2012   Diverticulosis of colon (without mention of hemorrhage)    DVT (deep venous thrombosis) (HCC) 12/2022   right leg   GERD (gastroesophageal reflux disease)    Glaucoma    Grade I diastolic dysfunction    Grade I diastolic dysfunction    Herpes zoster without complication 04/24/2020   Hyperlipidemia    intol of statins   Hypertension    Kidney stones    Left atrial dilation    Moderate aortic regurgitation    Motion sickness    back seat cars   NHL (non-Hodgkin's  lymphoma) (HCC) dx'd 2003   chemo/xrt comp 2003   Personal history of colonic polyps    Proctitis    Pruritic intertrigo 07/18/2014   Urticaria    Wears dentures    Full upper, partial lower   Wears hearing aid in both ears     Past Surgical History:  Procedure Laterality Date   BIOPSY  10/15/2023   Procedure: BIOPSY;  Surgeon: Aundria, Ladell POUR, MD;  Location: [facility] ENDOSCOPY;  Service: Gastroenterology;;   cardiac cath-neg     CATARACT EXTRACTION Bilateral    COLONOSCOPY WITH PROPOFOL  N/A 10/15/2023   Procedure: COLONOSCOPY WITH PROPOFOL ;  Surgeon: Toledo, Ladell POUR, MD;  Location: [facility] ENDOSCOPY;  Service: Gastroenterology;  Laterality: N/A;   CORONARY ANGIOPLASTY WITH STENT PLACEMENT     x 2   dexa-neg     ESOPHAGOGASTRODUODENOSCOPY N/A 07/13/2024   Procedure: EGD (ESOPHAGOGASTRODUODENOSCOPY);  Surgeon: Jinny Carmine, MD;  Location: [facility] ENDOSCOPY;  Service: Endoscopy;  Laterality: N/A;   ESOPHAGOGASTRODUODENOSCOPY N/A 07/15/2024   Procedure: EGD (ESOPHAGOGASTRODUODENOSCOPY);  Surgeon: Onita Elspeth Sharper, DO;  Location: [facility] ENDOSCOPY;  Service: Gastroenterology;  Laterality: N/A;   ESOPHAGOGASTRODUODENOSCOPY (EGD) WITH PROPOFOL  N/A 10/15/2023   Procedure: ESOPHAGOGASTRODUODENOSCOPY (EGD) WITH PROPOFOL ;  Surgeon: Toledo, Ladell POUR, MD;  Location: [facility] ENDOSCOPY;  Service: Gastroenterology;  Laterality: N/A;   HEMOSTASIS CLIP PLACEMENT  10/15/2023   Procedure: HEMOSTASIS CLIP PLACEMENT;  Surgeon: Aundria, Ladell POUR, MD;  Location: [facility] ENDOSCOPY;  Service: Gastroenterology;;   KNEE SURGERY  10/30/2003   left   LEFT HEART CATHETERIZATION WITH CORONARY ANGIOGRAM N/A 03/22/2014   Procedure: LEFT HEART CATHETERIZATION WITH CORONARY ANGIOGRAM;  Surgeon: Lonni JONETTA Cash, MD;  Location: [facility] CATH LAB;  Service: Cardiovascular;  Laterality: N/A;   POLYPECTOMY  10/15/2023   Procedure: POLYPECTOMY;  Surgeon: Toledo, Teodoro K, MD;  Location: [facility] ENDOSCOPY;  Service: Gastroenterology;;  stress cardiolite      VAGINAL HYSTERECTOMY     partial, fibroids, one ovary left    Review of Systems:    All systems reviewed and negative except where noted in HPI.    Physical Exam:  BP 124/70 (BP Location: Left Arm, Patient Position: Sitting, Cuff Size: Normal)   Pulse 92   Ht 5' 9 (1.753 m)   Wt 162 lb 4 oz (73.6 kg)   BMI 23.96 kg/m  No LMP recorded. Patient has had a hysterectomy.  General: Well-nourished, well-developed elderly female, in no acute distress.  Lungs: Clear to auscultation bilaterally. Non-labored.  Anterior Chest wall is very sore with deep breath and with movements. Heart: Irregular rhythm, Normal Rate, no murmurs rubs or gallops.  Abdomen: Bowel sounds are normal; Abdomen is Soft; No hepatosplenomegaly, masses or hernias;  No Abdominal Tenderness; No guarding or rebound tenderness. Neuro: Alert and oriented x 3.  Grossly intact.  Psych: Alert and cooperative, normal mood and affect.   Imaging Studies: CT HEAD WO CONTRAST ( ) Result Date: 07/23/2024 CLINICAL DATA:  Syncope, anticoagulation, evaluate for intracranial hemorrhage EXAM: CT HEAD WITHOUT CONTRAST TECHNIQUE: Contiguous axial images were obtained from the base of the skull through the vertex without intravenous contrast. RADIATION DOSE REDUCTION: This exam was performed according to the departmental dose-optimization program which includes automated exposure control, adjustment of the mA and/or kV according to patient size and/or use of iterative reconstruction technique. COMPARISON:  10/09/2023 FINDINGS: Brain: No evidence of acute infarction, hemorrhage, hydrocephalus, extra-axial collection or mass lesion/mass effect. Vascular: No hyperdense vessel or unexpected calcification. Skull: Normal. Negative for fracture or focal lesion. Sinuses/Orbits: No acute finding. Chronic mucosal thickening and bony sinus wall thickening of the partially imaged maxillary sinuses. Other: None. IMPRESSION: 1. No  acute intracranial pathology. 2. Chronic sinusitis, partially imaged. Electronically Signed   By: Marolyn JONETTA Jaksch M.D.   On: 07/23/2024 19:11   DG Chest Portable 1 View Result Date: 07/23/2024 EXAM: 1 VIEW XRAY OF THE CHEST 07/23/2024 04:20:00 PM COMPARISON: 11/20/2022 CLINICAL HISTORY: Cardiac arrest? Got brief chest compressions. FINDINGS: LUNGS AND PLEURA: No significant pleural effusion or pneumothorax. Subsegmental atelectasis versus scar in the left base. No airspace consolidation. HEART AND MEDIASTINUM: Mild enlargement of the cardiac silhouette. Aortic atherosclerotic calcification. BONES AND SOFT TISSUES: Visualized osseous structures appear grossly intact. Unchanged asymmetric elevation of the right hemidiaphragm. IMPRESSION: 1. Left base subsegmental atelectasis. 2. Aortic atherosclerosis and mild enlargement of the cardiac silhouette, Electronically signed by: Waddell Calk MD 07/23/2024 04:48 PM EDT RP Workstation: [id number]   US  Venous Img Upper Uni Right(DVT) Result Date: 07/19/2024 CLINICAL DATA:  Right upper arm pain and edema. Prior right arm IV placement. EXAM: RIGHT UPPER EXTREMITY VENOUS DOPPLER ULTRASOUND TECHNIQUE: Gray-scale sonography with graded compression, as well as color Doppler and duplex ultrasound were performed to evaluate the upper extremity deep venous system from the level of the subclavian vein and including the jugular, axillary, basilic, radial, ulnar and upper cephalic vein. Spectral Doppler was utilized to evaluate flow at rest and with distal augmentation maneuvers. COMPARISON:  None Available. FINDINGS: Contralateral Subclavian Vein: Respiratory phasicity is normal and symmetric with the symptomatic side. No evidence of thrombus. Normal compressibility. Internal Jugular Vein: No evidence of thrombus. Normal compressibility, respiratory phasicity and response to augmentation. Subclavian Vein: No evidence of thrombus. Normal compressibility, respiratory phasicity and  response to augmentation. Axillary Vein: No evidence of thrombus. Normal compressibility, respiratory phasicity and response to augmentation. Cephalic Vein: Superficial thrombophlebitis of the right cephalic  vein with occlusive thrombus present from the level of the proximal forearm to the mid upper arm. Basilic Vein: No evidence of thrombus. Normal compressibility, respiratory phasicity and response to augmentation. Brachial Veins: No evidence of thrombus. Normal compressibility, respiratory phasicity and response to augmentation. Radial Veins: No evidence of thrombus. Normal compressibility, respiratory phasicity and response to augmentation. Ulnar Veins: No evidence of thrombus. Normal compressibility, respiratory phasicity and response to augmentation. Venous Reflux:  None visualized. Other Findings:  No abnormal fluid collections. IMPRESSION: 1. Superficial thrombophlebitis of the right cephalic vein from the level of the proximal forearm to the mid upper arm. 2. No evidence of DVT within the right upper extremity. Electronically Signed   By: Marcey Moan M.D.   On: 07/19/2024 08:25   CT CHEST ABDOMEN PELVIS W CONTRAST Result Date: 07/13/2024 CLINICAL DATA:  Metastatic disease evaluation. * Tracking Code: BO *. EXAM: CT CHEST, ABDOMEN, AND PELVIS WITH CONTRAST TECHNIQUE: Multidetector CT imaging of the chest, abdomen and pelvis was performed following the standard protocol during bolus administration of intravenous contrast. RADIATION DOSE REDUCTION: This exam was performed according to the departmental dose-optimization program which includes automated exposure control, adjustment of the mA and/or kV according to patient size and/or use of iterative reconstruction technique. CONTRAST:  80mL OMNIPAQUE  IOHEXOL  300 MG/ML  SOLN COMPARISON:  CT chest 08/09/2013 FINDINGS: CT CHEST FINDINGS Cardiovascular: No significant vascular findings. Normal heart size. No pericardial effusion. Mediastinum/Nodes: No  axillary or supraclavicular adenopathy. No mediastinal or hilar adenopathy. No pericardial fluid. Esophagus normal. Lungs/Pleura: 10 mm nodule in the RIGHT lower lobe (image 45/2) is new from 2014 Musculoskeletal: No aggressive osseous lesion. CT ABDOMEN AND PELVIS FINDINGS Hepatobiliary: No focal hepatic lesion. No biliary ductal dilatation. Gallbladder is normal. Common bile duct is normal. Pancreas: Pancreas is normal. No ductal dilatation. No pancreatic inflammation. Spleen: Normal spleen Adrenals/urinary tract: Adrenal glands and kidneys are normal. The ureters and bladder normal. Stomach/Bowel: Large round well-circumscribed mass in the mid stomach measures 6.3 x 6.3 cm (image 63) mass is contained within lumen of the stomach. No enlarged lymph nodes in the gastrohepatic ligament. The duodenum, small-bowel colon normal. Vascular/Lymphatic: Abdominal aorta is normal caliber with atherosclerotic calcification. There is no retroperitoneal or periportal lymphadenopathy. No pelvic lymphadenopathy. Reproductive: Unremarkable Other: No peritoneal or omental metastasis Musculoskeletal: No aggressive osseous lesion. IMPRESSION: CHEST: 1. Solitary pulmonary nodule in the RIGHT lower lobe is new from 2014. Indeterminate finding. FDG PET scan may aid in determining malignant potential. 2. No mediastinal adenopathy. PELVIS: 1. Large mass within the lumen of the stomach. 2. No evidence of metastatic adenopathy in the abdomen pelvis. 3. No liver metastasis. 4. No peritoneal metastasis. 5.  Aortic Atherosclerosis (ICD10-I70.0). Electronically Signed   By: Jackquline Boxer M.D.   On: 07/13/2024 17:35    Labs: CBC    Component Value Date/Time   WBC 4.1 07/24/2024 0156   RBC 2.93 (L) 07/24/2024 0156   HGB 9.5 (L) 07/24/2024 0156   HGB 9.5 (L) 07/23/2024 1345   HGB 11.1 12/20/2022 1508   HGB 14.6 12/25/2007 0834   HCT 29.8 (L) 07/24/2024 0156   HCT 34.0 12/20/2022 1508   HCT 42.1 12/25/2007 0834   PLT 272  07/24/2024 0156   PLT 283 07/23/2024 1345   PLT 279 12/20/2022 1508   MCV 101.7 (H) 07/24/2024 0156   MCV 92 12/20/2022 1508   MCV 96.9 12/25/2007 0834   MCH 32.4 07/24/2024 0156   MCHC 31.9 07/24/2024 0156   RDW 19.2 (H)  07/24/2024 0156   RDW 14.2 12/20/2022 1508   RDW 13.2 12/25/2007 0834   LYMPHSABS 2.2 07/23/2024 1610   LYMPHSABS 1.8 12/20/2022 1508   LYMPHSABS 1.9 12/25/2007 0834   MONOABS 0.4 07/23/2024 1610   MONOABS 0.3 12/25/2007 0834   EOSABS 0.0 07/23/2024 1610   EOSABS 0.1 12/20/2022 1508   BASOSABS 0.0 07/23/2024 1610   BASOSABS 0.0 12/20/2022 1508   BASOSABS 0.0 12/25/2007 0834    CMP     Component Value Date/Time   NA 140 07/24/2024 0636   NA 141 12/20/2022 1508   K 4.8 07/24/2024 0636   CL 108 07/24/2024 0636   CO2 27 07/24/2024 0636   GLUCOSE 222 (H) 07/24/2024 0636   BUN 13 07/24/2024 0636   BUN 20 12/20/2022 1508   CREATININE 1.01 (H) 07/24/2024 0636   CREATININE 1.14 (H) 07/23/2024 1345   CREATININE 1.31 (H) 09/30/2023 1521   CALCIUM 8.6 (L) 07/24/2024 0636   PROT 6.6 07/23/2024 1610   ALBUMIN 3.5 07/23/2024 1610   AST 25 07/23/2024 1610   AST 20 07/23/2024 1345   ALT 12 07/23/2024 1610   ALT 12 07/23/2024 1345   ALKPHOS 38 07/23/2024 1610   BILITOT 0.9 07/23/2024 1610   BILITOT 0.7 07/23/2024 1345   GFRNONAA 53 (L) 07/24/2024 0636   GFRNONAA 46 (L) 07/23/2024 1345   GFRAA 45 (L) 04/24/2020 0800     Assessment and Plan:   SABRIAH HOBBINS is a 89 y.o. y/o female returns for follow-up of:  Gastric Tumor Gastric Ulcer Upper GI Bleed Acute Blood Loss Anemia; iron  and B12 deficiency Rib fractures s/p chest compressions from CPR on 8/25.  Had anaphylactic reaction to IV iron .  Plan: - Continue with plan for EUS / EGD with Dr. Wilhelmenia I discussed risks of EUS / EGD with patient to include risk of bleeding, perforation, and risk of sedation.  Patient expressed understanding and agrees to proceed with EGD.  - Labs: CBC, Iron  panal,  ferritin, vitamin B12 - Continue pantoprazole  40 Mg once daily - Avoid all NSAIDs.  Okay to take Tylenol  if needed. - Avoid IV iron  indefinitely. - Continue oral vitamin B12 and oral iron  once daily with vitamin C. - Encouraged her to take deep breaths to prevent pneumonia.  Lungs sound clear today.  Ellouise Console, PA-C  Follow up based on EUS/EGD.

## 2024-07-31 NOTE — Telephone Encounter (Signed)
 Author: Mansouraty, Aloha Raddle., MD Service: Gastroenterology Author Type: Physician  Filed: 07/31/2024  4:31 PM Encounter Date: 07/31/2024 Status: Signed  Editor: Mansouraty, Aloha Raddle., MD (Physician)    Attending Physician's Attestation    I have reviewed the chart.    I agree with the Advanced Practitioner's note, impression, and recommendations with any updates as below. 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  FNB needs to be performed to clarify diagnosis, because of the size of this lesion, she may need neoadjuvant Gleevac if this is a GIST before surgical options (especially based on her age).  I tried to see if I can expedite her case for this Thursday, but as a result of Trulicity  being in her system she would likely would need an intubation after discussion with anesthesia, and with her just recently having had anaphylactic reaction and requiring CPR, I really would like to try to minimize sedation and medicines for her if possible, for her procedure.  Thus we will move her case to 9/11, and my team will work on discussing this with the patient tomorrow (call patient's home/mobile number after 9 AM) to tell her exact time of presentation.  She will hold Trulicity  next week.  She will not restart blood thinner as per her last hospitalization discussions.    Aloha Finner, MD [facility]oscopy Office # [phone]

## 2024-08-01 ENCOUNTER — Ambulatory Visit

## 2024-08-01 ENCOUNTER — Ambulatory Visit: Payer: Self-pay | Admitting: Physician Assistant

## 2024-08-01 LAB — IRON,TIBC AND FERRITIN PANEL
%SAT: 43 % (ref 16–45)
Ferritin: 350 ng/mL — ABNORMAL HIGH (ref 16–288)
Iron: 139 ug/dL (ref 45–160)
TIBC: 323 ug/dL (ref 250–450)

## 2024-08-01 NOTE — Telephone Encounter (Signed)
 The pt has been advised that her appt has been moved to 9/11 at 930 am at [facility] with [facility]- she has been re instructed and will hold Eliquis  and Trulicity .  New instructions have been sent to the pt My Chart and all questions answered

## 2024-08-02 ENCOUNTER — Encounter (HOSPITAL_COMMUNITY): Payer: Self-pay | Admitting: Gastroenterology

## 2024-08-02 NOTE — Progress Notes (Signed)
 Attempted to obtain medical history for pre op call via telephone, unable to reach at this time. HIPAA compliant voicemail message left requesting return call to pre surgical testing department.

## 2024-08-03 ENCOUNTER — Ambulatory Visit: Attending: Internal Medicine | Admitting: Student

## 2024-08-03 DIAGNOSIS — Z01810 Encounter for preprocedural cardiovascular examination: Secondary | ICD-10-CM

## 2024-08-03 NOTE — Progress Notes (Signed)
 Virtual Visit via Telephone Note   Because of [name] [name]'s co-morbid illnesses, she is at least at moderate risk for complications without adequate follow up.  This format is felt to be most appropriate for this patient at this time.  The patient did not have access to video technology/had technical difficulties with video requiring transitioning to audio format only (telephone).  All issues noted in this document were discussed and addressed.  No physical exam could be performed with this format.  Please refer to the patient's chart for her consent to telehealth for [name] [name].  Evaluation Performed:  Preoperative cardiovascular risk assessment _____________   Date:  08/03/2024   Patient ID:  [name] [name], DOB Feb 23, 1932, MRN [id number] Patient Location:  Home Provider location:   Office  Primary Care Provider:  Gretta Comer POUR, NP Primary Cardiologist:  [name] Lunger, MD  Chief Complaint / Patient Profile   89 y.o. y/o female with a h/o CAD s/p PCI with tandem nonoverlapping DES to LAD, patent stents per angiography April 2015, chronic HFpEF, PAF on anticoagulation, mitral valve prolapse hypertension, hyperlipidemia, DVT, GERD, CKD stage IIIb who is pending endoscopy by [name] [name] on 08/09/2024 and presents today for telephonic preoperative cardiovascular risk assessment.  History of Present Illness    [name] [name] is a 89 y.o. female who presents via audio/video conferencing for a telehealth visit today.  Pt was last seen in cardiology clinic on 03/05/2024 by Dr. Gollan.  At that time [name] [name] was stable from a cardiac standpoint.  The patient is now pending procedure as outlined above. Since her last visit, she is doing well. Patient denies shortness of breath, dyspnea on exertion, lower extremity edema, orthopnea or PND. No chest pain, pressure, or tightness. No palpitations.  She is not experiencing lightheadedness, dizziness, presyncope or syncope. She lives alone  and is independent with all ADLs. She is able to perform light to moderate household activities with the only limitation being sweeping secondary to it hurting her shoulder.   Past Medical History    Past Medical History:  Diagnosis Date   Acute bilateral thoracic back pain 08/16/2019   Acute post-hemorrhagic anemia 10/10/2023   Allergy     Cipro, Plavix, Statins   Anemia    Arthritis    Atrial fibrillation (HCC)    CAD (coronary artery disease)    a. s/p tandem Promus DES to LAD in 2009 by Dr. Obie;  b.  LHC (03/22/14):  prox LAD 30%, mid LAD 40%, LAD stent ok with dist 20% ISR, apical LAD occluded with L-L collats filling apical vessel (too small for PCI), mid RCA 20%, EF 70%.  Med Rx   Cancer [facility])    Chronic kidney disease, stage 3b (HCC)    Colon polyps    Diabetes mellitus    Diagnosed 2012   Diverticulosis of colon (without mention of hemorrhage)    DVT (deep venous thrombosis) (HCC) 12/2022   right leg   GERD (gastroesophageal reflux disease)    Glaucoma    Grade I diastolic dysfunction    Grade I diastolic dysfunction    Herpes zoster without complication 04/24/2020   Hyperlipidemia    intol of statins   Hypertension    Kidney stones    Left atrial dilation    Moderate aortic regurgitation    Motion sickness    back seat cars   NHL (non-Hodgkin's lymphoma) (HCC) dx'd 2003   chemo/xrt comp 2003   Personal history of  colonic polyps    Proctitis    Pruritic intertrigo 07/18/2014   Urticaria    Wears dentures    Full upper, partial lower   Wears hearing aid in both ears    Past Surgical History:  Procedure Laterality Date   BIOPSY  10/15/2023   Procedure: BIOPSY;  Surgeon: Aundria, Ladell POUR, MD;  Location: [facility] ENDOSCOPY;  Service: Gastroenterology;;   cardiac cath-neg     CATARACT EXTRACTION Bilateral    COLONOSCOPY WITH PROPOFOL  N/A 10/15/2023   Procedure: COLONOSCOPY WITH PROPOFOL ;  Surgeon: Toledo, Ladell POUR, MD;  Location: [facility] ENDOSCOPY;  Service:  Gastroenterology;  Laterality: N/A;   CORONARY ANGIOPLASTY WITH STENT PLACEMENT     x 2   dexa-neg     ESOPHAGOGASTRODUODENOSCOPY N/A 07/13/2024   Procedure: EGD (ESOPHAGOGASTRODUODENOSCOPY);  Surgeon: Jinny Carmine, MD;  Location: [facility] ENDOSCOPY;  Service: Endoscopy;  Laterality: N/A;   ESOPHAGOGASTRODUODENOSCOPY N/A 07/15/2024   Procedure: EGD (ESOPHAGOGASTRODUODENOSCOPY);  Surgeon: Onita Elspeth Sharper, DO;  Location: [facility] ENDOSCOPY;  Service: Gastroenterology;  Laterality: N/A;   ESOPHAGOGASTRODUODENOSCOPY (EGD) WITH PROPOFOL  N/A 10/15/2023   Procedure: ESOPHAGOGASTRODUODENOSCOPY (EGD) WITH PROPOFOL ;  Surgeon: Toledo, Ladell POUR, MD;  Location: [facility] ENDOSCOPY;  Service: Gastroenterology;  Laterality: N/A;   HEMOSTASIS CLIP PLACEMENT  10/15/2023   Procedure: HEMOSTASIS CLIP PLACEMENT;  Surgeon: Aundria, Ladell POUR, MD;  Location: [facility] ENDOSCOPY;  Service: Gastroenterology;;   KNEE SURGERY  10/30/2003   left   LEFT HEART CATHETERIZATION WITH CORONARY ANGIOGRAM N/A 03/22/2014   Procedure: LEFT HEART CATHETERIZATION WITH CORONARY ANGIOGRAM;  Surgeon: Lonni JONETTA Cash, MD;  Location: [facility] CATH LAB;  Service: Cardiovascular;  Laterality: N/A;   POLYPECTOMY  10/15/2023   Procedure: POLYPECTOMY;  Surgeon: Toledo, Ladell POUR, MD;  Location: [facility] ENDOSCOPY;  Service: Gastroenterology;;   stress cardiolite      VAGINAL HYSTERECTOMY     partial, fibroids, one ovary left    Allergies  Allergies  Allergen Reactions   Other Rash and Anaphylaxis   Shellfish Allergy  Anaphylaxis and Rash   Venofer  [Iron  Sucrose] Anaphylaxis    Severe anaphylactic reaction to Venofer -needing chest compressions; EpiPen .    Cephalexin      REACTION: trash and swelling   Ciprofloxacin     REACTION: u/k   Clopidogrel Bisulfate     REACTION: swelling, rash   Ezetimibe -Simvastatin     REACTION: myalgias   Lidocaine  Hives    ONLY TO ADHESIVE LIDOCAINE  PATCHES. NO PROBLEM WITH INJECTABLE LIDOCAINE .   Nitrofurantoin      REACTION: panama   Pregabalin     REACTION: felt bad   Celecoxib Rash    REACTION: panama   Codeine Rash   Codeine Phosphate Rash   Hydrocod Poli-Chlorphe Poli Er Rash   Propoxyphene Rash   Propoxyphene N-Acetaminophen  Rash   Rofecoxib Rash   Sulfa Antibiotics Rash   Sulfamethoxazole Rash   Sulfonamide Derivatives Rash    Home Medications    Prior to Admission medications   Medication Sig Start Date End Date Taking? Authorizing Provider  apixaban  (ELIQUIS ) 5 MG TABS tablet Take 1 tablet (5 mg total) by mouth 2 (two) times daily. 03/05/24   [name], [name] J, MD  Bempedoic Acid -Ezetimibe  (NEXLIZET ) 180-10 MG TABS Take 1 tablet by mouth daily. Pt has scheduled an office visit with provider in April, 2025 01/02/24   Cash Lonni JONETTA, MD  Blood Glucose Monitoring Suppl DEVI 1 each by Does not apply route in the morning, at noon, and at bedtime. May substitute to any manufacturer covered by patient's insurance.  02/16/23   [name], Katherine K, NP  brimonidine  (ALPHAGAN ) 0.2 % ophthalmic solution Place 1 drop into the right eye in the morning and at bedtime.    [provider]  Cholecalciferol  (VITAMIN D -3) 1000 UNITS CAPS Take 1 capsule by mouth daily.    [provider]  CRANBERRY PO Take 1 tablet by mouth daily.    [provider]  diclofenac  Sodium (VOLTAREN ) 1 % GEL Apply 4 g topically 4 (four) times daily. To your chest wall 07/24/24   Caleen Qualia, MD  Dulaglutide  (TRULICITY ) 0.75 MG/0.5ML SOAJ Inject 0.75 mg into the skin once a week. for diabetes. 03/08/24   [name], Katherine K, NP  ezetimibe  (ZETIA ) 10 MG tablet Take 1 tablet (10 mg total) by mouth daily. 07/24/24   Caleen Qualia, MD  ferrous sulfate  325 (65 FE) MG tablet Take 1 tablet (325 mg total) by mouth daily with breakfast. 10/28/22   Kerman Vina HERO, NP  fexofenadine  (ALLEGRA ) 180 MG tablet Take 1 tablet (180 mg total) by mouth as needed for allergies or rhinitis. 04/03/19   [name], Katherine K, NP   furosemide  (LASIX ) 20 MG tablet Take 1 tablet by mouth daily. May take additional tablet as needed for swelling 03/05/24   Perla [name] PARAS, MD  gabapentin  (NEURONTIN ) 100 MG capsule Take 200 mg by mouth 2 (two) times daily.    [provider]  Glucose Blood (BLOOD GLUCOSE TEST STRIPS) STRP 1 each by In Vitro route in the morning, at noon, and at bedtime. May substitute to any manufacturer covered by patient's insurance. 02/16/23   [name] Comer POUR, NP  Lancets Misc. MISC 1 each by Does not apply route in the morning, at noon, and at bedtime. May substitute to any manufacturer covered by patient's insurance. 02/16/23   [name], Katherine K, NP  metoprolol  succinate (TOPROL -XL) 100 MG 24 hr tablet TAKE 1 TABLET BY MOUTH EVERY MORNING WITH OR IMMEDIATELY FOLLOWING A MEAL 03/05/24   [name], [name] J, MD  nitroGLYCERIN  (NITROSTAT ) 0.4 MG SL tablet Place 1 tablet (0.4 mg total) under the tongue every 5 (five) minutes as needed for chest pain. 03/05/24   [name], [name] J, MD  ondansetron  (ZOFRAN -ODT) 4 MG disintegrating tablet Take 1 tablet (4 mg total) by mouth every 8 (eight) hours as needed for nausea or vomiting. 07/12/24   [name], Katherine K, NP  pantoprazole  (PROTONIX ) 40 MG tablet Take 1 tablet (40 mg total) by mouth 2 (two) times daily. TAKE 1 TABLET BY MOUTH DAILY FOR HEARTBURN 07/18/24   Franchot Novel, MD  sertraline  (ZOLOFT ) 100 MG tablet TAKE 1 TABLET BY MOUTH DAILY FOR ANXIETY AND DEPRESSION 07/06/24   [name], Katherine K, NP  TOBRADEX ophthalmic ointment Place 1 Application into the right eye 3 (three) times daily. 07/27/24   [provider]  vitamin B-12 (CYANOCOBALAMIN ) 1000 MCG tablet Take 1,000 mcg by mouth daily.    [provider]  loratadine  (CLARITIN ) 10 MG tablet Take 10 mg by mouth as needed.   02/15/12  [provider]    Physical Exam    Vital Signs:  [name] [name] does not have vital signs available for review today.  Given telephonic nature of  communication, physical exam is limited. AAOx3. NAD. Normal affect.  Speech and respirations are unlabored.   Assessment & Plan    Primary Cardiologist: [name] Gollan, MD  Preoperative cardiovascular risk assessment.  Endoscopy by Alle[place] [name] on 08/09/2024  Chart reviewed as part of pre-operative protocol coverage. According to  the RCRI, patient has a 6.6% risk of MACE. Patient reports activity equivalent to 4.0 METS (walking, performing light to moderate household activities).   Given past medical history and time since last visit, based on ACC/AHA guidelines, [name] [name] would be at acceptable risk for the planned procedure without further cardiovascular testing.   Patient was advised that if she develops new symptoms prior to surgery to contact our office to arrange a follow-up appointment.  she verbalized understanding.  Per Pharm D, patient has not had an Afib/aflutter ablation within the last 3 months or DCCV within the last 30 days. Patient may hold Eliquis  for 2 days prior to procedure.   I will route this recommendation to the requesting party via Epic fax function.  Please call with questions.  Time:   Today, I have spent 6 minutes with the patient with telehealth technology discussing medical history, symptoms, and management plan.     Barnie Hila, NP  08/03/2024, 8:39 AM

## 2024-08-03 NOTE — Progress Notes (Signed)
 Noted the pt has been advised

## 2024-08-08 NOTE — Anesthesia Preprocedure Evaluation (Signed)
 Anesthesia Evaluation  Patient identified by MRN, date of birth, ID band Patient awake    Reviewed: Allergy  & Precautions, NPO status , Patient's Chart, lab work & pertinent test results  History of Anesthesia Complications Negative for: history of anesthetic complications  Airway Mallampati: III  TM Distance: <3 FB Neck ROM: Full    Dental no notable dental hx. (+) Dental Advisory Given, Teeth Intact   Pulmonary neg pulmonary ROS   Pulmonary exam normal breath sounds clear to auscultation       Cardiovascular hypertension, + CAD (s/p stents on Eliquis , last dose 07/12/24) and +CHF  + dysrhythmias (a fib) Atrial Fibrillation  Rhythm:Irregular Rate:Normal  Hx DVT  ECG 07/12/24:  Atrial fibrillation with rapid V-rate Low voltage, precordial leads Minimal ST depression   Echo 2023  1. Left ventricular ejection fraction, by estimation, is 60 to 65%. The  left ventricle has normal function. The left ventricle has no regional  wall motion abnormalities. There is mild concentric left ventricular  hypertrophy of the with severe basal  septal thickness. Left ventricular diastolic parameters are consistent  with Grade I diastolic dysfunction (impaired relaxation).   2. Right ventricular systolic function is normal. The right ventricular  size is normal.   3. Left atrial size was moderately dilated.   4. The pericardial effusion is posterior to the left ventricle. There is  no evidence of cardiac tamponade.   5. The mitral valve is normal in structure. No evidence of mitral valve  regurgitation. No evidence of mitral stenosis.   6. The aortic valve is tricuspid. Aortic valve regurgitation is mild.  Aortic valve sclerosis is present, with no evidence of aortic valve  stenosis.   7. The inferior vena cava is normal in size with greater than 50%  respiratory variability, suggesting right atrial pressure of 3 mmHg.     Neuro/Psych   PSYCHIATRIC DISORDERS Anxiety Depression    HOH    GI/Hepatic PUD,GERD  ,,Gastric tumor   Endo/Other  diabetes, Type 2    Renal/GU Renal disease (stage III CKD; nephrolithiasis)     Musculoskeletal  (+) Arthritis ,    Abdominal   Peds  Hematology  (+) Blood dyscrasia, anemia Non-Hodgkin lymphoma   Anesthesia Other Findings Last dose of Trulicity  07/10/24.    Cardiology note 03/05/24:  Coronary artery disease with stable angina Currently with no symptoms of angina. No further workup at this time. Continue current medication regimen.   Hyperlipidemia On nexlezit, cholesterol at goal Uses patient assistance typically filled out beginning of the year   Essential hypertension Blood pressure is well controlled on today's visit. No changes made to the medications.   Long-term persistent atrial fibrillation Long discussion concerning various treatment options for her atrial fibrillation She reports that prior primary cardiologist in [place] told her to not pursue cardioversion She feels she is asymptomatic, echo with normal ejection fraction She is inclined for rate control rather than rhythm control Tolerating Eliquis  5 twice daily currently   Anemia Prior history of GI bleed, hemoglobin stable, iron  stable   Reproductive/Obstetrics                              Anesthesia Physical Anesthesia Plan  ASA: 3  Anesthesia Plan: MAC   Post-op Pain Management: Minimal or no pain anticipated   Induction: Intravenous  PONV Risk Score and Plan: 3 and Propofol  infusion, TIVA and Treatment may vary due to age or medical  condition  Airway Management Planned: Natural Airway  Additional Equipment:   Intra-op Plan:   Post-operative Plan:   Informed Consent: I have reviewed the patients History and Physical, chart, labs and discussed the procedure including the risks, benefits and alternatives for the proposed anesthesia with the patient or  authorized representative who has indicated his/her understanding and acceptance.     Dental advisory given  Plan Discussed with: CRNA  Anesthesia Plan Comments:          Anesthesia Quick Evaluation

## 2024-08-09 ENCOUNTER — Ambulatory Visit (HOSPITAL_COMMUNITY): Payer: Self-pay | Admitting: Anesthesiology

## 2024-08-09 ENCOUNTER — Encounter (HOSPITAL_COMMUNITY): Payer: Self-pay | Admitting: Gastroenterology

## 2024-08-09 ENCOUNTER — Ambulatory Visit (HOSPITAL_COMMUNITY)
Admission: RE | Admit: 2024-08-09 | Discharge: 2024-08-09 | Disposition: A | Discharge status: Home / Self Care | Attending: Gastroenterology | Admitting: Gastroenterology

## 2024-08-09 ENCOUNTER — Telehealth: Payer: Self-pay | Admitting: Cardiovascular Disease

## 2024-08-09 ENCOUNTER — Encounter (HOSPITAL_COMMUNITY)
Admission: RE | Disposition: A | Discharge status: Home / Self Care | Payer: Self-pay | Source: Home / Self Care | Attending: Gastroenterology

## 2024-08-09 ENCOUNTER — Other Ambulatory Visit: Payer: Self-pay

## 2024-08-09 DIAGNOSIS — I130 Hypertensive heart and chronic kidney disease with heart failure and stage 1 through stage 4 chronic kidney disease, or unspecified chronic kidney disease: Secondary | ICD-10-CM | POA: Insufficient documentation

## 2024-08-09 DIAGNOSIS — Z86718 Personal history of other venous thrombosis and embolism: Secondary | ICD-10-CM | POA: Insufficient documentation

## 2024-08-09 DIAGNOSIS — R933 Abnormal findings on diagnostic imaging of other parts of digestive tract: Secondary | ICD-10-CM | POA: Diagnosis not present

## 2024-08-09 DIAGNOSIS — K2289 Other specified disease of esophagus: Secondary | ICD-10-CM

## 2024-08-09 DIAGNOSIS — K3189 Other diseases of stomach and duodenum: Secondary | ICD-10-CM | POA: Diagnosis not present

## 2024-08-09 DIAGNOSIS — Z8572 Personal history of non-Hodgkin lymphomas: Secondary | ICD-10-CM | POA: Insufficient documentation

## 2024-08-09 DIAGNOSIS — Z955 Presence of coronary angioplasty implant and graft: Secondary | ICD-10-CM | POA: Diagnosis not present

## 2024-08-09 DIAGNOSIS — I899 Noninfective disorder of lymphatic vessels and lymph nodes, unspecified: Secondary | ICD-10-CM | POA: Diagnosis not present

## 2024-08-09 DIAGNOSIS — D371 Neoplasm of uncertain behavior of stomach: Secondary | ICD-10-CM

## 2024-08-09 DIAGNOSIS — K219 Gastro-esophageal reflux disease without esophagitis: Secondary | ICD-10-CM | POA: Insufficient documentation

## 2024-08-09 DIAGNOSIS — I509 Heart failure, unspecified: Secondary | ICD-10-CM | POA: Diagnosis not present

## 2024-08-09 DIAGNOSIS — D490 Neoplasm of unspecified behavior of digestive system: Secondary | ICD-10-CM | POA: Diagnosis not present

## 2024-08-09 DIAGNOSIS — I4819 Other persistent atrial fibrillation: Secondary | ICD-10-CM | POA: Diagnosis not present

## 2024-08-09 DIAGNOSIS — K317 Polyp of stomach and duodenum: Secondary | ICD-10-CM | POA: Diagnosis not present

## 2024-08-09 DIAGNOSIS — K449 Diaphragmatic hernia without obstruction or gangrene: Secondary | ICD-10-CM

## 2024-08-09 DIAGNOSIS — E1122 Type 2 diabetes mellitus with diabetic chronic kidney disease: Secondary | ICD-10-CM | POA: Diagnosis not present

## 2024-08-09 DIAGNOSIS — N1832 Chronic kidney disease, stage 3b: Secondary | ICD-10-CM | POA: Diagnosis not present

## 2024-08-09 DIAGNOSIS — N183 Chronic kidney disease, stage 3 unspecified: Secondary | ICD-10-CM | POA: Diagnosis not present

## 2024-08-09 DIAGNOSIS — D499 Neoplasm of unspecified behavior of unspecified site: Secondary | ICD-10-CM | POA: Insufficient documentation

## 2024-08-09 DIAGNOSIS — I2510 Atherosclerotic heart disease of native coronary artery without angina pectoris: Secondary | ICD-10-CM | POA: Insufficient documentation

## 2024-08-09 DIAGNOSIS — I5032 Chronic diastolic (congestive) heart failure: Secondary | ICD-10-CM | POA: Diagnosis not present

## 2024-08-09 DIAGNOSIS — I4891 Unspecified atrial fibrillation: Secondary | ICD-10-CM

## 2024-08-09 HISTORY — PX: ESOPHAGOGASTRODUODENOSCOPY: SHX5428

## 2024-08-09 HISTORY — PX: FINE NEEDLE ASPIRATION: SHX6590

## 2024-08-09 HISTORY — PX: EUS: SHX5427

## 2024-08-09 LAB — GLUCOSE, CAPILLARY: Glucose-Capillary: 160 mg/dL — ABNORMAL HIGH (ref 70–99)

## 2024-08-09 SURGERY — ULTRASOUND, UPPER GI TRACT, ENDOSCOPIC
Anesthesia: Monitor Anesthesia Care

## 2024-08-09 MED ORDER — SODIUM CHLORIDE 0.9 % IV SOLN
INTRAVENOUS | Status: DC | PRN
Start: 2024-08-09 — End: 2024-08-09

## 2024-08-09 MED ORDER — SUCRALFATE 1 G PO TABS: 1.0000 g | ORAL_TABLET | Freq: Four times a day (QID) | ORAL | 2 refills | Status: DC

## 2024-08-09 MED ORDER — FENTANYL CITRATE (PF) 100 MCG/2ML IJ SOLN
INTRAMUSCULAR | Status: AC
  Filled 2024-08-09: qty 2

## 2024-08-09 MED ORDER — PROPOFOL 1000 MG/100ML IV EMUL
INTRAVENOUS | Status: AC
  Filled 2024-08-09: qty 100

## 2024-08-09 MED ORDER — FENTANYL CITRATE (PF) 100 MCG/2ML IJ SOLN
INTRAMUSCULAR | Status: DC | PRN
  Administered 2024-08-09: 25 ug via INTRAVENOUS

## 2024-08-09 MED ORDER — SUCRALFATE 1 G PO TABS: 1.0000 g | ORAL_TABLET | Freq: Two times a day (BID) | ORAL | 2 refills | Status: DC

## 2024-08-09 MED ORDER — PROPOFOL 10 MG/ML IV BOLUS
INTRAVENOUS | Status: DC | PRN
  Administered 2024-08-09: 30 mg via INTRAVENOUS
  Administered 2024-08-09: 50 ug/kg/min via INTRAVENOUS
  Administered 2024-08-09: 20 mg via INTRAVENOUS

## 2024-08-09 MED ORDER — SODIUM CHLORIDE 0.9 % IV SOLN: INTRAVENOUS | Status: DC

## 2024-08-09 MED ORDER — LIDOCAINE 2% (20 MG/ML) 5 ML SYRINGE
INTRAMUSCULAR | Status: DC | PRN
  Administered 2024-08-09: 100 mg via INTRAVENOUS

## 2024-08-09 MED ORDER — PANTOPRAZOLE SODIUM 40 MG PO TBEC: 40.0000 mg | DELAYED_RELEASE_TABLET | Freq: Two times a day (BID) | ORAL | 12 refills | Status: AC

## 2024-08-09 MED ORDER — MIDAZOLAM HCL 2 MG/2ML IJ SOLN
INTRAMUSCULAR | Status: AC
  Filled 2024-08-09: qty 2

## 2024-08-09 MED ORDER — ONDANSETRON HCL 4 MG/2ML IJ SOLN
INTRAMUSCULAR | Status: DC | PRN
  Administered 2024-08-09: 4 mg via INTRAVENOUS

## 2024-08-09 MED ORDER — PHENYLEPHRINE 80 MCG/ML (10ML) SYRINGE FOR IV PUSH (FOR BLOOD PRESSURE SUPPORT)
PREFILLED_SYRINGE | INTRAVENOUS | Status: DC | PRN
  Administered 2024-08-09 (×2): 80 ug via INTRAVENOUS

## 2024-08-09 NOTE — H&P (Signed)
 GASTROENTEROLOGY PROCEDURE H&P NOTE   Primary Care Physician: Gretta Comer POUR, NP  HPI: [name] [name] is a 89 y.o. female who presents for EGD/EUS to evaluate and sample gastric SEL concerning for GIST.  Past Medical History:  Diagnosis Date   Acute bilateral thoracic back pain 08/16/2019   Acute post-hemorrhagic anemia 10/10/2023   Allergy     Cipro, Plavix, Statins   Anemia    Arthritis    Atrial fibrillation (HCC)    CAD (coronary artery disease)    a. s/p tandem Promus DES to LAD in 2009 by Dr. Obie;  b.  LHC (03/22/14):  prox LAD 30%, mid LAD 40%, LAD stent ok with dist 20% ISR, apical LAD occluded with L-L collats filling apical vessel (too small for PCI), mid RCA 20%, EF 70%.  Med Rx   Cancer [facility])    Chronic kidney disease, stage 3b (HCC)    Colon polyps    Diabetes mellitus    Diagnosed 2012   Diverticulosis of colon (without mention of hemorrhage)    DVT (deep venous thrombosis) (HCC) 12/2022   right leg   GERD (gastroesophageal reflux disease)    Glaucoma    Grade I diastolic dysfunction    Grade I diastolic dysfunction    Herpes zoster without complication 04/24/2020   Hyperlipidemia    intol of statins   Hypertension    Kidney stones    Left atrial dilation    Moderate aortic regurgitation    Motion sickness    back seat cars   NHL (non-Hodgkin's lymphoma) (HCC) dx'd 2003   chemo/xrt comp 2003   Personal history of colonic polyps    Proctitis    Pruritic intertrigo 07/18/2014   Urticaria    Wears dentures    Full upper, partial lower   Wears hearing aid in both ears    Past Surgical History:  Procedure Laterality Date   BIOPSY  10/15/2023   Procedure: BIOPSY;  Surgeon: Aundria, Ladell POUR, MD;  Location: [facility] ENDOSCOPY;  Service: Gastroenterology;;   cardiac cath-neg     CATARACT EXTRACTION Bilateral    COLONOSCOPY WITH PROPOFOL  N/A 10/15/2023   Procedure: COLONOSCOPY WITH PROPOFOL ;  Surgeon: Toledo, Ladell POUR, MD;  Location: [facility] ENDOSCOPY;   Service: Gastroenterology;  Laterality: N/A;   CORONARY ANGIOPLASTY WITH STENT PLACEMENT     x 2   dexa-neg     ESOPHAGOGASTRODUODENOSCOPY N/A 07/13/2024   Procedure: EGD (ESOPHAGOGASTRODUODENOSCOPY);  Surgeon: Jinny Carmine, MD;  Location: [facility] ENDOSCOPY;  Service: Endoscopy;  Laterality: N/A;   ESOPHAGOGASTRODUODENOSCOPY N/A 07/15/2024   Procedure: EGD (ESOPHAGOGASTRODUODENOSCOPY);  Surgeon: Onita Elspeth Sharper, DO;  Location: [facility] ENDOSCOPY;  Service: Gastroenterology;  Laterality: N/A;   ESOPHAGOGASTRODUODENOSCOPY (EGD) WITH PROPOFOL  N/A 10/15/2023   Procedure: ESOPHAGOGASTRODUODENOSCOPY (EGD) WITH PROPOFOL ;  Surgeon: Toledo, Ladell POUR, MD;  Location: [facility] ENDOSCOPY;  Service: Gastroenterology;  Laterality: N/A;   HEMOSTASIS CLIP PLACEMENT  10/15/2023   Procedure: HEMOSTASIS CLIP PLACEMENT;  Surgeon: Aundria, Ladell POUR, MD;  Location: [facility] ENDOSCOPY;  Service: Gastroenterology;;   KNEE SURGERY  10/30/2003   left   LEFT HEART CATHETERIZATION WITH CORONARY ANGIOGRAM N/A 03/22/2014   Procedure: LEFT HEART CATHETERIZATION WITH CORONARY ANGIOGRAM;  Surgeon: Lonni JONETTA Cash, MD;  Location: [facility] CATH LAB;  Service: Cardiovascular;  Laterality: N/A;   POLYPECTOMY  10/15/2023   Procedure: POLYPECTOMY;  Surgeon: Toledo, Ladell POUR, MD;  Location: [facility] ENDOSCOPY;  Service: Gastroenterology;;   stress cardiolite      VAGINAL HYSTERECTOMY     partial, fibroids,  one ovary left   Current Facility-Administered Medications  Medication Dose Route Frequency Provider Last Rate Last Admin   0.9 %  sodium chloride  infusion   Intravenous Continuous Mansouraty, Kemp Gomes Jr., MD        Current Facility-Administered Medications:    0.9 %  sodium chloride  infusion, , Intravenous, Continuous, Mansouraty, Aloha Raddle., MD Allergies  Allergen Reactions   Other Rash and Anaphylaxis   Shellfish Allergy  Anaphylaxis and Rash   Venofer  [Iron  Sucrose] Anaphylaxis    Severe anaphylactic reaction to Venofer -needing  chest compressions; EpiPen .    Cephalexin      REACTION: trash and swelling   Ciprofloxacin     REACTION: u/k   Clopidogrel Bisulfate     REACTION: swelling, rash   Ezetimibe -Simvastatin     REACTION: myalgias   Lidocaine  Hives    ONLY TO ADHESIVE LIDOCAINE  PATCHES. NO PROBLEM WITH INJECTABLE LIDOCAINE .   Nitrofurantoin     REACTION: panama   Pregabalin     REACTION: felt bad   Celecoxib Rash    REACTION: uk   Codeine Rash   Codeine Phosphate Rash   Hydrocod Poli-Chlorphe Poli Er Rash   Propoxyphene Rash   Propoxyphene N-Acetaminophen  Rash   Rofecoxib Rash   Sulfa Antibiotics Rash   Sulfamethoxazole Rash   Sulfonamide Derivatives Rash   Family History  Problem Relation Age of Onset   Cancer Mother        jaw   Hypertension Father    Heart attack Father    Heart attack Sister    Cancer Sister        unknown primary-mets throughout body   Lung cancer Brother    Colon cancer Neg Hx    Esophageal cancer Neg Hx    Pancreatic cancer Neg Hx    Stomach cancer Neg Hx    Liver disease Neg Hx    Social History   Socioeconomic History   Marital status: Widowed    Spouse name: Not on file   Number of children: 0   Years of education: Not on file   Highest education level: 12th grade  Occupational History   Occupation: Retired    Associate Professor: RETIRED   Occupation: retired  Tobacco Use   Smoking status: Never   Smokeless tobacco: Never  Vaping Use   Vaping status: Never Used  Substance and Sexual Activity   Alcohol use: No   Drug use: No   Sexual activity: Not Currently  Other Topics Concern   Not on file  Social History Narrative   Not on file   Social Drivers of Health   Financial Resource Strain: Low Risk  (05/20/2024)   Overall Financial Resource Strain (CARDIA)    Difficulty of Paying Living Expenses: Not hard at all  Food Insecurity: No Food Insecurity (07/23/2024)   Hunger Vital Sign    Worried About Running Out of Food in the Last Year: Never true    Ran  Out of Food in the Last Year: Never true  Transportation Needs: No Transportation Needs (07/23/2024)   PRAPARE - Administrator, Civil Service (Medical): No    Lack of Transportation (Non-Medical): No  Physical Activity: Inactive (05/20/2024)   Exercise Vital Sign    Days of Exercise per Week: 0 days    Minutes of Exercise per Session: Not on file  Stress: No Stress Concern Present (05/20/2024)   [employer] of Occupational Health - Occupational Stress Questionnaire    Feeling of Stress: Not at all  Social Connections: Unknown (07/14/2024)   Social Connection and Isolation Panel    Frequency of Communication with Friends and Family: More than three times a week    Frequency of Social Gatherings with Friends and Family: More than three times a week    Attends Religious Services: Not on file    Active Member of Clubs or Organizations: Yes    Attends Banker Meetings: Not on file    Marital Status: Widowed  Intimate Partner Violence: Not At Risk (07/23/2024)   Humiliation, Afraid, Rape, and Kick questionnaire    Fear of Current or Ex-Partner: No    Emotionally Abused: No    Physically Abused: No    Sexually Abused: No    Physical Exam: There were no vitals filed for this visit. There is no height or weight on file to calculate BMI. GEN: NAD EYE: Sclerae anicteric ENT: MMM CV: Non-tachycardic GI: Soft, NT/ND NEURO:  Alert & Oriented x 3  Lab Results: No results for input(s): WBC, HGB, HCT, PLT in the last 72 hours. BMET No results for input(s): NA, K, CL, CO2, GLUCOSE, BUN, CREATININE, CALCIUM in the last 72 hours. LFT No results for input(s): PROT, ALBUMIN, AST, ALT, ALKPHOS, BILITOT, BILIDIR, IBILI in the last 72 hours. PT/INR No results for input(s): LABPROT, INR in the last 72 hours.   Impression / Plan: This is a 89 y.o.female who presents for EGD/EUS to evaluate and sample gastric SEL concerning  for GIST.  The risks of an EUS including intestinal perforation, bleeding, infection, aspiration, and medication effects were discussed as was the possibility it may not give a definitive diagnosis if a biopsy is performed.  When a biopsy of the pancreas is done as part of the EUS, there is an additional risk of pancreatitis at the rate of about 1-2%.  It was explained that procedure related pancreatitis is typically mild, although it can be severe and even life threatening, which is why we do not perform random pancreatic biopsies and only biopsy a lesion/area we feel is concerning enough to warrant the risk.   The risks and benefits of endoscopic evaluation/treatment were discussed with the patient and/or family; these include but are not limited to the risk of perforation, infection, bleeding, missed lesions, lack of diagnosis, severe illness requiring hospitalization, as well as anesthesia and sedation related illnesses.  The patient's history has been reviewed, patient examined, no change in status, and deemed stable for procedure.  The patient and/or family is agreeable to proceed.    Aloha Finner, MD [facility] Office # [phone]

## 2024-08-09 NOTE — Telephone Encounter (Signed)
 Patient had EGD on Thursday 9/11 she has help Eliquis  for 2 days - GI told patient to follow with Cards to resume Eliquis   Please advise - patient also has NM PET Image Initial (PI) Skull Base To Thigh  scheduled for Friday  You saw her for cards clearance

## 2024-08-09 NOTE — Anesthesia Procedure Notes (Signed)
 Procedure Name: MAC Date/Time: 08/09/2024 10:00 AM  Performed by: Obadiah Reyes BROCKS, CRNAPatient Re-evaluated:Patient Re-evaluated prior to induction Oxygen Delivery Method: Simple face mask Preoxygenation: Pre-oxygenation with 100% oxygen

## 2024-08-09 NOTE — Op Note (Signed)
 [facility] [place] Patient Name: [name] [name] Procedure Date: 08/09/2024 MRN: [id number] Attending MD: Aloha Finner , MD, [id number] Date of Birth: 03/03/1932 CSN: [id number] Age: 89 Admit Type: Outpatient Procedure:                Upper EUS Indications:              Gastric deformity on endoscopy/Subepithelial tumor                            versus extrinsic compression, Suspected mass in                            stomach on CT scan Providers:                Aloha Finner, MD, Robie Breed, RN,                            Lorrayne Kitty, Technician Referring MD:              Medicines:                Monitored Anesthesia Care Complications:            No immediate complications. Estimated Blood Loss:     Estimated blood loss was minimal. Procedure:                Pre-Anesthesia Assessment:                           - Prior to the procedure, a History and Physical                            was performed, and patient medications and                            allergies were reviewed. The patient's tolerance of                            previous anesthesia was also reviewed. The risks                            and benefits of the procedure and the sedation                            options and risks were discussed with the patient.                            All questions were answered, and informed consent                            was obtained. Prior Anticoagulants: The patient has                            taken Eliquis  (apixaban ), last dose was 3 days                            prior  to procedure. ASA Grade Assessment: III - A                            patient with severe systemic disease. After                            reviewing the risks and benefits, the patient was                            deemed in satisfactory condition to undergo the                            procedure.                           After obtaining informed consent, the  endoscope was                            passed under direct vision. Throughout the                            procedure, the patient's blood pressure, pulse, and                            oxygen saturations were monitored continuously. The                            [id number]-[id number] ([id number]) Olympus endoscope was introduced                            through the mouth, and advanced to the second part                            of duodenum. The [id number]-[id number] ([id number]) Olympus                            endosonoscope was introduced through the mouth, and                            advanced to the stomach for ultrasound examination.                            The [id number] ([id number]) Olympus endosonoscope was                            introduced through the mouth, and advanced to the                            duodenum for ultrasound examination from the                            stomach and duodenum. The upper EUS was  accomplished without difficulty. The patient                            tolerated the procedure. Scope In: Scope Out: Findings:      ENDOSCOPIC FINDING: :      No gross lesions were noted in the entire esophagus.      The Z-line was irregular and was found 40 cm from the incisors.      A 1 cm hiatal hernia was present.      Multiple small semi-sessile polyps with no bleeding were found in the       gastric fundus and in the gastric body.      A large, submucosal and ulcerated, non-circumferential mass with no       bleeding and stigmata of recent bleeding was found on the lesser       curvature of the stomach.      No gross lesions were noted in the duodenal bulb, in the first portion       of the duodenum and in the second portion of the duodenum.      ENDOSONOGRAPHIC FINDING: :      A round intramural (subepithelial) lesion was found in the body of the       stomach. The lesion was hypoechoic and lobulated. Sonographically, the       lesion appeared  to originate from the muscularis propria (Layer 4). The       lesion also measured 62 mm by 58 mm in diameter. The outer       endosonographic borders were well defined. Fine needle biopsy was       performed. Color Doppler imaging was utilized prior to needle puncture       to confirm a lack of significant vascular structures within the needle       path. Six passes were made with the 22 gauge Acquire biopsy needle using       a transgastric approach. A visible core of tissue was obtained.       Preliminary cytologic examination and touch preps were performed. The       cellularity of the specimen was adequate. Final cytology results are       pending.      Endosonographic imaging in the visualized portion of the liver showed no       mass.      No malignant-appearing lymph nodes were visualized in the left gastric       region (level 17), gastrohepatic ligament (level 18), splenic region       (level 19), celiac region (level 20) and perigastric region.      The celiace was visualized. Impression:               EGD Impression:                           - No gross lesions in the entire esophagus. Z-line                            irregular, 40 cm from the incisors.                           - 1 cm hiatal hernia.                           -  Multiple gastric polyps.                           - Rule out malignancy, gastric tumor on the lesser                            curvature of the stomach.                           - No gross lesions in the duodenal bulb, in the                            first portion of the duodenum and in the second                            portion of the duodenum.                           EUS Impression:                           - An intramural (subepithelial) lesion was found in                            the body of the stomach. The lesion appeared to                            originate from within the muscularis propria (Layer                             4). Cytology results are pending. 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 Fine                            needle biopsy performed.                           - No malignant-appearing lymph nodes were                            visualized in the left gastric region (level 17),                            gastrohepatic ligament (level 18), splenic region                            (level 19), celiac region (level 20) and                            perigastric region. Cytology  results are pending.                            However, the endosonographic appearance is highly                            suspicious for a stromal cell (smooth muscle)                            neoplasm. Moderate Sedation:      Not Applicable - Patient had care per Anesthesia. Recommendation:           - The patient will be observed post-procedure,                            until all discharge criteria are met.                           - Discharge patient to home.                           - Patient has a contact number available for                            emergencies. The signs and symptoms of potential                            delayed complications were discussed with the                            patient. Return to normal activities tomorrow.                            Written discharge instructions were provided to the                            patient.                           - Observe patient's clinical course.                           - Resume previous diet.                           - Observe patient's clinical course.                           - Monitor for signs/symptoms of bleeding,                            perforation, and infection. If issues please call                            our number to get further assistance as needed.                           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 If she                            has been given the okay by her oncology/PCP, to                            restart Eliquis , I would not restart until 9/13 PM                            to decrease risk of post interventional bleeding,                            but again I have concerns that she may start                            rebleeding if she restarts her medicine..                           - Continue PPI twice daily (prescription sent to                            pharmacy).                           - Carafate  1 g twice daily.                           - Minimize other nonsteroidal medications.                           - The findings and recommendations were discussed                            with the patient.                           - The findings and recommendations were discussed                            with the patient's family. Procedure Code(s):        --- Professional ---  56761, Esophagogastroduodenoscopy, flexible,                            transoral; with transendoscopic ultrasound-guided                            intramural or transmural fine needle                            aspiration/biopsy(s), (includes endoscopic                            ultrasound examination limited to the esophagus,                            stomach or duodenum, and adjacent structures) Diagnosis Code(s):        --- Professional ---                           K22.89, Other specified disease of esophagus                           K44.9, Diaphragmatic hernia without obstruction or                            gangrene                            K31.7, Polyp of stomach and duodenum                           D49.0, Neoplasm of unspecified behavior of                            digestive system                           K31.89, Other diseases of stomach and duodenum                           I89.9, Noninfective disorder of lymphatic vessels                            and lymph nodes, unspecified                           R93.3, Abnormal findings on diagnostic imaging of                            other parts of digestive tract CPT copyright 2022 American [employer]. All rights reserved. The codes documented in this report are preliminary and upon coder review may  be revised to meet current compliance requirements. Aloha Finner, MD 08/09/2024 11:09:30 AM Number of Addenda: 0

## 2024-08-09 NOTE — Transfer of Care (Signed)
 Immediate Anesthesia Transfer of Care Note  Patient: [name] [name]  Procedure(s) Performed: ULTRASOUND, UPPER GI TRACT, ENDOSCOPIC  Patient Location: PACU and Endoscopy Unit  Anesthesia Type:MAC  Level of Consciousness: awake, alert , and oriented  Airway & Oxygen Therapy: Patient Spontanous Breathing and Patient connected to nasal cannula oxygen  Post-op Assessment: Report given to RN and Post -op Vital signs reviewed and stable  Post vital signs: Reviewed and stable  Last Vitals:  Vitals Value Taken Time  BP 132/80 08/09/24 10:43  Temp 36 C 08/09/24 10:43  Pulse 83 08/09/24 10:45  Resp 18 08/09/24 10:45  SpO2 100 % 08/09/24 10:45  Vitals shown include unfiled device data.  Last Pain:  Vitals:   08/09/24 1043  TempSrc: Temporal  PainSc: Asleep         Complications: No notable events documented.

## 2024-08-09 NOTE — Discharge Instructions (Signed)
YOU HAD AN ENDOSCOPIC PROCEDURE TODAY: Refer to the procedure report and other information in the discharge instructions given to you for any specific questions about what was found during the examination. If this information does not answer your questions, please call [facility] office at [phone] to clarify.   YOU SHOULD EXPECT: Some feelings of bloating in the abdomen. Passage of more gas than usual. Walking can help get rid of the air that was put into your GI tract during the procedure and reduce the bloating. If you had a lower endoscopy (such as a colonoscopy or flexible sigmoidoscopy) you may notice spotting of blood in your stool or on the toilet paper. Some abdominal soreness may be present for a day or two, also.  DIET: Your first meal following the procedure should be a light meal and then it is ok to progress to your normal diet. A half-sandwich or bowl of soup is an example of a good first meal. Heavy or fried foods are harder to digest and may make you feel nauseous or bloated. Drink plenty of fluids but you should avoid alcoholic beverages for 24 hours. If you had a esophageal dilation, please see attached instructions for diet.    ACTIVITY: Your care partner should take you home directly after the procedure. You should plan to take it easy, moving slowly for the rest of the day. You can resume normal activity the day after the procedure however YOU SHOULD NOT DRIVE, use power tools, machinery or perform tasks that involve climbing or major physical exertion for 24 hours (because of the sedation medicines used during the test).   SYMPTOMS TO REPORT IMMEDIATELY: A gastroenterologist can be reached at any hour. Please call [phone]  for any of the following symptoms:  Following lower endoscopy (colonoscopy, flexible sigmoidoscopy) Excessive amounts of blood in the stool  Significant tenderness, worsening of abdominal pains  Swelling of the abdomen that is new, acute  Fever of 100 or  higher  Following upper endoscopy (EGD, EUS, ERCP, esophageal dilation) Vomiting of blood or coffee ground material  New, significant abdominal pain  New, significant chest pain or pain under the shoulder blades  Painful or persistently difficult swallowing  New shortness of breath  Black, tarry-looking or red, bloody stools  FOLLOW UP:  If any biopsies were taken you will be contacted by phone or by letter within the next 1-3 weeks. Call [phone]  if you have not heard about the biopsies in 3 weeks.  Please also call with any specific questions about appointments or follow up tests.YOU HAD AN ENDOSCOPIC PROCEDURE TODAY: Refer to the procedure report and other information in the discharge instructions given to you for any specific questions about what was found during the examination. If this information does not answer your questions, please call [facility] office at [phone] to clarify.   YOU SHOULD EXPECT: Some feelings of bloating in the abdomen. Passage of more gas than usual. Walking can help get rid of the air that was put into your GI tract during the procedure and reduce the bloating. If you had a lower endoscopy (such as a colonoscopy or flexible sigmoidoscopy) you may notice spotting of blood in your stool or on the toilet paper. Some abdominal soreness may be present for a day or two, also.  DIET: Your first meal following the procedure should be a light meal and then it is ok to progress to your normal diet. A half-sandwich or bowl of soup is an example of a  good first meal. Heavy or fried foods are harder to digest and may make you feel nauseous or bloated. Drink plenty of fluids but you should avoid alcoholic beverages for 24 hours. If you had a esophageal dilation, please see attached instructions for diet.    ACTIVITY: Your care partner should take you home directly after the procedure. You should plan to take it easy, moving slowly for the rest of the day. You can resume  normal activity the day after the procedure however YOU SHOULD NOT DRIVE, use power tools, machinery or perform tasks that involve climbing or major physical exertion for 24 hours (because of the sedation medicines used during the test).   SYMPTOMS TO REPORT IMMEDIATELY: A gastroenterologist can be reached at any hour. Please call [phone]  for any of the following symptoms:  Following upper endoscopy (EGD, EUS, ERCP, esophageal dilation) Vomiting of blood or coffee ground material  New, significant abdominal pain  New, significant chest pain or pain under the shoulder blades  Painful or persistently difficult swallowing  New shortness of breath  Black, tarry-looking or red, bloody stools  FOLLOW UP:  If any biopsies were taken you will be contacted by phone or by letter within the next 1-3 weeks. Call [phone]  if you have not heard about the biopsies in 3 weeks.  Please also call with any specific questions about appointments or follow up tests.

## 2024-08-09 NOTE — Telephone Encounter (Signed)
 Pt calling in regards to her testing tomorrow. Please advise.   Pt c/o medication issue:  1. Name of Medication: apixaban  (ELIQUIS ) 5 MG TABS tablet    2. How are you currently taking this medication (dosage and times per day)? Take 1 tablet (5 mg total) by mouth 2 (two) times daily.   3. Are you having a reaction (difficulty breathing--STAT)? No  4. What is your medication issue? Pt is not sure when to continue her Eliquis . Please advise

## 2024-08-10 ENCOUNTER — Ambulatory Visit
Admission: RE | Admit: 2024-08-10 | Discharge: 2024-08-10 | Disposition: A | Discharge status: Home / Self Care | Source: Ambulatory Visit | Attending: Internal Medicine | Admitting: Internal Medicine

## 2024-08-10 DIAGNOSIS — M4317 Spondylolisthesis, lumbosacral region: Secondary | ICD-10-CM | POA: Insufficient documentation

## 2024-08-10 DIAGNOSIS — M51379 Other intervertebral disc degeneration, lumbosacral region without mention of lumbar back pain or lower extremity pain: Secondary | ICD-10-CM | POA: Insufficient documentation

## 2024-08-10 DIAGNOSIS — C3432 Malignant neoplasm of lower lobe, left bronchus or lung: Secondary | ICD-10-CM | POA: Diagnosis not present

## 2024-08-10 DIAGNOSIS — S2220XA Unspecified fracture of sternum, initial encounter for closed fracture: Secondary | ICD-10-CM | POA: Insufficient documentation

## 2024-08-10 DIAGNOSIS — M47816 Spondylosis without myelopathy or radiculopathy, lumbar region: Secondary | ICD-10-CM | POA: Diagnosis not present

## 2024-08-10 DIAGNOSIS — X58XXXA Exposure to other specified factors, initial encounter: Secondary | ICD-10-CM | POA: Insufficient documentation

## 2024-08-10 DIAGNOSIS — I700 Atherosclerosis of aorta: Secondary | ICD-10-CM | POA: Insufficient documentation

## 2024-08-10 DIAGNOSIS — K3189 Other diseases of stomach and duodenum: Secondary | ICD-10-CM | POA: Diagnosis not present

## 2024-08-10 DIAGNOSIS — R918 Other nonspecific abnormal finding of lung field: Secondary | ICD-10-CM | POA: Insufficient documentation

## 2024-08-10 LAB — GLUCOSE, CAPILLARY: Glucose-Capillary: 176 mg/dL — ABNORMAL HIGH (ref 70–99)

## 2024-08-10 MED ORDER — FLUDEOXYGLUCOSE F - 18 (FDG) INJECTION
176.0000 | Freq: Once | INTRAVENOUS | Status: AC | PRN
  Administered 2024-08-10: 9.27 via INTRAVENOUS

## 2024-08-12 ENCOUNTER — Encounter (HOSPITAL_COMMUNITY): Payer: Self-pay | Admitting: Gastroenterology

## 2024-08-13 NOTE — Anesthesia Postprocedure Evaluation (Signed)
 Anesthesia Post Note  Patient: [name] [name]  Procedure(s) Performed: ULTRASOUND, UPPER GI TRACT, ENDOSCOPIC EGD (ESOPHAGOGASTRODUODENOSCOPY) FINE NEEDLE ASPIRATION     Patient location during evaluation: Endoscopy Anesthesia Type: MAC Level of consciousness: awake and alert Pain management: pain level controlled Vital Signs Assessment: post-procedure vital signs reviewed and stable Respiratory status: spontaneous breathing Cardiovascular status: stable Anesthetic complications: no   No notable events documented.  Last Vitals:  Vitals:   08/09/24 1110 08/09/24 1120  BP: (!) 156/88 (!) 140/88  Pulse: (!) 123 85  Resp: 15 15  Temp:    SpO2: 93% 92%    Last Pain:  Vitals:   08/10/24 1659  TempSrc:   PainSc: 0-No pain                 Norleen Pope

## 2024-08-13 NOTE — Telephone Encounter (Signed)
 Called and spoke with the patient to inform her that she could restart her Eliquis  post GI procedure.  Patient stated that she restarted Eliquis  on 09/10/24.

## 2024-08-13 NOTE — Telephone Encounter (Signed)
 Called and left message for call back.

## 2024-08-13 NOTE — Telephone Encounter (Signed)
 Pt returning call

## 2024-08-14 LAB — CYTOLOGY - NON PAP

## 2024-08-15 ENCOUNTER — Ambulatory Visit: Payer: Self-pay | Admitting: Gastroenterology

## 2024-08-16 ENCOUNTER — Other Ambulatory Visit: Payer: Self-pay

## 2024-08-16 ENCOUNTER — Ambulatory Visit

## 2024-08-16 ENCOUNTER — Inpatient Hospital Stay: Attending: Internal Medicine

## 2024-08-16 ENCOUNTER — Inpatient Hospital Stay: Admitting: Internal Medicine

## 2024-08-16 VITALS — BP 143/89 | HR 82 | Temp 96.0°F | Resp 16 | Ht 69.0 in | Wt 161.9 lb

## 2024-08-16 DIAGNOSIS — Z7901 Long term (current) use of anticoagulants: Secondary | ICD-10-CM | POA: Insufficient documentation

## 2024-08-16 DIAGNOSIS — Z801 Family history of malignant neoplasm of trachea, bronchus and lung: Secondary | ICD-10-CM | POA: Insufficient documentation

## 2024-08-16 DIAGNOSIS — I4891 Unspecified atrial fibrillation: Secondary | ICD-10-CM | POA: Diagnosis not present

## 2024-08-16 DIAGNOSIS — D509 Iron deficiency anemia, unspecified: Secondary | ICD-10-CM | POA: Insufficient documentation

## 2024-08-16 DIAGNOSIS — Z808 Family history of malignant neoplasm of other organs or systems: Secondary | ICD-10-CM | POA: Diagnosis not present

## 2024-08-16 DIAGNOSIS — D371 Neoplasm of uncertain behavior of stomach: Secondary | ICD-10-CM | POA: Insufficient documentation

## 2024-08-16 DIAGNOSIS — C801 Malignant (primary) neoplasm, unspecified: Secondary | ICD-10-CM | POA: Insufficient documentation

## 2024-08-16 DIAGNOSIS — K3189 Other diseases of stomach and duodenum: Secondary | ICD-10-CM

## 2024-08-16 LAB — MISCELLANEOUS TEST

## 2024-08-16 LAB — CBC WITH DIFFERENTIAL (CANCER CENTER ONLY)
Abs Immature Granulocytes: 0.01 K/uL (ref 0.00–0.07)
Basophils Absolute: 0 K/uL (ref 0.0–0.1)
Basophils Relative: 0 %
Eosinophils Absolute: 0.1 K/uL (ref 0.0–0.5)
Eosinophils Relative: 2 %
HCT: 34.7 % — ABNORMAL LOW (ref 36.0–46.0)
Hemoglobin: 11.5 g/dL — ABNORMAL LOW (ref 12.0–15.0)
Immature Granulocytes: 0 %
Lymphocytes Relative: 31 %
Lymphs Abs: 1 K/uL (ref 0.7–4.0)
MCH: 33 pg (ref 26.0–34.0)
MCHC: 33.1 g/dL (ref 30.0–36.0)
MCV: 99.7 fL (ref 80.0–100.0)
Monocytes Absolute: 0.3 K/uL (ref 0.1–1.0)
Monocytes Relative: 10 %
Neutro Abs: 1.7 K/uL (ref 1.7–7.7)
Neutrophils Relative %: 57 %
Platelet Count: 220 K/uL (ref 150–400)
RBC: 3.48 MIL/uL — ABNORMAL LOW (ref 3.87–5.11)
RDW: 15.5 % (ref 11.5–15.5)
WBC Count: 3.1 K/uL — ABNORMAL LOW (ref 4.0–10.5)
nRBC: 0 % (ref 0.0–0.2)

## 2024-08-16 LAB — SAMPLE TO BLOOD BANK

## 2024-08-16 NOTE — Progress Notes (Signed)
 [facility] CONSULT NOTE  Patient Care Team: Gretta Comer POUR, NP as PCP - General (Internal Medicine) Perla Evalene PARAS, MD as PCP - Cardiology (Cardiology) Alline Lenis, MD (Inactive) as Consulting Physician (Urology) Portia Fireman, OD as Consulting Physician (Optometry) Homsher, Wanda, RN as [facility] Rennie Cindy SAUNDERS, MD as Consulting Physician (Oncology) Maurie Rayfield BIRCH, RN as Oncology Nurse Navigator  CHIEF COMPLAINTS/PURPOSE OF CONSULTATION: gastric mass  Oncology History Overview Note  Gastric tumor- AUG 2025- CT A/P: Large mass within the lumen of the stomach-  Large round well-circumscribed mass in the mid stomach measures 6.3 x 6.3 cm (image 63) mass is contained within lumen of the stomach.. No evidence of metastatic adenopathy in the abdomen pelvis. No liver metastasis. No peritoneal metastasis. CT chest:  Solitary pulmonary nodule in the RIGHT lower lobe is new from 2014. Indeterminate finding. FDG PET scan may aid in determining malignant potential. 2. No mediastinal adenopathy.  PET scan:  6.6 cm gastric mass has diffuse low-grade metabolic activity, 1.0 cm left lower lobe pulmonary nodule has a maximum SUV of 2.5, borderline for malignancy. This nodule was not present on 08/09/2013.   # AUG 2025EGD biops-y negative/inconclusive.  SEP 2025- S/p endoscopic ultrasound with biopsy [Dr.Mansouraty, [place]]- positive for spindle cell neoplasm.  EUS-suggestive of subepithelial/from the muscularis.  however, given the a cellularity-unable to perform IHC stains-differential diagnosis includes GIST tumor/leiomyoma vs others.  Ordered-liquid biopsy.     # NHL/of the bowel - [[facility]]treated >25  y ago- RT.  Chemo x 6- followed by RT- No surgery.    Malignant spindle cell neoplasm (HCC)  08/16/2024 Initial Diagnosis   Malignant spindle cell neoplasm (HCC)     HISTORY OF PRESENTING ILLNESS:   Accompanied by family.   [name] [name] 89 y.o.  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  She is here to review the results of the PET scan and also the recent EUS with biopsy.  At last visit unfortunately patient has severe anaphylactic reaction to IV iron -needing chest compressions/stabilized after epi injection.  EMS called-patient will be transported to the hospital. will discontinue IV iron  infusion.   Patient re-started back on eliquis -however denies any blood in stools or black-colored stool.  Denies any hematemesis.  Complains of ongoing mild chest pain-secondary to recent chest compressions.  Review of Systems  Constitutional:  Positive for malaise/fatigue. Negative for chills, diaphoresis, fever and weight loss.  HENT:  Negative for nosebleeds and sore throat.   Eyes:  Negative for double vision.  Respiratory:  Negative for cough, hemoptysis, sputum production, shortness of breath and wheezing.   Cardiovascular:  Negative for chest pain, palpitations, orthopnea and leg swelling.  Gastrointestinal:  Negative for abdominal pain, blood in stool, constipation, diarrhea, heartburn, melena, nausea and vomiting.  Genitourinary:  Negative for dysuria, frequency and urgency.  Musculoskeletal:  Negative for back pain and joint pain.  Skin: Negative.  Negative for itching and rash.  Neurological:  Negative for dizziness, tingling, focal weakness, weakness and headaches.  Endo/Heme/Allergies:  Does not bruise/bleed easily.  Psychiatric/Behavioral:  Negative for depression. The patient is not nervous/anxious and does not have insomnia.     MEDICAL HISTORY:  Past Medical History:  Diagnosis Date   Acute bilateral thoracic back pain 08/16/2019   Acute post-hemorrhagic  anemia 10/10/2023   Allergy     Cipro, Plavix, Statins   Anemia     Arthritis    Atrial fibrillation (HCC)    CAD (coronary artery disease)    a. s/p tandem Promus DES to LAD in 2009 by Dr. Obie;  b.  LHC (03/22/14):  prox LAD 30%, mid LAD 40%, LAD stent ok with dist 20% ISR, apical LAD occluded with L-L collats filling apical vessel (too small for PCI), mid RCA 20%, EF 70%.  Med Rx   Cancer [facility])    Chronic kidney disease, stage 3b (HCC)    Colon polyps    Diabetes mellitus    Diagnosed 2012   Diverticulosis of colon (without mention of hemorrhage)    DVT (deep venous thrombosis) (HCC) 12/2022   right leg   GERD (gastroesophageal reflux disease)    Glaucoma    Grade I diastolic dysfunction    Grade I diastolic dysfunction    Herpes zoster without complication 04/24/2020   Hyperlipidemia    intol of statins   Hypertension    Kidney stones    Left atrial dilation    Moderate aortic regurgitation    Motion sickness    back seat cars   NHL (non-Hodgkin's lymphoma) (HCC) dx'd 2003   chemo/xrt comp 2003   Personal history of colonic polyps    Proctitis    Pruritic intertrigo 07/18/2014   Urticaria    Wears dentures    Full upper, partial lower   Wears hearing aid in both ears     SURGICAL HISTORY: Past Surgical History:  Procedure Laterality Date   BIOPSY  10/15/2023   Procedure: BIOPSY;  Surgeon: Aundria, Ladell POUR, MD;  Location: [facility] ENDOSCOPY;  Service: Gastroenterology;;   cardiac cath-neg     CATARACT EXTRACTION Bilateral    COLONOSCOPY WITH PROPOFOL  N/A 10/15/2023   Procedure: COLONOSCOPY WITH PROPOFOL ;  Surgeon: Toledo, Ladell POUR, MD;  Location: [facility] ENDOSCOPY;  Service: Gastroenterology;  Laterality: N/A;   CORONARY ANGIOPLASTY WITH STENT PLACEMENT     x 2   dexa-neg     ESOPHAGOGASTRODUODENOSCOPY N/A 07/13/2024   Procedure: EGD (ESOPHAGOGASTRODUODENOSCOPY);  Surgeon: Jinny Carmine, MD;  Location: [facility] ENDOSCOPY;  Service: Endoscopy;  Laterality: N/A;   ESOPHAGOGASTRODUODENOSCOPY N/A 07/15/2024   Procedure: EGD  (ESOPHAGOGASTRODUODENOSCOPY);  Surgeon: Onita Elspeth Sharper, DO;  Location: [facility] ENDOSCOPY;  Service: Gastroenterology;  Laterality: N/A;   ESOPHAGOGASTRODUODENOSCOPY N/A 08/09/2024   Procedure: EGD (ESOPHAGOGASTRODUODENOSCOPY);  Surgeon: Wilhelmenia Aloha Raddle., MD;  Location: [facility] ENDOSCOPY;  Service: Gastroenterology;  Laterality: N/A;   ESOPHAGOGASTRODUODENOSCOPY (EGD) WITH PROPOFOL  N/A 10/15/2023   Procedure: ESOPHAGOGASTRODUODENOSCOPY (EGD) WITH PROPOFOL ;  Surgeon: Toledo, Ladell POUR, MD;  Location: [facility] ENDOSCOPY;  Service: Gastroenterology;  Laterality: N/A;   EUS N/A 08/09/2024   Procedure: ULTRASOUND, UPPER GI TRACT, ENDOSCOPIC;  Surgeon: Wilhelmenia Aloha Raddle., MD;  Location: [facility] ENDOSCOPY;  Service: Gastroenterology;  Laterality: N/A;   FINE NEEDLE ASPIRATION  08/09/2024   Procedure: FINE NEEDLE ASPIRATION;  Surgeon: Wilhelmenia Aloha Raddle., MD;  Location: [facility] ENDOSCOPY;  Service: Gastroenterology;;   HEMOSTASIS CLIP PLACEMENT  10/15/2023   Procedure: HEMOSTASIS CLIP PLACEMENT;  Surgeon: Aundria, Ladell POUR, MD;  Location: [facility] ENDOSCOPY;  Service: Gastroenterology;;   KNEE SURGERY  10/30/2003   left   LEFT HEART CATHETERIZATION WITH CORONARY ANGIOGRAM N/A 03/22/2014   Procedure: LEFT HEART CATHETERIZATION WITH CORONARY ANGIOGRAM;  Surgeon: Lonni JONETTA Cash, MD;  Location: [facility] CATH LAB;  Service: Cardiovascular;  Laterality: N/A;   POLYPECTOMY  10/15/2023   Procedure: POLYPECTOMY;  Surgeon: Toledo, Ladell POUR, MD;  Location: [facility] ENDOSCOPY;  Service: Gastroenterology;;   stress cardiolite      VAGINAL HYSTERECTOMY     partial, fibroids, one ovary left    SOCIAL HISTORY: Social History   Socioeconomic History   Marital status: Widowed    Spouse name: Not on file   Number of children: 0   Years of education: Not on file   Highest education level: 12th grade  Occupational History   Occupation: Retired    Associate Professor: RETIRED   Occupation: retired  Tobacco Use   Smoking status:  Never   Smokeless tobacco: Never  Vaping Use   Vaping status: Never Used  Substance and Sexual Activity   Alcohol use: No   Drug use: No   Sexual activity: Not Currently  Other Topics Concern   Not on file  Social History Narrative   Not on file   Social Drivers of Health   Financial Resource Strain: Low Risk  (05/20/2024)   Overall Financial Resource Strain (CARDIA)    Difficulty of Paying Living Expenses: Not hard at all  Food Insecurity: No Food Insecurity (07/23/2024)   Hunger Vital Sign    Worried About Running Out of Food in the Last Year: Never true    Ran Out of Food in the Last Year: Never true  Transportation Needs: No Transportation Needs (07/23/2024)   PRAPARE - Administrator, Civil Service (Medical): No    Lack of Transportation (Non-Medical): No  Physical Activity: Inactive (05/20/2024)   Exercise Vital Sign    Days of Exercise per Week: 0 days    Minutes of Exercise per Session: Not on file  Stress: No Stress Concern Present (05/20/2024)   [employer] of Occupational Health - Occupational Stress Questionnaire    Feeling of Stress: Not at all  Social Connections: Unknown (07/14/2024)   Social Connection and Isolation Panel    Frequency of Communication with Friends and Family: More than three times a week    Frequency of Social Gatherings with Friends and Family: More than three times a week    Attends Religious Services: Not on file    Active Member of Clubs or Organizations: Yes    Attends Banker Meetings: Not on file    Marital Status: Widowed  Intimate Partner Violence: Not At Risk (07/23/2024)   Humiliation, Afraid, Rape, and Kick questionnaire    Fear of Current or Ex-Partner: No    Emotionally Abused: No    Physically Abused: No    Sexually Abused: No    FAMILY HISTORY: Family History  Problem Relation Age of Onset   Cancer Mother        jaw   Hypertension Father    Heart attack Father    Heart attack Sister     Cancer Sister        unknown primary-mets throughout body   Lung cancer Brother    Colon cancer Neg Hx    Esophageal cancer Neg Hx    Pancreatic cancer Neg Hx    Stomach cancer Neg Hx    Liver disease Neg Hx     ALLERGIES:  is allergic to other, shellfish allergy , venofer  [iron  sucrose], cephalexin , ciprofloxacin, clopidogrel bisulfate, ezetimibe -simvastatin, lidocaine , nitrofurantoin, pregabalin, celecoxib, codeine, codeine phosphate, hydrocod poli-chlorphe poli er, propoxyphene, propoxyphene n-acetaminophen , rofecoxib, sulfa antibiotics, sulfamethoxazole, and sulfonamide derivatives.  MEDICATIONS:  Current Outpatient Medications  Medication Sig Dispense Refill   apixaban  (ELIQUIS ) 5 MG TABS tablet Take 1 tablet (5  mg total) by mouth 2 (two) times daily. 180 tablet 1   Bempedoic Acid -Ezetimibe  (NEXLIZET ) 180-10 MG TABS Take 1 tablet by mouth daily. Pt has scheduled an office visit with provider in April, 2025 90 tablet 2   Blood Glucose Monitoring Suppl DEVI 1 each by Does not apply route in the morning, at noon, and at bedtime. May substitute to any manufacturer covered by patient's insurance. 1 each 0   brimonidine  (ALPHAGAN ) 0.2 % ophthalmic solution Place 1 drop into the right eye in the morning and at bedtime.     Cholecalciferol  (VITAMIN D -3) 1000 UNITS CAPS Take 1 capsule by mouth daily.     CRANBERRY PO Take 1 tablet by mouth daily.     diclofenac  Sodium (VOLTAREN ) 1 % GEL Apply 4 g topically 4 (four) times daily. To your chest wall 350 g 1   Dulaglutide  (TRULICITY ) 0.75 MG/0.5ML SOAJ Inject 0.75 mg into the skin once a week. for diabetes. 6 mL 1   ezetimibe  (ZETIA ) 10 MG tablet Take 1 tablet (10 mg total) by mouth daily. 90 tablet 1   ferrous sulfate  325 (65 FE) MG tablet Take 1 tablet (325 mg total) by mouth daily with breakfast. 30 tablet 2   fexofenadine  (ALLEGRA ) 180 MG tablet Take 1 tablet (180 mg total) by mouth as needed for allergies or rhinitis. 90 tablet 0   furosemide   (LASIX ) 20 MG tablet Take 1 tablet by mouth daily. May take additional tablet as needed for swelling 180 tablet 3   gabapentin  (NEURONTIN ) 100 MG capsule Take 200 mg by mouth 2 (two) times daily.     Glucose Blood (BLOOD GLUCOSE TEST STRIPS) STRP 1 each by In Vitro route in the morning, at noon, and at bedtime. May substitute to any manufacturer covered by patient's insurance. 300 strip 5   Lancets Misc. MISC 1 each by Does not apply route in the morning, at noon, and at bedtime. May substitute to any manufacturer covered by patient's insurance. 300 each 5   metoprolol  succinate (TOPROL -XL) 100 MG 24 hr tablet TAKE 1 TABLET BY MOUTH EVERY MORNING WITH OR IMMEDIATELY FOLLOWING A MEAL 90 tablet 3   nitroGLYCERIN  (NITROSTAT ) 0.4 MG SL tablet Place 1 tablet (0.4 mg total) under the tongue every 5 (five) minutes as needed for chest pain. 25 tablet 2   ondansetron  (ZOFRAN -ODT) 4 MG disintegrating tablet Take 1 tablet (4 mg total) by mouth every 8 (eight) hours as needed for nausea or vomiting. 20 tablet 0   pantoprazole  (PROTONIX ) 40 MG tablet Take 1 tablet (40 mg total) by mouth 2 (two) times daily. TAKE 1 TABLET BY MOUTH DAILY FOR HEARTBURN 60 tablet 12   sertraline  (ZOLOFT ) 100 MG tablet TAKE 1 TABLET BY MOUTH DAILY FOR ANXIETY AND DEPRESSION 90 tablet 0   sucralfate  (CARAFATE ) 1 g tablet Take 1 tablet (1 g total) by mouth 2 (two) times daily. 60 tablet 2   TOBRADEX ophthalmic ointment Place 1 Application into the right eye 3 (three) times daily.     vitamin B-12 (CYANOCOBALAMIN ) 1000 MCG tablet Take 1,000 mcg by mouth daily.     No current facility-administered medications for this visit.    PHYSICAL EXAMINATION:   Vitals:   08/16/24 0900 08/16/24 0925  BP: (!) 148/90 (!) 143/89  Pulse: 82   Resp: 16   Temp: (!) 96 F (35.6 C)   SpO2: 99%     Filed Weights   08/16/24 0900  Weight: 161 lb 14.4 oz (73.4 kg)  Physical Exam Vitals and nursing note reviewed.  HENT:     Head:  Normocephalic and atraumatic.     Mouth/Throat:     Pharynx: Oropharynx is clear.  Eyes:     Extraocular Movements: Extraocular movements intact.     Pupils: Pupils are equal, round, and reactive to light.  Cardiovascular:     Rate and Rhythm: Normal rate and regular rhythm.  Pulmonary:     Comments: Decreased breath sounds bilaterally.  Abdominal:     Palpations: Abdomen is soft.  Musculoskeletal:        General: Normal range of motion.     Cervical back: Normal range of motion.  Skin:    General: Skin is warm.  Neurological:     General: No focal deficit present.     Mental Status: She is alert and oriented to person, place, and time.  Psychiatric:        Behavior: Behavior normal.        Judgment: Judgment normal.     LABORATORY DATA:  I have reviewed the data as listed Lab Results  Component Value Date   WBC 3.1 (L) 08/16/2024   HGB 11.5 (L) 08/16/2024   HCT 34.7 (L) 08/16/2024   MCV 99.7 08/16/2024   PLT 220 08/16/2024   Recent Labs    07/14/24 0539 07/15/24 0513 07/16/24 0434 07/17/24 0540 07/23/24 1345 07/23/24 1610 07/24/24 0636  NA 137 139 141   < > 137 138 140  K 4.1 3.8 4.3   < > 3.9 3.1* 4.8  CL 104 109 107   < > 104 104 108  CO2 27 23 26    < > 25 24 27   GLUCOSE 129* 144* 142*   < > 189* 160* 222*  BUN 38* 22 14   < > 10 10 13   CREATININE 1.31* 1.16* 1.28*   < > 1.14* 1.09* 1.01*  CALCIUM 8.2* 8.2* 8.4*   < > 8.6* 8.4* 8.6*  GFRNONAA 39* 45* 40*   < > 46* 48* 53*  PROT 5.0* 4.9* 5.2*  --  6.2* 6.6  --   ALBUMIN 3.0* 3.0* 3.1*  --  3.5 3.5  --   AST 25 24 23   --  20 25  --   ALT 11 11 12   --  12 12  --   ALKPHOS 21* 23* 25*  --  39 38  --   BILITOT 0.8 0.8 0.9  --  0.7 0.9  --   BILIDIR 0.1 0.2 0.2  --   --   --   --   IBILI 0.7 0.6 0.7  --   --   --   --    < > = values in this interval not displayed.    RADIOGRAPHIC STUDIES: I have personally reviewed the radiological images as listed and agreed with the findings in the report. NM PET  Image Initial (PI) Skull Base To Thigh (F-18 FDG) Result Date: 08/10/2024 CLINICAL DATA:  Initial treatment strategy for gastric mass. EXAM: NUCLEAR MEDICINE PET SKULL BASE TO THIGH TECHNIQUE: 9.3 mCi F-18 FDG was injected intravenously. Full-ring PET imaging was performed from the skull base to thigh after the radiotracer. CT data was obtained and used for attenuation correction and anatomic localization. Fasting blood glucose: 176 mg/dl COMPARISON:  1/84/7974 FINDINGS: Mediastinal blood pool activity: SUV max 3.0 Liver activity: SUV max NA NECK: No significant abnormal hypermetabolic activity in this region. Incidental CT findings: Chronic bilateral maxillary sinusitis. Bilateral common carotid  atheromatous vascular calcification. CHEST: 1.0 cm left lower lobe pulmonary nodule on image 70 series 6 has a maximum SUV of 2.5, borderline for malignancy. This nodule was not present on 08/09/2013. Incidental CT findings: Atelectasis or scarring in the lingula, right middle lobe, and right lower lobe. Coronary, aortic arch, and branch vessel atherosclerotic vascular disease. Moderate cardiomegaly. ABDOMEN/PELVIS: Proximally 6.6 cm gastric mass on image 91 series 6 has diffuse low-grade metabolic activity with maximum SUV 3.2, just above blood pool. No splenomegaly or splenic lesion observed. Incidental CT findings: Atherosclerosis is present, including aortoiliac atherosclerotic disease. Benign peripelvic renal cysts warrant no further imaging workup. Sigmoid diverticulosis. Uterus absent. SKELETON: New fracture of the upper sternal body extending into the manubriosternal joint, maximum SUV 5.2, felt to be posttraumatic rather than malignant. Faintly accentuated metabolic activity anteriorly in the right second, fourth, and fifth ribs compatible with nondisplaced fractures. Incidental CT findings: Lower lumbar spondylosis and degenerative disc disease with grade 1 anterolisthesis at L5-S1 attributable to bilateral  chronic pars defects. IMPRESSION: 1. 6.6 cm gastric mass has diffuse low-grade metabolic activity, just above blood pool. 2. 1.0 cm left lower lobe pulmonary nodule has a maximum SUV of 2.5, borderline for malignancy. This nodule was not present on 08/09/2013. A small lung cancer is not excluded. 3. New fracture of the upper sternal body extending into the manubriosternal joint. Faintly accentuated metabolic activity anteriorly in the right second, fourth, and fifth ribs compatible with nondisplaced fractures. Reportedly the patient received chest compressions around 07/23/2024. 4. Chronic bilateral maxillary sinusitis. 5. Lower lumbar spondylosis and degenerative disc disease with grade 1 anterolisthesis at L5-S1 attributable to bilateral chronic pars defects. 6.  Aortic Atherosclerosis (ICD10-I70.0). Electronically Signed   By: Ryan Salvage M.D.   On: 08/10/2024 11:48   CT HEAD WO CONTRAST ( ) Result Date: 07/23/2024 CLINICAL DATA:  Syncope, anticoagulation, evaluate for intracranial hemorrhage EXAM: CT HEAD WITHOUT CONTRAST TECHNIQUE: Contiguous axial images were obtained from the base of the skull through the vertex without intravenous contrast. RADIATION DOSE REDUCTION: This exam was performed according to the departmental dose-optimization program which includes automated exposure control, adjustment of the mA and/or kV according to patient size and/or use of iterative reconstruction technique. COMPARISON:  10/09/2023 FINDINGS: Brain: No evidence of acute infarction, hemorrhage, hydrocephalus, extra-axial collection or mass lesion/mass effect. Vascular: No hyperdense vessel or unexpected calcification. Skull: Normal. Negative for fracture or focal lesion. Sinuses/Orbits: No acute finding. Chronic mucosal thickening and bony sinus wall thickening of the partially imaged maxillary sinuses. Other: None. IMPRESSION: 1. No acute intracranial pathology. 2. Chronic sinusitis, partially imaged. Electronically  Signed   By: Marolyn JONETTA Jaksch M.D.   On: 07/23/2024 19:11   DG Chest Portable 1 View Result Date: 07/23/2024 EXAM: 1 VIEW XRAY OF THE CHEST 07/23/2024 04:20:00 PM COMPARISON: 11/20/2022 CLINICAL HISTORY: Cardiac arrest? Got brief chest compressions. FINDINGS: LUNGS AND PLEURA: No significant pleural effusion or pneumothorax. Subsegmental atelectasis versus scar in the left base. No airspace consolidation. HEART AND MEDIASTINUM: Mild enlargement of the cardiac silhouette. Aortic atherosclerotic calcification. BONES AND SOFT TISSUES: Visualized osseous structures appear grossly intact. Unchanged asymmetric elevation of the right hemidiaphragm. IMPRESSION: 1. Left base subsegmental atelectasis. 2. Aortic atherosclerosis and mild enlargement of the cardiac silhouette, Electronically signed by: Waddell Calk MD 07/23/2024 04:48 PM EDT RP Workstation: [id number]   US  Venous Img Upper Uni Right(DVT) Result Date: 07/19/2024 CLINICAL DATA:  Right upper arm pain and edema. Prior right arm IV placement. EXAM: RIGHT UPPER EXTREMITY VENOUS DOPPLER ULTRASOUND TECHNIQUE:  Gray-scale sonography with graded compression, as well as color Doppler and duplex ultrasound were performed to evaluate the upper extremity deep venous system from the level of the subclavian vein and including the jugular, axillary, basilic, radial, ulnar and upper cephalic vein. Spectral Doppler was utilized to evaluate flow at rest and with distal augmentation maneuvers. COMPARISON:  None Available. FINDINGS: Contralateral Subclavian Vein: Respiratory phasicity is normal and symmetric with the symptomatic side. No evidence of thrombus. Normal compressibility. Internal Jugular Vein: No evidence of thrombus. Normal compressibility, respiratory phasicity and response to augmentation. Subclavian Vein: No evidence of thrombus. Normal compressibility, respiratory phasicity and response to augmentation. Axillary Vein: No evidence of thrombus. Normal  compressibility, respiratory phasicity and response to augmentation. Cephalic Vein: Superficial thrombophlebitis of the right cephalic vein with occlusive thrombus present from the level of the proximal forearm to the mid upper arm. Basilic Vein: No evidence of thrombus. Normal compressibility, respiratory phasicity and response to augmentation. Brachial Veins: No evidence of thrombus. Normal compressibility, respiratory phasicity and response to augmentation. Radial Veins: No evidence of thrombus. Normal compressibility, respiratory phasicity and response to augmentation. Ulnar Veins: No evidence of thrombus. Normal compressibility, respiratory phasicity and response to augmentation. Venous Reflux:  None visualized. Other Findings:  No abnormal fluid collections. IMPRESSION: 1. Superficial thrombophlebitis of the right cephalic vein from the level of the proximal forearm to the mid upper arm. 2. No evidence of DVT within the right upper extremity. Electronically Signed   By: Marcey Moan M.D.   On: 07/19/2024 08:25     Malignant spindle cell neoplasm (HCC) Gastric tumor- AUG 2025- CT A/P: Large mass within the lumen of the stomach-  Large round well-circumscribed mass in the mid stomach measures 6.3 x 6.3 cm (image 63) mass is contained within lumen of the stomach.. No evidence of metastatic adenopathy in the abdomen pelvis. No liver metastasis. No peritoneal metastasis. CT chest:  Solitary pulmonary nodule in the RIGHT lower lobe is new from 2014. Indeterminate finding. FDG PET scan may aid in determining malignant potential. 2. No mediastinal adenopathy.  PET scan:  6.6 cm gastric mass has diffuse low-grade metabolic activity, 1.0 cm left lower lobe pulmonary nodule has a maximum SUV of 2.5, borderline for malignancy. This nodule was not present on 08/09/2013.   # AUG 2025EGD biops-y negative/inconclusive.  SEP 2025- S/p endoscopic ultrasound with biopsy [Dr.Mansouraty, [place]]- positive for spindle cell  neoplasm.  EUS-suggestive of subepithelial/from the muscularis.  however, given the a cellularity-unable to perform IHC stains-differential diagnosis includes GIST tumor/leiomyoma vs others.  Ordered-liquid biopsy.   # Discussed options with the patient and family which includes# repeat EUS with biopsy [less desirable]; if liquid biopsy positive positive for GIST alterations-proceed with Gleevec-.  However least desirable-is to proceed with Gleevec-for presumed GIST.  Also messaged GI.  So for now we will await liquid biopsy to make any decisions.   # Severe anemia-second iron  deficiency/c acute on hronic GI bleed secondary to #1-today hemoglobin is 11-iron  infusion discontinued because of grade 4 anaphylactic reaction.  Continue oral iron  once a day.   # History of A-fib -  Eliquis -[Dr.Gollan]-  recommend discontinuation of Eliquis  given the ongoing severe anemia.  I recommend patient STOP eliquis .  I have reached out to cardiology for final recommendations.  Of note patient also has appointment with cardiology next week.    # DISPOSITION: # No blood- cancel tomorrow appt # follow up in 2 weeks-MD: labs- cbc/hold tube; possible 1unit  blood-  Dr.B  Above plan of care was discussed with patient/family in detail.  My contact information was given to the patient/family.     Cindy JONELLE Joe, MD 08/16/2024 5:09 PM

## 2024-08-16 NOTE — Assessment & Plan Note (Addendum)
 Gastric tumor- AUG 2025- CT A/P: Large mass within the lumen of the stomach-  Large round well-circumscribed mass in the mid stomach measures 6.3 x 6.3 cm (image 63) mass is contained within lumen of the stomach.. No evidence of metastatic adenopathy in the abdomen pelvis. No liver metastasis. No peritoneal metastasis. CT chest:  Solitary pulmonary nodule in the RIGHT lower lobe is new from 2014. Indeterminate finding. FDG PET scan may aid in determining malignant potential. 2. No mediastinal adenopathy.  PET scan:  6.6 cm gastric mass has diffuse low-grade metabolic activity, 1.0 cm left lower lobe pulmonary nodule has a maximum SUV of 2.5, borderline for malignancy. This nodule was not present on 08/09/2013.   # AUG 2025EGD biops-y negative/inconclusive.  SEP 2025- S/p endoscopic ultrasound with biopsy [Dr.Mansouraty, [place]]- positive for spindle cell neoplasm.  EUS-suggestive of subepithelial/from the muscularis.  however, given the a cellularity-unable to perform IHC stains-differential diagnosis includes GIST tumor/leiomyoma vs others.  Ordered-liquid biopsy.   # Discussed options with the patient and family which includes# repeat EUS with biopsy [less desirable]; if liquid biopsy positive positive for GIST alterations-proceed with Gleevec-.  However least desirable-is to proceed with Gleevec-for presumed GIST.  Also messaged GI.  So for now we will await liquid biopsy to make any decisions.   # Severe anemia-second iron  deficiency/c acute on hronic GI bleed secondary to #1-today hemoglobin is 11-iron  infusion discontinued because of grade 4 anaphylactic reaction.  Continue oral iron  once a day.   # History of A-fib -  Eliquis -[Dr.Gollan]-  recommend discontinuation of Eliquis  given the ongoing severe anemia.  I recommend patient STOP eliquis .  I have reached out to cardiology for final recommendations.  Of note patient also has appointment with cardiology next week.    # DISPOSITION: # No blood- cancel  tomorrow appt # follow up in 2 weeks-MD: labs- cbc/hold tube; possible 1unit  blood-  Dr.B

## 2024-08-16 NOTE — Progress Notes (Signed)
 PET 08/10/24, BX 08/09/24.   Currently has rt eye infection, being treated.

## 2024-08-16 NOTE — Progress Notes (Signed)
 [facility] liquid biopsy collected and sent today at the request of Dr. Rennie.

## 2024-08-17 ENCOUNTER — Telehealth: Payer: Self-pay

## 2024-08-17 ENCOUNTER — Other Ambulatory Visit: Payer: Self-pay

## 2024-08-17 ENCOUNTER — Inpatient Hospital Stay

## 2024-08-17 DIAGNOSIS — D490 Neoplasm of unspecified behavior of digestive system: Secondary | ICD-10-CM

## 2024-08-17 NOTE — Telephone Encounter (Signed)
 EUS scheduled, pt instructed and medications reviewed.  Patient instructions mailed to home.  Patient to call with any questions or concerns.

## 2024-08-17 NOTE — Telephone Encounter (Signed)
-----   Message from [facility] sent at 08/17/2024  6:03 AM EDT ----- Regarding: RE: [facility] patient Got it. Let me get her back on the list, will be October 20 right now with my list, but if I have someone falloff before then, then I'll get her back in if she desires, but we will wait for at least the next 2-weeks to see what the liquid biopsy returns as. I'll forward to my team now.   Kanika Bungert, Please reach out to the patient and offer her 10/20 for repeat Upper EUS for repeat sampling of the gastric spindle cell tumor, to try and get more tissue for further characterization. Let her know that we will see if her liquid biopsy being run by Oncology gives them the information and if so then we can cancel repeat EUS. Otherwise, for now, let's get her on that date for larger bore needle biopsy. Thanks. GM ----- Message ----- From: Rennie Cindy SAUNDERS, MD Sent: 08/16/2024   5:18 PM EDT To: Rosaline DELENA Call, RN; Rayfield JONETTA Jasmine, RN;# Subject: RE: [facility] patient                             About 2 weeks ----- Message ----- From: Wilhelmenia Aloha Raddle., MD Sent: 08/16/2024   5:17 PM EDT To: Rosaline DELENA Call, RN; Rayfield JONETTA Jasmine, RN;# Subject: RE: [facility] patient                             How long for liquid biopsy to return? GM ----- Message ----- From: Rennie Cindy SAUNDERS, MD Sent: 08/16/2024   5:13 PM EDT To: Rosaline DELENA Call, RN; Rayfield JONETTA Jasmine, RN;# Subject: [employer] patient                                 Dr.M-reviewed your endoscopic report-and the suspicion for GIST.  I have ordered liquid biopsy-to look for any genetic alterations that could point out towards GIST-which if positive could proceed with Gleevec.  The issue is if liquid biopsy is negative-ideally I would recommend repeat EUS.  However if patient declines/or if patient is felt high risk for procedure-the alternative option would be empiric Gleevec.  So just wanted to get your thoughts-   Thank  you. GB

## 2024-08-17 NOTE — Telephone Encounter (Signed)
 EUS has been set up for 09/17/24 at [facility] with GM at 12 noon

## 2024-08-20 NOTE — Progress Notes (Unsigned)
 Cardiology Office Note  Date:  08/21/2024   ID:  [name] [name], DOB 1931-12-06, MRN [id number]  PCP:  Gretta Comer POUR, NP   Chief Complaint  Patient presents with   Follow-up    Coronary artery disease of native artery of native heart with stable angina pectoris    HPI:  [name] is a 89 yo female with history of  CAD,s/p PCI 2009 tandem non-overlapping drug-eluting stents placed in the LAD  cardiac cath April 2015 with patent LAD stents, occluded apical LAD segment HLD, HTN,  non-Hodgkin's lymphoma,  DM and  aortic valve insufficiency  Statin intolerance Echo 11/24: EF 60% Who presents for f/u of her CAD, hx of PCI, long-term persistent atrial fibrillation 9/24  Last seen by myself in clinic March 05, 2024  Recent diagnosis of gastric tumor Large mass within the lumen of the stomach Measuring 6.3 cm GI and hematology concerned about Eliquis   Now off eliquis  due to high bleeding risk  Denies side effects from her atrial fibrillation  Residual vision deficits on the right, hopes to get partial vision back after antibiotics Difficult time doing her hobbies such as crocheting secondary to visual deficits  Lab work reviewed HGB 11.5 Scheduled for another bx next month  Denies significant shortness of breath or chest pain Reports trace ankle swelling  EKG personally reviewed by myself on todays visit EKG Interpretation Date/Time:  Tuesday August 21 2024 14:39:50 EDT Ventricular Rate:  89 PR Interval:    QRS Duration:  76 QT Interval:  368 QTC Calculation: 447 R Axis:   2  Text Interpretation: Atrial fibrillation Inferior infarct , age undetermined Cannot rule out Anterior infarct , age undetermined When compared with ECG of 23-Jul-2024 16:06, No significant change was found Confirmed by Perla Lye [phone]) on 08/21/2024 2:46:37 PM    Echo 01/20/16 with normal LV size and function, trivial aortic valve insufficiency.   bilateral foot pain and normal  ABI in 2016.  nuclear stress test October 2017 with no ischemia.   Echo May 2021 with LVEF=60-65%. Mild AI.  Echo May 2023 with LVEF=60-65%, mild LVH. Mild AI.    12/17/22  swelling in her right leg.  Venous dopplers with right LE DVT.   started on Eliquis .  She was seen in our office 12/24/22 and reported dark black stools. She was sent to the ED and her Hgb was down to 10 from 11 on 12/20/22. Positive hemoccult stool card. Her Eliquis  was lowered to 5 mg po BID.   [facility] on 10/09/23 following a syncopal event while standing up from the toilet.  several days of epistaxis prior to admission. Troponin negative. During her hospital stay her Hgb dropped from 9.2 to 6.7. She was started on Iv Protonix  and transfused 2 units of pRBCs. She underwent EGD and colonoscopy.  EGD revealed a normal esophagus, multiple gastric polyps which were biopsied.  Colonoscopy revealed nonbleeding internal hemorrhoids, mild diverticulosis without acute diverticulitis or diverticular bleeding.  She had a 19 mm polyp in the proximal ascending colon which was removed, resected  *Echo 10/11/23 with LVEF=60-65%. Trivial AI. Her syncope was felt to be due to her anemia and orthostasis from dehydration.   Lab work reviewed Total cholesterol 111 LDL 35 A1C 6.1 HGB 11.8    PMH:   has a past medical history of Acute bilateral thoracic back pain (08/16/2019), Acute post-hemorrhagic anemia (10/10/2023), Allergy , Anemia, Arthritis, Atrial fibrillation (HCC), CAD (coronary artery disease), Cancer (HCC), Chronic kidney disease, stage 3b (  HCC), Colon polyps, Diabetes mellitus, Diverticulosis of colon (without mention of hemorrhage), DVT (deep venous thrombosis) (HCC) (12/2022), GERD (gastroesophageal reflux disease), Glaucoma, Grade I diastolic dysfunction, Grade I diastolic dysfunction, Herpes zoster without complication (04/24/2020), Hyperlipidemia, Hypertension, Kidney stones, Left atrial dilation, Moderate aortic regurgitation,  Motion sickness, NHL (non-Hodgkin's lymphoma) (HCC) (dx'd 2003), Personal history of colonic polyps, Proctitis, Pruritic intertrigo (07/18/2014), Urticaria, Wears dentures, and Wears hearing aid in both ears.  PSH:    Past Surgical History:  Procedure Laterality Date   BIOPSY  10/15/2023   Procedure: BIOPSY;  Surgeon: Aundria, Ladell POUR, MD;  Location: [facility] ENDOSCOPY;  Service: Gastroenterology;;   cardiac cath-neg     CATARACT EXTRACTION Bilateral    COLONOSCOPY WITH PROPOFOL  N/A 10/15/2023   Procedure: COLONOSCOPY WITH PROPOFOL ;  Surgeon: Toledo, Ladell POUR, MD;  Location: [facility] ENDOSCOPY;  Service: Gastroenterology;  Laterality: N/A;   CORONARY ANGIOPLASTY WITH STENT PLACEMENT     x 2   dexa-neg     ESOPHAGOGASTRODUODENOSCOPY N/A 07/13/2024   Procedure: EGD (ESOPHAGOGASTRODUODENOSCOPY);  Surgeon: Jinny Carmine, MD;  Location: [facility] END[facility]COPY;  Service: Endoscopy;  Laterality: N/A;   ESOPHAGOGASTRODUODENOSCOPY N/A 07/15/2024   Procedure: EGD (ESOPHAGOGASTRODUODENOSCOPY);  Surgeon: Onita Elspeth Sharper, DO;  Location: [facility] ENDOSCOPY;  Service: Gastroenterology;  Laterality: N/A;   ESOPHAGOGASTRODUODENOSCOPY N/A 08/09/2024   Procedure: EGD (ESOPHAGOGASTRODUODENOSCOPY);  Surgeon: Wilhelmenia Aloha Raddle., MD;  Location: [facility] ENDOSCOPY;  Service: Gastroenterology;  Laterality: N/A;   ESOPHAGOGASTRODUODENOSCOPY (EGD) WITH PROPOFOL  N/A 10/15/2023   Procedure: ESOPHAGOGASTRODUODENOSCOPY (EGD) WITH PROPOFOL ;  Surgeon: Toledo, Ladell POUR, MD;  Location: [facility] ENDOSCOPY;  Service: Gastroenterology;  Laterality: N/A;   EUS N/A 08/09/2024   Procedure: ULTRASOUND, UPPER GI TRACT, ENDOSCOPIC;  Surgeon: Wilhelmenia Aloha Raddle., MD;  Location: [facility] ENDOSCOPY;  Service: Gastroenterology;  Laterality: N/A;   FINE NEEDLE ASPIRATION  08/09/2024   Procedure: FINE NEEDLE ASPIRATION;  Surgeon: Wilhelmenia Aloha Raddle., MD;  Location: [facility] ENDOSCOPY;  Service: Gastroenterology;;   HEMOSTASIS CLIP PLACEMENT  10/15/2023    Procedure: HEMOSTASIS CLIP PLACEMENT;  Surgeon: Aundria, Ladell POUR, MD;  Location: [facility] ENDOSCOPY;  Service: Gastroenterology;;   KNEE SURGERY  10/30/2003   left   LEFT HEART CATHETERIZATION WITH CORONARY ANGIOGRAM N/A 03/22/2014   Procedure: LEFT HEART CATHETERIZATION WITH CORONARY ANGIOGRAM;  Surgeon: Lonni JONETTA Cash, MD;  Location: [facility] CATH LAB;  Service: Cardiovascular;  Laterality: N/A;   POLYPECTOMY  10/15/2023   Procedure: POLYPECTOMY;  Surgeon: Toledo, Ladell POUR, MD;  Location: [facility] ENDOSCOPY;  Service: Gastroenterology;;   stress cardiolite      VAGINAL HYSTERECTOMY     partial, fibroids, one ovary left    Current Outpatient Medications  Medication Sig Dispense Refill   Bempedoic Acid -Ezetimibe  (NEXLIZET ) 180-10 MG TABS Take 1 tablet by mouth daily. Pt has scheduled an office visit with provider in April, 2025 90 tablet 2   Blood Glucose Monitoring Suppl DEVI 1 each by Does not apply route in the morning, at noon, and at bedtime. May substitute to any manufacturer covered by patient's insurance. 1 each 0   brimonidine  (ALPHAGAN ) 0.2 % ophthalmic solution Place 1 drop into the right eye in the morning and at bedtime.     Cholecalciferol  (VITAMIN D -3) 1000 UNITS CAPS Take 1 capsule by mouth daily.     CRANBERRY PO Take 1 tablet by mouth daily.     diclofenac  Sodium (VOLTAREN ) 1 % GEL Apply 4 g topically 4 (four) times daily. To your chest wall 350 g 1   Dulaglutide  (TRULICITY ) 0.75 MG/0.5ML SOAJ Inject 0.75 mg  into the skin once a week. for diabetes. 6 mL 1   ezetimibe  (ZETIA ) 10 MG tablet Take 1 tablet (10 mg total) by mouth daily. 90 tablet 1   ferrous sulfate  325 (65 FE) MG tablet Take 1 tablet (325 mg total) by mouth daily with breakfast. 30 tablet 2   fexofenadine  (ALLEGRA ) 180 MG tablet Take 1 tablet (180 mg total) by mouth as needed for allergies or rhinitis. 90 tablet 0   furosemide  (LASIX ) 20 MG tablet Take 1 tablet by mouth daily. May take additional tablet as needed for  swelling 180 tablet 3   gabapentin  (NEURONTIN ) 100 MG capsule Take 200 mg by mouth 2 (two) times daily.     Glucose Blood (BLOOD GLUCOSE TEST STRIPS) STRP 1 each by In Vitro route in the morning, at noon, and at bedtime. May substitute to any manufacturer covered by patient's insurance. 300 strip 5   Lancets Misc. MISC 1 each by Does not apply route in the morning, at noon, and at bedtime. May substitute to any manufacturer covered by patient's insurance. 300 each 5   metoprolol  succinate (TOPROL -XL) 100 MG 24 hr tablet TAKE 1 TABLET BY MOUTH EVERY MORNING WITH OR IMMEDIATELY FOLLOWING A MEAL 90 tablet 3   nitroGLYCERIN  (NITROSTAT ) 0.4 MG SL tablet Place 1 tablet (0.4 mg total) under the tongue every 5 (five) minutes as needed for chest pain. 25 tablet 2   ondansetron  (ZOFRAN -ODT) 4 MG disintegrating tablet Take 1 tablet (4 mg total) by mouth every 8 (eight) hours as needed for nausea or vomiting. 20 tablet 0   pantoprazole  (PROTONIX ) 40 MG tablet Take 1 tablet (40 mg total) by mouth 2 (two) times daily. TAKE 1 TABLET BY MOUTH DAILY FOR HEARTBURN 60 tablet 12   sertraline  (ZOLOFT ) 100 MG tablet TAKE 1 TABLET BY MOUTH DAILY FOR ANXIETY AND DEPRESSION 90 tablet 0   sucralfate  (CARAFATE ) 1 g tablet Take 1 tablet (1 g total) by mouth 2 (two) times daily. (Patient taking differently: Take 1 g by mouth 4 (four) times daily.) 60 tablet 2   TOBRADEX ophthalmic ointment Place 1 Application into the right eye 3 (three) times daily.     vitamin B-12 (CYANOCOBALAMIN ) 1000 MCG tablet Take 1,000 mcg by mouth daily. (Patient taking differently: Take 1,000 mcg by mouth every other day.)     apixaban  (ELIQUIS ) 5 MG TABS tablet Take 1 tablet (5 mg total) by mouth 2 (two) times daily. (Patient not taking: Reported on 08/21/2024) 180 tablet 1   No current facility-administered medications for this visit.     Allergies:   Other, Shellfish allergy , Venofer  [iron  sucrose], Cephalexin , Ciprofloxacin, Clopidogrel bisulfate,  Ezetimibe -simvastatin, Lidocaine , Nitrofurantoin, Pregabalin, Celecoxib, Codeine, Codeine phosphate, Hydrocod poli-chlorphe poli er, Propoxyphene, Propoxyphene n-acetaminophen , Rofecoxib, Sulfa antibiotics, Sulfamethoxazole, and Sulfonamide derivatives   Social History:  The patient  reports that she has never smoked. She has never used smokeless tobacco. She reports that she does not drink alcohol and does not use drugs.   Family History:   family history includes Cancer in her mother and sister; Heart attack in her father and sister; Hypertension in her father; Lung cancer in her brother.    Review of Systems: Review of Systems  Constitutional: Negative.   HENT: Negative.    Respiratory: Negative.    Cardiovascular: Negative.   Gastrointestinal: Negative.   Musculoskeletal: Negative.   Neurological: Negative.   Psychiatric/Behavioral: Negative.    All other systems reviewed and are negative.   PHYSICAL EXAM: VS:  BP 128/76 (BP Location: Left Arm, Patient Position: Sitting, Cuff Size: Normal)   Ht 5' 10 (1.778 m)   Wt 165 lb (74.8 kg)   SpO2 98%   BMI 23.68 kg/m  , BMI Body mass index is 23.68 kg/m. Constitutional:  oriented to person, place, and time. No distress.  HENT:  Head: Grossly normal Eyes:  no discharge. No scleral icterus.  Neck: No JVD, no carotid bruits  Cardiovascular: Regular rate and rhythm, no murmurs appreciated Pulmonary/Chest: Clear to auscultation bilaterally, no wheezes or rales Abdominal: Soft.  no distension.  no tenderness.  Musculoskeletal: Normal range of motion Neurological:  normal muscle tone. Coordination normal. No atrophy Skin: Skin warm and dry Psychiatric: normal affect, pleasant   Recent Labs: 10/09/2023: TSH 1.483 05/23/2024: Pro B Natriuretic peptide (BNP) 594.0 07/23/2024: ALT 12; B Natriuretic Peptide 596.1; Magnesium 1.8 07/24/2024: BUN 13; Creatinine, Ser 1.01; Potassium 4.8; Sodium 140 08/16/2024: Hemoglobin 11.5; Platelet Count  220    Lipid Panel Lab Results  Component Value Date   CHOL 111 08/24/2023   HDL 33.80 (L) 08/24/2023   LDLCALC 54 02/01/2022   TRIG (H) 08/24/2023    452.0 Triglyceride is over 400; calculations on Lipids are invalid.      Wt Readings from Last 3 Encounters:  08/21/24 165 lb (74.8 kg)  08/16/24 161 lb 14.4 oz (73.4 kg)  08/09/24 167 lb (75.8 kg)       ASSESSMENT AND PLAN:  Problem List Items Addressed This Visit       Cardiology Problems   CAD (coronary artery disease) - Primary   Relevant Orders   EKG 12-Lead (Completed)   Essential hypertension   Mixed hyperlipidemia   Nonrheumatic aortic valve insufficiency   Other Visit Diagnoses       Atrial fibrillation, unspecified type (HCC)       Relevant Orders   EKG 12-Lead (Completed)     Hyperlipidemia LDL goal <70           Coronary artery disease with stable angina Currently with no symptoms of angina. No further workup at this time. Continue current medication regimen.  Hyperlipidemia On nexlezit, cholesterol at goal  Essential hypertension Blood pressure is well controlled on today's visit. No changes made to the medications.  Long-term persistent atrial fibrillation prior primary cardiologist in [place] told her to not pursue cardioversion Asymptomatic echo with normal ejection fraction Rate relatively well-controlled Off Eliquis  given GI tumor, high risk of bleeding Sinew metoprolol  succinate 100 daily  Anemia Prior history of GI bleed,  GI tumor, followed by GI and hematology oncology Off Eliquis  Recent hemoglobin 11.5   Signed, Velinda Lunger, M.D., Ph.D. [facility] [place], [place] [phone]

## 2024-08-21 ENCOUNTER — Encounter: Payer: Self-pay | Admitting: Cardiovascular Disease

## 2024-08-21 ENCOUNTER — Ambulatory Visit: Attending: Cardiovascular Disease | Admitting: Cardiovascular Disease

## 2024-08-21 VITALS — BP 128/76 | Ht 70.0 in | Wt 165.0 lb

## 2024-08-21 DIAGNOSIS — I351 Nonrheumatic aortic (valve) insufficiency: Secondary | ICD-10-CM

## 2024-08-21 DIAGNOSIS — E782 Mixed hyperlipidemia: Secondary | ICD-10-CM | POA: Diagnosis not present

## 2024-08-21 DIAGNOSIS — E785 Hyperlipidemia, unspecified: Secondary | ICD-10-CM | POA: Diagnosis not present

## 2024-08-21 DIAGNOSIS — I25118 Atherosclerotic heart disease of native coronary artery with other forms of angina pectoris: Secondary | ICD-10-CM

## 2024-08-21 DIAGNOSIS — I4891 Unspecified atrial fibrillation: Secondary | ICD-10-CM | POA: Diagnosis not present

## 2024-08-21 DIAGNOSIS — I10 Essential (primary) hypertension: Secondary | ICD-10-CM | POA: Diagnosis not present

## 2024-08-21 NOTE — Patient Instructions (Signed)
 Medication Instructions:   Your physician recommends that you continue on your current medications as directed. Please refer to the Current Medication list given to you today.   *If you need a refill on your cardiac medications before your next appointment, please call your pharmacy*  Lab Work:  No labs ordered today   If you have labs (blood work) drawn today and your tests are completely normal, you will receive your results only by: MyChart Message (if you have MyChart) OR A paper copy in the mail If you have any lab test that is abnormal or we need to change your treatment, we will call you to review the results.  Testing/Procedures:  No test ordered today   Follow-Up: At [facility], you and your health needs are our priority.  As part of our continuing mission to provide you with exceptional heart care, our providers are all part of one team.  This team includes your primary Cardiologist (physician) and Advanced Practice Providers or APPs (Physician Assistants and Nurse Practitioners) who all work together to provide you with the care you need, when you need it.  Your next appointment:   3-4  month(s)  Provider:   You may see Timothy Gollan, MD or one of the following Advanced Practice Providers on your designated Care Team:   Lonni Meager, NP Lesley Maffucci, PA-C Bernardino Bring, PA-C Cadence [place], PA-C Tylene Lunch, NP Barnie Hila, NP    We recommend signing up for the patient portal called MyChart.  Sign up information is provided on this After Visit Summary.  MyChart is used to connect with patients for Virtual Visits (Telemedicine).  Patients are able to view lab/test results, encounter notes, upcoming appointments, etc.  Non-urgent messages can be sent to your provider as well.   To learn more about what you can do with MyChart, go to [email or web address].

## 2024-08-22 ENCOUNTER — Emergency Department

## 2024-08-22 ENCOUNTER — Emergency Department
Admission: EM | Admit: 2024-08-22 | Discharge: 2024-08-22 | Disposition: A | Discharge status: Home / Self Care | Attending: Emergency Medicine | Admitting: Emergency Medicine

## 2024-08-22 ENCOUNTER — Other Ambulatory Visit: Payer: Self-pay

## 2024-08-22 DIAGNOSIS — S0990XA Unspecified injury of head, initial encounter: Secondary | ICD-10-CM

## 2024-08-22 DIAGNOSIS — M7989 Other specified soft tissue disorders: Secondary | ICD-10-CM | POA: Diagnosis not present

## 2024-08-22 DIAGNOSIS — W19XXXA Unspecified fall, initial encounter: Secondary | ICD-10-CM | POA: Insufficient documentation

## 2024-08-22 DIAGNOSIS — E119 Type 2 diabetes mellitus without complications: Secondary | ICD-10-CM | POA: Diagnosis not present

## 2024-08-22 DIAGNOSIS — I10 Essential (primary) hypertension: Secondary | ICD-10-CM | POA: Insufficient documentation

## 2024-08-22 DIAGNOSIS — S0003XA Contusion of scalp, initial encounter: Secondary | ICD-10-CM | POA: Diagnosis not present

## 2024-08-22 DIAGNOSIS — R918 Other nonspecific abnormal finding of lung field: Secondary | ICD-10-CM | POA: Diagnosis not present

## 2024-08-22 DIAGNOSIS — S6991XA Unspecified injury of right wrist, hand and finger(s), initial encounter: Secondary | ICD-10-CM | POA: Diagnosis present

## 2024-08-22 DIAGNOSIS — Z7901 Long term (current) use of anticoagulants: Secondary | ICD-10-CM | POA: Diagnosis not present

## 2024-08-22 DIAGNOSIS — S60211A Contusion of right wrist, initial encounter: Secondary | ICD-10-CM | POA: Insufficient documentation

## 2024-08-22 DIAGNOSIS — I2510 Atherosclerotic heart disease of native coronary artery without angina pectoris: Secondary | ICD-10-CM | POA: Insufficient documentation

## 2024-08-22 DIAGNOSIS — S199XXA Unspecified injury of neck, initial encounter: Secondary | ICD-10-CM | POA: Diagnosis not present

## 2024-08-22 DIAGNOSIS — E1122 Type 2 diabetes mellitus with diabetic chronic kidney disease: Secondary | ICD-10-CM

## 2024-08-22 DIAGNOSIS — S0083XA Contusion of other part of head, initial encounter: Secondary | ICD-10-CM | POA: Diagnosis not present

## 2024-08-22 MED ORDER — ACETAMINOPHEN 325 MG PO TABS
650.0000 mg | ORAL_TABLET | Freq: Once | ORAL | Status: AC
  Administered 2024-08-22: 650 mg via ORAL
  Filled 2024-08-22: qty 2

## 2024-08-22 MED ORDER — TRAMADOL HCL 50 MG PO TABS: 50.0000 mg | ORAL_TABLET | Freq: Two times a day (BID) | ORAL | 0 refills | Status: AC | PRN

## 2024-08-22 MED ORDER — ONDANSETRON HCL 4 MG/2ML IJ SOLN
4.0000 mg | Freq: Once | INTRAMUSCULAR | Status: AC
  Administered 2024-08-22: 4 mg via INTRAVENOUS
  Filled 2024-08-22: qty 2

## 2024-08-22 MED ORDER — TRAMADOL HCL 50 MG PO TABS
50.0000 mg | ORAL_TABLET | Freq: Once | ORAL | Status: AC
  Administered 2024-08-22: 50 mg via ORAL
  Filled 2024-08-22: qty 1

## 2024-08-22 NOTE — ED Provider Notes (Signed)
 [facility] Provider Note    Event Date/Time   First MD Initiated Contact with Patient 08/22/24 [phone]     (approximate)   History   Fall   HPI  [name] [name] is a 89 y.o. female with a history of CAD, hypertension, hyperlipidemia, non-Hodgkin's lymphoma, diabetes, atrial fibrillation, and aortic insufficiency who presents with head injury after mechanical fall.  The patient states that she was reaching for her rollator and it was not locked, causing her to lose her balance and fall.  She hit the right side of her forehead.  She did not lose consciousness.  Did not feel dizzy or lightheaded.  She does report some nausea.  She also reports some swelling to the back of her right wrist but no significant pain there.  She has no significant pain in her neck or back or any pain in her hips or legs.  I reviewed the past medical records.  The patient was seen by Dr. Gollan from cardiology yesterday for follow-up of her chronic conditions.  She was taken off Eliquis  today due to high bleeding risk.   Physical Exam   Triage Vital Signs: ED Triage Vitals  Encounter Vitals Group     BP 08/22/24 1826 (!) 194/97     Girls Systolic BP Percentile --      Girls Diastolic BP Percentile --      Boys Systolic BP Percentile --      Boys Diastolic BP Percentile --      Pulse Rate 08/22/24 1826 92     Resp 08/22/24 1826 20     Temp 08/22/24 1826 98 F (36.7 C)     Temp Source 08/22/24 1826 Oral     SpO2 --      Weight 08/22/24 1827 165 lb (74.8 kg)     Height 08/22/24 1827 5' 9 (1.753 m)     Head Circumference --      Peak Flow --      Pain Score 08/22/24 1827 9     Pain Loc --      Pain Education --      Exclude from Growth Chart --     Most recent vital signs: Vitals:   08/22/24 1826 08/22/24 2000  BP: (!) 194/97 (!) 154/92  Pulse: 92 (!) 117  Resp: 20 18  Temp: 98 F (36.7 C)   SpO2:  95%     General: Awake, no distress.  CV:  Good peripheral  perfusion.  Resp:  Normal effort.  Abd:  No distention.  Other:  EOMI.SABRA Mallory.  No photophobia.  No facial droop.  Normal speech.  No midline spinal tenderness.  Motor intact in all extremities.  No ataxia.  Large right forehead hematoma.   ED Results / Procedures / Treatments   Labs (all labs ordered are listed, but only abnormal results are displayed) Labs Reviewed - No data to display   EKG  ED ECG REPORT I, Waylon Cassis, the attending physician, personally viewed and interpreted this ECG.  Date: 08/22/2024 EKG Time: 1826 Rate: 98 Rhythm: Atrial fibrillation QRS Axis: normal Intervals: normal ST/T Wave abnormalities: normal Narrative Interpretation: no evidence of acute ischemia    RADIOLOGY  CT head: I independently viewed and interpreted the images; there is no ICH.  Radiology report indicates right frontal scalp hematoma with no acute intracranial abnormality.  CT cervical spine: No acute fracture  XR R wrist: No acute fracture  PROCEDURES:  Critical Care  performed: No  Procedures   MEDICATIONS ORDERED IN ED: Medications  traMADol  (ULTRAM ) tablet 50 mg (has no administration in time range)  ondansetron  (ZOFRAN ) injection 4 mg (4 mg Intravenous Given 08/22/24 1906)  acetaminophen  (TYLENOL ) tablet 650 mg (650 mg Oral Given 08/22/24 1906)     IMPRESSION / MDM / ASSESSMENT AND PLAN / ED COURSE  I reviewed the triage vital signs and the nursing notes.  89 year old female with PMH as noted above who presents after mechanical fall from standing height with a hematoma on her right forehead.  She also has some swelling to her right wrist.  She denies any neck or back pain.  Physical exam is otherwise unremarkable for traumatic findings.  Differential diagnosis includes, but is not limited to, minor head injury, concussion, ICH.  We will obtain CT head and cervical spine, x-rays of the right wrist, and reassess.  There is no indication for lab  workup.  Patient's presentation is most consistent with acute presentation with potential threat to life or bodily function.  ----------------------------------------- 8:39 PM on 08/22/2024 -----------------------------------------  The imaging is negative.  At this time, the patient is stable for discharge home.  I counseled her on the results of the workup.  I gave strict return precautions, and she expressed understanding.   FINAL CLINICAL IMPRESSION(S) / ED DIAGNOSES   Final diagnoses:  Injury of head, initial encounter  Contusion of right wrist, initial encounter     Rx / DC Orders   ED Discharge Orders          Ordered    traMADol  (ULTRAM ) 50 MG tablet  Every 12 hours PRN        08/22/24 2038             Note:  This document was prepared using Dragon voice recognition software and may include unintentional dictation errors.    Jacolyn Pae, MD 08/22/24 2039

## 2024-08-22 NOTE — ED Triage Notes (Signed)
 Pt arrives via EMS from home for a trip and fall, -loc, pt has hematoma to right forehead, stopped Eliquis  yesterday

## 2024-08-22 NOTE — Discharge Instructions (Signed)
 Return to the ER immediately for new, worsening, or persistent severe pain, dizziness, vomiting, or any other new or worsening symptoms that concern you.

## 2024-08-23 ENCOUNTER — Encounter: Payer: Self-pay | Admitting: Primary Care

## 2024-08-23 ENCOUNTER — Ambulatory Visit: Payer: Self-pay | Admitting: Primary Care

## 2024-08-23 ENCOUNTER — Other Ambulatory Visit

## 2024-08-23 ENCOUNTER — Ambulatory Visit: Admitting: Primary Care

## 2024-08-23 VITALS — BP 134/82 | HR 94 | Temp 97.8°F | Ht 69.0 in | Wt 164.5 lb

## 2024-08-23 DIAGNOSIS — N1832 Chronic kidney disease, stage 3b: Secondary | ICD-10-CM

## 2024-08-23 DIAGNOSIS — E1122 Type 2 diabetes mellitus with diabetic chronic kidney disease: Secondary | ICD-10-CM | POA: Diagnosis not present

## 2024-08-23 LAB — POCT GLYCOSYLATED HEMOGLOBIN (HGB A1C): Hemoglobin A1C: 6.1 % — AB (ref 4.0–5.6)

## 2024-08-23 MED ORDER — TRULICITY 0.75 MG/0.5ML ~~LOC~~ SOAJ: 0.7500 mg | SUBCUTANEOUS | 1 refills | Status: DC

## 2024-08-23 NOTE — Assessment & Plan Note (Signed)
 Controlled with A1C of 6.1 today.  Continue Trulicity  0.75 mg weekly Remain off Glipizide    Foot exam today.  Follow-up in 3 months

## 2024-08-23 NOTE — Patient Instructions (Signed)
 Continue Trulicity  0.75 mg weekly for diabetes.  Please schedule a physical to meet with me in 3 months.   It was a pleasure to see you today!

## 2024-08-23 NOTE — Progress Notes (Signed)
 Subjective:    Patient ID: [name] [name], female    DOB: Jan 03, 1932, 89 y.o.   MRN: [id number]  [name] [name] is a very pleasant 89 y.o. female with a significant medical history including hypertension, CAD, type 2 diabetes, CHF, GERD, chronic diarrhea, GI bleed, renal insufficiency, syncope who presents today for follow-up of diabetes.  Current medications include: Trulicity  0.75 mg weekly. She denies diarrhea, nausea, GI symptoms.   She is checking her blood glucose 1 times daily and is getting readings of:  AM fasting: mid 100s  Last A1C: 6.1 in March 2025, 6.1 today. Last Eye Exam: Up-to-date Last Foot Exam: Due Pneumonia Vaccination: 2021 Urine Microalbumin: UTD Statin: Zetia , intolerant to statins  Dietary changes since last visit: Home cooked meals, mostly veggies and protein.    Exercise: None  BP Readings from Last 3 Encounters:  08/23/24 134/82  08/22/24 (!) 155/88  08/21/24 128/76       Review of Systems  Respiratory:  Negative for shortness of breath.   Cardiovascular:  Negative for chest pain.  Neurological:  Positive for numbness.         Past Medical History:  Diagnosis Date   Acute bilateral thoracic back pain 08/16/2019   Acute post-hemorrhagic anemia 10/10/2023   Allergy     Cipro, Plavix, Statins   Anemia    Arthritis    Atrial fibrillation (HCC)    CAD (coronary artery disease)    a. s/p tandem Promus DES to LAD in 2009 by Dr. Obie;  b.  LHC (03/22/14):  prox LAD 30%, mid LAD 40%, LAD stent ok with dist 20% ISR, apical LAD occluded with L-L collats filling apical vessel (too small for PCI), mid RCA 20%, EF 70%.  Med Rx   Cancer [facility])    Chronic kidney disease, stage 3b (HCC)    Colon polyps    Diabetes mellitus    Diagnosed 2012   Diverticulosis of colon (without mention of hemorrhage)    DVT (deep venous thrombosis) (HCC) 12/2022   right leg   GERD (gastroesophageal reflux disease)    Glaucoma    Grade I diastolic dysfunction     Grade I diastolic dysfunction    Herpes zoster without complication 04/24/2020   Hyperlipidemia    intol of statins   Hypertension    Kidney stones    Left atrial dilation    Moderate aortic regurgitation    Motion sickness    back seat cars   NHL (non-Hodgkin's lymphoma) (HCC) dx'd 2003   chemo/xrt comp 2003   Personal history of colonic polyps    Proctitis    Pruritic intertrigo 07/18/2014   Urticaria    Wears dentures    Full upper, partial lower   Wears hearing aid in both ears     Social History   Socioeconomic History   Marital status: Widowed    Spouse name: Not on file   Number of children: 0   Years of education: Not on file   Highest education level: 12th grade  Occupational History   Occupation: Retired    Associate Professor: RETIRED   Occupation: retired  Tobacco Use   Smoking status: Never   Smokeless tobacco: Never  Vaping Use   Vaping status: Never Used  Substance and Sexual Activity   Alcohol use: No   Drug use: No   Sexual activity: Not Currently  Other Topics Concern   Not on file  Social History Narrative   Not on file  Social Drivers of Corporate investment banker Strain: Low Risk  (05/20/2024)   Overall Financial Resource Strain (CARDIA)    Difficulty of Paying Living Expenses: Not hard at all  Food Insecurity: No Food Insecurity (07/23/2024)   Hunger Vital Sign    Worried About Running Out of Food in the Last Year: Never true    Ran Out of Food in the Last Year: Never true  Transportation Needs: No Transportation Needs (07/23/2024)   PRAPARE - Administrator, Civil Service (Medical): No    Lack of Transportation (Non-Medical): No  Physical Activity: Inactive (05/20/2024)   Exercise Vital Sign    Days of Exercise per Week: 0 days    Minutes of Exercise per Session: Not on file  Stress: No Stress Concern Present (05/20/2024)   [employer] of Occupational Health - Occupational Stress Questionnaire    Feeling of Stress: Not at all   Social Connections: Unknown (07/14/2024)   Social Connection and Isolation Panel    Frequency of Communication with Friends and Family: More than three times a week    Frequency of Social Gatherings with Friends and Family: More than three times a week    Attends Religious Services: Not on file    Active Member of Clubs or Organizations: Yes    Attends Banker Meetings: Not on file    Marital Status: Widowed  Intimate Partner Violence: Not At Risk (07/23/2024)   Humiliation, Afraid, Rape, and Kick questionnaire    Fear of Current or Ex-Partner: No    Emotionally Abused: No    Physically Abused: No    Sexually Abused: No    Past Surgical History:  Procedure Laterality Date   BIOPSY  10/15/2023   Procedure: BIOPSY;  Surgeon: Aundria, Ladell POUR, MD;  Location: [facility] ENDOSCOPY;  Service: Gastroenterology;;   cardiac cath-neg     CATARACT EXTRACTION Bilateral    COLONOSCOPY WITH PROPOFOL  N/A 10/15/2023   Procedure: COLONOSCOPY WITH PROPOFOL ;  Surgeon: Toledo, Ladell POUR, MD;  Location: [facility] ENDOSCOPY;  Service: Gastroenterology;  Laterality: N/A;   CORONARY ANGIOPLASTY WITH STENT PLACEMENT     x 2   dexa-neg     ESOPHAGOGASTRODUODENOSCOPY N/A 07/13/2024   Procedure: EGD (ESOPHAGOGASTRODUODENOSCOPY);  Surgeon: Jinny Carmine, MD;  Location: [facility] ENDOSCOPY;  Service: Endoscopy;  Laterality: N/A;   ESOPHAGOGASTRODUODENOSCOPY N/A 07/15/2024   Procedure: EGD (ESOPHAGOGASTRODUODENOSCOPY);  Surgeon: Onita Elspeth Sharper, DO;  Location: [facility] ENDOSCOPY;  Service: Gastroenterology;  Laterality: N/A;   ESOPHAGOGASTRODUODENOSCOPY N/A 08/09/2024   Procedure: EGD (ESOPHAGOGASTRODUODENOSCOPY);  Surgeon: Wilhelmenia Aloha Raddle., MD;  Location: [facility] ENDOSCOPY;  Service: Gastroenterology;  Laterality: N/A;   ESOPHAGOGASTRODUODENOSCOPY (EGD) WITH PROPOFOL  N/A 10/15/2023   Procedure: ESOPHAGOGASTRODUODENOSCOPY (EGD) WITH PROPOFOL ;  Surgeon: Toledo, Ladell POUR, MD;  Location: [facility] ENDOSCOPY;  Service:  Gastroenterology;  Laterality: N/A;   EUS N/A 08/09/2024   Procedure: ULTRASOUND, UPPER GI TRACT, ENDOSCOPIC;  Surgeon: Wilhelmenia Aloha Raddle., MD;  Location: [facility] ENDOSCOPY;  Service: Gastroenterology;  Laterality: N/A;   FINE NEEDLE ASPIRATION  08/09/2024   Procedure: FINE NEEDLE ASPIRATION;  Surgeon: Wilhelmenia Aloha Raddle., MD;  Location: [facility] ENDOSCOPY;  Service: Gastroenterology;;   HEMOSTASIS CLIP PLACEMENT  10/15/2023   Procedure: HEMOSTASIS CLIP PLACEMENT;  Surgeon: Aundria, Ladell POUR, MD;  Location: [facility] ENDOSCOPY;  Service: Gastroenterology;;   KNEE SURGERY  10/30/2003   left   LEFT HEART CATHETERIZATION WITH CORONARY ANGIOGRAM N/A 03/22/2014   Procedure: LEFT HEART CATHETERIZATION WITH CORONARY ANGIOGRAM;  Surgeon: Lonni JONETTA Cash, MD;  Location:  [facility] CATH LAB;  Service: Cardiovascular;  Laterality: N/A;   POLYPECTOMY  10/15/2023   Procedure: POLYPECTOMY;  Surgeon: Toledo, Ladell POUR, MD;  Location: [facility] ENDOSCOPY;  Service: Gastroenterology;;   stress cardiolite      VAGINAL HYSTERECTOMY     partial, fibroids, one ovary left    Family History  Problem Relation Age of Onset   Cancer Mother        jaw   Hypertension Father    Heart attack Father    Heart attack Sister    Cancer Sister        unknown primary-mets throughout body   Lung cancer Brother    Colon cancer Neg Hx    Esophageal cancer Neg Hx    Pancreatic cancer Neg Hx    Stomach cancer Neg Hx    Liver disease Neg Hx     Allergies  Allergen Reactions   Other Rash and Anaphylaxis   Shellfish Allergy  Anaphylaxis and Rash   Venofer  [Iron  Sucrose] Anaphylaxis    Severe anaphylactic reaction to Venofer -needing chest compressions; EpiPen .    Cephalexin      REACTION: trash and swelling   Ciprofloxacin     REACTION: u/k   Clopidogrel Bisulfate     REACTION: swelling, rash   Ezetimibe -Simvastatin     REACTION: myalgias   Lidocaine  Hives    ONLY TO ADHESIVE LIDOCAINE  PATCHES. NO PROBLEM WITH INJECTABLE  LIDOCAINE .   Nitrofurantoin     REACTION: panama   Pregabalin     REACTION: felt bad   Celecoxib Rash    REACTION: uk   Codeine Rash   Codeine Phosphate Rash   Hydrocod Poli-Chlorphe Poli Er Rash   Propoxyphene Rash   Propoxyphene N-Acetaminophen  Rash   Rofecoxib Rash   Sulfa Antibiotics Rash   Sulfamethoxazole Rash   Sulfonamide Derivatives Rash    Current Outpatient Medications on File Prior to Visit  Medication Sig Dispense Refill   Bempedoic Acid -Ezetimibe  (NEXLIZET ) 180-10 MG TABS Take 1 tablet by mouth daily. Pt has scheduled an office visit with provider in April, 2025 90 tablet 2   Blood Glucose Monitoring Suppl DEVI 1 each by Does not apply route in the morning, at noon, and at bedtime. May substitute to any manufacturer covered by patient's insurance. 1 each 0   brimonidine  (ALPHAGAN ) 0.2 % ophthalmic solution Place 1 drop into the right eye in the morning and at bedtime.     Cholecalciferol  (VITAMIN D -3) 1000 UNITS CAPS Take 1 capsule by mouth daily.     CRANBERRY PO Take 1 tablet by mouth daily.     diclofenac  Sodium (VOLTAREN ) 1 % GEL Apply 4 g topically 4 (four) times daily. To your chest wall 350 g 1   Dulaglutide  (TRULICITY ) 0.75 MG/0.5ML SOAJ Inject 0.75 mg into the skin once a week. for diabetes. 6 mL 1   ezetimibe  (ZETIA ) 10 MG tablet Take 1 tablet (10 mg total) by mouth daily. 90 tablet 1   ferrous sulfate  325 (65 FE) MG tablet Take 1 tablet (325 mg total) by mouth daily with breakfast. 30 tablet 2   fexofenadine  (ALLEGRA ) 180 MG tablet Take 1 tablet (180 mg total) by mouth as needed for allergies or rhinitis. 90 tablet 0   furosemide  (LASIX ) 20 MG tablet Take 1 tablet by mouth daily. May take additional tablet as needed for swelling 180 tablet 3   gabapentin  (NEURONTIN ) 100 MG capsule Take 200 mg by mouth 2 (two) times daily.     Glucose Blood (BLOOD GLUCOSE  TEST STRIPS) STRP 1 each by In Vitro route in the morning, at noon, and at bedtime. May substitute to any  manufacturer covered by patient's insurance. 300 strip 5   Lancets Misc. MISC 1 each by Does not apply route in the morning, at noon, and at bedtime. May substitute to any manufacturer covered by patient's insurance. 300 each 5   metoprolol  succinate (TOPROL -XL) 100 MG 24 hr tablet TAKE 1 TABLET BY MOUTH EVERY MORNING WITH OR IMMEDIATELY FOLLOWING A MEAL 90 tablet 3   nitroGLYCERIN  (NITROSTAT ) 0.4 MG SL tablet Place 1 tablet (0.4 mg total) under the tongue every 5 (five) minutes as needed for chest pain. 25 tablet 2   ondansetron  (ZOFRAN -ODT) 4 MG disintegrating tablet Take 1 tablet (4 mg total) by mouth every 8 (eight) hours as needed for nausea or vomiting. 20 tablet 0   pantoprazole  (PROTONIX ) 40 MG tablet Take 1 tablet (40 mg total) by mouth 2 (two) times daily. TAKE 1 TABLET BY MOUTH DAILY FOR HEARTBURN 60 tablet 12   sertraline  (ZOLOFT ) 100 MG tablet TAKE 1 TABLET BY MOUTH DAILY FOR ANXIETY AND DEPRESSION 90 tablet 0   sucralfate  (CARAFATE ) 1 g tablet Take 1 tablet (1 g total) by mouth 2 (two) times daily. (Patient taking differently: Take 1 g by mouth 4 (four) times daily.) 60 tablet 2   TOBRADEX ophthalmic ointment Place 1 Application into the right eye 3 (three) times daily.     traMADol  (ULTRAM ) 50 MG tablet Take 1 tablet (50 mg total) by mouth every 12 (twelve) hours as needed for up to 5 days for severe pain (pain score 7-10). 10 tablet 0   vitamin B-12 (CYANOCOBALAMIN ) 1000 MCG tablet Take 1,000 mcg by mouth daily. (Patient taking differently: Take 1,000 mcg by mouth every other day.)     [DISCONTINUED] loratadine  (CLARITIN ) 10 MG tablet Take 10 mg by mouth as needed.      No current facility-administered medications on file prior to visit.    BP 134/82   Pulse 94   Temp 97.8 F (36.6 C) (Oral)   Ht 5' 9 (1.753 m)   Wt 164 lb 8 oz (74.6 kg)   SpO2 95%   BMI 24.29 kg/m  Objective:   Physical Exam Cardiovascular:     Rate and Rhythm: Normal rate. Rhythm irregular.  Pulmonary:      Effort: Pulmonary effort is normal.     Breath sounds: Normal breath sounds.  Musculoskeletal:     Cervical back: Neck supple.  Skin:    General: Skin is warm and dry.     Findings: Bruising present.     Comments: Moderate bruising around right eye and forehead.    Neurological:     Mental Status: She is alert and oriented to person, place, and time.  Psychiatric:        Mood and Affect: Mood normal.     Physical Exam        Assessment & Plan:  Type 2 diabetes mellitus with stage 3b chronic kidney disease, without long-term current use of insulin  (HCC) Assessment & Plan: Controlled with A1C of 6.1 today.  Continue Trulicity  0.75 mg weekly Remain off Glipizide    Foot exam today.  Follow-up in 3 months  Orders: -     POCT glycosylated hemoglobin (Hb A1C)    Assessment and Plan Assessment & Plan      Comer MARLA Gaskins, NP      History of Present Illness

## 2024-08-24 ENCOUNTER — Telehealth: Payer: Self-pay | Admitting: Internal Medicine

## 2024-08-24 ENCOUNTER — Encounter: Payer: Self-pay | Admitting: Internal Medicine

## 2024-08-24 ENCOUNTER — Ambulatory Visit: Admitting: Primary Care

## 2024-08-24 NOTE — Telephone Encounter (Signed)
 I tried to reach the patient niece Burnard to discuss the next options-unavailable left a voicemail.  Options include repeat endoscopic ultrasound vs-empiric Gleevec.  Kristie-I appreciate if you can reach out to Beebe again next week-reiterate my above recommendations.  Thanks GB

## 2024-08-27 ENCOUNTER — Telehealth: Payer: Self-pay | Admitting: Gastroenterology

## 2024-08-27 ENCOUNTER — Telehealth: Payer: Self-pay | Admitting: Internal Medicine

## 2024-08-27 NOTE — Telephone Encounter (Signed)
 Team, I had a cancellation for this Thursday. I tried calling the patient as well as the patient's niece but had to leave voicemails. She is currently scheduled for October 20 but if we can get her in this week I think that would be ideal. [name] please try to reach out to the patient tomorrow if you do not hear from the oncology team at [facility]. I hope this works. Patient will need to hold her Eliquis  for at least 1 day prior to repeat sampling. Thanks. GM  GM Patient's niece brings her to her procedures in case you cannot reach the patient either [name] [name]

## 2024-08-27 NOTE — Telephone Encounter (Signed)
 Received a call from patients niece stating they just got off the phone with Dr. Wilhelmenia who advised them to get in contact with patty in regards to scheduling. Please review and advise  Thank you

## 2024-08-27 NOTE — Telephone Encounter (Signed)
 I left another voicemail-unable to reach the niece.  Regarding recommendation for repeating endoscopic ultrasound as liquid biopsy was negative.  Kristie/Michelle please- follow up

## 2024-08-27 NOTE — Telephone Encounter (Signed)
 Team, I had a cancellation for this Thursday. I tried calling the patient as well as the patient's niece but had to leave voicemails. She is currently scheduled for October 20 but if we can get her in this week I think that would be ideal. Averleigh Savary please try to reach out to the patient tomorrow if you do not hear from the oncology team at [facility]. I hope this works. Patient will need to hold her Eliquis  for at least 1 day prior to repeat sampling. Thanks. GM  GM Patient's niece brings her to her procedures in case you cannot reach the patient either Odetta    The pt niece has agreed to the appt and has been moved to 10/2 at 130 pm to arrive at noon.  She will hold Eliquis  as directed

## 2024-08-28 DIAGNOSIS — H16001 Unspecified corneal ulcer, right eye: Secondary | ICD-10-CM | POA: Diagnosis not present

## 2024-08-28 DIAGNOSIS — Z961 Presence of intraocular lens: Secondary | ICD-10-CM | POA: Diagnosis not present

## 2024-08-28 DIAGNOSIS — H401133 Primary open-angle glaucoma, bilateral, severe stage: Secondary | ICD-10-CM | POA: Diagnosis not present

## 2024-08-30 ENCOUNTER — Ambulatory Visit (HOSPITAL_COMMUNITY): Admitting: Anesthesiology

## 2024-08-30 ENCOUNTER — Inpatient Hospital Stay: Attending: Internal Medicine

## 2024-08-30 ENCOUNTER — Encounter (HOSPITAL_COMMUNITY): Payer: Self-pay | Admitting: Gastroenterology

## 2024-08-30 ENCOUNTER — Encounter (HOSPITAL_COMMUNITY)
Admission: RE | Disposition: A | Discharge status: Home / Self Care | Payer: Self-pay | Source: Home / Self Care | Attending: Gastroenterology

## 2024-08-30 ENCOUNTER — Encounter: Payer: Self-pay | Admitting: Internal Medicine

## 2024-08-30 ENCOUNTER — Inpatient Hospital Stay (HOSPITAL_BASED_OUTPATIENT_CLINIC_OR_DEPARTMENT_OTHER): Admitting: Internal Medicine

## 2024-08-30 ENCOUNTER — Ambulatory Visit (HOSPITAL_COMMUNITY)
Admission: RE | Admit: 2024-08-30 | Discharge: 2024-08-30 | Disposition: A | Discharge status: Home / Self Care | Attending: Gastroenterology | Admitting: Gastroenterology

## 2024-08-30 ENCOUNTER — Other Ambulatory Visit: Payer: Self-pay

## 2024-08-30 VITALS — BP 137/80 | HR 91 | Temp 98.6°F | Resp 17 | Ht 69.0 in | Wt 162.7 lb

## 2024-08-30 DIAGNOSIS — Z801 Family history of malignant neoplasm of trachea, bronchus and lung: Secondary | ICD-10-CM | POA: Diagnosis not present

## 2024-08-30 DIAGNOSIS — D490 Neoplasm of unspecified behavior of digestive system: Secondary | ICD-10-CM

## 2024-08-30 DIAGNOSIS — I10 Essential (primary) hypertension: Secondary | ICD-10-CM | POA: Diagnosis not present

## 2024-08-30 DIAGNOSIS — K449 Diaphragmatic hernia without obstruction or gangrene: Secondary | ICD-10-CM | POA: Insufficient documentation

## 2024-08-30 DIAGNOSIS — E119 Type 2 diabetes mellitus without complications: Secondary | ICD-10-CM | POA: Diagnosis not present

## 2024-08-30 DIAGNOSIS — Z8572 Personal history of non-Hodgkin lymphomas: Secondary | ICD-10-CM | POA: Insufficient documentation

## 2024-08-30 DIAGNOSIS — I110 Hypertensive heart disease with heart failure: Secondary | ICD-10-CM | POA: Diagnosis not present

## 2024-08-30 DIAGNOSIS — I5032 Chronic diastolic (congestive) heart failure: Secondary | ICD-10-CM | POA: Diagnosis not present

## 2024-08-30 DIAGNOSIS — C49A2 Gastrointestinal stromal tumor of stomach: Secondary | ICD-10-CM

## 2024-08-30 DIAGNOSIS — R911 Solitary pulmonary nodule: Secondary | ICD-10-CM | POA: Diagnosis not present

## 2024-08-30 DIAGNOSIS — I4891 Unspecified atrial fibrillation: Secondary | ICD-10-CM | POA: Diagnosis not present

## 2024-08-30 DIAGNOSIS — Z7901 Long term (current) use of anticoagulants: Secondary | ICD-10-CM | POA: Insufficient documentation

## 2024-08-30 DIAGNOSIS — C801 Malignant (primary) neoplasm, unspecified: Secondary | ICD-10-CM

## 2024-08-30 DIAGNOSIS — C162 Malignant neoplasm of body of stomach: Secondary | ICD-10-CM | POA: Insufficient documentation

## 2024-08-30 DIAGNOSIS — I2510 Atherosclerotic heart disease of native coronary artery without angina pectoris: Secondary | ICD-10-CM

## 2024-08-30 DIAGNOSIS — M1990 Unspecified osteoarthritis, unspecified site: Secondary | ICD-10-CM | POA: Diagnosis not present

## 2024-08-30 DIAGNOSIS — Z808 Family history of malignant neoplasm of other organs or systems: Secondary | ICD-10-CM | POA: Diagnosis not present

## 2024-08-30 DIAGNOSIS — Z86718 Personal history of other venous thrombosis and embolism: Secondary | ICD-10-CM | POA: Diagnosis not present

## 2024-08-30 DIAGNOSIS — Z955 Presence of coronary angioplasty implant and graft: Secondary | ICD-10-CM | POA: Insufficient documentation

## 2024-08-30 DIAGNOSIS — K219 Gastro-esophageal reflux disease without esophagitis: Secondary | ICD-10-CM | POA: Diagnosis not present

## 2024-08-30 DIAGNOSIS — C168 Malignant neoplasm of overlapping sites of stomach: Secondary | ICD-10-CM

## 2024-08-30 DIAGNOSIS — K3189 Other diseases of stomach and duodenum: Secondary | ICD-10-CM

## 2024-08-30 DIAGNOSIS — D500 Iron deficiency anemia secondary to blood loss (chronic): Secondary | ICD-10-CM | POA: Insufficient documentation

## 2024-08-30 DIAGNOSIS — K2289 Other specified disease of esophagus: Secondary | ICD-10-CM | POA: Diagnosis not present

## 2024-08-30 HISTORY — PX: FINE NEEDLE ASPIRATION: SHX6590

## 2024-08-30 HISTORY — PX: EUS: SHX5427

## 2024-08-30 HISTORY — DX: Neoplasm of unspecified behavior of digestive system: D49.0

## 2024-08-30 HISTORY — PX: ESOPHAGOGASTRODUODENOSCOPY: SHX5428

## 2024-08-30 LAB — CBC WITH DIFFERENTIAL (CANCER CENTER ONLY)
Abs Immature Granulocytes: 0.02 K/uL (ref 0.00–0.07)
Basophils Absolute: 0 K/uL (ref 0.0–0.1)
Basophils Relative: 0 %
Eosinophils Absolute: 0.1 K/uL (ref 0.0–0.5)
Eosinophils Relative: 2 %
HCT: 36.7 % (ref 36.0–46.0)
Hemoglobin: 12.1 g/dL (ref 12.0–15.0)
Immature Granulocytes: 1 %
Lymphocytes Relative: 29 %
Lymphs Abs: 1 K/uL (ref 0.7–4.0)
MCH: 32.3 pg (ref 26.0–34.0)
MCHC: 33 g/dL (ref 30.0–36.0)
MCV: 97.9 fL (ref 80.0–100.0)
Monocytes Absolute: 0.4 K/uL (ref 0.1–1.0)
Monocytes Relative: 12 %
Neutro Abs: 2 K/uL (ref 1.7–7.7)
Neutrophils Relative %: 56 %
Platelet Count: 230 K/uL (ref 150–400)
RBC: 3.75 MIL/uL — ABNORMAL LOW (ref 3.87–5.11)
RDW: 14 % (ref 11.5–15.5)
WBC Count: 3.6 K/uL — ABNORMAL LOW (ref 4.0–10.5)
nRBC: 0 % (ref 0.0–0.2)

## 2024-08-30 LAB — GLUCOSE, CAPILLARY: Glucose-Capillary: 126 mg/dL — ABNORMAL HIGH (ref 70–99)

## 2024-08-30 LAB — SAMPLE TO BLOOD BANK

## 2024-08-30 SURGERY — ULTRASOUND, UPPER GI TRACT, ENDOSCOPIC
Anesthesia: Monitor Anesthesia Care

## 2024-08-30 MED ORDER — PROPOFOL 10 MG/ML IV BOLUS
INTRAVENOUS | Status: DC | PRN
  Administered 2024-08-30: 75 ug/kg/min via INTRAVENOUS
  Administered 2024-08-30: 30 mg via INTRAVENOUS

## 2024-08-30 MED ORDER — FENTANYL CITRATE (PF) 100 MCG/2ML IJ SOLN
INTRAMUSCULAR | Status: DC | PRN
  Administered 2024-08-30: 25 ug via INTRAVENOUS

## 2024-08-30 MED ORDER — ONDANSETRON HCL 4 MG/2ML IJ SOLN
INTRAMUSCULAR | Status: DC | PRN
Start: 2024-08-30 — End: 2024-08-30
  Administered 2024-08-30: 4 mg via INTRAVENOUS

## 2024-08-30 MED ORDER — PROPOFOL 500 MG/50ML IV EMUL
INTRAVENOUS | Status: AC
  Filled 2024-08-30: qty 50

## 2024-08-30 MED ORDER — SODIUM CHLORIDE 0.9 % IV SOLN: INTRAVENOUS | Status: DC | PRN

## 2024-08-30 MED ORDER — PROPOFOL 1000 MG/100ML IV EMUL
INTRAVENOUS | Status: AC
  Filled 2024-08-30: qty 100

## 2024-08-30 MED ORDER — FENTANYL CITRATE (PF) 100 MCG/2ML IJ SOLN
INTRAMUSCULAR | Status: AC
  Filled 2024-08-30: qty 2

## 2024-08-30 MED ORDER — PHENYLEPHRINE HCL-NACL 20-0.9 MG/250ML-% IV SOLN
INTRAVENOUS | Status: DC | PRN
  Administered 2024-08-30: 50 ug/min via INTRAVENOUS

## 2024-08-30 MED ORDER — SODIUM CHLORIDE 0.9 % IV SOLN: INTRAVENOUS | Status: DC

## 2024-08-30 NOTE — Transfer of Care (Signed)
 Immediate Anesthesia Transfer of Care Note  Patient: [name] [name]  Procedure(s) Performed: ULTRASOUND, UPPER GI TRACT, ENDOSCOPIC  Patient Location: PACU and Endoscopy Unit  Anesthesia Type:MAC  Level of Consciousness: sedated and responds to stimulation  Airway & Oxygen Therapy: Patient Spontanous Breathing and Patient connected to nasal cannula oxygen  Post-op Assessment: Report given to RN and Post -op Vital signs reviewed and stable  Post vital signs: Reviewed and stable  Last Vitals:  Vitals Value Taken Time  BP 109/57 08/30/24 14:20  Temp    Pulse 75 08/30/24 14:20  Resp 14 08/30/24 14:20  SpO2 100 % 08/30/24 14:20  Vitals shown include unfiled device data.  Last Pain: There were no vitals filed for this visit.       Complications: No notable events documented.

## 2024-08-30 NOTE — Assessment & Plan Note (Addendum)
 Gastric tumor- AUG 2025- CT A/P: Large mass within the lumen of the stomach-  Large round well-circumscribed mass in the mid stomach measures 6.3 x 6.3 cm (image 63) mass is contained within lumen of the stomach.. No evidence of metastatic adenopathy in the abdomen pelvis. No liver metastasis. No peritoneal metastasis. CT chest:  Solitary pulmonary nodule in the RIGHT lower lobe is new from 2014. Indeterminate finding. FDG PET scan may aid in determining malignant potential. 2. No mediastinal adenopathy.  PET scan:  6.6 cm gastric mass has diffuse low-grade metabolic activity, 1.0 cm left lower lobe pulmonary nodule has a maximum SUV of 2.5, borderline for malignancy. This nodule was not present on 08/09/2013.   # AUG 2025EGD biops-y negative/inconclusive.  SEP 2025- S/p endoscopic ultrasound with biopsy [Dr.Mansouraty, [place]]- positive for spindle cell neoplasm.  EUS-suggestive of subepithelial/from the muscularis.  however, given the a cellularity-unable to perform IHC stains-differential diagnosis includes GIST tumor/leiomyoma vs others. Liquid biopsy-UNREMARKABLE.   # patient awaiting repeat EUS with biopsy today; discussed with GI- will plan GLEEVEC if pathology suggestive of GIST.    # Severe anemia-second iron  deficiency/c acute on hronic GI bleed secondary to #1-today hemoglobin is 12. -iron  infusion discontinued because of grade 4 anaphylactic reaction.  Continue oral iron  once a day.   # History of A-fib - OFF Eliquis -[Dr.Gollan]- s/p cardiology- consider Watchman device- defer to cardiology.     # DISPOSITION: # No blood- cancel tomorrow appt # follow up TBD- -  Dr.B- messg sent to Kristie

## 2024-08-30 NOTE — Anesthesia Procedure Notes (Signed)
 Procedure Name: MAC Date/Time: 08/30/2024 1:35 PM  Performed by: Obadiah Reyes BROCKS, CRNAPre-anesthesia Checklist: Patient identified, Emergency Drugs available, Suction available, Patient being monitored and Timeout performed Patient Re-evaluated:Patient Re-evaluated prior to induction Oxygen Delivery Method: Simple face mask Preoxygenation: Pre-oxygenation with 100% oxygen Induction Type: IV induction

## 2024-08-30 NOTE — Anesthesia Postprocedure Evaluation (Signed)
 Anesthesia Post Note  Patient: [name] [name]  Procedure(s) Performed: ULTRASOUND, UPPER GI TRACT, ENDOSCOPIC     Patient location during evaluation: PACU Anesthesia Type: MAC Level of consciousness: awake and alert Pain management: pain level controlled Vital Signs Assessment: post-procedure vital signs reviewed and stable Respiratory status: spontaneous breathing, nonlabored ventilation and respiratory function stable Cardiovascular status: blood pressure returned to baseline and stable Postop Assessment: no apparent nausea or vomiting Anesthetic complications: no   No notable events documented.  Last Vitals:  Vitals:   08/30/24 1430 08/30/24 1450  BP: 130/70 139/77  Pulse: 80 75  Resp: 16 14  Temp:    SpO2: 100% 93%    Last Pain:  Vitals:   08/30/24 1450  TempSrc:   PainSc: 0-No pain                 Almarie CHRISTELLA Marchi

## 2024-08-30 NOTE — Progress Notes (Signed)
 Patient states she has Endoscopy scheduled for today and is wondering what they are looking for.

## 2024-08-30 NOTE — Anesthesia Preprocedure Evaluation (Addendum)
 Anesthesia Evaluation  Patient identified by MRN, date of birth, ID band Patient awake    Reviewed: Allergy  & Precautions, NPO status , Patient's Chart, lab work & pertinent test results  Airway Mallampati: III  TM Distance: >3 FB Neck ROM: Full    Dental  (+) Upper Dentures, Partial Lower   Pulmonary neg pulmonary ROS Snores at night   Pulmonary exam normal breath sounds clear to auscultation       Cardiovascular hypertension (163/86 preop), Pt. on medications and Pt. on home beta blockers + CAD, + Cardiac Stents (2009 DES LAD) and + DVT  Normal cardiovascular exam+ dysrhythmias (eliquis ) Atrial Fibrillation + Valvular Problems/Murmurs (mild AI) AI  Rhythm:Regular Rate:Normal  Echo 2023  1. Left ventricular ejection fraction, by estimation, is 60 to 65%. The  left ventricle has normal function. The left ventricle has no regional  wall motion abnormalities. There is mild concentric left ventricular  hypertrophy of the with severe basal  septal thickness. Left ventricular diastolic parameters are consistent  with Grade I diastolic dysfunction (impaired relaxation).   2. Right ventricular systolic function is normal. The right ventricular  size is normal.   3. Left atrial size was moderately dilated.   4. The pericardial effusion is posterior to the left ventricle. There is  no evidence of cardiac tamponade.   5. The mitral valve is normal in structure. No evidence of mitral valve  regurgitation. No evidence of mitral stenosis.   6. The aortic valve is tricuspid. Aortic valve regurgitation is mild.  Aortic valve sclerosis is present, with no evidence of aortic valve  stenosis.   7. The inferior vena cava is normal in size with greater than 50%  respiratory variability, suggesting right atrial pressure of 3 mmHg.   Stress test 2017  Nuclear stress EF: 76%.  The left ventricular ejection fraction is hyperdynamic  (>65%).  There was no ST segment deviation noted during stress.  The study is normal.  This is a low risk study.     Neuro/Psych  PSYCHIATRIC DISORDERS Anxiety Depression    negative neurological ROS     GI/Hepatic Neg liver ROS, PUD,GERD  Controlled and Medicated,,Gastric tumor   Endo/Other  diabetes, Well Controlled, Type 2    Renal/GU negative Renal ROS  negative genitourinary   Musculoskeletal  (+) Arthritis , Osteoarthritis,    Abdominal   Peds  Hematology  (+) Blood dyscrasia Hc NHL 2003 now in remission    Anesthesia Other Findings Trulicity  LD: 9/22 (>7d)  Fell 1 week ago on face, extensive bruising, neg CT  Reproductive/Obstetrics negative OB ROS                              Anesthesia Physical Anesthesia Plan  ASA: 3  Anesthesia Plan: MAC   Post-op Pain Management:    Induction:   PONV Risk Score and Plan: 2 and Propofol  infusion and TIVA  Airway Management Planned: Natural Airway and Simple Face Mask  Additional Equipment: None  Intra-op Plan:   Post-operative Plan:   Informed Consent: I have reviewed the patients History and Physical, chart, labs and discussed the procedure including the risks, benefits and alternatives for the proposed anesthesia with the patient or authorized representative who has indicated his/her understanding and acceptance.       Plan Discussed with: CRNA  Anesthesia Plan Comments: (Last UGI 07/2024 under MAC, no issues)         Anesthesia Quick  Evaluation

## 2024-08-30 NOTE — H&P (Signed)
 GASTROENTEROLOGY PROCEDURE H&P NOTE   Primary Care Physician: Gretta Comer POUR, NP  HPI: [name] [name] is a 89 y.o. 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  Past Medical History:  Diagnosis Date   Acute bilateral thoracic back pain 08/16/2019   Acute post-hemorrhagic anemia 10/10/2023   Allergy     Cipro, Plavix, Statins   Anemia    Arthritis    Atrial fibrillation (HCC)    CAD (coronary artery disease)    a. s/p tandem Promus DES to LAD in 2009 by Dr. Obie;  b.  LHC (03/22/14):  prox LAD 30%, mid LAD 40%, LAD stent ok with dist 20% ISR, apical LAD occluded with L-L collats filling apical vessel (too small for PCI), mid RCA 20%, EF 70%.  Med Rx   Cancer [facility])    Chronic kidney disease, stage 3b (HCC)    Colon polyps    Diabetes mellitus    Diagnosed 2012   Diverticulosis of colon (without mention of hemorrhage)    DVT (deep venous thrombosis) (HCC) 12/2022   right leg   GERD (gastroesophageal reflux disease)    Glaucoma    Grade I diastolic dysfunction    Grade I diastolic dysfunction    Herpes zoster without complication 04/24/2020   Hyperlipidemia    intol of statins   Hypertension    Kidney stones    Left atrial dilation    Moderate aortic regurgitation    Motion sickness    back seat cars   NHL (non-Hodgkin's lymphoma) (HCC) dx'd 2003   chemo/xrt comp 2003   Personal history of colonic polyps    Proctitis    Pruritic intertrigo 07/18/2014   Urticaria    Wears dentures    Full upper, partial lower   Wears hearing aid in both ears    Past Surgical History:  Procedure Laterality Date   BIOPSY  10/15/2023   Procedure: BIOPSY;  Surgeon: Aundria, Ladell POUR, MD;  Location: [facility] ENDOSCOPY;  Service: Gastroenterology;;   cardiac cath-neg     CATARACT EXTRACTION Bilateral    COLONOSCOPY WITH PROPOFOL  N/A 10/15/2023   Procedure: COLONOSCOPY WITH PROPOFOL ;  Surgeon: Toledo, Ladell POUR, MD;  Location: [facility]  ENDOSCOPY;  Service: Gastroenterology;  Laterality: N/A;   CORONARY ANGIOPLASTY WITH STENT PLACEMENT     x 2   dexa-neg     ESOPHAGOGASTRODUODENOSCOPY N/A 07/13/2024   Procedure: EGD (ESOPHAGOGASTRODUODENOSCOPY);  Surgeon: Jinny Carmine, MD;  Location: [facility] ENDOSCOPY;  Service: Endoscopy;  Laterality: N/A;   ESOPHAGOGASTRODUODENOSCOPY N/A 07/15/2024   Procedure: EGD (ESOPHAGOGASTRODUODENOSCOPY);  Surgeon: Onita Elspeth Sharper, DO;  Location: [facility] ENDOSCOPY;  Service: Gastroenterology;  Laterality: N/A;   ESOPHAGOGASTRODUODENOSCOPY N/A 08/09/2024   Procedure: EGD (ESOPHAGOGASTRODUODENOSCOPY);  Surgeon: Wilhelmenia Aloha Raddle., MD;  Location: [facility] ENDOSCOPY;  Service: Gastroenterology;  Laterality: N/A;   ESOPHAGOGASTRODUODENOSCOPY (EGD) WITH PROPOFOL  N/A 10/15/2023   Procedure: ESOPHAGOGASTRODUODENOSCOPY (EGD) WITH PROPOFOL ;  Surgeon: Toledo, Ladell POUR, MD;  Location: [facility] ENDOSCOPY;  Service: Gastroenterology;  Laterality: N/A;   EUS N/A 08/09/2024   Procedure: ULTRASOUND, UPPER GI TRACT, ENDOSCOPIC;  Surgeon: Wilhelmenia Aloha Raddle., MD;  Location: [facility] ENDOSCOPY;  Service: Gastroenterology;  Laterality: N/A;   FINE NEEDLE ASPIRATION  08/09/2024   Procedure: FINE NEEDLE ASPIRATION;  Surgeon: Wilhelmenia Aloha Raddle., MD;  Location: [facility] ENDOSCOPY;  Service: Gastroenterology;;   HEMOSTASIS CLIP PLACEMENT  10/15/2023   Procedure: HEMOSTASIS CLIP PLACEMENT;  Surgeon: Toledo, Ladell POUR, MD;  Location: [facility] ENDOSCOPY;  Service:  Gastroenterology;;   KNEE SURGERY  10/30/2003   left   LEFT HEART CATHETERIZATION WITH CORONARY ANGIOGRAM N/A 03/22/2014   Procedure: LEFT HEART CATHETERIZATION WITH CORONARY ANGIOGRAM;  Surgeon: Lonni JONETTA Cash, MD;  Location[facility] CATH LAB;  Service: Cardiovascular;  Laterality: N/A;   POLYPECTOMY  10/15/2023   Procedure: POLYPECTOMY;  Surgeon: Toledo, Ladell POUR, MD;  Location: [facility] ENDOSCOPY;  Service: Gastroenterology;;   stress cardiolite      VAGINAL HYSTERECTOMY      partial, fibroids, one ovary left   Current Facility-Administered Medications  Medication Dose Route Frequency Provider Last Rate Last Admin   0.9 %  sodium chloride  infusion   Intravenous Continuous Mansouraty, Samanvi Cuccia Jr., MD        Current Facility-Administered Medications:    0.9 %  sodium chloride  infusion, , Intravenous, Continuous, Mansouraty, Aloha Raddle., MD Allergies  Allergen Reactions   Other Rash and Anaphylaxis   Shellfish Allergy  Anaphylaxis and Rash   Venofer  [Iron  Sucrose] Anaphylaxis    Severe anaphylactic reaction to Venofer -needing chest compressions; EpiPen .    Cephalexin      REACTION: trash and swelling   Ciprofloxacin     REACTION: u/k   Clopidogrel Bisulfate     REACTION: swelling, rash   Ezetimibe -Simvastatin     REACTION: myalgias   Lidocaine  Hives    ONLY TO ADHESIVE LIDOCAINE  PATCHES. NO PROBLEM WITH INJECTABLE LIDOCAINE .   Nitrofurantoin     REACTION: panama   Pregabalin     REACTION: felt bad   Celecoxib Rash    REACTION: uk   Codeine Rash   Codeine Phosphate Rash   Hydrocod Poli-Chlorphe Poli Er Rash   Propoxyphene Rash   Propoxyphene N-Acetaminophen  Rash   Rofecoxib Rash   Sulfa Antibiotics Rash   Sulfamethoxazole Rash   Sulfonamide Derivatives Rash   Family History  Problem Relation Age of Onset   Cancer Mother        jaw   Hypertension Father    Heart attack Father    Heart attack Sister    Cancer Sister        unknown primary-mets throughout body   Lung cancer Brother    Colon cancer Neg Hx    Esophageal cancer Neg Hx    Pancreatic cancer Neg Hx    Stomach cancer Neg Hx    Liver disease Neg Hx    Social History   Socioeconomic History   Marital status: Widowed    Spouse name: Not on file   Number of children: 0   Years of education: Not on file   Highest education level: 12th grade  Occupational History   Occupation: Retired    Associate Professor: RETIRED   Occupation: retired  Tobacco Use   Smoking status: Never   Smokeless  tobacco: Never  Vaping Use   Vaping status: Never Used  Substance and Sexual Activity   Alcohol use: No   Drug use: No   Sexual activity: Not Currently  Other Topics Concern   Not on file  Social History Narrative   Not on file   Social Drivers of Health   Financial Resource Strain: Low Risk  (05/20/2024)   Overall Financial Resource Strain (CARDIA)    Difficulty of Paying Living Expenses: Not hard at all  Food Insecurity: No Food Insecurity (07/23/2024)   Hunger Vital Sign    Worried About Running Out of Food in the Last Year: Never true    Ran Out of Food in the Last Year: Never true  Transportation Needs:  No Transportation Needs (07/23/2024)   PRAPARE - Administrator, Civil Service (Medical): No    Lack of Transportation (Non-Medical): No  Physical Activity: Inactive (05/20/2024)   Exercise Vital Sign    Days of Exercise per Week: 0 days    Minutes of Exercise per Session: Not on file  Stress: No Stress Concern Present (05/20/2024)   [employer] of Occupational Health - Occupational Stress Questionnaire    Feeling of Stress: Not at all  Social Connections: Unknown (07/14/2024)   Social Connection and Isolation Panel    Frequency of Communication with Friends and Family: More than three times a week    Frequency of Social Gatherings with Friends and Family: More than three times a week    Attends Religious Services: Not on file    Active Member of Clubs or Organizations: Yes    Attends Banker Meetings: Not on file    Marital Status: Widowed  Intimate Partner Violence: Not At Risk (07/23/2024)   Humiliation, Afraid, Rape, and Kick questionnaire    Fear of Current or Ex-Partner: No    Emotionally Abused: No    Physically Abused: No    Sexually Abused: No    Physical Exam: There were no vitals filed for this visit. There is no height or weight on file to calculate BMI. GEN: NAD EYE: Sclerae anicteric ENT: MMM CV: Non-tachycardic GI: Soft,  NT/ND NEURO:  Alert & Oriented x 3  Lab Results: Recent Labs    08/30/24 0937  WBC 3.6*  HGB 12.1  HCT 36.7  PLT 230   BMET No results for input(s): NA, K, CL, CO2, GLUCOSE, BUN, CREATININE, CALCIUM in the last 72 hours. LFT No results for input(s): PROT, ALBUMIN, AST, ALT, ALKPHOS, BILITOT, BILIDIR, IBILI in the last 72 hours. PT/INR No results for input(s): LABPROT, INR in the last 72 hours.   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  The risks of an EUS including intestinal perforation, bleeding, infection, aspiration, and medication effects were discussed as was the possibility it may not give a definitive diagnosis if a biopsy is performed.  When a biopsy of the pancreas is done as part of the EUS, there is an additional risk of pancreatitis at the rate of about 1-2%.  It was explained that procedure related pancreatitis is typically mild, although it can be severe and even life threatening, which is why we do not perform random pancreatic biopsies and only biopsy a lesion/area we feel is concerning enough to warrant the risk.   The risks and benefits of endoscopic evaluation/treatment were discussed with the patient and/or family; these include but are not limited to the risk of perforation, infection, bleeding, missed lesions, lack of diagnosis, severe illness requiring hospitalization, as well as anesthesia and sedation related illnesses.  The patient's history has been reviewed, patient examined, no change in status, and deemed stable for procedure.  The patient and/or family is agreeable to proceed.    Aloha Finner, MD [facility] Advanced [facility]oscopy Office # [phone]

## 2024-08-30 NOTE — Discharge Instructions (Signed)

## 2024-08-30 NOTE — Progress Notes (Signed)
 [facility] CONSULT NOTE  Patient Care Team: Gretta Comer POUR, NP as PCP - General (Internal Medicine) Perla Evalene PARAS, MD as PCP - Cardiology (Cardiology) Alline Lenis, MD (Inactive) as Consulting Physician (Urology) Portia Fireman, OD as Consulting Physician (Optometry) Homsher, Wanda, RN as [facility] Rennie Cindy SAUNDERS, MD as Consulting Physician (Oncology) Maurie Rayfield BIRCH, RN as Oncology Nurse Navigator  CHIEF COMPLAINTS/PURPOSE OF CONSULTATION: gastric mass  Oncology History Overview Note  Gastric tumor- AUG 2025- CT A/P: Large mass within the lumen of the stomach-  Large round well-circumscribed mass in the mid stomach measures 6.3 x 6.3 cm (image 63) mass is contained within lumen of the stomach.. No evidence of metastatic adenopathy in the abdomen pelvis. No liver metastasis. No peritoneal metastasis. CT chest:  Solitary pulmonary nodule in the RIGHT lower lobe is new from 2014. Indeterminate finding. FDG PET scan may aid in determining malignant potential. 2. No mediastinal adenopathy.  PET scan:  6.6 cm gastric mass has diffuse low-grade metabolic activity, 1.0 cm left lower lobe pulmonary nodule has a maximum SUV of 2.5, borderline for malignancy. This nodule was not present on 08/09/2013.   # AUG 2025EGD biops-y negative/inconclusive.  SEP 2025- S/p endoscopic ultrasound with biopsy [Dr.Mansouraty, [place]]- positive for spindle cell neoplasm.  EUS-suggestive of subepithelial/from the muscularis.  however, given the a cellularity-unable to perform IHC stains-differential diagnosis includes GIST tumor/leiomyoma vs others.  Ordered-liquid biopsy.     # NHL/of the bowel - [[facility]]treated >25  y ago- RT.  Chemo x 6- followed by RT- No surgery.    Malignant spindle cell neoplasm (HCC)  08/16/2024 Initial Diagnosis   Malignant spindle cell neoplasm (HCC)     HISTORY OF PRESENTING ILLNESS:   Accompanied by family.  Walking with a rolling walker  [name] [name] 89 y.o.  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   Patient had a mechanical fall-hitting her head.  S/p evaluation in the emergency room.  No internal bleeding noted.  Denies any blood in stools or black-colored stools.   Review of Systems  Constitutional:  Positive for malaise/fatigue. Negative for chills, diaphoresis, fever and weight loss.  HENT:  Negative for nosebleeds and sore throat.   Eyes:  Negative for double vision.  Respiratory:  Negative for cough, hemoptysis, sputum production, shortness of breath and wheezing.   Cardiovascular:  Negative for chest pain, palpitations, orthopnea and leg swelling.  Gastrointestinal:  Negative for abdominal pain, blood in stool, constipation, diarrhea, heartburn, melena, nausea and vomiting.  Genitourinary:  Negative for dysuria, frequency and urgency.  Musculoskeletal:  Negative for back pain and joint pain.  Skin: Negative.  Negative for itching and rash.  Neurological:  Negative for dizziness, tingling, focal weakness, weakness and headaches.  Endo/Heme/Allergies:  Does not bruise/bleed easily.  Psychiatric/Behavioral:  Negative for depression. The patient is not nervous/anxious and does not have insomnia.     MEDICAL HISTORY:  Past Medical History:  Diagnosis Date   Acute bilateral thoracic back pain 08/16/2019   Acute post-hemorrhagic anemia 10/10/2023   Allergy     Cipro, Plavix, Statins   Anemia    Arthritis    Atrial fibrillation (HCC)    CAD (coronary artery disease)    a. s/p tandem Promus DES to LAD in 2009 by  Dr. Obie;  b.  LHC (03/22/14):  prox LAD 30%, mid LAD 40%, LAD stent ok with dist 20% ISR, apical LAD occluded with L-L collats filling apical vessel (too small for PCI), mid RCA 20%, EF  70%.  Med Rx   Cancer [facility])    Chronic kidney disease, stage 3b (HCC)    Colon polyps    Diabetes mellitus    Diagnosed 2012   Diverticulosis of colon (without mention of hemorrhage)    DVT (deep venous thrombosis) (HCC) 12/2022   right leg   GERD (gastroesophageal reflux disease)    Glaucoma    Grade I diastolic dysfunction    Grade I diastolic dysfunction    Herpes zoster without complication 04/24/2020   Hyperlipidemia    intol of statins   Hypertension    Kidney stones    Left atrial dilation    Moderate aortic regurgitation    Motion sickness    back seat cars   NHL (non-Hodgkin's lymphoma) (HCC) dx'd 2003   chemo/xrt comp 2003   Personal history of colonic polyps    Proctitis    Pruritic intertrigo 07/18/2014   Urticaria    Wears dentures    Full upper, partial lower   Wears hearing aid in both ears     SURGICAL HISTORY: Past Surgical History:  Procedure Laterality Date   BIOPSY  10/15/2023   Procedure: BIOPSY;  Surgeon: Aundria, Ladell POUR, MD;  Location: [facility] ENDOSCOPY;  Service: Gastroenterology;;   cardiac cath-neg     CATARACT EXTRACTION Bilateral    COLONOSCOPY WITH PROPOFOL  N/A 10/15/2023   Procedure: COLONOSCOPY WITH PROPOFOL ;  Surgeon: Toledo, Ladell POUR, MD;  Location: [facility] ENDOSCOPY;  Service: Gastroenterology;  Laterality: N/A;   CORONARY ANGIOPLASTY WITH STENT PLACEMENT     x 2   dexa-neg     ESOPHAGOGASTRODUODENOSCOPY N/A 07/13/2024   Procedure: EGD (ESOPHAGOGASTRODUODENOSCOPY);  Surgeon: Jinny Carmine, MD;  Location: [facility] ENDOSCOPY;  Service: Endoscopy;  Laterality: N/A;   ESOPHAGOGASTRODUODENOSCOPY N/A 07/15/2024   Procedure: EGD (ESOPHAGOGASTRODUODENOSCOPY);  Surgeon: Onita Elspeth Sharper, DO;  Location: [facility] ENDOSCOPY;  Service: Gastroenterology;  Laterality: N/A;   ESOPHAGOGASTRODUODENOSCOPY N/A 08/09/2024   Procedure: EGD (ESOPHAGOGASTRODUODENOSCOPY);  Surgeon: Wilhelmenia Aloha Raddle., MD;  Location: [facility] ENDOSCOPY;  Service: Gastroenterology;   Laterality: N/A;   ESOPHAGOGASTRODUODENOSCOPY (EGD) WITH PROPOFOL  N/A 10/15/2023   Procedure: ESOPHAGOGASTRODUODENOSCOPY (EGD) WITH PROPOFOL ;  Surgeon: Toledo, Ladell POUR, MD;  Location: [facility] ENDOSCOPY;  Service: Gastroenterology;  Laterality: N/A;   EUS N/A 08/09/2024   Procedure: ULTRASOUND, UPPER GI TRACT, ENDOSCOPIC;  Surgeon: Wilhelmenia Aloha Raddle., MD;  Location: [facility] ENDOSCOPY;  Service: Gastroenterology;  Laterality: N/A;   FINE NEEDLE ASPIRATION  08/09/2024   Procedure: FINE NEEDLE ASPIRATION;  Surgeon: Wilhelmenia Aloha Raddle., MD;  Location: [facility] ENDOSCOPY;  Service: Gastroenterology;;   HEMOSTASIS CLIP PLACEMENT  10/15/2023   Procedure: HEMOSTASIS CLIP PLACEMENT;  Surgeon: Aundria, Ladell POUR, MD;  Location: [facility] ENDOSCOPY;  Service: Gastroenterology;;   KNEE SURGERY  10/30/2003   left   LEFT HEART CATHETERIZATION WITH CORONARY ANGIOGRAM N/A 03/22/2014   Procedure: LEFT HEART CATHETERIZATION WITH CORONARY ANGIOGRAM;  Surgeon: Lonni JONETTA Cash, MD;  Location: [facility] CATH LAB;  Service: Cardiovascular;  Laterality: N/A;   POLYPECTOMY  10/15/2023   Procedure: POLYPECTOMY;  Surgeon: Toledo, Ladell POUR, MD;  Location: [facility] ENDOSCOPY;  Service: Gastroenterology;;   stress cardiolite      VAGINAL HYSTERECTOMY     partial, fibroids, one ovary left    SOCIAL HISTORY: Social History   Socioeconomic History  Marital status: Widowed    Spouse name: Not on file   Number of children: 0   Years of education: Not on file   Highest education level: 12th grade  Occupational History   Occupation: Retired    Associate Professor: RETIRED   Occupation: retired  Tobacco Use   Smoking status: Never   Smokeless tobacco: Never  Vaping Use   Vaping status: Never Used  Substance and Sexual Activity   Alcohol use: No   Drug use: No   Sexual activity: Not Currently  Other Topics Concern   Not on file  Social History Narrative   Not on file   Social Drivers of Health   Financial Resource Strain: Low Risk   (05/20/2024)   Overall Financial Resource Strain (CARDIA)    Difficulty of Paying Living Expenses: Not hard at all  Food Insecurity: No Food Insecurity (07/23/2024)   Hunger Vital Sign    Worried About Running Out of Food in the Last Year: Never true    Ran Out of Food in the Last Year: Never true  Transportation Needs: No Transportation Needs (07/23/2024)   PRAPARE - Administrator, Civil Service (Medical): No    Lack of Transportation (Non-Medical): No  Physical Activity: Inactive (05/20/2024)   Exercise Vital Sign    Days of Exercise per Week: 0 days    Minutes of Exercise per Session: Not on file  Stress: No Stress Concern Present (05/20/2024)   [employer] of Occupational Health - Occupational Stress Questionnaire    Feeling of Stress: Not at all  Social Connections: Unknown (07/14/2024)   Social Connection and Isolation Panel    Frequency of Communication with Friends and Family: More than three times a week    Frequency of Social Gatherings with Friends and Family: More than three times a week    Attends Religious Services: Not on file    Active Member of Clubs or Organizations: Yes    Attends Banker Meetings: Not on file    Marital Status: Widowed  Intimate Partner Violence: Not At Risk (07/23/2024)   Humiliation, Afraid, Rape, and Kick questionnaire    Fear of Current or Ex-Partner: No    Emotionally Abused: No    Physically Abused: No    Sexually Abused: No    FAMILY HISTORY: Family History  Problem Relation Age of Onset   Cancer Mother        jaw   Hypertension Father    Heart attack Father    Heart attack Sister    Cancer Sister        unknown primary-mets throughout body   Lung cancer Brother    Colon cancer Neg Hx    Esophageal cancer Neg Hx    Pancreatic cancer Neg Hx    Stomach cancer Neg Hx    Liver disease Neg Hx     ALLERGIES:  is allergic to other, shellfish allergy , venofer  [iron  sucrose], cephalexin , ciprofloxacin,  clopidogrel bisulfate, ezetimibe -simvastatin, lidocaine , nitrofurantoin, pregabalin, celecoxib, codeine, codeine phosphate, hydrocod poli-chlorphe poli er, propoxyphene, propoxyphene n-acetaminophen , rofecoxib, sulfa antibiotics, sulfamethoxazole, and sulfonamide derivatives.  MEDICATIONS:  No current facility-administered medications for this visit.   No current outpatient medications on file.   Facility-Administered Medications Ordered in Other Visits  Medication Dose Route Frequency Provider Last Rate Last Admin   0.9 %  sodium chloride  infusion   Intravenous Continuous Mansouraty, Aloha Raddle., MD        PHYSICAL EXAMINATION:   Vitals:   08/30/24  0933  BP: 137/80  Pulse: 91  Resp: 17  Temp: 98.6 F (37 C)  SpO2: 95%    Filed Weights   08/30/24 0933  Weight: 162 lb 11.2 oz (73.8 kg)   Significant bruising of the right of the face.  Physical Exam Vitals and nursing note reviewed.  HENT:     Head: Normocephalic and atraumatic.     Mouth/Throat:     Pharynx: Oropharynx is clear.  Eyes:     Extraocular Movements: Extraocular movements intact.     Pupils: Pupils are equal, round, and reactive to light.  Cardiovascular:     Rate and Rhythm: Normal rate and regular rhythm.  Pulmonary:     Comments: Decreased breath sounds bilaterally.  Abdominal:     Palpations: Abdomen is soft.  Musculoskeletal:        General: Normal range of motion.     Cervical back: Normal range of motion.  Skin:    General: Skin is warm.  Neurological:     General: No focal deficit present.     Mental Status: She is alert and oriented to person, place, and time.  Psychiatric:        Behavior: Behavior normal.        Judgment: Judgment normal.     LABORATORY DATA:  I have reviewed the data as listed Lab Results  Component Value Date   WBC 3.6 (L) 08/30/2024   HGB 12.1 08/30/2024   HCT 36.7 08/30/2024   MCV 97.9 08/30/2024   PLT 230 08/30/2024   Recent Labs    07/14/24 0539  07/15/24 0513 07/16/24 0434 07/17/24 0540 07/23/24 1345 07/23/24 1610 07/24/24 0636  NA 137 139 141   < > 137 138 140  K 4.1 3.8 4.3   < > 3.9 3.1* 4.8  CL 104 109 107   < > 104 104 108  CO2 27 23 26    < > 25 24 27   GLUCOSE 129* 144* 142*   < > 189* 160* 222*  BUN 38* 22 14   < > 10 10 13   CREATININE 1.31* 1.16* 1.28*   < > 1.14* 1.09* 1.01*  CALCIUM 8.2* 8.2* 8.4*   < > 8.6* 8.4* 8.6*  GFRNONAA 39* 45* 40*   < > 46* 48* 53*  PROT 5.0* 4.9* 5.2*  --  6.2* 6.6  --   ALBUMIN 3.0* 3.0* 3.1*  --  3.5 3.5  --   AST 25 24 23   --  20 25  --   ALT 11 11 12   --  12 12  --   ALKPHOS 21* 23* 25*  --  39 38  --   BILITOT 0.8 0.8 0.9  --  0.7 0.9  --   BILIDIR 0.1 0.2 0.2  --   --   --   --   IBILI 0.7 0.6 0.7  --   --   --   --    < > = values in this interval not displayed.    RADIOGRAPHIC STUDIES: I have personally reviewed the radiological images as listed and agreed with the findings in the report. CT Head Wo Contrast Result Date: 08/22/2024 CLINICAL DATA:  Head trauma, minor (Age >= 65y); Neck trauma (Age >= 65y). Trip and fall. Forehead hematoma. Recently on a blood thinner. EXAM: CT HEAD WITHOUT CONTRAST CT CERVICAL SPINE WITHOUT CONTRAST TECHNIQUE: Multidetector CT imaging of the head and cervical spine was performed following the standard protocol without intravenous contrast. Multiplanar  CT image reconstructions of the cervical spine were also generated. RADIATION DOSE REDUCTION: This exam was performed according to the departmental dose-optimization program which includes automated exposure control, adjustment of the mA and/or kV according to patient size and/or use of iterative reconstruction technique. COMPARISON:  Head CT 07/23/2024 and MRI 10/10/2023. Cervical spine CT 10/09/2023. FINDINGS: CT HEAD FINDINGS Brain: There is no evidence of an acute infarct, intracranial hemorrhage, mass, midline shift, or extra-axial fluid collection. Mild cerebral atrophy is within normal limits for  age. Cerebral white matter hypodensities are nonspecific but compatible with mild chronic small vessel ischemic disease. Vascular: Calcified atherosclerosis at the skull base. No hyperdense vessel. Skull: No acute fracture or suspicious lesion. Sinuses/Orbits: Partially visualized chronic bilateral maxillary sinusitis. Clear mastoid air cells. Bilateral cataract extraction. Right scleral buckle. Other: Moderately large right frontal scalp hematoma. CT CERVICAL SPINE FINDINGS Alignment: Normal. Skull base and vertebrae: No acute fracture or suspicious lesion. Soft tissues and spinal canal: No prevertebral fluid or swelling. No visible canal hematoma. Disc levels: Mild-to-moderate multilevel cervical disc degeneration and mild facet arthrosis. No evidence of high-grade spinal canal stenosis. Upper chest: Partially visualized ground-glass opacities and interlobular septal thickening in the lung apices. Other: Atherosclerotic calcification about the right greater than left carotid bifurcations. IMPRESSION: 1. No evidence of acute intracranial abnormality or cervical spine fracture. 2. Right frontal scalp hematoma. 3. Partially visualized biapical lung opacities potentially reflecting edema. Electronically Signed   By: Dasie Hamburg M.D.   On: 08/22/2024 19:48   CT Cervical Spine Wo Contrast Result Date: 08/22/2024 CLINICAL DATA:  Head trauma, minor (Age >= 65y); Neck trauma (Age >= 65y). Trip and fall. Forehead hematoma. Recently on a blood thinner. EXAM: CT HEAD WITHOUT CONTRAST CT CERVICAL SPINE WITHOUT CONTRAST TECHNIQUE: Multidetector CT imaging of the head and cervical spine was performed following the standard protocol without intravenous contrast. Multiplanar CT image reconstructions of the cervical spine were also generated. RADIATION DOSE REDUCTION: This exam was performed according to the departmental dose-optimization program which includes automated exposure control, adjustment of the mA and/or kV  according to patient size and/or use of iterative reconstruction technique. COMPARISON:  Head CT 07/23/2024 and MRI 10/10/2023. Cervical spine CT 10/09/2023. FINDINGS: CT HEAD FINDINGS Brain: There is no evidence of an acute infarct, intracranial hemorrhage, mass, midline shift, or extra-axial fluid collection. Mild cerebral atrophy is within normal limits for age. Cerebral white matter hypodensities are nonspecific but compatible with mild chronic small vessel ischemic disease. Vascular: Calcified atherosclerosis at the skull base. No hyperdense vessel. Skull: No acute fracture or suspicious lesion. Sinuses/Orbits: Partially visualized chronic bilateral maxillary sinusitis. Clear mastoid air cells. Bilateral cataract extraction. Right scleral buckle. Other: Moderately large right frontal scalp hematoma. CT CERVICAL SPINE FINDINGS Alignment: Normal. Skull base and vertebrae: No acute fracture or suspicious lesion. Soft tissues and spinal canal: No prevertebral fluid or swelling. No visible canal hematoma. Disc levels: Mild-to-moderate multilevel cervical disc degeneration and mild facet arthrosis. No evidence of high-grade spinal canal stenosis. Upper chest: Partially visualized ground-glass opacities and interlobular septal thickening in the lung apices. Other: Atherosclerotic calcification about the right greater than left carotid bifurcations. IMPRESSION: 1. No evidence of acute intracranial abnormality or cervical spine fracture. 2. Right frontal scalp hematoma. 3. Partially visualized biapical lung opacities potentially reflecting edema. Electronically Signed   By: Dasie Hamburg M.D.   On: 08/22/2024 19:48   DG Wrist Complete Right Result Date: 08/22/2024 CLINICAL DATA:  Wrist swelling after fall EXAM: RIGHT WRIST - COMPLETE 3+  VIEW COMPARISON:  None Available. FINDINGS: No acute fracture or dislocation. There is no evidence of arthropathy or other focal bone abnormality. Soft tissues are unremarkable.  IMPRESSION: No acute fracture or dislocation. Electronically Signed   By: Rogelia Myers M.D.   On: 08/22/2024 19:11   NM PET Image Initial (PI) Skull Base To Thigh (F-18 FDG) Result Date: 08/10/2024 CLINICAL DATA:  Initial treatment strategy for gastric mass. EXAM: NUCLEAR MEDICINE PET SKULL BASE TO THIGH TECHNIQUE: 9.3 mCi F-18 FDG was injected intravenously. Full-ring PET imaging was performed from the skull base to thigh after the radiotracer. CT data was obtained and used for attenuation correction and anatomic localization. Fasting blood glucose: 176 mg/dl COMPARISON:  1/84/7974 FINDINGS: Mediastinal blood pool activity: SUV max 3.0 Liver activity: SUV max NA NECK: No significant abnormal hypermetabolic activity in this region. Incidental CT findings: Chronic bilateral maxillary sinusitis. Bilateral common carotid atheromatous vascular calcification. CHEST: 1.0 cm left lower lobe pulmonary nodule on image 70 series 6 has a maximum SUV of 2.5, borderline for malignancy. This nodule was not present on 08/09/2013. Incidental CT findings: Atelectasis or scarring in the lingula, right middle lobe, and right lower lobe. Coronary, aortic arch, and branch vessel atherosclerotic vascular disease. Moderate cardiomegaly. ABDOMEN/PELVIS: Proximally 6.6 cm gastric mass on image 91 series 6 has diffuse low-grade metabolic activity with maximum SUV 3.2, just above blood pool. No splenomegaly or splenic lesion observed. Incidental CT findings: Atherosclerosis is present, including aortoiliac atherosclerotic disease. Benign peripelvic renal cysts warrant no further imaging workup. Sigmoid diverticulosis. Uterus absent. SKELETON: New fracture of the upper sternal body extending into the manubriosternal joint, maximum SUV 5.2, felt to be posttraumatic rather than malignant. Faintly accentuated metabolic activity anteriorly in the right second, fourth, and fifth ribs compatible with nondisplaced fractures. Incidental CT  findings: Lower lumbar spondylosis and degenerative disc disease with grade 1 anterolisthesis at L5-S1 attributable to bilateral chronic pars defects. IMPRESSION: 1. 6.6 cm gastric mass has diffuse low-grade metabolic activity, just above blood pool. 2. 1.0 cm left lower lobe pulmonary nodule has a maximum SUV of 2.5, borderline for malignancy. This nodule was not present on 08/09/2013. A small lung cancer is not excluded. 3. New fracture of the upper sternal body extending into the manubriosternal joint. Faintly accentuated metabolic activity anteriorly in the right second, fourth, and fifth ribs compatible with nondisplaced fractures. Reportedly the patient received chest compressions around 07/23/2024. 4. Chronic bilateral maxillary sinusitis. 5. Lower lumbar spondylosis and degenerative disc disease with grade 1 anterolisthesis at L5-S1 attributable to bilateral chronic pars defects. 6.  Aortic Atherosclerosis (ICD10-I70.0). Electronically Signed   By: Ryan Salvage M.D.   On: 08/10/2024 11:48     Malignant spindle cell neoplasm (HCC) Gastric tumor- AUG 2025- CT A/P: Large mass within the lumen of the stomach-  Large round well-circumscribed mass in the mid stomach measures 6.3 x 6.3 cm (image 63) mass is contained within lumen of the stomach.. No evidence of metastatic adenopathy in the abdomen pelvis. No liver metastasis. No peritoneal metastasis. CT chest:  Solitary pulmonary nodule in the RIGHT lower lobe is new from 2014. Indeterminate finding. FDG PET scan may aid in determining malignant potential. 2. No mediastinal adenopathy.  PET scan:  6.6 cm gastric mass has diffuse low-grade metabolic activity, 1.0 cm left lower lobe pulmonary nodule has a maximum SUV of 2.5, borderline for malignancy. This nodule was not present on 08/09/2013.   # AUG 2025EGD biops-y negative/inconclusive.  SEP 2025- S/p endoscopic ultrasound with biopsy [  Dr.Mansouraty, [place]]- positive for spindle cell neoplasm.   EUS-suggestive of subepithelial/from the muscularis.  however, given the a cellularity-unable to perform IHC stains-differential diagnosis includes GIST tumor/leiomyoma vs others. Liquid biopsy-UNREMARKABLE.   # patient awaiting repeat EUS with biopsy today; discussed with GI- will plan GLEEVEC if pathology suggestive of GIST.    # Severe anemia-second iron  deficiency/c acute on hronic GI bleed secondary to #1-today hemoglobin is 12. -iron  infusion discontinued because of grade 4 anaphylactic reaction.  Continue oral iron  once a day.   # History of A-fib - OFF Eliquis -[Dr.Gollan]- s/p cardiology- consider Watchman device- defer to cardiology.     # DISPOSITION: # No blood- cancel tomorrow appt # follow up TBD- -  Dr.B- messg sent to [facility]      Above plan of care was discussed with patient/family in detail.  My contact information was given to the patient/family.     Cindy JONELLE Joe, MD 08/30/2024 12:56 PM

## 2024-08-30 NOTE — Op Note (Signed)
 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 Estimated Blood Loss:     Estimated blood loss was minimal. Procedure:                Pre-Anesthesia Assessment:                           - Prior to the procedure, a History and Physical                            was performed, and patient medications and                            allergies were reviewed. The patient's tolerance of                            previous anesthesia was also reviewed. The risks                            and benefits of the procedure and the sedation                            options and risks were discussed with the patient.                            All questions were answered, and informed consent                            was obtained. Prior Anticoagulants: The patient has                            taken no anticoagulant or antiplatelet agents. ASA                            Grade Assessment:  III - A patient with severe                            systemic disease. After reviewing the risks and                            benefits, the patient was deemed in satisfactory                            condition to undergo the procedure.                           After obtaining informed consent, the endoscope was                             passed under direct vision. Throughout the                            procedure, the patient's blood pressure, pulse, and                            oxygen saturations were monitored continuously. The                            [id number]-H190 ([id number]) Olympus endoscope was introduced                            through the mouth, and advanced to the second part                            of duodenum. The [id number] ([id number]) Olympus                            endosonoscope was introduced through the mouth, and                            advanced to the duodenum for ultrasound examination                            from the esophagus, stomach and duodenum. The upper                            EUS was accomplished without difficulty. The                            patient tolerated the procedure. Scope In: Scope Out: Findings:      ENDOSCOPIC FINDING: :      No gross lesions were noted in the entire esophagus.      The Z-line was irregular and was found 40 cm from the incisors.      A 2 cm hiatal hernia was present.      A large, submucosal and ulcerated, partially circumferential (involving       one-third of the lumen circumference) mass with no bleeding and stigmata  of recent bleeding was found on the anterior wall of the stomach.      No gross lesions were noted in the duodenal bulb, in the first portion       of the duodenum and in the second portion of the duodenum.      ENDOSONOGRAPHIC FINDING: :      A round intramural (subepithelial) lesion was found in the body of the       stomach and in the incisura of the stomach. The lesion was hypoechoic       and shadowing. Sonographically, the lesion appeared to originate from       the muscularis propria (Layer 4). The lesion also measured 64 mm by 56       mm in diameter. The outer endosonographic borders were well defined.       Fine needle biopsy was performed. Color Doppler imaging was utilized       prior to needle puncture to  confirm a lack of significant vascular       structures within the needle path. Seven passes were made with the 19       gauge Acquire biopsy needle using a transgastric approach. A visible       core of tissue was obtained. Preliminary cytologic examination and touch       preps were performed. The cellularity of the specimen was adequate.       Final cytology results are pending.      Endosonographic imaging in the visualized portion of the liver showed no       mass.      The celiac region was visualized. Impression:               EGD Impression:                           - No gross lesions in the entire esophagus. Z-line                            irregular, 40 cm from the incisors.                           - 2 cm hiatal hernia.                           - Malignant gastric tumor on the anterior wall of                            the stomach.                           - No gross lesions in the duodenal bulb, in the                            first portion of the duodenum and in the second                            portion of the duodenum.                           EUS Impression:                           -  An intramural (subepithelial) lesion was found in                            the body of the stomach and in the incisura of the                            stomach. The lesion appeared to originate from                            within the muscularis propria (Layer 4). Cytology                            results are pending. However, the endosonographic                            appearance is consistent with a stromal cell                            (smooth muscle) neoplasm. Fine needle biopsy                            performed. Moderate Sedation:      Not Applicable - Patient had care per Anesthesia. Recommendation:           - The patient will be observed post-procedure,                            until all discharge criteria are met.                           - Discharge  patient to home.                           - Patient has a contact number available for                            emergencies. The signs and symptoms of potential                            delayed complications were discussed with the                            patient. Return to normal activities tomorrow.                            Written discharge instructions were provided to the                            patient.                           - Observe patient's clinical course.                           - Await cytology results.                           -  The findings and recommendations were discussed                            with the patient.                           - The findings and recommendations were discussed                            with the patient's family. Procedure Code(s):        --- Professional ---                           [id number], Esophagogastroduodenoscopy, flexible,                            transoral; with transendoscopic ultrasound-guided                            intramural or transmural fine needle                            aspiration/biopsy(s), (includes endoscopic                            ultrasound examination limited to the esophagus,                            stomach or duodenum, and adjacent structures) Diagnosis Code(s):        --- Professional ---                           K22.89, Other specified disease of esophagus                           K44.9, Diaphragmatic hernia without obstruction or                            gangrene                           C16.8, Malignant neoplasm of overlapping sites of                            stomach                           K31.89, Other diseases of stomach and duodenum                           C49.A2, Gastrointestinal stromal tumor of stomach CPT copyright 2022 [employer]. All rights reserved. The codes documented in this report are preliminary and upon coder review may  be  revised to meet current compliance requirements. Aloha Finner, MD 08/30/2024 2:29:34 PM Number of Addenda: 0

## 2024-08-31 ENCOUNTER — Inpatient Hospital Stay

## 2024-08-31 ENCOUNTER — Encounter (HOSPITAL_COMMUNITY): Payer: Self-pay | Admitting: Gastroenterology

## 2024-09-03 ENCOUNTER — Ambulatory Visit: Payer: Self-pay | Admitting: Gastroenterology

## 2024-09-03 DIAGNOSIS — C49A2 Gastrointestinal stromal tumor of stomach: Secondary | ICD-10-CM | POA: Insufficient documentation

## 2024-09-03 NOTE — Assessment & Plan Note (Signed)
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 No evidence of metastatic adenopathy in the abdomen pelvis. No liver metastasis. No peritoneal metastasis. CT chest:  Solitary pulmonary nodule in the RIGHT lower lobe is new from 2014. Indeterminate finding. FDG PET scan may aid in determining malignant potential. 2. No mediastinal adenopathy.  PET scan:  6.6 cm gastric mass has diffuse low-grade metabolic activity, 1.0 cm left lower lobe pulmonary nodule has a maximum SUV of 2.5, borderline for malignancy. This nodule was not present on 08/09/2013.   # AUG 2025EGD biops-y negative/inconclusive.  SEP 2025- S/p endoscopic ultrasound with biopsy [Dr.Mansouraty, [place]]- positive for spindle cell neoplasm.  EUS-suggestive of subepithelial/from the muscularis.  however, given the a cellularity-unable to perform IHC stains-differential diagnosis includes GIST tumor/leiomyoma vs others. Liquid biopsy-UNREMARKABLE.    # patient awaiting repeat EUS with biopsy today; discussed with GI- will plan GLEEVEC if pathology suggestive of GIST.    # Severe anemia-second iron  deficiency/c acute on hronic GI bleed secondary to #1-today hemoglobin is 12. -iron  infusion discontinued because of grade 4 anaphylactic reaction.  Continue oral iron  once a day.   # History of A-fib - OFF Eliquis -[Dr.Gollan]- s/p cardiology- consider Watchman device- defer to cardiology.     # DISPOSITION: # No blood- cancel tomorrow appt # follow up TBD- -

## 2024-09-03 NOTE — Progress Notes (Unsigned)
 [facility] CONSULT NOTE  Patient Care Team: Gretta Comer POUR, NP as PCP - General (Internal Medicine) Perla Evalene PARAS, MD as PCP - Cardiology (Cardiology) Alline Lenis, MD (Inactive) as Consulting Physician (Urology) Portia Fireman, OD as Consulting Physician (Optometry) Homsher, Wanda, RN as [facility] Management Rennie Cindy SAUNDERS, MD as Consulting Physician (Oncology) Maurie Rayfield BIRCH, RN as Oncology Nurse Navigator  CHIEF COMPLAINTS/PURPOSE OF CONSULTATION: GIST   Oncology History Overview Note  Gastric tumor- AUG 2025- CT A/P: Large mass within the lumen of the stomach-  Large round well-circumscribed mass in the mid stomach measures 6.3 x 6.3 cm (image 63) mass is contained within lumen of the stomach.. No evidence of metastatic adenopathy in the abdomen pelvis. No liver metastasis. No peritoneal metastasis. CT chest:  Solitary pulmonary nodule in the RIGHT lower lobe is new from 2014. Indeterminate finding. FDG PET scan may aid in determining malignant potential. 2. No mediastinal adenopathy.  PET scan:  6.6 cm gastric mass has diffuse low-grade metabolic activity, 1.0 cm left lower lobe pulmonary nodule has a maximum SUV of 2.5, borderline for malignancy. This nodule was not present on 08/09/2013.   # AUG 2025EGD biops-y negative/inconclusive.  SEP 2025- S/p endoscopic ultrasound with biopsy [Dr.Mansouraty, [place]]- positive for spindle cell neoplasm.  EUS-suggestive of subepithelial/from the muscularis.  however, given the a cellularity-unable to perform IHC stains-differential diagnosis includes GIST tumor/leiomyoma vs others.  Ordered-liquid biopsy.     # NHL/of the bowel - [[facility]]treated >25  y ago- RT.  Chemo x 6- followed by RT- No surgery.    Malignant spindle cell neoplasm (HCC)  08/16/2024 Initial Diagnosis   Malignant spindle cell neoplasm (HCC)     HISTORY OF PRESENTING ILLNESS:   Accompanied by family.  Walking with a rolling walker  [name] [name] 89 y.o.  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   Patient had a mechanical fall-hitting her head.  S/p evaluation in the emergency room.  No internal bleeding noted.  Denies any blood in stools or black-colored stools.   Review of Systems  Constitutional:  Positive for malaise/fatigue. Negative for chills, diaphoresis, fever and weight loss.  HENT:  Negative for nosebleeds and sore throat.   Eyes:  Negative for double vision.  Respiratory:  Negative for cough, hemoptysis, sputum production, shortness of breath and wheezing.   Cardiovascular:  Negative for chest pain, palpitations, orthopnea and leg swelling.  Gastrointestinal:  Negative for abdominal pain, blood in stool, constipation, diarrhea, heartburn, melena, nausea and vomiting.  Genitourinary:  Negative for dysuria, frequency and urgency.  Musculoskeletal:  Negative for back pain and joint pain.  Skin: Negative.  Negative for itching and rash.  Neurological:  Negative for dizziness, tingling, focal weakness, weakness and headaches.  Endo/Heme/Allergies:  Does not bruise/bleed easily.  Psychiatric/Behavioral:  Negative for depression. The patient is not nervous/anxious and does not have insomnia.     MEDICAL HISTORY:  Past Medical History:  Diagnosis Date   Acute bilateral thoracic back pain 08/16/2019   Acute post-hemorrhagic anemia 10/10/2023   Allergy     Cipro, Plavix, Statins   Anemia    Arthritis    Atrial fibrillation (HCC)    CAD (coronary artery disease)    a. s/p tandem Promus DES to LAD in 2009 by  Dr. Obie;  b.  LHC (03/22/14):  prox LAD 30%, mid LAD 40%, LAD stent ok with dist 20% ISR, apical LAD occluded with L-L collats filling apical vessel (too small for PCI), mid RCA 20%, EF  70%.  Med Rx   Cancer [facility])    Chronic kidney disease, stage 3b (HCC)    Colon polyps    Diabetes mellitus    Diagnosed 2012   Diverticulosis of colon (without mention of hemorrhage)    DVT (deep venous thrombosis) (HCC) 12/2022   right leg   GERD (gastroesophageal reflux disease)    Glaucoma    Grade I diastolic dysfunction    Grade I diastolic dysfunction    Herpes zoster without complication 04/24/2020   Hyperlipidemia    intol of statins   Hypertension    Kidney stones    Left atrial dilation    Moderate aortic regurgitation    Motion sickness    back seat cars   NHL (non-Hodgkin's lymphoma) (HCC) dx'd 2003   chemo/xrt comp 2003   Personal history of colonic polyps    Proctitis    Pruritic intertrigo 07/18/2014   Urticaria    Wears dentures    Full upper, partial lower   Wears hearing aid in both ears     SURGICAL HISTORY: Past Surgical History:  Procedure Laterality Date   BIOPSY  10/15/2023   Procedure: BIOPSY;  Surgeon: Aundria, Ladell POUR, MD;  Location: [facility] ENDOSCOPY;  Service: Gastroenterology;;   cardiac cath-neg     CATARACT EXTRACTION Bilateral    COLONOSCOPY WITH PROPOFOL  N/A 10/15/2023   Procedure: COLONOSCOPY WITH PROPOFOL ;  Surgeon: Toledo, Ladell POUR, MD;  Location: [facility] ENDOSCOPY;  Service: Gastroenterology;  Laterality: N/A;   CORONARY ANGIOPLASTY WITH STENT PLACEMENT     x 2   dexa-neg     ESOPHAGOGASTRODUODENOSCOPY N/A 07/13/2024   Procedure: EGD (ESOPHAGOGASTRODUODENOSCOPY);  Surgeon: Jinny Carmine, MD;  Location: [facility] ENDOSCOPY;  Service: Endoscopy;  Laterality: N/A;   ESOPHAGOGASTRODUODENOSCOPY N/A 07/15/2024   Procedure: EGD (ESOPHAGOGASTRODUODENOSCOPY);  Surgeon: Onita Elspeth Sharper, DO;  Location: [facility] ENDOSCOPY;  Service: Gastroenterology;  Laterality: N/A;   ESOPHAGOGASTRODUODENOSCOPY N/A 08/09/2024   Procedure: EGD (ESOPHAGOGASTRODUODENOSCOPY);  Surgeon: Wilhelmenia Aloha Raddle., MD;  Location: [facility] ENDOSCOPY;  Service: Gastroenterology;   Laterality: N/A;   ESOPHAGOGASTRODUODENOSCOPY N/A 08/30/2024   Procedure: EGD (ESOPHAGOGASTRODUODENOSCOPY);  Surgeon: Wilhelmenia Aloha Raddle., MD;  Location: [facility] ENDOSCOPY;  Service: Gastroenterology;  Laterality: N/A;   ESOPHAGOGASTRODUODENOSCOPY (EGD) WITH PROPOFOL  N/A 10/15/2023   Procedure: ESOPHAGOGASTRODUODENOSCOPY (EGD) WITH PROPOFOL ;  Surgeon: Toledo, Ladell POUR, MD;  Location: [facility] ENDOSCOPY;  Service: Gastroenterology;  Laterality: N/A;   EUS N/A 08/09/2024   Procedure: ULTRASOUND, UPPER GI TRACT, ENDOSCOPIC;  Surgeon: Wilhelmenia Aloha Raddle., MD;  Location: [facility] ENDOSCOPY;  Service: Gastroenterology;  Laterality: N/A;   EUS N/A 08/30/2024   Procedure: ULTRASOUND, UPPER GI TRACT, ENDOSCOPIC;  Surgeon: Wilhelmenia Aloha Raddle., MD;  Location: [facility] ENDOSCOPY;  Service: Gastroenterology;  Laterality: N/A;   FINE NEEDLE ASPIRATION  08/09/2024   Procedure: FINE NEEDLE ASPIRATION;  Surgeon: Wilhelmenia Aloha Raddle., MD;  Location: [facility];  Service: Gastroenterology;;   FINE NEEDLE ASPIRATION  08/30/2024   Procedure: FINE NEEDLE ASPIRATION;  Surgeon: Wilhelmenia Aloha Raddle., MD;  Location: [facility];  Service: Gastroenterology;;   HEMOSTASIS CLIP PLACEMENT  10/15/2023   Procedure: HEMOSTASIS CLIP PLACEMENT;  Surgeon: Aundria, Ladell POUR, MD;  Location: [facility] ENDOSCOPY;  Service: Gastroenterology;;   KNEE SURGERY  10/30/2003   left   LEFT HEART CATHETERIZATION WITH CORONARY ANGIOGRAM  N/A 03/22/2014   Procedure: LEFT HEART CATHETERIZATION WITH CORONARY ANGIOGRAM;  Surgeon: Lonni JONETTA Cash, MD;  Location: [facility] CATH LAB;  Service: Cardiovascular;  Laterality: N/A;   POLYPECTOMY  10/15/2023   Procedure: POLYPECTOMY;  Surgeon: Toledo, Ladell POUR, MD;  Location: [facility] ENDOSCOPY;  Service: Gastroenterology;;   stress cardiolite      VAGINAL HYSTERECTOMY     partial, fibroids, one ovary left    SOCIAL HISTORY: Social History   Socioeconomic History   Marital status: Widowed    Spouse name: Not on  file   Number of children: 0   Years of education: Not on file   Highest education level: 12th grade  Occupational History   Occupation: Retired    Associate Professor: RETIRED   Occupation: retired  Tobacco Use   Smoking status: Never   Smokeless tobacco: Never  Vaping Use   Vaping status: Never Used  Substance and Sexual Activity   Alcohol use: No   Drug use: No   Sexual activity: Not Currently  Other Topics Concern   Not on file  Social History Narrative   Not on file   Social Drivers of Health   Financial Resource Strain: Low Risk  (05/20/2024)   Overall Financial Resource Strain (CARDIA)    Difficulty of Paying Living Expenses: Not hard at all  Food Insecurity: No Food Insecurity (07/23/2024)   Hunger Vital Sign    Worried About Running Out of Food in the Last Year: Never true    Ran Out of Food in the Last Year: Never true  Transportation Needs: No Transportation Needs (07/23/2024)   PRAPARE - Administrator, Civil Service (Medical): No    Lack of Transportation (Non-Medical): No  Physical Activity: Inactive (05/20/2024)   Exercise Vital Sign    Days of Exercise per Week: 0 days    Minutes of Exercise per Session: Not on file  Stress: No Stress Concern Present (05/20/2024)   [employer] of Occupational Health - Occupational Stress Questionnaire    Feeling of Stress: Not at all  Social Connections: Unknown (07/14/2024)   Social Connection and Isolation Panel    Frequency of Communication with Friends and Family: More than three times a week    Frequency of Social Gatherings with Friends and Family: More than three times a week    Attends Religious Services: Not on file    Active Member of Clubs or Organizations: Yes    Attends Banker Meetings: Not on file    Marital Status: Widowed  Intimate Partner Violence: Not At Risk (07/23/2024)   Humiliation, Afraid, Rape, and Kick questionnaire    Fear of Current or Ex-Partner: No    Emotionally Abused: No     Physically Abused: No    Sexually Abused: No    FAMILY HISTORY: Family History  Problem Relation Age of Onset   Cancer Mother        jaw   Hypertension Father    Heart attack Father    Heart attack Sister    Cancer Sister        unknown primary-mets throughout body   Lung cancer Brother    Colon cancer Neg Hx    Esophageal cancer Neg Hx    Pancreatic cancer Neg Hx    Stomach cancer Neg Hx    Liver disease Neg Hx     ALLERGIES:  is allergic to other, shellfish allergy , venofer  [iron  sucrose], cephalexin , ciprofloxacin, clopidogrel bisulfate, ezetimibe -simvastatin, lidocaine , nitrofurantoin, pregabalin, celecoxib, codeine, codeine  phosphate, hydrocod poli-chlorphe poli er, propoxyphene, propoxyphene n-acetaminophen , rofecoxib, sulfa antibiotics, sulfamethoxazole, and sulfonamide derivatives.  MEDICATIONS:  Current Outpatient Medications  Medication Sig Dispense Refill   Bempedoic Acid -Ezetimibe  (NEXLIZET ) 180-10 MG TABS Take 1 tablet by mouth daily. Pt has scheduled an office visit with provider in April, 2025 90 tablet 2   Blood Glucose Monitoring Suppl DEVI 1 each by Does not apply route in the morning, at noon, and at bedtime. May substitute to any manufacturer covered by patient's insurance. 1 each 0   brimonidine  (ALPHAGAN ) 0.2 % ophthalmic solution Place 1 drop into the right eye in the morning and at bedtime.     Cholecalciferol  (VITAMIN D -3) 1000 UNITS CAPS Take 1 capsule by mouth daily.     CRANBERRY PO Take 1 tablet by mouth daily.     diclofenac  Sodium (VOLTAREN ) 1 % GEL Apply 4 g topically 4 (four) times daily. To your chest wall 350 g 1   Dulaglutide  (TRULICITY ) 0.75 MG/0.5ML SOAJ Inject 0.75 mg into the skin once a week. for diabetes. 6 mL 1   erythromycin ophthalmic ointment SMARTSIG:In Eye(s)     ezetimibe  (ZETIA ) 10 MG tablet Take 1 tablet (10 mg total) by mouth daily. 90 tablet 1   ferrous sulfate  325 (65 FE) MG tablet Take 1 tablet (325 mg total) by mouth  daily with breakfast. 30 tablet 2   fexofenadine  (ALLEGRA ) 180 MG tablet Take 1 tablet (180 mg total) by mouth as needed for allergies or rhinitis. 90 tablet 0   furosemide  (LASIX ) 20 MG tablet Take 1 tablet by mouth daily. May take additional tablet as needed for swelling 180 tablet 3   gabapentin  (NEURONTIN ) 100 MG capsule Take 200 mg by mouth 2 (two) times daily.     Glucose Blood (BLOOD GLUCOSE TEST STRIPS) STRP 1 each by In Vitro route in the morning, at noon, and at bedtime. May substitute to any manufacturer covered by patient's insurance. 300 strip 5   Lancets Misc. MISC 1 each by Does not apply route in the morning, at noon, and at bedtime. May substitute to any manufacturer covered by patient's insurance. 300 each 5   metoprolol  succinate (TOPROL -XL) 100 MG 24 hr tablet TAKE 1 TABLET BY MOUTH EVERY MORNING WITH OR IMMEDIATELY FOLLOWING A MEAL 90 tablet 3   nitroGLYCERIN  (NITROSTAT ) 0.4 MG SL tablet Place 1 tablet (0.4 mg total) under the tongue every 5 (five) minutes as needed for chest pain. 25 tablet 2   ondansetron  (ZOFRAN -ODT) 4 MG disintegrating tablet Take 1 tablet (4 mg total) by mouth every 8 (eight) hours as needed for nausea or vomiting. 20 tablet 0   pantoprazole  (PROTONIX ) 40 MG tablet Take 1 tablet (40 mg total) by mouth 2 (two) times daily. TAKE 1 TABLET BY MOUTH DAILY FOR HEARTBURN 60 tablet 12   sertraline  (ZOLOFT ) 100 MG tablet TAKE 1 TABLET BY MOUTH DAILY FOR ANXIETY AND DEPRESSION 90 tablet 0   sucralfate  (CARAFATE ) 1 g tablet Take 1 tablet (1 g total) by mouth 2 (two) times daily. (Patient taking differently: Take 1 g by mouth 4 (four) times daily.) 60 tablet 2   TOBRADEX ophthalmic ointment Place 1 Application into the right eye 3 (three) times daily.     vitamin B-12 (CYANOCOBALAMIN ) 1000 MCG tablet Take 1,000 mcg by mouth daily. (Patient taking differently: Take 1,000 mcg by mouth every other day.)     No current facility-administered medications for this visit.     PHYSICAL EXAMINATION:   There were  no vitals filed for this visit.   There were no vitals filed for this visit.  Significant bruising of the right of the face.  Physical Exam Vitals and nursing note reviewed.  HENT:     Head: Normocephalic and atraumatic.     Mouth/Throat:     Pharynx: Oropharynx is clear.  Eyes:     Extraocular Movements: Extraocular movements intact.     Pupils: Pupils are equal, round, and reactive to light.  Cardiovascular:     Rate and Rhythm: Normal rate and regular rhythm.  Pulmonary:     Comments: Decreased breath sounds bilaterally.  Abdominal:     Palpations: Abdomen is soft.  Musculoskeletal:        General: Normal range of motion.     Cervical back: Normal range of motion.  Skin:    General: Skin is warm.  Neurological:     General: No focal deficit present.     Mental Status: She is alert and oriented to person, place, and time.  Psychiatric:        Behavior: Behavior normal.        Judgment: Judgment normal.     LABORATORY DATA:  I have reviewed the data as listed Lab Results  Component Value Date   WBC 3.6 (L) 08/30/2024   HGB 12.1 08/30/2024   HCT 36.7 08/30/2024   MCV 97.9 08/30/2024   PLT 230 08/30/2024   Recent Labs    07/14/24 0539 07/15/24 0513 07/16/24 0434 07/17/24 0540 07/23/24 1345 07/23/24 1610 07/24/24 0636  NA 137 139 141   < > 137 138 140  K 4.1 3.8 4.3   < > 3.9 3.1* 4.8  CL 104 109 107   < > 104 104 108  CO2 27 23 26    < > 25 24 27   GLUCOSE 129* 144* 142*   < > 189* 160* 222*  BUN 38* 22 14   < > 10 10 13   CREATININE 1.31* 1.16* 1.28*   < > 1.14* 1.09* 1.01*  CALCIUM 8.2* 8.2* 8.4*   < > 8.6* 8.4* 8.6*  GFRNONAA 39* 45* 40*   < > 46* 48* 53*  PROT 5.0* 4.9* 5.2*  --  6.2* 6.6  --   ALBUMIN 3.0* 3.0* 3.1*  --  3.5 3.5  --   AST 25 24 23   --  20 25  --   ALT 11 11 12   --  12 12  --   ALKPHOS 21* 23* 25*  --  39 38  --   BILITOT 0.8 0.8 0.9  --  0.7 0.9  --   BILIDIR 0.1 0.2 0.2  --   --   --    --   IBILI 0.7 0.6 0.7  --   --   --   --    < > = values in this interval not displayed.    RADIOGRAPHIC STUDIES: I have personally reviewed the radiological images as listed and agreed with the findings in the report. CT Head Wo Contrast Result Date: 08/22/2024 CLINICAL DATA:  Head trauma, minor (Age >= 65y); Neck trauma (Age >= 65y). Trip and fall. Forehead hematoma. Recently on a blood thinner. EXAM: CT HEAD WITHOUT CONTRAST CT CERVICAL SPINE WITHOUT CONTRAST TECHNIQUE: Multidetector CT imaging of the head and cervical spine was performed following the standard protocol without intravenous contrast. Multiplanar CT image reconstructions of the cervical spine were also generated. RADIATION DOSE REDUCTION: This exam was performed according to the departmental  dose-optimization program which includes automated exposure control, adjustment of the mA and/or kV according to patient size and/or use of iterative reconstruction technique. COMPARISON:  Head CT 07/23/2024 and MRI 10/10/2023. Cervical spine CT 10/09/2023. FINDINGS: CT HEAD FINDINGS Brain: There is no evidence of an acute infarct, intracranial hemorrhage, mass, midline shift, or extra-axial fluid collection. Mild cerebral atrophy is within normal limits for age. Cerebral white matter hypodensities are nonspecific but compatible with mild chronic small vessel ischemic disease. Vascular: Calcified atherosclerosis at the skull base. No hyperdense vessel. Skull: No acute fracture or suspicious lesion. Sinuses/Orbits: Partially visualized chronic bilateral maxillary sinusitis. Clear mastoid air cells. Bilateral cataract extraction. Right scleral buckle. Other: Moderately large right frontal scalp hematoma. CT CERVICAL SPINE FINDINGS Alignment: Normal. Skull base and vertebrae: No acute fracture or suspicious lesion. Soft tissues and spinal canal: No prevertebral fluid or swelling. No visible canal hematoma. Disc levels: Mild-to-moderate multilevel cervical  disc degeneration and mild facet arthrosis. No evidence of high-grade spinal canal stenosis. Upper chest: Partially visualized ground-glass opacities and interlobular septal thickening in the lung apices. Other: Atherosclerotic calcification about the right greater than left carotid bifurcations. IMPRESSION: 1. No evidence of acute intracranial abnormality or cervical spine fracture. 2. Right frontal scalp hematoma. 3. Partially visualized biapical lung opacities potentially reflecting edema. Electronically Signed   By: Dasie Hamburg M.D.   On: 08/22/2024 19:48   CT Cervical Spine Wo Contrast Result Date: 08/22/2024 CLINICAL DATA:  Head trauma, minor (Age >= 65y); Neck trauma (Age >= 65y). Trip and fall. Forehead hematoma. Recently on a blood thinner. EXAM: CT HEAD WITHOUT CONTRAST CT CERVICAL SPINE WITHOUT CONTRAST TECHNIQUE: Multidetector CT imaging of the head and cervical spine was performed following the standard protocol without intravenous contrast. Multiplanar CT image reconstructions of the cervical spine were also generated. RADIATION DOSE REDUCTION: This exam was performed according to the departmental dose-optimization program which includes automated exposure control, adjustment of the mA and/or kV according to patient size and/or use of iterative reconstruction technique. COMPARISON:  Head CT 07/23/2024 and MRI 10/10/2023. Cervical spine CT 10/09/2023. FINDINGS: CT HEAD FINDINGS Brain: There is no evidence of an acute infarct, intracranial hemorrhage, mass, midline shift, or extra-axial fluid collection. Mild cerebral atrophy is within normal limits for age. Cerebral white matter hypodensities are nonspecific but compatible with mild chronic small vessel ischemic disease. Vascular: Calcified atherosclerosis at the skull base. No hyperdense vessel. Skull: No acute fracture or suspicious lesion. Sinuses/Orbits: Partially visualized chronic bilateral maxillary sinusitis. Clear mastoid air cells.  Bilateral cataract extraction. Right scleral buckle. Other: Moderately large right frontal scalp hematoma. CT CERVICAL SPINE FINDINGS Alignment: Normal. Skull base and vertebrae: No acute fracture or suspicious lesion. Soft tissues and spinal canal: No prevertebral fluid or swelling. No visible canal hematoma. Disc levels: Mild-to-moderate multilevel cervical disc degeneration and mild facet arthrosis. No evidence of high-grade spinal canal stenosis. Upper chest: Partially visualized ground-glass opacities and interlobular septal thickening in the lung apices. Other: Atherosclerotic calcification about the right greater than left carotid bifurcations. IMPRESSION: 1. No evidence of acute intracranial abnormality or cervical spine fracture. 2. Right frontal scalp hematoma. 3. Partially visualized biapical lung opacities potentially reflecting edema. Electronically Signed   By: Dasie Hamburg M.D.   On: 08/22/2024 19:48   DG Wrist Complete Right Result Date: 08/22/2024 CLINICAL DATA:  Wrist swelling after fall EXAM: RIGHT WRIST - COMPLETE 3+ VIEW COMPARISON:  None Available. FINDINGS: No acute fracture or dislocation. There is no evidence of arthropathy or other focal bone  abnormality. Soft tissues are unremarkable. IMPRESSION: No acute fracture or dislocation. Electronically Signed   By: Rogelia Myers M.D.   On: 08/22/2024 19:11   NM PET Image Initial (PI) Skull Base To Thigh (F-18 FDG) Result Date: 08/10/2024 CLINICAL DATA:  Initial treatment strategy for gastric mass. EXAM: NUCLEAR MEDICINE PET SKULL BASE TO THIGH TECHNIQUE: 9.3 mCi F-18 FDG was injected intravenously. Full-ring PET imaging was performed from the skull base to thigh after the radiotracer. CT data was obtained and used for attenuation correction and anatomic localization. Fasting blood glucose: 176 mg/dl COMPARISON:  1/84/7974 FINDINGS: Mediastinal blood pool activity: SUV max 3.0 Liver activity: SUV max NA NECK: No significant abnormal  hypermetabolic activity in this region. Incidental CT findings: Chronic bilateral maxillary sinusitis. Bilateral common carotid atheromatous vascular calcification. CHEST: 1.0 cm left lower lobe pulmonary nodule on image 70 series 6 has a maximum SUV of 2.5, borderline for malignancy. This nodule was not present on 08/09/2013. Incidental CT findings: Atelectasis or scarring in the lingula, right middle lobe, and right lower lobe. Coronary, aortic arch, and branch vessel atherosclerotic vascular disease. Moderate cardiomegaly. ABDOMEN/PELVIS: Proximally 6.6 cm gastric mass on image 91 series 6 has diffuse low-grade metabolic activity with maximum SUV 3.2, just above blood pool. No splenomegaly or splenic lesion observed. Incidental CT findings: Atherosclerosis is present, including aortoiliac atherosclerotic disease. Benign peripelvic renal cysts warrant no further imaging workup. Sigmoid diverticulosis. Uterus absent. SKELETON: New fracture of the upper sternal body extending into the manubriosternal joint, maximum SUV 5.2, felt to be posttraumatic rather than malignant. Faintly accentuated metabolic activity anteriorly in the right second, fourth, and fifth ribs compatible with nondisplaced fractures. Incidental CT findings: Lower lumbar spondylosis and degenerative disc disease with grade 1 anterolisthesis at L5-S1 attributable to bilateral chronic pars defects. IMPRESSION: 1. 6.6 cm gastric mass has diffuse low-grade metabolic activity, just above blood pool. 2. 1.0 cm left lower lobe pulmonary nodule has a maximum SUV of 2.5, borderline for malignancy. This nodule was not present on 08/09/2013. A small lung cancer is not excluded. 3. New fracture of the upper sternal body extending into the manubriosternal joint. Faintly accentuated metabolic activity anteriorly in the right second, fourth, and fifth ribs compatible with nondisplaced fractures. Reportedly the patient received chest compressions around 07/23/2024.  4. Chronic bilateral maxillary sinusitis. 5. Lower lumbar spondylosis and degenerative disc disease with grade 1 anterolisthesis at L5-S1 attributable to bilateral chronic pars defects. 6.  Aortic Atherosclerosis (ICD10-I70.0). Electronically Signed   By: Ryan Salvage M.D.   On: 08/10/2024 11:48     Gastrointestinal stromal tumor (GIST) of body of stomach (HCC) # OCT 2025- Spindle cell neoplasm consistent with gastrointestinal stromal tumor (GIST), spindle cell type AUG 2025- CT A/P: Large mass within the lumen of the stomach-  Large round well-circumscribed mass in the mid stomach measures 6.3 x 6.3 cm (image 63) mass is contained within lumen of the stomach.. No evidence of metastatic adenopathy in the abdomen pelvis. No liver metastasis. No peritoneal metastasis. CT chest:  Solitary pulmonary nodule in the RIGHT lower lobe is new from 2014. Indeterminate finding. FDG PET scan may aid in determining malignant potential. 2. No mediastinal adenopathy.  PET scan:  6.6 cm gastric mass has diffuse low-grade metabolic activity, 1.0 cm left lower lobe pulmonary nodule has a maximum SUV of 2.5, borderline for malignancy. This nodule was not present on 08/09/2013.   # AUG 2025EGD biops-y negative/inconclusive.  SEP 2025- S/p endoscopic ultrasound with biopsy [Dr.Mansouraty, [place]]- positive for  spindle cell neoplasm.  EUS-suggestive of subepithelial/from the muscularis.  however, given the a cellularity-unable to perform IHC stains-differential diagnosis includes GIST tumor/leiomyoma vs others. Liquid biopsy-UNREMARKABLE.    # patient awaiting repeat EUS with biopsy today; discussed with GI- will plan GLEEVEC if pathology suggestive of GIST.    # Severe anemia-second iron  deficiency/c acute on hronic GI bleed secondary to #1-today hemoglobin is 12. -iron  infusion discontinued because of grade 4 anaphylactic reaction.  Continue oral iron  once a day.   # History of A-fib - OFF Eliquis -[Dr.Gollan]- s/p  cardiology- consider Watchman device- defer to cardiology.     # DISPOSITION: # No blood- cancel tomorrow appt # follow up TBD- -     Above plan of care was discussed with patient/family in detail.  My contact information was given to the patient/family.     Cindy JONELLE Joe, MD 09/03/2024 6:40 PM

## 2024-09-04 ENCOUNTER — Encounter: Payer: Self-pay | Admitting: Internal Medicine

## 2024-09-04 ENCOUNTER — Other Ambulatory Visit (HOSPITAL_COMMUNITY): Payer: Self-pay

## 2024-09-04 ENCOUNTER — Other Ambulatory Visit: Payer: Self-pay | Admitting: *Deleted

## 2024-09-04 ENCOUNTER — Other Ambulatory Visit

## 2024-09-04 ENCOUNTER — Telehealth: Payer: Self-pay | Admitting: Pharmacy Technician

## 2024-09-04 ENCOUNTER — Inpatient Hospital Stay (HOSPITAL_BASED_OUTPATIENT_CLINIC_OR_DEPARTMENT_OTHER): Admitting: Internal Medicine

## 2024-09-04 VITALS — BP 140/84 | HR 83 | Temp 97.4°F | Resp 16 | Ht 69.0 in | Wt 162.6 lb

## 2024-09-04 DIAGNOSIS — C49A2 Gastrointestinal stromal tumor of stomach: Secondary | ICD-10-CM

## 2024-09-04 DIAGNOSIS — D649 Anemia, unspecified: Secondary | ICD-10-CM

## 2024-09-04 DIAGNOSIS — Z801 Family history of malignant neoplasm of trachea, bronchus and lung: Secondary | ICD-10-CM | POA: Diagnosis not present

## 2024-09-04 DIAGNOSIS — D500 Iron deficiency anemia secondary to blood loss (chronic): Secondary | ICD-10-CM | POA: Diagnosis not present

## 2024-09-04 DIAGNOSIS — Z8572 Personal history of non-Hodgkin lymphomas: Secondary | ICD-10-CM | POA: Diagnosis not present

## 2024-09-04 DIAGNOSIS — Z808 Family history of malignant neoplasm of other organs or systems: Secondary | ICD-10-CM | POA: Diagnosis not present

## 2024-09-04 DIAGNOSIS — R911 Solitary pulmonary nodule: Secondary | ICD-10-CM | POA: Diagnosis not present

## 2024-09-04 LAB — SAMPLE TO BLOOD BANK

## 2024-09-04 LAB — CBC WITH DIFFERENTIAL (CANCER CENTER ONLY)
Abs Immature Granulocytes: 0.04 K/uL (ref 0.00–0.07)
Basophils Absolute: 0 K/uL (ref 0.0–0.1)
Basophils Relative: 0 %
Eosinophils Absolute: 0.1 K/uL (ref 0.0–0.5)
Eosinophils Relative: 1 %
HCT: 34.7 % — ABNORMAL LOW (ref 36.0–46.0)
Hemoglobin: 11.7 g/dL — ABNORMAL LOW (ref 12.0–15.0)
Immature Granulocytes: 1 %
Lymphocytes Relative: 28 %
Lymphs Abs: 1.3 K/uL (ref 0.7–4.0)
MCH: 33 pg (ref 26.0–34.0)
MCHC: 33.7 g/dL (ref 30.0–36.0)
MCV: 97.7 fL (ref 80.0–100.0)
Monocytes Absolute: 0.4 K/uL (ref 0.1–1.0)
Monocytes Relative: 9 %
Neutro Abs: 2.8 K/uL (ref 1.7–7.7)
Neutrophils Relative %: 61 %
Platelet Count: 238 K/uL (ref 150–400)
RBC: 3.55 MIL/uL — ABNORMAL LOW (ref 3.87–5.11)
RDW: 13.8 % (ref 11.5–15.5)
WBC Count: 4.6 K/uL (ref 4.0–10.5)
nRBC: 0 % (ref 0.0–0.2)

## 2024-09-04 MED ORDER — IMATINIB MESYLATE 400 MG PO TABS
400.0000 mg | ORAL_TABLET | Freq: Every day | ORAL | 1 refills | Status: DC
  Filled 2024-09-05: qty 30, 30d supply, fill #0
  Filled 2024-09-27 – 2024-09-28 (×2): qty 30, 30d supply, fill #1

## 2024-09-04 NOTE — Telephone Encounter (Signed)
 Oral Oncology Patient Advocate Encounter   New authorization   Received notification that prior authorization for Imatinib is required.   PA submitted on CMM via Latent Key [id number] Status is pending     [name] [name] ([name]) Chet Burnet, CPhT  [facility], [facility], [facility] - Oral Chemotherapy Patient Advocate Specialist III Phone: [phone]  Fax: [phone]

## 2024-09-04 NOTE — Addendum Note (Signed)
 Addended by: RODGERS RENAEE SAILOR on: 09/04/2024 04:10 PM   Modules accepted: Orders

## 2024-09-04 NOTE — Telephone Encounter (Signed)
 Oral Oncology Patient Advocate Encounter  Prior Authorization for Imatinib has been approved.    PA# [id number] Effective dates: 11/30/2023 through 11/28/2025  Patients co-pay is $0.    [place] ([name]) Chet Burnet, CPhT  [facility], [facility], [facility] - Oral Chemotherapy Patient Advocate Specialist III Phone: [phone]  Fax: [phone]

## 2024-09-04 NOTE — Progress Notes (Signed)
 She feels like her rt eyelid is still hanging down.  Endoscopy 08/30/24.

## 2024-09-05 ENCOUNTER — Telehealth: Payer: Self-pay | Admitting: Pharmacy Technician

## 2024-09-05 ENCOUNTER — Other Ambulatory Visit (HOSPITAL_COMMUNITY): Payer: Self-pay

## 2024-09-05 ENCOUNTER — Other Ambulatory Visit: Payer: Self-pay

## 2024-09-05 ENCOUNTER — Other Ambulatory Visit: Payer: Self-pay | Admitting: Pharmacy Technician

## 2024-09-05 ENCOUNTER — Other Ambulatory Visit: Payer: Self-pay | Admitting: *Deleted

## 2024-09-05 ENCOUNTER — Telehealth: Payer: Self-pay

## 2024-09-05 DIAGNOSIS — R112 Nausea with vomiting, unspecified: Secondary | ICD-10-CM

## 2024-09-05 MED ORDER — ONDANSETRON 4 MG PO TBDP: 4.0000 mg | ORAL_TABLET | Freq: Three times a day (TID) | ORAL | 0 refills | Status: AC | PRN

## 2024-09-05 NOTE — Progress Notes (Signed)
 Oral Chemotherapy Pharmacist Encounter  Patient was counseled under telephone encounter from 09/05/24.  [name] [name], PharmD, BCPS, BCOP Hematology/Oncology Clinical Pharmacist [facility] Oral Chemotherapy Navigation Clinics [phone] 09/05/2024 10:17 AM

## 2024-09-05 NOTE — Telephone Encounter (Signed)
 Oral Chemotherapy Pharmacist Encounter  Patient Education I spoke with patient for overview of new oral chemotherapy medication: Gleevec (imatinib) for the treatment of newly diagnosed GIST, planned duration until disease progression or unacceptable drug toxicity.   Treatment goal: Palliative  Counseled patient on administration, dosing, side effects, monitoring, drug-food interactions, safe handling, storage, and disposal.  Patient will receive Imatinib 400 mg tablets, 1 tablet (400 mg total) by mouth daily. Patient encouraged to take imatinib with food to decrease GI upset and maintain hydration while on imatinib. Encouraged patient to avoid grapefruit/grapefruit juice while on imatinib.   Start date: 09/08/2024  Side effects include but not limited to: decreased blood counts, nausea/vomiting, diarrhea, abdominal pain, rash, fatigue, edema, muscle aches, change in LFTs.   Supportive Care Medications: -Nausea/vomiting: patient requested prescriptions for Zofran . Alerted provider to request prescriptions.   -Diarrhea: patient will obtain Imodium (loperamide) to have on hand if they experience diarrhea. Patient knows to alert the office of 4 or more loose stools above baseline. -Muscle Aches: discussed that tonic water can help with muscle aches while on imatinib. -Rash: encouraged use of moisturizer cream while on imatinib. -Decreased blood counts and LFT changes: patient will be monitored with monthly CBC w/ diff and CMP.   Drug-Drug Interactions (DDI): -Category C drug-drug interaction between sertraline  and imatinib - sertraline  may increase hepatotoxic effects of imatinib. Recommend monitoring CMP for elevated LFTs, increased bilirubin.   Reviewed with patient importance of keeping a medication schedule and plan for any missed doses.   After discussion with patient, no patient barriers to medication adherence identified.   Distress thermometer flowsheet: Distress thermometer  completed during telephone call and reviewed with patient. Due to score, social work referral has not been sent.   Communication and Learning Assessment Primary learner: Patient Barriers to learning: No barriers Preferred language: English Learning preferences: Listening  Patient voiced understanding and appreciation. All questions answered. Medication handout provided.  Provided patient with Oral Chemotherapy Navigation Clinic phone number. Patient knows to call the office with questions or concerns.  Dionicia Canavan, PharmD, RPh PGY1 Acute Care Pharmacy Resident [facility]  09/05/2024 10:42 AM

## 2024-09-05 NOTE — Progress Notes (Signed)
 Specialty Pharmacy Initial Fill Coordination Note  [name] [name] is a 89 y.o. female contacted today regarding refills of specialty medication(s) Imatinib Mesylate (GLEEVEC) .  Patient requested Delivery  on 09/07/24  to verified address [address] [place] [address] (ADDRESS IS DIFFERENT FROM WHAT IS IN PATIENT'S CHART)   Medication will be filled on 10/09.   Patient is aware of $0 copayment.   [name] ([name]) Chet Burnet, CPhT  [facility], [facility], [facility] - Oral Chemotherapy Patient Advocate Specialist III Phone: [phone]  Fax: [phone]

## 2024-09-05 NOTE — Telephone Encounter (Signed)
 Oral Oncology Patient Advocate Encounter  Patient successfully OnBoarded and drug education provided by pharmacist. Medication scheduled to be shipped on 10/09 for delivery on 10/10 from [facility] to patient's address. Patient also knows to call me at [phone] with any questions or concerns regarding receiving medication or if there is any unexpected change in co-pay.    [name] [name] ([name]) Chet Burnet, CPhT  [facility], [facility], [facility] - Oral Chemotherapy Patient Advocate Specialist III Phone: [phone]  Fax: [phone]

## 2024-09-05 NOTE — Telephone Encounter (Signed)
 Oral Oncology Pharmacist Encounter  Received new prescription for Gleevec (imatinib) for the treatment of GIST, planned duration: indefinite.  CBC from 10/7 and CMP from 8/25 assessed. No baseline dose adjustments required based on labs. Prescription dose and frequency assessed for appropriateness.   Current medication list in Epic reviewed, DDIs with sertraline  identified:  Category C drug-drug interaction between Sertraline  and Imatinib - sertraline  may increase hepatotoxic effects of imatinib. Recommend monitoring CMP for elevated LFTs, increased bilirubin.   Evaluated chart and no patient barriers to medication adherence noted.   Patient agreement for treatment documented in MD note on 09/04/24.  Prescription has been e-scribed to the [facility] for benefits analysis and approval. Copay is $0.   Oral Oncology Clinic will continue to follow for insurance authorization, copayment issues, initial counseling and start date.  Dionicia Canavan, PharmD, RPh PGY1 Acute Care Pharmacy Resident [facility]  09/05/2024 10:21 AM  Oral Oncology Clinic [phone]

## 2024-09-06 ENCOUNTER — Encounter

## 2024-09-06 NOTE — Progress Notes (Signed)
 [facility] NGS requested on specimen [id number], gastric mass, collected 08/30/2024. Spoke with [facility] and confirmed there is enough tissue for NGS.

## 2024-09-12 ENCOUNTER — Telehealth: Payer: Self-pay | Admitting: *Deleted

## 2024-09-12 NOTE — Telephone Encounter (Signed)
 Rceived Peer to Peer request from [facility] for [facility] NGS testing. Patient has stage IB GIST. Insurance will most likely deny paying for testing.

## 2024-09-15 DIAGNOSIS — H401133 Primary open-angle glaucoma, bilateral, severe stage: Secondary | ICD-10-CM | POA: Diagnosis not present

## 2024-09-15 DIAGNOSIS — H16001 Unspecified corneal ulcer, right eye: Secondary | ICD-10-CM | POA: Diagnosis not present

## 2024-09-15 DIAGNOSIS — H0011 Chalazion right upper eyelid: Secondary | ICD-10-CM | POA: Diagnosis not present

## 2024-09-17 ENCOUNTER — Telehealth: Payer: Self-pay

## 2024-09-17 NOTE — Telephone Encounter (Signed)
 Oncology Pharmacist Encounter   Received voicemail from patient asking if they can take some Tylenol  as patient has had a fall and her knees hurt and she cannot sleep. Patient states that she did go to the doctors and they did scan her to make sure she did not have a bleed. Notified patient that she can take some tylenol  to help her sleep at night but that I would not take it around the clock as both medications can increase her LFTs. Patient understands the plan. Notified patient that I will notify the MD office that she had a recent fall.   Prachi Oftedahl, PharmD Hematology/Oncology Clinical Pharmacist [facility] Oral Chemotherapy Navigation Clinic [phone]

## 2024-09-18 ENCOUNTER — Encounter: Payer: Self-pay | Admitting: Internal Medicine

## 2024-09-23 NOTE — Progress Notes (Signed)
 Mckenleigh Tarlton T. Grae Cannata, MD, CAQ Sports Medicine [facility] at [facility] [address] [place], [address]  Phone: [phone]  FAX: [phone]  [name] [name] - 89 y.o. female  MRN [id number]  Date of Birth: [birth year]  Date: 09/24/2024  PCP: Gretta Comer POUR, NP  Referral: Gretta Comer POUR, NP  No chief complaint on file.  Subjective:   [name] [name] is a 89 y.o. very pleasant female patient with There is no height or weight on file to calculate BMI. who presents with the following:  Discussed the use of AI scribe software for clinical note transcription with the patient, who gave verbal consent to proceed.  Patient presents with ongoing acute knee pain after trauma.  I last saw her for knee pain in June 2025.  She does have known very significant arthritis bilaterally. History of Present Illness     Review of Systems is noted in the HPI, as appropriate  Objective:   There were no vitals taken for this visit.  GEN: No acute distress; alert,appropriate. PULM: Breathing comfortably in no respiratory distress PSYCH: Normally interactive.   Laboratory and Imaging Data:  Assessment and Plan:   No diagnosis found. Assessment & Plan   Medication Management during today's office visit: No orders of the defined types were placed in this encounter.  There are no discontinued medications.  Orders placed today for conditions managed today: No orders of the defined types were placed in this encounter.   Disposition: No follow-ups on file.  Dragon [employer] One speech-to-text software was used for transcription in this dictation.  Possible transcriptional errors can occur using Animal nutritionist.   Signed,  Jacques DASEN. Donnie Panik, MD   Outpatient Encounter Medications as of 09/24/2024  Medication Sig   Bempedoic Acid -Ezetimibe  (NEXLIZET ) 180-10 MG TABS Take 1 tablet by mouth daily. Pt has scheduled an office visit with provider in April,  2025   Blood Glucose Monitoring Suppl DEVI 1 each by Does not apply route in the morning, at noon, and at bedtime. May substitute to any manufacturer covered by patient's insurance.   brimonidine  (ALPHAGAN ) 0.2 % ophthalmic solution Place 1 drop into the right eye in the morning and at bedtime.   Cholecalciferol  (VITAMIN D -3) 1000 UNITS CAPS Take 1 capsule by mouth daily.   CRANBERRY PO Take 1 tablet by mouth daily.   diclofenac  Sodium (VOLTAREN ) 1 % GEL Apply 4 g topically 4 (four) times daily. To your chest wall   Dulaglutide  (TRULICITY ) 0.75 MG/0.5ML SOAJ Inject 0.75 mg into the skin once a week. for diabetes.   erythromycin ophthalmic ointment SMARTSIG:In Eye(s)   ezetimibe  (ZETIA ) 10 MG tablet Take 1 tablet (10 mg total) by mouth daily.   ferrous sulfate  325 (65 FE) MG tablet Take 1 tablet (325 mg total) by mouth daily with breakfast.   fexofenadine  (ALLEGRA ) 180 MG tablet Take 1 tablet (180 mg total) by mouth as needed for allergies or rhinitis.   furosemide  (LASIX ) 20 MG tablet Take 1 tablet by mouth daily. May take additional tablet as needed for swelling   gabapentin  (NEURONTIN ) 100 MG capsule Take 200 mg by mouth 2 (two) times daily.   Glucose Blood (BLOOD GLUCOSE TEST STRIPS) STRP 1 each by In Vitro route in the morning, at noon, and at bedtime. May substitute to any manufacturer covered by patient's insurance.   imatinib (GLEEVEC) 400 MG tablet Take 1 tablet (400 mg total) by mouth daily. Take with meals and large glass  of water.   Lancets Misc. MISC 1 each by Does not apply route in the morning, at noon, and at bedtime. May substitute to any manufacturer covered by patient's insurance.   metoprolol  succinate (TOPROL -XL) 100 MG 24 hr tablet TAKE 1 TABLET BY MOUTH EVERY MORNING WITH OR IMMEDIATELY FOLLOWING A MEAL   nitroGLYCERIN  (NITROSTAT ) 0.4 MG SL tablet Place 1 tablet (0.4 mg total) under the tongue every 5 (five) minutes as needed for chest pain.   ondansetron  (ZOFRAN -ODT) 4 MG  disintegrating tablet Take 1 tablet (4 mg total) by mouth every 8 (eight) hours as needed for nausea or vomiting.   pantoprazole  (PROTONIX ) 40 MG tablet Take 1 tablet (40 mg total) by mouth 2 (two) times daily. TAKE 1 TABLET BY MOUTH DAILY FOR HEARTBURN   sertraline  (ZOLOFT ) 100 MG tablet TAKE 1 TABLET BY MOUTH DAILY FOR ANXIETY AND DEPRESSION   sucralfate  (CARAFATE ) 1 g tablet Take 1 tablet (1 g total) by mouth 2 (two) times daily. (Patient taking differently: Take 1 g by mouth 4 (four) times daily.)   TOBRADEX ophthalmic ointment Place 1 Application into the right eye 3 (three) times daily.   vitamin B-12 (CYANOCOBALAMIN ) 1000 MCG tablet Take 1,000 mcg by mouth daily. (Patient taking differently: Take 1,000 mcg by mouth every other day.)   [DISCONTINUED] loratadine  (CLARITIN ) 10 MG tablet Take 10 mg by mouth as needed.    No facility-administered encounter medications on file as of 09/24/2024.

## 2024-09-24 ENCOUNTER — Other Ambulatory Visit: Payer: Self-pay

## 2024-09-24 ENCOUNTER — Ambulatory Visit: Admitting: Family Medicine

## 2024-09-24 VITALS — BP 110/70 | HR 97 | Temp 97.4°F | Ht 69.0 in | Wt 167.2 lb

## 2024-09-24 DIAGNOSIS — M25561 Pain in right knee: Secondary | ICD-10-CM | POA: Diagnosis not present

## 2024-09-24 DIAGNOSIS — M25562 Pain in left knee: Secondary | ICD-10-CM | POA: Diagnosis not present

## 2024-09-24 DIAGNOSIS — M170 Bilateral primary osteoarthritis of knee: Secondary | ICD-10-CM

## 2024-09-24 MED ORDER — TRIAMCINOLONE ACETONIDE 40 MG/ML IJ SUSP
40.0000 mg | Freq: Once | INTRAMUSCULAR | Status: AC
  Administered 2024-09-24: 40 mg via INTRA_ARTICULAR

## 2024-09-25 ENCOUNTER — Encounter: Payer: Self-pay | Admitting: Family Medicine

## 2024-09-26 ENCOUNTER — Other Ambulatory Visit: Payer: Self-pay

## 2024-09-27 ENCOUNTER — Other Ambulatory Visit: Payer: Self-pay

## 2024-09-28 ENCOUNTER — Other Ambulatory Visit: Payer: Self-pay

## 2024-09-28 ENCOUNTER — Telehealth: Payer: Self-pay

## 2024-09-28 NOTE — Telephone Encounter (Signed)
 For the past week patient is having increased urinary frequency during the night with the need to get up every hour.  Does have lower extremity edema that is not new.  Previously took Furosemide  but stopped because it wasn't helping the edema.  Has not had  UTI in years but explains a body numbing sensation with urge to urinate that improves after urination.  This was a sign she would have with prior UTIs.  Taking cranberry tablets on routine basis

## 2024-09-28 NOTE — Telephone Encounter (Addendum)
 Returned patient's phone call. She declined smc apts. Patient reports that the urinary frequency is worse at bedtime. She does admit to drinking teas as night and 16 ounces of water before bed with her routine medication. She denies any would like to continue monitoring symptoms for now.  She will also try to cut back on her fluid intake and teas before bedtime.

## 2024-09-28 NOTE — Progress Notes (Signed)
 Specialty Pharmacy Ongoing Clinical Assessment Note  [name] [name] is a 89 y.o. female who is being followed by the specialty pharmacy service for RxSp Oncology   Patient's specialty medication(s) reviewed today: Imatinib Mesylate (GLEEVEC)   Missed doses in the last 4 weeks: 0   Patient/Caregiver did not have any additional questions or concerns.   Therapeutic benefit summary: Unable to assess   Adverse events/side effects summary: Experienced adverse events/side effects (increased urinary frequency)   Patient's therapy is appropriate to: Continue    Goals Addressed             This Visit's Progress    Slow Disease Progression       Patient is unable to be assessed as therapy was recently initiated. Patient will maintain adherence         Follow up: 3 months  Jan Olano M Maralyn Witherell Specialty Pharmacist

## 2024-09-28 NOTE — Progress Notes (Signed)
 Clinical Intervention Note  Clinical Intervention Notes: Patient having increased urinary frequency overnight and is waking at least hourly. Advised patient to speak to provider's nurse which she did shortly after we initially spoke. While provider has ok'd us  to refill they are having her change her drinking habits (avoid tea and soft drinks, increase water intake) over the weekend and planning to test further on Monday if symptoms have not resolved.   Clinical Intervention Outcomes: Prevention of an adverse drug event   [name] [name] Specialty Pharmacist

## 2024-09-28 NOTE — Progress Notes (Signed)
 Specialty Pharmacy Refill Coordination Note  [name] [name] is a 89 y.o. female contacted today regarding refills of specialty medication(s) Imatinib Mesylate (GLEEVEC)   Patient requested Delivery   Delivery date: 10/03/24   Verified address: [address] [name] [address]   Medication will be filled on: 10/02/24

## 2024-09-30 ENCOUNTER — Other Ambulatory Visit: Payer: Self-pay | Admitting: Primary Care

## 2024-09-30 ENCOUNTER — Other Ambulatory Visit: Payer: Self-pay | Admitting: Cardiovascular Disease

## 2024-09-30 DIAGNOSIS — I4891 Unspecified atrial fibrillation: Secondary | ICD-10-CM

## 2024-09-30 DIAGNOSIS — K219 Gastro-esophageal reflux disease without esophagitis: Secondary | ICD-10-CM

## 2024-10-01 ENCOUNTER — Other Ambulatory Visit: Payer: Self-pay

## 2024-10-01 ENCOUNTER — Telehealth: Payer: Self-pay | Admitting: *Deleted

## 2024-10-01 ENCOUNTER — Encounter: Payer: Self-pay | Admitting: Nurse Practitioner

## 2024-10-01 ENCOUNTER — Inpatient Hospital Stay

## 2024-10-01 ENCOUNTER — Inpatient Hospital Stay: Attending: Internal Medicine

## 2024-10-01 ENCOUNTER — Inpatient Hospital Stay: Admitting: Nurse Practitioner

## 2024-10-01 VITALS — BP 124/74 | HR 90 | Temp 97.9°F | Resp 20 | Ht 69.0 in | Wt 163.4 lb

## 2024-10-01 DIAGNOSIS — E860 Dehydration: Secondary | ICD-10-CM

## 2024-10-01 DIAGNOSIS — D509 Iron deficiency anemia, unspecified: Secondary | ICD-10-CM | POA: Diagnosis not present

## 2024-10-01 DIAGNOSIS — M7989 Other specified soft tissue disorders: Secondary | ICD-10-CM | POA: Insufficient documentation

## 2024-10-01 DIAGNOSIS — R911 Solitary pulmonary nodule: Secondary | ICD-10-CM | POA: Insufficient documentation

## 2024-10-01 DIAGNOSIS — C49A2 Gastrointestinal stromal tumor of stomach: Secondary | ICD-10-CM | POA: Diagnosis not present

## 2024-10-01 DIAGNOSIS — C49A0 Gastrointestinal stromal tumor, unspecified site: Secondary | ICD-10-CM

## 2024-10-01 DIAGNOSIS — N3000 Acute cystitis without hematuria: Secondary | ICD-10-CM

## 2024-10-01 DIAGNOSIS — R944 Abnormal results of kidney function studies: Secondary | ICD-10-CM | POA: Diagnosis not present

## 2024-10-01 DIAGNOSIS — E871 Hypo-osmolality and hyponatremia: Secondary | ICD-10-CM | POA: Diagnosis not present

## 2024-10-01 DIAGNOSIS — E1165 Type 2 diabetes mellitus with hyperglycemia: Secondary | ICD-10-CM | POA: Diagnosis not present

## 2024-10-01 DIAGNOSIS — R350 Frequency of micturition: Secondary | ICD-10-CM

## 2024-10-01 DIAGNOSIS — R739 Hyperglycemia, unspecified: Secondary | ICD-10-CM | POA: Diagnosis not present

## 2024-10-01 DIAGNOSIS — N1832 Chronic kidney disease, stage 3b: Secondary | ICD-10-CM | POA: Diagnosis not present

## 2024-10-01 DIAGNOSIS — R7989 Other specified abnormal findings of blood chemistry: Secondary | ICD-10-CM | POA: Diagnosis not present

## 2024-10-01 DIAGNOSIS — N390 Urinary tract infection, site not specified: Secondary | ICD-10-CM | POA: Insufficient documentation

## 2024-10-01 DIAGNOSIS — Z801 Family history of malignant neoplasm of trachea, bronchus and lung: Secondary | ICD-10-CM | POA: Diagnosis not present

## 2024-10-01 DIAGNOSIS — R609 Edema, unspecified: Secondary | ICD-10-CM | POA: Insufficient documentation

## 2024-10-01 DIAGNOSIS — I4891 Unspecified atrial fibrillation: Secondary | ICD-10-CM | POA: Insufficient documentation

## 2024-10-01 LAB — CBC WITH DIFFERENTIAL (CANCER CENTER ONLY)
Abs Immature Granulocytes: 0.03 K/uL (ref 0.00–0.07)
Basophils Absolute: 0 K/uL (ref 0.0–0.1)
Basophils Relative: 0 %
Eosinophils Absolute: 0.1 K/uL (ref 0.0–0.5)
Eosinophils Relative: 2 %
HCT: 41 % (ref 36.0–46.0)
Hemoglobin: 13.9 g/dL (ref 12.0–15.0)
Immature Granulocytes: 1 %
Lymphocytes Relative: 23 %
Lymphs Abs: 1.1 K/uL (ref 0.7–4.0)
MCH: 32.6 pg (ref 26.0–34.0)
MCHC: 33.9 g/dL (ref 30.0–36.0)
MCV: 96 fL (ref 80.0–100.0)
Monocytes Absolute: 0.4 K/uL (ref 0.1–1.0)
Monocytes Relative: 7 %
Neutro Abs: 3.3 K/uL (ref 1.7–7.7)
Neutrophils Relative %: 67 %
Platelet Count: 181 K/uL (ref 150–400)
RBC: 4.27 MIL/uL (ref 3.87–5.11)
RDW: 12.8 % (ref 11.5–15.5)
WBC Count: 4.9 K/uL (ref 4.0–10.5)
nRBC: 0 % (ref 0.0–0.2)

## 2024-10-01 LAB — BASIC METABOLIC PANEL - CANCER CENTER ONLY
Anion gap: 11 (ref 5–15)
BUN: 27 mg/dL — ABNORMAL HIGH (ref 8–23)
CO2: 26 mmol/L (ref 22–32)
Calcium: 8.5 mg/dL — ABNORMAL LOW (ref 8.9–10.3)
Chloride: 96 mmol/L — ABNORMAL LOW (ref 98–111)
Creatinine: 1.58 mg/dL — ABNORMAL HIGH (ref 0.44–1.00)
GFR, Estimated: 31 mL/min — ABNORMAL LOW (ref >60)
Glucose, Bld: 231 mg/dL — ABNORMAL HIGH (ref 70–99)
Potassium: 4.5 mmol/L (ref 3.5–5.1)
Sodium: 133 mmol/L — ABNORMAL LOW (ref 135–145)

## 2024-10-01 LAB — URINALYSIS, COMPLETE (UACMP) WITH MICROSCOPIC
Bilirubin Urine: NEGATIVE
Glucose, UA: NEGATIVE mg/dL
Hgb urine dipstick: NEGATIVE
Ketones, ur: NEGATIVE mg/dL
Leukocytes,Ua: NEGATIVE
Nitrite: NEGATIVE
Protein, ur: 100 mg/dL — AB
Specific Gravity, Urine: 1.032 — ABNORMAL HIGH (ref 1.005–1.030)
pH: 5 (ref 5.0–8.0)

## 2024-10-01 LAB — SAMPLE TO BLOOD BANK

## 2024-10-01 MED ORDER — DOXYCYCLINE HYCLATE 100 MG PO TABS: 100.0000 mg | ORAL_TABLET | Freq: Two times a day (BID) | ORAL | 0 refills | Status: AC

## 2024-10-01 MED ORDER — SODIUM CHLORIDE 0.9 % IV SOLN
Freq: Once | INTRAVENOUS | Status: AC
  Filled 2024-10-01: qty 250

## 2024-10-01 NOTE — Telephone Encounter (Signed)
 Pt last saw Dr Perla 08/21/24, last labs 07/24/24 Creat 1.01, age 89, weight 75.9kg, based on specified criteria pt is on appropriate dosage of Eliquis  5mg  BID for afib.  Will refill rx.

## 2024-10-01 NOTE — Telephone Encounter (Signed)
 Patient called to report that she is continuing to have urinary frequency through out the night and day. She would like and an appointment for evaluation and treatment if necessary.

## 2024-10-01 NOTE — Progress Notes (Signed)
  Cancer Center CONSULT NOTE  Patient Care Team: Gretta Comer POUR, NP as PCP - General (Internal Medicine) Perla Evalene PARAS, MD as PCP - Cardiology (Cardiology) Alline Lenis, MD (Inactive) as Consulting Physician (Urology) Portia Fireman, OD as Consulting Physician (Optometry) Homsher, Wanda, RN as [facility] Rennie Cindy SAUNDERS, MD as Consulting Physician (Oncology) Maurie Rayfield BIRCH, RN as Oncology Nurse Navigator  CHIEF COMPLAINTS/PURPOSE OF CONSULTATION: urinary frequency x 2 weeks  Oncology History Overview Note  Gastric tumor- AUG 2025- CT A/P: Large mass within the lumen of the stomach-  Large round well-circumscribed mass in the mid stomach measures 6.3 x 6.3 cm (image 63) mass is contained within lumen of the stomach.. No evidence of metastatic adenopathy in the abdomen pelvis. No liver metastasis. No peritoneal metastasis. CT chest:  Solitary pulmonary nodule in the RIGHT lower lobe is new from 2014. Indeterminate finding. FDG PET scan may aid in determining malignant potential. 2. No mediastinal adenopathy.  PET scan:  6.6 cm gastric mass has diffuse low-grade metabolic activity, 1.0 cm left lower lobe pulmonary nodule has a maximum SUV of 2.5, borderline for malignancy. This nodule was not present on 08/09/2013.   # AUG 2025EGD biops-y negative/inconclusive.  SEP 2025- S/p endoscopic ultrasound with biopsy [Dr.Mansouraty, [place]]- positive for spindle cell neoplasm.  EUS-suggestive of subepithelial/from the muscularis.  however, given the a cellularity-unable to perform IHC stains-differential diagnosis includes GIST tumor/leiomyoma vs others.  Ordered-liquid biopsy.     # NHL/of the bowel - [[facility]]treated >25  y ago- RT.  Chemo x 6- followed by RT- No surgery.    Malignant spindle cell neoplasm (HCC)  08/16/2024 Initial Diagnosis   Malignant spindle cell neoplasm (HCC)     HISTORY OF PRESENTING ILLNESS:   Accompanied by family.  Walking with a rolling  walker  [name] [name] 89 y.o.  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   Patient reports that she is having increased urinary frequency over the past 2 weeks, however it has became more frequent this week.  She denies any dysuria, no pain, no symptoms of retention or fever/chills.  We did draw routine labs and collect ua c&s today.  She reports feeling this way in the past when she was developing a bladder infection.  In reviewing her labs her cbc was WNL.  Metabolic panel showed elevated blood glucose, mild hyponatremia, and elevated BUN and  creatinine.  Patient reports drinking around 3-4 glasses of water daily and some tea at night which she was recently stopped.    Her UA showed protein, hazy, bacteria with WBC present. I will go ahead and proceed with empiric treatment for UTI due to symptoms and suspicious ua for UTI.  She also presents dehydrated with elevated glucose, BUN, and creatinine.  She denies any nausea, vomiting, or diarrhea.  I will proceed with gentle rehydration due to CHF today in clinic.  She reports that she is not currently taking her lasix  as this time.     Review of Systems  Constitutional:  Positive for malaise/fatigue. Negative for chills, diaphoresis, fever and weight loss.  HENT:  Negative for nosebleeds and sore throat.   Eyes:  Negative for double vision.  Respiratory:  Negative for cough, hemoptysis, sputum production, shortness of breath and wheezing.   Cardiovascular:  Negative for chest pain, palpitations, orthopnea and leg swelling.  Gastrointestinal:  Negative for abdominal pain, blood in stool, constipation, diarrhea,  heartburn, melena, nausea and vomiting.  Genitourinary:  Positive for frequency and urgency. Negative for dysuria, flank pain and hematuria.  Musculoskeletal:  Negative for back pain and joint pain.  Skin: Negative.  Negative for itching and rash.  Neurological:  Negative for dizziness, tingling, focal weakness, weakness and headaches.  Endo/Heme/Allergies:  Does not bruise/bleed easily.  Psychiatric/Behavioral:  Negative for depression. The patient is not nervous/anxious and does not have insomnia.     MEDICAL HISTORY:  Past Medical History:  Diagnosis Date   Acute bilateral thoracic back pain 08/16/2019   Acute post-hemorrhagic anemia 10/10/2023   Allergy     Cipro, Plavix, Statins   Anemia    Arthritis    Atrial fibrillation (HCC)    CAD (coronary artery disease)    a. s/p tandem Promus DES to LAD in 2009 by Dr. Obie;  b.  LHC (03/22/14):  prox LAD 30%, mid LAD 40%, LAD stent ok with dist 20% ISR, apical LAD occluded with L-L collats filling apical vessel (too small for PCI), mid RCA 20%, EF 70%.  Med Rx   [facility])    Chronic kidney disease, stage 3b (HCC)    Colon polyps    Diabetes mellitus    Diagnosed 2012   Diverticulosis of colon (without mention of hemorrhage)    DVT (deep venous thrombosis) (HCC) 12/2022   right leg   GERD (gastroesophageal reflux disease)    Glaucoma    Grade I diastolic dysfunction    Grade I diastolic dysfunction    Herpes zoster without complication 04/24/2020   Hyperlipidemia    intol of statins   Hypertension    Kidney stones    Left atrial dilation    Moderate aortic regurgitation    Motion sickness    back seat cars   NHL (non-Hodgkin's lymphoma) (HCC) dx'd 2003   chemo/xrt comp 2003   Personal history of colonic polyps    Proctitis    Pruritic intertrigo 07/18/2014   Urticaria    Wears dentures    Full upper, partial lower   Wears hearing aid in both ears     SURGICAL HISTORY: Past Surgical History:  Procedure Laterality  Date   BIOPSY  10/15/2023   Procedure: BIOPSY;  Surgeon: Aundria, Ladell POUR, MD;  Location: [facility] ENDOSCOPY;  Service: Gastroenterology;;   cardiac cath-neg     CATARACT EXTRACTION Bilateral    COLONOSCOPY WITH PROPOFOL  N/A 10/15/2023   Procedure: COLONOSCOPY WITH PROPOFOL ;  Surgeon: Toledo, Ladell POUR, MD;  Location: [facility] ENDOSCOPY;  Service: Gastroenterology;  Laterality: N/A;   CORONARY ANGIOPLASTY WITH STENT PLACEMENT     x 2   dexa-neg     ESOPHAGOGASTRODUODENOSCOPY N/A 07/13/2024   Procedure: EGD (ESOPHAGOGASTRODUODENOSCOPY);  Surgeon: Jinny Carmine, MD;  Location: [facility] ENDOSCOPY;  Service: Endoscopy;  Laterality: N/A;   ESOPHAGOGASTRODUODENOSCOPY N/A 07/15/2024   Procedure: EGD (ESOPHAGOGASTRODUODENOSCOPY);  Surgeon: Onita Elspeth Sharper, DO;  Location: [facility] ENDOSCOPY;  Service: Gastroenterology;  Laterality: N/A;   ESOPHAGOGASTRODUODENOSCOPY N/A 08/09/2024   Procedure: EGD (ESOPHAGOGASTRODUODENOSCOPY);  Surgeon: Wilhelmenia Aloha Raddle., MD;  Location: [facility] ENDOSCOPY;  Service: Gastroenterology;  Laterality: N/A;   ESOPHAGOGASTRODUODENOSCOPY N/A 08/30/2024   Procedure: EGD (ESOPHAGOGASTRODUODENOSCOPY);  Surgeon: Wilhelmenia Aloha Raddle., MD;  Location: [facility] ENDOSCOPY;  Service: Gastroenterology;  Laterality: N/A;   ESOPHAGOGASTRODUODENOSCOPY (EGD) WITH PROPOFOL  N/A 10/15/2023   Procedure: ESOPHAGOGASTRODUODENOSCOPY (EGD) WITH PROPOFOL ;  Surgeon: Toledo, Ladell POUR, MD;  Location: [facility] ENDOSCOPY;  Service: Gastroenterology;  Laterality: N/A;   EUS N/A 08/09/2024   Procedure: ULTRASOUND, UPPER GI TRACT, ENDOSCOPIC;  Surgeon: Wilhelmenia Aloha Raddle., MD;  Location: [facility] ENDOSCOPY;  Service: Gastroenterology;  Laterality: N/A;   EUS N/A 08/30/2024   Procedure: ULTRASOUND, UPPER GI TRACT, ENDOSCOPIC;  Surgeon: Wilhelmenia Aloha Raddle., MD;  Location: [facility] ENDOSCOPY;  Service: Gastroenterology;  Laterality: N/A;   FINE NEEDLE ASPIRATION  08/09/2024   Procedure: FINE NEEDLE ASPIRATION;  Surgeon: Wilhelmenia  Aloha Raddle., MD;  Location: [facility] ENDOSCOPY;  Service: Gastroenterology;;   FINE NEEDLE ASPIRATION  08/30/2024   Procedure: FINE NEEDLE ASPIRATION;  Surgeon: Wilhelmenia Aloha Raddle., MD;  Location: [facility] ENDOSCOPY;  Service: Gastroenterology;;   HEMOSTASIS CLIP PLACEMENT  10/15/2023   Procedure: HEMOSTASIS CLIP PLACEMENT;  Surgeon: Aundria, Ladell POUR, MD;  Location: [facility] ENDOSCOPY;  Service: Gastroenterology;;   KNEE SURGERY  10/30/2003   left   LEFT HEART CATHETERIZATION WITH CORONARY ANGIOGRAM N/A 03/22/2014   Procedure: LEFT HEART CATHETERIZATION WITH CORONARY ANGIOGRAM;  Surgeon: Lonni JONETTA Cash, MD;  Location: [facility] CATH LAB;  Service: Cardiovascular;  Laterality: N/A;   POLYPECTOMY  10/15/2023   Procedure: POLYPECTOMY;  Surgeon: Toledo, Ladell POUR, MD;  Location: [facility] ENDOSCOPY;  Service: Gastroenterology;;   stress cardiolite      VAGINAL HYSTERECTOMY     partial, fibroids, one ovary left    SOCIAL HISTORY: Social History   Socioeconomic History   Marital status: Widowed    Spouse name: Not on file   Number of children: 0   Years of education: Not on file   Highest education level: 12th grade  Occupational History   Occupation: Retired    Associate Professor: RETIRED   Occupation: retired  Tobacco Use   Smoking status: Never   Smokeless tobacco: Never  Vaping Use   Vaping status: Never Used  Substance and Sexual Activity   Alcohol use: No   Drug use: No   Sexual activity: Not Currently  Other Topics Concern   Not on file  Social History Narrative   Not on file   Social Drivers of Health   Financial Resource Strain: Low Risk  (09/24/2024)   Overall Financial Resource Strain (CARDIA)    Difficulty of Paying Living Expenses: Not hard at all  Food Insecurity: No Food Insecurity (09/24/2024)   Hunger Vital Sign    Worried About Running Out of Food in the Last Year: Never true    Ran Out of Food in the Last Year: Never true  Transportation Needs: No Transportation Needs  (09/24/2024)   PRAPARE - Administrator, Civil Service (Medical): No    Lack of Transportation (Non-Medical): No  Physical Activity: Inactive (09/24/2024)   Exercise Vital Sign    Days of Exercise per Week: 0 days    Minutes of Exercise per Session: Not on file  Stress: No Stress Concern Present (09/24/2024)   [employer] of Occupational Health - Occupational Stress Questionnaire    Feeling of Stress: Not at all  Social Connections: Moderately Isolated (09/24/2024)   Social Connection and Isolation Panel    Frequency of Communication with Friends and Family: More than three times a week    Frequency of Social Gatherings with Friends and Family: Once a week    Attends Religious Services: More than 4 times per year    Active Member of  Clubs or Organizations: No    Attends Banker Meetings: Not on file    Marital Status: Widowed  Intimate Partner Violence: Not At Risk (07/23/2024)   Humiliation, Afraid, Rape, and Kick questionnaire    Fear of Current or Ex-Partner: No    Emotionally Abused: No    Physically Abused: No    Sexually Abused: No    FAMILY HISTORY: Family History  Problem Relation Age of Onset   Cancer Mother        jaw   Hypertension Father    Heart attack Father    Heart attack Sister    Cancer Sister        unknown primary-mets throughout body   Lung cancer Brother    Colon cancer Neg Hx    Esophageal cancer Neg Hx    Pancreatic cancer Neg Hx    Stomach cancer Neg Hx    Liver disease Neg Hx     ALLERGIES:  is allergic to other, shellfish allergy , venofer  [iron  sucrose], cephalexin , ciprofloxacin, clopidogrel bisulfate, ezetimibe -simvastatin, lidocaine , nitrofurantoin, pregabalin, celecoxib, codeine, codeine phosphate, hydrocod poli-chlorphe poli er, propoxyphene, propoxyphene n-acetaminophen , rofecoxib, sulfa antibiotics, sulfamethoxazole, and sulfonamide derivatives.  MEDICATIONS:  Current Outpatient Medications  Medication Sig  Dispense Refill   Bempedoic Acid -Ezetimibe  (NEXLIZET ) 180-10 MG TABS Take 1 tablet by mouth daily. Pt has scheduled an office visit with provider in April, 2025 90 tablet 2   Blood Glucose Monitoring Suppl DEVI 1 each by Does not apply route in the morning, at noon, and at bedtime. May substitute to any manufacturer covered by patient's insurance. 1 each 0   Cholecalciferol  (VITAMIN D -3) 1000 UNITS CAPS Take 1 capsule by mouth daily.     CRANBERRY PO Take 1 tablet by mouth daily.     diclofenac  Sodium (VOLTAREN ) 1 % GEL Apply 4 g topically 4 (four) times daily. To your chest wall 350 g 1   doxycycline  (VIBRA -TABS) 100 MG tablet Take 1 tablet (100 mg total) by mouth 2 (two) times daily for 5 days. 10 tablet 0   Dulaglutide  (TRULICITY ) 0.75 MG/0.5ML SOAJ Inject 0.75 mg into the skin once a week. for diabetes. 6 mL 1   erythromycin ophthalmic ointment SMARTSIG:In Eye(s)     ezetimibe  (ZETIA ) 10 MG tablet Take 1 tablet (10 mg total) by mouth daily. 90 tablet 1   ferrous sulfate  325 (65 FE) MG tablet Take 1 tablet (325 mg total) by mouth daily with breakfast. 30 tablet 2   fexofenadine  (ALLEGRA ) 180 MG tablet Take 1 tablet (180 mg total) by mouth as needed for allergies or rhinitis. 90 tablet 0   gabapentin  (NEURONTIN ) 100 MG capsule Take 200 mg by mouth 2 (two) times daily.     Glucose Blood (BLOOD GLUCOSE TEST STRIPS) STRP 1 each by In Vitro route in the morning, at noon, and at bedtime. May substitute to any manufacturer covered by patient's insurance. 300 strip 5   imatinib (GLEEVEC) 400 MG tablet Take 1 tablet (400 mg total) by mouth daily. Take with meals and large glass of water. 30 tablet 1   Lancets Misc. MISC 1 each by Does not apply route in the morning, at noon, and at bedtime. May substitute to any manufacturer covered by patient's insurance. 300 each 5   metoprolol  succinate (TOPROL -XL) 100 MG 24 hr tablet TAKE 1 TABLET BY MOUTH EVERY MORNING WITH OR IMMEDIATELY FOLLOWING A MEAL 90 tablet 3    pantoprazole  (PROTONIX ) 40 MG tablet Take 1 tablet (40 mg total)  by mouth 2 (two) times daily. TAKE 1 TABLET BY MOUTH DAILY FOR HEARTBURN (Patient taking differently: Take 80 mg by mouth daily. TAKE 2 TABLET BY MOUTH DAILY FOR HEARTBURN in am) 60 tablet 12   sertraline  (ZOLOFT ) 100 MG tablet TAKE 1 TABLET BY MOUTH DAILY FOR ANXIETY AND DEPRESSION 90 tablet 0   vitamin B-12 (CYANOCOBALAMIN ) 1000 MCG tablet Take 1,000 mcg by mouth every other day.     furosemide  (LASIX ) 20 MG tablet Take 1 tablet by mouth daily. May take additional tablet as needed for swelling (Patient not taking: Reported on 10/01/2024) 180 tablet 3   nitroGLYCERIN  (NITROSTAT ) 0.4 MG SL tablet Place 1 tablet (0.4 mg total) under the tongue every 5 (five) minutes as needed for chest pain. (Patient not taking: Reported on 10/01/2024) 25 tablet 2   ondansetron  (ZOFRAN -ODT) 4 MG disintegrating tablet Take 1 tablet (4 mg total) by mouth every 8 (eight) hours as needed for nausea or vomiting. 90 tablet 0   No current facility-administered medications for this visit.    PHYSICAL EXAMINATION:   Vitals:   10/01/24 1500  BP: 124/74  Pulse: 90  Resp: 20  Temp: 97.9 F (36.6 C)    Filed Weights   10/01/24 1500  Weight: 163 lb 6.4 oz (74.1 kg)   Significant bruising of the right of the face.  Physical Exam Vitals and nursing note reviewed.  HENT:     Head: Normocephalic and atraumatic.     Mouth/Throat:     Pharynx: Oropharynx is clear.  Eyes:     Extraocular Movements: Extraocular movements intact.     Pupils: Pupils are equal, round, and reactive to light.  Cardiovascular:     Rate and Rhythm: Normal rate and regular rhythm.  Pulmonary:     Comments: Decreased breath sounds bilaterally.  Abdominal:     Palpations: Abdomen is soft.  Musculoskeletal:        General: Normal range of motion.     Cervical back: Normal range of motion.  Skin:    General: Skin is warm.  Neurological:     General: No focal deficit  present.     Mental Status: She is alert and oriented to person, place, and time.  Psychiatric:        Behavior: Behavior normal.        Judgment: Judgment normal.     LABORATORY DATA:  I have reviewed the data as listed Lab Results  Component Value Date   WBC 4.9 10/01/2024   HGB 13.9 10/01/2024   HCT 41.0 10/01/2024   MCV 96.0 10/01/2024   PLT 181 10/01/2024   Recent Labs    07/14/24 0539 07/15/24 0513 07/16/24 0434 07/17/24 0540 07/23/24 1345 07/23/24 1610 07/24/24 0636 10/01/24 1430  NA 137 139 141   < > 137 138 140 133*  K 4.1 3.8 4.3   < > 3.9 3.1* 4.8 4.5  CL 104 109 107   < > 104 104 108 96*  CO2 27 23 26    < > 25 24 27 26   GLUCOSE 129* 144* 142*   < > 189* 160* 222* 231*  BUN 38* 22 14   < > 10 10 13  27*  CREATININE 1.31* 1.16* 1.28*   < > 1.14* 1.09* 1.01* 1.58*  CALCIUM 8.2* 8.2* 8.4*   < > 8.6* 8.4* 8.6* 8.5*  GFRNONAA 39* 45* 40*   < > 46* 48* 53* 31*  PROT 5.0* 4.9* 5.2*  --  6.2* 6.6  --   --  ALBUMIN 3.0* 3.0* 3.1*  --  3.5 3.5  --   --   AST 25 24 23   --  20 25  --   --   ALT 11 11 12   --  12 12  --   --   ALKPHOS 21* 23* 25*  --  39 38  --   --   BILITOT 0.8 0.8 0.9  --  0.7 0.9  --   --   BILIDIR 0.1 0.2 0.2  --   --   --   --   --   IBILI 0.7 0.6 0.7  --   --   --   --   --    < > = values in this interval not displayed.  UA results : Amber, hazy, 100 protein, rare bacteria, + wbc  RADIOGRAPHIC STUDIES: I have personally reviewed the radiological images as listed and agreed with the findings in the report. No results found.  ASSESSMENT AND PLAN:  Dehydration: creatinine 1.58/BUN 27, urine with amber color proceed with 500 ml NS via IV over an hour for rehydration  Urinary frequency/urgency:  Likely secondary to uti and hyperglycemia.  UA shows protein, WBC, bacteria started doxycycline  today due to her abx allergies will change if needed when culture results.  Proceed with 500 ml IVF bolus.  Check blood glucose and record at home twice  daily in am before breakfast and before bed.  F/U within a week with PCP  Hyperglycemia:  Check blood glucose and record at home twice daily in am before breakfast and before bed. F/U within a week with PCP to discuss diabetic regimen.  Continue current dose of Trulicity . Encouraged increased hydration at least 8 cups of water per day.  Abstain from sugary drinks and teas and foods.  Hyponatremia:  Likely secondary to hyperglycemia and dehydration.  Proceed with 500 ml NS IVF bolus over an hour.  F/u pcp with in one week.  Return back to clinic in 1 week for repeat labs.  F/U Plan: 500 ML NS IVF bolus today over 1 hour Start Doxycycline  for UTI will change if needed once culture results Monitor and record blood sugars twice daily before breakfast and before bed F/U with pcp within one week F/U with Dr. B with labs next week as scheduled    Above plan of care was discussed with patient/family in detail.  My contact information was given to the patient/family.     Morna Husband, NP 10/01/2024 4:39 PM

## 2024-10-01 NOTE — Telephone Encounter (Signed)
 Spoke with patient. She reports urinary frequency. Not improving. Pt would like an apt today at 230pm. This is the latest she can come due to transportation. Spoke with Morna, NP, who agreed to see patient. V/o to add labs- cbc, metb, u/a and culture and hold tube.

## 2024-10-01 NOTE — Addendum Note (Signed)
 Addended by: BULA POWELL PARAS on: 10/01/2024 11:14 AM   Modules accepted: Orders

## 2024-10-04 ENCOUNTER — Telehealth: Payer: Self-pay | Admitting: Internal Medicine

## 2024-10-04 DIAGNOSIS — H401133 Primary open-angle glaucoma, bilateral, severe stage: Secondary | ICD-10-CM | POA: Diagnosis not present

## 2024-10-04 DIAGNOSIS — H16001 Unspecified corneal ulcer, right eye: Secondary | ICD-10-CM | POA: Diagnosis not present

## 2024-10-04 DIAGNOSIS — E1122 Type 2 diabetes mellitus with diabetic chronic kidney disease: Secondary | ICD-10-CM

## 2024-10-04 NOTE — Telephone Encounter (Signed)
 Patient is interestingly negative for c-kit and also PDGF RA mutation on the molecular panel. NF1 negative on the [facility] report   Recommend checking for-succinate dehydrogenase IHC.  Message sent to [facility]

## 2024-10-05 LAB — URINE CULTURE

## 2024-10-05 NOTE — Telephone Encounter (Addendum)
 Succinate dehydrogenase IHC requested on specimen [id number], gastric mass, collected 08/30/2024. Block is still at [facility] and is being sent back yo [facility] pathology. The testing will take place in [place] .

## 2024-10-06 ENCOUNTER — Other Ambulatory Visit: Payer: Self-pay | Admitting: Cardiovascular Disease

## 2024-10-06 ENCOUNTER — Other Ambulatory Visit: Payer: Self-pay | Admitting: Primary Care

## 2024-10-06 DIAGNOSIS — Z6379 Other stressful life events affecting family and household: Secondary | ICD-10-CM

## 2024-10-08 ENCOUNTER — Telehealth: Payer: Self-pay | Admitting: Pharmacist

## 2024-10-08 NOTE — Telephone Encounter (Signed)
 Patient is having seasonal allergies and called the office to see if she could take Zyrtec.  There is no drug interaction between Zyrtec and imatinib.  Patient advised to take Zyrtec if needed

## 2024-10-09 ENCOUNTER — Encounter: Payer: Self-pay | Admitting: Internal Medicine

## 2024-10-09 ENCOUNTER — Inpatient Hospital Stay (HOSPITAL_BASED_OUTPATIENT_CLINIC_OR_DEPARTMENT_OTHER): Admitting: Internal Medicine

## 2024-10-09 ENCOUNTER — Other Ambulatory Visit: Payer: Self-pay | Admitting: Pharmacist

## 2024-10-09 ENCOUNTER — Inpatient Hospital Stay

## 2024-10-09 VITALS — BP 143/96 | HR 94 | Temp 96.7°F | Resp 20 | Ht 69.0 in | Wt 159.6 lb

## 2024-10-09 DIAGNOSIS — M7989 Other specified soft tissue disorders: Secondary | ICD-10-CM | POA: Diagnosis not present

## 2024-10-09 DIAGNOSIS — C49A2 Gastrointestinal stromal tumor of stomach: Secondary | ICD-10-CM | POA: Diagnosis not present

## 2024-10-09 DIAGNOSIS — R944 Abnormal results of kidney function studies: Secondary | ICD-10-CM | POA: Diagnosis not present

## 2024-10-09 DIAGNOSIS — E871 Hypo-osmolality and hyponatremia: Secondary | ICD-10-CM | POA: Diagnosis not present

## 2024-10-09 DIAGNOSIS — D509 Iron deficiency anemia, unspecified: Secondary | ICD-10-CM | POA: Diagnosis not present

## 2024-10-09 DIAGNOSIS — R7989 Other specified abnormal findings of blood chemistry: Secondary | ICD-10-CM | POA: Diagnosis not present

## 2024-10-09 DIAGNOSIS — I4891 Unspecified atrial fibrillation: Secondary | ICD-10-CM | POA: Diagnosis not present

## 2024-10-09 DIAGNOSIS — R911 Solitary pulmonary nodule: Secondary | ICD-10-CM | POA: Diagnosis not present

## 2024-10-09 LAB — CMP (CANCER CENTER ONLY)
ALT: 16 U/L (ref 0–44)
AST: 29 U/L (ref 15–41)
Albumin: 3.7 g/dL (ref 3.5–5.0)
Alkaline Phosphatase: 36 U/L — ABNORMAL LOW (ref 38–126)
Anion gap: 7 (ref 5–15)
BUN: 26 mg/dL — ABNORMAL HIGH (ref 8–23)
CO2: 27 mmol/L (ref 22–32)
Calcium: 8.7 mg/dL — ABNORMAL LOW (ref 8.9–10.3)
Chloride: 104 mmol/L (ref 98–111)
Creatinine: 1.33 mg/dL — ABNORMAL HIGH (ref 0.44–1.00)
GFR, Estimated: 38 mL/min — ABNORMAL LOW (ref >60)
Glucose, Bld: 156 mg/dL — ABNORMAL HIGH (ref 70–99)
Potassium: 4.9 mmol/L (ref 3.5–5.1)
Sodium: 138 mmol/L (ref 135–145)
Total Bilirubin: 0.8 mg/dL (ref 0.0–1.2)
Total Protein: 6.1 g/dL — ABNORMAL LOW (ref 6.5–8.1)

## 2024-10-09 LAB — CBC WITH DIFFERENTIAL (CANCER CENTER ONLY)
Abs Immature Granulocytes: 0.04 K/uL (ref 0.00–0.07)
Basophils Absolute: 0 K/uL (ref 0.0–0.1)
Basophils Relative: 0 %
Eosinophils Absolute: 0 K/uL (ref 0.0–0.5)
Eosinophils Relative: 1 %
HCT: 38.9 % (ref 36.0–46.0)
Hemoglobin: 13 g/dL (ref 12.0–15.0)
Immature Granulocytes: 1 %
Lymphocytes Relative: 33 %
Lymphs Abs: 1.6 K/uL (ref 0.7–4.0)
MCH: 32.2 pg (ref 26.0–34.0)
MCHC: 33.4 g/dL (ref 30.0–36.0)
MCV: 96.3 fL (ref 80.0–100.0)
Monocytes Absolute: 0.4 K/uL (ref 0.1–1.0)
Monocytes Relative: 7 %
Neutro Abs: 2.9 K/uL (ref 1.7–7.7)
Neutrophils Relative %: 58 %
Platelet Count: 143 K/uL — ABNORMAL LOW (ref 150–400)
RBC: 4.04 MIL/uL (ref 3.87–5.11)
RDW: 12.9 % (ref 11.5–15.5)
WBC Count: 4.9 K/uL (ref 4.0–10.5)
nRBC: 0 % (ref 0.0–0.2)

## 2024-10-09 LAB — SAMPLE TO BLOOD BANK

## 2024-10-09 NOTE — Progress Notes (Signed)
 C/O HANDS ITCHING X2 WEEKS. FEET AND ANKLES NOT AS SWOLLEN AS BEFORE.  PT HAS DENIAL LETTER FROM [facility].

## 2024-10-09 NOTE — Progress Notes (Signed)
 MD stopping imatinib, patient disenrolled

## 2024-10-09 NOTE — Progress Notes (Signed)
 [facility] CONSULT NOTE  Patient Care Team: Gretta Comer POUR, NP as PCP - General (Internal Medicine) Perla Evalene PARAS, MD as PCP - Cardiology (Cardiology) Alline Lenis, MD (Inactive) as Consulting Physician (Urology) Portia Fireman, OD as Consulting Physician (Optometry) Homsher, Wanda, RN as [facility] Management Rennie Cindy SAUNDERS, MD as Consulting Physician (Oncology) Maurie Rayfield BIRCH, RN as Oncology Nurse Navigator  CHIEF COMPLAINTS/PURPOSE OF CONSULTATION: GIST   Oncology History Overview Note  Gastric tumor- AUG 2025- CT A/P: Large mass within the lumen of the stomach-  Large round well-circumscribed mass in the mid stomach measures 6.3 x 6.3 cm (image 63) mass is contained within lumen of the stomach.. No evidence of metastatic adenopathy in the abdomen pelvis. No liver metastasis. No peritoneal metastasis. CT chest:  Solitary pulmonary nodule in the RIGHT lower lobe is new from 2014. Indeterminate finding. FDG PET scan may aid in determining malignant potential. 2. No mediastinal adenopathy.  PET scan:  6.6 cm gastric mass has diffuse low-grade metabolic activity, 1.0 cm left lower lobe pulmonary nodule has a maximum SUV of 2.5, borderline for malignancy. This nodule was not present on 08/09/2013.   # AUG 2025EGD biops-y negative/inconclusive.  SEP 2025- S/p endoscopic ultrasound with biopsy [Dr.Mansouraty, [place]]- positive for spindle cell neoplasm.  EUS-suggestive of subepithelial/from the muscularis.  however, given the a cellularity-unable to perform IHC stains-differential diagnosis includes GIST tumor/leiomyoma vs others.  Ordered-liquid biopsy-NEG  # OCT 2025- GIST- however- ckit & PDGFRA- NEG; ATYPICAL GIST. On GLEEVEC 400 mg x1 month- STOPPED AFTER NGS Results.   #     # NHL/of the bowel - [[facility]]treated >25  y ago- RT.  Chemo x 6- followed by RT- No surgery.    Malignant spindle cell neoplasm (HCC)  08/16/2024 Initial Diagnosis   Malignant spindle  cell neoplasm (HCC)     HISTORY OF PRESENTING ILLNESS:   Accompanied by family.  Walking with a rolling walker  [name] [name] 89 y.o.  female patient with a medical history significant of  persistent Atrial fibrillation-currently off Eliquis ,  stomach GIST; CKD IIIa-iron  deficient anemia  on Gleevec is here for a follow up.   Discussed the use of AI scribe software for clinical note transcription with the patient, who gave verbal consent to proceed.  History of Present Illness   [name] [name] is a 89 year old female with gastrointestinal stromal tumor (GIST) on Gleevec 400 mg a day who presents for follow-up. She is staying with her niece.   She experiences nausea without vomiting, with a notable episode on Saturday. She felt fatigued the following day. Diarrhea occurred a couple of times and was managed with Imodium.  She feels easily fatigued, requiring rest after activities such as sweeping or washing dishes.  She has noticed swelling in her feet, which she manages with compression stockings and by keeping her feet elevated. The stockings are difficult to put on.  She has dry, itchy skin on her hands and uses a moisturizer. She has experienced weight loss, noting a decrease from 167 to 159 pounds over a month. She received fluids during a recent visit due to dehydration and finds plain water unpalatable.  She has a swelling around the eye. She is on Trulicity  and is off glipizide  as per her family doctor's advice.      Review of Systems  Constitutional:  Positive for malaise/fatigue. Negative for chills, diaphoresis, fever and weight loss.  HENT:  Negative for nosebleeds and sore throat.  Eyes:  Negative for double vision.  Respiratory:  Negative for cough, hemoptysis, sputum production, shortness of breath and wheezing.   Cardiovascular:  Negative for chest pain, palpitations, orthopnea and leg swelling.  Gastrointestinal:  Negative for abdominal pain, blood in stool, constipation,  diarrhea, heartburn, melena, nausea and vomiting.  Genitourinary:  Negative for dysuria, frequency and urgency.  Musculoskeletal:  Negative for back pain and joint pain.  Skin: Negative.  Negative for itching and rash.  Neurological:  Negative for dizziness, tingling, focal weakness, weakness and headaches.  Endo/Heme/Allergies:  Does not bruise/bleed easily.  Psychiatric/Behavioral:  Negative for depression. The patient is not nervous/anxious and does not have insomnia.     MEDICAL HISTORY:  Past Medical History:  Diagnosis Date   Acute bilateral thoracic back pain 08/16/2019   Acute post-hemorrhagic anemia 10/10/2023   Allergy     Cipro, Plavix, Statins   Anemia    Arthritis    Atrial fibrillation (HCC)    CAD (coronary artery disease)    a. s/p tandem Promus DES to LAD in 2009 by Dr. Obie;  b.  LHC (03/22/14):  prox LAD 30%, mid LAD 40%, LAD stent ok with dist 20% ISR, apical LAD occluded with L-L collats filling apical vessel (too small for PCI), mid RCA 20%, EF 70%.  Med Rx   Cancer [facility])    Chronic kidney disease, stage 3b (HCC)    Colon polyps    Diabetes mellitus    Diagnosed 2012   Diverticulosis of colon (without mention of hemorrhage)    DVT (deep venous thrombosis) (HCC) 12/2022   right leg   GERD (gastroesophageal reflux disease)    Glaucoma    Grade I diastolic dysfunction    Grade I diastolic dysfunction    Herpes zoster without complication 04/24/2020   Hyperlipidemia    intol of statins   Hypertension    Kidney stones    Left atrial dilation    Moderate aortic regurgitation    Motion sickness    back seat cars   NHL (non-Hodgkin's lymphoma) (HCC) dx'd 2003   chemo/xrt comp 2003   Personal history of colonic polyps    Proctitis    Pruritic intertrigo 07/18/2014   Urticaria    Wears dentures    Full upper, partial lower   Wears hearing aid in both ears     SURGICAL HISTORY: Past Surgical History:  Procedure Laterality Date   BIOPSY  10/15/2023    Procedure: BIOPSY;  Surgeon: Aundria, Ladell POUR, MD;  Location: [facility] ENDOSCOPY;  Service: Gastroenterology;;   cardiac cath-neg     CATARACT EXTRACTION Bilateral    COLONOSCOPY WITH PROPOFOL  N/A 10/15/2023   Procedure: COLONOSCOPY WITH PROPOFOL ;  Surgeon: Toledo, Ladell POUR, MD;  Location: [facility] ENDOSCOPY;  Service: Gastroenterology;  Laterality: N/A;   CORONARY ANGIOPLASTY WITH STENT PLACEMENT     x 2   dexa-neg     ESOPHAGOGASTRODUODENOSCOPY N/A 07/13/2024   Procedure: EGD (ESOPHAGOGASTRODUODENOSCOPY);  Surgeon: Jinny Carmine, MD;  Location: [facility] ENDOSCOPY;  Service: Endoscopy;  Laterality: N/A;   ESOPHAGOGASTRODUODENOSCOPY N/A 07/15/2024   Procedure: EGD (ESOPHAGOGASTRODUODENOSCOPY);  Surgeon: Onita Elspeth Sharper, DO;  Location: [facility] ENDOSCOPY;  Service: Gastroenterology;  Laterality: N/A;   ESOPHAGOGASTRODUODENOSCOPY N/A 08/09/2024   Procedure: EGD (ESOPHAGOGASTRODUODENOSCOPY);  Surgeon: Wilhelmenia Aloha Raddle., MD;  Location: [facility] ENDOSCOPY;  Service: Gastroenterology;  Laterality: N/A;   ESOPHAGOGASTRODUODENOSCOPY N/A 08/30/2024   Procedure: EGD (ESOPHAGOGASTRODUODENOSCOPY);  Surgeon: Wilhelmenia Aloha Raddle., MD;  Location: [facility] ENDOSCOPY;  Service: Gastroenterology;  Laterality: N/A;  ESOPHAGOGASTRODUODENOSCOPY (EGD) WITH PROPOFOL  N/A 10/15/2023   Procedure: ESOPHAGOGASTRODUODENOSCOPY (EGD) WITH PROPOFOL ;  Surgeon: Toledo, Ladell POUR, MD;  Location: [facility] ENDOSCOPY;  Service: Gastroenterology;  Laterality: N/A;   EUS N/A 08/09/2024   Procedure: ULTRASOUND, UPPER GI TRACT, ENDOSCOPIC;  Surgeon: Wilhelmenia Aloha Raddle., MD;  Location: [facility] ENDOSCOPY;  Service: Gastroenterology;  Laterality: N/A;   EUS N/A 08/30/2024   Procedure: ULTRASOUND, UPPER GI TRACT, ENDOSCOPIC;  Surgeon: Wilhelmenia Aloha Raddle., MD;  Location: [facility] ENDOSCOPY;  Service: Gastroenterology;  Laterality: N/A;   FINE NEEDLE ASPIRATION  08/09/2024   Procedure: FINE NEEDLE ASPIRATION;  Surgeon: Wilhelmenia Aloha Raddle., MD;  Location: [facility]  ENDOSCOPY;  Service: Gastroenterology;;   FINE NEEDLE ASPIRATION  08/30/2024   Procedure: FINE NEEDLE ASPIRATION;  Surgeon: Wilhelmenia Aloha Raddle., MD;  Location: [facility] ENDOSCOPY;  Service: Gastroenterology;;   HEMOSTASIS CLIP PLACEMENT  10/15/2023   Procedure: HEMOSTASIS CLIP PLACEMENT;  Surgeon: Aundria, Ladell POUR, MD;  Location: [facility] ENDOSCOPY;  Service: Gastroenterology;;   KNEE SURGERY  10/30/2003   left   LEFT HEART CATHETERIZATION WITH CORONARY ANGIOGRAM N/A 03/22/2014   Procedure: LEFT HEART CATHETERIZATION WITH CORONARY ANGIOGRAM;  Surgeon: Lonni JONETTA Cash, MD;  Location: [facility] CATH LAB;  Service: Cardiovascular;  Laterality: N/A;   POLYPECTOMY  10/15/2023   Procedure: POLYPECTOMY;  Surgeon: Toledo, Ladell POUR, MD;  Location: [facility] ENDOSCOPY;  Service: Gastroenterology;;   stress cardiolite      VAGINAL HYSTERECTOMY     partial, fibroids, one ovary left    SOCIAL HISTORY: Social History   Socioeconomic History   Marital status: Widowed    Spouse name: Not on file   Number of children: 0   Years of education: Not on file   Highest education level: 12th grade  Occupational History   Occupation: Retired    Associate Professor: RETIRED   Occupation: retired  Tobacco Use   Smoking status: Never   Smokeless tobacco: Never  Vaping Use   Vaping status: Never Used  Substance and Sexual Activity   Alcohol use: No   Drug use: No   Sexual activity: Not Currently  Other Topics Concern   Not on file  Social History Narrative   Not on file   Social Drivers of Health   Financial Resource Strain: Low Risk  (09/24/2024)   Overall Financial Resource Strain (CARDIA)    Difficulty of Paying Living Expenses: Not hard at all  Food Insecurity: No Food Insecurity (09/24/2024)   Hunger Vital Sign    Worried About Running Out of Food in the Last Year: Never true    Ran Out of Food in the Last Year: Never true  Transportation Needs: No Transportation Needs (09/24/2024)   PRAPARE - Therapist, Art (Medical): No    Lack of Transportation (Non-Medical): No  Physical Activity: Inactive (09/24/2024)   Exercise Vital Sign    Days of Exercise per Week: 0 days    Minutes of Exercise per Session: Not on file  Stress: No Stress Concern Present (09/24/2024)   [employer] of Occupational Health - Occupational Stress Questionnaire    Feeling of Stress: Not at all  Social Connections: Moderately Isolated (09/24/2024)   Social Connection and Isolation Panel    Frequency of Communication with Friends and Family: More than three times a week    Frequency of Social Gatherings with Friends and Family: Once a week    Attends Religious Services: More than 4 times per year    Active Member of Clubs or Organizations: No  Attends Banker Meetings: Not on file    Marital Status: Widowed  Intimate Partner Violence: Not At Risk (07/23/2024)   Humiliation, Afraid, Rape, and Kick questionnaire    Fear of Current or Ex-Partner: No    Emotionally Abused: No    Physically Abused: No    Sexually Abused: No    FAMILY HISTORY: Family History  Problem Relation Age of Onset   Cancer Mother        jaw   Hypertension Father    Heart attack Father    Heart attack Sister    Cancer Sister        unknown primary-mets throughout body   Lung cancer Brother    Colon cancer Neg Hx    Esophageal cancer Neg Hx    Pancreatic cancer Neg Hx    Stomach cancer Neg Hx    Liver disease Neg Hx     ALLERGIES:  is allergic to other, shellfish allergy , venofer  [iron  sucrose], cephalexin , ciprofloxacin, clopidogrel bisulfate, ezetimibe -simvastatin, lidocaine , nitrofurantoin, pregabalin, celecoxib, codeine, codeine phosphate, hydrocod poli-chlorphe poli er, propoxyphene, propoxyphene n-acetaminophen , rofecoxib, sulfa antibiotics, sulfamethoxazole, and sulfonamide derivatives.  MEDICATIONS:  Current Outpatient Medications  Medication Sig Dispense Refill   Bempedoic  Acid-Ezetimibe  (NEXLIZET ) 180-10 MG TABS TAKE 1 TABLET BY MOUTH DAILY 90 tablet 3   Blood Glucose Monitoring Suppl DEVI 1 each by Does not apply route in the morning, at noon, and at bedtime. May substitute to any manufacturer covered by patient's insurance. 1 each 0   Cholecalciferol  (VITAMIN D -3) 1000 UNITS CAPS Take 1 capsule by mouth daily.     CRANBERRY PO Take 1 tablet by mouth daily.     diclofenac  Sodium (VOLTAREN ) 1 % GEL Apply 4 g topically 4 (four) times daily. To your chest wall 350 g 1   Dulaglutide  (TRULICITY ) 0.75 MG/0.5ML SOAJ Inject 0.75 mg into the skin once a week. for diabetes. 6 mL 1   erythromycin ophthalmic ointment SMARTSIG:In Eye(s)     ezetimibe  (ZETIA ) 10 MG tablet Take 1 tablet (10 mg total) by mouth daily. 90 tablet 1   ferrous sulfate  325 (65 FE) MG tablet Take 1 tablet (325 mg total) by mouth daily with breakfast. 30 tablet 2   fexofenadine  (ALLEGRA ) 180 MG tablet Take 1 tablet (180 mg total) by mouth as needed for allergies or rhinitis. 90 tablet 0   gabapentin  (NEURONTIN ) 100 MG capsule Take 200 mg by mouth 2 (two) times daily.     Glucose Blood (BLOOD GLUCOSE TEST STRIPS) STRP 1 each by In Vitro route in the morning, at noon, and at bedtime. May substitute to any manufacturer covered by patient's insurance. 300 strip 5   Lancets Misc. MISC 1 each by Does not apply route in the morning, at noon, and at bedtime. May substitute to any manufacturer covered by patient's insurance. 300 each 5   metoprolol  succinate (TOPROL -XL) 100 MG 24 hr tablet TAKE 1 TABLET BY MOUTH EVERY MORNING WITH OR IMMEDIATELY FOLLOWING A MEAL 90 tablet 3   nitroGLYCERIN  (NITROSTAT ) 0.4 MG SL tablet Place 1 tablet (0.4 mg total) under the tongue every 5 (five) minutes as needed for chest pain. 25 tablet 2   ondansetron  (ZOFRAN -ODT) 4 MG disintegrating tablet Take 1 tablet (4 mg total) by mouth every 8 (eight) hours as needed for nausea or vomiting. 90 tablet 0   pantoprazole  (PROTONIX ) 40 MG  tablet Take 1 tablet (40 mg total) by mouth 2 (two) times daily. TAKE 1 TABLET BY MOUTH DAILY  FOR HEARTBURN (Patient taking differently: Take 80 mg by mouth daily. TAKE 2 TABLET BY MOUTH DAILY FOR HEARTBURN in am) 60 tablet 12   sertraline  (ZOLOFT ) 100 MG tablet TAKE 1 TABLET BY MOUTH DAILY FOR ANXIETY AND DEPRESSION 90 tablet 0   vitamin B-12 (CYANOCOBALAMIN ) 1000 MCG tablet Take 1,000 mcg by mouth every other day.     imatinib (GLEEVEC) 400 MG tablet Take 1 tablet (400 mg total) by mouth daily. Take with meals and large glass of water. 30 tablet 1   No current facility-administered medications for this visit.    PHYSICAL EXAMINATION:   Vitals:   10/09/24 0857 10/09/24 0919  BP: (!) 145/102 (!) 143/96  Pulse: 94   Resp: 20   Temp: (!) 96.7 F (35.9 C)   SpO2: 99%      Filed Weights   10/09/24 0857  Weight: 159 lb 9.6 oz (72.4 kg)    Significant bruising of the right of the face.  Physical Exam Vitals and nursing note reviewed.  HENT:     Head: Normocephalic and atraumatic.     Mouth/Throat:     Pharynx: Oropharynx is clear.  Eyes:     Extraocular Movements: Extraocular movements intact.     Pupils: Pupils are equal, round, and reactive to light.  Cardiovascular:     Rate and Rhythm: Normal rate and regular rhythm.  Pulmonary:     Comments: Decreased breath sounds bilaterally.  Abdominal:     Palpations: Abdomen is soft.  Musculoskeletal:        General: Normal range of motion.     Cervical back: Normal range of motion.  Skin:    General: Skin is warm.  Neurological:     General: No focal deficit present.     Mental Status: She is alert and oriented to person, place, and time.  Psychiatric:        Behavior: Behavior normal.        Judgment: Judgment normal.     LABORATORY DATA:  I have reviewed the data as listed Lab Results  Component Value Date   WBC 4.9 10/09/2024   HGB 13.0 10/09/2024   HCT 38.9 10/09/2024   MCV 96.3 10/09/2024   PLT 143 (L)  10/09/2024   Recent Labs    07/14/24 0539 07/15/24 0513 07/16/24 0434 07/17/24 0540 07/23/24 1345 07/23/24 1610 07/24/24 0636 10/01/24 1430 10/09/24 0851  NA 137 139 141   < > 137 138 140 133* 138  K 4.1 3.8 4.3   < > 3.9 3.1* 4.8 4.5 4.9  CL 104 109 107   < > 104 104 108 96* 104  CO2 27 23 26    < > 25 24 27 26 27   GLUCOSE 129* 144* 142*   < > 189* 160* 222* 231* 156*  BUN 38* 22 14   < > 10 10 13  27* 26*  CREATININE 1.31* 1.16* 1.28*   < > 1.14* 1.09* 1.01* 1.58* 1.33*  CALCIUM 8.2* 8.2* 8.4*   < > 8.6* 8.4* 8.6* 8.5* 8.7*  GFRNONAA 39* 45* 40*   < > 46* 48* 53* 31* 38*  PROT 5.0* 4.9* 5.2*  --  6.2* 6.6  --   --  6.1*  ALBUMIN 3.0* 3.0* 3.1*  --  3.5 3.5  --   --  3.7  AST 25 24 23   --  20 25  --   --  29  ALT 11 11 12   --  12 12  --   --  16  ALKPHOS 21* 23* 25*  --  39 38  --   --  36*  BILITOT 0.8 0.8 0.9  --  0.7 0.9  --   --  0.8  BILIDIR 0.1 0.2 0.2  --   --   --   --   --   --   IBILI 0.7 0.6 0.7  --   --   --   --   --   --    < > = values in this interval not displayed.    RADIOGRAPHIC STUDIES: I have personally reviewed the radiological images as listed and agreed with the findings in the report. No results found.    Gastrointestinal stromal tumor (GIST) of body of stomach (HCC) # OCT 2025- Spindle cell neoplasm consistent with gastrointestinal stromal tumor (GIST), spindle cell type AUG 2025- CT A/P: Large mass within the lumen of the stomach-  Large round well-circumscribed mass in the mid stomach measures 6.3 x 6.3 cm (image 63) mass is contained within lumen of the stomach.. No evidence of metastatic adenopathy in the abdomen pelvis. No liver metastasis. No peritoneal metastasis. CT chest:  Solitary pulmonary nodule in the RIGHT lower lobe is new from 2014. Indeterminate finding. FDG PET scan may aid in determining malignant potential. 2. No mediastinal adenopathy.  PET scan:  6.6 cm gastric mass has diffuse low-grade metabolic activity, 1.0 cm left lower lobe  pulmonary nodule has a maximum SUV of 2.5, borderline for malignancy. This nodule was not present on 08/09/2013.   # AUG 2025 - EGD biopsy negative/inconclusive.  SEP 2025- S/p endoscopic ultrasound with biopsy x 2 [Dr.Mansouraty, [place]]- positive for spindle cell neoplasm.  EUS-POSITIVE FOR GIST.  STAGE IB [although-left lower lobe lung nodule unclear etiology]. NGS Tempus- NEG C-kit or PDGFR mutation.  Succinate dehydrogenase IHC pending.   # Of note patient has been on Gleevec 400 mg a day pending above NGS testing.  Patient is tolerating Gleevec poorly with-facial edema; worsening fatigue; leg swelling dry skin excetra.  And the fact that this is negative for c-kit-PDGFR I think is reasonable to discontinue Gleevec.  # Will consider alternative tyrosine kinase inhibitor-like Sutent.  However response rates are much modest compared to Gleevec with typical GIST tumor.   # Severe anemia-second iron  deficiency/c acute on hronic GI bleed secondary to #1-today hemoglobin is 12. -iron  infusion discontinued because of grade 4 anaphylactic reaction.  Continue oral iron  once a day.  Today hemoglobin is 13 monitor for now.   # History of A-fib - OFF Eliquis -[Dr.Gollan]- s/p cardiology- consider Watchman device- defer to cardiology.     # Anaphylaxis with Venofer -   # DISPOSITION: # No blood- cancel tomorrow appt # follow up in next 4  weeks- MD; labs- cbc-CMP; HOLD tube- D-2 possible 1 unit- Blood- Dr.B   Above plan of care was discussed with patient/family in detail.  My contact information was given to the patient/family.     Cindy JONELLE Joe, MD 10/09/2024 8:20 PM

## 2024-10-09 NOTE — Assessment & Plan Note (Addendum)
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 No evidence of metastatic adenopathy in the abdomen pelvis. No liver metastasis. No peritoneal metastasis. CT chest:  Solitary pulmonary nodule in the RIGHT lower lobe is new from 2014. Indeterminate finding. FDG PET scan may aid in determining malignant potential. 2. No mediastinal adenopathy.  PET scan:  6.6 cm gastric mass has diffuse low-grade metabolic activity, 1.0 cm left lower lobe pulmonary nodule has a maximum SUV of 2.5, borderline for malignancy. This nodule was not present on 08/09/2013.   # AUG 2025 - EGD biopsy negative/inconclusive.  SEP 2025- S/p endoscopic ultrasound with biopsy x 2 [Dr.Mansouraty, [place]]- positive for spindle cell neoplasm.  EUS-POSITIVE FOR GIST.  STAGE IB [although-left lower lobe lung nodule unclear etiology]. NGS Tempus- NEG C-kit or PDGFR mutation.  Succinate dehydrogenase IHC pending.   # Of note patient has been on Gleevec 400 mg a day pending above NGS testing.  Patient is tolerating Gleevec poorly with-facial edema; worsening fatigue; leg swelling dry skin excetra.  And the fact that this is negative for c-kit-PDGFR I think is reasonable to discontinue Gleevec.  # Will consider alternative tyrosine kinase inhibitor-like Sutent.  However response rates are much modest compared to Gleevec with typical GIST tumor.   # Severe anemia-second iron  deficiency/c acute on hronic GI bleed secondary to #1-today hemoglobin is 12. -iron  infusion discontinued because of grade 4 anaphylactic reaction.  Continue oral iron  once a day.  Today hemoglobin is 13 monitor for now.   # History of A-fib - OFF Eliquis -[Dr.Gollan]- s/p cardiology- consider Watchman device- defer to  cardiology.     # Anaphylaxis with Venofer -   # DISPOSITION: # No blood- cancel tomorrow appt # follow up in next 4  weeks- MD; labs- cbc-CMP; HOLD tube- D-2 possible 1 unit- Blood- Dr.B

## 2024-10-10 ENCOUNTER — Inpatient Hospital Stay

## 2024-10-12 ENCOUNTER — Telehealth: Payer: Self-pay | Admitting: Internal Medicine

## 2024-10-12 MED ORDER — GLIPIZIDE ER 2.5 MG PO TB24: 2.5000 mg | ORAL_TABLET | Freq: Every day | ORAL | 0 refills | Status: DC

## 2024-10-12 NOTE — Telephone Encounter (Signed)
 Spoke to pt/[name] [name] niece - recommend surgery-Gleevec likely ineffective as C-kit PDGF or negative.   Patient will discuss with rest of the family and let us  know-options include [facility].   GB

## 2024-10-12 NOTE — Telephone Encounter (Signed)
 LVM for pt to call to discuss   Called niece, Burnard- unavailable- LVM to call us  back-   Kristie-please reach out to patient/niece.  Given she is negative for c-kit and PDGF for-not much response rate from imatinib and discontinued in/also underlying side effects.  Other medications like sunitinib is an option-however less response rates   # Recommend surgical evaluation either at [facility] or [place]-as per the preference.  I will be happy to reiterate the same.  Thank you, GB

## 2024-10-15 ENCOUNTER — Encounter: Payer: Self-pay | Admitting: Internal Medicine

## 2024-10-15 NOTE — Telephone Encounter (Signed)
 Referral placed to [facility]

## 2024-10-17 ENCOUNTER — Ambulatory Visit: Admitting: Primary Care

## 2024-10-17 VITALS — BP 130/80 | HR 102 | Temp 98.4°F | Ht 69.0 in | Wt 159.2 lb

## 2024-10-17 DIAGNOSIS — R051 Acute cough: Secondary | ICD-10-CM

## 2024-10-17 DIAGNOSIS — N3281 Overactive bladder: Secondary | ICD-10-CM | POA: Diagnosis not present

## 2024-10-17 LAB — POCT INFLUENZA A/B
Influenza A, POC: NEGATIVE
Influenza B, POC: NEGATIVE

## 2024-10-17 LAB — POC COVID19 BINAXNOW: SARS Coronavirus 2 Ag: NEGATIVE

## 2024-10-17 MED ORDER — OXYBUTYNIN CHLORIDE ER 5 MG PO TB24: 5.0000 mg | ORAL_TABLET | Freq: Every day | ORAL | 0 refills | Status: AC

## 2024-10-17 NOTE — Patient Instructions (Signed)
 Continue Tylenol  and Delsym as needed.  Please notify us  if your symptoms have not improved by next Tuesday or sooner if you notice fevers consistently above 102 with shortness of breath.  It was a pleasure to see you today!

## 2024-10-17 NOTE — Assessment & Plan Note (Signed)
 Discussed options for treatment.  She elects medication.  Start oxybutynin  XL 5 mg daily at bedtime. We discussed potential side effects and precaution regarding dehydration.

## 2024-10-17 NOTE — Assessment & Plan Note (Signed)
 Suspect viral etiology given HPI and exam today.  Respiratory exam reassuring. Reviewed labs from last week which appear stable.  Negative rapid flu and Covid-19 tests today.  Discussed to continue Tylenol , Delsym PRN. We also discussed that symptoms may become worse before improved.  Her family will closely monitor and report if fevers return and increase >102. Work on hydration.   Return precautions provided.

## 2024-10-17 NOTE — Progress Notes (Signed)
 Subjective:    Patient ID: [name] [name], female    DOB: Dec 13, 1931, 89 y.o.   MRN: [id number]  KOREY ARROYO is a very pleasant 89 y.o. female with a history of hypertension, atrial fibrillation, CAD, CHF, GIST of stomach, type 2 diabetes, recurrent UTI, CKD, chronic fatigue, anxiety depression, iron  deficiency anemia who presents today to discuss URI symptoms. She would also like treatment for overactive bladder.   Symptom onset last night with rhinorrhea and cough. She then developed dry mouth. This morning she had a fever of 100.4 so she took Tylenol . She bought Delsym but has not taken as her cough is mild to non existent. No fevers today.   Today she has lightheadedness anytime she stands, also with shortness of breath, feels like she could pass out. She denies syncope. Her niece, nephew, and great niece have had similar symptoms. All have recovered.   She completed lab work last week, CBC grossly normal except for mildly low platelet count.   She has a chronic history of overactive bladder.  She will get up every hour on the air during the nighttime to urinate.  She has no difficulty during the day.  She denies liquids prior to bed, she denies excessive caffeine consumption.  She was initiated on oxybutynin  XL 5 mg in 2023 but does not recall labs this was discontinued.  She would like to try something for treatment.  Review of Systems  Constitutional:  Positive for fatigue and fever.  Respiratory:  Positive for cough and shortness of breath.   Neurological:  Positive for light-headedness.         Past Medical History:  Diagnosis Date   Acute bilateral thoracic back pain 08/16/2019   Acute post-hemorrhagic anemia 10/10/2023   Allergy     Cipro, Plavix, Statins   Anemia    Arthritis    Atrial fibrillation (HCC)    CAD (coronary artery disease)    a. s/p tandem Promus DES to LAD in 2009 by Dr. Obie;  b.  LHC (03/22/14):  prox LAD 30%, mid LAD 40%, LAD stent ok with dist 20%  ISR, apical LAD occluded with L-L collats filling apical vessel (too small for PCI), mid RCA 20%, EF 70%.  Med Rx   Cancer [facility])    Chronic kidney disease, stage 3b (HCC)    Colon polyps    Diabetes mellitus    Diagnosed 2012   Diverticulosis of colon (without mention of hemorrhage)    DVT (deep venous thrombosis) (HCC) 12/2022   right leg   GERD (gastroesophageal reflux disease)    Glaucoma    Grade I diastolic dysfunction    Grade I diastolic dysfunction    Herpes zoster without complication 04/24/2020   Hyperlipidemia    intol of statins   Hypertension    Kidney stones    Left atrial dilation    Moderate aortic regurgitation    Motion sickness    back seat cars   NHL (non-Hodgkin's lymphoma) (HCC) dx'd 2003   chemo/xrt comp 2003   Personal history of colonic polyps    Proctitis    Pruritic intertrigo 07/18/2014   Urticaria    Wears dentures    Full upper, partial lower   Wears hearing aid in both ears     Social History   Socioeconomic History   Marital status: Widowed    Spouse name: Not on file   Number of children: 0   Years of education: Not on file  Highest education level: 12th grade  Occupational History   Occupation: Retired    Associate Professor: RETIRED   Occupation: retired  Tobacco Use   Smoking status: Never   Smokeless tobacco: Never  Vaping Use   Vaping status: Never Used  Substance and Sexual Activity   Alcohol use: No   Drug use: No   Sexual activity: Not Currently  Other Topics Concern   Not on file  Social History Narrative   Not on file   Social Drivers of Health   Financial Resource Strain: Low Risk  (09/24/2024)   Overall Financial Resource Strain (CARDIA)    Difficulty of Paying Living Expenses: Not hard at all  Food Insecurity: No Food Insecurity (09/24/2024)   Hunger Vital Sign    Worried About Running Out of Food in the Last Year: Never true    Ran Out of Food in the Last Year: Never true  Transportation Needs: No Transportation  Needs (09/24/2024)   PRAPARE - Administrator, Civil Service (Medical): No    Lack of Transportation (Non-Medical): No  Physical Activity: Inactive (09/24/2024)   Exercise Vital Sign    Days of Exercise per Week: 0 days    Minutes of Exercise per Session: Not on file  Stress: No Stress Concern Present (09/24/2024)   [employer] of Occupational Health - Occupational Stress Questionnaire    Feeling of Stress: Not at all  Social Connections: Moderately Isolated (09/24/2024)   Social Connection and Isolation Panel    Frequency of Communication with Friends and Family: More than three times a week    Frequency of Social Gatherings with Friends and Family: Once a week    Attends Religious Services: More than 4 times per year    Active Member of [employer] or Organizations: No    Attends Banker Meetings: Not on file    Marital Status: Widowed  Intimate Partner Violence: Not At Risk (07/23/2024)   Humiliation, Afraid, Rape, and Kick questionnaire    Fear of Current or Ex-Partner: No    Emotionally Abused: No    Physically Abused: No    Sexually Abused: No    Past Surgical History:  Procedure Laterality Date   BIOPSY  10/15/2023   Procedure: BIOPSY;  Surgeon: Aundria, Ladell POUR, MD;  Location: [facility] ENDOSCOPY;  Service: Gastroenterology;;   cardiac cath-neg     CATARACT EXTRACTION Bilateral    COLONOSCOPY WITH PROPOFOL  N/A 10/15/2023   Procedure: COLONOSCOPY WITH PROPOFOL ;  Surgeon: Toledo, Ladell POUR, MD;  Location: [facility] ENDOSCOPY;  Service: Gastroenterology;  Laterality: N/A;   CORONARY ANGIOPLASTY WITH STENT PLACEMENT     x 2   dexa-neg     ESOPHAGOGASTRODUODENOSCOPY N/A 07/13/2024   Procedure: EGD (ESOPHAGOGASTRODUODENOSCOPY);  Surgeon: Jinny Carmine, MD;  Location: [facility] ENDOSCOPY;  Service: Endoscopy;  Laterality: N/A;   ESOPHAGOGASTRODUODENOSCOPY N/A 07/15/2024   Procedure: EGD (ESOPHAGOGASTRODUODENOSCOPY);  Surgeon: Onita Elspeth Sharper, DO;  Location: [facility]  ENDOSCOPY;  Service: Gastroenterology;  Laterality: N/A;   ESOPHAGOGASTRODUODENOSCOPY N/A 08/09/2024   Procedure: EGD (ESOPHAGOGASTRODUODENOSCOPY);  Surgeon: Wilhelmenia Aloha Raddle., MD;  Location: [facility] ENDOSCOPY;  Service: Gastroenterology;  Laterality: N/A;   ESOPHAGOGASTRODUODENOSCOPY N/A 08/30/2024   Procedure: EGD (ESOPHAGOGASTRODUODENOSCOPY);  Surgeon: Wilhelmenia Aloha Raddle., MD;  Location: [facility] ENDOSCOPY;  Service: Gastroenterology;  Laterality: N/A;   ESOPHAGOGASTRODUODENOSCOPY (EGD) WITH PROPOFOL  N/A 10/15/2023   Procedure: ESOPHAGOGASTRODUODENOSCOPY (EGD) WITH PROPOFOL ;  Surgeon: Toledo, Ladell POUR, MD;  Location: [facility] ENDOSCOPY;  Service: Gastroenterology;  Laterality: N/A;   EUS N/A 08/09/2024  Procedure: ULTRASOUND, UPPER GI TRACT, ENDOSCOPIC;  Surgeon: Mansouraty, Aloha Raddle., MD;  Location: [facility] ENDOSCOPY;  Service: Gastroenterology;  Laterality: N/A;   EUS N/A 08/30/2024   Procedure: ULTRASOUND, UPPER GI TRACT, ENDOSCOPIC;  Surgeon: Wilhelmenia Aloha Raddle., MD;  Location: [facility] ENDOSCOPY;  Service: Gastroenterology;  Laterality: N/A;   FINE NEEDLE ASPIRATION  08/09/2024   Procedure: FINE NEEDLE ASPIRATION;  Surgeon: Wilhelmenia Aloha Raddle., MD;  Location: [facility];  Service: Gastroenterology;;   FINE NEEDLE ASPIRATION  08/30/2024   Procedure: FINE NEEDLE ASPIRATION;  Surgeon: Wilhelmenia Aloha Raddle., MD;  Location: [facility] ENDOSCOPY;  Service: Gastroenterology;;   HEMOSTASIS CLIP PLACEMENT  10/15/2023   Procedure: HEMOSTASIS CLIP PLACEMENT;  Surgeon: Aundria, Ladell POUR, MD;  Location: [facility] ENDOSCOPY;  Service: Gastroenterology;;   KNEE SURGERY  10/30/2003   left   LEFT HEART CATHETERIZATION WITH CORONARY ANGIOGRAM N/A 03/22/2014   Procedure: LEFT HEART CATHETERIZATION WITH CORONARY ANGIOGRAM;  Surgeon: Lonni JONETTA Cash, MD;  Location: [facility] CATH LAB;  Service: Cardiovascular;  Laterality: N/A;   POLYPECTOMY  10/15/2023   Procedure: POLYPECTOMY;  Surgeon: Toledo, Ladell POUR, MD;  Location:  [facility] ENDOSCOPY;  Service: Gastroenterology;;   stress cardiolite      VAGINAL HYSTERECTOMY     partial, fibroids, one ovary left    Family History  Problem Relation Age of Onset   Cancer Mother        jaw   Hypertension Father    Heart attack Father    Heart attack Sister    Cancer Sister        unknown primary-mets throughout body   Lung cancer Brother    Colon cancer Neg Hx    Esophageal cancer Neg Hx    Pancreatic cancer Neg Hx    Stomach cancer Neg Hx    Liver disease Neg Hx     Allergies  Allergen Reactions   Other Rash and Anaphylaxis   Shellfish Allergy  Anaphylaxis and Rash   Venofer  [Iron  Sucrose] Anaphylaxis    Severe anaphylactic reaction to Venofer -needing chest compressions; EpiPen .    Cephalexin      REACTION: trash and swelling   Ciprofloxacin     REACTION: u/k   Clopidogrel Bisulfate     REACTION: swelling, rash   Ezetimibe -Simvastatin     REACTION: myalgias   Lidocaine  Hives    ONLY TO ADHESIVE LIDOCAINE  PATCHES. NO PROBLEM WITH INJECTABLE LIDOCAINE .   Nitrofurantoin     REACTION: uk   Pregabalin     REACTION: felt bad   Celecoxib Rash    REACTION: uk   Codeine Rash   Codeine Phosphate Rash   Hydrocod Poli-Chlorphe Poli Er Rash   Propoxyphene Rash   Propoxyphene N-Acetaminophen  Rash   Rofecoxib Rash   Sulfa Antibiotics Rash   Sulfamethoxazole Rash   Sulfonamide Derivatives Rash    Current Outpatient Medications on File Prior to Visit  Medication Sig Dispense Refill   Bempedoic Acid -Ezetimibe  (NEXLIZET ) 180-10 MG TABS TAKE 1 TABLET BY MOUTH DAILY 90 tablet 3   Blood Glucose Monitoring Suppl DEVI 1 each by Does not apply route in the morning, at noon, and at bedtime. May substitute to any manufacturer covered by patient's insurance. 1 each 0   Cholecalciferol  (VITAMIN D -3) 1000 UNITS CAPS Take 1 capsule by mouth daily.     CRANBERRY PO Take 1 tablet by mouth daily.     diclofenac  Sodium (VOLTAREN ) 1 % GEL Apply 4 g topically 4 (four) times  daily. To your chest wall 350 g 1   erythromycin  ophthalmic ointment SMARTSIG:In Eye(s)     ezetimibe  (ZETIA ) 10 MG tablet Take 1 tablet (10 mg total) by mouth daily. 90 tablet 1   ferrous sulfate  325 (65 FE) MG tablet Take 1 tablet (325 mg total) by mouth daily with breakfast. 30 tablet 2   fexofenadine  (ALLEGRA ) 180 MG tablet Take 1 tablet (180 mg total) by mouth as needed for allergies or rhinitis. 90 tablet 0   gabapentin  (NEURONTIN ) 100 MG capsule Take 200 mg by mouth 2 (two) times daily.     glipiZIDE  (GLUCOTROL  XL) 2.5 MG 24 hr tablet Take 1 tablet (2.5 mg total) by mouth daily with breakfast. for diabetes. 90 tablet 0   Glucose Blood (BLOOD GLUCOSE TEST STRIPS) STRP 1 each by In Vitro route in the morning, at noon, and at bedtime. May substitute to any manufacturer covered by patient's insurance. 300 strip 5   imatinib  (GLEEVEC ) 400 MG tablet Take 1 tablet (400 mg total) by mouth daily. Take with meals and large glass of water. 30 tablet 1   Lancets Misc. MISC 1 each by Does not apply route in the morning, at noon, and at bedtime. May substitute to any manufacturer covered by patient's insurance. 300 each 5   metoprolol  succinate (TOPROL -XL) 100 MG 24 hr tablet TAKE 1 TABLET BY MOUTH EVERY MORNING WITH OR IMMEDIATELY FOLLOWING A MEAL 90 tablet 3   nitroGLYCERIN  (NITROSTAT ) 0.4 MG SL tablet Place 1 tablet (0.4 mg total) under the tongue every 5 (five) minutes as needed for chest pain. 25 tablet 2   ondansetron  (ZOFRAN -ODT) 4 MG disintegrating tablet Take 1 tablet (4 mg total) by mouth every 8 (eight) hours as needed for nausea or vomiting. 90 tablet 0   pantoprazole  (PROTONIX ) 40 MG tablet Take 1 tablet (40 mg total) by mouth 2 (two) times daily. TAKE 1 TABLET BY MOUTH DAILY FOR HEARTBURN (Patient taking differently: Take 80 mg by mouth daily. TAKE 2 TABLET BY MOUTH DAILY FOR HEARTBURN in am) 60 tablet 12   sertraline  (ZOLOFT ) 100 MG tablet TAKE 1 TABLET BY MOUTH DAILY FOR ANXIETY AND DEPRESSION  90 tablet 0   vitamin B-12 (CYANOCOBALAMIN ) 1000 MCG tablet Take 1,000 mcg by mouth every other day.     [DISCONTINUED] loratadine  (CLARITIN ) 10 MG tablet Take 10 mg by mouth as needed.      No current facility-administered medications on file prior to visit.    BP 130/80   Pulse (!) 102   Temp 98.4 F (36.9 C) (Oral)   Ht 5' 9 (1.753 m)   Wt 159 lb 3.2 oz (72.2 kg)   SpO2 94%   BMI 23.51 kg/m  Objective:   Physical Exam Constitutional:      Appearance: She is not ill-appearing.  Cardiovascular:     Rate and Rhythm: Normal rate and regular rhythm.  Pulmonary:     Effort: Pulmonary effort is normal.     Breath sounds: Normal breath sounds.  Musculoskeletal:     Cervical back: Neck supple.  Skin:    General: Skin is warm and dry.  Neurological:     Mental Status: She is alert and oriented to person, place, and time.  Psychiatric:        Mood and Affect: Mood normal.     Physical Exam        Assessment & Plan:  Acute cough Assessment & Plan: Suspect viral etiology given HPI and exam today.  Respiratory exam reassuring. Reviewed labs from last week which appear stable.  Negative rapid flu and Covid-19 tests today.  Discussed to continue Tylenol , Delsym PRN. We also discussed that symptoms may become worse before improved.  Her family will closely monitor and report if fevers return and increase >102. Work on hydration.   Return precautions provided.   Orders: -     POCT Influenza A/B -     POC COVID-19 BinaxNow  Overactive bladder Assessment & Plan: Discussed options for treatment.  She elects medication.  Start oxybutynin  XL 5 mg daily at bedtime. We discussed potential side effects and precaution regarding dehydration.  Orders: -     oxyBUTYnin  Chloride ER; Take 1 tablet (5 mg total) by mouth at bedtime. For overactive bladder  Dispense: 90 tablet; Refill: 0    Assessment and Plan Assessment & Plan         Comer MARLA Gaskins,  NP     History of Present Illness

## 2024-10-18 ENCOUNTER — Ambulatory Visit: Payer: Self-pay | Admitting: Primary Care

## 2024-10-19 ENCOUNTER — Telehealth: Admitting: Family Medicine

## 2024-10-19 ENCOUNTER — Telehealth: Payer: Self-pay

## 2024-10-19 DIAGNOSIS — J4540 Moderate persistent asthma, uncomplicated: Secondary | ICD-10-CM | POA: Diagnosis not present

## 2024-10-19 MED ORDER — AMOXICILLIN-POT CLAVULANATE 875-125 MG PO TABS: 1.0000 | ORAL_TABLET | Freq: Two times a day (BID) | ORAL | 0 refills | Status: AC

## 2024-10-19 MED ORDER — ALBUTEROL SULFATE HFA 108 (90 BASE) MCG/ACT IN AERS: 2.0000 | INHALATION_SPRAY | Freq: Four times a day (QID) | RESPIRATORY_TRACT | 0 refills | Status: AC | PRN

## 2024-10-19 MED ORDER — PREDNISONE 10 MG (21) PO TBPK: ORAL_TABLET | ORAL | 0 refills | Status: DC

## 2024-10-19 NOTE — Patient Instructions (Signed)

## 2024-10-19 NOTE — Telephone Encounter (Signed)
 Call placed to [facility] pathology regarding succinate dehydrogenase results. Specimen was sent out to [facility] 11/18 and TAT is 10-14 days.

## 2024-10-19 NOTE — Progress Notes (Signed)
 Virtual Visit Consent   [name] [name], you are scheduled for a virtual visit with a [facility] provider today. Just as with appointments in the office, your consent must be obtained to participate. Your consent will be active for this visit and any virtual visit you may have with one of our providers in the next 365 days. If you have a MyChart account, a copy of this consent can be sent to you electronically.  As this is a virtual visit, video technology does not allow for your provider to perform a traditional examination. This may limit your provider's ability to fully assess your condition. If your provider identifies any concerns that need to be evaluated in person or the need to arrange testing (such as labs, EKG, etc.), we will make arrangements to do so. Although advances in technology are sophisticated, we cannot ensure that it will always work on either your end or our end. If the connection with a video visit is poor, the visit may have to be switched to a telephone visit. With either a video or telephone visit, we are not always able to ensure that we have a secure connection.  By engaging in this virtual visit, you consent to the provision of healthcare and authorize for your insurance to be billed (if applicable) for the services provided during this visit. Depending on your insurance coverage, you may receive a charge related to this service.  I need to obtain your verbal consent now. Are you willing to proceed with your visit today? [name] [name] has provided verbal consent on 10/19/2024 for a virtual visit (video or telephone). Loa Lamp, FNP  Date: 10/19/2024 6:39 PM   Virtual Visit via Video Note   I, Loa Lamp, connected with  [name] [name]  ([id number], [birth year]) on 10/19/24 at  6:45 PM EST by a video-enabled telemedicine application and verified that I am speaking with the correct person using two identifiers.  Location: Patient: Virtual Visit Location Patient:  Home Provider: Virtual Visit Location Provider: Home Office   I discussed the limitations of evaluation and management by telemedicine and the availability of in person appointments. The patient expressed understanding and agreed to proceed.    History of Present Illness: [name] [name] is a 89 y.o. who identifies as a female who was assigned female at birth, and is being seen today for cough worsening this week. She was seen by pcp Wednesday but sx have significantly worsened since then with fever up to 100 today. She has cough, productive with brown and gray mucus, wheezing etc. She has a history of pneumonia. 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/2024   Gastric ulcer 07/23/2024   Gastric mass 07/23/2024   Symptomatic anemia 07/23/2024   CAD (coronary artery disease) 07/23/2024   Myocardial injury 07/23/2024   Anaphylaxis to venofer  07/23/2024   Chronic diastolic CHF (congestive heart failure) (HCC) 07/23/2024   Chronic kidney disease, stage 3b (HCC) 07/23/2024   Hypokalemia 07/23/2024   Heme positive stool 07/16/2024   Coffee ground emesis 07/13/2024   Nausea and vomiting 07/12/2024   GI bleed 07/12/2024   Urinary frequency 05/23/2024   Syncope and collapse 10/09/2023   Iron  deficiency anemia 08/24/2023   Persistent atrial fibrillation (HCC) 08/17/2023   Hypercoagulable state  due to persistent atrial fibrillation (HCC) 08/17/2023   DVT (deep venous thrombosis) (HCC) 12/29/2022   Dark stools 12/29/2022   Acute cough 11/26/2022   Chronic pain of both knees 08/27/2022   Urinary incontinence 02/05/2022   Hav (hallux abducto valgus), unspecified laterality 01/25/2022   Nonrheumatic aortic valve insufficiency 10/05/2021   Bilateral lower extremity edema 03/26/2020    Preventative health care 06/05/2019   Recurrent UTI 01/12/2018   Insomnia 09/27/2017   Anxiety and depression 05/04/2016   Osteopenia 08/06/2015   Constipation 12/30/2014   B12 deficiency 12/30/2014   Allergic reaction 09/02/2014   Chronic fatigue 06/04/2014   Elevated LFTs 04/19/2014   Mitral regurgitation 04/19/2014   Renal insufficiency 06/07/2011   Type 2 diabetes mellitus with chronic kidney disease (HCC) 05/31/2011   Mixed hyperlipidemia 08/04/2010   LUMBAR SPRAIN AND STRAIN 01/15/2010   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 02/25/2009   Chronic diarrhea 02/25/2009   EXTERNAL HEMORRHOIDS 02/24/2009   DIVERTICULOSIS, COLON 02/24/2009   LYMPHOMA, MALT 08/28/2007   DETACHED RETINA 08/28/2007   Essential hypertension 08/28/2007   Coronary artery disease involving native coronary artery of native heart without angina pectoris 08/28/2007   MITRAL VALVE PROLAPSE 08/28/2007   GERD 08/28/2007   IBS 08/28/2007   BREAST CYSTS, BILATERAL 08/28/2007    Allergies:  Allergies  Allergen Reactions   Other Rash and Anaphylaxis   Shellfish Allergy  Anaphylaxis and Rash   Venofer  [Iron  Sucrose] Anaphylaxis    Severe anaphylactic reaction to Venofer -needing chest compressions; EpiPen .    Cephalexin      REACTION: trash and swelling   Ciprofloxacin     REACTION: u/k   Clopidogrel Bisulfate     REACTION: swelling, rash   Ezetimibe -Simvastatin     REACTION: myalgias   Lidocaine  Hives    ONLY TO ADHESIVE LIDOCAINE  PATCHES. NO PROBLEM WITH INJECTABLE LIDOCAINE .   Nitrofurantoin     REACTION: uk   Pregabalin     REACTION: felt bad   Celecoxib Rash    REACTION: uk   Codeine Rash   Codeine Phosphate Rash   Hydrocod Poli-Chlorphe Poli Er Rash   Propoxyphene Rash   Propoxyphene N-Acetaminophen  Rash   Rofecoxib Rash   Sulfa Antibiotics Rash   Sulfamethoxazole Rash   Sulfonamide Derivatives Rash   Medications:  Current Outpatient Medications:    albuterol  (VENTOLIN   HFA) 108 (90 Base) MCG/ACT inhaler, Inhale 2 puffs into the lungs every 6 (six) hours as needed for wheezing or shortness of breath., Disp: 8 g, Rfl: 0   amoxicillin -clavulanate (AUGMENTIN ) 875-125 MG tablet, Take 1 tablet by mouth 2 (two) times daily for 10 days., Disp: 20 tablet, Rfl: 0   predniSONE  (STERAPRED UNI-PAK 21 TAB) 10 MG (21) TBPK tablet, Prednisone  5 mg- 6 day dose pack as directed. - #1 no refills., Disp: 1 each, Rfl: 0   Bempedoic Acid -Ezetimibe  (NEXLIZET ) 180-10 MG TABS, TAKE 1 TABLET BY MOUTH DAILY, Disp: 90 tablet, Rfl: 3   Blood Glucose Monitoring Suppl DEVI, 1 each by Does not apply route in the morning, at noon, and at bedtime. May substitute to any manufacturer covered by patient's insurance., Disp: 1 each, Rfl: 0   Cholecalciferol  (VITAMIN D -3) 1000 UNITS CAPS, Take 1 capsule by mouth daily., Disp: , Rfl:    CRANBERRY PO, Take 1 tablet by mouth daily., Disp: , Rfl:    diclofenac  Sodium (VOLTAREN ) 1 % GEL, Apply 4 g topically 4 (four) times daily. To your chest wall, Disp: 350 g, Rfl: 1   erythromycin  ophthalmic ointment, SMARTSIG:In Eye(s), Disp: , Rfl:    ezetimibe  (ZETIA ) 10 MG tablet, Take 1 tablet (10 mg total) by mouth daily., Disp: 90 tablet, Rfl: 1   ferrous sulfate  325 (65 FE) MG tablet, Take 1 tablet (325 mg total) by mouth daily with breakfast., Disp: 30 tablet, Rfl: 2   fexofenadine  (ALLEGRA ) 180 MG tablet, Take 1 tablet (180 mg total) by mouth as needed for allergies or rhinitis., Disp: 90 tablet, Rfl: 0   gabapentin  (NEURONTIN ) 100 MG capsule, Take 200 mg by mouth 2 (two) times daily., Disp: , Rfl:    glipiZIDE  (GLUCOTROL  XL) 2.5 MG 24 hr tablet, Take 1 tablet (2.5 mg total) by mouth daily with breakfast. for diabetes., Disp: 90 tablet, Rfl: 0   Glucose Blood (BLOOD GLUCOSE TEST STRIPS) STRP, 1 each by In Vitro route in the morning, at noon, and at bedtime. May substitute to any manufacturer covered by patient's insurance., Disp: 300 strip, Rfl: 5   imatinib   (GLEEVEC ) 400 MG tablet, Take 1 tablet (400 mg total) by mouth daily. Take with meals and large glass of water., Disp: 30 tablet, Rfl: 1   Lancets Misc. MISC, 1 each by Does not apply route in the morning, at noon, and at bedtime. May substitute to any manufacturer covered by patient's insurance., Disp: 300 each, Rfl: 5   metoprolol  succinate (TOPROL -XL) 100 MG 24 hr tablet, TAKE 1 TABLET BY MOUTH EVERY MORNING WITH OR IMMEDIATELY FOLLOWING A MEAL, Disp: 90 tablet, Rfl: 3   nitroGLYCERIN  (NITROSTAT ) 0.4 MG SL tablet, Place 1 tablet (0.4 mg total) under the tongue every 5 (five) minutes as needed for chest pain., Disp: 25 tablet, Rfl: 2   ondansetron  (ZOFRAN -ODT) 4 MG disintegrating tablet, Take 1 tablet (4 mg total) by mouth every 8 (eight) hours as needed for nausea or vomiting., Disp: 90 tablet, Rfl: 0   oxybutynin  (DITROPAN -XL) 5 MG 24 hr tablet, Take 1 tablet (5 mg total) by mouth at bedtime. For overactive bladder, Disp: 90 tablet, Rfl: 0   pantoprazole  (PROTONIX ) 40 MG tablet, Take 1 tablet (40 mg total) by mouth 2 (two) times daily. TAKE 1 TABLET BY MOUTH DAILY FOR HEARTBURN (Patient taking differently: Take 80 mg by mouth daily. TAKE 2 TABLET BY MOUTH DAILY FOR HEARTBURN in am), Disp: 60 tablet, Rfl: 12   sertraline  (ZOLOFT ) 100 MG tablet, TAKE 1 TABLET BY MOUTH DAILY FOR ANXIETY AND DEPRESSION, Disp: 90 tablet, Rfl: 0   vitamin B-12 (CYANOCOBALAMIN ) 1000 MCG tablet, Take 1,000 mcg by mouth every other day., Disp: , Rfl:   Observations/Objective: Patient is well-developed, well-nourished in no acute distress.  Resting comfortably  at home.  Head is normocephalic, atraumatic.  No labored breathing.  Speech is clear and coherent with logical content.  Patient is alert and oriented at baseline.    Assessment and Plan: 1. Moderate persistent asthmatic bronchitis without complication (Primary)  Increase fluids, humidifier at night, tylenol  as directed, UC if sx woren.   Follow Up  Instructions: I discussed the assessment and treatment plan with the patient. The patient was provided an opportunity to ask questions and all were answered. The patient agreed with the plan and demonstrated an understanding of the instructions.  A copy of instructions were sent to the patient via MyChart unless otherwise noted below.     The patient was advised to call back or seek an in-person evaluation if the symptoms worsen or if the condition fails to improve as anticipated.    Daniyla Pfahler,  FNP

## 2024-10-29 LAB — CYTOLOGY - NON PAP

## 2024-11-01 ENCOUNTER — Other Ambulatory Visit: Payer: Self-pay | Admitting: Primary Care

## 2024-11-01 DIAGNOSIS — Z961 Presence of intraocular lens: Secondary | ICD-10-CM | POA: Diagnosis not present

## 2024-11-01 DIAGNOSIS — H16001 Unspecified corneal ulcer, right eye: Secondary | ICD-10-CM | POA: Diagnosis not present

## 2024-11-01 DIAGNOSIS — H401133 Primary open-angle glaucoma, bilateral, severe stage: Secondary | ICD-10-CM | POA: Diagnosis not present

## 2024-11-01 DIAGNOSIS — E0821 Diabetes mellitus due to underlying condition with diabetic nephropathy: Secondary | ICD-10-CM

## 2024-11-02 ENCOUNTER — Telehealth: Payer: Self-pay | Admitting: Internal Medicine

## 2024-11-02 NOTE — Telephone Encounter (Signed)
 Succinate dehydrogenase is positive in the tumor.  Negative for c-kit and PDGF RA.  NGS-also negative for NF1.  I have sent an email to Dr. Billy at [facility].  Patient has appointment this afternoon- GB

## 2024-11-08 ENCOUNTER — Inpatient Hospital Stay: Admitting: Internal Medicine

## 2024-11-08 ENCOUNTER — Inpatient Hospital Stay

## 2024-11-09 ENCOUNTER — Inpatient Hospital Stay

## 2024-11-12 ENCOUNTER — Encounter: Payer: Self-pay | Admitting: Nurse Practitioner

## 2024-11-12 ENCOUNTER — Inpatient Hospital Stay: Attending: Internal Medicine

## 2024-11-12 ENCOUNTER — Inpatient Hospital Stay: Admitting: Nurse Practitioner

## 2024-11-12 VITALS — BP 137/81 | HR 94 | Temp 98.2°F | Resp 16 | Wt 156.2 lb

## 2024-11-12 DIAGNOSIS — K922 Gastrointestinal hemorrhage, unspecified: Secondary | ICD-10-CM | POA: Insufficient documentation

## 2024-11-12 DIAGNOSIS — Z801 Family history of malignant neoplasm of trachea, bronchus and lung: Secondary | ICD-10-CM | POA: Insufficient documentation

## 2024-11-12 DIAGNOSIS — D649 Anemia, unspecified: Secondary | ICD-10-CM | POA: Diagnosis not present

## 2024-11-12 DIAGNOSIS — D500 Iron deficiency anemia secondary to blood loss (chronic): Secondary | ICD-10-CM | POA: Insufficient documentation

## 2024-11-12 DIAGNOSIS — C49A2 Gastrointestinal stromal tumor of stomach: Secondary | ICD-10-CM | POA: Diagnosis present

## 2024-11-12 DIAGNOSIS — R911 Solitary pulmonary nodule: Secondary | ICD-10-CM | POA: Insufficient documentation

## 2024-11-12 DIAGNOSIS — Z808 Family history of malignant neoplasm of other organs or systems: Secondary | ICD-10-CM | POA: Insufficient documentation

## 2024-11-12 LAB — CBC WITH DIFFERENTIAL (CANCER CENTER ONLY)
Abs Immature Granulocytes: 0.02 K/uL (ref 0.00–0.07)
Basophils Absolute: 0 K/uL (ref 0.0–0.1)
Basophils Relative: 0 %
Eosinophils Absolute: 0.1 K/uL (ref 0.0–0.5)
Eosinophils Relative: 2 %
HCT: 35.7 % — ABNORMAL LOW (ref 36.0–46.0)
Hemoglobin: 11.9 g/dL — ABNORMAL LOW (ref 12.0–15.0)
Immature Granulocytes: 1 %
Lymphocytes Relative: 37 %
Lymphs Abs: 1.3 K/uL (ref 0.7–4.0)
MCH: 32.9 pg (ref 26.0–34.0)
MCHC: 33.3 g/dL (ref 30.0–36.0)
MCV: 98.6 fL (ref 80.0–100.0)
Monocytes Absolute: 0.3 K/uL (ref 0.1–1.0)
Monocytes Relative: 8 %
Neutro Abs: 1.9 K/uL (ref 1.7–7.7)
Neutrophils Relative %: 52 %
Platelet Count: 171 K/uL (ref 150–400)
RBC: 3.62 MIL/uL — ABNORMAL LOW (ref 3.87–5.11)
RDW: 14.6 % (ref 11.5–15.5)
WBC Count: 3.6 K/uL — ABNORMAL LOW (ref 4.0–10.5)
nRBC: 0 % (ref 0.0–0.2)

## 2024-11-12 LAB — CMP (CANCER CENTER ONLY)
ALT: 12 U/L (ref 0–44)
AST: 20 U/L (ref 15–41)
Albumin: 4.1 g/dL (ref 3.5–5.0)
Alkaline Phosphatase: 62 U/L (ref 38–126)
Anion gap: 10 (ref 5–15)
BUN: 19 mg/dL (ref 8–23)
CO2: 26 mmol/L (ref 22–32)
Calcium: 9.4 mg/dL (ref 8.9–10.3)
Chloride: 105 mmol/L (ref 98–111)
Creatinine: 1.21 mg/dL — ABNORMAL HIGH (ref 0.44–1.00)
GFR, Estimated: 42 mL/min — ABNORMAL LOW (ref >60)
Glucose, Bld: 187 mg/dL — ABNORMAL HIGH (ref 70–99)
Potassium: 5 mmol/L (ref 3.5–5.1)
Sodium: 140 mmol/L (ref 135–145)
Total Bilirubin: 0.4 mg/dL (ref 0.0–1.2)
Total Protein: 6.4 g/dL — ABNORMAL LOW (ref 6.5–8.1)

## 2024-11-12 LAB — SAMPLE TO BLOOD BANK

## 2024-11-12 NOTE — Progress Notes (Unsigned)
 [facility] CONSULT NOTE  Patient Care Team: Gretta Comer POUR, NP as PCP - General (Internal Medicine) Perla Evalene PARAS, MD as PCP - Cardiology (Cardiology) Alline Lenis, MD (Inactive) as Consulting Physician (Urology) Portia Fireman, OD as Consulting Physician (Optometry) Homsher, Wanda, RN as [facility] Management Rennie Cindy SAUNDERS, MD as Consulting Physician (Oncology) Maurie Rayfield BIRCH, RN as Oncology Nurse Navigator  CHIEF COMPLAINTS/PURPOSE OF CONSULTATION: GIST   Oncology History Overview Note  Gastric tumor- AUG 2025- CT A/P: Large mass within the lumen of the stomach-  Large round well-circumscribed mass in the mid stomach measures 6.3 x 6.3 cm (image 63) mass is contained within lumen of the stomach.. No evidence of metastatic adenopathy in the abdomen pelvis. No liver metastasis. No peritoneal metastasis. CT chest:  Solitary pulmonary nodule in the RIGHT lower lobe is new from 2014. Indeterminate finding. FDG PET scan may aid in determining malignant potential. 2. No mediastinal adenopathy.  PET scan:  6.6 cm gastric mass has diffuse low-grade metabolic activity, 1.0 cm left lower lobe pulmonary nodule has a maximum SUV of 2.5, borderline for malignancy. This nodule was not present on 08/09/2013.   # AUG 2025EGD biops-y negative/inconclusive.  SEP 2025- S/p endoscopic ultrasound with biopsy [Dr.Mansouraty, [place]]- positive for spindle cell neoplasm.  EUS-suggestive of subepithelial/from the muscularis.  however, given the a cellularity-unable to perform IHC stains-differential diagnosis includes GIST tumor/leiomyoma vs others.  Ordered-liquid biopsy-NEG  # OCT 2025- GIST- however- ckit & PDGFRA- NEG; ATYPICAL GIST. On GLEEVEC  400 mg x1 month- STOPPED AFTER NGS Results.   #     # NHL/of the bowel - [[facility]]treated >25  y ago- RT.  Chemo x 6- followed by RT- No surgery.    Malignant spindle cell neoplasm (HCC)  08/16/2024 Initial Diagnosis   Malignant spindle  cell neoplasm (HCC)     HISTORY OF PRESENTING ILLNESS:   Accompanied by family.  Walking with a rolling walker  [name] [name] 89 y.o.  female patient with a medical history significant of  persistent Atrial fibrillation-currently off Eliquis ,  stomach GIST; CKD IIIa-iron  deficient anemia  no longer on Gleevec  is here for a follow up.    History of Present Illness   [name] [name] is a 89 year old female with gastrointestinal stromal tumor (GIST) on Gleevec  400 mg a day who presents for follow-up. She is staying with her niece.   She feels easily fatigued, requiring rest after activities such as sweeping or washing dishes.  She has noticed swelling in her feet, which she manages with compression stockings and by keeping her feet elevated. The stockings are difficult to put on.  She has dry, itchy skin on her hands and uses a moisturizer. She has experienced weight loss, noting a decrease from 167 to 159 pounds over a month. She received fluids during a recent visit due to dehydration and finds plain water unpalatable.  She has a swelling around the eye. She is on Trulicity  and is off glipizide  as per her family doctor's advice.      Review of Systems  Constitutional:  Positive for malaise/fatigue. Negative for chills, diaphoresis, fever and weight loss.  HENT:  Negative for nosebleeds and sore throat.   Eyes:  Negative for double vision.  Respiratory:  Negative for cough, hemoptysis, sputum production, shortness of breath and wheezing.   Cardiovascular:  Negative for chest pain, palpitations, orthopnea and leg swelling.  Gastrointestinal:  Negative for abdominal pain, blood in stool, constipation, diarrhea, heartburn,  melena, nausea and vomiting.  Genitourinary:  Negative for dysuria, frequency and urgency.  Musculoskeletal:  Negative for back pain and joint pain.  Skin: Negative.  Negative for itching and rash.  Neurological:  Negative for dizziness, tingling, focal weakness, weakness  and headaches.  Endo/Heme/Allergies:  Does not bruise/bleed easily.  Psychiatric/Behavioral:  Negative for depression. The patient is not nervous/anxious and does not have insomnia.     MEDICAL HISTORY:  Past Medical History:  Diagnosis Date   Acute bilateral thoracic back pain 08/16/2019   Acute post-hemorrhagic anemia 10/10/2023   Allergy     Cipro, Plavix, Statins   Anemia    Arthritis    Atrial fibrillation (HCC)    CAD (coronary artery disease)    a. s/p tandem Promus DES to LAD in 2009 by Dr. Obie;  b.  LHC (03/22/14):  prox LAD 30%, mid LAD 40%, LAD stent ok with dist 20% ISR, apical LAD occluded with L-L collats filling apical vessel (too small for PCI), mid RCA 20%, EF 70%.  Med Rx   Cancer [facility])    Chronic kidney disease, stage 3b (HCC)    Colon polyps    Diabetes mellitus    Diagnosed 2012   Diverticulosis of colon (without mention of hemorrhage)    DVT (deep venous thrombosis) (HCC) 12/2022   right leg   GERD (gastroesophageal reflux disease)    Glaucoma    Grade I diastolic dysfunction    Grade I diastolic dysfunction    Herpes zoster without complication 04/24/2020   Hyperlipidemia    intol of statins   Hypertension    Kidney stones    Left atrial dilation    Moderate aortic regurgitation    Motion sickness    back seat cars   NHL (non-Hodgkin's lymphoma) (HCC) dx'd 2003   chemo/xrt comp 2003   Personal history of colonic polyps    Proctitis    Pruritic intertrigo 07/18/2014   Urticaria    Wears dentures    Full upper, partial lower   Wears hearing aid in both ears     SURGICAL HISTORY: Past Surgical History:  Procedure Laterality Date   BIOPSY  10/15/2023   Procedure: BIOPSY;  Surgeon: Aundria, Ladell POUR, MD;  Location: [facility] ENDOSCOPY;  Service: Gastroenterology;;   cardiac cath-neg     CATARACT EXTRACTION Bilateral    COLONOSCOPY WITH PROPOFOL  N/A 10/15/2023   Procedure: COLONOSCOPY WITH PROPOFOL ;  Surgeon:  Toledo, Ladell POUR, MD;  Location: [facility] ENDOSCOPY;  Service: Gastroenterology;  Laterality: N/A;   CORONARY ANGIOPLASTY WITH STENT PLACEMENT     x 2   dexa-neg     ESOPHAGOGASTRODUODENOSCOPY N/A 07/13/2024   Procedure: EGD (ESOPHAGOGASTRODUODENOSCOPY);  Surgeon: Jinny Carmine, MD;  Location: [facility] ENDOSCOPY;  Service: Endoscopy;  Laterality: N/A;   ESOPHAGOGASTRODUODENOSCOPY N/A 07/15/2024   Procedure: EGD (ESOPHAGOGASTRODUODENOSCOPY);  Surgeon: Onita Elspeth Sharper, DO;  Location: [facility] ENDOSCOPY;  Service: Gastroenterology;  Laterality: N/A;   ESOPHAGOGASTRODUODENOSCOPY N/A 08/09/2024   Procedure: EGD (ESOPHAGOGASTRODUODENOSCOPY);  Surgeon: Wilhelmenia Aloha Raddle., MD;  Location: [facility] ENDOSCOPY;  Service: Gastroenterology;  Laterality: N/A;   ESOPHAGOGASTRODUODENOSCOPY N/A 08/30/2024   Procedure: EGD (ESOPHAGOGASTRODUODENOSCOPY);  Surgeon: Wilhelmenia Aloha Raddle., MD;  Location: [facility] ENDOSCOPY;  Service: Gastroenterology;  Laterality: N/A;   ESOPHAGOGASTRODUODENOSCOPY (EGD) WITH PROPOFOL  N/A 10/15/2023   Procedure: ESOPHAGOGASTRODUODENOSCOPY (EGD) WITH PROPOFOL ;  Surgeon: Toledo, Ladell POUR, MD;  Location: [facility] ENDOSCOPY;  Service: Gastroenterology;  Laterality: N/A;   EUS N/A 08/09/2024   Procedure: ULTRASOUND, UPPER GI TRACT, ENDOSCOPIC;  Surgeon: Wilhelmenia Aloha  Mickey., MD;  Location: [facility] ENDOSCOPY;  Service: Gastroenterology;  Laterality: N/A;   EUS N/A 08/30/2024   Procedure: ULTRASOUND, UPPER GI TRACT, ENDOSCOPIC;  Surgeon: Wilhelmenia Aloha Mickey., MD;  Location: [facility] ENDOSCOPY;  Service: Gastroenterology;  Laterality: N/A;   FINE NEEDLE ASPIRATION  08/09/2024   Procedure: FINE NEEDLE ASPIRATION;  Surgeon: Wilhelmenia Aloha Mickey., MD;  Location: [facility];  Service: Gastroenterology;;   FINE NEEDLE ASPIRATION  08/30/2024   Procedure: FINE NEEDLE ASPIRATION;  Surgeon: Wilhelmenia Aloha Mickey., MD;  Location: [facility]COPY;  Service: Gastroenterology;;   HEMOSTASIS CLIP PLACEMENT  10/15/2023    Procedure: HEMOSTASIS CLIP PLACEMENT;  Surgeon: Aundria, Ladell POUR, MD;  Location: [facility] ENDOSCOPY;  Service: Gastroenterology;;   KNEE SURGERY  10/30/2003   left   LEFT HEART CATHETERIZATION WITH CORONARY ANGIOGRAM N/A 03/22/2014   Procedure: LEFT HEART CATHETERIZATION WITH CORONARY ANGIOGRAM;  Surgeon: Lonni JONETTA Cash, MD;  Location: [facility] CATH LAB;  Service: Cardiovascular;  Laterality: N/A;   POLYPECTOMY  10/15/2023   Procedure: POLYPECTOMY;  Surgeon: Toledo, Ladell POUR, MD;  Location: [facility] ENDOSCOPY;  Service: Gastroenterology;;   stress cardiolite      VAGINAL HYSTERECTOMY     partial, fibroids, one ovary left    SOCIAL HISTORY: Social History   Socioeconomic History   Marital status: Widowed    Spouse name: Not on file   Number of children: 0   Years of education: Not on file   Highest education level: 12th grade  Occupational History   Occupation: Retired    Associate Professor: RETIRED   Occupation: retired  Tobacco Use   Smoking status: Never   Smokeless tobacco: Never  Vaping Use   Vaping status: Never Used  Substance and Sexual Activity   Alcohol use: No   Drug use: No   Sexual activity: Not Currently  Other Topics Concern   Not on file  Social History Narrative   Not on file   Social Drivers of Health   Tobacco Use: Low Risk (11/12/2024)   Patient History    Smoking Tobacco Use: Never    Smokeless Tobacco Use: Never    Passive Exposure: Not on file  Financial Resource Strain: Low Risk (09/24/2024)   Overall Financial Resource Strain (CARDIA)    Difficulty of Paying Living Expenses: Not hard at all  Food Insecurity: No Food Insecurity (11/02/2024)   Received from [facility]   Epic    Within the past 12 months, you worried that your food would run out before you got the money to buy more.: Never true    Within the past 12 months, the food you bought just didn't last and you didn't have money to get more.: Never true  Transportation  Needs: No Transportation Needs (11/02/2024)   Received from [facility] - Transportation    Lack of Transportation (Medical): No    Lack of Transportation (Non-Medical): No  Physical Activity: Inactive (09/24/2024)   Exercise Vital Sign    Days of Exercise per Week: 0 days    Minutes of Exercise per Session: Not on file  Stress: No Stress Concern Present (09/24/2024)   [employer] of Occupational [employer] - Occupational Stress Questionnaire    Feeling of Stress: Not at all  Social Connections: Moderately Isolated (09/24/2024)   Social Connection and Isolation Panel    Frequency of Communication with Friends and Family: More than three times a week    Frequency of Social Gatherings with Friends and Family: Once a week  Attends Religious Services: More than 4 times per year    Active Member of Clubs or Organizations: No    Attends Banker Meetings: Not on file    Marital Status: Widowed  Intimate Partner Violence: Not At Risk (07/23/2024)   Epic    Fear of Current or Ex-Partner: No    Emotionally Abused: No    Physically Abused: No    Sexually Abused: No  Depression (PHQ2-9): Low Risk (11/12/2024)   Depression (PHQ2-9)    PHQ-2 Score: 0  Alcohol Screen: Not on file  Housing: Low Risk (09/24/2024)   Epic    Unable to Pay for Housing in the Last Year: No    Number of Times Moved in the Last Year: 1    Homeless in the Last Year: No  Utilities: Low Risk (11/02/2024)   Received from [facility]   Utilities    Within the past 12 months, have you been unable to get utilities(heat, electricity) when it was really needed?: No  Health Literacy: Not on file    FAMILY HISTORY: Family History  Problem Relation Age of Onset   Cancer Mother        jaw   Hypertension Father    Heart attack Father    Heart attack Sister    Cancer Sister        unknown primary-mets throughout body   Lung cancer Brother    Colon cancer Neg  Hx    Esophageal cancer Neg Hx    Pancreatic cancer Neg Hx    Stomach cancer Neg Hx    Liver disease Neg Hx     ALLERGIES:  is allergic to other, shellfish allergy , venofer  [iron  sucrose], cephalexin , ciprofloxacin, clopidogrel bisulfate, ezetimibe -simvastatin, lidocaine , nitrofurantoin, pregabalin, celecoxib, codeine, codeine phosphate, hydrocod poli-chlorphe poli er, propoxyphene, propoxyphene n-acetaminophen , rofecoxib, sulfa antibiotics, sulfamethoxazole, and sulfonamide derivatives.  MEDICATIONS:  Current Outpatient Medications  Medication Sig Dispense Refill   albuterol  (VENTOLIN  HFA) 108 (90 Base) MCG/ACT inhaler Inhale 2 puffs into the lungs every 6 (six) hours as needed for wheezing or shortness of breath. 8 g 0   Bempedoic Acid -Ezetimibe  (NEXLIZET ) 180-10 MG TABS TAKE 1 TABLET BY MOUTH DAILY 90 tablet 3   Blood Glucose Monitoring Suppl DEVI 1 each by Does not apply route in the morning, at noon, and at bedtime. May substitute to any manufacturer covered by patient's insurance. 1 each 0   Cholecalciferol  (VITAMIN D -3) 1000 UNITS CAPS Take 1 capsule by mouth daily.     CRANBERRY PO Take 1 tablet by mouth daily.     diclofenac  Sodium (VOLTAREN ) 1 % GEL Apply 4 g topically 4 (four) times daily. To your chest wall 350 g 1   erythromycin ophthalmic ointment SMARTSIG:In Eye(s)     ezetimibe  (ZETIA ) 10 MG tablet Take 1 tablet (10 mg total) by mouth daily. 90 tablet 1   ferrous sulfate  325 (65 FE) MG tablet Take 1 tablet (325 mg total) by mouth daily with breakfast. 30 tablet 2   fexofenadine  (ALLEGRA ) 180 MG tablet Take 1 tablet (180 mg total) by mouth as needed for allergies or rhinitis. 90 tablet 0   gabapentin  (NEURONTIN ) 100 MG capsule Take 200 mg by mouth 2 (two) times daily.     glipiZIDE  (GLUCOTROL  XL) 2.5 MG 24 hr tablet Take 1 tablet (2.5 mg total) by mouth daily with breakfast. for diabetes. 90 tablet 0   Glucose Blood (BLOOD GLUCOSE TEST STRIPS) STRP 1 each by In  Vitro route in  the morning, at noon, and at bedtime. May substitute to any manufacturer covered by patient's insurance. 300 strip 5   imatinib  (GLEEVEC ) 400 MG tablet Take 1 tablet (400 mg total) by mouth daily. Take with meals and large glass of water. 30 tablet 1   Lancets Misc. MISC 1 each by Does not apply route in the morning, at noon, and at bedtime. May substitute to any manufacturer covered by patient's insurance. 300 each 5   metoprolol  succinate (TOPROL -XL) 100 MG 24 hr tablet TAKE 1 TABLET BY MOUTH EVERY MORNING WITH OR IMMEDIATELY FOLLOWING A MEAL 90 tablet 3   nitroGLYCERIN  (NITROSTAT ) 0.4 MG SL tablet Place 1 tablet (0.4 mg total) under the tongue every 5 (five) minutes as needed for chest pain. 25 tablet 2   ondansetron  (ZOFRAN -ODT) 4 MG disintegrating tablet Take 1 tablet (4 mg total) by mouth every 8 (eight) hours as needed for nausea or vomiting. 90 tablet 0   oxybutynin  (DITROPAN -XL) 5 MG 24 hr tablet Take 1 tablet (5 mg total) by mouth at bedtime. For overactive bladder 90 tablet 0   pantoprazole  (PROTONIX ) 40 MG tablet Take 1 tablet (40 mg total) by mouth 2 (two) times daily. TAKE 1 TABLET BY MOUTH DAILY FOR HEARTBURN (Patient taking differently: Take 80 mg by mouth daily. TAKE 2 TABLET BY MOUTH DAILY FOR HEARTBURN in am) 60 tablet 12   predniSONE  (STERAPRED UNI-PAK 21 TAB) 10 MG (21) TBPK tablet Prednisone  5 mg- 6 day dose pack as directed. - #1 no refills. 1 each 0   sertraline  (ZOLOFT ) 100 MG tablet TAKE 1 TABLET BY MOUTH DAILY FOR ANXIETY AND DEPRESSION 90 tablet 0   vitamin B-12 (CYANOCOBALAMIN ) 1000 MCG tablet Take 1,000 mcg by mouth every other day.     No current facility-administered medications for this visit.    PHYSICAL EXAMINATION:   Vitals:   11/12/24 1059  BP: 137/81  Pulse: 94  Resp: 16  Temp: 98.2 F (36.8 C)     Filed Weights   11/12/24 1059  Weight: 156 lb 3.2 oz (70.9 kg)    Significant bruising of the right of the face.  Physical  Exam Vitals and nursing note reviewed.  HENT:     Head: Normocephalic and atraumatic.     Mouth/Throat:     Pharynx: Oropharynx is clear.  Eyes:     Extraocular Movements: Extraocular movements intact.     Pupils: Pupils are equal, round, and reactive to light.  Cardiovascular:     Rate and Rhythm: Normal rate and regular rhythm.  Pulmonary:     Comments: Decreased breath sounds bilaterally.  Abdominal:     Palpations: Abdomen is soft.  Musculoskeletal:        General: Normal range of motion.     Cervical back: Normal range of motion.  Skin:    General: Skin is warm.  Neurological:     General: No focal deficit present.     Mental Status: She is alert and oriented to person, place, and time.  Psychiatric:        Behavior: Behavior normal.        Judgment: Judgment normal.     LABORATORY DATA:  I have reviewed the data as listed Lab Results  Component Value Date   WBC 3.6 (L) 11/12/2024   HGB 11.9 (L) 11/12/2024   HCT 35.7 (L) 11/12/2024   MCV 98.6 11/12/2024   PLT 171 11/12/2024   Recent Labs    07/14/24 0539 07/15/24 0513 07/16/24  [id number] 07/17/24 0540 07/23/24 1610 07/24/24 0636 10/01/24 1430 10/09/24 0851 11/12/24 1043  NA 137 139 141   < > 138   < > 133* 138 140  K 4.1 3.8 4.3   < > 3.1*   < > 4.5 4.9 5.0  CL 104 109 107   < > 104   < > 96* 104 105  CO2 27 23 26    < > 24   < > 26 27 26   GLUCOSE 129* 144* 142*   < > 160*   < > 231* 156* 187*  BUN 38* 22 14   < > 10   < > 27* 26* 19  CREATININE 1.31* 1.16* 1.28*   < > 1.09*   < > 1.58* 1.33* 1.21*  CALCIUM 8.2* 8.2* 8.4*   < > 8.4*   < > 8.5* 8.7* 9.4  GFRNONAA 39* 45* 40*   < > 48*   < > 31* 38* 42*  PROT 5.0* 4.9* 5.2*   < > 6.6  --   --  6.1* 6.4*  ALBUMIN 3.0* 3.0* 3.1*   < > 3.5  --   --  3.7 4.1  AST 25 24 23    < > 25  --   --  29 20  ALT 11 11 12    < > 12  --   --  16 12  ALKPHOS 21* 23* 25*   < > 38  --   --  36* 62  BILITOT 0.8 0.8 0.9   < > 0.9  --   --  0.8 0.4  BILIDIR 0.1 0.2 0.2  --   --    --   --   --   --   IBILI 0.7 0.6 0.7  --   --   --   --   --   --    < > = values in this interval not displayed.    RADIOGRAPHIC STUDIES: I have personally reviewed the radiological images as listed and agreed with the findings in the report. No results found.  Assessment and Plan: Gastrointestinal stromal tumor (GIST) of body of stomach (HCC) # OCT 2025- Spindle cell neoplasm consistent with gastrointestinal stromal tumor (GIST), spindle cell type AUG 2025- CT A/P: Large mass within the lumen of the stomach-  Large round well-circumscribed mass in the mid stomach measures 6.3 x 6.3 cm (image 63) mass is contained within lumen of the stomach.. No evidence of metastatic adenopathy in the abdomen pelvis. No liver metastasis. No peritoneal metastasis. CT chest:  Solitary pulmonary nodule in the RIGHT lower lobe is new from 2014. Indeterminate finding. FDG PET scan may aid in determining malignant potential. 2. No mediastinal adenopathy.  PET scan:  6.6 cm gastric mass has diffuse low-grade metabolic activity, 1.0 cm left lower lobe pulmonary nodule has a maximum SUV of 2.5, borderline for malignancy. This nodule was not present on 08/09/2013.   # AUG 2025 - EGD biopsy negative/inconclusive.  SEP 2025- S/p endoscopic ultrasound with biopsy x 2 [Dr.Mansouraty, [place]]- positive for spindle cell neoplasm.  EUS-POSITIVE FOR GIST.  STAGE IB [although-left lower lobe lung nodule unclear etiology]. NGS [facility]- NEG C-kit or PDGFR mutation.  Succinate dehydrogenase IHC pending.    # Of note patient has been on Gleevec  400 mg a day pending above NGS testing.  Patient is tolerating Gleevec  poorly with-facial edema; worsening fatigue; leg swelling dry skin excetra.  And the fact that this is negative for c-kit-PDGFR I think  is reasonable to discontinue Gleevec .   # Will consider alternative tyrosine kinase inhibitor-like Sutent.  However response rates are much modest compared to Gleevec  with typical GIST tumor.   Since this is localized to stomach-I think is reasonable to consider surgery.  Discussed with patient.      # Severe anemia-second iron  deficiency/c acute on hronic GI bleed secondary to #1-today hemoglobin is 12. -iron  infusion discontinued because of grade 4 anaphylactic reaction.  Continue oral iron  once a day.  Today hemoglobin is 13 monitor for now.   # History of A-fib - OFF Eliquis -[Dr.Gollan]- s/p cardiology- consider Watchman device- defer to cardiology.  Will reach out to cardioogy.    # Anaphylaxis with Venofer -    # DISPOSITION: # No blood- cancel tomorrow appt # follow up in next 4  weeks- MD; labs- cbc-CMP; HOLD tube- D-2 possible 1 unit- Blood- Dr.B   Addendum: Given limited options-I reached out to patient and Burnard -recommend surgical evaluation either at [facility] or [facility].SABRA  LVM for pt to call to discuss. Kristie-please reach out to patient/niece.        Above plan of care was discussed with patient/family in detail.  My contact information was given to the patient/family.     Morna Husband, NP 11/12/2024 1:49 PM

## 2024-11-13 ENCOUNTER — Encounter: Payer: Self-pay | Admitting: Internal Medicine

## 2024-11-14 ENCOUNTER — Inpatient Hospital Stay

## 2024-11-19 ENCOUNTER — Ambulatory Visit (INDEPENDENT_AMBULATORY_CARE_PROVIDER_SITE_OTHER): Admitting: Primary Care

## 2024-11-19 ENCOUNTER — Encounter: Payer: Self-pay | Admitting: Primary Care

## 2024-11-19 ENCOUNTER — Ambulatory Visit: Payer: Self-pay | Admitting: Primary Care

## 2024-11-19 VITALS — BP 124/70 | HR 68 | Temp 97.8°F | Ht 69.0 in | Wt 161.8 lb

## 2024-11-19 DIAGNOSIS — F32A Depression, unspecified: Secondary | ICD-10-CM | POA: Diagnosis not present

## 2024-11-19 DIAGNOSIS — E782 Mixed hyperlipidemia: Secondary | ICD-10-CM

## 2024-11-19 DIAGNOSIS — N1832 Chronic kidney disease, stage 3b: Secondary | ICD-10-CM | POA: Diagnosis not present

## 2024-11-19 DIAGNOSIS — Z0000 Encounter for general adult medical examination without abnormal findings: Secondary | ICD-10-CM | POA: Diagnosis not present

## 2024-11-19 DIAGNOSIS — E1122 Type 2 diabetes mellitus with diabetic chronic kidney disease: Secondary | ICD-10-CM | POA: Diagnosis not present

## 2024-11-19 DIAGNOSIS — N3281 Overactive bladder: Secondary | ICD-10-CM

## 2024-11-19 DIAGNOSIS — K219 Gastro-esophageal reflux disease without esophagitis: Secondary | ICD-10-CM | POA: Diagnosis not present

## 2024-11-19 DIAGNOSIS — I10 Essential (primary) hypertension: Secondary | ICD-10-CM | POA: Diagnosis not present

## 2024-11-19 DIAGNOSIS — C49A2 Gastrointestinal stromal tumor of stomach: Secondary | ICD-10-CM | POA: Diagnosis not present

## 2024-11-19 DIAGNOSIS — I4819 Other persistent atrial fibrillation: Secondary | ICD-10-CM | POA: Diagnosis not present

## 2024-11-19 DIAGNOSIS — F419 Anxiety disorder, unspecified: Secondary | ICD-10-CM

## 2024-11-19 DIAGNOSIS — D509 Iron deficiency anemia, unspecified: Secondary | ICD-10-CM

## 2024-11-19 DIAGNOSIS — I2510 Atherosclerotic heart disease of native coronary artery without angina pectoris: Secondary | ICD-10-CM

## 2024-11-19 LAB — LIPID PANEL
Cholesterol: 115 mg/dL (ref 28–200)
HDL: 34.9 mg/dL — ABNORMAL LOW
LDL Cholesterol: 44 mg/dL (ref 10–99)
NonHDL: 80.5
Total CHOL/HDL Ratio: 3
Triglycerides: 184 mg/dL — ABNORMAL HIGH (ref 10.0–149.0)
VLDL: 36.8 mg/dL (ref 0.0–40.0)

## 2024-11-19 LAB — HEMOGLOBIN A1C: Hgb A1c MFr Bld: 8.5 % — ABNORMAL HIGH (ref 4.6–6.5)

## 2024-11-19 NOTE — Progress Notes (Signed)
 "  Subjective:    Patient ID: [name] [name], female    DOB: 09-Mar-1932, 89 y.o.   MRN: [id number]  [name] [name] is a very pleasant 89 y.o. female who presents today for complete physical and follow up of chronic conditions.  Immunizations: -Tetanus: Completed in 2000 -Influenza: completed this season  -Shingles: Completed Zostavax -Pneumonia: Completed last in 2021   Diet: Fair diet.  Exercise: No regular exercise.  Eye exam: Completes annually  Dental exam: Completes semi-annually     Mammogram: Completed in June 2025 Bone Density Scan: Completed in 2021, she is scheduled.   Colonoscopy: Completed in 2024  BP Readings from Last 3 Encounters:  11/19/24 124/70  11/12/24 137/81  10/17/24 130/80       Review of Systems  Constitutional:  Negative for unexpected weight change.  HENT:  Negative for rhinorrhea.   Respiratory:  Negative for cough and shortness of breath.   Cardiovascular:  Negative for chest pain.  Gastrointestinal:  Negative for constipation and diarrhea.  Genitourinary:  Negative for difficulty urinating.  Musculoskeletal:  Negative for arthralgias and myalgias.  Skin:  Negative for rash.  Allergic/Immunologic: Negative for environmental allergies.  Neurological:  Negative for dizziness and headaches.  Psychiatric/Behavioral:  The patient is not nervous/anxious.          Past Medical History:  Diagnosis Date   Acute bilateral thoracic back pain 08/16/2019   Acute post-hemorrhagic anemia 10/10/2023   Allergy     Cipro, Plavix, Statins   Anemia    Arthritis    Atrial fibrillation (HCC)    CAD (coronary artery disease)    a. s/p tandem Promus DES to LAD in 2009 by Dr. Obie;  b.  LHC (03/22/14):  prox LAD 30%, mid LAD 40%, LAD stent ok with dist 20% ISR, apical LAD occluded with L-L collats filling apical vessel (too small for PCI), mid RCA 20%, EF 70%.  Med Rx   Cancer [facility])    Chronic kidney disease, stage 3b (HCC)    Colon polyps     Constipation 12/30/2014   Diabetes mellitus    Diagnosed 2012   Diverticulosis of colon (without mention of hemorrhage)    DVT (deep venous thrombosis) (HCC) 12/2022   right leg   Elevated LFTs 04/19/2014   Gastric tumor 08/30/2024   Gastric ulcer 07/23/2024   GERD (gastroesophageal reflux disease)    GI bleed 07/12/2024   Glaucoma    Grade I diastolic dysfunction    Grade I diastolic dysfunction    Herpes zoster without complication 04/24/2020   Hypercoagulable state due to persistent atrial fibrillation (HCC) 08/17/2023   Hyperlipidemia    intol of statins   Hypertension    Kidney stones    Left atrial dilation    Moderate aortic regurgitation    Motion sickness    back seat cars   Nausea and vomiting 07/12/2024   NHL (non-Hodgkin's lymphoma) (HCC) dx'd 2003   chemo/xrt comp 2003   Personal history of colonic polyps    Proctitis    Pruritic intertrigo 07/18/2014   Syncope and collapse 10/09/2023   Urticaria    Wears dentures    Full upper, partial lower   Wears hearing aid in both ears     Social History   Socioeconomic History   Marital status: Widowed    Spouse name: Not on file   Number of children: 0   Years of education: Not on file   Highest education level: 12th grade  Occupational  History   Occupation: Retired    Associate Professor: RETIRED   Occupation: retired  Tobacco Use   Smoking status: Never   Smokeless tobacco: Never  Vaping Use   Vaping status: Never Used  Substance and Sexual Activity   Alcohol use: No   Drug use: No   Sexual activity: Not Currently  Other Topics Concern   Not on file  Social History Narrative   Not on file   Social Drivers of Health   Tobacco Use: Low Risk (11/19/2024)   Patient History    Smoking Tobacco Use: Never    Smokeless Tobacco Use: Never    Passive Exposure: Not on file  Financial Resource Strain: Low Risk (09/24/2024)   Overall Financial Resource Strain (CARDIA)    Difficulty of Paying Living Expenses: Not  hard at all  Food Insecurity: No Food Insecurity (11/02/2024)   Received from [facility]   Epic    Within the past 12 months, you worried that your food would run out before you got the money to buy more.: Never true    Within the past 12 months, the food you bought just didn't last and you didn't have money to get more.: Never true  Transportation Needs: No Transportation Needs (11/02/2024)   Received from [facility] - Transportation    Lack of Transportation (Medical): No    Lack of Transportation (Non-Medical): No  Physical Activity: Inactive (09/24/2024)   Exercise Vital Sign    Days of Exercise per Week: 0 days    Minutes of Exercise per Session: Not on file  Stress: No Stress Concern Present (09/24/2024)   [employer] of Occupational [employer] - Occupational Stress Questionnaire    Feeling of Stress: Not at all  Social Connections: Moderately Isolated (09/24/2024)   Social Connection and Isolation Panel    Frequency of Communication with Friends and Family: More than three times a week    Frequency of Social Gatherings with Friends and Family: Once a week    Attends Religious Services: More than 4 times per year    Active Member of [employer] or Organizations: No    Attends Banker Meetings: Not on file    Marital Status: Widowed  Intimate Partner Violence: Not At Risk (07/23/2024)   Epic    Fear of Current or Ex-Partner: No    Emotionally Abused: No    Physically Abused: No    Sexually Abused: No  Depression (PHQ2-9): Low Risk (11/12/2024)   Depression (PHQ2-9)    PHQ-2 Score: 0  Alcohol Screen: Not on file  Housing: Low Risk (09/24/2024)   Epic    Unable to Pay for Housing in the Last Year: No    Number of Times Moved in the Last Year: 1    Homeless in the Last Year: No  Utilities: Low Risk (11/02/2024)   Received from [facility]   Utilities    Within the past 12 months, have you been unable to get utilities(heat, electricity) when  it was really needed?: No  Health Literacy: Not on file    Past Surgical History:  Procedure Laterality Date   BIOPSY  10/15/2023   Procedure: BIOPSY;  Surgeon: Aundria, Ladell POUR, MD;  Location: [facility] ENDOSCOPY;  Service: Gastroenterology;;   cardiac cath-neg     CATARACT EXTRACTION Bilateral    COLONOSCOPY WITH PROPOFOL  N/A 10/15/2023   Procedure: COLONOSCOPY WITH PROPOFOL ;  Surgeon: Toledo, Ladell POUR, MD;  Location: [facility] ENDOSCOPY;  Service: Gastroenterology;  Laterality: N/A;   CORONARY ANGIOPLASTY WITH STENT PLACEMENT     x 2   dexa-neg     ESOPHAGOGASTRODUODENOSCOPY N/A 07/13/2024   Procedure: EGD (ESOPHAGOGASTRODUODENOSCOPY);  Surgeon: Jinny Carmine, MD;  Location: [facility] ENDOSCOPY;  Service: Endoscopy;  Laterality: N/A;   ESOPHAGOGASTRODUODENOSCOPY N/A 07/15/2024   Procedure: EGD (ESOPHAGOGASTRODUODENOSCOPY);  Surgeon: Onita Elspeth Sharper, DO;  Location: [facility] ENDOSCOPY;  Service: Gastroenterology;  Laterality: N/A;   ESOPHAGOGASTRODUODENOSCOPY N/A 08/09/2024   Procedure: EGD (ESOPHAGOGASTRODUODENOSCOPY);  Surgeon: Wilhelmenia Aloha Raddle., MD;  Location: [facility] ENDOSCOPY;  Service: Gastroenterology;  Laterality: N/A;   ESOPHAGOGASTRODUODENOSCOPY N/A 08/30/2024   Procedure: EGD (ESOPHAGOGASTRODUODENOSCOPY);  Surgeon: Wilhelmenia Aloha Raddle., MD;  Location: [facility] ENDOSCOPY;  Service: Gastroenterology;  Laterality: N/A;   ESOPHAGOGASTRODUODENOSCOPY (EGD) WITH PROPOFOL  N/A 10/15/2023   Procedure: ESOPHAGOGASTRODUODENOSCOPY (EGD) WITH PROPOFOL ;  Surgeon: Toledo, Ladell POUR, MD;  Location: [facility] ENDOSCOPY;  Service: Gastroenterology;  Laterality: N/A;   EUS N/A 08/09/2024   Procedure: ULTRASOUND, UPPER GI TRACT, ENDOSCOPIC;  Surgeon: Wilhelmenia Aloha Raddle., MD;  Location: [facility] ENDOSCOPY;  Service: Gastroenterology;  Laterality: N/A;   EUS N/A 08/30/2024   Procedure: ULTRASOUND, UPPER GI TRACT, ENDOSCOPIC;  Surgeon: Wilhelmenia Aloha Raddle., MD;  Location: [facility] ENDOSCOPY;  Service: Gastroenterology;   Laterality: N/A;   FINE NEEDLE ASPIRATION  08/09/2024   Procedure: FINE NEEDLE ASPIRATION;  Surgeon: Wilhelmenia Aloha Raddle., MD;  Location: [facility] ENDOSCOPY;  Service: Gastroenterology;;   FINE NEEDLE ASPIRATION  08/30/2024   Procedure: FINE NEEDLE ASPIRATION;  Surgeon: Wilhelmenia Aloha Raddle., MD;  Location: [facility] ENDOSCOPY;  Service: Gastroenterology;;   HEMOSTASIS CLIP PLACEMENT  10/15/2023   Procedure: HEMOSTASIS CLIP PLACEMENT;  Surgeon: Aundria, Ladell POUR, MD;  Location: [facility] ENDOSCOPY;  Service: Gastroenterology;;   KNEE SURGERY  10/30/2003   left   LEFT HEART CATHETERIZATION WITH CORONARY ANGIOGRAM N/A 03/22/2014   Procedure: LEFT HEART CATHETERIZATION WITH CORONARY ANGIOGRAM;  Surgeon: Lonni JONETTA Cash, MD;  Location: [facility] CATH LAB;  Service: Cardiovascular;  Laterality: N/A;   POLYPECTOMY  10/15/2023   Procedure: POLYPECTOMY;  Surgeon: Toledo, Ladell POUR, MD;  Location: [facility] ENDOSCOPY;  Service: Gastroenterology;;   stress cardiolite      VAGINAL HYSTERECTOMY     partial, fibroids, one ovary left    Family History  Problem Relation Age of Onset   Cancer Mother        jaw   Hypertension Father    Heart attack Father    Heart attack Sister    Cancer Sister        unknown primary-mets throughout body   Lung cancer Brother    Colon cancer Neg Hx    Esophageal cancer Neg Hx    Pancreatic cancer Neg Hx    Stomach cancer Neg Hx    Liver disease Neg Hx     Allergies[1]  Medications Ordered Prior to Encounter[2]  BP 124/70 (BP Location: Left Arm, Patient Position: Sitting, Cuff Size: Large)   Pulse 68   Temp 97.8 F (36.6 C) (Temporal)   Ht 5' 9 (1.753 m)   Wt 161 lb 12.8 oz (73.4 kg)   SpO2 98%   BMI 23.89 kg/m  Objective:   Physical Exam HENT:     Right Ear: Tympanic membrane and ear canal normal.     Left Ear: Tympanic membrane and ear canal normal.  Eyes:     Pupils: Pupils are equal, round, and reactive to light.  Cardiovascular:     Rate and Rhythm: Normal  rate and regular rhythm.  Pulmonary:  Effort: Pulmonary effort is normal.     Breath sounds: Normal breath sounds.  Abdominal:     General: Bowel sounds are normal.     Palpations: Abdomen is soft.     Tenderness: There is no abdominal tenderness.  Musculoskeletal:        General: Normal range of motion.     Cervical back: Neck supple.  Skin:    General: Skin is warm and dry.  Neurological:     Mental Status: She is alert and oriented to person, place, and time.     Cranial Nerves: No cranial nerve deficit.     Deep Tendon Reflexes:     Reflex Scores:      Patellar reflexes are 2+ on the right side and 2+ on the left side. Psychiatric:        Mood and Affect: Mood normal.     Physical Exam        Assessment & Plan:  Persistent atrial fibrillation (HCC) Assessment & Plan: Rate controlled.  Remain off Eliquis  5 mg BID due to high risk for bleeding. Continue metoprolol  succinate 100 mg daily.   Following with cardiology, office notes reviewed from September 2025.   Essential hypertension Assessment & Plan: Controlled.   Continue metoprolol  succinate 100 mg daily.   Coronary artery disease involving native coronary artery of native heart without angina pectoris Assessment & Plan: Asymptomatic.  Following with cardiology, office notes reviewed from September 2025.   Orders: -     Lipid panel  Gastroesophageal reflux disease, unspecified whether esophagitis present Assessment & Plan: Stable.  Continue pantoprazole  40 mg twice daily.   Gastrointestinal stromal tumor (GIST) of body of stomach (HCC) Assessment & Plan: Following with oncology and surgical oncology. Proceed with surgery as scheduled.   Remain off Gleevec .    Type 2 diabetes mellitus with stage 3b chronic kidney disease, without long-term current use of insulin  (HCC) Assessment & Plan: Repeat A1C pending.  Continue glipizide  2.5 mg daily. Continue to monitor.   Orders: -      Hemoglobin A1c  Overactive bladder Assessment & Plan: Improved.  Continue oxybutynin  XL 5 mg daily.   Preventative health care Assessment & Plan: Immunizations UTD.  Mammogram UTD. Bone density scan due, she will schedule with [facility] UTD  Discussed the importance of a healthy diet and regular exercise in order for weight loss, and to reduce the risk of further co-morbidity.  Exam stable. Labs pending.  Follow up in 1 year for repeat physical.    Mixed hyperlipidemia Assessment & Plan: Repeat lipid panel pending.  Work on a healthy diet and regular exercise in order for weight loss, and to reduce the risk of further co-morbidity.  Continue Zetia  10 mg daily.   Iron  deficiency anemia, unspecified iron  deficiency anemia type Assessment & Plan: Following with hematology. Labs reviewed from December 2025   Anxiety and depression Assessment & Plan: Controlled.  Continue Zoloft  100 mg daily     Assessment and Plan Assessment & Plan         Comer MARLA Gaskins, NP       [1]  Allergies Allergen Reactions   Other Rash and Anaphylaxis   Shellfish Allergy  Anaphylaxis and Rash   Venofer  [Iron  Sucrose] Anaphylaxis    Severe anaphylactic reaction to Venofer -needing chest compressions; EpiPen .    Cephalexin      REACTION: trash and swelling   Ciprofloxacin     REACTION: u/k   Clopidogrel Bisulfate     REACTION:  swelling, rash   Ezetimibe -Simvastatin     REACTION: myalgias   Lidocaine  Hives    ONLY TO ADHESIVE LIDOCAINE  PATCHES. NO PROBLEM WITH INJECTABLE LIDOCAINE .   Nitrofurantoin     REACTION: uk   Pregabalin     REACTION: felt bad   Celecoxib Rash    REACTION: uk   Codeine Rash   Codeine Phosphate Rash   Hydrocod Poli-Chlorphe Poli Er Rash   Propoxyphene Rash   Propoxyphene N-Acetaminophen  Rash   Rofecoxib Rash   Sulfa Antibiotics Rash   Sulfamethoxazole Rash   Sulfonamide Derivatives Rash  [2]  Current Outpatient  Medications on File Prior to Visit  Medication Sig Dispense Refill   albuterol  (VENTOLIN  HFA) 108 (90 Base) MCG/ACT inhaler Inhale 2 puffs into the lungs every 6 (six) hours as needed for wheezing or shortness of breath. 8 g 0   Bempedoic Acid -Ezetimibe  (NEXLIZET ) 180-10 MG TABS TAKE 1 TABLET BY MOUTH DAILY 90 tablet 3   Blood Glucose Monitoring Suppl DEVI 1 each by Does not apply route in the morning, at noon, and at bedtime. May substitute to any manufacturer covered by patient's insurance. 1 each 0   Cholecalciferol  (VITAMIN D -3) 1000 UNITS CAPS Take 1 capsule by mouth daily.     CRANBERRY PO Take 1 tablet by mouth daily.     diclofenac  Sodium (VOLTAREN ) 1 % GEL Apply 4 g topically 4 (four) times daily. To your chest wall 350 g 1   erythromycin ophthalmic ointment SMARTSIG:In Eye(s)     ezetimibe  (ZETIA ) 10 MG tablet Take 1 tablet (10 mg total) by mouth daily. 90 tablet 1   ferrous sulfate  325 (65 FE) MG tablet Take 1 tablet (325 mg total) by mouth daily with breakfast. 30 tablet 2   fexofenadine  (ALLEGRA ) 180 MG tablet Take 1 tablet (180 mg total) by mouth as needed for allergies or rhinitis. 90 tablet 0   gabapentin  (NEURONTIN ) 100 MG capsule Take 200 mg by mouth 2 (two) times daily.     glipiZIDE  (GLUCOTROL  XL) 2.5 MG 24 hr tablet Take 1 tablet (2.5 mg total) by mouth daily with breakfast. for diabetes. 90 tablet 0   Glucose Blood (BLOOD GLUCOSE TEST STRIPS) STRP 1 each by In Vitro route in the morning, at noon, and at bedtime. May substitute to any manufacturer covered by patient's insurance. 300 strip 5   imatinib  (GLEEVEC ) 400 MG tablet Take 1 tablet (400 mg total) by mouth daily. Take with meals and large glass of water. 30 tablet 1   Lancets Misc. MISC 1 each by Does not apply route in the morning, at noon, and at bedtime. May substitute to any manufacturer covered by patient's insurance. 300 each 5   metoprolol  succinate (TOPROL -XL) 100 MG 24 hr tablet TAKE 1 TABLET BY MOUTH EVERY  MORNING WITH OR IMMEDIATELY FOLLOWING A MEAL 90 tablet 3   nitroGLYCERIN  (NITROSTAT ) 0.4 MG SL tablet Place 1 tablet (0.4 mg total) under the tongue every 5 (five) minutes as needed for chest pain. 25 tablet 2   ondansetron  (ZOFRAN -ODT) 4 MG disintegrating tablet Take 1 tablet (4 mg total) by mouth every 8 (eight) hours as needed for nausea or vomiting. 90 tablet 0   oxybutynin  (DITROPAN -XL) 5 MG 24 hr tablet Take 1 tablet (5 mg total) by mouth at bedtime. For overactive bladder 90 tablet 0   pantoprazole  (PROTONIX ) 40 MG tablet Take 1 tablet (40 mg total) by mouth 2 (two) times daily. TAKE 1 TABLET BY MOUTH DAILY FOR HEARTBURN (Patient  taking differently: Take 80 mg by mouth daily. TAKE 2 TABLET BY MOUTH DAILY FOR HEARTBURN in am) 60 tablet 12   predniSONE  (STERAPRED UNI-PAK 21 TAB) 10 MG (21) TBPK tablet Prednisone  5 mg- 6 day dose pack as directed. - #1 no refills. 1 each 0   sertraline  (ZOLOFT ) 100 MG tablet TAKE 1 TABLET BY MOUTH DAILY FOR ANXIETY AND DEPRESSION 90 tablet 0   vitamin B-12 (CYANOCOBALAMIN ) 1000 MCG tablet Take 1,000 mcg by mouth every other day.     [DISCONTINUED] loratadine  (CLARITIN ) 10 MG tablet Take 10 mg by mouth as needed.      No current facility-administered medications on file prior to visit.   "

## 2024-11-19 NOTE — Assessment & Plan Note (Signed)
Improved. Continue oxybutynin XL 5 mg daily.

## 2024-11-19 NOTE — Assessment & Plan Note (Signed)
Controlled.  Continue metoprolol succinate 100 mg daily

## 2024-11-19 NOTE — Assessment & Plan Note (Signed)
 Asymptomatic.  Following with cardiology, office notes reviewed from September 2025.

## 2024-11-19 NOTE — Assessment & Plan Note (Signed)
 Following with hematology. Labs reviewed from December 2025

## 2024-11-19 NOTE — Patient Instructions (Signed)
 Stop by the lab prior to leaving today. I will notify you of your results once received.   Please schedule a follow up visit for 6 months for a diabetes check.  It was a pleasure to see you today!

## 2024-11-19 NOTE — Assessment & Plan Note (Signed)
Stable.  Continue pantoprazole 40mg  twice daily.

## 2024-11-19 NOTE — Assessment & Plan Note (Signed)
 Controlled.  Continue Zoloft  100 mg daily

## 2024-11-19 NOTE — Assessment & Plan Note (Signed)
 Repeat A1C pending.  Continue glipizide  2.5 mg daily. Continue to monitor.

## 2024-11-19 NOTE — Assessment & Plan Note (Signed)
 Immunizations UTD.  Mammogram UTD. Bone density scan due, she will schedule with [facility] UTD  Discussed the importance of a healthy diet and regular exercise in order for weight loss, and to reduce the risk of further co-morbidity.  Exam stable. Labs pending.  Follow up in 1 year for repeat physical.

## 2024-11-19 NOTE — Assessment & Plan Note (Signed)
 Following with oncology and surgical oncology. Proceed with surgery as scheduled.   Remain off Gleevec .

## 2024-11-19 NOTE — Assessment & Plan Note (Signed)
 Repeat lipid panel pending.  Work on a healthy diet and regular exercise in order for weight loss, and to reduce the risk of further co-morbidity. Continue Zetia  10 mg daily.

## 2024-11-19 NOTE — Assessment & Plan Note (Signed)
 Rate controlled.  Remain off Eliquis  5 mg BID due to high risk for bleeding. Continue metoprolol  succinate 100 mg daily.   Following with cardiology, office notes reviewed from September 2025.

## 2024-11-23 ENCOUNTER — Encounter: Admitting: Primary Care

## 2024-11-23 MED ORDER — GLIPIZIDE ER 5 MG PO TB24: 5.0000 mg | ORAL_TABLET | Freq: Every day | ORAL | 0 refills | Status: AC

## 2024-11-25 NOTE — Progress Notes (Unsigned)
 Cardiology Office Note  Date:  11/26/2024   ID:  [name] [name], DOB [birth year], MRN [id number]  PCP:  Gretta Comer POUR, NP   Chief Complaint  Patient presents with   Follow-up    Pt Friday had some chest pain after that she doing okay.    HPI:  [name] [name] is a 89 yo female with history of  CAD,s/p PCI 2009 tandem non-overlapping drug-eluting stents placed in the LAD cardiac cath April 2015 with patent LAD stents, occluded apical LAD segment HLD, HTN,  non-Hodgkin's lymphoma,  DM  aortic valve insufficiency  Statin intolerance Echo 11/24: EF 60% Who presents for f/u of her CAD, hx of PCI, long-term persistent atrial fibrillation 9/24  Last seen by myself in clinic 07/2024  Chest pain last Friday: couple minutes, resolved without intervention Pain was bilateral right and left Etiology unclear but atypical  12/18/24: Scheduled for GI surgery at [facility],  cancer resection, gastric tumor Chemo not working  No further GI bleeding  Gastric tumor within the lumen of the stomach Measuring 6.3 cm  Feels asymptomatic from her atrial fibrillation Rate well-controlled  No significant lower extremity edema  EKG personally reviewed by myself on todays visit EKG Interpretation Date/Time:  Monday November 26 2024 14:44:09 EST Ventricular Rate:  92 PR Interval:    QRS Duration:  74 QT Interval:  370 QTC Calculation: 457 R Axis:   7  Text Interpretation: Atrial fibrillation Inferior infarct , age undetermined Cannot rule out Anterior infarct , age undetermined When compared with ECG of 22-Aug-2024 18:26, No significant change was found Confirmed by Perla Lye [phone]) on 11/26/2024 3:10:06 PM   Echo 01/20/16 with normal LV size and function, trivial aortic valve insufficiency.   bilateral foot pain and normal ABI in 2016.  nuclear stress test October 2017 with no ischemia.   Echo May 2021 with LVEF=60-65%. Mild AI.  Echo May 2023 with LVEF=60-65%, mild LVH. Mild AI.     12/17/22  swelling in her right leg.  Venous dopplers with right LE DVT.   started on Eliquis .  She was seen in our office 12/24/22 and reported dark black stools. She was sent to the ED and her Hgb was down to 10 from 11 on 12/20/22. Positive hemoccult stool card. Her Eliquis  was lowered to 5 mg po BID.   [facility] on 10/09/23 following a syncopal event while standing up from the toilet.  several days of epistaxis prior to admission. Troponin negative. During her hospital stay her Hgb dropped from 9.2 to 6.7. She was started on Iv Protonix  and transfused 2 units of pRBCs. She underwent EGD and colonoscopy.  EGD revealed a normal esophagus, multiple gastric polyps which were biopsied.  Colonoscopy revealed nonbleeding internal hemorrhoids, mild diverticulosis without acute diverticulitis or diverticular bleeding.  She had a 19 mm polyp in the proximal ascending colon which was removed, resected  *Echo 10/11/23 with LVEF=60-65%. Trivial AI. Her syncope was felt to be due to her anemia and orthostasis from dehydration.   Lab work reviewed Total cholesterol 111 LDL 35 A1C 6.1 HGB 11.8    PMH:   has a past medical history of Acute bilateral thoracic back pain (08/16/2019), Acute post-hemorrhagic anemia (10/10/2023), Allergy , Anemia, Arthritis, Atrial fibrillation (HCC), CAD (coronary artery disease), Cancer (HCC), Chronic kidney disease, stage 3b (HCC), Colon polyps, Constipation (12/30/2014), Diabetes mellitus, Diverticulosis of colon (without mention of hemorrhage), DVT (deep venous thrombosis) (HCC) (12/2022), Elevated LFTs (04/19/2014), Gastric tumor (08/30/2024), Gastric ulcer (07/23/2024), GERD (gastroesophageal  reflux disease), GI bleed (07/12/2024), Glaucoma, Grade I diastolic dysfunction, Grade I diastolic dysfunction, Herpes zoster without complication (04/24/2020), Hypercoagulable state due to persistent atrial fibrillation (HCC) (08/17/2023), Hyperlipidemia, Hypertension, Kidney stones, Left  atrial dilation, Moderate aortic regurgitation, Motion sickness, Nausea and vomiting (07/12/2024), NHL (non-Hodgkin's lymphoma) (HCC) (dx'd 2003), Personal history of colonic polyps, Proctitis, Pruritic intertrigo (07/18/2014), Syncope and collapse (10/09/2023), Urticaria, Wears dentures, and Wears hearing aid in both ears.  PSH:    Past Surgical History:  Procedure Laterality Date   BIOPSY  10/15/2023   Procedure: BIOPSY;  Surgeon: Aundria, Ladell POUR, MD;  Location: [facility] ENDOSCOPY;  Service: Gastroenterology;;   cardiac cath-neg     CATARACT EXTRACTION Bilateral    COLONOSCOPY WITH PROPOFOL  N/A 10/15/2023   Procedure: COLONOSCOPY WITH PROPOFOL ;  Surgeon: Toledo, Ladell POUR, MD;  Location: [facility] ENDOSCOPY;  Service: Gastroenterology;  Laterality: N/A;   CORONARY ANGIOPLASTY WITH STENT PLACEMENT     x 2   dexa-neg     ESOPHAGOGASTRODUODENOSCOPY N/A 07/13/2024   Procedure: EGD (ESOPHAGOGASTRODUODENOSCOPY);  Surgeon: Jinny Carmine, MD;  Location: [facility] ENDOSCOPY;  Service: Endoscopy;  Laterality: N/A;   ESOPHAGOGASTRODUODENOSCOPY N/A 07/15/2024   Procedure: EGD (ESOPHAGOGASTRODUODENOSCOPY);  Surgeon: Onita Elspeth Sharper, DO;  Location: [facility] ENDOSCOPY;  Service: Gastroenterology;  Laterality: N/A;   ESOPHAGOGASTRODUODENOSCOPY N/A 08/09/2024   Procedure: EGD (ESOPHAGOGASTRODUODENOSCOPY);  Surgeon: Wilhelmenia Aloha Raddle., MD;  Location: [facility] ENDOSCOPY;  Service: Gastroenterology;  Laterality: N/A;   ESOPHAGOGASTRODUODENOSCOPY N/A 08/30/2024   Procedure: EGD (ESOPHAGOGASTRODUODENOSCOPY);  Surgeon: Wilhelmenia Aloha Raddle., MD;  Location: [facility] ENDOSCOPY;  Service: Gastroenterology;  Laterality: N/A;   ESOPHAGOGASTRODUODENOSCOPY (EGD) WITH PROPOFOL  N/A 10/15/2023   Procedure: ESOPHAGOGASTRODUODENOSCOPY (EGD) WITH PROPOFOL ;  Surgeon: Toledo, Ladell POUR, MD;  Location: [facility] ENDOSCOPY;  Service: Gastroenterology;  Laterality: N/A;   EUS N/A 08/09/2024   Procedure: ULTRASOUND, UPPER GI TRACT, ENDOSCOPIC;  Surgeon:  Wilhelmenia Aloha Raddle., MD;  Location: [facility] ENDOSCOPY;  Service: Gastroenterology;  Laterality: N/A;   EUS N/A 08/30/2024   Procedure: ULTRASOUND, UPPER GI TRACT, ENDOSCOPIC;  Surgeon: Wilhelmenia Aloha Raddle., MD;  Location: [facility] ENDOSCOPY;  Service: Gastroenterology;  Laterality: N/A;   FINE NEEDLE ASPIRATION  08/09/2024   Procedure: FINE NEEDLE ASPIRATION;  Surgeon: Wilhelmenia Aloha Raddle., MD;  Location: [facility] ENDOSCOPY;  Service: Gastroenterology;;   FINE NEEDLE ASPIRATION  08/30/2024   Procedure: FINE NEEDLE ASPIRATION;  Surgeon: Wilhelmenia Aloha Raddle., MD;  Location: [facility] ENDOSCOPY;  Service: Gastroenterology;;   HEMOSTASIS CLIP PLACEMENT  10/15/2023   Procedure: HEMOSTASIS CLIP PLACEMENT;  Surgeon: Aundria, Ladell POUR, MD;  Location: [facility] ENDOSCOPY;  Service: Gastroenterology;;   KNEE SURGERY  10/30/2003   left   LEFT HEART CATHETERIZATION WITH CORONARY ANGIOGRAM N/A 03/22/2014   Procedure: LEFT HEART CATHETERIZATION WITH CORONARY ANGIOGRAM;  Surgeon: Lonni JONETTA Cash, MD;  Location: [facility] CATH LAB;  Service: Cardiovascular;  Laterality: N/A;   POLYPECTOMY  10/15/2023   Procedure: POLYPECTOMY;  Surgeon: Toledo, Ladell POUR, MD;  Location: [facility] ENDOSCOPY;  Service: Gastroenterology;;   stress cardiolite      VAGINAL HYSTERECTOMY     partial, fibroids, one ovary left    Current Outpatient Medications  Medication Sig Dispense Refill   albuterol  (VENTOLIN  HFA) 108 (90 Base) MCG/ACT inhaler Inhale 2 puffs into the lungs every 6 (six) hours as needed for wheezing or shortness of breath. 8 g 0   Bempedoic Acid -Ezetimibe  (NEXLIZET ) 180-10 MG TABS TAKE 1 TABLET BY MOUTH DAILY 90 tablet 3   Blood Glucose Monitoring Suppl DEVI 1 each by Does not apply route in the morning,  at noon, and at bedtime. May substitute to any manufacturer covered by patient's insurance. 1 each 0   Cholecalciferol  (VITAMIN D -3) 1000 UNITS CAPS Take 1 capsule by mouth daily.     CRANBERRY PO Take 1 tablet by mouth daily.      diclofenac  Sodium (VOLTAREN ) 1 % GEL Apply 4 g topically 4 (four) times daily. To your chest wall 350 g 1   erythromycin ophthalmic ointment SMARTSIG:In Eye(s)     ezetimibe  (ZETIA ) 10 MG tablet Take 1 tablet (10 mg total) by mouth daily. 90 tablet 1   ferrous sulfate  325 (65 FE) MG tablet Take 1 tablet (325 mg total) by mouth daily with breakfast. 30 tablet 2   fexofenadine  (ALLEGRA ) 180 MG tablet Take 1 tablet (180 mg total) by mouth as needed for allergies or rhinitis. 90 tablet 0   gabapentin  (NEURONTIN ) 100 MG capsule Take 200 mg by mouth 2 (two) times daily.     glipiZIDE  (GLUCOTROL  XL) 5 MG 24 hr tablet Take 1 tablet (5 mg total) by mouth daily with breakfast. for diabetes. 90 tablet 0   Glucose Blood (BLOOD GLUCOSE TEST STRIPS) STRP 1 each by In Vitro route in the morning, at noon, and at bedtime. May substitute to any manufacturer covered by patient's insurance. 300 strip 5   Lancets Misc. MISC 1 each by Does not apply route in the morning, at noon, and at bedtime. May substitute to any manufacturer covered by patient's insurance. 300 each 5   metoprolol  succinate (TOPROL -XL) 100 MG 24 hr tablet TAKE 1 TABLET BY MOUTH EVERY MORNING WITH OR IMMEDIATELY FOLLOWING A MEAL 90 tablet 3   nitroGLYCERIN  (NITROSTAT ) 0.4 MG SL tablet Place 1 tablet (0.4 mg total) under the tongue every 5 (five) minutes as needed for chest pain. 25 tablet 2   ondansetron  (ZOFRAN -ODT) 4 MG disintegrating tablet Take 1 tablet (4 mg total) by mouth every 8 (eight) hours as needed for nausea or vomiting. 90 tablet 0   oxybutynin  (DITROPAN -XL) 5 MG 24 hr tablet Take 1 tablet (5 mg total) by mouth at bedtime. For overactive bladder 90 tablet 0   pantoprazole  (PROTONIX ) 40 MG tablet Take 1 tablet (40 mg total) by mouth 2 (two) times daily. TAKE 1 TABLET BY MOUTH DAILY FOR HEARTBURN (Patient taking differently: Take 80 mg by mouth daily. TAKE 2 TABLET BY MOUTH DAILY FOR HEARTBURN in am) 60 tablet 12   sertraline  (ZOLOFT ) 100 MG  tablet TAKE 1 TABLET BY MOUTH DAILY FOR ANXIETY AND DEPRESSION 90 tablet 0   vitamin B-12 (CYANOCOBALAMIN ) 1000 MCG tablet Take 1,000 mcg by mouth every other day.     No current facility-administered medications for this visit.    Allergies:   Other, Shellfish allergy , Venofer  [iron  sucrose], Cephalexin , Ciprofloxacin, Clopidogrel bisulfate, Ezetimibe -simvastatin, Lidocaine , Nitrofurantoin, Pregabalin, Celecoxib, Codeine, Codeine phosphate, Hydrocod poli-chlorphe poli er, Propoxyphene, Propoxyphene n-acetaminophen , Rofecoxib, Sulfa antibiotics, Sulfamethoxazole, and Sulfonamide derivatives   Social History:  The patient  reports that she has never smoked. She has never used smokeless tobacco. She reports that she does not drink alcohol and does not use drugs.   Family History:   family history includes Cancer in her mother and sister; Heart attack in her father and sister; Hypertension in her father; Lung cancer in her brother.    Review of Systems: Review of Systems  Constitutional: Negative.   HENT: Negative.    Respiratory: Negative.    Cardiovascular: Negative.   Gastrointestinal: Negative.   Musculoskeletal: Negative.   Neurological:  Negative.   Psychiatric/Behavioral: Negative.    All other systems reviewed and are negative.   PHYSICAL EXAM: VS:  BP 130/80 (BP Location: Left Arm, Patient Position: Sitting, Cuff Size: Normal)   Pulse 92 Comment: 73 oximeter  Ht 5' 9 (1.753 m)   Wt 163 lb (73.9 kg)   SpO2 98%   BMI 24.07 kg/m  , BMI Body mass index is 24.07 kg/m. Constitutional:  oriented to person, place, and time. No distress.  HENT:  Head: Grossly normal Eyes:  no discharge. No scleral icterus.  Neck: No JVD, no carotid bruits  Cardiovascular: Irregularly irregular, no murmurs appreciated Pulmonary/Chest: Clear to auscultation bilaterally, no wheezes or rails Abdominal: Soft.  no distension.  no tenderness.  Musculoskeletal: Normal range of motion Neurological:   normal muscle tone. Coordination normal. No atrophy Skin: Skin warm and dry Psychiatric: normal affect, pleasant  Recent Labs: 05/23/2024: Pro B Natriuretic peptide (BNP) 594.0 07/23/2024: B Natriuretic Peptide 596.1; Magnesium 1.8 11/12/2024: ALT 12; BUN 19; Creatinine 1.21; Hemoglobin 11.9; Platelet Count 171; Potassium 5.0; Sodium 140    Lipid Panel Lab Results  Component Value Date   CHOL 115 11/19/2024   HDL 34.90 (L) 11/19/2024   LDLCALC 44 11/19/2024   TRIG 184.0 (H) 11/19/2024      Wt Readings from Last 3 Encounters:  11/26/24 163 lb (73.9 kg)  11/19/24 161 lb 12.8 oz (73.4 kg)  11/12/24 156 lb 3.2 oz (70.9 kg)    ASSESSMENT AND PLAN:  Problem List Items Addressed This Visit       Cardiology Problems   Essential hypertension   Persistent atrial fibrillation (HCC) - Primary   Relevant Orders   EKG 12-Lead (Completed)   Nonrheumatic aortic valve insufficiency   Mitral regurgitation     Other   Type 2 diabetes mellitus with chronic kidney disease (HCC)   Other Visit Diagnoses       Coronary artery disease of native artery of native heart with stable angina pectoris       Relevant Orders   EKG 12-Lead (Completed)     Deep vein thrombosis (DVT) of other vein of right lower extremity, unspecified chronicity (HCC)          Preop cardiovascular evaluation Acceptable risk for GI surgery at [facility] in 2 weeks time for resection of gastric tumor No further cardiac workup needed  Coronary artery disease with stable angina Currently with no symptoms of angina. No further workup at this time. Continue current medication regimen.  Hyperlipidemia On nexlezit, no changes made  Essential hypertension Blood pressure is well controlled on today's visit. No changes made to the medications.  Long-term persistent atrial fibrillation Asymptomatic with adequate rate control echo with normal ejection fraction Off Eliquis  given GI tumor, high risk of bleeding Rate control  with metoprolol  succinate 100 daily  Anemia Prior history of GI bleed,  GI tumor, followed by GI and hematology oncology Off Eliquis  Hemoglobin stable   Signed, Velinda Lunger, M.D., Ph.D. [facility] [place], [place] [phone]

## 2024-11-26 ENCOUNTER — Encounter: Payer: Self-pay | Admitting: Cardiovascular Disease

## 2024-11-26 ENCOUNTER — Ambulatory Visit: Attending: Cardiovascular Disease | Admitting: Cardiovascular Disease

## 2024-11-26 VITALS — BP 130/80 | HR 92 | Ht 69.0 in | Wt 163.0 lb

## 2024-11-26 DIAGNOSIS — I2510 Atherosclerotic heart disease of native coronary artery without angina pectoris: Secondary | ICD-10-CM

## 2024-11-26 DIAGNOSIS — I351 Nonrheumatic aortic (valve) insufficiency: Secondary | ICD-10-CM | POA: Diagnosis not present

## 2024-11-26 DIAGNOSIS — I4819 Other persistent atrial fibrillation: Secondary | ICD-10-CM

## 2024-11-26 DIAGNOSIS — I10 Essential (primary) hypertension: Secondary | ICD-10-CM | POA: Diagnosis not present

## 2024-11-26 DIAGNOSIS — I82491 Acute embolism and thrombosis of other specified deep vein of right lower extremity: Secondary | ICD-10-CM

## 2024-11-26 DIAGNOSIS — E1122 Type 2 diabetes mellitus with diabetic chronic kidney disease: Secondary | ICD-10-CM | POA: Diagnosis not present

## 2024-11-26 DIAGNOSIS — N1832 Chronic kidney disease, stage 3b: Secondary | ICD-10-CM

## 2024-11-26 DIAGNOSIS — I340 Nonrheumatic mitral (valve) insufficiency: Secondary | ICD-10-CM | POA: Diagnosis not present

## 2024-11-26 DIAGNOSIS — I25118 Atherosclerotic heart disease of native coronary artery with other forms of angina pectoris: Secondary | ICD-10-CM | POA: Diagnosis not present

## 2024-11-26 MED ORDER — NITROGLYCERIN 0.4 MG SL SUBL: 0.4000 mg | SUBLINGUAL_TABLET | SUBLINGUAL | 2 refills | Status: AC | PRN

## 2024-11-26 MED ORDER — FUROSEMIDE 20 MG PO TABS: 20.0000 mg | ORAL_TABLET | Freq: Every day | ORAL | 3 refills | Status: AC | PRN

## 2024-11-26 NOTE — Patient Instructions (Addendum)
 Medication Instructions:  Lasix  20 mg daily as needed for ankle swelling Try mon/wed/fri  If you need a refill on your cardiac medications before your next appointment, please call your pharmacy.   Lab work: No new labs needed  Testing/Procedures: No new testing needed  Follow-Up: At [facility], you and your health needs are our priority.  As part of our continuing mission to provide you with exceptional heart care, we have created designated Provider Care Teams.  These Care Teams include your primary Cardiologist (physician) and Advanced Practice Providers (APPs -  Physician Assistants and Nurse Practitioners) who all work together to provide you with the care you need, when you need it.  You will need a follow up appointment in 6 months  Providers on your designated Care Team:   Lonni Meager, NP Bernardino Bring, PA-C Cadence [place], [place]  COVID-19 Vaccine Information can be found at: [email or web address] For questions related to vaccine distribution or appointments, please email [email or web address] or call [phone].

## 2024-12-03 ENCOUNTER — Inpatient Hospital Stay: Attending: Internal Medicine

## 2024-12-03 ENCOUNTER — Encounter: Payer: Self-pay | Admitting: Internal Medicine

## 2024-12-03 ENCOUNTER — Inpatient Hospital Stay: Admitting: Internal Medicine

## 2024-12-03 VITALS — BP 158/82 | HR 80 | Temp 97.2°F | Resp 16 | Ht 69.0 in | Wt 160.2 lb

## 2024-12-03 DIAGNOSIS — D509 Iron deficiency anemia, unspecified: Secondary | ICD-10-CM | POA: Insufficient documentation

## 2024-12-03 DIAGNOSIS — N1831 Chronic kidney disease, stage 3a: Secondary | ICD-10-CM | POA: Diagnosis not present

## 2024-12-03 DIAGNOSIS — D649 Anemia, unspecified: Secondary | ICD-10-CM

## 2024-12-03 DIAGNOSIS — C49A2 Gastrointestinal stromal tumor of stomach: Secondary | ICD-10-CM

## 2024-12-03 DIAGNOSIS — Z808 Family history of malignant neoplasm of other organs or systems: Secondary | ICD-10-CM | POA: Diagnosis not present

## 2024-12-03 DIAGNOSIS — Z801 Family history of malignant neoplasm of trachea, bronchus and lung: Secondary | ICD-10-CM | POA: Diagnosis not present

## 2024-12-03 LAB — CBC WITH DIFFERENTIAL (CANCER CENTER ONLY)
Abs Immature Granulocytes: 0.05 K/uL (ref 0.00–0.07)
Basophils Absolute: 0 K/uL (ref 0.0–0.1)
Basophils Relative: 0 %
Eosinophils Absolute: 0.1 K/uL (ref 0.0–0.5)
Eosinophils Relative: 1 %
HCT: 36.3 % (ref 36.0–46.0)
Hemoglobin: 12.2 g/dL (ref 12.0–15.0)
Immature Granulocytes: 1 %
Lymphocytes Relative: 29 %
Lymphs Abs: 1.4 K/uL (ref 0.7–4.0)
MCH: 33.1 pg (ref 26.0–34.0)
MCHC: 33.6 g/dL (ref 30.0–36.0)
MCV: 98.4 fL (ref 80.0–100.0)
Monocytes Absolute: 0.4 K/uL (ref 0.1–1.0)
Monocytes Relative: 8 %
Neutro Abs: 2.9 K/uL (ref 1.7–7.7)
Neutrophils Relative %: 61 %
Platelet Count: 213 K/uL (ref 150–400)
RBC: 3.69 MIL/uL — ABNORMAL LOW (ref 3.87–5.11)
RDW: 15.2 % (ref 11.5–15.5)
WBC Count: 4.7 K/uL (ref 4.0–10.5)
nRBC: 0 % (ref 0.0–0.2)

## 2024-12-03 LAB — SAMPLE TO BLOOD BANK

## 2024-12-03 LAB — CMP (CANCER CENTER ONLY)
ALT: 6 U/L (ref 0–44)
AST: 20 U/L (ref 15–41)
Albumin: 4.4 g/dL (ref 3.5–5.0)
Alkaline Phosphatase: 56 U/L (ref 38–126)
Anion gap: 10 (ref 5–15)
BUN: 19 mg/dL (ref 8–23)
CO2: 28 mmol/L (ref 22–32)
Calcium: 9.4 mg/dL (ref 8.9–10.3)
Chloride: 100 mmol/L (ref 98–111)
Creatinine: 1.22 mg/dL — ABNORMAL HIGH (ref 0.44–1.00)
GFR, Estimated: 42 mL/min — ABNORMAL LOW
Glucose, Bld: 131 mg/dL — ABNORMAL HIGH (ref 70–99)
Potassium: 4.3 mmol/L (ref 3.5–5.1)
Sodium: 138 mmol/L (ref 135–145)
Total Bilirubin: 0.6 mg/dL (ref 0.0–1.2)
Total Protein: 6.7 g/dL (ref 6.5–8.1)

## 2024-12-03 NOTE — Progress Notes (Signed)
 [facility] CONSULT NOTE  Patient Care Team: Gretta Comer POUR, NP as PCP - General (Internal Medicine) Perla Evalene PARAS, MD as PCP - Cardiology (Cardiology) Alline Lenis, MD (Inactive) as Consulting Physician (Urology) Portia Fireman, OD as Consulting Physician (Optometry) Homsher, Wanda, RN as [facility] Management Rennie Cindy SAUNDERS, MD as Consulting Physician (Oncology) Maurie Rayfield BIRCH, RN as Oncology Nurse Navigator  CHIEF COMPLAINTS/PURPOSE OF CONSULTATION: GIST   Oncology History Overview Note  Gastric tumor- AUG 2025- CT A/P: Large mass within the lumen of the stomach-  Large round well-circumscribed mass in the mid stomach measures 6.3 x 6.3 cm (image 63) mass is contained within lumen of the stomach.. No evidence of metastatic adenopathy in the abdomen pelvis. No liver metastasis. No peritoneal metastasis. CT chest:  Solitary pulmonary nodule in the RIGHT lower lobe is new from 2014. Indeterminate finding. FDG PET scan may aid in determining malignant potential. 2. No mediastinal adenopathy.  PET scan:  6.6 cm gastric mass has diffuse low-grade metabolic activity, 1.0 cm left lower lobe pulmonary nodule has a maximum SUV of 2.5, borderline for malignancy. This nodule was not present on 08/09/2013.   # AUG 2025EGD biops-y negative/inconclusive.  SEP 2025- S/p endoscopic ultrasound with biopsy [Dr.Mansouraty, [place]]- positive for spindle cell neoplasm.  EUS-suggestive of subepithelial/from the muscularis.  however, given the a cellularity-unable to perform IHC stains-differential diagnosis includes GIST tumor/leiomyoma vs others.  Ordered-liquid biopsy-NEG  # OCT 2025- GIST- however- ckit & PDGFRA- NEG; ATYPICAL GIST. On GLEEVEC  400 mg x1 month- STOPPED AFTER NGS Results.   #     # NHL/of the bowel - [[facility]]treated >25  y ago- RT.  Chemo x 6- followed by RT- No surgery.    Malignant spindle cell neoplasm (HCC)  08/16/2024 Initial Diagnosis   Malignant spindle  cell neoplasm (HCC)     HISTORY OF PRESENTING ILLNESS:   Accompanied by family.  Walking with a rolling walker  Ronal KATHEE Lango 89 y.o.  female patient with a medical history significant of  persistent Atrial fibrillation-currently off Eliquis ,  stomach GIST; CKD IIIa-iron  deficient anemia  on Gleevec  is here for a follow up.   Discussed the use of AI scribe software for clinical note transcription with the patient, who gave verbal consent to proceed.   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  She has a 5-6 cm gastric GIST that has remained stable in size without evidence of recent growth. She previously received imatinib , but did not respond to therapy. She is scheduled for laparoscopic gastric resection on December 18, 2024, with an anticipated hospital stay of 2-3 days. She denies gastrointestinal bleeding and has not experienced new or worsening symptoms related to her tumor. Hemoglobin is stable at 12.2 g/dL and blood counts are within normal limits.  She discontinued apixaban  and remains off anticoagulation, with no recent recommendation to restart. She continues metoprolol  for cardiac indications.  She discussed perioperative management and postoperative expectations, including dietary restrictions and recovery time. She expressed concern regarding her ability to resume normal eating habits after surgery, referencing prior experiences with dietary limitations following endoscopy. She inquired about specific foods.      Review of Systems  Constitutional:  Positive for malaise/fatigue. Negative for chills, diaphoresis, fever and weight loss.  HENT:  Negative for nosebleeds and sore throat.   Eyes:  Negative for  double vision.  Respiratory:  Negative for cough, hemoptysis, sputum production, shortness of breath and  wheezing.   Cardiovascular:  Negative for chest pain, palpitations, orthopnea and leg swelling.  Gastrointestinal:  Negative for abdominal pain, blood in stool, constipation, diarrhea, heartburn, melena, nausea and vomiting.  Genitourinary:  Negative for dysuria, frequency and urgency.  Musculoskeletal:  Negative for back pain and joint pain.  Skin: Negative.  Negative for itching and rash.  Neurological:  Negative for dizziness, tingling, focal weakness, weakness and headaches.  Endo/Heme/Allergies:  Does not bruise/bleed easily.  Psychiatric/Behavioral:  Negative for depression. The patient is not nervous/anxious and does not have insomnia.     MEDICAL HISTORY:  Past Medical History:  Diagnosis Date   Acute bilateral thoracic back pain 08/16/2019   Acute post-hemorrhagic anemia 10/10/2023   Allergy     Cipro, Plavix, Statins   Anemia    Arthritis    Atrial fibrillation (HCC)    CAD (coronary artery disease)    a. s/p tandem Promus DES to LAD in 2009 by Dr. Obie;  b.  LHC (03/22/14):  prox LAD 30%, mid LAD 40%, LAD stent ok with dist 20% ISR, apical LAD occluded with L-L collats filling apical vessel (too small for PCI), mid RCA 20%, EF 70%.  Med Rx   Cancer [facility])    Chronic kidney disease, stage 3b (HCC)    Colon polyps    Constipation 12/30/2014   Diabetes mellitus    Diagnosed 2012   Diverticulosis of colon (without mention of hemorrhage)    DVT (deep venous thrombosis) (HCC) 12/2022   right leg   Elevated LFTs 04/19/2014   Gastric tumor 08/30/2024   Gastric ulcer 07/23/2024   GERD (gastroesophageal reflux disease)    GI bleed 07/12/2024   Glaucoma    Grade I diastolic dysfunction    Grade I diastolic dysfunction    Herpes zoster without complication 04/24/2020   Hypercoagulable state due to persistent atrial fibrillation (HCC) 08/17/2023   Hyperlipidemia    intol of statins   Hypertension    Kidney stones    Left atrial dilation    Moderate aortic regurgitation     Motion sickness    back seat cars   Nausea and vomiting 07/12/2024   NHL (non-Hodgkin's lymphoma) (HCC) dx'd 2003   chemo/xrt comp 2003   Personal history of colonic polyps    Proctitis    Pruritic intertrigo 07/18/2014   Syncope and collapse 10/09/2023   Urticaria    Wears dentures    Full upper, partial lower   Wears hearing aid in both ears     SURGICAL HISTORY: Past Surgical History:  Procedure Laterality Date   BIOPSY  10/15/2023   Procedure: BIOPSY;  Surgeon: Aundria, Ladell POUR, MD;  Location: [facility] ENDOSCOPY;  Service: Gastroenterology;;   cardiac cath-neg     CATARACT EXTRACTION Bilateral    COLONOSCOPY WITH PROPOFOL  N/A 10/15/2023   Procedure: COLONOSCOPY WITH PROPOFOL ;  Surgeon: Toledo, Ladell POUR, MD;  Location: [facility] ENDOSCOPY;  Service: Gastroenterology;  Laterality: N/A;   CORONARY ANGIOPLASTY WITH STENT PLACEMENT     x 2   dexa-neg     ESOPHAGOGASTRODUODENOSCOPY N/A 07/13/2024   Procedure: EGD (ESOPHAGOGASTRODUODENOSCOPY);  Surgeon: Jinny Carmine, MD;  Location[facility] ENDOSCOPY;  Service: Endoscopy;  Laterality: N/A;   ESOPHAGOGASTRODUODENOSCOPY N/A 07/15/2024   Procedure: EGD (ESOPHAGOGASTRODUODENOSCOPY);  Surgeon: Onita Elspeth Sharper, DO;  Location: [facility] ENDOSCOPY;  Service: Gastroenterology;  Laterality: N/A;   ESOPHAGOGASTRODUODENOSCOPY N/A 08/09/2024   Procedure: EGD (ESOPHAGOGASTRODUODENOSCOPY);  Surgeon: Wilhelmenia Aloha Raddle., MD;  Location: [facility] ENDOSCOPY;  Service: Gastroenterology;  Laterality: N/A;   ESOPHAGOGASTRODUODENOSCOPY N/A 08/30/2024   Procedure: EGD (ESOPHAGOGASTRODUODENOSCOPY);  Surgeon: Wilhelmenia Aloha Raddle., MD;  Location: [facility] ENDOSCOPY;  Service: Gastroenterology;  Laterality: N/A;   ESOPHAGOGASTRODUODENOSCOPY (EGD) WITH PROPOFOL  N/A 10/15/2023   Procedure: ESOPHAGOGASTRODUODENOSCOPY (EGD) WITH PROPOFOL ;  Surgeon: Toledo, Ladell POUR, MD;  Location: [facility] ENDOSCOPY;  Service: Gastroenterology;  Laterality: N/A;   EUS N/A 08/09/2024   Procedure:  ULTRASOUND, UPPER GI TRACT, ENDOSCOPIC;  Surgeon: Wilhelmenia Aloha Raddle., MD;  Location: [facility] ENDOSCOPY;  Service: Gastroenterology;  Laterality: N/A;   EUS N/A 08/30/2024   Procedure: ULTRASOUND, UPPER GI TRACT, ENDOSCOPIC;  Surgeon: Wilhelmenia Aloha Raddle., MD;  Location: [facility] ENDOSCOPY;  Service: Gastroenterology;  Laterality: N/A;   FINE NEEDLE ASPIRATION  08/09/2024   Procedure: FINE NEEDLE ASPIRATION;  Surgeon: Wilhelmenia Aloha Raddle., MD;  Location: [facility] ENDOSCOPY;  Service: Gastroenterology;;   FINE NEEDLE ASPIRATION  08/30/2024   Procedure: FINE NEEDLE ASPIRATION;  Surgeon: Wilhelmenia Aloha Raddle., MD;  Location: [facility] ENDOSCOPY;  Service: Gastroenterology;;   HEMOSTASIS CLIP PLACEMENT  10/15/2023   Procedure: HEMOSTASIS CLIP PLACEMENT;  Surgeon: Aundria, Ladell POUR, MD;  Location: [facility] ENDOSCOPY;  Service: Gastroenterology;;   KNEE SURGERY  10/30/2003   left   LEFT HEART CATHETERIZATION WITH CORONARY ANGIOGRAM N/A 03/22/2014   Procedure: LEFT HEART CATHETERIZATION WITH CORONARY ANGIOGRAM;  Surgeon: Lonni JONETTA Cash, MD;  Location: [facility] CATH LAB;  Service: Cardiovascular;  Laterality: N/A;   POLYPECTOMY  10/15/2023   Procedure: POLYPECTOMY;  Surgeon: Toledo, Ladell POUR, MD;  Location: [facility] ENDOSCOPY;  Service: Gastroenterology;;   stress cardiolite      VAGINAL HYSTERECTOMY     partial, fibroids, one ovary left    SOCIAL HISTORY: Social History   Socioeconomic History   Marital status: Widowed    Spouse name: Not on file   Number of children: 0   Years of education: Not on file   Highest education level: 12th grade  Occupational History   Occupation: Retired    Associate Professor: RETIRED   Occupation: retired  Tobacco Use   Smoking status: Never   Smokeless tobacco: Never  Vaping Use   Vaping status: Never Used  Substance and Sexual Activity   Alcohol use: No   Drug use: No   Sexual activity: Not Currently  Other Topics Concern   Not on file  Social History Narrative   Not on file    Social Drivers of Health   Tobacco Use: Low Risk (12/03/2024)   Patient History    Smoking Tobacco Use: Never    Smokeless Tobacco Use: Never    Passive Exposure: Not on file  Financial Resource Strain: Low Risk (09/24/2024)   Overall Financial Resource Strain (CARDIA)    Difficulty of Paying Living Expenses: Not hard at all  Food Insecurity: No Food Insecurity (11/02/2024)   Received [facility]   Epic    Within the past 12 months, you worried that your food would run out before you got the money to buy more.: Never true    Within the past 12 months, the food you bought just didn't last and you didn't have money to get more.: Never true  Transportation Needs: No Transportation Needs (11/02/2024)   Received from [facility] - Transportation    Lack of Transportation (Medical): No    Lack of Transportation (Non-Medical): No  Physical Activity: Inactive (09/24/2024)   Exercise Vital Sign    Days  of Exercise per Week: 0 days    Minutes of Exercise per Session: Not on file  Stress: No Stress Concern Present (09/24/2024)   [employer] of Occupational [employer] - Occupational Stress Questionnaire    Feeling of Stress: Not at all  Social Connections: Moderately Isolated (09/24/2024)   Social Connection and Isolation Panel    Frequency of Communication with Friends and Family: More than three times a week    Frequency of Social Gatherings with Friends and Family: Once a week    Attends Religious Services: More than 4 times per year    Active Member of [employer] or Organizations: No    Attends Banker Meetings: Not on file    Marital Status: Widowed  Intimate Partner Violence: Not At Risk (07/23/2024)   Epic    Fear of Current or Ex-Partner: No    Emotionally Abused: No    Physically Abused: No    Sexually Abused: No  Depression (PHQ2-9): Low Risk (12/03/2024)   Depression (PHQ2-9)    PHQ-2 Score: 1  Alcohol Screen: Not on file  Housing: Low Risk  (09/24/2024)   Epic    Unable to Pay for Housing in the Last Year: No    Number of Times Moved in the Last Year: 1    Homeless in the Last Year: No  Utilities: Low Risk (11/02/2024)   Received [facility]   Utilities    Within the past 12 months, have you been unable to get utilities(heat, electricity) when it was really needed?: No  Health Literacy: Not on file    FAMILY HISTORY: Family History  Problem Relation Age of Onset   Cancer Mother        jaw   Hypertension Father    Heart attack Father    Heart attack Sister    Cancer Sister        unknown primary-mets throughout body   Lung cancer Brother    Colon cancer Neg Hx    Esophageal cancer Neg Hx    Pancreatic cancer Neg Hx    Stomach cancer Neg Hx    Liver disease Neg Hx     ALLERGIES:  is allergic to other, shellfish allergy , venofer  [iron  sucrose], cephalexin , ciprofloxacin, clopidogrel bisulfate, ezetimibe -simvastatin, lidocaine , nitrofurantoin, pregabalin, celecoxib, codeine, codeine phosphate, hydrocod poli-chlorphe poli er, propoxyphene, propoxyphene n-acetaminophen , rofecoxib, sulfa antibiotics, sulfamethoxazole, and sulfonamide derivatives.  MEDICATIONS:  Current Outpatient Medications  Medication Sig Dispense Refill   albuterol  (VENTOLIN  HFA) 108 (90 Base) MCG/ACT inhaler Inhale 2 puffs into the lungs every 6 (six) hours as needed for wheezing or shortness of breath. 8 g 0   Bempedoic Acid -Ezetimibe  (NEXLIZET ) 180-10 MG TABS TAKE 1 TABLET BY MOUTH DAILY 90 tablet 3   Blood Glucose Monitoring Suppl DEVI 1 each by Does not apply route in the morning, at noon, and at bedtime. May substitute to any manufacturer covered by patient's insurance. 1 each 0   Cholecalciferol  (VITAMIN D -3) 1000 UNITS CAPS Take 1 capsule by mouth daily.     CRANBERRY PO Take 1 tablet by mouth daily.     diclofenac  Sodium (VOLTAREN ) 1 % GEL Apply 4 g topically 4 (four) times daily. To your chest wall 350 g 1   erythromycin ophthalmic  ointment SMARTSIG:In Eye(s)     ferrous sulfate  325 (65 FE) MG tablet Take 1 tablet (325 mg total) by mouth daily with breakfast. 30 tablet 2   fexofenadine  (ALLEGRA ) 180 MG tablet Take 1 tablet (180 mg total)  by mouth as needed for allergies or rhinitis. 90 tablet 0   furosemide  (LASIX ) 20 MG tablet Take 1 tablet (20 mg total) by mouth daily as needed. 30 tablet 3   gabapentin  (NEURONTIN ) 100 MG capsule Take 200 mg by mouth 2 (two) times daily.     glipiZIDE  (GLUCOTROL  XL) 5 MG 24 hr tablet Take 1 tablet (5 mg total) by mouth daily with breakfast. for diabetes. 90 tablet 0   Glucose Blood (BLOOD GLUCOSE TEST STRIPS) STRP 1 each by In Vitro route in the morning, at noon, and at bedtime. May substitute to any manufacturer covered by patient's insurance. 300 strip 5   Lancets Misc. MISC 1 each by Does not apply route in the morning, at noon, and at bedtime. May substitute to any manufacturer covered by patient's insurance. 300 each 5   metoprolol  succinate (TOPROL -XL) 100 MG 24 hr tablet TAKE 1 TABLET BY MOUTH EVERY MORNING WITH OR IMMEDIATELY FOLLOWING A MEAL 90 tablet 3   nitroGLYCERIN  (NITROSTAT ) 0.4 MG SL tablet Place 1 tablet (0.4 mg total) under the tongue every 5 (five) minutes as needed for chest pain. 25 tablet 2   ondansetron  (ZOFRAN -ODT) 4 MG disintegrating tablet Take 1 tablet (4 mg total) by mouth every 8 (eight) hours as needed for nausea or vomiting. 90 tablet 0   oxybutynin  (DITROPAN -XL) 5 MG 24 hr tablet Take 1 tablet (5 mg total) by mouth at bedtime. For overactive bladder 90 tablet 0   pantoprazole  (PROTONIX ) 40 MG tablet Take 1 tablet (40 mg total) by mouth 2 (two) times daily. TAKE 1 TABLET BY MOUTH DAILY FOR HEARTBURN (Patient taking differently: Take 80 mg by mouth daily. TAKE 2 TABLET BY MOUTH DAILY FOR HEARTBURN in am) 60 tablet 12   sertraline  (ZOLOFT ) 100 MG tablet TAKE 1 TABLET BY MOUTH DAILY FOR ANXIETY AND DEPRESSION 90 tablet 0   vitamin B-12 (CYANOCOBALAMIN ) 1000 MCG  tablet Take 1,000 mcg by mouth every other day.     No current facility-administered medications for this visit.    PHYSICAL EXAMINATION:   Vitals:   12/03/24 1433 12/03/24 1507  BP: (!) 152/87 (!) 158/82  Pulse: 80   Resp: 16   Temp: (!) 97.2 F (36.2 C)   SpO2: 98%      Filed Weights   12/03/24 1433  Weight: 160 lb 3.2 oz (72.7 kg)    Significant bruising of the right of the face.  Physical Exam Vitals and nursing note reviewed.  HENT:     Head: Normocephalic and atraumatic.     Mouth/Throat:     Pharynx: Oropharynx is clear.  Eyes:     Extraocular Movements: Extraocular movements intact.     Pupils: Pupils are equal, round, and reactive to light.  Cardiovascular:     Rate and Rhythm: Normal rate and regular rhythm.  Pulmonary:     Comments: Decreased breath sounds bilaterally.  Abdominal:     Palpations: Abdomen is soft.  Musculoskeletal:        General: Normal range of motion.     Cervical back: Normal range of motion.  Skin:    General: Skin is warm.  Neurological:     General: No focal deficit present.     Mental Status: She is alert and oriented to person, place, and time.  Psychiatric:        Behavior: Behavior normal.        Judgment: Judgment normal.     LABORATORY DATA:  I have reviewed  the data as listed Lab Results  Component Value Date   WBC 4.7 12/03/2024   HGB 12.2 12/03/2024   HCT 36.3 12/03/2024   MCV 98.4 12/03/2024   PLT 213 12/03/2024   Recent Labs    07/14/24 0539 07/15/24 0513 07/16/24 0434 07/17/24 0540 10/09/24 0851 11/12/24 1043 12/03/24 1439  NA 137 139 141   < > 138 140 138  K 4.1 3.8 4.3   < > 4.9 5.0 4.3  CL 104 109 107   < > 104 105 100  CO2 27 23 26    < > 27 26 28   GLUCOSE 129* 144* 142*   < > 156* 187* 131*  BUN 38* 22 14   < > 26* 19 19  CREATININE 1.31* 1.16* 1.28*   < > 1.33* 1.21* 1.22*  CALCIUM 8.2* 8.2* 8.4*   < > 8.7* 9.4 9.4  GFRNONAA 39* 45* 40*   < > 38* 42* 42*  PROT 5.0* 4.9* 5.2*   < >  6.1* 6.4* 6.7  ALBUMIN 3.0* 3.0* 3.1*   < > 3.7 4.1 4.4  AST 25 24 23    < > 29 20 20   ALT 11 11 12    < > 16 12 6   ALKPHOS 21* 23* 25*   < > 36* 62 56  BILITOT 0.8 0.8 0.9   < > 0.8 0.4 0.6  BILIDIR 0.1 0.2 0.2  --   --   --   --   IBILI 0.7 0.6 0.7  --   --   --   --    < > = values in this interval not displayed.    RADIOGRAPHIC STUDIES: I have personally reviewed the radiological images as listed and agreed with the findings in the report. No results found.    Gastrointestinal stromal tumor (GIST) of body of stomach (HCC) # OCT 2025- Spindle cell neoplasm consistent with gastrointestinal stromal tumor (GIST), spindle cell type AUG 2025- CT A/P: Large mass within the lumen of the stomach-  Large round well-circumscribed mass in the mid stomach measures 6.3 x 6.3 cm (image 63) mass is contained within lumen of the stomach.. No evidence of metastatic adenopathy in the abdomen pelvis. No liver metastasis. No peritoneal metastasis. CT chest:  Solitary pulmonary nodule in the RIGHT lower lobe is new from 2014. Indeterminate finding. FDG PET scan may aid in determining malignant potential. 2. No mediastinal adenopathy.  PET scan:  6.6 cm gastric mass has diffuse low-grade metabolic activity, 1.0 cm left lower lobe pulmonary nodule has a maximum SUV of 2.5, borderline for malignancy.     # AUG 2025 - EGD biopsy negative/inconclusive.  SEP 2025- S/p endoscopic ultrasound with biopsy x 2 [Dr.Mansouraty, [place]]- positive for spindle cell neoplasm.  EUS-POSITIVE FOR GIST.  STAGE IB [although-left lower lobe lung nodule unclear etiology]. NGS Tempus- NEG C-kit or PDGFR mutation.  Succinate dehydrogenase IHC- INTACT. DISCONTINUED Gleevec .   # s/p evaluation with [facility] Dr.Meyers-surgery Jan 20th, 2026.  Patient will follow-up with us  in about couple of months after surgery.  Atypical GIST-no role for any adjuvant Gleevec .  # Severe anemia-second iron  deficiency/c acute on hronic GI bleed secondary to #1-today  hemoglobin is 12. -iron  infusion discontinued because of grade 4 anaphylactic reaction.  Continue oral iron  once a day.  Today hemoglobin is 12.2  monitor for now.   # History of A-fib - OFF Eliquis -[Dr.Gollan]- s/p cardiology- consider Watchman device- defer to cardiology.     # Anaphylaxis with Venofer -   #  DISPOSITION: # No blood- cancel tomorrow appt # follow up in 2 months-  MD; labs- cbc-CMP- Dr.B   Above plan of care was discussed with patient/family in detail.  My contact information was given to the patient/family.     Vernal Hritz R Monserratt Knezevic, MD 12/03/2024 3:45 PM

## 2024-12-03 NOTE — Assessment & Plan Note (Addendum)
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 No evidence of metastatic adenopathy in the abdomen pelvis. No liver metastasis. No peritoneal metastasis. CT chest:  Solitary pulmonary nodule in the RIGHT lower lobe is new from 2014. Indeterminate finding. FDG PET scan may aid in determining malignant potential. 2. No mediastinal adenopathy.  PET scan:  6.6 cm gastric mass has diffuse low-grade metabolic activity, 1.0 cm left lower lobe pulmonary nodule has a maximum SUV of 2.5, borderline for malignancy.     # AUG 2025 - EGD biopsy negative/inconclusive.  SEP 2025- S/p endoscopic ultrasound with biopsy x 2 [Dr.Mansouraty, [place]]- positive for spindle cell neoplasm.  EUS-POSITIVE FOR GIST.  STAGE IB [although-left lower lobe lung nodule unclear etiology]. NGS Tempus- NEG C-kit or PDGFR mutation.  Succinate dehydrogenase IHC- INTACT. DISCONTINUED Gleevec .   # s/p evaluation with [facility] Dr.Meyers-surgery Jan 20th, 2026.  Patient will follow-up with us  in about couple of months after surgery.  Atypical GIST-no role for any adjuvant Gleevec .  # Severe anemia-second iron  deficiency/c acute on hronic GI bleed secondary to #1-today hemoglobin is 12. -iron  infusion discontinued because of grade 4 anaphylactic reaction.  Continue oral iron  once a day.  Today hemoglobin is 12.2  monitor for now.   # History of A-fib - OFF Eliquis -[Dr.Gollan]- s/p cardiology- consider Watchman device- defer to cardiology.     # Anaphylaxis with Venofer -   # DISPOSITION: # No blood- cancel tomorrow appt # follow up in 2 months-  MD; labs- cbc-CMP- Dr.B

## 2024-12-03 NOTE — Progress Notes (Signed)
 12/18/24 [facility] tumor removal surgery.

## 2024-12-04 ENCOUNTER — Inpatient Hospital Stay

## 2024-12-15 ENCOUNTER — Telehealth: Admitting: Nurse Practitioner

## 2024-12-15 DIAGNOSIS — R309 Painful micturition, unspecified: Secondary | ICD-10-CM | POA: Diagnosis not present

## 2024-12-15 MED ORDER — AMOXICILLIN-POT CLAVULANATE 875-125 MG PO TABS: 1.0000 | ORAL_TABLET | Freq: Two times a day (BID) | ORAL | 0 refills | Status: AC

## 2024-12-15 NOTE — Progress Notes (Signed)
 " Virtual Visit Consent   [name] [name], you are scheduled for a virtual visit with a [facility] provider today. Just as with appointments in the office, your consent must be obtained to participate. Your consent will be active for this visit and any virtual visit you may have with one of our providers in the next 365 days. If you have a MyChart account, a copy of this consent can be sent to you electronically.  As this is a virtual visit, video technology does not allow for your provider to perform a traditional examination. This may limit your provider's ability to fully assess your condition. If your provider identifies any concerns that need to be evaluated in person or the need to arrange testing (such as labs, EKG, etc.), we will make arrangements to do so. Although advances in technology are sophisticated, we cannot ensure that it will always work on either your end or our end. If the connection with a video visit is poor, the visit may have to be switched to a telephone visit. With either a video or telephone visit, we are not always able to ensure that we have a secure connection.  By engaging in this virtual visit, you consent to the provision of healthcare and authorize for your insurance to be billed (if applicable) for the services provided during this visit. Depending on your insurance coverage, you may receive a charge related to this service.  I need to obtain your verbal consent now. Are you willing to proceed with your visit today? [name] [name] has provided verbal consent on 12/15/2024 for a virtual visit (video or telephone). Haze LELON Servant, NP  Date: 12/15/2024 12:47 PM   Virtual Visit via Video Note   I, Haze LELON Servant, connected with  [name] [name]  ([id number], 10-Feb-1932) on 12/15/24 at 12:15 PM EST by a video-enabled telemedicine application and verified that I am speaking with the correct person using two identifiers.  Location: Patient: Virtual Visit Location Patient:  Home Provider: Virtual Visit Location Provider: Home Office   I discussed the limitations of evaluation and management by telemedicine and the availability of in person appointments. The patient expressed understanding and agreed to proceed.    History of Present Illness: [name] [name] is a 89 y.o. who identifies as a female who was assigned female at birth, and is being seen today for UTI symptoms.  [name] [name] has been experiencing painful urination, burning and body going numb the first few seconds of urination. Symptoms started last night. She took 2 AZO tablets and feels slightly better today.   Problems:  Patient Active Problem List   Diagnosis Date Noted   Overactive bladder 10/17/2024   Gastrointestinal stromal tumor (GIST) of body of stomach (HCC) 09/03/2024   Malignant spindle cell neoplasm (HCC) 08/16/2024   Gastric mass 07/23/2024   Symptomatic anemia 07/23/2024   Anaphylaxis to venofer  07/23/2024   Chronic diastolic CHF (congestive heart failure) (HCC) 07/23/2024   Chronic kidney disease, stage 3b (HCC) 07/23/2024   Hypokalemia 07/23/2024   Iron  deficiency anemia 08/24/2023   Persistent atrial fibrillation (HCC) 08/17/2023   Chronic pain of both knees 08/27/2022   Urinary incontinence 02/05/2022   Hav (hallux abducto valgus), unspecified laterality 01/25/2022   Nonrheumatic aortic valve insufficiency 10/05/2021   Bilateral lower extremity edema 03/26/2020   Preventative health care 06/05/2019   Recurrent UTI 01/12/2018   Insomnia 09/27/2017   Anxiety and depression 05/04/2016   Osteopenia 08/06/2015   B12 deficiency 12/30/2014  Allergic reaction 09/02/2014   Chronic fatigue 06/04/2014   Mitral regurgitation 04/19/2014   Renal insufficiency 06/07/2011   Type 2 diabetes mellitus with chronic kidney disease (HCC) 05/31/2011   Mixed hyperlipidemia 08/04/2010   LUMBAR SPRAIN AND STRAIN 01/15/2010   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  Resting comfortably at home.  Head is normocephalic, atraumatic.  No labored breathing.  Speech is clear and coherent with logical content.  Patient is alert and oriented at baseline.    Assessment and Plan: 1. Painful urination (Primary) - amoxicillin -clavulanate (AUGMENTIN ) 875-125 MG tablet; Take 1 tablet by mouth 2 (two) times daily for 7 days.  Dispense: 14 tablet; Refill: 0  She has surgery scheduled for next week.  I have advised her to not start any antibiotics unless her symptoms worsen.  She needs to follow-up with surgeon on Monday to determine next course of action.  Follow Up Instructions: I discussed the assessment and treatment plan with the patient. The patient was provided an opportunity to ask questions and all were answered. The patient agreed with the plan and demonstrated an understanding of the instructions.  A copy of instructions were sent to the patient via MyChart unless otherwise noted below.     The patient was advised to call back or seek an in-person evaluation if the symptoms worsen or if the condition fails to improve as anticipated.    Haze LELON Servant, NP      [1]  Allergies Allergen Reactions   Other Rash and Anaphylaxis   Shellfish Allergy  Anaphylaxis and Rash   Venofer  [Iron  Sucrose] Anaphylaxis    Severe anaphylactic reaction to Venofer -needing chest compressions; EpiPen .    Cephalexin      REACTION: trash and  swelling   Ciprofloxacin     REACTION: u/k   Clopidogrel Bisulfate     REACTION: swelling, rash   Ezetimibe -Simvastatin     REACTION: myalgias   Lidocaine  Hives    ONLY TO ADHESIVE LIDOCAINE  PATCHES. NO PROBLEM WITH INJECTABLE LIDOCAINE .   Nitrofurantoin     REACTION: uk   Pregabalin     REACTION: felt bad   Celecoxib Rash    REACTION: uk   Codeine Rash   Codeine Phosphate Rash   Hydrocod Poli-Chlorphe Poli Er Rash   Propoxyphene Rash   Propoxyphene N-Acetaminophen  Rash   Rofecoxib Rash   Sulfa Antibiotics Rash   Sulfamethoxazole Rash   Sulfonamide Derivatives Rash  [2]  Current Outpatient Medications:    amoxicillin -clavulanate (AUGMENTIN ) 875-125 MG tablet, Take 1 tablet by mouth 2 (two) times daily for 7 days., Disp: 14 tablet, Rfl: 0   albuterol  (VENTOLIN  HFA) 108 (90 Base) MCG/ACT inhaler, Inhale 2 puffs into the lungs every 6 (six) hours as needed for wheezing or shortness of breath., Disp: 8 g, Rfl: 0   Bempedoic Acid -Ezetimibe  (NEXLIZET ) 180-10 MG TABS, TAKE 1 TABLET BY MOUTH DAILY, Disp: 90 tablet, Rfl: 3   Blood Glucose Monitoring Suppl DEVI, 1 each by Does not apply route in the morning, at noon, and at  bedtime. May substitute to any manufacturer covered by patient's insurance., Disp: 1 each, Rfl: 0   Cholecalciferol  (VITAMIN D -3) 1000 UNITS CAPS, Take 1 capsule by mouth daily., Disp: , Rfl:    CRANBERRY PO, Take 1 tablet by mouth daily., Disp: , Rfl:    diclofenac  Sodium (VOLTAREN ) 1 % GEL, Apply 4 g topically 4 (four) times daily. To your chest wall, Disp: 350 g, Rfl: 1   erythromycin ophthalmic ointment, SMARTSIG:In Eye(s), Disp: , Rfl:    ferrous sulfate  325 (65 FE) MG tablet, Take 1 tablet (325 mg total) by mouth daily with breakfast., Disp: 30 tablet, Rfl: 2   fexofenadine  (ALLEGRA ) 180 MG tablet, Take 1 tablet (180 mg total) by mouth as needed for allergies or rhinitis., Disp: 90 tablet, Rfl: 0   furosemide  (LASIX ) 20 MG tablet, Take 1 tablet (20 mg total) by  mouth daily as needed., Disp: 30 tablet, Rfl: 3   gabapentin  (NEURONTIN ) 100 MG capsule, Take 200 mg by mouth 2 (two) times daily., Disp: , Rfl:    glipiZIDE  (GLUCOTROL  XL) 5 MG 24 hr tablet, Take 1 tablet (5 mg total) by mouth daily with breakfast. for diabetes., Disp: 90 tablet, Rfl: 0   Glucose Blood (BLOOD GLUCOSE TEST STRIPS) STRP, 1 each by In Vitro route in the morning, at noon, and at bedtime. May substitute to any manufacturer covered by patient's insurance., Disp: 300 strip, Rfl: 5   Lancets Misc. MISC, 1 each by Does not apply route in the morning, at noon, and at bedtime. May substitute to any manufacturer covered by patient's insurance., Disp: 300 each, Rfl: 5   metoprolol  succinate (TOPROL -XL) 100 MG 24 hr tablet, TAKE 1 TABLET BY MOUTH EVERY MORNING WITH OR IMMEDIATELY FOLLOWING A MEAL, Disp: 90 tablet, Rfl: 3   nitroGLYCERIN  (NITROSTAT ) 0.4 MG SL tablet, Place 1 tablet (0.4 mg total) under the tongue every 5 (five) minutes as needed for chest pain., Disp: 25 tablet, Rfl: 2   ondansetron  (ZOFRAN -ODT) 4 MG disintegrating tablet, Take 1 tablet (4 mg total) by mouth every 8 (eight) hours as needed for nausea or vomiting., Disp: 90 tablet, Rfl: 0   oxybutynin  (DITROPAN -XL) 5 MG 24 hr tablet, Take 1 tablet (5 mg total) by mouth at bedtime. For overactive bladder, Disp: 90 tablet, Rfl: 0   pantoprazole  (PROTONIX ) 40 MG tablet, Take 1 tablet (40 mg total) by mouth 2 (two) times daily. TAKE 1 TABLET BY MOUTH DAILY FOR HEARTBURN (Patient taking differently: Take 80 mg by mouth daily. TAKE 2 TABLET BY MOUTH DAILY FOR HEARTBURN in am), Disp: 60 tablet, Rfl: 12   sertraline  (ZOLOFT ) 100 MG tablet, TAKE 1 TABLET BY MOUTH DAILY FOR ANXIETY AND DEPRESSION, Disp: 90 tablet, Rfl: 0   vitamin B-12 (CYANOCOBALAMIN ) 1000 MCG tablet, Take 1,000 mcg by mouth every other day., Disp: , Rfl:   "

## 2024-12-15 NOTE — Patient Instructions (Signed)
 " [name] [name], thank you for joining Haze LELON Servant, NP for today's virtual visit.  While this provider is not your primary care provider (PCP), if your PCP is located in our provider database this encounter information will be shared with them immediately following your visit.   A [facility] MyChart account gives you access to today's visit and all your visits, tests, and labs performed at [facility] ([facility])  click here if you don't have a [facility] MyChart account or go to [email or web address]  Consent: (Patient) [name] [name] provided verbal consent for this virtual visit at the beginning of the encounter.  Current Medications:  Current Outpatient Medications:    amoxicillin -clavulanate (AUGMENTIN ) 875-125 MG tablet, Take 1 tablet by mouth 2 (two) times daily for 7 days., Disp: 14 tablet, Rfl: 0   albuterol  (VENTOLIN  HFA) 108 (90 Base) MCG/ACT inhaler, Inhale 2 puffs into the lungs every 6 (six) hours as needed for wheezing or shortness of breath., Disp: 8 g, Rfl: 0   Bempedoic Acid -Ezetimibe  (NEXLIZET ) 180-10 MG TABS, TAKE 1 TABLET BY MOUTH DAILY, Disp: 90 tablet, Rfl: 3   Blood Glucose Monitoring Suppl DEVI, 1 each by Does not apply route in the morning, at noon, and at bedtime. May substitute to any manufacturer covered by patient's insurance., Disp: 1 each, Rfl: 0   Cholecalciferol  (VITAMIN D -3) 1000 UNITS CAPS, Take 1 capsule by mouth daily., Disp: , Rfl:    CRANBERRY PO, Take 1 tablet by mouth daily., Disp: , Rfl:    diclofenac  Sodium (VOLTAREN ) 1 % GEL, Apply 4 g topically 4 (four) times daily. To your chest wall, Disp: 350 g, Rfl: 1   erythromycin ophthalmic ointment, SMARTSIG:In Eye(s), Disp: , Rfl:    ferrous sulfate  325 (65 FE) MG tablet, Take 1 tablet (325 mg total) by mouth daily with breakfast., Disp: 30 tablet, Rfl: 2   fexofenadine  (ALLEGRA ) 180 MG tablet, Take 1 tablet (180 mg total) by mouth as needed for allergies or rhinitis., Disp: 90 tablet, Rfl: 0    furosemide  (LASIX ) 20 MG tablet, Take 1 tablet (20 mg total) by mouth daily as needed., Disp: 30 tablet, Rfl: 3   gabapentin  (NEURONTIN ) 100 MG capsule, Take 200 mg by mouth 2 (two) times daily., Disp: , Rfl:    glipiZIDE  (GLUCOTROL  XL) 5 MG 24 hr tablet, Take 1 tablet (5 mg total) by mouth daily with breakfast. for diabetes., Disp: 90 tablet, Rfl: 0   Glucose Blood (BLOOD GLUCOSE TEST STRIPS) STRP, 1 each by In Vitro route in the morning, at noon, and at bedtime. May substitute to any manufacturer covered by patient's insurance., Disp: 300 strip, Rfl: 5   Lancets Misc. MISC, 1 each by Does not apply route in the morning, at noon, and at bedtime. May substitute to any manufacturer covered by patient's insurance., Disp: 300 each, Rfl: 5   metoprolol  succinate (TOPROL -XL) 100 MG 24 hr tablet, TAKE 1 TABLET BY MOUTH EVERY MORNING WITH OR IMMEDIATELY FOLLOWING A MEAL, Disp: 90 tablet, Rfl: 3   nitroGLYCERIN  (NITROSTAT ) 0.4 MG SL tablet, Place 1 tablet (0.4 mg total) under the tongue every 5 (five) minutes as needed for chest pain., Disp: 25 tablet, Rfl: 2   ondansetron  (ZOFRAN -ODT) 4 MG disintegrating tablet, Take 1 tablet (4 mg total) by mouth every 8 (eight) hours as needed for nausea or vomiting., Disp: 90 tablet, Rfl: 0   oxybutynin  (DITROPAN -XL) 5 MG 24 hr tablet, Take 1 tablet (5 mg total) by mouth at bedtime. For overactive  bladder, Disp: 90 tablet, Rfl: 0   pantoprazole  (PROTONIX ) 40 MG tablet, Take 1 tablet (40 mg total) by mouth 2 (two) times daily. TAKE 1 TABLET BY MOUTH DAILY FOR HEARTBURN (Patient taking differently: Take 80 mg by mouth daily. TAKE 2 TABLET BY MOUTH DAILY FOR HEARTBURN in am), Disp: 60 tablet, Rfl: 12   sertraline  (ZOLOFT ) 100 MG tablet, TAKE 1 TABLET BY MOUTH DAILY FOR ANXIETY AND DEPRESSION, Disp: 90 tablet, Rfl: 0   vitamin B-12 (CYANOCOBALAMIN ) 1000 MCG tablet, Take 1,000 mcg by mouth every other day., Disp: , Rfl:    Medications ordered in this encounter:  Meds ordered  this encounter  Medications   amoxicillin -clavulanate (AUGMENTIN ) 875-125 MG tablet    Sig: Take 1 tablet by mouth 2 (two) times daily for 7 days.    Dispense:  14 tablet    Refill:  0    She can take augmentin     Supervising Provider:   LAMPTEY, PHILIP O [[id number]]     *If you need refills on other medications prior to your next appointment, please contact your pharmacy*  Follow-Up: Call back or seek an in-person evaluation if the symptoms worsen or if the condition fails to improve as anticipated.  [facility] [phone]  Other Instructions She has surgery scheduled for next week.  I have advised her to not start any antibiotics unless her symptoms worsen.  She needs to follow-up with surgeon on Monday to determine next course of action.   If you have been instructed to have an in-person evaluation today at a local Urgent Care facility, please use the link below. It will take you to a list of all of our available [facility], including address, phone number and hours of operation. Please do not delay care.  [facility]  If you or a family member do not have a primary care provider, use the link below to schedule a visit and establish care. When you choose a [facility] primary care physician or advanced practice provider, you gain a long-term partner in health. Find a Primary Care Provider  Learn more about [facility]'s in-office and virtual care options: [facility] - Get Care Now  "

## 2024-12-25 NOTE — Discharge Summary (Signed)
 ------------------------------------------------------------------------------- Attestation signed by [name] [name] Laine, MD at 12/26/24 [phone] I saw the patient and personally performed a history and physical exam.  I discussed the findings, assessment and plan with the resident and agree with the documentation in the note.  [name] [name]. Nanci, MD [facility] [id number], [address] [place], [place] [address] Phone: [phone] FAX: [phone] -------------------------------------------------------------------------------   Discharge Summary  Admit date: 12/18/2024  Discharge date and time: 12/25/24  Discharge to:  Home  Discharge Service: [facility])  Discharge Attending Physician: [name] [name] Nanci, MD  Discharge  Diagnoses: gastric GIST  Secondary Diagnosis: Active Problems:   * No active hospital problems. * Resolved Problems:   * No resolved hospital problems. *   OR Procedures:   UNLISTED LAPAROSCOPY PROCEDURE; STOMACH Date 12/18/2024 -------------------   Ancillary Procedures: none  Discharge Day Services: The patient was seen and examined by the surgery team on the day of discharge. Vital signs and laboratory values were stable and deemed appropriate for discharge. Surgical wounds were examined and found to be clean, dry, and intact. Discharge plan was discussed, instructions for home care were given, and all questions answered.  Subjective  No acute events overnight. Pain Controlled. No fever or chills. Tolerating stent diet (soft, small frequent meals). Ambulating with minimal assistance. Urinating w/o issues. Appropriate for discharge home today.  Objective  Patient Vitals for the past 8 hrs:  BP Temp Temp src Pulse Resp SpO2  12/25/24 1000 (!) 154/129 -- -- 86 -- --  12/25/24 0419 131/77 36.6 C (97.9 F) Oral 76 19 97 %   No intake/output data recorded.  Physical Exam: General: Cooperative, no distress, well  appearing Neuro: Alert, oriented, appropriate HEENT: Normocephalic, atraumatic Pulmonary: Normal work of breathing, equal bilateral chest rise Cardiovascular: Regular rate and rhythm Abdomen: Soft, less distended, appropriately tender to palpation. Surgical incisions well approximated.  Extremities: no peripheral edema  Hospital Course:  The patient is a 89 yo F, PMH HTN, afib (of AC s/t hematemesis), DM, now with gastric GIST s/p 1 mo Gleevec  (dc'd d/t SE of facial swelling). The patient was taken to the OR on 12/18/24 for a GIST tumor resection. Intra-operatively, Attempt at wedge resection laparoscopically led to a gastric channel that was not traversable endoscopically. As such a 12 mm we made the decision to proceed with distal gastrectomy. Reconstruction with an antecolic Billroth II gastrojejunostomy. She tolerated the procedure well, was extubated in the OR, and was taken to the PACU where she received routine postoperative care before being transferred to the floor. Overall, she did well postoperatively. She remained NPO for several days post-operatively as her abdominal exam initally demonstrated evidence of moderate distension with nausea but without vomiting. Ultimately, on 1/24, patient had return of bowel function and her diet was slowly advanced. At the time of discharge, she was tolerating a GI soft diet (soft, frequent, small meals). Her foley catheter was removed on 1/24 as she passed her trial of void on her initial attempt. Otherwise, her post-op course was complicated by mild delirium that she developed on 1/24. Delirium precautions were added and the patient returned to her baseline mental status by 1/26. Her pain was well-controlled with P.O. pain medication. She was found to be suitable for discharge home in the care of her family on 12/25/24.  Condition at Discharge: Improved Discharge Medications:    Medication List    PAUSE taking these medications    aspirin  81 MG tablet;  Wait  to take this until your doctor or other care  provider tells you to start again.; Discuss with prescribing provider as  patient is reportedly not taking this medication.; Commonly known as:  ECOTRIN  ELIQUIS  5 mg Tab; Wait to take this until your doctor or other care  provider tells you to start again.; Please follow-up with your  cardiologist who paused this medication before you were admitted to the  hospital, unrelated to your surgery. Please discuss when you need to  resume this medication with them.; Generic drug: apixaban   furosemide  20 MG tablet; Wait to take this until your doctor or other  care provider tells you to start again.; Please follow-up with your  primary care provider within 2 weeks of discharge. In the meantime, record  your blood pressure daily and bring this log to your appointment with  them. Your primary care provider will guide the timing of restarting this  medication. ; Commonly known as: LASIX   isosorbide  mononitrate 30 MG 24 hr tablet; Wait to take this until your  doctor or other care provider tells you to start again.; Please follow-up  with your primary care provider within 2 weeks of discharge. In the  meantime, record your blood pressure daily and bring this log to your  appointment with them. Your primary care provider will guide the timing of  restarting this medication. ; Commonly known as: IMDUR    START taking these medications    acetaminophen  160 mg/5 mL solution; Commonly known as: TYLENOL ; Take  20.3 mL (650 mg total) by mouth every six (6) hours for 14 days.  traMADol  50 mg tablet; Commonly known as: ULTRAM ; Take 1 tablet (50 mg  total) by mouth every six (6) hours as needed (severe) for pain.   CONTINUE taking these medications    albuterol  90 mcg/actuation inhaler; Commonly known as: PROVENTIL   HFA;VENTOLIN  HFA  blood sugar diagnostic Strp  brimonidine  0.2 % ophthalmic solution; Commonly known as: ALPHAGAN   cholecalciferol   (vitamin D3-25 mcg (1,000 unit)) 25 mcg (1,000 unit)  capsule  cranberry extract 200 mg Cap  erythromycin 5 mg/gram (0.5 %) ophthalmic ointment; Commonly known as:  ROMYCIN  ferrous sulfate  325 (65 FE) MG tablet  gabapentin  100 MG capsule; Commonly known as: NEURONTIN   glipiZIDE  5 MG 24 hr tablet; Commonly known as: GLUCOTROL  XL  lancets Misc  latanoprost 0.005 % ophthalmic solution; Commonly known as: XALATAN  metoPROLOL  succinate 100 MG 24 hr tablet; Commonly known as: Toprol -XL  NEXLIZET  180-10 mg Tab; Generic drug: bempedoic acid -ezetimibe   ondansetron  4 MG disintegrating tablet; Commonly known as: ZOFRAN -ODT  oxybutynin  5 MG 24 hr tablet; Commonly known as: DITROPAN -XL  pantoprazole  40 MG tablet; Commonly known as: Protonix   sertraline  100 MG tablet; Commonly known as: ZOLOFT    STOP taking these medications    amoxicillin -clavulanate 875-125 mg per tablet; Commonly known as:  AUGMENTIN    ASK your doctor about these medications    ascorbic acid (vitamin C) 500 MG tablet; Commonly known as: VITAMIN C  nitroglycerin  0.4 MG SL tablet; Commonly known as: NITROSTAT   vit A,C and E-lutein-minerals 1,000 unit-200 mg-60 unit-2 mg Tab;  Commonly known as: I-VITE/OCUVITE WITH LUTEIN    Pending Test Results:   Discharge Instructions: Activity:  Activity Instructions     Activity as tolerated        Diet: Diet Instructions     Discharge diet (specify)     Discharge Nutrition Therapy: International Dysphagia   Dysphagia Liquid Level: Thin Liquid, Level 0   Dysphagia  Food Level: Soft & Bite Sized, Level 6      Other Instructions: Other Instructions     Call MD for:  difficulty breathing, headache or visual disturbances     Call MD for:  persistent nausea or vomiting     Call MD for:  redness, tenderness, or signs of infection (pain, swelling, redness, odor or green/yellow discharge around incision site)     Call MD for:  severe uncontrolled pain     Call  MD for: Temperature > 38.5 Celsius ( > 101.3 Fahrenheit)     Discharge instructions     Discharge instructions     DIET: You may resume a soft diet of small, more frequent meals as tolerated. Nutritional supplements such as Boost, Ensure or [employer] are encouraged.  INCISION CARE: Look at your incisions at least twice a day. A small amount of drainage is normal. If your doctor put surgical glue on your incisions, it will fall off gradually by itself, usually about 7-10 days after surgery. Do not pull it off yourself. You can put gauze over the incisions for the first week to protect clothing from any drainage. Avoid wearing tight clothing.  BATHING: You may shower, but do not submerge wounds (eg bathtub, swimming pool) until cleared to do so at your follow-up appointment.  ACTIVITY: No lifting greater than 10 pounds or strenuous activity until seen by the doctor in your follow-up visit.  PAIN MEDICATION:  1. You SHOULD take 650 mg Tylenol  every 6 hours as needed in addition to, or instead of, narcotic pain medication. This will help you decrease your need for narcotics. Do NOT exceed 3,000 mg Tylenol  in 24 hours.  2. You SHOULD AVOID taking Advil/Motrin/ibuprofen after your surgery. 3. You may purchase over the counter lidocaine  patches (2 or 4%) to help with localized incisional pain. Use these according to package directions. 4. Do not drive or drink alcohol while taking pain medication.  5. Take your prescribed narcotic pain medication only as needed.  6. You should decrease the amount of narcotic pain medication as soon as possible. 7. Narcotic pain medication can cause constipation. To avoid this, you should take an over the counter stool softener (Colace). Some patients will also require a laxative, which you may also find available over the counter (MiraLax , Senna, Dulcolax, magesium citrate).  Please note that prescription pain medication refills will not be called into  your pharmacy. If you feel that you are needing more pain medication, you will need to be seen in clinic or the Emergency Department for further evaluation to ensure you are not experiencing a complication. Please take note of how many pills you have left in your bottle, and if you are starting to run low and feel that you will need more for adequate pain control, call the clinic as soon as possible to schedule an appointment. As our clinics are very busy, and we cannot guarantee same week appointments.   WHEN TO CALL THE SURGEON'S OFFICE: 1. Thick, yellow drainage from your incisions, redness at incisions, bleeding, or separations of wounds.  2. Temperature greater than 101.5 3. Uncontrolled nausea or vomiting. 4. Pain that is not controlled by your pain medication. 5. Inability to have a bowel movement for 3 or more consecutive days. 6. Jaundice (yellow eyes or skin). 7. Any new or concerning symptoms.    If you have problems or questions after discharge, please contact the following:   During business hours (Monday-Friday 8am-5pm): Contact the  [facility] Triage Line at [phone]. These calls will be triaged through a navigation system, and returned as soon as possible. Calls late in the business day may not be returned until the following business day.   For emergencies after-hours (after 5pm or weekends): call the [facility] operator [phone] and ask to page the General Surgery resident on call. You will be directed to a surgery resident who likely is not immediately aware of the details of your case, but can help you deal with any emergencies that cannot wait until regular business hours.  Please be aware that this person is responding to many in-hospital emergencies and patient issues and may not answer your phone call immediately, but will return your call as soon as possible. This individual may not provide you with medication refills.   Questions  regarding clinic appointments: [phone]    [facility] Patient Resource Center  The Patient & Family Resource Center is located in the lobby of the [facility] next to the Outpatient Pharmacy.  We offer a variety of support services to help as you and your caregivers continue to navigate your cancer treatment journey. We can connect you with social work, support groups, financial assistance, counseling, relaxation spaces, hair-loss boutique services, and much more.  You are welcome to call us  at [phone] or stop in Monday-Friday 7:30am-4pm.   No dressing needed     Patient to schedule follow-up appointment with surgeon in 1-2 weeks        Labs and Other Follow-ups after Discharge: Follow Up instructions and Outpatient Referrals    Call MD for:  difficulty breathing, headache or visual disturbances     Call MD for:  persistent nausea or vomiting     Call MD for:  redness, tenderness, or signs of infection (pain, swelling,  redness, odor or green/yellow discharge around incision site)     Call MD for:  severe uncontrolled pain     Call MD for: Temperature > 38.5 Celsius ( > 101.3 Fahrenheit)     Discharge instructions     Discharge instructions      Resources and Referrals     Walker     Type: Rolling   Wheels: 4 Wheels & Seat   Length of Need: lifetime   Pt is 5'6.5 and needs a rollator that can accommodate her height      Pt is 5'6.5 and needs a rollator that can accommodate her height      Future Appointments: Appointments which have been scheduled for you    Jan 02, 2025 4:15 PM (Arrive by 3:45 PM) ESTRELLA POST OP with [name] Dusty Gleason, MD [facility] SURGERY ONCOLOGY [place] [facility]) [address] [place] [address] [phone]

## 2024-12-26 ENCOUNTER — Telehealth: Payer: Self-pay

## 2024-12-26 NOTE — Transitions of Care (Post Inpatient/ED Visit) (Signed)
" ° °  12/26/2024  Name: [name] [name] MRN: [id number] DOB: 01-30-1932  Today's TOC FU Call Status: Today's TOC FU Call Status:: Unsuccessful Call (1st Attempt) Unsuccessful Call (1st Attempt) Date: 12/26/24  Attempted to reach the patient regarding the most recent Inpatient/ED visit.  Follow Up Plan: Additional outreach attempts will be made to reach the patient to complete the Transitions of Care (Post Inpatient/ED visit) call.   Arvin Seip RN, BSN, CCM [employer], Population Health Case Manager Phone: [phone]  "

## 2024-12-26 NOTE — Care Plan (Signed)
 Transition of Care Encounter Data   Call attempt: 1 Admission date: 12/18/24 Discharge date: 12/25/24 Discharge diagnosis: gastric GIST Do you have a hospital follow up appointment?: Yes - Within 14 Days, Yes with Specialty Provider clinic (Comment: @ 4:15 PM [facility] SURGERY ONCOLOGY) F/U Date: 01/02/25 F/U Provider: Ozell Gleason, MD Patient post discharge: Medications:         [facility]: [phone] H[facility]: [phone]:    Other: Contact PCP:     [facility] TRANSITIONAL CASE MANAGEMENT SUMMARY NOTE   Attempted to contact patient today at Cell to complete Transitional Case Management call from [facility]. Left message for patient to return call; direct phone number included in message left for patient; 1st attempt.    LOLITA MOLT, RN

## 2024-12-27 ENCOUNTER — Telehealth: Payer: Self-pay

## 2024-12-27 NOTE — Transitions of Care (Post Inpatient/ED Visit) (Signed)
" ° °  12/27/2024  Name: [name] [name] MRN: [id number] DOB: 1932/07/12  Today's TOC FU Call Status: Today's TOC FU Call Status:: Unsuccessful Call (2nd Attempt) Unsuccessful Call (1st Attempt) Date: 12/26/24 Unsuccessful Call (2nd Attempt) Date: 12/27/24  Attempted to reach the patient regarding the most recent Inpatient/ED visit.  Follow Up Plan: Additional outreach attempts will be made to reach the patient to complete the Transitions of Care (Post Inpatient/ED visit) call.   Medford Balboa, BSN, RN [place]  [facility] - Population Health RN Care Manager [phone]  "

## 2024-12-28 ENCOUNTER — Telehealth: Payer: Self-pay

## 2024-12-28 DIAGNOSIS — K219 Gastro-esophageal reflux disease without esophagitis: Secondary | ICD-10-CM

## 2024-12-28 NOTE — Patient Instructions (Signed)
 Visit Information  Thank you for taking time to visit with me today. Please don't hesitate to contact me if I can be of assistance to you.  Patient instructions:  monitor surgical site for infection symptoms.  Infection symptoms discussed.   notify surgeon if symptoms are present.  no lifting >10 lbs or bending.  follow up with providers as recommended and take medications as prescribed.   pat dry surgical areas after bathing.    Patient verbalizes understanding of instructions and care plan provided today and agrees to view in MyChart. Active MyChart status and patient understanding of how to access instructions and care plan via MyChart confirmed with patient.     The patient has been provided with contact information for the care management team and has been advised to call with any health related questions or concerns.   Please call the care guide team at [phone] if you need to cancel or reschedule your appointment.   Please call the Suicide and Crisis Lifeline: 988 call the [place]  National Suicide Prevention Lifeline: 551-797-4143 or TTY: 204-352-7572 TTY (305)103-3507) to talk to a trained counselor call 1-800-273-TALK (toll free, 24 hour hotline) if you are experiencing a Mental Health or Behavioral Health Crisis or need someone to talk to.  Arvin Seip RN, BSN, CCM [employer], Population Health Case Manager Phone: [phone]

## 2024-12-28 NOTE — Transitions of Care (Post Inpatient/ED Visit) (Signed)
 "  12/28/2024  Name: [name] [name] MRN: [id number] DOB: Sep 01, 1932  Today's TOC FU Call Status: Today's TOC FU Call Status:: Successful TOC FU Call Completed TOC FU Call Complete Date: 12/28/24  Patient's Name and Date of Birth confirmed. 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  Patient wants to know when she is able to eat a regular meal.  Advised patient to follow instructions provided by her surgeon regarding her food intake.  Patient states she has a post operative appointment with the surgeon on 01/02/25.  Advised patient to call surgeon office if she has any additional questions or concerns. Patient Questions/Concerns Addressed: Other:  Items Reviewed: Did you receive and understand the discharge instructions provided?: Yes Medications obtained,verified, and reconciled?: Yes (Medications Reviewed) Any new allergies since your discharge?: No Dietary orders reviewed?: Yes Type of Diet Ordered:: pureed  diet Do you have support at home?: Yes People in Home [RPT]: other relative(s) Name of Support/Comfort Primary Source: kelly Madren  Medications Reviewed Today: Medications Reviewed Today     Reviewed by Efrat Zuidema E, RN (Registered Nurse) on 12/28/24 at 1601  Med List Status: <None>   Medication Order Taking? Sig Documenting Provider Last Dose Status Informant  albuterol  (VENTOLIN  HFA) 108 (90 Base) MCG/ACT inhaler [id number] Yes Inhale 2 puffs into the lungs every 6 (six)  hours as needed for wheezing or shortness of breath. Blair, Diane W, FNP  Active   Bempedoic Acid -Ezetimibe  (NEXLIZET ) 180-10 MG TABS [id number] Yes TAKE 1 TABLET BY MOUTH DAILY Gollan, Timothy J, MD  Active   Blood Glucose Monitoring Suppl DEVI [id number] Yes 1 each by Does not apply route in the morning, at noon, and at bedtime. May substitute to any manufacturer covered by patient's insurance. [name], Katherine K, NP  Active Self  Cholecalciferol  (VITAMIN D -3) 1000 UNITS CAPS [id number]  Take 1 capsule by mouth daily.  Patient not taking: Reported on 12/28/2024   [provider]  Active Self  CRANBERRY PO [id number]  Take 1 tablet by mouth daily.  Patient not taking: Reported on 12/28/2024   [provider]  Active Self  diclofenac  Sodium (VOLTAREN ) 1 % GEL [id number]  Apply 4 g topically 4 (four) times daily. To your chest wall  Patient not taking: Reported on 12/28/2024   Amin, Sumayya, MD  Active   erythromycin ophthalmic ointment [id number] Yes SMARTSIG:In Eye(s) [provider]  Active   ferrous sulfate  325 (65 FE) MG tablet [id number]  Take 1 tablet (325 mg total) by mouth daily with breakfast.  Patient not taking: Reported on 12/28/2024   Kerman Vina HERO, NP  Active Self  fexofenadine  (ALLEGRA ) 180 MG tablet [id number] Yes Take 1 tablet (180 mg total) by mouth as needed for allergies or rhinitis. Gretta Comer POUR, NP  Active Self           Med Note CATHY OVAL DEL   Thu Jul 12, 2024  6:56 PM)    furosemide  (LASIX ) 20 MG tablet [id number] Yes Take 1 tablet (20 mg total) by mouth daily as needed. Gollan, Timothy J, MD  Active  gabapentin  (NEURONTIN ) 100 MG capsule [id number] Yes Take 200 mg by mouth 2 (two) times daily. [provider]  Active Self  glipiZIDE  (GLUCOTROL  XL) 5 MG 24 hr tablet [id number] Yes Take 1 tablet (5 mg total) by mouth daily with breakfast. for diabetes. [name], Katherine K, NP  Active   Glucose Blood (BLOOD GLUCOSE TEST STRIPS) STRP  [id number] Yes 1 each by In Vitro route in the morning, at noon, and at bedtime. May substitute to any manufacturer covered by patient's insurance. Gretta Comer POUR, NP  Active Self  Lancets Misc. MISC [id number] Yes 1 each by Does not apply route in the morning, at noon, and at bedtime. May substitute to any manufacturer covered by patient's insurance. Gretta Comer POUR, NP  Active Self    Discontinued 02/15/12 0800   metoprolol  succinate (TOPROL -XL) 100 MG 24 hr tablet [id number] Yes TAKE 1 TABLET BY MOUTH EVERY MORNING WITH OR IMMEDIATELY FOLLOWING A MEAL Gollan, Timothy J, MD  Active Self  nitroGLYCERIN  (NITROSTAT ) 0.4 MG SL tablet [id number] Yes Place 1 tablet (0.4 mg total) under the tongue every 5 (five) minutes as needed for chest pain. Gollan, Timothy J, MD  Active   ondansetron  (ZOFRAN -ODT) 4 MG disintegrating tablet [id number] Yes Take 1 tablet (4 mg total) by mouth every 8 (eight) hours as needed for nausea or vomiting. Brahmanday, Govinda R, MD  Active   oxybutynin  (DITROPAN -XL) 5 MG 24 hr tablet [id number] Yes Take 1 tablet (5 mg total) by mouth at bedtime. For overactive bladder [name], Katherine K, NP  Active   pantoprazole  (PROTONIX ) 40 MG tablet [id number] Yes Take 1 tablet (40 mg total) by mouth 2 (two) times daily. TAKE 1 TABLET BY MOUTH DAILY FOR HEARTBURN  Patient taking differently: Take 40 mg by mouth 2 (two) times daily. TAKE 2 TABLET BY MOUTH DAILY FOR HEARTBURN   Mansouraty, Gabriel Jr., MD  Active   sertraline  (ZOLOFT ) 100 MG tablet [id number] Yes TAKE 1 TABLET BY MOUTH DAILY FOR ANXIETY AND DEPRESSION [name], Katherine K, NP  Active   vitamin B-12 (CYANOCOBALAMIN ) 1000 MCG tablet [id number]  Take 1,000 mcg by mouth every other day.  Patient not taking: Reported on 12/28/2024   [provider]  Active Self  Med List Note Octaviano Dionicia CROME, [facility] 09/05/24 1116): Imatinib  filled through [facility] and Equipment/Supplies: Were Home  Health Services Ordered?: No Any new equipment or medical supplies ordered?: No  Functional Questionnaire: Do you need assistance with bathing/showering or dressing?: No Do you need assistance with meal preparation?: No Do you need assistance with eating?: No Do you have difficulty maintaining continence: No Do you need assistance with getting out of bed/getting out of a chair/moving?: No Do you have difficulty managing or taking your medications?: No  Follow up appointments reviewed: PCP Follow-up appointment confirmed?: No (offered to assist patient with scheduling hospital follow up visit with primary care provider. Patient declined stating she will call to schedule.) Specialist Hospital Follow-up appointment confirmed?: Yes Date of Specialist follow-up appointment?: 01/02/25 Follow-Up Specialty Provider:: Dr. Ozell Senters Do you need transportation to your follow-up appointment?: No Do you understand care options if your condition(s) worsen?: Yes-patient verbalized understanding  SDOH Interventions Today    Flowsheet Row Most Recent Value  SDOH Interventions   Food Insecurity Interventions Intervention Not Indicated  Housing Interventions Intervention Not Indicated  Transportation Interventions Intervention Not Indicated  Utilities Interventions Intervention Not Indicated  Discussed and offered 30 day TOC program.  Patient  declined services.  The patient has been provided with contact information for the care management team and has been advised to call with any health -related questions or concerns.  The patient verbalized understanding with current plan of care.  The patient is directed to their insurance card regarding availability of benefits coverage.    Arvin Seip RN, BSN, CCM [employer], Population Health Case Manager Phone: [phone]  "

## 2025-01-17 ENCOUNTER — Ambulatory Visit

## 2025-01-30 ENCOUNTER — Inpatient Hospital Stay

## 2025-01-30 ENCOUNTER — Inpatient Hospital Stay: Admitting: Internal Medicine

## 2025-02-19 ENCOUNTER — Ambulatory Visit: Admitting: Cardiovascular Disease
